# Patient Record
Sex: Female | Born: 1960 | Race: Black or African American | Hispanic: No | Marital: Married | Smoking: Never smoker
Health system: Southern US, Community
[De-identification: ages and names within clinical notes are randomized; demographics above are authoritative.]

## PROBLEM LIST (undated history)

## (undated) DIAGNOSIS — I10 Essential (primary) hypertension: Secondary | ICD-10-CM

## (undated) DIAGNOSIS — K219 Gastro-esophageal reflux disease without esophagitis: Secondary | ICD-10-CM

## (undated) DIAGNOSIS — H9190 Unspecified hearing loss, unspecified ear: Secondary | ICD-10-CM

## (undated) DIAGNOSIS — R1032 Left lower quadrant pain: Secondary | ICD-10-CM

## (undated) DIAGNOSIS — E079 Disorder of thyroid, unspecified: Secondary | ICD-10-CM

## (undated) DIAGNOSIS — J45909 Unspecified asthma, uncomplicated: Secondary | ICD-10-CM

## (undated) DIAGNOSIS — T4145XA Adverse effect of unspecified anesthetic, initial encounter: Secondary | ICD-10-CM

## (undated) DIAGNOSIS — T8859XA Other complications of anesthesia, initial encounter: Secondary | ICD-10-CM

## (undated) DIAGNOSIS — F32A Depression, unspecified: Secondary | ICD-10-CM

## (undated) DIAGNOSIS — E119 Type 2 diabetes mellitus without complications: Secondary | ICD-10-CM

## (undated) DIAGNOSIS — M1990 Unspecified osteoarthritis, unspecified site: Secondary | ICD-10-CM

## (undated) HISTORY — DX: Unspecified asthma, uncomplicated: J45.909

## (undated) HISTORY — DX: Type 2 diabetes mellitus without complications: E11.9

## (undated) HISTORY — DX: Unspecified hearing loss, unspecified ear: H91.90

## (undated) HISTORY — DX: Essential (primary) hypertension: I10

## (undated) HISTORY — PX: CARDIAC CATHETERIZATION: SHX172

## (undated) HISTORY — DX: Disorder of thyroid, unspecified: E07.9

---

## 2001-02-23 ENCOUNTER — Emergency Department (HOSPITAL_COMMUNITY): Admission: EM | Admit: 2001-02-23 | Discharge: 2001-02-23 | Payer: Self-pay | Admitting: Emergency Medicine

## 2001-11-13 ENCOUNTER — Other Ambulatory Visit: Admission: RE | Admit: 2001-11-13 | Discharge: 2001-11-13 | Payer: Self-pay | Admitting: Obstetrics and Gynecology

## 2001-11-18 ENCOUNTER — Encounter: Payer: Self-pay | Admitting: Obstetrics and Gynecology

## 2001-11-18 ENCOUNTER — Ambulatory Visit (HOSPITAL_COMMUNITY): Admission: RE | Admit: 2001-11-18 | Discharge: 2001-11-18 | Payer: Self-pay | Admitting: Obstetrics and Gynecology

## 2003-05-09 ENCOUNTER — Encounter: Payer: Self-pay | Admitting: Emergency Medicine

## 2003-05-09 ENCOUNTER — Emergency Department (HOSPITAL_COMMUNITY): Admission: EM | Admit: 2003-05-09 | Discharge: 2003-05-09 | Payer: Self-pay | Admitting: Emergency Medicine

## 2004-02-29 ENCOUNTER — Emergency Department (HOSPITAL_COMMUNITY): Admission: EM | Admit: 2004-02-29 | Discharge: 2004-03-01 | Payer: Self-pay | Admitting: Emergency Medicine

## 2005-03-11 ENCOUNTER — Emergency Department (HOSPITAL_COMMUNITY): Admission: EM | Admit: 2005-03-11 | Discharge: 2005-03-11 | Payer: Self-pay | Admitting: Emergency Medicine

## 2006-05-24 ENCOUNTER — Emergency Department (HOSPITAL_COMMUNITY): Admission: EM | Admit: 2006-05-24 | Discharge: 2006-05-25 | Payer: Self-pay | Admitting: Emergency Medicine

## 2006-06-19 ENCOUNTER — Encounter: Admission: RE | Admit: 2006-06-19 | Discharge: 2006-06-19 | Payer: Self-pay | Admitting: Orthopedic Surgery

## 2008-06-16 ENCOUNTER — Encounter: Admission: RE | Admit: 2008-06-16 | Discharge: 2008-06-16 | Payer: Self-pay | Admitting: Family Medicine

## 2010-11-27 ENCOUNTER — Encounter: Payer: Self-pay | Admitting: Orthopedic Surgery

## 2011-01-25 ENCOUNTER — Other Ambulatory Visit: Payer: Self-pay | Admitting: Family Medicine

## 2011-01-25 DIAGNOSIS — Z1231 Encounter for screening mammogram for malignant neoplasm of breast: Secondary | ICD-10-CM

## 2011-02-09 ENCOUNTER — Ambulatory Visit
Admission: RE | Admit: 2011-02-09 | Discharge: 2011-02-09 | Disposition: A | Discharge status: Home / Self Care | Payer: No Typology Code available for payment source | Source: Ambulatory Visit | Attending: Family Medicine | Admitting: Family Medicine

## 2011-02-09 DIAGNOSIS — Z1231 Encounter for screening mammogram for malignant neoplasm of breast: Secondary | ICD-10-CM

## 2011-02-13 ENCOUNTER — Ambulatory Visit: Payer: Self-pay | Admitting: *Deleted

## 2011-02-21 ENCOUNTER — Encounter: Payer: No Typology Code available for payment source | Attending: General Surgery | Admitting: *Deleted

## 2011-02-21 DIAGNOSIS — E669 Obesity, unspecified: Secondary | ICD-10-CM | POA: Insufficient documentation

## 2011-02-21 DIAGNOSIS — Z713 Dietary counseling and surveillance: Secondary | ICD-10-CM | POA: Insufficient documentation

## 2011-04-04 ENCOUNTER — Ambulatory Visit: Payer: No Typology Code available for payment source | Admitting: *Deleted

## 2011-04-10 ENCOUNTER — Encounter: Payer: No Typology Code available for payment source | Attending: General Surgery | Admitting: *Deleted

## 2011-04-10 DIAGNOSIS — Z713 Dietary counseling and surveillance: Secondary | ICD-10-CM | POA: Insufficient documentation

## 2011-04-10 DIAGNOSIS — E669 Obesity, unspecified: Secondary | ICD-10-CM | POA: Insufficient documentation

## 2012-01-17 ENCOUNTER — Encounter: Payer: Self-pay | Admitting: Internal Medicine

## 2012-01-17 ENCOUNTER — Ambulatory Visit (INDEPENDENT_AMBULATORY_CARE_PROVIDER_SITE_OTHER): Payer: No Typology Code available for payment source | Admitting: Internal Medicine

## 2012-01-17 VITALS — BP 160/100 | HR 72 | Temp 97.5°F | Ht 64.0 in | Wt 248.4 lb

## 2012-01-17 DIAGNOSIS — R0609 Other forms of dyspnea: Secondary | ICD-10-CM

## 2012-01-17 DIAGNOSIS — R0600 Dyspnea, unspecified: Secondary | ICD-10-CM

## 2012-01-17 NOTE — Progress Notes (Signed)
Subjective:    Patient ID: Rhonda Morrow, female    DOB: Feb 23, 1961, 51 y.o.   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 Here with interpreter.  reports that she has never smoked. She does not have any smokeless tobacco history on file. Body mass index is 42.64 kg/(m^2). Husband is patient of Dr Shelle Iron and has O2 dependency though is never smoker. She works in [employer]   Reports dyspnea. Especially with exertion walking or hurrying and walking and climbing steps. Stops half way through steps. Dyspnea also made worse by sitting in small cars. No dyspnea for eating or change of clothes. Relieved by rest and drinking water or sitting in large SUV/Van. Feels air hungry. INsidious onset. Slowly progressive. Says people can hear her dyspneic. Associated symptoms: 20# weight gain x 1 year,  occasional cough only during URI, wheezing, popping sounds occasionally in ear, hx of occasional URI and chest tightness. Says chest tightness improved by prevacid. Uses 3 pillows to sleep for years. Unclear if there is  paroxysmal nocturnal dyspnea or not. 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 Denies family hx of asthma. No pet birds. Has dog outside. No cats. No mold exposure but maybe in bathtub (not sure)  Walking desaturation test 185 feet x 3 laps"     Past Medical History  Diagnosis Date  . Pre-diabetes   . Hypertension   . Deaf      Family History  Problem Relation Age of Onset  . Diabetes Mother   . Diabetes Father   . Heart disease Mother      History   Social History  . Marital Status: Married    Spouse Name: N/A    Number of Children: N/A  . Years of Education: N/A   Occupational History  . diabled    Social History Main Topics  . Smoking status: Never Smoker   . Smokeless tobacco: Not on file  . Alcohol Use: No  . Drug Use: No  . Sexually Active: Not on  file   Other Topics Concern  . Not on file   Social History Narrative  . No narrative on file     No Known Allergies   No outpatient prescriptions prior to visit.        Review of Systems  Constitutional: Negative for fever and unexpected weight change.  HENT: Negative for ear pain, nosebleeds, congestion, sore throat, rhinorrhea, sneezing, trouble swallowing, dental problem, postnasal drip and sinus pressure.   Eyes: Negative for redness and itching.  Respiratory: Positive for cough, chest tightness and shortness of breath. Negative for wheezing.   Cardiovascular: Negative for palpitations and leg swelling.  Gastrointestinal: Negative for nausea and vomiting.  Genitourinary: Negative for dysuria.  Musculoskeletal: Negative for joint swelling.  Skin: Negative for rash.  Neurological: Negative for headaches.  Hematological: Does not bruise/bleed easily.  Psychiatric/Behavioral: Negative for dysphoric mood. The patient is not nervous/anxious.        Objective:   Physical Exam  Vitals reviewed. Constitutional: She is oriented to person, place, and time. She appears well-developed and well-nourished. No distress.       Obese Body mass index is 42.64 kg/(m^2).   HENT:  Head: Normocephalic and atraumatic.  Right Ear: External ear normal.  Left Ear: External ear normal.  Mouth/Throat: Oropharynx is clear and moist. No oropharyngeal exudate.  Eyes: Conjunctivae and EOM are normal. Pupils are equal, round, and reactive to light. Right eye exhibits no discharge. Left eye exhibits no discharge. No scleral icterus.  Neck: Normal range of motion. Neck supple. No JVD present. No tracheal deviation present. No thyromegaly present.  Cardiovascular: Normal rate, regular rhythm, normal heart sounds and intact distal pulses.  Exam reveals no gallop and no friction rub.   No murmur heard. Pulmonary/Chest: Effort normal and breath sounds normal. No respiratory distress. She has no  wheezes. She has no rales. She exhibits no tenderness.  Abdominal: Soft. Bowel sounds are normal. She exhibits no distension and no mass. There is no tenderness. There is no rebound and no guarding.       obese  Musculoskeletal: Normal range of motion. She exhibits no edema and no tenderness.  Lymphadenopathy:    She has no cervical adenopathy.  Neurological: She is alert and oriented to person, place, and time. She has normal reflexes. No cranial nerve deficit. She exhibits normal muscle tone. Coordination normal.  Skin: Skin is warm and dry. No rash noted. She is not diaphoretic. No erythema. No pallor.  Psychiatric: She has a normal mood and affect. Her behavior is normal. Judgment and thought content normal.          Assessment & Plan:

## 2012-01-17 NOTE — Patient Instructions (Signed)
Do not know why you are short of breath Please have breathing test called PFT BAsed on those test results I will call you and decide next step

## 2012-01-24 ENCOUNTER — Ambulatory Visit (INDEPENDENT_AMBULATORY_CARE_PROVIDER_SITE_OTHER): Payer: No Typology Code available for payment source | Admitting: Internal Medicine

## 2012-01-24 DIAGNOSIS — R0609 Other forms of dyspnea: Secondary | ICD-10-CM

## 2012-01-24 DIAGNOSIS — R0600 Dyspnea, unspecified: Secondary | ICD-10-CM

## 2012-01-24 LAB — PULMONARY FUNCTION TEST

## 2012-01-24 NOTE — Progress Notes (Signed)
PFT done today. 

## 2012-01-28 ENCOUNTER — Encounter: Payer: Self-pay | Admitting: Internal Medicine

## 2012-01-28 DIAGNOSIS — R0600 Dyspnea, unspecified: Secondary | ICD-10-CM | POA: Insufficient documentation

## 2012-01-28 NOTE — Assessment & Plan Note (Signed)
Do not know why you are short of breath Please have breathing test called PFT Based on those test results I will call you and decide next step

## 2012-01-31 ENCOUNTER — Telehealth: Payer: Self-pay | Admitting: Internal Medicine

## 2012-01-31 DIAGNOSIS — R0600 Dyspnea, unspecified: Secondary | ICD-10-CM

## 2012-01-31 DIAGNOSIS — R942 Abnormal results of pulmonary function studies: Secondary | ICD-10-CM

## 2012-01-31 NOTE — Telephone Encounter (Signed)
PFt date 01/24/12 shows mixed obstruction - restriction but greater restriction with low dlco   - fev1 1/48L/58%, Ratio 82, TLC 56,  DLCO 14/51%, No BD response  Please set up CT chest to assess for ILD and then followup

## 2012-02-01 NOTE — Telephone Encounter (Signed)
LMTCBx1 via relay. Carron Curie, CMA

## 2012-02-06 ENCOUNTER — Other Ambulatory Visit: Payer: No Typology Code available for payment source

## 2012-02-09 ENCOUNTER — Ambulatory Visit (INDEPENDENT_AMBULATORY_CARE_PROVIDER_SITE_OTHER)
Admission: RE | Admit: 2012-02-09 | Discharge: 2012-02-09 | Disposition: A | Discharge status: Home / Self Care | Payer: No Typology Code available for payment source | Source: Ambulatory Visit | Attending: Internal Medicine | Admitting: Internal Medicine

## 2012-02-09 DIAGNOSIS — R942 Abnormal results of pulmonary function studies: Secondary | ICD-10-CM

## 2012-02-09 DIAGNOSIS — R0600 Dyspnea, unspecified: Secondary | ICD-10-CM

## 2012-02-09 DIAGNOSIS — R0609 Other forms of dyspnea: Secondary | ICD-10-CM

## 2012-02-09 DIAGNOSIS — R0989 Other specified symptoms and signs involving the circulatory and respiratory systems: Secondary | ICD-10-CM

## 2012-02-12 NOTE — Telephone Encounter (Signed)
Pt aware. Rhonda Morrow, CMA  

## 2012-02-27 ENCOUNTER — Telehealth: Payer: Self-pay | Admitting: Internal Medicine

## 2012-02-27 NOTE — Telephone Encounter (Signed)
I LMTCBx1. I spoke with pt mother and advised of CT results and appt was set for 03-11-12 at 10:30am. Need to advise the pt as well. Carron Curie, CMA

## 2012-02-27 NOTE — Telephone Encounter (Signed)
I spoke with the pt and she is also aware of results and appt time and date.Carron Curie, CMA

## 2012-03-11 ENCOUNTER — Ambulatory Visit (INDEPENDENT_AMBULATORY_CARE_PROVIDER_SITE_OTHER): Payer: No Typology Code available for payment source | Admitting: Internal Medicine

## 2012-03-11 ENCOUNTER — Encounter: Payer: Self-pay | Admitting: Internal Medicine

## 2012-03-11 VITALS — BP 130/76 | HR 84 | Temp 97.5°F | Ht 64.0 in | Wt 253.8 lb

## 2012-03-11 DIAGNOSIS — R0609 Other forms of dyspnea: Secondary | ICD-10-CM

## 2012-03-11 DIAGNOSIS — R0600 Dyspnea, unspecified: Secondary | ICD-10-CM

## 2012-03-11 NOTE — Patient Instructions (Signed)
Please have methacholine challenge test for asthma We will call you with results If test positive, you will come in to start asthma treatment IF test negative, will refer you to pulmonary rehab for weight related shortness of breath  

## 2012-03-11 NOTE — Progress Notes (Signed)
Subjective:    Patient ID: Rhonda Morrow, female    DOB: [birth year], 51 y.o.   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 Here with interpreter.  reports that she has never smoked. She does not have any smokeless tobacco history on file. Body mass index is 42.64 kg/(m^2). Husband is patient of Dr Shelle Iron and has O2 dependency though is never smoker. She works in [employer]   Reports dyspnea. Especially with exertion walking or hurrying and walking and climbing steps. Stops half way through steps. Dyspnea also made worse by sitting in small cars. No dyspnea for eating or change of clothes. Relieved by rest and drinking water or sitting in large SUV/Van. Feels air hungry. INsidious onset. Slowly progressive. Says people can hear her dyspneic. Associated symptoms: 20# weight gain x 1 year,  occasional cough only during URI, wheezing, popping sounds occasionally in ear, hx of occasional URI and chest tightness. Says chest tightness improved by prevacid. Uses 3 pillows to sleep for years. Unclear if there is  paroxysmal nocturnal dyspnea or not. 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 Denies family hx of asthma. No pet birds. Has dog outside. No cats. No mold exposure but maybe in bathtub (not sure)  Walking desaturation test 185 feet x 3 laps"   REC Do not know why you are short of breath  Please have breathing test called PFT  BAsed on those test results I will call you and decide next step   OV 03/11/2012 Here to review test results. Interpreter is Rebecca [place]; patient deaf and mute  PFt date 01/24/12 shows mixed obstruction - restriction but greater restriction with low dlco   - fev1 1/48L/58%, Ratio 82, TLC 56,  DLCO 14/51%, No BD response  CT chest 02/09/12:  The ct chest does not show any scarring.     In terms of symptoms: current wet, cold, rainy weather bothering her  but only mildly - more sneezing. IN terms of dyspnea: still present especially with stairs, walking fast, being in a rush. Improved with rest and drinking water. Slowly getting worse. DEnies current cough. No other new problems. Also, feeling of fullness and dyspnea after eating. There is also some chest tightnes and hurt when yawnining; improves with water. Lot of questions on why she is dyspneic incluing if obesity is contributing.   Past, Family, Social reviewed: no change since last visit     Review of Systems  Constitutional: Negative for fever and unexpected weight change.  HENT: Negative for ear pain, nosebleeds, congestion, sore throat, rhinorrhea, sneezing, trouble swallowing, dental problem, postnasal drip and sinus pressure.   Eyes: Negative for redness and itching.  Respiratory: Positive for shortness of breath. Negative for cough, chest tightness and wheezing.   Cardiovascular: Negative for palpitations and leg swelling.  Gastrointestinal: Negative for nausea and vomiting.  Genitourinary: Negative for dysuria.  Musculoskeletal: Negative for joint swelling.  Skin: Negative for rash.  Neurological: Negative for headaches.  Hematological: Does not bruise/bleed easily.  Psychiatric/Behavioral: Negative for dysphoric mood. The patient is not nervous/anxious.        Objective:   Physical Exam Vitals reviewed. Constitutional: She is oriented to person, place, and time. She appears well-developed and well-nourished. No distress.       Obese Body mass index is 42.64 kg/(m^2).   HENT:  Head: Normocephalic and atraumatic.  Right Ear: External ear normal.  Left Ear: External ear normal.  Mouth/Throat: Oropharynx is clear and moist. No oropharyngeal exudate.  Eyes: Conjunctivae and EOM are normal. Pupils are equal, round, and reactive to light. Right eye exhibits no discharge. Left eye exhibits no discharge. No scleral icterus.  Neck: Normal range of motion. Neck supple. No JVD  present. No tracheal deviation present. No thyromegaly present.  Cardiovascular: Normal rate, regular rhythm, normal heart sounds and intact distal pulses.  Exam reveals no gallop and no friction rub.   No murmur heard. Pulmonary/Chest: Effort normal and breath sounds normal. No respiratory distress. She has no wheezes. She has no rales. She exhibits no tenderness.  Abdominal: Soft. Bowel sounds are normal. She exhibits no distension and no mass. There is no tenderness. There is no rebound and no guarding.       obese  Musculoskeletal: Normal range of motion. She exhibits no edema and no tenderness.  Lymphadenopathy:    She has no cervical adenopathy.  Neurological: She is alert and oriented to person, place, and time. She has normal reflexes. No cranial nerve deficit. She exhibits normal muscle tone. Coordination normal.  Skin: Skin is warm and dry. No rash noted. She is not diaphoretic. No erythema. No pallor.  Psychiatric: She has a normal mood and affect. Her behavior is normal. Judgment and thought content normal.           Assessment & Plan:

## 2012-03-14 NOTE — Assessment & Plan Note (Signed)
Please have methacholine challenge test for asthma We will call you with results If test positive, you will come in to start asthma treatment IF test negative, will refer you to pulmonary rehab for weight related shortness of breath

## 2012-03-19 ENCOUNTER — Ambulatory Visit (HOSPITAL_COMMUNITY)
Admission: RE | Admit: 2012-03-19 | Discharge: 2012-03-19 | Disposition: A | Discharge status: Home / Self Care | Payer: No Typology Code available for payment source | Source: Ambulatory Visit | Attending: Internal Medicine | Admitting: Internal Medicine

## 2012-03-19 DIAGNOSIS — R0600 Dyspnea, unspecified: Secondary | ICD-10-CM

## 2012-03-19 DIAGNOSIS — R0609 Other forms of dyspnea: Secondary | ICD-10-CM | POA: Insufficient documentation

## 2012-03-19 DIAGNOSIS — R0989 Other specified symptoms and signs involving the circulatory and respiratory systems: Secondary | ICD-10-CM | POA: Insufficient documentation

## 2012-03-19 LAB — PULMONARY FUNCTION TEST

## 2012-03-19 MED ORDER — SODIUM CHLORIDE 0.9 % IN NEBU
3.0000 mL | INHALATION_SOLUTION | Freq: Once | RESPIRATORY_TRACT | Status: AC
  Administered 2012-03-19: 3 mL via RESPIRATORY_TRACT

## 2012-03-19 MED ORDER — METHACHOLINE 16 MG/ML NEB SOLN: 2.0000 mL | Freq: Once | RESPIRATORY_TRACT | Status: DC

## 2012-03-19 MED ORDER — METHACHOLINE 0.25 MG/ML NEB SOLN
2.0000 mL | Freq: Once | RESPIRATORY_TRACT | Status: AC
  Administered 2012-03-19: 0.5 mg via RESPIRATORY_TRACT

## 2012-03-19 MED ORDER — METHACHOLINE 4 MG/ML NEB SOLN: 2.0000 mL | Freq: Once | RESPIRATORY_TRACT | Status: DC

## 2012-03-19 MED ORDER — ALBUTEROL SULFATE (5 MG/ML) 0.5% IN NEBU
2.5000 mg | INHALATION_SOLUTION | Freq: Once | RESPIRATORY_TRACT | Status: AC
  Administered 2012-03-19: 2.5 mg via RESPIRATORY_TRACT

## 2012-03-19 MED ORDER — METHACHOLINE 0.0625 MG/ML NEB SOLN
2.0000 mL | Freq: Once | RESPIRATORY_TRACT | Status: AC
  Administered 2012-03-19: 0.125 mg via RESPIRATORY_TRACT

## 2012-03-19 MED ORDER — METHACHOLINE 1 MG/ML NEB SOLN: 2.0000 mL | Freq: Once | RESPIRATORY_TRACT | Status: DC

## 2012-04-02 ENCOUNTER — Telehealth: Payer: Self-pay | Admitting: Internal Medicine

## 2012-04-02 NOTE — Telephone Encounter (Signed)
pls schedule appt first available  (max 12-14 per day) to see me to discuss positive methachole challenge test ofr 03/19/12   Test details for my perusal   - test done 03/19/12 - good cooperation per RT. 24% Fev1 drop and 17% FVC dro[[ at dose 0/25

## 2012-04-03 NOTE — Telephone Encounter (Signed)
Pt scheduled for 04-18-12. Rhonda Morrow, CMA

## 2012-04-03 NOTE — Telephone Encounter (Signed)
lmomtcb x1 

## 2012-04-03 NOTE — Telephone Encounter (Signed)
LMTCBx1.Astaria Nanez, CMA  

## 2012-04-03 NOTE — Telephone Encounter (Signed)
Pt returned call.  Call back @ [phone] Leanora Ivanoff

## 2012-04-18 ENCOUNTER — Encounter: Payer: Self-pay | Admitting: Internal Medicine

## 2012-04-18 ENCOUNTER — Ambulatory Visit (INDEPENDENT_AMBULATORY_CARE_PROVIDER_SITE_OTHER): Payer: No Typology Code available for payment source | Admitting: Internal Medicine

## 2012-04-18 VITALS — BP 132/88 | HR 80 | Temp 97.6°F | Ht 64.0 in | Wt 251.6 lb

## 2012-04-18 DIAGNOSIS — R0600 Dyspnea, unspecified: Secondary | ICD-10-CM

## 2012-04-18 DIAGNOSIS — E669 Obesity, unspecified: Secondary | ICD-10-CM

## 2012-04-18 DIAGNOSIS — J4530 Mild persistent asthma, uncomplicated: Secondary | ICD-10-CM

## 2012-04-18 DIAGNOSIS — R0609 Other forms of dyspnea: Secondary | ICD-10-CM

## 2012-04-18 DIAGNOSIS — R0989 Other specified symptoms and signs involving the circulatory and respiratory systems: Secondary | ICD-10-CM

## 2012-04-18 DIAGNOSIS — J45909 Unspecified asthma, uncomplicated: Secondary | ICD-10-CM

## 2012-04-18 MED ORDER — BUDESONIDE-FORMOTEROL FUMARATE 80-4.5 MCG/ACT IN AERO: 2.0000 | INHALATION_SPRAY | Freq: Two times a day (BID) | RESPIRATORY_TRACT | Status: DC

## 2012-04-18 NOTE — Progress Notes (Signed)
Subjective:    Patient ID: Rhonda Morrow, female    DOB: 06-18-1961, 50 y.o.   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 Here with interpreter.  reports that she has never smoked. She does not have any smokeless tobacco history on file. Body mass index is 42.64 kg/(m^2). Husband is patient of Dr Shelle Iron and has O2 dependency though is never smoker. She works in [employer]   Reports dyspnea. Especially with exertion walking or hurrying and walking and climbing steps. Stops half way through steps. Dyspnea also made worse by sitting in small cars. No dyspnea for eating or change of clothes. Relieved by rest and drinking water or sitting in large SUV/Van. Feels air hungry. INsidious onset. Slowly progressive. Says people can hear her dyspneic. Associated symptoms: 20# weight gain x 1 year,  occasional cough only during URI, wheezing, popping sounds occasionally in ear, hx of occasional URI and chest tightness. Says chest tightness improved by prevacid. Uses 3 pillows to sleep for years. Unclear if there is  paroxysmal nocturnal dyspnea or not. 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 Denies family hx of asthma. No pet birds. Has dog outside. No cats. No mold exposure but maybe in bathtub (not sure)  Walking desaturation test 185 feet x 3 laps"   REC Do not know why you are short of breath  Please have breathing test called PFT  BAsed on those test results I will call you and decide next step   OV 03/11/2012 Here to review test results. Interpreter is Rebecca [place]; patient deaf and mute  PFt date 01/24/12 shows mixed obstruction - restriction but greater restriction with low dlco   - fev1 1/48L/58%, Ratio 82, TLC 56,  DLCO 14/51%, No BD response  CT chest 02/09/12:  The ct chest does not show any scarring.     In terms of symptoms: current wet, cold, rainy weather bothering her  but only mildly - more sneezing. IN terms of dyspnea: still present especially with stairs, walking fast, being in a rush. Improved with rest and drinking water. Slowly getting worse. DEnies current cough. No other new problems. Also, feeling of fullness and dyspnea after eating. There is also some chest tightnes and hurt when yawnining; improves with water. Lot of questions on why she is dyspneic incluing if obesity is contributing.   Past, Family, Social reviewed: no change since last visit   REC  Please have methacholine challenge test for asthma  We will call you with results  If test positive, you will come in to start asthma treatment  IF test negative, will refer you to pulmonary rehab for weight related shortness of breath   OV 04/18/2012  Here to discuss results of methacholine challenge.No interim complaints. INterepreter is Lurena Joiner  positive methachole challenge test ofr 03/19/12  - good cooperation per RT. 24% Fev1 drop and 17% FVC drop at dose 0.25    Current outpatient prescriptions:naproxen (NAPROSYN) 500 MG tablet, Take 1 tablet by mouth Twice daily as needed., Disp: , Rfl: ;  naproxen sodium (ANAPROX) 220 MG tablet, Take 220 mg by mouth 2 (two) times daily with a meal., Disp: , Rfl:      Review of Systems  Constitutional: Negative for fever and unexpected weight change.  HENT: Negative for ear pain, nosebleeds, congestion, sore throat, rhinorrhea, sneezing, trouble swallowing, dental problem, postnasal  drip and sinus pressure.   Eyes: Negative for redness and itching.  Respiratory: Positive for shortness of breath. Negative for cough, chest tightness and wheezing.   Cardiovascular: Negative for palpitations and leg swelling.  Gastrointestinal: Negative for nausea and vomiting.  Genitourinary: Negative for dysuria.  Musculoskeletal: Negative for joint swelling.  Skin: Negative for rash.  Neurological: Negative for headaches.  Hematological: Does not bruise/bleed  easily.  Psychiatric/Behavioral: Negative for dysphoric mood. The patient is not nervous/anxious.        Objective:   Physical Exam Vitals reviewed. Constitutional: She is oriented to person, place, and time. She appears well-developed and well-nourished. No distress.       Obese Body mass index is 42.64 kg/(m^2).   HENT: Psychiatric: She has a normal mood and affect. Her behavior is normal. Judgment and thought content normal.            Assessment & Plan:

## 2012-04-18 NOTE — Patient Instructions (Addendum)
#  Shortness of breath  - this is due to asthma and weight  - for asthma Please start symbicort 80/4.5 2 puff twice daily - take sample, and show technique (script with refills done)  - I have referred you to pulmonary rehabilitation for shortness of breath exercises  #Weight management - I will discuss diet with you next visit  #Followup 6-8 weeks or sooner if needed

## 2012-04-19 ENCOUNTER — Encounter: Payer: Self-pay | Admitting: Internal Medicine

## 2012-04-19 DIAGNOSIS — J4540 Moderate persistent asthma, uncomplicated: Secondary | ICD-10-CM | POA: Insufficient documentation

## 2012-04-19 NOTE — Assessment & Plan Note (Signed)
Start symbicort

## 2012-04-19 NOTE — Assessment & Plan Note (Signed)
#  Shortness of breath  - this is due to asthma and weight  - for asthma Please start symbicort 80/4.5 2 puff twice daily - take sample, and show technique (script with refills done)  - I have referred you to pulmonary rehabilitation for shortness of breath exercises  #Weight management - I will discuss diet with you next visit  #Followup 6-8 weeks or sooner if needed  

## 2012-05-22 ENCOUNTER — Encounter: Payer: Self-pay | Admitting: Family Medicine

## 2012-05-22 ENCOUNTER — Ambulatory Visit (INDEPENDENT_AMBULATORY_CARE_PROVIDER_SITE_OTHER): Payer: No Typology Code available for payment source | Admitting: Family Medicine

## 2012-05-22 VITALS — BP 135/75 | HR 73 | Temp 98.5°F | Ht 63.25 in | Wt 246.0 lb

## 2012-05-22 DIAGNOSIS — M549 Dorsalgia, unspecified: Secondary | ICD-10-CM

## 2012-05-22 DIAGNOSIS — E049 Nontoxic goiter, unspecified: Secondary | ICD-10-CM

## 2012-05-22 DIAGNOSIS — E010 Iodine-deficiency related diffuse (endemic) goiter: Secondary | ICD-10-CM

## 2012-05-22 DIAGNOSIS — R7309 Other abnormal glucose: Secondary | ICD-10-CM

## 2012-05-22 DIAGNOSIS — J45909 Unspecified asthma, uncomplicated: Secondary | ICD-10-CM

## 2012-05-22 DIAGNOSIS — J4530 Mild persistent asthma, uncomplicated: Secondary | ICD-10-CM

## 2012-05-22 MED ORDER — ALBUTEROL SULFATE HFA 108 (90 BASE) MCG/ACT IN AERS: 2.0000 | INHALATION_SPRAY | Freq: Four times a day (QID) | RESPIRATORY_TRACT | Status: DC | PRN

## 2012-05-22 MED ORDER — CYCLOBENZAPRINE HCL 10 MG PO TABS: 10.0000 mg | ORAL_TABLET | Freq: Three times a day (TID) | ORAL | Status: DC | PRN

## 2012-05-22 NOTE — Patient Instructions (Addendum)
We'll call you with your ultrasound appt and your ortho appt Start the Albuterol inhaler- 2 puffs as needed for shortness of breath or wheezing Continue the Symbicort Use the flexeril (muscle relaxer) at night for your back pain Once we review your records from GYN we'll determine when you need to follow up Call with any questions or concerns Welcome!  We're glad to have you!!!

## 2012-05-22 NOTE — Assessment & Plan Note (Signed)
New.  Pt's pain seems to be more muscular than bony but difficult to get accurate history.  Start flexeril as pt reports pain is worst when lying down.  Continue NSAIDs.  Refer to ortho.  Reviewed supportive care and red flags that should prompt return.  Pt expressed understanding and is in agreement w/ plan.

## 2012-05-22 NOTE — Assessment & Plan Note (Signed)
New to provider.  Following w/ pulm.  Still complains of SOB w/ activity.  Stressed importance of regular exercise to improve physical conditioning.  Will start rescue inhaler for prn use.

## 2012-05-22 NOTE — Assessment & Plan Note (Signed)
New.  Pt has upcoming appt w/ Endo at request of GYN.  Attempting to get recent labs.  Will follow along and assist as able.

## 2012-05-22 NOTE — Progress Notes (Signed)
  Subjective:    Patient ID: Rhonda Morrow, female    DOB: 1961/03/15, 51 y.o.   MRN: [id number]  HPI New to establish.  No previous PCP.  Has appt w/ Dr Everardo All next week b/c sugar was elevated at GYN.  Pulm- Ramaswamy.  GYN- Mody.  Asthma- chronic problem, on Symbicort 2 puffs BID.  No rescue inhaler.  But having SOB w/ climbing stairs or activity.  No appt upcoming.  Denies SOB at night.  Elevated glucose- found at GYN office.  Has appt upcoming w/ Dr Everardo All. Unclear if cholesterol was checked.  Not exercising.  Back pain- chronic problem, low back.  Sometimes brings tears.  Very uncomfortable to sleep.  Taking Aleve w/out relief.  Pain is intermittent but has been worsening.  Worse in the AM.  Pain will radiate up back.  Denies weakness of arms or legs.  Morning stiffness.  Worse when lying on her side- has to sleep on back.   Pain improves w/ sitting, walking.  Very difficult hx to obtain even w/ sign language interpreter present  Review of Systems For ROS see HPI     Objective:   Physical Exam  Vitals reviewed. Constitutional: She appears well-developed and well-nourished. No distress.       obese  HENT:  Head: Normocephalic and atraumatic.  Neck: Normal range of motion. Neck supple. Thyromegaly (diffuse fullness w/out nodularity) present.  Cardiovascular: Normal rate, regular rhythm, normal heart sounds and intact distal pulses.   No murmur heard. Pulmonary/Chest: Effort normal and breath sounds normal. No respiratory distress. She has no wheezes. She has no rales.  Abdominal: Soft. Bowel sounds are normal. She exhibits no distension. There is no tenderness.  Musculoskeletal: She exhibits no edema.       Tenderness to palpation of paraspinal muscles running length of spine (-) SLR bilaterally Normal gait  Lymphadenopathy:    She has no cervical adenopathy.  Neurological: She is alert. No cranial nerve deficit. Coordination normal.       deaf          Assessment & Plan:

## 2012-05-22 NOTE — Assessment & Plan Note (Signed)
New.  Pt not aware of this.  Get [place] to assess.  Review recent labs- get TSH if one not done.

## 2012-05-27 ENCOUNTER — Ambulatory Visit (INDEPENDENT_AMBULATORY_CARE_PROVIDER_SITE_OTHER): Payer: No Typology Code available for payment source | Admitting: Endocrinology

## 2012-05-27 ENCOUNTER — Encounter: Payer: Self-pay | Admitting: Endocrinology

## 2012-05-27 VITALS — BP 138/88 | HR 73 | Temp 98.3°F | Wt 246.0 lb

## 2012-05-27 DIAGNOSIS — R7309 Other abnormal glucose: Secondary | ICD-10-CM

## 2012-05-27 NOTE — Patient Instructions (Addendum)
Please sign a release of information for the blood tests done by dr mody. i'll call the relay operator when i have results.   (update:  Pt is called.  tsh is normal.  i'll await results of thyroid ultrasound.  A1c=6.4.  Some advise medication in this situation)

## 2012-05-27 NOTE — Progress Notes (Signed)
Subjective:    Patient ID: Rhonda Morrow, female    DOB: Apr 03, 1961, 51 y.o.   MRN: [id number]  HPI Pt says she was recently told her blood sugar was abnormal.   She also says she was noted to have thyromegaly.  She says in retrospect, she has a few mos of slight swelling at the anterior neck.  No assoc pain. Past Medical History  Diagnosis Date  . Pre-diabetes   . Hypertension   . Deaf   . Asthma     Past Surgical History  Procedure Date  . Cesarean section     History   Social History  . Marital Status: Married    Spouse Name: N/A    Number of Children: N/A  . Years of Education: N/A   Occupational History  . diabled    Social History Main Topics  . Smoking status: Never Smoker   . Smokeless tobacco: Not on file  . Alcohol Use: No  . Drug Use: No  . Sexually Active: Not on file   Other Topics Concern  . Not on file   Social History Narrative  . No narrative on file    Current Outpatient Prescriptions on File Prior to Visit  Medication Sig Dispense Refill  . albuterol (PROVENTIL HFA;VENTOLIN HFA) 108 (90 BASE) MCG/ACT inhaler Inhale 2 puffs into the lungs every 6 (six) hours as needed for wheezing.  1 Inhaler  0  . budesonide-formoterol (SYMBICORT) 80-4.5 MCG/ACT inhaler Inhale 2 puffs into the lungs 2 (two) times daily.  1 Inhaler  12  . norethindrone (AYGESTIN) 5 MG tablet Take 5 mg by mouth daily.         No Known Allergies  Family History  Problem Relation Age of Onset  . Diabetes Mother   . Diabetes Father   . Heart disease Mother   no goiter or other thyroid probs.    BP 138/88  Pulse 73  Temp 98.3 F (36.8 C) (Oral)  Wt 246 lb (111.585 kg)  SpO2 99%  LMP 05/20/2012  Review of Systems denies weight loss, headache, double vision, palpitations, diarrhea, polyuria, myalgias, numbness, tremor, anxiety, hypoglycemia, easy bruising, and rhinorrhea.  She is able to speak very little.  She has irreg menses, excessive diaphoresis, and doe.      Objective:   Physical Exam VS: see vs page GEN: no distress.  Obese. HEAD: head: no deformity eyes: no periorbital swelling, no proptosis external nose and ears are normal mouth: no lesion seen NECK: supple, thyroid is not enlarged CHEST WALL: no deformity LUNGS:  Clear to auscultation CV: reg rate and rhythm, no murmur ABD: abdomen is soft, nontender.  no hepatosplenomegaly.  not distended.  no hernia MUSCULOSKELETAL: muscle bulk and strength are grossly normal.  no obvious joint swelling.  gait is normal and steady EXTEMITIES: no deformity.  no ulcer on the feet.  feet are of normal color and temp.  no edema PULSES: dorsalis pedis intact bilat.  no carotid bruit NEURO:  cn 2-12 grossly intact.   readily moves all 4's.  sensation is intact to touch on the feet.  No tremor.   SKIN:  Normal texture and temperature.  No rash or suspicious lesion is visible.  Not diaphoretic. NODES:  None palpable at the neck.   PSYCH: alert, oriented x3.  Does not appear anxious nor depressed.    outside test results are reviewed: A1c=6.4% TSH=0.91    Assessment & Plan:  DM.  New Goiter as reported, uncertain etiology  irreg menses, not thyroid-related

## 2012-05-28 ENCOUNTER — Ambulatory Visit: Payer: No Typology Code available for payment source | Admitting: Internal Medicine

## 2012-05-28 ENCOUNTER — Telehealth: Payer: Self-pay | Admitting: Endocrinology

## 2012-05-28 NOTE — Telephone Encounter (Signed)
please call patient: Thyroid blood test is normal.  i'll look forward to the ultrasound result Blood sugar is 6.4.  At this level, some recommend medication, but you could also just work on your diet and exercise.

## 2012-05-29 ENCOUNTER — Encounter: Payer: Self-pay | Admitting: *Deleted

## 2012-05-29 ENCOUNTER — Telehealth: Payer: Self-pay | Admitting: *Deleted

## 2012-05-29 ENCOUNTER — Ambulatory Visit (HOSPITAL_BASED_OUTPATIENT_CLINIC_OR_DEPARTMENT_OTHER)
Admission: RE | Admit: 2012-05-29 | Discharge: 2012-05-29 | Disposition: A | Discharge status: Home / Self Care | Payer: No Typology Code available for payment source | Source: Ambulatory Visit | Attending: Family Medicine | Admitting: Family Medicine

## 2012-05-29 DIAGNOSIS — E010 Iodine-deficiency related diffuse (endemic) goiter: Secondary | ICD-10-CM

## 2012-05-29 DIAGNOSIS — R220 Localized swelling, mass and lump, head: Secondary | ICD-10-CM | POA: Insufficient documentation

## 2012-05-29 DIAGNOSIS — E049 Nontoxic goiter, unspecified: Secondary | ICD-10-CM | POA: Insufficient documentation

## 2012-05-29 DIAGNOSIS — E041 Nontoxic single thyroid nodule: Secondary | ICD-10-CM

## 2012-05-29 NOTE — Telephone Encounter (Signed)
Called pt to inform of lab results, left message for pt to callback office.  

## 2012-05-29 NOTE — Telephone Encounter (Signed)
Pt informed of results.

## 2012-05-29 NOTE — Telephone Encounter (Signed)
Placed referral in chart for ENT due to thyroid nodule Spoke to pt answering service that had pt verify DOB and message was relayed as to results of [place], pt reported understanding and is aware that someone will call her with the apt for the ENT asap, pt agreed. letter mailed to patients home address with results.

## 2012-06-11 ENCOUNTER — Telehealth: Payer: Self-pay | Admitting: *Deleted

## 2012-06-11 ENCOUNTER — Other Ambulatory Visit: Payer: Self-pay | Admitting: Otolaryngology

## 2012-06-11 DIAGNOSIS — E041 Nontoxic single thyroid nodule: Secondary | ICD-10-CM

## 2012-06-11 NOTE — Telephone Encounter (Signed)
Called to advise that pt is in office currently and needs to have copy of recent TSH, advised pt was new to establish on 05-22-12 however no TSH has been drawn by our office nor any epic office, advised pt noted labs with GYN Sandelson per pt on 05-12-12, Misty Stanley will follow up with GYN office, per our office did send ENT referral however unable to access any labs with GYN, Misty Stanley will contact GYN MD office to receive labs

## 2012-06-13 ENCOUNTER — Ambulatory Visit (INDEPENDENT_AMBULATORY_CARE_PROVIDER_SITE_OTHER): Payer: No Typology Code available for payment source | Admitting: Family Medicine

## 2012-06-13 ENCOUNTER — Encounter: Payer: Self-pay | Admitting: Family Medicine

## 2012-06-13 VITALS — BP 132/77 | HR 84 | Temp 97.8°F | Ht 63.5 in | Wt 247.8 lb

## 2012-06-13 DIAGNOSIS — J4530 Mild persistent asthma, uncomplicated: Secondary | ICD-10-CM

## 2012-06-13 DIAGNOSIS — M549 Dorsalgia, unspecified: Secondary | ICD-10-CM

## 2012-06-13 DIAGNOSIS — J45909 Unspecified asthma, uncomplicated: Secondary | ICD-10-CM

## 2012-06-13 MED ORDER — CYCLOBENZAPRINE HCL 10 MG PO TABS: 10.0000 mg | ORAL_TABLET | Freq: Three times a day (TID) | ORAL | Status: DC | PRN

## 2012-06-13 NOTE — Patient Instructions (Addendum)
Go see Ortho as scheduled on 8/16 for your back Start the Flexeril nightly- during the day as needed but it may cause drowsiness Continue the Aleve- 2 in the morning and 2 at night w/ food Continue to use a heating pad when you get home Call with any questions or concerns Hang in there!!!

## 2012-06-13 NOTE — Progress Notes (Signed)
  Subjective:    Patient ID: Rhonda Morrow, female    DOB: 1961/01/06, 51 y.o.   MRN: [id number]  HPI Back pain- continues since last visit.  There was some confusion re flexeril script- never picked this up.  Still having pain mid thoracic right down spine.  Described as a throbbing.  Does a lot of lifting at work.  Aleve w/ some relief.    Asthma- pt reports this has improved since adding rescue inhaler prn.  Very appreciative.   Review of Systems For ROS see HPI     Objective:   Physical Exam  Vitals reviewed. Constitutional: She appears well-developed and well-nourished. No distress.  HENT:  Head: Normocephalic and atraumatic.  Musculoskeletal: She exhibits no edema.       Tenderness to palpation of paraspinal muscles running length of spine (-) SLR bilaterally Normal gait           Assessment & Plan:

## 2012-06-18 ENCOUNTER — Ambulatory Visit
Admission: RE | Admit: 2012-06-18 | Discharge: 2012-06-18 | Disposition: A | Discharge status: Home / Self Care | Payer: No Typology Code available for payment source | Source: Ambulatory Visit | Attending: Otolaryngology | Admitting: Otolaryngology

## 2012-06-18 ENCOUNTER — Other Ambulatory Visit (HOSPITAL_COMMUNITY)
Admission: RE | Admit: 2012-06-18 | Discharge: 2012-06-18 | Disposition: A | Discharge status: Home / Self Care | Payer: No Typology Code available for payment source | Source: Ambulatory Visit | Attending: Diagnostic Radiology | Admitting: Diagnostic Radiology

## 2012-06-18 DIAGNOSIS — E041 Nontoxic single thyroid nodule: Secondary | ICD-10-CM

## 2012-06-18 DIAGNOSIS — E049 Nontoxic goiter, unspecified: Secondary | ICD-10-CM | POA: Insufficient documentation

## 2012-06-24 ENCOUNTER — Encounter: Payer: Self-pay | Admitting: Internal Medicine

## 2012-06-24 ENCOUNTER — Ambulatory Visit (INDEPENDENT_AMBULATORY_CARE_PROVIDER_SITE_OTHER): Payer: No Typology Code available for payment source | Admitting: Internal Medicine

## 2012-06-24 VITALS — BP 120/78 | HR 89 | Temp 97.9°F | Ht 64.0 in | Wt 247.2 lb

## 2012-06-24 DIAGNOSIS — R0609 Other forms of dyspnea: Secondary | ICD-10-CM

## 2012-06-24 DIAGNOSIS — J45909 Unspecified asthma, uncomplicated: Secondary | ICD-10-CM

## 2012-06-24 DIAGNOSIS — R0600 Dyspnea, unspecified: Secondary | ICD-10-CM

## 2012-06-24 DIAGNOSIS — R0989 Other specified symptoms and signs involving the circulatory and respiratory systems: Secondary | ICD-10-CM

## 2012-06-24 DIAGNOSIS — E669 Obesity, unspecified: Secondary | ICD-10-CM

## 2012-06-24 DIAGNOSIS — J4530 Mild persistent asthma, uncomplicated: Secondary | ICD-10-CM

## 2012-06-24 NOTE — Patient Instructions (Addendum)
#  ASthma  - Continue symbicort 80/4.5 2 puff twice daily    #shortness of breath and obesity  - I have emailed Rhonda Morrow at rehab and will get back to you about starting there in rehabreferred you to pulmonary rehabilitation for shortness of breath exercises  #obesity - follow [facility] low glycemic diet for weight loss    #Followup 4 weeks or sooner if needed

## 2012-06-24 NOTE — Progress Notes (Signed)
Subjective:    Patient ID: Rhonda Morrow, female    DOB: 1960-11-08, 51 y.o.   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 Here with interpreter.  reports that she has never smoked. She does not have any smokeless tobacco history on file. Body mass index is 42.64 kg/(m^2). Husband is patient of Dr Shelle Iron and has O2 dependency though is never smoker. She works in [employer]   Reports dyspnea. Especially with exertion walking or hurrying and walking and climbing steps. Stops half way through steps. Dyspnea also made worse by sitting in small cars. No dyspnea for eating or change of clothes. Relieved by rest and drinking water or sitting in large SUV/Van. Feels air hungry. INsidious onset. Slowly progressive. Says people can hear her dyspneic. Associated symptoms: 20# weight gain x 1 year,  occasional cough only during URI, wheezing, popping sounds occasionally in ear, hx of occasional URI and chest tightness. Says chest tightness improved by prevacid. Uses 3 pillows to sleep for years. Unclear if there is  paroxysmal nocturnal dyspnea or not. 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 Denies family hx of asthma. No pet birds. Has dog outside. No cats. No mold exposure but maybe in bathtub (not sure)  Walking desaturation test 185 feet x 3 laps"   REC Do not know why you are short of breath  Please have breathing test called PFT  BAsed on those test results I will call you and decide next step   OV 03/11/2012 Here to review test results. Interpreter is Rebecca [place]; patient deaf and mute  PFt date 01/24/12 shows mixed obstruction - restriction but greater restriction with low dlco   - fev1 1/48L/58%, Ratio 82, TLC 56,  DLCO 14/51%, No BD response  CT chest 02/09/12:  The ct chest does not show any scarring.     In terms of symptoms: current wet, cold, rainy weather bothering  her but only mildly - more sneezing. IN terms of dyspnea: still present especially with stairs, walking fast, being in a rush. Improved with rest and drinking water. Slowly getting worse. DEnies current cough. No other new problems. Also, feeling of fullness and dyspnea after eating. There is also some chest tightnes and hurt when yawnining; improves with water. Lot of questions on why she is dyspneic incluing if obesity is contributing.   Past, Family, Social reviewed: no change since last visit   REC  Please have methacholine challenge test for asthma  We will call you with results  If test positive, you will come in to start asthma treatment  IF test negative, will refer you to pulmonary rehab for weight related shortness of breath   OV 04/18/2012  Here to discuss results of methacholine challenge.No interim complaints. INterepreter is Lurena Joiner  positive methachole challenge test ofr 03/19/12  - good cooperation per RT. 24% Fev1 drop and 17% FVC drop at dose 0.25   #Shortness of breath  - this is due to asthma and weight  - for asthma Please start symbicort 80/4.5 2 puff twice daily - take sample, and show technique (script with refills done)  - I have referred you to pulmonary rehabilitation for shortness of breath exercises  #Weight management - I will discuss diet with you next visit  #Followup 6-8 weeks or sooner if needed    OV 06/24/2012 - followup dyspnea  on basis of asthma and obesity. Here with deaf mute intepreter.   - Reports improved dyspnea following starting symbicort > 90% compliance . Using albuterol for rescue only which she has used only occasionally; only once or two times per last week.  Feels she can sleep and rest better now but I am unable to sort out what is really better. Walking steps still a problem and in fact NOT improved. She is still though resorting to albuterol for rescue and drinking water. No associated weight loss. She has not attended rehab;  says Jamesetta So has not called back on phone calls.    - Obesity Body mass index is 42.43 kg/(m^2).: diet rviewed. Eating way too many high and medium glycemic foods. Eats very little low glycemic foods   Review of Systems  Constitutional: Negative for fever, chills, diaphoresis, activity change, appetite change, fatigue and unexpected weight change.  HENT: Positive for postnasal drip. Negative for nosebleeds, congestion, sore throat, rhinorrhea, trouble swallowing and tinnitus.   Eyes: Negative for visual disturbance.  Respiratory: Positive for chest tightness and shortness of breath. Negative for apnea, cough, choking, wheezing and stridor.   Cardiovascular: Negative for chest pain and palpitations.  Gastrointestinal: Negative for nausea, vomiting, constipation and blood in stool.  Genitourinary: Negative for difficulty urinating.  Musculoskeletal: Negative for myalgias, back pain, joint swelling, arthralgias and gait problem.  Skin: Negative for rash.  Neurological: Positive for headaches. Negative for dizziness, seizures, weakness, light-headedness and numbness.  Hematological: Does not bruise/bleed easily.  Psychiatric/Behavioral: Negative for confusion, disturbed wake/sleep cycle and agitation. The patient is not nervous/anxious.        Objective:   Physical Exam  Vitals reviewed. Constitutional: She is oriented to person, place, and time. She appears well-developed and well-nourished. No distress.       Obese Body mass index is 42.64 kg/(m^2).   HENT: Psychiatric: She has a normal mood and affect. Her behavior is normal. Judgment and thought content normal.   Speaks via interpreetr       Assessment & Plan:

## 2012-06-30 DIAGNOSIS — E669 Obesity, unspecified: Secondary | ICD-10-CM | POA: Insufficient documentation

## 2012-06-30 NOTE — Assessment & Plan Note (Signed)
Counseled extensively on choosing low glycemic foods, consturction of foods and meals based on glycemic index and tips to avoid high glycemic foods.  > 50% of this > 25 min visit spent in face to face counseling (15 min visit converted to 25 min)

## 2012-06-30 NOTE — Assessment & Plan Note (Signed)
Improved since adding rescue HFA.  Will continue to follow.

## 2012-06-30 NOTE — Assessment & Plan Note (Signed)
#  shortness of breath and obesity  - I have emailed Phyllis at rehab and will get back to you about starting there in rehabreferred you to pulmonary rehabilitation for shortness of breath exercises

## 2012-06-30 NOTE — Assessment & Plan Note (Signed)
#  ASthma  - Continue symbicort 80/4.5 2 puff twice daily     #Followup 4 weeks or sooner if needed

## 2012-06-30 NOTE — Assessment & Plan Note (Signed)
Unchanged.  Confusion w/ pharmacy over flexeril script.  Printed in office today and given to pt.  Reviewed supportive care and red flags that should prompt return.  Pt expressed understanding and is in agreement w/ plan.

## 2012-07-03 ENCOUNTER — Encounter (HOSPITAL_COMMUNITY): Payer: Self-pay

## 2012-07-03 ENCOUNTER — Encounter (HOSPITAL_COMMUNITY)
Admission: RE | Admit: 2012-07-03 | Discharge: 2012-07-03 | Disposition: A | Discharge status: Home / Self Care | Payer: No Typology Code available for payment source | Source: Ambulatory Visit | Attending: Internal Medicine | Admitting: Internal Medicine

## 2012-07-03 DIAGNOSIS — R0989 Other specified symptoms and signs involving the circulatory and respiratory systems: Secondary | ICD-10-CM | POA: Insufficient documentation

## 2012-07-03 DIAGNOSIS — R0609 Other forms of dyspnea: Secondary | ICD-10-CM | POA: Insufficient documentation

## 2012-07-03 DIAGNOSIS — Z5189 Encounter for other specified aftercare: Secondary | ICD-10-CM | POA: Insufficient documentation

## 2012-07-03 NOTE — Progress Notes (Signed)
Mrs. Ishee is here today with interpreter for orientation to [facility].  The patient was to check with her insurance regarding coverage prior to today.  On arrival, she informed me she had called and was told they did not have code for here.  This Clinical research associate called her insurance company and was informed  Pulmonary Rehab was not listed as a covered service.  With the interpreter we explained the program, the cost of the program, and billing.  She wants to start the program and is aware of the charges that will be billed to her.  Health history and medications were reviewed with patient.  Demonstration and practice of PLB using pulse oximeter.  Patient able to return demonstration satisfactorily. Safety and hand hygiene in the exercise area reviewed with patient.  Patient demonstrates understanding. She plans to start exercise on September 10th. Walk test done, oxygen saturations were stable 98%.

## 2012-07-04 ENCOUNTER — Encounter (HOSPITAL_COMMUNITY): Payer: No Typology Code available for payment source

## 2012-07-09 ENCOUNTER — Encounter: Payer: Self-pay | Admitting: Internal Medicine

## 2012-07-09 ENCOUNTER — Encounter (HOSPITAL_COMMUNITY): Payer: No Typology Code available for payment source

## 2012-07-10 ENCOUNTER — Emergency Department (HOSPITAL_COMMUNITY): Payer: No Typology Code available for payment source

## 2012-07-10 ENCOUNTER — Emergency Department (HOSPITAL_COMMUNITY)
Admission: EM | Admit: 2012-07-10 | Discharge: 2012-07-10 | Disposition: A | Discharge status: Home / Self Care | Payer: No Typology Code available for payment source | Attending: Emergency Medicine | Admitting: Emergency Medicine

## 2012-07-10 ENCOUNTER — Encounter (HOSPITAL_COMMUNITY): Payer: Self-pay | Admitting: Emergency Medicine

## 2012-07-10 DIAGNOSIS — I10 Essential (primary) hypertension: Secondary | ICD-10-CM | POA: Insufficient documentation

## 2012-07-10 DIAGNOSIS — J45909 Unspecified asthma, uncomplicated: Secondary | ICD-10-CM | POA: Insufficient documentation

## 2012-07-10 DIAGNOSIS — E079 Disorder of thyroid, unspecified: Secondary | ICD-10-CM | POA: Insufficient documentation

## 2012-07-10 DIAGNOSIS — J4530 Mild persistent asthma, uncomplicated: Secondary | ICD-10-CM

## 2012-07-10 DIAGNOSIS — H9190 Unspecified hearing loss, unspecified ear: Secondary | ICD-10-CM | POA: Insufficient documentation

## 2012-07-10 DIAGNOSIS — Z79899 Other long term (current) drug therapy: Secondary | ICD-10-CM | POA: Insufficient documentation

## 2012-07-10 LAB — POCT I-STAT, CHEM 8
Glucose, Bld: 117 mg/dL — ABNORMAL HIGH (ref 70–99)
HCT: 42 % (ref 36.0–46.0)
Hemoglobin: 14.3 g/dL (ref 12.0–15.0)
Potassium: 3.5 mEq/L (ref 3.5–5.1)
Sodium: 141 mEq/L (ref 135–145)

## 2012-07-10 LAB — CBC WITH DIFFERENTIAL/PLATELET
Basophils Absolute: 0 10*3/uL (ref 0.0–0.1)
Basophils Relative: 0 % (ref 0–1)
MCHC: 32.1 g/dL (ref 30.0–36.0)
Monocytes Absolute: 0.6 10*3/uL (ref 0.1–1.0)
Neutro Abs: 5.5 10*3/uL (ref 1.7–7.7)
Neutrophils Relative %: 64 % (ref 43–77)
RDW: 13.6 % (ref 11.5–15.5)

## 2012-07-10 MED ORDER — ALBUTEROL SULFATE (5 MG/ML) 0.5% IN NEBU
5.0000 mg | INHALATION_SOLUTION | Freq: Once | RESPIRATORY_TRACT | Status: AC
  Administered 2012-07-10: 5 mg via RESPIRATORY_TRACT
  Filled 2012-07-10: qty 1

## 2012-07-10 MED ORDER — MECLIZINE HCL 50 MG PO TABS: 25.0000 mg | ORAL_TABLET | Freq: Three times a day (TID) | ORAL | Status: AC | PRN

## 2012-07-10 MED ORDER — IPRATROPIUM BROMIDE 0.02 % IN SOLN
0.5000 mg | Freq: Once | RESPIRATORY_TRACT | Status: AC
  Administered 2012-07-10: 0.5 mg via RESPIRATORY_TRACT
  Filled 2012-07-10: qty 2.5

## 2012-07-10 NOTE — ED Notes (Signed)
Pt is deaf, sign language interprater paged

## 2012-07-10 NOTE — ED Notes (Addendum)
Vital signs stable.  Pt ambulated on room air with no desat

## 2012-07-10 NOTE — ED Notes (Signed)
Pt's reports relief of SOB with use of inhaler pta

## 2012-07-10 NOTE — ED Notes (Signed)
To ED from work, pt developed CP and SOB while walking, EMS gave 324 ASA and 1 nitro PTA, NSR on 12 lead, CBG 96, NAD

## 2012-07-10 NOTE — ED Provider Notes (Signed)
History     CSN: [id number]  Arrival date & time 07/10/12  1136   First MD Initiated Contact with Patient 07/10/12 1152      Chief Complaint  Patient presents with  . Chest Pain    (Consider location/radiation/quality/duration/timing/severity/associated sxs/prior treatment) HPI  51 year old female who is deaf, with history of pre-diabetes, hypertension, and asthma presents complaining of chest pain and short of breath.  History obtained through interpreter.  Pt recently diagnosed with asthma, worsening with obesity.  Pt works at post [employer].  Sts she was working today when she felt shortness of breath and throat feels dry.  She felt dizzy with sensation of room spinning, and at that time she did not have her inhaler. Pt also experiencing chest pressure and pleuritic chest pain. Her coworker call EMS to bring her to ER.  Pt received ASA, and 1 sublingual nitro.  She also received albuterol inhaler treatment.  Pt sts she feels much better now since arrival to ER.  Pt denies fever, chills, runny nose, ear pain, sore throat, neck pain. She denies nausea, vomiting, diarrhea, abdominal pain, urinary symptoms. She is a nonsmoker, with no significant family history of premature cardiac death. She has no prior history of MI.    Past Medical History  Diagnosis Date  . Pre-diabetes   . Hypertension   . Deaf   . Asthma   . Thyroid disease     Past Surgical History  Procedure Date  . Cesarean section   . Cardiac catheterization     Family History  Problem Relation Age of Onset  . Diabetes Mother   . Diabetes Father   . Heart disease Mother     History  Substance Use Topics  . Smoking status: Never Smoker   . Smokeless tobacco: Never Used  . Alcohol Use: No    OB History    Grav Para Term Preterm Abortions TAB SAB Ect Mult Living                  Review of Systems  All other systems reviewed and are negative.    Allergies  Review of patient's allergies indicates no  known allergies.  Home Medications   Current Outpatient Rx  Name Route Sig Dispense Refill  . ALBUTEROL SULFATE HFA 108 (90 BASE) MCG/ACT IN AERS Inhalation Inhale 2 puffs into the lungs every 6 (six) hours as needed for wheezing. 1 Inhaler 0  . BUDESONIDE-FORMOTEROL FUMARATE 80-4.5 MCG/ACT IN AERO Inhalation Inhale 2 puffs into the lungs 2 (two) times daily. 1 Inhaler 12  . CYCLOBENZAPRINE HCL 10 MG PO TABS Oral Take 1 tablet (10 mg total) by mouth 3 (three) times daily as needed for muscle spasms (may cause drowsiness). 45 tablet 1  . NORETHINDRONE ACETATE 5 MG PO TABS Oral Take 5 mg by mouth daily.       BP 148/80  Pulse 105  Resp 18  SpO2 100%  Physical Exam  Nursing note and vitals reviewed. Constitutional: She appears well-developed and well-nourished. No distress.       Awake, alert, nontoxic appearance  HENT:  Head: Atraumatic.  Right Ear: External ear normal.  Left Ear: External ear normal.  Mouth/Throat: Oropharynx is clear and moist.  Eyes: Conjunctivae are normal. Right eye exhibits no discharge. Left eye exhibits no discharge.  Neck: Normal range of motion. Neck supple. No JVD present. No tracheal deviation present. No thyromegaly present.  Cardiovascular: Normal rate and regular rhythm.   Pulmonary/Chest: Effort normal. No  stridor. No respiratory distress. She has wheezes. She has no rales. She exhibits no tenderness.       Scattered wheezes but otherwise lung is cleared  Abdominal: Soft. There is no tenderness. There is no rebound.  Musculoskeletal: She exhibits no edema and no tenderness.       ROM appears intact, no obvious focal weakness  Lymphadenopathy:    She has no cervical adenopathy.  Neurological: She is alert.       Mental status and motor strength appears intact  Skin: No rash noted.  Psychiatric: She has a normal mood and affect.    ED Course  Procedures (including critical care time)   Date: 07/10/2012  Rate:86  Rhythm: normal sinus rhythm   QRS Axis: normal  Intervals: normal  ST/T Wave abnormalities: normal  Conduction Disutrbances: none  Narrative Interpretation:   Old EKG Reviewed: no old ecg     Results for orders placed during the hospital encounter of 07/10/12  CBC WITH DIFFERENTIAL      Component Value Range   WBC 8.7  4.0 - 10.5 K/uL   RBC 4.97  3.87 - 5.11 MIL/uL   Hemoglobin 12.6  12.0 - 15.0 g/dL   HCT 16.1  09.6 - 04.5 %   MCV 79.1  78.0 - 100.0 fL   MCH 25.4 (*) 26.0 - 34.0 pg   MCHC 32.1  30.0 - 36.0 g/dL   RDW 40.9  81.1 - 91.4 %   Platelets 238  150 - 400 K/uL   Neutrophils Relative 64  43 - 77 %   Neutro Abs 5.5  1.7 - 7.7 K/uL   Lymphocytes Relative 29  12 - 46 %   Lymphs Abs 2.5  0.7 - 4.0 K/uL   Monocytes Relative 6  3 - 12 %   Monocytes Absolute 0.6  0.1 - 1.0 K/uL   Eosinophils Relative 1  0 - 5 %   Eosinophils Absolute 0.1  0.0 - 0.7 K/uL   Basophils Relative 0  0 - 1 %   Basophils Absolute 0.0  0.0 - 0.1 K/uL  TROPONIN I      Component Value Range   Troponin I <0.30  <0.30 ng/mL  POCT I-STAT, CHEM 8      Component Value Range   Sodium 141  135 - 145 mEq/L   Potassium 3.5  3.5 - 5.1 mEq/L   Chloride 103  96 - 112 mEq/L   BUN 17  6 - 23 mg/dL   Creatinine, Ser 7.82  0.50 - 1.10 mg/dL   Glucose, Bld 956 (*) 70 - 99 mg/dL   Calcium, Ion 2.13  0.86 - 1.23 mmol/L   TCO2 25  0 - 100 mmol/L   Hemoglobin 14.3  12.0 - 15.0 g/dL   HCT 57.8  46.9 - 62.9 %   Dg Chest 2 View  07/10/2012  *RADIOLOGY REPORT*  Clinical Data: Chest pain and shortness of breath.  CHEST - 2 VIEW  Comparison: Chest CT dated 02/09/2012  Findings: Slight cardiomegaly.  Lungs are clear.  No effusions.  No osseous abnormality.  IMPRESSION: No acute abnormality.  Slight chronic cardiomegaly.   Original Report Authenticated By: Gwynn Burly, M.D.    1. asthma   MDM  Pleuritic cp and sob, hx of asthma.  She's currently asymptomatic, and appears comfortable.  Does have mild scattered wheezes on exam.   Albuterol/atrovent nebs given.  Work up initiated.   1:35 PM Discussed with my attending, who has  seen and evaluate pt.  Pt likely having reactive airway disease.  Once pt finished with breathing treatment, will check O2 on ambulation, if stable, will d/c with PCP f/u.    2:02 PM Patient was able to ambulate without any distress. saturation remained at 100% on room air      Fayrene Helper, PA-C 07/10/12 1403

## 2012-07-11 ENCOUNTER — Encounter (HOSPITAL_COMMUNITY): Payer: No Typology Code available for payment source

## 2012-07-11 NOTE — ED Provider Notes (Signed)
Medical screening examination/treatment/procedure(s) were conducted as a shared visit with non-physician practitioner(s) and myself.  I personally evaluated the patient during the encounter Pt with hx rad/wheezing presents w episode wheezing/chest tightness. Resolved w neb tx. Chest cta. Rrr.   Suzi Roots, MD 07/11/12 [phone]

## 2012-07-16 ENCOUNTER — Encounter (HOSPITAL_COMMUNITY): Payer: No Typology Code available for payment source

## 2012-07-16 DIAGNOSIS — R0609 Other forms of dyspnea: Secondary | ICD-10-CM | POA: Insufficient documentation

## 2012-07-16 DIAGNOSIS — Z5189 Encounter for other specified aftercare: Secondary | ICD-10-CM | POA: Insufficient documentation

## 2012-07-16 DIAGNOSIS — R0989 Other specified symptoms and signs involving the circulatory and respiratory systems: Secondary | ICD-10-CM | POA: Insufficient documentation

## 2012-07-18 ENCOUNTER — Encounter (HOSPITAL_COMMUNITY)
Admission: RE | Admit: 2012-07-18 | Discharge: 2012-07-18 | Disposition: A | Discharge status: Home / Self Care | Payer: No Typology Code available for payment source | Source: Ambulatory Visit | Attending: Internal Medicine | Admitting: Internal Medicine

## 2012-07-18 ENCOUNTER — Telehealth: Payer: Self-pay | Admitting: Internal Medicine

## 2012-07-18 NOTE — Telephone Encounter (Signed)
noted 

## 2012-07-18 NOTE — Progress Notes (Signed)
First day of exercise, interpreter here for exercise session.  Oriented to equipment use, REP and Dyspnea Scale and rest breaks.  Safety and hand hygiene.  She did tire on walking track , vital signs stable.  Tolerated exercise well

## 2012-07-23 ENCOUNTER — Encounter (HOSPITAL_COMMUNITY): Payer: No Typology Code available for payment source

## 2012-07-25 ENCOUNTER — Encounter (HOSPITAL_COMMUNITY): Payer: No Typology Code available for payment source

## 2012-07-30 ENCOUNTER — Encounter (HOSPITAL_COMMUNITY)
Admission: RE | Admit: 2012-07-30 | Discharge: 2012-07-30 | Disposition: A | Discharge status: Home / Self Care | Payer: No Typology Code available for payment source | Source: Ambulatory Visit | Attending: Internal Medicine | Admitting: Internal Medicine

## 2012-08-01 ENCOUNTER — Encounter (HOSPITAL_COMMUNITY)
Admission: RE | Admit: 2012-08-01 | Discharge: 2012-08-01 | Disposition: A | Discharge status: Home / Self Care | Payer: No Typology Code available for payment source | Source: Ambulatory Visit | Attending: Internal Medicine | Admitting: Internal Medicine

## 2012-08-05 ENCOUNTER — Encounter: Payer: Self-pay | Admitting: Internal Medicine

## 2012-08-05 ENCOUNTER — Ambulatory Visit (INDEPENDENT_AMBULATORY_CARE_PROVIDER_SITE_OTHER): Payer: No Typology Code available for payment source | Admitting: Internal Medicine

## 2012-08-05 VITALS — BP 132/88 | HR 68 | Temp 98.0°F | Ht 64.0 in | Wt 243.6 lb

## 2012-08-05 DIAGNOSIS — J4530 Mild persistent asthma, uncomplicated: Secondary | ICD-10-CM

## 2012-08-05 DIAGNOSIS — E669 Obesity, unspecified: Secondary | ICD-10-CM

## 2012-08-05 DIAGNOSIS — Z23 Encounter for immunization: Secondary | ICD-10-CM

## 2012-08-05 DIAGNOSIS — J45909 Unspecified asthma, uncomplicated: Secondary | ICD-10-CM

## 2012-08-05 NOTE — Patient Instructions (Addendum)
#  WEight Management   we discussed extensively about weight management   - follow low glycemic diet plan that I outlined for you after extensive discussion. Do not follow other plans  - For breakfast  - most important meal of the day   - recommend steel cut oat meal or fiber one 60 cal  Or Kashi go-lean (one cup max ) with non-fat plain milk. Can add Berries or Egg with it - For snacks  - If peanuts causing you problems, stick to almonds or walnuts total less than 50gm per day  - have 2 servings of fruits (only those in left lane) each day  - not more than 1 low fat-light yougurt per day - For Lunch and dinner  - unlimited non-starchy vegetable (prefer raw fresh or roasted or grilled) with skinless chicken or fish - General  - avoid all breads and pasta and fried foods   - drink lot of water  - avoid all moderate and high glycemic foods  - measure weight once  A week - Restaurant  - all restaurants have bad and good choices  - Worst restaurants where choices are worst: [place], [place], [place]  - [employer] where choices are better: [place], [place]   - Ok restaurants: [employer], [employer], [employer] - have good salad choices with grilled chicken but avoid full fat salad  #Asthma  - have flu shot  - stable  #Followup   2 months

## 2012-08-05 NOTE — Progress Notes (Signed)
Subjective:    Patient ID: Rhonda Morrow, female    DOB: 1961-01-14, 51 y.o.   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 Here with interpreter.  reports that she has never smoked. She does not have any smokeless tobacco history on file. Body mass index is 42.64 kg/(m^2). Husband is patient of Dr Shelle Iron and has O2 dependency though is never smoker. She works in [employer]   Reports dyspnea. Especially with exertion walking or hurrying and walking and climbing steps. Stops half way through steps. Dyspnea also made worse by sitting in small cars. No dyspnea for eating or change of clothes. Relieved by rest and drinking water or sitting in large SUV/Van. Feels air hungry. INsidious onset. Slowly progressive. Says people can hear her dyspneic. Associated symptoms: 20# weight gain x 1 year,  occasional cough only during URI, wheezing, popping sounds occasionally in ear, hx of occasional URI and chest tightness. Says chest tightness improved by prevacid. Uses 3 pillows to sleep for years. Unclear if there is  paroxysmal nocturnal dyspnea or not. 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 Denies family hx of asthma. No pet birds. Has dog outside. No cats. No mold exposure but maybe in bathtub (not sure)  Walking desaturation test 185 feet x 3 laps"   REC Do not know why you are short of breath  Please have breathing test called PFT  BAsed on those test results I will call you and decide next step   OV 03/11/2012 Here to review test results. Interpreter is Rebecca [place]; patient deaf and mute  PFt date 01/24/12 shows mixed obstruction - restriction but greater restriction with low dlco   - fev1 1/48L/58%, Ratio 82, TLC 56,  DLCO 14/51%, No BD response  CT chest 02/09/12:  The ct chest does not show any scarring.     In terms of symptoms: current wet, cold, rainy weather bothering her  but only mildly - more sneezing. IN terms of dyspnea: still present especially with stairs, walking fast, being in a rush. Improved with rest and drinking water. Slowly getting worse. DEnies current cough. No other new problems. Also, feeling of fullness and dyspnea after eating. There is also some chest tightnes and hurt when yawnining; improves with water. Lot of questions on why she is dyspneic incluing if obesity is contributing.   Past, Family, Social reviewed: no change since last visit   REC  Please have methacholine challenge test for asthma  We will call you with results  If test positive, you will come in to start asthma treatment  IF test negative, will refer you to pulmonary rehab for weight related shortness of breath   OV 04/18/2012  Here to discuss results of methacholine challenge.No interim complaints. INterepreter is Lurena Joiner  positive methachole challenge test ofr 03/19/12  - good cooperation per RT. 24% Fev1 drop and 17% FVC drop at dose 0.25   #Shortness of breath  - this is due to asthma and weight  - for asthma Please start symbicort 80/4.5 2 puff twice daily - take sample, and show technique (script with refills done)  - I have referred you to pulmonary rehabilitation for shortness of breath exercises  #Weight management - I will discuss diet with you next visit  #Followup 6-8 weeks or sooner if needed    OV 06/24/2012 - followup dyspnea on  basis of asthma and obesity. Here with deaf mute intepreter.   - Reports improved dyspnea following starting symbicort > 90% compliance . Using albuterol for rescue only which she has used only occasionally; only once or two times per last week.  Feels she can sleep and rest better now but I am unable to sort out what is really better. Walking steps still a problem and in fact NOT improved. She is still though resorting to albuterol for rescue and drinking water. No associated weight loss. She has not attended rehab; says  [facility]etta So has not called back on phone calls.    - Obesity Body mass index is 42.43 kg/(m^2).: diet rviewed. Eating way too many high and medium glycemic foods. Eats very little low glycemic foods   #ASthma  - Continue symbicort 80/4.5 2 puff twice daily  #shortness of breath and obesity  - I have emailed Phyllis at rehab and will get back to you about starting there in rehabreferred you to pulmonary rehabilitation for shortness of breath exercises  #obesity  - follow [facility] low glycemic diet for weight loss  #Followup 4 weeks or sooner if needed   OV 08/05/2012 Followup asthma and obesity  Asthma: very well controlled on symbicort. Attending Rehab  OBesity: - weight 243# and Body mass index is 41.81 kg/(m^2). - down from BMI 42.43 last ov (down 10# since May 2013 and 4# since last OV). Weight loss is intentional. She is confused between my dietary advice and advice given at rehab. Therefore, she is eating lot of high glycemic foods like ripe bananas.     Review of Systems  Constitutional: Negative for fever and unexpected weight change.  HENT: Negative for ear pain, nosebleeds, congestion, sore throat, rhinorrhea, sneezing, trouble swallowing, dental problem, postnasal drip and sinus pressure.   Eyes: Negative for redness and itching.  Respiratory: Negative for cough, chest tightness, shortness of breath and wheezing.   Cardiovascular: Negative for palpitations and leg swelling.  Gastrointestinal: Negative for nausea and vomiting.  Genitourinary: Negative for dysuria.  Musculoskeletal: Negative for joint swelling.  Skin: Negative for rash.  Neurological: Negative for headaches.  Hematological: Does not bruise/bleed easily.  Psychiatric/Behavioral: Negative for dysphoric mood. The patient is not nervous/anxious.        Objective:   Physical Exam Vitals reviewed. Constitutional: She is oriented to person, place, and time. She appears well-developed and well-nourished. No distress.        Obese Body mass index is 42.64 kg/(m^2). On 06/24/12 Body mass index is 41.81 kg/(m^2). on 08/05/2012  Lungs: CTA bialterally   HENT: Psychiatric: She has a normal mood and affect. Her behavior is normal. Judgment and thought content normal.   Speaks via interpreetr        Assessment & Plan:

## 2012-08-06 ENCOUNTER — Encounter (HOSPITAL_COMMUNITY)
Admission: RE | Admit: 2012-08-06 | Discharge: 2012-08-06 | Disposition: A | Discharge status: Home / Self Care | Payer: No Typology Code available for payment source | Source: Ambulatory Visit | Attending: Internal Medicine | Admitting: Internal Medicine

## 2012-08-06 DIAGNOSIS — Z5189 Encounter for other specified aftercare: Secondary | ICD-10-CM | POA: Insufficient documentation

## 2012-08-06 DIAGNOSIS — R0989 Other specified symptoms and signs involving the circulatory and respiratory systems: Secondary | ICD-10-CM | POA: Insufficient documentation

## 2012-08-06 DIAGNOSIS — R0609 Other forms of dyspnea: Secondary | ICD-10-CM | POA: Insufficient documentation

## 2012-08-06 NOTE — Progress Notes (Signed)
Rhonda Morrow 51 y.o. female             Pulmonary Rehab Nutrition Screen Nutrition Screen completed with pt via interpretor. Pt received another copy of the Rate Your Plate nutrition screen to complete at home and return on Thursday 10/3.  Please answer the following questions:             YES  NO    Do you live in a nursing home?  X   Do you eat out more than 3 times per week?   X  If yes, how many times per week do you eat out? 5 times a week currently due to stove is broken  Do you have food allergies?  X  If yes, what are you allergic to? 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               X If yes, how much weight have you  lost or gained?  lbs over  weeks/mo   Do you want to lose weight?    X  If yes, what is a goal weight or amount of weight you would like to lose? 190 lbs  Do you eat alone most of the time?   X   Do you eat less than 2 meals/day?  X If yes, how many meals do you eat? 1-2 meals/d plus snacks  Do you use canned and convenience food?  X   Do you use a salt shaker?  X Mrs. Dash used  Do you drink more than 3 alcoholic drinks/day?  X If yes, how many drinks per day?  Are you having trouble with constipation? *  X If yes, what are you doing to help relieve constipation?  Do you have financial difficulties with buying food? *  X   Do you usually need help with grocery shopping or with cooking? *  X   Do you have a poor appetite? *                                       X   Do you have trouble chewing/ swallowing? *   X   Do you take vitamin and mineral or herbal supplements? *  X If yes, what kind of supplements do you currently take?    Ht: 62.5" Ht Readings from Last 1 Encounters:  08/05/12 5\' 4"  (1.626 m)    Wt:   243.5 lb (110.7 kg) Wt Readings from Last 3 Encounters:  08/05/12 243 lb 9.6 oz (110.496 kg)  06/24/12 247 lb 3.2 oz (112.129 kg)  06/13/12 247 lb 12.8 oz (112.401 kg)    BMI: 43.9   53.0%body fat                       Rate Your Plate Score: pt to retun 10/4  Diagnosis: Asthma Past Medical History  Diagnosis Date  . Pre-diabetes   . Hypertension   . Deaf   . Asthma   . Thyroid disease      Meds reviewed.    Wt goal: 220-232 lb ( 99.8-105.2 kg) Current tobacco use? No Food/Drug Interaction? No Labs:  Lipid Panel  No results found for this basename: chol, trig, hdl, cholhdl, vldl, ldlcalc   No results found for this basename: HGBA1C  Estimated Daily Nutrition Needs for: ? wt loss  1400-1900 Kcal, 90-110 gm protein, sodium less than 1500 mg  Nutrition Risk Level:  Low

## 2012-08-06 NOTE — Assessment & Plan Note (Signed)
There is som weight loss. She is confused between my low-glycemic advice and traditional advice given to her at rehab. I think progress made is due to her shifting to lower glycemic foods.   PLAN #WEight Management    - we discussed extensively about weight management   - follow low glycemic diet plan that I outlined for you after extensive discussion. Do not follow other plans  - General  - drink lot of water  - avoid all moderate and high glycemic foods especially bad fruits, breads, pastas, fried foods, battered foods, sugary foods  - make non-starchy vegetables your base in terms of volume you eat  - always make sure you eat a good protein and healthy fat source  - good protein source is egg white, beans, tofu, fish, chicken breast, fish, [place] and bison  - good healthy fat source is nuts, and fish   - focusing on eating right healthy foods (left lane) and avoiding unhealthy foods (middle and right lane) is better way to lose weight than to go hypo-caloric  - focus on staying full by eating right  - having a daily and weekly plan for what you will eat and where you will eat depending on your work, social life schedule is very important   - watch out for misleading labels on grocery aisle: High Fiber and Low Fat labeled foods generally are high in bad carbs or sugar  - measure weight once  a week  - discipline is key   - For breakfast  - most important meal of the day. So, eat daily breakfast. Do not skip   - recommend steel cut oat meal or fiber one 60 cal  Or Kashi go-lean (one cup max ) with non-fat plain milk. Can add Berries or Egg with it  - For snacks  - recommend total 2-3 snacks per day  - snack should be light and filling  - best times are between breakfast and lunch, lunch and dinner and sometimes post-dinner snack  - If peanuts causing you problems, stick to almonds or walnuts total less than 50gm per day  - have 2 servings of fruits (only those in left lane) each  day  - not more than 1 low fat-light yougurt per day  - For Lunch and dinner  - unlimited non-starchy vegetable (prefer raw fresh or roasted or grilled) with skinless chicken or fish  - Restaurant  - all restaurants have bad and good choices. So you can make good choices when you to most restaurants. Even fast food restaurants could offer you good choices  - Worst restaurants where choices are worst: [place], [place], [place]  - [employer] where choices are better: [place], [place]   - Ok restaurants: McDonald's, [employer], [employer]   - Always ask for grilled meat or vegetables, and fresh salad choices  > 50% of this > 25 min visit spent in face to face counseling (15 min visit converted to 25 min)

## 2012-08-06 NOTE — Assessment & Plan Note (Signed)
Stable  Continue inhalers

## 2012-08-08 ENCOUNTER — Encounter (HOSPITAL_COMMUNITY)
Admission: RE | Admit: 2012-08-08 | Discharge: 2012-08-08 | Disposition: A | Discharge status: Home / Self Care | Payer: No Typology Code available for payment source | Source: Ambulatory Visit | Attending: Internal Medicine | Admitting: Internal Medicine

## 2012-08-13 ENCOUNTER — Encounter (HOSPITAL_COMMUNITY): Payer: No Typology Code available for payment source

## 2012-08-15 ENCOUNTER — Encounter (HOSPITAL_COMMUNITY)
Admission: RE | Admit: 2012-08-15 | Discharge: 2012-08-15 | Disposition: A | Discharge status: Home / Self Care | Payer: No Typology Code available for payment source | Source: Ambulatory Visit | Attending: Internal Medicine | Admitting: Internal Medicine

## 2012-08-20 ENCOUNTER — Encounter (HOSPITAL_COMMUNITY): Payer: No Typology Code available for payment source

## 2012-08-20 ENCOUNTER — Telehealth: Payer: Self-pay | Admitting: Internal Medicine

## 2012-08-20 NOTE — Telephone Encounter (Signed)
Yes, MR has these forms and stated he will work on them and have them to me by Thursday.. I had spoken with Elease Hashimoto in [employer] about this and she was supposed to notify the pt. [id number] to notify the pt. Carron Curie, CMA

## 2012-08-20 NOTE — Telephone Encounter (Signed)
Advised pt MR has the FMLA forms & will have them to [place] by Thursday.  Pt was very appreciative & asks to be reached when complete.  Antionette Fairy

## 2012-08-20 NOTE — Telephone Encounter (Signed)
Jenn, have you seen this?  Thanks.

## 2012-08-22 ENCOUNTER — Ambulatory Visit (INDEPENDENT_AMBULATORY_CARE_PROVIDER_SITE_OTHER): Payer: No Typology Code available for payment source | Admitting: Family Medicine

## 2012-08-22 ENCOUNTER — Encounter (HOSPITAL_COMMUNITY)
Admission: RE | Admit: 2012-08-22 | Discharge: 2012-08-22 | Disposition: A | Discharge status: Home / Self Care | Payer: No Typology Code available for payment source | Source: Ambulatory Visit | Attending: Internal Medicine | Admitting: Internal Medicine

## 2012-08-22 ENCOUNTER — Encounter: Payer: Self-pay | Admitting: Family Medicine

## 2012-08-22 VITALS — BP 126/81 | HR 89 | Temp 97.8°F | Ht 61.0 in | Wt 245.0 lb

## 2012-08-22 DIAGNOSIS — Z23 Encounter for immunization: Secondary | ICD-10-CM

## 2012-08-22 DIAGNOSIS — K644 Residual hemorrhoidal skin tags: Secondary | ICD-10-CM | POA: Insufficient documentation

## 2012-08-22 DIAGNOSIS — L853 Xerosis cutis: Secondary | ICD-10-CM | POA: Insufficient documentation

## 2012-08-22 DIAGNOSIS — L738 Other specified follicular disorders: Secondary | ICD-10-CM

## 2012-08-22 MED ORDER — DIBUCAINE 1 % EX OINT: TOPICAL_OINTMENT | Freq: Three times a day (TID) | CUTANEOUS | Status: DC | PRN

## 2012-08-22 MED ORDER — HYDROCORTISONE ACE-PRAMOXINE 2.5-1 % RE CREA: TOPICAL_CREAM | Freq: Three times a day (TID) | RECTAL | Status: DC

## 2012-08-22 NOTE — Patient Instructions (Addendum)
Apply Eucerin or Aveeno twice daily for your dry skin It may be a new bra that's causing the itching Use the Nupercainal to numb the hemorrhoid pain Use the Analpram to heal the hemorrhoid Call with any questions or concerns Hang in there!

## 2012-08-22 NOTE — Assessment & Plan Note (Signed)
New.  Encouraged regular use of lotion twice daily, short/warm showers, evaluating bra for possible irritants.  Pt expressed understanding and is in agreement w/ plan.

## 2012-08-22 NOTE — Progress Notes (Signed)
Rhonda Morrow came to exercise today, interpreters were present. Education session the first 40 minutes of class.  Her check in BP after class was 164/ 90.  Rested and rechecked, continued to be 160/90. She was at MD office this am and it was within normal limits.  Discussed with patient and advised due to the short amount of time she would have to exercise that we would delay exercise until Tuesday.  Advised to get BP checks at home over the week end.  We will continue to monitor.

## 2012-08-22 NOTE — Assessment & Plan Note (Signed)
New.  Painful and itchy but not thrombosed.  Start nupercainal, analpram.  Reviewed diet and lifestyle modifications that will improve sxs.  Reviewed supportive care and red flags that should prompt return.  Pt expressed understanding and is in agreement w/ plan.

## 2012-08-22 NOTE — Progress Notes (Signed)
  Subjective:    Patient ID: Rhonda Morrow, female    DOB: January 24, 1961, 51 y.o.   MRN: [id number]  HPI Breasts itching- sxs started a few months ago.  Started as a red spot but this resolved.  Using lotion w/ some relief but will intermittently still have itching.  Not worse w/ bathing.  No change in soaps or detergents.  Rectal spasm- sxs started Monday after BM.  No blood.  Stool was 'dark [place], almost black'.  Stools are soft, no constipation.  No new meds.  Has recently increased amount of fruits and veggies.  + rectal pain w/ sitting.  Some itching and burning.  Pt is deaf, hx obtained by sign language interpreter   Review of Systems For ROS see HPI     Objective:   Physical Exam  Vitals reviewed. Constitutional: She appears well-developed and well-nourished. No distress.  Genitourinary: Rectal exam shows external hemorrhoid (no evidence of thrombosis) and tenderness (over moderately sized external hemorrhoid). Rectal exam shows no internal hemorrhoid, no fissure, no mass and anal tone normal. Guaiac negative stool.  Skin: Skin is warm and dry. No rash noted. No erythema.       Dry skin noted on breasts bilaterally          Assessment & Plan:

## 2012-08-23 NOTE — Telephone Encounter (Signed)
Yes forms were completed and sent to [employer] yesterday morning. Pt is aware. Carron Curie, CMA  Carron Curie, CMA

## 2012-08-23 NOTE — Telephone Encounter (Signed)
Candise Bowens, do you know if MR had a chance to do this yesterday?

## 2012-08-27 ENCOUNTER — Encounter (HOSPITAL_COMMUNITY)
Admission: RE | Admit: 2012-08-27 | Discharge: 2012-08-27 | Disposition: A | Discharge status: Home / Self Care | Payer: No Typology Code available for payment source | Source: Ambulatory Visit | Attending: Internal Medicine | Admitting: Internal Medicine

## 2012-08-27 NOTE — Progress Notes (Signed)
Rhonda Morrow came in today for Pul Rehab.  After first exercise station she became light headed and dizzy, BP 144/78.  She had not eaten since early breakfast, snack given and she felt better. We will continue to monitor , encourage and support.

## 2012-08-29 ENCOUNTER — Encounter (HOSPITAL_COMMUNITY)
Admission: RE | Admit: 2012-08-29 | Discharge: 2012-08-29 | Disposition: A | Discharge status: Home / Self Care | Payer: No Typology Code available for payment source | Source: Ambulatory Visit | Attending: Internal Medicine | Admitting: Internal Medicine

## 2012-09-02 ENCOUNTER — Ambulatory Visit (INDEPENDENT_AMBULATORY_CARE_PROVIDER_SITE_OTHER): Payer: No Typology Code available for payment source | Admitting: Family Medicine

## 2012-09-02 ENCOUNTER — Encounter: Payer: Self-pay | Admitting: Family Medicine

## 2012-09-02 VITALS — BP 140/95 | HR 80 | Temp 97.8°F | Ht 62.5 in | Wt 243.7 lb

## 2012-09-02 DIAGNOSIS — I10 Essential (primary) hypertension: Secondary | ICD-10-CM

## 2012-09-02 MED ORDER — HYDROCHLOROTHIAZIDE 12.5 MG PO TABS: 12.5000 mg | ORAL_TABLET | Freq: Every day | ORAL | Status: DC

## 2012-09-02 NOTE — Progress Notes (Signed)
  Subjective:    Patient ID: Rhonda Morrow, female    DOB: 1961-02-22, 51 y.o.   MRN: [id number]  HPI HTN- going to Pulm rehab every Tuesday and Thursday and BP has been elevated.  Nurse would not let pt exercise at last visit.  BP was 160/90 on 10/17 and 144/78 on 10/22.  Will have intermittent dizziness.  No CP.  Chronic SOB which is what prompted rehab.  No visual changes.  + family hx- mother.  No personal hx of elevated BP.  Hx provided by sign language interpreter.   Review of Systems For ROS see HPI     Objective:   Physical Exam  Vitals reviewed. Constitutional: She is oriented to person, place, and time. She appears well-developed and well-nourished. No distress.  HENT:  Head: Normocephalic and atraumatic.  Eyes: Conjunctivae normal and EOM are normal. Pupils are equal, round, and reactive to light.  Neck: Normal range of motion. Neck supple. No thyromegaly present.  Cardiovascular: Normal rate, regular rhythm, normal heart sounds and intact distal pulses.   No murmur heard. Pulmonary/Chest: Effort normal and breath sounds normal. No respiratory distress.  Abdominal: Soft. She exhibits no distension. There is no tenderness.  Musculoskeletal: She exhibits no edema.  Lymphadenopathy:    She has no cervical adenopathy.  Neurological: She is alert and oriented to person, place, and time.  Skin: Skin is warm and dry.  Psychiatric: She has a normal mood and affect. Her behavior is normal.          Assessment & Plan:

## 2012-09-02 NOTE — Assessment & Plan Note (Signed)
New.  Reviewed recent BP readings from pulm rehab and BP is again elevated in office.  Pt also w/ family hx and current obesity.  Will start low dose HCTZ and repeat BMP in 4 weeks.  Reviewed dx, importance of meds, possible side effects, and lifestyle modifications that will improve BP via interpreter.  Will follow closely.

## 2012-09-02 NOTE — Patient Instructions (Addendum)
Follow up in 1 month to recheck BP and potassium Start the HCTZ daily (in the morning) for better BP control Continue to exercise- this is good for you! Limit the amount of salt in your diet Call with any questions or concerns Happy early birthday!

## 2012-09-03 ENCOUNTER — Encounter (HOSPITAL_COMMUNITY): Payer: No Typology Code available for payment source

## 2012-09-05 ENCOUNTER — Encounter (HOSPITAL_COMMUNITY)
Admission: RE | Admit: 2012-09-05 | Discharge: 2012-09-05 | Disposition: A | Discharge status: Home / Self Care | Payer: No Typology Code available for payment source | Source: Ambulatory Visit | Attending: Internal Medicine | Admitting: Internal Medicine

## 2012-09-05 ENCOUNTER — Other Ambulatory Visit: Payer: Self-pay | Admitting: Internal Medicine

## 2012-09-05 MED ORDER — BUDESONIDE-FORMOTEROL FUMARATE 80-4.5 MCG/ACT IN AERO: 2.0000 | INHALATION_SPRAY | Freq: Two times a day (BID) | RESPIRATORY_TRACT | Status: DC

## 2012-09-05 NOTE — Progress Notes (Signed)
Rhonda Morrow 51 y.o. female Nutrition Note Spoke with pt. Pt is obese. Per pt, her MD has put her on a low glycemic index diet. Pt is eating a burger and cheese from [employer] and throwing the bun away. Healthier options discussed. Pt feels the diet is too restrictive. According to pt's Rate Your Plate results, there are some ways the pt can make her eating habits healthier.  Pt's Rate Your Plate results reviewed with pt.  Pt unable to avoid salty food due to frequency of eating out. Pt eats out 5 times a week because her stove broke and her refrigerator is not working now. Pt does not add salt to food.  The role of sodium in lung disease reviewed with pt.  Pt is pre-diabetic. Pre-diabetes discussed. Pt expressed understanding.   Nutrition Diagnosis   Excessive sodium intake related to over consumption of processed food as evidenced by eating out frequently.   Food-and nutrition-related knowledge deficit related to lack of exposure to information as related to diagnosis of pulmonary disease   Obesity related to excessive energy intake as evidenced by a BMI of 43.9 Nutrition Rx/Est. Daily Nutrition Needs for: ? wt loss 1400-1900 Kcal  90-110 gm protein   1500 mg or less sodium      Nutrition Intervention   Pt's individual nutrition plan and goals reviewed with pt.   Benefits of adopting healthy eating habits discussed when pt's Rate Your Plate reviewed.   Pt to attend the Nutrition and Lung Disease class   Handouts given for: Healthy Choices for [employer], 1500 kcal, 5-day menu ideas, and Pre-diabetes.   Continual client-centered nutrition education by RD, as part of interdisciplinary care. Goal(s) 1. Pt to identify and limit food sources of sodium. 2. Identify food quantities necessary to achieve wt loss of  -2# per week to a goal wt of 100-105.2 kg (220-232 lb) at graduation from pulmonary rehab. 3. Pt to be able to identify foods that affect blood glucose levels. Monitor and Evaluate  progress toward nutrition goal with team.   Nutrition Risk: Low

## 2012-09-05 NOTE — Telephone Encounter (Signed)
Pt ins requires 90 day supply

## 2012-09-10 ENCOUNTER — Encounter (HOSPITAL_COMMUNITY)
Admission: RE | Admit: 2012-09-10 | Discharge: 2012-09-10 | Disposition: A | Discharge status: Home / Self Care | Payer: No Typology Code available for payment source | Source: Ambulatory Visit | Attending: Internal Medicine | Admitting: Internal Medicine

## 2012-09-10 DIAGNOSIS — R0609 Other forms of dyspnea: Secondary | ICD-10-CM | POA: Insufficient documentation

## 2012-09-10 DIAGNOSIS — R0989 Other specified symptoms and signs involving the circulatory and respiratory systems: Secondary | ICD-10-CM | POA: Insufficient documentation

## 2012-09-10 DIAGNOSIS — Z5189 Encounter for other specified aftercare: Secondary | ICD-10-CM | POA: Insufficient documentation

## 2012-09-12 ENCOUNTER — Encounter (HOSPITAL_COMMUNITY): Payer: No Typology Code available for payment source

## 2012-09-17 ENCOUNTER — Encounter (HOSPITAL_COMMUNITY): Payer: No Typology Code available for payment source

## 2012-09-19 ENCOUNTER — Encounter (HOSPITAL_COMMUNITY)
Admission: RE | Admit: 2012-09-19 | Discharge: 2012-09-19 | Disposition: A | Discharge status: Home / Self Care | Payer: No Typology Code available for payment source | Source: Ambulatory Visit | Attending: Internal Medicine | Admitting: Internal Medicine

## 2012-09-24 ENCOUNTER — Encounter (HOSPITAL_COMMUNITY): Payer: No Typology Code available for payment source

## 2012-09-26 ENCOUNTER — Encounter (HOSPITAL_COMMUNITY)
Admission: RE | Admit: 2012-09-26 | Discharge: 2012-09-26 | Disposition: A | Discharge status: Home / Self Care | Payer: No Typology Code available for payment source | Source: Ambulatory Visit | Attending: Internal Medicine | Admitting: Internal Medicine

## 2012-10-01 ENCOUNTER — Encounter (HOSPITAL_COMMUNITY): Payer: No Typology Code available for payment source

## 2012-10-02 ENCOUNTER — Encounter: Payer: Self-pay | Admitting: Internal Medicine

## 2012-10-02 ENCOUNTER — Ambulatory Visit (INDEPENDENT_AMBULATORY_CARE_PROVIDER_SITE_OTHER): Payer: No Typology Code available for payment source | Admitting: Internal Medicine

## 2012-10-02 VITALS — BP 122/78 | HR 84 | Temp 98.0°F | Ht 64.0 in | Wt 248.7 lb

## 2012-10-02 DIAGNOSIS — Z23 Encounter for immunization: Secondary | ICD-10-CM

## 2012-10-02 DIAGNOSIS — J4530 Mild persistent asthma, uncomplicated: Secondary | ICD-10-CM

## 2012-10-02 DIAGNOSIS — J45909 Unspecified asthma, uncomplicated: Secondary | ICD-10-CM

## 2012-10-02 DIAGNOSIS — E669 Obesity, unspecified: Secondary | ICD-10-CM

## 2012-10-02 NOTE — Assessment & Plan Note (Signed)
Discussed again. She has trouble complying with advice. Again she says she is confused between my advice adn advice of RD at rehab. I have explicitly told her to follow one advice; preferrably mine. Went over low glycemic diet sheet again. Explained no magic bullet for discipline and will power. She dropped her jaw when I explained no pizza, fried food and breads due to glyucemic content of these foods  > 50% of this 15 min visit spent in face to face counseling

## 2012-10-02 NOTE — Progress Notes (Signed)
Subjective:    Patient ID: Rhonda Morrow, female    DOB: Jan 24, 1961, 51 y.o.   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 Here with interpreter.  reports that she has never smoked. She does not have any smokeless tobacco history on file. Body mass index is 42.64 kg/(m^2). Husband is patient of Dr Shelle Iron and has O2 dependency though is never smoker. She works in [employer]   Reports dyspnea. Especially with exertion walking or hurrying and walking and climbing steps. Stops half way through steps. Dyspnea also made worse by sitting in small cars. No dyspnea for eating or change of clothes. Relieved by rest and drinking water or sitting in large SUV/Van. Feels air hungry. INsidious onset. Slowly progressive. Says people can hear her dyspneic. Associated symptoms: 20# weight gain x 1 year,  occasional cough only during URI, wheezing, popping sounds occasionally in ear, hx of occasional URI and chest tightness. Says chest tightness improved by prevacid. Uses 3 pillows to sleep for years. Unclear if there is  paroxysmal nocturnal dyspnea or not. 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 Denies family hx of asthma. No pet birds. Has dog outside. No cats. No mold exposure but maybe in bathtub (not sure)  Walking desaturation test 185 feet x 3 laps"   REC Do not know why you are short of breath  Please have breathing test called PFT  BAsed on those test results I will call you and decide next step   OV 03/11/2012 Here to review test results. Interpreter is Rebecca [place]; patient deaf and mute  PFt date 01/24/12 shows mixed obstruction - restriction but greater restriction with low dlco   - fev1 1/48L/58%, Ratio 82, TLC 56,  DLCO 14/51%, No BD response  CT chest 02/09/12:  The ct chest does not show any scarring.     In terms of symptoms: current wet, cold, rainy weather bothering her  but only mildly - more sneezing. IN terms of dyspnea: still present especially with stairs, walking fast, being in a rush. Improved with rest and drinking water. Slowly getting worse. DEnies current cough. No other new problems. Also, feeling of fullness and dyspnea after eating. There is also some chest tightnes and hurt when yawnining; improves with water. Lot of questions on why she is dyspneic incluing if obesity is contributing.   Past, Family, Social reviewed: no change since last visit   REC  Please have methacholine challenge test for asthma  We will call you with results  If test positive, you will come in to start asthma treatment  IF test negative, will refer you to pulmonary rehab for weight related shortness of breath   OV 04/18/2012  Here to discuss results of methacholine challenge.No interim complaints. INterepreter is Lurena Joiner  positive methachole challenge test ofr 03/19/12  - good cooperation per RT. 24% Fev1 drop and 17% FVC drop at dose 0.25   #Shortness of breath  - this is due to asthma and weight  - for asthma Please start symbicort 80/4.5 2 puff twice daily - take sample, and show technique (script with refills done)  - I have referred you to pulmonary rehabilitation for shortness of breath exercises  #Weight management - I will discuss diet with you next visit  #Followup 6-8 weeks or sooner if needed    OV 06/24/2012 - followup dyspnea on  basis of asthma and obesity. Here with deaf mute intepreter.   - Reports improved dyspnea following starting symbicort > 90% compliance . Using albuterol for rescue only which she has used only occasionally; only once or two times per last week.  Feels she can sleep and rest better now but I am unable to sort out what is really better. Walking steps still a problem and in fact NOT improved. She is still though resorting to albuterol for rescue and drinking water. No associated weight loss. She has not attended rehab; says  [facility]etta So has not called back on phone calls.    - Obesity Body mass index is 42.43 kg/(m^2).: diet rviewed. Eating way too many high and medium glycemic foods. Eats very little low glycemic foods   #ASthma  - Continue symbicort 80/4.5 2 puff twice daily  #shortness of breath and obesity  - I have emailed Phyllis at rehab and will get back to you about starting there in rehabreferred you to pulmonary rehabilitation for shortness of breath exercises  #obesity  - follow [facility] low glycemic diet for weight loss  #Followup 4 weeks or sooner if needed   OV 08/05/2012 Followup asthma and obesity  Asthma: very well controlled on symbicort. Attending Rehab  OBesity: - weight 243# and Body mass index is 41.81 kg/(m^2). - down from BMI 42.43 last ov (down 10# since May 2013 and 4# since last OV). Weight loss is intentional. She is confused between my dietary advice and advice given at rehab. Therefore, she is eating lot of high glycemic foods like ripe bananas.        OV 10/02/2012  Estimated Body mass index is 42.69 kg/(m^2) as calculated from the following:   Height as of this encounter: 5\' 4" (1.626 m).   Weight as of this encounter: 248 lb 11.2 oz(112.81 kg).   ASthma - well controlled. No problems  Multifactrial dyspnea - resolved with rehab. Going to [employer]  Weight  - gained weight after last visit. Stil says she is confused about dietary advice between what I have instructed and what RD at rehab has instructed her. She is poorly compliant with the low glycemic foods I have outlined Review of Systems  Constitutional: Negative for fever and unexpected weight change.  HENT: Negative for ear pain, nosebleeds, congestion, sore throat, rhinorrhea, sneezing, trouble swallowing, dental problem, postnasal drip and sinus pressure.   Eyes: Negative for redness and itching.  Respiratory: Negative for cough, chest tightness, shortness of breath and wheezing.   Cardiovascular: Negative for  palpitations and leg swelling.  Gastrointestinal: Negative for nausea and vomiting.  Genitourinary: Negative for dysuria.  Musculoskeletal: Negative for joint swelling.  Skin: Negative for rash.  Neurological: Negative for headaches.  Hematological: Does not bruise/bleed easily.  Psychiatric/Behavioral: Negative for dysphoric mood. The patient is not nervous/anxious.        Objective:   Physical Exam Vitals reviewed. Constitutional: She is oriented to person, place, and time. She appears well-developed and well-nourished. No distress.       Obese Body mass index is 42.64 kg/(m^2). On 06/24/12 Body mass index is 41.81 kg/(m^2). on 08/05/2012 Body mass index is 42.69 kg/(m^2). on 10/02/2012   HEENT: no thrush Lungs: CTA bialterally   HENT: Psychiatric: She has a normal mood and affect. Her behavior is normal. Judgment and thought content normal.   Speaks via interpreetr       Assessment & Plan:

## 2012-10-02 NOTE — Patient Instructions (Addendum)
#  ASthma  - well controlled - continue current medication regimen  #WEight   - follow the diet advice I have outlined; eat only foods in the left lane  #Followup  3 months

## 2012-10-02 NOTE — Assessment & Plan Note (Signed)
Very well controlled  Plan  Continue current mdi Rx

## 2012-10-03 ENCOUNTER — Encounter (HOSPITAL_COMMUNITY): Payer: No Typology Code available for payment source

## 2012-10-08 ENCOUNTER — Encounter (HOSPITAL_COMMUNITY): Payer: No Typology Code available for payment source

## 2012-10-10 ENCOUNTER — Encounter (HOSPITAL_COMMUNITY)
Admission: RE | Admit: 2012-10-10 | Discharge: 2012-10-10 | Disposition: A | Discharge status: Home / Self Care | Payer: No Typology Code available for payment source | Source: Ambulatory Visit | Attending: Internal Medicine | Admitting: Internal Medicine

## 2012-10-10 DIAGNOSIS — Z5189 Encounter for other specified aftercare: Secondary | ICD-10-CM | POA: Insufficient documentation

## 2012-10-10 DIAGNOSIS — R0989 Other specified symptoms and signs involving the circulatory and respiratory systems: Secondary | ICD-10-CM | POA: Insufficient documentation

## 2012-10-10 DIAGNOSIS — R0609 Other forms of dyspnea: Secondary | ICD-10-CM | POA: Insufficient documentation

## 2012-10-15 ENCOUNTER — Encounter (HOSPITAL_COMMUNITY)
Admission: RE | Admit: 2012-10-15 | Discharge: 2012-10-15 | Disposition: A | Discharge status: Home / Self Care | Payer: No Typology Code available for payment source | Source: Ambulatory Visit | Attending: Internal Medicine | Admitting: Internal Medicine

## 2012-10-17 ENCOUNTER — Encounter (HOSPITAL_COMMUNITY)
Admission: RE | Admit: 2012-10-17 | Discharge: 2012-10-17 | Disposition: A | Discharge status: Home / Self Care | Payer: No Typology Code available for payment source | Source: Ambulatory Visit | Attending: Internal Medicine | Admitting: Internal Medicine

## 2012-10-22 ENCOUNTER — Encounter (HOSPITAL_COMMUNITY): Payer: No Typology Code available for payment source

## 2012-10-24 ENCOUNTER — Encounter (HOSPITAL_COMMUNITY)
Admission: RE | Admit: 2012-10-24 | Discharge: 2012-10-24 | Disposition: A | Discharge status: Home / Self Care | Payer: No Typology Code available for payment source | Source: Ambulatory Visit | Attending: Internal Medicine | Admitting: Internal Medicine

## 2012-11-05 ENCOUNTER — Encounter (HOSPITAL_COMMUNITY)
Admission: RE | Admit: 2012-11-05 | Discharge: 2012-11-05 | Disposition: A | Discharge status: Home / Self Care | Payer: No Typology Code available for payment source | Source: Ambulatory Visit | Attending: Internal Medicine | Admitting: Internal Medicine

## 2012-11-07 ENCOUNTER — Encounter (HOSPITAL_COMMUNITY)
Admission: RE | Admit: 2012-11-07 | Discharge: 2012-11-07 | Disposition: A | Discharge status: Home / Self Care | Payer: 59 | Source: Ambulatory Visit | Attending: Internal Medicine | Admitting: Internal Medicine

## 2012-11-07 DIAGNOSIS — Z5189 Encounter for other specified aftercare: Secondary | ICD-10-CM | POA: Insufficient documentation

## 2012-11-07 DIAGNOSIS — R0609 Other forms of dyspnea: Secondary | ICD-10-CM | POA: Insufficient documentation

## 2012-11-07 DIAGNOSIS — R0989 Other specified symptoms and signs involving the circulatory and respiratory systems: Secondary | ICD-10-CM | POA: Insufficient documentation

## 2012-11-12 ENCOUNTER — Encounter (HOSPITAL_COMMUNITY): Payer: 59

## 2012-11-14 ENCOUNTER — Encounter (HOSPITAL_COMMUNITY)
Admission: RE | Admit: 2012-11-14 | Discharge: 2012-11-14 | Disposition: A | Discharge status: Home / Self Care | Payer: 59 | Source: Ambulatory Visit | Attending: Internal Medicine | Admitting: Internal Medicine

## 2012-11-18 ENCOUNTER — Other Ambulatory Visit: Payer: Self-pay | Admitting: Family Medicine

## 2012-11-18 MED ORDER — HYDROCORTISONE ACE-PRAMOXINE 2.5-1 % RE CREA: TOPICAL_CREAM | Freq: Three times a day (TID) | RECTAL | Status: DC

## 2012-11-18 NOTE — Telephone Encounter (Signed)
refill [facility]  28.4GM wt/1-refill last fill 11.25.13 Apply Topically 3 times daily as needed for pain  Also needs hydrocort pramoxine 2.5-1% crm #30gm wt/1-refill place rectally 3 times daily last fill 11.25.13

## 2012-11-18 NOTE — Telephone Encounter (Signed)
Ok for refill on each 

## 2012-11-18 NOTE — Telephone Encounter (Signed)
Please advise on RF request.  Last OV:09-02-12.//AB/CMA 

## 2012-11-18 NOTE — Telephone Encounter (Signed)
Rx sent to the pharmacy by e-script.//AB/CMA 

## 2012-11-19 ENCOUNTER — Encounter (HOSPITAL_COMMUNITY)
Admission: RE | Admit: 2012-11-19 | Discharge: 2012-11-19 | Disposition: A | Discharge status: Home / Self Care | Payer: 59 | Source: Ambulatory Visit | Attending: Internal Medicine | Admitting: Internal Medicine

## 2012-11-21 ENCOUNTER — Encounter (HOSPITAL_COMMUNITY)
Admission: RE | Admit: 2012-11-21 | Discharge: 2012-11-21 | Disposition: A | Discharge status: Home / Self Care | Payer: 59 | Source: Ambulatory Visit | Attending: Internal Medicine | Admitting: Internal Medicine

## 2012-11-26 ENCOUNTER — Encounter (HOSPITAL_COMMUNITY)
Admission: RE | Admit: 2012-11-26 | Discharge: 2012-11-26 | Disposition: A | Discharge status: Home / Self Care | Payer: 59 | Source: Ambulatory Visit | Attending: Internal Medicine | Admitting: Internal Medicine

## 2012-11-27 ENCOUNTER — Other Ambulatory Visit: Payer: Self-pay | Admitting: Otolaryngology

## 2012-11-27 DIAGNOSIS — D449 Neoplasm of uncertain behavior of unspecified endocrine gland: Secondary | ICD-10-CM

## 2012-11-28 ENCOUNTER — Encounter (HOSPITAL_COMMUNITY)
Admission: RE | Admit: 2012-11-28 | Discharge: 2012-11-28 | Disposition: A | Discharge status: Home / Self Care | Payer: 59 | Source: Ambulatory Visit | Attending: Internal Medicine | Admitting: Internal Medicine

## 2012-12-03 ENCOUNTER — Encounter (HOSPITAL_COMMUNITY)
Admission: RE | Admit: 2012-12-03 | Discharge: 2012-12-03 | Disposition: A | Discharge status: Home / Self Care | Payer: 59 | Source: Ambulatory Visit | Attending: Internal Medicine | Admitting: Internal Medicine

## 2012-12-04 ENCOUNTER — Ambulatory Visit
Admission: RE | Admit: 2012-12-04 | Discharge: 2012-12-04 | Disposition: A | Discharge status: Home / Self Care | Payer: No Typology Code available for payment source | Source: Ambulatory Visit | Attending: Otolaryngology | Admitting: Otolaryngology

## 2012-12-04 DIAGNOSIS — D449 Neoplasm of uncertain behavior of unspecified endocrine gland: Secondary | ICD-10-CM

## 2012-12-05 ENCOUNTER — Encounter (HOSPITAL_COMMUNITY)
Admission: RE | Admit: 2012-12-05 | Discharge: 2012-12-05 | Disposition: A | Discharge status: Home / Self Care | Payer: 59 | Source: Ambulatory Visit | Attending: Internal Medicine | Admitting: Internal Medicine

## 2012-12-10 ENCOUNTER — Encounter (HOSPITAL_COMMUNITY): Payer: No Typology Code available for payment source

## 2012-12-30 ENCOUNTER — Emergency Department (HOSPITAL_COMMUNITY): Payer: No Typology Code available for payment source

## 2012-12-30 ENCOUNTER — Encounter (HOSPITAL_COMMUNITY): Payer: Self-pay | Admitting: Emergency Medicine

## 2012-12-30 ENCOUNTER — Emergency Department (HOSPITAL_COMMUNITY)
Admission: EM | Admit: 2012-12-30 | Discharge: 2012-12-30 | Disposition: A | Discharge status: Home / Self Care | Payer: No Typology Code available for payment source | Attending: Emergency Medicine | Admitting: Emergency Medicine

## 2012-12-30 DIAGNOSIS — S59909A Unspecified injury of unspecified elbow, initial encounter: Secondary | ICD-10-CM | POA: Insufficient documentation

## 2012-12-30 DIAGNOSIS — M25559 Pain in unspecified hip: Secondary | ICD-10-CM | POA: Insufficient documentation

## 2012-12-30 DIAGNOSIS — Y93E9 Activity, other interior property and clothing maintenance: Secondary | ICD-10-CM | POA: Insufficient documentation

## 2012-12-30 DIAGNOSIS — S7001XA Contusion of right hip, initial encounter: Secondary | ICD-10-CM

## 2012-12-30 DIAGNOSIS — Z79899 Other long term (current) drug therapy: Secondary | ICD-10-CM | POA: Insufficient documentation

## 2012-12-30 DIAGNOSIS — J45909 Unspecified asthma, uncomplicated: Secondary | ICD-10-CM | POA: Insufficient documentation

## 2012-12-30 DIAGNOSIS — S59919A Unspecified injury of unspecified forearm, initial encounter: Secondary | ICD-10-CM | POA: Insufficient documentation

## 2012-12-30 DIAGNOSIS — Z8679 Personal history of other diseases of the circulatory system: Secondary | ICD-10-CM | POA: Insufficient documentation

## 2012-12-30 DIAGNOSIS — H913 Deaf nonspeaking, not elsewhere classified: Secondary | ICD-10-CM | POA: Insufficient documentation

## 2012-12-30 DIAGNOSIS — Y92009 Unspecified place in unspecified non-institutional (private) residence as the place of occurrence of the external cause: Secondary | ICD-10-CM | POA: Insufficient documentation

## 2012-12-30 DIAGNOSIS — S6990XA Unspecified injury of unspecified wrist, hand and finger(s), initial encounter: Secondary | ICD-10-CM | POA: Insufficient documentation

## 2012-12-30 DIAGNOSIS — Z862 Personal history of diseases of the blood and blood-forming organs and certain disorders involving the immune mechanism: Secondary | ICD-10-CM | POA: Insufficient documentation

## 2012-12-30 DIAGNOSIS — W010XXA Fall on same level from slipping, tripping and stumbling without subsequent striking against object, initial encounter: Secondary | ICD-10-CM | POA: Insufficient documentation

## 2012-12-30 DIAGNOSIS — S7010XA Contusion of unspecified thigh, initial encounter: Secondary | ICD-10-CM | POA: Insufficient documentation

## 2012-12-30 DIAGNOSIS — Z8639 Personal history of other endocrine, nutritional and metabolic disease: Secondary | ICD-10-CM | POA: Insufficient documentation

## 2012-12-30 DIAGNOSIS — S5011XA Contusion of right forearm, initial encounter: Secondary | ICD-10-CM

## 2012-12-30 DIAGNOSIS — S5010XA Contusion of unspecified forearm, initial encounter: Secondary | ICD-10-CM | POA: Insufficient documentation

## 2012-12-30 DIAGNOSIS — R7309 Other abnormal glucose: Secondary | ICD-10-CM | POA: Insufficient documentation

## 2012-12-30 LAB — CBC WITH DIFFERENTIAL/PLATELET
Basophils Absolute: 0 10*3/uL (ref 0.0–0.1)
HCT: 42.6 % (ref 36.0–46.0)
Lymphocytes Relative: 17 % (ref 12–46)
Monocytes Absolute: 0.5 10*3/uL (ref 0.1–1.0)
Neutro Abs: 6.3 10*3/uL (ref 1.7–7.7)
RBC: 5.25 MIL/uL — ABNORMAL HIGH (ref 3.87–5.11)
RDW: 15.1 % (ref 11.5–15.5)
WBC: 8.2 10*3/uL (ref 4.0–10.5)

## 2012-12-30 LAB — BASIC METABOLIC PANEL
Chloride: 103 mEq/L (ref 96–112)
Creatinine, Ser: 0.81 mg/dL (ref 0.50–1.10)
GFR calc Af Amer: >90 mL/min (ref >90)
Potassium: 3.4 mEq/L — ABNORMAL LOW (ref 3.5–5.1)
Sodium: 139 mEq/L (ref 135–145)

## 2012-12-30 LAB — PROTIME-INR
INR: 1 (ref 0.00–1.49)
Prothrombin Time: 13.1 seconds (ref 11.6–15.2)

## 2012-12-30 MED ORDER — HYDROMORPHONE HCL PF 1 MG/ML IJ SOLN
1.0000 mg | Freq: Once | INTRAMUSCULAR | Status: AC
  Administered 2012-12-30: 1 mg via INTRAVENOUS
  Filled 2012-12-30: qty 1

## 2012-12-30 MED ORDER — SODIUM CHLORIDE 0.9 % IV BOLUS (SEPSIS)
1000.0000 mL | Freq: Once | INTRAVENOUS | Status: AC
  Administered 2012-12-30: 1000 mL via INTRAVENOUS

## 2012-12-30 MED ORDER — ONDANSETRON HCL 4 MG/2ML IJ SOLN
4.0000 mg | Freq: Once | INTRAMUSCULAR | Status: AC
Start: 2012-12-30 — End: 2012-12-30
  Administered 2012-12-30: 4 mg via INTRAVENOUS
  Filled 2012-12-30: qty 2

## 2012-12-30 MED ORDER — HYDROCODONE-ACETAMINOPHEN 5-325 MG PO TABS: 2.0000 | ORAL_TABLET | Freq: Four times a day (QID) | ORAL | Status: DC | PRN

## 2012-12-30 NOTE — ED Notes (Signed)
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  Patient is c/o right hamstring pain.  Patient is also deaf.

## 2012-12-30 NOTE — ED Notes (Signed)
Patient had Dr. Fonnie Jarvis call her personal interpreter who said she'd be here within the half hour.

## 2012-12-30 NOTE — ED Notes (Signed)
Patient transported to X-ray 

## 2012-12-30 NOTE — ED Notes (Signed)
Bed:WA05<BR> Expected date:<BR> Expected time:<BR> Means of arrival:<BR> Comments:<BR> ems

## 2012-12-30 NOTE — ED Provider Notes (Signed)
History     CSN: [id number]  Arrival date & time 12/30/12  1112   First MD Initiated Contact with Patient 12/30/12 1128      Chief Complaint  Patient presents with  . Leg Pain    right leg    (Consider location/radiation/quality/duration/timing/severity/associated sxs/prior treatment) HPI 42 is a deaf-mute and morbidly obese was cleaning her house tripped over a toy and fell onto her right hip causing severe right hip and thigh pain with inability to walk due to the pain, there is no weakness or numbness to the foot or the leg, she is no other injury, her arms and left leg are fine, she is no head injury no neck injury no back pain no chest pain no shortness of breath no abdominal pain no headache no amnesia. She does have nausea. She has a history of vertigo in the past but no vertigo today. She has no new lateralizing weakness or numbness. Her pain is severe and there is no treatment prior to arrival. Past Medical History  Diagnosis Date  . Pre-diabetes   . Hypertension   . Deaf   . Asthma   . 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 Cesarean section    . Cardiac catheterization      Family History  Problem Relation Age of Onset  . Diabetes Mother   . Diabetes Father   . Heart disease Mother     History  Substance Use Topics  . Smoking status: Never Smoker   . Smokeless tobacco: Never Used  . Alcohol Use: No    OB History   Grav Para Term Preterm Abortions TAB SAB Ect Mult Living                  Review of Systems 10 Systems reviewed and are negative for acute change except as noted in the HPI. Allergies  Review of patient's allergies indicates no known allergies.  Home Medications   Current Outpatient Rx  Name  Route  Sig  Dispense  Refill  . budesonide-formoterol (SYMBICORT) 80-4.5 MCG/ACT inhaler   Inhalation   Inhale 2 puffs into the lungs 2 (two) times daily. For wheezing   3  Inhaler   1   . HYDROcodone-acetaminophen (NORCO) 5-325 MG per tablet   Oral   Take 2 tablets by mouth every 6 (six) hours as needed for pain.   20 tablet   0     BP 122/62  Pulse 79  Temp(Src) 97.8 F (36.6 C) (Oral)  Resp 16  SpO2 92%  Physical Exam  Nursing note and vitals reviewed. Constitutional:  Awake, alert, nontoxic appearance with baseline speech for patient.  HENT:  Head: Atraumatic.  Mouth/Throat: No oropharyngeal exudate.  Eyes: EOM are normal. Pupils are equal, round, and reactive to light. Right eye exhibits no discharge. Left eye exhibits no discharge.  No nystagmus  Neck: Neck supple.  Cervical spine and back are nontender  Cardiovascular: Normal rate and regular rhythm.   No murmur heard. Pulmonary/Chest: Effort normal and breath sounds normal. No stridor. No respiratory distress. She has no wheezes. She has no rales. She exhibits no tenderness.  Abdominal: Soft. Bowel sounds are normal. She exhibits no mass. There is no tenderness. There is no rebound.  Musculoskeletal: She exhibits tenderness. She exhibits no edema.  Baseline ROM, moves extremities with no obvious new focal weakness.  Lymphadenopathy:    She has  no cervical adenopathy.  Neurological: She is alert.  Awake, alert, cooperative and aware of situation; motor strength 5/5 bilaterally with somewhat limited exam of right leg due to right hip and thigh pain; sensation normal to light touch bilaterally; peripheral visual fields full to confrontation; no facial asymmetry; tongue midline; major cranial nerves appear intact; no pronator drift arms, normal finger to nose bilaterally  Skin: No rash noted.  Psychiatric: She has a normal mood and affect.    ED Course  Procedures (including critical care time) Patient / Family / Caregiver understand and agree with initial ED impression and plan with expectations set for ED visit (labs and xrays).  After plain films show no obvious hip fracture the  patient states she is still not able to walk due to pain in the right hip so CT scan was ordered of the pelvis, she also now complains of some very mild pain to the right wrist forearm elbow and some films are ordered of those regions she has minimal if any diffuse tenderness with good range of motion to those areas with capillary refill less than 2 seconds of the right hand.   Labs Reviewed  BASIC METABOLIC PANEL - Abnormal; Notable for the following:    Potassium 3.4 (*)    Glucose, Bld 140 (*)    GFR calc non Af Amer 84 (*)    All other components within normal limits  CBC WITH DIFFERENTIAL - Abnormal; Notable for the following:    RBC 5.25 (*)    MCH 25.9 (*)    All other components within normal limits  PROTIME-INR   No results found.   1. Contusion, hip and thigh, right, initial encounter   2. Forearm contusion, right, initial encounter       MDM  Pt stable in ED with no significant deterioration in condition.  Patient / Family / Caregiver informed of clinical course, understand medical decision-making process, and agree with plan.  I doubt any other EMC precluding discharge at this time including, but not necessarily limited to the following:hip Fx.        Hurman Horn, MD 01/06/13 [phone]

## 2013-01-07 NOTE — Progress Notes (Signed)
PULMONARY REHAB UNDERGRAD PROGRAM OUTCOME REPORT  Pt has completed the pulmonary rehab undergrad program meeting all program goals and personal goals. Patient had a goal to use stairs and not be as short of breath. Patient states she can now do that. Patients also states she is able to get around better and she has learned how to conserve energy. Patient was able to do her pursed lip breathing and her diaphrgmatic breathing. She used RPE and Dyspnea scale. Her home exercise was given on 08/06/2012. She did progress her total exercise time, frequency, and intensity as tolerated. Pt did complete all 24 sessions but forgot to bring her Post QOL packet and she did not mail in back either. Patient seems to have a better overall quality of life. Her breathing has improved. Her post walk test improved drastically 780 ft pre to 1180 ft post. Her oxygen saturations were above 96% on RA the whole test both times. Met level finished at 3.0. All exercise flow sheets have been scanned in. She was a delight to work with and her attendance was remarkable. 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 She is aware of the maintenance program and knows to call if she would like to join.

## 2013-01-10 ENCOUNTER — Ambulatory Visit: Payer: No Typology Code available for payment source | Admitting: Family Medicine

## 2013-01-15 ENCOUNTER — Ambulatory Visit (INDEPENDENT_AMBULATORY_CARE_PROVIDER_SITE_OTHER): Payer: No Typology Code available for payment source | Admitting: Family Medicine

## 2013-01-15 ENCOUNTER — Encounter: Payer: Self-pay | Admitting: Family Medicine

## 2013-01-15 VITALS — BP 130/82 | HR 73 | Temp 97.4°F | Ht 63.25 in | Wt 248.0 lb

## 2013-01-15 DIAGNOSIS — S7010XA Contusion of unspecified thigh, initial encounter: Secondary | ICD-10-CM

## 2013-01-15 DIAGNOSIS — S7011XA Contusion of right thigh, initial encounter: Secondary | ICD-10-CM

## 2013-01-15 MED ORDER — IBUPROFEN 600 MG PO TABS: 600.0000 mg | ORAL_TABLET | Freq: Three times a day (TID) | ORAL | Status: DC | PRN

## 2013-01-15 MED ORDER — HYDROCODONE-ACETAMINOPHEN 5-325 MG PO TABS: 2.0000 | ORAL_TABLET | Freq: Four times a day (QID) | ORAL | Status: DC | PRN

## 2013-01-15 NOTE — Progress Notes (Signed)
  Subjective:    Patient ID: Rhonda Morrow, female    DOB: 04/07/1961, 52 y.o.   MRN: [id number]  HPI Leg pain- fell while cleaning her house on 2/24.  Went to ER for evaluation due to severe pain.  R sided where butt and thigh meet.  Will radiate into leg.  Has a swollen lump.  No break on xrays and CT scan.  Still having pain.  Unable to sleep at night.  Out of pain meds from ER.  Difficulty w/ stairs.  Walking straight leg.  No bowel or bladder incontinence.  No fevers.   Review of Systems For ROS see HPI     Objective:   Physical Exam  Vitals reviewed. Constitutional: She is oriented to person, place, and time. She appears well-developed and well-nourished. No distress.  Musculoskeletal: She exhibits edema (hematoma present in R posterior upper thigh, + TTP).  Neurological: She is alert and oriented to person, place, and time.  Skin: Skin is warm and dry. No erythema.  No evidence of cellulitis Gravitational bruising down R leg          Assessment & Plan:

## 2013-01-15 NOTE — Patient Instructions (Addendum)
This is a hematoma- a deep, painful bruise Use a heating pad to help reabsorb/break up the hematoma Take the Ibuprofen during the day for pain Use the Hydrocodone nightly for pain Call if no improvement in the next week Hang in there!

## 2013-01-19 NOTE — Assessment & Plan Note (Addendum)
New.  Reviewed ER notes and images.  Pt w/ traumatic hematoma and gravitational bruising down back of R leg.  Refill on pain meds prn.  Reviewed importance of heat.  Note provided for work.

## 2013-01-30 ENCOUNTER — Ambulatory Visit (INDEPENDENT_AMBULATORY_CARE_PROVIDER_SITE_OTHER): Payer: No Typology Code available for payment source | Admitting: Family Medicine

## 2013-01-30 ENCOUNTER — Telehealth: Payer: Self-pay | Admitting: Family Medicine

## 2013-01-30 ENCOUNTER — Encounter: Payer: Self-pay | Admitting: Family Medicine

## 2013-01-30 VITALS — BP 140/92 | HR 66 | Temp 97.9°F | Ht 63.25 in | Wt 250.0 lb

## 2013-01-30 DIAGNOSIS — H8110 Benign paroxysmal vertigo, unspecified ear: Secondary | ICD-10-CM

## 2013-01-30 MED ORDER — HYDROCODONE-ACETAMINOPHEN 5-325 MG PO TABS: 2.0000 | ORAL_TABLET | Freq: Four times a day (QID) | ORAL | Status: DC | PRN

## 2013-01-30 MED ORDER — ALBUTEROL SULFATE HFA 108 (90 BASE) MCG/ACT IN AERS: 2.0000 | INHALATION_SPRAY | Freq: Four times a day (QID) | RESPIRATORY_TRACT | Status: DC | PRN

## 2013-01-30 MED ORDER — MECLIZINE HCL 25 MG PO TABS: 25.0000 mg | ORAL_TABLET | Freq: Three times a day (TID) | ORAL | Status: DC | PRN

## 2013-01-30 NOTE — Patient Instructions (Addendum)
This is called positional vertigo Use the Meclizine as needed for the dizziness Change positions slowly to allow your body time to adjust Drink plenty of fluids- dizziness is worse when dehydrated Claritin or Zyrtec (allergy meds) daily can help w/ the symptoms Call with any questions or concerns Hang in there!!!

## 2013-01-30 NOTE — Progress Notes (Signed)
  Subjective:    Patient ID: Rhonda Morrow, female    DOB: [birth year], 52 y.o.   MRN: [id number]  HPI Dizziness- pt reports room is spinning when she is lying down, rolling over, or w/ position change.  Almost passed out at work rising from seated position.  No nausea.  Reports 'eyes will shift'.  sxs started 3 weeks ago intermittently but is now more regular.  No fevers.  No sinus pain/pressure.  No ear pain.   Review of Systems For ROS see HPI     Objective:   Physical Exam  Vitals reviewed. Constitutional: She is oriented to person, place, and time. She appears well-developed and well-nourished. No distress.  HENT:  Head: Normocephalic and atraumatic.  Mouth/Throat: Uvula is midline and mucous membranes are normal.  TMs WNL No TTP over sinuses Minimal nasal congestion  Eyes: Conjunctivae and EOM are normal. Pupils are equal, round, and reactive to light.  2-3 beats of horizontal nystagmus  Neck: Normal range of motion. Neck supple.  Cardiovascular: Normal rate, regular rhythm, normal heart sounds and intact distal pulses.   Pulmonary/Chest: Effort normal and breath sounds normal. No respiratory distress. She has no wheezes. She has no rales.  Musculoskeletal: She exhibits no edema.  Lymphadenopathy:    She has no cervical adenopathy.  Neurological: She is alert and oriented to person, place, and time. She has normal reflexes. No cranial nerve deficit. Coordination normal.  Skin: Skin is warm and dry.  Psychiatric: She has a normal mood and affect. Her behavior is normal. Judgment and thought content normal.          Assessment & Plan:

## 2013-01-30 NOTE — Assessment & Plan Note (Signed)
New.  Pt's hx and normal PE consistent w/ BPV.  Reviewed dx and benign nature as well as chance for recurrence.  Start meclizine prn.  Reviewed importance of changing positions slowly.  Reviewed supportive care and red flags that should prompt return.  Pt expressed understanding and is in agreement w/ plan.

## 2013-01-30 NOTE — Telephone Encounter (Signed)
Return call attempted, vmail left.

## 2013-01-31 NOTE — Telephone Encounter (Signed)
No call information documented      KP//CMA

## 2013-01-31 NOTE — Telephone Encounter (Signed)
OV was 01/30/13--call resloved     KP

## 2013-02-03 ENCOUNTER — Ambulatory Visit (INDEPENDENT_AMBULATORY_CARE_PROVIDER_SITE_OTHER): Payer: No Typology Code available for payment source | Admitting: Internal Medicine

## 2013-02-03 ENCOUNTER — Encounter: Payer: Self-pay | Admitting: Internal Medicine

## 2013-02-03 VITALS — BP 140/88 | HR 76 | Temp 96.9°F | Ht 64.0 in | Wt 250.0 lb

## 2013-02-03 DIAGNOSIS — J4540 Moderate persistent asthma, uncomplicated: Secondary | ICD-10-CM

## 2013-02-03 DIAGNOSIS — J45909 Unspecified asthma, uncomplicated: Secondary | ICD-10-CM

## 2013-02-03 DIAGNOSIS — J4530 Mild persistent asthma, uncomplicated: Secondary | ICD-10-CM

## 2013-02-03 NOTE — Progress Notes (Signed)
Subjective:    Patient ID: Rhonda Morrow, female    DOB: Jan 24, 1961, 52 y.o.   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 Here with interpreter.  reports that she has never smoked. She does not have any smokeless tobacco history on file. Body mass index is 42.64 kg/(m^2). Husband is patient of Dr Shelle Iron and has O2 dependency though is never smoker. She works in [employer]   Reports dyspnea. Especially with exertion walking or hurrying and walking and climbing steps. Stops half way through steps. Dyspnea also made worse by sitting in small cars. No dyspnea for eating or change of clothes. Relieved by rest and drinking water or sitting in large SUV/Van. Feels air hungry. INsidious onset. Slowly progressive. Says people can hear her dyspneic. Associated symptoms: 20# weight gain x 1 year,  occasional cough only during URI, wheezing, popping sounds occasionally in ear, hx of occasional URI and chest tightness. Says chest tightness improved by prevacid. Uses 3 pillows to sleep for years. Unclear if there is  paroxysmal nocturnal dyspnea or not. 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 Denies family hx of asthma. No pet birds. Has dog outside. No cats. No mold exposure but maybe in bathtub (not sure)  Walking desaturation test 185 feet x 3 laps"   REC Do not know why you are short of breath  Please have breathing test called PFT  BAsed on those test results I will call you and decide next step   OV 03/11/2012 Here to review test results. Interpreter is Rebecca [place]; patient deaf and mute  PFt date 01/24/12 shows mixed obstruction - restriction but greater restriction with low dlco   - fev1 1/48L/58%, Ratio 82, TLC 56,  DLCO 14/51%, No BD response  CT chest 02/09/12:  The ct chest does not show any scarring.     In terms of symptoms: current wet, cold, rainy weather bothering her  but only mildly - more sneezing. IN terms of dyspnea: still present especially with stairs, walking fast, being in a rush. Improved with rest and drinking water. Slowly getting worse. DEnies current cough. No other new problems. Also, feeling of fullness and dyspnea after eating. There is also some chest tightnes and hurt when yawnining; improves with water. Lot of questions on why she is dyspneic incluing if obesity is contributing.   Past, Family, Social reviewed: no change since last visit   REC  Please have methacholine challenge test for asthma  We will call you with results  If test positive, you will come in to start asthma treatment  IF test negative, will refer you to pulmonary rehab for weight related shortness of breath   OV 04/18/2012  Here to discuss results of methacholine challenge.No interim complaints. INterepreter is Lurena Joiner  positive methachole challenge test ofr 03/19/12  - good cooperation per RT. 24% Fev1 drop and 17% FVC drop at dose 0.25   #Shortness of breath  - this is due to asthma and weight  - for asthma Please start symbicort 80/4.5 2 puff twice daily - take sample, and show technique (script with refills done)  - I have referred you to pulmonary rehabilitation for shortness of breath exercises  #Weight management - I will discuss diet with you next visit  #Followup 6-8 weeks or sooner if needed    OV 06/24/2012 - followup dyspnea on  basis of asthma and obesity. Here with deaf mute intepreter.   - Reports improved dyspnea following starting symbicort > 90% compliance . Using albuterol for rescue only which she has used only occasionally; only once or two times per last week.  Feels she can sleep and rest better now but I am unable to sort out what is really better. Walking steps still a problem and in fact NOT improved. She is still though resorting to albuterol for rescue and drinking water. No associated weight loss. She has not attended rehab; says  Jamesetta So has not called back on phone calls.    - Obesity Body mass index is 42.43 kg/(m^2).: diet rviewed. Eating way too many high and medium glycemic foods. Eats very little low glycemic foods   #ASthma  - Continue symbicort 80/4.5 2 puff twice daily  #shortness of breath and obesity  - I have emailed Phyllis at rehab and will get back to you about starting there in rehabreferred you to pulmonary rehabilitation for shortness of breath exercises  #obesity  - follow [facility] low glycemic diet for weight loss  #Followup 4 weeks or sooner if needed   OV 08/05/2012 Followup asthma and obesity  Asthma: very well controlled on symbicort. Attending Rehab  OBesity: - weight 243# and Body mass index is 41.81 kg/(m^2). - down from BMI 42.43 last ov (down 10# since May 2013 and 4# since last OV). Weight loss is intentional. She is confused between my dietary advice and advice given at rehab. Therefore, she is eating lot of high glycemic foods like ripe bananas.        OV 10/02/2012  Estimated Body mass index is 42.69 kg/(m^2) as calculated from the following:   Height as of this encounter: 5\' 4" (1.626 m).   Weight as of this encounter: 248 lb 11.2 oz(112.81 kg).   ASthma - well controlled. No problems  Multifactrial dyspnea - resolved with rehab. Going to [employer]  Weight  - gained weight after last visit. Stil says she is confused about dietary advice between what I have instructed and what RD at rehab has instructed her. She is poorly compliant with the low glycemic foods I have outlined  #ASthma  - well controlled  - continue current medication regimen  #WEight  - follow the diet advice I have outlined; eat only foods in the left lane  #Followup  3 months  OV 02/03/2013  Followup for asthma. She continues on Symbicort 2 puff 2 times a day without any change. She uses albuterol for rescue once every few days. There no nocturnal awakenings or chest tightness. She does continue to  have her chronic dyspnea on exertion for which she was attending pulmonary rehabilitation until she fell down due to benign positional vertigo and sustained a hematoma in her right thigharound end February 2014. She is now doing well and back to baseline. In terms of weight loss she has not followed the diet and has not lost any weight. She has looked into bariatric surgery but found it too expensive and her insurance will not cover it . There are  no other issues   Review of Systems  Constitutional: Negative for fever and unexpected weight change.  HENT: Negative for ear pain, nosebleeds, congestion, sore throat, rhinorrhea, sneezing, trouble swallowing, dental problem, postnasal drip and sinus pressure.   Eyes: Negative for redness and itching.  Respiratory: Positive for shortness of breath. Negative for cough, chest tightness and wheezing.   Cardiovascular: Negative for palpitations and  leg swelling.  Gastrointestinal: Negative for nausea and vomiting.  Genitourinary: Negative for dysuria.  Musculoskeletal: Negative for joint swelling.  Skin: Negative for rash.  Neurological: Negative for headaches.  Hematological: Does not bruise/bleed easily.  Psychiatric/Behavioral: Negative for dysphoric mood. The patient is not nervous/anxious.    Current outpatient prescriptions:albuterol (PROVENTIL HFA;VENTOLIN HFA) 108 (90 BASE) MCG/ACT inhaler, Inhale 2 puffs into the lungs every 6 (six) hours as needed for wheezing., Disp: 1 Inhaler, Rfl: 6;  budesonide-formoterol (SYMBICORT) 80-4.5 MCG/ACT inhaler, Inhale 2 puffs into the lungs 2 (two) times daily. For wheezing, Disp: 3 Inhaler, Rfl: 1 HYDROcodone-acetaminophen (NORCO) 5-325 MG per tablet, Take 2 tablets by mouth every 6 (six) hours as needed for pain., Disp: 30 tablet, Rfl: 0;  ibuprofen (ADVIL,MOTRIN) 600 MG tablet, Take 1 tablet (600 mg total) by mouth every 8 (eight) hours as needed for pain., Disp: 60 tablet, Rfl: 0;  meclizine (ANTIVERT) 25 MG  tablet, Take 1 tablet (25 mg total) by mouth 3 (three) times daily as needed., Disp: 45 tablet, Rfl: 3     Objective:   Physical Exam  Vitals reviewed. Constitutional: She is oriented to person, place, and time. She appears well-developed and well-nourished. No distress.  Body mass index is 42.89 kg/(m^2). Speaks through a deaf mute interpreter  HENT:  Head: Normocephalic and atraumatic.  Right Ear: External ear normal.  Left Ear: External ear normal.  Mouth/Throat: Oropharynx is clear and moist. No oropharyngeal exudate.  Eyes: Conjunctivae and EOM are normal. Pupils are equal, round, and reactive to light. Right eye exhibits no discharge. Left eye exhibits no discharge. No scleral icterus.  Neck: Normal range of motion. Neck supple. No JVD present. No tracheal deviation present. No thyromegaly present.  Cardiovascular: Normal rate, regular rhythm, normal heart sounds and intact distal pulses.  Exam reveals no gallop and no friction rub.   No murmur heard. Pulmonary/Chest: Effort normal and breath sounds normal. No respiratory distress. She has no wheezes. She has no rales. She exhibits no tenderness.  Abdominal: Soft. Bowel sounds are normal. She exhibits no distension and no mass. There is no tenderness. There is no rebound and no guarding.  Musculoskeletal: Normal range of motion. She exhibits no edema and no tenderness.  Lymphadenopathy:    She has no cervical adenopathy.  Neurological: She is alert and oriented to person, place, and time. She has normal reflexes. No cranial nerve deficit. She exhibits normal muscle tone. Coordination normal.  Skin: Skin is warm and dry. No rash noted. She is not diaphoretic. No erythema. No pallor.  Psychiatric: She has a normal mood and affect. Her behavior is normal. Judgment and thought content normal.   Vitals reviewed. Constitutional: She is oriented to person, place, and time. She appears well-developed and well-nourished. No distress.        Obese Body mass index is 42.64 kg/(m^2). On 06/24/12 Body mass index is 41.81 kg/(m^2). on 08/05/2012 Body mass index is 42.69 kg/(m^2). on 10/02/2012 Body mass index is 42.89 kg/(m^2). on 02/03/2013        Assessment & Plan:

## 2013-02-03 NOTE — Patient Instructions (Addendum)
#  ASthma  - well controlled - continue current medication regimen  #WEight   - follow the diet advice I have outlined; eat only foods in the left lane  #Followup 6 months Spirometry at followup with Jerolyn Shin

## 2013-02-07 NOTE — Assessment & Plan Note (Signed)
#  ASthma  - well controlled - continue current medication regimen  #WEight   - follow the diet advice I have outlined; eat only foods in the left lane  #Followup 6 months Spirometry at followup with Tammy Wilson 

## 2013-03-11 ENCOUNTER — Encounter: Payer: Self-pay | Admitting: Family Medicine

## 2013-03-11 ENCOUNTER — Other Ambulatory Visit: Payer: Self-pay | Admitting: Internal Medicine

## 2013-03-11 ENCOUNTER — Ambulatory Visit (INDEPENDENT_AMBULATORY_CARE_PROVIDER_SITE_OTHER): Payer: No Typology Code available for payment source | Admitting: Family Medicine

## 2013-03-11 VITALS — BP 122/84 | HR 103 | Temp 98.5°F | Wt 238.2 lb

## 2013-03-11 DIAGNOSIS — J302 Other seasonal allergic rhinitis: Secondary | ICD-10-CM

## 2013-03-11 DIAGNOSIS — J309 Allergic rhinitis, unspecified: Secondary | ICD-10-CM

## 2013-03-11 DIAGNOSIS — J40 Bronchitis, not specified as acute or chronic: Secondary | ICD-10-CM

## 2013-03-11 MED ORDER — ALBUTEROL SULFATE (2.5 MG/3ML) 0.083% IN NEBU
2.5000 mg | INHALATION_SOLUTION | Freq: Once | RESPIRATORY_TRACT | Status: AC
  Administered 2013-03-11: 2.5 mg via RESPIRATORY_TRACT

## 2013-03-11 MED ORDER — MONTELUKAST SODIUM 10 MG PO TABS: 10.0000 mg | ORAL_TABLET | Freq: Every day | ORAL | Status: DC

## 2013-03-11 MED ORDER — PREDNISONE 10 MG PO TABS: ORAL_TABLET | ORAL | Status: DC

## 2013-03-11 MED ORDER — AMOXICILLIN-POT CLAVULANATE 875-125 MG PO TABS: 1.0000 | ORAL_TABLET | Freq: Two times a day (BID) | ORAL | Status: DC

## 2013-03-11 MED ORDER — METHYLPREDNISOLONE ACETATE 80 MG/ML IJ SUSP
80.0000 mg | Freq: Once | INTRAMUSCULAR | Status: AC
  Administered 2013-03-11: 80 mg via INTRAMUSCULAR

## 2013-03-11 NOTE — Patient Instructions (Signed)

## 2013-03-11 NOTE — Progress Notes (Signed)
  Subjective:     Rhonda Morrow is a 52 y.o. female who presents for evaluation of symptoms of a URI. Symptoms include congestion, facial pain, low grade fever, nasal congestion, non productive cough, post nasal drip, sinus pressure and chills. Onset of symptoms was 5 days ago, and has been gradually worsening since that time. Treatment to date: antihistamines, cough suppressants and albuterol inh.  The following portions of the patient's history were reviewed and updated as appropriate: allergies, current medications, past family history, past medical history, past social history, past surgical history and problem list.  Review of Systems Pertinent items are noted in HPI.   Objective:    BP 122/84  Pulse 103  Temp(Src) 98.5 F (36.9 C) (Oral)  Wt 238 lb 3.2 oz (108.047 kg)  BMI 40.87 kg/m2  SpO2 96% General appearance: alert, cooperative, appears stated age and no distress Ears: normal TM's and external ear canals both ears Nose: green discharge, mild congestion, turbinates red, swollen Throat: lips, mucosa, and tongue normal; teeth and gums normal Neck: mild anterior cervical adenopathy, no JVD, supple, symmetrical, trachea midline and thyroid not enlarged, symmetric, no tenderness/mass/nodules Lungs: diminished breath sounds bibasilar Heart: S1, S2 normal   Assessment:    bronchitis   Plan:    Suggested symptomatic OTC remedies. Nasal saline spray for congestion. Augmentin per orders. Nasal steroids per orders. Follow up as needed. pred taper

## 2013-03-11 NOTE — Addendum Note (Signed)
Addended by: Lelon Perla on: 03/11/2013 02:28 PM   Modules accepted: Orders

## 2013-03-18 ENCOUNTER — Other Ambulatory Visit: Payer: Self-pay | Admitting: Internal Medicine

## 2013-03-18 MED ORDER — BUDESONIDE-FORMOTEROL FUMARATE 80-4.5 MCG/ACT IN AERO: INHALATION_SPRAY | RESPIRATORY_TRACT | Status: DC

## 2013-03-24 ENCOUNTER — Encounter: Payer: Self-pay | Admitting: Lab

## 2013-03-25 ENCOUNTER — Encounter: Payer: Self-pay | Admitting: Family Medicine

## 2013-03-25 ENCOUNTER — Ambulatory Visit (INDEPENDENT_AMBULATORY_CARE_PROVIDER_SITE_OTHER): Payer: No Typology Code available for payment source | Admitting: Family Medicine

## 2013-03-25 VITALS — BP 120/80 | HR 70 | Temp 97.6°F | Ht 63.25 in | Wt 239.0 lb

## 2013-03-25 DIAGNOSIS — T887XXA Unspecified adverse effect of drug or medicament, initial encounter: Secondary | ICD-10-CM

## 2013-03-25 DIAGNOSIS — T50905A Adverse effect of unspecified drugs, medicaments and biological substances, initial encounter: Secondary | ICD-10-CM

## 2013-03-25 DIAGNOSIS — R197 Diarrhea, unspecified: Secondary | ICD-10-CM | POA: Insufficient documentation

## 2013-03-25 NOTE — Assessment & Plan Note (Signed)
New.  Medication side effect- augmentin.  See above.  This was added to pt's med intolerance list

## 2013-03-25 NOTE — Patient Instructions (Addendum)
The diarrhea was due to the Augmentin STOP the Augmentin, Prednisone, and Singulair Continue your regular inhalers Drink plenty of fluids Increase your diet as you are able Use baby wipes to allow your bottom time to heal Call with any questions or concerns Hang in there!

## 2013-03-25 NOTE — Assessment & Plan Note (Signed)
New.  Pt had poor reaction to augmentin and developed sever and painful diarrhea.  This is improving since pt stopped meds.  No evidence of dehydration on PE.  Medication added to pt's list of med intolerances.  Bronchitis has resolved as lungs are CTA and pt is no longer coughing.  No need to finish abx course w/ replacement med.  Reviewed supportive care and red flags that should prompt return.  Pt expressed understanding and is in agreement w/ plan.

## 2013-03-25 NOTE — Progress Notes (Signed)
  Subjective:    Patient ID: Rhonda Morrow, female    DOB: December 07, 1960, 52 y.o.   MRN: [id number]  HPI dx'd w/ bronchitis on 5/6- started on Augmentin, prednisone, Singulair.  sxs started to improve w/ tx but developed diarrhea 1 week ago.  Could not go to work due to frequency and severity of stools.  Bottom is now raw from wiping.  Stopped abx on Friday.  Diarrhea is starting to improve.  Stools still soft but only 1 stool today.  Yesterday had 2-3 stools.   Review of Systems     Objective:   Physical Exam  Vitals reviewed. Constitutional: She is oriented to person, place, and time. She appears well-developed and well-nourished. No distress.  HENT:  Head: Normocephalic and atraumatic.  Eyes: Conjunctivae and EOM are normal. Pupils are equal, round, and reactive to light.  Neck: Normal range of motion. Neck supple. No thyromegaly present.  Cardiovascular: Normal rate, regular rhythm, normal heart sounds and intact distal pulses.   No murmur heard. Pulmonary/Chest: Effort normal and breath sounds normal. No respiratory distress. She has no wheezes. She has no rales.  Abdominal: Soft. She exhibits no distension. There is no tenderness. There is no rebound and no guarding.  Musculoskeletal: She exhibits no edema.  Lymphadenopathy:    She has no cervical adenopathy.  Neurological: She is alert and oriented to person, place, and time.  Skin: Skin is warm and dry.  Psychiatric: She has a normal mood and affect. Her behavior is normal.          Assessment & Plan:

## 2013-05-12 ENCOUNTER — Other Ambulatory Visit: Payer: Self-pay | Admitting: Family Medicine

## 2013-05-12 ENCOUNTER — Encounter: Payer: Self-pay | Admitting: Family Medicine

## 2013-05-12 ENCOUNTER — Ambulatory Visit (INDEPENDENT_AMBULATORY_CARE_PROVIDER_SITE_OTHER): Payer: No Typology Code available for payment source | Admitting: Family Medicine

## 2013-05-12 VITALS — BP 130/90 | HR 80 | Temp 98.3°F | Ht 63.25 in | Wt 237.8 lb

## 2013-05-12 DIAGNOSIS — R109 Unspecified abdominal pain: Secondary | ICD-10-CM

## 2013-05-12 DIAGNOSIS — R102 Pelvic and perineal pain: Secondary | ICD-10-CM | POA: Insufficient documentation

## 2013-05-12 DIAGNOSIS — R1030 Lower abdominal pain, unspecified: Secondary | ICD-10-CM

## 2013-05-12 LAB — POCT URINALYSIS DIPSTICK
Bilirubin, UA: NEGATIVE
Ketones, UA: NEGATIVE
Nitrite, UA: NEGATIVE
pH, UA: 6.5

## 2013-05-12 MED ORDER — CIPROFLOXACIN HCL 500 MG PO TABS: 500.0000 mg | ORAL_TABLET | Freq: Two times a day (BID) | ORAL | Status: DC

## 2013-05-12 NOTE — Progress Notes (Signed)
  Subjective:    Patient ID: Rhonda Morrow, female    DOB: [birth year], 52 y.o.   MRN: [id number]  HPI Pelvic pain- occurs intermittently, suddenly.  1st noticed last week.  Described as a sharp cramp or 'punching pain'.  Will force pt to stop and wait for this to resolve- typically about 2 minutes.  No burning w/ urination.  Increased frequency.  + hesitancy w/ incomplete bladder emptying.  Also having low back pain   Review of Systems For ROS see HPI     Objective:   Physical Exam  Vitals reviewed. Constitutional: She appears well-developed and well-nourished. No distress.  Cardiovascular: Normal rate and regular rhythm.   Pulmonary/Chest: Effort normal and breath sounds normal. No respiratory distress. She has no wheezes. She has no rales.  Abdominal: Soft. She exhibits no distension. There is tenderness (+ suprapubic but no CVA tenderness ).          Assessment & Plan:

## 2013-05-12 NOTE — Patient Instructions (Signed)
Drink plenty of fluids Start the Cipro twice daily x3 days for the bladder infection If symptoms don't improve, please call and we'll repeat the urine in 1 week Increase your potassium in the form of bananas, citrus fruits, leafy green veggies Take your inhalers to [place] but there's nothing special you need to do Call with any questions or concerns Have a great trip!

## 2013-05-12 NOTE — Assessment & Plan Note (Signed)
New.  Pt's sxs and UA consistent w/ infxn.  Unfortunately, pt only able to void minimal amount- enough to use pipette to check UA.  + leuks and blood.  Will treat as UTI.  If no improvement, will need to return for UA and cx.  Reviewed supportive care and red flags that should prompt return.  Pt expressed understanding and is in agreement w/ plan.

## 2013-05-13 NOTE — Telephone Encounter (Signed)
Ok to refill? Last OV 7.7.14 Last filled 3.27.14

## 2013-05-14 ENCOUNTER — Ambulatory Visit: Payer: No Typology Code available for payment source | Admitting: Family Medicine

## 2013-06-26 ENCOUNTER — Encounter: Payer: Self-pay | Admitting: Family Medicine

## 2013-06-26 ENCOUNTER — Ambulatory Visit (INDEPENDENT_AMBULATORY_CARE_PROVIDER_SITE_OTHER): Payer: No Typology Code available for payment source | Admitting: Family Medicine

## 2013-06-26 VITALS — BP 134/86 | HR 88 | Temp 98.4°F | Ht 63.25 in | Wt 248.0 lb

## 2013-06-26 DIAGNOSIS — R1314 Dysphagia, pharyngoesophageal phase: Secondary | ICD-10-CM | POA: Insufficient documentation

## 2013-06-26 DIAGNOSIS — K219 Gastro-esophageal reflux disease without esophagitis: Secondary | ICD-10-CM | POA: Insufficient documentation

## 2013-06-26 MED ORDER — GI COCKTAIL ~~LOC~~: 30.0000 mL | Freq: Once | ORAL | Status: DC

## 2013-06-26 MED ORDER — PANTOPRAZOLE SODIUM 40 MG PO TBEC: 40.0000 mg | DELAYED_RELEASE_TABLET | Freq: Every day | ORAL | Status: DC

## 2013-06-26 NOTE — Progress Notes (Signed)
  Subjective:    Patient ID: Rhonda Morrow, female    DOB: 11-Apr-1961, 52 y.o.   MRN: [id number]  HPI Sore throat- was at work this AM and was drinking water and had difficulty swallowing.  Felt the water was stuck.  Vomited up all the water.  Now having soreness from mouth to stomach.  No known hx of GERD.  Had OJ afterwards in small sips.  Had a few bites of muffin that went down but 'it was so sore'.  Felt weak and shaky after vomiting.   Review of Systems For ROS see HPI     Objective:   Physical Exam  Vitals reviewed. Constitutional: She appears well-developed and well-nourished. No distress.  HENT:  Head: Normocephalic and atraumatic.  Mouth/Throat: Oropharynx is clear and moist.  Neck: Normal range of motion. Neck supple.  Cardiovascular: Normal rate, regular rhythm and normal heart sounds.   Pulmonary/Chest: Effort normal and breath sounds normal. No respiratory distress. She has no wheezes. She has no rales.  Abdominal: Soft. Bowel sounds are normal. She exhibits no distension. There is no tenderness. There is no rebound and no guarding.  Lymphadenopathy:    She has no cervical adenopathy.          Assessment & Plan:

## 2013-06-26 NOTE — Patient Instructions (Addendum)
Start the protonix daily for increased acid We'll call you with your GI appt for possible endoscopy Drink plenty of fluids Call with any questions or concerns Hang in there!!

## 2013-06-30 NOTE — Assessment & Plan Note (Signed)
New.  Start PPI.  Reviewed lifestyle modifications.  Reviewed supportive care and red flags that should prompt return.  Pt expressed understanding and is in agreement w/ plan.

## 2013-06-30 NOTE — Assessment & Plan Note (Signed)
New.  The burning in pt's chest improved w/ GI cocktail.  Start PPI for possible reflux esophagitis.  Refer to GI for complete evaluation and tx.  Reviewed supportive care and red flags that should prompt return.  Pt expressed understanding and is in agreement w/ plan.

## 2013-07-01 ENCOUNTER — Telehealth: Payer: Self-pay | Admitting: Pulmonary Disease

## 2013-07-01 ENCOUNTER — Other Ambulatory Visit: Payer: Self-pay | Admitting: *Deleted

## 2013-07-01 NOTE — Telephone Encounter (Signed)
I advised through interpreter that it was not [place] but GI doctor that called and she needs to call them.Carron Curie, CMA

## 2013-07-01 NOTE — Telephone Encounter (Signed)
Error.  Pt. Has refills

## 2013-07-14 ENCOUNTER — Encounter: Payer: Self-pay | Admitting: Gastroenterology

## 2013-07-18 ENCOUNTER — Encounter (HOSPITAL_COMMUNITY): Payer: Self-pay

## 2013-07-18 ENCOUNTER — Emergency Department (HOSPITAL_COMMUNITY)

## 2013-07-18 ENCOUNTER — Emergency Department (HOSPITAL_COMMUNITY)
Admission: EM | Admit: 2013-07-18 | Discharge: 2013-07-18 | Disposition: A | Discharge status: Home / Self Care | Attending: Emergency Medicine | Admitting: Emergency Medicine

## 2013-07-18 DIAGNOSIS — S0990XA Unspecified injury of head, initial encounter: Secondary | ICD-10-CM

## 2013-07-18 DIAGNOSIS — W010XXA Fall on same level from slipping, tripping and stumbling without subsequent striking against object, initial encounter: Secondary | ICD-10-CM | POA: Insufficient documentation

## 2013-07-18 DIAGNOSIS — Y990 Civilian activity done for income or pay: Secondary | ICD-10-CM | POA: Insufficient documentation

## 2013-07-18 DIAGNOSIS — IMO0002 Reserved for concepts with insufficient information to code with codable children: Secondary | ICD-10-CM | POA: Insufficient documentation

## 2013-07-18 DIAGNOSIS — Y9229 Other specified public building as the place of occurrence of the external cause: Secondary | ICD-10-CM | POA: Insufficient documentation

## 2013-07-18 DIAGNOSIS — M542 Cervicalgia: Secondary | ICD-10-CM | POA: Insufficient documentation

## 2013-07-18 DIAGNOSIS — S0003XA Contusion of scalp, initial encounter: Secondary | ICD-10-CM | POA: Insufficient documentation

## 2013-07-18 DIAGNOSIS — I10 Essential (primary) hypertension: Secondary | ICD-10-CM | POA: Insufficient documentation

## 2013-07-18 DIAGNOSIS — J45909 Unspecified asthma, uncomplicated: Secondary | ICD-10-CM | POA: Insufficient documentation

## 2013-07-18 DIAGNOSIS — E079 Disorder of thyroid, unspecified: Secondary | ICD-10-CM | POA: Insufficient documentation

## 2013-07-18 DIAGNOSIS — Z79899 Other long term (current) drug therapy: Secondary | ICD-10-CM | POA: Insufficient documentation

## 2013-07-18 DIAGNOSIS — T148XXA Other injury of unspecified body region, initial encounter: Secondary | ICD-10-CM

## 2013-07-18 MED ORDER — MORPHINE SULFATE 4 MG/ML IJ SOLN
8.0000 mg | Freq: Once | INTRAMUSCULAR | Status: AC
  Administered 2013-07-18: 8 mg via INTRAMUSCULAR
  Filled 2013-07-18: qty 2

## 2013-07-18 MED ORDER — ONDANSETRON 4 MG PO TBDP
4.0000 mg | ORAL_TABLET | Freq: Once | ORAL | Status: AC
  Administered 2013-07-18: 4 mg via ORAL
  Filled 2013-07-18: qty 1

## 2013-07-18 MED ORDER — IBUPROFEN 800 MG PO TABS: 800.0000 mg | ORAL_TABLET | Freq: Three times a day (TID) | ORAL | Status: DC

## 2013-07-18 MED ORDER — OXYCODONE-ACETAMINOPHEN 5-325 MG PO TABS
2.0000 | ORAL_TABLET | Freq: Once | ORAL | Status: AC
  Administered 2013-07-18: 2 via ORAL
  Filled 2013-07-18: qty 2

## 2013-07-18 MED ORDER — OXYCODONE-ACETAMINOPHEN 5-325 MG PO TABS: 2.0000 | ORAL_TABLET | ORAL | Status: DC | PRN

## 2013-07-18 NOTE — ED Notes (Signed)
Bed: WU98 Expected date:  Expected time:  Means of arrival:  Comments: ems- 52 yo. F fell hit head, denies LOC

## 2013-07-18 NOTE — ED Notes (Signed)
MD at bedside. 

## 2013-07-18 NOTE — ED Notes (Signed)
Patient transported to X-ray 

## 2013-07-18 NOTE — ED Notes (Signed)
Interpreter at bedside.

## 2013-07-18 NOTE — ED Provider Notes (Signed)
CSN: [id number]     Arrival date & time 07/18/13  6213 History   First MD Initiated Contact with Patient 07/18/13 [phone]     Chief Complaint  Patient presents with  . Fall  . Leg Pain   (Consider location/radiation/quality/duration/timing/severity/associated sxs/prior Treatment) HPI Comments: Patient complains of a headache status post fall. 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 The patient also reads lips. She stated that she had a fall at work where she slipped on some oil. She fell forward hitting her head. There is no loss of consciousness but she can complains of a constant throbbing pain to her forehead. She also has some pain to her neck. There's no pain to her lower back. She states when she fell she hurt her left leg. She feels that she has a spasm in the muscle behind her left thigh. She denies any other injuries from the fall. She denies any nausea or vomiting.  Patient is a 52 y.o. female presenting with fall and leg pain.  Fall Associated symptoms include headaches. Pertinent negatives include no chest pain, no abdominal pain and no shortness of breath.  Leg Pain Associated symptoms: neck pain   Associated symptoms: no back pain, no fatigue and no fever     Past Medical History  Diagnosis Date  . Pre-diabetes   . Hypertension   . Deaf   . Asthma   . Thyroid disease    Past Surgical History  Procedure Laterality Date  . Cesarean section    . Cardiac catheterization     Family History  Problem Relation Age of Onset  . Diabetes Mother   . Diabetes Father   . Heart disease Mother    History  Substance Use Topics  . Smoking status: Never Smoker   . Smokeless tobacco: Never Used  . Alcohol Use: No   OB History   Grav Para Term Preterm Abortions TAB SAB Ect Mult Living                 Review of Systems  Constitutional: Negative for fever, chills, diaphoresis and fatigue.  HENT: Positive  for neck pain. Negative for congestion, rhinorrhea and sneezing.   Eyes: Negative.   Respiratory: Negative for cough, chest tightness and shortness of breath.   Cardiovascular: Negative for chest pain and leg swelling.  Gastrointestinal: Negative for nausea, vomiting, abdominal pain, diarrhea and blood in stool.  Genitourinary: Negative for frequency, hematuria, flank pain and difficulty urinating.  Musculoskeletal: Positive for arthralgias. Negative for back pain.  Skin: Negative for rash.  Neurological: Positive for headaches. Negative for dizziness, speech difficulty, weakness and numbness.    Allergies  Augmentin  Home Medications   Current Outpatient Rx  Name  Route  Sig  Dispense  Refill  . albuterol (PROVENTIL HFA;VENTOLIN HFA) 108 (90 BASE) MCG/ACT inhaler   Inhalation   Inhale 2 puffs into the lungs every 6 (six) hours as needed for wheezing.   1 Inhaler   6   . budesonide-formoterol (SYMBICORT) 80-4.5 MCG/ACT inhaler      INHALE 2 PUFFS INTO THE LUNGS 2 (TWO) TIMES DAILY. FOR WHEEZING   3 Inhaler   3   . Fiber CHEW   Oral   Chew 1 tablet by mouth daily.         Marland Kitchen HYDROcodone-acetaminophen (NORCO) 5-325 MG per tablet   Oral   Take 2 tablets by mouth every 6 (six)  hours as needed for pain.   30 tablet   0   . ibuprofen (ADVIL,MOTRIN) 600 MG tablet   Oral   Take 1 tablet (600 mg total) by mouth every 8 (eight) hours as needed for pain.   60 tablet   0   . meclizine (ANTIVERT) 25 MG tablet   Oral   Take 1 tablet (25 mg total) by mouth 3 (three) times daily as needed.   45 tablet   3   . Multiple Vitamin (MULTIVITAMIN) tablet   Oral   Take 1 tablet by mouth daily.         . naproxen sodium (ANAPROX) 220 MG tablet   Oral   Take 220 mg by mouth every 6 (six) hours as needed.         . pantoprazole (PROTONIX) 40 MG tablet   Oral   Take 1 tablet (40 mg total) by mouth daily.   30 tablet   3   . ibuprofen (ADVIL,MOTRIN) 800 MG tablet   Oral    Take 1 tablet (800 mg total) by mouth 3 (three) times daily.   21 tablet   0   . oxyCODONE-acetaminophen (PERCOCET) 5-325 MG per tablet   Oral   Take 2 tablets by mouth every 4 (four) hours as needed for pain.   15 tablet   0    BP 122/55  Pulse 82  Temp(Src) 97.9 F (36.6 C) (Oral)  Resp 16  SpO2 96% Physical Exam  Constitutional: She is oriented to person, place, and time. She appears well-developed and well-nourished.  HENT:  Head: Normocephalic.  Hematoma to the right forehead  Eyes: EOM are normal. Pupils are equal, round, and reactive to light.  Neck:  Moderate tenderness to the mid and lower cervical spine. No step-offs or deformities. There's no pain to the thoracic or lumbar sacral spine  Cardiovascular: Normal rate, regular rhythm and normal heart sounds.   Pulmonary/Chest: Effort normal and breath sounds normal. No respiratory distress. She has no wheezes. She has no rales. She exhibits no tenderness.  Abdominal: Soft. Bowel sounds are normal. There is no tenderness. There is no rebound and no guarding.  Musculoskeletal: Normal range of motion. She exhibits no edema.  Patient has some tenderness along the mid posterior left thigh. It appears to be along the hamstring muscle. There is no ecchymosis or bulging. There is no pain to the knee with the hip. There's no pain along the insertion points of the hamstring. She has normal pulses in the foot. There is no pain to the knee and ankle.  Lymphadenopathy:    She has no cervical adenopathy.  Neurological: She is alert and oriented to person, place, and time.  Skin: Skin is warm and dry. No rash noted.  Psychiatric: She has a normal mood and affect.    ED Course  Procedures (including critical care time) Labs Review Labs Reviewed - No data to display Imaging Review Results for orders placed in visit on 05/12/13  POCT URINALYSIS DIPSTICK      Result Value Range   Color, UA cl     Clarity, UA cl     Glucose, UA n      Bilirubin, UA n     Ketones, UA n     Spec Grav, UA <=1.005     Blood, UA trace     pH, UA 6.5     Protein, UA       Urobilinogen, UA  Nitrite, UA neg     Leukocytes, UA Trace     Dg Femur Left  07/18/2013   *RADIOLOGY REPORT*  Clinical Data: Fall.  Pain.  LEFT FEMUR - 2 VIEW  Comparison: None.  Findings: No fracture or dislocation. If there is  primary hip or knee pain, cone down views of these regions can be performed.  IMPRESSION: No fracture.   Original Report Authenticated By: Lacy Duverney, M.D.   Ct Head Wo Contrast  07/18/2013   *RADIOLOGY REPORT*  Clinical Data:  Pain post trauma  CT HEAD WITHOUT CONTRAST CT CERVICAL SPINE WITHOUT CONTRAST  Technique:  Multidetector CT imaging of the head and cervical spine was performed following the standard protocol without intravenous contrast.  Multiplanar CT image reconstructions of the cervical spine were also generated.  Comparison:  February 29, 2004  CT HEAD  Findings: The ventricles are normal in size and configuration. There is no mass, hemorrhage, extra-axial fluid collection, or midline shift.  There is chronic decreased attenuation in the posterior right corona radiata. Elsewhere, gray-white compartments are normal. There is no acute appearing infarct appreciable.  Bony calvarium appears intact.  Mastoids of the right clear.  There is diffuse opacification of mastoids on the left.  There is evidence of debris in both external auditory canals near the tympanic membranes bilaterally.  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  Elsewhere gray-white compartments are normal.  No hemorrhage or mass effect.  Extensive mastoid disease on the left without air-fluid level. Debris in both external auditory canals, possibly representing cerumen.  CT CERVICAL SPINE  Findings: There is no fracture or spondylolisthesis.  Prevertebral soft tissues and predental space regions are normal.  Disc spaces appear intact.  There is no  disc extrusion or stenosis.  There is no appreciable exit foraminal narrowing on the oblique views.  IMPRESSION: No fracture or spondylolisthesis.  No disc extrusion or stenosis.   Original Report Authenticated By: Bretta Bang, M.D.   Ct Cervical Spine Wo Contrast  07/18/2013   *RADIOLOGY REPORT*  Clinical Data:  Pain post trauma  CT HEAD WITHOUT CONTRAST CT CERVICAL SPINE WITHOUT CONTRAST  Technique:  Multidetector CT imaging of the head and cervical spine was performed following the standard protocol without intravenous contrast.  Multiplanar CT image reconstructions of the cervical spine were also generated.  Comparison:  February 29, 2004  CT HEAD  Findings: The ventricles are normal in size and configuration. There is no mass, hemorrhage, extra-axial fluid collection, or midline shift.  There is chronic decreased attenuation in the posterior right corona radiata. Elsewhere, gray-white compartments are normal. There is no acute appearing infarct appreciable.  Bony calvarium appears intact.  Mastoids of the right clear.  There is diffuse opacification of mastoids on the left.  There is evidence of debris in both external auditory canals near the tympanic membranes bilaterally.  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  Elsewhere gray-white compartments are normal.  No hemorrhage or mass effect.  Extensive mastoid disease on the left without air-fluid level. Debris in both external auditory canals, possibly representing cerumen.  CT CERVICAL SPINE  Findings: There is no fracture or spondylolisthesis.  Prevertebral soft tissues and predental space regions are normal.  Disc spaces appear intact.  There is no disc extrusion or stenosis.  There is no appreciable exit foraminal narrowing on the oblique views.  IMPRESSION: No fracture or spondylolisthesis.  No disc extrusion or stenosis.   Original Report Authenticated By:  Bretta Bang, M.D.      MDM   1. Head injury, acute,  initial encounter   2. Muscle strain    Patient's CT scan showing any signs of acute injury. Her leg pain is consistent with a muscle strain. She's at baseline mentally and has no other evidence of injury. She was discharged in good condition with a prescription for Percocet and ibuprofen. She is advised to follow up with her primary care physician if her symptoms are not improving or return here as needed for any worsening symptoms.    Rolan Bucco, MD 07/18/13 1315

## 2013-07-18 NOTE — ED Notes (Signed)
Sign Language Interpreter should be here around 0915.

## 2013-07-18 NOTE — ED Notes (Signed)
The Pt is asking about getting an x-ray of her L thigh.  This RN discussed this with the MD.  The Pt was educated that her pain seems to be muscle related and a x-ray is to look at bone, not muscle.

## 2013-07-18 NOTE — ED Notes (Addendum)
Per EMS, Pt presents after a witnessed fall.  Pt slipped and fell on oil, at work, and hit head on the concrete floor.  Denies LOC.  Denies headache, neck and back pain.  Hematoma noted over R eye.  Pt c/o cramping in L hamstring.  Vitals are stable.  A & Ox4.  Pt is deaf.  Upon assessment, Pt c/o of headache and intermittent L hamstring cramps.  Pain score 4/10.  Pt sts she has previously seen a MD regarding the cramping.

## 2013-07-18 NOTE — ED Notes (Addendum)
Patient transported to CT 

## 2013-07-18 NOTE — ED Notes (Signed)
Pt escorted to discharge window. Verbalized understanding discharge instructions. In no acute distress. Vitals reviewed and WDL.  

## 2013-07-18 NOTE — ED Notes (Signed)
Sign language interpreter has been called.  Waiting on an ETA.

## 2013-07-21 ENCOUNTER — Ambulatory Visit (INDEPENDENT_AMBULATORY_CARE_PROVIDER_SITE_OTHER): Payer: No Typology Code available for payment source | Admitting: Internal Medicine

## 2013-07-21 ENCOUNTER — Encounter: Payer: Self-pay | Admitting: Internal Medicine

## 2013-07-21 VITALS — BP 140/92 | HR 93 | Ht 62.25 in | Wt 246.0 lb

## 2013-07-21 DIAGNOSIS — J4530 Mild persistent asthma, uncomplicated: Secondary | ICD-10-CM

## 2013-07-21 DIAGNOSIS — R0600 Dyspnea, unspecified: Secondary | ICD-10-CM

## 2013-07-21 DIAGNOSIS — R0609 Other forms of dyspnea: Secondary | ICD-10-CM

## 2013-07-21 DIAGNOSIS — J45909 Unspecified asthma, uncomplicated: Secondary | ICD-10-CM

## 2013-07-21 LAB — PULMONARY FUNCTION TEST
FEF2575-%Pred-Pre: 87 %
FEV1-%Pred-Pre: 62 %
FEV1-Pre: 1.37 L
FEV1FVC-%Pred-Pre: 109 %
FEV6-%Pred-Pre: 58 %
FEV6-Pre: 1.54 L
Pre FEV6/FVC Ratio: 100 %

## 2013-07-21 NOTE — Progress Notes (Signed)
Spirometry done today. 

## 2013-07-21 NOTE — Progress Notes (Signed)
Subjective:    Patient ID: Rhonda Morrow, female    DOB: 1961-02-26, 52 y.o.   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 Here with interpreter.  reports that she has never smoked. She does not have any smokeless tobacco history on file. Body mass index is 42.64 kg/(m^2). Husband is patient of Dr Shelle Iron and has O2 dependency though is never smoker. She works in [employer]   Reports dyspnea. Especially with exertion walking or hurrying and walking and climbing steps. Stops half way through steps. Dyspnea also made worse by sitting in small cars. No dyspnea for eating or change of clothes. Relieved by rest and drinking water or sitting in large SUV/Van. Feels air hungry. INsidious onset. Slowly progressive. Says people can hear her dyspneic. Associated symptoms: 20# weight gain x 1 year,  occasional cough only during URI, wheezing, popping sounds occasionally in ear, hx of occasional URI and chest tightness. Says chest tightness improved by prevacid. Uses 3 pillows to sleep for years. Unclear if there is  paroxysmal nocturnal dyspnea or not. 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 Denies family hx of asthma. No pet birds. Has dog outside. No cats. No mold exposure but maybe in bathtub (not sure)  Walking desaturation test 185 feet x 3 laps"   REC Do not know why you are short of breath  Please have breathing test called PFT  BAsed on those test results I will call you and decide next step   OV 03/11/2012 Here to review test results. Interpreter is Rebecca [place]; patient deaf and mute  PFt date 01/24/12 shows mixed obstruction - restriction but greater restriction with low dlco   - fev1 1/48L/58%, Ratio 82, TLC 56,  DLCO 14/51%, No BD response  CT chest 02/09/12:  The ct chest does not show any scarring.     In terms of symptoms: current wet, cold, rainy weather bothering her  but only mildly - more sneezing. IN terms of dyspnea: still present especially with stairs, walking fast, being in a rush. Improved with rest and drinking water. Slowly getting worse. DEnies current cough. No other new problems. Also, feeling of fullness and dyspnea after eating. There is also some chest tightnes and hurt when yawnining; improves with water. Lot of questions on why she is dyspneic incluing if obesity is contributing.   Past, Family, Social reviewed: no change since last visit   REC  Please have methacholine challenge test for asthma  We will call you with results  If test positive, you will come in to start asthma treatment  IF test negative, will refer you to pulmonary rehab for weight related shortness of breath   OV 04/18/2012 Here to discuss results of methacholine challenge.No interim complaints. Interepreter is Lurena Joiner  positive methachole challenge test ofr 03/19/12  - good cooperation per RT. 24% Fev1 drop and 17% FVC drop at dose 0.25   #Shortness of breath  - this is due to asthma and weight  - for asthma Please start symbicort 80/4.5 2 puff twice daily - take sample, and show technique (script with refills done)  - I have referred you to pulmonary rehabilitation for shortness of breath exercises  #Weight management - I will discuss diet with you next visit  #Followup 6-8 weeks or sooner if needed    OV 06/24/2012 - followup dyspnea on basis  of asthma and obesity. Here with deaf mute intepreter.   - Reports improved dyspnea following starting symbicort > 90% compliance . Using albuterol for rescue only which she has used only occasionally; only once or two times per last week.  Feels she can sleep and rest better now but I am unable to sort out what is really better. Walking steps still a problem and in fact NOT improved. She is still though resorting to albuterol for rescue and drinking water. No associated weight loss. She has not attended rehab; says  Jamesetta So has not called back on phone calls.    - Obesity Body mass index is 42.43 kg/(m^2).: diet rviewed. Eating way too many high and medium glycemic foods. Eats very little low glycemic foods   #ASthma  - Continue symbicort 80/4.5 2 puff twice daily  #shortness of breath and obesity  - I have emailed Phyllis at rehab and will get back to you about starting there in rehabreferred you to pulmonary rehabilitation for shortness of breath exercises  #obesity  - follow [facility] low glycemic diet for weight loss  #Followup 4 weeks or sooner if needed   OV 08/05/2012 Followup asthma and obesity  Asthma: very well controlled on symbicort. Attending Rehab  OBesity: - weight 243# and Body mass index is 41.81 kg/(m^2). - down from BMI 42.43 last ov (down 10# since May 2013 and 4# since last OV). Weight loss is intentional. She is confused between my dietary advice and advice given at rehab. Therefore, she is eating lot of high glycemic foods like ripe bananas.        OV 10/02/2012  Estimated Body mass index is 42.69 kg/(m^2) as calculated from the following:   Height as of this encounter: 5\' 4" (1.626 m).   Weight as of this encounter: 248 lb 11.2 oz(112.81 kg).   ASthma - well controlled. No problems  Multifactrial dyspnea - resolved with rehab. Going to [employer] [facility]  Weight  - gained weight after last visit. Stil says she is confused about dietary advice between what I have instructed and what RD at rehab has instructed her. She is poorly compliant with the low glycemic foods I have outlined  #ASthma  - well controlled  - continue current medication regimen  #WEight  - follow the diet advice I have outlined; eat only foods in the left lane  #Followup  3 months  OV 02/03/2013  Followup for asthma. She continues on Symbicort 2 puff 2 times a day without any change. She uses albuterol for rescue once every few days. There no nocturnal awakenings or chest tightness. She does continue to  have her chronic dyspnea on exertion for which she was attending pulmonary rehabilitation until she fell down due to benign positional vertigo and sustained a hematoma in her right thigharound end February 2014. She is now doing well and back to baseline. In terms of weight loss she has not followed the diet and has not lost any weight. She has looked into bariatric surgery but found it too expensive and her insurance will not cover it . 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 Since  then asthma overall stable without any admissions or exacerbations. She continues to have good days and bad days but albuterol rescue use is limited to 3 times a week at the most. She has worsening dyspnea when she exerts herself like lifting heavy objects but this could easily be from obesity. She does take albuterol preemptively in these situations. No nocturnal awakening or wheezing.  she has not had a flu shot but will have it today  Spirometry today shows FEV1 1.37 L or 62%. FVC 1.5 L/56%. Ratio of 89: Shows continued stability with moderate airways obstruction   Review of Systems  Constitutional: Negative for fever and unexpected weight change.  HENT: Negative for ear pain, nosebleeds, congestion, sore throat, rhinorrhea, sneezing, trouble swallowing, dental problem, postnasal drip and sinus pressure.   Eyes: Negative for redness and itching.  Respiratory: Negative for cough, chest tightness, shortness of breath and wheezing.   Cardiovascular: Negative for palpitations and leg swelling.  Gastrointestinal: Negative for nausea and vomiting.  Genitourinary: Negative for dysuria.   Musculoskeletal: Negative for joint swelling.  Skin: Negative for rash.  Neurological: Negative for headaches.  Hematological: Does not bruise/bleed easily.  Psychiatric/Behavioral: Negative for dysphoric mood. The patient is not nervous/anxious.    Current outpatient prescriptions:albuterol (PROVENTIL HFA;VENTOLIN HFA) 108 (90 BASE) MCG/ACT inhaler, Inhale 2 puffs into the lungs every 6 (six) hours as needed for wheezing., Disp: 1 Inhaler, Rfl: 6;  budesonide-formoterol (SYMBICORT) 80-4.5 MCG/ACT inhaler, INHALE 2 PUFFS INTO THE LUNGS 2 (TWO) TIMES DAILY. FOR WHEEZING, Disp: 3 Inhaler, Rfl: 3;  Fiber CHEW, Chew 1 tablet by mouth daily., Disp: , Rfl:  ibuprofen (ADVIL,MOTRIN) 800 MG tablet, Take 1 tablet (800 mg total) by mouth 3 (three) times daily., Disp: 21 tablet, Rfl: 0;  Multiple Vitamin (MULTIVITAMIN) tablet, Take 1 tablet by mouth daily., Disp: , Rfl: ;  pantoprazole (PROTONIX) 40 MG tablet, Take 1 tablet (40 mg total) by mouth daily., Disp: 30 tablet, Rfl: 3;  meclizine (ANTIVERT) 25 MG tablet, Take 1 tablet (25 mg total) by mouth 3 (three) times daily as needed., Disp: 45 tablet, Rfl: 3 naproxen sodium (ANAPROX) 220 MG tablet, Take 220 mg by mouth every 6 (six) hours as needed., Disp: , Rfl: ;  oxyCODONE-acetaminophen (PERCOCET) 5-325 MG per tablet, Take 2 tablets by mouth every 4 (four) hours as needed for pain., Disp: 15 tablet, Rfl: 0     Objective:   Physical Exam Vitals reviewed. Constitutional: She is oriented to person, place, and time. She appears well-developed and well-nourished. No distress.  Speaks through a deaf mute interpreter  HENT:  Head: Normocephalic and atraumatic.  Right Ear: External ear normal.  Left Ear: External ear normal.  Mouth/Throat: Oropharynx is clear and moist. No oropharyngeal exudate.  Eyes: Conjunctivae and EOM are normal. Pupils are equal, round, and reactive to light. Right eye exhibits no discharge. Left eye exhibits no discharge. No scleral  icterus.  Neck: Normal range of motion. Neck supple. No JVD present. No tracheal deviation present. No thyromegaly present.  Cardiovascular: Normal rate, regular rhythm, normal heart sounds and intact distal pulses.  Exam reveals no gallop and no friction rub.   No murmur heard. Pulmonary/Chest: Effort normal and breath sounds normal. No respiratory distress. She has no wheezes. She has no rales. She exhibits no tenderness.  Abdominal: Soft. Bowel sounds are normal. She exhibits no distension and no mass. There is no tenderness. There is no rebound and no guarding.  Musculoskeletal: Normal range of motion. She exhibits no edema  and no tenderness.  Lymphadenopathy:    She has no cervical adenopathy.  Neurological: She is alert and oriented to person, place, and time. She has normal reflexes. No cranial nerve deficit. She exhibits normal muscle tone. Coordination normal.  Skin: Skin is warm and dry. No rash noted. She is not diaphoretic. No erythema. No pallor.  Psychiatric: She has a normal mood and affect. Her behavior is normal. Judgment and thought content normal.   Vitals reviewed. Constitutional: She is oriented to person, place, and time. She appears well-developed and well-nourished. No distress.       Obese Body mass index is 42.64 kg/(m^2). On 06/24/12 Body mass index is 41.81 kg/(m^2). on 08/05/2012 Body mass index is 42.69 kg/(m^2). on 10/02/2012 Body mass index is 42.89 kg/(m^2). on 02/03/2013 Body mass index is 44.64 kg/(m^2). on 07/21/2013          Assessment & Plan:

## 2013-07-21 NOTE — Assessment & Plan Note (Signed)
Asthma stable Continue symbicort 80/4.5, 2 puff twice daily Use albuterol as needed Flu shot 07/21/2013 REturn in 6 months

## 2013-07-21 NOTE — Patient Instructions (Addendum)
Asthma stable Continue symbicort 80/4.5, 2 puff twice daily Use albuterol as needed Flu shot 07/21/2013 REturn in 6 months  

## 2013-07-23 ENCOUNTER — Encounter: Payer: Self-pay | Admitting: Family Medicine

## 2013-07-23 ENCOUNTER — Ambulatory Visit (INDEPENDENT_AMBULATORY_CARE_PROVIDER_SITE_OTHER): Payer: No Typology Code available for payment source | Admitting: Family Medicine

## 2013-07-23 VITALS — BP 126/84 | HR 85 | Temp 97.9°F | Ht 63.25 in | Wt 248.6 lb

## 2013-07-23 DIAGNOSIS — S7012XA Contusion of left thigh, initial encounter: Secondary | ICD-10-CM

## 2013-07-23 DIAGNOSIS — S7010XA Contusion of unspecified thigh, initial encounter: Secondary | ICD-10-CM

## 2013-07-23 MED ORDER — TRAMADOL HCL 50 MG PO TABS: 50.0000 mg | ORAL_TABLET | Freq: Three times a day (TID) | ORAL | Status: DC | PRN

## 2013-07-23 NOTE — Patient Instructions (Addendum)
Go to [facility] now for your ultrasound Alternate heat and ice for pain relief Take the tramadol as needed for pain We'll notify you of your results Call with any questions or concerns Hang in there!!!

## 2013-07-23 NOTE — Progress Notes (Signed)
  Subjective:    Patient ID: Rhonda Morrow, female    DOB: 03-16-1961, 52 y.o.   MRN: [id number]  HPI ER f/u- pt fell at work on 9/12 and hit her head.  Went to ER w/ HA and hematoma of R forehead.  Also had hamstring strain.  Had normal head CT.  Was given prescription for narcotic pain meds but this made her sick.  Taking Motrin 800mg  w/ some relief.  Head is feeling better- still using ice.  Very tender to touch.  L hamstring still very painful- no improvement w/ ibuprofen.  Limping.  Difficulty sleeping.  No obvious bruising that pt can see- pain feels internal to muscle.   Review of Systems For ROS see HPI     Objective:   Physical Exam  Vitals reviewed. Constitutional: She is oriented to person, place, and time. She appears well-developed and well-nourished. No distress.  HENT:  Head: Normocephalic and atraumatic.  No obvious hematoma of forehead  Cardiovascular: Intact distal pulses.   Musculoskeletal:  Large hematoma and extensive bruising of L hamstring.  + TTP Antalgic gait  Neurological: She is alert and oriented to person, place, and time.  Skin: Skin is warm and dry.          Assessment & Plan:

## 2013-07-28 NOTE — Assessment & Plan Note (Signed)
New.  Now w/ extensive hematoma and bruising of L hamstring.  Get [place] to r/o DVT.  Encouraged ice and heat.  Start tramadol for pain.  Will follow closely.  Reviewed supportive care and red flags that should prompt return.  Pt expressed understanding and is in agreement w/ plan.

## 2013-07-30 ENCOUNTER — Telehealth: Payer: Self-pay | Admitting: Family Medicine

## 2013-07-30 ENCOUNTER — Other Ambulatory Visit: Payer: Self-pay | Admitting: Family Medicine

## 2013-07-30 NOTE — Telephone Encounter (Signed)
07/30/2013  Pt came by to request refill on the ibuprofen 800mg .  Please call in to [facility] [address].  Please call her when sent.  bw

## 2013-07-31 NOTE — Telephone Encounter (Signed)
Rx was just filled on 07/23/2013

## 2013-08-01 ENCOUNTER — Telehealth: Payer: Self-pay | Admitting: *Deleted

## 2013-08-01 NOTE — Telephone Encounter (Signed)
Duty Status Report received from [facility] Comp to complete for pts work injury that occurred on 07/18/13. Spoke with Dr. Beverely Low who evaluated pt after injury occurred and was advised that pt would have to see a doctor approved by the post office as this is a workman's comp claim and we are not qualified to assess fitness for duty with those types of claims.    Patient notified via voice mail to return my call. Paperwork placed up front for pt to pick up.

## 2013-08-05 MED ORDER — IBUPROFEN 800 MG PO TABS: 800.0000 mg | ORAL_TABLET | Freq: Three times a day (TID) | ORAL | Status: DC | PRN

## 2013-08-05 NOTE — Telephone Encounter (Signed)
Med filled.  

## 2013-08-05 NOTE — Telephone Encounter (Signed)
Ok for #60, no refills- 1 tab q8 PRN

## 2013-08-12 ENCOUNTER — Ambulatory Visit: Payer: Self-pay | Admitting: Adult Health

## 2013-08-12 ENCOUNTER — Ambulatory Visit (INDEPENDENT_AMBULATORY_CARE_PROVIDER_SITE_OTHER): Payer: No Typology Code available for payment source | Admitting: Gastroenterology

## 2013-08-12 ENCOUNTER — Encounter: Payer: Self-pay | Admitting: Gastroenterology

## 2013-08-12 VITALS — BP 138/84 | HR 80 | Ht 63.25 in | Wt 245.0 lb

## 2013-08-12 DIAGNOSIS — R1314 Dysphagia, pharyngoesophageal phase: Secondary | ICD-10-CM

## 2013-08-12 DIAGNOSIS — Z1211 Encounter for screening for malignant neoplasm of colon: Secondary | ICD-10-CM

## 2013-08-12 MED ORDER — MOVIPREP 100 G PO SOLR: 1.0000 | Freq: Once | ORAL | Status: DC

## 2013-08-12 NOTE — Progress Notes (Signed)
HPI: This is a    very pleasant 52 year old woman whom I am meeting for the first time today.  Water had trouble going down.  Intermittent.  Solid food issue as well.  Has tried GI cocktail, and it helped.  The dysphagia has been going on for a few months.  Vomited water even.  Liquids usually, about once per week.  Has pyrosis, at times, takes protonix for about 6 weeks daily.  Helps sometimes.  Weight has been up and down.  Never had EGD. Never had colonoscopy.  she does not have any overt GI bleeding and her bowels are normal for her.  She is nervous about any endoscopic procedures.  She is deaf, a sign language interpreter was present.   Review of systems: Pertinent positive and negative review of systems were noted in the above HPI section. Complete review of systems was performed and was otherwise normal.    Past Medical History  Diagnosis Date  . Pre-diabetes   . Hypertension   . Deaf   . Asthma   . Thyroid disease     Past Surgical History  Procedure Laterality Date  . Cesarean section    . Cardiac catheterization      Current Outpatient Prescriptions  Medication Sig Dispense Refill  . albuterol (PROVENTIL HFA;VENTOLIN HFA) 108 (90 BASE) MCG/ACT inhaler Inhale 2 puffs into the lungs every 6 (six) hours as needed for wheezing.  1 Inhaler  6  . budesonide-formoterol (SYMBICORT) 80-4.5 MCG/ACT inhaler INHALE 2 PUFFS INTO THE LUNGS 2 (TWO) TIMES DAILY. FOR WHEEZING  3 Inhaler  3  . Fiber CHEW Chew 1 tablet by mouth daily.      Marland Kitchen ibuprofen (ADVIL,MOTRIN) 800 MG tablet Take 1 tablet (800 mg total) by mouth every 8 (eight) hours as needed for pain.  21 tablet  0  . meclizine (ANTIVERT) 25 MG tablet Take 1 tablet (25 mg total) by mouth 3 (three) times daily as needed.  45 tablet  3  . Multiple Vitamin (MULTIVITAMIN) tablet Take 1 tablet by mouth daily.      . naproxen sodium (ANAPROX) 220 MG tablet Take 220 mg by mouth every 6 (six) hours as needed.      . pantoprazole  (PROTONIX) 40 MG tablet Take 1 tablet (40 mg total) by mouth daily.  30 tablet  3  . traMADol (ULTRAM) 50 MG tablet Take 1 tablet (50 mg total) by mouth every 8 (eight) hours as needed for pain.  30 tablet  0   No current facility-administered medications for this visit.    Allergies as of 08/12/2013 - Review Complete 08/12/2013  Allergen Reaction Noted  . Augmentin [amoxicillin-pot clavulanate] Diarrhea 03/25/2013  . Oxycodone-acetaminophen Nausea And Vomiting 07/23/2013    Family History  Problem Relation Age of Onset  . Diabetes Mother   . Diabetes Father   . Heart disease Mother     History   Social History  . Marital Status: Married    Spouse Name: N/A    Number of Children: N/A  . Years of Education: N/A   Occupational History  . diabled    Social History Main Topics  . Smoking status: Never Smoker   . Smokeless tobacco: Never Used  . Alcohol Use: No  . Drug Use: No  . Sexual Activity: Not on file   Other Topics Concern  . Not on file   Social History Narrative  . No narrative on file       Physical Exam:  BP 138/84  Pulse 80  Ht 5' 3.25" (1.607 m)  Wt 245 lb (111.131 kg)  BMI 43.03 kg/m2 Constitutional: generally well-appearing Psychiatric: alert and oriented x3 Eyes: extraocular movements intact Mouth: oral pharynx moist, no lesions Neck: supple no lymphadenopathy Cardiovascular: heart regular rate and rhythm Lungs: clear to auscultation bilaterally Abdomen: soft, nontender, nondistended, no obvious ascites, no peritoneal signs, normal bowel sounds Extremities: no lower extremity edema bilaterally Skin: no lesions on visible extremities    Assessment and plan: 52 y.o. female with  recent liquid, solid food dysphagia without associated weight loss or overt GI bleeding. Routine risk for colon cancer  I will proceed with EGD at her soonest convenience to evaluate her liquid predominant dysphasia. This may be GERD related. She might have a  Schatzki's ring or peptic stricture. My suspicion for neoplasm is low. She'll be turning 52 in less than a month and so we will proceed with routine screening colonoscopy at the same time.

## 2013-08-12 NOTE — Patient Instructions (Addendum)
You will be set up for an upper endoscopy for dysphagia (swallowing trouble), LEC, MAC sedation. You will be set up for a colonoscopy for routine screening at same time.

## 2013-08-14 ENCOUNTER — Encounter: Payer: Self-pay | Admitting: Family Medicine

## 2013-08-15 ENCOUNTER — Ambulatory Visit (INDEPENDENT_AMBULATORY_CARE_PROVIDER_SITE_OTHER)
Admission: RE | Admit: 2013-08-15 | Discharge: 2013-08-15 | Disposition: A | Discharge status: Home / Self Care | Payer: No Typology Code available for payment source | Source: Ambulatory Visit | Attending: Adult Health | Admitting: Adult Health

## 2013-08-15 ENCOUNTER — Ambulatory Visit (INDEPENDENT_AMBULATORY_CARE_PROVIDER_SITE_OTHER): Payer: No Typology Code available for payment source | Admitting: Adult Health

## 2013-08-15 ENCOUNTER — Encounter: Payer: Self-pay | Admitting: Gastroenterology

## 2013-08-15 ENCOUNTER — Telehealth: Payer: Self-pay | Admitting: Gastroenterology

## 2013-08-15 ENCOUNTER — Encounter: Payer: Self-pay | Admitting: Adult Health

## 2013-08-15 VITALS — BP 140/84 | HR 80 | Temp 98.2°F | Ht 62.25 in | Wt 248.2 lb

## 2013-08-15 DIAGNOSIS — J45909 Unspecified asthma, uncomplicated: Secondary | ICD-10-CM

## 2013-08-15 DIAGNOSIS — J4530 Mild persistent asthma, uncomplicated: Secondary | ICD-10-CM

## 2013-08-15 NOTE — Addendum Note (Signed)
Addended by: Boone Master E on: 08/15/2013 09:50 AM   Modules accepted: Orders

## 2013-08-15 NOTE — Assessment & Plan Note (Signed)
Asthma flare ? GERD related. , exam is unrevealing  Check xray today  Hold on steroids for now as no active wheezing .   Plan  Add Pepcid 20mg  At bedtime   GERD diet  Continue on Protonix daily  I will call with xray results.  Increase Symbicort 160/4.12mcg 2 puffs Twice daily  Until sample is gone then go back to Symbicort 80 Begin Allegra 180mg  daily for 1 week then As needed  For drainage.

## 2013-08-15 NOTE — Patient Instructions (Addendum)
Add Pepcid 20mg  At bedtime   GERD diet  Continue on Protonix daily  I will call with xray results.  Increase Symbicort 160/4.26mcg 2 puffs Twice daily  Until sample is gone then go back to Symbicort 80 Begin Allegra 180mg  daily for 1 week then As needed  For drainage.  follow up Dr. Marchelle Gearing in 4 weeks and As needed   Please contact office for sooner follow up if symptoms do not improve or worsen or seek emergency care

## 2013-08-15 NOTE — Telephone Encounter (Signed)
Pt was notified that the tramadol is prescribed by Dr Beverely Low and the [facility]t.  Pt will call and check on that

## 2013-08-15 NOTE — Progress Notes (Signed)
Subjective:    Patient ID: Rhonda Morrow, female    DOB: 14-Dec-1960, 52 y.o.   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 Here with interpreter.  reports that she has never smoked. She does not have any smokeless tobacco history on file. Body mass index is 42.64 kg/(m^2). Husband is patient of Dr Shelle Iron and has O2 dependency though is never smoker. She works in [employer]   Reports dyspnea. Especially with exertion walking or hurrying and walking and climbing steps. Stops half way through steps. Dyspnea also made worse by sitting in small cars. No dyspnea for eating or change of clothes. Relieved by rest and drinking water or sitting in large SUV/Van. Feels air hungry. INsidious onset. Slowly progressive. Says people can hear her dyspneic. Associated symptoms: 20# weight gain x 1 year,  occasional cough only during URI, wheezing, popping sounds occasionally in ear, hx of occasional URI and chest tightness. Says chest tightness improved by prevacid. Uses 3 pillows to sleep for years. Unclear if there is  paroxysmal nocturnal dyspnea or not. 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 Denies family hx of asthma. No pet birds. Has dog outside. No cats. No mold exposure but maybe in bathtub (not sure)  Walking desaturation test 185 feet x 3 laps"   REC Do not know why you are short of breath  Please have breathing test called PFT  BAsed on those test results I will call you and decide next step   OV 03/11/2012 Here to review test results. Interpreter is Rebecca [place]; patient deaf and mute  PFt date 01/24/12 shows mixed obstruction - restriction but greater restriction with low dlco   - fev1 1/48L/58%, Ratio 82, TLC 56,  DLCO 14/51%, No BD response  CT chest 02/09/12:  The ct chest does not show any scarring.     In terms of symptoms: current wet, cold, rainy weather bothering her  but only mildly - more sneezing. IN terms of dyspnea: still present especially with stairs, walking fast, being in a rush. Improved with rest and drinking water. Slowly getting worse. DEnies current cough. No other new problems. Also, feeling of fullness and dyspnea after eating. There is also some chest tightnes and hurt when yawnining; improves with water. Lot of questions on why she is dyspneic incluing if obesity is contributing.   Past, Family, Social reviewed: no change since last visit   REC  Please have methacholine challenge test for asthma  We will call you with results  If test positive, you will come in to start asthma treatment  IF test negative, will refer you to pulmonary rehab for weight related shortness of breath   OV 04/18/2012 Here to discuss results of methacholine challenge.No interim complaints. Interepreter is Lurena Joiner  positive methachole challenge test ofr 03/19/12  - good cooperation per RT. 24% Fev1 drop and 17% FVC drop at dose 0.25   #Shortness of breath  - this is due to asthma and weight  - for asthma Please start symbicort 80/4.5 2 puff twice daily - take sample, and show technique (script with refills done)  - I have referred you to pulmonary rehabilitation for shortness of breath exercises  #Weight management - I will discuss diet with you next visit  #Followup 6-8 weeks or sooner if needed    OV 06/24/2012 - followup dyspnea on basis  of asthma and obesity. Here with deaf mute intepreter.   - Reports improved dyspnea following starting symbicort > 90% compliance . Using albuterol for rescue only which she has used only occasionally; only once or two times per last week.  Feels she can sleep and rest better now but I am unable to sort out what is really better. Walking steps still a problem and in fact NOT improved. She is still though resorting to albuterol for rescue and drinking water. No associated weight loss. She has not attended rehab; says  Jamesetta So has not called back on phone calls.    - Obesity Body mass index is 42.43 kg/(m^2).: diet rviewed. Eating way too many high and medium glycemic foods. Eats very little low glycemic foods   #ASthma  - Continue symbicort 80/4.5 2 puff twice daily  #shortness of breath and obesity  - I have emailed Phyllis at rehab and will get back to you about starting there in rehabreferred you to pulmonary rehabilitation for shortness of breath exercises  #obesity  - follow [facility] low glycemic diet for weight loss  #Followup 4 weeks or sooner if needed   OV 08/05/2012 Followup asthma and obesity  Asthma: very well controlled on symbicort. Attending Rehab  OBesity: - weight 243# and Body mass index is 41.81 kg/(m^2). - down from BMI 42.43 last ov (down 10# since May 2013 and 4# since last OV). Weight loss is intentional. She is confused between my dietary advice and advice given at rehab. Therefore, she is eating lot of high glycemic foods like ripe bananas.        OV 10/02/2012  Estimated Body mass index is 42.69 kg/(m^2) as calculated from the following:   Height as of this encounter: 5\' 4" (1.626 m).   Weight as of this encounter: 248 lb 11.2 oz(112.81 kg).   ASthma - well controlled. No problems  Multifactrial dyspnea - resolved with rehab. Going to [employer]  Weight  - gained weight after last visit. Stil says she is confused about dietary advice between what I have instructed and what RD at rehab has instructed her. She is poorly compliant with the low glycemic foods I have outlined  #ASthma  - well controlled  - continue current medication regimen  #WEight  - follow the diet advice I have outlined; eat only foods in the left lane  #Followup  3 months  OV 02/03/2013  Followup for asthma. She continues on Symbicort 2 puff 2 times a day without any change. She uses albuterol for rescue once every few days. There no nocturnal awakenings or chest tightness. She does continue to  have her chronic dyspnea on exertion for which she was attending pulmonary rehabilitation until she fell down due to benign positional vertigo and sustained a hematoma in her right thigharound end February 2014. She is now doing well and back to baseline. In terms of weight loss she has not followed the diet and has not lost any weight. She has looked into bariatric surgery but found it too expensive and her insurance will not cover it . 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 Since  then asthma overall stable without any admissions or exacerbations. She continues to have good days and bad days but albuterol rescue use is limited to 3 times a week at the most. She has worsening dyspnea when she exerts herself like lifting heavy objects but this could easily be from obesity. She does take albuterol preemptively in these situations. No nocturnal awakening or wheezing.  she has not had a flu shot but will have it today  Spirometry today shows FEV1 1.37 L or 62%. FVC 1.5 L/56%. Ratio of 89: Shows continued stability with moderate airways obstruction >>no changes   08/15/2013 Acute OV  Complains of increased SOB, chest tightness x5days , mild cough and sneezing. Having heartburn and indigestion, esp at night despite protonix daily . Seen by GI few days ago , set up for endoscopy for dysphagia. No fever, discolored mucus, orthopnea, PNA, leg swelling, calf pain . Has taken her Ventolin with relief. Symptoms are worse at night , feels something come into chest . No vomiting. No missed doses of symbicort.  Took Allegra yesterday for sneezing. Son has been sick w/ cold like symptoms.     Review of Systems  Constitutional: Negative for fever and unexpected weight change.  HENT: Negative for ear pain, nosebleeds, sore throat, rhinorrhea, sneezing,  dental problem, sinus pressure.   Eyes: Negative for redness and itching.  Respiratory: Negative for cough, chest tightness, shortness of breath and wheezing.   Cardiovascular: Negative for palpitations and leg swelling.  Gastrointestinal: Negative for nausea and vomiting.  Genitourinary: Negative for dysuria.  Musculoskeletal: Negative for joint swelling.  Skin: Negative for rash.  Neurological: Negative for headaches.  Hematological: Does not bruise/bleed easily.  Psychiatric/Behavioral: Negative for dysphoric mood. The patient is not nervous/anxious.       Objective:   Physical Exam Vitals reviewed. Constitutional: She is oriented to person, place, and time. She appears well-developed and well-nourished. No distress.  Speaks through a deaf mute interpreter  HENT:  Head: Normocephalic and atraumatic.  Right Ear: External ear normal.  Left Ear: External ear normal.  Mouth/Throat: Oropharynx is clear and moist. No oropharyngeal exudate.  Eyes: Conjunctivae and EOM are normal. Pupils are equal, round, and reactive to light. Right eye exhibits no discharge. Left eye exhibits no discharge. No scleral icterus.  Neck: Normal range of motion. Neck supple. No JVD present. No tracheal deviation present. No thyromegaly present.  Cardiovascular: Normal rate, regular rhythm, normal heart sounds and intact distal pulses.  Exam reveals no gallop and no friction rub.  Neg homans sign  No murmur heard. Pulmonary/Chest: Effort normal and breath sounds normal. No respiratory distress. She has no wheezes. She has no rales. She exhibits no tenderness.  Abdominal: Soft. Bowel sounds are normal. She exhibits no distension and no mass. There is no tenderness. There is no rebound and no guarding.  Musculoskeletal: Normal range of motion. She  exhibits no edema and no tenderness.  Lymphadenopathy:    She has no cervical adenopathy.  Neurological: She is alert and oriented to person, place, and time. She has normal reflexes. No cranial nerve deficit. She exhibits normal muscle tone. Coordination normal.  Skin: Skin is warm and dry. No rash noted. She is not diaphoretic. No erythema. No pallor.  Psychiatric: She has a normal mood and affect. Her behavior is normal. Judgment and thought content normal.   Vitals reviewed. Constitutional: She is oriented to person, place, and time. She appears well-developed and well-nourished. No distress.  Assessment & Plan:

## 2013-08-19 ENCOUNTER — Telehealth: Payer: Self-pay

## 2013-08-19 ENCOUNTER — Encounter: Payer: Self-pay | Admitting: Lab

## 2013-08-19 NOTE — Telephone Encounter (Addendum)
LM with female for Ms. Emory to [employer] concerning daughter. Unable to reach prior to visit

## 2013-08-20 ENCOUNTER — Encounter: Payer: Self-pay | Admitting: Family Medicine

## 2013-08-20 ENCOUNTER — Ambulatory Visit (INDEPENDENT_AMBULATORY_CARE_PROVIDER_SITE_OTHER): Payer: No Typology Code available for payment source | Admitting: Family Medicine

## 2013-08-20 ENCOUNTER — Encounter: Payer: Self-pay | Admitting: General Practice

## 2013-08-20 VITALS — BP 120/82 | HR 93 | Temp 98.1°F | Resp 16 | Ht 64.0 in | Wt 246.0 lb

## 2013-08-20 DIAGNOSIS — Z1231 Encounter for screening mammogram for malignant neoplasm of breast: Secondary | ICD-10-CM

## 2013-08-20 DIAGNOSIS — Z0000 Encounter for general adult medical examination without abnormal findings: Secondary | ICD-10-CM

## 2013-08-20 DIAGNOSIS — Z23 Encounter for immunization: Secondary | ICD-10-CM

## 2013-08-20 DIAGNOSIS — Z0289 Encounter for other administrative examinations: Secondary | ICD-10-CM

## 2013-08-20 LAB — LIPID PANEL
Cholesterol: 155 mg/dL (ref 0–200)
HDL: 51.8 mg/dL (ref >39.00)
LDL Cholesterol: 83 mg/dL (ref 0–99)
Total CHOL/HDL Ratio: 3
Triglycerides: 101 mg/dL (ref 0.0–149.0)
VLDL: 20.2 mg/dL (ref 0.0–40.0)

## 2013-08-20 LAB — HEPATIC FUNCTION PANEL
Bilirubin, Direct: 0 mg/dL (ref 0.0–0.3)
Total Bilirubin: 0.2 mg/dL — ABNORMAL LOW (ref 0.3–1.2)

## 2013-08-20 LAB — CBC WITH DIFFERENTIAL/PLATELET
Basophils Absolute: 0 10*3/uL (ref 0.0–0.1)
Basophils Relative: 0.4 % (ref 0.0–3.0)
Eosinophils Absolute: 0.1 10*3/uL (ref 0.0–0.7)
Eosinophils Relative: 1.1 % (ref 0.0–5.0)
HCT: 41.6 % (ref 36.0–46.0)
Hemoglobin: 13.6 g/dL (ref 12.0–15.0)
Lymphocytes Relative: 17.1 % (ref 12.0–46.0)
Lymphs Abs: 1.2 10*3/uL (ref 0.7–4.0)
MCHC: 32.8 g/dL (ref 30.0–36.0)
MCV: 82.4 fl (ref 78.0–100.0)
Monocytes Absolute: 0.4 10*3/uL (ref 0.1–1.0)
Monocytes Relative: 6.2 % (ref 3.0–12.0)
Neutro Abs: 5.4 10*3/uL (ref 1.4–7.7)
Neutrophils Relative %: 75.2 % (ref 43.0–77.0)
Platelets: 212 10*3/uL (ref 150.0–400.0)
RBC: 5.05 Mil/uL (ref 3.87–5.11)
RDW: 14.3 % (ref 11.5–14.6)
WBC: 7.2 10*3/uL (ref 4.5–10.5)

## 2013-08-20 LAB — BASIC METABOLIC PANEL
BUN: 10 mg/dL (ref 6–23)
CO2: 29 mEq/L (ref 19–32)
Calcium: 9.1 mg/dL (ref 8.4–10.5)
Chloride: 99 mEq/L (ref 96–112)
Creatinine, Ser: 1 mg/dL (ref 0.4–1.2)
GFR: 79.13 mL/min (ref >60.00)
Glucose, Bld: 153 mg/dL — ABNORMAL HIGH (ref 70–99)
Potassium: 2.9 mEq/L — ABNORMAL LOW (ref 3.5–5.1)
Sodium: 138 mEq/L (ref 135–145)

## 2013-08-20 LAB — TSH: TSH: 0.94 u[IU]/mL (ref 0.35–5.50)

## 2013-08-20 NOTE — Progress Notes (Signed)
  Subjective:    Patient ID: Rhonda Morrow, female    DOB: May 21, 1961, 53 y.o.   MRN: [id number]  HPI PE- UTD on pap.  Due for mammo.   Review of Systems Patient reports no vision/ hearing changes, adenopathy,fever, weight change,  persistant/recurrent hoarseness , swallowing issues, chest pain, palpitations, edema, persistant/recurrent cough, hemoptysis, dyspnea (rest/exertional/paroxysmal nocturnal), gastrointestinal bleeding (melena, rectal bleeding), abdominal pain, significant heartburn, bowel changes, GU symptoms (dysuria, hematuria, incontinence), Gyn symptoms (abnormal  bleeding, pain),  syncope, focal weakness, memory loss, numbness & tingling, skin/hair/nail changes, abnormal bruising or bleeding, anxiety, or depression.     Objective:   Physical Exam  General Appearance:    Alert, cooperative, no distress, appears stated age  Head:    Normocephalic, without obvious abnormality, atraumatic  Eyes:    PERRL, conjunctiva/corneas clear, EOM's intact, fundi    benign, both eyes  Ears:    Normal TM's and external ear canals, both ears  Nose:   Nares normal, septum midline, mucosa normal, no drainage    or sinus tenderness  Throat:   Lips, mucosa, and tongue normal; teeth and gums normal  Neck:   Supple, symmetrical, trachea midline, no adenopathy;    Thyroid: thyromegaly  Back:     Symmetric, no curvature, ROM normal, no CVA tenderness  Lungs:     Clear to auscultation bilaterally, respirations unlabored  Chest Wall:    No tenderness or deformity   Heart:    Regular rate and rhythm, S1 and S2 normal, no murmur, rub   or gallop  Breast Exam:    No tenderness, masses, or nipple abnormality  Abdomen:     Soft, non-tender, bowel sounds active all four quadrants,    no masses, no organomegaly  Genitalia:    deferred  Rectal:    Extremities:   Extremities normal, atraumatic, no cyanosis or edema  Pulses:   2+ and symmetric all extremities  Skin:   Skin color, texture, turgor normal, no  rashes or lesions  Lymph nodes:   Cervical, supraclavicular, and axillary nodes normal  Neurologic:   CNII-XII intact, normal strength, sensation and reflexes    throughout          Assessment & Plan:

## 2013-08-20 NOTE — Assessment & Plan Note (Signed)
Pt's PE WNL w/ exception of obesity.  Refer for mammo.  Has colonoscopy upcoming.  UTD on pap (2013).  Check labs.  Anticipatory guidance provided.

## 2013-08-20 NOTE — Patient Instructions (Signed)
Follow up in 6 months to recheck BP We'll notify you of your lab results and make any changes if needed We'll call you with your mammo appt Continue to make healthy food choices and try and get regular exercise Call with any questions or concerns Happy Early Birthday!!!

## 2013-08-21 ENCOUNTER — Ambulatory Visit: Payer: No Typology Code available for payment source

## 2013-08-21 DIAGNOSIS — R7309 Other abnormal glucose: Secondary | ICD-10-CM

## 2013-08-22 ENCOUNTER — Other Ambulatory Visit: Payer: Self-pay | Admitting: Family Medicine

## 2013-08-22 ENCOUNTER — Encounter: Payer: Self-pay | Admitting: General Practice

## 2013-08-22 ENCOUNTER — Other Ambulatory Visit: Payer: Self-pay | Admitting: General Practice

## 2013-08-22 DIAGNOSIS — E876 Hypokalemia: Secondary | ICD-10-CM

## 2013-08-22 MED ORDER — POTASSIUM CHLORIDE CRYS ER 20 MEQ PO TBCR: 20.0000 meq | EXTENDED_RELEASE_TABLET | Freq: Every day | ORAL | Status: DC

## 2013-08-24 LAB — VITAMIN D 1,25 DIHYDROXY: Vitamin D2 1, 25 (OH)2: <8 pg/mL

## 2013-09-01 ENCOUNTER — Telehealth: Payer: Self-pay | Admitting: Internal Medicine

## 2013-09-01 ENCOUNTER — Ambulatory Visit (INDEPENDENT_AMBULATORY_CARE_PROVIDER_SITE_OTHER): Payer: No Typology Code available for payment source | Admitting: Adult Health

## 2013-09-01 ENCOUNTER — Encounter: Payer: Self-pay | Admitting: Adult Health

## 2013-09-01 VITALS — BP 116/78 | HR 79 | Temp 97.9°F | Ht 62.25 in | Wt 243.6 lb

## 2013-09-01 DIAGNOSIS — J45909 Unspecified asthma, uncomplicated: Secondary | ICD-10-CM

## 2013-09-01 DIAGNOSIS — J4530 Mild persistent asthma, uncomplicated: Secondary | ICD-10-CM

## 2013-09-01 MED ORDER — BUDESONIDE-FORMOTEROL FUMARATE 160-4.5 MCG/ACT IN AERO: 2.0000 | INHALATION_SPRAY | Freq: Two times a day (BID) | RESPIRATORY_TRACT | Status: DC

## 2013-09-01 NOTE — Telephone Encounter (Signed)
Pt calling again in ref to previous msg can be reached at [phone].Rhonda Morrow

## 2013-09-01 NOTE — Patient Instructions (Addendum)
Restart Symbicort 160/4.75mcg 2 puffs Twice daily  Continue on Pepcid 20mg  At bedtime   GERD diet  Continue on Protonix daily  Restart Allegra 180mg  daily for 1 week then As needed  For drainage.  follow up Dr. Marchelle Gearing in 4 weeks and As needed   Please contact office for sooner follow up if symptoms do not improve or worsen or seek emergency care

## 2013-09-01 NOTE — Telephone Encounter (Signed)
I spoke with pt. She reports she is struggling to catch her breathe this am. She reports she has used her symbicort and rescue inhaler. Her mother is with her now to interpret for OV. Per JJ okay to use her held spot for pt. Pt then got disconnected and when the call center tried calling her back they were not able to reach her.  He tried calling her back. I had to leave a message. Will await pt call back

## 2013-09-01 NOTE — Telephone Encounter (Signed)
appt set for today at 3:45pm. Interpreter requested.  Carron Curie, CMA

## 2013-09-02 ENCOUNTER — Encounter: Payer: Self-pay | Admitting: Adult Health

## 2013-09-02 ENCOUNTER — Encounter: Payer: Self-pay | Admitting: Internal Medicine

## 2013-09-02 NOTE — Progress Notes (Signed)
Subjective:    Patient ID: Rhonda Morrow, female    DOB: 07-29-1961, 52 y.o.   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 Here with interpreter.  reports that she has never smoked. She does not have any smokeless tobacco history on file. Body mass index is 42.64 kg/(m^2). Husband is patient of Dr Shelle Iron and has O2 dependency though is never smoker. She works in [employer]   Reports dyspnea. Especially with exertion walking or hurrying and walking and climbing steps. Stops half way through steps. Dyspnea also made worse by sitting in small cars. No dyspnea for eating or change of clothes. Relieved by rest and drinking water or sitting in large SUV/Van. Feels air hungry. INsidious onset. Slowly progressive. Says people can hear her dyspneic. Associated symptoms: 20# weight gain x 1 year,  occasional cough only during URI, wheezing, popping sounds occasionally in ear, hx of occasional URI and chest tightness. Says chest tightness improved by prevacid. Uses 3 pillows to sleep for years. Unclear if there is  paroxysmal nocturnal dyspnea or not. 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 Denies family hx of asthma. No pet birds. Has dog outside. No cats. No mold exposure but maybe in bathtub (not sure)  Walking desaturation test 185 feet x 3 laps"   REC Do not know why you are short of breath  Please have breathing test called PFT  BAsed on those test results I will call you and decide next step   OV 03/11/2012 Here to review test results. Interpreter is Rebecca [place]; patient deaf and mute  PFt date 01/24/12 shows mixed obstruction - restriction but greater restriction with low dlco   - fev1 1/48L/58%, Ratio 82, TLC 56,  DLCO 14/51%, No BD response  CT chest 02/09/12:  The ct chest does not show any scarring.     In terms of symptoms: current wet, cold, rainy weather bothering her  but only mildly - more sneezing. IN terms of dyspnea: still present especially with stairs, walking fast, being in a rush. Improved with rest and drinking water. Slowly getting worse. DEnies current cough. No other new problems. Also, feeling of fullness and dyspnea after eating. There is also some chest tightnes and hurt when yawnining; improves with water. Lot of questions on why she is dyspneic incluing if obesity is contributing.   Past, Family, Social reviewed: no change since last visit   REC  Please have methacholine challenge test for asthma  We will call you with results  If test positive, you will come in to start asthma treatment  IF test negative, will refer you to pulmonary rehab for weight related shortness of breath   OV 04/18/2012 Here to discuss results of methacholine challenge.No interim complaints. Interepreter is Lurena Joiner  positive methachole challenge test ofr 03/19/12  - good cooperation per RT. 24% Fev1 drop and 17% FVC drop at dose 0.25   #Shortness of breath  - this is due to asthma and weight  - for asthma Please start symbicort 80/4.5 2 puff twice daily - take sample, and show technique (script with refills done)  - I have referred you to pulmonary rehabilitation for shortness of breath exercises  #Weight management - I will discuss diet with you next visit  #Followup 6-8 weeks or sooner if needed    OV 06/24/2012 - followup dyspnea on basis  of asthma and obesity. Here with deaf mute intepreter.   - Reports improved dyspnea following starting symbicort > 90% compliance . Using albuterol for rescue only which she has used only occasionally; only once or two times per last week.  Feels she can sleep and rest better now but I am unable to sort out what is really better. Walking steps still a problem and in fact NOT improved. She is still though resorting to albuterol for rescue and drinking water. No associated weight loss. She has not attended rehab; says  Jamesetta So has not called back on phone calls.    - Obesity Body mass index is 42.43 kg/(m^2).: diet rviewed. Eating way too many high and medium glycemic foods. Eats very little low glycemic foods   #ASthma  - Continue symbicort 80/4.5 2 puff twice daily  #shortness of breath and obesity  - I have emailed Phyllis at rehab and will get back to you about starting there in rehabreferred you to pulmonary rehabilitation for shortness of breath exercises  #obesity  - follow [facility] low glycemic diet for weight loss  #Followup 4 weeks or sooner if needed   OV 08/05/2012 Followup asthma and obesity  Asthma: very well controlled on symbicort. Attending Rehab  OBesity: - weight 243# and Body mass index is 41.81 kg/(m^2). - down from BMI 42.43 last ov (down 10# since May 2013 and 4# since last OV). Weight loss is intentional. She is confused between my dietary advice and advice given at rehab. Therefore, she is eating lot of high glycemic foods like ripe bananas.        OV 10/02/2012  Estimated Body mass index is 42.69 kg/(m^2) as calculated from the following:   Height as of this encounter: 5\' 4" (1.626 m).   Weight as of this encounter: 248 lb 11.2 oz(112.81 kg).   ASthma - well controlled. No problems  Multifactrial dyspnea - resolved with rehab. Going to [employer]  Weight  - gained weight after last visit. Stil says she is confused about dietary advice between what I have instructed and what RD at rehab has instructed her. She is poorly compliant with the low glycemic foods I have outlined  #ASthma  - well controlled  - continue current medication regimen  #WEight  - follow the diet advice I have outlined; eat only foods in the left lane  #Followup  3 months  OV 02/03/2013  Followup for asthma. She continues on Symbicort 2 puff 2 times a day without any change. She uses albuterol for rescue once every few days. There no nocturnal awakenings or chest tightness. She does continue to  have her chronic dyspnea on exertion for which she was attending pulmonary rehabilitation until she fell down due to benign positional vertigo and sustained a hematoma in her right thigharound end February 2014. She is now doing well and back to baseline. In terms of weight loss she has not followed the diet and has not lost any weight. She has looked into bariatric surgery but found it too expensive and her insurance will not cover it . 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 Since  then asthma overall stable without any admissions or exacerbations. She continues to have good days and bad days but albuterol rescue use is limited to 3 times a week at the most. She has worsening dyspnea when she exerts herself like lifting heavy objects but this could easily be from obesity. She does take albuterol preemptively in these situations. No nocturnal awakening or wheezing.  she has not had a flu shot but will have it today  Spirometry today shows FEV1 1.37 L or 62%. FVC 1.5 L/56%. Ratio of 89: Shows continued stability with moderate airways obstruction >>no changes   08/15/2013 Acute OV  Complains of increased SOB, chest tightness x5days , mild cough and sneezing. Having heartburn and indigestion, esp at night despite protonix daily . Seen by GI few days ago , set up for endoscopy for dysphagia. No fever, discolored mucus, orthopnea, PNA, leg swelling, calf pain . Has taken her Ventolin with relief. Symptoms are worse at night , feels something come into chest . No vomiting. No missed doses of symbicort.  Took Allegra yesterday for sneezing. Son has been sick w/ cold like symptoms.   >>increased symbicort 160, added pepcid and allegra   09/01/13 Follow up  Returns because she ran out of Symbicort this am.  Accompanied by her interpretor. Says she has done so much better over last 2 weeks with decreased dyspnea, wheezing on new dose of symbicort. Seen 2 weeks ago with asthma flare with suspected GERD and AR triggers.  Symbicort dose was increased and recommended to add allegra and pepcid .daily . Says she took her last symbicort 160 yesterday , noticed this am that her breathing was not as good. Wants rx for new symbicort. No chest pain , orthopnea, edema, fever, n/v, or calf pain.  Also off allegra over last week.    Review of Systems  Constitutional: Negative for fever and unexpected weight change.  HENT: Negative for ear pain, nosebleeds, sore throat, rhinorrhea, sneezing,  dental problem, sinus pressure.   Eyes: Negative for redness and itching.  Respiratory: Negative for cough, chest tightness Cardiovascular: Negative for palpitations and leg swelling.  Gastrointestinal: Negative for nausea and vomiting.  Genitourinary: Negative for dysuria.  Musculoskeletal: Negative for joint swelling.  Skin: Negative for rash.  Neurological: Negative for headaches.  Hematological: Does not bruise/bleed easily.  Psychiatric/Behavioral: Negative for dysphoric mood. The patient is not nervous/anxious.       Objective:   Physical Exam Vitals reviewed. Constitutional: She is oriented to person, place, and time. She appears well-developed and well-nourished. No distress.  Speaks through a deaf mute interpreter  HENT:  Head: Normocephalic and atraumatic.  Right Ear: External ear normal.  Left Ear: External ear normal.  Mouth/Throat: Oropharynx is clear and moist. No oropharyngeal exudate.  Eyes: Conjunctivae and EOM are normal. Pupils are equal, round, and reactive to light. Right eye exhibits no discharge. Left eye exhibits no discharge. No scleral icterus.  Neck: Normal  range of motion. Neck supple. No JVD present. No tracheal deviation present. No thyromegaly present.  Cardiovascular: Normal rate, regular rhythm, normal heart sounds and intact distal pulses.  Exam reveals no gallop and no friction rub.  Neg homans sign  No murmur heard. Pulmonary/Chest: Effort normal and breath sounds normal. No respiratory distress. She has no wheezes. She has no rales. She exhibits no tenderness.  Abdominal: Soft. Bowel sounds are normal. She exhibits no distension and no mass. There is no tenderness. There is no rebound and no guarding.  Musculoskeletal:  Normal range of motion. She exhibits no edema and no tenderness.  Lymphadenopathy:    She has no cervical adenopathy.  Neurological: She is alert and oriented to person, place, and time. She has normal reflexes. No cranial nerve deficit. She exhibits normal muscle tone. Coordination normal.  Skin: Skin is warm and dry. No rash noted. She is not diaphoretic. No erythema. No pallor.  Psychiatric: She has a normal mood and affect. Her behavior is normal. Judgment and thought content normal.   Vitals reviewed. Constitutional: She is oriented to person, place, and time. She appears well-developed and well-nourished. No distress.                Assessment & Plan:

## 2013-09-02 NOTE — Assessment & Plan Note (Signed)
Improved compensation on current regimen  Cont w/ trigger control with GERD /AR prevention   Plan  Restart Symbicort 160/4.59mcg 2 puffs Twice daily  Continue on Pepcid 20mg  At bedtime   GERD diet  Continue on Protonix daily  Restart Allegra 180mg  daily for 1 week then As needed  For drainage.  follow up Dr. Marchelle Gearing in 4 weeks and As needed   Please contact office for sooner follow up if symptoms do not improve or worsen or seek emergency care

## 2013-09-09 ENCOUNTER — Other Ambulatory Visit (INDEPENDENT_AMBULATORY_CARE_PROVIDER_SITE_OTHER): Payer: No Typology Code available for payment source

## 2013-09-09 ENCOUNTER — Telehealth: Payer: Self-pay | Admitting: *Deleted

## 2013-09-09 DIAGNOSIS — E876 Hypokalemia: Secondary | ICD-10-CM

## 2013-09-09 LAB — BASIC METABOLIC PANEL
Calcium: 8.8 mg/dL (ref 8.4–10.5)
GFR: 76.36 mL/min (ref >60.00)
Glucose, Bld: 161 mg/dL — ABNORMAL HIGH (ref 70–99)
Sodium: 138 mEq/L (ref 135–145)

## 2013-09-09 MED ORDER — POTASSIUM CHLORIDE 20 MEQ PO PACK: 20.0000 meq | PACK | Freq: Once | ORAL | Status: DC

## 2013-09-09 NOTE — Telephone Encounter (Signed)
09/09/2013  Pt came in this morning for her blood work.    She is having trouble taking potassium chloride SA (K-DUR,KLOR-CON) 20 MEQ tablet.  The pill is too big and she is having trouble swallowing.  She has tried to cut in half, and still having trouble.  Is there something else you can give in place of this?  Please advise.  bw

## 2013-09-09 NOTE — Telephone Encounter (Signed)
Med filled to dissolvable tablets.

## 2013-09-09 NOTE — Telephone Encounter (Signed)
Can switch to K+ syrup since she cant swallow pills

## 2013-09-09 NOTE — Telephone Encounter (Signed)
Please advise 

## 2013-09-15 ENCOUNTER — Ambulatory Visit
Admission: RE | Admit: 2013-09-15 | Discharge: 2013-09-15 | Disposition: A | Discharge status: Home / Self Care | Payer: No Typology Code available for payment source | Source: Ambulatory Visit | Attending: Family Medicine | Admitting: Family Medicine

## 2013-09-15 DIAGNOSIS — Z1231 Encounter for screening mammogram for malignant neoplasm of breast: Secondary | ICD-10-CM

## 2013-09-17 ENCOUNTER — Telehealth: Payer: Self-pay

## 2013-09-17 ENCOUNTER — Ambulatory Visit (AMBULATORY_SURGERY_CENTER): Payer: No Typology Code available for payment source | Admitting: Gastroenterology

## 2013-09-17 ENCOUNTER — Encounter: Payer: Self-pay | Admitting: Gastroenterology

## 2013-09-17 VITALS — BP 146/81 | HR 82 | Temp 97.6°F | Resp 18 | Ht 63.0 in | Wt 245.0 lb

## 2013-09-17 DIAGNOSIS — R1314 Dysphagia, pharyngoesophageal phase: Secondary | ICD-10-CM

## 2013-09-17 DIAGNOSIS — D126 Benign neoplasm of colon, unspecified: Secondary | ICD-10-CM

## 2013-09-17 DIAGNOSIS — Z1211 Encounter for screening for malignant neoplasm of colon: Secondary | ICD-10-CM

## 2013-09-17 MED ORDER — SODIUM CHLORIDE 0.9 % IV SOLN: 500.0000 mL | INTRAVENOUS | Status: DC

## 2013-09-17 NOTE — Progress Notes (Signed)
A/ox3 pleased with MAC, report to Tracy RN 

## 2013-09-17 NOTE — Op Note (Signed)
[facility] [address] [place], [address]   ENDOSCOPY PROCEDURE REPORT  PATIENT: Rhonda Morrow, Rhonda Morrow  MR#: [id number] BIRTHDATE: Sep 12, 1961 , 52  yrs. old GENDER: Female ENDOSCOPIST: Rachael Fee, MD REFERRED BY:  Neena Rhymes, M.D. PROCEDURE DATE:  09/17/2013 PROCEDURE:  EGD, diagnostic ASA CLASS:     Class II INDICATIONS:  Dysphagia. MEDICATIONS: Propofol (Diprivan) 140 mg IV TOPICAL ANESTHETIC: none  DESCRIPTION OF PROCEDURE: After the risks benefits and alternatives of the procedure were thoroughly explained, informed consent was obtained.  The LB [id number] endoscope was introduced through the mouth and advanced to the second portion of the duodenum. Without limitations.  The instrument was slowly withdrawn as the mucosa was fully examined.    The upper, middle and distal third of the esophagus were carefully inspected and no abnormalities were noted.  The z-line was well seen at the GEJ.  The endoscope was pushed into the fundus which was normal including a retroflexed view.  The antrum, gastric body, first and second part of the duodenum were unremarkable. Retroflexed views revealed no abnormalities.     The scope was then withdrawn from the patient and the procedure completed. COMPLICATIONS: There were no complications.  ENDOSCOPIC IMPRESSION: Normal EGD No strictures, stenosis in esophagus  RECOMMENDATIONS: Chew your food well, eat slowly and take small bites.  My office will get in touch with you about having an esophageal motility test done for your swallowing difficulty.   eSigned:  Rachael Fee, MD 09/17/2013 10:15 AM

## 2013-09-17 NOTE — Op Note (Signed)
[facility] [address] [place], [address]   COLONOSCOPY PROCEDURE REPORT  PATIENT: Rhonda Morrow, Rhonda Morrow  MR#: [id number] BIRTHDATE: 17-Jul-1961 , 52  yrs. old GENDER: Female ENDOSCOPIST: Rachael Fee, MD REFERRED NF:[id number] Beverely Low, M.D. PROCEDURE DATE:  09/17/2013 PROCEDURE:   Colonoscopy with biopsy First Screening Colonoscopy - Avg.  risk and is 50 yrs.  old or older Yes.  Prior Negative Screening - Now for repeat screening. N/A  History of Adenoma - Now for follow-up colonoscopy & has been > or = to 3 yrs.  N/A  Polyps Removed Today? Yes. ASA CLASS:   Class II INDICATIONS:average risk screening. MEDICATIONS: propofol (Diprivan) 200mg  IV and MAC sedation, administered by CRNA  DESCRIPTION OF PROCEDURE:   After the risks benefits and alternatives of the procedure were thoroughly explained, informed consent was obtained.  A digital rectal exam revealed no abnormalities of the rectum.   The LB [id number]  endoscope was introduced through the anus and advanced to the cecum, which was identified by both the appendix and ileocecal valve. No adverse events experienced.   The quality of the prep was excellent.  The instrument was then slowly withdrawn as the colon was fully examined.  COLON FINDINGS: One polyp was found, removed and sent to pathology. This was sessile, 2mm across, located in sigmoid segment, removed with biopsy forceps, sent to pathology.  The examination was otherwise normal.  Retroflexed views revealed no abnormalities. The time to cecum=2 minutes 36 seconds.  Withdrawal time=6 minutes 47 seconds.  The scope was withdrawn and the procedure completed. COMPLICATIONS: There were no complications.  ENDOSCOPIC IMPRESSION: One polyp was found, removed and sent to pathology. The examination was otherwise normal.  RECOMMENDATIONS: If the polyp(s) removed today are proven to be adenomatous (pre-cancerous) polyps, you will need a repeat  colonoscopy in 5 years.  Otherwise you should continue to follow colorectal cancer screening guidelines for "routine risk" patients with colonoscopy in 10 years.  You will receive a letter within 1-2 weeks with the results of your biopsy as well as final recommendations.  Please call my office if you have not received a letter after 3 weeks.   eSigned:  Rachael Fee, MD 09/17/2013 9:56 AM

## 2013-09-17 NOTE — Telephone Encounter (Signed)
My office  will get in touch with you about having an esophageal motility test  done for your swallowing difficulty.

## 2013-09-17 NOTE — Patient Instructions (Addendum)
Colonoscopy - polyps, handout given.  Endoscopy -  NORMAL   YOU HAD AN ENDOSCOPIC PROCEDURE TODAY AT THE [facility]: Refer to the procedure report that was given to you for any specific questions about what was found during the examination.  If the procedure report does not answer your questions, please call your gastroenterologist to clarify.  If you requested that your care partner not be given the details of your procedure findings, then the procedure report has been included in a sealed envelope for you to review at your convenience later.  YOU SHOULD EXPECT: Some feelings of bloating in the abdomen. Passage of more gas than usual.  Walking can help get rid of the air that was put into your GI tract during the procedure and reduce the bloating. If you had a lower endoscopy (such as a colonoscopy or flexible sigmoidoscopy) you may notice spotting of blood in your stool or on the toilet paper. If you underwent a bowel prep for your procedure, then you may not have a normal bowel movement for a few days.  DIET: Your first meal following the procedure should be a light meal and then it is ok to progress to your normal diet.  A half-sandwich or bowl of soup is an example of a good first meal.  Heavy or fried foods are harder to digest and may make you feel nauseous or bloated.  Likewise meals heavy in dairy and vegetables can cause extra gas to form and this can also increase the bloating.  Drink plenty of fluids but you should avoid alcoholic beverages for 24 hours.  ACTIVITY: Your care partner should take you home directly after the procedure.  You should plan to take it easy, moving slowly for the rest of the day.  You can resume normal activity the day after the procedure however you should NOT DRIVE or use heavy machinery for 24 hours (because of the sedation medicines used during the test).    SYMPTOMS TO REPORT IMMEDIATELY: A gastroenterologist can be reached at any hour.  During  normal business hours, 8:30 AM to 5:00 PM Monday through Friday, call [phone].  After hours and on weekends, please call the GI answering service at [phone] who will take a message and have the physician on call contact you.   Following lower endoscopy (colonoscopy or flexible sigmoidoscopy):  Excessive amounts of blood in the stool  Significant tenderness or worsening of abdominal pains  Swelling of the abdomen that is new, acute  Fever of 100F or higher  Following upper endoscopy (EGD)  Vomiting of blood or coffee ground material  New chest pain or pain under the shoulder blades  Painful or persistently difficult swallowing  New shortness of breath  Fever of 100F or higher  Black, tarry-looking stools  FOLLOW UP: If any biopsies were taken you will be contacted by phone or by letter within the next 1-3 weeks.  Call your gastroenterologist if you have not heard about the biopsies in 3 weeks.  Our staff will call the home number listed on your records the next business day following your procedure to check on you and address any questions or concerns that you may have at that time regarding the information given to you following your procedure. This is a courtesy call and so if there is no answer at the home number and we have not heard from you through the emergency physician on call, we will assume that you have returned to  your regular daily activities without incident.  SIGNATURES/CONFIDENTIALITY: You and/or your care partner have signed paperwork which will be entered into your electronic medical record.  These signatures attest to the fact that that the information above on your After Visit Summary has been reviewed and is understood.  Full responsibility of the confidentiality of this discharge information lies with you and/or your care-partner. 

## 2013-09-17 NOTE — Progress Notes (Signed)
Patient did not have preoperative order for IV antibiotic SSI prophylaxis. (G8918)  Patient did not experience any of the following events: a burn prior to discharge; a fall within the facility; wrong site/side/patient/procedure/implant event; or a hospital transfer or hospital admission upon discharge from the facility. (G8907)  

## 2013-09-17 NOTE — Progress Notes (Signed)
Called to room to assist during endoscopic procedure.  Patient ID and intended procedure confirmed with present staff. Received instructions for my participation in the procedure from the performing physician.  

## 2013-09-17 NOTE — Progress Notes (Signed)
Patient is deaf.  Deaf interpreter used in recovery - Jodene Nam along with family members.

## 2013-09-18 ENCOUNTER — Telehealth: Payer: Self-pay | Admitting: *Deleted

## 2013-09-18 ENCOUNTER — Ambulatory Visit (INDEPENDENT_AMBULATORY_CARE_PROVIDER_SITE_OTHER): Payer: No Typology Code available for payment source | Admitting: Internal Medicine

## 2013-09-18 ENCOUNTER — Encounter: Payer: Self-pay | Admitting: Internal Medicine

## 2013-09-18 VITALS — BP 142/88 | HR 65 | Temp 97.5°F | Ht 62.0 in | Wt 241.8 lb

## 2013-09-18 DIAGNOSIS — J45909 Unspecified asthma, uncomplicated: Secondary | ICD-10-CM

## 2013-09-18 DIAGNOSIS — J4540 Moderate persistent asthma, uncomplicated: Secondary | ICD-10-CM

## 2013-09-18 MED ORDER — ALBUTEROL SULFATE HFA 108 (90 BASE) MCG/ACT IN AERS: 2.0000 | INHALATION_SPRAY | Freq: Four times a day (QID) | RESPIRATORY_TRACT | Status: DC | PRN

## 2013-09-18 MED ORDER — BUDESONIDE-FORMOTEROL FUMARATE 160-4.5 MCG/ACT IN AERO: 2.0000 | INHALATION_SPRAY | Freq: Two times a day (BID) | RESPIRATORY_TRACT | Status: DC

## 2013-09-18 NOTE — Patient Instructions (Signed)
Asthma appears well controlled Continue Symbicort 160/4.81mcg 2 puffs Twice daily  You already had flu shot 2014-2015 season Continue on Pepcid 20mg  At bedtime   GERD diet  Continue on Protonix daily  Continue Allegra 180mg  daily for 1 week then As needed  For drainage.  follow up Dr. Marchelle Gearing in 6 months and As needed   Please contact office for sooner follow up if symptoms do not improve or worsen or seek emergency care

## 2013-09-18 NOTE — Telephone Encounter (Signed)
  Follow up Call-  Call back number 09/17/2013  Post procedure Call Back phone  # [phone]  Permission to leave phone message Yes     Patient questions:  Do you have a fever, pain , or abdominal swelling? no Pain Score  0 *  Have you tolerated food without any problems? yes  Have you been able to return to your normal activities? yes  Do you have any questions about your discharge instructions: Diet   no Medications  no Follow up visit  no  Do you have questions or concerns about your Care? no  Actions: * If pain score is 4 or above: No action needed, pain <4.interpreter phone service reached ,pt had no questions,denied fever,pain or problems

## 2013-09-18 NOTE — Assessment & Plan Note (Signed)
Asthma appears well controlled Continue Symbicort 160/4.5mcg 2 puffs Twice daily  You already had flu shot 2014-2015 season Continue on Pepcid 20mg At bedtime   GERD diet  Continue on Protonix daily  Continue Allegra 180mg daily for 1 week then As needed  For drainage.  follow up Dr. Rihana Kiddy in 6 months and As needed   Please contact office for sooner follow up if symptoms do not improve or worsen or seek emergency care   

## 2013-09-18 NOTE — Progress Notes (Signed)
Subjective:    Patient ID: Rhonda Morrow, female    DOB: [birth year], 52 y.o.   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 Here with interpreter.  reports that she has never smoked. She does not have any smokeless tobacco history on file. Body mass index is 42.64 kg/(m^2). Husband is patient of Dr Shelle Iron and has O2 dependency though is never smoker. She works in [employer]   Reports dyspnea. Especially with exertion walking or hurrying and walking and climbing steps. Stops half way through steps. Dyspnea also made worse by sitting in small cars. No dyspnea for eating or change of clothes. Relieved by rest and drinking water or sitting in large SUV/Van. Feels air hungry. INsidious onset. Slowly progressive. Says people can hear her dyspneic. Associated symptoms: 20# weight gain x 1 year,  occasional cough only during URI, wheezing, popping sounds occasionally in ear, hx of occasional URI and chest tightness. Says chest tightness improved by prevacid. Uses 3 pillows to sleep for years. Unclear if there is  paroxysmal nocturnal dyspnea or not. 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 Denies family hx of asthma. No pet birds. Has dog outside. No cats. No mold exposure but maybe in bathtub (not sure)  Walking desaturation test 185 feet x 3 laps"   REC Do not know why you are short of breath  Please have breathing test called PFT  BAsed on those test results I will call you and decide next step   OV 03/11/2012 Here to review test results. Interpreter is Rebecca [place]; patient deaf and mute  PFt date 01/24/12 shows mixed obstruction - restriction but greater restriction with low dlco   - fev1 1/48L/58%, Ratio 82, TLC 56,  DLCO 14/51%, No BD response  CT chest 02/09/12:  The ct chest does not show any scarring.     In terms of symptoms: current wet, cold, rainy weather bothering  her but only mildly - more sneezing. IN terms of dyspnea: still present especially with stairs, walking fast, being in a rush. Improved with rest and drinking water. Slowly getting worse. DEnies current cough. No other new problems. Also, feeling of fullness and dyspnea after eating. There is also some chest tightnes and hurt when yawnining; improves with water. Lot of questions on why she is dyspneic incluing if obesity is contributing.   Past, Family, Social reviewed: no change since last visit   REC  Please have methacholine challenge test for asthma  We will call you with results  If test positive, you will come in to start asthma treatment  IF test negative, will refer you to pulmonary rehab for weight related shortness of breath   OV 04/18/2012 Here to discuss results of methacholine challenge.No interim complaints. Interepreter is Lurena Joiner  positive methachole challenge test ofr 03/19/12  - good cooperation per RT. 24% Fev1 drop and 17% FVC drop at dose 0.25   #Shortness of breath  - this is due to asthma and weight  - for asthma Please start symbicort 80/4.5 2 puff twice daily - take sample, and show technique (script with refills done)  - I have referred you to pulmonary rehabilitation for shortness of breath exercises  #Weight management - I will discuss diet with you next visit  #Followup 6-8 weeks or sooner if needed    OV 06/24/2012 - followup dyspnea on  basis of asthma and obesity. Here with deaf mute intepreter.   - Reports improved dyspnea following starting symbicort > 90% compliance . Using albuterol for rescue only which she has used only occasionally; only once or two times per last week.  Feels she can sleep and rest better now but I am unable to sort out what is really better. Walking steps still a problem and in fact NOT improved. She is still though resorting to albuterol for rescue and drinking water. No associated weight loss. She has not attended rehab; says  Jamesetta So has not called back on phone calls.    - Obesity Body mass index is 42.43 kg/(m^2).: diet rviewed. Eating way too many high and medium glycemic foods. Eats very little low glycemic foods   #ASthma  - Continue symbicort 80/4.5 2 puff twice daily  #shortness of breath and obesity  - I have emailed Phyllis at rehab and will get back to you about starting there in rehabreferred you to pulmonary rehabilitation for shortness of breath exercises  #obesity  - follow [facility] low glycemic diet for weight loss  #Followup 4 weeks or sooner if needed   OV 08/05/2012 Followup asthma and obesity  Asthma: very well controlled on symbicort. Attending Rehab  OBesity: - weight 243# and Body mass index is 41.81 kg/(m^2). - down from BMI 42.43 last ov (down 10# since May 2013 and 4# since last OV). Weight loss is intentional. She is confused between my dietary advice and advice given at rehab. Therefore, she is eating lot of high glycemic foods like ripe bananas.        OV 10/02/2012  Estimated Body mass index is 42.69 kg/(m^2) as calculated from the following:   Height as of this encounter: 5\' 4" (1.626 m).   Weight as of this encounter: 248 lb 11.2 oz(112.81 kg).   ASthma - well controlled. No problems  Multifactrial dyspnea - resolved with rehab. Going to [employer]  Weight  - gained weight after last visit. Stil says she is confused about dietary advice between what I have instructed and what RD at rehab has instructed her. She is poorly compliant with the low glycemic foods I have outlined  #ASthma  - well controlled  - continue current medication regimen  #WEight  - follow the diet advice I have outlined; eat only foods in the left lane  #Followup  3 months  OV 02/03/2013  Followup for asthma. She continues on Symbicort 2 puff 2 times a day without any change. She uses albuterol for rescue once every few days. There no nocturnal awakenings or chest tightness. She does continue to  have her chronic dyspnea on exertion for which she was attending pulmonary rehabilitation until she fell down due to benign positional vertigo and sustained a hematoma in her right thigharound end February 2014. She is now doing well and back to baseline. In terms of weight loss she has not followed the diet and has not lost any weight. She has looked into bariatric surgery but found it too expensive and her insurance will not cover it . 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  Since then asthma overall stable without any admissions or exacerbations. She continues to have good days and bad days but albuterol rescue use is limited to 3 times a week at the most. She has worsening dyspnea when she exerts herself like lifting heavy objects but this could easily be from obesity. She does take albuterol preemptively in these situations. No nocturnal awakening or wheezing.  she has not had a flu shot but will have it today  Spirometry today shows FEV1 1.37 L or 62%. FVC 1.5 L/56%. Ratio of 89: Shows continued stability with moderate airways obstruction >>no changes   08/15/2013 Acute OV  Complains of increased SOB, chest tightness x5days , mild cough and sneezing. Having heartburn and indigestion, esp at night despite protonix daily . Seen by GI few days ago , set up for endoscopy for dysphagia. No fever, discolored mucus, orthopnea, PNA, leg swelling, calf pain . Has taken her Ventolin with relief. Symptoms are worse at night , feels something come into chest . No vomiting. No missed doses of symbicort.  Took Allegra yesterday for sneezing. Son has been sick w/ cold like symptoms.   >>increased symbicort 160, added pepcid and allegra   09/01/13 Follow up  Returns because she ran out of Symbicort this am.  Accompanied by her interpretor. Says she has done so much better over last 2 weeks with decreased dyspnea, wheezing on new dose of symbicort. Seen 2 weeks ago with asthma flare with suspected GERD and AR triggers.  Symbicort dose was increased and recommended to add allegra and pepcid .daily . Says she took her last symbicort 160 yesterday , noticed this am that her breathing was not as good. Wants rx for new symbicort. No chest pain , orthopnea, edema, fever, n/v, or calf pain.  Also off allegra over last week.    RED Restart Symbicort 160/4.70mcg 2 puffs Twice daily  Continue on Pepcid 20mg  At bedtime   GERD diet  Continue on Protonix daily  Restart Allegra 180mg  daily for 1 week then As needed  For drainage.  follow up Dr. Marchelle Gearing in 4 weeks and As needed   Please contact office for sooner follow up if symptoms do not improve or worsen or seek emergency care    OV 09/18/2013  Followup for moderate persistent asthma. Accompanied by her interpreter. She was seen by my nurse practitioner Rikki Spearing just under a month ago. Her review those notes and noted the changes. Currently this is a followup visit in response to that visit. Patient is doing well. Asthma is extremely well controlled. Albuterol use is less than 2 times per week. No nocturnal awakenings. No wheeze. Compliant with her medications. She feels she is doing much better on the higher dose Symbicort and on the Pepcid. She believes she did not have the flu shot this season but our records indicate that she already had  Past medical history reviewed no changes noted other than as above Review of Systems  Constitutional: Negative for fever and unexpected weight change.  HENT: Negative for congestion, dental problem, ear pain, nosebleeds, postnasal drip, rhinorrhea, sinus pressure, sneezing, sore throat  and trouble swallowing.   Eyes: Negative for redness and itching.  Respiratory: Negative for cough, chest tightness, shortness of breath and wheezing.   Cardiovascular: Negative for palpitations and leg swelling.  Gastrointestinal: Negative for nausea and vomiting.  Genitourinary: Negative for dysuria.  Musculoskeletal: Negative for joint swelling.  Skin: Negative for rash.  Neurological: Negative for headaches.  Hematological: Does  not bruise/bleed easily.  Psychiatric/Behavioral: Negative for dysphoric mood. The patient is not nervous/anxious.        Objective:   Physical Exam  Vitals reviewed. Constitutional: She is oriented to person, place, and time. She appears well-developed and well-nourished. No distress.  Body mass index is 44.21 kg/(m^2).   HENT:  Head: Normocephalic and atraumatic.  Right Ear: External ear normal.  Left Ear: External ear normal.  Mouth/Throat: Oropharynx is clear and moist. No oropharyngeal exudate.  No thrush  Eyes: Conjunctivae and EOM are normal. Pupils are equal, round, and reactive to light. Right eye exhibits no discharge. Left eye exhibits no discharge. No scleral icterus.  Neck: Normal range of motion. Neck supple. No JVD present. No tracheal deviation present. No thyromegaly present.  Cardiovascular: Normal rate, regular rhythm, normal heart sounds and intact distal pulses.  Exam reveals no gallop and no friction rub.   No murmur heard. Pulmonary/Chest: Effort normal and breath sounds normal. No respiratory distress. She has no wheezes. She has no rales. She exhibits no tenderness.  Abdominal: Soft. Bowel sounds are normal. She exhibits no distension and no mass. There is no tenderness. There is no rebound and no guarding.  Musculoskeletal: Normal range of motion. She exhibits no edema and no tenderness.  Lymphadenopathy:    She has no cervical adenopathy.  Neurological: She is alert and oriented to person, place, and time. She has normal  reflexes. No cranial nerve deficit. She exhibits normal muscle tone. Coordination normal.  Skin: Skin is warm and dry. No rash noted. She is not diaphoretic. No erythema. No pallor.  Psychiatric: She has a normal mood and affect. Her behavior is normal. Judgment and thought content normal.          Assessment & Plan:

## 2013-09-18 NOTE — Telephone Encounter (Signed)
You have been scheduled for an esophageal manometry at [facility] on 09/29/13 at 8 am. Please arrive 30 minutes prior to your procedure for registration. You will need to go to outpatient registration (1st floor of the hospital) first. Make certain to bring your insurance cards as well as a complete list of medications.  Please remember the following:  1) Nothing to eat or drink after 12:00 midnight on the night before your test.  2) Hold all diabetic medications/insulin the morning of the test. You may eat and take your medications after the test.  3) For 3 days prior to your test do not take: Dexilant, Prevacid, Nexium, Protonix, Aciphex, Zegerid, Pantoprazole, Prilosec or omeprazole.  4) For 2 days prior to your test, do not take: Reglan, Tagamet, Zantac, Axid or Pepcid.  5) You MAY use an antacid such as Rolaids or Tums up to 12 hours prior to your test.  It will take at least 2 weeks to receive the results of this test from your physician. ------------------------------------------ ABOUT ESOPHAGEAL MANOMETRY Esophageal manometry (muh-NOM-uh-tree) is a test that gauges how well your esophagus works. Your esophagus is the long, muscular tube that connects your throat to your stomach. Esophageal manometry measures the rhythmic muscle contractions (peristalsis) that occur in your esophagus when you swallow. Esophageal manometry also measures the coordination and force exerted by the muscles of your esophagus.  During esophageal manometry, a thin, flexible tube (catheter) that contains sensors is passed through your nose, down your esophagus and into your stomach. Esophageal manometry can be helpful in diagnosing some mostly uncommon disorders that affect your esophagus.  Why it's done Esophageal manometry is used to evaluate the movement (motility) of food through the esophagus and into the stomach. The test measures how well the circular bands of muscle (sphincters) at the top and  bottom of your esophagus open and close, as well as the pressure, strength and pattern of the wave of esophageal muscle contractions that moves food along.  What you can expect Esophageal manometry is an outpatient procedure done without sedation. Most people tolerate it well. You may be asked to change into a hospital gown before the test starts.  During esophageal manometry  While you are sitting up, a member of your health care team sprays your throat with a numbing medication or puts numbing gel in your nose or both.  A catheter is guided through your nose into your esophagus. The catheter may be sheathed in a water-filled sleeve. It doesn't interfere with your breathing. However, your eyes may water, and you may gag. You may have a slight nosebleed from irritation.  After the catheter is in place, you may be asked to lie on your back on an exam table, or you may be asked to remain seated.  You then swallow small sips of water. As you do, a computer connected to the catheter records the pressure, strength and pattern of your esophageal muscle contractions.  During the test, you'll be asked to breathe slowly and smoothly, remain as still as possible, and swallow only when you're asked to do so.  A member of your health care team may move the catheter down into your stomach while the catheter continues its measurements.  The catheter then is slowly withdrawn. The test usually lasts 20 to 30 minutes.  After esophageal manometry  When your esophageal manometry is complete, you may return to your normal activities  This test typically takes 30-45 minutes to complete. ________________________________________________________________________________ Pt has been  notified and instructed also mailed to the pt home

## 2013-09-22 ENCOUNTER — Telehealth: Payer: Self-pay | Admitting: *Deleted

## 2013-09-22 ENCOUNTER — Encounter: Payer: Self-pay | Admitting: Gastroenterology

## 2013-09-22 DIAGNOSIS — K219 Gastro-esophageal reflux disease without esophagitis: Secondary | ICD-10-CM

## 2013-09-22 MED ORDER — PANTOPRAZOLE SODIUM 40 MG PO TBEC: 40.0000 mg | DELAYED_RELEASE_TABLET | Freq: Every day | ORAL | Status: DC

## 2013-09-22 NOTE — Telephone Encounter (Signed)
Med filled.  

## 2013-09-22 NOTE — Telephone Encounter (Signed)
09/22/2013  Pt came with husband for appt this morning.  She needs refill on pantoprazole (PROTONIX) 40 MG tablet.  She is completely out.    Please send to [facility] [address]  Thank you.  bw

## 2013-09-24 ENCOUNTER — Encounter: Payer: Self-pay | Admitting: Gastroenterology

## 2013-09-29 ENCOUNTER — Encounter (HOSPITAL_COMMUNITY)
Admission: RE | Disposition: A | Discharge status: Home / Self Care | Payer: Self-pay | Source: Ambulatory Visit | Attending: Gastroenterology

## 2013-09-29 ENCOUNTER — Ambulatory Visit (HOSPITAL_COMMUNITY)
Admission: RE | Admit: 2013-09-29 | Discharge: 2013-09-29 | Disposition: A | Discharge status: Home / Self Care | Payer: No Typology Code available for payment source | Source: Ambulatory Visit | Attending: Gastroenterology | Admitting: Gastroenterology

## 2013-09-29 DIAGNOSIS — K219 Gastro-esophageal reflux disease without esophagitis: Secondary | ICD-10-CM

## 2013-09-29 DIAGNOSIS — R1310 Dysphagia, unspecified: Secondary | ICD-10-CM | POA: Insufficient documentation

## 2013-09-29 DIAGNOSIS — R1314 Dysphagia, pharyngoesophageal phase: Secondary | ICD-10-CM

## 2013-09-29 HISTORY — PX: ESOPHAGEAL MANOMETRY: SHX5429

## 2013-09-29 SURGERY — MANOMETRY, ESOPHAGUS
Anesthesia: Topical

## 2013-09-29 MED ORDER — LIDOCAINE VISCOUS 2 % MT SOLN
OROMUCOSAL | Status: AC
  Filled 2013-09-29: qty 15

## 2013-09-30 ENCOUNTER — Encounter (HOSPITAL_COMMUNITY): Payer: Self-pay | Admitting: Gastroenterology

## 2013-10-16 ENCOUNTER — Encounter: Payer: Self-pay | Admitting: Family Medicine

## 2013-10-17 ENCOUNTER — Other Ambulatory Visit: Payer: Self-pay | Admitting: General Practice

## 2013-10-17 DIAGNOSIS — K219 Gastro-esophageal reflux disease without esophagitis: Secondary | ICD-10-CM

## 2013-10-17 MED ORDER — PANTOPRAZOLE SODIUM 40 MG PO TBEC: 40.0000 mg | DELAYED_RELEASE_TABLET | Freq: Every day | ORAL | Status: DC

## 2013-11-12 ENCOUNTER — Encounter: Payer: Self-pay | Admitting: Family Medicine

## 2013-11-12 ENCOUNTER — Ambulatory Visit (INDEPENDENT_AMBULATORY_CARE_PROVIDER_SITE_OTHER): Payer: No Typology Code available for payment source | Admitting: Family Medicine

## 2013-11-12 VITALS — BP 124/84 | HR 94 | Temp 97.7°F | Resp 16 | Wt 250.2 lb

## 2013-11-12 DIAGNOSIS — M549 Dorsalgia, unspecified: Secondary | ICD-10-CM

## 2013-11-12 DIAGNOSIS — M67919 Unspecified disorder of synovium and tendon, unspecified shoulder: Secondary | ICD-10-CM

## 2013-11-12 DIAGNOSIS — M67912 Unspecified disorder of synovium and tendon, left shoulder: Secondary | ICD-10-CM

## 2013-11-12 DIAGNOSIS — M719 Bursopathy, unspecified: Secondary | ICD-10-CM

## 2013-11-12 MED ORDER — TRAMADOL HCL 50 MG PO TABS: 50.0000 mg | ORAL_TABLET | Freq: Three times a day (TID) | ORAL | Status: DC | PRN

## 2013-11-12 MED ORDER — CYCLOBENZAPRINE HCL 10 MG PO TABS: 10.0000 mg | ORAL_TABLET | Freq: Three times a day (TID) | ORAL | Status: DC | PRN

## 2013-11-12 NOTE — Progress Notes (Signed)
   Subjective:    Patient ID: Rhonda Morrow, female    DOB: Dec 09, 1960, 53 y.o.   MRN: [id number]  HPI L shoulder pain- limited movement x1 month.  Unable to lift arm higher than shoulder level.  [place]  Has hx of back pain, intermittent, described as a throb, worse w/ bending, radiating across shoulder blades.  Some radiating pain down L arm.   Review of Systems For ROS see HPI     Objective:   Physical Exam  Vitals reviewed. Constitutional: She is oriented to person, place, and time. She appears well-developed and well-nourished. No distress.  Musculoskeletal: She exhibits tenderness (paraspinal muscle spasm).  L shoulder w/ extremely limited mobility- unable to abduct arm higher than shoulder level, unable to forward flex arm higher than shoulder.  + impingement signs.  Neurological: She is alert and oriented to person, place, and time. She has normal reflexes.          Assessment & Plan:

## 2013-11-12 NOTE — Patient Instructions (Signed)
Follow up as needed Start taking the Tramadol for the back and shoulder pain Use the Flexeril at night- will cause drowsiness We'll call you with your ortho appt Try and avoid overhead lifting/activity Call with any questions or concerns Hang in there!

## 2013-11-12 NOTE — Progress Notes (Signed)
Pre visit review using our clinic review tool, if applicable. No additional management support is needed unless otherwise documented below in the visit note. 

## 2013-11-14 NOTE — Assessment & Plan Note (Addendum)
New.  Pt w/ extremely limited mobility and pain suggestive of cuff tear.  Has pain meds available.  Refer to ortho for complete evaluation and tx

## 2013-11-14 NOTE — Assessment & Plan Note (Signed)
Recurrent problem.  Flexeril and tramadol as needed

## 2013-11-19 ENCOUNTER — Telehealth: Payer: Self-pay | Admitting: *Deleted

## 2013-11-19 NOTE — Telephone Encounter (Addendum)
Received a fax from pt pharmacy for PA for symbicort. PA# is [phone].  Pt has to try and fail advair and dulera before medication can be approved. Please advise if either is an acceptable alternative for the patient?  Bing, CMA Allergies  Allergen Reactions  . Augmentin [Amoxicillin-Pot Clavulanate] Diarrhea  . Oxycodone-Acetaminophen Nausea And Vomiting

## 2013-11-19 NOTE — Telephone Encounter (Signed)
This is fine to do symbicort

## 2013-11-20 NOTE — Telephone Encounter (Signed)
I called and initiated PA for symbicort. We will received notice in 24-72 hrs regarding approval/denial. Case # [id number]

## 2013-11-24 ENCOUNTER — Telehealth: Payer: Self-pay | Admitting: Pulmonary Disease

## 2013-11-24 MED ORDER — BUDESONIDE-FORMOTEROL FUMARATE 160-4.5 MCG/ACT IN AERO: 2.0000 | INHALATION_SPRAY | Freq: Two times a day (BID) | RESPIRATORY_TRACT | Status: DC

## 2013-11-24 NOTE — Telephone Encounter (Signed)
Rx has been sent in. Pt is aware. 

## 2013-11-28 NOTE — Telephone Encounter (Signed)
Received a denial from insurance because the pt has to try and fail advair and dulera before Symbicort will be approved. Please advise which will be a good alternative for the pt? Tom Green Bing, CMA

## 2013-11-28 NOTE — Telephone Encounter (Signed)
Try dulera 100/4.5 2 puf bid  Dr. Brand Males, M.D., [facility].C.P Pulmonary and Critical Care Medicine Staff Physician [place] [facility] Pager: [phone], If no answer or between  15:00h - 7:00h: call [phone]  11/28/2013 5:43 PM

## 2013-12-01 MED ORDER — MOMETASONE FURO-FORMOTEROL FUM 100-5 MCG/ACT IN AERO: 2.0000 | INHALATION_SPRAY | Freq: Two times a day (BID) | RESPIRATORY_TRACT | Status: DC

## 2013-12-01 NOTE — Telephone Encounter (Signed)
Spoke with pt's translator and notified her of med change  Rx was sent to [facility]

## 2013-12-20 ENCOUNTER — Other Ambulatory Visit: Payer: Self-pay | Admitting: Family Medicine

## 2013-12-20 NOTE — Telephone Encounter (Signed)
Med filled.  

## 2014-01-07 ENCOUNTER — Ambulatory Visit (INDEPENDENT_AMBULATORY_CARE_PROVIDER_SITE_OTHER): Payer: No Typology Code available for payment source | Admitting: Nurse Practitioner

## 2014-01-07 ENCOUNTER — Encounter: Payer: Self-pay | Admitting: Nurse Practitioner

## 2014-01-07 ENCOUNTER — Encounter: Payer: Self-pay | Admitting: *Deleted

## 2014-01-07 VITALS — BP 155/82 | HR 100 | Temp 98.0°F | Ht 64.0 in | Wt 244.6 lb

## 2014-01-07 DIAGNOSIS — E010 Iodine-deficiency related diffuse (endemic) goiter: Secondary | ICD-10-CM

## 2014-01-07 DIAGNOSIS — E049 Nontoxic goiter, unspecified: Secondary | ICD-10-CM

## 2014-01-07 DIAGNOSIS — J069 Acute upper respiratory infection, unspecified: Secondary | ICD-10-CM

## 2014-01-07 NOTE — Patient Instructions (Signed)
You have a viral illness. The average duration is 14 days. Start daily sinus rinses (neilmed Sinus Rinse). Sip fluids every hour. Use inhaler if chest feels tight. Rest. If you are not feeling better in 1 week or develop fever or chest pain, call us for re-evaluation. Feel better!  Upper Respiratory Infection, Adult An upper respiratory infection (URI) is also sometimes known as the common cold. The upper respiratory tract includes the nose, sinuses, throat, trachea, and bronchi. Bronchi are the airways leading to the lungs. Most people improve within 1 week, but symptoms can last up to 2 weeks. A residual cough may last even longer.  CAUSES Many different viruses can infect the tissues lining the upper respiratory tract. The tissues become irritated and inflamed and often become very moist. Mucus production is also common. A cold is contagious. You can easily spread the virus to others by oral contact. This includes kissing, sharing a glass, coughing, or sneezing. Touching your mouth or nose and then touching a surface, which is then touched by another person, can also spread the virus. SYMPTOMS  Symptoms typically develop 1 to 3 days after you come in contact with a cold virus. Symptoms vary from person to person. They may include:  Runny nose.  Sneezing.  Nasal congestion.  Sinus irritation.  Sore throat.  Loss of voice (laryngitis).  Cough.  Fatigue.  Muscle aches.  Loss of appetite.  Headache.  Low-grade fever. DIAGNOSIS  You might diagnose your own cold based on familiar symptoms, since most people get a cold 2 to 3 times a year. Your caregiver can confirm this based on your exam. Most importantly, your caregiver can check that your symptoms are not due to another disease such as strep throat, sinusitis, pneumonia, asthma, or epiglottitis. Blood tests, throat tests, and X-rays are not necessary to diagnose a common cold, but they may sometimes be helpful in excluding other more  serious diseases. Your caregiver will decide if any further tests are required. RISKS AND COMPLICATIONS  You may be at risk for a more severe case of the common cold if you smoke cigarettes, have chronic heart disease (such as heart failure) or lung disease (such as asthma), or if you have a weakened immune system. The very young and very old are also at risk for more serious infections. Bacterial sinusitis, middle ear infections, and bacterial pneumonia can complicate the common cold. The common cold can worsen asthma and chronic obstructive pulmonary disease (COPD). Sometimes, these complications can require emergency medical care and may be life-threatening. PREVENTION  The best way to protect against getting a cold is to practice good hygiene. Avoid oral or hand contact with people with cold symptoms. Wash your hands often if contact occurs. There is no clear evidence that vitamin C, vitamin E, echinacea, or exercise reduces the chance of developing a cold. However, it is always recommended to get plenty of rest and practice good nutrition. TREATMENT  Treatment is directed at relieving symptoms. There is no cure. Antibiotics are not effective, because the infection is caused by a virus, not by bacteria. Treatment may include:  Increased fluid intake. Sports drinks offer valuable electrolytes, sugars, and fluids.  Breathing heated mist or steam (vaporizer or shower).  Eating chicken soup or other clear broths, and maintaining good nutrition.  Getting plenty of rest.  Using gargles or lozenges for comfort.  Controlling fevers with ibuprofen or acetaminophen as directed by your caregiver.  Increasing usage of your inhaler if you have asthma.  Zinc gel and zinc lozenges, taken in the first 24 hours of the common cold, can shorten the duration and lessen the severity of symptoms. Pain medicines may help with fever, muscle aches, and throat pain. A variety of non-prescription medicines are  available to treat congestion and runny nose. Your caregiver can make recommendations and may suggest nasal or lung inhalers for other symptoms.  HOME CARE INSTRUCTIONS   Only take over-the-counter or prescription medicines for pain, discomfort, or fever as directed by your caregiver.  Use a warm mist humidifier or inhale steam from a shower to increase air moisture. This may keep secretions moist and make it easier to breathe.  Drink enough water and fluids to keep your urine clear or pale yellow.  Rest as needed.  Return to work when your temperature has returned to normal or as your caregiver advises. You may need to stay home longer to avoid infecting others. You can also use a face mask and careful hand washing to prevent spread of the virus. SEEK MEDICAL CARE IF:   After the first few days, you feel you are getting worse rather than better.  You need your caregiver's advice about medicines to control symptoms.  You develop chills, worsening shortness of breath, or Balin or red sputum. These may be signs of pneumonia.  You develop yellow or Haynie nasal discharge or pain in the face, especially when you bend forward. These may be signs of sinusitis.  You develop a fever, swollen neck glands, pain with swallowing, or white areas in the back of your throat. These may be signs of strep throat. SEEK IMMEDIATE MEDICAL CARE IF:   You have a fever.  You develop severe or persistent headache, ear pain, sinus pain, or chest pain.  You develop wheezing, a prolonged cough, cough up blood, or have a change in your usual mucus (if you have chronic lung disease).  You develop sore muscles or a stiff neck. Document Released: 04/18/2001 Document Revised: 01/15/2012 Document Reviewed: 02/24/2011 [employer] Patient Information 2014 [employer]. 

## 2014-01-07 NOTE — Progress Notes (Signed)
   Subjective:    Patient ID: Rhonda Morrow, female    DOB: 01-05-1961, 53 y.o.   MRN: [id number]  URI  This is a new problem. The current episode started in the past 7 days. The problem has been unchanged. There has been no fever. Associated symptoms include congestion, coughing, headaches and a plugged ear sensation. Pertinent negatives include no abdominal pain, chest pain, diarrhea, ear pain, nausea, sinus pain, sore throat, swollen glands, vomiting or wheezing. Associated symptoms comments: Chills & fatigue. Treatments tried: mucinex flu. The treatment provided mild relief.      Review of Systems  Constitutional: Positive for chills and appetite change (decreased). Negative for activity change.  HENT: Positive for congestion. Negative for ear pain and sore throat.   Respiratory: Positive for cough. Negative for chest tightness, shortness of breath and wheezing.   Cardiovascular: Negative for chest pain.  Gastrointestinal: Negative for nausea, vomiting, abdominal pain and diarrhea.  Musculoskeletal: Negative for back pain.  Neurological: Positive for headaches.       Objective:   Physical Exam  Vitals reviewed. Constitutional: She is oriented to person, place, and time. She appears well-developed and well-nourished. No distress.  HENT:  Head: Normocephalic and atraumatic.  Right Ear: External ear normal.  Left Ear: External ear normal.  Mouth/Throat: Oropharynx is clear and moist. No oropharyngeal exudate.  Unable to visualize TM due to ceruminosis  Eyes: Conjunctivae are normal. Right eye exhibits no discharge. Left eye exhibits no discharge.  Neck: Normal range of motion. Neck supple. Thyromegaly present.  Cardiovascular: Normal rate, regular rhythm and normal heart sounds.   No murmur heard. Pulmonary/Chest: Effort normal and breath sounds normal. No respiratory distress. She has no wheezes. She has no rales.  Lymphadenopathy:    Cervical adenopathy: Anterior cervical LAD,  bilat, tender.  Neurological: She is alert and oriented to person, place, and time.  Skin: Skin is warm and dry.  Psychiatric: She has a normal mood and affect. Her behavior is normal. Thought content normal.          Assessment & Plan:

## 2014-01-07 NOTE — Progress Notes (Signed)
Pre visit review using our clinic review tool, if applicable. No additional management support is needed unless otherwise documented below in the visit note. 

## 2014-02-16 ENCOUNTER — Other Ambulatory Visit: Payer: Self-pay | Admitting: General Practice

## 2014-02-16 ENCOUNTER — Ambulatory Visit: Payer: No Typology Code available for payment source | Admitting: Family Medicine

## 2014-02-16 ENCOUNTER — Encounter: Payer: Self-pay | Admitting: Family Medicine

## 2014-02-16 ENCOUNTER — Ambulatory Visit (INDEPENDENT_AMBULATORY_CARE_PROVIDER_SITE_OTHER): Payer: No Typology Code available for payment source | Admitting: Family Medicine

## 2014-02-16 VITALS — BP 142/78 | HR 93 | Temp 98.4°F | Resp 16 | Wt 247.4 lb

## 2014-02-16 DIAGNOSIS — I10 Essential (primary) hypertension: Secondary | ICD-10-CM

## 2014-02-16 DIAGNOSIS — K219 Gastro-esophageal reflux disease without esophagitis: Secondary | ICD-10-CM

## 2014-02-16 DIAGNOSIS — R7309 Other abnormal glucose: Secondary | ICD-10-CM

## 2014-02-16 DIAGNOSIS — R609 Edema, unspecified: Secondary | ICD-10-CM

## 2014-02-16 LAB — BASIC METABOLIC PANEL
BUN: 10 mg/dL (ref 6–23)
CHLORIDE: 102 meq/L (ref 96–112)
CO2: 30 mEq/L (ref 19–32)
Calcium: 8.7 mg/dL (ref 8.4–10.5)
Creatinine, Ser: 0.9 mg/dL (ref 0.4–1.2)
GFR: 86.19 mL/min (ref >60.00)
Glucose, Bld: 130 mg/dL — ABNORMAL HIGH (ref 70–99)
POTASSIUM: 3.1 meq/L — AB (ref 3.5–5.1)
Sodium: 141 mEq/L (ref 135–145)

## 2014-02-16 LAB — CBC WITH DIFFERENTIAL/PLATELET
BASOS ABS: 0 10*3/uL (ref 0.0–0.1)
BASOS PCT: 0.3 % (ref 0.0–3.0)
Eosinophils Absolute: 0.1 10*3/uL (ref 0.0–0.7)
Eosinophils Relative: 1.4 % (ref 0.0–5.0)
HCT: 41.3 % (ref 36.0–46.0)
Hemoglobin: 13.4 g/dL (ref 12.0–15.0)
LYMPHS ABS: 1.6 10*3/uL (ref 0.7–4.0)
Lymphocytes Relative: 21.7 % (ref 12.0–46.0)
MCHC: 32.6 g/dL (ref 30.0–36.0)
MCV: 83.7 fl (ref 78.0–100.0)
Monocytes Absolute: 0.5 10*3/uL (ref 0.1–1.0)
Monocytes Relative: 6.4 % (ref 3.0–12.0)
NEUTROS ABS: 5.3 10*3/uL (ref 1.4–7.7)
Neutrophils Relative %: 70.2 % (ref 43.0–77.0)
Platelets: 218 10*3/uL (ref 150.0–400.0)
RBC: 4.93 Mil/uL (ref 3.87–5.11)
RDW: 15 % — AB (ref 11.5–14.6)
WBC: 7.5 10*3/uL (ref 4.5–10.5)

## 2014-02-16 LAB — HEMOGLOBIN A1C: HEMOGLOBIN A1C: 6.7 % — AB (ref 4.6–6.5)

## 2014-02-16 LAB — HEPATIC FUNCTION PANEL
ALK PHOS: 65 U/L (ref 39–117)
ALT: 24 U/L (ref 0–35)
AST: 21 U/L (ref 0–37)
Albumin: 3.3 g/dL — ABNORMAL LOW (ref 3.5–5.2)
BILIRUBIN DIRECT: 0 mg/dL (ref 0.0–0.3)
BILIRUBIN TOTAL: 0.3 mg/dL (ref 0.3–1.2)
Total Protein: 6.9 g/dL (ref 6.0–8.3)

## 2014-02-16 LAB — TSH: TSH: 0.87 u[IU]/mL (ref 0.35–5.50)

## 2014-02-16 MED ORDER — PANTOPRAZOLE SODIUM 40 MG PO TBEC: 40.0000 mg | DELAYED_RELEASE_TABLET | Freq: Every day | ORAL | Status: DC

## 2014-02-16 MED ORDER — HYDROCHLOROTHIAZIDE 12.5 MG PO TABS: 12.5000 mg | ORAL_TABLET | Freq: Every day | ORAL | Status: DC

## 2014-02-16 MED ORDER — TRAMADOL HCL 50 MG PO TABS: 50.0000 mg | ORAL_TABLET | Freq: Three times a day (TID) | ORAL | Status: DC | PRN

## 2014-02-16 MED ORDER — POTASSIUM CHLORIDE CRYS ER 20 MEQ PO TBCR: EXTENDED_RELEASE_TABLET | ORAL | Status: DC

## 2014-02-16 NOTE — Assessment & Plan Note (Signed)
Chronic problem.  BP now elevated and pt swelling.  Start HCTZ.  Check labs.  Will follow closely.

## 2014-02-16 NOTE — Assessment & Plan Note (Signed)
Chronic problem.  Pt due for A1C to determine if she has progressed to diabetes.  Again discussed need for improved diet and regular exercise.  Will follow.

## 2014-02-16 NOTE — Patient Instructions (Signed)
Follow up in 2-3 weeks to recheck BP and swelling CANCEL tomorrow's appt We'll notify you of your lab results and make any changes if needed Start the HCTZ daily in the morning Limit your salt intake- eating out, soups, processed meats (deli meats, hotdogs, sausage, pork) Increase your water intake Call with any questions or concerns Hang in there!!

## 2014-02-16 NOTE — Progress Notes (Signed)
   Subjective:    Patient ID: Rhonda Morrow, female    DOB: 1960/12/19, 53 y.o.   MRN: [id number]  HPI Edema- R leg and ankle swelling, painful, tight.  Some burning and itching.  No redness.  sxs started 2 weeks ago.  Has been eating out.  Some swelling and mild pain on L leg as well.  Pain w/ walking- particularly up stairs.    Prediabetes- noted on labs done in Oct.  Not currently on meds.  Due for labs.  HTN- chronic problem, mildly elevated today.  Pt reports she has been eating out regularly since refrigerator broke 3 weeks ago.   Review of Systems For ROS see HPI     Objective:   Physical Exam  Vitals reviewed. Constitutional: She is oriented to person, place, and time. She appears well-developed and well-nourished. No distress.  HENT:  Head: Normocephalic and atraumatic.  Eyes: Conjunctivae and EOM are normal. Pupils are equal, round, and reactive to light.  Neck: Normal range of motion. Neck supple. Thyromegaly present.  Cardiovascular: Normal rate, regular rhythm, normal heart sounds and intact distal pulses.   No murmur heard. Pulmonary/Chest: Effort normal and breath sounds normal. No respiratory distress.  Abdominal: Soft. She exhibits no distension. There is no tenderness.  Musculoskeletal: She exhibits edema (1+ pitting edema of legs bilaterally).  Lymphadenopathy:    She has no cervical adenopathy.  Neurological: She is alert and oriented to person, place, and time.  Skin: Skin is warm and dry.  Psychiatric: She has a normal mood and affect. Her behavior is normal.          Assessment & Plan:

## 2014-02-16 NOTE — Progress Notes (Signed)
Pre visit review using our clinic review tool, if applicable. No additional management support is needed unless otherwise documented below in the visit note. 

## 2014-02-16 NOTE — Assessment & Plan Note (Signed)
New.  Suspect this is multifactorial- high salt diet (pt ate sausage yesterday), limited activity, warmer temperatures.  No SOB.  sxs are bilateral which would make DVT unlikely.  Start HCTZ and monitor for improvement.  Reviewed lifestyle modifications.  Will follow.

## 2014-02-17 ENCOUNTER — Encounter: Payer: Self-pay | Admitting: General Practice

## 2014-02-17 ENCOUNTER — Ambulatory Visit: Payer: No Typology Code available for payment source | Admitting: Family Medicine

## 2014-02-17 MED ORDER — METFORMIN HCL 500 MG PO TABS: 500.0000 mg | ORAL_TABLET | Freq: Two times a day (BID) | ORAL | Status: DC

## 2014-02-19 ENCOUNTER — Encounter: Payer: Self-pay | Admitting: *Deleted

## 2014-02-19 ENCOUNTER — Telehealth: Payer: Self-pay

## 2014-02-19 ENCOUNTER — Encounter: Payer: Self-pay | Admitting: Nurse Practitioner

## 2014-02-19 ENCOUNTER — Ambulatory Visit (INDEPENDENT_AMBULATORY_CARE_PROVIDER_SITE_OTHER): Payer: No Typology Code available for payment source | Admitting: Nurse Practitioner

## 2014-02-19 VITALS — BP 169/85 | HR 97 | Temp 97.6°F | Ht 64.0 in | Wt 245.0 lb

## 2014-02-19 DIAGNOSIS — R112 Nausea with vomiting, unspecified: Secondary | ICD-10-CM | POA: Insufficient documentation

## 2014-02-19 DIAGNOSIS — E876 Hypokalemia: Secondary | ICD-10-CM | POA: Insufficient documentation

## 2014-02-19 DIAGNOSIS — E119 Type 2 diabetes mellitus without complications: Secondary | ICD-10-CM

## 2014-02-19 HISTORY — DX: Nausea with vomiting, unspecified: R11.2

## 2014-02-19 LAB — BASIC METABOLIC PANEL
BUN: 15 mg/dL (ref 6–23)
CO2: 31 mEq/L (ref 19–32)
CREATININE: 1 mg/dL (ref 0.4–1.2)
Calcium: 9.9 mg/dL (ref 8.4–10.5)
Chloride: 99 mEq/L (ref 96–112)
GFR: 72.01 mL/min (ref >60.00)
Glucose, Bld: 104 mg/dL — ABNORMAL HIGH (ref 70–99)
Potassium: 3.7 mEq/L (ref 3.5–5.1)
Sodium: 138 mEq/L (ref 135–145)

## 2014-02-19 MED ORDER — POTASSIUM CHLORIDE CRYS ER 20 MEQ PO TBCR: EXTENDED_RELEASE_TABLET | ORAL | Status: DC

## 2014-02-19 NOTE — Patient Instructions (Addendum)
Our office will call with labs. Please continue to take potassium tablets. Hold HCTZ until we know what your potassium level is. I have asked pharmacy to supply you with packets rather tablets, as the tablets cannot be crushed. Please got to nutrition classes. Your diet is key to diabetes control.  Please find 2 opportunities to walk for 15 minutes daily. Exercise helps your body process sugar. Cut out refined sugar-that is anything that is sweet when you eat it or drink it. Fruit is the only exception. You may eat as much fresh fruit as you want. Please keep appt. W/Dr Birdie Riddle on 4/30.  Diabetes and Exercise Exercising regularly is important. It is not just about losing weight. It has many health benefits, such as:  Improving your overall fitness, flexibility, and endurance.  Increasing your bone density.  Helping with weight control.  Decreasing your body fat.  Increasing your muscle strength.  Reducing stress and tension.  Improving your overall health. People with diabetes who exercise gain additional benefits because exercise:  Reduces appetite.  Improves the body's use of blood sugar (glucose).  Helps lower or control blood glucose.  Decreases blood pressure.  Helps control blood lipids (such as cholesterol and triglycerides).  Improves the body's use of the hormone insulin by:  Increasing the body's insulin sensitivity.  Reducing the body's insulin needs.  Decreases the risk for heart disease because exercising:  Lowers cholesterol and triglycerides levels.  Increases the levels of good cholesterol (such as high-density lipoproteins [HDL]) in the body.  Lowers blood glucose levels. YOUR ACTIVITY PLAN  Choose an activity that you enjoy and set realistic goals. Your health care provider or diabetes educator can help you make an activity plan that works for you. You can break activities into 2 or 3 sessions throughout the day. Doing so is as good as one long  session. Exercise ideas include:  Taking the dog for a walk.  Taking the stairs instead of the elevator.  Dancing to your favorite song.  Doing your favorite exercise with a friend. RECOMMENDATIONS FOR EXERCISING WITH TYPE 1 OR TYPE 2 DIABETES   Check your blood glucose before exercising. If blood glucose levels are greater than 240 mg/dL, check for urine ketones. Do not exercise if ketones are present.  Avoid injecting insulin into areas of the body that are going to be exercised. For example, avoid injecting insulin into:  The arms when playing tennis.  The legs when jogging.  Keep a record of:  Food intake before and after you exercise.  Expected peak times of insulin action.  Blood glucose levels before and after you exercise.  The type and amount of exercise you have done.  Review your records with your health care provider. Your health care provider will help you to develop guidelines for adjusting food intake and insulin amounts before and after exercising.  If you take insulin or oral hypoglycemic agents, watch for signs and symptoms of hypoglycemia. They include:  Dizziness.  Shaking.  Sweating.  Chills.  Confusion.  Drink plenty of water while you exercise to prevent dehydration or heat stroke. Body water is lost during exercise and must be replaced.  Talk to your health care provider before starting an exercise program to make sure it is safe for you. Remember, almost any type of activity is better than none. Document Released: 01/13/2004 Document Revised: 06/25/2013 Document Reviewed: 04/01/2013 [facility] Patient Information 2014 [employer].

## 2014-02-19 NOTE — Progress Notes (Signed)
Pre visit review using our clinic review tool, if applicable. No additional management support is needed unless otherwise documented below in the visit note. 

## 2014-02-19 NOTE — Progress Notes (Signed)
   Subjective:    Patient ID: Rhonda Morrow, female    DOB: 01/29/1961, 53 y.o.   MRN: [id number]  HPI Comments: Pt is deaf, interpreter is present.  Emesis  This is a new (Had mild nausea Tues. Am, Diarrhea X 1 Tues. Pm.) problem. The current episode started today. The problem occurs 2 to 4 times per day (6 times this am). The problem has been gradually improving. Emesis appearance: Vawter, white, yellow. There has been no fever. Associated symptoms include diarrhea. Pertinent negatives include no abdominal pain, arthralgias, chest pain, chills, coughing, dizziness, fever, headaches or myalgias. Associated symptoms comments: Denies palpitations. Felt SOB after vomiting this am.. Risk factors: new meds (metformin, HCTZ, klor con); chronic low K+, just started HCTZ & K+ supplement, but not taking K+ properly. She has tried nothing for the symptoms.      Review of Systems  Constitutional: Positive for activity change and appetite change. Negative for fever, chills and fatigue.       Missed work today & last 2 days.  HENT: Negative for congestion and sore throat.   Respiratory: Negative for cough.   Cardiovascular: Positive for leg swelling (pt reports lost 2 lbs. weight loss since starting HCTZ in last 3 days.). Negative for chest pain and palpitations.  Gastrointestinal: Positive for nausea, vomiting and diarrhea. Negative for abdominal pain and constipation.  Musculoskeletal: Negative for arthralgias, back pain and myalgias.  Neurological: Negative for dizziness and headaches.       Objective:   Physical Exam  Vitals reviewed. Constitutional: She is oriented to person, place, and time. She appears well-developed and well-nourished. No distress.  HENT:  Head: Normocephalic and atraumatic.  Eyes: Conjunctivae are normal. Right eye exhibits no discharge. Left eye exhibits no discharge.  Cardiovascular: Normal rate, regular rhythm and normal heart sounds.   No murmur heard. Distant heart  tones due to body habitus  Pulmonary/Chest: Effort normal and breath sounds normal. No respiratory distress. She has no wheezes. She has no rales.  Neurological: She is alert and oriented to person, place, and time.  Skin: Skin is warm and dry.  Psychiatric: She has a normal mood and affect. Her behavior is normal. Thought content normal.          Assessment & Plan:  1. Chronic Hypokalemia Just started HTZ & klor con. She is crushing klor-con, but it is not crushable form.  - Basic metabolic panel-will monitor closely. Lab will run stat. Called pharmacy, asked to supply klor con packets rather than tabs-I cannot find them in Epic. Advised patient to go to pharmacy to pick up today. Discussed symptoms of hypokalemia & red flags that indicate she should go to ER. 2. Nausea with vomiting Keeping fluids down. DD: hypokalemia, hypoglycemia, viral gastritis, food poisoning - Basic metabolic panel 3. DM (diabetes mellitus) Newly diagnosed. Poor understanding of disease process, treatment & management, possible complications.  Metformin started 2 days ago. - Amb ref to Medical Nutrition Therapy-MNT Spent more than 15 minutes discussing relationship of diet , exercise & insulin resistance; sugar control; good & bad food choices; impact of exercise on sugar control/insulin regulation.

## 2014-02-19 NOTE — Telephone Encounter (Signed)
Notes Recorded by Irene Pap, NP on 02/19/2014 at 1:52 PM pls call pt: Advise Potassium is nml-start klor con packets. I sent pharmacy note to give packets rather than tabs.  Continue metformin twice daily. Diarrhea should get better. Keep appt. W/Dr Birdie Riddle in 2 weeks.    Spoke with the Patient via the Relay service and she wanted to know about the labs. Reviewed the above results and the patient voiced understanding. She wanted to know if she soul continue the HCTZ, discussed with Orene Desanctis and she advised this is fine because the potassium was WNL. The patient is aware to pick up the potassium and take as directed.      KP

## 2014-02-20 ENCOUNTER — Telehealth: Payer: Self-pay | Admitting: *Deleted

## 2014-02-20 NOTE — Telephone Encounter (Signed)
Pt called using interpretor service.  She is having an issue getting her potassium chloride SA (KLOR-CON M20) 20 MEQ tablet filled (Please give packets, pt unable to swallow tablets.)  The pharmacy told her someone from our office needs to call them ([facility]) regarding this prescription.  She called and spoke to someone in our office yesterday and they told her they would take care of it, but the pharmacy has not heard from anyone as of today.  Please advise.

## 2014-02-23 NOTE — Telephone Encounter (Signed)
Called [facility] [place] and spoke with pharmacy tech. Verbal order given to dispense Potassium Chloride packets 20 MEq #30 with 1 additional refill. Spoke with pts mother to make her aware.

## 2014-03-04 ENCOUNTER — Telehealth: Payer: Self-pay

## 2014-03-04 NOTE — Telephone Encounter (Signed)
Relevant patient education assigned to patient using Emmi. ° °

## 2014-03-05 ENCOUNTER — Ambulatory Visit (INDEPENDENT_AMBULATORY_CARE_PROVIDER_SITE_OTHER): Payer: No Typology Code available for payment source | Admitting: Family Medicine

## 2014-03-05 ENCOUNTER — Encounter: Payer: Self-pay | Admitting: Family Medicine

## 2014-03-05 VITALS — BP 130/76 | HR 92 | Temp 98.4°F | Resp 16 | Wt 244.0 lb

## 2014-03-05 DIAGNOSIS — M25562 Pain in left knee: Secondary | ICD-10-CM | POA: Insufficient documentation

## 2014-03-05 DIAGNOSIS — E119 Type 2 diabetes mellitus without complications: Secondary | ICD-10-CM

## 2014-03-05 DIAGNOSIS — I10 Essential (primary) hypertension: Secondary | ICD-10-CM

## 2014-03-05 DIAGNOSIS — E876 Hypokalemia: Secondary | ICD-10-CM

## 2014-03-05 DIAGNOSIS — M25569 Pain in unspecified knee: Secondary | ICD-10-CM

## 2014-03-05 LAB — BASIC METABOLIC PANEL
BUN: 13 mg/dL (ref 6–23)
CALCIUM: 9.1 mg/dL (ref 8.4–10.5)
CO2: 28 mEq/L (ref 19–32)
Chloride: 100 mEq/L (ref 96–112)
Creatinine, Ser: 0.9 mg/dL (ref 0.4–1.2)
GFR: 87.3 mL/min (ref >60.00)
Glucose, Bld: 88 mg/dL (ref 70–99)
Potassium: 3.7 mEq/L (ref 3.5–5.1)
SODIUM: 136 meq/L (ref 135–145)

## 2014-03-05 NOTE — Patient Instructions (Signed)
Schedule a visit for late July to recheck diabetes Continue the Metformin twice daily We'll notify you of your lab results and adjust your potassium as needed We'll call you with your orthopedic appt for the knee pain You can take tramadol as needed for pain Alternate ice/heat for pain relief Call with any questions or concerns [place]!

## 2014-03-05 NOTE — Assessment & Plan Note (Signed)
New.  Due to twisting mechanism of injury, will refer to ortho.  Pt to take tramadol as needed for pain.  Will follow.

## 2014-03-05 NOTE — Assessment & Plan Note (Signed)
BP much improved since starting HCTZ.  Check BMP to assess K+ and adjust Kdur dose as needed.

## 2014-03-05 NOTE — Progress Notes (Signed)
Pre visit review using our clinic review tool, if applicable. No additional management support is needed unless otherwise documented below in the visit note. 

## 2014-03-05 NOTE — Assessment & Plan Note (Signed)
Pt started on Metformin at last visit.  Tolerating med w/o difficulty.  Pt is working on low carb diet.  Check BMP to assess Cr.

## 2014-03-05 NOTE — Progress Notes (Signed)
   Subjective:    Patient ID: Rhonda Morrow, female    DOB: 07-25-1961, 53 y.o.   MRN: [id number]  HPI HTN- new dx.  Recently started on HCTZ.  Edema has improved.  BP better.  Hx of hypokalemia.  On K+ sprinkles- started taking pills twice daily, switched to packet and is still taking twice daily but will forget the night dose.  DM- new dx.  Started on Metformin.  Has been referred to nutrition management.  Knee pain- pt slipped on wet leaves ~1 week ago and developed L knee pain.  Pain w/ going up stairs, has altered gait.  Difficult bending to clean.     Review of Systems For ROS see HPI     Objective:   Physical Exam  Vitals reviewed. Constitutional: She is oriented to person, place, and time. She appears well-developed and well-nourished. No distress.  obese  HENT:  Head: Normocephalic and atraumatic.  Eyes: Conjunctivae and EOM are normal. Pupils are equal, round, and reactive to light.  Neck: Normal range of motion. Neck supple. No thyromegaly present.  Cardiovascular: Normal rate, regular rhythm, normal heart sounds and intact distal pulses.   No murmur heard. Pulmonary/Chest: Effort normal and breath sounds normal. No respiratory distress.  Abdominal: Soft. She exhibits no distension. There is no tenderness.  Musculoskeletal: She exhibits tenderness (over medial and lateral joint line of L knee, pain over anterior knee cap w/ extension). She exhibits no edema.  Lymphadenopathy:    She has no cervical adenopathy.  Neurological: She is alert and oriented to person, place, and time.  Skin: Skin is warm and dry.  Psychiatric: She has a normal mood and affect. Her behavior is normal.          Assessment & Plan:

## 2014-03-05 NOTE — Assessment & Plan Note (Signed)
Ongoing issue for pt.  Pt is taking her Kdur sprinkles 1-2x/day.  Check labs and adjust dose prn.

## 2014-03-06 ENCOUNTER — Telehealth: Payer: Self-pay | Admitting: Family Medicine

## 2014-03-06 NOTE — Telephone Encounter (Signed)
Relevant patient education assigned to patient using Emmi. ° °

## 2014-03-23 ENCOUNTER — Telehealth: Payer: Self-pay | Admitting: Internal Medicine

## 2014-03-23 ENCOUNTER — Encounter: Payer: Self-pay | Admitting: Internal Medicine

## 2014-03-23 ENCOUNTER — Ambulatory Visit (INDEPENDENT_AMBULATORY_CARE_PROVIDER_SITE_OTHER): Payer: No Typology Code available for payment source | Admitting: Internal Medicine

## 2014-03-23 VITALS — BP 124/80 | HR 87 | Ht 64.0 in | Wt 241.0 lb

## 2014-03-23 DIAGNOSIS — J45909 Unspecified asthma, uncomplicated: Secondary | ICD-10-CM

## 2014-03-23 DIAGNOSIS — J4540 Moderate persistent asthma, uncomplicated: Secondary | ICD-10-CM

## 2014-03-23 NOTE — Patient Instructions (Addendum)
Asthma appears well controlled Change dulera to  Symbicort 160/4.23mcg 2 puffs Twice daily  Continue on Protonix daily  I have written to PCP Annye Asa, MD about iintolerance to metformin and potassium; recommend you continue to take them till you hear from her  .  follow up  - Dr. Chase Caller in 6 months and As needed    - Please contact office for sooner follow up if symptoms do not improve or worsen or seek emergency care

## 2014-03-23 NOTE — Telephone Encounter (Signed)
Called interpreter service and lm for pt to contact office in regards to setting up an appt to discuss intolerance to medications.

## 2014-03-23 NOTE — Telephone Encounter (Signed)
If pt is having difficulty w/ medication, we need an OV to discuss which medication and how best to proceed w/ changes

## 2014-03-23 NOTE — Telephone Encounter (Signed)
Hi Rhonda Morrow  AS best I can gather from [name] today in office she is having intolerance to K dur or Metformin or both.   THanks  Dr. Brand Males, M.D., [facility].C.P Pulmonary and Critical Care Medicine Staff Physician [facility] Pager: [phone], If no answer or between  15:00h - 7:00h: call [phone]  03/23/2014 11:50 AM

## 2014-03-23 NOTE — Progress Notes (Signed)
Subjective:    Patient ID: Rhonda Morrow, female    DOB: 13-May-1961, 53 y.o.   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 Here with interpreter.  reports that she has never smoked. She does not have any smokeless tobacco history on file. Body mass index is 42.64 kg/(m^2). Husband is patient of Dr Gwenette Greet and has O2 dependency though is never smoker. She works in [employer]   Reports dyspnea. Especially with exertion walking or hurrying and walking and climbing steps. Stops half way through steps. Dyspnea also made worse by sitting in small cars. No dyspnea for eating or change of clothes. Relieved by rest and drinking water or sitting in large SUV/Van. Feels air hungry. INsidious onset. Slowly progressive. Says people can hear her dyspneic. Associated symptoms: 20# weight gain x 1 year,  occasional cough only during URI, wheezing, popping sounds occasionally in ear, hx of occasional URI and chest tightness. Says chest tightness improved by prevacid. Uses 3 pillows to sleep for years. Unclear if there is  paroxysmal nocturnal dyspnea or not. 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 Denies family hx of asthma. No pet birds. Has dog outside. No cats. No mold exposure but maybe in bathtub (not sure)  Walking desaturation test 185 feet x 3 laps"   REC Do not know why you are short of breath  Please have breathing test called PFT  BAsed on those test results I will call you and decide next step   OV 03/11/2012 Here to review test results. Interpreter is Rebecca Martinique; patient deaf and mute  PFt date 01/24/12 shows mixed obstruction - restriction but greater restriction with low dlco   - fev1 1/48L/58%, Ratio 82, TLC 56,  DLCO 14/51%, No BD response  CT chest 02/09/12:  The ct chest does not show any scarring.     In terms of symptoms: current wet, cold, rainy weather  bothering her but only mildly - more sneezing. IN terms of dyspnea: still present especially with stairs, walking fast, being in a rush. Improved with rest and drinking water. Slowly getting worse. DEnies current cough. No other new problems. Also, feeling of fullness and dyspnea after eating. There is also some chest tightnes and hurt when yawnining; improves with water. Lot of questions on why she is dyspneic incluing if obesity is contributing.   Past, Family, Social reviewed: no change since last visit   REC  Please have methacholine challenge test for asthma  We will call you with results  If test positive, you will come in to start asthma treatment  IF test negative, will refer you to pulmonary rehab for weight related shortness of breath   OV 04/18/2012 Here to discuss results of methacholine challenge.No interim complaints. Interepreter is Wells Guiles  positive methachole challenge test ofr 03/19/12  - good cooperation per RT. 24% Fev1 drop and 17% FVC drop at dose 0.25   #Shortness of breath  - this is due to asthma and weight  - for asthma Please start symbicort 80/4.5 2 puff twice daily - take sample, and show technique (script with refills done)  - I have referred you to pulmonary rehabilitation for shortness of breath exercises  #Weight management - I will discuss diet with you next visit  #Followup 6-8 weeks or sooner if needed    OV 06/24/2012 - followup  dyspnea on basis of asthma and obesity. Here with deaf mute intepreter.   - Reports improved dyspnea following starting symbicort > 90% compliance . Using albuterol for rescue only which she has used only occasionally; only once or two times per last week.  Feels she can sleep and rest better now but I am unable to sort out what is really better. Walking steps still a problem and in fact NOT improved. She is still though resorting to albuterol for rescue and drinking water. No associated weight loss. She has not attended  rehab; says [facility] has not called back on phone calls.    - Obesity Body mass index is 42.43 kg/(m^2).: diet rviewed. Eating way too many high and medium glycemic foods. Eats very little low glycemic foods   #ASthma  - Continue symbicort 80/4.5 2 puff twice daily  #shortness of breath and obesity  - I have emailed Phyllis at rehab and will get back to you about starting there in rehabreferred you to pulmonary rehabilitation for shortness of breath exercises  #obesity  - follow [facility] low glycemic diet for weight loss  #Followup 4 weeks or sooner if needed   OV 08/05/2012 Followup asthma and obesity  Asthma: very well controlled on symbicort. Attending Rehab  OBesity: - weight 243# and Body mass index is 41.81 kg/(m^2). - down from BMI 42.43 last ov (down 10# since May 2013 and 4# since last OV). Weight loss is intentional. She is confused between my dietary advice and advice given at rehab. Therefore, she is eating lot of high glycemic foods like ripe bananas.        OV 10/02/2012  Estimated Body mass index is 42.69 kg/(m^2) as calculated from the following:   Height as of this encounter: 5\' 4" (1.626 m).   Weight as of this encounter: 248 lb 11.2 oz(112.81 kg).   ASthma - well controlled. No problems  Multifactrial dyspnea - resolved with rehab. Going to [employer]  Weight  - gained weight after last visit. Stil says she is confused about dietary advice between what I have instructed and what RD at rehab has instructed her. She is poorly compliant with the low glycemic foods I have outlined  #ASthma  - well controlled  - continue current medication regimen  #WEight  - follow the diet advice I have outlined; eat only foods in the left lane  #Followup  3 months  OV 02/03/2013  Followup for asthma. She continues on Symbicort 2 puff 2 times a day without any change. She uses albuterol for rescue once every few days. There no nocturnal awakenings or chest tightness. She does  continue to have her chronic dyspnea on exertion for which she was attending pulmonary rehabilitation until she fell down due to benign positional vertigo and sustained a hematoma in her right thigharound end February 2014. She is now doing well and back to baseline. In terms of weight loss she has not followed the diet and has not lost any weight. She has looked into bariatric surgery but found it too expensive and her insurance will not cover it . 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 Since then asthma overall stable without any admissions or exacerbations. She continues to have good days and bad days but albuterol rescue use is limited to 3 times a week at the most. She has worsening dyspnea when she exerts herself like lifting heavy objects but this could easily be from obesity. She does take albuterol preemptively in these situations. No nocturnal awakening or wheezing.  she has not had a flu shot but will have it today  Spirometry today shows FEV1 1.37 L or 62%. FVC 1.5 L/56%. Ratio of 89: Shows continued stability with moderate airways obstruction >>no changes   08/15/2013 Acute OV  Complains of increased SOB, chest tightness x5days , mild cough and sneezing. Having heartburn and indigestion, esp at night despite protonix daily . Seen by GI few days ago , set up for endoscopy for dysphagia. No fever, discolored mucus, orthopnea, PNA, leg swelling, calf pain . Has taken her Ventolin with relief. Symptoms are worse at night , feels something come into chest . No vomiting. No missed doses of symbicort.  Took Allegra yesterday for sneezing. Son has been sick w/ cold like  symptoms.  >>increased symbicort 160, added pepcid and allegra   09/01/13 Follow up  Returns because she ran out of Symbicort this am.  Accompanied by her interpretor. Says she has done so much better over last 2 weeks with decreased dyspnea, wheezing on new dose of symbicort. Seen 2 weeks ago with asthma flare with suspected GERD and AR triggers.  Symbicort dose was increased and recommended to add allegra and pepcid .daily . Says she took her last symbicort 160 yesterday , noticed this am that her breathing was not as good. Wants rx for new symbicort. No chest pain , orthopnea, edema, fever, n/v, or calf pain.  Also off allegra over last week.    RED Restart Symbicort 160/4.70mcg 2 puffs Twice daily  Continue on Pepcid 20mg  At bedtime   GERD diet  Continue on Protonix daily  Restart Allegra 180mg  daily for 1 week then As needed  For drainage.  follow up Dr. Chase Caller in 4 weeks and As needed   Please contact office for sooner follow up if symptoms do not improve or worsen or seek emergency care    OV 09/18/2013  Followup for moderate persistent asthma. Accompanied by her interpreter. She was seen by my nurse practitioner Patricia Nettle just under a month ago. Her review those notes and noted the changes. Currently this is a followup visit in response to that visit. Patient is doing well. Asthma is extremely well controlled. Albuterol use is less than 2 times per week. No nocturnal awakenings. No wheeze. Compliant with her medications. She feels she is doing much better on the higher dose Symbicort and on the Pepcid. She believes she did not have the flu shot this season but our records indicate that she already had  Past medical history reviewed no changes noted other than as above   OV 03/23/2014  Chief Complaint  Patient presents with  . Follow-up    Pt states since last OV breathing is unchanged. C/o SOB when bending over and with activity, c/o mild chest pain intermittently, and  mild dry cough. Pt states symbicort isnt helping.    Followup moderate persistent asthma. Accompanied by interpreter. She says that since her last visit in November 2014 she was admitted to [facility] forl pedal edema. I do not find evidence of this. Also in review of the chart and  in talking to her,  in the interim she is now on metformin which has helped her lose weight from BMI of 44 tpo41 currently. In addition she has been diagnosed with chronic hypokalemia and was started on potassium supplements. She says that both metformin and potassium supplements are  causing her GI and respiratory discomfort. In addition she also says that when she bends over she gets dyspneic.  Other than the asthma itself is stable and well controlled. Last albuterol use was 2 weeks ago. No nocturnal symptoms. Albuterol uses only rare. She is currently on [facility] and she is unclear why. She wants to go back to her high dose Symbicort which helped her a lot  Review of Systems    positive for shortness of breath and weight loss Negative for everything else Objective:   Physical Exam Vitals reviewed. Constitutional: She is oriented to person, place, and time. She appears well-developed and well-nourished. No distress.  Body mass index is 41.35 kg/(m^2).    HENT:  Head: Normocephalic and atraumatic.  Right Ear: External ear normal.  Left Ear: External ear normal.  Mouth/Throat: Oropharynx is clear and moist. No oropharyngeal exudate.  No thrush  Eyes: Conjunctivae and EOM are normal. Pupils are equal, round, and reactive to light. Right eye exhibits no discharge. Left eye exhibits no discharge. No scleral icterus.  Neck: Normal range of motion. Neck supple. No JVD present. No tracheal deviation present. No thyromegaly present.  Cardiovascular: Normal rate, regular rhythm, normal heart sounds and intact distal pulses.  Exam reveals no gallop and no friction rub.   No murmur heard. Pulmonary/Chest: Effort  normal and breath sounds normal. No respiratory distress. She has no wheezes. She has no rales. She exhibits no tenderness.  Abdominal: Soft. Bowel sounds are normal. She exhibits no distension and no mass. There is no tenderness. There is no rebound and no guarding.  Musculoskeletal: Normal range of motion. She exhibits no edema and no tenderness.  Lymphadenopathy:    She has no cervical adenopathy.  Neurological: She is alert and oriented to person, place, and time. She has normal reflexes. No cranial nerve deficit. She exhibits normal muscle tone. Coordination normal.  Skin: Skin is warm and dry. No rash noted. She is not diaphoretic. No erythema. No pallor.  Psychiatric: She has a normal mood and affect. Her behavior is normal. Judgment and thought content normal.         Assessment & Plan:

## 2014-03-24 NOTE — Assessment & Plan Note (Signed)
Asthma appears well controlled Change dulera to  Symbicort 160/4.38mcg 2 puffs Twice daily  Continue on Protonix daily  I have written to PCP Annye Asa, MD about iintolerance to metformin and potassium; recommend you continue to take them till you hear from her  .  follow up  - Dr. Chase Caller in 6 months and As needed    - Please contact office for sooner follow up if symptoms do not improve or worsen or seek emergency c

## 2014-03-31 ENCOUNTER — Encounter: Payer: Self-pay | Admitting: Family Medicine

## 2014-04-09 ENCOUNTER — Encounter: Payer: Self-pay | Admitting: General Practice

## 2014-04-09 ENCOUNTER — Ambulatory Visit (INDEPENDENT_AMBULATORY_CARE_PROVIDER_SITE_OTHER): Payer: No Typology Code available for payment source | Admitting: Family Medicine

## 2014-04-09 ENCOUNTER — Encounter: Payer: Self-pay | Admitting: Family Medicine

## 2014-04-09 VITALS — BP 122/82 | HR 87 | Temp 98.6°F | Resp 16 | Wt 238.0 lb

## 2014-04-09 DIAGNOSIS — M549 Dorsalgia, unspecified: Secondary | ICD-10-CM

## 2014-04-09 DIAGNOSIS — L290 Pruritus ani: Secondary | ICD-10-CM

## 2014-04-09 DIAGNOSIS — R350 Frequency of micturition: Secondary | ICD-10-CM

## 2014-04-09 DIAGNOSIS — J309 Allergic rhinitis, unspecified: Secondary | ICD-10-CM

## 2014-04-09 DIAGNOSIS — N3289 Other specified disorders of bladder: Secondary | ICD-10-CM

## 2014-04-09 LAB — POCT URINALYSIS DIPSTICK
Bilirubin, UA: NEGATIVE
Glucose, UA: NEGATIVE
Ketones, UA: NEGATIVE
Leukocytes, UA: NEGATIVE
NITRITE UA: NEGATIVE
PROTEIN UA: NEGATIVE
RBC UA: NEGATIVE
Spec Grav, UA: 1.02
UROBILINOGEN UA: 0.2
pH, UA: 6

## 2014-04-09 MED ORDER — CETIRIZINE HCL 10 MG PO TABS: 10.0000 mg | ORAL_TABLET | Freq: Every day | ORAL | Status: DC

## 2014-04-09 MED ORDER — FLUTICASONE PROPIONATE 50 MCG/ACT NA SUSP: 2.0000 | Freq: Every day | NASAL | Status: DC

## 2014-04-09 MED ORDER — PROMETHAZINE-DM 6.25-15 MG/5ML PO SYRP: 5.0000 mL | ORAL_SOLUTION | Freq: Four times a day (QID) | ORAL | Status: DC | PRN

## 2014-04-09 NOTE — Progress Notes (Signed)
   Subjective:    Patient ID: Rhonda Morrow, female    DOB: 06-Oct-1961, 53 y.o.   MRN: [id number]  HPI Rhonda Morrow told her that 'she didn't look good' and told her to take the day off.  Pt reports going back and forth to the restroom frequently.  Pt is having productive cough, intermittent, x2 weeks.  No wheezing or SOB.  + dysuria x2 days.  + back pain, suprapubic pain.  + subjective fever.  Denies drainage or sneezing.  Anal itching- sxs started 'a couple of months ago'.  Hx of hemorrhoids.  Pt unable to clearly report whether itching is perirectal or more lateral on the cheeks.   Review of Systems For ROS see HPI     Objective:   Physical Exam  Vitals reviewed. Constitutional: She appears well-developed and well-nourished. No distress.  HENT:  Head: Normocephalic and atraumatic.  Right Ear: Tympanic membrane normal.  Left Ear: Tympanic membrane normal.  Nose: Mucosal edema and rhinorrhea present. Right sinus exhibits no maxillary sinus tenderness and no frontal sinus tenderness. Left sinus exhibits no maxillary sinus tenderness and no frontal sinus tenderness.  Mouth/Throat: Mucous membranes are normal. Posterior oropharyngeal erythema (w/ PND) present.  Eyes: Conjunctivae and EOM are normal. Pupils are equal, round, and reactive to light.  Neck: Normal range of motion. Neck supple.  Cardiovascular: Normal rate, regular rhythm and normal heart sounds.   Pulmonary/Chest: Effort normal and breath sounds normal. No respiratory distress. She has no wheezes. She has no rales.  Abdominal: Soft. Bowel sounds are normal. She exhibits no distension. There is no tenderness. There is no rebound.  Genitourinary: Rectal exam shows no external hemorrhoid, no fissure and no mass.  Lymphadenopathy:    She has no cervical adenopathy.  Skin: Skin is warm and dry. No erythema.  Pt pointing to lateral cheek as site of itching          Assessment & Plan:

## 2014-04-09 NOTE — Progress Notes (Signed)
Pre visit review using our clinic review tool, if applicable. No additional management support is needed unless otherwise documented below in the visit note. 

## 2014-04-09 NOTE — Patient Instructions (Signed)
Follow up as needed STOP the Allegra START the Zyrtec daily for the post nasal drip- which is causing your cough Start the Flonase daily- 2 sprays each nostril- to decrease post nasal drip Drink plenty of fluids There was no rash or obvious hemorrhoid causing the itching Call with any questions or concerns Hang in there!

## 2014-04-11 LAB — URINE CULTURE: Colony Count: 60000

## 2014-04-12 NOTE — Assessment & Plan Note (Signed)
New.  No obvious abnormality to account for itching.  No hemorrhoid, fissure, or other abnormality.  Pt's itching seems to be more lateral than actually rectal.  Reviewed hygiene and dry skin care.  Will follow.

## 2014-04-12 NOTE — Assessment & Plan Note (Signed)
New.  Suspect this is due to pt's cough.  Treat underlying allergic triggers and monitor for improvement.  May need to see urology if sxs don't improve.  Pt expressed understanding and is in agreement w/ plan.

## 2014-04-12 NOTE — Assessment & Plan Note (Signed)
Severe.  Suspect this is cause of pt's cough and subsequently causing bladder spasm.  Switch Allegra to Zyrtec.  Start nasal steroid.  Reviewed supportive care and red flags that should prompt return.  Pt expressed understanding and is in agreement w/ plan.

## 2014-04-12 NOTE — Assessment & Plan Note (Addendum)
Recurrent issue for pt.  Suspect this is due to coughing.  Treat underlying allergic triggers and monitor for improvement.  Will follow.

## 2014-04-13 ENCOUNTER — Encounter: Payer: Self-pay | Admitting: Dietician

## 2014-04-13 ENCOUNTER — Encounter: Payer: No Typology Code available for payment source | Attending: Nurse Practitioner | Admitting: Dietician

## 2014-04-13 VITALS — Ht 64.0 in | Wt 236.2 lb

## 2014-04-13 DIAGNOSIS — E119 Type 2 diabetes mellitus without complications: Secondary | ICD-10-CM | POA: Insufficient documentation

## 2014-04-13 DIAGNOSIS — Z713 Dietary counseling and surveillance: Secondary | ICD-10-CM | POA: Insufficient documentation

## 2014-04-13 NOTE — Progress Notes (Signed)
Appt start time: 0915 end time:  1040.   Assessment:  Patient was seen on  04/13/2014 for individual diabetes education. Patient is hearing impaired with interpretor. Patient does not check blood sugar. She was recently diagnosed with diabetes and had not previously received education. Both her her mother and husband have diabetes. 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 She reports that she is always very tired from work. She lives with her husband, mother and 3 children at home. She and her mother  take turns doing the food shopping. Her mother cooks most of the time. She and her husband cook occasionally. She eats out about 3 times a week at various places. She has recently been eating less in the evening because the metformin (2 times a day) causes her stomach to be upset.   Current HbA1c: 6.7 on 02/16/14  Preferred Learning Style:  Visual  Hands on  Learning Readiness:   Ready  MEDICATIONS: Metformin  DIETARY INTAKE:   Usual eating pattern includes 2-3 meals and 1 snack per day. May skip breakfast or meals when off work (works Wednesday-Friday)  24-hr recall:  B ( AM): will sometimes have yogurt and/or regular soda or water or iced tea Snk ( AM): none L ( PM): may buy a salad, yogurt or leftovers or a sandwich, regular soda or water  Snk ( PM): none  D ( PM): eating smaller portions since taking metformin (since May), meat with rice and vegetables Snk ( PM): dry cereal, fruit, candy (m&m's or snickers), water or gatorade Beverages: water, gatorade, regular soda, tea  Usual physical activity: none, has difficulty going up and down stairs  Estimated energy needs: 1800 calories 200 g carbohydrates 135 g protein 50 g fat  Progress Towards Goal(s):  In progress.    Nutritional Diagnosis:  Max-2.1 Inpaired nutrition utilization As related to glucose metabolism.  As evidenced by HbA1C 6.7%.    Intervention:  Nutrition counseling provided.  Discussed diabetes disease  process and treatment options.  Discussed physiology of diabetes and role of obesity on insulin resistance.  Encouraged moderate weight reduction to improve glucose levels.  Discussed role of medications and diet in glucose control  Provided education on macronutrients on glucose levels.  Provided education on carb counting, importance of regularly scheduled meals/snacks, and meal planning  Discussed effects of physical activity on glucose levels and long-term glucose control.  Recommended 150 minutes of physical activity/week.  Reviewed patient medications.  Discussed role of medication on blood glucose and possible side effects  Discussed blood glucose monitoring and interpretation.  Discussed recommended target ranges and individual ranges.    Described short-term complications: hyper- and hypo-glycemia.  Discussed causes,symptoms, and treatment options.  Discussed prevention, detection, and treatment of long-term complications.  Discussed the role of prolonged elevated glucose levels on body systems.  Discussed role of stress on blood glucose levels and discussed strategies to manage psychosocial issues.  Discussed recommendations for long-term diabetes self-care.  Established checklist for medical, dental, and emotional self-care.  Teaching Method Utilized: Visual Hands on  Handouts given during visit include:  Yellow Card  HbA1C Handout  Living Well with Diabetes booklet   Barriers to learning/adherence to lifestyle change: Shortness of breath with exercise  Diabetes self-care support plan:   [facility] support group  Family   Demonstrated degree of understanding via:  Teach Back   Monitoring/Evaluation:  Dietary intake, exercise, and body weight prn.

## 2014-04-13 NOTE — Patient Instructions (Signed)
Goals:  Follow Diabetes Meal Plan as instructed using the plate method  (filling half your plate with non starchy vegetables, 1/4 protein, 1/4 starchy vegetables or grains)  Eat 3 meals and 2 snacks, every 3-5 hrs  Limit carbohydrate intake to 15 grams carbohydrate/snack  Add lean protein foods to meals/snacks   Look for yogurts that have less than 12 gm of total carbohydrates  Aim for a goal 30 mins of physical activity daily. Start by trying to walk or swim for 10-15 minutes daily. Look into [employer] or the [employer].   Limit sugar sweetened beverages like gatorade, regular soda, sweet tea. Try sugar sweeteners instead.   [employer] Protein bars for a snack.

## 2014-04-20 ENCOUNTER — Other Ambulatory Visit: Payer: Self-pay | Admitting: Family Medicine

## 2014-04-20 NOTE — Telephone Encounter (Signed)
Med filled.  

## 2014-05-06 ENCOUNTER — Encounter: Payer: Self-pay | Admitting: Family Medicine

## 2014-05-06 ENCOUNTER — Ambulatory Visit (INDEPENDENT_AMBULATORY_CARE_PROVIDER_SITE_OTHER): Payer: No Typology Code available for payment source | Admitting: Family Medicine

## 2014-05-06 VITALS — BP 122/80 | HR 80 | Temp 97.9°F | Resp 16 | Wt 234.4 lb

## 2014-05-06 DIAGNOSIS — W57XXXA Bitten or stung by nonvenomous insect and other nonvenomous arthropods, initial encounter: Secondary | ICD-10-CM | POA: Insufficient documentation

## 2014-05-06 DIAGNOSIS — B86 Scabies: Secondary | ICD-10-CM | POA: Insufficient documentation

## 2014-05-06 DIAGNOSIS — T148 Other injury of unspecified body region: Secondary | ICD-10-CM

## 2014-05-06 MED ORDER — PERMETHRIN 5 % EX CREA: 1.0000 "application " | TOPICAL_CREAM | Freq: Once | CUTANEOUS | Status: DC

## 2014-05-06 MED ORDER — HYDROXYZINE HCL 25 MG PO TABS: 25.0000 mg | ORAL_TABLET | Freq: Three times a day (TID) | ORAL | Status: DC | PRN

## 2014-05-06 NOTE — Progress Notes (Signed)
   Subjective:    Patient ID: Rhonda Morrow, female    DOB: 1961-11-04, 53 y.o.   MRN: [id number]  HPI Itching- pt reports 2 weeks of itching, pt thinks she has bed bugs.  Itching is worse at night, 'i feel something on me'.  Saw a bug in her bed.  Pt having itching between fingers and toe.  No rash visible.  Pt w/ visible bites on arms and legs.  Husband w/ similar symptoms.   Review of Systems For ROS see HPI     Objective:   Physical Exam  Vitals reviewed. Constitutional: She is oriented to person, place, and time. She appears well-developed and well-nourished. No distress.  Musculoskeletal: She exhibits no edema.  Neurological: She is alert and oriented to person, place, and time.  Skin: Skin is warm and dry. No rash noted. No erythema.  Visible discrete bites on arms, legs  Psychiatric: She has a normal mood and affect. Her behavior is normal. Thought content normal.          Assessment & Plan:

## 2014-05-06 NOTE — Patient Instructions (Signed)
Follow up as needed Apply the Elimite from your chin down- covering your whole body.  Sleep in this overnight, wash it off in the morning- make sure you use a clean towel to dry off Wash all sheets and towels If the itching continues after 1 week, repeat the treatment Use the Hydroxyzine as needed for itching- up to 3x/day.  May cause some drowsiness You may need to replace your mattress to get rid of the bed bugs Call with any questions or concerns Hang in there!  Scabies Scabies are small bugs (mites) that burrow under the skin and cause red bumps and severe itching. These bugs can only be seen with a microscope. Scabies are highly contagious. They can spread easily from person to person by direct contact. They are also spread through sharing clothing or linens that have the scabies mites living in them. It is not unusual for an entire family to become infected through shared towels, clothing, or bedding.  HOME CARE INSTRUCTIONS   Your caregiver may prescribe a cream or lotion to kill the mites. If cream is prescribed, massage the cream into the entire body from the neck to the bottom of both feet. Also massage the cream into the scalp and face if your child is less than 31 year old. Avoid the eyes and mouth. Do not wash your hands after application.  Leave the cream on for 8 to 12 hours. Your child should bathe or shower after the 8 to 12 hour application period. Sometimes it is helpful to apply the cream to your child right before bedtime.  One treatment is usually effective and will eliminate approximately 95% of infestations. For severe cases, your caregiver may decide to repeat the treatment in 1 week. Everyone in your household should be treated with one application of the cream.  New rashes or burrows should not appear within 24 to 48 hours after successful treatment. However, the itching and rash may last for 2 to 4 weeks after successful treatment. Your caregiver may prescribe a medicine  to help with the itching or to help the rash go away more quickly.  Scabies can live on clothing or linens for up to 3 days. All of your child's recently used clothing, towels, stuffed toys, and bed linens should be washed in hot water and then dried in a dryer for at least 20 minutes on high heat. Items that cannot be washed should be enclosed in a plastic bag for at least 3 days.  To help relieve itching, bathe your child in a cool bath or apply cool washcloths to the affected areas.  Your child may return to school after treatment with the prescribed cream. SEEK MEDICAL CARE IF:   The itching persists longer than 4 weeks after treatment.  The rash spreads or becomes infected. Signs of infection include red blisters or yellow-tan crust. Document Released: 10/23/2005 Document Revised: 01/15/2012 Document Reviewed: 03/03/2009 [facility] Patient Information 2015 [place], [place]. This information is not intended to replace advice given to you by your health care provider. Make sure you discuss any questions you have with your health care provider.

## 2014-05-06 NOTE — Progress Notes (Signed)
Pre visit review using our clinic review tool, if applicable. No additional management support is needed unless otherwise documented below in the visit note. 

## 2014-05-07 ENCOUNTER — Telehealth: Payer: Self-pay | Admitting: Family Medicine

## 2014-05-07 NOTE — Telephone Encounter (Signed)
Caller name: Nimrat (nterprete) Relation to pt: self  Call back number:[phone] [facility]acy: [facility] [address]  Reason for call:   Pt states that the rx permethrin (ELIMITE) 5 % cream is not strong enough.  Still itching.  Can she get [facility].

## 2014-05-07 NOTE — Telephone Encounter (Signed)
Spoke with patient through interpreter and advised that it is expected that the itching will not stop immediately. Think of it as a mosquito bite. The mosquito is gone but the itch remains. Advised to continue with the medication. Understanding relayed.

## 2014-05-08 NOTE — Assessment & Plan Note (Addendum)
New.  Pt w/ obvious bites on arms and legs.  Start hydroxyzine for itching.  Reviewed need to either treat or throw away mattress.  Pt expressed understanding and is in agreement w/ plan.

## 2014-05-08 NOTE — Assessment & Plan Note (Signed)
New.  Pt's itching of finger webbing and between toes consistent w/ scabies.  As is the fact that pt's husband has similar sxs.  Start Elimite.  Reviewed supportive care and red flags that should prompt return.  Pt expressed understanding and is in agreement w/ plan.

## 2014-05-21 ENCOUNTER — Other Ambulatory Visit: Payer: Self-pay | Admitting: Family Medicine

## 2014-06-01 ENCOUNTER — Encounter: Payer: Self-pay | Admitting: Family Medicine

## 2014-06-01 ENCOUNTER — Ambulatory Visit (INDEPENDENT_AMBULATORY_CARE_PROVIDER_SITE_OTHER): Payer: No Typology Code available for payment source | Admitting: Family Medicine

## 2014-06-01 ENCOUNTER — Encounter: Payer: Self-pay | Admitting: General Practice

## 2014-06-01 VITALS — BP 120/84 | HR 72 | Temp 98.0°F | Resp 16 | Wt 233.4 lb

## 2014-06-01 DIAGNOSIS — E119 Type 2 diabetes mellitus without complications: Secondary | ICD-10-CM

## 2014-06-01 LAB — BASIC METABOLIC PANEL
BUN: 14 mg/dL (ref 6–23)
CO2: 31 mEq/L (ref 19–32)
Calcium: 9.3 mg/dL (ref 8.4–10.5)
Chloride: 102 mEq/L (ref 96–112)
Creatinine, Ser: 0.9 mg/dL (ref 0.4–1.2)
GFR: 89.56 mL/min (ref >60.00)
GLUCOSE: 92 mg/dL (ref 70–99)
POTASSIUM: 3.1 meq/L — AB (ref 3.5–5.1)
Sodium: 140 mEq/L (ref 135–145)

## 2014-06-01 LAB — HEPATIC FUNCTION PANEL
ALK PHOS: 57 U/L (ref 39–117)
ALT: 19 U/L (ref 0–35)
AST: 19 U/L (ref 0–37)
Albumin: 3.5 g/dL (ref 3.5–5.2)
BILIRUBIN TOTAL: 0.5 mg/dL (ref 0.2–1.2)
Bilirubin, Direct: 0 mg/dL (ref 0.0–0.3)
Total Protein: 7 g/dL (ref 6.0–8.3)

## 2014-06-01 LAB — MICROALBUMIN / CREATININE URINE RATIO
Creatinine,U: 449.8 mg/dL
MICROALB/CREAT RATIO: 0.3 mg/g (ref 0.0–30.0)
Microalb, Ur: 1.5 mg/dL (ref 0.0–1.9)

## 2014-06-01 LAB — HEMOGLOBIN A1C: Hgb A1c MFr Bld: 6.4 % (ref 4.6–6.5)

## 2014-06-01 LAB — LIPID PANEL
CHOL/HDL RATIO: 3
CHOLESTEROL: 155 mg/dL (ref 0–200)
HDL: 48.6 mg/dL (ref >39.00)
LDL Cholesterol: 92 mg/dL (ref 0–99)
NonHDL: 106.4
Triglycerides: 72 mg/dL (ref 0.0–149.0)
VLDL: 14.4 mg/dL (ref 0.0–40.0)

## 2014-06-01 LAB — TSH: TSH: 1 u[IU]/mL (ref 0.35–4.50)

## 2014-06-01 NOTE — Patient Instructions (Signed)
Follow up in 3-4 months to recheck diabetes We'll notify you of your lab results and make any changes if needed Keep up the good work on weight loss!  You're doing great! We'll call you with your eye appt Call with any questions or concerns Enjoy the rest of your summer!!!

## 2014-06-01 NOTE — Progress Notes (Signed)
Pre visit review using our clinic review tool, if applicable. No additional management support is needed unless otherwise documented below in the visit note. 

## 2014-06-01 NOTE — Assessment & Plan Note (Signed)
Chronic problem.  Pt is working on healthy diet and weight loss.  Taking meds w/o difficulty.  Will refer for eye exam.  Check labs.  Adjust meds prn

## 2014-06-01 NOTE — Progress Notes (Signed)
   Subjective:    Patient ID: Rhonda Morrow, female    DOB: 1961/01/15, 53 y.o.   MRN: [id number]  HPI DM- relatively new dx for pt.  On Metformin.  Pt reports some vision changes.  Not on ACE/ARB.  Denies CP, SOB, HAs, abd pain, N/V/D, edema.  No numbness/tingling.  Pt has seen nutrition.  Working on low carb diet and weight loss.   Review of Systems For ROS see HPI     Objective:   Physical Exam  Vitals reviewed. Constitutional: She is oriented to person, place, and time. She appears well-developed and well-nourished. No distress.  HENT:  Head: Normocephalic and atraumatic.  Eyes: Conjunctivae and EOM are normal. Pupils are equal, round, and reactive to light.  Neck: Normal range of motion. Neck supple. No thyromegaly present.  Cardiovascular: Normal rate, regular rhythm, normal heart sounds and intact distal pulses.   No murmur heard. Pulmonary/Chest: Effort normal and breath sounds normal. No respiratory distress.  Abdominal: Soft. She exhibits no distension. There is no tenderness.  Musculoskeletal: She exhibits no edema.  Lymphadenopathy:    She has no cervical adenopathy.  Neurological: She is alert and oriented to person, place, and time.  Skin: Skin is warm and dry.  Psychiatric: She has a normal mood and affect. Her behavior is normal.          Assessment & Plan:

## 2014-06-02 ENCOUNTER — Telehealth: Payer: Self-pay | Admitting: Family Medicine

## 2014-06-02 NOTE — Telephone Encounter (Signed)
Can we find an office that has interpreter services. Pt does not have access to this service outside of [facility].

## 2014-06-02 NOTE — Telephone Encounter (Signed)
Pt called stating Dr. Leonette Most office, Ophthalmology, does not provide interpreters.  I called [employer] and was advised their pt's must come with their own interpreters. Please advise

## 2014-08-12 ENCOUNTER — Other Ambulatory Visit: Payer: Self-pay | Admitting: General Practice

## 2014-08-12 MED ORDER — METFORMIN HCL 500 MG PO TABS: 500.0000 mg | ORAL_TABLET | Freq: Two times a day (BID) | ORAL | Status: DC

## 2014-08-12 MED ORDER — PANTOPRAZOLE SODIUM 40 MG PO TBEC: 40.0000 mg | DELAYED_RELEASE_TABLET | Freq: Every day | ORAL | Status: DC

## 2014-08-18 ENCOUNTER — Other Ambulatory Visit: Payer: Self-pay | Admitting: General Practice

## 2014-08-18 MED ORDER — PANTOPRAZOLE SODIUM 40 MG PO TBEC: 40.0000 mg | DELAYED_RELEASE_TABLET | Freq: Every day | ORAL | Status: DC

## 2014-09-01 ENCOUNTER — Ambulatory Visit (INDEPENDENT_AMBULATORY_CARE_PROVIDER_SITE_OTHER): Payer: No Typology Code available for payment source | Admitting: Family Medicine

## 2014-09-01 ENCOUNTER — Encounter: Payer: Self-pay | Admitting: Family Medicine

## 2014-09-01 VITALS — BP 130/90 | HR 89 | Temp 98.2°F | Resp 16 | Wt 235.4 lb

## 2014-09-01 DIAGNOSIS — E119 Type 2 diabetes mellitus without complications: Secondary | ICD-10-CM

## 2014-09-01 DIAGNOSIS — I10 Essential (primary) hypertension: Secondary | ICD-10-CM

## 2014-09-01 LAB — BASIC METABOLIC PANEL
BUN: 13 mg/dL (ref 6–23)
CHLORIDE: 102 meq/L (ref 96–112)
CO2: 20 mEq/L (ref 19–32)
Calcium: 9.1 mg/dL (ref 8.4–10.5)
Creatinine, Ser: 0.8 mg/dL (ref 0.4–1.2)
GFR: 91.94 mL/min (ref >60.00)
Glucose, Bld: 84 mg/dL (ref 70–99)
Potassium: 3 mEq/L — ABNORMAL LOW (ref 3.5–5.1)
SODIUM: 138 meq/L (ref 135–145)

## 2014-09-01 LAB — CBC WITH DIFFERENTIAL/PLATELET
Basophils Absolute: 0 10*3/uL (ref 0.0–0.1)
Basophils Relative: 0.5 % (ref 0.0–3.0)
Eosinophils Absolute: 0.2 10*3/uL (ref 0.0–0.7)
Eosinophils Relative: 3 % (ref 0.0–5.0)
HEMATOCRIT: 43.2 % (ref 36.0–46.0)
Hemoglobin: 13.7 g/dL (ref 12.0–15.0)
Lymphocytes Relative: 34.4 % (ref 12.0–46.0)
Lymphs Abs: 2.2 10*3/uL (ref 0.7–4.0)
MCHC: 31.7 g/dL (ref 30.0–36.0)
MCV: 83.7 fl (ref 78.0–100.0)
MONOS PCT: 8.9 % (ref 3.0–12.0)
Monocytes Absolute: 0.6 10*3/uL (ref 0.1–1.0)
NEUTROS PCT: 53.2 % (ref 43.0–77.0)
Neutro Abs: 3.4 10*3/uL (ref 1.4–7.7)
PLATELETS: 223 10*3/uL (ref 150.0–400.0)
RBC: 5.16 Mil/uL — ABNORMAL HIGH (ref 3.87–5.11)
RDW: 14.1 % (ref 11.5–15.5)
WBC: 6.5 10*3/uL (ref 4.0–10.5)

## 2014-09-01 LAB — HEPATIC FUNCTION PANEL
ALT: 23 U/L (ref 0–35)
AST: 24 U/L (ref 0–37)
Albumin: 3.1 g/dL — ABNORMAL LOW (ref 3.5–5.2)
Alkaline Phosphatase: 68 U/L (ref 39–117)
BILIRUBIN TOTAL: 0.4 mg/dL (ref 0.2–1.2)
Bilirubin, Direct: 0 mg/dL (ref 0.0–0.3)
TOTAL PROTEIN: 7.5 g/dL (ref 6.0–8.3)

## 2014-09-01 LAB — HEMOGLOBIN A1C: HEMOGLOBIN A1C: 6.3 % (ref 4.6–6.5)

## 2014-09-01 LAB — TSH: TSH: 0.64 u[IU]/mL (ref 0.35–4.50)

## 2014-09-01 MED ORDER — LOSARTAN POTASSIUM 50 MG PO TABS: 50.0000 mg | ORAL_TABLET | Freq: Every day | ORAL | Status: DC

## 2014-09-01 NOTE — Progress Notes (Signed)
Pre visit review using our clinic review tool, if applicable. No additional management support is needed unless otherwise documented below in the visit note. 

## 2014-09-01 NOTE — Progress Notes (Signed)
   Subjective:    Patient ID: Rhonda Morrow, female    DOB: 1961/05/05, 53 y.o.   MRN: [id number]  HPI DM- new dx this year for pt.  On Metformin.  Pt has gained weight from last visit.  Pt reports she is not eating regularly due to work schedule.  Drinking soda rather than water.  Not taking her potassium pills regularly.  Intermittent nausea, no vomiting, rare diarrhea.  No CP, rare SOB.  No eye exam due to difficulty w/ interpreter.   Review of Systems For ROS see HPI     Objective:   Physical Exam  Vitals reviewed. Constitutional: She is oriented to person, place, and time. She appears well-developed and well-nourished. No distress.  HENT:  Head: Normocephalic and atraumatic.  Eyes: Conjunctivae and EOM are normal. Pupils are equal, round, and reactive to light.  Neck: Normal range of motion. Neck supple. No thyromegaly present.  Cardiovascular: Normal rate, regular rhythm, normal heart sounds and intact distal pulses.   No murmur heard. Pulmonary/Chest: Effort normal and breath sounds normal. No respiratory distress.  Abdominal: Soft. She exhibits no distension. There is no tenderness.  Musculoskeletal: She exhibits no edema.  Lymphadenopathy:    She has no cervical adenopathy.  Neurological: She is alert and oriented to person, place, and time.  Skin: Skin is warm and dry.  Psychiatric: She has a normal mood and affect. Her behavior is normal.          Assessment & Plan:

## 2014-09-01 NOTE — Assessment & Plan Note (Signed)
Ongoing issue for pt.  Tolerating metformin.  Add Losartan due to elevated BP and for renal protection.  Will again refer to ophthalmology and assist pt in making sure interpreter is available.  Check labs.  Adjust meds prn

## 2014-09-01 NOTE — Assessment & Plan Note (Signed)
Deteriorated.  BP elevated.  Add Losartan for better BP control and also for renal protection in the setting of DM.  Pt expressed understanding and is in agreement w/ plan.

## 2014-09-01 NOTE — Patient Instructions (Signed)
Schedule an appt for a vaginal exam at your convenience in the next 1-2 weeks We'll try and figure out the eye doctor issue so that you can go and have your exam Make sure you are eating regularly and avoiding sodas which is causing your blood sugar to drop low Start the Losartan daily- this is the BP med that will protect your kidneys We'll notify you of your lab results and make any changes if needed Call with any questions or concerns Happy Early Birthday!

## 2014-09-02 ENCOUNTER — Other Ambulatory Visit: Payer: Self-pay

## 2014-09-02 ENCOUNTER — Emergency Department (HOSPITAL_COMMUNITY): Payer: No Typology Code available for payment source

## 2014-09-02 ENCOUNTER — Encounter (HOSPITAL_COMMUNITY): Payer: Self-pay | Admitting: Emergency Medicine

## 2014-09-02 ENCOUNTER — Encounter: Payer: Self-pay | Admitting: General Practice

## 2014-09-02 ENCOUNTER — Emergency Department (HOSPITAL_COMMUNITY)
Admission: EM | Admit: 2014-09-02 | Discharge: 2014-09-02 | Disposition: A | Discharge status: Home / Self Care | Payer: No Typology Code available for payment source | Attending: Emergency Medicine | Admitting: Emergency Medicine

## 2014-09-02 DIAGNOSIS — Z9889 Other specified postprocedural states: Secondary | ICD-10-CM | POA: Diagnosis not present

## 2014-09-02 DIAGNOSIS — I10 Essential (primary) hypertension: Secondary | ICD-10-CM | POA: Insufficient documentation

## 2014-09-02 DIAGNOSIS — E119 Type 2 diabetes mellitus without complications: Secondary | ICD-10-CM | POA: Diagnosis not present

## 2014-09-02 DIAGNOSIS — H9190 Unspecified hearing loss, unspecified ear: Secondary | ICD-10-CM | POA: Diagnosis not present

## 2014-09-02 DIAGNOSIS — J45909 Unspecified asthma, uncomplicated: Secondary | ICD-10-CM | POA: Insufficient documentation

## 2014-09-02 DIAGNOSIS — Z7951 Long term (current) use of inhaled steroids: Secondary | ICD-10-CM | POA: Diagnosis not present

## 2014-09-02 DIAGNOSIS — Z1231 Encounter for screening mammogram for malignant neoplasm of breast: Secondary | ICD-10-CM

## 2014-09-02 DIAGNOSIS — R0789 Other chest pain: Secondary | ICD-10-CM

## 2014-09-02 DIAGNOSIS — Z79899 Other long term (current) drug therapy: Secondary | ICD-10-CM | POA: Insufficient documentation

## 2014-09-02 DIAGNOSIS — R079 Chest pain, unspecified: Secondary | ICD-10-CM | POA: Diagnosis present

## 2014-09-02 LAB — CBC
HCT: 40.3 % (ref 36.0–46.0)
Hemoglobin: 13.1 g/dL (ref 12.0–15.0)
MCH: 26.9 pg (ref 26.0–34.0)
MCHC: 32.5 g/dL (ref 30.0–36.0)
MCV: 82.8 fL (ref 78.0–100.0)
Platelets: 237 10*3/uL (ref 150–400)
RBC: 4.87 MIL/uL (ref 3.87–5.11)
RDW: 13.6 % (ref 11.5–15.5)
WBC: 7.2 10*3/uL (ref 4.0–10.5)

## 2014-09-02 LAB — BASIC METABOLIC PANEL
Anion gap: 14 (ref 5–15)
BUN: 11 mg/dL (ref 6–23)
CALCIUM: 9 mg/dL (ref 8.4–10.5)
CO2: 26 mEq/L (ref 19–32)
Chloride: 100 mEq/L (ref 96–112)
Creatinine, Ser: 0.67 mg/dL (ref 0.50–1.10)
GFR calc Af Amer: >90 mL/min (ref >90)
GFR calc non Af Amer: >90 mL/min (ref >90)
GLUCOSE: 134 mg/dL — AB (ref 70–99)
Potassium: 4.3 mEq/L (ref 3.7–5.3)
Sodium: 140 mEq/L (ref 137–147)

## 2014-09-02 LAB — D-DIMER, QUANTITATIVE (NOT AT ARMC)

## 2014-09-02 LAB — I-STAT TROPONIN, ED
TROPONIN I, POC: 0 ng/mL (ref 0.00–0.08)
Troponin i, poc: 0.01 ng/mL (ref 0.00–0.08)
Troponin i, poc: 0 ng/mL (ref 0.00–0.08)

## 2014-09-02 LAB — PRO B NATRIURETIC PEPTIDE: PRO B NATRI PEPTIDE: 88.4 pg/mL (ref 0–125)

## 2014-09-02 MED ORDER — DIPHENHYDRAMINE HCL 50 MG/ML IJ SOLN
25.0000 mg | Freq: Once | INTRAMUSCULAR | Status: AC
  Administered 2014-09-02: 25 mg via INTRAVENOUS
  Filled 2014-09-02: qty 1

## 2014-09-02 MED ORDER — METOCLOPRAMIDE HCL 5 MG/ML IJ SOLN
10.0000 mg | Freq: Once | INTRAMUSCULAR | Status: AC
  Administered 2014-09-02: 10 mg via INTRAVENOUS
  Filled 2014-09-02: qty 2

## 2014-09-02 MED ORDER — FENTANYL CITRATE 0.05 MG/ML IJ SOLN
100.0000 ug | Freq: Once | INTRAMUSCULAR | Status: AC
  Administered 2014-09-02: 100 ug via INTRAVENOUS
  Filled 2014-09-02: qty 2

## 2014-09-02 NOTE — ED Notes (Signed)
Gave pt saltines and peanut butter

## 2014-09-02 NOTE — Discharge Instructions (Signed)

## 2014-09-02 NOTE — ED Provider Notes (Signed)
CSN: [id number]     Arrival date & time 09/02/14  0224 History   First MD Initiated Contact with Patient 09/02/14 0241     Chief Complaint  Patient presents with  . Chest Pain     (Consider location/radiation/quality/duration/timing/severity/associated sxs/prior Treatment) HPI 53 year old female with history of hypertension, asthma, diabetes, presents with 1-2 hours of vague mild diffuse nonradiating chest pressure with shortness of breath with no fever no cough no treatment prior to arrival other than aspirin, she has no sharp stabbing pain or sudden pain, her symptoms were gradual onset constant, she also has gradual onset constant initially mild but now moderately severe pressure-like global headache with no sudden headache no change in speech or vision no change in understanding no lateralizing weakness numbness or incoordination.  Past Medical History  Diagnosis Date  . Hypertension   . Deaf   . Asthma   . Thyroid disease   . Diabetes mellitus without complication    Past Surgical History  Procedure Laterality Date  . Cesarean section    . Cardiac catheterization    . Esophageal manometry N/A 09/29/2013    Procedure: ESOPHAGEAL MANOMETRY (EM);  Surgeon: Milus Banister, MD;  Location: [facility];  Service: Endoscopy;  Laterality: N/A;   Family History  Problem Relation Age of Onset  . Diabetes Mother   . Diabetes Father   . Heart disease Mother    History  Substance Use Topics  . Smoking status: Never Smoker   . Smokeless tobacco: Never Used  . Alcohol Use: No   OB History    No data available     Review of Systems 10 Systems reviewed and are negative for acute change except as noted in the HPI.   Allergies  Augmentin and Oxycodone-acetaminophen  Home Medications   Prior to Admission medications   Medication Sig Start Date End Date Taking? Authorizing Provider  albuterol (PROVENTIL HFA;VENTOLIN HFA) 108 (90 BASE) MCG/ACT inhaler Inhale 2 puffs into the  lungs every 6 (six) hours as needed for wheezing. 09/18/13  Yes Brand Males, MD  cetirizine (ZYRTEC) 10 MG tablet Take 1 tablet (10 mg total) by mouth daily. 04/09/14  Yes Midge Minium, MD  cyclobenzaprine (FLEXERIL) 10 MG tablet Take 1 tablet (10 mg total) by mouth 3 (three) times daily as needed. For pain 11/12/13  Yes Midge Minium, MD  famotidine (PEPCID) 20 MG tablet Take 20 mg by mouth at bedtime.   Yes Historical Provider, MD  Fiber CHEW Chew 1 tablet by mouth daily.   Yes Historical Provider, MD  fluticasone (FLONASE) 50 MCG/ACT nasal spray Place 2 sprays into both nostrils daily. 04/09/14  Yes Midge Minium, MD  hydrochlorothiazide (HYDRODIURIL) 12.5 MG tablet Take 1 tablet (12.5 mg total) by mouth daily. 02/16/14  Yes Midge Minium, MD  hydrOXYzine (ATARAX/VISTARIL) 25 MG tablet Take 1 tablet (25 mg total) by mouth 3 (three) times daily as needed. 05/06/14  Yes Midge Minium, MD  ibuprofen (ADVIL,MOTRIN) 800 MG tablet Take 1 tablet (800 mg total) by mouth every 8 (eight) hours as needed for pain. 08/05/13  Yes Midge Minium, MD  meclizine (ANTIVERT) 25 MG tablet Take 1 tablet (25 mg total) by mouth 3 (three) times daily as needed. 01/30/13  Yes Midge Minium, MD  metFORMIN (GLUCOPHAGE) 500 MG tablet Take 1 tablet (500 mg total) by mouth 2 (two) times daily with a meal. 08/12/14  Yes Midge Minium, MD  mometasone-formoterol (DULERA) 100-5 MCG/ACT AERO  Inhale 2 puffs into the lungs 2 (two) times daily. 12/01/13  Yes Brand Males, MD  Multiple Vitamin (MULTIVITAMIN) tablet Take 1 tablet by mouth daily.   Yes Historical Provider, MD  naproxen sodium (ANAPROX) 220 MG tablet Take 220 mg by mouth every 6 (six) hours as needed.   Yes Historical Provider, MD  pantoprazole (PROTONIX) 40 MG tablet Take 1 tablet (40 mg total) by mouth daily. 08/18/14  Yes Midge Minium, MD  potassium chloride SA (K-DUR,KLOR-CON) 20 MEQ tablet Take 20 mEq by mouth daily.   Yes  Historical Provider, MD  traMADol (ULTRAM) 50 MG tablet Take 1 tablet (50 mg total) by mouth every 8 (eight) hours as needed. 02/16/14  Yes Midge Minium, MD  lisinopril (PRINIVIL,ZESTRIL) 10 MG tablet Take 1 tablet (10 mg total) by mouth daily. 09/03/14   Midge Minium, MD   BP 115/53 mmHg  Pulse 70  Temp(Src) 98.1 F (36.7 C) (Oral)  Resp 15  Ht 5\' 1"  (1.549 m)  Wt 235 lb (106.595 kg)  BMI 44.43 kg/m2  SpO2 100% Physical Exam  Nursing note and vitals reviewed. Constitutional:  Awake, alert, nontoxic appearance with baseline speech for patient.  HENT:  Head: Atraumatic.  Mouth/Throat: No oropharyngeal exudate.  Eyes: EOM are normal. Pupils are equal, round, and reactive to light. Right eye exhibits no discharge. Left eye exhibits no discharge.  Neck: Neck supple.  Cardiovascular: Normal rate and regular rhythm.   No murmur heard. Pulmonary/Chest: Effort normal and breath sounds normal. No stridor. No respiratory distress. She has no wheezes. She has no rales. She exhibits no tenderness.  Abdominal: Soft. Bowel sounds are normal. She exhibits no mass. There is no tenderness. There is no rebound.  Musculoskeletal: She exhibits no tenderness.  Baseline ROM, moves extremities with no obvious new focal weakness.  Lymphadenopathy:    She has no cervical adenopathy.  Neurological: She is alert.  Awake, alert, cooperative and aware of situation; motor strength 5/5 bilaterally; sensation normal to light touch bilaterally; peripheral visual fields full to confrontation; no facial asymmetry; tongue midline; major cranial nerves appear intact; no pronator drift, normal finger to nose bilaterally, baseline gait without new ataxia. Patient with baseline deafness communicates through an interpreter.  Skin: No rash noted.  Psychiatric: She has a normal mood and affect.    ED Course  Procedures (including critical care time) HEART score 2. Low risk Wells PE PTP and d-dimer  normal. Labs Review Labs Reviewed  BASIC METABOLIC PANEL - Abnormal; Notable for the following:    Glucose, Bld 134 (*)    All other components within normal limits  CBC  PRO B NATRIURETIC PEPTIDE  D-DIMER, QUANTITATIVE  I-STAT TROPOININ, ED  I-STAT TROPOININ, ED  I-STAT TROPOININ, ED    Imaging Review No results found.   EKG Interpretation   Date/Time:  Wednesday September 02 2014 02:32:04 EDT Ventricular Rate:  72 PR Interval:  156 QRS Duration: 105 QT Interval:  400 QTC Calculation: 438 R Axis:   -26 Text Interpretation:  Sinus rhythm Borderline left axis deviation Low  voltage, precordial leads RSR' in V1 or V2, probably normal variant No  significant change since last tracing Confirmed by [facility]  MD, Jenny Reichmann  [phone]) on 09/02/2014 2:42:40 AM      MDM   Final diagnoses:  Atypical chest pain    Dispo pending. Hand-off performed.    Babette Relic, MD 09/06/14 [phone]

## 2014-09-02 NOTE — ED Provider Notes (Signed)
9:20 AM history is obtained from interpreter for the deaf patient asymptomatic pain-free she was sleeping, easily arousable in no distress.. Patient reportedly complained of vague chest discomfort and shortness of breath. In light of atypical story and negative serial troponins I feel that she can be further evaluated as outpatient diagnosis  Results for orders placed during the hospital encounter of 09/02/14  CBC      Result Value Ref Range   WBC 7.2  4.0 - 10.5 K/uL   RBC 4.87  3.87 - 5.11 MIL/uL   Hemoglobin 13.1  12.0 - 15.0 g/dL   HCT 40.3  36.0 - 46.0 %   MCV 82.8  78.0 - 100.0 fL   MCH 26.9  26.0 - 34.0 pg   MCHC 32.5  30.0 - 36.0 g/dL   RDW 13.6  11.5 - 15.5 %   Platelets 237  150 - 400 K/uL  BASIC METABOLIC PANEL      Result Value Ref Range   Sodium 140  137 - 147 mEq/L   Potassium 4.3  3.7 - 5.3 mEq/L   Chloride 100  96 - 112 mEq/L   CO2 26  19 - 32 mEq/L   Glucose, Bld 134 (*) 70 - 99 mg/dL   BUN 11  6 - 23 mg/dL   Creatinine, Ser 0.67  0.50 - 1.10 mg/dL   Calcium 9.0  8.4 - 10.5 mg/dL   GFR calc non Af Amer >90  >90 mL/min   GFR calc Af Amer >90  >90 mL/min   Anion gap 14  5 - 15  PRO B NATRIURETIC PEPTIDE      Result Value Ref Range   Pro B Natriuretic peptide (BNP) 88.4  0 - 125 pg/mL  D-DIMER, QUANTITATIVE      Result Value Ref Range   D-Dimer, Quant <0.27  0.00 - 0.48 ug/mL-FEU  I-STAT TROPOININ, ED      Result Value Ref Range   Troponin i, poc 0.01  0.00 - 0.08 ng/mL   Comment 3           I-STAT TROPOININ, ED      Result Value Ref Range   Troponin i, poc 0.00  0.00 - 0.08 ng/mL   Comment 3           I-STAT TROPOININ, ED      Result Value Ref Range   Troponin i, poc 0.00  0.00 - 0.08 ng/mL   Comment 3            Dg Chest Port 1 View  09/02/2014   CLINICAL DATA:  Chest pain  EXAM: PORTABLE CHEST - 1 VIEW  COMPARISON:  08/15/2013  FINDINGS: Hypoaeration with interstitial and vascular crowding. Enlarged cardiac silhouette. Mediastinal contours otherwise  within normal range. No definite pleural effusion. No pneumothorax. No acute osseous finding.  IMPRESSION: Enlarged cardiac silhouette.  Hypoaeration with interstitial and vascular crowding.   Electronically Signed   By: Carlos Levering M.D.   On: 09/02/2014 03:11     Orlie Dakin, MD 09/02/14 [phone]

## 2014-09-02 NOTE — ED Notes (Signed)
Pt arrives via EMS, c/o centralized chest pain and SHOB. Hx of asthma. Initial BP 200/120. EMS unable to get EKG. 324 ASA given PTA.

## 2014-09-02 NOTE — ED Notes (Signed)
Interpreter at bedside MD at bedside

## 2014-09-03 ENCOUNTER — Ambulatory Visit (INDEPENDENT_AMBULATORY_CARE_PROVIDER_SITE_OTHER): Payer: No Typology Code available for payment source | Admitting: Family Medicine

## 2014-09-03 ENCOUNTER — Encounter: Payer: Self-pay | Admitting: Family Medicine

## 2014-09-03 ENCOUNTER — Telehealth: Payer: Self-pay

## 2014-09-03 VITALS — BP 150/84 | HR 77 | Temp 98.1°F | Resp 16 | Wt 232.1 lb

## 2014-09-03 DIAGNOSIS — T50905A Adverse effect of unspecified drugs, medicaments and biological substances, initial encounter: Secondary | ICD-10-CM

## 2014-09-03 DIAGNOSIS — I10 Essential (primary) hypertension: Secondary | ICD-10-CM

## 2014-09-03 DIAGNOSIS — T887XXA Unspecified adverse effect of drug or medicament, initial encounter: Secondary | ICD-10-CM

## 2014-09-03 MED ORDER — LISINOPRIL 10 MG PO TABS: 10.0000 mg | ORAL_TABLET | Freq: Every day | ORAL | Status: DC

## 2014-09-03 NOTE — Assessment & Plan Note (Signed)
Chronic problem.  Pt was unable to tolerate 1st dose of Losartan as it caused 'terrible' HA.  Does need additional BP control and due to diabetes, will attempt ACE.  Reviewed supportive care and red flags that should prompt return.  Pt expressed understanding and is in agreement w/ plan.

## 2014-09-03 NOTE — Progress Notes (Signed)
Pre visit review using our clinic review tool, if applicable. No additional management support is needed unless otherwise documented below in the visit note. 

## 2014-09-03 NOTE — Patient Instructions (Signed)
Follow up as scheduled STOP the Losartan- throw it away START the Lisinopril daily in the morning for blood pressure Continue the HCTZ Drink plenty of water If you again develop headache, please call here to let me know.  If you have chest pain or shortness of breath, please go to the ER Call with any questions or concerns I'm so glad that you are feeling better!

## 2014-09-03 NOTE — Progress Notes (Signed)
   Subjective:    Patient ID: Rhonda Morrow, female    DOB: 1961/02/14, 53 y.o.   MRN: [id number]  HPI ER f/u- went to ER on 10/28 for HA, SOB.  Took Losartan at dinner and 1 hr later developed severe HA.  Took Aleve and HA worsened, then developed SOB- in what sounds like a panic attack.  No swelling of face or tongue.  EMS arrived and pt's BP was elevated over 200 (200/120).  Went to ER and had pain meds for HA, medicine for BP.  EKG and labs were normal.  Pt was given migraine cocktail.  Today is HA free, chest pain free, and no SOB.   Review of Systems For ROS see HPI     Objective:   Physical Exam  Vitals reviewed. Constitutional: She is oriented to person, place, and time. She appears well-developed and well-nourished. No distress.  HENT:  Head: Normocephalic and atraumatic.  Eyes: Conjunctivae and EOM are normal. Pupils are equal, round, and reactive to light.  Neck: Normal range of motion. Neck supple. No thyromegaly present.  Cardiovascular: Normal rate, regular rhythm, normal heart sounds and intact distal pulses.   No murmur heard. Pulmonary/Chest: Effort normal and breath sounds normal. No respiratory distress.  Abdominal: Soft. She exhibits no distension. There is no tenderness.  Musculoskeletal: She exhibits no edema.  Lymphadenopathy:    She has no cervical adenopathy.  Neurological: She is alert and oriented to person, place, and time.  Skin: Skin is warm and dry.  Psychiatric: She has a normal mood and affect. Her behavior is normal.          Assessment & Plan:

## 2014-09-03 NOTE — Assessment & Plan Note (Signed)
Pt developed sudden and severe HA 1 hr after taking 1st dose of Losartan.  Pt had what sounds to be a subsequent panic attack which resulted in SOB, high BP, and EMS call w/ ER visit.  Pt's EKG was unchanged and labs were normal.  D/C losartan and start Lisinopril w/ strict return instructions for side effects.  Pt expressed understanding and is in agreement w/ plan.

## 2014-09-03 NOTE — Telephone Encounter (Signed)
Work note provided for patient

## 2014-09-04 ENCOUNTER — Telehealth: Payer: Self-pay | Admitting: Family Medicine

## 2014-09-04 NOTE — Telephone Encounter (Signed)
Caller name: [employer] planner)    Reason for call:  Pt called in stating she was dizzy, about to pass out, and confused.  Tried to reach [facility]; they did not answer.  Rn advised pt to go to ED and call 911.  Pt had already hung up and stated she was going to do so.

## 2014-09-14 ENCOUNTER — Ambulatory Visit (INDEPENDENT_AMBULATORY_CARE_PROVIDER_SITE_OTHER): Payer: No Typology Code available for payment source | Admitting: Family Medicine

## 2014-09-14 ENCOUNTER — Encounter: Payer: Self-pay | Admitting: Family Medicine

## 2014-09-14 ENCOUNTER — Ambulatory Visit (INDEPENDENT_AMBULATORY_CARE_PROVIDER_SITE_OTHER): Payer: No Typology Code available for payment source | Admitting: Internal Medicine

## 2014-09-14 ENCOUNTER — Encounter: Payer: Self-pay | Admitting: Internal Medicine

## 2014-09-14 ENCOUNTER — Other Ambulatory Visit (HOSPITAL_COMMUNITY)
Admission: RE | Admit: 2014-09-14 | Discharge: 2014-09-14 | Disposition: A | Discharge status: Home / Self Care | Payer: No Typology Code available for payment source | Source: Ambulatory Visit | Attending: Family Medicine | Admitting: Family Medicine

## 2014-09-14 ENCOUNTER — Telehealth: Payer: Self-pay | Admitting: Family Medicine

## 2014-09-14 VITALS — BP 148/96 | HR 82 | Ht 64.0 in | Wt 232.0 lb

## 2014-09-14 VITALS — BP 130/86 | HR 82 | Temp 98.1°F | Resp 16 | Wt 229.5 lb

## 2014-09-14 DIAGNOSIS — Z124 Encounter for screening for malignant neoplasm of cervix: Secondary | ICD-10-CM

## 2014-09-14 DIAGNOSIS — N760 Acute vaginitis: Secondary | ICD-10-CM | POA: Diagnosis present

## 2014-09-14 DIAGNOSIS — N898 Other specified noninflammatory disorders of vagina: Secondary | ICD-10-CM | POA: Insufficient documentation

## 2014-09-14 DIAGNOSIS — Z01419 Encounter for gynecological examination (general) (routine) without abnormal findings: Secondary | ICD-10-CM | POA: Diagnosis present

## 2014-09-14 DIAGNOSIS — J4540 Moderate persistent asthma, uncomplicated: Secondary | ICD-10-CM

## 2014-09-14 DIAGNOSIS — Z1151 Encounter for screening for human papillomavirus (HPV): Secondary | ICD-10-CM | POA: Diagnosis present

## 2014-09-14 NOTE — Telephone Encounter (Signed)
That is good. Thanks

## 2014-09-14 NOTE — Patient Instructions (Addendum)
#  ASTHMA Asthma appears well controlled Continue your asthma maintenance inhaler 2 puiff twice daily; will have [facility] Continue on Protonix daily  I have written to PCP Annye Asa, MD to get you off linsinopril that can make asthma worse  .  follow up  - Tammy Parrett in 6 months and As needed    - Please contact office for sooner follow up if symptoms do not improve or worsen or seek emergency care

## 2014-09-14 NOTE — Telephone Encounter (Signed)
Hi Belenda Cruise  Woundered if there is alternative for her ACE inhibitor due to potential for ace inhibitor to worsen airway inflammation?  THanks  Dr. Brand Males, M.D., [facility].C.P Pulmonary and Critical Care Medicine Staff Physician [place] [facility] Pager: [phone], If no answer or between  15:00h - 7:00h: call [phone]  09/14/2014 11:36 AM

## 2014-09-14 NOTE — Progress Notes (Signed)
   Subjective:    Patient ID: Rhonda Morrow, female    DOB: [birth year], 53 y.o.   MRN: [id number]  HPI Vaginal bump- noted on labia, felt 2 weeks ago.  Scant yellow vaginal discharge.  + itching.  Intermittent burning w/ urination.  Pt thinks pap was 2 yrs ago.   Review of Systems For ROS see HPI     Objective:   Physical Exam  Constitutional: She appears well-developed and well-nourished. No distress.  Genitourinary: Rectal exam shows no external hemorrhoid. There is no rash, tenderness, lesion or injury on the right labia. There is no rash, tenderness, lesion or injury on the left labia. Uterus is not enlarged, not fixed and not tender. Cervix exhibits no motion tenderness, no discharge and no friability. Right adnexum displays no mass, no tenderness and no fullness. Left adnexum displays no mass, no tenderness and no fullness. No erythema, tenderness or bleeding in the vagina. No foreign body around the vagina. Vaginal discharge found.  No 'bump' appreciated on PE and pt was unable to locate area of concern + vaginal d/c w/ odor consistent w/ BV Pap collected  Vitals reviewed.         Assessment & Plan:

## 2014-09-14 NOTE — Progress Notes (Signed)
Subjective:    Patient ID: Rhonda Morrow, female    DOB: [birth year], 53 y.o.   MRN: [id number]  HPI    OV 09/14/2014  Chief Complaint  Patient presents with  . Follow-up    Pt c/o DOE, mild non prod cough. Pt denies CP/tightness.    Follow-up asthma and obese deaf-mute individual. She presents with an interpreter.  - She reports compliance with asthma inhaler 2 puff 2 times daily. She does not know the name. Last visit we changed it to Symbicort but I see that she is back on dulera . She says that is a problem at [facility] although she is unable to specify what exactly this. Nevertheless asthma stable. Albuterol use is rare. Her main symptoms appear to be exertion related which is relieved by rest and using albuterol for rescue.  - Past medical history reviewed: She was recently in the emergency room for a panic attack. It appears that her primary care physician since then is change antihypertensive to lisinopril and she is feeling better . Though lisinopril is in acE inhibitor and can cause cough  Immunization History  Administered Date(s) Administered  . DT 04/07/2011  . Influenza Split 08/07/2011, 08/05/2012, 08/22/2012, 08/31/2014  . Influenza,inj,Quad PF,36+ Mos 08/20/2013  . Pneumococcal Polysaccharide-23 10/02/2012     Review of Systems  Constitutional: Negative for fever and unexpected weight change.  HENT: Negative for congestion, dental problem, ear pain, nosebleeds, postnasal drip, rhinorrhea, sinus pressure, sneezing, sore throat and trouble swallowing.   Eyes: Negative for redness and itching.  Respiratory: Positive for cough and shortness of breath. Negative for chest tightness and wheezing.   Cardiovascular: Negative for palpitations and leg swelling.  Gastrointestinal: Negative for nausea and vomiting.  Genitourinary: Negative for dysuria.  Musculoskeletal: Negative for joint swelling.  Skin: Negative for rash.  Neurological: Negative for headaches.  Hematological:  Does not bruise/bleed easily.  Psychiatric/Behavioral: Negative for dysphoric mood. The patient is not nervous/anxious.    Current outpatient prescriptions: albuterol (PROVENTIL HFA;VENTOLIN HFA) 108 (90 BASE) MCG/ACT inhaler, Inhale 2 puffs into the lungs every 6 (six) hours as needed for wheezing., Disp: 1 Inhaler, Rfl: 6;  cetirizine (ZYRTEC) 10 MG tablet, Take 1 tablet (10 mg total) by mouth daily., Disp: 30 tablet, Rfl: 11 cyclobenzaprine (FLEXERIL) 10 MG tablet, Take 1 tablet (10 mg total) by mouth 3 (three) times daily as needed. For pain, Disp: 45 tablet, Rfl: 3;  famotidine (PEPCID) 20 MG tablet, Take 20 mg by mouth at bedtime., Disp: , Rfl: ;  Fiber CHEW, Chew 1 tablet by mouth daily., Disp: , Rfl: ;  fluticasone (FLONASE) 50 MCG/ACT nasal spray, Place 2 sprays into both nostrils daily., Disp: 16 g, Rfl: 6 hydrochlorothiazide (HYDRODIURIL) 12.5 MG tablet, Take 1 tablet (12.5 mg total) by mouth daily., Disp: 90 tablet, Rfl: 3;  hydrOXYzine (ATARAX/VISTARIL) 25 MG tablet, Take 1 tablet (25 mg total) by mouth 3 (three) times daily as needed., Disp: 60 tablet, Rfl: 0;  ibuprofen (ADVIL,MOTRIN) 800 MG tablet, Take 1 tablet (800 mg total) by mouth every 8 (eight) hours as needed for pain., Disp: 21 tablet, Rfl: 0 lisinopril (PRINIVIL,ZESTRIL) 10 MG tablet, Take 1 tablet (10 mg total) by mouth daily., Disp: 30 tablet, Rfl: 3;  meclizine (ANTIVERT) 25 MG tablet, Take 1 tablet (25 mg total) by mouth 3 (three) times daily as needed., Disp: 45 tablet, Rfl: 3;  metFORMIN (GLUCOPHAGE) 500 MG tablet, Take 1 tablet (500 mg total) by mouth 2 (two) times daily with a meal.,  Disp: 180 tablet, Rfl: 1 mometasone-formoterol (DULERA) 100-5 MCG/ACT AERO, Inhale 2 puffs into the lungs 2 (two) times daily., Disp: 1 Inhaler, Rfl: 5;  Multiple Vitamin (MULTIVITAMIN) tablet, Take 1 tablet by mouth daily., Disp: , Rfl: ;  naproxen sodium (ANAPROX) 220 MG tablet, Take 220 mg by mouth every 6 (six) hours as needed., Disp: , Rfl:  ;  pantoprazole (PROTONIX) 40 MG tablet, Take 1 tablet (40 mg total) by mouth daily., Disp: 90 tablet, Rfl: 1 potassium chloride SA (K-DUR,KLOR-CON) 20 MEQ tablet, Take 20 mEq by mouth daily., Disp: , Rfl: ;  traMADol (ULTRAM) 50 MG tablet, Take 1 tablet (50 mg total) by mouth every 8 (eight) hours as needed., Disp: 60 tablet, Rfl: 0     Objective:   Physical Exam  Constitutional: She is oriented to person, place, and time. She appears well-developed and well-nourished. No distress.  Body mass index is 39.8 kg/(m^2).   HENT:  Head: Normocephalic and atraumatic.  Right Ear: External ear normal.  Left Ear: External ear normal.  Mouth/Throat: Oropharynx is clear and moist. No oropharyngeal exudate.  Deaf and mute  Eyes: Conjunctivae and EOM are normal. Pupils are equal, round, and reactive to light. Right eye exhibits no discharge. Left eye exhibits no discharge. No scleral icterus.  Neck: Normal range of motion. Neck supple. No JVD present. No tracheal deviation present. No thyromegaly present.  Cardiovascular: Normal rate, regular rhythm, normal heart sounds and intact distal pulses.  Exam reveals no gallop and no friction rub.   No murmur heard. Pulmonary/Chest: Effort normal and breath sounds normal. No respiratory distress. She has no wheezes. She has no rales. She exhibits no tenderness.  Abdominal: Soft. Bowel sounds are normal. She exhibits no distension and no mass. There is no tenderness. There is no rebound and no guarding.  Musculoskeletal: Normal range of motion. She exhibits no edema or tenderness.  Lymphadenopathy:    She has no cervical adenopathy.  Neurological: She is alert and oriented to person, place, and time. She has normal reflexes. No cranial nerve deficit. She exhibits normal muscle tone. Coordination normal.  Skin: Skin is warm and dry. No rash noted. She is not diaphoretic. No erythema. No pallor.  Psychiatric: She has a normal mood and affect. Her behavior is  normal. Judgment and thought content normal.  Vitals reviewed.    Filed Vitals:   09/14/14 1114  BP: 148/96  Pulse: 82  Height: 5\' 4"  (1.626 m)  Weight: 232 lb (105.235 kg)  SpO2: 98%        Assessment & Plan:  #ASTHMA Asthma appears well controlled Continue your asthma maintenance inhaler 2 puiff twice daily; will have mynurse investigate [facility] is Continue on Protonix daily  I have written to PCP Annye Asa, MD to get you off linsinopril that can make asthma worse  .  follow up  - Tammy Parrett in 6 months and As needed    - Please contact office for sooner follow up if symptoms do not improve or worsen or seek emergency care      Dr. Brand Males, M.D., [facility] Pulmonary and Critical Care Medicine Staff Physician [facility] Pager: [phone], If no answer or between  15:00h - 7:00h: call [phone]  09/14/2014 11:34 AM

## 2014-09-14 NOTE — Telephone Encounter (Signed)
Scheduled 30 minute follow up in 2 acute slots for 10/26/14 at 2pm. Is this ok

## 2014-09-14 NOTE — Progress Notes (Signed)
Pre visit review using our clinic review tool, if applicable. No additional management support is needed unless otherwise documented below in the visit note. 

## 2014-09-14 NOTE — Patient Instructions (Signed)
Follow up in 4-6 weeks to recheck BP STOP the Lisinopril We'll notify you of your lab results and treat any yeast or BV if needed Call with any questions or concerns [place]!!!

## 2014-09-15 ENCOUNTER — Other Ambulatory Visit: Payer: Self-pay | Admitting: General Practice

## 2014-09-15 MED ORDER — METFORMIN HCL 500 MG PO TABS: 500.0000 mg | ORAL_TABLET | Freq: Two times a day (BID) | ORAL | Status: DC

## 2014-09-15 NOTE — Assessment & Plan Note (Signed)
Pap collected. 

## 2014-09-15 NOTE — Assessment & Plan Note (Signed)
New.  Wet prep collected due to vaginal d/c and odor consistent w/ BV.  Pt is sensitive to medications so rather than start meds w/o definitive dx will await results and tx prn.  Pt expressed understanding and is in agreement w/ plan.

## 2014-09-16 ENCOUNTER — Ambulatory Visit: Payer: Self-pay | Admitting: Family Medicine

## 2014-09-16 LAB — CERVICOVAGINAL ANCILLARY ONLY
WET PREP (BD AFFIRM): NEGATIVE
Wet Prep (BD Affirm): NEGATIVE
Wet Prep (BD Affirm): NEGATIVE

## 2014-09-17 LAB — CYTOLOGY - PAP

## 2014-09-18 ENCOUNTER — Telehealth: Payer: Self-pay | Admitting: Family Medicine

## 2014-09-18 MED ORDER — METFORMIN HCL 500 MG PO TABS: 500.0000 mg | ORAL_TABLET | Freq: Two times a day (BID) | ORAL | Status: DC

## 2014-09-18 NOTE — Telephone Encounter (Signed)
Med filled.  

## 2014-09-18 NOTE — Telephone Encounter (Signed)
Caller name: Odie, Rauen Relation to pt: interpreter  Call back number: [phone] [facility] [phone]  Reason for call:   Pt states pharmacy has never received metFORMIN (GLUCOPHAGE) 500 MG tablet, pt states she completley out.

## 2014-09-21 ENCOUNTER — Ambulatory Visit
Admission: RE | Admit: 2014-09-21 | Discharge: 2014-09-21 | Disposition: A | Discharge status: Home / Self Care | Payer: No Typology Code available for payment source | Source: Ambulatory Visit

## 2014-09-21 DIAGNOSIS — Z1231 Encounter for screening mammogram for malignant neoplasm of breast: Secondary | ICD-10-CM

## 2014-09-22 ENCOUNTER — Telehealth: Payer: Self-pay | Admitting: Family Medicine

## 2014-09-22 DIAGNOSIS — Z1239 Encounter for other screening for malignant neoplasm of breast: Secondary | ICD-10-CM

## 2014-09-22 MED ORDER — METFORMIN HCL 500 MG PO TABS: 500.0000 mg | ORAL_TABLET | Freq: Two times a day (BID) | ORAL | Status: DC

## 2014-09-22 NOTE — Telephone Encounter (Signed)
Patient calling stating, she is having pain in her left breast. Is requesting a diagnostic mammogram.

## 2014-09-22 NOTE — Telephone Encounter (Signed)
Pt last seen 09/03/14 and 09/08/14. Pt made no mention of pain at these visits. Due to pt being seen for physical and Gyn this year. Pt is due for mommo appt. Orders placed per protocol and provider verbal.

## 2014-09-22 NOTE — Telephone Encounter (Signed)
Med filled.  

## 2014-09-22 NOTE — Telephone Encounter (Signed)
Pt states her med refill on metformin (GLUCOPHAGE) 500 MG tablet was sent to wrong pharmacy, states [facility]

## 2014-09-24 ENCOUNTER — Telehealth: Payer: Self-pay | Admitting: *Deleted

## 2014-09-24 ENCOUNTER — Encounter: Payer: Self-pay | Admitting: Family Medicine

## 2014-09-24 ENCOUNTER — Ambulatory Visit: Payer: No Typology Code available for payment source | Admitting: Family Medicine

## 2014-09-24 MED ORDER — CYCLOBENZAPRINE HCL 10 MG PO TABS: 10.0000 mg | ORAL_TABLET | Freq: Three times a day (TID) | ORAL | Status: DC | PRN

## 2014-09-24 NOTE — Telephone Encounter (Signed)
Med filled.  

## 2014-09-24 NOTE — Telephone Encounter (Signed)
Caller name: Quetzal Relation to pt:  self Call back number: [phone]  [facility]  Reason for call: While pt was here this morning, she said she needed prescriptions below:  cetirizine (ZYRTEC) 10 MG tablet I called and left a message letting her know this prescriptions was written 04/09/14 with 11 refills [facility] [address].  metFORMIN (GLUCOPHAGE) 500 MG tablet I called and left a message letting her know this [facility] 09/22/2014  cyclobenzaprine (FLEXERIL) 10 MG tablet Last written 11/12/2013, #45, 3 refills Last OV 09/14/2014

## 2014-09-28 ENCOUNTER — Other Ambulatory Visit: Payer: Self-pay | Admitting: Family Medicine

## 2014-09-28 DIAGNOSIS — Z1239 Encounter for other screening for malignant neoplasm of breast: Secondary | ICD-10-CM

## 2014-10-06 LAB — HM DIABETES EYE EXAM

## 2014-10-13 ENCOUNTER — Encounter: Payer: Self-pay | Admitting: General Practice

## 2014-10-15 ENCOUNTER — Ambulatory Visit
Admission: RE | Admit: 2014-10-15 | Discharge: 2014-10-15 | Disposition: A | Discharge status: Home / Self Care | Payer: No Typology Code available for payment source | Source: Ambulatory Visit | Attending: Family Medicine | Admitting: Family Medicine

## 2014-10-15 DIAGNOSIS — Z1239 Encounter for other screening for malignant neoplasm of breast: Secondary | ICD-10-CM

## 2014-10-26 ENCOUNTER — Encounter: Payer: Self-pay | Admitting: Family Medicine

## 2014-10-26 ENCOUNTER — Ambulatory Visit (INDEPENDENT_AMBULATORY_CARE_PROVIDER_SITE_OTHER): Payer: No Typology Code available for payment source | Admitting: Family Medicine

## 2014-10-26 VITALS — BP 150/90 | HR 79 | Temp 97.7°F | Resp 16 | Wt 231.5 lb

## 2014-10-26 DIAGNOSIS — I10 Essential (primary) hypertension: Secondary | ICD-10-CM

## 2014-10-26 MED ORDER — AMLODIPINE BESYLATE 5 MG PO TABS: 5.0000 mg | ORAL_TABLET | Freq: Every day | ORAL | Status: DC

## 2014-10-26 NOTE — Progress Notes (Signed)
Pre visit review using our clinic review tool, if applicable. No additional management support is needed unless otherwise documented below in the visit note. 

## 2014-10-26 NOTE — Patient Instructions (Signed)
Follow up in late January or early February for the diabetes Continue the Hydrochlorothiazide (water pill) daily Add the Amlodipine daily (new blood pressure medicine) Call with any questions or concerns Happy Holidays!!!

## 2014-10-26 NOTE — Progress Notes (Signed)
   Subjective:    Patient ID: Rhonda Morrow, female    DOB: 11/02/1961, 53 y.o.   MRN: [id number]  HPI HTN- chronic problem, BP is elevated today.  Pt feels it may be due to sausage for breakfast.  No CP, some SOB w/ exertion (no more than usual), HAs, visual changes, edema.  Currently on HCTZ.  Had bad reaction to ARB.   Review of Systems For ROS see HPI     Objective:   Physical Exam  Constitutional: She is oriented to person, place, and time. She appears well-developed and well-nourished. No distress.  HENT:  Head: Normocephalic and atraumatic.  Eyes: Conjunctivae and EOM are normal. Pupils are equal, round, and reactive to light.  Neck: Normal range of motion. Neck supple. No thyromegaly present.  Cardiovascular: Normal rate, regular rhythm, normal heart sounds and intact distal pulses.   No murmur heard. Pulmonary/Chest: Effort normal and breath sounds normal. No respiratory distress.  Abdominal: Soft. She exhibits no distension. There is no tenderness.  Musculoskeletal: She exhibits no edema.  Lymphadenopathy:    She has no cervical adenopathy.  Neurological: She is alert and oriented to person, place, and time.  Skin: Skin is warm and dry.  Psychiatric: She has a normal mood and affect. Her behavior is normal.  Vitals reviewed.         Assessment & Plan:

## 2014-11-01 NOTE — Assessment & Plan Note (Signed)
Chronic problem.  BP still not at goal.  Had bad rxn to ARB.  Will start Amlodipine in addition to HCTZ.  Will follow.

## 2014-11-30 ENCOUNTER — Ambulatory Visit (INDEPENDENT_AMBULATORY_CARE_PROVIDER_SITE_OTHER): Payer: Federal, State, Local not specified - PPO | Admitting: Family Medicine

## 2014-11-30 ENCOUNTER — Encounter: Payer: Self-pay | Admitting: Family Medicine

## 2014-11-30 VITALS — BP 138/82 | HR 95 | Temp 98.5°F | Resp 16 | Wt 227.0 lb

## 2014-11-30 DIAGNOSIS — I10 Essential (primary) hypertension: Secondary | ICD-10-CM

## 2014-11-30 DIAGNOSIS — E119 Type 2 diabetes mellitus without complications: Secondary | ICD-10-CM

## 2014-11-30 LAB — CBC WITH DIFFERENTIAL/PLATELET
Basophils Absolute: 0 10*3/uL (ref 0.0–0.1)
Basophils Relative: 0.3 % (ref 0.0–3.0)
EOS PCT: 2.2 % (ref 0.0–5.0)
Eosinophils Absolute: 0.2 10*3/uL (ref 0.0–0.7)
HEMATOCRIT: 42.2 % (ref 36.0–46.0)
Hemoglobin: 13.8 g/dL (ref 12.0–15.0)
LYMPHS PCT: 28 % (ref 12.0–46.0)
Lymphs Abs: 2.1 10*3/uL (ref 0.7–4.0)
MCHC: 32.8 g/dL (ref 30.0–36.0)
MCV: 80.8 fl (ref 78.0–100.0)
Monocytes Absolute: 0.6 10*3/uL (ref 0.1–1.0)
Monocytes Relative: 8.1 % (ref 3.0–12.0)
NEUTROS ABS: 4.7 10*3/uL (ref 1.4–7.7)
Neutrophils Relative %: 61.4 % (ref 43.0–77.0)
Platelets: 243 10*3/uL (ref 150.0–400.0)
RBC: 5.22 Mil/uL — ABNORMAL HIGH (ref 3.87–5.11)
RDW: 14.6 % (ref 11.5–15.5)
WBC: 7.7 10*3/uL (ref 4.0–10.5)

## 2014-11-30 LAB — LIPID PANEL
CHOL/HDL RATIO: 3
CHOLESTEROL: 155 mg/dL (ref 0–200)
HDL: 52.3 mg/dL (ref >39.00)
LDL CALC: 85 mg/dL (ref 0–99)
NONHDL: 102.7
TRIGLYCERIDES: 91 mg/dL (ref 0.0–149.0)
VLDL: 18.2 mg/dL (ref 0.0–40.0)

## 2014-11-30 LAB — HEPATIC FUNCTION PANEL
ALBUMIN: 3.8 g/dL (ref 3.5–5.2)
ALT: 21 U/L (ref 0–35)
AST: 21 U/L (ref 0–37)
Alkaline Phosphatase: 66 U/L (ref 39–117)
BILIRUBIN TOTAL: 0.4 mg/dL (ref 0.2–1.2)
Bilirubin, Direct: 0.1 mg/dL (ref 0.0–0.3)
Total Protein: 7.6 g/dL (ref 6.0–8.3)

## 2014-11-30 LAB — BASIC METABOLIC PANEL
BUN: 12 mg/dL (ref 6–23)
CHLORIDE: 101 meq/L (ref 96–112)
CO2: 32 mEq/L (ref 19–32)
Calcium: 9.9 mg/dL (ref 8.4–10.5)
Creatinine, Ser: 0.82 mg/dL (ref 0.40–1.20)
GFR: 94.44 mL/min (ref >60.00)
GLUCOSE: 88 mg/dL (ref 70–99)
Potassium: 3.4 mEq/L — ABNORMAL LOW (ref 3.5–5.1)
SODIUM: 139 meq/L (ref 135–145)

## 2014-11-30 LAB — TSH: TSH: 1.5 u[IU]/mL (ref 0.35–4.50)

## 2014-11-30 LAB — HEMOGLOBIN A1C: HEMOGLOBIN A1C: 6.6 % — AB (ref 4.6–6.5)

## 2014-11-30 MED ORDER — METFORMIN HCL ER 500 MG PO TB24
500.0000 mg | ORAL_TABLET | Freq: Every day | ORAL | Status: DC
Start: 2014-11-30 — End: 2014-12-31

## 2014-11-30 NOTE — Progress Notes (Signed)
   Subjective:    Patient ID: Rhonda Morrow, female    DOB: 13-Jan-1961, 54 y.o.   MRN: [id number]  HPI DM- chronic problem, on Metformin.  Unable to take ACE/ARB due to allergy/intolerance.  UTD on eye exam.  Pt is taking 2 in the AM rather than 1 twice daily.  + diarrhea.  UTD on eye exam.  No N/V.  HTN- chronic problem, on Amlodipine, HCTZ,  No CP, SOB, HAs, visual changes,   Review of Systems For ROS see HPI     Objective:   Physical Exam  Constitutional: She is oriented to person, place, and time. She appears well-developed and well-nourished. No distress.  obese  HENT:  Head: Normocephalic and atraumatic.  Eyes: Conjunctivae and EOM are normal. Pupils are equal, round, and reactive to light.  Neck: Normal range of motion. Neck supple. No thyromegaly present.  Cardiovascular: Normal rate, regular rhythm, normal heart sounds and intact distal pulses.   No murmur heard. Pulmonary/Chest: Effort normal and breath sounds normal. No respiratory distress.  Abdominal: Soft. She exhibits no distension. There is no tenderness.  Musculoskeletal: She exhibits no edema.  Lymphadenopathy:    She has no cervical adenopathy.  Neurological: She is alert and oriented to person, place, and time.  Skin: Skin is warm and dry.  Psychiatric: She has a normal mood and affect. Her behavior is normal.  Vitals reviewed.         Assessment & Plan:

## 2014-11-30 NOTE — Assessment & Plan Note (Signed)
Ongoing problem for pt.  UTD on eye exam.  Not taking Metformin as directed.  Will switch to extended release Metformin as pt is forgetting to take PM dose.  Check microalbumin.  Reviewed need for low carb diet and regular exercise.  Check labs.  Adjust meds prn

## 2014-11-30 NOTE — Progress Notes (Signed)
Pre visit review using our clinic review tool, if applicable. No additional management support is needed unless otherwise documented below in the visit note. 

## 2014-11-30 NOTE — Patient Instructions (Signed)
Follow up in 3-4 months to recheck diabetes We'll notify you of your lab results and make any changes if needed Start the new 24 hr extended release Metformin- ONCE daily Continue the Amlodipine and HCTZ Try and make healthy food choices and get regular exercise Call with any questions or concerns Happy New Year!!!

## 2014-11-30 NOTE — Assessment & Plan Note (Signed)
Chronic problem, adequate control.  Asymptomatic.  Check labs.  No anticipated med changes. 

## 2014-11-30 NOTE — Addendum Note (Signed)
Addended by: Peggyann Shoals on: 11/30/2014 02:41 PM   Modules accepted: Orders

## 2014-12-02 NOTE — Addendum Note (Signed)
Addended by: Peggyann Shoals on: 12/02/2014 04:51 PM   Modules accepted: Orders

## 2014-12-03 LAB — MICROALBUMIN / CREATININE URINE RATIO
CREATININE, U: 278 mg/dL
MICROALB UR: 1.3 mg/dL (ref 0.0–1.9)
Microalb Creat Ratio: 0.5 mg/g (ref 0.0–30.0)

## 2014-12-30 ENCOUNTER — Other Ambulatory Visit: Payer: Self-pay | Admitting: Internal Medicine

## 2014-12-31 ENCOUNTER — Other Ambulatory Visit: Payer: Self-pay | Admitting: Family Medicine

## 2015-01-01 NOTE — Telephone Encounter (Signed)
Med filled.  

## 2015-01-04 ENCOUNTER — Other Ambulatory Visit: Payer: Self-pay | Admitting: Emergency Medicine

## 2015-01-04 MED ORDER — MOMETASONE FURO-FORMOTEROL FUM 100-5 MCG/ACT IN AERO: 2.0000 | INHALATION_SPRAY | Freq: Two times a day (BID) | RESPIRATORY_TRACT | Status: DC

## 2015-03-08 ENCOUNTER — Ambulatory Visit (INDEPENDENT_AMBULATORY_CARE_PROVIDER_SITE_OTHER): Payer: Federal, State, Local not specified - PPO | Admitting: Family Medicine

## 2015-03-08 ENCOUNTER — Encounter: Payer: Self-pay | Admitting: Family Medicine

## 2015-03-08 VITALS — BP 126/84 | HR 84 | Temp 97.9°F | Resp 16 | Wt 229.4 lb

## 2015-03-08 DIAGNOSIS — I10 Essential (primary) hypertension: Secondary | ICD-10-CM | POA: Diagnosis not present

## 2015-03-08 DIAGNOSIS — E1140 Type 2 diabetes mellitus with diabetic neuropathy, unspecified: Secondary | ICD-10-CM | POA: Diagnosis not present

## 2015-03-08 LAB — BASIC METABOLIC PANEL
BUN: 11 mg/dL (ref 6–23)
CO2: 29 meq/L (ref 19–32)
CREATININE: 0.89 mg/dL (ref 0.40–1.20)
Calcium: 9.9 mg/dL (ref 8.4–10.5)
Chloride: 103 mEq/L (ref 96–112)
GFR: 85.83 mL/min (ref >60.00)
GLUCOSE: 111 mg/dL — AB (ref 70–99)
Potassium: 3.5 mEq/L (ref 3.5–5.1)
Sodium: 140 mEq/L (ref 135–145)

## 2015-03-08 LAB — HEMOGLOBIN A1C: Hgb A1c MFr Bld: 6.3 % (ref 4.6–6.5)

## 2015-03-08 MED ORDER — METFORMIN HCL ER 500 MG PO TB24: ORAL_TABLET | ORAL | Status: DC

## 2015-03-08 MED ORDER — HYDROCHLOROTHIAZIDE 12.5 MG PO TABS: 12.5000 mg | ORAL_TABLET | Freq: Every day | ORAL | Status: DC

## 2015-03-08 NOTE — Progress Notes (Signed)
   Subjective:    Patient ID: Rhonda Morrow, female    DOB: 1961-01-12, 54 y.o.   MRN: [id number]  HPI DM- chronic problem, pt reports she is attempting to exercise and change her dietary habits.  Denies CP, SOB, HAs, some visual changes in L eye- pt has eye exam coming up.  Denies symptomatic lows.  Some tingling of feet but she describes this more as her feet falling sleep.  Will have intermittent swelling after being on feet all day at work.   Review of Systems For ROS see HPI     Objective:   Physical Exam  Constitutional: She is oriented to person, place, and time. She appears well-developed and well-nourished. No distress.  obese  HENT:  Head: Normocephalic and atraumatic.  Eyes: Conjunctivae and EOM are normal. Pupils are equal, round, and reactive to light.  Neck: Normal range of motion. Neck supple. No thyromegaly present.  Cardiovascular: Normal rate, regular rhythm, normal heart sounds and intact distal pulses.   No murmur heard. Pulmonary/Chest: Effort normal and breath sounds normal. No respiratory distress.  Abdominal: Soft. She exhibits no distension. There is no tenderness.  Musculoskeletal: She exhibits no edema.  Lymphadenopathy:    She has no cervical adenopathy.  Neurological: She is alert and oriented to person, place, and time.  Skin: Skin is warm and dry.  Psychiatric: She has a normal mood and affect. Her behavior is normal.  Vitals reviewed.         Assessment & Plan:

## 2015-03-08 NOTE — Patient Instructions (Signed)
Schedule your complete physical in 3-4 months (we'll also check the diabetes at this time) We'll notify you of your lab results and make any changes if needed Try and make healthy food choices and get regular exercise Limit your soda intake- replace this with water Call your eye doctor and get an appt to check on that black dot in your L eye Call with any questions or concerns Happy Spring!

## 2015-03-08 NOTE — Progress Notes (Signed)
Pre visit review using our clinic review tool, if applicable. No additional management support is needed unless otherwise documented below in the visit note. 

## 2015-03-08 NOTE — Assessment & Plan Note (Signed)
Chronic problem.  On Metformin w/o difficulty.  UTD on eye exam but pt is having visual changes in L eye.  Encouraged her to schedule appt for evaluation.  Foot exam WNL.  Unable to tolerate ARB/ACE for renal protection.  Again reviewed need for regular exercise and low carb diet.  Check labs.  Adjust meds prn

## 2015-03-15 ENCOUNTER — Encounter: Payer: Self-pay | Admitting: Adult Health

## 2015-03-15 ENCOUNTER — Ambulatory Visit (INDEPENDENT_AMBULATORY_CARE_PROVIDER_SITE_OTHER): Payer: Federal, State, Local not specified - PPO | Admitting: Adult Health

## 2015-03-15 VITALS — BP 120/74 | HR 84 | Temp 97.4°F | Ht 64.0 in | Wt 231.0 lb

## 2015-03-15 DIAGNOSIS — J4540 Moderate persistent asthma, uncomplicated: Secondary | ICD-10-CM

## 2015-03-15 DIAGNOSIS — J309 Allergic rhinitis, unspecified: Secondary | ICD-10-CM

## 2015-03-15 NOTE — Assessment & Plan Note (Signed)
Controlled without flare Follow-up with Dr. Chase Caller in 6 months and as needed

## 2015-03-15 NOTE — Assessment & Plan Note (Signed)
Compensated on Dulera  Plan  Continue on Dulera 2 puffs daily, rinse after use Follow-up with Dr. Chase Caller in 6 months and as needed

## 2015-03-15 NOTE — Progress Notes (Signed)
   Subjective:    Patient ID: Rhonda Morrow, female    DOB: [birth year], 54 y.o.   MRN: [id number]  HPI 54 year old female never smoker with known asthma Patient is deaf and mute  Test PFt date 01/24/12 shows mixed obstruction - restriction but greater restriction with low dlco  - fev1 1/48L/58%, Ratio 82, TLC 56, DLCO 14/51%, No BD response  CT chest 02/09/2012 showed no evidence of scarring   03/15/2015 follow-up asthma Interpreter present for visit today.  Patient returns for a six-month follow-up for asthma. She remains on Dulera twice daily. Says overall she is doing well. She denies any flare of cough, wheezing, shortness of breath. She is trying to exercise but gets winded at times. Uses her rescue inhaler occasionally . She denies any fever, chest pain, orthopnea, PND, or increased leg swelling , or syncope. She denies any hospitalizations or emergency room visits. Takes Zyrtec for allergy, controlled well   Review of Systems Constitutional:   No  weight loss, night sweats,  Fevers, chills, fatigue, or  lassitude.  HEENT:   No headaches,  Difficulty swallowing,  Tooth/dental problems, or  Sore throat,                No sneezing, itching, ear ache,  +nasal congestion, post nasal drip,   CV:  No chest pain,  Orthopnea, PND, swelling in lower extremities, anasarca, dizziness, palpitations, syncope.   GI  No heartburn, indigestion, abdominal pain, nausea, vomiting, diarrhea, change in bowel habits, loss of appetite, bloody stools.   Resp: .  No excess mucus, no productive cough,  No non-productive cough,  No coughing up of blood.  No change in color of mucus.  No wheezing.  No chest wall deformity  Skin: no rash or lesions.  GU: no dysuria, change in color of urine, no urgency or frequency.  No flank pain, no hematuria   MS:  No joint pain or swelling.  No decreased range of motion.  No back pain.  Psych:  No change in mood or affect. No depression or anxiety.  No  memory loss.         Objective:   Physical Exam GEN: A/Ox3; pleasant , NAD, obese  Deaf/mute with interpretor   HEENT:  [place]/AT,  EACs-clear, TMs-wnl, NOSE-clear, THROAT-clear, no lesions, no postnasal drip or exudate noted.   NECK:  Supple w/ fair ROM; no JVD; normal carotid impulses w/o bruits; no thyromegaly or nodules palpated; no lymphadenopathy.  RESP  Clear  P & A; w/o, wheezes/ rales/ or rhonchi.no accessory muscle use, no dullness to percussion  CARD:  RRR, no m/r/g  , no peripheral edema, pulses intact, no cyanosis or clubbing.  GI:   Soft & nt; nml bowel sounds; no organomegaly or masses detected.  Musco: Warm bil, no deformities or joint swelling noted.   Neuro: alert, no focal deficits noted.    Skin: Warm, no lesions or rashes         Assessment & Plan:

## 2015-03-15 NOTE — Patient Instructions (Signed)
Continue on Dulera 2 puffs daily, rinse after use Follow-up with Dr. Chase Caller in 6 months and as needed

## 2015-03-22 ENCOUNTER — Other Ambulatory Visit: Payer: Self-pay | Admitting: Internal Medicine

## 2015-04-14 ENCOUNTER — Ambulatory Visit (INDEPENDENT_AMBULATORY_CARE_PROVIDER_SITE_OTHER): Payer: Federal, State, Local not specified - PPO | Admitting: Medical

## 2015-04-14 ENCOUNTER — Encounter: Payer: Self-pay | Admitting: Medical

## 2015-04-14 VITALS — BP 144/76 | HR 85 | Temp 98.8°F | Ht 64.0 in | Wt 227.8 lb

## 2015-04-14 DIAGNOSIS — J209 Acute bronchitis, unspecified: Secondary | ICD-10-CM

## 2015-04-14 MED ORDER — AZITHROMYCIN 250 MG PO TABS
ORAL_TABLET | ORAL | Status: DC
Start: 2015-04-14 — End: 2015-05-21

## 2015-04-14 MED ORDER — BENZONATATE 100 MG PO CAPS: 100.0000 mg | ORAL_CAPSULE | Freq: Three times a day (TID) | ORAL | Status: DC | PRN

## 2015-04-14 NOTE — Patient Instructions (Signed)
Acute bronchitis You appear to have bronchitis. Rest hydrate and tylenol for fever. I am prescribing cough medicine benzonatate , and azithromycin antibiotic. For your nasal congestion you could use your flonse.  Use you inhalers as directed. Breathing stable now but if worsens notify us.  You should gradually get better. If not then notify us and would recommend a chest xray.  Follow up in 7-10 days or as needed

## 2015-04-14 NOTE — Assessment & Plan Note (Signed)
You appear to have bronchitis. Rest hydrate and tylenol for fever. I am prescribing cough medicine benzonatate , and azithromycin antibiotic. For your nasal congestion you could use your flonse.  Use you inhalers as directed. Breathing stable now but if worsens notify us.  You should gradually get better. If not then notify us and would recommend a chest xray.  Follow up in 7-10 days or as needed

## 2015-04-14 NOTE — Progress Notes (Signed)
Pre visit review using our clinic review tool, if applicable. No additional management support is needed unless otherwise documented below in the visit note. 

## 2015-04-14 NOTE — Progress Notes (Signed)
Subjective:    Patient ID: Rhonda Morrow, female    DOB: 03/07/1961, 54 y.o.   MRN: [id number]  HPI  Pt in for symptom that started chest congestion for one day. A lot of coughing bringing up mucuos today. Cough so much pulled stomach muscle. Pt took some allergy medicine zytrec. Also on the flonase. Feels tired. Some chills and feels tired.   Pt does not feel wheezing. Pt is on dulera. Has not had to use dulera.    Review of Systems  Constitutional: Positive for chills. Negative for fever and fatigue.  HENT: Positive for congestion. Negative for postnasal drip, sinus pressure, sneezing and sore throat.   Respiratory: Positive for cough. Negative for shortness of breath and wheezing.        Brief transient second or so burn in chest that occurs with cough episode.  Cardiovascular: Negative for chest pain and palpitations.  Musculoskeletal: Negative for back pain.  Hematological: Negative for adenopathy. Does not bruise/bleed easily.  Psychiatric/Behavioral: Negative for behavioral problems and confusion.    Past Medical History  Diagnosis Date  . Hypertension   . Deaf   . Asthma   . Thyroid disease   . Diabetes mellitus without complication     History   Social History  . Marital Status: Married    Spouse Name: N/A  . Number of Children: N/A  . Years of Education: N/A   Occupational History  . diabled    Social History Main Topics  . Smoking status: Never Smoker   . Smokeless tobacco: Never Used  . Alcohol Use: No  . Drug Use: No  . Sexual Activity: Not on file   Other Topics Concern  . Not on file   Social History Narrative    Past Surgical History  Procedure Laterality Date  . Cesarean section    . Cardiac catheterization    . Esophageal manometry N/A 09/29/2013    Procedure: ESOPHAGEAL MANOMETRY (EM);  Surgeon: Milus Banister, MD;  Location: [facility];  Service: Endoscopy;  Laterality: N/A;    Family History  Problem Relation Age of Onset  .  Diabetes Mother   . Diabetes Father   . Heart disease Mother     Allergies  Allergen Reactions  . Losartan Shortness Of Breath  . Augmentin [Amoxicillin-Pot Clavulanate] Diarrhea  . Oxycodone-Acetaminophen Nausea And Vomiting    Current Outpatient Prescriptions on File Prior to Visit  Medication Sig Dispense Refill  . albuterol (PROVENTIL HFA;VENTOLIN HFA) 108 (90 BASE) MCG/ACT inhaler Inhale 2 puffs into the lungs every 6 (six) hours as needed for wheezing. 1 Inhaler 6  . amLODipine (NORVASC) 5 MG tablet Take 1 tablet (5 mg total) by mouth daily. 30 tablet 6  . cetirizine (ZYRTEC) 10 MG tablet Take 1 tablet (10 mg total) by mouth daily. 30 tablet 11  . DULERA 100-5 MCG/ACT AERO INHALE 2 PUFFS INTO THE LUNGS 2 TIMES DAILY. 13 Inhaler 4  . famotidine (PEPCID) 20 MG tablet Take 20 mg by mouth at bedtime.    . Fiber CHEW Chew 1 tablet by mouth daily.    . fluticasone (FLONASE) 50 MCG/ACT nasal spray Place 2 sprays into both nostrils daily. 16 g 6  . hydrochlorothiazide (HYDRODIURIL) 12.5 MG tablet Take 1 tablet (12.5 mg total) by mouth daily. 90 tablet 1  . ibuprofen (ADVIL,MOTRIN) 800 MG tablet Take 1 tablet (800 mg total) by mouth every 8 (eight) hours as needed for pain. 21 tablet 0  . meclizine (ANTIVERT)  25 MG tablet Take 1 tablet (25 mg total) by mouth 3 (three) times daily as needed. 45 tablet 3  . metFORMIN (GLUCOPHAGE-XR) 500 MG 24 hr tablet TAKE 1 TABLET (500 MG TOTAL) BY MOUTH DAILY WITH BREAKFAST. 90 tablet 1  . Multiple Vitamin (MULTIVITAMIN) tablet Take 1 tablet by mouth daily.    . naproxen sodium (ANAPROX) 220 MG tablet Take 220 mg by mouth every 6 (six) hours as needed.    . pantoprazole (PROTONIX) 40 MG tablet Take 1 tablet (40 mg total) by mouth daily. 90 tablet 1  . potassium chloride SA (K-DUR,KLOR-CON) 20 MEQ tablet Take 20 mEq by mouth daily.    . traMADol (ULTRAM) 50 MG tablet Take 1 tablet (50 mg total) by mouth every 8 (eight) hours as needed. 60 tablet 0  .  cyclobenzaprine (FLEXERIL) 10 MG tablet Take 1 tablet (10 mg total) by mouth 3 (three) times daily as needed. For pain 45 tablet 3   No current facility-administered medications on file prior to visit.    BP 144/76 mmHg  Pulse 85  Temp(Src) 98.8 F (37.1 C) (Oral)  Ht 5\' 4"  (1.626 m)  Wt 227 lb 12.8 oz (103.329 kg)  BMI 39.08 kg/m2  SpO2 99%       Objective:   Physical Exam  General  Mental Status - Alert. General Appearance - Well groomed. Not in acute distress.  Skin Rashes- No Rashes.  HEENT Head- Normal. Ear Auditory Canal - Left- Normal. Right - Normal.Tympanic Membrane- Left- Normal. Right- Normal. Eye Sclera/Conjunctiva- Left- Normal. Right- Normal. Nose & Sinuses Nasal Mucosa- Left-  Not boggy or Congested. Right-  Not  boggy or Congested. Mouth & Throat Lips: Upper Lip- Normal: no dryness, cracking, pallor, cyanosis, or vesicular eruption. Lower Lip-Normal: no dryness, cracking, pallor, cyanosis or vesicular eruption. Buccal Mucosa- Bilateral- No Aphthous ulcers. Oropharynx- No Discharge or Erythema. Tonsils: Characteristics- Bilateral- No Erythema or Congestion. Size/Enlargement- Bilateral- No enlargement. Discharge- bilateral-None.  Neck Neck- Supple. No Masses.   Chest and Lung Exam Auscultation: Breath Sounds:- even and unlabored, but bilateral upper lobe rhonchi.  Cardiovascular Auscultation:Rythm- Regular, rate and rhythm. Murmurs & Other Heart Sounds:Ausculatation of the heart reveal- No Murmurs.  Lymphatic Head & Neck General Head & Neck Lymphatics: Bilateral: Description- No Localized lymphadenopathy.       Assessment & Plan:  Note pt had abdominanl muscle strain due to cough. Still some faint pain lower abdomen but only when coughing.

## 2015-04-16 ENCOUNTER — Encounter: Payer: Self-pay | Admitting: Physician Assistant

## 2015-04-16 ENCOUNTER — Ambulatory Visit (INDEPENDENT_AMBULATORY_CARE_PROVIDER_SITE_OTHER): Payer: Federal, State, Local not specified - PPO | Admitting: Physician Assistant

## 2015-04-16 VITALS — BP 139/77 | HR 81 | Temp 98.4°F | Resp 16 | Ht 64.0 in | Wt 225.0 lb

## 2015-04-16 DIAGNOSIS — J45901 Unspecified asthma with (acute) exacerbation: Secondary | ICD-10-CM | POA: Insufficient documentation

## 2015-04-16 DIAGNOSIS — J4541 Moderate persistent asthma with (acute) exacerbation: Secondary | ICD-10-CM

## 2015-04-16 MED ORDER — ALBUTEROL SULFATE HFA 108 (90 BASE) MCG/ACT IN AERS: 2.0000 | INHALATION_SPRAY | Freq: Four times a day (QID) | RESPIRATORY_TRACT | Status: DC | PRN

## 2015-04-16 MED ORDER — PREDNISONE 20 MG PO TABS: 40.0000 mg | ORAL_TABLET | Freq: Every day | ORAL | Status: DC

## 2015-04-16 MED ORDER — IPRATROPIUM-ALBUTEROL 0.5-2.5 (3) MG/3ML IN SOLN
3.0000 mL | Freq: Once | RESPIRATORY_TRACT | Status: AC
  Administered 2015-04-16: 3 mL via RESPIRATORY_TRACT

## 2015-04-16 MED ORDER — MOMETASONE FURO-FORMOTEROL FUM 100-5 MCG/ACT IN AERO: 2.0000 | INHALATION_SPRAY | Freq: Two times a day (BID) | RESPIRATORY_TRACT | Status: DC

## 2015-04-16 NOTE — Patient Instructions (Signed)
Please finish your antibiotic. Stay well hydrated. Use plain Mucinex to help with mucous production.  I have sent in refills of your inhalers.  Take as directed. Take prednisone as directed for the next 5 days. Continue cough medications.  Follow-up if symptoms are not improving.

## 2015-04-16 NOTE — Assessment & Plan Note (Signed)
Duoneb given in office.  Dulera and albuterol refilled. Rx prednisone 5 day burst.  Patient encouraged to finish antibiotic.  Supportive measures reviewed.  Follow-up 1 week.

## 2015-04-16 NOTE — Progress Notes (Signed)
Patient presents to clinic today c/o worsened chest tightness and wheezing.  Was seen 2 days ago for acute bronchitis and started on Azithromycin.  Was told to resume her Dulera.  Endorses she did not have any left when she got home.  Denies fever or chest pain.  Denies recent travel or sick contact.  Past Medical History  Diagnosis Date  . Hypertension   . Deaf   . Asthma   . Thyroid disease   . Diabetes mellitus without complication     Current Outpatient Prescriptions on File Prior to Visit  Medication Sig Dispense Refill  . amLODipine (NORVASC) 5 MG tablet Take 1 tablet (5 mg total) by mouth daily. 30 tablet 6  . azithromycin (ZITHROMAX) 250 MG tablet Take 2 tablets by mouth on day 1, followed by 1 tablet by mouth daily for 4 days. 6 tablet 0  . benzonatate (TESSALON) 100 MG capsule Take 1 capsule (100 mg total) by mouth 3 (three) times daily as needed. 21 capsule 0  . cetirizine (ZYRTEC) 10 MG tablet Take 1 tablet (10 mg total) by mouth daily. 30 tablet 11  . cyclobenzaprine (FLEXERIL) 10 MG tablet Take 1 tablet (10 mg total) by mouth 3 (three) times daily as needed. For pain 45 tablet 3  . famotidine (PEPCID) 20 MG tablet Take 20 mg by mouth at bedtime.    . Fiber CHEW Chew 1 tablet by mouth daily.    . fluticasone (FLONASE) 50 MCG/ACT nasal spray Place 2 sprays into both nostrils daily. 16 g 6  . hydrochlorothiazide (HYDRODIURIL) 12.5 MG tablet Take 1 tablet (12.5 mg total) by mouth daily. 90 tablet 1  . ibuprofen (ADVIL,MOTRIN) 800 MG tablet Take 1 tablet (800 mg total) by mouth every 8 (eight) hours as needed for pain. 21 tablet 0  . meclizine (ANTIVERT) 25 MG tablet Take 1 tablet (25 mg total) by mouth 3 (three) times daily as needed. 45 tablet 3  . metFORMIN (GLUCOPHAGE-XR) 500 MG 24 hr tablet TAKE 1 TABLET (500 MG TOTAL) BY MOUTH DAILY WITH BREAKFAST. 90 tablet 1  . Multiple Vitamin (MULTIVITAMIN) tablet Take 1 tablet by mouth daily.    . naproxen sodium (ANAPROX) 220 MG  tablet Take 220 mg by mouth every 6 (six) hours as needed.    . pantoprazole (PROTONIX) 40 MG tablet Take 1 tablet (40 mg total) by mouth daily. 90 tablet 1  . potassium chloride SA (K-DUR,KLOR-CON) 20 MEQ tablet Take 20 mEq by mouth daily.    . traMADol (ULTRAM) 50 MG tablet Take 1 tablet (50 mg total) by mouth every 8 (eight) hours as needed. 60 tablet 0   No current facility-administered medications on file prior to visit.    Allergies  Allergen Reactions  . Losartan Shortness Of Breath  . Augmentin [Amoxicillin-Pot Clavulanate] Diarrhea  . Oxycodone-Acetaminophen Nausea And Vomiting    Family History  Problem Relation Age of Onset  . Diabetes Mother   . Diabetes Father   . Heart disease Mother     History   Social History  . Marital Status: Married    Spouse Name: N/A  . Number of Children: N/A  . Years of Education: N/A   Occupational History  . diabled    Social History Main Topics  . Smoking status: Never Smoker   . Smokeless tobacco: Never Used  . Alcohol Use: No  . Drug Use: No  . Sexual Activity: Not on file   Other Topics Concern  . None  Social History Narrative   Review of Systems - See HPI.  All other ROS are negative.  BP 139/77 mmHg  Pulse 81  Temp(Src) 98.4 F (36.9 C) (Oral)  Resp 16  Ht 5\' 4"  (1.626 m)  Wt 225 lb (102.059 kg)  BMI 38.60 kg/m2  SpO2 100%  Physical Exam  Constitutional: She is oriented to person, place, and time and well-developed, well-nourished, and in no distress.  HENT:  Head: Normocephalic and atraumatic.  Right Ear: External ear normal.  Left Ear: External ear normal.  Mouth/Throat: Oropharynx is clear and moist. No oropharyngeal exudate.  TM within normal limits bilaterally.  Eyes: Conjunctivae are normal. Pupils are equal, round, and reactive to light.  Neck: Neck supple.  Cardiovascular: Normal rate, regular rhythm, normal heart sounds and intact distal pulses.   Pulmonary/Chest: Effort normal. She has  wheezes. She has no rales. She exhibits no tenderness.  Lymphadenopathy:    She has no cervical adenopathy.  Neurological: She is alert and oriented to person, place, and time.  Skin: Skin is warm and dry. No rash noted.  Psychiatric: Affect normal.  Vitals reviewed.   Recent Results (from the past 2160 hour(s))  Hemoglobin A1c     Status: None   Collection Time: 03/08/15 11:11 AM  Result Value Ref Range   Hgb A1c MFr Bld 6.3 4.6 - 6.5 %    Comment: Glycemic Control Guidelines for People with Diabetes:Non Diabetic:  <6%Goal of Therapy: <7%Additional Action Suggested:  >3%   Basic metabolic panel     Status: Abnormal   Collection Time: 03/08/15 11:11 AM  Result Value Ref Range   Sodium 140 135 - 145 mEq/L   Potassium 3.5 3.5 - 5.1 mEq/L   Chloride 103 96 - 112 mEq/L   CO2 29 19 - 32 mEq/L   Glucose, Bld 111 (H) 70 - 99 mg/dL   BUN 11 6 - 23 mg/dL   Creatinine, Ser 0.89 0.40 - 1.20 mg/dL   Calcium 9.9 8.4 - 10.5 mg/dL   GFR 85.83 >60.00 mL/min    Assessment/Plan: Acute asthma exacerbation Duoneb given in office.  Dulera and albuterol refilled. Rx prednisone 5 day burst.  Patient encouraged to finish antibiotic.  Supportive measures reviewed.  Follow-up 1 week.

## 2015-04-16 NOTE — Progress Notes (Signed)
Pre visit review using our clinic review tool, if applicable. No additional management support is needed unless otherwise documented below in the visit note/SLS  

## 2015-04-21 ENCOUNTER — Encounter: Payer: Self-pay | Admitting: Medical

## 2015-04-21 ENCOUNTER — Ambulatory Visit: Payer: Self-pay | Admitting: Medical

## 2015-04-21 ENCOUNTER — Telehealth: Payer: Self-pay | Admitting: Medical

## 2015-04-21 NOTE — Telephone Encounter (Signed)
Pt came in office today thinking had an appt, pt appt was canceled by the automated system on 04-19-15. Pt did not know and had left work to come to appt. Pt needed letter to take to work for an excuse. Pt was given letter today 04-21-15.

## 2015-04-21 NOTE — Telephone Encounter (Signed)
Pt was rescheduled for tomorrow 04-22-15.

## 2015-04-22 ENCOUNTER — Ambulatory Visit (INDEPENDENT_AMBULATORY_CARE_PROVIDER_SITE_OTHER): Payer: Federal, State, Local not specified - PPO | Admitting: Medical

## 2015-04-22 ENCOUNTER — Encounter: Payer: Self-pay | Admitting: Medical

## 2015-04-22 VITALS — BP 139/62 | HR 74 | Temp 98.7°F | Ht 64.0 in | Wt 233.6 lb

## 2015-04-22 DIAGNOSIS — J4540 Moderate persistent asthma, uncomplicated: Secondary | ICD-10-CM | POA: Diagnosis not present

## 2015-04-22 DIAGNOSIS — J209 Acute bronchitis, unspecified: Secondary | ICD-10-CM | POA: Diagnosis not present

## 2015-04-22 MED ORDER — BENZONATATE 200 MG PO CAPS: 200.0000 mg | ORAL_CAPSULE | Freq: Three times a day (TID) | ORAL | Status: DC | PRN

## 2015-04-22 NOTE — Progress Notes (Signed)
Subjective:    Patient ID: Rhonda Morrow, female    DOB: [birth year], 54 y.o.   MRN: [id number]  HPI  Pt states all symptoms are much improved. No fever or sweats. Pt did have some wheezing 2 days after I saw her she had asthma exacerbation. She was  dulera and albuterol were given(some prednisone given as well). Still bringing up some mucous but only scant amout. Faint sore throat.   I treated for bronchitis 2 days before last seen for asthma flare. I started on azithromycin and benzonatate. Then 2 days later got asthma excacerbation. Pt breathing has improved.   Review of Systems  Constitutional: Negative for fever, chills and fatigue.  HENT: Positive for sore throat. Negative for congestion, ear pain, postnasal drip, rhinorrhea, sinus pressure, sneezing and trouble swallowing.        Faint st .But has been on antibiotic.  Respiratory: Positive for cough. Negative for apnea, chest tightness and wheezing.        Mild.  Gastrointestinal: Negative for abdominal pain.  Musculoskeletal: Negative for back pain.  Neurological: Negative.   Hematological: Negative for adenopathy. Does not bruise/bleed easily.    Past Medical History  Diagnosis Date  . Hypertension   . Deaf   . Asthma   . Thyroid disease   . Diabetes mellitus without complication     History   Social History  . Marital Status: Married    Spouse Name: N/A  . Number of Children: N/A  . Years of Education: N/A   Occupational History  . diabled    Social History Main Topics  . Smoking status: Never Smoker   . Smokeless tobacco: Never Used  . Alcohol Use: No  . Drug Use: No  . Sexual Activity: Not on file   Other Topics Concern  . Not on file   Social History Narrative    Past Surgical History  Procedure Laterality Date  . Cesarean section    . Cardiac catheterization    . Esophageal manometry N/A 09/29/2013    Procedure: ESOPHAGEAL MANOMETRY (EM);  Surgeon: Milus Banister, MD;  Location: [facility];   Service: Endoscopy;  Laterality: N/A;    Family History  Problem Relation Age of Onset  . Diabetes Mother   . Diabetes Father   . Heart disease Mother     Allergies  Allergen Reactions  . Losartan Shortness Of Breath  . Augmentin [Amoxicillin-Pot Clavulanate] Diarrhea  . Oxycodone-Acetaminophen Nausea And Vomiting    Current Outpatient Prescriptions on File Prior to Visit  Medication Sig Dispense Refill  . albuterol (PROVENTIL HFA;VENTOLIN HFA) 108 (90 BASE) MCG/ACT inhaler Inhale 2 puffs into the lungs every 6 (six) hours as needed for wheezing. 1 Inhaler 1  . amLODipine (NORVASC) 5 MG tablet Take 1 tablet (5 mg total) by mouth daily. 30 tablet 6  . cetirizine (ZYRTEC) 10 MG tablet Take 1 tablet (10 mg total) by mouth daily. 30 tablet 11  . cyclobenzaprine (FLEXERIL) 10 MG tablet Take 1 tablet (10 mg total) by mouth 3 (three) times daily as needed. For pain 45 tablet 3  . famotidine (PEPCID) 20 MG tablet Take 20 mg by mouth at bedtime.    . Fiber CHEW Chew 1 tablet by mouth daily.    . fluticasone (FLONASE) 50 MCG/ACT nasal spray Place 2 sprays into both nostrils daily. 16 g 6  . hydrochlorothiazide (HYDRODIURIL) 12.5 MG tablet Take 1 tablet (12.5 mg total) by mouth daily. 90 tablet 1  . ibuprofen (  ADVIL,MOTRIN) 800 MG tablet Take 1 tablet (800 mg total) by mouth every 8 (eight) hours as needed for pain. 21 tablet 0  . metFORMIN (GLUCOPHAGE-XR) 500 MG 24 hr tablet TAKE 1 TABLET (500 MG TOTAL) BY MOUTH DAILY WITH BREAKFAST. 90 tablet 1  . mometasone-formoterol (DULERA) 100-5 MCG/ACT AERO Inhale 2 puffs into the lungs 2 (two) times daily. 8.8 g 3  . naproxen sodium (ANAPROX) 220 MG tablet Take 220 mg by mouth every 6 (six) hours as needed.    . pantoprazole (PROTONIX) 40 MG tablet Take 1 tablet (40 mg total) by mouth daily. 90 tablet 1  . potassium chloride SA (K-DUR,KLOR-CON) 20 MEQ tablet Take 20 mEq by mouth daily.    . predniSONE (DELTASONE) 20 MG tablet Take 2 tablets (40 mg  total) by mouth daily with breakfast. 10 tablet 0  . traMADol (ULTRAM) 50 MG tablet Take 1 tablet (50 mg total) by mouth every 8 (eight) hours as needed. 60 tablet 0  . azithromycin (ZITHROMAX) 250 MG tablet Take 2 tablets by mouth on day 1, followed by 1 tablet by mouth daily for 4 days. 6 tablet 0  . meclizine (ANTIVERT) 25 MG tablet Take 1 tablet (25 mg total) by mouth 3 (three) times daily as needed. 45 tablet 3  . Multiple Vitamin (MULTIVITAMIN) tablet Take 1 tablet by mouth daily.     No current facility-administered medications on file prior to visit.    BP 139/62 mmHg  Pulse 74  Temp(Src) 98.7 F (37.1 C) (Oral)  Ht 5\' 4"  (1.626 m)  Wt 233 lb 9.6 oz (105.96 kg)  BMI 40.08 kg/m2  SpO2 98%       Objective:   Physical Exam  General  Mental Status - Alert. General Appearance - Well groomed. Not in acute distress.  Skin Rashes- No Rashes.  HEENT Head- Normal. Ear Auditory Canal - Left- Normal. Right - Normal.Tympanic Membrane- Left- Normal. Right- Normal. Eye Sclera/Conjunctiva- Left- Normal. Right- Normal. Nose & Sinuses Nasal Mucosa- Left-  Not boggy or Congested. Right-  Not  boggy or Congested. No sinus pressure Mouth & Throat Lips: Upper Lip- Normal: no dryness, cracking, pallor, cyanosis, or vesicular eruption. Lower Lip-Normal: no dryness, cracking, pallor, cyanosis or vesicular eruption. Buccal Mucosa- Bilateral- No Aphthous ulcers. Oropharynx- No Discharge or Erythema.(on inspection no indicators of thrush) Tonsils: Characteristics- Bilateral- No Erythema or Congestion. Size/Enlargement- Bilateral- No enlargement. Discharge- bilateral-None.  Neck Neck- Supple. No Masses.   Chest and Lung Exam Auscultation: Breath Sounds:- even and unlabored, No wheezing.  Cardiovascular Auscultation:Rythm- Regular, rate and rhythm. Murmurs & Other Heart Sounds:Ausculatation of the heart reveal- No Murmurs.  Lymphatic Head & Neck General Head & Neck  Lymphatics: Bilateral: Description- No Localized lymphadenopathy.       Assessment & Plan:

## 2015-04-22 NOTE — Patient Instructions (Addendum)
Acute bronchitis Resolved now. Residual cough. Will make benzonatate available.  Moderate persistent asthma Much improved continue dulera and albuterol. If you were to get reflare again then would get cxr.  Recent excacerbation resolved.   Follow up as regularly scheduled with pcp or as needed.

## 2015-04-22 NOTE — Assessment & Plan Note (Signed)
Resolved now. Residual cough. Will make benzonatate available.

## 2015-04-22 NOTE — Assessment & Plan Note (Addendum)
Much improved continue dulera and albuterol. If you were to get reflare again then would get cxr.  Recent excacerbation resolved.

## 2015-04-22 NOTE — Progress Notes (Signed)
Pre visit review using our clinic review tool, if applicable. No additional management support is needed unless otherwise documented below in the visit note. 

## 2015-04-22 NOTE — Assessment & Plan Note (Deleted)
Much improved continue dulera and albuterol. If you were to get reflare again then would get cxr.

## 2015-05-21 ENCOUNTER — Ambulatory Visit (INDEPENDENT_AMBULATORY_CARE_PROVIDER_SITE_OTHER): Payer: Federal, State, Local not specified - PPO | Admitting: Family Medicine

## 2015-05-21 ENCOUNTER — Encounter: Payer: Self-pay | Admitting: Family Medicine

## 2015-05-21 VITALS — BP 130/82 | HR 85 | Temp 98.2°F | Resp 17 | Wt 228.5 lb

## 2015-05-21 DIAGNOSIS — R197 Diarrhea, unspecified: Secondary | ICD-10-CM

## 2015-05-21 NOTE — Patient Instructions (Signed)
Follow up as scheduled Increase your water intake Try to eat regularly- start w/ applesauce, bananas, rice, chicken, etc AVOID dairy and fried foods until you are no longer having diarrhea Once the stools are normal, please start Miralax once daily to prevent constipation Call with any questions or concerns Hang in there!

## 2015-05-21 NOTE — Progress Notes (Signed)
   Subjective:    Patient ID: [name], female    DOB: [birth year], 54 y.o.   MRN: [id number]  HPI abd pain- Sunday was very constipated.  Monday took OTC medication- stool softener- in addition to Miralax and [employer].  Then developed a lot of abd pain and pressure.  Stools were loose.  Missed work on Wednesday, attempted to go to work on Thursday but had to leave.  Yesterday had 3 loose stools, today had 2 loose stools.  Pain is improving as stools are lessening.   Review of Systems For ROS see HPI     Objective:   Physical Exam  Constitutional: She is oriented to person, place, and time. She appears well-developed and well-nourished. No distress.  HENT:  Head: Normocephalic and atraumatic.  MMM  Cardiovascular: Intact distal pulses.   Abdominal: Soft. Bowel sounds are normal. She exhibits no distension. There is no tenderness. There is no rebound and no guarding.  Musculoskeletal: She exhibits no edema.  Neurological: She is alert and oriented to person, place, and time.  Skin: Skin is warm and dry. No pallor.  Psychiatric: She has a normal mood and affect. Her behavior is normal. Thought content normal.  Vitals reviewed.         Assessment & Plan:

## 2015-05-21 NOTE — Progress Notes (Signed)
Pre visit review using our clinic review tool, if applicable. No additional management support is needed unless otherwise documented below in the visit note. 

## 2015-05-22 NOTE — Assessment & Plan Note (Signed)
New.  Based on pt's reported intake of constipation products, her subsequent abd pain and diarrhea is not surprising.  Once pt's diarrhea was improving, she ate fried rice which again caused diarrhea.  Reviewed for need to treat constipation gradually and only take 1 product at a time.  Also discussed BRAT diet and adequate hydration.  Reviewed supportive care and red flags that should prompt return.  Pt expressed understanding and is in agreement w/ plan.

## 2015-06-04 DIAGNOSIS — Z0279 Encounter for issue of other medical certificate: Secondary | ICD-10-CM

## 2015-06-07 ENCOUNTER — Telehealth: Payer: Self-pay | Admitting: *Deleted

## 2015-06-07 NOTE — Telephone Encounter (Signed)
Pt dropped off FMLA forms needed for [employer]. Forms filled out as much as possible and forwarded to Dr. Birdie Riddle. JG//CMA

## 2015-06-09 ENCOUNTER — Other Ambulatory Visit: Payer: Self-pay | Admitting: Family Medicine

## 2015-06-09 NOTE — Telephone Encounter (Signed)
Completed forms faxed and mailed to employer. [employer] placed up front for pick up. Copy sent for scanning. JG//CMA

## 2015-06-09 NOTE — Telephone Encounter (Signed)
Medication filled to pharmacy as requested.   

## 2015-06-16 ENCOUNTER — Other Ambulatory Visit: Payer: Self-pay

## 2015-06-16 ENCOUNTER — Encounter: Payer: Self-pay | Admitting: Internal Medicine

## 2015-06-16 ENCOUNTER — Ambulatory Visit (INDEPENDENT_AMBULATORY_CARE_PROVIDER_SITE_OTHER): Payer: Federal, State, Local not specified - PPO | Admitting: Internal Medicine

## 2015-06-16 VITALS — BP 135/72 | HR 78 | Temp 97.4°F | Ht 64.0 in | Wt 231.0 lb

## 2015-06-16 DIAGNOSIS — M25512 Pain in left shoulder: Secondary | ICD-10-CM

## 2015-06-16 DIAGNOSIS — I10 Essential (primary) hypertension: Secondary | ICD-10-CM | POA: Diagnosis not present

## 2015-06-16 NOTE — Patient Instructions (Signed)
Take naproxen OTC 1 tablet twice a day with food  as needed for pain.  Always take it with food because may cause gastritis and ulcers.  If you notice nausea, stomach pain, change in the color of stools --->  Stop the medicine and let us know  Ice the shoulder  Will refer you to the sports medicine doctor  Call if you are not improving in the next few days

## 2015-06-16 NOTE — Progress Notes (Signed)
Pre visit review using our clinic review tool, if applicable. No additional management support is needed unless otherwise documented below in the visit note. 

## 2015-06-16 NOTE — Progress Notes (Signed)
Subjective:    Patient ID: Rhonda Morrow, female    DOB: 01-Jul-1961, 54 y.o.   MRN: [id number]  DOS:  06/16/2015 Type of visit - description : Acute, patient is deaf, here with a interpreter Interval history:  Chief complaint is pain at the left shoulder, anteriorly, going on for about one week. No radiation, denies any injury, she admits to doing repetitive motion at work, she drives a [employer] . She noted a lump at the area and is slightly TTP.  Medication reviewed, not taking potassium supplements. No neck pain per se, pain does not radiate to the arm, some radiation to the trapezoid area?  Review of Systems See history of present illness  Past Medical History  Diagnosis Date  . Hypertension   . Deaf   . Asthma   . Thyroid disease   . Diabetes mellitus without complication     Past Surgical History  Procedure Laterality Date  . Cesarean section    . Cardiac catheterization    . Esophageal manometry N/A 09/29/2013    Procedure: ESOPHAGEAL MANOMETRY (EM);  Surgeon: Milus Banister, MD;  Location: [facility];  Service: Endoscopy;  Laterality: N/A;    Social History   Social History  . Marital Status: Married    Spouse Name: N/A  . Number of Children: N/A  . Years of Education: N/A   Occupational History  . diabled    Social History Main Topics  . Smoking status: Never Smoker   . Smokeless tobacco: Never Used  . Alcohol Use: No  . Drug Use: No  . Sexual Activity: Not on file   Other Topics Concern  . Not on file   Social History Narrative        Medication List       This list is accurate as of: 06/16/15 11:59 PM.  Always use your most recent med list.               albuterol 108 (90 BASE) MCG/ACT inhaler  Commonly known as:  PROVENTIL HFA;VENTOLIN HFA  Inhale 2 puffs into the lungs every 6 (six) hours as needed for wheezing.     amLODipine 5 MG tablet  Commonly known as:  NORVASC  Take 1 tablet (5 mg total) by mouth daily.     cetirizine 10  MG tablet  Commonly known as:  ZYRTEC  Take 1 tablet (10 mg total) by mouth daily.     famotidine 20 MG tablet  Commonly known as:  PEPCID  Take 20 mg by mouth at bedtime.     Fiber Chew  Chew 1 tablet by mouth daily.     fluticasone 50 MCG/ACT nasal spray  Commonly known as:  FLONASE  PLACE 2 SPRAYS INTO BOTH NOSTRILS DAILY.     hydrochlorothiazide 12.5 MG tablet  Commonly known as:  HYDRODIURIL  Take 1 tablet (12.5 mg total) by mouth daily.     meclizine 25 MG tablet  Commonly known as:  ANTIVERT  Take 1 tablet (25 mg total) by mouth 3 (three) times daily as needed.     metFORMIN 500 MG 24 hr tablet  Commonly known as:  GLUCOPHAGE-XR  TAKE 1 TABLET (500 MG TOTAL) BY MOUTH DAILY WITH BREAKFAST.     mometasone-formoterol 100-5 MCG/ACT Aero  Commonly known as:  DULERA  Inhale 2 puffs into the lungs 2 (two) times daily.     multivitamin tablet  Take 1 tablet by mouth daily.     naproxen sodium  220 MG tablet  Commonly known as:  ANAPROX  Take 220 mg by mouth every 6 (six) hours as needed.     pantoprazole 40 MG tablet  Commonly known as:  PROTONIX  Take 1 tablet (40 mg total) by mouth daily.     potassium chloride SA 20 MEQ tablet  Commonly known as:  K-DUR,KLOR-CON  Take 20 mEq by mouth daily.           Objective:   Physical Exam  Neck:     BP 135/72 mmHg  Pulse 78  Temp(Src) 97.4 F (36.3 C) (Oral)  Ht 5\' 4"  (1.626 m)  Wt 231 lb (104.781 kg)  BMI 39.63 kg/m2  SpO2 98% General:   Well developed, well nourished . NAD.  HEENT:  Normocephalic . Face symmetric, atraumatic Neck: Full range of motion MSK: Right shoulder normal Left shoulder:  No deformities, moderately TTP at the [facility] joint. Range of motion decreased particularly with elevation of arm above the head. Neurologic:  alert & oriented X3.  Speech normal, gait appropriate for age and unassisted DTRs and motor symmetric Psych--  Cognition and judgment appear intact.  Cooperative with  normal attention span and concentration.  Behavior appropriate. No anxious or depressed appearing.      Assessment & Plan:   Shoulder pain Pain located at the L [facility] joint, range of motion is quite decreased.  Plan: Restart naproxen which she has taken before, ice, sports medicine referral.  Hypertension: Reports she is taking hydrochlorothiazide but not potassium supplements, recommend to restart potassium supplements.

## 2015-06-21 ENCOUNTER — Ambulatory Visit: Payer: Federal, State, Local not specified - PPO | Admitting: Family Medicine

## 2015-06-24 ENCOUNTER — Ambulatory Visit (INDEPENDENT_AMBULATORY_CARE_PROVIDER_SITE_OTHER): Admitting: Family Medicine

## 2015-06-24 ENCOUNTER — Telehealth: Payer: Self-pay | Admitting: Family Medicine

## 2015-06-24 ENCOUNTER — Ambulatory Visit (HOSPITAL_BASED_OUTPATIENT_CLINIC_OR_DEPARTMENT_OTHER)
Admission: RE | Admit: 2015-06-24 | Discharge: 2015-06-24 | Disposition: A | Discharge status: Home / Self Care | Payer: Federal, State, Local not specified - PPO | Source: Ambulatory Visit | Attending: Family Medicine | Admitting: Family Medicine

## 2015-06-24 ENCOUNTER — Encounter: Payer: Self-pay | Admitting: Family Medicine

## 2015-06-24 VITALS — BP 119/76 | Ht 64.0 in | Wt 250.0 lb

## 2015-06-24 DIAGNOSIS — M25512 Pain in left shoulder: Secondary | ICD-10-CM | POA: Insufficient documentation

## 2015-06-24 DIAGNOSIS — S4992XA Unspecified injury of left shoulder and upper arm, initial encounter: Secondary | ICD-10-CM

## 2015-06-24 DIAGNOSIS — M67912 Unspecified disorder of synovium and tendon, left shoulder: Secondary | ICD-10-CM

## 2015-06-24 MED ORDER — DICLOFENAC SODIUM 75 MG PO TBEC: 75.0000 mg | DELAYED_RELEASE_TABLET | Freq: Two times a day (BID) | ORAL | Status: DC

## 2015-06-24 NOTE — Telephone Encounter (Signed)
Error:[id number] (patient physical appointment needed to be [facility]

## 2015-06-24 NOTE — Telephone Encounter (Signed)
pre visit letter mailed 06/14/15

## 2015-06-24 NOTE — Patient Instructions (Addendum)
You have strained your rotator cuff Try to avoid painful activities (overhead activities, lifting with extended arm) as much as possible. Diclofenac twice a day with food for pain and inflammation. Can take tylenol in addition to this. Subacromial injection may be beneficial to help with pain and to decrease inflammation - I typically recommend against this right now given you've had an injury. Start physical therapy with transition to home exercise program. Do home exercise program with theraband and scapular stabilization exercises daily - these are very important for long term relief even if an injection was given. If not improving at follow-up we will consider further imaging, injection, physical therapy, and/or nitro patches. Follow up with me in 5-6 weeks.

## 2015-06-29 ENCOUNTER — Telehealth: Payer: Self-pay | Admitting: Behavioral Health

## 2015-06-29 NOTE — Assessment & Plan Note (Signed)
consistent with rotator cuff strain.  Radiographs negative for fracture.  Start with diclofenac, physical therapy, home exercises.  Consider MRI if not improving at f/u in 5-6 weeks.  Discussed no lifting more than 15 pounds, no overhead work. Forms filled out.

## 2015-06-29 NOTE — Progress Notes (Signed)
PCP: Annye Asa, MD Referred by Dr. Larose Kells  Subjective:   HPI: Patient is a 54 y.o. female here for left shoulder injury.  Interpreter used throughout visit. Patient reports injury occurred on 7/29 at work. She drives a forklift at work. Felt a pop in left shoulder while doing so - mentioned some of equipment was broken and she did not realize this. Seemed to be mild but later that day when doing sign language she felt a lot of pain in the left shoulder. Pain currently 7/10. Tried aleve, advil, icing. Has not had radiographs. + night pain. Is right handed. Remotely  Had problems with this joint - had an injection and completely improved.  Past Medical History  Diagnosis Date  . Hypertension   . Deaf   . Asthma   . Thyroid disease   . Diabetes mellitus without complication     Current Outpatient Prescriptions on File Prior to Visit  Medication Sig Dispense Refill  . albuterol (PROVENTIL HFA;VENTOLIN HFA) 108 (90 BASE) MCG/ACT inhaler Inhale 2 puffs into the lungs every 6 (six) hours as needed for wheezing. 1 Inhaler 1  . amLODipine (NORVASC) 5 MG tablet Take 1 tablet (5 mg total) by mouth daily. 30 tablet 6  . cetirizine (ZYRTEC) 10 MG tablet Take 1 tablet (10 mg total) by mouth daily. 30 tablet 11  . famotidine (PEPCID) 20 MG tablet Take 20 mg by mouth at bedtime.    . Fiber CHEW Chew 1 tablet by mouth daily.    . fluticasone (FLONASE) 50 MCG/ACT nasal spray PLACE 2 SPRAYS INTO BOTH NOSTRILS DAILY. 16 g 4  . hydrochlorothiazide (HYDRODIURIL) 12.5 MG tablet Take 1 tablet (12.5 mg total) by mouth daily. 90 tablet 1  . meclizine (ANTIVERT) 25 MG tablet Take 1 tablet (25 mg total) by mouth 3 (three) times daily as needed. (Patient not taking: Reported on 06/16/2015) 45 tablet 3  . metFORMIN (GLUCOPHAGE-XR) 500 MG 24 hr tablet TAKE 1 TABLET (500 MG TOTAL) BY MOUTH DAILY WITH BREAKFAST. 90 tablet 1  . mometasone-formoterol (DULERA) 100-5 MCG/ACT AERO Inhale 2 puffs into the lungs  2 (two) times daily. 8.8 g 3  . Multiple Vitamin (MULTIVITAMIN) tablet Take 1 tablet by mouth daily.    . naproxen sodium (ANAPROX) 220 MG tablet Take 220 mg by mouth every 6 (six) hours as needed.    . pantoprazole (PROTONIX) 40 MG tablet Take 1 tablet (40 mg total) by mouth daily. (Patient not taking: Reported on 06/16/2015) 90 tablet 1  . potassium chloride SA (K-DUR,KLOR-CON) 20 MEQ tablet Take 20 mEq by mouth daily.     No current facility-administered medications on file prior to visit.    Past Surgical History  Procedure Laterality Date  . Cesarean section    . Cardiac catheterization    . Esophageal manometry N/A 09/29/2013    Procedure: ESOPHAGEAL MANOMETRY (EM);  Surgeon: Milus Banister, MD;  Location: [facility];  Service: Endoscopy;  Laterality: N/A;    Allergies  Allergen Reactions  . Losartan Shortness Of Breath  . Augmentin [Amoxicillin-Pot Clavulanate] Diarrhea  . Oxycodone-Acetaminophen Nausea And Vomiting    Social History   Social History  . Marital Status: Married    Spouse Name: N/A  . Number of Children: N/A  . Years of Education: N/A   Occupational History  . diabled    Social History Main Topics  . Smoking status: Never Smoker   . Smokeless tobacco: Never Used  . Alcohol Use: No  .  Drug Use: No  . Sexual Activity: Not on file   Other Topics Concern  . Not on file   Social History Narrative    Family History  Problem Relation Age of Onset  . Diabetes Mother   . Diabetes Father   . Heart disease Mother     BP 119/76 mmHg  Ht 5\' 4"  (1.626 m)  Wt 250 lb (113.399 kg)  BMI 42.89 kg/m2  Review of Systems: See HPI above.    Objective:  Physical Exam:  Gen: NAD  Left shoulder: No swelling, ecchymoses.  No gross deformity. TTP distal clavicle, anterior shoulder. FROM with painful arc. Positive Hawkins, Neers. Negative Speeds, Yergasons. Strength 4/5 with empty can and 5/5 resisted internal/external rotation.  Pain empty can and  ER. Negative apprehension. NV intact distally.    Assessment & Plan:  1. Left shoulder injury - consistent with rotator cuff strain.  Radiographs negative for fracture.  Start with diclofenac, physical therapy, home exercises.  Consider MRI if not improving at f/u in 5-6 weeks.  Discussed no lifting more than 15 pounds, no overhead work. Forms filled out.

## 2015-06-29 NOTE — Telephone Encounter (Signed)
Unable to reach patient at time of Pre-Visit Call.  Left message for patient to return call when available.    

## 2015-06-30 ENCOUNTER — Ambulatory Visit (INDEPENDENT_AMBULATORY_CARE_PROVIDER_SITE_OTHER): Payer: Federal, State, Local not specified - PPO | Admitting: Family Medicine

## 2015-06-30 DIAGNOSIS — Z0000 Encounter for general adult medical examination without abnormal findings: Secondary | ICD-10-CM | POA: Diagnosis not present

## 2015-06-30 DIAGNOSIS — E118 Type 2 diabetes mellitus with unspecified complications: Secondary | ICD-10-CM | POA: Diagnosis not present

## 2015-07-01 ENCOUNTER — Other Ambulatory Visit: Payer: Self-pay | Admitting: General Practice

## 2015-07-01 LAB — CBC WITH DIFFERENTIAL/PLATELET
BASOS ABS: 0 10*3/uL (ref 0.0–0.1)
Basophils Relative: 0.6 % (ref 0.0–3.0)
EOS PCT: 2.9 % (ref 0.0–5.0)
Eosinophils Absolute: 0.2 10*3/uL (ref 0.0–0.7)
HCT: 41.1 % (ref 36.0–46.0)
HEMOGLOBIN: 13.5 g/dL (ref 12.0–15.0)
Lymphocytes Relative: 27 % (ref 12.0–46.0)
Lymphs Abs: 2 10*3/uL (ref 0.7–4.0)
MCHC: 32.8 g/dL (ref 30.0–36.0)
MCV: 83 fl (ref 78.0–100.0)
MONO ABS: 0.5 10*3/uL (ref 0.1–1.0)
MONOS PCT: 7.1 % (ref 3.0–12.0)
NEUTROS PCT: 62.4 % (ref 43.0–77.0)
Neutro Abs: 4.5 10*3/uL (ref 1.4–7.7)
Platelets: 229 10*3/uL (ref 150.0–400.0)
RBC: 4.95 Mil/uL (ref 3.87–5.11)
RDW: 15.2 % (ref 11.5–15.5)
WBC: 7.2 10*3/uL (ref 4.0–10.5)

## 2015-07-01 LAB — BASIC METABOLIC PANEL
BUN: 16 mg/dL (ref 6–23)
CHLORIDE: 102 meq/L (ref 96–112)
CO2: 31 meq/L (ref 19–32)
Calcium: 9.5 mg/dL (ref 8.4–10.5)
Creatinine, Ser: 0.78 mg/dL (ref 0.40–1.20)
GFR: 99.82 mL/min (ref >60.00)
GLUCOSE: 89 mg/dL (ref 70–99)
POTASSIUM: 3.7 meq/L (ref 3.5–5.1)
SODIUM: 139 meq/L (ref 135–145)

## 2015-07-01 LAB — LIPID PANEL
CHOL/HDL RATIO: 3
Cholesterol: 170 mg/dL (ref 0–200)
HDL: 61.4 mg/dL (ref >39.00)
LDL Cholesterol: 94 mg/dL (ref 0–99)
NONHDL: 108.81
Triglycerides: 73 mg/dL (ref 0.0–149.0)
VLDL: 14.6 mg/dL (ref 0.0–40.0)

## 2015-07-01 LAB — HEPATIC FUNCTION PANEL
ALBUMIN: 3.9 g/dL (ref 3.5–5.2)
ALK PHOS: 72 U/L (ref 39–117)
ALT: 42 U/L — ABNORMAL HIGH (ref 0–35)
AST: 38 U/L — AB (ref 0–37)
BILIRUBIN DIRECT: 0.1 mg/dL (ref 0.0–0.3)
TOTAL PROTEIN: 7.3 g/dL (ref 6.0–8.3)
Total Bilirubin: 0.2 mg/dL (ref 0.2–1.2)

## 2015-07-01 LAB — TSH: TSH: 1.41 u[IU]/mL (ref 0.35–4.50)

## 2015-07-01 LAB — VITAMIN D 25 HYDROXY (VIT D DEFICIENCY, FRACTURES): VITD: 24.96 ng/mL — AB (ref 30.00–100.00)

## 2015-07-01 LAB — HEMOGLOBIN A1C: Hgb A1c MFr Bld: 6.3 % (ref 4.6–6.5)

## 2015-07-01 MED ORDER — VITAMIN D (ERGOCALCIFEROL) 1.25 MG (50000 UNIT) PO CAPS: 50000.0000 [IU] | ORAL_CAPSULE | ORAL | Status: DC

## 2015-07-05 ENCOUNTER — Encounter: Payer: Self-pay | Admitting: Family Medicine

## 2015-07-05 NOTE — Assessment & Plan Note (Signed)
Pt's PE WNL w/ exception of obesity.  Stressed need for healthy diet and regular exercise.  UTD on colonoscopy, mammo, pap.  Check labs.  Anticipatory guidance provided.

## 2015-07-05 NOTE — Assessment & Plan Note (Signed)
Check A1C.  Adjust meds prn.

## 2015-07-05 NOTE — Progress Notes (Signed)
   Subjective:    Patient ID: Rhonda Morrow, female    DOB: [birth year], 54 y.o.   MRN: [id number]  HPI Visit done during EPIC down time.  CPE- UTD on colonoscopy, mammo, pap.  No concerns today.   Review of Systems- obtained via interpreter  Patient reports no vision/ hearing changes, adenopathy,fever, weight change,  persistant/recurrent hoarseness , swallowing issues, chest pain, palpitations, edema, persistant/recurrent cough, hemoptysis, dyspnea (rest/exertional/paroxysmal nocturnal), gastrointestinal bleeding (melena, rectal bleeding), abdominal pain, significant heartburn, bowel changes, GU symptoms (dysuria, hematuria, incontinence), Gyn symptoms (abnormal  bleeding, pain),  syncope, focal weakness, memory loss, numbness & tingling, skin/hair/nail changes, abnormal bruising or bleeding, anxiety, or depression.     Objective:   Physical Exam General Appearance:    Alert, cooperative, no distress, appears stated age  Head:    Normocephalic, without obvious abnormality, atraumatic  Eyes:    PERRL, conjunctiva/corneas clear, EOM's intact, fundi    benign, both eyes  Ears:    Normal TM's and external ear canals, both ears  Nose:   Nares normal, septum midline, mucosa normal, no drainage    or sinus tenderness  Throat:   Lips, mucosa, and tongue normal; teeth and gums normal  Neck:   Supple, symmetrical, trachea midline, no adenopathy;    Thyroid: no enlargement/tenderness/nodules  Back:     Symmetric, no curvature, ROM normal, no CVA tenderness  Lungs:     Clear to auscultation bilaterally, respirations unlabored  Chest Wall:    No tenderness or deformity   Heart:    Regular rate and rhythm, S1 and S2 normal, no murmur, rub   or gallop  Breast Exam:    Deferred to GYN  Abdomen:     Soft, non-tender, bowel sounds active all four quadrants,    no masses, no organomegaly  Genitalia:    Deferred to GYN  Rectal:    Extremities:   Extremities normal, atraumatic, no cyanosis or edema    Pulses:   2+ and symmetric all extremities  Skin:   Skin color, texture, turgor normal, no rashes or lesions  Lymph nodes:   Cervical, supraclavicular, and axillary nodes normal  Neurologic:   CNII-XII intact, normal strength, sensation and reflexes    throughout          Assessment & Plan:

## 2015-07-10 ENCOUNTER — Other Ambulatory Visit: Payer: Self-pay | Admitting: Family Medicine

## 2015-07-13 NOTE — Telephone Encounter (Signed)
Medication filled to pharmacy as requested.   

## 2015-07-22 ENCOUNTER — Other Ambulatory Visit: Payer: Self-pay | Admitting: Family Medicine

## 2015-07-22 NOTE — Telephone Encounter (Signed)
Medication filled to pharmacy as requested.   

## 2015-07-29 ENCOUNTER — Encounter: Payer: Self-pay | Admitting: Family Medicine

## 2015-07-29 ENCOUNTER — Ambulatory Visit (INDEPENDENT_AMBULATORY_CARE_PROVIDER_SITE_OTHER): Admitting: Family Medicine

## 2015-07-29 VITALS — BP 141/78 | HR 73 | Ht 65.0 in | Wt 230.0 lb

## 2015-07-29 DIAGNOSIS — M25512 Pain in left shoulder: Secondary | ICD-10-CM | POA: Diagnosis not present

## 2015-07-29 DIAGNOSIS — M67912 Unspecified disorder of synovium and tendon, left shoulder: Secondary | ICD-10-CM

## 2015-07-29 MED ORDER — METHYLPREDNISOLONE ACETATE 40 MG/ML IJ SUSP
40.0000 mg | Freq: Once | INTRAMUSCULAR | Status: AC
  Administered 2015-07-29: 40 mg via INTRA_ARTICULAR

## 2015-07-29 NOTE — Patient Instructions (Signed)
You have strained your rotator cuff Try to avoid painful activities (overhead activities, lifting with extended arm) as much as possible. Continue diclofenac twice a day with food for pain and inflammation. Can take tylenol in addition to this. Subacromial injection may be beneficial to help with pain and to decrease inflammation - let me know if you want to try one of these. Start physical therapy with transition to home exercise program. Do home exercise program with theraband and scapular stabilization exercises daily - these are very important for long term relief even if an injection was given. If not improving at follow-up we will consider further imaging, injection, and/or nitro patches. Follow up with me in 5-6 weeks.

## 2015-08-03 ENCOUNTER — Encounter: Payer: Self-pay | Admitting: Family Medicine

## 2015-08-03 NOTE — Progress Notes (Signed)
PCP: Annye Asa, MD Referred by Dr. Larose Kells  Subjective:   HPI: Patient is a 54 y.o. female here for left shoulder injury.  8/18: Interpreter used throughout visit. Patient reports injury occurred on 7/29 at work. She drives a forklift at work. Felt a pop in left shoulder while doing so - mentioned some of equipment was broken and she did not realize this. Seemed to be mild but later that day when doing sign language she felt a lot of pain in the left shoulder. Pain currently 7/10. Tried aleve, advil, icing. Has not had radiographs. + night pain. Is right handed. Remotely  Had problems with this joint - had an injection and completely improved.  9/22: Interpreter present for visit. Patient reports pain still 6/10 at work, 5/10 at rest. Has not heard yet about physical therapy. No other changes.  Past Medical History  Diagnosis Date  . Hypertension   . Deaf   . Asthma   . Thyroid disease   . Diabetes mellitus without complication     Current Outpatient Prescriptions on File Prior to Visit  Medication Sig Dispense Refill  . albuterol (PROVENTIL HFA;VENTOLIN HFA) 108 (90 BASE) MCG/ACT inhaler Inhale 2 puffs into the lungs every 6 (six) hours as needed for wheezing. 1 Inhaler 1  . amLODipine (NORVASC) 5 MG tablet TAKE 1 TABLET (5 MG TOTAL) BY MOUTH DAILY. 30 tablet 6  . cetirizine (ZYRTEC) 10 MG tablet TAKE 1 TABLET BY MOUTH EVERY DAY 30 tablet 4  . diclofenac (VOLTAREN) 75 MG EC tablet Take 1 tablet (75 mg total) by mouth 2 (two) times daily. 60 tablet 1  . famotidine (PEPCID) 20 MG tablet Take 20 mg by mouth at bedtime.    . Fiber CHEW Chew 1 tablet by mouth daily.    . fluticasone (FLONASE) 50 MCG/ACT nasal spray PLACE 2 SPRAYS INTO BOTH NOSTRILS DAILY. 16 g 4  . hydrochlorothiazide (HYDRODIURIL) 12.5 MG tablet Take 1 tablet (12.5 mg total) by mouth daily. 90 tablet 1  . meclizine (ANTIVERT) 25 MG tablet Take 1 tablet (25 mg total) by mouth 3 (three) times daily as  needed. (Patient not taking: Reported on 06/16/2015) 45 tablet 3  . metFORMIN (GLUCOPHAGE-XR) 500 MG 24 hr tablet TAKE 1 TABLET (500 MG TOTAL) BY MOUTH DAILY WITH BREAKFAST. 90 tablet 1  . mometasone-formoterol (DULERA) 100-5 MCG/ACT AERO Inhale 2 puffs into the lungs 2 (two) times daily. 8.8 g 3  . Multiple Vitamin (MULTIVITAMIN) tablet Take 1 tablet by mouth daily.    . naproxen sodium (ANAPROX) 220 MG tablet Take 220 mg by mouth every 6 (six) hours as needed.    . pantoprazole (PROTONIX) 40 MG tablet Take 1 tablet (40 mg total) by mouth daily. (Patient not taking: Reported on 06/16/2015) 90 tablet 1  . potassium chloride SA (K-DUR,KLOR-CON) 20 MEQ tablet Take 20 mEq by mouth daily.    . Vitamin D, Ergocalciferol, (DRISDOL) 50000 UNITS CAPS capsule Take 1 capsule (50,000 Units total) by mouth every 7 (seven) days. 4 capsule 2   No current facility-administered medications on file prior to visit.    Past Surgical History  Procedure Laterality Date  . Cesarean section    . Cardiac catheterization    . Esophageal manometry N/A 09/29/2013    Procedure: ESOPHAGEAL MANOMETRY (EM);  Surgeon: Milus Banister, MD;  Location: [facility];  Service: Endoscopy;  Laterality: N/A;    Allergies  Allergen Reactions  . Losartan Shortness Of Breath  . Augmentin [Amoxicillin-Pot Clavulanate]  Diarrhea  . Oxycodone-Acetaminophen Nausea And Vomiting    Social History   Social History  . Marital Status: Married    Spouse Name: N/A  . Number of Children: N/A  . Years of Education: N/A   Occupational History  . diabled    Social History Main Topics  . Smoking status: Never Smoker   . Smokeless tobacco: Never Used  . Alcohol Use: No  . Drug Use: No  . Sexual Activity: Not on file   Other Topics Concern  . Not on file   Social History Narrative    Family History  Problem Relation Age of Onset  . Diabetes Mother   . Diabetes Father   . Heart disease Mother     BP 141/78 mmHg  Pulse 73   Ht 5\' 5"  (1.651 m)  Wt 230 lb (104.327 kg)  BMI 38.27 kg/m2  LMP 05/20/2012  Review of Systems: See HPI above.    Objective:  Physical Exam:  Gen: NAD  Left shoulder: No swelling, ecchymoses.  No gross deformity. TTP distal clavicle, anterior shoulder. FROM with painful arc. Positive Hawkins, Neers. Negative Speeds, Yergasons. Strength 4/5 with empty can and 5/5 resisted internal/external rotation.  Pain empty can and ER. Negative apprehension. NV intact distally.    Assessment & Plan:  1. Left shoulder injury - consistent with rotator cuff strain.  Radiographs negative for fracture.  Not improving with diclofenac, home exercises.  Has not yet heard about physical therapy - going to resend referral.  Trial of subacromial injection given as well today.  Cntinue current restrictions.  F/u in 5-6 weeks.  After informed written consent, patient was seated on exam table. Left shoulder was prepped with alcohol swab and utilizing posterior approach, patient's left subacromial space was injected with 3:1 marcaine: depomedrol. Patient tolerated the procedure well without immediate complications.

## 2015-08-03 NOTE — Assessment & Plan Note (Signed)
consistent with rotator cuff strain.  Radiographs negative for fracture.  Not improving with diclofenac, home exercises.  Has not yet heard about physical therapy - going to resend referral.  Trial of subacromial injection given as well today.  Cntinue current restrictions.  F/u in 5-6 weeks.  After informed written consent, patient was seated on exam table. Left shoulder was prepped with alcohol swab and utilizing posterior approach, patient's left subacromial space was injected with 3:1 marcaine: depomedrol. Patient tolerated the procedure well without immediate complications.

## 2015-08-22 ENCOUNTER — Other Ambulatory Visit: Payer: Self-pay | Admitting: Family Medicine

## 2015-08-23 NOTE — Telephone Encounter (Signed)
Medication filled to pharmacy as requested.   

## 2015-08-26 ENCOUNTER — Emergency Department (HOSPITAL_COMMUNITY)
Admission: EM | Admit: 2015-08-26 | Discharge: 2015-08-27 | Disposition: A | Discharge status: Home / Self Care | Payer: Federal, State, Local not specified - PPO | Attending: Emergency Medicine | Admitting: Emergency Medicine

## 2015-08-26 ENCOUNTER — Encounter (HOSPITAL_COMMUNITY): Payer: Self-pay | Admitting: Emergency Medicine

## 2015-08-26 DIAGNOSIS — J45909 Unspecified asthma, uncomplicated: Secondary | ICD-10-CM | POA: Insufficient documentation

## 2015-08-26 DIAGNOSIS — Y9389 Activity, other specified: Secondary | ICD-10-CM | POA: Insufficient documentation

## 2015-08-26 DIAGNOSIS — X58XXXA Exposure to other specified factors, initial encounter: Secondary | ICD-10-CM | POA: Diagnosis not present

## 2015-08-26 DIAGNOSIS — Z9889 Other specified postprocedural states: Secondary | ICD-10-CM | POA: Insufficient documentation

## 2015-08-26 DIAGNOSIS — S39012A Strain of muscle, fascia and tendon of lower back, initial encounter: Secondary | ICD-10-CM | POA: Insufficient documentation

## 2015-08-26 DIAGNOSIS — Y998 Other external cause status: Secondary | ICD-10-CM | POA: Diagnosis not present

## 2015-08-26 DIAGNOSIS — Y9289 Other specified places as the place of occurrence of the external cause: Secondary | ICD-10-CM | POA: Insufficient documentation

## 2015-08-26 DIAGNOSIS — E119 Type 2 diabetes mellitus without complications: Secondary | ICD-10-CM | POA: Insufficient documentation

## 2015-08-26 DIAGNOSIS — I10 Essential (primary) hypertension: Secondary | ICD-10-CM | POA: Diagnosis not present

## 2015-08-26 DIAGNOSIS — R109 Unspecified abdominal pain: Secondary | ICD-10-CM | POA: Insufficient documentation

## 2015-08-26 DIAGNOSIS — H913 Deaf nonspeaking, not elsewhere classified: Secondary | ICD-10-CM | POA: Diagnosis not present

## 2015-08-26 DIAGNOSIS — Z7951 Long term (current) use of inhaled steroids: Secondary | ICD-10-CM | POA: Insufficient documentation

## 2015-08-26 DIAGNOSIS — Z791 Long term (current) use of non-steroidal anti-inflammatories (NSAID): Secondary | ICD-10-CM | POA: Insufficient documentation

## 2015-08-26 DIAGNOSIS — Z79899 Other long term (current) drug therapy: Secondary | ICD-10-CM | POA: Insufficient documentation

## 2015-08-26 DIAGNOSIS — M62830 Muscle spasm of back: Secondary | ICD-10-CM | POA: Insufficient documentation

## 2015-08-26 DIAGNOSIS — S3992XA Unspecified injury of lower back, initial encounter: Secondary | ICD-10-CM | POA: Diagnosis present

## 2015-08-26 LAB — CBC
HEMATOCRIT: 39.2 % (ref 36.0–46.0)
Hemoglobin: 12.5 g/dL (ref 12.0–15.0)
MCH: 27.4 pg (ref 26.0–34.0)
MCHC: 31.9 g/dL (ref 30.0–36.0)
MCV: 86 fL (ref 78.0–100.0)
PLATELETS: 201 10*3/uL (ref 150–400)
RBC: 4.56 MIL/uL (ref 3.87–5.11)
RDW: 14 % (ref 11.5–15.5)
WBC: 7.9 10*3/uL (ref 4.0–10.5)

## 2015-08-26 LAB — URINALYSIS, ROUTINE W REFLEX MICROSCOPIC
Glucose, UA: NEGATIVE mg/dL
HGB URINE DIPSTICK: NEGATIVE
Ketones, ur: NEGATIVE mg/dL
Leukocytes, UA: NEGATIVE
NITRITE: NEGATIVE
PROTEIN: NEGATIVE mg/dL
SPECIFIC GRAVITY, URINE: 1.036 — AB (ref 1.005–1.030)
UROBILINOGEN UA: 1 mg/dL (ref 0.0–1.0)
pH: 5.5 (ref 5.0–8.0)

## 2015-08-26 LAB — COMPREHENSIVE METABOLIC PANEL
ALBUMIN: 3.7 g/dL (ref 3.5–5.0)
ALT: 32 U/L (ref 14–54)
AST: 38 U/L (ref 15–41)
Alkaline Phosphatase: 86 U/L (ref 38–126)
Anion gap: 6 (ref 5–15)
BUN: 20 mg/dL (ref 6–20)
CHLORIDE: 107 mmol/L (ref 101–111)
CO2: 29 mmol/L (ref 22–32)
CREATININE: 1.03 mg/dL — AB (ref 0.44–1.00)
Calcium: 9.3 mg/dL (ref 8.9–10.3)
GFR calc Af Amer: >60 mL/min (ref >60)
GLUCOSE: 129 mg/dL — AB (ref 65–99)
POTASSIUM: 3.6 mmol/L (ref 3.5–5.1)
Sodium: 142 mmol/L (ref 135–145)
Total Bilirubin: 0.4 mg/dL (ref 0.3–1.2)
Total Protein: 7.4 g/dL (ref 6.5–8.1)

## 2015-08-26 NOTE — ED Notes (Signed)
Patient stated she is having right flank pain. Patient stated that she has been pooping more. Patient stated that she is having pain when urinating.

## 2015-08-26 NOTE — ED Notes (Signed)
Secretary notified interpreter. Patient waiting on interpreter.

## 2015-08-27 MED ORDER — TRAMADOL HCL 50 MG PO TABS: 50.0000 mg | ORAL_TABLET | Freq: Four times a day (QID) | ORAL | Status: DC | PRN

## 2015-08-27 MED ORDER — METHOCARBAMOL 500 MG PO TABS: 500.0000 mg | ORAL_TABLET | Freq: Two times a day (BID) | ORAL | Status: DC

## 2015-08-27 NOTE — Discharge Instructions (Signed)
Continue taking Diclofenac as prescribed by your doctor. Take Robaxin twice a day for muscle spasms. You may take Tramadol as needed for severe pain. Alternate ice and heat to your back 3-4 times per day for 15-20 minutes each time. Avoid strenuous activity or heavy lifting. Follow up with your primary care doctor on Tuesday.  Lumbosacral Strain Lumbosacral strain is a strain of any of the parts that make up your lumbosacral vertebrae. Your lumbosacral vertebrae are the bones that make up the lower third of your backbone. Your lumbosacral vertebrae are held together by muscles and tough, fibrous tissue (ligaments).  CAUSES  A sudden blow to your back can cause lumbosacral strain. Also, anything that causes an excessive stretch of the muscles in the low back can cause this strain. This is typically seen when people exert themselves strenuously, fall, lift heavy objects, bend, or crouch repeatedly. RISK FACTORS  Physically demanding work.  Participation in pushing or pulling sports or sports that require a sudden twist of the back (tennis, golf, baseball).  Weight lifting.  Excessive lower back curvature.  Forward-tilted pelvis.  Weak back or abdominal muscles or both.  Tight hamstrings. SIGNS AND SYMPTOMS  Lumbosacral strain may cause pain in the area of your injury or pain that moves (radiates) down your leg.  DIAGNOSIS Your health care provider can often diagnose lumbosacral strain through a physical exam. In some cases, you may need tests such as X-ray exams.  TREATMENT  Treatment for your lower back injury depends on many factors that your clinician will have to evaluate. However, most treatment will include the use of anti-inflammatory medicines. HOME CARE INSTRUCTIONS   Avoid hard physical activities (tennis, racquetball, waterskiing) if you are not in proper physical condition for it. This may aggravate or create problems.  If you have a back problem, avoid sports requiring  sudden body movements. Swimming and walking are generally safer activities.  Maintain good posture.  Maintain a healthy weight.  For acute conditions, you may put ice on the injured area.  Put ice in a plastic bag.  Place a towel between your skin and the bag.  Leave the ice on for 20 minutes, 2-3 times a day.  When the low back starts healing, stretching and strengthening exercises may be recommended. SEEK MEDICAL CARE IF:  Your back pain is getting worse.  You experience severe back pain not relieved with medicines. SEEK IMMEDIATE MEDICAL CARE IF:   You have numbness, tingling, weakness, or problems with the use of your arms or legs.  There is a change in bowel or bladder control.  You have increasing pain in any area of the body, including your belly (abdomen).  You notice shortness of breath, dizziness, or feel faint.  You feel sick to your stomach (nauseous), are throwing up (vomiting), or become sweaty.  You notice discoloration of your toes or legs, or your feet get very cold. MAKE SURE YOU:   Understand these instructions.  Will watch your condition.  Will get help right away if you are not doing well or get worse.   This information is not intended to replace advice given to you by your health care provider. Make sure you discuss any questions you have with your health care provider.   Document Released: 08/02/2005 Document Revised: 11/13/2014 Document Reviewed: 06/11/2013 Elsevier Interactive Patient Education [employer].

## 2015-08-27 NOTE — ED Provider Notes (Signed)
CSN: [id number]     Arrival date & time 08/26/15  2150 History   First MD Initiated Contact with Patient 08/27/15 0115     Chief Complaint  Patient presents with  . Flank Pain    right side     (Consider location/radiation/quality/duration/timing/severity/associated sxs/prior Treatment) HPI Comments: 54 year old female with a history of deafness, hypertension, and diabetes mellitus presents to the emergency department for further evaluation of right flank pain. Patient states that she has been having pain over the past week. Pain has become constant and has been worsening. Patient has been taking ibuprofen for symptoms without relief. She denies any history of similar symptoms as well as any associated trauma or injury to her back. Patient does report frequent repetitive movements at work. She states that she does some lifting, but has been on light duty since injury to her left shoulder. Patient works at the [employer]. She has noticed that her flank pain is worsened with ambulation and movement, especially when going from a seated to standing position. She denies any associated fever, chest pain, shortness of breath, nausea, vomiting, diarrhea, melena or hematochezia, dysuria, hematuria, or bowel/bladder incontinence. Abdominal surgical hx significant for C section. No hx of kidney stones.  Patient is a 54 y.o. female presenting with flank pain. The history is provided by the patient. A language interpreter was used (Sign language interpreter at bedside).  Flank Pain Pertinent negatives include no chest pain, fever, nausea, numbness, vomiting or weakness.    Past Medical History  Diagnosis Date  . Hypertension   . Deaf   . Asthma   . Thyroid disease   . Diabetes mellitus without complication [facility])    Past Surgical History  Procedure Laterality Date  . Cesarean section    . Cardiac catheterization    . Esophageal manometry N/A 09/29/2013    Procedure: ESOPHAGEAL MANOMETRY (EM);   Surgeon: Milus Banister, MD;  Location: [facility];  Service: Endoscopy;  Laterality: N/A;   Family History  Problem Relation Age of Onset  . Diabetes Mother   . Diabetes Father   . Heart disease Mother    Social History  Substance Use Topics  . Smoking status: Never Smoker   . Smokeless tobacco: Never Used  . Alcohol Use: No   OB History    No data available      Review of Systems  Constitutional: Negative for fever.  Respiratory: Negative for shortness of breath.   Cardiovascular: Negative for chest pain.  Gastrointestinal: Negative for nausea and vomiting.  Genitourinary: Positive for flank pain. Negative for dysuria and hematuria.  Musculoskeletal: Positive for back pain (R flank).  Neurological: Negative for weakness and numbness.  All other systems reviewed and are negative.   Allergies  Losartan; Augmentin; and Oxycodone-acetaminophen  Home Medications   Prior to Admission medications   Medication Sig Start Date End Date Taking? Authorizing Provider  albuterol (PROVENTIL HFA;VENTOLIN HFA) 108 (90 BASE) MCG/ACT inhaler Inhale 2 puffs into the lungs every 6 (six) hours as needed for wheezing. 04/16/15   Brunetta Jeans, PA-C  amLODipine (NORVASC) 5 MG tablet TAKE 1 TABLET (5 MG TOTAL) BY MOUTH DAILY. 08/23/15   Midge Minium, MD  cetirizine (ZYRTEC) 10 MG tablet TAKE 1 TABLET BY MOUTH EVERY DAY 07/13/15   Midge Minium, MD  diclofenac (VOLTAREN) 75 MG EC tablet Take 1 tablet (75 mg total) by mouth 2 (two) times daily. 06/24/15   Dene Gentry, MD  famotidine (PEPCID) 20  MG tablet Take 20 mg by mouth at bedtime.    Historical Provider, MD  Fiber CHEW Chew 1 tablet by mouth daily.    Historical Provider, MD  fluticasone (FLONASE) 50 MCG/ACT nasal spray PLACE 2 SPRAYS INTO BOTH NOSTRILS DAILY. 06/09/15   Midge Minium, MD  hydrochlorothiazide (HYDRODIURIL) 12.5 MG tablet Take 1 tablet (12.5 mg total) by mouth daily. 03/08/15   Midge Minium, MD    meclizine (ANTIVERT) 25 MG tablet Take 1 tablet (25 mg total) by mouth 3 (three) times daily as needed. Patient not taking: Reported on 06/16/2015 01/30/13   Midge Minium, MD  metFORMIN (GLUCOPHAGE-XR) 500 MG 24 hr tablet TAKE 1 TABLET (500 MG TOTAL) BY MOUTH DAILY WITH BREAKFAST. 03/08/15   Midge Minium, MD  methocarbamol (ROBAXIN) 500 MG tablet Take 1 tablet (500 mg total) by mouth 2 (two) times daily. 08/27/15   Antonietta Breach, PA-C  mometasone-formoterol (DULERA) 100-5 MCG/ACT AERO Inhale 2 puffs into the lungs 2 (two) times daily. 04/16/15   Brunetta Jeans, PA-C  Multiple Vitamin (MULTIVITAMIN) tablet Take 1 tablet by mouth daily.    Historical Provider, MD  naproxen sodium (ANAPROX) 220 MG tablet Take 220 mg by mouth every 6 (six) hours as needed.    Historical Provider, MD  pantoprazole (PROTONIX) 40 MG tablet Take 1 tablet (40 mg total) by mouth daily. Patient not taking: Reported on 06/16/2015 08/18/14   Midge Minium, MD  potassium chloride SA (K-DUR,KLOR-CON) 20 MEQ tablet Take 20 mEq by mouth daily.    Historical Provider, MD  traMADol (ULTRAM) 50 MG tablet Take 1 tablet (50 mg total) by mouth every 6 (six) hours as needed for severe pain. 08/27/15   Antonietta Breach, PA-C  Vitamin D, Ergocalciferol, (DRISDOL) 50000 UNITS CAPS capsule Take 1 capsule (50,000 Units total) by mouth every 7 (seven) days. 07/01/15   Midge Minium, MD   BP 155/53 mmHg  Pulse 79  Temp(Src) 97.9 F (36.6 C) (Oral)  Resp 18  Ht 5\' 4"  (1.626 m)  Wt 234 lb (106.142 kg)  BMI 40.15 kg/m2  SpO2 98%  LMP 05/20/2012   Physical Exam  Constitutional: She is oriented to person, place, and time. She appears well-developed and well-nourished. No distress.  Nontoxic/nonseptic appearing  HENT:  Head: Normocephalic and atraumatic.  Eyes: Conjunctivae and EOM are normal. No scleral icterus.  Neck: Normal range of motion.  Cardiovascular: Normal rate, regular rhythm and intact distal pulses.   DP and PT  pulses 2+ b/l  Pulmonary/Chest: Effort normal. No respiratory distress.  Respirations even and unlabored  Abdominal: Soft. There is no rebound and no guarding.  Soft, obese abdomen. No masses or peritoneal signs.  Musculoskeletal: Normal range of motion. She exhibits tenderness.       Thoracic back: Normal.       Lumbar back: She exhibits tenderness, pain and spasm. She exhibits no bony tenderness, no deformity and normal pulse.       Back:  Positive straight leg raise. Negative crossed straight leg raise. TTP noted to the lumbar paraspinal muscles on the right with appreciable spasm. No TTP to the thoracic or lumbar midline. No bony deformities, step offs, or crepitus.  Neurological: She is alert and oriented to person, place, and time. She exhibits normal muscle tone. Coordination normal.  Sensation to light touch intact in BLE. Patient moving all extremities.  Skin: Skin is warm and dry. No rash noted. She is not diaphoretic. No erythema. No pallor.  Psychiatric: She has a normal mood and affect. Her behavior is normal.  Nursing note and vitals reviewed.   ED Course  Procedures (including critical care time) Labs Review Labs Reviewed  URINALYSIS, ROUTINE W REFLEX MICROSCOPIC (NOT AT [facility]) - Abnormal; Notable for the following:    Color, Urine AMBER (*)    APPearance CLOUDY (*)    Specific Gravity, Urine 1.036 (*)    Bilirubin Urine SMALL (*)    All other components within normal limits  COMPREHENSIVE METABOLIC PANEL - Abnormal; Notable for the following:    Glucose, Bld 129 (*)    Creatinine, Ser 1.03 (*)    All other components within normal limits  CBC    Imaging Review No results found.   I have personally reviewed and evaluated these images and lab results as part of my medical decision-making.   EKG Interpretation None      MDM   Final diagnoses:  Low back strain, initial encounter  Muscle spasm of back    54 year old female presents to the emergency  department for further evaluation of right flank pain x 1 week. Symptoms consistent with MSK etiology. Pain is reproducible on palpation to the right lumbar paraspinal muscles. Spasm appreciated at site of pain. Patient neurovascularly intact. No red flags or signs concerning for cauda equina. Laboratory workup is noncontributory. No signs of acute surgical abdomen.  Will manage symptoms as outpatient with continued use of NSAIDs. Patient given additional prescriptions for Robaxin and tramadol. Supportive care with ice and heat pads advised as well as primary care follow-up in one week for recheck. Return precautions discussed and provided. Patient agreeable to plan with no unaddressed concerns. Patient discharged in good condition.   Filed Vitals:   08/26/15 2202  BP: 155/53  Pulse: 79  Temp: 97.9 F (36.6 C)  TempSrc: Oral  Resp: 18  Height: 5\' 4"  (1.626 m)  Weight: 234 lb (106.142 kg)  SpO2: 98%     Antonietta Breach, PA-C 08/27/15 2500  Julianne Rice, MD 08/28/15 [phone]

## 2015-09-01 ENCOUNTER — Emergency Department (HOSPITAL_BASED_OUTPATIENT_CLINIC_OR_DEPARTMENT_OTHER): Payer: Federal, State, Local not specified - PPO

## 2015-09-01 ENCOUNTER — Ambulatory Visit (INDEPENDENT_AMBULATORY_CARE_PROVIDER_SITE_OTHER): Payer: Federal, State, Local not specified - PPO | Admitting: Family Medicine

## 2015-09-01 ENCOUNTER — Encounter: Payer: Self-pay | Admitting: Family Medicine

## 2015-09-01 ENCOUNTER — Encounter (HOSPITAL_BASED_OUTPATIENT_CLINIC_OR_DEPARTMENT_OTHER): Payer: Self-pay | Admitting: Emergency Medicine

## 2015-09-01 ENCOUNTER — Emergency Department (HOSPITAL_BASED_OUTPATIENT_CLINIC_OR_DEPARTMENT_OTHER)
Admission: EM | Admit: 2015-09-01 | Discharge: 2015-09-01 | Disposition: A | Discharge status: Home / Self Care | Payer: Federal, State, Local not specified - PPO | Attending: Emergency Medicine | Admitting: Emergency Medicine

## 2015-09-01 VITALS — BP 162/76 | HR 96 | Temp 97.6°F | Resp 16 | Ht 64.0 in | Wt 238.4 lb

## 2015-09-01 DIAGNOSIS — Z7951 Long term (current) use of inhaled steroids: Secondary | ICD-10-CM | POA: Diagnosis not present

## 2015-09-01 DIAGNOSIS — Z79899 Other long term (current) drug therapy: Secondary | ICD-10-CM | POA: Diagnosis not present

## 2015-09-01 DIAGNOSIS — J45901 Unspecified asthma with (acute) exacerbation: Secondary | ICD-10-CM | POA: Insufficient documentation

## 2015-09-01 DIAGNOSIS — R109 Unspecified abdominal pain: Secondary | ICD-10-CM

## 2015-09-01 DIAGNOSIS — R112 Nausea with vomiting, unspecified: Secondary | ICD-10-CM | POA: Diagnosis not present

## 2015-09-01 DIAGNOSIS — R100 Acute abdomen: Secondary | ICD-10-CM | POA: Diagnosis not present

## 2015-09-01 DIAGNOSIS — H9190 Unspecified hearing loss, unspecified ear: Secondary | ICD-10-CM | POA: Diagnosis not present

## 2015-09-01 DIAGNOSIS — R1084 Generalized abdominal pain: Secondary | ICD-10-CM | POA: Insufficient documentation

## 2015-09-01 DIAGNOSIS — I10 Essential (primary) hypertension: Secondary | ICD-10-CM | POA: Diagnosis not present

## 2015-09-01 DIAGNOSIS — E119 Type 2 diabetes mellitus without complications: Secondary | ICD-10-CM | POA: Insufficient documentation

## 2015-09-01 LAB — CBC WITH DIFFERENTIAL/PLATELET
BASOS ABS: 0 10*3/uL (ref 0.0–0.1)
BASOS PCT: 0 %
EOS ABS: 0.2 10*3/uL (ref 0.0–0.7)
Eosinophils Relative: 3 %
HEMATOCRIT: 44.3 % (ref 36.0–46.0)
Hemoglobin: 14.4 g/dL (ref 12.0–15.0)
Lymphocytes Relative: 34 %
Lymphs Abs: 2.1 10*3/uL (ref 0.7–4.0)
MCH: 27.4 pg (ref 26.0–34.0)
MCHC: 32.5 g/dL (ref 30.0–36.0)
MCV: 84.4 fL (ref 78.0–100.0)
MONO ABS: 0.8 10*3/uL (ref 0.1–1.0)
MONOS PCT: 12 %
NEUTROS ABS: 3.1 10*3/uL (ref 1.7–7.7)
Neutrophils Relative %: 51 %
PLATELETS: 214 10*3/uL (ref 150–400)
RBC: 5.25 MIL/uL — ABNORMAL HIGH (ref 3.87–5.11)
RDW: 13.9 % (ref 11.5–15.5)
WBC: 6.2 10*3/uL (ref 4.0–10.5)

## 2015-09-01 LAB — COMPREHENSIVE METABOLIC PANEL
ALK PHOS: 66 U/L (ref 38–126)
ALT: 34 U/L (ref 14–54)
ANION GAP: 6 (ref 5–15)
AST: 37 U/L (ref 15–41)
Albumin: 3.8 g/dL (ref 3.5–5.0)
BILIRUBIN TOTAL: 0.6 mg/dL (ref 0.3–1.2)
BUN: 14 mg/dL (ref 6–20)
CALCIUM: 9.4 mg/dL (ref 8.9–10.3)
CO2: 30 mmol/L (ref 22–32)
Chloride: 104 mmol/L (ref 101–111)
Creatinine, Ser: 0.9 mg/dL (ref 0.44–1.00)
GFR calc Af Amer: >60 mL/min (ref >60)
Glucose, Bld: 118 mg/dL — ABNORMAL HIGH (ref 65–99)
POTASSIUM: 3.5 mmol/L (ref 3.5–5.1)
Sodium: 140 mmol/L (ref 135–145)
TOTAL PROTEIN: 7.7 g/dL (ref 6.5–8.1)

## 2015-09-01 LAB — LIPASE, BLOOD: LIPASE: 20 U/L (ref 11–51)

## 2015-09-01 LAB — BRAIN NATRIURETIC PEPTIDE: B NATRIURETIC PEPTIDE 5: 25.3 pg/mL (ref 0.0–100.0)

## 2015-09-01 LAB — URINALYSIS, ROUTINE W REFLEX MICROSCOPIC
BILIRUBIN URINE: NEGATIVE
Glucose, UA: NEGATIVE mg/dL
Hgb urine dipstick: NEGATIVE
Ketones, ur: NEGATIVE mg/dL
Leukocytes, UA: NEGATIVE
NITRITE: NEGATIVE
Protein, ur: 30 mg/dL — AB
SPECIFIC GRAVITY, URINE: 1.023 (ref 1.005–1.030)
Urobilinogen, UA: 1 mg/dL (ref 0.0–1.0)
pH: 6.5 (ref 5.0–8.0)

## 2015-09-01 LAB — URINE MICROSCOPIC-ADD ON

## 2015-09-01 LAB — TROPONIN I

## 2015-09-01 LAB — D-DIMER, QUANTITATIVE (NOT AT ARMC): D DIMER QUANT: 0.33 ug{FEU}/mL (ref 0.00–0.48)

## 2015-09-01 MED ORDER — METOCLOPRAMIDE HCL 5 MG/ML IJ SOLN
10.0000 mg | Freq: Once | INTRAMUSCULAR | Status: AC
  Administered 2015-09-01: 10 mg via INTRAVENOUS
  Filled 2015-09-01: qty 2

## 2015-09-01 MED ORDER — HYDROMORPHONE HCL 1 MG/ML IJ SOLN
1.0000 mg | Freq: Once | INTRAMUSCULAR | Status: AC
  Administered 2015-09-01: 1 mg via INTRAVENOUS
  Filled 2015-09-01: qty 1

## 2015-09-01 MED ORDER — IOHEXOL 300 MG/ML  SOLN
100.0000 mL | Freq: Once | INTRAMUSCULAR | Status: AC | PRN
  Administered 2015-09-01: 100 mL via INTRAVENOUS

## 2015-09-01 MED ORDER — IOHEXOL 300 MG/ML  SOLN
25.0000 mL | Freq: Once | INTRAMUSCULAR | Status: AC | PRN
  Administered 2015-09-01: 25 mL via ORAL

## 2015-09-01 NOTE — ED Provider Notes (Signed)
CSN: [id number]     Arrival date & time 09/01/15  4656 History   First MD Initiated Contact with Patient 09/01/15 [phone]     Chief Complaint  Patient presents with  . Abdominal Pain   Patient deaf.; History is obtained from patient using professional interpreter  (Consider location/radiation/quality/duration/timing/severity/associated sxs/prior Treatment) HPI She complains of right flank pain rating to abdomen onset 5 days ago. Patient seen at [facility] emergency department 08/28/2015 determined to have musculoskeletal pain. She was started on NSAIDs, tramadol and Robaxin which she's taken, without relief. Pain is worse with movement, accompanied by nausea and vomiting. She was seen by Dr.Tabouri this morning. And she was short of breath and could not be examined properly, sent here for further evaluation. Patient reports she treated herself with her inhaler this morning with improvement of her breathing.. No fever. No other associated symptoms Past Medical History  Diagnosis Date  . Hypertension   . Deaf   . Asthma   . Thyroid disease   . Diabetes mellitus without complication [facility])    Past Surgical History  Procedure Laterality Date  . Cesarean section    . Cardiac catheterization    . Esophageal manometry N/A 09/29/2013    Procedure: ESOPHAGEAL MANOMETRY (EM);  Surgeon: Milus Banister, MD;  Location: [facility];  Service: Endoscopy;  Laterality: N/A;   Family History  Problem Relation Age of Onset  . Diabetes Mother   . Diabetes Father   . Heart disease Mother    Social History  Substance Use Topics  . Smoking status: Never Smoker   . Smokeless tobacco: Never Used  . Alcohol Use: No   OB History    No data available     Review of Systems  HENT: Positive for hearing loss.   Respiratory: Positive for shortness of breath.   Gastrointestinal: Positive for vomiting and abdominal pain.  Genitourinary: Positive for flank pain.  Allergic/Immunologic: Positive for  immunocompromised state.       Diabetic  All other systems reviewed and are negative.     Allergies  Losartan; Augmentin; and Oxycodone-acetaminophen  Home Medications   Prior to Admission medications   Medication Sig Start Date End Date Taking? Authorizing Provider  albuterol (PROVENTIL HFA;VENTOLIN HFA) 108 (90 BASE) MCG/ACT inhaler Inhale 2 puffs into the lungs every 6 (six) hours as needed for wheezing. 04/16/15   Brunetta Jeans, PA-C  amLODipine (NORVASC) 5 MG tablet TAKE 1 TABLET (5 MG TOTAL) BY MOUTH DAILY. 08/23/15   Midge Minium, MD  cetirizine (ZYRTEC) 10 MG tablet TAKE 1 TABLET BY MOUTH EVERY DAY 07/13/15   Midge Minium, MD  diclofenac (VOLTAREN) 75 MG EC tablet Take 1 tablet (75 mg total) by mouth 2 (two) times daily. 06/24/15   Dene Gentry, MD  famotidine (PEPCID) 20 MG tablet Take 20 mg by mouth at bedtime.    Historical Provider, MD  Fiber CHEW Chew 1 tablet by mouth daily.    Historical Provider, MD  fluticasone (FLONASE) 50 MCG/ACT nasal spray PLACE 2 SPRAYS INTO BOTH NOSTRILS DAILY. 06/09/15   Midge Minium, MD  hydrochlorothiazide (HYDRODIURIL) 12.5 MG tablet Take 1 tablet (12.5 mg total) by mouth daily. 03/08/15   Midge Minium, MD  meclizine (ANTIVERT) 25 MG tablet Take 1 tablet (25 mg total) by mouth 3 (three) times daily as needed. Patient not taking: Reported on 06/16/2015 01/30/13   Midge Minium, MD  metFORMIN (GLUCOPHAGE-XR) 500 MG 24 hr tablet TAKE 1  TABLET (500 MG TOTAL) BY MOUTH DAILY WITH BREAKFAST. 03/08/15   Midge Minium, MD  methocarbamol (ROBAXIN) 500 MG tablet Take 1 tablet (500 mg total) by mouth 2 (two) times daily. 08/27/15   Antonietta Breach, PA-C  mometasone-formoterol (DULERA) 100-5 MCG/ACT AERO Inhale 2 puffs into the lungs 2 (two) times daily. 04/16/15   Brunetta Jeans, PA-C  Multiple Vitamin (MULTIVITAMIN) tablet Take 1 tablet by mouth daily.    Historical Provider, MD  naproxen sodium (ANAPROX) 220 MG tablet Take 220 mg  by mouth every 6 (six) hours as needed.    Historical Provider, MD  pantoprazole (PROTONIX) 40 MG tablet Take 1 tablet (40 mg total) by mouth daily. Patient not taking: Reported on 06/16/2015 08/18/14   Midge Minium, MD  potassium chloride SA (K-DUR,KLOR-CON) 20 MEQ tablet Take 20 mEq by mouth daily.    Historical Provider, MD  traMADol (ULTRAM) 50 MG tablet Take 1 tablet (50 mg total) by mouth every 6 (six) hours as needed for severe pain. 08/27/15   Antonietta Breach, PA-C  Vitamin D, Ergocalciferol, (DRISDOL) 50000 UNITS CAPS capsule Take 1 capsule (50,000 Units total) by mouth every 7 (seven) days. 07/01/15   Midge Minium, MD   BP 101/76 mmHg  Pulse 77  Temp(Src) 98 F (36.7 C) (Oral)  Resp 20  Wt 238 lb (107.956 kg)  SpO2 100%  LMP 05/20/2012 Physical Exam  Constitutional: She appears well-developed and well-nourished. She appears distressed.  Marland Kitchen Uncomfortable  HENT:  Head: Normocephalic and atraumatic.  Eyes: Conjunctivae are normal. Pupils are equal, round, and reactive to light.  Neck: Neck supple. No tracheal deviation present. No thyromegaly present.  Cardiovascular: Normal rate and regular rhythm.   No murmur heard. Pulmonary/Chest: Effort normal and breath sounds normal.  Abdominal: Soft. Bowel sounds are normal. She exhibits no distension. There is no tenderness.  Morbidly obese, mild diffuse tenderness  Genitourinary:  No flank tenderness  Musculoskeletal: Normal range of motion. She exhibits no edema or tenderness.  Neurological: She is alert. Coordination normal.  Skin: Skin is warm and dry. No rash noted.  Psychiatric: She has a normal mood and affect.  Nursing note and vitals reviewed.   ED Course  Procedures (including critical care time) Labs Review Labs Reviewed - No data to display  Imaging Review No results found. I have personally reviewed and evaluated these images and lab results as part of my medical decision-making.   EKG  Interpretation   Date/Time:  Wednesday September 01 2015 10:45:03 EDT Ventricular Rate:  61 PR Interval:  162 QRS Duration: 94 QT Interval:  422 QTC Calculation: 424 R Axis:   -15 Text Interpretation:  Normal sinus rhythm Normal ECG No significant change  since last tracing Confirmed by Scarlette Hogston  MD, Charls Custer ([id number]) on 09/01/2015  10:50:15 AM     10:35 AM patient is resting comfortably in bed. Pain is well controlled after treatment with intravenous hydromorphone, nausea has resolved after treatment with intravenous Reglan. Denies shortness of breath  Results for orders placed or performed during the hospital encounter of 09/01/15  Comprehensive metabolic panel  Result Value Ref Range   Sodium 140 135 - 145 mmol/L   Potassium 3.5 3.5 - 5.1 mmol/L   Chloride 104 101 - 111 mmol/L   CO2 30 22 - 32 mmol/L   Glucose, Bld 118 (H) 65 - 99 mg/dL   BUN 14 6 - 20 mg/dL   Creatinine, Ser 0.90 0.44 - 1.00 mg/dL   Calcium  9.4 8.9 - 10.3 mg/dL   Total Protein 7.7 6.5 - 8.1 g/dL   Albumin 3.8 3.5 - 5.0 g/dL   AST 37 15 - 41 U/L   ALT 34 14 - 54 U/L   Alkaline Phosphatase 66 38 - 126 U/L   Total Bilirubin 0.6 0.3 - 1.2 mg/dL   GFR calc non Af Amer >60 >60 mL/min   GFR calc Af Amer >60 >60 mL/min   Anion gap 6 5 - 15  Troponin I  Result Value Ref Range   Troponin I <0.03 <0.031 ng/mL  CBC with Differential/Platelet  Result Value Ref Range   WBC 6.2 4.0 - 10.5 K/uL   RBC 5.25 (H) 3.87 - 5.11 MIL/uL   Hemoglobin 14.4 12.0 - 15.0 g/dL   HCT 44.3 36.0 - 46.0 %   MCV 84.4 78.0 - 100.0 fL   MCH 27.4 26.0 - 34.0 pg   MCHC 32.5 30.0 - 36.0 g/dL   RDW 13.9 11.5 - 15.5 %   Platelets 214 150 - 400 K/uL   Neutrophils Relative % 51 %   Neutro Abs 3.1 1.7 - 7.7 K/uL   Lymphocytes Relative 34 %   Lymphs Abs 2.1 0.7 - 4.0 K/uL   Monocytes Relative 12 %   Monocytes Absolute 0.8 0.1 - 1.0 K/uL   Eosinophils Relative 3 %   Eosinophils Absolute 0.2 0.0 - 0.7 K/uL   Basophils Relative 0 %    Basophils Absolute 0.0 0.0 - 0.1 K/uL  Lipase, blood  Result Value Ref Range   Lipase 20 11 - 51 U/L  D-dimer, quantitative (not at [facility])  Result Value Ref Range   D-Dimer, Quant 0.33 0.00 - 0.48 ug/mL-FEU  Brain natriuretic peptide  Result Value Ref Range   B Natriuretic Peptide 25.3 0.0 - 100.0 pg/mL  Urinalysis, Routine w reflex microscopic (not at [facility])  Result Value Ref Range   Color, Urine YELLOW YELLOW   APPearance CLEAR CLEAR   Specific Gravity, Urine 1.023 1.005 - 1.030   pH 6.5 5.0 - 8.0   Glucose, UA NEGATIVE NEGATIVE mg/dL   Hgb urine dipstick NEGATIVE NEGATIVE   Bilirubin Urine NEGATIVE NEGATIVE   Ketones, ur NEGATIVE NEGATIVE mg/dL   Protein, ur 30 (A) NEGATIVE mg/dL   Urobilinogen, UA 1.0 0.0 - 1.0 mg/dL   Nitrite NEGATIVE NEGATIVE   Leukocytes, UA NEGATIVE NEGATIVE  Urine microscopic-add on  Result Value Ref Range   Squamous Epithelial / LPF RARE RARE   WBC, UA 0-2 <3 WBC/hpf   RBC / HPF 0-2 <3 RBC/hpf   Bacteria, UA MANY (A) RARE   Casts HYALINE CASTS (A) NEGATIVE   Urine-Other MUCOUS PRESENT    Dg Chest 2 View  09/01/2015  CLINICAL DATA:  Shortness of breath. EXAM: CHEST  2 VIEW COMPARISON:  September 02, 2014. FINDINGS: Stable cardiomegaly. No pneumothorax or pleural effusion is noted. No acute pulmonary disease is noted. Bony thorax is intact. IMPRESSION: No active cardiopulmonary disease. Electronically Signed   By: Marijo Conception, M.D.   On: 09/01/2015 11:51   Ct Abdomen Pelvis W Contrast  09/01/2015  CLINICAL DATA:  54 year old female with abdominal pain for 10 days, vomiting. Initial encounter. EXAM: CT ABDOMEN AND PELVIS WITH CONTRAST TECHNIQUE: Multidetector CT imaging of the abdomen and pelvis was performed using the standard protocol following bolus administration of intravenous contrast. CONTRAST:  20mL OMNIPAQUE IOHEXOL 300 MG/ML SOLN, 119mL OMNIPAQUE IOHEXOL 300 MG/ML SOLN COMPARISON:  Pelvis CT 12/30/2012. FINDINGS: Large body habitus.  Cardiomegaly. No pericardial effusion. Atelectasis at the lung bases. Degenerative changes at the right hip with subchondral cysts. No acute osseous abnormality identified. No pelvic free fluid. Negative uterus and adnexa. Negative rectum. Negative sigmoid colon. Diminutive urinary bladder. Gas and stool in the more proximal colon which otherwise negative. Negative appendix. Negative terminal ileum. No dilated small bowel, oral contrast has not yet reached the distal small bowel. Negative stomach and duodenum. No abdominal free air or free fluid. Liver, spleen, pancreas and adrenal glands are within normal limits. The portal venous system is patent. Major arterial structures are patent. The gallbladder appears distended (5.5 cm diameter) but not inflamed. No lymphadenopathy. Bilateral renal enhancement and contrast excretion within normal limits. IMPRESSION: 1. No inflammatory changes identified in the abdomen or pelvis. Negative appendix. The gallbladder does appear somewhat distended. 2. Cardiomegaly. Electronically Signed   By: Genevie Ann M.D.   On: 09/01/2015 11:57    1:10 PM patient is resting comfortably. She states pain is minimal. MDM  Patient feels that tramadol and Robaxin prescribed to her at her last visit were causing her symptoms. She is comfortable taking Aleve . I am in agreement Plan follow-up with Dr.Tabori as needed Final diagnoses:  None   Dx #1right flank pain #2 abdominal pain #3dyspnea     Orlie Dakin, MD 09/01/15 1314

## 2015-09-01 NOTE — ED Notes (Signed)
Pt had two small episodes of emesis after standing up and getting dressed, emesis was light yellow and clear. Discussed with pt that nausea/emesis after administration of contrast media and narcotic pain medicine on an empty stomach is common. Pt verbalizes understanding and states she feels better shortly after emesis and she feels safe to go home. I provided her with strict return precautions should she continue to have uncontrolled emesis after departure.

## 2015-09-01 NOTE — Progress Notes (Signed)
   Subjective:    Patient ID: Rhonda Morrow, female    DOB: 1961/08/02, 54 y.o.   MRN: [id number]  HPI abd pain- pt reports she has not had BM since Friday.  Went to ER on 10/20 and dx'd w/ low back pain, started on Robaxin.  All labs WNL.  Pt is unable to provide hx due to severe pain.  Reports subjective fever.  + nausea, no vomiting since Saturday.  Pt reports she is hot, flushed, dizzy, nauseated.  She is unable to lie down for an exam- when she attempted to lie flat she became distressed and stated she couldn't breathe.   Review of Systems For ROS see HPI     Objective:   Physical Exam  Constitutional: She is oriented to person, place, and time. She appears well-developed and well-nourished. She appears distressed.  HENT:  Head: Normocephalic and atraumatic.  Cardiovascular: Normal rate and regular rhythm.   Pulmonary/Chest: Effort normal and breath sounds normal. No respiratory distress. She has no wheezes. She has no rales.  Abdominal: Soft. She exhibits no distension. There is tenderness (diffuse abd discomfort). There is no rebound and no guarding.  Neurological: She is alert and oriented to person, place, and time.  Skin: Skin is warm and dry.  Psychiatric:  Anxious, tearful  Vitals reviewed.         Assessment & Plan:

## 2015-09-01 NOTE — ED Notes (Addendum)
Pt reports continued abdominal pain, pain started last Saturday and is getting worse. Pt was seen at [facility] on 10/[facility], started on robaxin for lower back pain and has been taking medications since then. Pt reports that this pain has gotten significantly worse since starting this medication

## 2015-09-01 NOTE — Progress Notes (Signed)
Pre visit review using our clinic review tool, if applicable. No additional management support is needed unless otherwise documented below in the visit note. 

## 2015-09-01 NOTE — Discharge Instructions (Signed)
Flank Pain Don't take any more Robaxin or tramadol prescribed to you at your last visit in the emergency department. Take Aleve as directed for pain. See Dr.Tabori if your pain does not continue to improve by next week. Return if your condition worsens for any reason. Flank pain refers to pain that is located on the side of the body between the upper abdomen and the back. The pain may occur over a short period of time (acute) or may be long-term or reoccurring (chronic). It may be mild or severe. Flank pain can be caused by many things. CAUSES  Some of the more common causes of flank pain include:  Muscle strains.   Muscle spasms.   A disease of your spine (vertebral disk disease).   A lung infection (pneumonia).   Fluid around your lungs (pulmonary edema).   A kidney infection.   Kidney stones.   A very painful skin rash caused by the chickenpox virus (shingles).   Gallbladder disease.  [place] care will depend on the cause of your pain. In general,  Rest as directed by your caregiver.  Drink enough fluids to keep your urine clear or pale yellow.  Only take over-the-counter or prescription medicines as directed by your caregiver. Some medicines may help relieve the pain.  Tell your caregiver about any changes in your pain.  Follow up with your caregiver as directed. SEEK IMMEDIATE MEDICAL CARE IF:   Your pain is not controlled with medicine.   You have new or worsening symptoms.  Your pain increases.   You have abdominal pain.   You have shortness of breath.   You have persistent nausea or vomiting.   You have swelling in your abdomen.   You feel faint or pass out.   You have blood in your urine.  You have a fever or persistent symptoms for more than 2-3 days.  You have a fever and your symptoms suddenly get worse. MAKE SURE YOU:   Understand these instructions.  Will watch your condition.  Will get help right away  if you are not doing well or get worse.   This information is not intended to replace advice given to you by your health care provider. Make sure you discuss any questions you have with your health care provider.   Document Released: 12/14/2005 Document Revised: 07/17/2012 Document Reviewed: 06/06/2012 Elsevier Interactive Patient Education [employer].

## 2015-09-01 NOTE — Assessment & Plan Note (Signed)
New.  Pt's sxs are severe in office- making an exam and hx impossible to obtain due to her acute distress.  Pt reports nausea, dizziness, flushing, severe abd spasm/cramping.  When pt attempted to lie flat, she became acutely short of breath.  Based on her current condition, I am unable to assess or appropriately evaluate pt- requiring transport to ER.  Pt and husband are agreeable to this plan.  Called and spoke w/ Dr Cathleen Fears (ED attending) and he is aware pt is coming.

## 2015-09-04 ENCOUNTER — Other Ambulatory Visit: Payer: Self-pay | Admitting: Family Medicine

## 2015-09-06 NOTE — Telephone Encounter (Signed)
Medication filled to pharmacy as requested.   

## 2015-09-09 ENCOUNTER — Ambulatory Visit: Admitting: Family Medicine

## 2015-09-13 ENCOUNTER — Encounter: Payer: Self-pay | Admitting: Family Medicine

## 2015-09-13 ENCOUNTER — Ambulatory Visit (INDEPENDENT_AMBULATORY_CARE_PROVIDER_SITE_OTHER): Admitting: Family Medicine

## 2015-09-13 VITALS — BP 134/66 | HR 73 | Ht 64.0 in | Wt 233.0 lb

## 2015-09-13 DIAGNOSIS — M67912 Unspecified disorder of synovium and tendon, left shoulder: Secondary | ICD-10-CM | POA: Diagnosis not present

## 2015-09-14 NOTE — Progress Notes (Signed)
PCP: Annye Asa, MD Referred by Dr. Larose Kells  Subjective:   HPI: Patient is a 54 y.o. female here for left shoulder injury.  8/18: Interpreter used throughout visit. Patient reports injury occurred on 7/29 at work. She drives a forklift at work. Felt a pop in left shoulder while doing so - mentioned some of equipment was broken and she did not realize this. Seemed to be mild but later that day when doing sign language she felt a lot of pain in the left shoulder. Pain currently 7/10. Tried aleve, advil, icing. Has not had radiographs. + night pain. Is right handed. Remotely  Had problems with this joint - had an injection and completely improved.  9/22: Interpreter present for visit. Patient reports pain still 6/10 at work, 5/10 at rest. Has not heard yet about physical therapy. No other changes.  11/7: Interpreter present. Unfortunately patient still has not heard about physical therapy. Pain level 5/10. No skin changes, fever, other complaints. Using pain pills, occasional ice. Doing home exercises.  Past Medical History  Diagnosis Date  . Hypertension   . Deaf   . Asthma   . Thyroid disease   . Diabetes mellitus without complication [facility])     Current Outpatient Prescriptions on File Prior to Visit  Medication Sig Dispense Refill  . albuterol (PROVENTIL HFA;VENTOLIN HFA) 108 (90 BASE) MCG/ACT inhaler Inhale 2 puffs into the lungs every 6 (six) hours as needed for wheezing. 1 Inhaler 1  . amLODipine (NORVASC) 5 MG tablet TAKE 1 TABLET (5 MG TOTAL) BY MOUTH DAILY. 30 tablet 6  . cetirizine (ZYRTEC) 10 MG tablet TAKE 1 TABLET BY MOUTH EVERY DAY 30 tablet 4  . diclofenac (VOLTAREN) 75 MG EC tablet Take 1 tablet (75 mg total) by mouth 2 (two) times daily. 60 tablet 1  . famotidine (PEPCID) 20 MG tablet Take 20 mg by mouth at bedtime.    . Fiber CHEW Chew 1 tablet by mouth daily.    . fluticasone (FLONASE) 50 MCG/ACT nasal spray PLACE 2 SPRAYS INTO BOTH NOSTRILS DAILY.  16 g 4  . hydrochlorothiazide (HYDRODIURIL) 12.5 MG tablet Take 1 tablet (12.5 mg total) by mouth daily. 90 tablet 1  . meclizine (ANTIVERT) 25 MG tablet Take 1 tablet (25 mg total) by mouth 3 (three) times daily as needed. 45 tablet 3  . metFORMIN (GLUCOPHAGE-XR) 500 MG 24 hr tablet TAKE 1 TABLET (500 MG TOTAL) BY MOUTH DAILY WITH BREAKFAST. 90 tablet 1  . methocarbamol (ROBAXIN) 500 MG tablet Take 1 tablet (500 mg total) by mouth 2 (two) times daily. 20 tablet 0  . mometasone-formoterol (DULERA) 100-5 MCG/ACT AERO Inhale 2 puffs into the lungs 2 (two) times daily. 8.8 g 3  . Multiple Vitamin (MULTIVITAMIN) tablet Take 1 tablet by mouth daily.    . naproxen sodium (ANAPROX) 220 MG tablet Take 220 mg by mouth every 6 (six) hours as needed.    . pantoprazole (PROTONIX) 40 MG tablet Take 1 tablet (40 mg total) by mouth daily. (Patient not taking: Reported on 06/16/2015) 90 tablet 1  . potassium chloride SA (K-DUR,KLOR-CON) 20 MEQ tablet Take 20 mEq by mouth daily.    . traMADol (ULTRAM) 50 MG tablet Take 1 tablet (50 mg total) by mouth every 6 (six) hours as needed for severe pain. 15 tablet 0  . Vitamin D, Ergocalciferol, (DRISDOL) 50000 UNITS CAPS capsule Take 1 capsule (50,000 Units total) by mouth every 7 (seven) days. 4 capsule 2   No current facility-administered medications  on file prior to visit.    Past Surgical History  Procedure Laterality Date  . Cesarean section    . Cardiac catheterization    . Esophageal manometry N/A 09/29/2013    Procedure: ESOPHAGEAL MANOMETRY (EM);  Surgeon: Milus Banister, MD;  Location: [facility];  Service: Endoscopy;  Laterality: N/A;    Allergies  Allergen Reactions  . Losartan Shortness Of Breath  . Augmentin [Amoxicillin-Pot Clavulanate] Diarrhea  . Oxycodone-Acetaminophen Nausea And Vomiting    Social History   Social History  . Marital Status: Married    Spouse Name: N/A  . Number of Children: N/A  . Years of Education: N/A    Occupational History  . diabled    Social History Main Topics  . Smoking status: Never Smoker   . Smokeless tobacco: Never Used  . Alcohol Use: No  . Drug Use: No  . Sexual Activity: Not on file   Other Topics Concern  . Not on file   Social History Narrative    Family History  Problem Relation Age of Onset  . Diabetes Mother   . Diabetes Father   . Heart disease Mother     BP 134/66 mmHg  Pulse 73  Ht 5\' 4"  (1.626 m)  Wt 233 lb (105.688 kg)  BMI 39.97 kg/m2  LMP 05/20/2012  Review of Systems: See HPI above.    Objective:  Physical Exam:  Gen: NAD  Left shoulder: No swelling, ecchymoses.  No gross deformity. TTP distal clavicle, anterior shoulder. FROM with painful arc. Positive Hawkins, Neers. Negative Speeds, Yergasons. Strength 4/5 with empty can and 5/5 resisted internal/external rotation.  Pain empty can and ER. Negative apprehension. NV intact distally.  Right shoulder: FROM without pain.    Assessment & Plan:  1. Left shoulder injury - consistent with rotator cuff strain.  Radiographs negative for fracture.  Not improving with diclofenac, home exercises.  Has not yet heard about physical therapy - going to send referral a third time.  Injection seems to have helped only mildly.  Unfortunately we are in a holding pattern until her physical therapy is approved.  Continue current restrictions.  F/u in 6 weeks.

## 2015-09-14 NOTE — Assessment & Plan Note (Signed)
consistent with rotator cuff strain.  Radiographs negative for fracture.  Not improving with diclofenac, home exercises.  Has not yet heard about physical therapy - going to send referral a third time.  Injection seems to have helped only mildly.  Unfortunately we are in a holding pattern until her physical therapy is approved.  Continue current restrictions.  F/u in 6 weeks.

## 2015-10-06 ENCOUNTER — Other Ambulatory Visit: Payer: Self-pay | Admitting: Family Medicine

## 2015-10-07 NOTE — Telephone Encounter (Signed)
Med denied, pt needs to continue OTC 2000iu vitamin d daily.

## 2015-10-22 ENCOUNTER — Encounter: Payer: Self-pay | Admitting: Internal Medicine

## 2015-10-22 ENCOUNTER — Ambulatory Visit (INDEPENDENT_AMBULATORY_CARE_PROVIDER_SITE_OTHER): Payer: Federal, State, Local not specified - PPO | Admitting: Internal Medicine

## 2015-10-22 VITALS — BP 138/88 | HR 66 | Ht 64.0 in | Wt 249.0 lb

## 2015-10-22 DIAGNOSIS — J4540 Moderate persistent asthma, uncomplicated: Secondary | ICD-10-CM

## 2015-10-22 DIAGNOSIS — Z23 Encounter for immunization: Secondary | ICD-10-CM | POA: Diagnosis not present

## 2015-10-22 NOTE — Patient Instructions (Addendum)
ICD-9-CM ICD-10-CM   1. Moderate persistent asthma, uncomplicated 123456 [id number]    #ASTHMA Asthma appears well controlled Continue your asthma maintenance inhaler 2 puiff twice daily; dulera Continue on Protonix daily  Use albuterol as needed Flu shot today 10/22/2015  .  follow up  - Tammy Parrett or me  in 6 months and As needed    - Please contact office for sooner follow up if symptoms do not improve or worsen or seek emergency care

## 2015-10-22 NOTE — Progress Notes (Signed)
Subjective:     Patient ID: Rhonda Morrow, female   DOB: 21-Sep-1961, 54 y.o.   MRN: [id number]  HPI    54 year old female never smoker with known asthma Patient is deaf and mute  Test PFt date 01/24/12 shows mixed obstruction - restriction but greater restriction with low dlco  - fev1 1/48L/58%, Ratio 82, TLC 56, DLCO 14/51%, No BD response  CT chest 02/09/2012 showed no evidence of scarring   03/15/2015 follow-up asthma Interpreter present for visit today.  Patient returns for a six-month follow-up for asthma. She remains on Dulera twice daily. Says overall she is doing well. She denies any flare of cough, wheezing, shortness of breath. She is trying to exercise but gets winded at times. Uses her rescue inhaler occasionally . She denies any fever, chest pain, orthopnea, PND, or increased leg swelling , or syncope. She denies any hospitalizations or emergency room visits. Takes Zyrtec for allergy, controlled well    OV 10/22/2015  Chief Complaint  Patient presents with  . Follow-up    Pt c/o SOB with exertion. Pt denies cough/wheeze/CP/tightness. Pt does use albuterol HFA and it does relieve symptoms.    Follow-up asthma. Presents with interpreter Britt Bolognese.  As best as I can tell through the interpretation her asthma stable. Shortness of breath is mild and stable. It appears on exertion. She is not actively wheezing or having cough or active asthma symptoms. Nevertheless she says that she uses a Dulera twice a day but also her albuterol twice a day. Therefore does little bit unclear but as best as I can tell she is able to differentiate between maintenance and rescue inhaler.off note she is no longer on lisinoprilhe did later on she told me that she never uses albuterol and she feels well  Given lack of clarity with the subjective symptoms we did an asthma control  Test - a [employer] branded scale - in this she reported thather asthma prevents her from going to work most of the  time but then she says that she is going to work THE time. She GERD is "too. She only feels short of breath once or twice a week and she GERD score 4. She also experiences wheezing or cough or shortness of breath or chest tightness once or twice a week and she GERD score 4. In addition she says that she uses albuterol for rescue per 3 times per week and she gave at the score 3. She also feels that she somewhat controlled with her asthma and she GERD score 3. Total score is 16 and suggest asthma might not be under control.Overall do not know what to believe   We also did exhaled nitric oxide- could not perform the test   Current outpatient prescriptions:  .  albuterol (PROVENTIL HFA;VENTOLIN HFA) 108 (90 BASE) MCG/ACT inhaler, Inhale 2 puffs into the lungs every 6 (six) hours as needed for wheezing., Disp: 1 Inhaler, Rfl: 1 .  amLODipine (NORVASC) 5 MG tablet, TAKE 1 TABLET (5 MG TOTAL) BY MOUTH DAILY., Disp: 30 tablet, Rfl: 6 .  cetirizine (ZYRTEC) 10 MG tablet, TAKE 1 TABLET BY MOUTH EVERY DAY, Disp: 30 tablet, Rfl: 4 .  diclofenac (VOLTAREN) 75 MG EC tablet, Take 1 tablet (75 mg total) by mouth 2 (two) times daily., Disp: 60 tablet, Rfl: 1 .  famotidine (PEPCID) 20 MG tablet, Take 20 mg by mouth at bedtime., Disp: , Rfl:  .  Fiber CHEW, Chew 1 tablet by mouth daily., Disp: , Rfl:  .  fluticasone (FLONASE) 50 MCG/ACT nasal spray, PLACE 2 SPRAYS INTO BOTH NOSTRILS DAILY., Disp: 16 g, Rfl: 4 .  hydrochlorothiazide (HYDRODIURIL) 12.5 MG tablet, Take 1 tablet (12.5 mg total) by mouth daily., Disp: 90 tablet, Rfl: 1 .  meclizine (ANTIVERT) 25 MG tablet, Take 1 tablet (25 mg total) by mouth 3 (three) times daily as needed., Disp: 45 tablet, Rfl: 3 .  metFORMIN (GLUCOPHAGE-XR) 500 MG 24 hr tablet, TAKE 1 TABLET (500 MG TOTAL) BY MOUTH DAILY WITH BREAKFAST., Disp: 90 tablet, Rfl: 1 .  methocarbamol (ROBAXIN) 500 MG tablet, Take 1 tablet (500 mg total) by mouth 2 (two) times daily., Disp: 20 tablet, Rfl:  0 .  mometasone-formoterol (DULERA) 100-5 MCG/ACT AERO, Inhale 2 puffs into the lungs 2 (two) times daily., Disp: 8.8 g, Rfl: 3 .  Multiple Vitamin (MULTIVITAMIN) tablet, Take 1 tablet by mouth daily., Disp: , Rfl:  .  naproxen sodium (ANAPROX) 220 MG tablet, Take 220 mg by mouth every 6 (six) hours as needed., Disp: , Rfl:  .  potassium chloride SA (K-DUR,KLOR-CON) 20 MEQ tablet, Take 20 mEq by mouth daily., Disp: , Rfl:  .  traMADol (ULTRAM) 50 MG tablet, Take 1 tablet (50 mg total) by mouth every 6 (six) hours as needed for severe pain., Disp: 15 tablet, Rfl: 0 .  Vitamin D, Ergocalciferol, (DRISDOL) 50000 UNITS CAPS capsule, Take 1 capsule (50,000 Units total) by mouth every 7 (seven) days., Disp: 4 capsule, Rfl: 2  Allergies  Allergen Reactions  . Losartan Shortness Of Breath  . Augmentin [Amoxicillin-Pot Clavulanate] Diarrhea  . Oxycodone-Acetaminophen Nausea And Vomiting   Immunization History  Administered Date(s) Administered  . DT 04/07/2011  . Influenza Split 08/07/2011, 08/05/2012, 08/22/2012, 08/31/2014  . Influenza,inj,Quad PF,36+ Mos 08/20/2013  . Pneumococcal Polysaccharide-23 10/02/2012     Review of Systems  Per [facility]    Objective:   Physical Exam  Constitutional: She is oriented to person, place, and time. She appears well-developed and well-nourished. No distress.  obese  HENT:  Head: Normocephalic and atraumatic.  Right Ear: External ear normal.  Left Ear: External ear normal.  Mouth/Throat: Oropharynx is clear and moist. No oropharyngeal exudate.  Eyes: Conjunctivae and EOM are normal. Pupils are equal, round, and reactive to light. Right eye exhibits no discharge. Left eye exhibits no discharge. No scleral icterus.  Neck: Normal range of motion. Neck supple. No JVD present. No tracheal deviation present. No thyromegaly present.  Cardiovascular: Normal rate, regular rhythm, normal heart sounds and intact distal pulses.  Exam reveals no gallop and no friction  rub.   No murmur heard. Pulmonary/Chest: Effort normal and breath sounds normal. No respiratory distress. She has no wheezes. She has no rales. She exhibits no tenderness.  Abdominal: Soft. Bowel sounds are normal. She exhibits no distension and no mass. There is no tenderness. There is no rebound and no guarding.  Musculoskeletal: Normal range of motion. She exhibits no edema or tenderness.  Lymphadenopathy:    She has no cervical adenopathy.  Neurological: She is alert and oriented to person, place, and time. She has normal reflexes. No cranial nerve deficit. She exhibits normal muscle tone. Coordination normal.  Skin: Skin is warm and dry. No rash noted. She is not diaphoretic. No erythema. No pallor.  Psychiatric: She has a normal mood and affect. Her behavior is normal. Judgment and thought content normal.  Vitals reviewed.   Filed Vitals:   10/22/15 1633  BP: 138/88  Pulse: 66  Height: 5\' 4"  (1.626 m)  Weight: 249 lb (112.946 kg)  SpO2: 98%        Assessment:       ICD-9-CM ICD-10-CM   1. Moderate persistent asthma, uncomplicated 123456 [id number]        Plan:     ASTHMA Asthma appears well controlled as best as Ican tell Continue your asthma maintenance inhaler 2 puiff twice daily; dulera Continue on Protonix daily  Use albuterol as needed Flu shot today 10/22/2015  .  follow up  - Tammy Parrett or me  in 6 months and As needed    - Please contact office for sooner follow up if symptoms do not improve or worsen or seek emergency care     Dr. Brand Males, M.D., [facility].C.P Pulmonary and Critical Care Medicine Staff Physician [place] [facility] Pager: [phone], If no answer or between  15:00h - 7:00h: call [phone]  10/22/2015 4:54 PM

## 2015-10-25 ENCOUNTER — Encounter: Payer: Self-pay | Admitting: Family Medicine

## 2015-10-25 ENCOUNTER — Ambulatory Visit (INDEPENDENT_AMBULATORY_CARE_PROVIDER_SITE_OTHER): Admitting: Family Medicine

## 2015-10-25 VITALS — BP 114/78 | HR 79 | Ht 64.0 in | Wt 241.0 lb

## 2015-10-25 DIAGNOSIS — M67912 Unspecified disorder of synovium and tendon, left shoulder: Secondary | ICD-10-CM | POA: Diagnosis not present

## 2015-10-25 MED ORDER — NITROGLYCERIN 0.2 MG/HR TD PT24: MEDICATED_PATCH | TRANSDERMAL | Status: DC

## 2015-10-25 MED ORDER — DICLOFENAC SODIUM 75 MG PO TBEC: 75.0000 mg | DELAYED_RELEASE_TABLET | Freq: Two times a day (BID) | ORAL | Status: DC

## 2015-10-25 NOTE — Patient Instructions (Signed)
You have strained your rotator cuff Try to avoid painful activities (overhead activities, lifting with extended arm) as much as possible. Continue diclofenac twice a day as needed now with food for pain and inflammation. Can take tylenol in addition to this. We will continue to try to get physical therapy approved. Let's try nitro patches - 1/4th of a patch over the affected shoulder, change daily. Call me if you have to stop the patches and we will go ahead with an MRI. Otherwise follow up with me in 6 weeks for reevaluation. Do home exercise program with theraband and scapular stabilization exercises daily - these are very important for long term relief even if an injection was given.

## 2015-10-26 NOTE — Progress Notes (Signed)
PCP: Annye Asa, MD Referred by Dr. Larose Kells  Subjective:   HPI: Patient is a 54 y.o. female here for left shoulder injury.  8/18: Interpreter used throughout visit. Patient reports injury occurred on 7/29 at work. She drives a forklift at work. Felt a pop in left shoulder while doing so - mentioned some of equipment was broken and she did not realize this. Seemed to be mild but later that day when doing sign language she felt a lot of pain in the left shoulder. Pain currently 7/10. Tried aleve, advil, icing. Has not had radiographs. + night pain. Is right handed. Remotely  Had problems with this joint - had an injection and completely improved.  9/22: Interpreter present for visit. Patient reports pain still 6/10 at work, 5/10 at rest. Has not heard yet about physical therapy. No other changes.  11/7: Interpreter present. Unfortunately patient still has not heard about physical therapy. Pain level 5/10. No skin changes, fever, other complaints. Using pain pills, occasional ice. Doing home exercises.  12/19: Patient reports only slight improvement from last visit - 4/10 level pain, dull lateral left shoulder. Is taking voltaren but needs a refill - this helps. Avoiding overhead activities when possible because they bother her. Doing home exercises. Has not heard from workers comp regarding physical therapy. No skin changes, other complaints.  Past Medical History  Diagnosis Date  . Hypertension   . Deaf   . Asthma   . Thyroid disease   . Diabetes mellitus without complication [facility])     Current Outpatient Prescriptions on File Prior to Visit  Medication Sig Dispense Refill  . albuterol (PROVENTIL HFA;VENTOLIN HFA) 108 (90 BASE) MCG/ACT inhaler Inhale 2 puffs into the lungs every 6 (six) hours as needed for wheezing. 1 Inhaler 1  . amLODipine (NORVASC) 5 MG tablet TAKE 1 TABLET (5 MG TOTAL) BY MOUTH DAILY. 30 tablet 6  . cetirizine (ZYRTEC) 10 MG tablet TAKE 1  TABLET BY MOUTH EVERY DAY 30 tablet 4  . famotidine (PEPCID) 20 MG tablet Take 20 mg by mouth at bedtime.    . Fiber CHEW Chew 1 tablet by mouth daily.    . fluticasone (FLONASE) 50 MCG/ACT nasal spray PLACE 2 SPRAYS INTO BOTH NOSTRILS DAILY. 16 g 4  . hydrochlorothiazide (HYDRODIURIL) 12.5 MG tablet Take 1 tablet (12.5 mg total) by mouth daily. 90 tablet 1  . meclizine (ANTIVERT) 25 MG tablet Take 1 tablet (25 mg total) by mouth 3 (three) times daily as needed. 45 tablet 3  . metFORMIN (GLUCOPHAGE-XR) 500 MG 24 hr tablet TAKE 1 TABLET (500 MG TOTAL) BY MOUTH DAILY WITH BREAKFAST. 90 tablet 1  . methocarbamol (ROBAXIN) 500 MG tablet Take 1 tablet (500 mg total) by mouth 2 (two) times daily. 20 tablet 0  . mometasone-formoterol (DULERA) 100-5 MCG/ACT AERO Inhale 2 puffs into the lungs 2 (two) times daily. 8.8 g 3  . Multiple Vitamin (MULTIVITAMIN) tablet Take 1 tablet by mouth daily.    . potassium chloride SA (K-DUR,KLOR-CON) 20 MEQ tablet Take 20 mEq by mouth daily.    . Vitamin D, Ergocalciferol, (DRISDOL) 50000 UNITS CAPS capsule Take 1 capsule (50,000 Units total) by mouth every 7 (seven) days. 4 capsule 2   No current facility-administered medications on file prior to visit.    Past Surgical History  Procedure Laterality Date  . Cesarean section    . Cardiac catheterization    . Esophageal manometry N/A 09/29/2013    Procedure: ESOPHAGEAL MANOMETRY (EM);  Surgeon:  Milus Banister, MD;  Location: [facility];  Service: Endoscopy;  Laterality: N/A;    Allergies  Allergen Reactions  . Losartan Shortness Of Breath  . Augmentin [Amoxicillin-Pot Clavulanate] Diarrhea  . Oxycodone-Acetaminophen Nausea And Vomiting    Social History   Social History  . Marital Status: Married    Spouse Name: N/A  . Number of Children: N/A  . Years of Education: N/A   Occupational History  . diabled    Social History Main Topics  . Smoking status: Never Smoker   . Smokeless tobacco: Never  Used  . Alcohol Use: No  . Drug Use: No  . Sexual Activity: Not on file   Other Topics Concern  . Not on file   Social History Narrative    Family History  Problem Relation Age of Onset  . Diabetes Mother   . Diabetes Father   . Heart disease Mother     BP 114/78 mmHg  Pulse 79  Ht 5\' 4"  (1.626 m)  Wt 241 lb (109.317 kg)  BMI 41.35 kg/m2  LMP 05/20/2012  Review of Systems: See HPI above.    Objective:  Physical Exam:  Gen: NAD  Left shoulder: No swelling, ecchymoses.  No gross deformity. Minimal TTP distal clavicle, anterior shoulder. FROM with painful arc. Mild positive Hawkins, Neers. Negative Speeds, Yergasons. Strength 5/5 with empty can and 5/5 resisted internal/external rotation.  Pain empty can only now. Negative apprehension. NV intact distally.  Right shoulder: FROM without pain.    Assessment & Plan:  1. Left shoulder injury - consistent with rotator cuff strain.  Radiographs negative for fracture.  Now 5 months out and still only minimal improvement.  Referrals to workers comp for physical therapy placed three times but they have not responded or approved the requests.  Will continue to try to get this approved.  We discussed and will try nitro patches as well - discussed risks of headache, skin irritation.  Continue current restrictions.  F/u in 6 weeks.  Consider MRI in future.

## 2015-10-26 NOTE — Assessment & Plan Note (Signed)
consistent with rotator cuff strain.  Radiographs negative for fracture.  Now 5 months out and still only minimal improvement.  Referrals to workers comp for physical therapy placed three times but they have not responded or approved the requests.  Will continue to try to get this approved.  We discussed and will try nitro patches as well - discussed risks of headache, skin irritation.  Continue current restrictions.  F/u in 6 weeks.  Consider MRI in future.

## 2015-10-27 ENCOUNTER — Encounter: Payer: Self-pay | Admitting: General Practice

## 2015-10-27 ENCOUNTER — Telehealth: Payer: Self-pay | Admitting: General Practice

## 2015-10-27 ENCOUNTER — Other Ambulatory Visit: Payer: Self-pay | Admitting: General Practice

## 2015-10-27 ENCOUNTER — Encounter: Payer: Self-pay | Admitting: Family Medicine

## 2015-10-27 ENCOUNTER — Ambulatory Visit (INDEPENDENT_AMBULATORY_CARE_PROVIDER_SITE_OTHER): Payer: Federal, State, Local not specified - PPO | Admitting: Family Medicine

## 2015-10-27 VITALS — BP 130/78 | HR 84 | Temp 98.0°F | Resp 16 | Ht 64.0 in | Wt 242.4 lb

## 2015-10-27 DIAGNOSIS — E1140 Type 2 diabetes mellitus with diabetic neuropathy, unspecified: Secondary | ICD-10-CM | POA: Diagnosis not present

## 2015-10-27 DIAGNOSIS — N898 Other specified noninflammatory disorders of vagina: Secondary | ICD-10-CM | POA: Diagnosis not present

## 2015-10-27 DIAGNOSIS — E876 Hypokalemia: Secondary | ICD-10-CM

## 2015-10-27 LAB — MICROALBUMIN / CREATININE URINE RATIO
CREATININE, U: 175.8 mg/dL
MICROALB/CREAT RATIO: 0.5 mg/g (ref 0.0–30.0)
Microalb, Ur: 0.8 mg/dL (ref 0.0–1.9)

## 2015-10-27 LAB — BASIC METABOLIC PANEL
BUN: 14 mg/dL (ref 6–23)
CALCIUM: 9.6 mg/dL (ref 8.4–10.5)
CO2: 31 meq/L (ref 19–32)
Chloride: 102 mEq/L (ref 96–112)
Creatinine, Ser: 0.85 mg/dL (ref 0.40–1.20)
GFR: 90.28 mL/min (ref >60.00)
GLUCOSE: 84 mg/dL (ref 70–99)
POTASSIUM: 3.1 meq/L — AB (ref 3.5–5.1)
SODIUM: 140 meq/L (ref 135–145)

## 2015-10-27 LAB — HEMOGLOBIN A1C: HEMOGLOBIN A1C: 6.4 % (ref 4.6–6.5)

## 2015-10-27 MED ORDER — POTASSIUM CHLORIDE CRYS ER 20 MEQ PO TBCR: 20.0000 meq | EXTENDED_RELEASE_TABLET | Freq: Every day | ORAL | Status: DC

## 2015-10-27 MED ORDER — ALBUTEROL SULFATE HFA 108 (90 BASE) MCG/ACT IN AERS: 2.0000 | INHALATION_SPRAY | Freq: Four times a day (QID) | RESPIRATORY_TRACT | Status: DC | PRN

## 2015-10-27 NOTE — Patient Instructions (Signed)
Schedule an appt w/ Dr Etter Sjogren in 3-4 months to recheck diabetes and cholesterol We'll notify you of your lab results and make any changes if needed Continue to make healthy food choices and try and get regular exercise We'll call you with your GYN appt Please schedule your eye exam- this is very important! Start daily Vit D supplement- 2,000 or 5,000 units daily (available over the counter) Call with any questions or concerns Happy Holidays!!!

## 2015-10-27 NOTE — Telephone Encounter (Signed)
Spoke with Claiborne Billings and she gave me the number for [facility] interpreter services. [phone]. Called and LMOVM to inform of pt appt date and time.

## 2015-10-27 NOTE — Assessment & Plan Note (Signed)
Chronic problem.  Tolerating Metformin w/o difficulty.  Unable to take ACE/ARB due to previous intolerance- will get microalbumin today.  Due for eye exam- pt plans to schedule.  Again discussed need for healthy diet and regular exercise.  Check labs.  Adjust meds prn

## 2015-10-27 NOTE — Telephone Encounter (Signed)
Called and left a message for Rhonda Morrow to return call to find out what is needed to have an interpreter accompany pt at her appt tomorrow at 2:20pm at [facility].

## 2015-10-27 NOTE — Progress Notes (Signed)
Pre visit review using our clinic review tool, if applicable. No additional management support is needed unless otherwise documented below in the visit note. 

## 2015-10-27 NOTE — Progress Notes (Signed)
   Subjective:    Patient ID: Rhonda Morrow, female    DOB: 1961-06-22, 54 y.o.   MRN: [id number]  HPI DM- chronic problem, on Metformin 500mg  XR.  Pt was intolerant to ACE/ARB.  Due for microalbumin.  Due for eye exam.  UTD on foot exam.  Pt has lost 7 lbs recently.  Not checking sugars at home.  Denies CP, SOB, HAs, visual changes, edema.  Denies symptomatic lows.  Asthma- pt is due for albuterol refill  Pt is due for GYN exam- would like new referral b/c current office doesn't accept interpreters   Review of Systems For ROS see HPI     Objective:   Physical Exam  Constitutional: She is oriented to person, place, and time. She appears well-developed and well-nourished. No distress.  HENT:  Head: Normocephalic and atraumatic.  Eyes: Conjunctivae and EOM are normal. Pupils are equal, round, and reactive to light.  Neck: Normal range of motion. Neck supple. No thyromegaly present.  Cardiovascular: Normal rate, regular rhythm, normal heart sounds and intact distal pulses.   No murmur heard. Pulmonary/Chest: Effort normal and breath sounds normal. No respiratory distress.  Abdominal: Soft. She exhibits no distension. There is no tenderness.  Musculoskeletal: She exhibits no edema.  Lymphadenopathy:    She has no cervical adenopathy.  Neurological: She is alert and oriented to person, place, and time.  Skin: Skin is warm and dry.  Psychiatric: She has a normal mood and affect. Her behavior is normal.  Vitals reviewed.         Assessment & Plan:

## 2015-10-28 LAB — HM PAP SMEAR

## 2015-11-03 ENCOUNTER — Telehealth: Payer: Self-pay | Admitting: Family Medicine

## 2015-11-03 NOTE — Addendum Note (Signed)
Addended by: Sherrie George F on: 11/03/2015 01:50 PM   Modules accepted: Orders

## 2015-11-03 NOTE — Telephone Encounter (Signed)
It's ok to go ahead with MRI to assess for rotator cuff tear in Rhonda Morrow.

## 2015-11-04 NOTE — Telephone Encounter (Signed)
MRI ordered and referral sent to workers comp.

## 2015-11-15 ENCOUNTER — Telehealth: Payer: Self-pay | Admitting: Family Medicine

## 2015-11-15 NOTE — Telephone Encounter (Signed)
Caller name: Myricle   Relationship to patient: Self   Can be reached:[phone]  Reason for call: At check out pt asked about getting a referral to an eye dr.

## 2015-11-16 ENCOUNTER — Other Ambulatory Visit: Payer: Self-pay | Admitting: Family Medicine

## 2015-11-16 ENCOUNTER — Encounter: Payer: Self-pay | Admitting: Medical

## 2015-11-16 ENCOUNTER — Ambulatory Visit (INDEPENDENT_AMBULATORY_CARE_PROVIDER_SITE_OTHER): Payer: Federal, State, Local not specified - PPO | Admitting: Medical

## 2015-11-16 ENCOUNTER — Ambulatory Visit: Payer: Self-pay | Admitting: Medical

## 2015-11-16 VITALS — BP 116/78 | HR 76 | Temp 97.6°F | Ht 64.0 in | Wt 240.6 lb

## 2015-11-16 DIAGNOSIS — R252 Cramp and spasm: Secondary | ICD-10-CM

## 2015-11-16 DIAGNOSIS — Z0100 Encounter for examination of eyes and vision without abnormal findings: Principal | ICD-10-CM

## 2015-11-16 DIAGNOSIS — S76311A Strain of muscle, fascia and tendon of the posterior muscle group at thigh level, right thigh, initial encounter: Secondary | ICD-10-CM | POA: Diagnosis not present

## 2015-11-16 DIAGNOSIS — E119 Type 2 diabetes mellitus without complications: Secondary | ICD-10-CM

## 2015-11-16 LAB — COMPREHENSIVE METABOLIC PANEL
ALBUMIN: 3.9 g/dL (ref 3.5–5.2)
ALT: 22 U/L (ref 0–35)
AST: 22 U/L (ref 0–37)
Alkaline Phosphatase: 66 U/L (ref 39–117)
BUN: 14 mg/dL (ref 6–23)
CHLORIDE: 104 meq/L (ref 96–112)
CO2: 31 mEq/L (ref 19–32)
CREATININE: 0.75 mg/dL (ref 0.40–1.20)
Calcium: 9.6 mg/dL (ref 8.4–10.5)
GFR: 104.28 mL/min (ref >60.00)
Glucose, Bld: 99 mg/dL (ref 70–99)
Potassium: 3.6 mEq/L (ref 3.5–5.1)
SODIUM: 142 meq/L (ref 135–145)
TOTAL PROTEIN: 7.2 g/dL (ref 6.0–8.3)
Total Bilirubin: 0.5 mg/dL (ref 0.2–1.2)

## 2015-11-16 LAB — MAGNESIUM: MAGNESIUM: 2.1 mg/dL (ref 1.5–2.5)

## 2015-11-16 MED ORDER — CYCLOBENZAPRINE HCL 5 MG PO TABS: 5.0000 mg | ORAL_TABLET | Freq: Every day | ORAL | Status: DC

## 2015-11-16 NOTE — Telephone Encounter (Signed)
[place] for referral for eye exam (ophthalmology- dx DM)

## 2015-11-16 NOTE — Telephone Encounter (Signed)
Please place referral

## 2015-11-16 NOTE — Telephone Encounter (Signed)
Order placed

## 2015-11-16 NOTE — Patient Instructions (Addendum)
For hamstring strain will rx diclofenac(actually informed her that she should have prescription already at [facility] per our computer) and rx muscle relaxant.(use muscle relaxant only at night).  Will go ahead and refer you to sports medicine.  For muscle cramps will get cmp and mg level. You are on k tab but need to determine k level today  If you get pain behind the knee (popliteal fossae) as I explained on exam notify us so we can get stat doppler. This does not appear indicated today but would get study if location of pain changed.  Follow up in 7 days or as  needed

## 2015-11-16 NOTE — Progress Notes (Signed)
Subjective:    Patient ID: Rhonda Morrow, female    DOB: Aug 12, 1961, 55 y.o.   MRN: [id number]  HPI   Pt in states she had cramp in her leg on Friday of last week. She states This may have been related to potassium chloride. She stopped medication and cramps seemed to go away. Pt states was getting cramp Friday, Saturday and Sunday.   Pain when she walks has pain behind back of leg/hamstring. Cramps at night in this area. And when walks feels cramps.   She thought when she stopped k tab that she felt better.    Review of Systems  Constitutional: Negative for fever, chills and fatigue.  HENT: Negative for congestion, mouth sores, postnasal drip, sinus pressure, sneezing and sore throat.   Respiratory: Negative for cough, chest tightness, shortness of breath and wheezing.   Cardiovascular: Negative for chest pain and palpitations.  Gastrointestinal: Negative for abdominal pain.  Musculoskeletal: Negative for back pain.       Rt hamstring pain and cramps see hpi.  Skin: Negative for rash.  Neurological: Negative for dizziness and headaches.  Hematological: Negative for adenopathy. Does not bruise/bleed easily.  Psychiatric/Behavioral: Negative for behavioral problems and confusion.   Past Medical History  Diagnosis Date  . Hypertension   . Deaf   . Asthma   . Thyroid disease   . Diabetes mellitus without complication [facility])     Social History   Social History  . Marital Status: Married    Spouse Name: N/A  . Number of Children: N/A  . Years of Education: N/A   Occupational History  . diabled    Social History Main Topics  . Smoking status: Never Smoker   . Smokeless tobacco: Never Used  . Alcohol Use: No  . Drug Use: No  . Sexual Activity: Not on file   Other Topics Concern  . Not on file   Social History Narrative    Past Surgical History  Procedure Laterality Date  . Cesarean section    . Cardiac catheterization    . Esophageal manometry N/A 09/29/2013      Procedure: ESOPHAGEAL MANOMETRY (EM);  Surgeon: Milus Banister, MD;  Location: [facility];  Service: Endoscopy;  Laterality: N/A;    Family History  Problem Relation Age of Onset  . Diabetes Mother   . Diabetes Father   . Heart disease Mother     Allergies  Allergen Reactions  . Losartan Shortness Of Breath  . Augmentin [Amoxicillin-Pot Clavulanate] Diarrhea  . Oxycodone-Acetaminophen Nausea And Vomiting    Current Outpatient Prescriptions on File Prior to Visit  Medication Sig Dispense Refill  . albuterol (PROVENTIL HFA;VENTOLIN HFA) 108 (90 BASE) MCG/ACT inhaler Inhale 2 puffs into the lungs every 6 (six) hours as needed for wheezing. 1 Inhaler 3  . amLODipine (NORVASC) 5 MG tablet TAKE 1 TABLET (5 MG TOTAL) BY MOUTH DAILY. 30 tablet 6  . cetirizine (ZYRTEC) 10 MG tablet TAKE 1 TABLET BY MOUTH EVERY DAY 30 tablet 4  . diclofenac (VOLTAREN) 75 MG EC tablet Take 1 tablet (75 mg total) by mouth 2 (two) times daily. 60 tablet 1  . famotidine (PEPCID) 20 MG tablet Take 20 mg by mouth at bedtime.    . Fiber CHEW Chew 1 tablet by mouth daily.    . fluticasone (FLONASE) 50 MCG/ACT nasal spray PLACE 2 SPRAYS INTO BOTH NOSTRILS DAILY. 16 g 4  . hydrochlorothiazide (HYDRODIURIL) 12.5 MG tablet Take 1 tablet (12.5 mg total) by  mouth daily. 90 tablet 1  . meclizine (ANTIVERT) 25 MG tablet Take 1 tablet (25 mg total) by mouth 3 (three) times daily as needed. 45 tablet 3  . metFORMIN (GLUCOPHAGE-XR) 500 MG 24 hr tablet TAKE 1 TABLET (500 MG TOTAL) BY MOUTH DAILY WITH BREAKFAST. 90 tablet 1  . methocarbamol (ROBAXIN) 500 MG tablet Take 1 tablet (500 mg total) by mouth 2 (two) times daily. 20 tablet 0  . mometasone-formoterol (DULERA) 100-5 MCG/ACT AERO Inhale 2 puffs into the lungs 2 (two) times daily. 8.8 g 3  . Multiple Vitamin (MULTIVITAMIN) tablet Take 1 tablet by mouth daily.    . nitroGLYCERIN (NITRODUR - DOSED IN MG/24 HR) 0.2 mg/hr patch Apply 1/4th patch to affected shoulder, change  daily 30 patch 1  . potassium chloride SA (K-DUR,KLOR-CON) 20 MEQ tablet Take 1 tablet (20 mEq total) by mouth daily. 30 tablet 6   No current facility-administered medications on file prior to visit.    BP 116/78 mmHg  Pulse 76  Temp(Src) 97.6 F (36.4 C) (Oral)  Ht 5\' 4"  (1.626 m)  Wt 240 lb 9.6 oz (109.135 kg)  BMI 41.28 kg/m2  SpO2 99%  LMP 05/20/2012       Objective:   Physical Exam  General Mental Status- Alert. General Appearance- Not in acute distress.   Skin General: Color- Normal Color. Moisture- Normal Moisture.  Neck Carotid Arteries- Normal color. Moisture- Normal Moisture. No carotid bruits. No JVD.  Chest and Lung Exam Auscultation: Breath Sounds:-Normal. CTA.  Cardiovascular Auscultation:Rythm- Regular,Rate and rhythm. Murmurs & Other Heart Sounds:Auscultation of the heart reveals- No Murmurs.  Abdomen Inspection:-Inspeection Normal. Palpation/Percussion:Note:No mass. Palpation and Percussion of the abdomen reveal- Non Tender, Non Distended + BS, no rebound or guarding.    Neurologic Cranial Nerve exam:- CN III-XII intact(No nystagmus), symmetric smile. Strength:- 5/5 equal and symmetric strength both upper and lower extremities.  Rt lower ext- no calf edema. Symmetric to left calf. Negative homans signs. Rt hamstring mid aspect tender to palpation. When leg is extended pain in hamstring easily reproduced. Some tenderness to palpation lateral hamstring tendon. No redness, warmth or induration of the skin.      Assessment & Plan:  For hamstring strain will rx diclofena(actually informed her that she should have prescription already at [facility] per our computer) and rx muscle relaxant.(use muscle relaxant only at night).  Will go ahead and refer you to sports medicine.  For muscle cramps will get cmp and mg level. You are on k tab but need to determine k level today  If you get pain behind the knee (popliteal fossae) as I explained on exam notify  us so we can get stat doppler. This does not appear indicated today but would get study if location of pain changed.  Follow up in 7 days or as  needed

## 2015-11-16 NOTE — Progress Notes (Signed)
Pre visit review using our clinic review tool, if applicable. No additional management support is needed unless otherwise documented below in the visit note. 

## 2015-11-17 ENCOUNTER — Ambulatory Visit (INDEPENDENT_AMBULATORY_CARE_PROVIDER_SITE_OTHER): Payer: Federal, State, Local not specified - PPO | Admitting: Family Medicine

## 2015-11-17 ENCOUNTER — Encounter: Payer: Self-pay | Admitting: Family Medicine

## 2015-11-17 VITALS — BP 121/73 | HR 76 | Ht 67.0 in | Wt 240.0 lb

## 2015-11-17 DIAGNOSIS — S76311A Strain of muscle, fascia and tendon of the posterior muscle group at thigh level, right thigh, initial encounter: Secondary | ICD-10-CM | POA: Diagnosis not present

## 2015-11-17 NOTE — Patient Instructions (Addendum)
You have a hamstring strain. Do the home exercises I showed you (curls, swings, and lunges) - work your way up to doing 3 sets of 10 once a day. Heat 15 minutes at a time 3-4 times a day (you can use ice instead if this feels better). Continue the potassium chloride pills. I would take the naproxen and flexeril (muscle relaxant) only if you need them. Try to stay hydrated. I would consider a compression sleeve and/or physical therapy if you're not improving. Follow up with me in 5-6 weeks for this issue.

## 2015-11-18 ENCOUNTER — Ambulatory Visit: Payer: Self-pay | Admitting: Family Medicine

## 2015-11-18 DIAGNOSIS — S76311A Strain of muscle, fascia and tendon of the posterior muscle group at thigh level, right thigh, initial encounter: Secondary | ICD-10-CM | POA: Insufficient documentation

## 2015-11-18 NOTE — Progress Notes (Signed)
PCP: Annye Asa, MD Consultation requested by: Mackie Pai PA-C  Subjective:   HPI: Patient is a 55 y.o. female here for right hamstring pain.  Patient seen with interpreter Patient denies known injury. She states she started to get cramps every night past couple weeks. Has known low potassium and replaced with pills - thought the pills may have caused this. Labs checked yesterday showed potassium low normal at 3.6. She reports right posterior hamstring pain, spasms. Worse at nighttime, with walking. Pain level 5/10, describes as crampy. No skin changes, fever, bruising, other complaints.  Past Medical History  Diagnosis Date  . Hypertension   . Deaf   . Asthma   . Thyroid disease   . Diabetes mellitus without complication [facility])     Current Outpatient Prescriptions on File Prior to Visit  Medication Sig Dispense Refill  . albuterol (PROVENTIL HFA;VENTOLIN HFA) 108 (90 BASE) MCG/ACT inhaler Inhale 2 puffs into the lungs every 6 (six) hours as needed for wheezing. 1 Inhaler 3  . amLODipine (NORVASC) 5 MG tablet TAKE 1 TABLET (5 MG TOTAL) BY MOUTH DAILY. 30 tablet 6  . cetirizine (ZYRTEC) 10 MG tablet TAKE 1 TABLET BY MOUTH EVERY DAY 30 tablet 4  . cyclobenzaprine (FLEXERIL) 5 MG tablet Take 1 tablet (5 mg total) by mouth at bedtime. 7 tablet 0  . diclofenac (VOLTAREN) 75 MG EC tablet Take 1 tablet (75 mg total) by mouth 2 (two) times daily. 60 tablet 1  . famotidine (PEPCID) 20 MG tablet Take 20 mg by mouth at bedtime.    . Fiber CHEW Chew 1 tablet by mouth daily.    . fluticasone (FLONASE) 50 MCG/ACT nasal spray PLACE 2 SPRAYS INTO BOTH NOSTRILS DAILY. 16 g 4  . hydrochlorothiazide (HYDRODIURIL) 12.5 MG tablet Take 1 tablet (12.5 mg total) by mouth daily. 90 tablet 1  . meclizine (ANTIVERT) 25 MG tablet Take 1 tablet (25 mg total) by mouth 3 (three) times daily as needed. 45 tablet 3  . metFORMIN (GLUCOPHAGE-XR) 500 MG 24 hr tablet TAKE 1 TABLET (500 MG TOTAL) BY MOUTH  DAILY WITH BREAKFAST. 90 tablet 1  . methocarbamol (ROBAXIN) 500 MG tablet Take 1 tablet (500 mg total) by mouth 2 (two) times daily. 20 tablet 0  . mometasone-formoterol (DULERA) 100-5 MCG/ACT AERO Inhale 2 puffs into the lungs 2 (two) times daily. 8.8 g 3  . Multiple Vitamin (MULTIVITAMIN) tablet Take 1 tablet by mouth daily.    . nitroGLYCERIN (NITRODUR - DOSED IN MG/24 HR) 0.2 mg/hr patch Apply 1/4th patch to affected shoulder, change daily 30 patch 1  . potassium chloride SA (K-DUR,KLOR-CON) 20 MEQ tablet Take 1 tablet (20 mEq total) by mouth daily. 30 tablet 6   No current facility-administered medications on file prior to visit.    Past Surgical History  Procedure Laterality Date  . Cesarean section    . Cardiac catheterization    . Esophageal manometry N/A 09/29/2013    Procedure: ESOPHAGEAL MANOMETRY (EM);  Surgeon: Milus Banister, MD;  Location: [facility];  Service: Endoscopy;  Laterality: N/A;    Allergies  Allergen Reactions  . Losartan Shortness Of Breath  . Augmentin [Amoxicillin-Pot Clavulanate] Diarrhea  . Oxycodone-Acetaminophen Nausea And Vomiting    Social History   Social History  . Marital Status: Married    Spouse Name: N/A  . Number of Children: N/A  . Years of Education: N/A   Occupational History  . diabled    Social History Main Topics  .  Smoking status: Never Smoker   . Smokeless tobacco: Never Used  . Alcohol Use: No  . Drug Use: No  . Sexual Activity: Not on file   Other Topics Concern  . Not on file   Social History Narrative    Family History  Problem Relation Age of Onset  . Diabetes Mother   . Diabetes Father   . Heart disease Mother     BP 121/73 mmHg  Pulse 76  Ht 5\' 7"  (1.702 m)  Wt 240 lb (108.863 kg)  BMI 37.58 kg/m2  LMP 05/20/2012  Review of Systems: See HPI above.    Objective:  Physical Exam:  Gen: NAD  Right leg: No gross deformity, bruising, deformity.  No palpable defect in hamstring. TTP  midportion of medial hamstring.  No other tenderness. FROM knee and hip.  Pain and 5-/5 with knee flexion at 30 and 90 degrees. NVI distally.  Left leg: FROM without pain or weakness.    Assessment & Plan:  1. Right hamstring strain - with underlying potassium deficiency.  Shown home exercises to do daily.  Heat for spasms, stay hydrated.  Offered compression sleeve and PT but she declined for now.  F/u in 5-6 weeks.  Continue potassium supplementation.

## 2015-11-18 NOTE — Assessment & Plan Note (Signed)
with underlying potassium deficiency.  Shown home exercises to do daily.  Heat for spasms, stay hydrated.  Offered compression sleeve and PT but she declined for now.  F/u in 5-6 weeks.  Continue potassium supplementation.

## 2015-11-23 ENCOUNTER — Ambulatory Visit (INDEPENDENT_AMBULATORY_CARE_PROVIDER_SITE_OTHER): Payer: Federal, State, Local not specified - PPO | Admitting: Medical

## 2015-11-23 ENCOUNTER — Encounter: Payer: Self-pay | Admitting: Medical

## 2015-11-23 ENCOUNTER — Telehealth: Payer: Self-pay | Admitting: Family Medicine

## 2015-11-23 ENCOUNTER — Ambulatory Visit: Payer: Self-pay | Admitting: Medical

## 2015-11-23 VITALS — BP 110/72 | HR 87 | Temp 98.1°F | Ht 67.0 in | Wt 240.6 lb

## 2015-11-23 DIAGNOSIS — R05 Cough: Secondary | ICD-10-CM | POA: Diagnosis not present

## 2015-11-23 DIAGNOSIS — R252 Cramp and spasm: Secondary | ICD-10-CM | POA: Diagnosis not present

## 2015-11-23 DIAGNOSIS — R197 Diarrhea, unspecified: Secondary | ICD-10-CM

## 2015-11-23 DIAGNOSIS — R5383 Other fatigue: Secondary | ICD-10-CM

## 2015-11-23 DIAGNOSIS — R062 Wheezing: Secondary | ICD-10-CM

## 2015-11-23 DIAGNOSIS — R059 Cough, unspecified: Secondary | ICD-10-CM

## 2015-11-23 LAB — HM DEXA SCAN: HM Dexa Scan: NORMAL

## 2015-11-23 MED ORDER — PREDNISONE 20 MG PO TABS: ORAL_TABLET | ORAL | Status: DC

## 2015-11-23 NOTE — Progress Notes (Signed)
Subjective:    Patient ID: Rhonda Morrow, female    DOB: 1961/07/02, 55 y.o.   MRN: [id number]  HPI   Pt in for follow up.   Pt pain is still present in her rt hamstring but less. Pt has seen sports medicine and is following sports med advisement.   Since last visit pt has not is getting some cramping in various muscles in legs. She points to thigh and calves. She had stopped taking her k tablets since last visit. Cramping mostly at night.   She also has had diarrhea about 3 times a yesterday and twice today. A lot of coworkers have GI as well recently per pt.  Pt also has some asthma. Pt had to use abltuerol one time yesterday. Using dulera as well. Since Friday has had dry cough. Some phlem. But no  Fever chills or sweats.    Review of Systems  Constitutional: Positive for fatigue. Negative for fever, chills and diaphoresis.  HENT: Negative for congestion, ear pain, postnasal drip and sinus pressure.   Respiratory: Positive for cough and wheezing. Negative for apnea, choking, chest tightness and shortness of breath.        See hpi.  Gastrointestinal: Positive for diarrhea. Negative for nausea, abdominal pain, constipation, blood in stool, abdominal distention and anal bleeding.  Musculoskeletal:       Cramping and hamstring pain.  Neurological: Negative for dizziness, speech difficulty, weakness and headaches.  Hematological: Negative for adenopathy. Does not bruise/bleed easily.    Past Medical History  Diagnosis Date  . Hypertension   . Deaf   . Asthma   . Thyroid disease   . Diabetes mellitus without complication [facility])     Social History   Social History  . Marital Status: Married    Spouse Name: N/A  . Number of Children: N/A  . Years of Education: N/A   Occupational History  . diabled    Social History Main Topics  . Smoking status: Never Smoker   . Smokeless tobacco: Never Used  . Alcohol Use: No  . Drug Use: No  . Sexual Activity: Not on file   Other  Topics Concern  . Not on file   Social History Narrative    Past Surgical History  Procedure Laterality Date  . Cesarean section    . Cardiac catheterization    . Esophageal manometry N/A 09/29/2013    Procedure: ESOPHAGEAL MANOMETRY (EM);  Surgeon: Milus Banister, MD;  Location: [facility];  Service: Endoscopy;  Laterality: N/A;    Family History  Problem Relation Age of Onset  . Diabetes Mother   . Diabetes Father   . Heart disease Mother     Allergies  Allergen Reactions  . Losartan Shortness Of Breath  . Augmentin [Amoxicillin-Pot Clavulanate] Diarrhea  . Oxycodone-Acetaminophen Nausea And Vomiting    Current Outpatient Prescriptions on File Prior to Visit  Medication Sig Dispense Refill  . albuterol (PROVENTIL HFA;VENTOLIN HFA) 108 (90 BASE) MCG/ACT inhaler Inhale 2 puffs into the lungs every 6 (six) hours as needed for wheezing. 1 Inhaler 3  . amLODipine (NORVASC) 5 MG tablet TAKE 1 TABLET (5 MG TOTAL) BY MOUTH DAILY. 30 tablet 6  . cetirizine (ZYRTEC) 10 MG tablet TAKE 1 TABLET BY MOUTH EVERY DAY 30 tablet 4  . cyclobenzaprine (FLEXERIL) 5 MG tablet Take 1 tablet (5 mg total) by mouth at bedtime. 7 tablet 0  . diclofenac (VOLTAREN) 75 MG EC tablet Take 1 tablet (75 mg total) by  mouth 2 (two) times daily. 60 tablet 1  . famotidine (PEPCID) 20 MG tablet Take 20 mg by mouth at bedtime.    . Fiber CHEW Chew 1 tablet by mouth daily.    . fluticasone (FLONASE) 50 MCG/ACT nasal spray PLACE 2 SPRAYS INTO BOTH NOSTRILS DAILY. 16 g 4  . hydrochlorothiazide (HYDRODIURIL) 12.5 MG tablet Take 1 tablet (12.5 mg total) by mouth daily. 90 tablet 1  . meclizine (ANTIVERT) 25 MG tablet Take 1 tablet (25 mg total) by mouth 3 (three) times daily as needed. 45 tablet 3  . metFORMIN (GLUCOPHAGE-XR) 500 MG 24 hr tablet TAKE 1 TABLET (500 MG TOTAL) BY MOUTH DAILY WITH BREAKFAST. 90 tablet 1  . methocarbamol (ROBAXIN) 500 MG tablet Take 1 tablet (500 mg total) by mouth 2 (two) times daily.  20 tablet 0  . mometasone-formoterol (DULERA) 100-5 MCG/ACT AERO Inhale 2 puffs into the lungs 2 (two) times daily. 8.8 g 3  . Multiple Vitamin (MULTIVITAMIN) tablet Take 1 tablet by mouth daily.    . nitroGLYCERIN (NITRODUR - DOSED IN MG/24 HR) 0.2 mg/hr patch Apply 1/4th patch to affected shoulder, change daily 30 patch 1  . potassium chloride SA (K-DUR,KLOR-CON) 20 MEQ tablet Take 1 tablet (20 mEq total) by mouth daily. 30 tablet 6   No current facility-administered medications on file prior to visit.    BP 110/72 mmHg  Pulse 87  Temp(Src) 98.1 F (36.7 C) (Oral)  Ht 5\' 7"  (1.702 m)  Wt 240 lb 9.6 oz (109.135 kg)  BMI 37.67 kg/m2  SpO2 98%  LMP 05/20/2012       Objective:   Physical Exam  General  Mental Status - Alert. General Appearance - Well groomed. Not in acute distress.  Skin Rashes- No Rashes.   Neck Neck- Supple. No Masses.   Chest and Lung Exam Auscultation: Breath Sounds:- even and unlabored  Cardiovascular Auscultation:Rythm- Regular, rate and rhythm. Murmurs & Other Heart Sounds:Ausculatation of the heart reveal- No Murmurs.  Lymphatic Head & Neck General Head & Neck Lymphatics: Bilateral: Description- No Localized lymphadenopathy.   Abdomen Inspection:-Inspection Normal.  Palpation/Perucssion: Palpation and Percussion of the abdomen reveal- Non Tender, No Rebound tenderness, No rigidity(Guarding) and No Palpable abdominal masses.  Liver:-Normal.  Spleen:- Normal.   Lower ext- Rt hamstring mild pain on palpation. Bilateral lower ext- no pedal edema. Negative homans signs.       Assessment & Plan:  For cramping of muscles recommend starting back on potassium tabs. Check potassium level today.  You mentioned fatigue so will get cbc.  For diarrhea bland diet, immodium over the counter, and propel hydration. If loos stools persist by Friday then turn in stool panel.  For cough and wheezing continue inhalers and adding 3 day taper  prednisone. If you are getting more infection symptoms then give antibiotic. Not needed presently and may loosen stools further.  Follow up in 7-10 days or as needed

## 2015-11-23 NOTE — Patient Instructions (Addendum)
For cramping of muscles recommend starting back on potassium tabs. Check potassium level today.  You mentioned fatigue so will get cbc.  For diarrhea bland diet, immodium over the counter, and propel hydration. If loose stools persist by Friday then turn in stool panel.  For cough and wheezing continue inhalers and adding 3 day taper prednisone. If you are getting more infection symptoms then give antibiotic. Not needed presently and may loosen stools further.  Follow up in 7-10 days or as needed

## 2015-11-23 NOTE — Progress Notes (Signed)
Pre visit review using our clinic review tool, if applicable. No additional management support is needed unless otherwise documented below in the visit note. 

## 2015-11-23 NOTE — Telephone Encounter (Signed)
Pt lvm at 7:46a stating that she has another appt at the same time as appt w/ Dr. Darene Lamer. Pt want to reschedule appt. Called pt back, lvm to call to reschedule. Currently not sure if pt will be making appt or not.

## 2015-11-24 LAB — CBC WITH DIFFERENTIAL/PLATELET
BASOS PCT: 0.3 % (ref 0.0–3.0)
Basophils Absolute: 0 10*3/uL (ref 0.0–0.1)
EOS PCT: 1.8 % (ref 0.0–5.0)
Eosinophils Absolute: 0.1 10*3/uL (ref 0.0–0.7)
HCT: 41.5 % (ref 36.0–46.0)
Hemoglobin: 13.6 g/dL (ref 12.0–15.0)
LYMPHS ABS: 1.4 10*3/uL (ref 0.7–4.0)
Lymphocytes Relative: 22.6 % (ref 12.0–46.0)
MCHC: 32.7 g/dL (ref 30.0–36.0)
MCV: 83.2 fl (ref 78.0–100.0)
MONO ABS: 0.9 10*3/uL (ref 0.1–1.0)
Monocytes Relative: 15.3 % — ABNORMAL HIGH (ref 3.0–12.0)
NEUTROS PCT: 60 % (ref 43.0–77.0)
Neutro Abs: 3.7 10*3/uL (ref 1.4–7.7)
Platelets: 221 10*3/uL (ref 150.0–400.0)
RBC: 4.99 Mil/uL (ref 3.87–5.11)
RDW: 14.5 % (ref 11.5–15.5)
WBC: 6.2 10*3/uL (ref 4.0–10.5)

## 2015-11-24 LAB — COMPREHENSIVE METABOLIC PANEL
ALT: 19 U/L (ref 0–35)
AST: 19 U/L (ref 0–37)
Albumin: 3.6 g/dL (ref 3.5–5.2)
Alkaline Phosphatase: 69 U/L (ref 39–117)
BUN: 16 mg/dL (ref 6–23)
CHLORIDE: 102 meq/L (ref 96–112)
CO2: 29 meq/L (ref 19–32)
Calcium: 9.4 mg/dL (ref 8.4–10.5)
Creatinine, Ser: 0.91 mg/dL (ref 0.40–1.20)
GFR: 83.42 mL/min (ref >60.00)
GLUCOSE: 102 mg/dL — AB (ref 70–99)
POTASSIUM: 3.7 meq/L (ref 3.5–5.1)
SODIUM: 140 meq/L (ref 135–145)
Total Bilirubin: 0.3 mg/dL (ref 0.2–1.2)
Total Protein: 7.1 g/dL (ref 6.0–8.3)

## 2015-11-24 LAB — MAGNESIUM: MAGNESIUM: 1.9 mg/dL (ref 1.5–2.5)

## 2015-11-25 ENCOUNTER — Telehealth: Payer: Self-pay | Admitting: Medical

## 2015-11-25 LAB — HM MAMMOGRAPHY

## 2015-11-25 NOTE — Telephone Encounter (Signed)
Pt left VM 11/24/15 4:20pm to call back about lab results, called and left msg thru interpreter for pt to call back, hold on the line, and request to speak with Barnet Pall. Pt is deaf and uses a Oceanographer.

## 2015-11-25 NOTE — Telephone Encounter (Signed)
Left message this morning with results for hearing services.

## 2015-12-06 ENCOUNTER — Encounter: Payer: Self-pay | Admitting: Family Medicine

## 2015-12-06 ENCOUNTER — Ambulatory Visit (INDEPENDENT_AMBULATORY_CARE_PROVIDER_SITE_OTHER): Admitting: Family Medicine

## 2015-12-06 VITALS — BP 126/80 | HR 85 | Ht 62.0 in | Wt 210.0 lb

## 2015-12-06 DIAGNOSIS — M67912 Unspecified disorder of synovium and tendon, left shoulder: Secondary | ICD-10-CM

## 2015-12-07 NOTE — Assessment & Plan Note (Signed)
consistent with rotator cuff strain.  Radiographs negative for fracture.  Mild improvement compared to last visit though exam is the same.  Still waiting for workers comp to approve physical therapy or MRI.  No changes to her light duty or treatment program until they approve these measures.  F/u in 2 months.

## 2015-12-07 NOTE — Progress Notes (Signed)
PCP: Annye Asa, MD Referred by Dr. Larose Kells  Subjective:   HPI: Patient is a 55 y.o. female here for left shoulder injury.  8/18: Interpreter used throughout visit. Patient reports injury occurred on 7/29 at work. She drives a forklift at work. Felt a pop in left shoulder while doing so - mentioned some of equipment was broken and she did not realize this. Seemed to be mild but later that day when doing sign language she felt a lot of pain in the left shoulder. Pain currently 7/10. Tried aleve, advil, icing. Has not had radiographs. + night pain. Is right handed. Remotely  Had problems with this joint - had an injection and completely improved.  9/22: Interpreter present for visit. Patient reports pain still 6/10 at work, 5/10 at rest. Has not heard yet about physical therapy. No other changes.  11/7: Interpreter present. Unfortunately patient still has not heard about physical therapy. Pain level 5/10. No skin changes, fever, other complaints. Using pain pills, occasional ice. Doing home exercises.  12/19: Patient reports only slight improvement from last visit - 4/10 level pain, dull lateral left shoulder. Is taking voltaren but needs a refill - this helps. Avoiding overhead activities when possible because they bother her. Doing home exercises. Has not heard from workers comp regarding physical therapy. No skin changes, other complaints.  12/06/15: Patient feels mildly improved though still has not had physical therapy approved nor an MRI. Pain level 2/10 at rest, dull lateral left shoulder. Worse with overhead motions. No skin changes, other complaints. Doing home exercises.  Past Medical History  Diagnosis Date  . Hypertension   . Deaf   . Asthma   . Thyroid disease   . Diabetes mellitus without complication [facility])     Current Outpatient Prescriptions on File Prior to Visit  Medication Sig Dispense Refill  . albuterol (PROVENTIL HFA;VENTOLIN HFA) 108  (90 BASE) MCG/ACT inhaler Inhale 2 puffs into the lungs every 6 (six) hours as needed for wheezing. 1 Inhaler 3  . amLODipine (NORVASC) 5 MG tablet TAKE 1 TABLET (5 MG TOTAL) BY MOUTH DAILY. 30 tablet 6  . cetirizine (ZYRTEC) 10 MG tablet TAKE 1 TABLET BY MOUTH EVERY DAY 30 tablet 4  . cyclobenzaprine (FLEXERIL) 5 MG tablet Take 1 tablet (5 mg total) by mouth at bedtime. 7 tablet 0  . diclofenac (VOLTAREN) 75 MG EC tablet Take 1 tablet (75 mg total) by mouth 2 (two) times daily. 60 tablet 1  . famotidine (PEPCID) 20 MG tablet Take 20 mg by mouth at bedtime.    . Fiber CHEW Chew 1 tablet by mouth daily.    . fluticasone (FLONASE) 50 MCG/ACT nasal spray PLACE 2 SPRAYS INTO BOTH NOSTRILS DAILY. 16 g 4  . hydrochlorothiazide (HYDRODIURIL) 12.5 MG tablet Take 1 tablet (12.5 mg total) by mouth daily. 90 tablet 1  . meclizine (ANTIVERT) 25 MG tablet Take 1 tablet (25 mg total) by mouth 3 (three) times daily as needed. 45 tablet 3  . metFORMIN (GLUCOPHAGE-XR) 500 MG 24 hr tablet TAKE 1 TABLET (500 MG TOTAL) BY MOUTH DAILY WITH BREAKFAST. 90 tablet 1  . methocarbamol (ROBAXIN) 500 MG tablet Take 1 tablet (500 mg total) by mouth 2 (two) times daily. 20 tablet 0  . mometasone-formoterol (DULERA) 100-5 MCG/ACT AERO Inhale 2 puffs into the lungs 2 (two) times daily. 8.8 g 3  . Multiple Vitamin (MULTIVITAMIN) tablet Take 1 tablet by mouth daily.    . nitroGLYCERIN (NITRODUR - DOSED IN MG/24 HR)  0.2 mg/hr patch Apply 1/4th patch to affected shoulder, change daily 30 patch 1  . potassium chloride SA (K-DUR,KLOR-CON) 20 MEQ tablet Take 1 tablet (20 mEq total) by mouth daily. 30 tablet 6  . predniSONE (DELTASONE) 20 MG tablet 1 tab po tid day 1, 1 tab po bid day 2, then 1 tab po day 3 6 tablet 0   No current facility-administered medications on file prior to visit.    Past Surgical History  Procedure Laterality Date  . Cesarean section    . Cardiac catheterization    . Esophageal manometry N/A 09/29/2013     Procedure: ESOPHAGEAL MANOMETRY (EM);  Surgeon: Milus Banister, MD;  Location: [facility];  Service: Endoscopy;  Laterality: N/A;    Allergies  Allergen Reactions  . Losartan Shortness Of Breath  . Augmentin [Amoxicillin-Pot Clavulanate] Diarrhea  . Oxycodone-Acetaminophen Nausea And Vomiting    Social History   Social History  . Marital Status: Married    Spouse Name: N/A  . Number of Children: N/A  . Years of Education: N/A   Occupational History  . diabled    Social History Main Topics  . Smoking status: Never Smoker   . Smokeless tobacco: Never Used  . Alcohol Use: No  . Drug Use: No  . Sexual Activity: Not on file   Other Topics Concern  . Not on file   Social History Narrative    Family History  Problem Relation Age of Onset  . Diabetes Mother   . Diabetes Father   . Heart disease Mother     BP 126/80 mmHg  Pulse 85  Ht 5\' 2"  (1.575 m)  Wt 210 lb (95.255 kg)  BMI 38.40 kg/m2  LMP 05/20/2012  Review of Systems: See HPI above.    Objective:  Physical Exam:  Gen: NAD  Left shoulder: No swelling, ecchymoses.  No gross deformity. Minimal TTP distal clavicle, anterior shoulder. FROM with painful arc. Mild positive Hawkins, Neers. Negative Speeds, Yergasons. Strength 5/5 with empty can and 5/5 resisted internal/external rotation.  Pain empty can. Negative apprehension. NV intact distally.  Right shoulder: FROM without pain.    Assessment & Plan:  1. Left shoulder injury - consistent with rotator cuff strain.  Radiographs negative for fracture.  Mild improvement compared to last visit though exam is the same.  Still waiting for workers comp to approve physical therapy or MRI.  No changes to her light duty or treatment program until they approve these measures.  F/u in 2 months.

## 2015-12-14 ENCOUNTER — Other Ambulatory Visit: Payer: Self-pay | Admitting: Family Medicine

## 2015-12-14 NOTE — Telephone Encounter (Signed)
Rx sent to the pharmacy by e-script.//AB/CMA 

## 2015-12-17 ENCOUNTER — Encounter: Payer: Self-pay | Admitting: General Practice

## 2015-12-17 ENCOUNTER — Ambulatory Visit (INDEPENDENT_AMBULATORY_CARE_PROVIDER_SITE_OTHER): Payer: Federal, State, Local not specified - PPO | Admitting: Medical

## 2015-12-17 ENCOUNTER — Encounter: Payer: Self-pay | Admitting: Medical

## 2015-12-17 ENCOUNTER — Ambulatory Visit (HOSPITAL_BASED_OUTPATIENT_CLINIC_OR_DEPARTMENT_OTHER)
Admission: RE | Admit: 2015-12-17 | Discharge: 2015-12-17 | Disposition: A | Discharge status: Home / Self Care | Payer: Federal, State, Local not specified - PPO | Source: Ambulatory Visit | Attending: Medical | Admitting: Medical

## 2015-12-17 ENCOUNTER — Other Ambulatory Visit: Payer: Self-pay | Admitting: Medical

## 2015-12-17 VITALS — BP 120/80 | HR 76 | Temp 98.2°F | Ht 62.0 in | Wt 248.0 lb

## 2015-12-17 DIAGNOSIS — R1032 Left lower quadrant pain: Secondary | ICD-10-CM | POA: Diagnosis not present

## 2015-12-17 DIAGNOSIS — M25552 Pain in left hip: Secondary | ICD-10-CM

## 2015-12-17 MED ORDER — DICLOFENAC SODIUM 75 MG PO TBEC: 75.0000 mg | DELAYED_RELEASE_TABLET | Freq: Two times a day (BID) | ORAL | Status: DC

## 2015-12-17 MED ORDER — TRAMADOL HCL 50 MG PO TABS: 50.0000 mg | ORAL_TABLET | Freq: Three times a day (TID) | ORAL | Status: DC | PRN

## 2015-12-17 MED ORDER — KETOROLAC TROMETHAMINE 60 MG/2ML IM SOLN
60.0000 mg | Freq: Once | INTRAMUSCULAR | Status: AC
  Administered 2015-12-17: 60 mg via INTRAMUSCULAR

## 2015-12-17 NOTE — Progress Notes (Signed)
Pre visit review using our clinic review tool, if applicable. No additional management support is needed unless otherwise documented below in the visit note. 

## 2015-12-17 NOTE — Progress Notes (Signed)
Subjective:    Patient ID: Rhonda Morrow, female    DOB: 1961-09-17, 55 y.o.   MRN: [id number]  HPI  Pt in for lt hip/groin area pain  Pt in with left groin area pain. She can't remember doing anything different. No heavy lifting or injury. She reports level 5 pain at rest. When she walks and get up will have 8/10 pain. No back pain.  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  Pain for about 7-10 days. Pt describe even simple activities such as cooking is difficult due to pain.    Review of Systems  Constitutional: Negative for fever, chills and fatigue.  Respiratory: Negative for cough, chest tightness, shortness of breath and wheezing.   Cardiovascular: Negative for chest pain and palpitations.  Gastrointestinal: Negative for abdominal pain.  Genitourinary: Negative for dysuria and frequency.  Musculoskeletal:       Lt hip and groin area pain.  Hematological: Negative for adenopathy. Does not bruise/bleed easily.  Psychiatric/Behavioral: Negative for behavioral problems and confusion.    Past Medical History  Diagnosis Date  . Hypertension   . Deaf   . Asthma   . Thyroid disease   . Diabetes mellitus without complication [facility])     Social History   Social History  . Marital Status: Married    Spouse Name: N/A  . Number of Children: N/A  . Years of Education: N/A   Occupational History  . diabled    Social History Main Topics  . Smoking status: Never Smoker   . Smokeless tobacco: Never Used  . Alcohol Use: No  . Drug Use: No  . Sexual Activity: Not on file   Other Topics Concern  . Not on file   Social History Narrative    Past Surgical History  Procedure Laterality Date  . Cesarean section    . Cardiac catheterization    . Esophageal manometry N/A 09/29/2013    Procedure: ESOPHAGEAL MANOMETRY (EM);  Surgeon: Milus Banister, MD;  Location: [facility];  Service: Endoscopy;  Laterality: N/A;    Family History  Problem Relation  Age of Onset  . Diabetes Mother   . Diabetes Father   . Heart disease Mother     Allergies  Allergen Reactions  . Losartan Shortness Of Breath  . Augmentin [Amoxicillin-Pot Clavulanate] Diarrhea  . Oxycodone-Acetaminophen Nausea And Vomiting    Current Outpatient Prescriptions on File Prior to Visit  Medication Sig Dispense Refill  . albuterol (PROVENTIL HFA;VENTOLIN HFA) 108 (90 BASE) MCG/ACT inhaler Inhale 2 puffs into the lungs every 6 (six) hours as needed for wheezing. 1 Inhaler 3  . amLODipine (NORVASC) 5 MG tablet TAKE 1 TABLET (5 MG TOTAL) BY MOUTH DAILY. 30 tablet 6  . cetirizine (ZYRTEC) 10 MG tablet TAKE 1 TABLET BY MOUTH EVERY DAY 30 tablet 4  . cyclobenzaprine (FLEXERIL) 5 MG tablet Take 1 tablet (5 mg total) by mouth at bedtime. 7 tablet 0  . famotidine (PEPCID) 20 MG tablet Take 20 mg by mouth at bedtime.    . Fiber CHEW Chew 1 tablet by mouth daily.    . fluticasone (FLONASE) 50 MCG/ACT nasal spray PLACE 2 SPRAYS INTO BOTH NOSTRILS DAILY. 16 g 4  . hydrochlorothiazide (HYDRODIURIL) 12.5 MG tablet Take 1 tablet (12.5 mg total) by mouth daily. 90 tablet 1  . meclizine (ANTIVERT) 25 MG tablet Take 1 tablet (25 mg total) by mouth 3 (three) times daily as needed. 45 tablet  3  . metFORMIN (GLUCOPHAGE-XR) 500 MG 24 hr tablet TAKE 1 TABLET (500 MG TOTAL) BY MOUTH DAILY WITH BREAKFAST. 90 tablet 1  . methocarbamol (ROBAXIN) 500 MG tablet Take 1 tablet (500 mg total) by mouth 2 (two) times daily. 20 tablet 0  . mometasone-formoterol (DULERA) 100-5 MCG/ACT AERO Inhale 2 puffs into the lungs 2 (two) times daily. 8.8 g 3  . Multiple Vitamin (MULTIVITAMIN) tablet Take 1 tablet by mouth daily.    . nitroGLYCERIN (NITRODUR - DOSED IN MG/24 HR) 0.2 mg/hr patch Apply 1/4th patch to affected shoulder, change daily 30 patch 1  . potassium chloride SA (K-DUR,KLOR-CON) 20 MEQ tablet Take 1 tablet (20 mEq total) by mouth daily. 30 tablet 6  . predniSONE (DELTASONE) 20 MG tablet 1 tab po tid  day 1, 1 tab po bid day 2, then 1 tab po day 3 6 tablet 0   No current facility-administered medications on file prior to visit.    BP 120/80 mmHg  Pulse 76  Temp(Src) 98.2 F (36.8 C) (Oral)  Ht 5\' 2"  (1.575 m)  Wt 248 lb (112.492 kg)  BMI 45.35 kg/m2  SpO2 97%  LMP 05/20/2012       Objective:   Physical Exam   General- No acute distress. Pleasant patient. Neck- Full range of motion, no jvd Lungs- Clear, even and unlabored. Heart- regular rate and rhythm. Neurologic- CNII- XII grossly intact.  Lt hip- pain directly on palpation and increased severe on rom. Pt has some groin area pain on palpation. No hernia felt on exam.  Note on getting up fom seating and walking demonstrates severe hip/groin area pain.           Assessment & Plan:  For hip and groin pain will give toradol 60 mg im.  Diclofenac rx  anti-inflammatory can start tomorrow.  Tramadol rx for severe pain.  Will get xray today.  Follow up mid week if signs and symptoms persist or worsen.

## 2015-12-17 NOTE — Patient Instructions (Addendum)
For hip and groin pain will give toradol 60 mg im.  Diclofenac rx  anti-inflammatory can start tomorrow.  Tramadol rx for severe pain.  Will get xray today.  Follow up mid week if signs and symptoms persist or worsen.

## 2015-12-20 ENCOUNTER — Encounter: Payer: Self-pay | Admitting: Medical

## 2015-12-20 ENCOUNTER — Other Ambulatory Visit: Payer: Self-pay | Admitting: Medical

## 2015-12-21 ENCOUNTER — Telehealth: Payer: Self-pay | Admitting: *Deleted

## 2015-12-21 NOTE — Telephone Encounter (Signed)
That was rx in January. I don't want to fill prednisone. She is hearing impaired and was in the other day for hip and groin pain.(I gave her some meds the other day) Would you send her my chart message asking her how she is doing with her hip pain. Or call sign service.

## 2015-12-21 NOTE — Telephone Encounter (Signed)
Left message on home phone for pt to call back about a medication refill.

## 2015-12-21 NOTE — Telephone Encounter (Signed)
Called and LMOM via [place] patient that I ws calling regarding her FMLA paperwork and to please see me in the office tomorrow morning after her appointment before leaving the office. I need to know if her personal FMLA paperwork is for ongoing medical issues with PCP and/or new issues seen by Mackie Pai, PA-C for treatment/SLS 02/14

## 2015-12-22 ENCOUNTER — Encounter: Payer: Self-pay | Admitting: Family Medicine

## 2015-12-22 ENCOUNTER — Ambulatory Visit (INDEPENDENT_AMBULATORY_CARE_PROVIDER_SITE_OTHER): Payer: Federal, State, Local not specified - PPO | Admitting: Medical

## 2015-12-22 ENCOUNTER — Ambulatory Visit (INDEPENDENT_AMBULATORY_CARE_PROVIDER_SITE_OTHER): Payer: Federal, State, Local not specified - PPO | Admitting: Family Medicine

## 2015-12-22 ENCOUNTER — Ambulatory Visit (HOSPITAL_BASED_OUTPATIENT_CLINIC_OR_DEPARTMENT_OTHER)
Admission: RE | Admit: 2015-12-22 | Discharge: 2015-12-22 | Disposition: A | Discharge status: Home / Self Care | Payer: Federal, State, Local not specified - PPO | Source: Ambulatory Visit | Attending: Medical | Admitting: Medical

## 2015-12-22 ENCOUNTER — Encounter: Payer: Self-pay | Admitting: Medical

## 2015-12-22 VITALS — BP 120/80 | HR 78 | Temp 97.9°F | Ht 62.0 in | Wt 243.8 lb

## 2015-12-22 VITALS — BP 147/96 | HR 74 | Ht 62.0 in | Wt 230.0 lb

## 2015-12-22 DIAGNOSIS — M25552 Pain in left hip: Secondary | ICD-10-CM | POA: Diagnosis not present

## 2015-12-22 DIAGNOSIS — R1032 Left lower quadrant pain: Secondary | ICD-10-CM

## 2015-12-22 MED ORDER — DICLOFENAC SODIUM 75 MG PO TBEC: 75.0000 mg | DELAYED_RELEASE_TABLET | Freq: Two times a day (BID) | ORAL | Status: DC

## 2015-12-22 NOTE — Patient Instructions (Signed)
Based on your exam I suspect your pain is due to an acute flare of arthritis with hip flexor strain. Go ahead with the ultrasound first to ensure you don't have a femoral hernia (I do not feel one on exam). If this test is normal I would recommend [place] setting up an intraarticular hip injection for you. Physical therapy is another option but with your pain being this severe I think you would need the injection first. Continue the diclofenac and tramadol in the meantime.

## 2015-12-22 NOTE — Patient Instructions (Addendum)
I did refill your diclofenac. Sent note/referal to see if sports medicine will evaluate groin area today as well. I did put in [place] order to evaluate left groin area/ if hernia possible?  Come down here after Sports med appointment and we will update you on when imaging study will be done.  Follow up here as 7-10 days or as needed

## 2015-12-22 NOTE — Telephone Encounter (Signed)
Spoke with patient via [place] and informed her that I needed to know if her FMLA for her personal issues were for her ongoing with Dr. Birdie Riddle [PCP] and/or if they were for new medical issues that she has been seeing Percell Miller for; patient would like these to be for ongoing issues with PCP, also informed patient that she is allowed to get new set of forms to forward to [facility] for the Hip issues she has been seeing him for. Patient understood & agreed; pt aware that her FMLA for the care of her spouse is completed and I will call her as soon as her FMLA for her own medical conditions is completed and ready for pick-up/SLS 02/14

## 2015-12-22 NOTE — Progress Notes (Signed)
Pre visit review using our clinic review tool, if applicable. No additional management support is needed unless otherwise documented below in the visit note. 

## 2015-12-22 NOTE — Progress Notes (Signed)
Subjective:    Patient ID: Rhonda Morrow, female    DOB: [birth year], 55 y.o.   MRN: [id number]  HPI  Pt in for persisting pain in left hip and groin area pain. Xray did not show any major changes. Only symmetric mild joint space narrowing.  Pt states sitting still comfortable but pain is still severe with moving and walking.  I reviewed xray with her with a Optometrist.   Pt has no diarrhea or fevers. No diverticulitis type presentation.  I rx'd tramadol the other day but diclofenac was not sent.   No injury or fall preceding the      Review of Systems  Constitutional: Negative for fever, chills and fatigue.  Respiratory: Negative for cough, chest tightness, shortness of breath and wheezing.   Cardiovascular: Negative for chest pain and palpitations.  Musculoskeletal: Negative for back pain.       Lt groin area pain.  Neurological: Negative for dizziness and headaches.  Hematological: Negative for adenopathy. Does not bruise/bleed easily.  Psychiatric/Behavioral: Negative for behavioral problems and confusion.   Past Medical History  Diagnosis Date  . Hypertension   . Deaf   . Asthma   . Thyroid disease   . Diabetes mellitus without complication [facility])     Social History   Social History  . Marital Status: Married    Spouse Name: N/A  . Number of Children: N/A  . Years of Education: N/A   Occupational History  . diabled    Social History Main Topics  . Smoking status: Never Smoker   . Smokeless tobacco: Never Used  . Alcohol Use: No  . Drug Use: No  . Sexual Activity: Not on file   Other Topics Concern  . Not on file   Social History Narrative    Past Surgical History  Procedure Laterality Date  . Cesarean section    . Cardiac catheterization    . Esophageal manometry N/A 09/29/2013    Procedure: ESOPHAGEAL MANOMETRY (EM);  Surgeon: Milus Banister, MD;  Location: [facility];  Service: Endoscopy;  Laterality: N/A;    Family History  Problem  Relation Age of Onset  . Diabetes Mother   . Diabetes Father   . Heart disease Mother     Allergies  Allergen Reactions  . Losartan Shortness Of Breath  . Augmentin [Amoxicillin-Pot Clavulanate] Diarrhea  . Oxycodone-Acetaminophen Nausea And Vomiting    Current Outpatient Prescriptions on File Prior to Visit  Medication Sig Dispense Refill  . albuterol (PROVENTIL HFA;VENTOLIN HFA) 108 (90 BASE) MCG/ACT inhaler Inhale 2 puffs into the lungs every 6 (six) hours as needed for wheezing. 1 Inhaler 3  . amLODipine (NORVASC) 5 MG tablet TAKE 1 TABLET (5 MG TOTAL) BY MOUTH DAILY. 30 tablet 6  . cetirizine (ZYRTEC) 10 MG tablet TAKE 1 TABLET BY MOUTH EVERY DAY 30 tablet 4  . cyclobenzaprine (FLEXERIL) 5 MG tablet Take 1 tablet (5 mg total) by mouth at bedtime. 7 tablet 0  . diclofenac (VOLTAREN) 75 MG EC tablet Take 1 tablet (75 mg total) by mouth 2 (two) times daily. 60 tablet 1  . famotidine (PEPCID) 20 MG tablet Take 20 mg by mouth at bedtime.    . Fiber CHEW Chew 1 tablet by mouth daily.    . fluticasone (FLONASE) 50 MCG/ACT nasal spray PLACE 2 SPRAYS INTO BOTH NOSTRILS DAILY. 16 g 4  . hydrochlorothiazide (HYDRODIURIL) 12.5 MG tablet Take 1 tablet (12.5 mg total) by mouth daily. 90 tablet 1  .  meclizine (ANTIVERT) 25 MG tablet Take 1 tablet (25 mg total) by mouth 3 (three) times daily as needed. 45 tablet 3  . metFORMIN (GLUCOPHAGE-XR) 500 MG 24 hr tablet TAKE 1 TABLET (500 MG TOTAL) BY MOUTH DAILY WITH BREAKFAST. 90 tablet 1  . methocarbamol (ROBAXIN) 500 MG tablet Take 1 tablet (500 mg total) by mouth 2 (two) times daily. 20 tablet 0  . mometasone-formoterol (DULERA) 100-5 MCG/ACT AERO Inhale 2 puffs into the lungs 2 (two) times daily. 8.8 g 3  . Multiple Vitamin (MULTIVITAMIN) tablet Take 1 tablet by mouth daily.    . nitroGLYCERIN (NITRODUR - DOSED IN MG/24 HR) 0.2 mg/hr patch Apply 1/4th patch to affected shoulder, change daily 30 patch 1  . potassium chloride SA (K-DUR,KLOR-CON) 20  MEQ tablet Take 1 tablet (20 mEq total) by mouth daily. 30 tablet 6  . predniSONE (DELTASONE) 20 MG tablet 1 tab po tid day 1, 1 tab po bid day 2, then 1 tab po day 3 6 tablet 0  . traMADol (ULTRAM) 50 MG tablet Take 1 tablet (50 mg total) by mouth every 8 (eight) hours as needed. 30 tablet 0   No current facility-administered medications on file prior to visit.    BP 120/80 mmHg  Pulse 78  Temp(Src) 97.9 F (36.6 C) (Oral)  Ht 5\' 2"  (1.575 m)  Wt 243 lb 12.8 oz (110.587 kg)  BMI 44.58 kg/m2  SpO2 98%  LMP 05/20/2012       Objective:   Physical Exam   General- No acute distress. Pleasant patient. Neck- Full range of motion, no jvd Lungs- Clear, even and unlabored. Heart- regular rate and rhythm. Neurologic- CNII- XII grossly intact.  Lt hip- pain directly on palpation and increased  on rom. Pt has some groin area pain on palpation. No hernia felt on exam.(but obese). Pt pain stills appears severe but appears some decreased.  Note on getting up fom seating and walking demonstrates severe hip/groin area pain.     Assessment & Plan:  I did refill your diclofenac. Sent note/referal to see if sports medicine will evaluate groin area today as well. I did put in [place] order to evaluate left groin area/ if hernia possible?  Come down here after Sports med appointment and we will update you on when imaging study will be done.  Follow up here as 7-10 days or as needed

## 2015-12-23 DIAGNOSIS — M25551 Pain in right hip: Secondary | ICD-10-CM | POA: Insufficient documentation

## 2015-12-23 DIAGNOSIS — M25552 Pain in left hip: Secondary | ICD-10-CM | POA: Insufficient documentation

## 2015-12-23 NOTE — Assessment & Plan Note (Signed)
Patient's exam indicates intraarticular pathology with secondary hip flexor strain.  Independently reviewed radiographs and she does have mild arthritis - this is most likely cause of majority of her pain.  She is going to get an ultrasound to assess for hernia though none felt on exam - if this is normal would recommend intraarticular cortisone injection given severity of her pain, extreme difficulty ambulating.  Continue diclofenac and tramadol.  Consider physical therapy.

## 2015-12-23 NOTE — Progress Notes (Signed)
PCP: Annye Asa, MD Consultation requested by: Mackie Pai PA-C   Subjective:   HPI: Patient is a 55 y.o. female here for left groin pain.  Patient reports she's had left groin pain that's worsened over past 2 weeks. No known injury or trauma. Pain 5/10, sharp. Worse with bearing weight. Taking diclofenac and tramadol. No prior issues with this hip. No swelling, bruising. Radiographs with mild DJD. No skin changes, fever, other complaints. No recent illness.  Past Medical History  Diagnosis Date  . Hypertension   . Deaf   . Asthma   . Thyroid disease   . Diabetes mellitus without complication [facility])     Current Outpatient Prescriptions on File Prior to Visit  Medication Sig Dispense Refill  . albuterol (PROVENTIL HFA;VENTOLIN HFA) 108 (90 BASE) MCG/ACT inhaler Inhale 2 puffs into the lungs every 6 (six) hours as needed for wheezing. 1 Inhaler 3  . amLODipine (NORVASC) 5 MG tablet TAKE 1 TABLET (5 MG TOTAL) BY MOUTH DAILY. 30 tablet 6  . cetirizine (ZYRTEC) 10 MG tablet TAKE 1 TABLET BY MOUTH EVERY DAY 30 tablet 4  . cyclobenzaprine (FLEXERIL) 5 MG tablet Take 1 tablet (5 mg total) by mouth at bedtime. 7 tablet 0  . famotidine (PEPCID) 20 MG tablet Take 20 mg by mouth at bedtime.    . Fiber CHEW Chew 1 tablet by mouth daily.    . fluticasone (FLONASE) 50 MCG/ACT nasal spray PLACE 2 SPRAYS INTO BOTH NOSTRILS DAILY. 16 g 4  . hydrochlorothiazide (HYDRODIURIL) 12.5 MG tablet Take 1 tablet (12.5 mg total) by mouth daily. 90 tablet 1  . meclizine (ANTIVERT) 25 MG tablet Take 1 tablet (25 mg total) by mouth 3 (three) times daily as needed. 45 tablet 3  . metFORMIN (GLUCOPHAGE-XR) 500 MG 24 hr tablet TAKE 1 TABLET (500 MG TOTAL) BY MOUTH DAILY WITH BREAKFAST. 90 tablet 1  . methocarbamol (ROBAXIN) 500 MG tablet Take 1 tablet (500 mg total) by mouth 2 (two) times daily. 20 tablet 0  . mometasone-formoterol (DULERA) 100-5 MCG/ACT AERO Inhale 2 puffs into the lungs 2 (two) times  daily. 8.8 g 3  . Multiple Vitamin (MULTIVITAMIN) tablet Take 1 tablet by mouth daily.    . nitroGLYCERIN (NITRODUR - DOSED IN MG/24 HR) 0.2 mg/hr patch Apply 1/4th patch to affected shoulder, change daily 30 patch 1  . potassium chloride SA (K-DUR,KLOR-CON) 20 MEQ tablet Take 1 tablet (20 mEq total) by mouth daily. 30 tablet 6  . predniSONE (DELTASONE) 20 MG tablet 1 tab po tid day 1, 1 tab po bid day 2, then 1 tab po day 3 6 tablet 0  . traMADol (ULTRAM) 50 MG tablet Take 1 tablet (50 mg total) by mouth every 8 (eight) hours as needed. 30 tablet 0   No current facility-administered medications on file prior to visit.    Past Surgical History  Procedure Laterality Date  . Cesarean section    . Cardiac catheterization    . Esophageal manometry N/A 09/29/2013    Procedure: ESOPHAGEAL MANOMETRY (EM);  Surgeon: Milus Banister, MD;  Location: [facility];  Service: Endoscopy;  Laterality: N/A;    Allergies  Allergen Reactions  . Losartan Shortness Of Breath  . Augmentin [Amoxicillin-Pot Clavulanate] Diarrhea  . Oxycodone-Acetaminophen Nausea And Vomiting    Social History   Social History  . Marital Status: Married    Spouse Name: N/A  . Number of Children: N/A  . Years of Education: N/A   Occupational History  .  diabled    Social History Main Topics  . Smoking status: Never Smoker   . Smokeless tobacco: Never Used  . Alcohol Use: No  . Drug Use: No  . Sexual Activity: Not on file   Other Topics Concern  . Not on file   Social History Narrative    Family History  Problem Relation Age of Onset  . Diabetes Mother   . Diabetes Father   . Heart disease Mother     BP 147/96 mmHg  Pulse 74  Ht 5\' 2"  (1.575 m)  Wt 230 lb (104.327 kg)  BMI 42.06 kg/m2  LMP 05/20/2012  Review of Systems: See HPI above.    Objective:  Physical Exam:  Gen: NAD  Back: No gross deformity, scoliosis. TTP anterior groin over hip joint.  No midline or bony TTP.  No paraspinal  tenderness. FROM. Strength LEs 5/5 all muscle groups except pain with hip flexion and abduction, 5-/5 strength Negative SLRs. Sensation intact to light touch bilaterally.  Left hip: No palpable mass at rest or with valsalva. Positive logroll.   Negative fabers and piriformis stretches (pain only in groin).    Assessment & Plan:  1. Left hip pain - Patient's exam indicates intraarticular pathology with secondary hip flexor strain.  Independently reviewed radiographs and she does have mild arthritis - this is most likely cause of majority of her pain.  She is going to get an ultrasound to assess for hernia though none felt on exam - if this is normal would recommend intraarticular cortisone injection given severity of her pain, extreme difficulty ambulating.  Continue diclofenac and tramadol.  Consider physical therapy.

## 2015-12-27 ENCOUNTER — Telehealth: Payer: Self-pay | Admitting: *Deleted

## 2015-12-27 NOTE — Telephone Encounter (Signed)
FMLA/Disability paperwork needs patient part filled out prior to healthcare provider, needs job description completed/SLS 02/20

## 2015-12-28 ENCOUNTER — Other Ambulatory Visit: Payer: Self-pay | Admitting: Family Medicine

## 2015-12-28 DIAGNOSIS — M25552 Pain in left hip: Secondary | ICD-10-CM

## 2015-12-31 ENCOUNTER — Ambulatory Visit (INDEPENDENT_AMBULATORY_CARE_PROVIDER_SITE_OTHER): Payer: Federal, State, Local not specified - PPO | Admitting: Medical

## 2015-12-31 ENCOUNTER — Encounter: Payer: Self-pay | Admitting: Medical

## 2015-12-31 ENCOUNTER — Telehealth: Payer: Self-pay | Admitting: Family Medicine

## 2015-12-31 VITALS — BP 120/80 | HR 78 | Temp 98.3°F | Ht 62.0 in | Wt 247.0 lb

## 2015-12-31 DIAGNOSIS — M25552 Pain in left hip: Secondary | ICD-10-CM | POA: Diagnosis not present

## 2015-12-31 NOTE — Progress Notes (Signed)
Pre visit review using our clinic review tool, if applicable. No additional management support is needed unless otherwise documented below in the visit note. 

## 2015-12-31 NOTE — Progress Notes (Signed)
Subjective:    Patient ID: Rhonda Morrow, female    DOB: September 13, 1961, 55 y.o.   MRN: [id number]  HPI   Pt states she is still  having pain in her left hip. She has level 3/10 pain now. But at work her pain is 5/10. Pt has been working. 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 But she does want fmla form filled for possible excacerbations in the future. Pt will see Dr Felipe Drone on Monday. Will get injection.  Review of Systems  Constitutional: Negative for fever, chills and fatigue.  Respiratory: Negative for cough, shortness of breath and wheezing.   Cardiovascular: Negative for chest pain and palpitations.  Musculoskeletal:       Lt hip pain   Past Medical History  Diagnosis Date  . Hypertension   . Deaf   . Asthma   . Thyroid disease   . Diabetes mellitus without complication [facility])     Social History   Social History  . Marital Status: Married    Spouse Name: N/A  . Number of Children: N/A  . Years of Education: N/A   Occupational History  . diabled    Social History Main Topics  . Smoking status: Never Smoker   . Smokeless tobacco: Never Used  . Alcohol Use: No  . Drug Use: No  . Sexual Activity: Not on file   Other Topics Concern  . Not on file   Social History Narrative    Past Surgical History  Procedure Laterality Date  . Cesarean section    . Cardiac catheterization    . Esophageal manometry N/A 09/29/2013    Procedure: ESOPHAGEAL MANOMETRY (EM);  Surgeon: Milus Banister, MD;  Location: [facility];  Service: Endoscopy;  Laterality: N/A;    Family History  Problem Relation Age of Onset  . Diabetes Mother   . Diabetes Father   . Heart disease Mother     Allergies  Allergen Reactions  . Losartan Shortness Of Breath  . Augmentin [Amoxicillin-Pot Clavulanate] Diarrhea  . Oxycodone-Acetaminophen Nausea And Vomiting    Current Outpatient Prescriptions on File Prior to Visit   Medication Sig Dispense Refill  . albuterol (PROVENTIL HFA;VENTOLIN HFA) 108 (90 BASE) MCG/ACT inhaler Inhale 2 puffs into the lungs every 6 (six) hours as needed for wheezing. 1 Inhaler 3  . amLODipine (NORVASC) 5 MG tablet TAKE 1 TABLET (5 MG TOTAL) BY MOUTH DAILY. 30 tablet 6  . cetirizine (ZYRTEC) 10 MG tablet TAKE 1 TABLET BY MOUTH EVERY DAY 30 tablet 4  . cyclobenzaprine (FLEXERIL) 5 MG tablet Take 1 tablet (5 mg total) by mouth at bedtime. 7 tablet 0  . diclofenac (VOLTAREN) 75 MG EC tablet Take 1 tablet (75 mg total) by mouth 2 (two) times daily. 60 tablet 1  . famotidine (PEPCID) 20 MG tablet Take 20 mg by mouth at bedtime.    . Fiber CHEW Chew 1 tablet by mouth daily.    . fluticasone (FLONASE) 50 MCG/ACT nasal spray PLACE 2 SPRAYS INTO BOTH NOSTRILS DAILY. 16 g 4  . hydrochlorothiazide (HYDRODIURIL) 12.5 MG tablet Take 1 tablet (12.5 mg total) by mouth daily. 90 tablet 1  . meclizine (ANTIVERT) 25 MG tablet Take 1 tablet (25 mg total) by mouth 3 (three) times daily as needed. 45 tablet 3  . metFORMIN (GLUCOPHAGE-XR) 500 MG 24 hr tablet TAKE 1 TABLET (500 MG TOTAL) BY MOUTH DAILY WITH BREAKFAST.  90 tablet 1  . methocarbamol (ROBAXIN) 500 MG tablet Take 1 tablet (500 mg total) by mouth 2 (two) times daily. 20 tablet 0  . mometasone-formoterol (DULERA) 100-5 MCG/ACT AERO Inhale 2 puffs into the lungs 2 (two) times daily. 8.8 g 3  . Multiple Vitamin (MULTIVITAMIN) tablet Take 1 tablet by mouth daily.    . nitroGLYCERIN (NITRODUR - DOSED IN MG/24 HR) 0.2 mg/hr patch Apply 1/4th patch to affected shoulder, change daily 30 patch 1  . potassium chloride SA (K-DUR,KLOR-CON) 20 MEQ tablet Take 1 tablet (20 mEq total) by mouth daily. 30 tablet 6  . predniSONE (DELTASONE) 20 MG tablet 1 tab po tid day 1, 1 tab po bid day 2, then 1 tab po day 3 6 tablet 0  . traMADol (ULTRAM) 50 MG tablet Take 1 tablet (50 mg total) by mouth every 8 (eight) hours as needed. 30 tablet 0   No current  facility-administered medications on file prior to visit.    BP 120/80 mmHg  Pulse 78  Temp(Src) 98.3 F (36.8 C) (Oral)  Ht 5\' 2"  (1.575 m)  Wt 247 lb (112.038 kg)  BMI 45.17 kg/m2  SpO2 98%  LMP 05/20/2012       Objective:   Physical Exam   General- No acute distress. Pleasant patient. Lungs- Clear, even and unlabored. Heart- regular rate and rhythm. Neurologic- CNII- XII grossly intact.  Lt hip- pain on palpation directly and pain on rom.       Assessment & Plan:  For your left hip pain. Continue diclofenac and tramadol.  Get injection with sports medicine next week.  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   I will start to work on your fmla form and estimate will be ready by Tuesday.   Follow up with [place] as needed after sports medicine

## 2015-12-31 NOTE — Telephone Encounter (Signed)
Caller name:Toniette Relationship to patient:selfCan be reached: Pharmacy:  Reason for call:patient dropped off a form for restrictions for work for Percell Miller to fill out  Placed form and her cover form in the basket for [employer]

## 2015-12-31 NOTE — Patient Instructions (Signed)
For your left hip pain. Continue diclofenac and tramadol.  Get injection with sports medicine next week.  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   I will start to work on your fmla form and estimate will be ready by Tuesday.   Follow up with [place] as needed after sports medicine

## 2015-12-31 NOTE — Telephone Encounter (Signed)
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 Looked about 10 days or more. Dr. Etter Sjogren filled out fmla form. I did not see pt for this. Husbund was sick. Pt refused to sign form until she get note for all exucsed days. I got sharon to make copies of letter regarding day she missed and copy of her fmla form. I did this to make sure days she needs excused correspond with fmla paper work. Will you look at these on Monday. Pt indicated she needs this done by next week.

## 2016-01-03 ENCOUNTER — Ambulatory Visit
Admission: RE | Admit: 2016-01-03 | Discharge: 2016-01-03 | Disposition: A | Discharge status: Home / Self Care | Payer: Federal, State, Local not specified - PPO | Source: Ambulatory Visit | Attending: Family Medicine | Admitting: Family Medicine

## 2016-01-03 DIAGNOSIS — M25552 Pain in left hip: Secondary | ICD-10-CM

## 2016-01-03 MED ORDER — METHYLPREDNISOLONE ACETATE 40 MG/ML INJ SUSP (RADIOLOG
120.0000 mg | Freq: Once | INTRAMUSCULAR | Status: AC
  Administered 2016-01-03: 120 mg via INTRA_ARTICULAR

## 2016-01-03 MED ORDER — IOHEXOL 180 MG/ML  SOLN
1.0000 mL | Freq: Once | INTRAMUSCULAR | Status: AC | PRN
  Administered 2016-01-03: 1 mL via INTRA_ARTICULAR

## 2016-01-03 NOTE — Telephone Encounter (Signed)
Spoke with patient and this paperwork has nothing to do with her Husband. She needs a restrictions letter. 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 She needs the letter to be more specific. She said she works 8 hour days and would like the note to say light duty, minimum standing and walking because of the pain. She said she is willing to do a job where she is sitting and she can not lift. She is willing to walk up to 2-3 hours. She asked that we not get her paperwork mixed up with her husbands.     KP

## 2016-01-03 NOTE — Telephone Encounter (Signed)
I filled out pt forms regarding fmla for her hip. And work restiction sheet for her hip. She can pick those up.Is on your key board.

## 2016-01-03 NOTE — Telephone Encounter (Signed)
Husband has been very sick-- ok to give note

## 2016-01-04 LAB — HM DIABETES EYE EXAM

## 2016-01-04 NOTE — Telephone Encounter (Signed)
Pt called in to check the status of paperwork.  Informed pt of the below. She will come by to pick up.

## 2016-01-05 ENCOUNTER — Encounter: Payer: Self-pay | Admitting: Family Medicine

## 2016-01-06 ENCOUNTER — Other Ambulatory Visit: Payer: Self-pay | Admitting: Medical

## 2016-01-06 ENCOUNTER — Encounter: Payer: Self-pay | Admitting: Family Medicine

## 2016-01-06 DIAGNOSIS — M25552 Pain in left hip: Secondary | ICD-10-CM

## 2016-01-06 NOTE — Telephone Encounter (Signed)
I printed tramadol rx refill.  Pt can pick up.

## 2016-01-24 ENCOUNTER — Ambulatory Visit (INDEPENDENT_AMBULATORY_CARE_PROVIDER_SITE_OTHER): Payer: Federal, State, Local not specified - PPO | Admitting: Medical

## 2016-01-24 ENCOUNTER — Encounter: Payer: Self-pay | Admitting: Medical

## 2016-01-24 ENCOUNTER — Telehealth: Payer: Self-pay | Admitting: Family Medicine

## 2016-01-24 VITALS — BP 120/80 | HR 77 | Temp 98.0°F | Ht 62.0 in | Wt 237.0 lb

## 2016-01-24 DIAGNOSIS — M25552 Pain in left hip: Secondary | ICD-10-CM | POA: Diagnosis not present

## 2016-01-24 MED ORDER — TRAMADOL HCL 50 MG PO TABS: 50.0000 mg | ORAL_TABLET | Freq: Three times a day (TID) | ORAL | Status: DC | PRN

## 2016-01-24 MED ORDER — DICLOFENAC SODIUM 75 MG PO TBEC: 75.0000 mg | DELAYED_RELEASE_TABLET | Freq: Two times a day (BID) | ORAL | Status: DC

## 2016-01-24 NOTE — Telephone Encounter (Signed)
I did not call her. Maybe it was the PT department.

## 2016-01-24 NOTE — Progress Notes (Signed)
Pre visit review using our clinic review tool, if applicable. No additional management support is needed unless otherwise documented below in the visit note. 

## 2016-01-24 NOTE — Telephone Encounter (Signed)
Please advise 

## 2016-01-24 NOTE — Telephone Encounter (Signed)
Caller Name: Friscia,Bernard L Relation to PO:[phone] Call back number:[phone]   Reason for call:   Spouse states she received a missed call no message was left. Patient was seen today by Percell Miller

## 2016-01-24 NOTE — Patient Instructions (Addendum)
Continue light duty and follow up with sports medicine on the March 30th.  I want you to diclofenac at 6 am and 6 pm. Do not use alleve if on diclofenac.  Then you can use tramadol 1 tab   Every 8 hours approximate 6 am, 2 pm and later around 10 pm.  Follow up with me in 2 wks after you see sports medicine.

## 2016-01-24 NOTE — Progress Notes (Signed)
Subjective:    Patient ID: Rhonda Morrow, female    DOB: 06/17/1961, 55 y.o.   MRN: [id number]  HPI   Pt in for follow up. Pt states pain in the morning seems to be the worse. Pt will take 2  Diclofenac in am and it will help some. She takes it about 6:30 am. Later the pain will come back later around 2:20.  Tramadol she takes  only one time daily. She also describes occasional use of alleve as well.  Pt states Dr. Felipe Drone injection only helped briefly.  Pt will see /follow up with sports medicine on February 03, 2016.   Pt states about 50% better than last time.  Pt at work pain is increased. Her work schedule is 8 hours.   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       Review of Systems  Constitutional: Negative for fever, chills and fatigue.  Respiratory: Negative for cough, choking, shortness of breath and wheezing.   Cardiovascular: Negative for chest pain and palpitations.  Gastrointestinal: Negative for abdominal pain.  Musculoskeletal:       Lt  Hip pain.  Hematological: Negative for adenopathy. Does not bruise/bleed easily.    Past Medical History  Diagnosis Date  . Hypertension   . Deaf   . Asthma   . Thyroid disease   . Diabetes mellitus without complication [facility])     Social History   Social History  . Marital Status: Married    Spouse Name: N/A  . Number of Children: N/A  . Years of Education: N/A   Occupational History  . diabled    Social History Main Topics  . Smoking status: Never Smoker   . Smokeless tobacco: Never Used  . Alcohol Use: No  . Drug Use: No  . Sexual Activity: Not on file   Other Topics Concern  . Not on file   Social History Narrative    Past Surgical History  Procedure Laterality Date  . Cesarean section    . Cardiac catheterization    . Esophageal manometry N/A 09/29/2013    Procedure: ESOPHAGEAL MANOMETRY (EM);  Surgeon: Milus Banister, MD;  Location: [facility];  Service: Endoscopy;  Laterality: N/A;     Family History  Problem Relation Age of Onset  . Diabetes Mother   . Diabetes Father   . Heart disease Mother     Allergies  Allergen Reactions  . Losartan Shortness Of Breath  . Augmentin [Amoxicillin-Pot Clavulanate] Diarrhea  . Oxycodone-Acetaminophen Nausea And Vomiting    Current Outpatient Prescriptions on File Prior to Visit  Medication Sig Dispense Refill  . albuterol (PROVENTIL HFA;VENTOLIN HFA) 108 (90 BASE) MCG/ACT inhaler Inhale 2 puffs into the lungs every 6 (six) hours as needed for wheezing. 1 Inhaler 3  . amLODipine (NORVASC) 5 MG tablet TAKE 1 TABLET (5 MG TOTAL) BY MOUTH DAILY. 30 tablet 6  . cetirizine (ZYRTEC) 10 MG tablet TAKE 1 TABLET BY MOUTH EVERY DAY 30 tablet 4  . cyclobenzaprine (FLEXERIL) 5 MG tablet Take 1 tablet (5 mg total) by mouth at bedtime. 7 tablet 0  . famotidine (PEPCID) 20 MG tablet Take 20 mg by mouth at bedtime.    . Fiber CHEW Chew 1 tablet by mouth daily.    . fluticasone (FLONASE) 50 MCG/ACT nasal spray PLACE 2 SPRAYS INTO BOTH NOSTRILS DAILY. 16 g 4  . hydrochlorothiazide (HYDRODIURIL) 12.5 MG tablet Take 1 tablet (12.5 mg total) by mouth daily. [place]  tablet 1  . meclizine (ANTIVERT) 25 MG tablet Take 1 tablet (25 mg total) by mouth 3 (three) times daily as needed. 45 tablet 3  . metFORMIN (GLUCOPHAGE-XR) 500 MG 24 hr tablet TAKE 1 TABLET (500 MG TOTAL) BY MOUTH DAILY WITH BREAKFAST. 90 tablet 1  . methocarbamol (ROBAXIN) 500 MG tablet Take 1 tablet (500 mg total) by mouth 2 (two) times daily. 20 tablet 0  . mometasone-formoterol (DULERA) 100-5 MCG/ACT AERO Inhale 2 puffs into the lungs 2 (two) times daily. 8.8 g 3  . Multiple Vitamin (MULTIVITAMIN) tablet Take 1 tablet by mouth daily.    . nitroGLYCERIN (NITRODUR - DOSED IN MG/24 HR) 0.2 mg/hr patch Apply 1/4th patch to affected shoulder, change daily 30 patch 1  . potassium chloride SA (K-DUR,KLOR-CON) 20 MEQ tablet Take 1 tablet (20 mEq total) by mouth daily. 30 tablet 6  . predniSONE  (DELTASONE) 20 MG tablet 1 tab po tid day 1, 1 tab po bid day 2, then 1 tab po day 3 6 tablet 0   No current facility-administered medications on file prior to visit.    BP 120/80 mmHg  Pulse 77  Temp(Src) 98 F (36.7 C) (Oral)  Ht 5\' 2"  (1.575 m)  Wt 237 lb (107.502 kg)  BMI 43.34 kg/m2  SpO2 98%  LMP 05/20/2012       Objective:   Physical Exam General- No acute distress. Pleasant patient. Lungs- Clear, even and unlabored. Heart- regular rate and rhythm. Neurologic- CNII- XII grossly intact.  Lt hip- pain on palpation directly less than before but on rom still has moderate to seveer pain.       Assessment & Plan:  Continue light duty and follow up with sports medicine on the March 30th.  I want you to use  diclofenac at 6 am and 6 pm. Do not use alleve if on diclofenac.  Then you can use tramadol 1 tab   Every 8 hours approximate 6 am, 2 pm and later around 10 pm.  Follow up with me in 2 wks after you see sports medicine.  I did go ahead and contact PT/re-refer. Since it appears she saw then one time but then they never contacted her for second session??

## 2016-01-26 NOTE — Telephone Encounter (Signed)
Pt was never referred to PT. So when she is for follow up will mention that is what I was told by PT department. I thought maybe Sports med referred based on what pt had told me? Will clarigfy with pt when she is back in?

## 2016-01-28 ENCOUNTER — Ambulatory Visit: Payer: Self-pay | Admitting: Family Medicine

## 2016-02-03 ENCOUNTER — Ambulatory Visit (INDEPENDENT_AMBULATORY_CARE_PROVIDER_SITE_OTHER): Payer: Federal, State, Local not specified - PPO | Admitting: Family Medicine

## 2016-02-03 ENCOUNTER — Encounter: Payer: Self-pay | Admitting: Family Medicine

## 2016-02-03 VITALS — BP 123/79 | HR 69 | Ht 64.0 in | Wt 234.0 lb

## 2016-02-03 DIAGNOSIS — M67912 Unspecified disorder of synovium and tendon, left shoulder: Secondary | ICD-10-CM | POA: Diagnosis not present

## 2016-02-03 NOTE — Progress Notes (Signed)
PCP: Annye Asa, MD Referred by Dr. Larose Kells  Subjective:   HPI: Patient is a 55 y.o. female here for left shoulder injury.  8/18: Interpreter used throughout visit. Patient reports injury occurred on 7/29 at work. She drives a forklift at work. Felt a pop in left shoulder while doing so - mentioned some of equipment was broken and she did not realize this. Seemed to be mild but later that day when doing sign language she felt a lot of pain in the left shoulder. Pain currently 7/10. Tried aleve, advil, icing. Has not had radiographs. + night pain. Is right handed. Remotely  Had problems with this joint - had an injection and completely improved.  9/22: Interpreter present for visit. Patient reports pain still 6/10 at work, 5/10 at rest. Has not heard yet about physical therapy. No other changes.  11/7: Interpreter present. Unfortunately patient still has not heard about physical therapy. Pain level 5/10. No skin changes, fever, other complaints. Using pain pills, occasional ice. Doing home exercises.  12/19: Patient reports only slight improvement from last visit - 4/10 level pain, dull lateral left shoulder. Is taking voltaren but needs a refill - this helps. Avoiding overhead activities when possible because they bother her. Doing home exercises. Has not heard from workers comp regarding physical therapy. No skin changes, other complaints.  12/06/15: Patient feels mildly improved though still has not had physical therapy approved nor an MRI. Pain level 2/10 at rest, dull lateral left shoulder. Worse with overhead motions. No skin changes, other complaints. Doing home exercises.  3/30: Patient reports she has mainly been resting her shoulder now. No pain currently - gets some soreness with full motions. Pain level 0/10. No skin changes, fever, other complaints. No radiation.  Past Medical History  Diagnosis Date  . Hypertension   . Deaf   . Asthma   .  Thyroid disease   . Diabetes mellitus without complication [facility])     Current Outpatient Prescriptions on File Prior to Visit  Medication Sig Dispense Refill  . albuterol (PROVENTIL HFA;VENTOLIN HFA) 108 (90 BASE) MCG/ACT inhaler Inhale 2 puffs into the lungs every 6 (six) hours as needed for wheezing. 1 Inhaler 3  . amLODipine (NORVASC) 5 MG tablet TAKE 1 TABLET (5 MG TOTAL) BY MOUTH DAILY. 30 tablet 6  . cetirizine (ZYRTEC) 10 MG tablet TAKE 1 TABLET BY MOUTH EVERY DAY 30 tablet 4  . cyclobenzaprine (FLEXERIL) 5 MG tablet Take 1 tablet (5 mg total) by mouth at bedtime. 7 tablet 0  . diclofenac (VOLTAREN) 75 MG EC tablet Take 1 tablet (75 mg total) by mouth 2 (two) times daily. 60 tablet 1  . famotidine (PEPCID) 20 MG tablet Take 20 mg by mouth at bedtime.    . Fiber CHEW Chew 1 tablet by mouth daily.    . fluticasone (FLONASE) 50 MCG/ACT nasal spray PLACE 2 SPRAYS INTO BOTH NOSTRILS DAILY. 16 g 4  . hydrochlorothiazide (HYDRODIURIL) 12.5 MG tablet Take 1 tablet (12.5 mg total) by mouth daily. 90 tablet 1  . meclizine (ANTIVERT) 25 MG tablet Take 1 tablet (25 mg total) by mouth 3 (three) times daily as needed. 45 tablet 3  . metFORMIN (GLUCOPHAGE-XR) 500 MG 24 hr tablet TAKE 1 TABLET (500 MG TOTAL) BY MOUTH DAILY WITH BREAKFAST. 90 tablet 1  . methocarbamol (ROBAXIN) 500 MG tablet Take 1 tablet (500 mg total) by mouth 2 (two) times daily. 20 tablet 0  . mometasone-formoterol (DULERA) 100-5 MCG/ACT AERO Inhale 2 puffs  into the lungs 2 (two) times daily. 8.8 g 3  . Multiple Vitamin (MULTIVITAMIN) tablet Take 1 tablet by mouth daily.    . nitroGLYCERIN (NITRODUR - DOSED IN MG/24 HR) 0.2 mg/hr patch Apply 1/4th patch to affected shoulder, change daily 30 patch 1  . potassium chloride SA (K-DUR,KLOR-CON) 20 MEQ tablet Take 1 tablet (20 mEq total) by mouth daily. 30 tablet 6  . predniSONE (DELTASONE) 20 MG tablet 1 tab po tid day 1, 1 tab po bid day 2, then 1 tab po day 3 6 tablet 0  . traMADol  (ULTRAM) 50 MG tablet Take 1 tablet (50 mg total) by mouth every 8 (eight) hours as needed. 30 tablet 0   No current facility-administered medications on file prior to visit.    Past Surgical History  Procedure Laterality Date  . Cesarean section    . Cardiac catheterization    . Esophageal manometry N/A 09/29/2013    Procedure: ESOPHAGEAL MANOMETRY (EM);  Surgeon: Milus Banister, MD;  Location: [facility];  Service: Endoscopy;  Laterality: N/A;    Allergies  Allergen Reactions  . Losartan Shortness Of Breath  . Augmentin [Amoxicillin-Pot Clavulanate] Diarrhea  . Oxycodone-Acetaminophen Nausea And Vomiting    Social History   Social History  . Marital Status: Married    Spouse Name: N/A  . Number of Children: N/A  . Years of Education: N/A   Occupational History  . diabled    Social History Main Topics  . Smoking status: Never Smoker   . Smokeless tobacco: Never Used  . Alcohol Use: No  . Drug Use: No  . Sexual Activity: Not on file   Other Topics Concern  . Not on file   Social History Narrative    Family History  Problem Relation Age of Onset  . Diabetes Mother   . Diabetes Father   . Heart disease Mother     BP 123/79 mmHg  Pulse 69  Ht 5\' 4"  (1.626 m)  Wt 234 lb (106.142 kg)  BMI 40.15 kg/m2  LMP 05/20/2012  Review of Systems: See HPI above.    Objective:  Physical Exam:  Gen: NAD  Left shoulder: No swelling, ecchymoses.  No gross deformity. No TTP. FROM without painful arc. Minimal positive Hawkins, negative Neers. Negative Speeds, Yergasons. Strength 5/5 with empty can and 5/5 resisted internal/external rotation.  Pain empty can. Negative apprehension. NV intact distally.  Right shoulder: FROM without pain.    Assessment & Plan:  1. Left shoulder injury - consistent with rotator cuff strain.  Radiographs negative for fracture.  Has improved since last visit.  Discussed continuing home exercises another 6 weeks.  Tylenol/nsaids  if needed.  F/u in 6 weeks for likely final visit to ensure no problems after returning to full duty today.

## 2016-02-03 NOTE — Addendum Note (Signed)
Addended by: Sherrie George F on: 02/03/2016 10:25 AM   Modules accepted: Orders

## 2016-02-03 NOTE — Assessment & Plan Note (Signed)
consistent with rotator cuff strain.  Radiographs negative for fracture.  Has improved since last visit.  Discussed continuing home exercises another 6 weeks.  Tylenol/nsaids if needed.  F/u in 6 weeks for likely final visit to ensure no problems after returning to full duty today.

## 2016-02-03 NOTE — Patient Instructions (Signed)
Your shoulder is doing a lot better. We will return you to full duty at this time. Usually we'd recommend doing home exercises a few times a week for 6 more weeks though. I don't think at this time you need physical therapy or an MRI for your shoulder though separately you will be doing this for your hip.

## 2016-02-08 ENCOUNTER — Ambulatory Visit (HOSPITAL_BASED_OUTPATIENT_CLINIC_OR_DEPARTMENT_OTHER)
Admission: RE | Admit: 2016-02-08 | Discharge: 2016-02-08 | Disposition: A | Discharge status: Home / Self Care | Payer: Federal, State, Local not specified - PPO | Source: Ambulatory Visit | Attending: Family Medicine | Admitting: Family Medicine

## 2016-02-08 ENCOUNTER — Ambulatory Visit (INDEPENDENT_AMBULATORY_CARE_PROVIDER_SITE_OTHER): Payer: Federal, State, Local not specified - PPO | Admitting: Family Medicine

## 2016-02-08 ENCOUNTER — Ambulatory Visit: Payer: Federal, State, Local not specified - PPO | Attending: Family Medicine | Admitting: Physical Therapy

## 2016-02-08 ENCOUNTER — Encounter: Payer: Self-pay | Admitting: Family Medicine

## 2016-02-08 VITALS — BP 122/78 | HR 75 | Temp 98.5°F | Wt 232.2 lb

## 2016-02-08 DIAGNOSIS — Z889 Allergy status to unspecified drugs, medicaments and biological substances status: Secondary | ICD-10-CM

## 2016-02-08 DIAGNOSIS — E1151 Type 2 diabetes mellitus with diabetic peripheral angiopathy without gangrene: Secondary | ICD-10-CM

## 2016-02-08 DIAGNOSIS — M545 Low back pain: Secondary | ICD-10-CM

## 2016-02-08 DIAGNOSIS — R262 Difficulty in walking, not elsewhere classified: Secondary | ICD-10-CM | POA: Diagnosis not present

## 2016-02-08 DIAGNOSIS — M461 Sacroiliitis, not elsewhere classified: Secondary | ICD-10-CM | POA: Diagnosis not present

## 2016-02-08 DIAGNOSIS — Z9109 Other allergy status, other than to drugs and biological substances: Secondary | ICD-10-CM | POA: Diagnosis not present

## 2016-02-08 DIAGNOSIS — M25552 Pain in left hip: Secondary | ICD-10-CM

## 2016-02-08 DIAGNOSIS — E785 Hyperlipidemia, unspecified: Secondary | ICD-10-CM

## 2016-02-08 DIAGNOSIS — Z114 Encounter for screening for human immunodeficiency virus [HIV]: Secondary | ICD-10-CM

## 2016-02-08 DIAGNOSIS — G8929 Other chronic pain: Secondary | ICD-10-CM

## 2016-02-08 DIAGNOSIS — Z1159 Encounter for screening for other viral diseases: Secondary | ICD-10-CM

## 2016-02-08 DIAGNOSIS — E1165 Type 2 diabetes mellitus with hyperglycemia: Secondary | ICD-10-CM | POA: Diagnosis not present

## 2016-02-08 DIAGNOSIS — M25652 Stiffness of left hip, not elsewhere classified: Secondary | ICD-10-CM | POA: Diagnosis not present

## 2016-02-08 DIAGNOSIS — E559 Vitamin D deficiency, unspecified: Secondary | ICD-10-CM | POA: Diagnosis not present

## 2016-02-08 DIAGNOSIS — E1149 Type 2 diabetes mellitus with other diabetic neurological complication: Secondary | ICD-10-CM

## 2016-02-08 DIAGNOSIS — M546 Pain in thoracic spine: Secondary | ICD-10-CM | POA: Diagnosis not present

## 2016-02-08 DIAGNOSIS — M5116 Intervertebral disc disorders with radiculopathy, lumbar region: Secondary | ICD-10-CM | POA: Diagnosis not present

## 2016-02-08 DIAGNOSIS — R1310 Dysphagia, unspecified: Secondary | ICD-10-CM

## 2016-02-08 DIAGNOSIS — R2689 Other abnormalities of gait and mobility: Secondary | ICD-10-CM | POA: Diagnosis not present

## 2016-02-08 DIAGNOSIS — IMO0002 Reserved for concepts with insufficient information to code with codable children: Secondary | ICD-10-CM

## 2016-02-08 DIAGNOSIS — I10 Essential (primary) hypertension: Secondary | ICD-10-CM

## 2016-02-08 LAB — POCT URINALYSIS DIPSTICK
BILIRUBIN UA: NEGATIVE
Blood, UA: NEGATIVE
Glucose, UA: NEGATIVE
KETONES UA: NEGATIVE
LEUKOCYTES UA: NEGATIVE
NITRITE UA: NEGATIVE
Spec Grav, UA: 1.015
Urobilinogen, UA: 1
pH, UA: 6.5

## 2016-02-08 MED ORDER — TRAMADOL HCL 50 MG PO TABS: 50.0000 mg | ORAL_TABLET | Freq: Three times a day (TID) | ORAL | Status: DC | PRN

## 2016-02-08 MED ORDER — METFORMIN HCL ER 500 MG PO TB24: ORAL_TABLET | ORAL | Status: DC

## 2016-02-08 MED ORDER — HYDROCHLOROTHIAZIDE 12.5 MG PO TABS: 12.5000 mg | ORAL_TABLET | Freq: Every day | ORAL | Status: DC

## 2016-02-08 MED ORDER — CETIRIZINE HCL 10 MG PO TABS: 10.0000 mg | ORAL_TABLET | Freq: Every day | ORAL | Status: DC

## 2016-02-08 NOTE — Progress Notes (Signed)
Pre visit review using our clinic review tool, if applicable. No additional management support is needed unless otherwise documented below in the visit note. 

## 2016-02-08 NOTE — Patient Instructions (Signed)

## 2016-02-08 NOTE — Progress Notes (Signed)
Patient ID: Rhonda Morrow, female    DOB: [birth year]  Age: 55 y.o. MRN: [id number]    Subjective:  Subjective HPI Rhonda Morrow presents for f/u dm, cholesterol , htn  Pt also c/o back pain that comes and goes x 1 year.  No known injury that started it but she notices it most when she lifts something.    HYPERTENSION  Blood pressure range-good today  Chest pain- no      Dyspnea- no Lightheadedness- no   Edema- no Other side effects - no   Medication compliance: good Low salt diet- yes  DIABETES  Blood Sugar ranges-not checking  Polyuria- no New Visual problems- no Hypoglycemic symptoms- no Other side effects-no Medication compliance - good Last eye exam- annually Foot exam- today  HYPERLIPIDEMIA  Medication compliance- good RUQ pain- no  Muscle aches- no Other side effects-no  ROS See HPI above   PMH Smoking Status noted      Review of Systems  Constitutional: Negative for diaphoresis, appetite change, fatigue and unexpected weight change.  Eyes: Negative for pain, redness and visual disturbance.  Respiratory: Negative for cough, chest tightness, shortness of breath and wheezing.   Cardiovascular: Positive for leg swelling. Negative for chest pain and palpitations.  Gastrointestinal: Positive for nausea, vomiting and abdominal pain.  Endocrine: Negative for cold intolerance, heat intolerance, polydipsia, polyphagia and polyuria.  Genitourinary: Negative for dysuria, frequency and difficulty urinating.  Neurological: Negative for dizziness, light-headedness, numbness and headaches.    History Past Medical History  Diagnosis Date  . Hypertension   . Deaf   . Asthma   . Thyroid disease   . Diabetes mellitus without complication [facility])     She has past surgical history that includes Cesarean section; Cardiac catheterization; and Esophageal manometry (N/A, 09/29/2013).   Her family history includes Diabetes in her father and mother; Heart disease in her  mother.She reports that she has never smoked. She has never used smokeless tobacco. She reports that she does not drink alcohol or use illicit drugs.  Current Outpatient Prescriptions on File Prior to Visit  Medication Sig Dispense Refill  . albuterol (PROVENTIL HFA;VENTOLIN HFA) 108 (90 BASE) MCG/ACT inhaler Inhale 2 puffs into the lungs every 6 (six) hours as needed for wheezing. 1 Inhaler 3  . amLODipine (NORVASC) 5 MG tablet TAKE 1 TABLET (5 MG TOTAL) BY MOUTH DAILY. 30 tablet 6  . cyclobenzaprine (FLEXERIL) 5 MG tablet Take 1 tablet (5 mg total) by mouth at bedtime. 7 tablet 0  . diclofenac (VOLTAREN) 75 MG EC tablet Take 1 tablet (75 mg total) by mouth 2 (two) times daily. 60 tablet 1  . famotidine (PEPCID) 20 MG tablet Take 20 mg by mouth at bedtime.    . Fiber CHEW Chew 1 tablet by mouth daily.    . fluticasone (FLONASE) 50 MCG/ACT nasal spray PLACE 2 SPRAYS INTO BOTH NOSTRILS DAILY. 16 g 4  . meclizine (ANTIVERT) 25 MG tablet Take 1 tablet (25 mg total) by mouth 3 (three) times daily as needed. 45 tablet 3  . methocarbamol (ROBAXIN) 500 MG tablet Take 1 tablet (500 mg total) by mouth 2 (two) times daily. 20 tablet 0  . mometasone-formoterol (DULERA) 100-5 MCG/ACT AERO Inhale 2 puffs into the lungs 2 (two) times daily. 8.8 g 3  . Multiple Vitamin (MULTIVITAMIN) tablet Take 1 tablet by mouth daily.    . nitroGLYCERIN (NITRODUR - DOSED IN MG/24 HR) 0.2 mg/hr patch Apply 1/4th patch to affected shoulder, change daily 30 patch  1  . potassium chloride SA (K-DUR,KLOR-CON) 20 MEQ tablet Take 1 tablet (20 mEq total) by mouth daily. 30 tablet 6  . predniSONE (DELTASONE) 20 MG tablet 1 tab po tid day 1, 1 tab po bid day 2, then 1 tab po day 3 6 tablet 0   No current facility-administered medications on file prior to visit.     Objective:  Objective Physical Exam  Constitutional: She is oriented to person, place, and time. She appears well-developed and well-nourished.  HENT:  Head:  Normocephalic and atraumatic.  Eyes: Conjunctivae and EOM are normal.  Neck: Normal range of motion. Neck supple. No JVD present. Carotid bruit is not present. No thyromegaly present.  Cardiovascular: Normal rate, regular rhythm and normal heart sounds.   No murmur heard. Pulmonary/Chest: Effort normal and breath sounds normal. No respiratory distress. She has no wheezes. She has no rales. She exhibits no tenderness.  Musculoskeletal: Normal range of motion. She exhibits tenderness. She exhibits no edema.  Neurological: She is alert and oriented to person, place, and time. She has normal reflexes. She displays normal reflexes. No cranial nerve deficit. She exhibits normal muscle tone. Coordination normal.  Psychiatric: She has a normal mood and affect.  Nursing note and vitals reviewed.  BP 122/78 mmHg  Pulse 75  Temp(Src) 98.5 F (36.9 C) (Oral)  Wt 232 lb 3.2 oz (105.325 kg)  SpO2 98%  LMP 05/20/2012 Wt Readings from Last 3 Encounters:  02/08/16 232 lb 3.2 oz (105.325 kg)  02/03/16 234 lb (106.142 kg)  01/24/16 237 lb (107.502 kg)     Lab Results  Component Value Date   WBC 6.2 11/23/2015   HGB 13.6 11/23/2015   HCT 41.5 11/23/2015   PLT 221.0 11/23/2015   GLUCOSE 102* 02/08/2016   CHOL 151 02/08/2016   TRIG 84.0 02/08/2016   HDL 53.00 02/08/2016   LDLCALC 81 02/08/2016   ALT 25 02/08/2016   AST 23 02/08/2016   NA 140 02/08/2016   K 3.5 02/08/2016   CL 101 02/08/2016   CREATININE 0.83 02/08/2016   BUN 13 02/08/2016   CO2 32 02/08/2016   TSH 1.41 07/01/2015   INR 1.00 12/30/2012   HGBA1C 6.8* 02/08/2016   MICROALBUR 0.8 10/27/2015    Dg Fluoro Guided Needle Plc Aspiration/injection Loc  01/03/2016  CLINICAL DATA:  Left hip pain EXAM: FLUORO GUIDED NEEDLE PLACEMENT COMPARISON:  None. TECHNIQUE: The patient is hearing impaired. Sign language interpreter was utilized throughout the procedure. The overlying skin was prepped with Betadine, draped in the usual sterile  fashion, and infiltrated locally with 1% Lidocaine. A 22 gauge spinal needle was advanced under fluoroscopic observation to the lateral femoral neck. Confirmatory injection of less than 1 ml of Isovue 200 demonstrates intra-articular spread without intravascular component. 120 mg Depo-Medrol and 3 ml 0.25% bupivacaine were then instilled. The procedure was well tolerated. The patient was observed for a short period of time then discharged in good condition. FLUOROSCOPY TIME:  0 minutes 21 seconds.  0.215 Gy cm squared IMPRESSION: Technically successful left hip injection under fluoroscopy. Electronically Signed   By: Nelson Chimes M.D.   On: 01/03/2016 13:56     Assessment & Plan:  Plan I have changed Ms. Sibal's cetirizine. I am also having her maintain her meclizine, multivitamin, Fiber, famotidine, mometasone-formoterol, amLODipine, methocarbamol, nitroGLYCERIN, albuterol, potassium chloride SA, cyclobenzaprine, predniSONE, fluticasone, diclofenac, hydrochlorothiazide, traMADol, and metFORMIN.  Meds ordered this encounter  Medications  . cetirizine (ZYRTEC) 10 MG tablet  Sig: Take 1 tablet (10 mg total) by mouth daily.    Dispense:  30 tablet    Refill:  11  . hydrochlorothiazide (HYDRODIURIL) 12.5 MG tablet    Sig: Take 1 tablet (12.5 mg total) by mouth daily.    Dispense:  90 tablet    Refill:  1  . traMADol (ULTRAM) 50 MG tablet    Sig: Take 1 tablet (50 mg total) by mouth every 8 (eight) hours as needed.    Dispense:  30 tablet    Refill:  3    Not to exceed 5 additional fills before 06/14/2016.  . metFORMIN (GLUCOPHAGE-XR) 500 MG 24 hr tablet    Sig: TAKE 1 TABLET (500 MG TOTAL) BY MOUTH DAILY WITH BREAKFAST.    Dispense:  90 tablet    Refill:  1    Problem List Items Addressed This Visit      Unprioritized   HTN (hypertension) - Primary    Stable con't meds      Relevant Medications   hydrochlorothiazide (HYDRODIURIL) 12.5 MG tablet   Other Relevant Orders   POCT  urinalysis dipstick (Completed)   Comprehensive metabolic panel (Completed)   DM (diabetes mellitus) type II controlled, neurological manifestation (HCC)   Relevant Medications   metFORMIN (GLUCOPHAGE-XR) 500 MG 24 hr tablet    Other Visit Diagnoses    DM (diabetes mellitus) type II uncontrolled, periph vascular disorder (HCC)        Relevant Medications    hydrochlorothiazide (HYDRODIURIL) 12.5 MG tablet    metFORMIN (GLUCOPHAGE-XR) 500 MG 24 hr tablet    Other Relevant Orders    POCT urinalysis dipstick (Completed)    Lipid panel (Completed)    Hemoglobin A1c (Completed)    Comprehensive metabolic panel (Completed)    Multiple allergies        Relevant Medications    cetirizine (ZYRTEC) 10 MG tablet    Hip pain, chronic, left        Relevant Medications    traMADol (ULTRAM) 50 MG tablet    Hyperlipidemia        Relevant Medications    hydrochlorothiazide (HYDRODIURIL) 12.5 MG tablet    Other Relevant Orders    POCT urinalysis dipstick (Completed)    Lipid panel (Completed)    Hemoglobin A1c (Completed)    Comprehensive metabolic panel (Completed)    Need for hepatitis C screening test        Relevant Orders    Hepatitis C antibody (Completed)    Encounter for screening for HIV        Relevant Orders    HIV antibody (Completed)    Low back pain, unspecified back pain laterality, with sciatica presence unspecified        Relevant Medications    traMADol (ULTRAM) 50 MG tablet    Other Relevant Orders    DG Lumbar Spine 2-3 Views (Completed)    Thoracic spine pain        Relevant Medications    traMADol (ULTRAM) 50 MG tablet    Other Relevant Orders    DG Thoracic Spine 2 View (Completed)    Vitamin D deficiency        Relevant Orders    Vitamin D (25 hydroxy) (Completed)    Dysphagia        Relevant Orders    Ambulatory referral to Gastroenterology       Follow-up: Return in about 6 months (around 08/09/2016), or if symptoms worsen or fail to improve, for  hypertension, hyperlipidemia, diabetes II.  Ann Held, DO

## 2016-02-08 NOTE — Therapy (Signed)
[facility] [address]  [place], [place], [address] Phone: [phone]   Fax:  [phone]  Physical Therapy Evaluation  Patient Details  Name: Rhonda Morrow MRN: [id number] Date of Birth: 1961/01/05 Referring Provider: Dene Gentry, MD  Encounter Date: 02/08/2016      PT End of Session - 02/08/16 1431    Visit Number 1   Number of Visits 12   Date for PT Re-Evaluation 03/21/16   PT Start Time [id number]   PT Stop Time 1410   PT Time Calculation (min) 55 min   Activity Tolerance Patient tolerated treatment well;Patient limited by pain   Behavior During Therapy [facility] for tasks assessed/performed      Past Medical History  Diagnosis Date  . Hypertension   . Deaf   . Asthma   . Thyroid disease   . Diabetes mellitus without complication [facility])     Past Surgical History  Procedure Laterality Date  . Cesarean section    . Cardiac catheterization    . Esophageal manometry N/A 09/29/2013    Procedure: ESOPHAGEAL MANOMETRY (EM);  Surgeon: Milus Banister, MD;  Location: [facility];  Service: Endoscopy;  Laterality: N/A;    There were no vitals filed for this visit.  Visit Diagnosis:  Left hip pain  Stiffness of left hip, not elsewhere classified  Difficulty in walking, not elsewhere classified  Other abnormalities of gait and mobility      Subjective Assessment - 02/08/16 1321    Subjective Via sign language interpreter, patient reports pain started in Feb when trying to move a heavy object at work. Imaging done revealed arthritic changes but negative for hernia. Received injection on 01/03/16 with pain gradually improving.   Patient is accompained by: Interpreter   Pertinent History 01/03/16 - 120 mg Depo-Medrol and 3 ml 0.25% bupivacaine injection to L hip   Limitations Sitting;Standing;Walking   How long can you sit comfortably? a few hours   How long can you stand comfortably? 15 minutes   How long can  you walk comfortably? unpredictable   Diagnostic tests 12/17/15 x-ray hips: Mild symmetric narrowing of both hip joints. No fracture or dislocation.   Patient Stated Goals "Be able to go up stairs and walk without pain."   Currently in Pain? Yes   Pain Score 3   Least 0/10, Avg 3/10, Worst 5-6/10   Pain Location Hip   Pain Orientation Left;Anterior   Pain Descriptors / Indicators Sharp;Cramping   Pain Type Acute pain   Pain Onset More than a month ago   Pain Frequency Intermittent   Aggravating Factors  Stairs, moving fast, prolonged standing   Pain Relieving Factors Lying down with feet up, meds   Effect of Pain on Daily Activities Have to move at a slower pace, Limited walking and stairs (step-to pattern)            [facility] PT Assessment - 02/08/16 1315    Assessment   Medical Diagnosis L hip flexor strain/arthritis   Referring Provider Dene Gentry, MD   Onset Date/Surgical Date --  Feb 2017   Next MD Visit 03/06/16   Prior Therapy none   Balance Screen   Has the patient fallen in the past 6 months No   Has the patient had a decrease in activity level because of a fear of falling?  No   Is the patient reluctant to leave their home because of a fear of falling?  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, lfting and carrying ~25# (currently having to drag rather than lift)   Leisure Laundry, cooking   Observation/Other Assessments   Focus on Therapeutic Outcomes (FOTO)  Hip - 57% (43% limitation); Predicted 70% (305 limitation)   ROM / Strength    AROM / PROM / Strength Strength   Strength   Strength Assessment Site Hip   Right/Left Hip Right;Left   Right Hip Flexion 3+/5   Right Hip Extension 3/5   Right Hip ABduction 4-/5   Right Hip ADduction 4-/5   Left Hip Flexion 2+/5   Left Hip Extension 2+/5   Left Hip ABduction 3-/5   Left Hip ADduction 3-/5   Flexibility   Soft Tissue Assessment /Muscle Length yes  limited by pt guarding/inability to relax   Hamstrings tight bilaterally L > R   Palpation   Palpation comment ttp over L hip flexors and iliopsoas   Ambulation/Gait   Assistive device None   Gait Pattern Step-to pattern;Decreased step length - right;Decreased step length - left;Decreased stance time - left;Decreased hip/knee flexion - right;Decreased hip/knee flexion - left;Decreased stride length;Decreased weight shift to left;Antalgic;Lateral trunk lean to right;Lateral trunk lean to left;Poor foot clearance - left;Poor foot clearance - right   Stair Management Technique Step to pattern        Today's Treatment  TherEx Hamstring stretch with strap 2x20" Hooklying Hip ADD ball squeeze 10x3" Bridge 10x3" Hooklying Hip ABD/ER clam with green TB 10x3"          PT Education - 02/08/16 1709    Education provided Yes   Education Details PT eval findings, POC & initial HEP   Person(s) Educated Patient   Methods Explanation;Demonstration;Handout   Comprehension Verbalized understanding;Returned demonstration;Need further instruction             PT Long Term Goals - 02/08/16 1743    PT LONG TERM GOAL #1   Title Pt will be independent with HEP by 03/21/16   Status New   PT LONG TERM GOAL #2   Title Pt will demonstrate bilateral hip strength 4/5 or greater without increased pain by 03/21/16   Status New   PT LONG TERM GOAL #3   Title Pt will report ability to stand long enough to cook dinner or wash dishes without increased L hip pain by 03/21/16   Status New   PT LONG TERM GOAL #4   Title Pt will  ascend/descend stairs reciprocally with normal step pattern without increased L hip pain by 03/21/16   Status New               Plan - 02/08/16 1710    Clinical Impression Statement Evaluation completed with assistance of sign language interpreter due to pt with hearing impairment. Pt is a 55 y/o female who presents to OP with L anterior hip pain since February after attempting to lift/move an object at work that was too heavy. Pt reports pain improving since injection in hip just over a month ago but  still experinces pain up to 5-6/10 when she stands for prolonged periods, moves too fast or tries to climb stairs. Pain relieved by lying down and elevating leg. Assessment of flexibility limited by pt guarding and inability to relax or allow PT to passively move LE's. Proximal LE strength limited bilaterally L > R with L hip 2+/5 for flexion & extension and 3-/5 for abd/add as compared to R hip flexion 3+/5, extension 3/5 and abd/add 4-/5 with pain reported with all resistance for MMT. Pain and weakness limit standing tolerance, alters gait with pronounced antalgic gait pattern, prevent pt from negotiating stairs reciprocally and causes pt to alter how she moves objects at work (dragging rather than lifting and carrying objects).   Pt will benefit from skilled therapeutic intervention in order to improve on the following deficits Pain;Impaired flexibility;Decreased range of motion;Decreased strength;Increased muscle spasms;Difficulty walking;Abnormal gait   Rehab Potential Good   Clinical Impairments Affecting Rehab Potential Pt guarding movement; Obesity; Language barrier   PT Frequency 2x / week   PT Duration 6 weeks   PT Treatment/Interventions Patient/family education;Therapeutic exercise;Manual techniques;Passive range of motion;Taping;Dry needling;Therapeutic activities;Gait training;Stair training;Neuromuscular re-education;Ultrasound;Moist Heat;Electrical Stimulation;Cryotherapy;Iontophoresis  4mg /ml Dexamethasone   PT Next Visit Plan Review HEP; Address proximal flexibilty as pt able; Gentle strengthening progression; Gait training to normalize gait; Manual therapy and Modalities PRN for tightness and pain   Consulted and Agree with Plan of Care Patient         Problem List Patient Active Problem List   Diagnosis Date Noted  . Left hip pain 12/23/2015  . Right hamstring muscle strain 11/18/2015  . Abdominal pain, acute 09/01/2015  . Acute asthma exacerbation 04/16/2015  . Acute bronchitis 04/14/2015  . Vaginal discharge 09/14/2014  . Pap smear for cervical cancer screening 09/14/2014  . Scabies 05/06/2014  . Bed bug bite 05/06/2014  . Allergic rhinitis 04/09/2014  . Rectal itching 04/09/2014  . Bladder spasm 04/09/2014  . Knee pain, left 03/05/2014  . Hypokalemia 02/19/2014  . Nausea with vomiting 02/19/2014  . Diabetes mellitus, type II ([place]) 02/19/2014  . Edema 02/16/2014  . Disorder of rotator cuff 11/12/2013  . Routine general medical examination at a health care facility 08/20/2013  . GERD (gastroesophageal reflux disease) 06/26/2013  . Dysphagia, pharyngoesophageal phase 06/26/2013  . Suprapubic pain 05/12/2013  . Medication side effect 03/25/2013  . Diarrhea 03/25/2013  . Benign positional vertigo 01/30/2013  . Traumatic hematoma of thigh 01/15/2013  . HTN (hypertension) 09/02/2012  . Dry skin 08/22/2012  . External hemorrhoids 08/22/2012  . Obesity 06/30/2012  . Thyromegaly 05/22/2012  . Back pain 05/22/2012  . Elevated glucose 05/22/2012  . Moderate persistent asthma 04/19/2012  . Dyspnea 01/28/2012    Percival Spanish, PT, MPT 02/08/2016, 5:57 PM  [facility] [address]  [place], [place], [address] Phone: [phone]   Fax:  [phone]  Name: Rhonda Morrow MRN: [id number] Date of Birth: 01-19-1961

## 2016-02-09 DIAGNOSIS — E1149 Type 2 diabetes mellitus with other diabetic neurological complication: Secondary | ICD-10-CM | POA: Insufficient documentation

## 2016-02-09 LAB — HEMOGLOBIN A1C: Hgb A1c MFr Bld: 6.8 % — ABNORMAL HIGH (ref 4.6–6.5)

## 2016-02-09 LAB — VITAMIN D 25 HYDROXY (VIT D DEFICIENCY, FRACTURES): VITD: 52.5 ng/mL (ref 30.00–100.00)

## 2016-02-09 LAB — COMPREHENSIVE METABOLIC PANEL
ALBUMIN: 4 g/dL (ref 3.5–5.2)
ALK PHOS: 62 U/L (ref 39–117)
ALT: 25 U/L (ref 0–35)
AST: 23 U/L (ref 0–37)
BUN: 13 mg/dL (ref 6–23)
CO2: 32 mEq/L (ref 19–32)
Calcium: 9.9 mg/dL (ref 8.4–10.5)
Chloride: 101 mEq/L (ref 96–112)
Creatinine, Ser: 0.83 mg/dL (ref 0.40–1.20)
GFR: 92.69 mL/min (ref >60.00)
Glucose, Bld: 102 mg/dL — ABNORMAL HIGH (ref 70–99)
POTASSIUM: 3.5 meq/L (ref 3.5–5.1)
Sodium: 140 mEq/L (ref 135–145)
TOTAL PROTEIN: 7.5 g/dL (ref 6.0–8.3)
Total Bilirubin: 0.5 mg/dL (ref 0.2–1.2)

## 2016-02-09 LAB — LIPID PANEL
CHOLESTEROL: 151 mg/dL (ref 0–200)
HDL: 53 mg/dL (ref >39.00)
LDL CALC: 81 mg/dL (ref 0–99)
NonHDL: 98.19
TRIGLYCERIDES: 84 mg/dL (ref 0.0–149.0)
Total CHOL/HDL Ratio: 3
VLDL: 16.8 mg/dL (ref 0.0–40.0)

## 2016-02-09 LAB — HEPATITIS C ANTIBODY: HCV AB: NEGATIVE

## 2016-02-09 LAB — HIV ANTIBODY (ROUTINE TESTING W REFLEX): HIV 1&2 Ab, 4th Generation: NONREACTIVE

## 2016-02-09 NOTE — Assessment & Plan Note (Signed)
Stable con't meds 

## 2016-02-15 ENCOUNTER — Telehealth: Payer: Self-pay | Admitting: Family Medicine

## 2016-02-15 ENCOUNTER — Ambulatory Visit: Payer: Federal, State, Local not specified - PPO

## 2016-02-15 DIAGNOSIS — R262 Difficulty in walking, not elsewhere classified: Secondary | ICD-10-CM

## 2016-02-15 DIAGNOSIS — M25552 Pain in left hip: Secondary | ICD-10-CM

## 2016-02-15 DIAGNOSIS — R2689 Other abnormalities of gait and mobility: Secondary | ICD-10-CM

## 2016-02-15 DIAGNOSIS — M25652 Stiffness of left hip, not elsewhere classified: Secondary | ICD-10-CM | POA: Diagnosis not present

## 2016-02-15 NOTE — Telephone Encounter (Signed)
Relation to [id number] Call back [place]: [id number] - [place], [place] [phone] (Phone) [phone] (Fax)         Reason for call:  Patient requesting a refill of diabetic supplies patient requesting the lancets and the monitor. Patient in addition states she didn't understand her lab results, in need of clarification, patient states please leave detail message regarding labs.

## 2016-02-15 NOTE — Therapy (Signed)
[facility] [address]  [place], [place], [address] Phone: [phone]   Fax:  [phone]  Physical Therapy Treatment  Patient Details  Name: Rhonda Morrow MRN: [id number] Date of Birth: 01/30/1961 Referring Provider: Dene Gentry, MD  Encounter Date: 02/15/2016      PT End of Session - 02/15/16 1716    Visit Number 2   Number of Visits 12   Date for PT Re-Evaluation 03/21/16   PT Start Time 1620   PT Stop Time 1705   PT Time Calculation (min) 45 min   Activity Tolerance Patient tolerated treatment well;Patient limited by pain   Behavior During Therapy [facility] for tasks assessed/performed      Past Medical History  Diagnosis Date  . Hypertension   . Deaf   . Asthma   . Thyroid disease   . Diabetes mellitus without complication [facility])     Past Surgical History  Procedure Laterality Date  . Cesarean section    . Cardiac catheterization    . Esophageal manometry N/A 09/29/2013    Procedure: ESOPHAGEAL MANOMETRY (EM);  Surgeon: Milus Banister, MD;  Location: [facility];  Service: Endoscopy;  Laterality: N/A;    There were no vitals filed for this visit.      Subjective Assessment - 02/15/16 1711    Subjective Via sign language interpreter, pt. states she has pain with some movements however is unable to assign a number to the pain.     Currently in Pain? No/denies   Pain Score 0-No pain   Pain Location Hip   Pain Orientation Left;Anterior   Pain Descriptors / Indicators Sharp;Cramping   Pain Type Acute pain   Pain Onset More than a month ago   Pain Frequency Intermittent   Multiple Pain Sites No     Today/s Treatment:  Manual: HS, RF (thomas position), piri, SKTC x 30 sec; pt. Limited ROM with most stretches secondary to L hip pain Strumming to RF in thomas position x 5 min   Therex: Hooklying bridging x 10 reps  Hooklying clam shell with blue TB around hips x 10 reps   HEP  review: HS stretch with strap x 30 sec  RF stretch in mod thomas x 30 sec          PT Education - 02/15/16 1710    Education provided Yes   Education Details HS, thomas stretch for RF, bridge, clam shell with green TB    Person(s) Educated Patient   Methods Demonstration;Explanation;Tactile cues;Handout   Comprehension Returned demonstration;Verbal cues required;Tactile cues required;Need further instruction             PT Long Term Goals - 02/15/16 1723    PT LONG TERM GOAL #1   Title Pt will be independent with HEP by 03/21/16   Status On-going   PT LONG TERM GOAL #2   Title Pt will demonstrate bilateral hip strength 4/5 or greater without increased pain by 03/21/16   Status On-going   PT LONG TERM GOAL #3   Title Pt will report ability to stand long enough to cook dinner or wash dishes without increased L hip pain by 03/21/16   Status On-going   PT LONG TERM GOAL #4   Title Pt will ascend/descend stairs reciprocally with normal step pattern without increased L hip pain by 03/21/16   Status On-going               Plan -  02/15/16 1718    Clinical Impression Statement Pt. reports she has pain with some movements however unable to assign a number to the L hip pain.  Pt. limited with passive stretching secondary to L hip pain.  pt. reports she was unable to do the HEP program since the evaluation; pt. instructed on importance of HEP; pt. given a copy handout of original HEP secondary to request.  Strengthening activity in supine today.     PT Treatment/Interventions Patient/family education;Therapeutic exercise;Manual techniques;Passive range of motion;Taping;Dry needling;Therapeutic activities;Gait training;Stair training;Neuromuscular re-education;Ultrasound;Moist Heat;Electrical Stimulation;Cryotherapy;Iontophoresis 4mg /ml Dexamethasone   PT Next Visit Plan Review HEP; Address proximal flexibilty as pt able; Gentle strengthening progression; Gait training to normalize  gait; Manual therapy and Modalities PRN for tightness and pain      Patient will benefit from skilled therapeutic intervention in order to improve the following deficits and impairments:  Pain, Impaired flexibility, Decreased range of motion, Decreased strength, Increased muscle spasms, Difficulty walking, Abnormal gait  Visit Diagnosis: Pain in left hip  Stiffness of left hip, not elsewhere classified  Difficulty in walking, not elsewhere classified  Other abnormalities of gait and mobility     Problem List Patient Active Problem List   Diagnosis Date Noted  . DM (diabetes mellitus) type II controlled, neurological manifestation ([place]) 02/09/2016  . Left hip pain 12/23/2015  . Right hamstring muscle strain 11/18/2015  . Abdominal pain, acute 09/01/2015  . Acute asthma exacerbation 04/16/2015  . Acute bronchitis 04/14/2015  . Vaginal discharge 09/14/2014  . Pap smear for cervical cancer screening 09/14/2014  . Scabies 05/06/2014  . Bed bug bite 05/06/2014  . Allergic rhinitis 04/09/2014  . Rectal itching 04/09/2014  . Bladder spasm 04/09/2014  . Knee pain, left 03/05/2014  . Hypokalemia 02/19/2014  . Nausea with vomiting 02/19/2014  . Diabetes mellitus, type II ([place]) 02/19/2014  . Edema 02/16/2014  . Disorder of rotator cuff 11/12/2013  . Routine general medical examination at a health care facility 08/20/2013  . GERD (gastroesophageal reflux disease) 06/26/2013  . Dysphagia, pharyngoesophageal phase 06/26/2013  . Suprapubic pain 05/12/2013  . Medication side effect 03/25/2013  . Diarrhea 03/25/2013  . Benign positional vertigo 01/30/2013  . Traumatic hematoma of thigh 01/15/2013  . HTN (hypertension) 09/02/2012  . Dry skin 08/22/2012  . External hemorrhoids 08/22/2012  . Obesity 06/30/2012  . Thyromegaly 05/22/2012  . Back pain 05/22/2012  . Elevated glucose 05/22/2012  . Moderate persistent asthma 04/19/2012  . Dyspnea 01/28/2012    Bess Harvest,  PTA 02/15/2016, 5:35 PM  [facility] [address]  [place], [place], [address] Phone: [phone]   Fax:  [phone]  Name: Rhonda Morrow MRN: [id number] Date of Birth: 09/21/1961

## 2016-02-16 NOTE — Telephone Encounter (Signed)
Message left to call the office.    KP 

## 2016-02-16 NOTE — Telephone Encounter (Signed)
Notes Recorded by Ann Held, DO on 02/09/2016 at 8:41 PM Mild arthritic changes Lumbar spine Can refer to sport med if pain cont  Notes Recorded by Ann Held, DO on 02/09/2016 at 8:40 PM Mild arthritic changes Thoracic spine Slight scoliosis

## 2016-02-17 ENCOUNTER — Telehealth: Payer: Self-pay | Admitting: Family Medicine

## 2016-02-17 NOTE — Telephone Encounter (Signed)
error:[id number] ° °

## 2016-02-17 NOTE — Telephone Encounter (Signed)
Results given to pt's spouse and he will let us know if she wants to proceed with sports med referral.

## 2016-02-17 NOTE — Telephone Encounter (Signed)
Left message for pt to return my call.

## 2016-02-21 ENCOUNTER — Emergency Department (HOSPITAL_COMMUNITY)
Admission: EM | Admit: 2016-02-21 | Discharge: 2016-02-21 | Disposition: A | Discharge status: Home / Self Care | Payer: Federal, State, Local not specified - PPO | Attending: Emergency Medicine | Admitting: Emergency Medicine

## 2016-02-21 ENCOUNTER — Emergency Department (HOSPITAL_COMMUNITY): Payer: Federal, State, Local not specified - PPO

## 2016-02-21 ENCOUNTER — Encounter (HOSPITAL_COMMUNITY): Payer: Self-pay | Admitting: Emergency Medicine

## 2016-02-21 ENCOUNTER — Telehealth: Payer: Self-pay | Admitting: Family Medicine

## 2016-02-21 DIAGNOSIS — H578 Other specified disorders of eye and adnexa: Secondary | ICD-10-CM | POA: Diagnosis not present

## 2016-02-21 DIAGNOSIS — Z7984 Long term (current) use of oral hypoglycemic drugs: Secondary | ICD-10-CM | POA: Diagnosis not present

## 2016-02-21 DIAGNOSIS — Z7951 Long term (current) use of inhaled steroids: Secondary | ICD-10-CM | POA: Diagnosis not present

## 2016-02-21 DIAGNOSIS — Z791 Long term (current) use of non-steroidal anti-inflammatories (NSAID): Secondary | ICD-10-CM | POA: Diagnosis not present

## 2016-02-21 DIAGNOSIS — R067 Sneezing: Secondary | ICD-10-CM | POA: Diagnosis not present

## 2016-02-21 DIAGNOSIS — H9190 Unspecified hearing loss, unspecified ear: Secondary | ICD-10-CM | POA: Diagnosis not present

## 2016-02-21 DIAGNOSIS — R0602 Shortness of breath: Secondary | ICD-10-CM | POA: Diagnosis not present

## 2016-02-21 DIAGNOSIS — R05 Cough: Secondary | ICD-10-CM | POA: Diagnosis not present

## 2016-02-21 DIAGNOSIS — J45909 Unspecified asthma, uncomplicated: Secondary | ICD-10-CM | POA: Insufficient documentation

## 2016-02-21 DIAGNOSIS — E119 Type 2 diabetes mellitus without complications: Secondary | ICD-10-CM | POA: Insufficient documentation

## 2016-02-21 DIAGNOSIS — Z79899 Other long term (current) drug therapy: Secondary | ICD-10-CM | POA: Insufficient documentation

## 2016-02-21 DIAGNOSIS — R059 Cough, unspecified: Secondary | ICD-10-CM

## 2016-02-21 DIAGNOSIS — J3489 Other specified disorders of nose and nasal sinuses: Secondary | ICD-10-CM | POA: Insufficient documentation

## 2016-02-21 DIAGNOSIS — J029 Acute pharyngitis, unspecified: Secondary | ICD-10-CM | POA: Insufficient documentation

## 2016-02-21 DIAGNOSIS — I10 Essential (primary) hypertension: Secondary | ICD-10-CM | POA: Diagnosis not present

## 2016-02-21 MED ORDER — BENZONATATE 100 MG PO CAPS: 100.0000 mg | ORAL_CAPSULE | Freq: Three times a day (TID) | ORAL | Status: DC

## 2016-02-21 MED ORDER — IPRATROPIUM-ALBUTEROL 0.5-2.5 (3) MG/3ML IN SOLN
3.0000 mL | Freq: Once | RESPIRATORY_TRACT | Status: AC
  Administered 2016-02-21: 3 mL via RESPIRATORY_TRACT
  Filled 2016-02-21: qty 3

## 2016-02-21 NOTE — Telephone Encounter (Signed)
Per chart, the patient is being seen at the ED.

## 2016-02-21 NOTE — Telephone Encounter (Signed)
PLEASE NOTE: All timestamps contained within this report are represented as [place] Standard Time. CONFIDENTIALTY NOTICE: This fax transmission is intended only for the addressee. It contains information that is legally privileged, confidential or otherwise protected from use or disclosure. If you are not the intended recipient, you are strictly prohibited from reviewing, disclosing, copying using or disseminating any of this information or taking any action in reliance on or regarding this information. If you have received this fax in error, please notify us immediately by telephone so that we can arrange for its return to [place]. Phone: [phone], Toll-Free: [phone], Fax: [phone] Page: 1 of 1 Call Id: [id number] [facility] [place] Patient Name: Rhonda Morrow DOB: September 09, 1961 Initial Comment Caller states she is flank pain-- caller can not breathe. ** Deaf** Nurse Assessment Nurse: Marcelline Deist, RN, Lynda Date/Time (Eastern Time): 02/21/2016 10:34:55 AM Confirm and document reason for call. If symptomatic, describe symptoms. You must click the next button to save text entered. ---Caller states she is having flank pain on the right. Caller can not breathe. Has pain all over, couldn't sleep. ** Deaf** Symptoms started early this am. Has the patient traveled out of the country within the last 30 days? ---Not Applicable Does the patient have any new or worsening symptoms? ---Yes Will a triage be completed? ---Yes Related visit to physician within the last 2 weeks? ---No Does the PT have any chronic conditions? (i.e. diabetes, asthma, etc.) ---Yes List chronic conditions. ---uses an inhaler, allergies, asthma Is the patient pregnant or possibly pregnant? (Ask all females between the ages of 51-55) ---No Is this a behavioral health or substance abuse call? ---No Guidelines Guideline Title Affirmed Question  Affirmed Notes Asthma Attack Severe difficulty breathing (e.g., struggling for each breath, speak in single words, pulse > 120) Final Disposition User Call EMS 911 Now Marcelline Deist, RN, [employer] used deaf interpreter for call. Disagree/Comply: Comply

## 2016-02-21 NOTE — Telephone Encounter (Signed)
Pt called in stating she is having kidney pain and shortness of breath. Transferred to [facility] with [facility]. (Pt using interpretor service).

## 2016-02-21 NOTE — Discharge Instructions (Signed)

## 2016-02-21 NOTE — ED Notes (Signed)
Bed: [id number] Expected date:  Expected time:  Means of arrival:  Comments: EMS- asthma

## 2016-02-21 NOTE — ED Notes (Addendum)
Patient reports cough, chest tightness with cough, "hard time breathing", runny nose, sneezing, dry cough and sore throat x1 day. Denies N/V, no edema.  Sign language interpretor: Helene Kelp [phone]

## 2016-02-21 NOTE — Telephone Encounter (Signed)
Patient Name: Rhonda Morrow  DOB: 04/07/1961    Initial Comment Caller states she is wondering what she should do next   Nurse Assessment  Nurse: Leilani Merl, RN, Heather Date/Time (Eastern Time): 02/21/2016 11:02:45 AM  Confirm and document reason for call. If symptomatic, describe symptoms. You must click the next button to save text entered. ---Caller is calling back wondering what she needs to do. She was not sure if she needed to call the nurse back or call 911.  Has the patient traveled out of the country within the last 30 days? ---Not Applicable  Does the patient have any new or worsening symptoms? ---No  Please document clinical information provided and list any resource used. ---Caller informed that the nurse informed her to call 911 to go to the ED at the hospital. Caller verbalizes understanding and states that she is not going to call 911, but she will go to the hospital     Guidelines    Guideline Title Affirmed Question Affirmed Notes       Final Disposition User   Clinical Call [employer], RN, [employer]

## 2016-02-21 NOTE — ED Notes (Signed)
Patient presents from home via EMS cough, congestion. History of asthma, no relief with inhaler.   Last VS: 138/96, 80hr, 99% ra.

## 2016-02-21 NOTE — ED Provider Notes (Signed)
CSN: [id number]     Arrival date & time 02/21/16  1147 History  By signing my name below, I, Rhonda Morrow, attest that this documentation has been prepared under the direction and in the presence of [employer], PA-C. Electronically Signed: Rayna Morrow, ED Scribe. 02/21/2016. 12:19 PM.   No chief complaint on file.  The history is provided by the patient. A language interpreter was used (sign language interpretor ).    HPI Comments: Rhonda Morrow is a 55 y.o. female with a PMHx of deafness, asthma and seasonal allergies who presents to the Emergency Department by ambulance complaining of a constant, mild, dry cough x 1 day. Pt was speaking to her PCP to make an appointment this morning and based on her symptoms was instructed to come to the ED for evaluation. She states that she began experiencing associated rhinorrhea, eye watering, sore throat, mild chest tightness when coughing, mild SOB, sinus congestion and sneezing. She reports using her rx inhaler and taking cetirizine without significant relief of her symptoms. Pt denies abd pain, n/v, leg swelling or other associated symptoms at this time.   Past Medical History  Diagnosis Date  . Hypertension   . Deaf   . Asthma   . Thyroid disease   . Diabetes mellitus without complication [facility])    Past Surgical History  Procedure Laterality Date  . Cesarean section    . Cardiac catheterization    . Esophageal manometry N/A 09/29/2013    Procedure: ESOPHAGEAL MANOMETRY (EM);  Surgeon: Milus Banister, MD;  [facility];  Service: Endoscopy;  Laterality: N/A;   Family History  Problem Relation Age of Onset  . Diabetes Mother   . Diabetes Father   . Heart disease Mother    Social History  Substance Use Topics  . Smoking status: Never Smoker   . Smokeless tobacco: Never Used  . Alcohol Use: No   OB History    No data available     Review of Systems A complete 10 system review of systems was obtained and all systems  are negative except as noted in the HPI and PMH.    Allergies  Losartan; Augmentin; and Oxycodone-acetaminophen  Home Medications   Prior to Admission medications   Medication Sig Start Date End Date Taking? Authorizing Provider  albuterol (PROVENTIL HFA;VENTOLIN HFA) 108 (90 BASE) MCG/ACT inhaler Inhale 2 puffs into the lungs every 6 (six) hours as needed for wheezing. 10/27/15   Midge Minium, MD  amLODipine (NORVASC) 5 MG tablet TAKE 1 TABLET (5 MG TOTAL) BY MOUTH DAILY. 08/23/15   Midge Minium, MD  benzonatate (TESSALON) 100 MG capsule Take 1 capsule (100 mg total) by mouth every 8 (eight) hours. 02/21/16   Okey Regal, PA-C  cetirizine (ZYRTEC) 10 MG tablet Take 1 tablet (10 mg total) by mouth daily. 02/08/16   Rosalita Chessman Chase, DO  cyclobenzaprine (FLEXERIL) 5 MG tablet Take 1 tablet (5 mg total) by mouth at bedtime. 11/16/15   Percell Miller Saguier, PA-C  diclofenac (VOLTAREN) 75 MG EC tablet Take 1 tablet (75 mg total) by mouth 2 (two) times daily. 01/24/16   Percell Miller Saguier, PA-C  famotidine (PEPCID) 20 MG tablet Take 20 mg by mouth at bedtime.    Historical Provider, MD  Fiber CHEW Chew 1 tablet by mouth daily.    Historical Provider, MD  fluticasone (FLONASE) 50 MCG/ACT nasal spray PLACE 2 SPRAYS INTO BOTH NOSTRILS DAILY. 12/14/15   Midge Minium, MD  hydrochlorothiazide (HYDRODIURIL)  12.5 MG tablet Take 1 tablet (12.5 mg total) by mouth daily. 02/08/16   Rosalita Chessman Chase, DO  meclizine (ANTIVERT) 25 MG tablet Take 1 tablet (25 mg total) by mouth 3 (three) times daily as needed. 01/30/13   Midge Minium, MD  metFORMIN (GLUCOPHAGE-XR) 500 MG 24 hr tablet TAKE 1 TABLET (500 MG TOTAL) BY MOUTH DAILY WITH BREAKFAST. 02/08/16   Alferd Apa Lowne Chase, DO  methocarbamol (ROBAXIN) 500 MG tablet Take 1 tablet (500 mg total) by mouth 2 (two) times daily. 08/27/15   Antonietta Breach, PA-C  mometasone-formoterol (DULERA) 100-5 MCG/ACT AERO Inhale 2 puffs into the lungs 2 (two) times daily.  04/16/15   Brunetta Jeans, PA-C  Multiple Vitamin (MULTIVITAMIN) tablet Take 1 tablet by mouth daily.    Historical Provider, MD  nitroGLYCERIN (NITRODUR - DOSED IN MG/24 HR) 0.2 mg/hr patch Apply 1/4th patch to affected shoulder, change daily 10/25/15   Dene Gentry, MD  potassium chloride SA (K-DUR,KLOR-CON) 20 MEQ tablet Take 1 tablet (20 mEq total) by mouth daily. 10/27/15   Midge Minium, MD  predniSONE (DELTASONE) 20 MG tablet 1 tab po tid day 1, 1 tab po bid day 2, then 1 tab po day 3 11/23/15   Mackie Pai, PA-C  traMADol (ULTRAM) 50 MG tablet Take 1 tablet (50 mg total) by mouth every 8 (eight) hours as needed. 02/08/16   Alferd Apa Lowne Chase, DO   BP 123/67 mmHg  Pulse 74  Temp(Src) 98.3 F (36.8 C) (Axillary)  Resp 20  SpO2 99%  LMP 05/20/2012    Physical Exam  Constitutional: She is oriented to person, place, and time. She appears well-developed and well-nourished.  HENT:  Head: Normocephalic and atraumatic.  Eyes: EOM are normal.  Neck: Normal range of motion.  Cardiovascular: Normal rate.   Pulmonary/Chest: Effort normal. No respiratory distress.  Abdominal: Soft.  Musculoskeletal: Normal range of motion.  Neurological: She is alert and oriented to person, place, and time.  Skin: Skin is warm and dry.  Psychiatric: She has a normal mood and affect.  Nursing note and vitals reviewed.   ED Course  Procedures  DIAGNOSTIC STUDIES: Oxygen Saturation is 98% on RA, normal by my interpretation.    COORDINATION OF CARE: 12:17 PM Discussed next steps with pt. She verbalized understanding and is agreeable with the plan.   Labs Review Labs Reviewed - No data to display  Imaging Review Dg Chest 2 View  02/21/2016  CLINICAL DATA:  Dry cough for 1 day EXAM: CHEST  2 VIEW COMPARISON:  09/01/2015 FINDINGS: There is no focal parenchymal opacity. There is no pleural effusion or pneumothorax. There is mild stable cardiomegaly. The osseous structures are unremarkable.  IMPRESSION: No active cardiopulmonary disease. Electronically Signed   By: Kathreen Devoid   On: 02/21/2016 14:00     EKG Interpretation None      MDM   Final diagnoses:  Cough    Labs: none indicated  Imaging: none indicated  Consults: none  Therapeutics: Duoneb nebulizer solution  Assessment: 55 year old female presents today with complaints of shortness of breath, allergic type symptoms and cough. Patient's chest x-ray here is normal, she appears to have improved up or respiratory complaints, but continues to have cough. He reports that this is likely due to allergies, she has no infectious etiologies today. Patient will be given cough medication, encouraged to use antihistamines, and follow-up with her primary care provider for reevaluation further management.  Plan: Pt given strict  return precautions, verbalized understanding and agreement to today's plan and had no further questions or concerns at the time of discharge.   I personally performed the services described in this documentation, which was scribed in my presence. The recorded information has been reviewed and is accurate.   Okey Regal, PA-C 02/21/16 [place], MD 02/21/16 [id number]

## 2016-02-22 ENCOUNTER — Ambulatory Visit: Payer: Federal, State, Local not specified - PPO

## 2016-02-22 DIAGNOSIS — M25652 Stiffness of left hip, not elsewhere classified: Secondary | ICD-10-CM

## 2016-02-22 DIAGNOSIS — R262 Difficulty in walking, not elsewhere classified: Secondary | ICD-10-CM | POA: Diagnosis not present

## 2016-02-22 DIAGNOSIS — R2689 Other abnormalities of gait and mobility: Secondary | ICD-10-CM

## 2016-02-22 DIAGNOSIS — M25552 Pain in left hip: Secondary | ICD-10-CM

## 2016-02-22 NOTE — Therapy (Signed)
[facility] [address]  [place], [place], [address] Phone: [phone]   Fax:  [phone]  Physical Therapy Treatment  Patient Details  Name: Kniya Riddley MRN: [id number] Date of Birth: 1961-04-17 Referring Provider: Dene Gentry, MD  Encounter Date: 02/22/2016      PT End of Session - 02/22/16 1154    Visit Number 3   Number of Visits 12   Date for PT Re-Evaluation 03/21/16   PT Start Time [id number]   PT Stop Time 1703   PT Time Calculation (min) 42 min   Activity Tolerance Patient tolerated treatment well   Behavior During Therapy [facility] for tasks assessed/performed      Past Medical History  Diagnosis Date  . Hypertension   . Deaf   . Asthma   . Thyroid disease   . Diabetes mellitus without complication [facility])     Past Surgical History  Procedure Laterality Date  . Cesarean section    . Cardiac catheterization    . Esophageal manometry N/A 09/29/2013    Procedure: ESOPHAGEAL MANOMETRY (EM);  Surgeon: Milus Banister, MD;  [facility];  Service: Endoscopy;  Laterality: N/A;    There were no vitals filed for this visit.      Subjective Assessment - 02/22/16 1630    Subjective via sign language interpreter, pt. states she has L anterior hip pain 3/10 today with and without movement.  Pt. reports she went to the ER yesterday for breathing problems, however these issues are resolved now.     Patient Stated Goals "Be able to go up stairs and walk without pain."   Currently in Pain? Yes   Pain Score 3    Pain Location Hip   Pain Orientation Left;Anterior   Pain Descriptors / Indicators Sharp;Cramping   Pain Type Acute pain   Pain Onset More than a month ago   Pain Frequency Intermittent      Today/s Treatment: Therex: NuStep: level 3, 4 min   Manual: HS, RF (thomas position), piri, SKTC x 30 sec; pt. limited ROM with most stretches secondary to L hip pain Strumming to RF in thomas  position x 1 min   Therex: Hooklying bridging x 10 reps  Hooklying clam shell with blue TB around hips x 10 reps   B sidelying clam shell with blue TB x 10 reps Straight leg bridge with heels on peanut p-ball  HS curl with heels on peanut p-ball x 10 reps Standing hip extension, abduction x 10 reps each  B fitter knee ext. Leg press x 15 reps (1 blue / 1 black)        PT Long Term Goals - 02/15/16 1723    PT LONG TERM GOAL #1   Title Pt will be independent with HEP by 03/21/16   Status On-going   PT LONG TERM GOAL #2   Title Pt will demonstrate bilateral hip strength 4/5 or greater without increased pain by 03/21/16   Status On-going   PT LONG TERM GOAL #3   Title Pt will report ability to stand long enough to cook dinner or wash dishes without increased L hip pain by 03/21/16   Status On-going   PT LONG TERM GOAL #4   Title Pt will ascend/descend stairs reciprocally with normal step pattern without increased L hip pain by 03/21/16   Status On-going               Plan -  02/23/16 1155    Clinical Impression Statement Pt. able to perform more therex without L hip pain limitation today.  Today's therex focused on B hip / knee stretching and strengthening activity with HEP review; pt. reports her mother is sick and she is unable to perform HEP often.  Pt. reports being in the ER for breathing problems yesterday, however that these issues were resolved and she is fine today.     PT Treatment/Interventions Patient/family education;Therapeutic exercise;Manual techniques;Passive range of motion;Taping;Dry needling;Therapeutic activities;Gait training;Stair training;Neuromuscular re-education;Ultrasound;Moist Heat;Electrical Stimulation;Cryotherapy;Iontophoresis 4mg /ml Dexamethasone   PT Next Visit Plan Review HEP; Address proximal flexibilty as pt able; Gentle strengthening progression; Gait training to normalize gait; Manual therapy and Modalities PRN for tightness and pain       Patient will benefit from skilled therapeutic intervention in order to improve the following deficits and impairments:  Pain, Impaired flexibility, Decreased range of motion, Decreased strength, Increased muscle spasms, Difficulty walking, Abnormal gait  Visit Diagnosis: Pain in left hip  Stiffness of left hip, not elsewhere classified  Difficulty in walking, not elsewhere classified  Other abnormalities of gait and mobility     Problem List Patient Active Problem List   Diagnosis Date Noted  . DM (diabetes mellitus) type II controlled, neurological manifestation ([place]) 02/09/2016  . Left hip pain 12/23/2015  . Right hamstring muscle strain 11/18/2015  . Abdominal pain, acute 09/01/2015  . Acute asthma exacerbation 04/16/2015  . Acute bronchitis 04/14/2015  . Vaginal discharge 09/14/2014  . Pap smear for cervical cancer screening 09/14/2014  . Scabies 05/06/2014  . Bed bug bite 05/06/2014  . Allergic rhinitis 04/09/2014  . Rectal itching 04/09/2014  . Bladder spasm 04/09/2014  . Knee pain, left 03/05/2014  . Hypokalemia 02/19/2014  . Nausea with vomiting 02/19/2014  . Diabetes mellitus, type II ([place]) 02/19/2014  . Edema 02/16/2014  . Disorder of rotator cuff 11/12/2013  . Routine general medical examination at a health care facility 08/20/2013  . GERD (gastroesophageal reflux disease) 06/26/2013  . Dysphagia, pharyngoesophageal phase 06/26/2013  . Suprapubic pain 05/12/2013  . Medication side effect 03/25/2013  . Diarrhea 03/25/2013  . Benign positional vertigo 01/30/2013  . Traumatic hematoma of thigh 01/15/2013  . HTN (hypertension) 09/02/2012  . Dry skin 08/22/2012  . External hemorrhoids 08/22/2012  . Obesity 06/30/2012  . Thyromegaly 05/22/2012  . Back pain 05/22/2012  . Elevated glucose 05/22/2012  . Moderate persistent asthma 04/19/2012  . Dyspnea 01/28/2012    Bess Harvest, PTA 02/23/2016, 12:06 PM  [facility] [address]  [place], [place], [address] Phone: [phone]   Fax:  [phone]  Name: Phebe Ehlinger MRN: [id number] Date of Birth: 1961-04-04

## 2016-02-23 ENCOUNTER — Other Ambulatory Visit: Payer: Self-pay

## 2016-02-23 DIAGNOSIS — I10 Essential (primary) hypertension: Secondary | ICD-10-CM

## 2016-02-23 MED ORDER — HYDROCHLOROTHIAZIDE 12.5 MG PO TABS: 12.5000 mg | ORAL_TABLET | Freq: Every day | ORAL | Status: DC

## 2016-02-24 ENCOUNTER — Ambulatory Visit: Payer: Federal, State, Local not specified - PPO | Admitting: Physical Therapy

## 2016-02-24 DIAGNOSIS — R262 Difficulty in walking, not elsewhere classified: Secondary | ICD-10-CM

## 2016-02-24 DIAGNOSIS — R2689 Other abnormalities of gait and mobility: Secondary | ICD-10-CM

## 2016-02-24 DIAGNOSIS — M25652 Stiffness of left hip, not elsewhere classified: Secondary | ICD-10-CM | POA: Diagnosis not present

## 2016-02-24 DIAGNOSIS — M25552 Pain in left hip: Secondary | ICD-10-CM

## 2016-02-24 NOTE — Therapy (Signed)
[facility] [address]  [place], [place], [address] Phone: [phone]   Fax:  [phone]  Physical Therapy Treatment  Patient Details  Name: Rhonda Morrow MRN: [id number] Date of Birth: 10-10-1961 Referring Provider: Dene Gentry, MD  Encounter Date: 02/24/2016      PT End of Session - 02/24/16 1631    Visit Number 4   Number of Visits 12   Date for PT Re-Evaluation 03/21/16   PT Start Time 1626  Pt arrived late   PT Stop Time 1704   PT Time Calculation (min) 38 min   Activity Tolerance Patient tolerated treatment well   Behavior During Therapy [facility] for tasks assessed/performed      Past Medical History  Diagnosis Date  . Hypertension   . Deaf   . Asthma   . Thyroid disease   . Diabetes mellitus without complication [facility])     Past Surgical History  Procedure Laterality Date  . Cesarean section    . Cardiac catheterization    . Esophageal manometry N/A 09/29/2013    Procedure: ESOPHAGEAL MANOMETRY (EM);  Surgeon: Milus Banister, MD;  Location: [facility];  Service: Endoscopy;  Laterality: N/A;    There were no vitals filed for this visit.      Subjective Assessment - 02/24/16 1629    Subjective Via sign language interpreter, pt. states her pain is worse today because she forgot to take her pain meds yesterday and then was rushing to get here.   Patient is accompained by: Interpreter   Currently in Pain? Yes   Pain Score 3    Pain Location Hip   Pain Orientation Left;Anterior          Today's Treatment  TherEx NuStep - lvl 3 x 4'   Manual B HS, piriformis, SKTC 2x30" B Hooklying Hip Adductor stretch 2x30"  TherEx Hooklying Hip ADD ball squeeze 10x5" Bridge + Hip ADD isometric ball squeeze 10x3" Hooklying Alternating Hip ABD/ER with blue TB x10 Bridge + Hip ABD isometric with blue TB x10 B sidelying Hip ABD/ER clam with blue TB x10  Manual B ITB stretch 2x30" with  Strumming/MFR to R ITB in L sidelying  TherEx B ITB stretch 2x20" standing at wall          PT Education - 02/24/16 1853    Education provided Yes   Education Details ITB stretch   Person(s) Educated Patient   Methods Explanation;Demonstration;Handout;Tactile cues;Verbal cues   Comprehension Verbalized understanding;Returned demonstration;Verbal cues required;Tactile cues required;Need further instruction             PT Long Term Goals - 02/24/16 1853    PT LONG TERM GOAL #1   Title Pt will be independent with HEP by 03/21/16   Status On-going   PT LONG TERM GOAL #2   Title Pt will demonstrate bilateral hip strength 4/5 or greater without increased pain by 03/21/16   Status On-going   PT LONG TERM GOAL #3   Title Pt will report ability to stand long enough to cook dinner or wash dishes without increased L hip pain by 03/21/16   Status On-going   PT LONG TERM GOAL #4   Title Pt will ascend/descend stairs reciprocally with normal step pattern without increased L hip pain by 03/21/16   Status On-going               Plan - 02/24/16 1834    Clinical Impression Statement Pt  continues to have difficulty relaxing during passive stretching but able to demostrate improving flexibility in hips. Pt noting increased soreness today after forgetting to take her meds yesterday and then rushing to get to therapy but pain rating unchanged from last visit. R ITB tenderness noted during theraband exercises and closer examination revealed increased tightness in R ITB with ttp along distal end most likely bdue to compensation for L hip pain. Performed STM inlcuding strtumming anfd manual stretching to ITB as well as instructed pt in home stretch for ITB.   PT Treatment/Interventions Patient/family education;Therapeutic exercise;Manual techniques;Passive range of motion;Taping;Dry needling;Therapeutic activities;Gait training;Stair training;Neuromuscular re-education;Ultrasound;Moist  Heat;Electrical Stimulation;Cryotherapy;Iontophoresis 4mg /ml Dexamethasone   PT Next Visit Plan Address proximal flexibilty as pt able; Gentle strengthening progression; Gait training to normalize gait; Manual therapy and Modalities PRN for tightness and pain   Consulted and Agree with Plan of Care Patient      Patient will benefit from skilled therapeutic intervention in order to improve the following deficits and impairments:  Pain, Impaired flexibility, Decreased range of motion, Decreased strength, Increased muscle spasms, Difficulty walking, Abnormal gait  Visit Diagnosis: Pain in left hip  Stiffness of left hip, not elsewhere classified  Difficulty in walking, not elsewhere classified  Other abnormalities of gait and mobility     Problem List Patient Active Problem List   Diagnosis Date Noted  . DM (diabetes mellitus) type II controlled, neurological manifestation ([place]) 02/09/2016  . Left hip pain 12/23/2015  . Right hamstring muscle strain 11/18/2015  . Abdominal pain, acute 09/01/2015  . Acute asthma exacerbation 04/16/2015  . Acute bronchitis 04/14/2015  . Vaginal discharge 09/14/2014  . Pap smear for cervical cancer screening 09/14/2014  . Scabies 05/06/2014  . Bed bug bite 05/06/2014  . Allergic rhinitis 04/09/2014  . Rectal itching 04/09/2014  . Bladder spasm 04/09/2014  . Knee pain, left 03/05/2014  . Hypokalemia 02/19/2014  . Nausea with vomiting 02/19/2014  . Diabetes mellitus, type II ([place]) 02/19/2014  . Edema 02/16/2014  . Disorder of rotator cuff 11/12/2013  . Routine general medical examination at a health care facility 08/20/2013  . GERD (gastroesophageal reflux disease) 06/26/2013  . Dysphagia, pharyngoesophageal phase 06/26/2013  . Suprapubic pain 05/12/2013  . Medication side effect 03/25/2013  . Diarrhea 03/25/2013  . Benign positional vertigo 01/30/2013  . Traumatic hematoma of thigh 01/15/2013  . HTN (hypertension) 09/02/2012  . Dry skin  08/22/2012  . External hemorrhoids 08/22/2012  . Obesity 06/30/2012  . Thyromegaly 05/22/2012  . Back pain 05/22/2012  . Elevated glucose 05/22/2012  . Moderate persistent asthma 04/19/2012  . Dyspnea 01/28/2012    Percival Spanish, PT, MPT 02/24/2016, 6:55 PM  [facility] [address]  [place], [place], [address] Phone: [phone]   Fax:  [phone]  Name: Stanislawa Hojnacki MRN: [id number] Date of Birth: 1961-02-08

## 2016-02-25 ENCOUNTER — Ambulatory Visit (INDEPENDENT_AMBULATORY_CARE_PROVIDER_SITE_OTHER): Payer: Federal, State, Local not specified - PPO | Admitting: Family Medicine

## 2016-02-25 ENCOUNTER — Encounter: Payer: Self-pay | Admitting: Family Medicine

## 2016-02-25 ENCOUNTER — Telehealth: Payer: Self-pay | Admitting: Family Medicine

## 2016-02-25 VITALS — BP 132/70 | HR 83 | Temp 98.4°F | Wt 229.0 lb

## 2016-02-25 DIAGNOSIS — E119 Type 2 diabetes mellitus without complications: Secondary | ICD-10-CM

## 2016-02-25 DIAGNOSIS — R400 Somnolence: Secondary | ICD-10-CM

## 2016-02-25 DIAGNOSIS — J4530 Mild persistent asthma, uncomplicated: Secondary | ICD-10-CM

## 2016-02-25 DIAGNOSIS — J45909 Unspecified asthma, uncomplicated: Secondary | ICD-10-CM | POA: Insufficient documentation

## 2016-02-25 DIAGNOSIS — G4710 Hypersomnia, unspecified: Secondary | ICD-10-CM

## 2016-02-25 DIAGNOSIS — J4531 Mild persistent asthma with (acute) exacerbation: Secondary | ICD-10-CM

## 2016-02-25 MED ORDER — GLUCOSE BLOOD VI STRP: ORAL_STRIP | Status: DC

## 2016-02-25 MED ORDER — IPRATROPIUM-ALBUTEROL 0.5-2.5 (3) MG/3ML IN SOLN: 3.0000 mL | Freq: Four times a day (QID) | RESPIRATORY_TRACT | Status: DC | PRN

## 2016-02-25 MED ORDER — NONFORMULARY OR COMPOUNDED ITEM: Status: DC

## 2016-02-25 NOTE — Progress Notes (Signed)
Patient ID: Rhonda Morrow, female    DOB: February 11, 1961  Age: 55 y.o. MRN: [id number]    Subjective:  Subjective HPI Rhonda Morrow presents for F/U ER FOR COUGH AND ASTHMA EXACERBATION.  She feels like the inhaler does not help much any more.  She gets tight in chest and coughs a lot.  The neb in the er made her fell much better.  She also c/o feeling tired all the time and never wakes up feeling refreshed . The patient and her husband are deaf so it is unknown if she snores.  She says her husband sleeps with a mask.  Interpretor is present.   ER visit was reviewed with patient.    Review of Systems  Constitutional: Positive for fatigue. Negative for fever and chills.  HENT: Positive for congestion, rhinorrhea and sinus pressure. Negative for postnasal drip.   Eyes: Negative for discharge, redness and itching.  Respiratory: Positive for cough, chest tightness, shortness of breath and wheezing.   Cardiovascular: Negative for chest pain, palpitations and leg swelling.  Allergic/Immunologic: Negative for environmental allergies.  Neurological: Positive for light-headedness. Negative for weakness.  Psychiatric/Behavioral: Positive for sleep disturbance. Negative for dysphoric mood and agitation. The patient is not nervous/anxious.     History Past Medical History  Diagnosis Date  . Hypertension   . Deaf   . Asthma   . Thyroid disease   . Diabetes mellitus without complication [facility])     She has past surgical history that includes Cesarean section; Cardiac catheterization; and Esophageal manometry (N/A, 09/29/2013).   Her family history includes Diabetes in her father and mother; Heart disease in her mother.She reports that she has never smoked. She has never used smokeless tobacco. She reports that she does not drink alcohol or use illicit drugs.  Current Outpatient Prescriptions on File Prior to Visit  Medication Sig Dispense Refill  . albuterol (PROVENTIL HFA;VENTOLIN HFA) 108 (90 BASE)  MCG/ACT inhaler Inhale 2 puffs into the lungs every 6 (six) hours as needed for wheezing. 1 Inhaler 3  . amLODipine (NORVASC) 5 MG tablet TAKE 1 TABLET (5 MG TOTAL) BY MOUTH DAILY. 30 tablet 6  . cetirizine (ZYRTEC) 10 MG tablet Take 1 tablet (10 mg total) by mouth daily. 30 tablet 11  . cyclobenzaprine (FLEXERIL) 5 MG tablet Take 1 tablet (5 mg total) by mouth at bedtime. 7 tablet 0  . diclofenac (VOLTAREN) 75 MG EC tablet Take 1 tablet (75 mg total) by mouth 2 (two) times daily. 60 tablet 1  . famotidine (PEPCID) 20 MG tablet Take 20 mg by mouth at bedtime.    . Fiber CHEW Chew 1 tablet by mouth daily.    . fluticasone (FLONASE) 50 MCG/ACT nasal spray PLACE 2 SPRAYS INTO BOTH NOSTRILS DAILY. 16 g 4  . hydrochlorothiazide (HYDRODIURIL) 12.5 MG tablet Take 1 tablet (12.5 mg total) by mouth daily. 90 tablet 1  . meclizine (ANTIVERT) 25 MG tablet Take 1 tablet (25 mg total) by mouth 3 (three) times daily as needed. 45 tablet 3  . metFORMIN (GLUCOPHAGE-XR) 500 MG 24 hr tablet TAKE 1 TABLET (500 MG TOTAL) BY MOUTH DAILY WITH BREAKFAST. 90 tablet 1  . methocarbamol (ROBAXIN) 500 MG tablet Take 1 tablet (500 mg total) by mouth 2 (two) times daily. 20 tablet 0  . mometasone-formoterol (DULERA) 100-5 MCG/ACT AERO Inhale 2 puffs into the lungs 2 (two) times daily. 8.8 g 3  . Multiple Vitamin (MULTIVITAMIN) tablet Take 1 tablet by mouth daily.    Marland Kitchen  nitroGLYCERIN (NITRODUR - DOSED IN MG/24 HR) 0.2 mg/hr patch Apply 1/4th patch to affected shoulder, change daily 30 patch 1  . potassium chloride SA (K-DUR,KLOR-CON) 20 MEQ tablet Take 1 tablet (20 mEq total) by mouth daily. 30 tablet 6  . traMADol (ULTRAM) 50 MG tablet Take 1 tablet (50 mg total) by mouth every 8 (eight) hours as needed. 30 tablet 3   No current facility-administered medications on file prior to visit.     Objective:  Objective Physical Exam  Constitutional: She is oriented to person, place, and time. She appears well-developed and  well-nourished.  HENT:  Right Ear: External ear normal.  Left Ear: External ear normal.  + PND + errythema  Eyes: Conjunctivae are normal. Right eye exhibits no discharge. Left eye exhibits no discharge.  Cardiovascular: Normal rate, regular rhythm and normal heart sounds.   No murmur heard. Pulmonary/Chest: Effort normal. No respiratory distress. She has decreased breath sounds. She has no wheezes. She has no rales. She exhibits no tenderness.  Musculoskeletal: She exhibits no edema.  Lymphadenopathy:    She has cervical adenopathy.  Neurological: She is alert and oriented to person, place, and time.  Psychiatric: She has a normal mood and affect. Her behavior is normal. Judgment and thought content normal.  Nursing note and vitals reviewed.  BP 132/70 mmHg  Pulse 83  Temp(Src) 98.4 F (36.9 C) (Oral)  Wt 229 lb (103.874 kg)  SpO2 98%  LMP 05/20/2012 Wt Readings from Last 3 Encounters:  02/25/16 229 lb (103.874 kg)  02/08/16 232 lb 3.2 oz (105.325 kg)  02/03/16 234 lb (106.142 kg)     Lab Results  Component Value Date   WBC 6.2 11/23/2015   HGB 13.6 11/23/2015   HCT 41.5 11/23/2015   PLT 221.0 11/23/2015   GLUCOSE 102* 02/08/2016   CHOL 151 02/08/2016   TRIG 84.0 02/08/2016   HDL 53.00 02/08/2016   LDLCALC 81 02/08/2016   ALT 25 02/08/2016   AST 23 02/08/2016   NA 140 02/08/2016   K 3.5 02/08/2016   CL 101 02/08/2016   CREATININE 0.83 02/08/2016   BUN 13 02/08/2016   CO2 32 02/08/2016   TSH 1.41 07/01/2015   INR 1.00 12/30/2012   HGBA1C 6.8* 02/08/2016   MICROALBUR 0.8 10/27/2015    Dg Chest 2 View  02/21/2016  CLINICAL DATA:  Dry cough for 1 day EXAM: CHEST  2 VIEW COMPARISON:  09/01/2015 FINDINGS: There is no focal parenchymal opacity. There is no pleural effusion or pneumothorax. There is mild stable cardiomegaly. The osseous structures are unremarkable. IMPRESSION: No active cardiopulmonary disease. Electronically Signed   By: Kathreen Devoid   On:  02/21/2016 14:00     Assessment & Plan:  Plan I have discontinued [name]'s predniSONE and benzonatate. I am also having her start on ipratropium-albuterol, glucose blood, and NONFORMULARY OR COMPOUNDED ITEM. Additionally, I am having her maintain her meclizine, multivitamin, Fiber, famotidine, mometasone-formoterol, amLODipine, methocarbamol, nitroGLYCERIN, albuterol, potassium chloride SA, cyclobenzaprine, fluticasone, diclofenac, cetirizine, traMADol, metFORMIN, and hydrochlorothiazide.  Meds ordered this encounter  Medications  . ipratropium-albuterol (DUONEB) 0.5-2.5 (3) MG/3ML SOLN    Sig: Take 3 mLs by nebulization every 6 (six) hours as needed.    Dispense:  360 mL    Refill:  3  . glucose blood test strip    Sig: Use as instructed--- one touch verio test strips    Dispense:  100 each    Refill:  12  . NONFORMULARY OR COMPOUNDED ITEM  Sig: Nebulizer   DX ASTHMA    Dispense:  1 each    Refill:  0    Problem List Items Addressed This Visit    None    Visit Diagnoses    Asthma with acute exacerbation, mild persistent    -  Primary    Relevant Medications    ipratropium-albuterol (DUONEB) 0.5-2.5 (3) MG/3ML SOLN    NONFORMULARY OR COMPOUNDED ITEM    Daytime somnolence        Relevant Orders    Ambulatory referral to Pulmonology    Asthma, chronic, mild persistent, uncomplicated        Relevant Medications    ipratropium-albuterol (DUONEB) 0.5-2.5 (3) MG/3ML SOLN    Other Relevant Orders    Ambulatory referral to Pulmonology    Type 2 diabetes mellitus without complication, without long-term current use of insulin (HCC)        Relevant Medications    glucose blood test strip       Follow-up: Return if symptoms worsen or fail to improve.  Ann Held, DO

## 2016-02-25 NOTE — Progress Notes (Signed)
Pre visit review using our clinic review tool, if applicable. No additional management support is needed unless otherwise documented below in the visit note. 

## 2016-02-25 NOTE — Telephone Encounter (Signed)
Pt called on call.  Took husbands albuterol neb x 3 doses accidentally. Heart pounding, racing, breathing better.  Discussed with pt SE will wear off, use her medication only at correct dose. Reviewed meds. Only repeat albuterol neb if needed in 6-8 hours. If not continuing to improve or SOB worsens.. Go to ER.

## 2016-02-25 NOTE — Assessment & Plan Note (Signed)
con't dulera Neb rx given to pt Will refer to pulmonary since recent worsening of symptoms and extreme daytime fatigue

## 2016-02-25 NOTE — Patient Instructions (Signed)
Asthma, Adult Asthma is a recurring condition in which the airways tighten and narrow. Asthma can make it difficult to breathe. It can cause coughing, wheezing, and shortness of breath. Asthma episodes, also called asthma attacks, range from minor to life-threatening. Asthma cannot be cured, but medicines and lifestyle changes can help control it. CAUSES Asthma is believed to be caused by inherited (genetic) and environmental factors, but its exact cause is unknown. Asthma may be triggered by allergens, lung infections, or irritants in the air. Asthma triggers are different for each person. Common triggers include:   Animal dander.  Dust mites.  Cockroaches.  Pollen from trees or grass.  Mold.  Smoke.  Air pollutants such as dust, household cleaners, hair sprays, aerosol sprays, paint fumes, strong chemicals, or strong odors.  Cold air, weather changes, and winds (which increase molds and pollens in the air).  Strong emotional expressions such as crying or laughing hard.  Stress.  Certain medicines (such as aspirin) or types of drugs (such as beta-blockers).  Sulfites in foods and drinks. Foods and drinks that may contain sulfites include dried fruit, potato chips, and sparkling grape juice.  Infections or inflammatory conditions such as the flu, a cold, or an inflammation of the nasal membranes (rhinitis).  Gastroesophageal reflux disease (GERD).  Exercise or strenuous activity. SYMPTOMS Symptoms may occur immediately after asthma is triggered or many hours later. Symptoms include:  Wheezing.  Excessive nighttime or early morning coughing.  Frequent or severe coughing with a common cold.  Chest tightness.  Shortness of breath. DIAGNOSIS  The diagnosis of asthma is made by a review of your medical history and a physical exam. Tests may also be performed. These may include:  Lung function studies. These tests show how much air you breathe in and out.  Allergy  tests.  Imaging tests such as X-rays. TREATMENT  Asthma cannot be cured, but it can usually be controlled. Treatment involves identifying and avoiding your asthma triggers. It also involves medicines. There are 2 classes of medicine used for asthma treatment:   Controller medicines. These prevent asthma symptoms from occurring. They are usually taken every day.  Reliever or rescue medicines. These quickly relieve asthma symptoms. They are used as needed and provide short-term relief. Your health care provider will help you create an asthma action plan. An asthma action plan is a written plan for managing and treating your asthma attacks. It includes a list of your asthma triggers and how they may be avoided. It also includes information on when medicines should be taken and when their dosage should be changed. An action plan may also involve the use of a device called a peak flow meter. A peak flow meter measures how well the lungs are working. It helps you monitor your condition. HOME CARE INSTRUCTIONS   Take medicines only as directed by your health care provider. Speak with your health care provider if you have questions about how or when to take the medicines.  Use a peak flow meter as directed by your health care provider. Record and keep track of readings.  Understand and use the action plan to help minimize or stop an asthma attack without needing to seek medical care.  Control your home environment in the following ways to help prevent asthma attacks:  Do not smoke. Avoid being exposed to secondhand smoke.  Change your heating and air conditioning filter regularly.  Limit your use of fireplaces and wood stoves.  Get rid of pests (such as roaches   and mice) and their droppings.  Throw away plants if you see mold on them.  Clean your floors and dust regularly. Use unscented cleaning products.  Try to have someone else vacuum for you regularly. Stay out of rooms while they are  being vacuumed and for a short while afterward. If you vacuum, use a dust mask from a hardware store, a double-layered or microfilter vacuum cleaner bag, or a vacuum cleaner with a HEPA filter.  Replace carpet with wood, tile, or vinyl flooring. Carpet can trap dander and dust.  Use allergy-proof pillows, mattress covers, and box spring covers.  Wash bed sheets and blankets every week in hot water and dry them in a dryer.  Use blankets that are made of polyester or cotton.  Clean bathrooms and kitchens with bleach. If possible, have someone repaint the walls in these rooms with mold-resistant paint. Keep out of the rooms that are being cleaned and painted.  Wash hands frequently. SEEK MEDICAL CARE IF:   You have wheezing, shortness of breath, or a cough even if taking medicine to prevent attacks.  The colored mucus you cough up (sputum) is thicker than usual.  Your sputum changes from clear or white to yellow, green, gray, or bloody.  You have any problems that may be related to the medicines you are taking (such as a rash, itching, swelling, or trouble breathing).  You are using a reliever medicine more than 2-3 times per week.  Your peak flow is still at 50-79% of your personal best after following your action plan for 1 hour.  You have a fever. SEEK IMMEDIATE MEDICAL CARE IF:   You seem to be getting worse and are unresponsive to treatment during an asthma attack.  You are short of breath even at rest.  You get short of breath when doing very little physical activity.  You have difficulty eating, drinking, or talking due to asthma symptoms.  You develop chest pain.  You develop a fast heartbeat.  You have a bluish color to your lips or fingernails.  You are light-headed, dizzy, or faint.  Your peak flow is less than 50% of your personal best.   This information is not intended to replace advice given to you by your health care provider. Make sure you discuss any  questions you have with your health care provider.   Document Released: 10/23/2005 Document Revised: 07/14/2015 Document Reviewed: 05/22/2013 [employer] Interactive Patient Education 2016 [employer]  

## 2016-02-28 ENCOUNTER — Ambulatory Visit: Payer: Federal, State, Local not specified - PPO | Admitting: Physical Therapy

## 2016-02-28 DIAGNOSIS — M25552 Pain in left hip: Secondary | ICD-10-CM

## 2016-02-28 DIAGNOSIS — R2689 Other abnormalities of gait and mobility: Secondary | ICD-10-CM

## 2016-02-28 DIAGNOSIS — R262 Difficulty in walking, not elsewhere classified: Secondary | ICD-10-CM

## 2016-02-28 DIAGNOSIS — M25652 Stiffness of left hip, not elsewhere classified: Secondary | ICD-10-CM | POA: Diagnosis not present

## 2016-02-28 NOTE — Therapy (Signed)
[facility] [address]  [place], [place], [address] Phone: [phone]   Fax:  [phone]  Physical Therapy Treatment  Patient Details  Name: Rhonda Morrow MRN: [id number] Date of Birth: 25-Aug-1961 Referring Provider: Dene Gentry, MD  Encounter Date: 02/28/2016      PT End of Session - 02/28/16 1702    Visit Number 5   Number of Visits 12   Date for PT Re-Evaluation 03/21/16   PT Start Time 1620   PT Stop Time 1702   PT Time Calculation (min) 42 min   Activity Tolerance Patient tolerated treatment well   Behavior During Therapy [facility] for tasks assessed/performed      Past Medical History  Diagnosis Date  . Hypertension   . Deaf   . Asthma   . Thyroid disease   . Diabetes mellitus without complication [facility])     Past Surgical History  Procedure Laterality Date  . Cesarean section    . Cardiac catheterization    . Esophageal manometry N/A 09/29/2013    Procedure: ESOPHAGEAL MANOMETRY (EM);  Surgeon: Milus Banister, MD;  Location: [facility];  Service: Endoscopy;  Laterality: N/A;    There were no vitals filed for this visit.      Subjective Assessment - 02/28/16 1632    Subjective Via sign language interpreter, pt. states her pain is better today but notes the difference due to the rainy weather.   Patient is accompained by: Interpreter   Patient Stated Goals "Be able to go up stairs and walk without pain."   Currently in Pain? Yes   Pain Score --  2-3/10   Pain Location Hip   Pain Orientation Left;Anterior           Today's Treatment  TherEx NuStep - lvl 5 x 4'   Manual B HS, piriformis, SKTC 2x30" B Hooklying Hip Adductor stretch 2x30" B ITB stretch 2x30"   TherEx Bridge + Hip ADD isometric ball squeeze 10x3" Hooklying Alternating Hip ABD/ER with blue TB x10 Bridge + Hip ABD isometric with blue TB 10x3" Bridge + Alternating Hip ABD/ER with blue TB x10 B sidelying Hip  ABD/ER clam with blue TB x10 Straight leg bridge with feet on peanut ball 10x3" B HS curl with feet on peanut ball x10 Seated Hamstring Curl with blue TB x10, B Seated Fitter Leg Press (1 black/1 blue) x10, B           PT Long Term Goals - 02/24/16 1853    PT LONG TERM GOAL #1   Title Pt will be independent with HEP by 03/21/16   Status On-going   PT LONG TERM GOAL #2   Title Pt will demonstrate bilateral hip strength 4/5 or greater without increased pain by 03/21/16   Status On-going   PT LONG TERM GOAL #3   Title Pt will report ability to stand long enough to cook dinner or wash dishes without increased L hip pain by 03/21/16   Status On-going   PT LONG TERM GOAL #4   Title Pt will ascend/descend stairs reciprocally with normal step pattern without increased L hip pain by 03/21/16   Status On-going               Plan - 02/28/16 1815    Clinical Impression Statement Pt reporting increased incidence of hamstring cramps with bridging exercises, therefore increased emphasis on stretching but pt continuing to have difficulty relaxing during manual stretching and  difficulty comprehending technique for self-passive stretch. Pt was able to tolerate increasing complexity of exercises and wil continue to progress to more standing/CKC activities.   PT Treatment/Interventions Patient/family education;Therapeutic exercise;Manual techniques;Passive range of motion;Taping;Dry needling;Therapeutic activities;Gait training;Stair training;Neuromuscular re-education;Ultrasound;Moist Heat;Electrical Stimulation;Cryotherapy;Iontophoresis 4mg /ml Dexamethasone   PT Next Visit Plan *MD note for 03/06/16 appt; Proximal LE/hip flexibilty as pt able; Gentle strengthening progression; Gait training to normalize gait; Manual therapy and Modalities PRN for tightness and pain   Consulted and Agree with Plan of Care Patient      Patient will benefit from skilled therapeutic intervention in order to improve  the following deficits and impairments:  Pain, Impaired flexibility, Decreased range of motion, Decreased strength, Increased muscle spasms, Difficulty walking, Abnormal gait  Visit Diagnosis: Pain in left hip  Stiffness of left hip, not elsewhere classified  Difficulty in walking, not elsewhere classified  Other abnormalities of gait and mobility     Problem List Patient Active Problem List   Diagnosis Date Noted  . Asthma in adult 02/25/2016  . DM (diabetes mellitus) type II controlled, neurological manifestation ([place]) 02/09/2016  . Left hip pain 12/23/2015  . Right hamstring muscle strain 11/18/2015  . Abdominal pain, acute 09/01/2015  . Acute asthma exacerbation 04/16/2015  . Acute bronchitis 04/14/2015  . Vaginal discharge 09/14/2014  . Pap smear for cervical cancer screening 09/14/2014  . Scabies 05/06/2014  . Bed bug bite 05/06/2014  . Allergic rhinitis 04/09/2014  . Rectal itching 04/09/2014  . Bladder spasm 04/09/2014  . Knee pain, left 03/05/2014  . Hypokalemia 02/19/2014  . Nausea with vomiting 02/19/2014  . Diabetes mellitus, type II ([place]) 02/19/2014  . Edema 02/16/2014  . Disorder of rotator cuff 11/12/2013  . Routine general medical examination at a health care facility 08/20/2013  . GERD (gastroesophageal reflux disease) 06/26/2013  . Dysphagia, pharyngoesophageal phase 06/26/2013  . Suprapubic pain 05/12/2013  . Medication side effect 03/25/2013  . Diarrhea 03/25/2013  . Benign positional vertigo 01/30/2013  . Traumatic hematoma of thigh 01/15/2013  . HTN (hypertension) 09/02/2012  . Dry skin 08/22/2012  . External hemorrhoids 08/22/2012  . Obesity 06/30/2012  . Thyromegaly 05/22/2012  . Back pain 05/22/2012  . Elevated glucose 05/22/2012  . Moderate persistent asthma 04/19/2012  . Dyspnea 01/28/2012    Percival Spanish, PT, MPT 02/28/2016, 6:26 PM  [facility] [address]  [place], [place], [address] Phone: [phone]   Fax:  [phone]  Name: Rhonda Morrow MRN: [id number] Date of Birth: [birth year]

## 2016-03-02 ENCOUNTER — Telehealth: Payer: Self-pay | Admitting: Family Medicine

## 2016-03-02 ENCOUNTER — Ambulatory Visit: Payer: Federal, State, Local not specified - PPO

## 2016-03-02 DIAGNOSIS — R262 Difficulty in walking, not elsewhere classified: Secondary | ICD-10-CM

## 2016-03-02 DIAGNOSIS — M25552 Pain in left hip: Secondary | ICD-10-CM

## 2016-03-02 DIAGNOSIS — M25652 Stiffness of left hip, not elsewhere classified: Secondary | ICD-10-CM | POA: Diagnosis not present

## 2016-03-02 DIAGNOSIS — R2689 Other abnormalities of gait and mobility: Secondary | ICD-10-CM | POA: Diagnosis not present

## 2016-03-02 NOTE — Therapy (Signed)
[facility] [address]  [place], [place], [address] Phone: [phone]   Fax:  [phone]  Physical Therapy Treatment  Patient Details  Name: Rhonda Morrow MRN: [id number] Date of Birth: 08/01/1961 Referring Provider: Dene Gentry, MD  Encounter Date: 03/02/2016      PT End of Session - 03/02/16 1646    Visit Number 6   Number of Visits 12   Date for PT Re-Evaluation 03/21/16   PT Start Time 1620   PT Stop Time 1700   PT Time Calculation (min) 40 min   Activity Tolerance Patient tolerated treatment well   Behavior During Therapy [facility] for tasks assessed/performed      Past Medical History  Diagnosis Date  . Hypertension   . Deaf   . Asthma   . Thyroid disease   . Diabetes mellitus without complication [facility])     Past Surgical History  Procedure Laterality Date  . Cesarean section    . Cardiac catheterization    . Esophageal manometry N/A 09/29/2013    Procedure: ESOPHAGEAL MANOMETRY (EM);  Surgeon: Milus Banister, MD;  [facility];  Service: Endoscopy;  Laterality: N/A;    There were no vitals filed for this visit.      Subjective Assessment - 03/02/16 1629    Subjective via sign language interpreter, pt. states no pain currently.     Patient is accompained by: Interpreter   Patient Stated Goals "Be able to go up stairs and walk without pain."   Currently in Pain? No/denies   Pain Score 0-No pain   Multiple Pain Sites No            [facility] PT Assessment - 03/02/16 1813    Strength   Strength Assessment Site Hip   Right Hip Flexion 3+/5   Right Hip Extension 3+/5   Right Hip ABduction 4-/5   Right Hip ADduction 4-/5   Left Hip Flexion 3/5   Left Hip Extension 3/5   Left Hip ABduction 3-/5   Left Hip ADduction 3-/5      Today's Treatment  TherEx: NuStep - lvl 5 x 4'   Manual: L HS, piriformis, SKTC 2x30" L ITB stretch 2x30"   TherEx: Bridge + Hip ADD isometric  ball squeeze 10x3" Hooklying B Hip ABD/ER with blue TB x10 Bridge + Hip ABD isometric with blue TB 10x3" B sidelying Hip ABD/ER clam with blue TB x10 Straight leg bridge with feet on peanut ball 10x3" B HS curl with feet on peanut ball x10 Seated L Fitter Leg Press (1 black/1 blue) x15         PT Long Term Goals - 03/02/16 1759    PT LONG TERM GOAL #1   Title Pt will be independent with HEP by 03/21/16   Status Partially Met  03/02/16: Pt. able to recall most HEP activities however would benefit from further review.     PT LONG TERM GOAL #2   Title Pt will demonstrate bilateral hip strength 4/5 or greater without increased pain by 03/21/16   Status On-going  03/02/16: Pt. unable to demo 4/5 hip strength with any hip motions at this time.     PT LONG TERM GOAL #3   Title Pt will report ability to stand long enough to cook dinner or wash dishes without increased L hip pain by 03/21/16   Status Partially Met  03/02/16: Pt. reports she is able to stand while cooking dinner  and washing dishes however starts to feel L hip pain before she is done frequently.     PT LONG TERM GOAL #4   Title Pt will ascend/descend stairs reciprocally with normal step pattern without increased L hip pain by 03/21/16   Status Partially Met  03/02/16: Pt. able to ascend/descend stairs reciprocally with normal step pattern however reports pain with descending; some B hip weakness present with descending.                  Plan - 03/02/16 1808    Clinical Impression Statement Pt. continues to demo difficulty comprehending self-stretch HEP activities and difficulty relaxing during manual stretches in treatment.  Pt. no longer with frequent reports of L anterior hip pain with therex however continues to demo significant B hip weakness L>R unable to meet 4/5 hip strength in any motions.     PT Treatment/Interventions Patient/family education;Therapeutic exercise;Manual techniques;Passive range of motion;Taping;Dry  needling;Therapeutic activities;Gait training;Stair training;Neuromuscular re-education;Ultrasound;Moist Heat;Electrical Stimulation;Cryotherapy;Iontophoresis 86m/ml Dexamethasone   PT Next Visit Plan *MD note for 03/06/16 appt; Proximal LE/hip flexibilty as pt able; Gentle strengthening progression; Gait training to normalize gait; Manual therapy and Modalities PRN for tightness and pain      Patient will benefit from skilled therapeutic intervention in order to improve the following deficits and impairments:  Pain, Impaired flexibility, Decreased range of motion, Decreased strength, Increased muscle spasms, Difficulty walking, Abnormal gait  Visit Diagnosis: Pain in left hip  Stiffness of left hip, not elsewhere classified  Difficulty in walking, not elsewhere classified  Other abnormalities of gait and mobility     Problem List Patient Active Problem List   Diagnosis Date Noted  . Asthma in adult 02/25/2016  . DM (diabetes mellitus) type II controlled, neurological manifestation ([place] 02/09/2016  . Left hip pain 12/23/2015  . Right hamstring muscle strain 11/18/2015  . Abdominal pain, acute 09/01/2015  . Acute asthma exacerbation 04/16/2015  . Acute bronchitis 04/14/2015  . Vaginal discharge 09/14/2014  . Pap smear for cervical cancer screening 09/14/2014  . Scabies 05/06/2014  . Bed bug bite 05/06/2014  . Allergic rhinitis 04/09/2014  . Rectal itching 04/09/2014  . Bladder spasm 04/09/2014  . Knee pain, left 03/05/2014  . Hypokalemia 02/19/2014  . Nausea with vomiting 02/19/2014  . Diabetes mellitus, type II ([place] 02/19/2014  . Edema 02/16/2014  . Disorder of rotator cuff 11/12/2013  . Routine general medical examination at a health care facility 08/20/2013  . GERD (gastroesophageal reflux disease) 06/26/2013  . Dysphagia, pharyngoesophageal phase 06/26/2013  . Suprapubic pain 05/12/2013  . Medication side effect 03/25/2013  . Diarrhea 03/25/2013  . Benign positional  vertigo 01/30/2013  . Traumatic hematoma of thigh 01/15/2013  . HTN (hypertension) 09/02/2012  . Dry skin 08/22/2012  . External hemorrhoids 08/22/2012  . Obesity 06/30/2012  . Thyromegaly 05/22/2012  . Back pain 05/22/2012  . Elevated glucose 05/22/2012  . Moderate persistent asthma 04/19/2012  . Dyspnea 01/28/2012    MBess Harvest PTA 03/02/2016, 6:17 PM  [facility] [place] [address]Phone: [phone]  Fax:  [phone] Name: MDanicka HourihanMRN: [id number] of Birth: 113-Jan-1962

## 2016-03-02 NOTE — Telephone Encounter (Signed)
Caller name: Benjie Karvonen with [employer] Can be reached: [phone], Option 2  Reason for call: Patient Ref# [id number] Calling regarding faxed request for back brace. It was faxed 02/28/16 to [phone]. She will refax today.

## 2016-03-02 NOTE — Therapy (Deleted)
[facility] [address]  [place], [place], [address] Phone: [phone]   Fax:  [phone]  Physical Therapy Treatment  Patient Details  Name: Rhonda Morrow MRN: [id number] Date of Birth: [birth year] Referring Provider: Dene Gentry, MD  Encounter Date: 03/02/2016      PT End of Session - 03/02/16 1646    Visit Number 6   Number of Visits 12   Date for PT Re-Evaluation 03/21/16   PT Start Time 1620   PT Stop Time 1700   PT Time Calculation (min) 40 min   Activity Tolerance Patient tolerated treatment well   Behavior During Therapy [facility] for tasks assessed/performed      Past Medical History  Diagnosis Date  . Hypertension   . Deaf   . Asthma   . Thyroid disease   . Diabetes mellitus without complication [facility])     Past Surgical History  Procedure Laterality Date  . Cesarean section    . Cardiac catheterization    . Esophageal manometry N/A 09/29/2013    Procedure: ESOPHAGEAL MANOMETRY (EM);  Surgeon: Milus Banister, MD;  [facility];  Service: Endoscopy;  Laterality: N/A;    There were no vitals filed for this visit.      Subjective Assessment - 03/02/16 1629    Subjective via sign language interpreter, pt. states no pain currently.     Patient is accompained by: Interpreter   Patient Stated Goals "Be able to go up stairs and walk without pain."   Currently in Pain? No/denies   Pain Score 0-No pain   Multiple Pain Sites No      Today's Treatment  TherEx: NuStep - lvl 5 x 4'   Manual: L HS, piriformis, SKTC 2x30" L ITB stretch 2x30"   TherEx: Bridge + Hip ADD isometric ball squeeze 10x3" Hooklying B Hip ABD/ER with blue TB x10 Bridge + Hip ABD isometric with blue TB 10x3" B sidelying Hip ABD/ER clam with blue TB x10 Straight leg bridge with feet on peanut ball 10x3" B HS curl with feet on peanut ball x10 Seated L Fitter Leg Press (1 black/1 blue) x15         PT  Long Term Goals - 03/02/16 1759    PT LONG TERM GOAL #1   Title Pt will be independent with HEP by 03/21/16   Status Partially Met  03/02/16: Pt. able to recall most HEP activities however would benefit from further review.     PT LONG TERM GOAL #2   Title Pt will demonstrate bilateral hip strength 4/5 or greater without increased pain by 03/21/16   Status On-going   PT LONG TERM GOAL #3   Title Pt will report ability to stand long enough to cook dinner or wash dishes without increased L hip pain by 03/21/16   Status Partially Met  03/02/16: Pt. reports she is able to stand while cooking dinner and washing dishes however starts to feel L hip pain before she is done frequently.     PT LONG TERM GOAL #4   Title Pt will ascend/descend stairs reciprocally with normal step pattern without increased L hip pain by 03/21/16   Status Partially Met  03/02/16: Pt. able to ascend/descend stairs reciprocally with normal step pattern however reports pain with descending; some B hip weakness present with descending.                  Plan - 03/02/16 1808  Clinical Impression Statement Pt. continues to demo difficulty comprehending self-stretch HEP activities and difficulty relaxing during manual stretches in treatment.  Pt. no longer with frequent reports of L anterior hip pain with therex however continues to demo significant L hip weakness.     PT Next Visit Plan *MD note for 03/06/16 appt; Proximal LE/hip flexibilty as pt able; Gentle strengthening progression; Gait training to normalize gait; Manual therapy and Modalities PRN for tightness and pain      Patient will benefit from skilled therapeutic intervention in order to improve the following deficits and impairments:  Pain, Impaired flexibility, Decreased range of motion, Decreased strength, Increased muscle spasms, Difficulty walking, Abnormal gait  Visit Diagnosis: Pain in left hip  Stiffness of left hip, not elsewhere classified  Difficulty  in walking, not elsewhere classified  Other abnormalities of gait and mobility     Problem List Patient Active Problem List   Diagnosis Date Noted  . Asthma in adult 02/25/2016  . DM (diabetes mellitus) type II controlled, neurological manifestation ([place]) 02/09/2016  . Left hip pain 12/23/2015  . Right hamstring muscle strain 11/18/2015  . Abdominal pain, acute 09/01/2015  . Acute asthma exacerbation 04/16/2015  . Acute bronchitis 04/14/2015  . Vaginal discharge 09/14/2014  . Pap smear for cervical cancer screening 09/14/2014  . Scabies 05/06/2014  . Bed bug bite 05/06/2014  . Allergic rhinitis 04/09/2014  . Rectal itching 04/09/2014  . Bladder spasm 04/09/2014  . Knee pain, left 03/05/2014  . Hypokalemia 02/19/2014  . Nausea with vomiting 02/19/2014  . Diabetes mellitus, type II ([place]) 02/19/2014  . Edema 02/16/2014  . Disorder of rotator cuff 11/12/2013  . Routine general medical examination at a health care facility 08/20/2013  . GERD (gastroesophageal reflux disease) 06/26/2013  . Dysphagia, pharyngoesophageal phase 06/26/2013  . Suprapubic pain 05/12/2013  . Medication side effect 03/25/2013  . Diarrhea 03/25/2013  . Benign positional vertigo 01/30/2013  . Traumatic hematoma of thigh 01/15/2013  . HTN (hypertension) 09/02/2012  . Dry skin 08/22/2012  . External hemorrhoids 08/22/2012  . Obesity 06/30/2012  . Thyromegaly 05/22/2012  . Back pain 05/22/2012  . Elevated glucose 05/22/2012  . Moderate persistent asthma 04/19/2012  . Dyspnea 01/28/2012    Bess Harvest, PTA 03/02/2016, 6:14 PM  [facility] [address]  [place], [place], [address] Phone: [phone]   Fax:  [phone]  Name: Rhonda Morrow MRN: [id number] Date of Birth: 31-Jul-1961

## 2016-03-02 NOTE — Telephone Encounter (Signed)
The patient declined the back brace when she was in the office, she said she does not have back issues.     KP

## 2016-03-06 ENCOUNTER — Ambulatory Visit (INDEPENDENT_AMBULATORY_CARE_PROVIDER_SITE_OTHER): Payer: Federal, State, Local not specified - PPO | Admitting: Family Medicine

## 2016-03-06 ENCOUNTER — Ambulatory Visit: Payer: Federal, State, Local not specified - PPO | Attending: Family Medicine

## 2016-03-06 ENCOUNTER — Encounter: Payer: Self-pay | Admitting: Family Medicine

## 2016-03-06 VITALS — BP 133/65 | HR 71 | Ht 64.0 in | Wt 230.0 lb

## 2016-03-06 DIAGNOSIS — R2689 Other abnormalities of gait and mobility: Secondary | ICD-10-CM | POA: Diagnosis not present

## 2016-03-06 DIAGNOSIS — M25652 Stiffness of left hip, not elsewhere classified: Secondary | ICD-10-CM | POA: Insufficient documentation

## 2016-03-06 DIAGNOSIS — M25552 Pain in left hip: Secondary | ICD-10-CM | POA: Diagnosis not present

## 2016-03-06 DIAGNOSIS — R262 Difficulty in walking, not elsewhere classified: Secondary | ICD-10-CM | POA: Insufficient documentation

## 2016-03-06 NOTE — Patient Instructions (Signed)
Continue with physical therapy for your hip - you are doing really well! Follow up with me in 4-6 weeks for likely the last visit unless you're having problems.

## 2016-03-06 NOTE — Therapy (Signed)
[facility] [address]  [place], [place], [address] Phone: [phone]   Fax:  [phone]  Physical Therapy Treatment  Patient Details  Name: Rhonda Morrow MRN: [id number] Date of Birth: [birth year] Referring Provider: Dene Gentry, MD  Encounter Date: 03/06/2016      PT End of Session - 03/06/16 1129    Visit Number 7   Number of Visits 12   Date for PT Re-Evaluation 03/21/16   PT Start Time 1104   PT Stop Time 1145   PT Time Calculation (min) 41 min   Activity Tolerance Patient tolerated treatment well   Behavior During Therapy [facility] for tasks assessed/performed      Past Medical History  Diagnosis Date  . Hypertension   . Deaf   . Asthma   . Thyroid disease   . Diabetes mellitus without complication [facility])     Past Surgical History  Procedure Laterality Date  . Cesarean section    . Cardiac catheterization    . Esophageal manometry N/A 09/29/2013    Procedure: ESOPHAGEAL MANOMETRY (EM);  Surgeon: Milus Banister, MD;  Location: [facility];  Service: Endoscopy;  Laterality: N/A;    There were no vitals filed for this visit.      Subjective Assessment - 03/06/16 1112    Subjective via sign languate interpreter, pt. states no pain currently.     Patient is accompained by: Interpreter   Patient Stated Goals "Be able to go up stairs and walk without pain."   Currently in Pain? No/denies   Pain Score 0-No pain   Pain Location Hip   Pain Orientation Left;Anterior   Pain Descriptors / Indicators Sharp;Cramping   Pain Type Acute pain   Pain Onset More than a month ago   Pain Frequency Intermittent   Aggravating Factors  Stairs, moving fast, prolonged standing   Pain Relieving Factors lying down with feet up, meds   Multiple Pain Sites No        Today's Treatment:  Therex: NuStep: level 4, 4 min B HS, piriformis, SKTC 2x30" Bridge + Hip ADD isometric ball squeeze 10x3" Hooklying B Hip  ABD/ER with blue TB x 10 reps  Bridge + Hip ABD isometric with blue TB 10x3" B sidelying Hip ABD/ER clam with blue TB x10 Straight leg bridge with feet on peanut ball 10x3" Seated L Fitter Leg Press (1 black/1 blue) x 15 reps  Standing B Fitter hip extension, abduction x 15 reps BATCA leg curl machine x 15 reps          PT Long Term Goals - 03/02/16 1759    PT LONG TERM GOAL #1   Title Pt will be independent with HEP by 03/21/16   Status Partially Met  03/02/16: Pt. able to recall most HEP activities however would benefit from further review.     PT LONG TERM GOAL #2   Title Pt will demonstrate bilateral hip strength 4/5 or greater without increased pain by 03/21/16   Status On-going  03/02/16: Pt. unable to demo 4/5 hip strength with any hip motions at this time.     PT LONG TERM GOAL #3   Title Pt will report ability to stand long enough to cook dinner or wash dishes without increased L hip pain by 03/21/16   Status Partially Met  03/02/16: Pt. reports she is able to stand while cooking dinner and washing dishes however starts to feel L hip pain before she  is done frequently.     PT LONG TERM GOAL #4   Title Pt will ascend/descend stairs reciprocally with normal step pattern without increased L hip pain by 03/21/16   Status Partially Met  03/02/16: Pt. able to ascend/descend stairs reciprocally with normal step pattern however reports pain with descending; some B hip weakness present with descending.                  Plan - 03/06/16 1131    Clinical Impression Statement Pt. reports MD advised another 4-6 weeks of PT at today's visit.  Pt. continues to progress to more standing hip / knee strengthening activity and reports no L hip pain with this advancement.  Pt. reports she has been able to perform the HEP a few days over the weekend however continues to be limited with time due to taking care of her mother.     Clinical Impairments Affecting Rehab Potential Pt guarding movement;  Obesity; Language barrier   PT Treatment/Interventions Patient/family education;Therapeutic exercise;Manual techniques;Passive range of motion;Taping;Dry needling;Therapeutic activities;Gait training;Stair training;Neuromuscular re-education;Ultrasound;Moist Heat;Electrical Stimulation;Cryotherapy;Iontophoresis 40m/ml Dexamethasone   PT Next Visit Plan Have PT add visits to the POC per MD instruction; Continue with hip / knee strengthening activity per pt. tolerance.        Patient will benefit from skilled therapeutic intervention in order to improve the following deficits and impairments:  Pain, Impaired flexibility, Decreased range of motion, Decreased strength, Increased muscle spasms, Difficulty walking, Abnormal gait  Visit Diagnosis: Pain in left hip  Stiffness of left hip, not elsewhere classified  Difficulty in walking, not elsewhere classified  Other abnormalities of gait and mobility     Problem List Patient Active Problem List   Diagnosis Date Noted  . Asthma in adult 02/25/2016  . DM (diabetes mellitus) type II controlled, neurological manifestation ([facility] 02/09/2016  . Left hip pain 12/23/2015  . Right hamstring muscle strain 11/18/2015  . Abdominal pain, acute 09/01/2015  . Acute asthma exacerbation 04/16/2015  . Acute bronchitis 04/14/2015  . Vaginal discharge 09/14/2014  . Pap smear for cervical cancer screening 09/14/2014  . Scabies 05/06/2014  . Bed bug bite 05/06/2014  . Allergic rhinitis 04/09/2014  . Rectal itching 04/09/2014  . Bladder spasm 04/09/2014  . Knee pain, left 03/05/2014  . Hypokalemia 02/19/2014  . Nausea with vomiting 02/19/2014  . Diabetes mellitus, type II ([place] 02/19/2014  . Edema 02/16/2014  . Disorder of rotator cuff 11/12/2013  . Routine general medical examination at a health care facility 08/20/2013  . GERD (gastroesophageal reflux disease) 06/26/2013  . Dysphagia, pharyngoesophageal phase 06/26/2013  . Suprapubic pain  05/12/2013  . Medication side effect 03/25/2013  . Diarrhea 03/25/2013  . Benign positional vertigo 01/30/2013  . Traumatic hematoma of thigh 01/15/2013  . HTN (hypertension) 09/02/2012  . Dry skin 08/22/2012  . External hemorrhoids 08/22/2012  . Obesity 06/30/2012  . Thyromegaly 05/22/2012  . Back pain 05/22/2012  . Elevated glucose 05/22/2012  . Moderate persistent asthma 04/19/2012  . Dyspnea 01/28/2012    MBess Harvest PTA 03/06/2016, 12:44 PM  [facility] [place] [address]Phone: [phone]  Fax:  [phone] Name: Rhonda BurnetteMRN: [id number] of Birth: 102-04-1961

## 2016-03-06 NOTE — Assessment & Plan Note (Signed)
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  Independently reviewed radiographs and she does have mild arthritis - this is most likely cause of majority of her pain.  S/p intraarticular injection and physical therapy - much improved.  Will continue therapy and follow up in 4-6 weeks.

## 2016-03-06 NOTE — Progress Notes (Signed)
PCP: Ann Held, DO Consultation requested by: Mackie Pai PA-C   Subjective:   HPI: Patient is a 55 y.o. female here for left groin pain.  2/15: Patient reports she's had left groin pain that's worsened over past 2 weeks. No known injury or trauma. Pain 5/10, sharp. Worse with bearing weight. Taking diclofenac and tramadol. No prior issues with this hip. No swelling, bruising. Radiographs with mild DJD. No skin changes, fever, other complaints. No recent illness.  5/1: Patient reports she's doing very well. Doing physical therapy and home exercises (though has difficulty remembering the home exercises). Pain level is now 0/10. No numbness, skin changes. No radiation of pain. Sore when she walks - can be sharp though.  Past Medical History  Diagnosis Date  . Hypertension   . Deaf   . Asthma   . Thyroid disease   . Diabetes mellitus without complication [facility])     Current Outpatient Prescriptions on File Prior to Visit  Medication Sig Dispense Refill  . albuterol (PROVENTIL HFA;VENTOLIN HFA) 108 (90 BASE) MCG/ACT inhaler Inhale 2 puffs into the lungs every 6 (six) hours as needed for wheezing. 1 Inhaler 3  . amLODipine (NORVASC) 5 MG tablet TAKE 1 TABLET (5 MG TOTAL) BY MOUTH DAILY. 30 tablet 6  . cetirizine (ZYRTEC) 10 MG tablet Take 1 tablet (10 mg total) by mouth daily. 30 tablet 11  . cyclobenzaprine (FLEXERIL) 5 MG tablet Take 1 tablet (5 mg total) by mouth at bedtime. 7 tablet 0  . diclofenac (VOLTAREN) 75 MG EC tablet Take 1 tablet (75 mg total) by mouth 2 (two) times daily. 60 tablet 1  . famotidine (PEPCID) 20 MG tablet Take 20 mg by mouth at bedtime.    . Fiber CHEW Chew 1 tablet by mouth daily.    . fluticasone (FLONASE) 50 MCG/ACT nasal spray PLACE 2 SPRAYS INTO BOTH NOSTRILS DAILY. 16 g 4  . glucose blood test strip Use as instructed--- one touch verio test strips 100 each 12  . hydrochlorothiazide (HYDRODIURIL) 12.5 MG tablet Take 1 tablet  (12.5 mg total) by mouth daily. 90 tablet 1  . ipratropium-albuterol (DUONEB) 0.5-2.5 (3) MG/3ML SOLN Take 3 mLs by nebulization every 6 (six) hours as needed. 360 mL 3  . meclizine (ANTIVERT) 25 MG tablet Take 1 tablet (25 mg total) by mouth 3 (three) times daily as needed. 45 tablet 3  . metFORMIN (GLUCOPHAGE-XR) 500 MG 24 hr tablet TAKE 1 TABLET (500 MG TOTAL) BY MOUTH DAILY WITH BREAKFAST. 90 tablet 1  . methocarbamol (ROBAXIN) 500 MG tablet Take 1 tablet (500 mg total) by mouth 2 (two) times daily. 20 tablet 0  . mometasone-formoterol (DULERA) 100-5 MCG/ACT AERO Inhale 2 puffs into the lungs 2 (two) times daily. 8.8 g 3  . Multiple Vitamin (MULTIVITAMIN) tablet Take 1 tablet by mouth daily.    . nitroGLYCERIN (NITRODUR - DOSED IN MG/24 HR) 0.2 mg/hr patch Apply 1/4th patch to affected shoulder, change daily 30 patch 1  . NONFORMULARY OR COMPOUNDED ITEM Nebulizer   DX ASTHMA 1 each 0  . potassium chloride SA (K-DUR,KLOR-CON) 20 MEQ tablet Take 1 tablet (20 mEq total) by mouth daily. 30 tablet 6  . traMADol (ULTRAM) 50 MG tablet Take 1 tablet (50 mg total) by mouth every 8 (eight) hours as needed. 30 tablet 3   No current facility-administered medications on file prior to visit.    Past Surgical History  Procedure Laterality Date  . Cesarean section    .  Cardiac catheterization    . Esophageal manometry N/A 09/29/2013    Procedure: ESOPHAGEAL MANOMETRY (EM);  Surgeon: Milus Banister, MD;  [facility];  Service: Endoscopy;  Laterality: N/A;    Allergies  Allergen Reactions  . Losartan Shortness Of Breath  . Augmentin [Amoxicillin-Pot Clavulanate] Diarrhea  . Oxycodone-Acetaminophen Nausea And Vomiting    Social History   Social History  . Marital Status: Married    Spouse Name: N/A  . Number of Children: N/A  . Years of Education: N/A   Occupational History  . diabled    Social History Main Topics  . Smoking status: Never Smoker   . Smokeless tobacco: Never  Used  . Alcohol Use: No  . Drug Use: No  . Sexual Activity: Not on file   Other Topics Concern  . Not on file   Social History Narrative    Family History  Problem Relation Age of Onset  . Diabetes Mother   . Diabetes Father   . Heart disease Mother     BP 133/65 mmHg  Pulse 71  Ht 5\' 4"  (1.626 m)  Wt 230 lb (104.327 kg)  BMI 39.46 kg/m2  LMP 05/20/2012  Review of Systems: See HPI above.    Objective:  Physical Exam:  Gen: NAD  Back: No gross deformity, scoliosis. No TTP anterior groin over hip joint.  TTP greater trochanter.  No midline or bony TTP.  No paraspinal tenderness. FROM. Strength LEs 5/5 all muscle groups except 5-/5 strength hip abduction.  No pain. Negative SLRs. Sensation intact to light touch bilaterally.  Left hip: Negative logroll.   Negative fabers and piriformis stretches (pain only in groin).    Assessment & Plan:  1. 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  Independently reviewed radiographs and she does have mild arthritis - this is most likely cause of majority of her pain.  S/p intraarticular injection and physical therapy - much improved.  Will continue therapy and follow up in 4-6 weeks.

## 2016-03-09 ENCOUNTER — Ambulatory Visit: Payer: Federal, State, Local not specified - PPO | Admitting: Physical Therapy

## 2016-03-13 ENCOUNTER — Ambulatory Visit: Payer: Federal, State, Local not specified - PPO

## 2016-03-15 ENCOUNTER — Encounter: Payer: Self-pay | Admitting: Internal Medicine

## 2016-03-15 ENCOUNTER — Telehealth: Payer: Self-pay | Admitting: Internal Medicine

## 2016-03-15 ENCOUNTER — Ambulatory Visit (INDEPENDENT_AMBULATORY_CARE_PROVIDER_SITE_OTHER): Payer: Federal, State, Local not specified - PPO | Admitting: Internal Medicine

## 2016-03-15 ENCOUNTER — Other Ambulatory Visit: Payer: Worker's Compensation

## 2016-03-15 DIAGNOSIS — R0600 Dyspnea, unspecified: Secondary | ICD-10-CM

## 2016-03-15 DIAGNOSIS — J45901 Unspecified asthma with (acute) exacerbation: Secondary | ICD-10-CM | POA: Insufficient documentation

## 2016-03-15 DIAGNOSIS — R0689 Other abnormalities of breathing: Principal | ICD-10-CM

## 2016-03-15 DIAGNOSIS — J4541 Moderate persistent asthma with (acute) exacerbation: Secondary | ICD-10-CM

## 2016-03-15 DIAGNOSIS — R7989 Other specified abnormal findings of blood chemistry: Secondary | ICD-10-CM

## 2016-03-15 LAB — D-DIMER, QUANTITATIVE (NOT AT ARMC): D DIMER QUANT: 0.63 ug{FEU}/mL — AB (ref 0.00–0.48)

## 2016-03-15 MED ORDER — PREDNISONE 10 MG PO TABS: ORAL_TABLET | ORAL | Status: DC

## 2016-03-15 MED ORDER — MOMETASONE FURO-FORMOTEROL FUM 100-5 MCG/ACT IN AERO: 2.0000 | INHALATION_SPRAY | Freq: Two times a day (BID) | RESPIRATORY_TRACT | Status: DC

## 2016-03-15 MED ORDER — ALBUTEROL SULFATE HFA 108 (90 BASE) MCG/ACT IN AERS: 2.0000 | INHALATION_SPRAY | Freq: Four times a day (QID) | RESPIRATORY_TRACT | Status: DC | PRN

## 2016-03-15 NOTE — Patient Instructions (Signed)
ICD-9-CM ICD-10-CM   1. Dyspnea and respiratory abnormalities 786.09 R06.00     R06.89   2. Asthma with acute exacerbation, moderate persistent 493.92 J45.41     Unclear if this is acute asthma -  Plan  = d- dimer blood test = stat. IF abnormal, will call you and get ct chest - for now treat as as asthma attack due to pollen  - Take prednisone 40 mg daily x 2 days, then 20mg  daily x 2 days, then 10mg  daily x 2 days, then 5mg  daily x 2 days and stop - restart dulera 2 puff twice daily - ensure compliance  - take sample/refill - use albuteral as needed  Followup  3 months or sooner if needed

## 2016-03-15 NOTE — Progress Notes (Signed)
Subjective:     Patient ID: Rhonda Morrow, female   DOB: 08/26/1961, 55 y.o.   MRN: [id number]  HPI   55 year old female never smoker with known asthma Patient is deaf and mute  Test PFt date 01/24/12 shows mixed obstruction - restriction but greater restriction with low dlco  - fev1 1/48L/58%, Ratio 82, TLC 56, DLCO 14/51%, No BD response  CT chest 02/09/2012 showed no evidence of scarring   03/15/2015 follow-up asthma Interpreter present for visit today.  Patient returns for a six-month follow-up for asthma. She remains on Dulera twice daily. Says overall she is doing well. She denies any flare of cough, wheezing, shortness of breath. She is trying to exercise but gets winded at times. Uses her rescue inhaler occasionally . She denies any fever, chest pain, orthopnea, PND, or increased leg swelling , or syncope. She denies any hospitalizations or emergency room visits. Takes Zyrtec for allergy, controlled well    OV 10/22/2015  Chief Complaint  Patient presents with  . Follow-up    Pt c/o SOB with exertion. Pt denies cough/wheeze/CP/tightness. Pt does use albuterol HFA and it does relieve symptoms.    Follow-up asthma. Presents with interpreter Britt Bolognese.  As best as I can tell through the interpretation her asthma stable. Shortness of breath is mild and stable. It appears on exertion. She is not actively wheezing or having cough or active asthma symptoms. Nevertheless she says that she uses a Dulera twice a day but also her albuterol twice a day. Therefore does little bit unclear but as best as I can tell she is able to differentiate between maintenance and rescue inhaler.off note she is no longer on lisinoprilhe did later on she told me that she never uses albuterol and she feels well  Given lack of clarity with the subjective symptoms we did an asthma control  Test - a [employer] branded scale - in this she reported thather asthma prevents her from going to work most of the  time but then she says that she is going to work THE time. She GERD is "too. She only feels short of breath once or twice a week and she GERD score 4. She also experiences wheezing or cough or shortness of breath or chest tightness once or twice a week and she GERD score 4. In addition she says that she uses albuterol for rescue per 3 times per week and she gave at the score 3. She also feels that she somewhat controlled with her asthma and she GERD score 3. Total score is 16 and suggest asthma might not be under control.Overall do not know what to believe  We also did exhaled nitric oxide- could not perform the test   OV 03/15/2016  Chief Complaint  Patient presents with  . Acute Visit    pt c/o increased SOB, after using duoneb & dulera breathing slightly improved. denies cough or wheezing.   Acute visit for this asthma patient. Who is deaf. Interpreter is Romelle Starcher  She reports compliance with Dulera although she ran out yesterday. Then since 2 nights ago started having some nighttime chest tightness and shortness of breath. Yesterday apparently multiple episodes of acute dyspnea needing albuterol inhaler and nebulizer for rescue with only partial relief. No associated cough or fever or sputum production and denies any leg swelling. She feels that it might be related to asthma she also states that it feels somewhat different. She has no PE risk factors other than obesity. She says she went  to the ER in the last few months . Chart review shows she went that 02/21/2016 and treated as asthma exacerbation.      has a past medical history of Hypertension; Deaf; Asthma; Thyroid disease; and Diabetes mellitus without complication ([place]).   reports that she has never smoked. She has never used smokeless tobacco.  Past Surgical History  Procedure Laterality Date  . Cesarean section    . Cardiac catheterization    . Esophageal manometry N/A 09/29/2013    Procedure: ESOPHAGEAL MANOMETRY (EM);   Surgeon: Milus Banister, MD;  [facility];  Service: Endoscopy;  Laterality: N/A;    Allergies  Allergen Reactions  . Losartan Shortness Of Breath  . Augmentin [Amoxicillin-Pot Clavulanate] Diarrhea  . Oxycodone-Acetaminophen Nausea And Vomiting    Immunization History  Administered Date(s) Administered  . DT 04/07/2011  . Influenza Split 08/07/2011, 08/05/2012, 08/22/2012, 08/31/2014  . Influenza,inj,Quad PF,36+ Mos 08/20/2013, 10/22/2015  . Pneumococcal Polysaccharide-23 10/02/2012    Family History  Problem Relation Age of Onset  . Diabetes Mother   . Diabetes Father   . Heart disease Mother      Current outpatient prescriptions:  .  albuterol (PROVENTIL HFA;VENTOLIN HFA) 108 (90 BASE) MCG/ACT inhaler, Inhale 2 puffs into the lungs every 6 (six) hours as needed for wheezing., Disp: 1 Inhaler, Rfl: 3 .  amLODipine (NORVASC) 5 MG tablet, TAKE 1 TABLET (5 MG TOTAL) BY MOUTH DAILY., Disp: 30 tablet, Rfl: 6 .  cetirizine (ZYRTEC) 10 MG tablet, Take 1 tablet (10 mg total) by mouth daily., Disp: 30 tablet, Rfl: 11 .  cyclobenzaprine (FLEXERIL) 5 MG tablet, Take 1 tablet (5 mg total) by mouth at bedtime., Disp: 7 tablet, Rfl: 0 .  diclofenac (VOLTAREN) 75 MG EC tablet, Take 1 tablet (75 mg total) by mouth 2 (two) times daily., Disp: 60 tablet, Rfl: 1 .  famotidine (PEPCID) 20 MG tablet, Take 20 mg by mouth at bedtime., Disp: , Rfl:  .  Fiber CHEW, Chew 1 tablet by mouth daily., Disp: , Rfl:  .  fluticasone (FLONASE) 50 MCG/ACT nasal spray, PLACE 2 SPRAYS INTO BOTH NOSTRILS DAILY., Disp: 16 g, Rfl: 4 .  glucose blood test strip, Use as instructed--- one touch verio test strips, Disp: 100 each, Rfl: 12 .  hydrochlorothiazide (HYDRODIURIL) 12.5 MG tablet, Take 1 tablet (12.5 mg total) by mouth daily., Disp: 90 tablet, Rfl: 1 .  ipratropium-albuterol (DUONEB) 0.5-2.5 (3) MG/3ML SOLN, Take 3 mLs by nebulization every 6 (six) hours as needed., Disp: 360 mL, Rfl: 3 .  meclizine  (ANTIVERT) 25 MG tablet, Take 1 tablet (25 mg total) by mouth 3 (three) times daily as needed., Disp: 45 tablet, Rfl: 3 .  metFORMIN (GLUCOPHAGE-XR) 500 MG 24 hr tablet, TAKE 1 TABLET (500 MG TOTAL) BY MOUTH DAILY WITH BREAKFAST., Disp: 90 tablet, Rfl: 1 .  methocarbamol (ROBAXIN) 500 MG tablet, Take 1 tablet (500 mg total) by mouth 2 (two) times daily., Disp: 20 tablet, Rfl: 0 .  mometasone-formoterol (DULERA) 100-5 MCG/ACT AERO, Inhale 2 puffs into the lungs 2 (two) times daily., Disp: 8.8 g, Rfl: 3 .  Multiple Vitamin (MULTIVITAMIN) tablet, Take 1 tablet by mouth daily., Disp: , Rfl:  .  nitroGLYCERIN (NITRODUR - DOSED IN MG/24 HR) 0.2 mg/hr patch, Apply 1/4th patch to affected shoulder, change daily, Disp: 30 patch, Rfl: 1 .  NONFORMULARY OR COMPOUNDED ITEM, Nebulizer   DX ASTHMA, Disp: 1 each, Rfl: 0 .  potassium chloride SA (K-DUR,KLOR-CON) 20 MEQ tablet, Take  1 tablet (20 mEq total) by mouth daily., Disp: 30 tablet, Rfl: 6 .  traMADol (ULTRAM) 50 MG tablet, Take 1 tablet (50 mg total) by mouth every 8 (eight) hours as needed., Disp: 30 tablet, Rfl: 3    Review of Systems     Objective:   Physical Exam  Constitutional: She is oriented to person, place, and time. She appears well-developed and well-nourished. No distress.  Body mass index is 39.97 kg/(m^2).   HENT:  Head: Normocephalic and atraumatic.  Right Ear: External ear normal.  Left Ear: External ear normal.  Mouth/Throat: Oropharynx is clear and moist. No oropharyngeal exudate.  Eyes: Conjunctivae and EOM are normal. Pupils are equal, round, and reactive to light. Right eye exhibits no discharge. Left eye exhibits no discharge. No scleral icterus.  Neck: Normal range of motion. Neck supple. No JVD present. No tracheal deviation present. No thyromegaly present.  Cardiovascular: Normal rate, regular rhythm, normal heart sounds and intact distal pulses.  Exam reveals no gallop and no friction rub.   No murmur  heard. Pulmonary/Chest: Effort normal and breath sounds normal. No respiratory distress. She has no wheezes. She has no rales. She exhibits no tenderness.  Abdominal: Soft. Bowel sounds are normal. She exhibits no distension and no mass. There is no tenderness. There is no rebound and no guarding.  Musculoskeletal: Normal range of motion. She exhibits no edema or tenderness.  Lymphadenopathy:    She has no cervical adenopathy.  Neurological: She is alert and oriented to person, place, and time. She has normal reflexes. No cranial nerve deficit. She exhibits normal muscle tone. Coordination normal.  Skin: Skin is warm and dry. No rash noted. She is not diaphoretic. No erythema. No pallor.  Psychiatric: She has a normal mood and affect. Her behavior is normal. Judgment and thought content normal.  Vitals reviewed.   Filed Vitals:   03/15/16 0954  BP: 132/68  Pulse: 78  Height: 5\' 4"  (1.626 m)  Weight: 233 lb (105.688 kg)  SpO2: 97%        Assessment:       ICD-9-CM ICD-10-CM   1. Dyspnea and respiratory abnormalities 786.09 R06.00     R06.89   2. Asthma with acute exacerbation, moderate persistent 493.92 J45.41        Plan:      Unclear if this is acute asthma -  Plan  = d- dimer blood test = stat. IF abnormal, will call you and get ct chest - for now treat as as asthma attack due to pollen  - Take prednisone 40 mg daily x 2 days, then 20mg  daily x 2 days, then 10mg  daily x 2 days, then 5mg  daily x 2 days and stop - restart dulera 2 puff twice daily - ensure compliance  - take sample/refill - use albuteral as needed  Followup  3 months or sooner if needed    Dr. Brand Males, M.D., [facility].C.P Pulmonary and Critical Care Medicine Staff Physician [place] Pager: [phone], If no answer or between  15:00h - 7:00h: call [phone]  03/15/2016 10:20 AM

## 2016-03-15 NOTE — Telephone Encounter (Signed)
D-dimer marginally elevated. Have her go on 03/16/16 to get CT angio for PE rule out

## 2016-03-16 ENCOUNTER — Ambulatory Visit: Payer: Federal, State, Local not specified - PPO | Admitting: Physical Therapy

## 2016-03-16 NOTE — Telephone Encounter (Signed)
Pt needs CT Angio today per MR LM for pt with interpreter line  Will send to Dr Chase Caller as Juluis Rainier of unable to reach

## 2016-03-19 NOTE — Telephone Encounter (Signed)
Pls try to reach her again

## 2016-03-20 ENCOUNTER — Inpatient Hospital Stay: Admission: RE | Admit: 2016-03-20 | Payer: Self-pay | Source: Ambulatory Visit

## 2016-03-20 ENCOUNTER — Encounter: Payer: Self-pay | Admitting: Family Medicine

## 2016-03-20 ENCOUNTER — Ambulatory Visit (INDEPENDENT_AMBULATORY_CARE_PROVIDER_SITE_OTHER): Payer: Federal, State, Local not specified - PPO | Admitting: Family Medicine

## 2016-03-20 ENCOUNTER — Ambulatory Visit: Payer: Federal, State, Local not specified - PPO | Admitting: Physical Therapy

## 2016-03-20 VITALS — BP 135/73 | HR 69 | Ht 64.0 in | Wt 233.0 lb

## 2016-03-20 DIAGNOSIS — R2689 Other abnormalities of gait and mobility: Secondary | ICD-10-CM | POA: Diagnosis not present

## 2016-03-20 DIAGNOSIS — R262 Difficulty in walking, not elsewhere classified: Secondary | ICD-10-CM | POA: Diagnosis not present

## 2016-03-20 DIAGNOSIS — M67912 Unspecified disorder of synovium and tendon, left shoulder: Secondary | ICD-10-CM

## 2016-03-20 DIAGNOSIS — M25652 Stiffness of left hip, not elsewhere classified: Secondary | ICD-10-CM | POA: Diagnosis not present

## 2016-03-20 DIAGNOSIS — M25552 Pain in left hip: Secondary | ICD-10-CM

## 2016-03-20 NOTE — Patient Instructions (Signed)
Follow up with me in 3 months for your shoulder. Continue with the home exercises, medicine only if needed.

## 2016-03-20 NOTE — Therapy (Signed)
[facility] [address]  [place], [place], [address] Phone: [phone]   Fax:  [phone]  Physical Therapy Treatment  Patient Details  Name: Rhonda Morrow MRN: [id number] Date of Birth: 1961-06-04 Referring Provider: Dene Gentry, MD  Encounter Date: 03/20/2016      PT End of Session - 03/20/16 1112    Visit Number 8   Number of Visits 14   Date for PT Re-Evaluation 05/01/16   PT Start Time 1105   PT Stop Time 1151   PT Time Calculation (min) 46 min   Activity Tolerance Patient tolerated treatment well   Behavior During Therapy [facility] for tasks assessed/performed      Past Medical History  Diagnosis Date  . Hypertension   . Deaf   . Asthma   . Thyroid disease   . Diabetes mellitus without complication [facility])     Past Surgical History  Procedure Laterality Date  . Cesarean section    . Cardiac catheterization    . Esophageal manometry N/A 09/29/2013    Procedure: ESOPHAGEAL MANOMETRY (EM);  Surgeon: Milus Banister, MD;  Location: [facility];  Service: Endoscopy;  Laterality: N/A;    There were no vitals filed for this visit.      Subjective Assessment - 03/20/16 1110    Subjective Via sign languate interpreter, pt. states pain has been less and less. Typically no pain on sunny days, but still needing to take pain meds on inclement weather days. Saw Dr. Barbaraann Barthel this morning and states he is pleased with her progress.   Patient is accompained by: Interpreter   Patient Stated Goals "Be able to go up stairs and walk without pain."   Currently in Pain? No/denies            [facility] PT Assessment - 03/20/16 1105    Strength   Strength Assessment Site Hip   Right/Left Hip Right;Left   Right Hip Flexion 4-/5   Right Hip Extension 4-/5   Right Hip ABduction 4/5   Right Hip ADduction 4/5   Left Hip Flexion 3+/5   Left Hip Extension 3+/5   Left Hip ABduction 3+/5   Left Hip ADduction 3+/5           Today's Treatment  TherEx NuStep - level 4 x 4'  Stair training targeting reciprocal ascent and descent  TherEx B HS with strap, piriformis, SKTC 2x30" Bridge 10x5" Seated B Hip ABD/ER clam x10 Hooklying Alternating Hip ABD/ER with blue TB x10  Bridge + Hip ABD isometric with blue TB 10x3" B sidelying Hip ABD/ER clam with blue TB x10 Bridge + Hip ADD isometric ball squeeze 10x5" 4" L eccentric lowering with heel touch x10 Standing B Fitter hip extension, abduction (2 blue) x10 each          PT Long Term Goals - 03/20/16 1113    PT LONG TERM GOAL #1   Title Pt will be independent with HEP by 05/01/16   Status Partially Met  Independent with current HEP   PT LONG TERM GOAL #2   Title Pt will demonstrate bilateral hip strength 4/5 or greater without increased pain by 05/01/16   Status Partially Met  Met for R hip ABD/ADD   PT LONG TERM GOAL #3   Title Pt will report ability to stand long enough to cook dinner or wash dishes without increased L hip pain by 05/01/16   Status Partially Met  Pt. reports she  is able to stand while cooking dinner and washing dishes but still feels like she has to work quickly or she starts to feel L hip pain before she is done.   PT LONG TERM GOAL #4   Title Pt will ascend/descend stairs reciprocally with normal step pattern without increased L hip pain by 05/01/16   Status Partially Met  Pt. able to ascend stairs with reciprocal pattern if she moves slowly, but reverts to step-to pattern on descent unless cued for reciprocal pattern. B hip weakness present with descending.                  Plan - 03/20/16 1218    Clinical Impression Statement Pt states MD is pleased with her progress and wants her to continue PT, therefore PT extended x 6 weeks at 1x/wk per pt preference. Pt reporting less pain overall, typically with no pain on "sunny" days but still experiencing pain with incluement weather. Standing tolerance improving but  still lmited by pain after period of time. Pt able to negotiate stairs reciprocally but at slow pace with increased effort required on descent. Progressed HEP to blue TB and added more standin exercises during therapy session with good pt tolerance.   Clinical Impairments Affecting Rehab Potential Pt guarding movement; Obesity; Language barrier   PT Treatment/Interventions Patient/family education;Therapeutic exercise;Manual techniques;Passive range of motion;Taping;Dry needling;Therapeutic activities;Gait training;Stair training;Neuromuscular re-education;Ultrasound;Moist Heat;Electrical Stimulation;Cryotherapy;Iontophoresis 37m/ml Dexamethasone   PT Next Visit Plan Continue with hip / knee strengthening activity per pt. tolerance.     Consulted and Agree with Plan of Care Patient      Patient will benefit from skilled therapeutic intervention in order to improve the following deficits and impairments:  Pain, Impaired flexibility, Decreased range of motion, Decreased strength, Increased muscle spasms, Difficulty walking, Abnormal gait  Visit Diagnosis: Pain in left hip  Stiffness of left hip, not elsewhere classified  Difficulty in walking, not elsewhere classified  Other abnormalities of gait and mobility     Problem List Patient Active Problem List   Diagnosis Date Noted  . Dyspnea and respiratory abnormalities 03/15/2016  . Asthma with acute exacerbation 03/15/2016  . Asthma in adult 02/25/2016  . DM (diabetes mellitus) type II controlled, neurological manifestation ([place] 02/09/2016  . Left hip pain 12/23/2015  . Right hamstring muscle strain 11/18/2015  . Abdominal pain, acute 09/01/2015  . Acute asthma exacerbation 04/16/2015  . Acute bronchitis 04/14/2015  . Vaginal discharge 09/14/2014  . Pap smear for cervical cancer screening 09/14/2014  . Scabies 05/06/2014  . Bed bug bite 05/06/2014  . Allergic rhinitis 04/09/2014  . Rectal itching 04/09/2014  . Bladder spasm  04/09/2014  . Knee pain, left 03/05/2014  . Hypokalemia 02/19/2014  . Nausea with vomiting 02/19/2014  . Diabetes mellitus, type II ([place] 02/19/2014  . Edema 02/16/2014  . Disorder of rotator cuff 11/12/2013  . Routine general medical examination at a health care facility 08/20/2013  . GERD (gastroesophageal reflux disease) 06/26/2013  . Dysphagia, pharyngoesophageal phase 06/26/2013  . Suprapubic pain 05/12/2013  . Medication side effect 03/25/2013  . Diarrhea 03/25/2013  . Benign positional vertigo 01/30/2013  . Traumatic hematoma of thigh 01/15/2013  . HTN (hypertension) 09/02/2012  . Dry skin 08/22/2012  . External hemorrhoids 08/22/2012  . Obesity 06/30/2012  . Thyromegaly 05/22/2012  . Back pain 05/22/2012  . Elevated glucose 05/22/2012  . Moderate persistent asthma 04/19/2012  . Dyspnea 01/28/2012    JPercival Spanish PT, MPT 03/20/2016, 12:30 PM  [facility] [address]  [place], [place], [address] Phone: [phone]   Fax:  [phone]  Name: Rhonda Morrow MRN: [id number] Date of Birth: [birth year]

## 2016-03-20 NOTE — Telephone Encounter (Signed)
I spoke with Juliann Pulse- interrupter for patient at this time; She has relayed information to patient and aware that we are placing order for patient to have CTa today. I have placed order to [facility] and Rodena Piety is helping get appt arranged for patient. She will contact Juliann Pulse to relay information to patient. Nothing more needed at this time.

## 2016-03-20 NOTE — Telephone Encounter (Signed)
LMTCB with interpreter service.

## 2016-03-21 ENCOUNTER — Ambulatory Visit (INDEPENDENT_AMBULATORY_CARE_PROVIDER_SITE_OTHER)
Admission: RE | Admit: 2016-03-21 | Discharge: 2016-03-21 | Disposition: A | Discharge status: Home / Self Care | Payer: Federal, State, Local not specified - PPO | Source: Ambulatory Visit | Attending: Internal Medicine | Admitting: Internal Medicine

## 2016-03-21 DIAGNOSIS — R0600 Dyspnea, unspecified: Secondary | ICD-10-CM | POA: Diagnosis not present

## 2016-03-21 DIAGNOSIS — R791 Abnormal coagulation profile: Secondary | ICD-10-CM

## 2016-03-21 DIAGNOSIS — R0602 Shortness of breath: Secondary | ICD-10-CM | POA: Diagnosis not present

## 2016-03-21 MED ORDER — IOPAMIDOL (ISOVUE-370) INJECTION 76%
80.0000 mL | Freq: Once | INTRAVENOUS | Status: AC | PRN
  Administered 2016-03-21: 80 mL via INTRAVENOUS

## 2016-03-21 NOTE — Assessment & Plan Note (Signed)
consistent with rotator cuff strain.  Radiographs negative for fracture.  Doing well with home exercises now.  Still with occasional pain with a lot of activity and weather change.  Will push out next follow-up appointment to 3 months from now.  Tylenol/nsaids as needed.  Continue full duty.

## 2016-03-21 NOTE — Progress Notes (Signed)
PCP: Ann Held, DO Referred by Dr. Larose Kells  Subjective:   HPI: Patient is a 55 y.o. female here for left shoulder injury.  8/18: Interpreter used throughout visit. Patient reports injury occurred on 7/29 at work. She drives a forklift at work. Felt a pop in left shoulder while doing so - mentioned some of equipment was broken and she did not realize this. Seemed to be mild but later that day when doing sign language she felt a lot of pain in the left shoulder. Pain currently 7/10. Tried aleve, advil, icing. Has not had radiographs. + night pain. Is right handed. Remotely  Had problems with this joint - had an injection and completely improved.  9/22: Interpreter present for visit. Patient reports pain still 6/10 at work, 5/10 at rest. Has not heard yet about physical therapy. No other changes.  11/7: Interpreter present. Unfortunately patient still has not heard about physical therapy. Pain level 5/10. No skin changes, fever, other complaints. Using pain pills, occasional ice. Doing home exercises.  12/19: Patient reports only slight improvement from last visit - 4/10 level pain, dull lateral left shoulder. Is taking voltaren but needs a refill - this helps. Avoiding overhead activities when possible because they bother her. Doing home exercises. Has not heard from workers comp regarding physical therapy. No skin changes, other complaints.  12/06/15: Patient feels mildly improved though still has not had physical therapy approved nor an MRI. Pain level 2/10 at rest, dull lateral left shoulder. Worse with overhead motions. No skin changes, other complaints. Doing home exercises.  3/30: Patient reports she has mainly been resting her shoulder now. No pain currently - gets some soreness with full motions. Pain level 0/10. No skin changes, fever, other complaints. No radiation.  5/15: Patient reports she feels much better. Still notices pain especially  with weather change, a lot of movement. Pain currently 0/10. Taking diclofenac as needed. No skin changes, fever, other complaints.  Past Medical History  Diagnosis Date  . Hypertension   . Deaf   . Asthma   . Thyroid disease   . Diabetes mellitus without complication [facility])     Current Outpatient Prescriptions on File Prior to Visit  Medication Sig Dispense Refill  . albuterol (PROVENTIL HFA;VENTOLIN HFA) 108 (90 Base) MCG/ACT inhaler Inhale 2 puffs into the lungs every 6 (six) hours as needed for wheezing. 1 Inhaler 3  . amLODipine (NORVASC) 5 MG tablet TAKE 1 TABLET (5 MG TOTAL) BY MOUTH DAILY. 30 tablet 6  . cetirizine (ZYRTEC) 10 MG tablet Take 1 tablet (10 mg total) by mouth daily. 30 tablet 11  . cyclobenzaprine (FLEXERIL) 5 MG tablet Take 1 tablet (5 mg total) by mouth at bedtime. 7 tablet 0  . diclofenac (VOLTAREN) 75 MG EC tablet Take 1 tablet (75 mg total) by mouth 2 (two) times daily. 60 tablet 1  . famotidine (PEPCID) 20 MG tablet Take 20 mg by mouth at bedtime.    . Fiber CHEW Chew 1 tablet by mouth daily.    . fluticasone (FLONASE) 50 MCG/ACT nasal spray PLACE 2 SPRAYS INTO BOTH NOSTRILS DAILY. 16 g 4  . glucose blood test strip Use as instructed--- one touch verio test strips 100 each 12  . hydrochlorothiazide (HYDRODIURIL) 12.5 MG tablet Take 1 tablet (12.5 mg total) by mouth daily. 90 tablet 1  . ipratropium-albuterol (DUONEB) 0.5-2.5 (3) MG/3ML SOLN Take 3 mLs by nebulization every 6 (six) hours as needed. 360 mL 3  . meclizine (ANTIVERT)  25 MG tablet Take 1 tablet (25 mg total) by mouth 3 (three) times daily as needed. 45 tablet 3  . metFORMIN (GLUCOPHAGE-XR) 500 MG 24 hr tablet TAKE 1 TABLET (500 MG TOTAL) BY MOUTH DAILY WITH BREAKFAST. 90 tablet 1  . methocarbamol (ROBAXIN) 500 MG tablet Take 1 tablet (500 mg total) by mouth 2 (two) times daily. 20 tablet 0  . mometasone-formoterol (DULERA) 100-5 MCG/ACT AERO Inhale 2 puffs into the lungs 2 (two) times daily. 8.8 g  3  . Multiple Vitamin (MULTIVITAMIN) tablet Take 1 tablet by mouth daily.    . nitroGLYCERIN (NITRODUR - DOSED IN MG/24 HR) 0.2 mg/hr patch Apply 1/4th patch to affected shoulder, change daily 30 patch 1  . NONFORMULARY OR COMPOUNDED ITEM Nebulizer   DX ASTHMA 1 each 0  . potassium chloride SA (K-DUR,KLOR-CON) 20 MEQ tablet Take 1 tablet (20 mEq total) by mouth daily. 30 tablet 6  . predniSONE (DELTASONE) 10 MG tablet 40 mg daily x 2 days, then 20mg  daily x 2 days, then 10mg  daily x 2 days, then 5mg  daily x 2 days and stop 15 tablet 0  . traMADol (ULTRAM) 50 MG tablet Take 1 tablet (50 mg total) by mouth every 8 (eight) hours as needed. 30 tablet 3   No current facility-administered medications on file prior to visit.    Past Surgical History  Procedure Laterality Date  . Cesarean section    . Cardiac catheterization    . Esophageal manometry N/A 09/29/2013    Procedure: ESOPHAGEAL MANOMETRY (EM);  Surgeon: Milus Banister, MD;  Location: [facility];  Service: Endoscopy;  Laterality: N/A;    Allergies  Allergen Reactions  . Losartan Shortness Of Breath  . Augmentin [Amoxicillin-Pot Clavulanate] Diarrhea  . Oxycodone-Acetaminophen Nausea And Vomiting    Social History   Social History  . Marital Status: Married    Spouse Name: N/A  . Number of Children: N/A  . Years of Education: N/A   Occupational History  . diabled    Social History Main Topics  . Smoking status: Never Smoker   . Smokeless tobacco: Never Used  . Alcohol Use: No  . Drug Use: No  . Sexual Activity: Not on file   Other Topics Concern  . Not on file   Social History Narrative    Family History  Problem Relation Age of Onset  . Diabetes Mother   . Diabetes Father   . Heart disease Mother     BP 135/73 mmHg  Pulse 69  Ht 5\' 4"  (1.626 m)  Wt 233 lb (105.688 kg)  BMI 39.97 kg/m2  LMP 05/20/2012  Review of Systems: See HPI above.    Objective:  Physical Exam:  Gen: NAD  Left  shoulder: No swelling, ecchymoses.  No gross deformity. No TTP. FROM without painful arc. Minimal positive Hawkins, negative Neers. Negative Speeds, Yergasons. Strength 5/5 with empty can and 5/5 resisted internal/external rotation. Negative apprehension. NV intact distally.  Right shoulder: FROM without pain.    Assessment & Plan:  1. Left shoulder injury - consistent with rotator cuff strain.  Radiographs negative for fracture.  Doing well with home exercises now.  Still with occasional pain with a lot of activity and weather change.  Will push out next follow-up appointment to 3 months from now.  Tylenol/nsaids as needed.  Continue full duty.

## 2016-03-22 ENCOUNTER — Telehealth: Payer: Self-pay | Admitting: Internal Medicine

## 2016-03-22 NOTE — Telephone Encounter (Signed)
  [place]  Let [name] know that  A) no PE - good news. No cancer - also good news  B) there is LVH on hear t - so order echo  C) gall bladder mildly distended -> is she havin right shoulder or RUQ pain?    Ct Angio Chest W/cm &/or Wo Cm  03/21/2016  CLINICAL DATA:  Shortness of breath and intermittent chest pain EXAM: CT ANGIOGRAPHY CHEST WITH CONTRAST TECHNIQUE: Multidetector CT imaging of the chest was performed using the standard protocol during bolus administration of intravenous contrast. Multiplanar CT image reconstructions and MIPs were obtained to evaluate the vascular anatomy. CONTRAST:  80 mL Isovue 370 nonionic COMPARISON:  Chest radiograph February 21, 2016 FINDINGS: Mediastinum/Lymph Nodes: There is no demonstrable pulmonary embolus. There is no thoracic aortic aneurysm or dissection. Visualized great vessels appear normal. Pericardium is not appreciably thickened. There is mild left ventricular hypertrophy. Visualized thyroid appears unremarkable. There is no appreciable thoracic adenopathy. Lungs/Pleura: There is no parenchymal lung edema or consolidation. No pleural effusion. Upper abdomen: Visualized upper abdominal structures appear unremarkable except for what appears to be mild distention of the gallbladder, incompletely visualized. Musculoskeletal: There are no blastic or lytic bone lesions. Review of the MIP images confirms the above findings. IMPRESSION: No demonstrable pulmonary embolus. No parenchymal lung edema or consolidation. No adenopathy. There is left ventricular hypertrophy. Pericardium is not thickened. Incomplete visualization of the gallbladder. Gallbladder does appear mildly distended. Significance of this finding is uncertain. Electronically Signed   By: Lowella Grip III M.D.   On: 03/21/2016 10:35

## 2016-03-22 NOTE — Telephone Encounter (Signed)
lmtcb x1 for pt. 

## 2016-03-23 ENCOUNTER — Ambulatory Visit: Payer: Federal, State, Local not specified - PPO

## 2016-03-24 ENCOUNTER — Telehealth: Payer: Self-pay | Admitting: *Deleted

## 2016-03-24 MED ORDER — POTASSIUM CHLORIDE CRYS ER 20 MEQ PO TBCR: 20.0000 meq | EXTENDED_RELEASE_TABLET | Freq: Every day | ORAL | Status: DC

## 2016-03-24 NOTE — Telephone Encounter (Signed)
Faxed rx requested received from pharmacy. Medication filled to pharmacy as requested.

## 2016-03-27 ENCOUNTER — Institutional Professional Consult (permissible substitution): Payer: Self-pay | Admitting: Internal Medicine

## 2016-03-27 ENCOUNTER — Ambulatory Visit: Payer: Federal, State, Local not specified - PPO | Admitting: Physical Therapy

## 2016-03-28 ENCOUNTER — Ambulatory Visit (INDEPENDENT_AMBULATORY_CARE_PROVIDER_SITE_OTHER): Payer: Federal, State, Local not specified - PPO | Admitting: Adult Health

## 2016-03-28 ENCOUNTER — Ambulatory Visit: Payer: Self-pay | Admitting: Adult Health

## 2016-03-28 ENCOUNTER — Encounter: Payer: Self-pay | Admitting: Adult Health

## 2016-03-28 ENCOUNTER — Other Ambulatory Visit: Payer: Self-pay | Admitting: Adult Health

## 2016-03-28 ENCOUNTER — Other Ambulatory Visit: Payer: Self-pay | Admitting: Family Medicine

## 2016-03-28 ENCOUNTER — Ambulatory Visit: Payer: Federal, State, Local not specified - PPO | Admitting: Physical Therapy

## 2016-03-28 ENCOUNTER — Telehealth: Payer: Self-pay | Admitting: Family Medicine

## 2016-03-28 VITALS — BP 98/70 | HR 89 | Temp 98.0°F | Ht 64.0 in | Wt 228.0 lb

## 2016-03-28 DIAGNOSIS — J4541 Moderate persistent asthma with (acute) exacerbation: Secondary | ICD-10-CM | POA: Diagnosis not present

## 2016-03-28 DIAGNOSIS — R0600 Dyspnea, unspecified: Secondary | ICD-10-CM | POA: Diagnosis not present

## 2016-03-28 DIAGNOSIS — R9389 Abnormal findings on diagnostic imaging of other specified body structures: Secondary | ICD-10-CM

## 2016-03-28 DIAGNOSIS — R932 Abnormal findings on diagnostic imaging of liver and biliary tract: Secondary | ICD-10-CM

## 2016-03-28 DIAGNOSIS — R938 Abnormal findings on diagnostic imaging of other specified body structures: Secondary | ICD-10-CM | POA: Diagnosis not present

## 2016-03-28 MED ORDER — AEROCHAMBER MV MISC
Status: DC
Start: 2016-03-28 — End: 2018-12-11

## 2016-03-28 NOTE — Addendum Note (Signed)
Addended by: Osa Craver on: 03/28/2016 02:55 PM   Modules accepted: Orders

## 2016-03-28 NOTE — Patient Instructions (Addendum)
Continue on Dulera 2 puffs daily, rinse after use We will set you up for an ultraound of your heart - 2 D echo .  Will make appointment with your primary care doctor for gallbladder abnormality on CT scan .  Follow-up with Dr. Chase Caller in 6 -8 weeks and as needed Please contact office for sooner follow up if symptoms do not improve or worsen or seek emergency care

## 2016-03-28 NOTE — Telephone Encounter (Signed)
We will schedule an [place] abd to look at [facility]

## 2016-03-28 NOTE — Assessment & Plan Note (Signed)
Ongoing Dyspnea.  Check 2 D echo .

## 2016-03-28 NOTE — Assessment & Plan Note (Signed)
Mild gallbladder distention on CT  Refer to PCP for further evauation

## 2016-03-28 NOTE — Telephone Encounter (Signed)
I received a call from Rexene Edison, NP office - she saw pt today and wanted pt to f/u with PCP for DX of gallbladder abnormality found in a CT (that is in [facility]). They didn't know if Dr. Etter Sjogren would f/u or refer pt elsewhere. Scheduled pt for 03/30/16 11:00am (30 min appt). Please let me know if she needs to be rescheduled.

## 2016-03-28 NOTE — Assessment & Plan Note (Signed)
Recent exacerbation now resolving  Cont on dulera

## 2016-03-28 NOTE — Progress Notes (Signed)
   Subjective:    Patient ID: Rhonda Morrow, female    DOB: [birth year], 55 y.o.   MRN: [id number]  HPI 55 year old female never smoker with known asthma Patient is deaf and mute  Test PFt date 01/24/12 shows mixed obstruction - restriction but greater restriction with low dlco  - fev1 1/48L/58%, Ratio 82, TLC 56, DLCO 14/51%, No BD response  CT chest 02/09/2012 showed no evidence of scarring   03/28/2016 follow-up asthma Interpreter present for visit today.  Patient returns for a Two-week follow-up for asthma exacerbation Patient was treated with redness and taper and restarted on [facility] She was set up for a CT chest. This was negative for pulmonary embolism or acute process. There was noted. Left ventricular hypertrophy. Discussed setting up a 2-D echo to evaluate this further there was also notable, gallbladder mildly distention. Have advised her that she needs to follow with her primary care physician regarding this. She remains on Dulera twice daily. Says overall she says cough and wheezing are better but still sob Has stomach complaints of bloating.  Denies chest pain, orthopnea, edema or fever.    Review of Systems Constitutional:   No  weight loss, night sweats,  Fevers, chills,  + fatigue, or  lassitude.  HEENT:   No headaches,  Difficulty swallowing,  Tooth/dental problems, or  Sore throat,                No sneezing, itching, ear ache,  +nasal congestion, post nasal drip,   CV:  No chest pain,  Orthopnea, PND, swelling in lower extremities, anasarca, dizziness, palpitations, syncope.   GI  No heartburn, indigestion, abdominal pain, nausea, vomiting, diarrhea, change in bowel habits, loss of appetite, bloody stools.   Resp: .  Marland Kitchen  No chest wall deformity  Skin: no rash or lesions.  GU: no dysuria, change in color of urine, no urgency or frequency.  No flank pain, no hematuria   MS:  No joint pain or swelling.  No decreased range of motion.  No back pain.  Psych:   No change in mood or affect. No depression or anxiety.  No memory loss.         Objective:   Physical Exam  Filed Vitals:   03/28/16 1416  BP: 98/70  Pulse: 89  Temp: 98 F (36.7 C)  TempSrc: Oral  Height: 5\' 4"  (1.626 m)  Weight: 228 lb (103.42 kg)  SpO2: 97%    GEN: A/Ox3; pleasant , NAD, obese  Deaf/mute with interpretor   HEENT:  [place]/AT,  EACs-clear, TMs-wnl, NOSE-clear, THROAT-clear, no lesions, no postnasal drip or exudate noted.   NECK:  Supple w/ fair ROM; no JVD; normal carotid impulses w/o bruits; no thyromegaly or nodules palpated; no lymphadenopathy.  RESP  Clear  P & A; w/o, wheezes/ rales/ or rhonchi.no accessory muscle use, no dullness to percussion  CARD:  RRR, no m/r/g  , no peripheral edema, pulses intact, no cyanosis or clubbing.  GI:   Soft & nt; nml bowel sounds; no organomegaly or masses detected.  Musco: Warm bil, no deformities or joint swelling noted.   Neuro: alert, no focal deficits noted.    Skin: Warm, no lesions or rashes  Nevae Pinnix NP-C  [facility]  03/28/2016        Assessment & Plan:

## 2016-03-30 ENCOUNTER — Encounter: Payer: Self-pay | Admitting: Family Medicine

## 2016-03-30 ENCOUNTER — Ambulatory Visit (HOSPITAL_BASED_OUTPATIENT_CLINIC_OR_DEPARTMENT_OTHER)
Admission: RE | Admit: 2016-03-30 | Discharge: 2016-03-30 | Disposition: A | Discharge status: Home / Self Care | Payer: Federal, State, Local not specified - PPO | Source: Ambulatory Visit | Attending: Family Medicine | Admitting: Family Medicine

## 2016-03-30 ENCOUNTER — Ambulatory Visit (INDEPENDENT_AMBULATORY_CARE_PROVIDER_SITE_OTHER): Payer: Federal, State, Local not specified - PPO | Admitting: Family Medicine

## 2016-03-30 ENCOUNTER — Ambulatory Visit: Payer: Federal, State, Local not specified - PPO

## 2016-03-30 VITALS — BP 100/70 | HR 76 | Temp 98.3°F | Ht 62.0 in | Wt 232.6 lb

## 2016-03-30 DIAGNOSIS — E1151 Type 2 diabetes mellitus with diabetic peripheral angiopathy without gangrene: Secondary | ICD-10-CM | POA: Diagnosis not present

## 2016-03-30 DIAGNOSIS — K8020 Calculus of gallbladder without cholecystitis without obstruction: Secondary | ICD-10-CM | POA: Insufficient documentation

## 2016-03-30 DIAGNOSIS — R932 Abnormal findings on diagnostic imaging of liver and biliary tract: Secondary | ICD-10-CM

## 2016-03-30 DIAGNOSIS — K219 Gastro-esophageal reflux disease without esophagitis: Secondary | ICD-10-CM

## 2016-03-30 DIAGNOSIS — R1011 Right upper quadrant pain: Secondary | ICD-10-CM

## 2016-03-30 DIAGNOSIS — E1165 Type 2 diabetes mellitus with hyperglycemia: Secondary | ICD-10-CM

## 2016-03-30 DIAGNOSIS — K760 Fatty (change of) liver, not elsewhere classified: Secondary | ICD-10-CM | POA: Insufficient documentation

## 2016-03-30 DIAGNOSIS — K828 Other specified diseases of gallbladder: Secondary | ICD-10-CM | POA: Diagnosis not present

## 2016-03-30 DIAGNOSIS — IMO0002 Reserved for concepts with insufficient information to code with codable children: Secondary | ICD-10-CM

## 2016-03-30 LAB — COMPREHENSIVE METABOLIC PANEL
ALK PHOS: 65 U/L (ref 39–117)
ALT: 34 U/L (ref 0–35)
AST: 25 U/L (ref 0–37)
Albumin: 4 g/dL (ref 3.5–5.2)
BUN: 18 mg/dL (ref 6–23)
CO2: 30 mEq/L (ref 19–32)
Calcium: 9.9 mg/dL (ref 8.4–10.5)
Chloride: 102 mEq/L (ref 96–112)
Creatinine, Ser: 1.05 mg/dL (ref 0.40–1.20)
GFR: 70.63 mL/min (ref >60.00)
GLUCOSE: 90 mg/dL (ref 70–99)
POTASSIUM: 3.4 meq/L — AB (ref 3.5–5.1)
SODIUM: 139 meq/L (ref 135–145)
TOTAL PROTEIN: 7.2 g/dL (ref 6.0–8.3)
Total Bilirubin: 0.6 mg/dL (ref 0.2–1.2)

## 2016-03-30 LAB — CBC WITH DIFFERENTIAL/PLATELET
BASOS PCT: 0.6 % (ref 0.0–3.0)
Basophils Absolute: 0 10*3/uL (ref 0.0–0.1)
EOS PCT: 2.2 % (ref 0.0–5.0)
Eosinophils Absolute: 0.2 10*3/uL (ref 0.0–0.7)
HEMATOCRIT: 41.4 % (ref 36.0–46.0)
HEMOGLOBIN: 13.5 g/dL (ref 12.0–15.0)
LYMPHS ABS: 2 10*3/uL (ref 0.7–4.0)
Lymphocytes Relative: 28.7 % (ref 12.0–46.0)
MCHC: 32.6 g/dL (ref 30.0–36.0)
MCV: 82.3 fl (ref 78.0–100.0)
MONO ABS: 0.7 10*3/uL (ref 0.1–1.0)
Monocytes Relative: 10.6 % (ref 3.0–12.0)
Neutro Abs: 4 10*3/uL (ref 1.4–7.7)
Neutrophils Relative %: 57.9 % (ref 43.0–77.0)
Platelets: 227 10*3/uL (ref 150.0–400.0)
RBC: 5.03 Mil/uL (ref 3.87–5.11)
RDW: 14.7 % (ref 11.5–15.5)
WBC: 6.9 10*3/uL (ref 4.0–10.5)

## 2016-03-30 LAB — AMYLASE: Amylase: 30 U/L (ref 27–131)

## 2016-03-30 LAB — LIPASE: LIPASE: 3 U/L — AB (ref 11.0–59.0)

## 2016-03-30 LAB — H. PYLORI ANTIBODY, IGG: H Pylori IgG: NEGATIVE

## 2016-03-30 MED ORDER — GI COCKTAIL ~~LOC~~
30.0000 mL | Freq: Once | ORAL | Status: AC
  Administered 2016-03-30: 30 mL via ORAL

## 2016-03-30 MED ORDER — OMEPRAZOLE 20 MG PO CPDR: 20.0000 mg | DELAYED_RELEASE_CAPSULE | Freq: Every day | ORAL | Status: DC

## 2016-03-30 MED ORDER — METFORMIN HCL ER 500 MG PO TB24: ORAL_TABLET | ORAL | Status: DC

## 2016-03-30 NOTE — Progress Notes (Signed)
Pre visit review using our clinic review tool, if applicable. No additional management support is needed unless otherwise documented below in the visit note. 

## 2016-03-30 NOTE — Patient Instructions (Signed)
Food Choices for Gastroesophageal Reflux Disease, Adult When you have gastroesophageal reflux disease (GERD), the foods you eat and your eating habits are very important. Choosing the right foods can help ease the discomfort of GERD. WHAT GENERAL GUIDELINES DO I NEED TO FOLLOW?  Choose fruits, vegetables, whole grains, low-fat dairy products, and low-fat meat, fish, and poultry.  Limit fats such as oils, salad dressings, butter, nuts, and avocado.  Keep a food diary to identify foods that cause symptoms.  Avoid foods that cause reflux. These may be different for different people.  Eat frequent small meals instead of three large meals each day.  Eat your meals slowly, in a relaxed setting.  Limit fried foods.  Cook foods using methods other than frying.  Avoid drinking alcohol.  Avoid drinking large amounts of liquids with your meals.  Avoid bending over or lying down until 2-3 hours after eating. WHAT FOODS ARE NOT RECOMMENDED? The following are some foods and drinks that may worsen your symptoms: Vegetables Tomatoes. Tomato juice. Tomato and spaghetti sauce. Chili peppers. Onion and garlic. Horseradish. Fruits Oranges, grapefruit, and lemon (fruit and juice). Meats High-fat meats, fish, and poultry. This includes hot dogs, ribs, ham, sausage, salami, and bacon. Dairy Whole milk and chocolate milk. Sour cream. Cream. Butter. Ice cream. Cream cheese.  Beverages Coffee and tea, with or without caffeine. Carbonated beverages or energy drinks. Condiments Hot sauce. Barbecue sauce.  Sweets/Desserts Chocolate and cocoa. Donuts. Peppermint and spearmint. Fats and Oils High-fat foods, including French fries and potato chips. Other Vinegar. Strong spices, such as black pepper, white pepper, red pepper, cayenne, curry powder, cloves, ginger, and chili powder. The items listed above may not be a complete list of foods and beverages to avoid. Contact your dietitian for more  information.   This information is not intended to replace advice given to you by your health care provider. Make sure you discuss any questions you have with your health care provider.   Document Released: 10/23/2005 Document Revised: 11/13/2014 Document Reviewed: 08/27/2013 Elsevier Interactive Patient Education 2016 [employer]  

## 2016-03-31 ENCOUNTER — Other Ambulatory Visit: Payer: Self-pay | Admitting: Family Medicine

## 2016-03-31 DIAGNOSIS — R1011 Right upper quadrant pain: Secondary | ICD-10-CM

## 2016-04-04 ENCOUNTER — Ambulatory Visit: Payer: Federal, State, Local not specified - PPO

## 2016-04-06 DIAGNOSIS — K8020 Calculus of gallbladder without cholecystitis without obstruction: Secondary | ICD-10-CM | POA: Diagnosis not present

## 2016-04-09 DIAGNOSIS — R1011 Right upper quadrant pain: Secondary | ICD-10-CM | POA: Insufficient documentation

## 2016-04-09 NOTE — Progress Notes (Signed)
Patient ID: Rhonda Morrow, female    DOB: 10-12-1961  Age: 55 y.o. MRN: [id number]    Subjective:  Subjective HPI Rhonda Morrow presents for abd pain.  Imaging done by pulm and abn seen in [facility].  No other complaints.   Review of Systems  Constitutional: Negative for diaphoresis, appetite change, fatigue and unexpected weight change.  Eyes: Negative for pain, redness and visual disturbance.  Respiratory: Negative for cough, chest tightness, shortness of breath and wheezing.   Cardiovascular: Negative for chest pain, palpitations and leg swelling.  Gastrointestinal: Positive for abdominal pain. Negative for nausea, diarrhea, constipation and blood in stool.  Endocrine: Negative for cold intolerance, heat intolerance, polydipsia, polyphagia and polyuria.  Genitourinary: Negative for dysuria, frequency and difficulty urinating.  Neurological: Negative for dizziness, light-headedness, numbness and headaches.    History Past Medical History  Diagnosis Date  . Hypertension   . Deaf   . Asthma   . Thyroid disease   . Diabetes mellitus without complication [facility])     She has past surgical history that includes Cesarean section; Cardiac catheterization; and Esophageal manometry (N/A, 09/29/2013).   Her family history includes Diabetes in her father and mother; Heart disease in her mother.She reports that she has never smoked. She has never used smokeless tobacco. She reports that she does not drink alcohol or use illicit drugs.  Current Outpatient Prescriptions on File Prior to Visit  Medication Sig Dispense Refill  . albuterol (PROVENTIL HFA;VENTOLIN HFA) 108 (90 Base) MCG/ACT inhaler Inhale 2 puffs into the lungs every 6 (six) hours as needed for wheezing. 1 Inhaler 3  . amLODipine (NORVASC) 5 MG tablet TAKE 1 TABLET (5 MG TOTAL) BY MOUTH DAILY. 30 tablet 6  . cetirizine (ZYRTEC) 10 MG tablet Take 1 tablet (10 mg total) by mouth daily. 30 tablet 11  . cyclobenzaprine (FLEXERIL) 5 MG tablet Take  1 tablet (5 mg total) by mouth at bedtime. 7 tablet 0  . diclofenac (VOLTAREN) 75 MG EC tablet Take 1 tablet (75 mg total) by mouth 2 (two) times daily. 60 tablet 1  . Fiber CHEW Chew 1 tablet by mouth daily.    . fluticasone (FLONASE) 50 MCG/ACT nasal spray PLACE 2 SPRAYS INTO BOTH NOSTRILS DAILY. 16 g 4  . glucose blood test strip Use as instructed--- one touch verio test strips 100 each 12  . hydrochlorothiazide (HYDRODIURIL) 12.5 MG tablet Take 1 tablet (12.5 mg total) by mouth daily. 90 tablet 1  . ipratropium-albuterol (DUONEB) 0.5-2.5 (3) MG/3ML SOLN Take 3 mLs by nebulization every 6 (six) hours as needed. 360 mL 3  . meclizine (ANTIVERT) 25 MG tablet Take 1 tablet (25 mg total) by mouth 3 (three) times daily as needed. 45 tablet 3  . methocarbamol (ROBAXIN) 500 MG tablet Take 1 tablet (500 mg total) by mouth 2 (two) times daily. 20 tablet 0  . mometasone-formoterol (DULERA) 100-5 MCG/ACT AERO Inhale 2 puffs into the lungs 2 (two) times daily. 8.8 g 3  . Multiple Vitamin (MULTIVITAMIN) tablet Take 1 tablet by mouth daily.    . nitroGLYCERIN (NITRODUR - DOSED IN MG/24 HR) 0.2 mg/hr patch Apply 1/4th patch to affected shoulder, change daily 30 patch 1  . NONFORMULARY OR COMPOUNDED ITEM Nebulizer   DX ASTHMA 1 each 0  . potassium chloride SA (K-DUR,KLOR-CON) 20 MEQ tablet Take 1 tablet (20 mEq total) by mouth daily. 90 tablet 1  . Spacer/Aero-Holding Chambers (AEROCHAMBER MV) inhaler Use as instructed 1 each 0  . traMADol (ULTRAM) 50  MG tablet Take 1 tablet (50 mg total) by mouth every 8 (eight) hours as needed. 30 tablet 3   No current facility-administered medications on file prior to visit.     Objective:  Objective Physical Exam  Constitutional: She is oriented to person, place, and time. She appears well-developed and well-nourished.  HENT:  Head: Normocephalic and atraumatic.  Eyes: Conjunctivae and EOM are normal.  Neck: Normal range of motion. Neck supple. No JVD present.  Carotid bruit is not present. No thyromegaly present.  Cardiovascular: Normal rate, regular rhythm and normal heart sounds.   No murmur heard. Pulmonary/Chest: Effort normal and breath sounds normal. No respiratory distress. She has no wheezes. She has no rales. She exhibits no tenderness.  Abdominal: She exhibits no mass. There is tenderness in the right upper quadrant. There is guarding. There is no rebound.    Musculoskeletal: She exhibits no edema.  Neurological: She is alert and oriented to person, place, and time.  Psychiatric: She has a normal mood and affect.   BP 100/70 mmHg  Pulse 76  Temp(Src) 98.3 F (36.8 C) (Oral)  Ht 5\' 2"  (1.575 m)  Wt 232 lb 9.6 oz (105.507 kg)  BMI 42.53 kg/m2  SpO2 99%  LMP 05/20/2012 Wt Readings from Last 3 Encounters:  03/30/16 232 lb 9.6 oz (105.507 kg)  03/28/16 228 lb (103.42 kg)  03/20/16 233 lb (105.688 kg)     Lab Results  Component Value Date   WBC 6.9 03/30/2016   HGB 13.5 03/30/2016   HCT 41.4 03/30/2016   PLT 227.0 03/30/2016   GLUCOSE 90 03/30/2016   CHOL 151 02/08/2016   TRIG 84.0 02/08/2016   HDL 53.00 02/08/2016   LDLCALC 81 02/08/2016   ALT 34 03/30/2016   AST 25 03/30/2016   NA 139 03/30/2016   K 3.4* 03/30/2016   CL 102 03/30/2016   CREATININE 1.05 03/30/2016   BUN 18 03/30/2016   CO2 30 03/30/2016   TSH 1.41 07/01/2015   INR 1.00 12/30/2012   HGBA1C 6.8* 02/08/2016   MICROALBUR 0.8 10/27/2015    US Abdomen Complete  03/30/2016  CLINICAL DATA:  Distended gallbladder on CT, epigastric pain for 3 month EXAM: ABDOMEN ULTRASOUND COMPLETE COMPARISON:  CT chest of 03/21/2016 FINDINGS: Gallbladder: The gallbladder is slightly distended and there are several gallstones with to mobile within the gallbladder. The largest gallstone measures 1.7 cm and there is acoustical shadowing present. There is no pain over the gallbladder with compression. Common bile duct: Diameter: The common bile duct is normal measuring 3 mm in  diameter. Liver: The liver somewhat echogenic suggesting fatty infiltration. No focal hepatic abnormality is seen. Correlation with LFTs is recommended. IVC: The IVC is partially obscured by bowel gas. Pancreas: Much of the pancreas also is obscured by bowel gas. Spleen: The spleen is normal measuring 7 cm. Right Kidney: Length: 10.5 cm.  No hydronephrosis is seen. Left Kidney: Length: 9.7 cm. No hydronephrosis is noted. The abdominal aorta is partially obscured by bowel gas and body habitus. Abdominal aorta: No aneurysm visualized. Other findings: None. IMPRESSION: 1. Multiple mobile gallstones with slight distention of the of the gallbladder. No pain is present over the gallbladder with compression currently. 2. Slightly increased echogenicity of the liver may indicate mild fatty infiltration. Correlate with LFTs. 3. Bowel gas and body habitus obscure portions of the pancreas and the abdominal aorta. Electronically Signed   By: Ivar Drape M.D.   On: 03/30/2016 16:53     Assessment &  Plan:  Plan I have discontinued Ms. Riemenschneider's famotidine and predniSONE. I am also having her start on omeprazole. Additionally, I am having her maintain her meclizine, multivitamin, Fiber, amLODipine, methocarbamol, nitroGLYCERIN, cyclobenzaprine, fluticasone, diclofenac, cetirizine, traMADol, hydrochlorothiazide, ipratropium-albuterol, glucose blood, NONFORMULARY OR COMPOUNDED ITEM, albuterol, mometasone-formoterol, potassium chloride SA, AEROCHAMBER MV, and metFORMIN. We administered gi cocktail.  Meds ordered this encounter  Medications  . omeprazole (PRILOSEC) 20 MG capsule    Sig: Take 1 capsule (20 mg total) by mouth daily.    Dispense:  30 capsule    Refill:  3  . metFORMIN (GLUCOPHAGE-XR) 500 MG 24 hr tablet    Sig: TAKE 1 TABLET (500 MG TOTAL) BY MOUTH DAILY WITH BREAKFAST.    Dispense:  90 tablet    Refill:  1  . gi cocktail (Maalox,Lidocaine,Donnatal)    Sig:     Problem List Items Addressed This Visit      None    Visit Diagnoses    Gastroesophageal reflux disease, esophagitis presence not specified    -  Primary    Relevant Medications    omeprazole (PRILOSEC) 20 MG capsule    gi cocktail (Maalox,Lidocaine,Donnatal) (Completed)    Other Relevant Orders    Comprehensive metabolic panel (Completed)    CBC with Differential/Platelet (Completed)    H. pylori antibody, IgG (Completed)    Amylase (Completed)    Lipase (Completed)    RUQ pain        Relevant Medications    gi cocktail (Maalox,Lidocaine,Donnatal) (Completed)    Other Relevant Orders    Comprehensive metabolic panel (Completed)    CBC with Differential/Platelet (Completed)    H. pylori antibody, IgG (Completed)    Amylase (Completed)    Lipase (Completed)    DM (diabetes mellitus) type II uncontrolled, periph vascular disorder (HCC)        Relevant Medications    metFORMIN (GLUCOPHAGE-XR) 500 MG 24 hr tablet       Follow-up: Return if symptoms worsen or fail to improve.  Ann Held, DO

## 2016-04-09 NOTE — Assessment & Plan Note (Signed)
[place] pending Suspect [facility]

## 2016-04-11 ENCOUNTER — Ambulatory Visit: Payer: Federal, State, Local not specified - PPO | Admitting: Physical Therapy

## 2016-04-12 ENCOUNTER — Ambulatory Visit (HOSPITAL_COMMUNITY): Payer: Federal, State, Local not specified - PPO | Attending: Cardiology

## 2016-04-12 ENCOUNTER — Other Ambulatory Visit: Payer: Self-pay

## 2016-04-12 DIAGNOSIS — Z6841 Body Mass Index (BMI) 40.0 and over, adult: Secondary | ICD-10-CM | POA: Insufficient documentation

## 2016-04-12 DIAGNOSIS — E669 Obesity, unspecified: Secondary | ICD-10-CM | POA: Diagnosis not present

## 2016-04-12 DIAGNOSIS — E119 Type 2 diabetes mellitus without complications: Secondary | ICD-10-CM | POA: Diagnosis not present

## 2016-04-12 DIAGNOSIS — I10 Essential (primary) hypertension: Secondary | ICD-10-CM | POA: Diagnosis not present

## 2016-04-12 DIAGNOSIS — R0600 Dyspnea, unspecified: Secondary | ICD-10-CM | POA: Insufficient documentation

## 2016-04-12 LAB — ECHOCARDIOGRAM COMPLETE
E decel time: 180 msec
E/e' ratio: 9.45
FS: 38 % (ref 28–44)
IV/PV OW: 1.21
LA ID, A-P, ES: 35 mm
LA vol A4C: 44 ml
LA vol: 42 mL
LADIAMINDEX: 1.58 cm/m2
LAVOLIN: 19 mL/m2
LEFT ATRIUM END SYS DIAM: 35 mm
LV PW d: 8.74 mm — AB (ref 0.6–1.1)
LV TDI E'MEDIAL: 9.56
LV e' LATERAL: 9.4 cm/s
LVEEAVG: 9.45
LVEEMED: 9.45
LVOT VTI: 17.6 cm
LVOT area: 2.27 cm2
LVOT peak grad rest: 3 mmHg
LVOTD: 17 mm
LVOTPV: 88 cm/s
LVOTSV: 40 mL
MV Dec: 180
MVPG: 3 mmHg
MVPKAVEL: 102 m/s
MVPKEVEL: 88.8 m/s
TDI e' lateral: 9.4

## 2016-04-13 ENCOUNTER — Telehealth: Payer: Self-pay | Admitting: *Deleted

## 2016-04-13 NOTE — Telephone Encounter (Signed)
Requesting a note of cardiac clearance for upcoming surgery. Forwarded to Dr. Carollee Herter. JG//CMA

## 2016-04-17 ENCOUNTER — Telehealth: Payer: Self-pay | Admitting: Pulmonary Disease

## 2016-04-17 NOTE — Telephone Encounter (Signed)
Spoke with pt via interpreter and she states that she is needing information sent to her GI surgeon. Pt reports name as Dr. Velvet Bathe (?) but I can't find that name anywhere. Per chart she was referred to [facility]. Pt is not sure if they need surgical clearance or records. Office is already closed for the day. Will hold in triage for call to surgeon's office tomorrow to find out what they need from [place].

## 2016-04-18 ENCOUNTER — Telehealth: Payer: Self-pay | Admitting: Pulmonary Disease

## 2016-04-18 ENCOUNTER — Ambulatory Visit: Payer: Federal, State, Local not specified - PPO | Attending: Family Medicine | Admitting: Physical Therapy

## 2016-04-18 DIAGNOSIS — M25652 Stiffness of left hip, not elsewhere classified: Secondary | ICD-10-CM

## 2016-04-18 DIAGNOSIS — M25552 Pain in left hip: Secondary | ICD-10-CM | POA: Diagnosis not present

## 2016-04-18 DIAGNOSIS — R262 Difficulty in walking, not elsewhere classified: Secondary | ICD-10-CM

## 2016-04-18 DIAGNOSIS — R2689 Other abnormalities of gait and mobility: Secondary | ICD-10-CM | POA: Diagnosis not present

## 2016-04-18 NOTE — Therapy (Addendum)
[facility] [address]  [place], [place], [address] Phone: [phone]   Fax:  [phone]  Physical Therapy Treatment  Patient Details  Name: Rhonda Morrow MRN: [id number] Date of Birth: 1960/11/25 Referring Provider: Karlton Lemon, MD  Encounter Date: 04/18/2016      PT End of Session - 04/18/16 1625    Visit Number 9   Number of Visits 14   Date for PT Re-Evaluation 05/01/16   PT Start Time 1625   PT Stop Time 5852   PT Time Calculation (min) 50 min   Activity Tolerance Patient tolerated treatment well   Behavior During Therapy [facility] for tasks assessed/performed      Past Medical History  Diagnosis Date  . Hypertension   . Deaf   . Asthma   . Thyroid disease   . Diabetes mellitus without complication [facility])     Past Surgical History  Procedure Laterality Date  . Cesarean section    . Cardiac catheterization    . Esophageal manometry N/A 09/29/2013    Procedure: ESOPHAGEAL MANOMETRY (EM);  Surgeon: Milus Banister, MD;  Location: [facility];  Service: Endoscopy;  Laterality: N/A;    There were no vitals filed for this visit.      Subjective Assessment - 04/18/16 1632    Subjective Via sign languate interpreter, pt. states she has been unable to come to therapy for the past month due to illness, car torouble and family issues. Currently having abdominal pain from gallbladder and will need surgery for this soon. States hip pain has been variable, but has only been able to keep up with HEP intermittently. Pain still very much associated with weather changes.   Patient is accompained by: Interpreter   How long can you stand comfortably? 30 minutes   Patient Stated Goals "Be able to go up stairs and walk without pain."   Currently in Pain? Yes   Pain Score --  3-4/10   Pain Location Hip   Pain Orientation Left;Anterior            [name] PT Assessment - 04/18/16 1625    Assessment   Medical  Diagnosis L hip flexor strain/arthritis   Referring Provider Karlton Lemon, MD   Onset Date/Surgical Date --  Feb 2017   Next MD Visit 06/20/16   Strength   Strength Assessment Site Hip   Right Hip Flexion 4/5   Right Hip Extension 4-/5   Right Hip ABduction 4/5   Right Hip ADduction 4/5   Left Hip Flexion 4-/5  pain with resistance   Left Hip Extension 3+/5  pain with resistance   Left Hip ABduction 4-/5   Left Hip ADduction 4-/5           Today's Treatment  TherEx NuStep - level 4 x 4'  MMT/Flexibility assessment  Stair training targeting reciprocal ascent and descent  TherEx Bridge 10x5" Seated Hip Adduction pillow squeeze 15x3" Seated Alternating Hip ABD/ER clam with blue TB 15x3" B Standing 3 way Hip SLR (flexion, abduction, extension) with looped red TB x10 each          PT Education - 04/18/16 1814    Education provided Yes   Education Details Updated HEP with seated/standing exercises due to limited tolerance for supine at present   Person(s) Educated Patient   Methods Explanation;Demonstration;Handout;Verbal cues;Tactile cues   Comprehension Verbalized understanding;Returned demonstration;Verbal cues required;Tactile cues required;Need further instruction  PT Long Term Goals - 04/18/16 1648    PT LONG TERM GOAL #1   Title Pt will be independent with HEP by 05/01/16   Status Partially Met  HEP modified to sitting and standing position today due to abdominal discomfort from gallbladder when laying down   PT LONG TERM GOAL #2   Title Pt will demonstrate bilateral hip strength 4/5 or greater without increased pain by 05/01/16   Status Partially Met  Met for R hip except extension 4-/5, L hip remains 3+/5 to 4-/5   PT LONG TERM GOAL #3   Title Pt will report ability to stand long enough to cook dinner or wash dishes without increased L hip pain by 05/01/16   Status Partially Met  Pt. reports she is able to stand for up to 30 minutes  but still feels limited while cooking dinner and washing dishes and still feels like she has to work quickly or she starts to feel L hip pain before she is done.   PT LONG TERM GOAL #4   Title Pt will ascend/descend stairs reciprocally with normal step pattern without increased L hip pain by 05/01/16   Status Partially Met  Pt. able to ascend & descend stairs with reciprocal pattern if she moves slowly, but states she reverts to step-to pattern at home.               Plan - 04/18/16 1738    Clinical Impression Statement Pt returning to PT after 4 week absence due to multiple issues including illness, gallbladder issues which will require surgery, and car trouble. L hip pain currently 3-4/10. Pt has shown some slight continued improvement in B hip strength but reports supine exercises are currently difficult for her due to abdominal discomfort from gallbladder, therefore modified HEP to seated and standing exercises. Pt able to ascend/descend stairs reciprocally with good step pattern, but reports favoring step-to pattern at home; pt encouraged to regularly approach stairs with recpirocal pattern. Pt has no further appts scheduled at present and when suggested that she schedule more while here, pt opted to wait until she knows more about her gallbladder surgery.    Clinical Impairments Affecting Rehab Potential Pt guarding movement; Obesity; Language barrier   PT Treatment/Interventions Patient/family education;Therapeutic exercise;Manual techniques;Passive range of motion;Taping;Dry needling;Therapeutic activities;Gait training;Stair training;Neuromuscular re-education;Ultrasound;Moist Heat;Electrical Stimulation;Cryotherapy;Iontophoresis 30m/ml Dexamethasone   PT Next Visit Plan Continue with hip / knee strengthening activity per pt. tolerance.     Consulted and Agree with Plan of Care Patient      Patient will benefit from skilled therapeutic intervention in order to improve the following  deficits and impairments:  Pain, Impaired flexibility, Decreased range of motion, Decreased strength, Increased muscle spasms, Difficulty walking, Abnormal gait  Visit Diagnosis: Pain in left hip  Stiffness of left hip, not elsewhere classified  Difficulty in walking, not elsewhere classified  Other abnormalities of gait and mobility     Problem List Patient Active Problem List   Diagnosis Date Noted  . RUQ pain 04/09/2016  . Abnormal CT scan 03/28/2016  . Dyspnea and respiratory abnormalities 03/15/2016  . Asthma with acute exacerbation 03/15/2016  . Asthma in adult 02/25/2016  . DM (diabetes mellitus) type II controlled, neurological manifestation ([place] 02/09/2016  . Left hip pain 12/23/2015  . Right hamstring muscle strain 11/18/2015  . Abdominal pain, acute 09/01/2015  . Acute asthma exacerbation 04/16/2015  . Acute bronchitis 04/14/2015  . Vaginal discharge 09/14/2014  . Pap smear for cervical cancer screening  09/14/2014  . Scabies 05/06/2014  . Bed bug bite 05/06/2014  . Allergic rhinitis 04/09/2014  . Rectal itching 04/09/2014  . Bladder spasm 04/09/2014  . Knee pain, left 03/05/2014  . Hypokalemia 02/19/2014  . Nausea with vomiting 02/19/2014  . Diabetes mellitus, type II ([place]) 02/19/2014  . Edema 02/16/2014  . Disorder of rotator cuff 11/12/2013  . Routine general medical examination at a health care facility 08/20/2013  . GERD (gastroesophageal reflux disease) 06/26/2013  . Dysphagia, pharyngoesophageal phase 06/26/2013  . Suprapubic pain 05/12/2013  . Medication side effect 03/25/2013  . Diarrhea 03/25/2013  . Benign positional vertigo 01/30/2013  . Traumatic hematoma of thigh 01/15/2013  . HTN (hypertension) 09/02/2012  . Dry skin 08/22/2012  . External hemorrhoids 08/22/2012  . Obesity 06/30/2012  . Thyromegaly 05/22/2012  . Back pain 05/22/2012  . Elevated glucose 05/22/2012  . Moderate persistent asthma 04/19/2012  . Dyspnea 01/28/2012     Percival Spanish, PT, MPT 04/18/2016, 6:17 PM  [facility] [address]  [place], [place], [address] Phone: [phone]   Fax:  [phone]  Name: Rhonda Morrow MRN: [id number] Date of Birth: 01-10-1961    PHYSICAL THERAPY DISCHARGE SUMMARY  Visits from Start of Care: 9  Current functional level related to goals / functional outcomes:   As of last PT visit on 04/18/16, pt had returned to PT after 4 week absence due to multiple issues including illness, gallbladder issues which will require surgery, and car trouble. L hip pain was currently 3-4/10. Pt had shown some slight continued improvement in B hip strength but reports supine exercises were currently difficult for her due to abdominal discomfort from gallbladder, therefore modified HEP to seated and standing exercises. Pt was able to ascend/descend stairs reciprocally with good step pattern, but reports favoring step-to pattern at home; pt encouraged to regularly approach stairs with recpirocal pattern. Goals were partially met. Pt had no further appts scheduled at present and when suggested that she schedule more while here, pt opted to wait until she knows more about her gallbladder surgery. No further contact received from pt in >30 days, therefore will proceed with discharge from PT.    Remaining deficits:   As above. Pt to continue with HEP.   Education / Equipment:   HEP  Plan: Patient agrees to discharge.  Patient goals were partially met. Patient is being discharged due to not returning since the last visit.  ?????       Percival Spanish, PT, MPT 05/22/2016, 9:54 AM  [facility] [address]  [place], [place], [address] Phone: [phone]   Fax:  [phone]

## 2016-04-18 NOTE — Progress Notes (Signed)
Quick Note:  LVM for pt to return call ______ 

## 2016-04-18 NOTE — Telephone Encounter (Signed)
Spoke with patient via relay operator-pt is aware of the following:  Beckie Busing, CMA at 04/18/2016 11:16 AM     Status: Signed       Expand All Collapse All   Per Sunday Spillers @ [facility] pt is seeing Dr. Donne Hazel. They have requested cardiac clearance and have received. She does not see anything in their notes about needing pulmonary clearance. She is going to send a message to Dr. Donne Hazel for clarification of anything further needed. If they need anything more they will call us.        Pt will contact [facility] to see if anything else is needed and go through their office. Nothing more needed at this time.

## 2016-04-18 NOTE — Telephone Encounter (Signed)
Per Sunday Spillers @ [facility] pt is seeing Dr. Donne Hazel. They have requested cardiac clearance and have received. She does not see anything in their notes about needing pulmonary clearance. She is going to send a message to Dr. Donne Hazel for clarification of anything further needed. If they need anything more they will call us.

## 2016-04-19 ENCOUNTER — Telehealth: Payer: Self-pay | Admitting: Internal Medicine

## 2016-04-19 NOTE — Progress Notes (Signed)
Quick Note:  LVM for pt to return call ______ 

## 2016-04-19 NOTE — Telephone Encounter (Signed)
Spoke with Sunday Spillers and confirmed. Nothing further needed.

## 2016-04-24 ENCOUNTER — Ambulatory Visit (INDEPENDENT_AMBULATORY_CARE_PROVIDER_SITE_OTHER): Payer: Federal, State, Local not specified - PPO | Admitting: Family Medicine

## 2016-04-24 ENCOUNTER — Encounter: Payer: Self-pay | Admitting: Family Medicine

## 2016-04-24 VITALS — BP 114/72 | HR 66 | Temp 98.8°F | Wt 229.8 lb

## 2016-04-24 DIAGNOSIS — E785 Hyperlipidemia, unspecified: Secondary | ICD-10-CM | POA: Diagnosis not present

## 2016-04-24 DIAGNOSIS — Z01810 Encounter for preprocedural cardiovascular examination: Secondary | ICD-10-CM

## 2016-04-24 DIAGNOSIS — E1151 Type 2 diabetes mellitus with diabetic peripheral angiopathy without gangrene: Secondary | ICD-10-CM | POA: Diagnosis not present

## 2016-04-24 LAB — TSH: TSH: 0.81 u[IU]/mL (ref 0.35–4.50)

## 2016-04-24 LAB — COMPREHENSIVE METABOLIC PANEL
ALT: 18 U/L (ref 0–35)
AST: 15 U/L (ref 0–37)
Albumin: 3.6 g/dL (ref 3.5–5.2)
Alkaline Phosphatase: 73 U/L (ref 39–117)
BUN: 15 mg/dL (ref 6–23)
CHLORIDE: 102 meq/L (ref 96–112)
CO2: 30 meq/L (ref 19–32)
CREATININE: 0.92 mg/dL (ref 0.40–1.20)
Calcium: 9.7 mg/dL (ref 8.4–10.5)
GFR: 82.24 mL/min (ref >60.00)
Glucose, Bld: 93 mg/dL (ref 70–99)
Potassium: 3.7 mEq/L (ref 3.5–5.1)
SODIUM: 140 meq/L (ref 135–145)
Total Bilirubin: 0.3 mg/dL (ref 0.2–1.2)
Total Protein: 7 g/dL (ref 6.0–8.3)

## 2016-04-24 LAB — CBC WITH DIFFERENTIAL/PLATELET
BASOS ABS: 0 10*3/uL (ref 0.0–0.1)
Basophils Relative: 0.5 % (ref 0.0–3.0)
EOS ABS: 0.1 10*3/uL (ref 0.0–0.7)
Eosinophils Relative: 1.4 % (ref 0.0–5.0)
HCT: 40.1 % (ref 36.0–46.0)
Hemoglobin: 13.2 g/dL (ref 12.0–15.0)
LYMPHS ABS: 2.4 10*3/uL (ref 0.7–4.0)
Lymphocytes Relative: 33.6 % (ref 12.0–46.0)
MCHC: 32.8 g/dL (ref 30.0–36.0)
MCV: 82.5 fl (ref 78.0–100.0)
MONO ABS: 0.7 10*3/uL (ref 0.1–1.0)
Monocytes Relative: 9.8 % (ref 3.0–12.0)
Neutro Abs: 3.9 10*3/uL (ref 1.4–7.7)
Neutrophils Relative %: 54.7 % (ref 43.0–77.0)
Platelets: 243 10*3/uL (ref 150.0–400.0)
RBC: 4.86 Mil/uL (ref 3.87–5.11)
RDW: 14.6 % (ref 11.5–15.5)
WBC: 7.1 10*3/uL (ref 4.0–10.5)

## 2016-04-24 LAB — LIPID PANEL
CHOL/HDL RATIO: 3
Cholesterol: 148 mg/dL (ref 0–200)
HDL: 52.2 mg/dL (ref >39.00)
LDL CALC: 79 mg/dL (ref 0–99)
NONHDL: 95.93
Triglycerides: 84 mg/dL (ref 0.0–149.0)
VLDL: 16.8 mg/dL (ref 0.0–40.0)

## 2016-04-24 LAB — HEMOGLOBIN A1C: HEMOGLOBIN A1C: 6.4 % (ref 4.6–6.5)

## 2016-04-24 NOTE — Progress Notes (Signed)
Quick Note:  Called and spoke with pt. Reivewed results and recs. Pt voiced understanding and had no further questions. ______

## 2016-04-24 NOTE — Progress Notes (Signed)
Subjective:    Rhonda Morrow is a 55 y.o. female who presents to the office today for a preoperative consultation at the request of surgeon Dr Donne Hazel  who plans on performing a lab chole on TBA -. This consultation is requested for the specific conditions prompting preoperative evaluation (i.e. because of potential affect on operative risk): dm, . Planned anesthesia: general. The patient has the following known anesthesia issues: no problems with personal / family hx. Patients bleeding risk: no recent abnormal bleeding. Patient does not have objections to receiving blood products if needed.  The following portions of the patient's history were reviewed and updated as appropriate:  She  has a past medical history of Hypertension; Deaf; Asthma; Thyroid disease; and Diabetes mellitus without complication ([employer]). She  does not have any pertinent problems on file. She  has past surgical history that includes Cesarean section; Cardiac catheterization; and Esophageal manometry (N/A, 09/29/2013). Her family history includes Diabetes in her father and mother; Heart disease in her mother. She  reports that she has never smoked. She has never used smokeless tobacco. She reports that she does not drink alcohol or use illicit drugs. She has a current medication list which includes the following prescription(s): albuterol, amlodipine, cetirizine, cyclobenzaprine, diclofenac, fiber, fluticasone, glucose blood, hydrochlorothiazide, ipratropium-albuterol, meclizine, metformin, methocarbamol, mometasone-formoterol, multivitamin, nitroglycerin, NONFORMULARY OR COMPOUNDED ITEM, omeprazole, potassium chloride sa, aerochamber mv, and tramadol. Current Outpatient Prescriptions on File Prior to Visit  Medication Sig Dispense Refill  . albuterol (PROVENTIL HFA;VENTOLIN HFA) 108 (90 Base) MCG/ACT inhaler Inhale 2 puffs into the lungs every 6 (six) hours as needed for wheezing. 1 Inhaler 3  . amLODipine (NORVASC) 5 MG tablet  TAKE 1 TABLET (5 MG TOTAL) BY MOUTH DAILY. 30 tablet 6  . cetirizine (ZYRTEC) 10 MG tablet Take 1 tablet (10 mg total) by mouth daily. 30 tablet 11  . cyclobenzaprine (FLEXERIL) 5 MG tablet Take 1 tablet (5 mg total) by mouth at bedtime. 7 tablet 0  . diclofenac (VOLTAREN) 75 MG EC tablet Take 1 tablet (75 mg total) by mouth 2 (two) times daily. 60 tablet 1  . Fiber CHEW Chew 1 tablet by mouth daily.    . fluticasone (FLONASE) 50 MCG/ACT nasal spray PLACE 2 SPRAYS INTO BOTH NOSTRILS DAILY. 16 g 4  . glucose blood test strip Use as instructed--- one touch verio test strips 100 each 12  . hydrochlorothiazide (HYDRODIURIL) 12.5 MG tablet Take 1 tablet (12.5 mg total) by mouth daily. 90 tablet 1  . ipratropium-albuterol (DUONEB) 0.5-2.5 (3) MG/3ML SOLN Take 3 mLs by nebulization every 6 (six) hours as needed. 360 mL 3  . meclizine (ANTIVERT) 25 MG tablet Take 1 tablet (25 mg total) by mouth 3 (three) times daily as needed. 45 tablet 3  . metFORMIN (GLUCOPHAGE-XR) 500 MG 24 hr tablet TAKE 1 TABLET (500 MG TOTAL) BY MOUTH DAILY WITH BREAKFAST. 90 tablet 1  . methocarbamol (ROBAXIN) 500 MG tablet Take 1 tablet (500 mg total) by mouth 2 (two) times daily. 20 tablet 0  . mometasone-formoterol (DULERA) 100-5 MCG/ACT AERO Inhale 2 puffs into the lungs 2 (two) times daily. 8.8 g 3  . Multiple Vitamin (MULTIVITAMIN) tablet Take 1 tablet by mouth daily.    . nitroGLYCERIN (NITRODUR - DOSED IN MG/24 HR) 0.2 mg/hr patch Apply 1/4th patch to affected shoulder, change daily 30 patch 1  . NONFORMULARY OR COMPOUNDED ITEM Nebulizer   DX ASTHMA 1 each 0  . omeprazole (PRILOSEC) 20 MG capsule Take 1 capsule (  20 mg total) by mouth daily. 30 capsule 3  . potassium chloride SA (K-DUR,KLOR-CON) 20 MEQ tablet Take 1 tablet (20 mEq total) by mouth daily. 90 tablet 1  . Spacer/Aero-Holding Chambers (AEROCHAMBER MV) inhaler Use as instructed 1 each 0  . traMADol (ULTRAM) 50 MG tablet Take 1 tablet (50 mg total) by mouth every 8  (eight) hours as needed. 30 tablet 3   No current facility-administered medications on file prior to visit.   She is allergic to losartan; augmentin; and oxycodone-acetaminophen..  Review of Systems Review of Systems  Constitutional: Negative for activity change, appetite change and fatigue.  HENT: Negative for hearing loss, congestion, tinnitus and ear discharge.  dentist q75m Eyes: Negative for visual disturbance (see optho q1y -- vision corrected to 20/20 with glasses).  Respiratory: Negative for cough, chest tightness and shortness of breath.   Cardiovascular: Negative for chest pain, palpitations and leg swelling.  Gastrointestinal: Negative for, diarrhea, constipation and abdominal distention. +abd pain Genitourinary: Negative for urgency, frequency, decreased urine volume and difficulty urinating.  Musculoskeletal: Negative for back pain, arthralgias and gait problem.  Skin: Negative for color change, pallor and rash.  Neurological: Negative for dizziness, light-headedness, numbness and headaches.  Hematological: Negative for adenopathy. Does not bruise/bleed easily.  Psychiatric/Behavioral: Negative for suicidal ideas, confusion, sleep disturbance, self-injury, dysphoric mood, decreased concentration and agitation.       Objective:    BP 114/72 mmHg  Pulse 66  Temp(Src) 98.8 F (37.1 C) (Oral)  Wt 229 lb 12.8 oz (104.237 kg)  SpO2 98%  LMP 05/20/2012 General appearance: alert, cooperative, appears stated age and no distress Head: Normocephalic, without obvious abnormality, atraumatic Eyes: conjunctivae/corneas clear. PERRL, EOM's intact. Fundi benign. Ears: normal TM's and external ear canals both ears Nose: Nares normal. Septum midline. Mucosa normal. No drainage or sinus tenderness. Throat: lips, mucosa, and tongue normal; teeth and gums normal Neck: no adenopathy, no carotid bruit, no JVD, supple, symmetrical, trachea midline and thyroid not enlarged, symmetric, no  tenderness/mass/nodules Back: symmetric, no curvature. ROM normal. No CVA tenderness. Lungs: clear to auscultation bilaterally Heart: regular rate and rhythm, S1, S2 normal, no murmur, click, rub or gallop Abdomen: Ruq tenderness  Extremities: extremities normal, atraumatic, no cyanosis or edema  Predictors of intubation difficulty:  Morbid obesity? yes   Anatomically abnormal facies? no  Prominent incisors? no  Receding mandible? no  Short, thick neck? no  Neck range of motion: normal  Dentition: No chipped, loose, or missing teeth.  Cardiographics ECG: normal sinus rhythm, no blocks or conduction defects, no ischemic changes Echocardiogram:  ORDERING Parrett, Tammy S REFERRING Parrett, Tammy S ATTENDING Loralie Champagne, M.D. SONOGRAPHER Cindy Hazy, RDCS PERFORMING [facility], Outpatient  cc:  ------------------------------------------------------------------- LV EF: 60% - 65%  ------------------------------------------------------------------- Indications: R06.00 Dyspnea.  ------------------------------------------------------------------- History: PMH: Acquired from the patient and from the patient&'s chart. PMH: Deaf. Thyroid Disease. Risk factors: Hypertension. Diabetes mellitus. Obese.  ------------------------------------------------------------------- Study Conclusions  - Left ventricle: The cavity size was normal. Wall thickness was  normal. Systolic function was normal. The estimated ejection  fraction was in the range of 60% to 65%. Wall motion was normal;  there were no regional wall motion abnormalities. Left  ventricular diastolic function parameters were normal.  Transthoracic echocardiography. M-mode, complete 2D, spectral Doppler, and color Doppler. Birthdate: Patient birthdate: [birth year]. Age: Patient is 55 yr old. Sex: Gender: female. BMI: 42.5 kg/m^2. Blood pressure: 100/70 Patient  status: Outpatient. Study date: Study date: 04/12/2016. Study time: 04:01 PM. Location: [facility]  3  -------------------------------------------------------------------  ------------------------------------------------------------------- Left ventricle: The cavity size was normal. Wall thickness was normal. Systolic function was normal. The estimated ejection fraction was in the range of 60% to 65%. Wall motion was normal; there were no regional wall motion abnormalities. The transmitral flow pattern was normal. The deceleration time of the early transmitral flow velocity was normal. The pulmonary vein flow pattern was normal. The tissue Doppler parameters were normal. Left ventricular diastolic function parameters were normal.  ------------------------------------------------------------------- Aortic valve: Structurally normal valve. Cusp separation was normal. Doppler: Transvalvular velocity was within the normal range. There was no stenosis. There was no regurgitation.  ------------------------------------------------------------------- Aorta: Aortic root: The aortic root was normal in size. Ascending aorta: The ascending aorta was normal in size.  ------------------------------------------------------------------- Mitral valve: Structurally normal valve. Leaflet separation was normal. Doppler: Transvalvular velocity was within the normal range. There was no evidence for stenosis. There was no regurgitation. Peak gradient (D): 3 mm Hg.  ------------------------------------------------------------------- Left atrium: The atrium was normal in size.  ------------------------------------------------------------------- Right ventricle: The cavity size was normal. Wall thickness was normal. Systolic function was normal.  ------------------------------------------------------------------- Pulmonic valve: The valve appears to be grossly  normal. Doppler: There was trivial regurgitation.  ------------------------------------------------------------------- Tricuspid valve: Structurally normal valve. Leaflet separation was normal. Doppler: Transvalvular velocity was within the normal range. There was trivial regurgitation.  ------------------------------------------------------------------- Right atrium: The atrium was normal in size.  ------------------------------------------------------------------- Pericardium: There was no pericardial effusion.  ------------------------------------------------------------------- Systemic veins: Inferior vena cava: Not visualized.  ------------------------------------------------------------------- Measurements  Left ventricle Value Reference LV ID, ED, PLAX chordal (L) 38.3 mm 43 - 52 LV ID, ES, PLAX chordal 23.8 mm 23 - 38 LV fx shortening, PLAX chordal 38 % >=29 LV PW thickness, ED 8.74 mm --------- IVS/LV PW ratio, ED 1.21 <=1.3 Stroke volume, 2D 40 ml --------- Stroke volume/bsa, 2D 18 ml/m^2 --------- LV e&', lateral 9.4 cm/s --------- LV E/e&', lateral 9.45 --------- LV e&', medial 9.56 cm/s --------- LV E/e&', medial 9.29 --------- LV e&', average 9.48 cm/s --------- LV E/e&', average 9.37 ---------  Ventricular septum Value Reference IVS thickness, ED 10.6 mm ---------  LVOT Value Reference LVOT ID, S 17 mm  --------- LVOT area 2.27 cm^2 --------- LVOT ID 17 mm --------- LVOT peak velocity, S 88 cm/s --------- LVOT mean velocity, S 64.2 cm/s --------- LVOT VTI, S 17.6 cm --------- LVOT peak gradient, S 3 mm Hg --------- Stroke volume (SV), LVOT DP 39.9 ml --------- Stroke index (SV/bsa), LVOT DP 18.1 ml/m^2 ---------  Aorta Value Reference Aortic root ID, ED 29 mm ---------  Left atrium Value Reference LA ID, A-P, ES 35 mm --------- LA ID/bsa, A-P 1.58 cm/m^2 <=2.2 LA volume, S 42 ml --------- LA volume/bsa, S 19 ml/m^2 --------- LA volume, ES, 1-p A4C 44 ml --------- LA volume/bsa, ES, 1-p A4C 19.9 ml/m^2 --------- LA volume, ES, 1-p A2C 36 ml --------- LA volume/bsa, ES, 1-p A2C 16.3 ml/m^2 ---------  Mitral valve Value Reference Mitral E-wave peak velocity 88.8 cm/s --------- Mitral A-wave peak velocity 102 cm/s --------- Mitral deceleration time 180 ms 150 - 230 Mitral peak gradient, D 3 mm Hg --------- Mitral E/A ratio, peak 0.9 ---------  Legend: (L) and (H) mark values outside specified reference range.  ------------------------------------------------------------------- Prepared and Electronically Authenticated by  Pierre Bali, MD 2017-06-07T18:09:28    PACS Images    Show images for  ECHOCARDIOGRAM COMPLETE    Patient Information    Patient Name Sex DOB SSN   Rhonda Morrow, Rhonda Morrow Female 01-Dec-1960 [id number]  Reason For Exam  Priority: Routine   Dyspnea 786.09 / R06.00   Dx: Dyspnea [R06.00 (ICD-10-CM)]    Surgical History    Procedure Laterality Date   CESAREAN SECTION     CARDIAC CATHETERIZATION     ESOPHAGEAL MANOMETRY N/A 09/29/2013   Procedure: ESOPHAGEAL MANOMETRY (EM); Surgeon: Milus Banister, MD; Location: [facility]; Service: Endoscopy; Laterality: N/A;     Performing Technologist/Nurse    Performing Technologist/Nurse: Natashia Rodgers-Jones    EF and Strain Measurements    Ejection Fraction  TDI e' lateral 9.4     TDI e' medial 9.56          2D Measurements    LV PW d 8.74 mm (A) (Range: 0.6 - 1.1)    FS 38 % (Range: 28 - 44)    LA ID, A-P, ES 35 mm    LA diam end sys 35 mm    LA vol 42 mL    LA vol index 19 mL/m2    IVS/LV PW RATIO, ED 1.21     FS 38 % (Range: 28 - 44)        Aortic Valve Measurements    Stenosis  LVOT diameter 17 mm    LVOT area 2.27 cm2    LVOT VTI 17.6 cm    LVOT peak vel 88 cm/s     PISA/HCM  LVOT peak grad rest 3 mmHg          Left Atrium Measurements    LA ID, A-P, ES 35 mm    LA vol 42 mL    LA vol index 19 mL/m2    LA vol A4C 44 ml    LVOT SV 40 mL        Mitral Valve Measurements    Stenosis  Peak grad 3 mmHg    MV pk A vel 102 m/s     Regurgitation  MV Dec 180     E decel time 180 msec     PISA-MS/PISA-MR  MV pk E vel 88.8 m/s         Implants     No active implants to display in this view.    Order-Level Documents:    There are no order-level documents.    Encounter-Level Documents - 04/12/2016:      Electronic signature on 04/12/2016 3:38 PM     Electronic signature on 04/12/2016 3:38 PM    Signed    Electronically signed by Jolaine Artist, MD on 04/12/16 at 1810 EDT    Printable Result Report    Result Report     Imaging Chest x-ray:  CLINICAL DATA: Dry cough for 1 day  EXAM: CHEST 2 VIEW  COMPARISON: 09/01/2015  FINDINGS: There is no focal parenchymal opacity. There is no pleural effusion or pneumothorax. There is mild stable cardiomegaly.  The osseous structures are unremarkable.  IMPRESSION: No active cardiopulmonary disease.   Electronically Signed  By: Kathreen Devoid  On: 02/21/2016 14:00  Lab Review  Appointment on 04/12/2016  Component Date Value  . LV PW d 04/12/2016 8.74*  . FS 04/12/2016 38   . LA vol 04/12/2016 42   . LA ID, A-P, ES 04/12/2016 35   . IVS/LV PW RATIO, ED 04/12/2016 1.21   . LVOT VTI 04/12/2016 17.6   . LV e' LATERAL 04/12/2016 9.4   . LV E/e' medial 04/12/2016 9.45   . LV E/e'average 04/12/2016 9.45   . LA diam index 04/12/2016 1.58   . LA  vol A4C 04/12/2016 44   . LVOT peak grad rest 04/12/2016 3   . E decel time 04/12/2016 180   . LVOT diameter 04/12/2016 17   . LVOT area 04/12/2016 2.27   . LVOT peak vel 04/12/2016 88   . LVOT SV 04/12/2016 40   . Peak grad 04/12/2016 3   . E/e' ratio 04/12/2016 9.45   . MV pk E vel 04/12/2016 88.8   . MV pk A vel 04/12/2016 102   . LA vol index 04/12/2016 19   . MV Dec 04/12/2016 180   . LA diam end sys 04/12/2016 35   . TDI e' medial 04/12/2016 9.56   . TDI e' lateral 04/12/2016 9.4   Office Visit on 03/30/2016  Component Date Value  . Sodium 03/30/2016 139   . Potassium 03/30/2016 3.4*  . Chloride 03/30/2016 102   . CO2 03/30/2016 30   . Glucose, Bld 03/30/2016 90   . BUN 03/30/2016 18   . Creatinine, Ser 03/30/2016 1.05   . Total Bilirubin 03/30/2016 0.6   . Alkaline Phosphatase 03/30/2016 65   . AST 03/30/2016 25   . ALT 03/30/2016 34   . Total Protein 03/30/2016 7.2   . Albumin 03/30/2016 4.0   . Calcium 03/30/2016 9.9   . GFR 03/30/2016 70.63   . WBC 03/30/2016 6.9   . RBC 03/30/2016 5.03   . Hemoglobin 03/30/2016 13.5   . HCT 03/30/2016 41.4   . MCV 03/30/2016 82.3    . MCHC 03/30/2016 32.6   . RDW 03/30/2016 14.7   . Platelets 03/30/2016 227.0   . Neutrophils Relative % 03/30/2016 57.9   . Lymphocytes Relative 03/30/2016 28.7   . Monocytes Relative 03/30/2016 10.6   . Eosinophils Relative 03/30/2016 2.2   . Basophils Relative 03/30/2016 0.6   . Neutro Abs 03/30/2016 4.0   . Lymphs Abs 03/30/2016 2.0   . Monocytes Absolute 03/30/2016 0.7   . Eosinophils Absolute 03/30/2016 0.2   . Basophils Absolute 03/30/2016 0.0   . H Pylori IgG 03/30/2016 Negative   . Amylase 03/30/2016 30   . Lipase 03/30/2016 3.0*  Appointment on 03/15/2016  Component Date Value  . D-Dimer, Quant 03/15/2016 0.63*  Office Visit on 02/08/2016  Component Date Value  . Color, UA 02/08/2016 yellow   . Clarity, UA 02/08/2016 clear   . Glucose, UA 02/08/2016 neg   . Bilirubin, UA 02/08/2016 neg   . Ketones, UA 02/08/2016 neg   . Spec Grav, UA 02/08/2016 1.015   . Blood, UA 02/08/2016 neg   . pH, UA 02/08/2016 6.5   . Protein, UA 02/08/2016 trace   . Urobilinogen, UA 02/08/2016 1.0   . Nitrite, UA 02/08/2016 neg   . Leukocytes, UA 02/08/2016 Negative   . Cholesterol 02/08/2016 151   . Triglycerides 02/08/2016 84.0   . HDL 02/08/2016 53.00   . VLDL 02/08/2016 16.8   . LDL Cholesterol 02/08/2016 81   . Total CHOL/HDL Ratio 02/08/2016 3   . NonHDL 02/08/2016 98.19   . Hgb A1c MFr Bld 02/08/2016 6.8*  . Sodium 02/08/2016 140   . Potassium 02/08/2016 3.5   . Chloride 02/08/2016 101   . CO2 02/08/2016 32   . Glucose, Bld 02/08/2016 102*  . BUN 02/08/2016 13   . Creatinine, Ser 02/08/2016 0.83   . Total Bilirubin 02/08/2016 0.5   . Alkaline Phosphatase 02/08/2016 62   . AST 02/08/2016 23   . ALT 02/08/2016 25   . Total Protein 02/08/2016  7.5   . Albumin 02/08/2016 4.0   . Calcium 02/08/2016 9.9   . GFR 02/08/2016 92.69   . HCV Ab 02/08/2016 NEGATIVE   . HIV 1&2 Ab, 4th Generati* 02/08/2016 NONREACTIVE   . VITD 02/08/2016 52.50   Abstract on 01/06/2016  Component  Date Value  . HM Diabetic Eye Exam 01/04/2016 No Retinopathy   Abstract on 12/17/2015  Component Date Value  . HM Mammogram 11/25/2015 category A: mostly fat tissue   . HM Pap smear 10/28/2015 completed12/22/16   . HM Dexa Scan 11/23/2015 normal   Office Visit on 11/23/2015  Component Date Value  . Sodium 11/23/2015 140   . Potassium 11/23/2015 3.7   . Chloride 11/23/2015 102   . CO2 11/23/2015 29   . Glucose, Bld 11/23/2015 102*  . BUN 11/23/2015 16   . Creatinine, Ser 11/23/2015 0.91   . Total Bilirubin 11/23/2015 0.3   . Alkaline Phosphatase 11/23/2015 69   . AST 11/23/2015 19   . ALT 11/23/2015 19   . Total Protein 11/23/2015 7.1   . Albumin 11/23/2015 3.6   . Calcium 11/23/2015 9.4   . GFR 11/23/2015 83.42   . WBC 11/23/2015 6.2   . RBC 11/23/2015 4.99   . Hemoglobin 11/23/2015 13.6   . HCT 11/23/2015 41.5   . MCV 11/23/2015 83.2   . MCHC 11/23/2015 32.7   . RDW 11/23/2015 14.5   . Platelets 11/23/2015 221.0   . Neutrophils Relative % 11/23/2015 60.0   . Lymphocytes Relative 11/23/2015 22.6   . Monocytes Relative 11/23/2015 15.3*  . Eosinophils Relative 11/23/2015 1.8   . Basophils Relative 11/23/2015 0.3   . Neutro Abs 11/23/2015 3.7   . Lymphs Abs 11/23/2015 1.4   . Monocytes Absolute 11/23/2015 0.9   . Eosinophils Absolute 11/23/2015 0.1   . Basophils Absolute 11/23/2015 0.0   . Magnesium 11/23/2015 1.9   Office Visit on 11/16/2015  Component Date Value  . Sodium 11/16/2015 142   . Potassium 11/16/2015 3.6   . Chloride 11/16/2015 104   . CO2 11/16/2015 31   . Glucose, Bld 11/16/2015 99   . BUN 11/16/2015 14   . Creatinine, Ser 11/16/2015 0.75   . Total Bilirubin 11/16/2015 0.5   . Alkaline Phosphatase 11/16/2015 66   . AST 11/16/2015 22   . ALT 11/16/2015 22   . Total Protein 11/16/2015 7.2   . Albumin 11/16/2015 3.9   . Calcium 11/16/2015 9.6   . GFR 11/16/2015 104.28   . Magnesium 11/16/2015 2.1   Office Visit on 10/27/2015  Component Date Value   . Hgb A1c MFr Bld 10/27/2015 6.4   . Sodium 10/27/2015 140   . Potassium 10/27/2015 3.1*  . Chloride 10/27/2015 102   . CO2 10/27/2015 31   . Glucose, Bld 10/27/2015 84   . BUN 10/27/2015 14   . Creatinine, Ser 10/27/2015 0.85   . Calcium 10/27/2015 9.6   . GFR 10/27/2015 90.28   . Microalb, Ur 10/27/2015 0.8   . Creatinine,U 10/27/2015 175.8   . Microalb Creat Ratio 10/27/2015 0.5       Assessment:      55 y.o. female with planned surgery as above.   Known risk factors for perioperative complications: Chronic pulmonary disease Diabetes mellitus   Difficulty with intubation is not anticipated.  Cardiac Risk Estimation: low  ekg-- nsr ,  No acute changes See echo above   Plan:    1. Preoperative workup as follows ECG, see labs.-- echo  done, stress test  2. Change in medication regimen before surgery: per surgical team 3  Invasive hemodynamic monitoring perioperatively: not indicated. 4. Deep vein thrombosis prophylaxis postoperatively:regimen to be chosen by surgical team. 5. Surveillance for postoperative MI with ECG immediately postoperatively and on postoperative days 1 and 2 AND troponin levels 24 hours postoperatively and on day 4 or hospital discharge (whichever comes first): at the discretion of anesthesiologist. 6. Other measures: consult [facility] -- prn   So far work up normal--- if stress test is normal -- pt cleared for surgery

## 2016-04-25 LAB — POCT URINALYSIS DIPSTICK
Blood, UA: NEGATIVE
GLUCOSE UA: NEGATIVE
Leukocytes, UA: NEGATIVE
Nitrite, UA: NEGATIVE
Urobilinogen, UA: 1
pH, UA: 6

## 2016-05-04 ENCOUNTER — Telehealth (HOSPITAL_COMMUNITY): Payer: Self-pay | Admitting: *Deleted

## 2016-05-04 ENCOUNTER — Telehealth: Payer: Self-pay | Admitting: Family Medicine

## 2016-05-04 NOTE — Telephone Encounter (Signed)
Patient given detailed instructions per Myocardial Perfusion Study Information Sheet for the test on 05/10/16 Patient notified to arrive 15 minutes early and that it is imperative to arrive on time for appointment to keep from having the test rescheduled.  If you need to cancel or reschedule your appointment, please call the office within 24 hours of your appointment. Failure to do so may result in a cancellation of your appointment, and a $50 no show fee. Patient verbalized understanding. Hubbard Robinson, RN

## 2016-05-04 NOTE — Telephone Encounter (Signed)
Last OV:  04/24/16. Pt was triaged by [facility].  Disposition was to call 911.  Pt declined.  Stated if symptoms return, she would call 911.

## 2016-05-04 NOTE — Telephone Encounter (Signed)
Patient Name: Rhonda Morrow DOB: [birth year] Initial Comment Caller states she's woke up to go to the bathroom, and her leg locked up. Her knee is a little swelled. Abdominal pain and back pain. Nurse Assessment Nurse: Andria Frames, RN, Aeriel Date/Time (Eastern Time): 05/04/2016 11:24:50 AM Confirm and document reason for call. If symptomatic, describe symptoms. You must click the next button to save text entered. ---Caller states she's woke up to go to the bathroom, and her leg locked up. Her knee is a little swelled. Abdominal pain and back pain. Caller states, back pain is 7 out of 10 and abdominal pain is 6 out of 10. She has taken aleve for the pain. Caller states, when she woke up and she started to walk. Her leg just wouldn't move. She had to push it to help her move it. She couldn't walk at all then it worked itself out. There was tingling. It did fill numb as she was dragging it. Has the patient traveled out of the country within the last 30 days? ---Not Applicable Does the patient have any new or worsening symptoms? ---Yes Will a triage be completed? ---Yes Related visit to physician within the last 2 weeks? ---Yes Does the PT have any chronic conditions? (i.e. diabetes, asthma, etc.) ---Yes List chronic conditions. ---diabetes, asthma Is the patient pregnant or possibly pregnant? (Ask all females between the ages of 4-55) ---No Is this a behavioral health or substance abuse call? ---No Guidelines Guideline Title Affirmed Question Affirmed Notes Neurologic Deficit [1] Weakness (i.e., paralysis, loss of muscle strength) of the face, arm / hand, or leg / foot on one side of the body AND [2] sudden onset AND [3] present now Final Disposition User Call EMS 911 Now Hensel, RN, Aeriel Comments Caller refused 911 disposition states, she just wants to wait and see. If it happens again she will go to the ED. Disagree/Comply: Disagree Disagree/Comply Reason: Wait and see

## 2016-05-04 NOTE — Telephone Encounter (Signed)
Left message on voicemail in reference to upcoming appointment scheduled for 05/10/16. Phone number given for a call back so details instructions can be given. Katrianna Friesenhahn J Kassy Mcenroe, RN  

## 2016-05-05 ENCOUNTER — Ambulatory Visit (INDEPENDENT_AMBULATORY_CARE_PROVIDER_SITE_OTHER): Payer: Federal, State, Local not specified - PPO | Admitting: Family Medicine

## 2016-05-05 ENCOUNTER — Encounter: Payer: Self-pay | Admitting: Family Medicine

## 2016-05-05 VITALS — BP 124/78 | HR 72 | Temp 98.0°F | Wt 231.8 lb

## 2016-05-05 DIAGNOSIS — M25561 Pain in right knee: Secondary | ICD-10-CM | POA: Diagnosis not present

## 2016-05-05 DIAGNOSIS — R102 Pelvic and perineal pain: Secondary | ICD-10-CM | POA: Diagnosis not present

## 2016-05-05 DIAGNOSIS — R8290 Unspecified abnormal findings in urine: Secondary | ICD-10-CM

## 2016-05-05 LAB — POCT URINALYSIS DIPSTICK
Glucose, UA: NEGATIVE
KETONES UA: NEGATIVE
Leukocytes, UA: NEGATIVE
Nitrite, UA: NEGATIVE
PH UA: 6
RBC UA: NEGATIVE
Urobilinogen, UA: 1

## 2016-05-05 NOTE — Patient Instructions (Signed)

## 2016-05-05 NOTE — Assessment & Plan Note (Signed)
Knee sleeve Rest Pain is improving -- if it does not completely resolve, f/u sport med

## 2016-05-05 NOTE — Progress Notes (Signed)
Pre visit review using our clinic review tool, if applicable. No additional management support is needed unless otherwise documented below in the visit note. 

## 2016-05-05 NOTE — Progress Notes (Signed)
Patient ID: Rhonda Morrow, female    DOB: [birth year]  Age: 55 y.o. MRN: [id number]    Subjective:  Subjective HPI Rhonda Morrow presents for R knee pain and weakness/ numbness and on Wednesday she got out of bed to go to the br and when she got up to go back to bed her knee gave out on her and she felt a pop and knee was numb--- it has improved but it still bothers her .  Aleve helps.    Review of Systems  Constitutional: Negative for diaphoresis, appetite change, fatigue and unexpected weight change.  Eyes: Negative for pain, redness and visual disturbance.  Respiratory: Negative for cough, chest tightness, shortness of breath and wheezing.   Cardiovascular: Negative for chest pain, palpitations and leg swelling.  Endocrine: Negative for cold intolerance, heat intolerance, polydipsia, polyphagia and polyuria.  Genitourinary: Negative for dysuria, frequency and difficulty urinating.  Musculoskeletal: Positive for arthralgias and gait problem.  Neurological: Negative for dizziness, light-headedness, numbness and headaches.    History Past Medical History  Diagnosis Date  . Hypertension   . Deaf   . Asthma   . Thyroid disease   . Diabetes mellitus without complication [facility])     She has past surgical history that includes Cesarean section; Cardiac catheterization; and Esophageal manometry (N/A, 09/29/2013).   Her family history includes Diabetes in her father and mother; Heart disease in her mother.She reports that she has never smoked. She has never used smokeless tobacco. She reports that she does not drink alcohol or use illicit drugs.  Current Outpatient Prescriptions on File Prior to Visit  Medication Sig Dispense Refill  . albuterol (PROVENTIL HFA;VENTOLIN HFA) 108 (90 Base) MCG/ACT inhaler Inhale 2 puffs into the lungs every 6 (six) hours as needed for wheezing. 1 Inhaler 3  . amLODipine (NORVASC) 5 MG tablet TAKE 1 TABLET (5 MG TOTAL) BY MOUTH DAILY. 30 tablet 6  . cetirizine  (ZYRTEC) 10 MG tablet Take 1 tablet (10 mg total) by mouth daily. 30 tablet 11  . cyclobenzaprine (FLEXERIL) 5 MG tablet Take 1 tablet (5 mg total) by mouth at bedtime. 7 tablet 0  . diclofenac (VOLTAREN) 75 MG EC tablet Take 1 tablet (75 mg total) by mouth 2 (two) times daily. 60 tablet 1  . Fiber CHEW Chew 1 tablet by mouth daily.    . fluticasone (FLONASE) 50 MCG/ACT nasal spray PLACE 2 SPRAYS INTO BOTH NOSTRILS DAILY. 16 g 4  . glucose blood test strip Use as instructed--- one touch verio test strips 100 each 12  . hydrochlorothiazide (HYDRODIURIL) 12.5 MG tablet Take 1 tablet (12.5 mg total) by mouth daily. 90 tablet 1  . ipratropium-albuterol (DUONEB) 0.5-2.5 (3) MG/3ML SOLN Take 3 mLs by nebulization every 6 (six) hours as needed. 360 mL 3  . meclizine (ANTIVERT) 25 MG tablet Take 1 tablet (25 mg total) by mouth 3 (three) times daily as needed. 45 tablet 3  . metFORMIN (GLUCOPHAGE-XR) 500 MG 24 hr tablet TAKE 1 TABLET (500 MG TOTAL) BY MOUTH DAILY WITH BREAKFAST. 90 tablet 1  . methocarbamol (ROBAXIN) 500 MG tablet Take 1 tablet (500 mg total) by mouth 2 (two) times daily. 20 tablet 0  . mometasone-formoterol (DULERA) 100-5 MCG/ACT AERO Inhale 2 puffs into the lungs 2 (two) times daily. 8.8 g 3  . Multiple Vitamin (MULTIVITAMIN) tablet Take 1 tablet by mouth daily.    . nitroGLYCERIN (NITRODUR - DOSED IN MG/24 HR) 0.2 mg/hr patch Apply 1/4th patch to affected shoulder, change  daily 30 patch 1  . NONFORMULARY OR COMPOUNDED ITEM Nebulizer   DX ASTHMA 1 each 0  . omeprazole (PRILOSEC) 20 MG capsule Take 1 capsule (20 mg total) by mouth daily. 30 capsule 3  . potassium chloride SA (K-DUR,KLOR-CON) 20 MEQ tablet Take 1 tablet (20 mEq total) by mouth daily. 90 tablet 1  . Spacer/Aero-Holding Chambers (AEROCHAMBER MV) inhaler Use as instructed 1 each 0  . traMADol (ULTRAM) 50 MG tablet Take 1 tablet (50 mg total) by mouth every 8 (eight) hours as needed. 30 tablet 3   No current  facility-administered medications on file prior to visit.     Objective:  Objective Physical Exam  Constitutional: She is oriented to person, place, and time. She appears well-developed and well-nourished.  HENT:  Head: Normocephalic and atraumatic.  Eyes: Conjunctivae and EOM are normal.  Neck: Normal range of motion. Neck supple. No JVD present. Carotid bruit is not present. No thyromegaly present.  Cardiovascular: Normal rate, regular rhythm and normal heart sounds.   No murmur heard. Pulmonary/Chest: Effort normal and breath sounds normal. No respiratory distress. She has no wheezes. She has no rales. She exhibits no tenderness.  Musculoskeletal: She exhibits tenderness. She exhibits no edema.       Right knee: Tenderness found. Lateral joint line tenderness noted.       Legs: Neurological: She is alert and oriented to person, place, and time.  Psychiatric: She has a normal mood and affect.  Nursing note and vitals reviewed.  BP 124/78 mmHg  Pulse 72  Temp(Src) 98 F (36.7 C) (Oral)  Wt 231 lb 12.8 oz (105.144 kg)  SpO2 98%  LMP 05/20/2012 Wt Readings from Last 3 Encounters:  05/05/16 231 lb 12.8 oz (105.144 kg)  04/24/16 229 lb 12.8 oz (104.237 kg)  03/30/16 232 lb 9.6 oz (105.507 kg)     Lab Results  Component Value Date   WBC 7.1 04/24/2016   HGB 13.2 04/24/2016   HCT 40.1 04/24/2016   PLT 243.0 04/24/2016   GLUCOSE 93 04/24/2016   CHOL 148 04/24/2016   TRIG 84.0 04/24/2016   HDL 52.20 04/24/2016   LDLCALC 79 04/24/2016   ALT 18 04/24/2016   AST 15 04/24/2016   NA 140 04/24/2016   K 3.7 04/24/2016   CL 102 04/24/2016   CREATININE 0.92 04/24/2016   BUN 15 04/24/2016   CO2 30 04/24/2016   TSH 0.81 04/24/2016   INR 1.00 12/30/2012   HGBA1C 6.4 04/24/2016   MICROALBUR 0.8 10/27/2015    US Abdomen Complete  03/30/2016  CLINICAL DATA:  Distended gallbladder on CT, epigastric pain for 3 month EXAM: ABDOMEN ULTRASOUND COMPLETE COMPARISON:  CT chest of  03/21/2016 FINDINGS: Gallbladder: The gallbladder is slightly distended and there are several gallstones with to mobile within the gallbladder. The largest gallstone measures 1.7 cm and there is acoustical shadowing present. There is no pain over the gallbladder with compression. Common bile duct: Diameter: The common bile duct is normal measuring 3 mm in diameter. Liver: The liver somewhat echogenic suggesting fatty infiltration. No focal hepatic abnormality is seen. Correlation with LFTs is recommended. IVC: The IVC is partially obscured by bowel gas. Pancreas: Much of the pancreas also is obscured by bowel gas. Spleen: The spleen is normal measuring 7 cm. Right Kidney: Length: 10.5 cm.  No hydronephrosis is seen. Left Kidney: Length: 9.7 cm. No hydronephrosis is noted. The abdominal aorta is partially obscured by bowel gas and body habitus. Abdominal aorta: No aneurysm visualized.  Other findings: None. IMPRESSION: 1. Multiple mobile gallstones with slight distention of the of the gallbladder. No pain is present over the gallbladder with compression currently. 2. Slightly increased echogenicity of the liver may indicate mild fatty infiltration. Correlate with LFTs. 3. Bowel gas and body habitus obscure portions of the pancreas and the abdominal aorta. Electronically Signed   By: Ivar Drape M.D.   On: 03/30/2016 16:53     Assessment & Plan:  Plan I am having Ms. Owens Shark maintain her meclizine, multivitamin, Fiber, amLODipine, methocarbamol, nitroGLYCERIN, cyclobenzaprine, fluticasone, diclofenac, cetirizine, traMADol, hydrochlorothiazide, ipratropium-albuterol, glucose blood, NONFORMULARY OR COMPOUNDED ITEM, albuterol, mometasone-formoterol, potassium chloride SA, AEROCHAMBER MV, omeprazole, and metFORMIN.  No orders of the defined types were placed in this encounter.    Problem List Items Addressed This Visit      Unprioritized   Knee pain, right    Knee sleeve Rest Pain is improving -- if it does  not completely resolve, f/u sport med       Other Visit Diagnoses    Right knee pain    -  Primary    Vaginal pain        Relevant Orders    POCT urinalysis dipstick (Completed)    Urine Culture (Completed)    Urine abnormality        Relevant Orders    Urine Culture (Completed)    Pain, pelvic, female        Relevant Orders    Urine Culture (Completed)       Follow-up: Return if symptoms worsen or fail to improve.  Ann Held, DO

## 2016-05-07 LAB — URINE CULTURE: Colony Count: 30000

## 2016-05-10 ENCOUNTER — Ambulatory Visit (HOSPITAL_COMMUNITY): Payer: Federal, State, Local not specified - PPO | Attending: Cardiovascular Disease

## 2016-05-10 DIAGNOSIS — E1151 Type 2 diabetes mellitus with diabetic peripheral angiopathy without gangrene: Secondary | ICD-10-CM | POA: Diagnosis not present

## 2016-05-10 DIAGNOSIS — I10 Essential (primary) hypertension: Secondary | ICD-10-CM | POA: Insufficient documentation

## 2016-05-10 DIAGNOSIS — E785 Hyperlipidemia, unspecified: Secondary | ICD-10-CM

## 2016-05-10 DIAGNOSIS — Z01810 Encounter for preprocedural cardiovascular examination: Secondary | ICD-10-CM | POA: Diagnosis not present

## 2016-05-10 DIAGNOSIS — R0602 Shortness of breath: Secondary | ICD-10-CM | POA: Insufficient documentation

## 2016-05-10 MED ORDER — TECHNETIUM TC 99M TETROFOSMIN IV KIT
31.7000 | PACK | Freq: Once | INTRAVENOUS | Status: AC | PRN
  Administered 2016-05-10: 31.7 via INTRAVENOUS
  Filled 2016-05-10: qty 32

## 2016-05-10 MED ORDER — REGADENOSON 0.4 MG/5ML IV SOLN
0.4000 mg | Freq: Once | INTRAVENOUS | Status: AC
  Administered 2016-05-10: 0.4 mg via INTRAVENOUS

## 2016-05-11 ENCOUNTER — Ambulatory Visit (HOSPITAL_COMMUNITY): Payer: Federal, State, Local not specified - PPO | Attending: Internal Medicine

## 2016-05-11 ENCOUNTER — Encounter: Payer: Self-pay | Admitting: Obstetrics & Gynecology

## 2016-05-11 ENCOUNTER — Telehealth: Payer: Self-pay | Admitting: Family Medicine

## 2016-05-11 DIAGNOSIS — Z7689 Persons encountering health services in other specified circumstances: Secondary | ICD-10-CM

## 2016-05-11 LAB — MYOCARDIAL PERFUSION IMAGING
CHL CUP NUCLEAR SDS: 5
CHL CUP NUCLEAR SRS: 13
CHL CUP NUCLEAR SSS: 18
CHL CUP RESTING HR STRESS: 73 {beats}/min
LV dias vol: 59 mL (ref 46–106)
LV sys vol: 24 mL
Peak HR: 111 {beats}/min
RATE: 0.25
TID: 0.97

## 2016-05-11 MED ORDER — TECHNETIUM TC 99M TETROFOSMIN IV KIT
31.7000 | PACK | Freq: Once | INTRAVENOUS | Status: AC | PRN
  Administered 2016-05-11: 31.7 via INTRAVENOUS
  Filled 2016-05-11: qty 32

## 2016-05-11 NOTE — Telephone Encounter (Signed)
Pt dropped off document to be filled out (FMLA paperwork). Pt states her last FMLA has expired (05-10-16) and is needing more time off (at least 6 months).

## 2016-05-14 ENCOUNTER — Other Ambulatory Visit: Payer: Self-pay | Admitting: Family Medicine

## 2016-05-16 ENCOUNTER — Encounter: Payer: Self-pay | Admitting: Pulmonary Disease

## 2016-05-16 ENCOUNTER — Ambulatory Visit (INDEPENDENT_AMBULATORY_CARE_PROVIDER_SITE_OTHER): Payer: Federal, State, Local not specified - PPO | Admitting: Pulmonary Disease

## 2016-05-16 ENCOUNTER — Other Ambulatory Visit: Payer: Self-pay | Admitting: Family Medicine

## 2016-05-16 VITALS — BP 118/84 | HR 78 | Ht 64.0 in | Wt 239.0 lb

## 2016-05-16 DIAGNOSIS — E669 Obesity, unspecified: Secondary | ICD-10-CM | POA: Diagnosis not present

## 2016-05-16 DIAGNOSIS — G4733 Obstructive sleep apnea (adult) (pediatric): Secondary | ICD-10-CM | POA: Diagnosis not present

## 2016-05-16 DIAGNOSIS — G4710 Hypersomnia, unspecified: Secondary | ICD-10-CM

## 2016-05-16 DIAGNOSIS — J4530 Mild persistent asthma, uncomplicated: Secondary | ICD-10-CM | POA: Diagnosis not present

## 2016-05-16 NOTE — Assessment & Plan Note (Signed)
Pt is known to have asthma. Dxed 2014.Triggers: work environment. She works at a [employer].  She uses Dulera 2P BID and alb prn. Asthma has been stable. No recnt flare up.  Denies sinus issues. Will observe for now.

## 2016-05-16 NOTE — Progress Notes (Addendum)
Subjective:    Patient ID: [name], female    DOB: 26-Mar-1961, 55 y.o.   MRN: [id number]  HPI    This is the case of Jazzmyn Byes, 55 y.o. Female, who was referred by Dr. Lyndal Pulley in consultation regarding possible OSA.   Pt is deaf and mute congenitally.  I had Britt Bolognese to sign for me to interview pt.   As you very well know, patient is a non smoker.  Pt is known to have asthma. Dxed 2014.Triggers: work environment. She uses Dulera 2P BID and alb prn. Asthma has been stable. No recnt flare up.  Denies sinus issues.   Pt has hypersomnia. Sleeps 6-8 hrs/night. Has frequent awakenings. Has snoring, gasping, choking. Has unrefreshed sleep.  Has hypersomnia which affects fxnality. Falls aslep at break time and lunch time.  Not much commute. (-) abnormal behavior in sleep.   ESS 11.    Review of Systems  Constitutional: Negative for fever and unexpected weight change.  HENT: Negative for congestion, dental problem, ear pain, nosebleeds, postnasal drip, rhinorrhea, sinus pressure, sneezing, sore throat and trouble swallowing.   Eyes: Negative for redness and itching.  Respiratory: Positive for shortness of breath. Negative for cough, chest tightness and wheezing.   Cardiovascular: Negative for palpitations and leg swelling.  Gastrointestinal: Negative for nausea and vomiting.  Genitourinary: Negative for dysuria.  Musculoskeletal: Negative for joint swelling.  Skin: Negative for rash.  Neurological: Negative for headaches.  Hematological: Does not bruise/bleed easily.  Psychiatric/Behavioral: Negative for dysphoric mood. The patient is not nervous/anxious.    Past Medical History  Diagnosis Date  . Hypertension   . Deaf   . Asthma   . Thyroid disease   . Diabetes mellitus without complication (HCC)    (-) CA, DVT  Family History  Problem Relation Age of Onset  . Diabetes Mother   . Diabetes Father   . Heart disease Mother      Past Surgical History    Procedure Laterality Date  . Cesarean section    . Cardiac catheterization    . Esophageal manometry N/A 09/29/2013    Procedure: ESOPHAGEAL MANOMETRY (EM);  Surgeon: Milus Banister, MD;  Location: [facility];  Service: Endoscopy;  Laterality: N/A;    Social History   Social History  . Marital Status: Married    Spouse Name: N/A  . Number of Children: N/A  . Years of Education: N/A   Occupational History  . diabled    Social History Main Topics  . Smoking status: Never Smoker   . Smokeless tobacco: Never Used  . Alcohol Use: No  . Drug Use: No  . Sexual Activity: Not on file   Other Topics Concern  . Not on file   Social History Narrative   Married with 3 boys. Works at a post office. (-) smoking, ETOH.   Allergies  Allergen Reactions  . Losartan Shortness Of Breath  . Augmentin [Amoxicillin-Pot Clavulanate] Diarrhea  . Oxycodone-Acetaminophen Nausea And Vomiting     Outpatient Prescriptions Prior to Visit  Medication Sig Dispense Refill  . albuterol (PROVENTIL HFA;VENTOLIN HFA) 108 (90 Base) MCG/ACT inhaler Inhale 2 puffs into the lungs every 6 (six) hours as needed for wheezing. 1 Inhaler 3  . amLODipine (NORVASC) 5 MG tablet TAKE 1 TABLET (5 MG TOTAL) BY MOUTH DAILY. 30 tablet 6  . cetirizine (ZYRTEC) 10 MG tablet Take 1 tablet (10 mg total) by mouth daily. 30 tablet 11  . cyclobenzaprine (FLEXERIL) 5  MG tablet Take 1 tablet (5 mg total) by mouth at bedtime. 7 tablet 0  . diclofenac (VOLTAREN) 75 MG EC tablet Take 1 tablet (75 mg total) by mouth 2 (two) times daily. 60 tablet 1  . Fiber CHEW Chew 1 tablet by mouth daily.    . fluticasone (FLONASE) 50 MCG/ACT nasal spray PLACE 2 SPRAYS INTO BOTH NOSTRILS DAILY. 16 g 4  . glucose blood test strip Use as instructed--- one touch verio test strips 100 each 12  . hydrochlorothiazide (HYDRODIURIL) 12.5 MG tablet Take 1 tablet (12.5 mg total) by mouth daily. 90 tablet 1  . ipratropium-albuterol (DUONEB) 0.5-2.5 (3)  MG/3ML SOLN Take 3 mLs by nebulization every 6 (six) hours as needed. 360 mL 3  . meclizine (ANTIVERT) 25 MG tablet Take 1 tablet (25 mg total) by mouth 3 (three) times daily as needed. 45 tablet 3  . metFORMIN (GLUCOPHAGE-XR) 500 MG 24 hr tablet TAKE 1 TABLET (500 MG TOTAL) BY MOUTH DAILY WITH BREAKFAST. 90 tablet 1  . methocarbamol (ROBAXIN) 500 MG tablet Take 1 tablet (500 mg total) by mouth 2 (two) times daily. 20 tablet 0  . mometasone-formoterol (DULERA) 100-5 MCG/ACT AERO Inhale 2 puffs into the lungs 2 (two) times daily. 8.8 g 3  . Multiple Vitamin (MULTIVITAMIN) tablet Take 1 tablet by mouth daily.    . nitroGLYCERIN (NITRODUR - DOSED IN MG/24 HR) 0.2 mg/hr patch Apply 1/4th patch to affected shoulder, change daily 30 patch 1  . NONFORMULARY OR COMPOUNDED ITEM Nebulizer   DX ASTHMA 1 each 0  . omeprazole (PRILOSEC) 20 MG capsule Take 1 capsule (20 mg total) by mouth daily. 30 capsule 3  . potassium chloride SA (K-DUR,KLOR-CON) 20 MEQ tablet Take 1 tablet (20 mEq total) by mouth daily. 90 tablet 1  . Spacer/Aero-Holding Chambers (AEROCHAMBER MV) inhaler Use as instructed 1 each 0  . traMADol (ULTRAM) 50 MG tablet Take 1 tablet (50 mg total) by mouth every 8 (eight) hours as needed. 30 tablet 3   No facility-administered medications prior to visit.   No orders of the defined types were placed in this encounter.          Objective:   Physical Exam  Vitals:  Filed Vitals:   05/16/16 1106  BP: 118/84  Pulse: 78  Height: 5\' 4"  (1.626 m)  Weight: 239 lb (108.41 kg)  SpO2: 98%    Constitutional/General:  Pleasant, well-nourished, well-developed, not in any distress,  Comfortably seating.  Well kempt  Body mass index is 41 kg/(m^2). Wt Readings from Last 3 Encounters:  05/16/16 239 lb (108.41 kg)  05/10/16 229 lb (103.874 kg)  05/05/16 231 lb 12.8 oz (105.144 kg)    Neck circumference:   HEENT: Pupils equal and reactive to light and accommodation. Anicteric sclerae.  Normal nasal mucosa.   No oral  lesions,  mouth clear,  oropharynx clear, no postnasal drip. (-) Oral thrush. No dental caries.  Airway - Mallampati class III  Neck: No masses. Midline trachea. No JVD, (-) LAD. (-) bruits appreciated.  Respiratory/Chest: Grossly normal chest. (-) deformity. (-) Accessory muscle use.  Symmetric expansion. (-) Tenderness on palpation.  Resonant on percussion.  Diminished BS on both lower lung zones. (-) wheezing, crackles, rhonchi (-) egophony  Cardiovascular: Regular rate and  rhythm, heart sounds normal, no murmur or gallops, no peripheral edema  Gastrointestinal:  Normal bowel sounds. Soft, non-tender. No hepatosplenomegaly.  (-) masses.   Musculoskeletal:  Normal muscle tone. Normal gait.   Extremities:  Grossly normal. (-) clubbing, cyanosis.  (-) edema  Skin: (-) rash,lesions seen.   Neurological/Psychiatric : alert, oriented to time, place, person. Normal mood and affect             Assessment & Plan:  Hypersomnia Pt has hypersomnia. Sleeps 6-8 hrs/night. Has frequent awakenings. Has snoring, gasping, choking. Has unrefreshed sleep.  Has hypersomnia which affects fxnality. Falls aslep at break time and lunch time.  Not much commute. (-) abnormal behavior in sleep.   ESS 11.   Plan: Patient has snoring, hypersomnia, witnessed apneas, choking, gasping during sleep.  We discussed about the diagnosis of Obstructive Sleep Apnea (OSA) and implications of untreated OSA. We discussed about CPAP and BiPaP as possible treatment options.   We will schedule the patient for a sleep study. Plan for inlab sleep study. Pt plans on having GB surgery but I advised to hold off unless urgent. Need to Rx OSA first.    Patient was instructed to call the office if he/she has not heard back from the office 1-2 weeks after the sleep study.   Patient was instructed to call the office if he/she is having issues with the PAP device.   We discussed  good sleep hygiene.   Patient was advised not to engage in activities requiring concentration and/or vigilance if he/she is sleepy.  Patient was advised not to drive if he/she is sleepy.     Asthma in adult Pt is known to have asthma. Dxed 2014.Triggers: work environment. She works at a post office.  She uses Dulera 2P BID and alb prn. Asthma has been stable. No recnt flare up.  Denies sinus issues. Will observe for now.   Obesity Weight reduction     Thank you very much for letting me participate in this patient's care. Please do not hesitate to give me a call if you have any questions or concerns regarding the treatment plan.   Patient will follow up with me in 6 weeks.     Monica Becton, MD 05/16/2016   1:41 PM Pulmonary and [place] Pager: [phone] Office: [phone], Fax: [phone]

## 2016-05-16 NOTE — Assessment & Plan Note (Signed)
Weight reduction 

## 2016-05-16 NOTE — Assessment & Plan Note (Addendum)
Pt has hypersomnia. Sleeps 6-8 hrs/night. Has frequent awakenings. Has snoring, gasping, choking. Has unrefreshed sleep.  Has hypersomnia which affects fxnality. Falls aslep at break time and lunch time.  Not much commute. (-) abnormal behavior in sleep.   ESS 11.   Plan: Patient has snoring, hypersomnia, witnessed apneas, choking, gasping during sleep.  We discussed about the diagnosis of Obstructive Sleep Apnea (OSA) and implications of untreated OSA. We discussed about CPAP and BiPaP as possible treatment options.   We will schedule the patient for a sleep study. Plan for inlab sleep study. Pt plans on having GB surgery but I advised to hold off unless urgent. Need to Rx OSA first.    Patient was instructed to call the office if he/she has not heard back from the office 1-2 weeks after the sleep study.   Patient was instructed to call the office if he/she is having issues with the PAP device.   We discussed good sleep hygiene.   Patient was advised not to engage in activities requiring concentration and/or vigilance if he/she is sleepy.  Patient was advised not to drive if he/she is sleepy.

## 2016-05-16 NOTE — Patient Instructions (Signed)
It was a pleasure taking care of you today!  We will schedule you to have a sleep study to determine if you have sleep apnea.    We will get a lab sleep study.  You will be scheduled to have a lab sleep study in 4-6 weeks.  Someone from the sleep lab will call you in 2-3 days to schedule the study with you.  They usually have cancellations every night so most likely, they will have openings for a lab sleep study next week or so.  We encourage you to do your sleep study then if possible. Please give [place] a call in a week is no one from the sleep lab calls you in 2-3 days.   If the sleep study is positive, we will order you a CPAP  machine.  Please call the office if you do NOT receive your machine in the next 1-2 weeks.   Please make sure you use your CPAP device everytime you sleep.  We will monitor the usage of your machine per your insurance requirement.  Your insurance company may take the machine from you if you are not using it regularly.   Please clean the mask, tubings, filter, water reservoir with soapy water every week.  Please use distilled water for the water reservoir.   Please call the office or your machine provider (DME company) if you are having issues with the device.   Return to clinic in 6 weeks with Dr. Corrie Dandy or NP.

## 2016-05-17 ENCOUNTER — Ambulatory Visit (INDEPENDENT_AMBULATORY_CARE_PROVIDER_SITE_OTHER): Payer: Federal, State, Local not specified - PPO | Admitting: Obstetrics & Gynecology

## 2016-05-17 ENCOUNTER — Encounter: Payer: Self-pay | Admitting: Obstetrics & Gynecology

## 2016-05-17 VITALS — Ht 64.0 in | Wt 233.0 lb

## 2016-05-17 DIAGNOSIS — Z01419 Encounter for gynecological examination (general) (routine) without abnormal findings: Secondary | ICD-10-CM | POA: Diagnosis not present

## 2016-05-17 DIAGNOSIS — R102 Pelvic and perineal pain: Secondary | ICD-10-CM | POA: Diagnosis not present

## 2016-05-17 DIAGNOSIS — N949 Unspecified condition associated with female genital organs and menstrual cycle: Secondary | ICD-10-CM | POA: Diagnosis not present

## 2016-05-17 DIAGNOSIS — Z124 Encounter for screening for malignant neoplasm of cervix: Secondary | ICD-10-CM | POA: Diagnosis not present

## 2016-05-17 DIAGNOSIS — Z1151 Encounter for screening for human papillomavirus (HPV): Secondary | ICD-10-CM

## 2016-05-17 NOTE — Patient Instructions (Signed)

## 2016-05-17 NOTE — Progress Notes (Signed)
Patient ID: Rhonda Morrow, female   DOB: 1961/01/08, 55 y.o.   MRN: [id number] Subjective:     Rhonda Morrow is a 55 y.o. female here for a routine exam.  LMP >5 years prev.  No bleeding since that time. Current complaints: Pt c/o pelvic pain. Th epain has been present for ~2 months.  She was dx'd with a 'gallbladder issue' Is scheduled for a cholecystectomy. Pt does not know if the pelvic pain is related to her gallbladder issues.  The pain is not present daily.   Sometimes the pain is dull.  Pt thought she felt a lump on the labia.  Pts husband cold not feel.  Pt is not sexually active due to illness in her husband.  Pt denies pain at times when they have 'outercourse'  When pain does occur, it is fleeting. It lasts 71min or less.  Dr. Shirley Muscat   Gynecologic History Patient's last menstrual period was 05/20/2012. Contraception: post menopausal status Last Pap: >10 years. Results were: normal Last mammogram: 11/2015. Results were: normal  Obstetric History OB History  Gravida Para Term Preterm AB SAB TAB Ectopic Multiple Living  3         3    # Outcome Date GA Lbr Len/2nd Weight Sex Delivery Anes PTL Lv  3 Gravida           2 Gravida           1 Gravida              The following portions of the patient's history were reviewed and updated as appropriate: allergies, current medications, past family history, past medical history, past social history, past surgical history and problem list.  Review of Systems Pertinent items are noted in HPI.    Objective:   Ht 5\' 4"  (1.626 m)  Wt 233 lb (105.688 kg)  BMI 39.97 kg/m2  LMP 05/20/2012 General Appearance:    Alert, cooperative, no distress, appears stated age  Head:    Normocephalic, without obvious abnormality, atraumatic  Eyes:    conjunctiva/corneas clear, EOM's intact, both eyes  Ears:    Normal external ear canals, both ears  Nose:   Nares normal, septum midline, mucosa normal, no drainage    or sinus tenderness  Throat:    Lips, mucosa, and tongue normal; teeth and gums normal  Neck:   Supple, symmetrical, trachea midline, no adenopathy;    thyroid:  no enlargement/tenderness/nodules  Back:     Symmetric, no curvature, ROM normal, no CVA tenderness  Lungs:     Clear to auscultation bilaterally, respirations unlabored  Chest Wall:    No tenderness or deformity   Heart:    Regular rate and rhythm, S1 and S2 normal, no murmur, rub   or gallop  Breast Exam:    pendulous breasts; symmetric;  No tenderness, masses, or nipple abnormality  Abdomen:     Soft, non-tender, bowel sounds active all four quadrants,    no masses, no organomegaly  Genitalia:    Normal female without lesion, discharge or tenderness     Extremities:   Extremities normal, atraumatic, no cyanosis or edema  Pulses:   2+ and symmetric all extremities  Skin:   Skin color, texture, turgor normal, no rashes or lesions     Assessment:    Healthy female exam.   Annual with PAP done today Pelvic pain- no etiology discovered on exam- some tenderness in RLQ   Plan:    Follow up in:  4 weeks.    F/u PAP with [facility] Pelvic sono NSAIDS prn  Karon Heckendorn L. Harraway-Smith, M.D., Cherlynn June

## 2016-05-18 LAB — CYTOLOGY - PAP

## 2016-05-18 NOTE — Telephone Encounter (Signed)
Last seen 04/18/16 and filled 02/08/16 #30 with 3 rf No UDS and No contract   Please advise     KP

## 2016-05-19 NOTE — Telephone Encounter (Signed)
Spoke with patient 05/18/16, she is requesting to be out of work until her surgery is scheduled due to symptoms.   Paperwork completed and awaiting signature from Provider.

## 2016-05-19 NOTE — Telephone Encounter (Signed)
Forms have been signed and left up front for pick up, copy sent to scan.  Left message at [phone] informing patient forms are up front.

## 2016-05-19 NOTE — Telephone Encounter (Signed)
Forms filled out and signed.

## 2016-05-22 ENCOUNTER — Ambulatory Visit (HOSPITAL_BASED_OUTPATIENT_CLINIC_OR_DEPARTMENT_OTHER)
Admission: RE | Admit: 2016-05-22 | Discharge: 2016-05-22 | Disposition: A | Discharge status: Home / Self Care | Payer: Federal, State, Local not specified - PPO | Source: Ambulatory Visit | Attending: Obstetrics & Gynecology | Admitting: Obstetrics & Gynecology

## 2016-05-22 DIAGNOSIS — R102 Pelvic and perineal pain: Secondary | ICD-10-CM

## 2016-05-22 DIAGNOSIS — Z01419 Encounter for gynecological examination (general) (routine) without abnormal findings: Secondary | ICD-10-CM

## 2016-05-23 ENCOUNTER — Ambulatory Visit (HOSPITAL_BASED_OUTPATIENT_CLINIC_OR_DEPARTMENT_OTHER)
Admission: RE | Admit: 2016-05-23 | Discharge: 2016-05-23 | Disposition: A | Discharge status: Home / Self Care | Payer: Federal, State, Local not specified - PPO | Source: Ambulatory Visit | Attending: Obstetrics & Gynecology | Admitting: Obstetrics & Gynecology

## 2016-05-23 ENCOUNTER — Encounter: Payer: Self-pay | Admitting: Internal Medicine

## 2016-05-23 ENCOUNTER — Ambulatory Visit (INDEPENDENT_AMBULATORY_CARE_PROVIDER_SITE_OTHER): Payer: Federal, State, Local not specified - PPO | Admitting: Internal Medicine

## 2016-05-23 VITALS — BP 124/72 | HR 82 | Ht 64.0 in | Wt 235.6 lb

## 2016-05-23 DIAGNOSIS — J4540 Moderate persistent asthma, uncomplicated: Secondary | ICD-10-CM

## 2016-05-23 DIAGNOSIS — R102 Pelvic and perineal pain: Secondary | ICD-10-CM | POA: Diagnosis not present

## 2016-05-23 DIAGNOSIS — N854 Malposition of uterus: Secondary | ICD-10-CM | POA: Diagnosis not present

## 2016-05-23 DIAGNOSIS — N949 Unspecified condition associated with female genital organs and menstrual cycle: Secondary | ICD-10-CM | POA: Diagnosis not present

## 2016-05-23 DIAGNOSIS — Z01419 Encounter for gynecological examination (general) (routine) without abnormal findings: Secondary | ICD-10-CM | POA: Insufficient documentation

## 2016-05-23 NOTE — Patient Instructions (Addendum)
ICD-9-CM ICD-10-CM   1. Moderate persistent asthma, uncomplicated 123456 [id number]    Asthma is well controlled and stable  PLAN  - Continue Dulera as before Flu shot in the fall Follow-up with Dr. Murlean Iba for sleep as before  Follow-up  - Return to see me in 6-8 months-

## 2016-05-23 NOTE — Progress Notes (Signed)
Subjective:     Patient ID: Rhonda [facility]row, female   DOB: [birth year], 55 y.o.   MRN: [id number]  HPI  55 year old female never smoker with known asthma Patient is deaf and mute  Test PFt date 01/24/12 shows mixed obstruction - restriction but greater restriction with low dlco  - fev1 1/48L/58%, Ratio 82, TLC 56, DLCO 14/51%, No BD response  CT chest 02/09/2012 showed no evidence of scarring   03/28/2016 follow-up asthma Interpreter present for visit today.  Patient returns for a Two-week follow-up for asthma exacerbation Patient was treated with redness and taper and restarted on [facility] She was set up for a CT chest. This was negative for pulmonary embolism or acute process. There was noted. Left ventricular hypertrophy. Discussed setting up a 2-D echo to evaluate this further there was also notable, gallbladder mildly distention. Have advised her that she needs to follow with her primary care physician regarding this. She remains on Dulera twice daily. Says overall she says cough and wheezing are better but still sob Has stomach complaints of bloating.  Denies chest pain, orthopnea, edema or fever.   OV 05/23/2016  Chief Complaint  Patient presents with  . Follow-up    Pt states she is doing well, notes she has difficulty getting deep breaths at night.      Follow-up asthma not otherwise specified on moderate persistent regimen. She is deaf and mute. Interpreter today is Engineer, civil (consulting). Meeting in for for the first time.  Since the last time I saw her she had CT angiogram chest that ruled out pulmonary embolism and showed normal lung parenchyma. She has seen sleep specialist Dr. Murlean Iba 05/16/2016 and the sleep study has been set up for 05/29/2016. She's finding of the sleep study before a planned cholecystectomy as per his advice. Currently asthma stable. She is on [place]. No problems. No nocturnal awakenings. No chest pain or worsening shortness of breath no wheezing or hemoptysis no  fever no chills   has a past medical history of Hypertension; Deaf; Asthma; Thyroid disease; and Diabetes mellitus without complication ([place]).   reports that she has never smoked. She has never used smokeless tobacco.  Past Surgical History  Procedure Laterality Date  . Cesarean section    . Cardiac catheterization    . Esophageal manometry N/A 09/29/2013    Procedure: ESOPHAGEAL MANOMETRY (EM);  Surgeon: Milus Banister, MD;  Location: [facility];  Service: Endoscopy;  Laterality: N/A;    Allergies  Allergen Reactions  . Losartan Shortness Of Breath  . Augmentin [Amoxicillin-Pot Clavulanate] Diarrhea  . Oxycodone-Acetaminophen Nausea And Vomiting    Immunization History  Administered Date(s) Administered  . DT 04/07/2011  . Influenza Split 08/07/2011, 08/05/2012, 08/22/2012, 08/31/2014  . Influenza,inj,Quad PF,36+ Mos 08/20/2013, 10/22/2015  . Pneumococcal Polysaccharide-23 10/02/2012    Family History  Problem Relation Age of Onset  . Diabetes Mother   . Diabetes Father   . Heart disease Mother      Current outpatient prescriptions:  .  albuterol (PROVENTIL HFA;VENTOLIN HFA) 108 (90 Base) MCG/ACT inhaler, Inhale 2 puffs into the lungs every 6 (six) hours as needed for wheezing., Disp: 1 Inhaler, Rfl: 3 .  amLODipine (NORVASC) 5 MG tablet, TAKE 1 TABLET (5 MG TOTAL) BY MOUTH DAILY., Disp: 30 tablet, Rfl: 6 .  cetirizine (ZYRTEC) 10 MG tablet, Take 1 tablet (10 mg total) by mouth daily., Disp: 30 tablet, Rfl: 11 .  cyclobenzaprine (FLEXERIL) 5 MG tablet, Take 1 tablet (5 mg total) by mouth at bedtime.,  Disp: 7 tablet, Rfl: 0 .  diclofenac (VOLTAREN) 75 MG EC tablet, Take 1 tablet (75 mg total) by mouth 2 (two) times daily., Disp: 60 tablet, Rfl: 1 .  Fiber CHEW, Chew 1 tablet by mouth daily., Disp: , Rfl:  .  fluticasone (FLONASE) 50 MCG/ACT nasal spray, PLACE 2 SPRAYS INTO BOTH NOSTRILS DAILY., Disp: 16 g, Rfl: 4 .  glucose blood test strip, Use as instructed--- one  touch verio test strips, Disp: 100 each, Rfl: 12 .  hydrochlorothiazide (HYDRODIURIL) 12.5 MG tablet, Take 1 tablet (12.5 mg total) by mouth daily., Disp: 90 tablet, Rfl: 1 .  ipratropium-albuterol (DUONEB) 0.5-2.5 (3) MG/3ML SOLN, Take 3 mLs by nebulization every 6 (six) hours as needed., Disp: 360 mL, Rfl: 3 .  meclizine (ANTIVERT) 25 MG tablet, Take 1 tablet (25 mg total) by mouth 3 (three) times daily as needed., Disp: 45 tablet, Rfl: 3 .  metFORMIN (GLUCOPHAGE-XR) 500 MG 24 hr tablet, TAKE 1 TABLET (500 MG TOTAL) BY MOUTH DAILY WITH BREAKFAST., Disp: 90 tablet, Rfl: 1 .  methocarbamol (ROBAXIN) 500 MG tablet, Take 1 tablet (500 mg total) by mouth 2 (two) times daily., Disp: 20 tablet, Rfl: 0 .  mometasone-formoterol (DULERA) 100-5 MCG/ACT AERO, Inhale 2 puffs into the lungs 2 (two) times daily., Disp: 8.8 g, Rfl: 3 .  Multiple Vitamin (MULTIVITAMIN) tablet, Take 1 tablet by mouth daily., Disp: , Rfl:  .  nitroGLYCERIN (NITRODUR - DOSED IN MG/24 HR) 0.2 mg/hr patch, Apply 1/4th patch to affected shoulder, change daily, Disp: 30 patch, Rfl: 1 .  NONFORMULARY OR COMPOUNDED ITEM, Nebulizer   DX ASTHMA, Disp: 1 each, Rfl: 0 .  omeprazole (PRILOSEC) 20 MG capsule, Take 1 capsule (20 mg total) by mouth daily., Disp: 30 capsule, Rfl: 3 .  potassium chloride SA (K-DUR,KLOR-CON) 20 MEQ tablet, Take 1 tablet (20 mEq total) by mouth daily., Disp: 90 tablet, Rfl: 1 .  Spacer/Aero-Holding Chambers (AEROCHAMBER MV) inhaler, Use as instructed, Disp: 1 each, Rfl: 0 .  traMADol (ULTRAM) 50 MG tablet, TAKE 1 TABLET BY MOUTH EVERY 8 HOURS AS NEEDED, Disp: 30 tablet, Rfl: 1     Review of Systems     Objective:   Physical Exam  Constitutional: She is oriented to person, place, and time. She appears well-developed and well-nourished. No distress.  HENT:  Head: Normocephalic and atraumatic.  Right Ear: External ear normal.  Left Ear: External ear normal.  Mouth/Throat: Oropharynx is clear and moist. No  oropharyngeal exudate.  Eyes: Conjunctivae and EOM are normal. Pupils are equal, round, and reactive to light. Right eye exhibits no discharge. Left eye exhibits no discharge. No scleral icterus.  Neck: Normal range of motion. Neck supple. No JVD present. No tracheal deviation present. No thyromegaly present.  Cardiovascular: Normal rate, regular rhythm, normal heart sounds and intact distal pulses.  Exam reveals no gallop and no friction rub.   No murmur heard. Pulmonary/Chest: Effort normal and breath sounds normal. No respiratory distress. She has no wheezes. She has no rales. She exhibits no tenderness.  Abdominal: Soft. Bowel sounds are normal. She exhibits no distension and no mass. There is no tenderness. There is no rebound and no guarding.  Musculoskeletal: Normal range of motion. She exhibits no edema or tenderness.  Lymphadenopathy:    She has no cervical adenopathy.  Neurological: She is alert and oriented to person, place, and time. She has normal reflexes. No cranial nerve deficit. She exhibits normal muscle tone. Coordination normal.  Skin: Skin is  warm and dry. No rash noted. She is not diaphoretic. No erythema. No pallor.  Psychiatric: She has a normal mood and affect. Her behavior is normal. Judgment and thought content normal.  Vitals reviewed.   Filed Vitals:   05/23/16 1454  BP: 124/72  Pulse: 82  Height: 5\' 4"  (1.626 m)  Weight: 235 lb 9.6 oz (106.867 kg)  SpO2: 99%   Body mass index is 40.42 kg/(m^2).      Assessment:       ICD-9-CM ICD-10-CM   1. Moderate persistent asthma, uncomplicated 123456 [id number]        Plan:      Asthma is well controlled and stable  PLAN  - Continue Dulera as before Flu shot in the fall Follow-up with Dr. Murlean Iba for sleep as before and preop assessment  Follow-up  - Return to see me in 6-8 months-       Dr. Brand Males, M.D., [facility].C.P Pulmonary and Critical Care Medicine Staff Physician [facility] Pager: [phone], If no answer or between  15:00h - 7:00h: call [phone]  05/23/2016 3:26 PM

## 2016-05-29 ENCOUNTER — Ambulatory Visit (HOSPITAL_BASED_OUTPATIENT_CLINIC_OR_DEPARTMENT_OTHER): Payer: Federal, State, Local not specified - PPO | Attending: Pulmonary Disease | Admitting: Pulmonary Disease

## 2016-05-29 VITALS — Ht 64.0 in

## 2016-05-29 DIAGNOSIS — I493 Ventricular premature depolarization: Secondary | ICD-10-CM | POA: Diagnosis not present

## 2016-05-29 DIAGNOSIS — G4710 Hypersomnia, unspecified: Secondary | ICD-10-CM | POA: Insufficient documentation

## 2016-05-29 DIAGNOSIS — Z79899 Other long term (current) drug therapy: Secondary | ICD-10-CM | POA: Insufficient documentation

## 2016-05-29 DIAGNOSIS — G4733 Obstructive sleep apnea (adult) (pediatric): Secondary | ICD-10-CM

## 2016-05-29 DIAGNOSIS — R0683 Snoring: Secondary | ICD-10-CM | POA: Diagnosis not present

## 2016-06-04 ENCOUNTER — Telehealth: Payer: Self-pay | Admitting: Pulmonary Disease

## 2016-06-04 DIAGNOSIS — G4710 Hypersomnia, unspecified: Secondary | ICD-10-CM

## 2016-06-04 NOTE — Telephone Encounter (Signed)
    Please call the pt (She is deaf and mute)  and tell the pt the [place] did not show OSA.   Is she remains sleepy and tried, we can get a HST in 1-2 mos. I think she found it hard to sleep during the lab study.   Let me know what she says.  Follow up as needed or after the rpt sleep study.   Thanks!   J. Shirl Harris, MD 06/04/2016, 3:09 PM

## 2016-06-04 NOTE — Procedures (Signed)
NAME: Rhonda Morrow DATE OF BIRTH:  February 06, 1961 MEDICAL RECORD NUMBER [id number]  LOCATION: [facility]  PHYSICIAN: [place] OF STUDY: 05/29/2016   Patient Name: Rhonda Morrow, Rhonda Morrow Date: 05/29/2016   Gender: Female  D.O.B: 15-Feb-1961  Age (years): 54  Referring Provider: [place]   Height (inches): 64  Interpreting Physician: [place]   Weight (lbs): 231  RPSGT: Zadie Rhine   BMI: 40  MRN: [id number]  Neck Size: 16.00    CLINICAL INFORMATION  Sleep Study Type: NPSG  Indication for sleep study: OSA  Epworth Sleepiness Score: 17   SLEEP STUDY TECHNIQUE  As per the AASM Manual for the Scoring of Sleep and Associated Events v2.3 (April 2016) with a hypopnea requiring 4% desaturations.  The channels recorded and monitored were frontal, central and occipital EEG, electrooculogram (EOG), submentalis EMG (chin), nasal and oral airflow, thoracic and abdominal wall motion, anterior tibialis EMG, snore microphone, electrocardiogram, and pulse oximetry.   MEDICATIONS  Patient's medications include: TRAMADOL, ALBUTEROL 108 (90 BASE) MCG/ACT, AEROCHAMBER. Meds reviewed per chart review. Medications self-administered by patient during sleep study : No sleep medicine administered.  SLEEP ARCHITECTURE  The study was initiated at 11:10:40 PM and ended at 4:56:06 AM.  Sleep onset time was 11.3 minutes and the sleep efficiency was 84.7%. The total sleep time was 292.5 minutes.  Stage REM latency was 141.5 minutes.  The patient spent 3.25% of the night in stage N1 sleep, 76.07% in stage N2 sleep, 1.54% in stage N3 and 19.15% in REM.  Alpha intrusion was absent.  Supine sleep was 10.60%.   RESPIRATORY PARAMETERS  The overall apnea/hypopnea index (AHI) was 0.0 per hour. There were 0 total apneas, including 0 obstructive, 0 central and 0 mixed apneas. There were 0 hypopneas and 1 RERAs.  The AHI during Stage REM sleep was 0.0 per  hour. AHI while supine was 0.0 per hour.  The mean oxygen saturation was 96.94%. The minimum SpO2 during sleep was 93.00%.  Soft snoring was noted during this study.  CARDIAC DATA  The 2 lead EKG demonstrated sinus rhythm. The mean heart rate was 77.58 beats per minute. Other EKG findings include: PVCs.   LEG MOVEMENT DATA  The total PLMS were 0 with a resulting PLMS index of 0.00. Associated arousal with leg movement index was 0.0 .  IMPRESSIONS  No significant obstructive sleep apnea occurred during this study (AHI = 0.0/h). Patient was restless throughout the night. No significant central sleep apnea occurred during this study (CAI = 0.0/h). The patient had minimal or no oxygen desaturation during the study (Min O2 = 93.00%) The patient snored with Soft snoring volume. EKG findings include PVCs. Clinically significant periodic limb movements did not occur during sleep. No significant associated arousals.  DIAGNOSIS  No evidence for significant OSA based on this study. Patient had REM non supine sleep during the study. She was restless throughout the night per technician documentation.   RECOMMENDATIONS  If patient's hypersomnia is persistent, suggest getting a home sleep test to cover more hours. As mentioned, patient was restless during the study but was able to sleep nonetheless. Avoid alcohol, sedatives and other CNS depressants that may worsen sleep apnea and disrupt normal sleep architecture. Sleep hygiene should be reviewed to assess factors that may improve sleep quality. Weight management and regular exercise should be initiated or continued if appropriate. Follow up in the office as scheduled.  Monica Becton, MD 06/04/2016, 3:06 PM [facility] Pager [phone] After 3 pm or if no answer, call [phone]

## 2016-06-06 NOTE — Telephone Encounter (Addendum)
Called pt home # (which is through relay service). She is aware of sleep results. She reports she would like the repeat HST in 1-2 months bc she still is waking up very tired in the mornings. Please advise thanks

## 2016-06-07 ENCOUNTER — Other Ambulatory Visit: Payer: Self-pay | Admitting: General Surgery

## 2016-06-07 NOTE — Telephone Encounter (Signed)
  pls schedule f/u with me or any NPs/APPs in 1-2 mos.  During that visit, we can schedule pt for HST.  Thanks.  Monica Becton, MD 06/07/2016, 1:10 AM [facility] Pager [phone] After 3 pm or if no answer, call [phone]

## 2016-06-07 NOTE — Telephone Encounter (Signed)
lmtcb X1 for pt with a family member through interpreter line.  Will hold message in triage as this has been closed.

## 2016-06-07 NOTE — Telephone Encounter (Signed)
Spoke with pt, aware of recs.  rov scheduled with pt.  Nothing further needed.

## 2016-06-09 ENCOUNTER — Ambulatory Visit (INDEPENDENT_AMBULATORY_CARE_PROVIDER_SITE_OTHER): Payer: Federal, State, Local not specified - PPO | Admitting: Family Medicine

## 2016-06-09 ENCOUNTER — Encounter: Payer: Self-pay | Admitting: Family Medicine

## 2016-06-09 VITALS — BP 134/88 | HR 83 | Temp 98.1°F | Wt 228.6 lb

## 2016-06-09 DIAGNOSIS — N23 Unspecified renal colic: Secondary | ICD-10-CM | POA: Diagnosis not present

## 2016-06-09 DIAGNOSIS — R82998 Other abnormal findings in urine: Secondary | ICD-10-CM

## 2016-06-09 DIAGNOSIS — M25511 Pain in right shoulder: Secondary | ICD-10-CM

## 2016-06-09 DIAGNOSIS — H6123 Impacted cerumen, bilateral: Secondary | ICD-10-CM | POA: Diagnosis not present

## 2016-06-09 DIAGNOSIS — N390 Urinary tract infection, site not specified: Secondary | ICD-10-CM

## 2016-06-09 DIAGNOSIS — H6120 Impacted cerumen, unspecified ear: Secondary | ICD-10-CM | POA: Insufficient documentation

## 2016-06-09 LAB — POC URINALSYSI DIPSTICK (AUTOMATED)
Glucose, UA: NEGATIVE
NITRITE UA: NEGATIVE
PH UA: 7.5
RBC UA: NEGATIVE
Spec Grav, UA: 1.015
Urobilinogen, UA: 2

## 2016-06-09 MED ORDER — DICLOFENAC SODIUM 75 MG PO TBEC: 75.0000 mg | DELAYED_RELEASE_TABLET | Freq: Two times a day (BID) | ORAL | 1 refills | Status: DC

## 2016-06-09 MED ORDER — CIPROFLOXACIN HCL 250 MG PO TABS: 250.0000 mg | ORAL_TABLET | Freq: Two times a day (BID) | ORAL | 0 refills | Status: DC

## 2016-06-09 NOTE — Assessment & Plan Note (Signed)
Irrigated successfully rto prn May have caused some of the dizziness

## 2016-06-09 NOTE — Progress Notes (Signed)
Patient ID: Rhonda Morrow, female    DOB: 12/21/1960  Age: 55 y.o. MRN: [id number]    Subjective:  Subjective  HPI Rhonda Morrow presents for back pain and dizziness.  No fever, chills.   Symptoms x few weeks  Review of Systems  Constitutional: Negative for appetite change, diaphoresis, fatigue and unexpected weight change.  Eyes: Negative for pain, redness and visual disturbance.  Respiratory: Negative for cough, chest tightness, shortness of breath and wheezing.   Cardiovascular: Negative for chest pain, palpitations and leg swelling.  Endocrine: Negative for cold intolerance, heat intolerance, polydipsia, polyphagia and polyuria.  Genitourinary: Positive for dysuria. Negative for decreased urine volume, difficulty urinating, frequency, pelvic pain, urgency, vaginal bleeding, vaginal discharge and vaginal pain.  Musculoskeletal: Positive for back pain.  Neurological: Positive for dizziness. Negative for light-headedness, numbness and headaches.    History Past Medical History:  Diagnosis Date  . Asthma   . Deaf   . Diabetes mellitus without complication ([place])   . Hypertension   . Thyroid disease     She has a past surgical history that includes Cesarean section; Cardiac catheterization; and Esophageal manometry (N/A, 09/29/2013).   Her family history includes Diabetes in her father and mother; Heart disease in her mother.She reports that she has never smoked. She has never used smokeless tobacco. She reports that she does not drink alcohol or use drugs.  Current Outpatient Prescriptions on File Prior to Visit  Medication Sig Dispense Refill  . albuterol (PROVENTIL HFA;VENTOLIN HFA) 108 (90 Base) MCG/ACT inhaler Inhale 2 puffs into the lungs every 6 (six) hours as needed for wheezing. 1 Inhaler 3  . amLODipine (NORVASC) 5 MG tablet TAKE 1 TABLET (5 MG TOTAL) BY MOUTH DAILY. 30 tablet 6  . cetirizine (ZYRTEC) 10 MG tablet Take 1 tablet (10 mg total) by mouth daily. 30 tablet 11  .  cyclobenzaprine (FLEXERIL) 5 MG tablet Take 1 tablet (5 mg total) by mouth at bedtime. 7 tablet 0  . Fiber CHEW Chew 1 tablet by mouth daily.    . fluticasone (FLONASE) 50 MCG/ACT nasal spray PLACE 2 SPRAYS INTO BOTH NOSTRILS DAILY. 16 g 4  . glucose blood test strip Use as instructed--- one touch verio test strips 100 each 12  . hydrochlorothiazide (HYDRODIURIL) 12.5 MG tablet Take 1 tablet (12.5 mg total) by mouth daily. 90 tablet 1  . ipratropium-albuterol (DUONEB) 0.5-2.5 (3) MG/3ML SOLN Take 3 mLs by nebulization every 6 (six) hours as needed. 360 mL 3  . meclizine (ANTIVERT) 25 MG tablet Take 1 tablet (25 mg total) by mouth 3 (three) times daily as needed. 45 tablet 3  . metFORMIN (GLUCOPHAGE-XR) 500 MG 24 hr tablet TAKE 1 TABLET (500 MG TOTAL) BY MOUTH DAILY WITH BREAKFAST. 90 tablet 1  . methocarbamol (ROBAXIN) 500 MG tablet Take 1 tablet (500 mg total) by mouth 2 (two) times daily. 20 tablet 0  . mometasone-formoterol (DULERA) 100-5 MCG/ACT AERO Inhale 2 puffs into the lungs 2 (two) times daily. 8.8 g 3  . Multiple Vitamin (MULTIVITAMIN) tablet Take 1 tablet by mouth daily.    . nitroGLYCERIN (NITRODUR - DOSED IN MG/24 HR) 0.2 mg/hr patch Apply 1/4th patch to affected shoulder, change daily 30 patch 1  . NONFORMULARY OR COMPOUNDED ITEM Nebulizer   DX ASTHMA 1 each 0  . omeprazole (PRILOSEC) 20 MG capsule Take 1 capsule (20 mg total) by mouth daily. 30 capsule 3  . potassium chloride SA (K-DUR,KLOR-CON) 20 MEQ tablet Take 1 tablet (20 mEq total)  by mouth daily. 90 tablet 1  . Spacer/Aero-Holding Chambers (AEROCHAMBER MV) inhaler Use as instructed 1 each 0  . traMADol (ULTRAM) 50 MG tablet TAKE 1 TABLET BY MOUTH EVERY 8 HOURS AS NEEDED 30 tablet 1   No current facility-administered medications on file prior to visit.      Objective:  Objective  Physical Exam  Constitutional: She is oriented to person, place, and time. She appears well-developed and well-nourished.  HENT:  Head:  Normocephalic and atraumatic.  Cerumen b/l --- unable to get out with hoop                     Irrigated with no complications   Eyes: Conjunctivae and EOM are normal.  Neck: Normal range of motion. Neck supple. No JVD present. Carotid bruit is not present. No thyromegaly present.  Cardiovascular: Normal rate, regular rhythm and normal heart sounds.   No murmur heard. Pulmonary/Chest: Effort normal and breath sounds normal. No respiratory distress. She has no wheezes. She has no rales. She exhibits no tenderness.  Musculoskeletal: She exhibits no edema.  Neurological: She is alert and oriented to person, place, and time.  Psychiatric: She has a normal mood and affect.  Nursing note and vitals reviewed.  BP 134/88 (BP Location: Left Arm, Patient Position: Sitting, Cuff Size: Normal)   Pulse 83   Temp 98.1 F (36.7 C) (Oral)   Wt 228 lb 9.6 oz (103.7 kg)   LMP 05/20/2012   SpO2 98%   BMI 39.24 kg/m  Wt Readings from Last 3 Encounters:  06/09/16 228 lb 9.6 oz (103.7 kg)  05/23/16 235 lb 9.6 oz (106.9 kg)  05/17/16 233 lb (105.7 kg)     Lab Results  Component Value Date   WBC 7.1 04/24/2016   HGB 13.2 04/24/2016   HCT 40.1 04/24/2016   PLT 243.0 04/24/2016   GLUCOSE 93 04/24/2016   CHOL 148 04/24/2016   TRIG 84.0 04/24/2016   HDL 52.20 04/24/2016   LDLCALC 79 04/24/2016   ALT 18 04/24/2016   AST 15 04/24/2016   NA 140 04/24/2016   K 3.7 04/24/2016   CL 102 04/24/2016   CREATININE 0.92 04/24/2016   BUN 15 04/24/2016   CO2 30 04/24/2016   TSH 0.81 04/24/2016   INR 1.00 12/30/2012   HGBA1C 6.4 04/24/2016   MICROALBUR 0.8 10/27/2015    US Transvaginal Non-ob  Result Date: 05/23/2016 CLINICAL DATA:  Intermittent sharp pelvic pain for 2 months. EXAM: TRANSABDOMINAL AND TRANSVAGINAL ULTRASOUND OF PELVIS TECHNIQUE: Both transabdominal and transvaginal ultrasound examinations of the pelvis were performed. Transabdominal technique was performed for global imaging of the  pelvis including uterus, ovaries, adnexal regions, and pelvic cul-de-sac. It was necessary to proceed with endovaginal exam following the transabdominal exam to visualize the the uterus and adnexae in better detail. COMPARISON:  09/01/2015 CT FINDINGS: Uterus Measurements: 5.8 x 3.8 x 4.87. Retroverted position. Scattered small hypoechoic solid uterine fibroids noted, at least 3 demonstrated. These all measure 1.8 cm or less in greatest dimension. No other significant abnormality. Endometrium Thickness: 2.7 mm.  No focal abnormality visualized. Right ovary Measurements: 2.0 x 1.5 x 1.6 cm. Normal appearance/no adnexal mass. Left ovary Measurements: 2.9 x 1.2 x 1.9 cm. Normal appearance/no adnexal mass. Other findings No abnormal free fluid. IMPRESSION: Incidental small uterine fibroids all measuring 1.8 cm or less. Retroverted uterus. Normal endometrium and ovaries. No free fluid. Electronically Signed   By: Jerilynn Mages.  Shick M.D.   On: 05/23/2016 21:50  US Pelvis Complete  Result Date: 05/23/2016 CLINICAL DATA:  Intermittent sharp pelvic pain for 2 months. EXAM: TRANSABDOMINAL AND TRANSVAGINAL ULTRASOUND OF PELVIS TECHNIQUE: Both transabdominal and transvaginal ultrasound examinations of the pelvis were performed. Transabdominal technique was performed for global imaging of the pelvis including uterus, ovaries, adnexal regions, and pelvic cul-de-sac. It was necessary to proceed with endovaginal exam following the transabdominal exam to visualize the the uterus and adnexae in better detail. COMPARISON:  09/01/2015 CT FINDINGS: Uterus Measurements: 5.8 x 3.8 x 4.87. Retroverted position. Scattered small hypoechoic solid uterine fibroids noted, at least 3 demonstrated. These all measure 1.8 cm or less in greatest dimension. No other significant abnormality. Endometrium Thickness: 2.7 mm.  No focal abnormality visualized. Right ovary Measurements: 2.0 x 1.5 x 1.6 cm. Normal appearance/no adnexal mass. Left ovary  Measurements: 2.9 x 1.2 x 1.9 cm. Normal appearance/no adnexal mass. Other findings No abnormal free fluid. IMPRESSION: Incidental small uterine fibroids all measuring 1.8 cm or less. Retroverted uterus. Normal endometrium and ovaries. No free fluid. Electronically Signed   By: Jerilynn Mages.  Shick M.D.   On: 05/23/2016 21:50     Assessment & Plan:  Plan  I am having Ms. Flynt start on ciprofloxacin. I am also having her maintain her meclizine, multivitamin, Fiber, methocarbamol, nitroGLYCERIN, cyclobenzaprine, fluticasone, cetirizine, hydrochlorothiazide, ipratropium-albuterol, glucose blood, NONFORMULARY OR COMPOUNDED ITEM, albuterol, mometasone-formoterol, potassium chloride SA, AEROCHAMBER MV, omeprazole, metFORMIN, amLODipine, traMADol, and diclofenac.  Meds ordered this encounter  Medications  . diclofenac (VOLTAREN) 75 MG EC tablet    Sig: Take 1 tablet (75 mg total) by mouth 2 (two) times daily.    Dispense:  60 tablet    Refill:  1  . ciprofloxacin (CIPRO) 250 MG tablet    Sig: Take 1 tablet (250 mg total) by mouth 2 (two) times daily.    Dispense:  6 tablet    Refill:  0    Problem List Items Addressed This Visit      Unprioritized   Cerumen impaction    Irrigated successfully rto prn May have caused some of the dizziness       Other Visit Diagnoses    Kidney pain    -  Primary   Relevant Medications   ciprofloxacin (CIPRO) 250 MG tablet   Other Relevant Orders   POCT Urinalysis Dipstick (Automated) (Completed)   Leukocytes in urine       Relevant Orders   Urine Culture   Pain in joint of right shoulder       Relevant Medications   diclofenac (VOLTAREN) 75 MG EC tablet      Follow-up: Return for as scheduled.  Ann Held, DO

## 2016-06-09 NOTE — Progress Notes (Signed)
Pre visit review using our clinic review tool, if applicable. No additional management support is needed unless otherwise documented below in the visit note. 

## 2016-06-09 NOTE — Patient Instructions (Signed)

## 2016-06-10 LAB — URINE CULTURE: Organism ID, Bacteria: 10000

## 2016-06-12 ENCOUNTER — Encounter: Payer: Self-pay | Admitting: Obstetrics & Gynecology

## 2016-06-12 ENCOUNTER — Ambulatory Visit (INDEPENDENT_AMBULATORY_CARE_PROVIDER_SITE_OTHER): Payer: Federal, State, Local not specified - PPO | Admitting: Obstetrics & Gynecology

## 2016-06-12 VITALS — BP 143/67 | HR 76 | Wt 223.0 lb

## 2016-06-12 DIAGNOSIS — R030 Elevated blood-pressure reading, without diagnosis of hypertension: Secondary | ICD-10-CM

## 2016-06-12 DIAGNOSIS — R102 Pelvic and perineal pain: Secondary | ICD-10-CM

## 2016-06-12 DIAGNOSIS — IMO0001 Reserved for inherently not codable concepts without codable children: Secondary | ICD-10-CM

## 2016-06-12 NOTE — Patient Instructions (Signed)

## 2016-06-12 NOTE — Progress Notes (Signed)
History:  55 y.o. G3P0 here today for f/u of pelvic pain.  Pt reports that the pain is intermittent.  Pt was supposed to take NSAIDS but did not as she got a UTI and was worried that the NSAIDs would interfere with her tx of the UTI.   The following portions of the patient's history were reviewed and updated as appropriate: allergies, current medications, past family history, past medical history, past social history, past surgical history and problem list.  Review of Systems:  Pertinent items are noted in HPI.  Objective:  Physical Exam Blood pressure (!) 143/67, pulse 76, weight 223 lb (101.2 kg), last menstrual period 05/20/2012. Pt in NAD exam deferred   Labs and Imaging US Transvaginal Non-ob  Result Date: 05/23/2016 CLINICAL DATA:  Intermittent sharp pelvic pain for 2 months. EXAM: TRANSABDOMINAL AND TRANSVAGINAL ULTRASOUND OF PELVIS TECHNIQUE: Both transabdominal and transvaginal ultrasound examinations of the pelvis were performed. Transabdominal technique was performed for global imaging of the pelvis including uterus, ovaries, adnexal regions, and pelvic cul-de-sac. It was necessary to proceed with endovaginal exam following the transabdominal exam to visualize the the uterus and adnexae in better detail. COMPARISON:  09/01/2015 CT FINDINGS: Uterus Measurements: 5.8 x 3.8 x 4.87. Retroverted position. Scattered small hypoechoic solid uterine fibroids noted, at least 3 demonstrated. These all measure 1.8 cm or less in greatest dimension. No other significant abnormality. Endometrium Thickness: 2.7 mm.  No focal abnormality visualized. Right ovary Measurements: 2.0 x 1.5 x 1.6 cm. Normal appearance/no adnexal mass. Left ovary Measurements: 2.9 x 1.2 x 1.9 cm. Normal appearance/no adnexal mass. Other findings No abnormal free fluid. IMPRESSION: Incidental small uterine fibroids all measuring 1.8 cm or less. Retroverted uterus. Normal endometrium and ovaries. No free fluid. Electronically  Signed   By: Jerilynn Mages.  Shick M.D.   On: 05/23/2016 21:50   US Pelvis Complete  Result Date: 05/23/2016 CLINICAL DATA:  Intermittent sharp pelvic pain for 2 months. EXAM: TRANSABDOMINAL AND TRANSVAGINAL ULTRASOUND OF PELVIS TECHNIQUE: Both transabdominal and transvaginal ultrasound examinations of the pelvis were performed. Transabdominal technique was performed for global imaging of the pelvis including uterus, ovaries, adnexal regions, and pelvic cul-de-sac. It was necessary to proceed with endovaginal exam following the transabdominal exam to visualize the the uterus and adnexae in better detail. COMPARISON:  09/01/2015 CT FINDINGS: Uterus Measurements: 5.8 x 3.8 x 4.87. Retroverted position. Scattered small hypoechoic solid uterine fibroids noted, at least 3 demonstrated. These all measure 1.8 cm or less in greatest dimension. No other significant abnormality. Endometrium Thickness: 2.7 mm.  No focal abnormality visualized. Right ovary Measurements: 2.0 x 1.5 x 1.6 cm. Normal appearance/no adnexal mass. Left ovary Measurements: 2.9 x 1.2 x 1.9 cm. Normal appearance/no adnexal mass. Other findings No abnormal free fluid. IMPRESSION: Incidental small uterine fibroids all measuring 1.8 cm or less. Retroverted uterus. Normal endometrium and ovaries. No free fluid. Electronically Signed   By: Jerilynn Mages.  Shick M.D.   On: 05/23/2016 21:50    Assessment & Plan:  Pelvic pain- sx sl improved since last visit. No intervention at present  Reviewed pelvic US report.  All questions answered.   rec Alleve prn F/u prn continued or worsening pain If pain relieved f/u in 1 year.  Elevated BP Pt with a h/o HTN.  On meds.  Followed by primary care   Pt is hearing impaired and interpreter used for entire visit.  Jaxie Racanelli L. Harraway-Smith, M.D., Cherlynn June

## 2016-06-13 ENCOUNTER — Ambulatory Visit (INDEPENDENT_AMBULATORY_CARE_PROVIDER_SITE_OTHER): Payer: Federal, State, Local not specified - PPO | Admitting: Family Medicine

## 2016-06-13 VITALS — BP 120/86 | HR 80 | Temp 97.6°F | Wt 230.4 lb

## 2016-06-13 DIAGNOSIS — M5440 Lumbago with sciatica, unspecified side: Secondary | ICD-10-CM | POA: Diagnosis not present

## 2016-06-13 DIAGNOSIS — R102 Pelvic and perineal pain: Secondary | ICD-10-CM | POA: Diagnosis not present

## 2016-06-13 LAB — POC URINALSYSI DIPSTICK (AUTOMATED)
BILIRUBIN UA: NEGATIVE
Blood, UA: NEGATIVE
Glucose, UA: NEGATIVE
KETONES UA: NEGATIVE
LEUKOCYTES UA: NEGATIVE
Nitrite, UA: NEGATIVE
Protein, UA: NEGATIVE
Spec Grav, UA: >=1.03
Urobilinogen, UA: 2
pH, UA: 6

## 2016-06-13 MED ORDER — TRAMADOL HCL 50 MG PO TABS: 50.0000 mg | ORAL_TABLET | Freq: Three times a day (TID) | ORAL | 1 refills | Status: DC | PRN

## 2016-06-13 NOTE — Progress Notes (Signed)
Patient ID: Rhonda Morrow, female    DOB: 01/10/1961  Age: 55 y.o. MRN: [id number]    Subjective:  Subjective  HPI Haze Madan presents for back pain.  L hip pain that radiates to L groin --- pain 10/10 especially at night with movement-- no known injury  Review of Systems  Constitutional: Positive for fatigue. Negative for activity change, appetite change and unexpected weight change.  Respiratory: Negative for cough and shortness of breath.   Cardiovascular: Negative for chest pain and palpitations.  Musculoskeletal: Positive for back pain. Negative for arthralgias and gait problem.  Psychiatric/Behavioral: Negative for behavioral problems and dysphoric mood. The patient is not nervous/anxious.     History Past Medical History:  Diagnosis Date  . Asthma   . Deaf   . Diabetes mellitus without complication ([place])   . Hypertension   . Thyroid disease     She has a past surgical history that includes Cesarean section; Cardiac catheterization; and Esophageal manometry (N/A, 09/29/2013).   Her family history includes Diabetes in her father and mother; Heart disease in her mother.She reports that she has never smoked. She has never used smokeless tobacco. She reports that she does not drink alcohol or use drugs.  Current Outpatient Prescriptions on File Prior to Visit  Medication Sig Dispense Refill  . albuterol (PROVENTIL HFA;VENTOLIN HFA) 108 (90 Base) MCG/ACT inhaler Inhale 2 puffs into the lungs every 6 (six) hours as needed for wheezing. 1 Inhaler 3  . amLODipine (NORVASC) 5 MG tablet TAKE 1 TABLET (5 MG TOTAL) BY MOUTH DAILY. 30 tablet 6  . cetirizine (ZYRTEC) 10 MG tablet Take 1 tablet (10 mg total) by mouth daily. 30 tablet 11  . cyclobenzaprine (FLEXERIL) 5 MG tablet Take 1 tablet (5 mg total) by mouth at bedtime. 7 tablet 0  . diclofenac (VOLTAREN) 75 MG EC tablet Take 1 tablet (75 mg total) by mouth 2 (two) times daily. 60 tablet 1  . Fiber CHEW Chew 1 tablet by mouth daily.      . fluticasone (FLONASE) 50 MCG/ACT nasal spray PLACE 2 SPRAYS INTO BOTH NOSTRILS DAILY. 16 g 4  . glucose blood test strip Use as instructed--- one touch verio test strips 100 each 12  . hydrochlorothiazide (HYDRODIURIL) 12.5 MG tablet Take 1 tablet (12.5 mg total) by mouth daily. 90 tablet 1  . ipratropium-albuterol (DUONEB) 0.5-2.5 (3) MG/3ML SOLN Take 3 mLs by nebulization every 6 (six) hours as needed. 360 mL 3  . meclizine (ANTIVERT) 25 MG tablet Take 1 tablet (25 mg total) by mouth 3 (three) times daily as needed. 45 tablet 3  . metFORMIN (GLUCOPHAGE-XR) 500 MG 24 hr tablet TAKE 1 TABLET (500 MG TOTAL) BY MOUTH DAILY WITH BREAKFAST. 90 tablet 1  . methocarbamol (ROBAXIN) 500 MG tablet Take 1 tablet (500 mg total) by mouth 2 (two) times daily. 20 tablet 0  . mometasone-formoterol (DULERA) 100-5 MCG/ACT AERO Inhale 2 puffs into the lungs 2 (two) times daily. 8.8 g 3  . Multiple Vitamin (MULTIVITAMIN) tablet Take 1 tablet by mouth daily.    . nitroGLYCERIN (NITRODUR - DOSED IN MG/24 HR) 0.2 mg/hr patch Apply 1/4th patch to affected shoulder, change daily 30 patch 1  . NONFORMULARY OR COMPOUNDED ITEM Nebulizer   DX ASTHMA 1 each 0  . omeprazole (PRILOSEC) 20 MG capsule Take 1 capsule (20 mg total) by mouth daily. 30 capsule 3  . potassium chloride SA (K-DUR,KLOR-CON) 20 MEQ tablet Take 1 tablet (20 mEq total) by mouth daily. [place]  tablet 1  . Spacer/Aero-Holding Chambers (AEROCHAMBER MV) inhaler Use as instructed 1 each 0   No current facility-administered medications on file prior to visit.      Objective:  Objective  Physical Exam  Constitutional: She is oriented to person, place, and time. She appears well-developed and well-nourished.  HENT:  Head: Normocephalic and atraumatic.  Eyes: Conjunctivae and EOM are normal.  Neck: Normal range of motion. Neck supple. No JVD present. Carotid bruit is not present. No thyromegaly present.  Cardiovascular: Normal rate, regular rhythm and normal  heart sounds.   No murmur heard. Pulmonary/Chest: Effort normal and breath sounds normal. No respiratory distress. She has no wheezes. She has no rales. She exhibits no tenderness.  Musculoskeletal: She exhibits tenderness. She exhibits no edema.       Arms: Neurological: She is alert and oriented to person, place, and time.  Psychiatric: She has a normal mood and affect.  Nursing note and vitals reviewed.  BP 120/86 (BP Location: Right Arm, Patient Position: Sitting, Cuff Size: Normal)   Pulse 80   Temp 97.6 F (36.4 C) (Oral)   Wt 230 lb 6.4 oz (104.5 kg)   LMP 05/20/2012   SpO2 98%   BMI 39.55 kg/m  Wt Readings from Last 3 Encounters:  06/13/16 230 lb 6.4 oz (104.5 kg)  06/12/16 223 lb (101.2 kg)  06/09/16 228 lb 9.6 oz (103.7 kg)     Lab Results  Component Value Date   WBC 7.1 04/24/2016   HGB 13.2 04/24/2016   HCT 40.1 04/24/2016   PLT 243.0 04/24/2016   GLUCOSE 93 04/24/2016   CHOL 148 04/24/2016   TRIG 84.0 04/24/2016   HDL 52.20 04/24/2016   LDLCALC 79 04/24/2016   ALT 18 04/24/2016   AST 15 04/24/2016   NA 140 04/24/2016   K 3.7 04/24/2016   CL 102 04/24/2016   CREATININE 0.92 04/24/2016   BUN 15 04/24/2016   CO2 30 04/24/2016   TSH 0.81 04/24/2016   INR 1.00 12/30/2012   HGBA1C 6.4 04/24/2016   MICROALBUR 0.8 10/27/2015    US Transvaginal Non-ob  Result Date: 05/23/2016 CLINICAL DATA:  Intermittent sharp pelvic pain for 2 months. EXAM: TRANSABDOMINAL AND TRANSVAGINAL ULTRASOUND OF PELVIS TECHNIQUE: Both transabdominal and transvaginal ultrasound examinations of the pelvis were performed. Transabdominal technique was performed for global imaging of the pelvis including uterus, ovaries, adnexal regions, and pelvic cul-de-sac. It was necessary to proceed with endovaginal exam following the transabdominal exam to visualize the the uterus and adnexae in better detail. COMPARISON:  09/01/2015 CT FINDINGS: Uterus Measurements: 5.8 x 3.8 x 4.87. Retroverted  position. Scattered small hypoechoic solid uterine fibroids noted, at least 3 demonstrated. These all measure 1.8 cm or less in greatest dimension. No other significant abnormality. Endometrium Thickness: 2.7 mm.  No focal abnormality visualized. Right ovary Measurements: 2.0 x 1.5 x 1.6 cm. Normal appearance/no adnexal mass. Left ovary Measurements: 2.9 x 1.2 x 1.9 cm. Normal appearance/no adnexal mass. Other findings No abnormal free fluid. IMPRESSION: Incidental small uterine fibroids all measuring 1.8 cm or less. Retroverted uterus. Normal endometrium and ovaries. No free fluid. Electronically Signed   By: Jerilynn Mages.  Shick M.D.   On: 05/23/2016 21:50   US Pelvis Complete  Result Date: 05/23/2016 CLINICAL DATA:  Intermittent sharp pelvic pain for 2 months. EXAM: TRANSABDOMINAL AND TRANSVAGINAL ULTRASOUND OF PELVIS TECHNIQUE: Both transabdominal and transvaginal ultrasound examinations of the pelvis were performed. Transabdominal technique was performed for global imaging of the pelvis including uterus,  ovaries, adnexal regions, and pelvic cul-de-sac. It was necessary to proceed with endovaginal exam following the transabdominal exam to visualize the the uterus and adnexae in better detail. COMPARISON:  09/01/2015 CT FINDINGS: Uterus Measurements: 5.8 x 3.8 x 4.87. Retroverted position. Scattered small hypoechoic solid uterine fibroids noted, at least 3 demonstrated. These all measure 1.8 cm or less in greatest dimension. No other significant abnormality. Endometrium Thickness: 2.7 mm.  No focal abnormality visualized. Right ovary Measurements: 2.0 x 1.5 x 1.6 cm. Normal appearance/no adnexal mass. Left ovary Measurements: 2.9 x 1.2 x 1.9 cm. Normal appearance/no adnexal mass. Other findings No abnormal free fluid. IMPRESSION: Incidental small uterine fibroids all measuring 1.8 cm or less. Retroverted uterus. Normal endometrium and ovaries. No free fluid. Electronically Signed   By: Jerilynn Mages.  Shick M.D.   On: 05/23/2016  21:50     Assessment & Plan:  Plan  I have discontinued Ms. Colunga's ciprofloxacin. I have also changed her traMADol. Additionally, I am having her maintain her meclizine, multivitamin, Fiber, methocarbamol, nitroGLYCERIN, cyclobenzaprine, fluticasone, cetirizine, hydrochlorothiazide, ipratropium-albuterol, glucose blood, NONFORMULARY OR COMPOUNDED ITEM, albuterol, mometasone-formoterol, potassium chloride SA, AEROCHAMBER MV, omeprazole, metFORMIN, amLODipine, and diclofenac.  Meds ordered this encounter  Medications  . traMADol (ULTRAM) 50 MG tablet    Sig: Take 1 tablet (50 mg total) by mouth every 8 (eight) hours as needed.    Dispense:  30 tablet    Refill:  1    Not to exceed 2 additional fills before 08/06/2016    Problem List Items Addressed This Visit      Unprioritized   Back pain   Relevant Medications   traMADol (ULTRAM) 50 MG tablet   Other Relevant Orders   MR Lumbar Spine Wo Contrast    Other Visit Diagnoses    Pelvic pain in female    -  Primary   Relevant Orders   POCT Urinalysis Dipstick (Automated) (Completed)   Urine Culture      Follow-up: Return in about 4 weeks (around 07/11/2016), or back pain.  Ann Held, DO

## 2016-06-13 NOTE — Patient Instructions (Signed)

## 2016-06-14 ENCOUNTER — Encounter: Payer: Self-pay | Admitting: Family Medicine

## 2016-06-15 ENCOUNTER — Encounter (HOSPITAL_COMMUNITY)
Admission: RE | Admit: 2016-06-15 | Discharge: 2016-06-15 | Disposition: A | Discharge status: Home / Self Care | Payer: Federal, State, Local not specified - PPO | Source: Ambulatory Visit | Attending: General Surgery | Admitting: General Surgery

## 2016-06-15 ENCOUNTER — Encounter (HOSPITAL_COMMUNITY): Payer: Self-pay

## 2016-06-15 DIAGNOSIS — M5440 Lumbago with sciatica, unspecified side: Secondary | ICD-10-CM | POA: Diagnosis not present

## 2016-06-15 DIAGNOSIS — M8938 Hypertrophy of bone, other site: Secondary | ICD-10-CM | POA: Diagnosis not present

## 2016-06-15 HISTORY — DX: Adverse effect of unspecified anesthetic, initial encounter: T41.45XA

## 2016-06-15 HISTORY — DX: Gastro-esophageal reflux disease without esophagitis: K21.9

## 2016-06-15 HISTORY — DX: Left lower quadrant pain: R10.32

## 2016-06-15 HISTORY — DX: Other complications of anesthesia, initial encounter: T88.59XA

## 2016-06-15 LAB — CBC WITH DIFFERENTIAL/PLATELET
BASOS ABS: 0 10*3/uL (ref 0.0–0.1)
BASOS PCT: 0 %
Eosinophils Absolute: 0.3 10*3/uL (ref 0.0–0.7)
Eosinophils Relative: 4 %
HEMATOCRIT: 41.9 % (ref 36.0–46.0)
HEMOGLOBIN: 13.1 g/dL (ref 12.0–15.0)
LYMPHS PCT: 39 %
Lymphs Abs: 2.6 10*3/uL (ref 0.7–4.0)
MCH: 26.8 pg (ref 26.0–34.0)
MCHC: 31.3 g/dL (ref 30.0–36.0)
MCV: 85.7 fL (ref 78.0–100.0)
MONO ABS: 0.5 10*3/uL (ref 0.1–1.0)
Monocytes Relative: 7 %
NEUTROS ABS: 3.4 10*3/uL (ref 1.7–7.7)
NEUTROS PCT: 50 %
Platelets: 220 10*3/uL (ref 150–400)
RBC: 4.89 MIL/uL (ref 3.87–5.11)
RDW: 13.4 % (ref 11.5–15.5)
WBC: 6.8 10*3/uL (ref 4.0–10.5)

## 2016-06-15 LAB — COMPREHENSIVE METABOLIC PANEL
ALBUMIN: 3.5 g/dL (ref 3.5–5.0)
ALK PHOS: 71 U/L (ref 38–126)
ALT: 20 U/L (ref 14–54)
AST: 22 U/L (ref 15–41)
Anion gap: 8 (ref 5–15)
BILIRUBIN TOTAL: 0.6 mg/dL (ref 0.3–1.2)
BUN: 12 mg/dL (ref 6–20)
CALCIUM: 9.2 mg/dL (ref 8.9–10.3)
CO2: 28 mmol/L (ref 22–32)
CREATININE: 0.91 mg/dL (ref 0.44–1.00)
Chloride: 102 mmol/L (ref 101–111)
GFR calc Af Amer: >60 mL/min (ref >60)
GLUCOSE: 94 mg/dL (ref 65–99)
POTASSIUM: 3.3 mmol/L — AB (ref 3.5–5.1)
Sodium: 138 mmol/L (ref 135–145)
TOTAL PROTEIN: 6.8 g/dL (ref 6.5–8.1)

## 2016-06-15 LAB — URINE CULTURE

## 2016-06-15 LAB — GLUCOSE, CAPILLARY: GLUCOSE-CAPILLARY: 98 mg/dL (ref 65–99)

## 2016-06-15 NOTE — Pre-Procedure Instructions (Signed)
Rhonda Morrow  06/15/2016      [id number] - [place], [place] [address] [place] [address] Phone: [phone] Fax: [phone]  [id number] - [place], [place] [place] [address] Phone: [phone] Fax: [phone]    Your procedure is scheduled on Aug. 15  Report to [facility] at 10:50 A.M.  Call this number if you have problems the morning of surgery:  [phone]   Remember:   Do not eat food or drink liquids after midnight.   Take these medicines the morning of surgery with A SIP OF WATER: albuterol inhaler if needed-bring to hospital, amlodipine(norvasc),zyrtec,flonase nasal spray, duoneb neb. If needed, antivert if needed, robaxin if needed, dulera-bring to hospital, omeprazole (prilosec), tramadol if needed             Stop NSAIDS: advil, motrin, aleve, BC powder, goody's      How to Manage Your Diabetes Before and After Surgery  Why is it important to control my blood sugar before and after surgery? . Improving blood sugar levels before and after surgery helps healing and can limit problems. . A way of improving blood sugar control is eating a healthy diet by: o  Eating less sugar and carbohydrates o  Increasing activity/exercise o  Talking with your doctor about reaching your blood sugar goals . High blood sugars (greater than 180 mg/dL) can raise your risk of infections and slow your recovery, so you will need to focus on controlling your diabetes during the weeks before surgery. . Make sure that the doctor who takes care of your diabetes knows about your planned surgery including the date and location.  How do I manage my blood sugar before surgery? . Check your blood sugar at least 4 times a day, starting 2 days before surgery, to make sure that the level is not too high or low. o Check your blood sugar the morning of your surgery when you wake up  and every 2 hours until you get to the Short Stay unit. . If your blood sugar is less than 70 mg/dL, you will need to treat for low blood sugar: o Do not take insulin. o Treat a low blood sugar (less than 70 mg/dL) with  cup of clear juice (cranberry or apple), 4 glucose tablets, OR glucose gel. o Recheck blood sugar in 15 minutes after treatment (to make sure it is greater than 70 mg/dL). If your blood sugar is not greater than 70 mg/dL on recheck, call [phone] for further instructions. . Report your blood sugar to the short stay nurse when you get to Short Stay.  . If you are admitted to the hospital after surgery: o Your blood sugar will be checked by the staff and you will probably be given insulin after surgery (instead of oral diabetes medicines) to make sure you have good blood sugar levels. o The goal for blood sugar control after surgery is 80-180 mg/dL.              WHAT DO I DO ABOUT MY DIABETES MEDICATION?   Marland Kitchen Do not take oral diabetes medicines (pills) the morning of surgery.      Do not wear jewelry, make-up or nail polish.  Do not wear lotions, powders, or perfumes.  You may not  wear deoderant.  Do not shave 48 hours prior to surgery.  Men may shave face and neck.  Do not bring valuables  to the hospital.  [facility] is not responsible for any belongings or valuables.  Contacts, dentures or bridgework may not be worn into surgery.  Leave your suitcase in the car.  After surgery it may be brought to your room.  For patients admitted to the hospital, discharge time will be determined by your treatment team.   Special instructions: review preparing for surgery  Please read over the following fact sheets that you were given. Coughing and Deep Breathing

## 2016-06-15 NOTE — Progress Notes (Signed)
PCP: Dr. Nida Boatman chase   Pt. Doesn't know fasting sugars,doesn't check unless she feels low. States she runs 80-110.  states she had stress test/ echo for preparation of surgery. Studies in epic. Pt. Doesn't know the cardiologist who performed the test.  Pt. Is deaf and sign language interpreter is set up [place] per [place].

## 2016-06-16 NOTE — Progress Notes (Signed)
Anesthesia Chart Review:  Pt is DEAF and requires sign language interpreter  PCP is [name], MD who has cleared pt for surgery.   Pt is a 55 year old female scheduled for laparoscopic cholecystectomy with intraoperative cholangiogram on 06/20/2016 with Rolm Bookbinder, MD.   PMH includes:  HTN, DM, asthma, thyroid disease, GERD. Never smoker. BMI 40.   Medications include: albuterol, amlodipine, hctz, duoneb, metformin, dulera, prilosec, potassium  Preoperative labs reviewed.    chest X-ray 02/21/16: No active cardiopulmonary disease.   EKG 04/24/16: Sinus  Rhythm. Low voltage in precordial leads.   Nuclear stress test 05/10/16:   Nuclear stress EF: 59%.  There was no ST segment deviation noted during stress.  The study is normal.  This is a low risk study.  The left ventricular ejection fraction is normal (55-65%). Apical thinning not thought to be significant No ischemia or infarction  EF 59%  Echo 04/12/16:  - Left ventricle: The cavity size was normal. Wall thickness was normal. Systolic function was normal. The estimated ejection fraction was in the range of 60% to 65%. Wall motion was normal; there were no regional wall motion abnormalities. Left ventricular diastolic function parameters were normal.  If no changes, I anticipate pt can proceed with surgery as scheduled.   Willeen Cass, FNP-BC [facility]/Anesthesiology Phone: [phone] 06/16/2016 1:30 PM

## 2016-06-17 ENCOUNTER — Ambulatory Visit (HOSPITAL_BASED_OUTPATIENT_CLINIC_OR_DEPARTMENT_OTHER)
Admission: RE | Admit: 2016-06-17 | Discharge: 2016-06-17 | Disposition: A | Discharge status: Home / Self Care | Payer: Federal, State, Local not specified - PPO | Source: Ambulatory Visit | Attending: Family Medicine | Admitting: Family Medicine

## 2016-06-17 DIAGNOSIS — M545 Low back pain: Secondary | ICD-10-CM | POA: Diagnosis not present

## 2016-06-17 DIAGNOSIS — M5440 Lumbago with sciatica, unspecified side: Secondary | ICD-10-CM | POA: Insufficient documentation

## 2016-06-17 DIAGNOSIS — M8938 Hypertrophy of bone, other site: Secondary | ICD-10-CM | POA: Insufficient documentation

## 2016-06-19 ENCOUNTER — Ambulatory Visit: Payer: Self-pay | Admitting: Internal Medicine

## 2016-06-19 MED ORDER — CIPROFLOXACIN IN D5W 400 MG/200ML IV SOLN
400.0000 mg | INTRAVENOUS | Status: AC
  Administered 2016-06-20: 400 mg via INTRAVENOUS
  Filled 2016-06-19: qty 200

## 2016-06-20 ENCOUNTER — Ambulatory Visit (HOSPITAL_COMMUNITY): Payer: Federal, State, Local not specified - PPO | Admitting: Emergency Medicine

## 2016-06-20 ENCOUNTER — Encounter (HOSPITAL_COMMUNITY): Payer: Self-pay | Admitting: Surgery

## 2016-06-20 ENCOUNTER — Ambulatory Visit (HOSPITAL_COMMUNITY): Payer: Federal, State, Local not specified - PPO | Admitting: Certified Registered"

## 2016-06-20 ENCOUNTER — Ambulatory Visit: Payer: Self-pay | Admitting: Family Medicine

## 2016-06-20 ENCOUNTER — Observation Stay (HOSPITAL_COMMUNITY)
Admission: RE | Admit: 2016-06-20 | Discharge: 2016-06-21 | Disposition: A | Discharge status: Home / Self Care | Payer: Federal, State, Local not specified - PPO | Source: Ambulatory Visit | Attending: General Surgery | Admitting: General Surgery

## 2016-06-20 ENCOUNTER — Encounter (HOSPITAL_COMMUNITY)
Admission: RE | Disposition: A | Discharge status: Home / Self Care | Payer: Self-pay | Source: Ambulatory Visit | Attending: General Surgery

## 2016-06-20 DIAGNOSIS — J45909 Unspecified asthma, uncomplicated: Secondary | ICD-10-CM | POA: Diagnosis not present

## 2016-06-20 DIAGNOSIS — K219 Gastro-esophageal reflux disease without esophagitis: Secondary | ICD-10-CM | POA: Diagnosis not present

## 2016-06-20 DIAGNOSIS — Z7951 Long term (current) use of inhaled steroids: Secondary | ICD-10-CM | POA: Diagnosis not present

## 2016-06-20 DIAGNOSIS — Z79899 Other long term (current) drug therapy: Secondary | ICD-10-CM | POA: Diagnosis not present

## 2016-06-20 DIAGNOSIS — Z7984 Long term (current) use of oral hypoglycemic drugs: Secondary | ICD-10-CM | POA: Insufficient documentation

## 2016-06-20 DIAGNOSIS — K8010 Calculus of gallbladder with chronic cholecystitis without obstruction: Principal | ICD-10-CM | POA: Insufficient documentation

## 2016-06-20 DIAGNOSIS — K8080 Other cholelithiasis without obstruction: Secondary | ICD-10-CM | POA: Diagnosis present

## 2016-06-20 DIAGNOSIS — K8020 Calculus of gallbladder without cholecystitis without obstruction: Secondary | ICD-10-CM | POA: Diagnosis not present

## 2016-06-20 DIAGNOSIS — Z9049 Acquired absence of other specified parts of digestive tract: Secondary | ICD-10-CM

## 2016-06-20 DIAGNOSIS — I10 Essential (primary) hypertension: Secondary | ICD-10-CM | POA: Diagnosis not present

## 2016-06-20 DIAGNOSIS — K824 Cholesterolosis of gallbladder: Secondary | ICD-10-CM | POA: Diagnosis not present

## 2016-06-20 DIAGNOSIS — E119 Type 2 diabetes mellitus without complications: Secondary | ICD-10-CM | POA: Insufficient documentation

## 2016-06-20 HISTORY — PX: CHOLECYSTECTOMY: SHX55

## 2016-06-20 LAB — GLUCOSE, CAPILLARY
GLUCOSE-CAPILLARY: 105 mg/dL — AB (ref 65–99)
Glucose-Capillary: 124 mg/dL — ABNORMAL HIGH (ref 65–99)
Glucose-Capillary: 118 mg/dL — ABNORMAL HIGH (ref 65–99)
Glucose-Capillary: 129 mg/dL — ABNORMAL HIGH (ref 65–99)

## 2016-06-20 SURGERY — LAPAROSCOPIC CHOLECYSTECTOMY WITH INTRAOPERATIVE CHOLANGIOGRAM
Anesthesia: General | Site: Abdomen

## 2016-06-20 MED ORDER — ALBUTEROL SULFATE HFA 108 (90 BASE) MCG/ACT IN AERS: 2.0000 | INHALATION_SPRAY | Freq: Four times a day (QID) | RESPIRATORY_TRACT | Status: DC | PRN

## 2016-06-20 MED ORDER — HYDROMORPHONE HCL 1 MG/ML IJ SOLN
INTRAMUSCULAR | Status: AC
  Administered 2016-06-20: 0.5 mg via INTRAVENOUS
  Filled 2016-06-20: qty 1

## 2016-06-20 MED ORDER — BUPIVACAINE-EPINEPHRINE (PF) 0.25% -1:200000 IJ SOLN
INTRAMUSCULAR | Status: AC
  Filled 2016-06-20: qty 30

## 2016-06-20 MED ORDER — PROPOFOL 10 MG/ML IV BOLUS
INTRAVENOUS | Status: DC | PRN
  Administered 2016-06-20: 180 mg via INTRAVENOUS

## 2016-06-20 MED ORDER — KETOROLAC TROMETHAMINE 15 MG/ML IJ SOLN: 15.0000 mg | Freq: Four times a day (QID) | INTRAMUSCULAR | Status: DC | PRN

## 2016-06-20 MED ORDER — GLYCOPYRROLATE 0.2 MG/ML IV SOSY
PREFILLED_SYRINGE | INTRAVENOUS | Status: AC
  Filled 2016-06-20: qty 3

## 2016-06-20 MED ORDER — KETOROLAC TROMETHAMINE 60 MG/2ML IM SOLN
INTRAMUSCULAR | Status: DC | PRN
Start: 2016-06-20 — End: 2016-06-20
  Administered 2016-06-20: 30 mg via INTRAMUSCULAR

## 2016-06-20 MED ORDER — ESMOLOL HCL 100 MG/10ML IV SOLN
INTRAVENOUS | Status: DC | PRN
  Administered 2016-06-20: 20 mg via INTRAVENOUS

## 2016-06-20 MED ORDER — ONDANSETRON HCL 4 MG/2ML IJ SOLN
INTRAMUSCULAR | Status: DC | PRN
  Administered 2016-06-20: 4 mg via INTRAVENOUS

## 2016-06-20 MED ORDER — METOCLOPRAMIDE HCL 5 MG/ML IJ SOLN
INTRAMUSCULAR | Status: DC | PRN
  Administered 2016-06-20: 10 mg via INTRAVENOUS

## 2016-06-20 MED ORDER — MOMETASONE FURO-FORMOTEROL FUM 100-5 MCG/ACT IN AERO
2.0000 | INHALATION_SPRAY | Freq: Two times a day (BID) | RESPIRATORY_TRACT | Status: DC
  Administered 2016-06-20 – 2016-06-21 (×2): 2 via RESPIRATORY_TRACT
  Filled 2016-06-20: qty 8.8

## 2016-06-20 MED ORDER — IPRATROPIUM-ALBUTEROL 0.5-2.5 (3) MG/3ML IN SOLN: 3.0000 mL | Freq: Four times a day (QID) | RESPIRATORY_TRACT | Status: DC | PRN

## 2016-06-20 MED ORDER — SUGAMMADEX SODIUM 500 MG/5ML IV SOLN
INTRAVENOUS | Status: AC
  Filled 2016-06-20: qty 5

## 2016-06-20 MED ORDER — ROCURONIUM 10MG/ML (10ML) SYRINGE FOR MEDFUSION PUMP - OPTIME
INTRAVENOUS | Status: DC | PRN
  Administered 2016-06-20: 50 mg via INTRAVENOUS

## 2016-06-20 MED ORDER — ALBUTEROL SULFATE (2.5 MG/3ML) 0.083% IN NEBU: 2.5000 mg | INHALATION_SOLUTION | Freq: Four times a day (QID) | RESPIRATORY_TRACT | Status: DC | PRN

## 2016-06-20 MED ORDER — LACTATED RINGERS IV SOLN
INTRAVENOUS | Status: DC
  Administered 2016-06-20 (×2): via INTRAVENOUS

## 2016-06-20 MED ORDER — PHENYLEPHRINE HCL 10 MG/ML IJ SOLN
INTRAMUSCULAR | Status: DC | PRN
Start: 2016-06-20 — End: 2016-06-20
  Administered 2016-06-20 (×2): 120 ug via INTRAVENOUS

## 2016-06-20 MED ORDER — SUGAMMADEX SODIUM 500 MG/5ML IV SOLN
INTRAVENOUS | Status: DC | PRN
  Administered 2016-06-20: 400 mg via INTRAVENOUS

## 2016-06-20 MED ORDER — SODIUM CHLORIDE 0.9 % IV SOLN
INTRAVENOUS | Status: DC
  Administered 2016-06-21: 03:00:00 via INTRAVENOUS

## 2016-06-20 MED ORDER — EPHEDRINE SULFATE 50 MG/ML IJ SOLN
INTRAMUSCULAR | Status: AC
  Filled 2016-06-20: qty 1

## 2016-06-20 MED ORDER — BUPIVACAINE-EPINEPHRINE 0.25% -1:200000 IJ SOLN
INTRAMUSCULAR | Status: DC | PRN
  Administered 2016-06-20: 10 mL

## 2016-06-20 MED ORDER — GLYCOPYRROLATE 0.2 MG/ML IJ SOLN
INTRAMUSCULAR | Status: DC | PRN
  Administered 2016-06-20: 0.2 mg via INTRAVENOUS

## 2016-06-20 MED ORDER — HYDROMORPHONE HCL 1 MG/ML IJ SOLN
0.2500 mg | INTRAMUSCULAR | Status: DC | PRN
  Administered 2016-06-20 (×2): 0.5 mg via INTRAVENOUS

## 2016-06-20 MED ORDER — 0.9 % SODIUM CHLORIDE (POUR BTL) OPTIME
TOPICAL | Status: DC | PRN
Start: 2016-06-20 — End: 2016-06-20
  Administered 2016-06-20: 1000 mL

## 2016-06-20 MED ORDER — SODIUM CHLORIDE 0.9 % IR SOLN
Status: DC | PRN
  Administered 2016-06-20: 1000 mL

## 2016-06-20 MED ORDER — POTASSIUM CHLORIDE CRYS ER 20 MEQ PO TBCR
20.0000 meq | EXTENDED_RELEASE_TABLET | Freq: Every day | ORAL | Status: DC
  Administered 2016-06-21: 20 meq via ORAL
  Filled 2016-06-20: qty 1

## 2016-06-20 MED ORDER — FENTANYL CITRATE (PF) 100 MCG/2ML IJ SOLN
INTRAMUSCULAR | Status: DC | PRN
  Administered 2016-06-20: 50 ug via INTRAVENOUS
  Administered 2016-06-20: 100 ug via INTRAVENOUS

## 2016-06-20 MED ORDER — ONDANSETRON HCL 4 MG/2ML IJ SOLN: 4.0000 mg | Freq: Four times a day (QID) | INTRAMUSCULAR | Status: DC | PRN

## 2016-06-20 MED ORDER — PROPOFOL 10 MG/ML IV BOLUS
INTRAVENOUS | Status: AC
  Filled 2016-06-20: qty 20

## 2016-06-20 MED ORDER — ONDANSETRON 4 MG PO TBDP: 4.0000 mg | ORAL_TABLET | Freq: Four times a day (QID) | ORAL | Status: DC | PRN

## 2016-06-20 MED ORDER — MORPHINE SULFATE (PF) 2 MG/ML IV SOLN: 2.0000 mg | INTRAVENOUS | Status: DC | PRN

## 2016-06-20 MED ORDER — HYDROCHLOROTHIAZIDE 25 MG PO TABS
12.5000 mg | ORAL_TABLET | Freq: Every day | ORAL | Status: DC
  Administered 2016-06-21: 12.5 mg via ORAL
  Filled 2016-06-20 (×2): qty 1

## 2016-06-20 MED ORDER — PANTOPRAZOLE SODIUM 40 MG PO TBEC
40.0000 mg | DELAYED_RELEASE_TABLET | Freq: Every day | ORAL | Status: DC
  Administered 2016-06-20 – 2016-06-21 (×2): 40 mg via ORAL
  Filled 2016-06-20 (×2): qty 1

## 2016-06-20 MED ORDER — PHENYLEPHRINE 40 MCG/ML (10ML) SYRINGE FOR IV PUSH (FOR BLOOD PRESSURE SUPPORT)
PREFILLED_SYRINGE | INTRAVENOUS | Status: AC
  Filled 2016-06-20: qty 10

## 2016-06-20 MED ORDER — ESMOLOL HCL 100 MG/10ML IV SOLN
INTRAVENOUS | Status: AC
  Filled 2016-06-20: qty 10

## 2016-06-20 MED ORDER — LIDOCAINE HCL (CARDIAC) 20 MG/ML IV SOLN
INTRAVENOUS | Status: DC | PRN
  Administered 2016-06-20: 60 mg via INTRAVENOUS

## 2016-06-20 MED ORDER — INSULIN ASPART 100 UNIT/ML ~~LOC~~ SOLN
0.0000 [IU] | Freq: Three times a day (TID) | SUBCUTANEOUS | Status: DC
  Administered 2016-06-21: 2 [IU] via SUBCUTANEOUS

## 2016-06-20 MED ORDER — AMLODIPINE BESYLATE 5 MG PO TABS
5.0000 mg | ORAL_TABLET | Freq: Every day | ORAL | Status: DC
  Administered 2016-06-20 – 2016-06-21 (×2): 5 mg via ORAL
  Filled 2016-06-20 (×2): qty 1

## 2016-06-20 MED ORDER — HYDROCODONE-ACETAMINOPHEN 5-325 MG PO TABS
1.0000 | ORAL_TABLET | ORAL | Status: DC | PRN
  Administered 2016-06-20 – 2016-06-21 (×2): 1 via ORAL
  Administered 2016-06-21: 2 via ORAL
  Filled 2016-06-20 (×4): qty 1

## 2016-06-20 MED ORDER — MIDAZOLAM HCL 5 MG/5ML IJ SOLN
INTRAMUSCULAR | Status: DC | PRN
  Administered 2016-06-20: 2 mg via INTRAVENOUS

## 2016-06-20 MED ORDER — IOPAMIDOL (ISOVUE-300) INJECTION 61%
INTRAVENOUS | Status: AC
  Filled 2016-06-20: qty 50

## 2016-06-20 MED ORDER — FENTANYL CITRATE (PF) 250 MCG/5ML IJ SOLN
INTRAMUSCULAR | Status: AC
  Filled 2016-06-20: qty 5

## 2016-06-20 MED ORDER — METOCLOPRAMIDE HCL 5 MG/ML IJ SOLN
INTRAMUSCULAR | Status: AC
  Filled 2016-06-20: qty 2

## 2016-06-20 MED ORDER — ENOXAPARIN SODIUM 40 MG/0.4ML ~~LOC~~ SOLN
40.0000 mg | SUBCUTANEOUS | Status: DC
  Administered 2016-06-21: 40 mg via SUBCUTANEOUS
  Filled 2016-06-20: qty 0.4

## 2016-06-20 MED ORDER — MIDAZOLAM HCL 2 MG/2ML IJ SOLN
INTRAMUSCULAR | Status: AC
  Filled 2016-06-20: qty 2

## 2016-06-20 MED ORDER — METHOCARBAMOL 500 MG PO TABS
500.0000 mg | ORAL_TABLET | Freq: Two times a day (BID) | ORAL | Status: DC
  Administered 2016-06-20 – 2016-06-21 (×3): 500 mg via ORAL
  Filled 2016-06-20 (×3): qty 1

## 2016-06-20 MED ORDER — EPHEDRINE SULFATE 50 MG/ML IJ SOLN
INTRAMUSCULAR | Status: DC | PRN
  Administered 2016-06-20: 15 mg via INTRAVENOUS

## 2016-06-20 MED ORDER — KETOROLAC TROMETHAMINE 30 MG/ML IJ SOLN
INTRAMUSCULAR | Status: AC
  Filled 2016-06-20: qty 1

## 2016-06-20 MED ORDER — SIMETHICONE 80 MG PO CHEW
40.0000 mg | CHEWABLE_TABLET | Freq: Four times a day (QID) | ORAL | Status: DC | PRN
  Administered 2016-06-20: 40 mg via ORAL
  Filled 2016-06-20: qty 1

## 2016-06-20 MED ORDER — FLUTICASONE PROPIONATE 50 MCG/ACT NA SUSP
1.0000 | Freq: Every day | NASAL | Status: DC
  Administered 2016-06-21: 1 via NASAL
  Filled 2016-06-20: qty 16

## 2016-06-20 SURGICAL SUPPLY — 42 items
APPLIER CLIP 5 13 M/L LIGAMAX5 (MISCELLANEOUS) ×2
APR CLP MED LRG 5 ANG JAW (MISCELLANEOUS) ×1
BAG SPEC RTRVL 10 TROC 200 (ENDOMECHANICALS) ×1
BLADE SURG ROTATE 9660 (MISCELLANEOUS) IMPLANT
CANISTER SUCTION 2500CC (MISCELLANEOUS) ×2 IMPLANT
CHLORAPREP W/TINT 26ML (MISCELLANEOUS) ×2 IMPLANT
CLIP APPLIE 5 13 M/L LIGAMAX5 (MISCELLANEOUS) ×1 IMPLANT
COVER MAYO STAND STRL (DRAPES) ×2 IMPLANT
COVER SURGICAL LIGHT HANDLE (MISCELLANEOUS) ×2 IMPLANT
DEVICE TROCAR PUNCTURE CLOSURE (ENDOMECHANICALS) ×2 IMPLANT
DRAPE C-ARM 42X72 X-RAY (DRAPES) ×2 IMPLANT
ELECT REM PT RETURN 9FT ADLT (ELECTROSURGICAL) ×2
ELECTRODE REM PT RTRN 9FT ADLT (ELECTROSURGICAL) ×1 IMPLANT
GLOVE BIO SURGEON STRL SZ7 (GLOVE) ×3 IMPLANT
GLOVE BIOGEL PI IND STRL 7.5 (GLOVE) ×1 IMPLANT
GLOVE BIOGEL PI INDICATOR 7.5 (GLOVE) ×1
GOWN STRL REUS W/ TWL LRG LVL3 (GOWN DISPOSABLE) ×3 IMPLANT
GOWN STRL REUS W/ TWL XL LVL3 (GOWN DISPOSABLE) IMPLANT
GOWN STRL REUS W/TWL LRG LVL3 (GOWN DISPOSABLE) ×4
GOWN STRL REUS W/TWL XL LVL3 (GOWN DISPOSABLE) ×2
KIT BASIN OR (CUSTOM PROCEDURE TRAY) ×2 IMPLANT
KIT ROOM TURNOVER OR (KITS) ×2 IMPLANT
LIQUID BAND (GAUZE/BANDAGES/DRESSINGS) ×2 IMPLANT
NS IRRIG 1000ML POUR BTL (IV SOLUTION) ×2 IMPLANT
PAD ARMBOARD 7.5X6 YLW CONV (MISCELLANEOUS) ×2 IMPLANT
POUCH RETRIEVAL ECOSAC 10 (ENDOMECHANICALS) ×1 IMPLANT
POUCH RETRIEVAL ECOSAC 10MM (ENDOMECHANICALS) ×1
SCISSORS LAP 5X35 DISP (ENDOMECHANICALS) ×2 IMPLANT
SET CHOLANGIOGRAPH 5 50 .035 (SET/KITS/TRAYS/PACK) ×2 IMPLANT
SET IRRIG TUBING LAPAROSCOPIC (IRRIGATION / IRRIGATOR) ×2 IMPLANT
SLEEVE ENDOPATH XCEL 5M (ENDOMECHANICALS) ×4 IMPLANT
SPECIMEN JAR SMALL (MISCELLANEOUS) ×2 IMPLANT
STRIP CLOSURE SKIN 1/2X4 (GAUZE/BANDAGES/DRESSINGS) ×2 IMPLANT
SUT MNCRL AB 4-0 PS2 18 (SUTURE) ×2 IMPLANT
SUT VICRYL 0 UR6 27IN ABS (SUTURE) ×2 IMPLANT
TOWEL OR 17X24 6PK STRL BLUE (TOWEL DISPOSABLE) ×2 IMPLANT
TOWEL OR 17X26 10 PK STRL BLUE (TOWEL DISPOSABLE) ×2 IMPLANT
TRAY LAPAROSCOPIC MC (CUSTOM PROCEDURE TRAY) ×2 IMPLANT
TROCAR XCEL 12X100 BLDLESS (ENDOMECHANICALS) ×1 IMPLANT
TROCAR XCEL BLUNT TIP 100MML (ENDOMECHANICALS) ×2 IMPLANT
TROCAR XCEL NON-BLD 5MMX100MML (ENDOMECHANICALS) ×2 IMPLANT
TUBING INSUFFLATION (TUBING) ×2 IMPLANT

## 2016-06-20 NOTE — Anesthesia Procedure Notes (Signed)
Procedure Name: Intubation Date/Time: 06/20/2016 12:41 PM Performed by: Huey Romans ANN Pre-anesthesia Checklist: Patient identified, Emergency Drugs available, Suction available and Patient being monitored Patient Re-evaluated:Patient Re-evaluated prior to inductionOxygen Delivery Method: Circle System Utilized Preoxygenation: Pre-oxygenation with 100% oxygen Intubation Type: IV induction Ventilation: Mask ventilation without difficulty Laryngoscope Size: Miller and 2 Grade View: Grade I Tube type: Oral Tube size: 7.0 mm Number of attempts: 1 Airway Equipment and Method: Stylet and Oral airway Placement Confirmation: ETT inserted through vocal cords under direct vision,  positive ETCO2 and breath sounds checked- equal and bilateral Secured at: 21 cm Tube secured with: Tape Dental Injury: Teeth and Oropharynx as per pre-operative assessment

## 2016-06-20 NOTE — Discharge Instructions (Signed)
[facility] LAPAROSCOPIC SURGERY: POST OP INSTRUCTIONS  Always review your discharge instruction sheet given to you by the facility where your surgery was performed. IF YOU HAVE DISABILITY OR FAMILY LEAVE FORMS, YOU MUST BRING THEM TO THE OFFICE FOR PROCESSING.   DO NOT GIVE THEM TO YOUR DOCTOR.  1. A prescription for pain medication may be given to you upon discharge.  Take your pain medication as prescribed, if needed.  If narcotic pain medicine is not needed, then you may take acetaminophen (Tylenol), naprosyn (Alleve), or ibuprofen (Advil) as needed. 2. Take your usually prescribed medications unless otherwise directed. 3. If you need a refill on your pain medication, please contact your pharmacy.  They will contact our office to request authorization. Prescriptions will not be filled after 5pm or on week-ends. 4. You should follow a light diet the first few days after arrival home, such as soup and crackers, etc.  Be sure to include lots of fluids daily. 5. Most patients will experience some swelling and bruising in the area of the incisions.  Ice packs will help.  Swelling and bruising can take several days to resolve.  6. It is common to experience some constipation if taking pain medication after surgery.  Increasing fluid intake and taking a stool softener (such as Colace) will usually help or prevent this problem from occurring.  A mild laxative (Milk of Magnesia or Miralax) should be taken according to package instructions if there are no bowel movements after 48 hours. 7. Unless discharge instructions indicate otherwise, you may remove your bandages 48 hours after surgery, and you may shower at that time.  You may have steri-strips (small skin tapes) in place directly over the incision.  These strips should be left on the skin for 7-10 days.  If your surgeon used skin glue on the incision, you may shower in 24 hours.  The glue will flake  off over the next 2-3 weeks.  Any sutures or staples will be removed at the office during your follow-up visit. 8. ACTIVITIES:  You may resume regular (light) daily activities beginning the next day--such as daily self-care, walking, climbing stairs--gradually increasing activities as tolerated.  You may have sexual intercourse when it is comfortable.  Refrain from any heavy lifting or straining until approved by your doctor. a. You may drive when you are no longer taking prescription pain medication, you can comfortably wear a seatbelt, and you can safely maneuver your car and apply brakes. b. RETURN TO WORK:  __________________________________________________________ 9. You should see your doctor in the office for a follow-up appointment approximately 2-3 weeks after your surgery.  Make sure that you call for this appointment within a day or two after you arrive home to insure a convenient appointment time. 10. OTHER INSTRUCTIONS: __________________________________________________________________________________________________________________________ __________________________________________________________________________________________________________________________ WHEN TO CALL YOUR DOCTOR: 1. Fever over 101.0 2. Inability to urinate 3. Continued bleeding from incision. 4. Increased pain, redness, or drainage from the incision. 5. Increasing abdominal pain  The clinic staff is available to answer your questions during regular business hours.  Please don't hesitate to call and ask to speak to one of the nurses for clinical concerns.  If you have a medical emergency, go to the nearest emergency room or call 911.  A surgeon from [facility] is always on call at the hospital. [address], [address], [place], [place]  [address] ? [address] [address], [place], [place]   [address] [phone] ? [phone] ? [phone] Web site: [email or web address]  

## 2016-06-20 NOTE — Anesthesia Preprocedure Evaluation (Signed)
Anesthesia Evaluation  Patient identified by MRN, date of birth, ID band Patient awake    Reviewed: Allergy & Precautions, H&P , NPO status , Patient's Chart, lab work & pertinent test results  Airway Mallampati: II   Neck ROM: full    Dental   Pulmonary asthma ,    breath sounds clear to auscultation       Cardiovascular hypertension,  Rhythm:regular Rate:Normal     Neuro/Psych    GI/Hepatic GERD  ,  Endo/Other  diabetes, Type 2  Renal/GU      Musculoskeletal   Abdominal   Peds  Hematology   Anesthesia Other Findings   Reproductive/Obstetrics                             Anesthesia Physical Anesthesia Plan  ASA: II  Anesthesia Plan: General   Post-op Pain Management:    Induction: Intravenous  Airway Management Planned: Oral ETT  Additional Equipment:   Intra-op Plan:   Post-operative Plan: Extubation in OR  Informed Consent: I have reviewed the patients History and Physical, chart, labs and discussed the procedure including the risks, benefits and alternatives for the proposed anesthesia with the patient or authorized representative who has indicated his/her understanding and acceptance.     Plan Discussed with: CRNA, Anesthesiologist and Surgeon  Anesthesia Plan Comments:         Anesthesia Quick Evaluation

## 2016-06-20 NOTE — H&P (Signed)
Rhonda Morrow is an 55 y.o. female.   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 she has history of asthma. she had some some epigastric pain that is present and is associated with food. she has undergone evaluation with chest ct that is negative and then underwent US of ruq that shows slightly distended gb with several stones with largest measuring 1.7 cm. the cbd is normal. there is no pain over gb.    Past Medical History:  Diagnosis Date  . Asthma   . Complication of anesthesia    one time woke up and was vey scared,17 yrs ago  . Deaf   . Diabetes mellitus without complication ([place])   . GERD (gastroesophageal reflux disease)   . Hypertension   . Left groin pain   . Thyroid disease     Past Surgical History:  Procedure Laterality Date  . CARDIAC CATHETERIZATION    . CESAREAN SECTION    . ESOPHAGEAL MANOMETRY N/A 09/29/2013   Procedure: ESOPHAGEAL MANOMETRY (EM);  Surgeon: Milus Banister, MD;  Location: [facility];  Service: Endoscopy;  Laterality: N/A;    Family History  Problem Relation Age of Onset  . Diabetes Mother   . Heart disease Mother   . Diabetes Father    Social History:  reports that she has never smoked. She has never used smokeless tobacco. She reports that she does not drink alcohol or use drugs.  Allergies:  Allergies  Allergen Reactions  . Losartan Shortness Of Breath  . Oxycodone-Acetaminophen Nausea And Vomiting  . Augmentin [Amoxicillin-Pot Clavulanate] Diarrhea    No prescriptions prior to admission.    No results found for this or any previous visit (from the past 48 hour(s)). No results found.  ROS Negative  Last menstrual period 05/20/2012. Physical Exam   Vitals Weight: 230 lb Height: 62in Body Surface Area: 2.03 m Body Mass Index: 42.07 kg/m  Pulse: 76 (Regular)  BP: 126/80 (Sitting, Left Arm, Standard)  Physical Exam General Mental  Status-Alert. Orientation-Oriented X3. Chest and Lung Exam Chest and lung exam reveals -on auscultation, normal breath sounds, no adventitious sounds and normal vocal resonance. Cardiovascular Cardiovascular examination reveals -normal heart sounds, regular rate and rhythm with no murmurs. Abdomen Note: ruq tender, soft, no murphys sign  Assessment/Plan  Assessment & Plan  GALLSTONES (K80.20) Story: lap chole pending cardiac evaluation I do think she has some symptoms referable to her gallbladder. will get clearance before proceeding with surgery. I discussed the procedure in detail. We discussed the risks and benefits of a laparoscopic cholecystectomy and possible cholangiogram including, but not limited to bleeding, infection, injury to surrounding structures such as the intestine or liver, bile leak, retained gallstones, need to convert to an open procedure, prolonged diarrhea, blood clots such as DVT, common bile duct injury, anesthesia risks, and possible need for additional procedures. The likelihood of improvement in symptoms and return to the patient's normal status is good. We discussed the typical post-operative recovery course  Alonna Bartling, MD 06/20/2016, 9:10 AM

## 2016-06-20 NOTE — Interval H&P Note (Signed)
History and Physical Interval Note:  06/20/2016 11:59 AM  Rhonda Morrow  has presented today for surgery, with the diagnosis of gallstones  The various methods of treatment have been discussed with the patient and family. After consideration of risks, benefits and other options for treatment, the patient has consented to  Procedure(s): LAPAROSCOPIC CHOLECYSTECTOMY WITH INTRAOPERATIVE CHOLANGIOGRAM (N/A) as a surgical intervention .  The patient's history has been reviewed, patient examined, no change in status, stable for surgery.  I have reviewed the patient's chart and labs.  Questions were answered to the patient's satisfaction.     Trinten Boudoin

## 2016-06-20 NOTE — Transfer of Care (Signed)
Immediate Anesthesia Transfer of Care Note  Patient: Rhonda Morrow  Procedure(s) Performed: Procedure(s): LAPAROSCOPIC CHOLECYSTECTOMY (N/A)  Patient Location: PACU  Anesthesia Type:General  Level of Consciousness: awake and alert   Airway & Oxygen Therapy: Patient Spontanous Breathing and Patient connected to face mask oxygen  Post-op Assessment: Report given to RN and Post -op Vital signs reviewed and stable  Post vital signs: Reviewed and stable  Last Vitals:  Vitals:   06/20/16 1054  BP: (!) 162/64  Pulse: 81  Resp: 20  Temp: 36.9 C    Last Pain:  Vitals:   06/20/16 1054  TempSrc: Oral      Patients Stated Pain Goal: 3 ([id number])  Complications: No apparent anesthesia complications

## 2016-06-20 NOTE — Progress Notes (Signed)
Pt admitted from pacu this pm s/p lap chole. Pt is alert, oriented but is deaf. Sign language interpreter utilized at time of admission. Can call [place] on [phone]. Pt voiced 4/10 pain and medicated as ordered. Husband is at bedside and he says he is spending the night on the unit. Husband is deaf too

## 2016-06-20 NOTE — Anesthesia Postprocedure Evaluation (Signed)
Anesthesia Post Note  Patient: Rhonda Morrow  Procedure(s) Performed: Procedure(s) (LRB): LAPAROSCOPIC CHOLECYSTECTOMY (N/A)  Patient location during evaluation: PACU Anesthesia Type: General Level of consciousness: awake and alert Pain management: pain level controlled Vital Signs Assessment: post-procedure vital signs reviewed and stable Respiratory status: spontaneous breathing, nonlabored ventilation, respiratory function stable and patient connected to nasal cannula oxygen Cardiovascular status: blood pressure returned to baseline and stable Postop Assessment: no signs of nausea or vomiting Anesthetic complications: no    Last Vitals:  Vitals:   06/20/16 1440 06/20/16 1508  BP: 122/68 (!) 116/54  Pulse: 94 93  Resp: 16 16  Temp:  36.6 C    Last Pain:  Vitals:   06/20/16 1508  TempSrc: Oral  PainSc:                  Aubreana Cornacchia,JAMES TERRILL

## 2016-06-20 NOTE — Op Note (Signed)
Preoperative diagnosis: biliary colic Postoperative diagnosis: same as above, chronic cholecystitis Procedure: laparoscopic cholecystectomy Surgeon: Dr Serita Grammes Anesthesia: general EBL: minimal Drains none Specimen gb and contents to pathology Complications: none Sponge count correct at completion Disposition to recovery stable   Indications: This is a 48 yof with biliary colic and gallstones. We discussed laparoscopic cholecystectomy.   Procedure: After informed consent was obtained the patient was taken to the operating room. She was given antibiotics. Sequential compression devices were on her legs. She was placed under general anesthesia without complication. Her abdomen was prepped and draped in the standard sterile surgical fashion. A surgical timeout was then performed.  I made a luq incision after infiltration with marcaine.  I then inserted a 5 mm optiview trocar.  There was no entry injury.   I then insufflated the abdomen to 15 mm Hg pressure.I then placed 2 additional 5 mm trocars in the epigastrium and the right upper quadrant.I placed an additional 12 mm trocar above the umbilicus. There was small umbliical hernia present.    I was able to grasp the gallbladder and retract it cephalad and lateral.Once I did this the gallbladder which was distended ruptured. She had what appeared to be chronic cholecystitis.  I then obtained the critical view of safety. I clipped and divided the cystic artery but did so with two sets of clips treating the anterior and posterior branches of the artery.I treated the cystic duct in a similar fashion. The duct was viable.  I then removed the gallbladder from the liver bed and placed it in a bag. I then obtained hemostasis and irrigated. I then removed the 12 mm trocar and closed this with a 2-0 vicryl suture using the endoclose device.  I then desufflated the abdomen and removed all my remaining trocars. I then closed these with 4-0 Monocryl  and Dermabond. She tolerated this well was extubated and transferred to the recovery room in stable condition

## 2016-06-21 ENCOUNTER — Encounter (HOSPITAL_COMMUNITY): Payer: Self-pay | Admitting: General Surgery

## 2016-06-21 DIAGNOSIS — E119 Type 2 diabetes mellitus without complications: Secondary | ICD-10-CM | POA: Diagnosis not present

## 2016-06-21 DIAGNOSIS — J45909 Unspecified asthma, uncomplicated: Secondary | ICD-10-CM | POA: Diagnosis not present

## 2016-06-21 DIAGNOSIS — K8010 Calculus of gallbladder with chronic cholecystitis without obstruction: Secondary | ICD-10-CM | POA: Diagnosis not present

## 2016-06-21 DIAGNOSIS — K219 Gastro-esophageal reflux disease without esophagitis: Secondary | ICD-10-CM | POA: Diagnosis not present

## 2016-06-21 DIAGNOSIS — Z79899 Other long term (current) drug therapy: Secondary | ICD-10-CM | POA: Diagnosis not present

## 2016-06-21 DIAGNOSIS — I10 Essential (primary) hypertension: Secondary | ICD-10-CM | POA: Diagnosis not present

## 2016-06-21 DIAGNOSIS — Z7951 Long term (current) use of inhaled steroids: Secondary | ICD-10-CM | POA: Diagnosis not present

## 2016-06-21 DIAGNOSIS — Z7984 Long term (current) use of oral hypoglycemic drugs: Secondary | ICD-10-CM | POA: Diagnosis not present

## 2016-06-21 LAB — GLUCOSE, CAPILLARY
GLUCOSE-CAPILLARY: 137 mg/dL — AB (ref 65–99)
Glucose-Capillary: 125 mg/dL — ABNORMAL HIGH (ref 65–99)

## 2016-06-21 MED ORDER — CYCLOBENZAPRINE HCL 5 MG PO TABS: 5.0000 mg | ORAL_TABLET | Freq: Every day | ORAL | 0 refills | Status: DC

## 2016-06-21 MED ORDER — TRAMADOL HCL 50 MG PO TABS
50.0000 mg | ORAL_TABLET | Freq: Four times a day (QID) | ORAL | 1 refills | Status: DC | PRN
Start: 2016-06-21 — End: 2016-08-01

## 2016-06-21 MED ORDER — METFORMIN HCL ER 500 MG PO TB24
500.0000 mg | ORAL_TABLET | Freq: Every day | ORAL | Status: DC
  Administered 2016-06-21: 500 mg via ORAL
  Filled 2016-06-21: qty 1

## 2016-06-21 NOTE — Progress Notes (Signed)
Pt discharge. Discharge paper reviewed with pt and husband in presence of interpreter. Pt understands. Exit unit via wheelchair.

## 2016-06-22 NOTE — Discharge Summary (Signed)
Physician Discharge Summary  Patient ID: Rhonda Morrow MRN: [id number] DOB/AGE: 1961-01-26 55 y.o.  Admit date: 06/20/2016 Discharge date: 06/22/2016  Admission Diagnoses: Hearing impaired Gallstones  Discharge Diagnoses:  Active Problems:   History of laparoscopic cholecystectomy chronic cholecystitis  Discharged Condition: good  Hospital Course: 60 yof who has multiple issues and has undergone negative cardiac workup. I think some of pain was related to gallbladder and gallstones. She underwent laparoscopic cholecystectomy where her gallbladder was noted to have chronic cholecystitis.  She tolerated this well. She remained overnight and the following day was doing well. She had good pain control, was voiding, tolerating diet.   Consults: none  Significant Diagnostic Studies:none  Treatments: lap chole  Discharge Exam: Blood pressure (!) 104/44, pulse 78, temperature 98.5 F (36.9 C), temperature source Oral, resp. rate 17, height 5\' 4"  (1.626 m), weight 104.8 kg (231 lb), last menstrual period 05/20/2012, SpO2 96 %. abd soft approp tender incisions clean  Disposition: 01-Home or Self Care     Medication List    TAKE these medications   AEROCHAMBER MV inhaler Use as instructed   albuterol 108 (90 Base) MCG/ACT inhaler Commonly known as:  PROVENTIL HFA;VENTOLIN HFA Inhale 2 puffs into the lungs every 6 (six) hours as needed for wheezing.   amLODipine 5 MG tablet Commonly known as:  NORVASC TAKE 1 TABLET (5 MG TOTAL) BY MOUTH DAILY.   cetirizine 10 MG tablet Commonly known as:  ZYRTEC Take 1 tablet (10 mg total) by mouth daily.   cyclobenzaprine 5 MG tablet Commonly known as:  FLEXERIL Take 1 tablet (5 mg total) by mouth at bedtime.   diclofenac 75 MG EC tablet Commonly known as:  VOLTAREN Take 1 tablet (75 mg total) by mouth 2 (two) times daily.   Fiber Chew Chew 1 tablet by mouth daily.   fluticasone 50 MCG/ACT nasal spray Commonly known as:   FLONASE PLACE 2 SPRAYS INTO BOTH NOSTRILS DAILY.   glucose blood test strip Use as instructed--- one touch verio test strips   hydrochlorothiazide 12.5 MG tablet Commonly known as:  HYDRODIURIL Take 1 tablet (12.5 mg total) by mouth daily.   ipratropium-albuterol 0.5-2.5 (3) MG/3ML Soln Commonly known as:  DUONEB Take 3 mLs by nebulization every 6 (six) hours as needed.   meclizine 25 MG tablet Commonly known as:  ANTIVERT Take 1 tablet (25 mg total) by mouth 3 (three) times daily as needed.   metFORMIN 500 MG 24 hr tablet Commonly known as:  GLUCOPHAGE-XR TAKE 1 TABLET (500 MG TOTAL) BY MOUTH DAILY WITH BREAKFAST.   methocarbamol 500 MG tablet Commonly known as:  ROBAXIN Take 1 tablet (500 mg total) by mouth 2 (two) times daily.   mometasone-formoterol 100-5 MCG/ACT Aero Commonly known as:  DULERA Inhale 2 puffs into the lungs 2 (two) times daily.   multivitamin tablet Take 1 tablet by mouth daily.   NONFORMULARY OR COMPOUNDED ITEM Nebulizer   DX ASTHMA   omeprazole 20 MG capsule Commonly known as:  PRILOSEC Take 1 capsule (20 mg total) by mouth daily.   potassium chloride SA 20 MEQ tablet Commonly known as:  K-DUR,KLOR-CON Take 1 tablet (20 mEq total) by mouth daily.   traMADol 50 MG tablet Commonly known as:  ULTRAM Take 1 tablet (50 mg total) by mouth every 8 (eight) hours as needed. What changed:  Another medication with the same name was added. Make sure you understand how and when to take each.   traMADol 50 MG tablet Commonly known  as:  ULTRAM Take 1-2 tablets (50-100 mg total) by mouth every 6 (six) hours as needed. What changed:  You were already taking a medication with the same name, and this prescription was added. Make sure you understand how and when to take each.      Follow-up Information    Neeva Trew, MD Follow up in 3 week(s).   Specialty:  General Surgery Contact information: [place]  [id number] [phone]           Signed: Rolm Bookbinder 06/22/2016, 8:01 AM

## 2016-06-30 ENCOUNTER — Encounter: Payer: Self-pay | Admitting: Family Medicine

## 2016-06-30 ENCOUNTER — Telehealth: Payer: Self-pay | Admitting: Family Medicine

## 2016-06-30 NOTE — Telephone Encounter (Signed)
Mailed letter to patient and sent message via My Chart (Active) informing patient that her appointment on August 15, 2016 has been cancelled and needs to be rescheduled.

## 2016-07-11 ENCOUNTER — Ambulatory Visit (INDEPENDENT_AMBULATORY_CARE_PROVIDER_SITE_OTHER): Payer: Federal, State, Local not specified - PPO | Admitting: Family Medicine

## 2016-07-11 ENCOUNTER — Encounter: Payer: Self-pay | Admitting: Family Medicine

## 2016-07-11 DIAGNOSIS — M67912 Unspecified disorder of synovium and tendon, left shoulder: Secondary | ICD-10-CM | POA: Diagnosis not present

## 2016-07-13 ENCOUNTER — Other Ambulatory Visit: Payer: Self-pay | Admitting: Family Medicine

## 2016-07-13 ENCOUNTER — Ambulatory Visit: Payer: Self-pay | Admitting: Pulmonary Disease

## 2016-07-13 DIAGNOSIS — K219 Gastro-esophageal reflux disease without esophagitis: Secondary | ICD-10-CM

## 2016-07-13 NOTE — Assessment & Plan Note (Signed)
2/2 rotator cuff strain.  Much improved with home exercises.  At this point will release from care.  Continue full duty - no impairment or restrictions.

## 2016-07-13 NOTE — Progress Notes (Signed)
PCP: Ann Held, DO Referred by Dr. Larose Kells  Subjective:   HPI: Patient is a 55 y.o. female here for left shoulder injury.  8/18: Interpreter used throughout visit. Patient reports injury occurred on 7/29 at work. She drives a forklift at work. Felt a pop in left shoulder while doing so - mentioned some of equipment was broken and she did not realize this. Seemed to be mild but later that day when doing sign language she felt a lot of pain in the left shoulder. Pain currently 7/10. Tried aleve, advil, icing. Has not had radiographs. + night pain. Is right handed. Remotely  Had problems with this joint - had an injection and completely improved.  9/22: Interpreter present for visit. Patient reports pain still 6/10 at work, 5/10 at rest. Has not heard yet about physical therapy. No other changes.  11/7: Interpreter present. Unfortunately patient still has not heard about physical therapy. Pain level 5/10. No skin changes, fever, other complaints. Using pain pills, occasional ice. Doing home exercises.  12/19: Patient reports only slight improvement from last visit - 4/10 level pain, dull lateral left shoulder. Is taking voltaren but needs a refill - this helps. Avoiding overhead activities when possible because they bother her. Doing home exercises. Has not heard from workers comp regarding physical therapy. No skin changes, other complaints.  12/06/15: Patient feels mildly improved though still has not had physical therapy approved nor an MRI. Pain level 2/10 at rest, dull lateral left shoulder. Worse with overhead motions. No skin changes, other complaints. Doing home exercises.  3/30: Patient reports she has mainly been resting her shoulder now. No pain currently - gets some soreness with full motions. Pain level 0/10. No skin changes, fever, other complaints. No radiation.  5/15: Patient reports she feels much better. Still notices pain especially  with weather change, a lot of movement. Pain currently 0/10. Taking diclofenac as needed. No skin changes, fever, other complaints.  9/5: Patient reports she's doing well. No pain currently in left shoulder. Not doing home exercises because she's resting from recent gall bladder surgery. No skin changes, numbness.  Past Medical History:  Diagnosis Date  . Asthma   . Complication of anesthesia    one time woke up and was vey scared,17 yrs ago  . Deaf   . Diabetes mellitus without complication ([place])   . GERD (gastroesophageal reflux disease)   . Hypertension   . Left groin pain   . Thyroid disease     Current Outpatient Prescriptions on File Prior to Visit  Medication Sig Dispense Refill  . albuterol (PROVENTIL HFA;VENTOLIN HFA) 108 (90 Base) MCG/ACT inhaler Inhale 2 puffs into the lungs every 6 (six) hours as needed for wheezing. 1 Inhaler 3  . amLODipine (NORVASC) 5 MG tablet TAKE 1 TABLET (5 MG TOTAL) BY MOUTH DAILY. 30 tablet 6  . cetirizine (ZYRTEC) 10 MG tablet Take 1 tablet (10 mg total) by mouth daily. 30 tablet 11  . cyclobenzaprine (FLEXERIL) 5 MG tablet Take 1 tablet (5 mg total) by mouth at bedtime. 10 tablet 0  . diclofenac (VOLTAREN) 75 MG EC tablet Take 1 tablet (75 mg total) by mouth 2 (two) times daily. 60 tablet 1  . Fiber CHEW Chew 1 tablet by mouth daily.    . fluticasone (FLONASE) 50 MCG/ACT nasal spray PLACE 2 SPRAYS INTO BOTH NOSTRILS DAILY. 16 g 4  . glucose blood test strip Use as instructed--- one touch verio test strips 100 each 12  .  hydrochlorothiazide (HYDRODIURIL) 12.5 MG tablet Take 1 tablet (12.5 mg total) by mouth daily. 90 tablet 1  . ipratropium-albuterol (DUONEB) 0.5-2.5 (3) MG/3ML SOLN Take 3 mLs by nebulization every 6 (six) hours as needed. 360 mL 3  . meclizine (ANTIVERT) 25 MG tablet Take 1 tablet (25 mg total) by mouth 3 (three) times daily as needed. 45 tablet 3  . metFORMIN (GLUCOPHAGE-XR) 500 MG 24 hr tablet TAKE 1 TABLET (500 MG TOTAL)  BY MOUTH DAILY WITH BREAKFAST. 90 tablet 1  . methocarbamol (ROBAXIN) 500 MG tablet Take 1 tablet (500 mg total) by mouth 2 (two) times daily. 20 tablet 0  . mometasone-formoterol (DULERA) 100-5 MCG/ACT AERO Inhale 2 puffs into the lungs 2 (two) times daily. 8.8 g 3  . Multiple Vitamin (MULTIVITAMIN) tablet Take 1 tablet by mouth daily.    . NONFORMULARY OR COMPOUNDED ITEM Nebulizer   DX ASTHMA 1 each 0  . potassium chloride SA (K-DUR,KLOR-CON) 20 MEQ tablet Take 1 tablet (20 mEq total) by mouth daily. 90 tablet 1  . Spacer/Aero-Holding Chambers (AEROCHAMBER MV) inhaler Use as instructed 1 each 0  . traMADol (ULTRAM) 50 MG tablet Take 1 tablet (50 mg total) by mouth every 8 (eight) hours as needed. 30 tablet 1  . traMADol (ULTRAM) 50 MG tablet Take 1-2 tablets (50-100 mg total) by mouth every 6 (six) hours as needed. 30 tablet 1   No current facility-administered medications on file prior to visit.     Past Surgical History:  Procedure Laterality Date  . CARDIAC CATHETERIZATION    . CESAREAN SECTION    . CHOLECYSTECTOMY N/A 06/20/2016   Procedure: LAPAROSCOPIC CHOLECYSTECTOMY;  Surgeon: Rolm Bookbinder, MD;  Location: [facility];  Service: General;  Laterality: N/A;  . ESOPHAGEAL MANOMETRY N/A 09/29/2013   Procedure: ESOPHAGEAL MANOMETRY (EM);  Surgeon: Milus Banister, MD;  Location: [facility];  Service: Endoscopy;  Laterality: N/A;    Allergies  Allergen Reactions  . Losartan Shortness Of Breath  . Oxycodone-Acetaminophen Nausea And Vomiting  . Augmentin [Amoxicillin-Pot Clavulanate] Diarrhea    Social History   Social History  . Marital status: Married    Spouse name: N/A  . Number of children: N/A  . Years of education: N/A   Occupational History  . diabled    Social History Main Topics  . Smoking status: Never Smoker  . Smokeless tobacco: Never Used  . Alcohol use No  . Drug use: No  . Sexual activity: No   Other Topics Concern  . Not on file   Social History  Narrative  . No narrative on file    Family History  Problem Relation Age of Onset  . Diabetes Mother   . Heart disease Mother   . Diabetes Father     BP 107/71   Pulse 87   Ht 5\' 4"  (1.626 m)   Wt 222 lb (100.7 kg)   LMP 05/20/2012   BMI 38.11 kg/m   Review of Systems: See HPI above.    Objective:  Physical Exam:  Gen: NAD  Left shoulder: No swelling, ecchymoses.  No gross deformity. No TTP. FROM without painful arc. Negative Hawkins, negative Neers. Negative Speeds, Yergasons. Strength 5/5 with empty can and 5/5 resisted internal/external rotation. Negative apprehension. NV intact distally.  Right shoulder: FROM without pain.    Assessment & Plan:  1. Left shoulder injury - 2/2 rotator cuff strain.  Much improved with home exercises.  At this point will release from care.  Continue full  duty - no impairment or restrictions.

## 2016-07-27 ENCOUNTER — Telehealth: Payer: Self-pay | Admitting: Pulmonary Disease

## 2016-07-27 ENCOUNTER — Ambulatory Visit: Payer: Self-pay | Admitting: Pulmonary Disease

## 2016-07-27 NOTE — Telephone Encounter (Signed)
LMTCB via relay service

## 2016-07-28 NOTE — Telephone Encounter (Signed)
LMTCB  Pt can reschedule with any NP or AD.

## 2016-08-01 ENCOUNTER — Ambulatory Visit (INDEPENDENT_AMBULATORY_CARE_PROVIDER_SITE_OTHER): Payer: Federal, State, Local not specified - PPO | Admitting: Adult Health

## 2016-08-01 ENCOUNTER — Encounter: Payer: Self-pay | Admitting: Adult Health

## 2016-08-01 DIAGNOSIS — J4530 Mild persistent asthma, uncomplicated: Secondary | ICD-10-CM

## 2016-08-01 DIAGNOSIS — Z23 Encounter for immunization: Secondary | ICD-10-CM | POA: Diagnosis not present

## 2016-08-01 MED ORDER — MOMETASONE FURO-FORMOTEROL FUM 100-5 MCG/ACT IN AERO: 2.0000 | INHALATION_SPRAY | Freq: Two times a day (BID) | RESPIRATORY_TRACT | 5 refills | Status: DC

## 2016-08-01 NOTE — Patient Instructions (Signed)
Glad you are doing so well.  Continue on Dulera 2 puffs Twice daily  , rinse after use.  Flu shot today .  Follow up Dr. Chase Caller in 6 months and As needed

## 2016-08-01 NOTE — Progress Notes (Signed)
Subjective:    Patient ID: Rhonda Morrow, female    DOB: 09-29-1961, 55 y.o.   MRN: [id number]  HPI 55 year old female never smoker with known asthma Patient is deaf and mute  Test PFt date 01/24/12 shows mixed obstruction - restriction but greater restriction with low dlco  - fev1 1/48L/58%, Ratio 82, TLC 56, DLCO 14/51%, No BD response  CT chest 02/09/2012 showed no evidence of scarring   08/01/2016 Follow-up asthma Interpreter present for visit today. She returns for follow up . Says she is doing very . Best in long time  Is very appreciative today, very thankful to feel better.  Remains on Dulera Twice daily  .  No flare of cough or wheezing .  She denies chest pain, orthopnea, PND, or increased leg swelling.   Had cholecystectomy last month, did well post op.     Past Medical History:  Diagnosis Date  . Asthma   . Complication of anesthesia    one time woke up and was vey scared,17 yrs ago  . Deaf   . Diabetes mellitus without complication ([place])   . GERD (gastroesophageal reflux disease)   . Hypertension   . Left groin pain   . Thyroid disease    Current Outpatient Prescriptions on File Prior to Visit  Medication Sig Dispense Refill  . albuterol (PROVENTIL HFA;VENTOLIN HFA) 108 (90 Base) MCG/ACT inhaler Inhale 2 puffs into the lungs every 6 (six) hours as needed for wheezing. 1 Inhaler 3  . amLODipine (NORVASC) 5 MG tablet TAKE 1 TABLET (5 MG TOTAL) BY MOUTH DAILY. 30 tablet 6  . cetirizine (ZYRTEC) 10 MG tablet Take 1 tablet (10 mg total) by mouth daily. 30 tablet 11  . cyclobenzaprine (FLEXERIL) 5 MG tablet Take 1 tablet (5 mg total) by mouth at bedtime. 10 tablet 0  . diclofenac (VOLTAREN) 75 MG EC tablet Take 1 tablet (75 mg total) by mouth 2 (two) times daily. 60 tablet 1  . fluticasone (FLONASE) 50 MCG/ACT nasal spray PLACE 2 SPRAYS INTO BOTH NOSTRILS DAILY. 16 g 4  . glucose blood test strip Use as instructed--- one touch verio test strips 100 each 12  .  ipratropium-albuterol (DUONEB) 0.5-2.5 (3) MG/3ML SOLN Take 3 mLs by nebulization every 6 (six) hours as needed. 360 mL 3  . meclizine (ANTIVERT) 25 MG tablet Take 1 tablet (25 mg total) by mouth 3 (three) times daily as needed. 45 tablet 3  . metFORMIN (GLUCOPHAGE-XR) 500 MG 24 hr tablet TAKE 1 TABLET (500 MG TOTAL) BY MOUTH DAILY WITH BREAKFAST. 90 tablet 1  . NONFORMULARY OR COMPOUNDED ITEM Nebulizer   DX ASTHMA 1 each 0  . omeprazole (PRILOSEC) 20 MG capsule TAKE 1 CAPSULE (20 MG TOTAL) BY MOUTH DAILY. 30 capsule 2  . potassium chloride SA (K-DUR,KLOR-CON) 20 MEQ tablet Take 1 tablet (20 mEq total) by mouth daily. 90 tablet 1  . Spacer/Aero-Holding Chambers (AEROCHAMBER MV) inhaler Use as instructed 1 each 0  . traMADol (ULTRAM) 50 MG tablet Take 1 tablet (50 mg total) by mouth every 8 (eight) hours as needed. 30 tablet 1  . Fiber CHEW Chew 1 tablet by mouth daily.    . hydrochlorothiazide (HYDRODIURIL) 12.5 MG tablet Take 1 tablet (12.5 mg total) by mouth daily. (Patient not taking: Reported on 08/01/2016) 90 tablet 1  . methocarbamol (ROBAXIN) 500 MG tablet Take 1 tablet (500 mg total) by mouth 2 (two) times daily. (Patient not taking: Reported on 08/01/2016) 20 tablet 0  . Multiple  Vitamin (MULTIVITAMIN) tablet Take 1 tablet by mouth daily.     No current facility-administered medications on file prior to visit.      Review of Systems Constitutional:   No  weight loss, night sweats,  Fevers, chills,  + fatigue, or  lassitude.  HEENT:   No headaches,  Difficulty swallowing,  Tooth/dental problems, or  Sore throat,                No sneezing, itching, ear ache,  +nasal congestion, post nasal drip,   CV:  No chest pain,  Orthopnea, PND, swelling in lower extremities, anasarca, dizziness, palpitations, syncope.   GI  No heartburn, indigestion, abdominal pain, nausea, vomiting, diarrhea, change in bowel habits, loss of appetite, bloody stools.   Resp: .  Marland Kitchen  No chest wall  deformity  Skin: no rash or lesions.  GU: no dysuria, change in color of urine, no urgency or frequency.  No flank pain, no hematuria   MS:  No joint pain or swelling.  No decreased range of motion.  No back pain.  Psych:  No change in mood or affect. No depression or anxiety.  No memory loss.         Objective:   Physical Exam  Vitals:   08/01/16 1623  BP: 112/72  Pulse: 72  Temp: 97.8 F (36.6 C)  TempSrc: Oral  SpO2: 99%  Weight: 225 lb (102.1 kg)  Height: 5\' 4"  (1.626 m)    GEN: A/Ox3; pleasant , NAD, obese  Deaf/mute with interpretor    HEENT:  [place]/AT,  EACs-clear, TMs-wnl, NOSE-clear, THROAT-clear, no lesions, no postnasal drip or exudate noted.   NECK:  Supple w/ fair ROM; no JVD; normal carotid impulses w/o bruits; no thyromegaly or nodules palpated; no lymphadenopathy.    RESP  Clear  P & A; w/o, wheezes/ rales/ or rhonchi. no accessory muscle use, no dullness to percussion  CARD:  RRR, no m/r/g  , no peripheral edema, pulses intact, no cyanosis or clubbing.  GI:   Soft & nt; nml bowel sounds; no organomegaly or masses detected.   Musco: Warm bil, no deformities or joint swelling noted.   Neuro: alert, no focal deficits noted.    Skin: Warm, no lesions or rashes  Ryleeann Urquiza NP-C  [facility]  08/01/2016        Assessment & Plan:

## 2016-08-01 NOTE — Telephone Encounter (Signed)
LMTCB via relay service

## 2016-08-03 NOTE — Assessment & Plan Note (Signed)
Controlled on present regimen   Plan  Patient Instructions  Glad you are doing so well.  Continue on Dulera 2 puffs Twice daily  , rinse after use.  Flu shot today .  Follow up Dr. Chase Caller in 6 months and As needed

## 2016-08-03 NOTE — Telephone Encounter (Signed)
LMTCB

## 2016-08-07 ENCOUNTER — Telehealth: Payer: Self-pay | Admitting: Pulmonary Disease

## 2016-08-07 NOTE — Telephone Encounter (Signed)
I do not see where we called the pt. I also looked in the pt's husband's chart and there is no documentation that we called him either. A message has been left for them to return our call.

## 2016-08-09 NOTE — Telephone Encounter (Signed)
Called and lmom via interpreter to have the pt or her husband return our call.

## 2016-08-10 NOTE — Telephone Encounter (Signed)
lmtcb x3 for pt via interpreter service.

## 2016-08-10 NOTE — Telephone Encounter (Signed)
lmtcb via interpreter service.

## 2016-08-10 NOTE — Telephone Encounter (Signed)
Patient returning call -pr °

## 2016-08-11 NOTE — Telephone Encounter (Signed)
Message was on the pts husband. Nothing further is needed.

## 2016-08-11 NOTE — Telephone Encounter (Signed)
LMOM TCB x2

## 2016-08-15 ENCOUNTER — Ambulatory Visit: Payer: Self-pay | Admitting: Family Medicine

## 2016-08-19 ENCOUNTER — Other Ambulatory Visit: Payer: Self-pay | Admitting: Family Medicine

## 2016-08-19 DIAGNOSIS — M5440 Lumbago with sciatica, unspecified side: Secondary | ICD-10-CM

## 2016-08-21 ENCOUNTER — Other Ambulatory Visit: Payer: Self-pay

## 2016-08-21 ENCOUNTER — Encounter: Payer: Self-pay | Admitting: Medical

## 2016-08-21 ENCOUNTER — Ambulatory Visit: Payer: Self-pay | Admitting: Family Medicine

## 2016-08-21 ENCOUNTER — Ambulatory Visit (INDEPENDENT_AMBULATORY_CARE_PROVIDER_SITE_OTHER): Payer: Federal, State, Local not specified - PPO | Admitting: Medical

## 2016-08-21 ENCOUNTER — Ambulatory Visit (HOSPITAL_BASED_OUTPATIENT_CLINIC_OR_DEPARTMENT_OTHER)
Admission: RE | Admit: 2016-08-21 | Discharge: 2016-08-21 | Disposition: A | Discharge status: Home / Self Care | Payer: Federal, State, Local not specified - PPO | Source: Ambulatory Visit | Attending: Medical | Admitting: Medical

## 2016-08-21 VITALS — BP 117/59 | HR 75 | Temp 97.8°F | Ht 64.0 in | Wt 224.0 lb

## 2016-08-21 DIAGNOSIS — J309 Allergic rhinitis, unspecified: Secondary | ICD-10-CM | POA: Diagnosis not present

## 2016-08-21 DIAGNOSIS — R5383 Other fatigue: Secondary | ICD-10-CM

## 2016-08-21 DIAGNOSIS — M25512 Pain in left shoulder: Secondary | ICD-10-CM | POA: Diagnosis not present

## 2016-08-21 DIAGNOSIS — K429 Umbilical hernia without obstruction or gangrene: Secondary | ICD-10-CM | POA: Diagnosis not present

## 2016-08-21 DIAGNOSIS — G4700 Insomnia, unspecified: Secondary | ICD-10-CM | POA: Diagnosis not present

## 2016-08-21 DIAGNOSIS — K219 Gastro-esophageal reflux disease without esophagitis: Secondary | ICD-10-CM

## 2016-08-21 DIAGNOSIS — M25511 Pain in right shoulder: Secondary | ICD-10-CM

## 2016-08-21 MED ORDER — ZOLPIDEM TARTRATE 10 MG PO TABS: 10.0000 mg | ORAL_TABLET | Freq: Every evening | ORAL | 0 refills | Status: DC | PRN

## 2016-08-21 MED ORDER — ZOLPIDEM TARTRATE 10 MG PO TABS: 10.0000 mg | ORAL_TABLET | Freq: Every evening | ORAL | 1 refills | Status: DC | PRN

## 2016-08-21 MED ORDER — DICLOFENAC SODIUM 75 MG PO TBEC: 75.0000 mg | DELAYED_RELEASE_TABLET | Freq: Two times a day (BID) | ORAL | 1 refills | Status: DC

## 2016-08-21 MED ORDER — AZELASTINE HCL 0.1 % NA SOLN: 2.0000 | Freq: Two times a day (BID) | NASAL | 3 refills | Status: DC

## 2016-08-21 MED ORDER — OMEPRAZOLE 20 MG PO CPDR: 20.0000 mg | DELAYED_RELEASE_CAPSULE | Freq: Every day | ORAL | 2 refills | Status: DC

## 2016-08-21 MED ORDER — FLUTICASONE PROPIONATE 50 MCG/ACT NA SUSP: NASAL | 4 refills | Status: DC

## 2016-08-21 NOTE — Progress Notes (Signed)
Pre visit review using our clinic tool,if applicable. No additional management support is needed unless otherwise documented below in the visit note.  

## 2016-08-21 NOTE — Progress Notes (Addendum)
Subjective:    Patient ID: Rhonda Morrow, female    DOB: 29-Jan-1961, 55 y.o.   MRN: [id number]  HPI  Pt in for follow up.   Pt states around noon at work she has felt mild emotional and frustrated on saturday. This has been going on from Saturday until now. She has had insomnia for 2 days. She is not identifying any severe stress other than regular stress. Not reporting depression or anxiety.  Pt last menstrual cycle was at 55 yo(menses just stopped then). No hot flashes but states at moms house she does not run [facility] and she get hot easily.  Pt has been sneezing a lot at times since the weekend. No pnd. Her eyes are not itching. Pt feels some congested nasal. But no sinus pressure.   Pt states some lower abdomen pain that is transient last for one second whe she sneezes and then goes away. On review pt is not coughing though this was listed by MA or front staff.?  Pt has some pain in her left shoulder. She hurt her left upper arm at work. She did have it evaluated. Pain onset on this Friday. 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 Pt pain has been gradually decreasing.(pt was advised to mention this to her work)   Review of Systems  Constitutional: Positive for fatigue. Negative for chills and fever.  HENT: Positive for sneezing. Negative for dental problem, ear pain and sinus pressure.   Respiratory: Negative for cough, chest tightness, shortness of breath and wheezing.   Cardiovascular: Negative for chest pain and palpitations.  Gastrointestinal: Negative for abdominal distention, constipation, diarrhea, rectal pain and vomiting.       Pain when sneezing.  Musculoskeletal: Negative for back pain and myalgias.       Shoulder pain.   Skin: Negative for rash.  Neurological: Negative for dizziness, speech difficulty, weakness and light-headedness.  Hematological: Negative for adenopathy. Does not bruise/bleed easily.  Psychiatric/Behavioral: Negative for  agitation, behavioral problems, dysphoric mood, sleep disturbance and suicidal ideas. The patient is not nervous/anxious.        Some emotional and frustrated.    Past Medical History:  Diagnosis Date  . Asthma   . Complication of anesthesia    one time woke up and was vey scared,17 yrs ago  . Deaf   . Diabetes mellitus without complication ([place])   . GERD (gastroesophageal reflux disease)   . Hypertension   . Left groin pain   . Thyroid disease      Social History   Social History  . Marital status: Married    Spouse name: N/A  . Number of children: N/A  . Years of education: N/A   Occupational History  . diabled    Social History Main Topics  . Smoking status: Never Smoker  . Smokeless tobacco: Never Used  . Alcohol use No  . Drug use: No  . Sexual activity: No   Other Topics Concern  . Not on file   Social History Narrative  . No narrative on file    Past Surgical History:  Procedure Laterality Date  . CARDIAC CATHETERIZATION    . CESAREAN SECTION    . CHOLECYSTECTOMY N/A 06/20/2016   Procedure: LAPAROSCOPIC CHOLECYSTECTOMY;  Surgeon: Rolm Bookbinder, MD;  Location: [facility];  Service: General;  Laterality: N/A;  . ESOPHAGEAL MANOMETRY N/A 09/29/2013   Procedure: ESOPHAGEAL MANOMETRY (EM);  Surgeon: Milus Banister, MD;  Location: [facility];  Service: Endoscopy;  Laterality: N/A;    Family History  Problem Relation Age of Onset  . Diabetes Mother   . Heart disease Mother   . Diabetes Father     Allergies  Allergen Reactions  . Losartan Shortness Of Breath  . Oxycodone-Acetaminophen Nausea And Vomiting  . Augmentin [Amoxicillin-Pot Clavulanate] Diarrhea    Current Outpatient Prescriptions on File Prior to Visit  Medication Sig Dispense Refill  . albuterol (PROVENTIL HFA;VENTOLIN HFA) 108 (90 Base) MCG/ACT inhaler Inhale 2 puffs into the lungs every 6 (six) hours as needed for wheezing. 1 Inhaler 3  . amLODipine (NORVASC) 5 MG tablet TAKE 1 TABLET  (5 MG TOTAL) BY MOUTH DAILY. 30 tablet 6  . cetirizine (ZYRTEC) 10 MG tablet Take 1 tablet (10 mg total) by mouth daily. 30 tablet 11  . cyclobenzaprine (FLEXERIL) 5 MG tablet Take 1 tablet (5 mg total) by mouth at bedtime. 10 tablet 0  . diclofenac (VOLTAREN) 75 MG EC tablet Take 1 tablet (75 mg total) by mouth 2 (two) times daily. 60 tablet 1  . Fiber CHEW Chew 1 tablet by mouth daily.    . fluticasone (FLONASE) 50 MCG/ACT nasal spray PLACE 2 SPRAYS INTO BOTH NOSTRILS DAILY. 16 g 4  . glucose blood test strip Use as instructed--- one touch verio test strips 100 each 12  . hydrochlorothiazide (HYDRODIURIL) 12.5 MG tablet Take 1 tablet (12.5 mg total) by mouth daily. 90 tablet 1  . ipratropium-albuterol (DUONEB) 0.5-2.5 (3) MG/3ML SOLN Take 3 mLs by nebulization every 6 (six) hours as needed. 360 mL 3  . meclizine (ANTIVERT) 25 MG tablet Take 1 tablet (25 mg total) by mouth 3 (three) times daily as needed. 45 tablet 3  . metFORMIN (GLUCOPHAGE-XR) 500 MG 24 hr tablet TAKE 1 TABLET (500 MG TOTAL) BY MOUTH DAILY WITH BREAKFAST. 90 tablet 1  . methocarbamol (ROBAXIN) 500 MG tablet Take 1 tablet (500 mg total) by mouth 2 (two) times daily. 20 tablet 0  . mometasone-formoterol (DULERA) 100-5 MCG/ACT AERO Inhale 2 puffs into the lungs 2 (two) times daily. 1 Inhaler 5  . Multiple Vitamin (MULTIVITAMIN) tablet Take 1 tablet by mouth daily.    . NONFORMULARY OR COMPOUNDED ITEM Nebulizer   DX ASTHMA 1 each 0  . omeprazole (PRILOSEC) 20 MG capsule TAKE 1 CAPSULE (20 MG TOTAL) BY MOUTH DAILY. 30 capsule 2  . potassium chloride SA (K-DUR,KLOR-CON) 20 MEQ tablet Take 1 tablet (20 mEq total) by mouth daily. 90 tablet 1  . Spacer/Aero-Holding Chambers (AEROCHAMBER MV) inhaler Use as instructed 1 each 0  . traMADol (ULTRAM) 50 MG tablet TAKE 1 TABLET BY MOUTH EVERY 8 HOURS AS NEEDED 30 tablet 1   No current facility-administered medications on file prior to visit.     BP (!) 117/59   Pulse 75   Temp 97.8 F  (36.6 C) (Oral)   Ht 5\' 4"  (1.626 m)   Wt 224 lb (101.6 kg)   LMP 05/20/2012   SpO2 100%   BMI 38.45 kg/m       Objective:   Physical Exam  General Mental Status- Alert. General Appearance- Not in acute distress.   Skin General: Color- Normal Color. Moisture- Normal Moisture.  Neck Carotid Arteries- Normal color. Moisture- Normal Moisture. No carotid bruits. No JVD.  Chest and Lung Exam Auscultation: Breath Sounds:-Normal.  Cardiovascular Auscultation:Rythm- Regular. Murmurs & Other Heart Sounds:Auscultation of the heart reveals- No Murmurs.  Abdomen Inspection:-Inspeection Normal. Palpation/Percussion:Note:No mass. Palpation and Percussion of the abdomen  reveal- faint tender in umbilical area when she sneezes. When she sneezes will feel transient bulge in her umbilical area that reduces very quickly, Non Distended + BS, no rebound or guarding.   Neurologic Cranial Nerve exam:- CN III-XII intact(No nystagmus), symmetric smile. Strength:- 5/5 equal and symmetric strength both upper and lower extremities.  Lt shoulder- faint pain on palpation. Pain increased on abduction but has good range of motion.      Assessment & Plan:  For your fatigue will get cbc, cmp and tsh.  For insomnia will prescribe ambien limited number one refill. Let us know if not sleeping despite ambien.  Will see how your mood does after you get good night sleep.  For allergies use flonase. But also add astelin. If you get sinus pressure of mucous drainage from nose while on your nasal sprays then notify us. Will rx antibiotic.  For umbilical pain while sneezing refer back to your surgeon. I think you have small umbilical hernia which causes pain on sneezing.  For left shoulder pain will get xray of shoulder. Refill you diclofenac. Tramadol. If you need recurrent refills/chronic pain then would need to follow [place] controlled med guidelines.   Follow up in 10-14 days or as needed  Note epic  states pt already had tramdol rx printed. So I am not filling this today. But LPN is investigating. I asked her to cooridinate with Ronaldo Miyamoto MA since Dr. Etter Sjogren is not here.     Paola Flynt, Percell Miller, PA-C

## 2016-08-21 NOTE — Patient Instructions (Addendum)
For your fatigue will get cbc, cmp and tsh.  For insomnia will prescribe ambien limited number with one refill. Let us know if not sleeping despite ambien.  Will see how your mood does after you get good night sleep.  For allergies use flonase. But also add astelin. If you get sinus pressure of mucous drainage from nose while on your nasal sprays then notify us. Will rx antibiotic.  For umbilical pain while sneezing refer back to your surgeon. I think you have small umbilical hernia which causes pain on sneezing.  For left shoulder pain will get xray of shoulder. Refill you diclofenac. Tramadol already filled.. If you need recurrent refills/chronic pain then would need to follow [place] controlled med guidelines.  Follow up in 10-14 days or as needed

## 2016-08-22 LAB — CBC WITH DIFFERENTIAL/PLATELET
BASOS PCT: 0.8 % (ref 0.0–3.0)
Basophils Absolute: 0.1 10*3/uL (ref 0.0–0.1)
EOS PCT: 1.1 % (ref 0.0–5.0)
Eosinophils Absolute: 0.1 10*3/uL (ref 0.0–0.7)
HEMATOCRIT: 42.9 % (ref 36.0–46.0)
Hemoglobin: 14.3 g/dL (ref 12.0–15.0)
LYMPHS PCT: 22.6 % (ref 12.0–46.0)
Lymphs Abs: 2 10*3/uL (ref 0.7–4.0)
MCHC: 33.3 g/dL (ref 30.0–36.0)
MCV: 82.4 fl (ref 78.0–100.0)
MONOS PCT: 9.3 % (ref 3.0–12.0)
Monocytes Absolute: 0.8 10*3/uL (ref 0.1–1.0)
NEUTROS ABS: 5.9 10*3/uL (ref 1.4–7.7)
Neutrophils Relative %: 66.2 % (ref 43.0–77.0)
PLATELETS: 250 10*3/uL (ref 150.0–400.0)
RBC: 5.2 Mil/uL — ABNORMAL HIGH (ref 3.87–5.11)
RDW: 14.8 % (ref 11.5–15.5)
WBC: 8.9 10*3/uL (ref 4.0–10.5)

## 2016-08-22 LAB — COMPREHENSIVE METABOLIC PANEL
ALT: 20 U/L (ref 0–35)
AST: 20 U/L (ref 0–37)
Albumin: 4.1 g/dL (ref 3.5–5.2)
Alkaline Phosphatase: 67 U/L (ref 39–117)
BILIRUBIN TOTAL: 0.4 mg/dL (ref 0.2–1.2)
BUN: 23 mg/dL (ref 6–23)
CALCIUM: 10.2 mg/dL (ref 8.4–10.5)
CO2: 29 meq/L (ref 19–32)
Chloride: 103 mEq/L (ref 96–112)
Creatinine, Ser: 0.96 mg/dL (ref 0.40–1.20)
GFR: 78.2 mL/min (ref >60.00)
Glucose, Bld: 91 mg/dL (ref 70–99)
Potassium: 3.9 mEq/L (ref 3.5–5.1)
Sodium: 140 mEq/L (ref 135–145)
Total Protein: 8 g/dL (ref 6.0–8.3)

## 2016-08-22 LAB — TSH: TSH: 1.01 u[IU]/mL (ref 0.35–4.50)

## 2016-09-01 ENCOUNTER — Ambulatory Visit (INDEPENDENT_AMBULATORY_CARE_PROVIDER_SITE_OTHER): Payer: Federal, State, Local not specified - PPO | Admitting: Family Medicine

## 2016-09-01 ENCOUNTER — Ambulatory Visit: Payer: Self-pay | Admitting: Family Medicine

## 2016-09-01 ENCOUNTER — Encounter: Payer: Self-pay | Admitting: Family Medicine

## 2016-09-01 VITALS — BP 122/90 | HR 76 | Temp 97.9°F | Resp 16 | Ht 64.0 in | Wt 228.4 lb

## 2016-09-01 DIAGNOSIS — M25512 Pain in left shoulder: Secondary | ICD-10-CM

## 2016-09-01 DIAGNOSIS — J301 Allergic rhinitis due to pollen: Secondary | ICD-10-CM

## 2016-09-01 DIAGNOSIS — I10 Essential (primary) hypertension: Secondary | ICD-10-CM | POA: Diagnosis not present

## 2016-09-01 DIAGNOSIS — Z889 Allergy status to unspecified drugs, medicaments and biological substances status: Secondary | ICD-10-CM

## 2016-09-01 MED ORDER — CETIRIZINE HCL 10 MG PO TABS: 10.0000 mg | ORAL_TABLET | Freq: Every day | ORAL | 11 refills | Status: DC

## 2016-09-01 MED ORDER — AMLODIPINE BESYLATE 5 MG PO TABS: ORAL_TABLET | ORAL | 1 refills | Status: DC

## 2016-09-01 MED ORDER — FLUTICASONE PROPIONATE 50 MCG/ACT NA SUSP: NASAL | 4 refills | Status: DC

## 2016-09-01 MED ORDER — TRAMADOL HCL 50 MG PO TABS: 50.0000 mg | ORAL_TABLET | Freq: Three times a day (TID) | ORAL | 1 refills | Status: DC | PRN

## 2016-09-01 MED ORDER — AZELASTINE HCL 0.1 % NA SOLN: 2.0000 | Freq: Two times a day (BID) | NASAL | 3 refills | Status: DC

## 2016-09-01 NOTE — Patient Instructions (Signed)

## 2016-09-01 NOTE — Progress Notes (Signed)
Patient ID: Rhonda Morrow, female    DOB: January 10, 1961  Age: 55 y.o. MRN: [id number]    Subjective:  Subjective  HPI Rhonda Morrow presents for f/u L shoulder pain.  Pt has improved some but she still does not have full ROM.  Pt has not been back to Dr Rhonda Morrow because of the [place] surgery.    Review of Systems  Constitutional: Negative for appetite change, diaphoresis, fatigue and unexpected weight change.  Eyes: Negative for pain, redness and visual disturbance.  Respiratory: Negative for cough, chest tightness, shortness of breath and wheezing.   Cardiovascular: Negative for chest pain, palpitations and leg swelling.  Endocrine: Negative for cold intolerance, heat intolerance, polydipsia, polyphagia and polyuria.  Genitourinary: Negative for difficulty urinating, dysuria and frequency.  Musculoskeletal:       L shoulder pain with dec rom  Neurological: Negative for dizziness, light-headedness, numbness and headaches.    History Past Medical History:  Diagnosis Date  . Asthma   . Complication of anesthesia    one time woke up and was vey scared,17 yrs ago  . Deaf   . Diabetes mellitus without complication (Rhonda Morrow)   . GERD (gastroesophageal reflux disease)   . Hypertension   . Left groin pain   . Thyroid disease     She has a past surgical history that includes Cesarean section; Cardiac catheterization; Esophageal manometry (N/A, 09/29/2013); and Cholecystectomy (N/A, 06/20/2016).   Her family history includes Diabetes in her father and mother; Heart disease in her mother.She reports that she has never smoked. She has never used smokeless tobacco. She reports that she does not drink alcohol or use drugs.  Current Outpatient Prescriptions on File Prior to Visit  Medication Sig Dispense Refill  . albuterol (PROVENTIL HFA;VENTOLIN HFA) 108 (90 Base) MCG/ACT inhaler Inhale 2 puffs into the lungs every 6 (six) hours as needed for wheezing. 1 Inhaler 3  . diclofenac (VOLTAREN) 75 MG EC tablet  Take 1 tablet (75 mg total) by mouth 2 (two) times daily. 30 tablet 1  . Fiber CHEW Chew 1 tablet by mouth daily.    Marland Kitchen glucose blood test strip Use as instructed--- one touch verio test strips 100 each 12  . hydrochlorothiazide (HYDRODIURIL) 12.5 MG tablet Take 1 tablet (12.5 mg total) by mouth daily. 90 tablet 1  . ipratropium-albuterol (DUONEB) 0.5-2.5 (3) MG/3ML SOLN Take 3 mLs by nebulization every 6 (six) hours as needed. 360 mL 3  . meclizine (ANTIVERT) 25 MG tablet Take 1 tablet (25 mg total) by mouth 3 (three) times daily as needed. 45 tablet 3  . metFORMIN (GLUCOPHAGE-XR) 500 MG 24 hr tablet TAKE 1 TABLET (500 MG TOTAL) BY MOUTH DAILY WITH BREAKFAST. 90 tablet 1  . methocarbamol (ROBAXIN) 500 MG tablet Take 1 tablet (500 mg total) by mouth 2 (two) times daily. 20 tablet 0  . mometasone-formoterol (DULERA) 100-5 MCG/ACT AERO Inhale 2 puffs into the lungs 2 (two) times daily. 1 Inhaler 5  . Multiple Vitamin (MULTIVITAMIN) tablet Take 1 tablet by mouth daily.    . NONFORMULARY OR COMPOUNDED ITEM Nebulizer   DX ASTHMA 1 each 0  . omeprazole (PRILOSEC) 20 MG capsule Take 1 capsule (20 mg total) by mouth daily. 30 capsule 2  . potassium chloride SA (K-DUR,KLOR-CON) 20 MEQ tablet Take 1 tablet (20 mEq total) by mouth daily. 90 tablet 1  . Spacer/Aero-Holding Chambers (AEROCHAMBER MV) inhaler Use as instructed 1 each 0  . zolpidem (AMBIEN) 10 MG tablet Take 1 tablet (10 mg  total) by mouth at bedtime as needed for sleep. 15 tablet 1   No current facility-administered medications on file prior to visit.      Objective:  Objective  Physical Exam  Constitutional: She is oriented to person, place, and time. She appears well-developed and well-nourished.  HENT:  Head: Normocephalic and atraumatic.  Eyes: Conjunctivae and EOM are normal.  Neck: Normal range of motion. Neck supple. No JVD present. Carotid bruit is not present. No thyromegaly present.  Cardiovascular: Normal rate, regular rhythm  and normal heart sounds.   No murmur heard. Pulmonary/Chest: Effort normal and breath sounds normal. No respiratory distress. She has no wheezes. She has no rales. She exhibits no tenderness.  Musculoskeletal: She exhibits tenderness. She exhibits no edema.       Left shoulder: She exhibits decreased range of motion and tenderness.  Neurological: She is alert and oriented to person, place, and time.  Psychiatric: She has a normal mood and affect.  Nursing note and vitals reviewed.  BP 122/90 (BP Location: Left Arm, Patient Position: Sitting, Cuff Size: Normal)   Pulse 76   Temp 97.9 F (36.6 C) (Oral)   Resp 16   Ht 5\' 4"  (1.626 m)   Wt 228 lb 6.4 oz (103.6 kg)   LMP 05/20/2012   SpO2 98%   BMI 39.20 kg/m  Wt Readings from Last 3 Encounters:  09/01/16 228 lb 6.4 oz (103.6 kg)  08/21/16 224 lb (101.6 kg)  08/01/16 225 lb (102.1 kg)     Lab Results  Component Value Date   WBC 8.9 08/21/2016   HGB 14.3 08/21/2016   HCT 42.9 08/21/2016   PLT 250.0 08/21/2016   GLUCOSE 91 08/21/2016   CHOL 148 04/24/2016   TRIG 84.0 04/24/2016   HDL 52.20 04/24/2016   LDLCALC 79 04/24/2016   ALT 20 08/21/2016   AST 20 08/21/2016   NA 140 08/21/2016   K 3.9 08/21/2016   CL 103 08/21/2016   CREATININE 0.96 08/21/2016   BUN 23 08/21/2016   CO2 29 08/21/2016   TSH 1.01 08/21/2016   INR 1.00 12/30/2012   HGBA1C 6.4 04/24/2016   MICROALBUR 0.8 10/27/2015    Dg Shoulder Left  Result Date: 08/21/2016 CLINICAL DATA:  Acute onset of left lateral shoulder pain. Initial encounter. EXAM: LEFT SHOULDER - 2+ VIEW COMPARISON:  Left shoulder radiographs performed 06/24/2015 FINDINGS: There is no evidence of fracture or dislocation. The left humeral head is seated within the glenoid fossa. The acromioclavicular joint is unremarkable in appearance. No significant soft tissue abnormalities are seen. The visualized portions of the left lung are clear. IMPRESSION: No evidence of fracture or dislocation.  Electronically Signed   By: Rhonda Morrow M.D.   On: 08/21/2016 18:12     Assessment & Plan:  Plan  I have changed Rhonda Morrow's traMADol. I am also having her maintain her meclizine, multivitamin, Fiber, methocarbamol, hydrochlorothiazide, ipratropium-albuterol, glucose blood, NONFORMULARY OR COMPOUNDED ITEM, albuterol, potassium chloride SA, AEROCHAMBER MV, metFORMIN, mometasone-formoterol, omeprazole, diclofenac, zolpidem, azelastine, cetirizine, fluticasone, and amLODipine.  Meds ordered this encounter  Medications  . traMADol (ULTRAM) 50 MG tablet    Sig: Take 1 tablet (50 mg total) by mouth every 8 (eight) hours as needed.    Dispense:  30 tablet    Refill:  1    Not to exceed 4 additional fills before 12/10/2016.  Marland Kitchen azelastine (ASTELIN) 0.1 % nasal spray    Sig: Place 2 sprays into both nostrils 2 (two) times daily.  Use in each nostril as directed    Dispense:  30 mL    Refill:  3  . cetirizine (ZYRTEC) 10 MG tablet    Sig: Take 1 tablet (10 mg total) by mouth daily.    Dispense:  30 tablet    Refill:  11  . fluticasone (FLONASE) 50 MCG/ACT nasal spray    Sig: PLACE 2 SPRAYS INTO BOTH NOSTRILS DAILY.    Dispense:  16 g    Refill:  4  . amLODipine (NORVASC) 5 MG tablet    Sig: TAKE 1 TABLET (5 MG TOTAL) BY MOUTH DAILY.    Dispense:  90 tablet    Refill:  1    Problem List Items Addressed This Visit      Unprioritized   Allergic rhinitis   Relevant Medications   azelastine (ASTELIN) 0.1 % nasal spray   fluticasone (FLONASE) 50 MCG/ACT nasal spray   HTN (hypertension)   Relevant Medications   amLODipine (NORVASC) 5 MG tablet    Other Visit Diagnoses    Left shoulder pain, unspecified chronicity    -  Primary   Relevant Medications   traMADol (ULTRAM) 50 MG tablet   Other Relevant Orders   Ambulatory referral to Sports Medicine   Multiple allergies       Relevant Medications   cetirizine (ZYRTEC) 10 MG tablet      Follow-up: Return in about 3 months (around  12/02/2016) for hypertension, hyperlipidemia, diabetes II.  Ann Held, DO

## 2016-09-01 NOTE — Progress Notes (Signed)
Pre visit review using our clinic review tool, if applicable. No additional management support is needed unless otherwise documented below in the visit note. 

## 2016-09-05 ENCOUNTER — Ambulatory Visit: Payer: Self-pay | Admitting: Family Medicine

## 2016-09-11 ENCOUNTER — Ambulatory Visit: Payer: Self-pay | Admitting: Family Medicine

## 2016-09-12 ENCOUNTER — Ambulatory Visit (INDEPENDENT_AMBULATORY_CARE_PROVIDER_SITE_OTHER): Payer: Federal, State, Local not specified - PPO | Admitting: Family Medicine

## 2016-09-12 ENCOUNTER — Encounter: Payer: Self-pay | Admitting: Family Medicine

## 2016-09-12 VITALS — BP 126/82 | HR 87 | Ht 64.0 in | Wt 222.0 lb

## 2016-09-12 DIAGNOSIS — M67912 Unspecified disorder of synovium and tendon, left shoulder: Secondary | ICD-10-CM

## 2016-09-12 DIAGNOSIS — M25512 Pain in left shoulder: Secondary | ICD-10-CM | POA: Diagnosis not present

## 2016-09-12 MED ORDER — METHYLPREDNISOLONE ACETATE 40 MG/ML IJ SUSP
40.0000 mg | Freq: Once | INTRAMUSCULAR | Status: AC
  Administered 2016-09-12: 40 mg

## 2016-09-12 MED ORDER — METHYLPREDNISOLONE ACETATE 40 MG/ML IJ SUSP
40.0000 mg | Freq: Once | INTRAMUSCULAR | Status: AC
  Administered 2016-09-12: 40 mg via INTRA_ARTICULAR

## 2016-09-12 NOTE — Patient Instructions (Signed)
You have rotator cuff impingement and a frozen shoulder (adhesive capsulitis), a buildup of scar tissue that limits motion of the shoulder joint. Limit lifting and overhead activities as much as possible. Heat 15 minutes at a time 3-4 times a day may help with movement and stiffness. Diclofenac twice a day with food. Steroid injections in a series have been shown to help with pain and motion - you were given this today.. Codman exercises (arm swings, table slides, arm circles) - do 3 sets of 10 once or twice a day. Physical therapy for rotator cuff strengthening is a consideration once you are out of the painful phase Follow up in 6 weeks.

## 2016-09-14 NOTE — Progress Notes (Signed)
PCP: Ann Held, DO Referred by Dr. Larose Kells  Subjective:   HPI: Patient is a 55 y.o. female here for left shoulder injury.  8/18: Interpreter used throughout visit. Patient reports injury occurred on 7/29 at work. She drives a forklift at work. Felt a pop in left shoulder while doing so - mentioned some of equipment was broken and she did not realize this. Seemed to be mild but later that day when doing sign language she felt a lot of pain in the left shoulder. Pain currently 7/10. Tried aleve, advil, icing. Has not had radiographs. + night pain. Is right handed. Remotely  Had problems with this joint - had an injection and completely improved.  9/22: Interpreter present for visit. Patient reports pain still 6/10 at work, 5/10 at rest. Has not heard yet about physical therapy. No other changes.  11/7: Interpreter present. Unfortunately patient still has not heard about physical therapy. Pain level 5/10. No skin changes, fever, other complaints. Using pain pills, occasional ice. Doing home exercises.  12/19: Patient reports only slight improvement from last visit - 4/10 level pain, dull lateral left shoulder. Is taking voltaren but needs a refill - this helps. Avoiding overhead activities when possible because they bother her. Doing home exercises. Has not heard from workers comp regarding physical therapy. No skin changes, other complaints.  12/06/15: Patient feels mildly improved though still has not had physical therapy approved nor an MRI. Pain level 2/10 at rest, dull lateral left shoulder. Worse with overhead motions. No skin changes, other complaints. Doing home exercises.  3/30: Patient reports she has mainly been resting her shoulder now. No pain currently - gets some soreness with full motions. Pain level 0/10. No skin changes, fever, other complaints. No radiation.  5/15: Patient reports she feels much better. Still notices pain especially  with weather change, a lot of movement. Pain currently 0/10. Taking diclofenac as needed. No skin changes, fever, other complaints.  9/5: Patient reports she's doing well. No pain currently in left shoulder. Not doing home exercises because she's resting from recent gall bladder surgery. No skin changes, numbness.  11/7: Patient returns with 4 days of worsening left shoulder pain. She had been doing extremely well then reports she 'slept wrong' 4 days ago. Since then has had pain lateral left shoulder, upper arm. Has been icing, taking voltaren and tramadol Worse trying to reach overhead and forward, lying on side. Pain 5/10, sharp. No skin changes, numbness.  Past Medical History:  Diagnosis Date  . Asthma   . Complication of anesthesia    one time woke up and was vey scared,17 yrs ago  . Deaf   . Diabetes mellitus without complication ([place])   . GERD (gastroesophageal reflux disease)   . Hypertension   . Left groin pain   . Thyroid disease     Current Outpatient Prescriptions on File Prior to Visit  Medication Sig Dispense Refill  . albuterol (PROVENTIL HFA;VENTOLIN HFA) 108 (90 Base) MCG/ACT inhaler Inhale 2 puffs into the lungs every 6 (six) hours as needed for wheezing. 1 Inhaler 3  . amLODipine (NORVASC) 5 MG tablet TAKE 1 TABLET (5 MG TOTAL) BY MOUTH DAILY. 90 tablet 1  . azelastine (ASTELIN) 0.1 % nasal spray Place 2 sprays into both nostrils 2 (two) times daily. Use in each nostril as directed 30 mL 3  . cetirizine (ZYRTEC) 10 MG tablet Take 1 tablet (10 mg total) by mouth daily. 30 tablet 11  . diclofenac (VOLTAREN)  75 MG EC tablet Take 1 tablet (75 mg total) by mouth 2 (two) times daily. 30 tablet 1  . Fiber CHEW Chew 1 tablet by mouth daily.    . fluticasone (FLONASE) 50 MCG/ACT nasal spray PLACE 2 SPRAYS INTO BOTH NOSTRILS DAILY. 16 g 4  . glucose blood test strip Use as instructed--- one touch verio test strips 100 each 12  . hydrochlorothiazide (HYDRODIURIL)  12.5 MG tablet Take 1 tablet (12.5 mg total) by mouth daily. 90 tablet 1  . ipratropium-albuterol (DUONEB) 0.5-2.5 (3) MG/3ML SOLN Take 3 mLs by nebulization every 6 (six) hours as needed. 360 mL 3  . meclizine (ANTIVERT) 25 MG tablet Take 1 tablet (25 mg total) by mouth 3 (three) times daily as needed. 45 tablet 3  . metFORMIN (GLUCOPHAGE-XR) 500 MG 24 hr tablet TAKE 1 TABLET (500 MG TOTAL) BY MOUTH DAILY WITH BREAKFAST. 90 tablet 1  . methocarbamol (ROBAXIN) 500 MG tablet Take 1 tablet (500 mg total) by mouth 2 (two) times daily. 20 tablet 0  . mometasone-formoterol (DULERA) 100-5 MCG/ACT AERO Inhale 2 puffs into the lungs 2 (two) times daily. 1 Inhaler 5  . Multiple Vitamin (MULTIVITAMIN) tablet Take 1 tablet by mouth daily.    . NONFORMULARY OR COMPOUNDED ITEM Nebulizer   DX ASTHMA 1 each 0  . omeprazole (PRILOSEC) 20 MG capsule Take 1 capsule (20 mg total) by mouth daily. 30 capsule 2  . potassium chloride SA (K-DUR,KLOR-CON) 20 MEQ tablet Take 1 tablet (20 mEq total) by mouth daily. 90 tablet 1  . Spacer/Aero-Holding Chambers (AEROCHAMBER MV) inhaler Use as instructed 1 each 0  . traMADol (ULTRAM) 50 MG tablet Take 1 tablet (50 mg total) by mouth every 8 (eight) hours as needed. 30 tablet 1  . zolpidem (AMBIEN) 10 MG tablet Take 1 tablet (10 mg total) by mouth at bedtime as needed for sleep. 15 tablet 1   No current facility-administered medications on file prior to visit.     Past Surgical History:  Procedure Laterality Date  . CARDIAC CATHETERIZATION    . CESAREAN SECTION    . CHOLECYSTECTOMY N/A 06/20/2016   Procedure: LAPAROSCOPIC CHOLECYSTECTOMY;  Surgeon: Rolm Bookbinder, MD;  Location: [place];  Service: General;  Laterality: N/A;  . ESOPHAGEAL MANOMETRY N/A 09/29/2013   Procedure: ESOPHAGEAL MANOMETRY (EM);  Surgeon: Milus Banister, MD;  Location: [facility];  Service: Endoscopy;  Laterality: N/A;    Allergies  Allergen Reactions  . Losartan Shortness Of Breath  .  Oxycodone-Acetaminophen Nausea And Vomiting  . Augmentin [Amoxicillin-Pot Clavulanate] Diarrhea    Social History   Social History  . Marital status: Married    Spouse name: N/A  . Number of children: N/A  . Years of education: N/A   Occupational History  . diabled    Social History Main Topics  . Smoking status: Never Smoker  . Smokeless tobacco: Never Used  . Alcohol use No  . Drug use: No  . Sexual activity: No   Other Topics Concern  . Not on file   Social History Narrative  . No narrative on file    Family History  Problem Relation Age of Onset  . Diabetes Mother   . Heart disease Mother   . Diabetes Father     BP 126/82   Pulse 87   Ht 5\' 4"  (1.626 m)   Wt 222 lb (100.7 kg)   LMP 05/20/2012   BMI 38.11 kg/m   Review of Systems: See HPI above.  Objective:  Physical Exam:  Gen: NAD  Left shoulder: No swelling, ecchymoses.  No gross deformity. No TTP. Only 50 degrees ER, 90 abduction, 120 flexion - all painful. Positive Hawkins, Neers. Negative Yergasons. Strength 4/5 with empty can and 5/5 resisted internal/external rotation. Negative apprehension. NV intact distally.  Right shoulder: FROM without pain.    Assessment & Plan:  1. Left shoulder injury - current issue due to rotator cuff impingement and developing adhesive capsulitis.  Continue voltaren.  Heat, shown codman exercises to help regain motion.  Combination injection given today.  F/u in 6 weeks.    After informed written consent, patient was seated on exam table. Left shoulder was prepped with alcohol swab and utilizing posterior approach, patient's left shoulder was injected with 6:2 marcaine:depomedrol with half in the subacromial space and half in glenohumeral space.  Patient tolerated the procedure well without immediate complications.

## 2016-09-14 NOTE — Assessment & Plan Note (Signed)
current issue due to rotator cuff impingement and developing adhesive capsulitis.  Continue voltaren.  Heat, shown codman exercises to help regain motion.  Combination injection given today.  F/u in 6 weeks.    After informed written consent, patient was seated on exam table. Left shoulder was prepped with alcohol swab and utilizing posterior approach, patient's left shoulder was injected with 6:2 marcaine:depomedrol with half in the subacromial space and half in glenohumeral space.  Patient tolerated the procedure well without immediate complications.

## 2016-09-23 ENCOUNTER — Other Ambulatory Visit: Payer: Self-pay | Admitting: Family Medicine

## 2016-09-23 DIAGNOSIS — E1151 Type 2 diabetes mellitus with diabetic peripheral angiopathy without gangrene: Secondary | ICD-10-CM

## 2016-09-23 DIAGNOSIS — IMO0002 Reserved for concepts with insufficient information to code with codable children: Secondary | ICD-10-CM

## 2016-09-23 DIAGNOSIS — E1165 Type 2 diabetes mellitus with hyperglycemia: Principal | ICD-10-CM

## 2016-09-25 NOTE — Telephone Encounter (Signed)
Refill sent per [facility] refill protocol/SLS  

## 2016-10-09 ENCOUNTER — Ambulatory Visit: Payer: Self-pay | Admitting: Medical

## 2016-10-10 ENCOUNTER — Telehealth: Payer: Self-pay | Admitting: Medical

## 2016-10-10 ENCOUNTER — Ambulatory Visit (HOSPITAL_BASED_OUTPATIENT_CLINIC_OR_DEPARTMENT_OTHER)
Admission: RE | Admit: 2016-10-10 | Discharge: 2016-10-10 | Disposition: A | Discharge status: Home / Self Care | Payer: Federal, State, Local not specified - PPO | Source: Ambulatory Visit | Attending: Medical | Admitting: Medical

## 2016-10-10 ENCOUNTER — Ambulatory Visit (INDEPENDENT_AMBULATORY_CARE_PROVIDER_SITE_OTHER): Payer: Federal, State, Local not specified - PPO | Admitting: Medical

## 2016-10-10 ENCOUNTER — Encounter: Payer: Self-pay | Admitting: Medical

## 2016-10-10 VITALS — BP 122/80 | HR 75 | Temp 97.6°F | Ht 64.0 in | Wt 227.2 lb

## 2016-10-10 DIAGNOSIS — R4182 Altered mental status, unspecified: Secondary | ICD-10-CM | POA: Diagnosis not present

## 2016-10-10 DIAGNOSIS — R51 Headache: Secondary | ICD-10-CM

## 2016-10-10 DIAGNOSIS — R0600 Dyspnea, unspecified: Secondary | ICD-10-CM

## 2016-10-10 DIAGNOSIS — J3489 Other specified disorders of nose and nasal sinuses: Secondary | ICD-10-CM | POA: Diagnosis not present

## 2016-10-10 DIAGNOSIS — I517 Cardiomegaly: Secondary | ICD-10-CM | POA: Diagnosis not present

## 2016-10-10 DIAGNOSIS — R109 Unspecified abdominal pain: Secondary | ICD-10-CM

## 2016-10-10 DIAGNOSIS — E118 Type 2 diabetes mellitus with unspecified complications: Secondary | ICD-10-CM

## 2016-10-10 DIAGNOSIS — R519 Headache, unspecified: Secondary | ICD-10-CM

## 2016-10-10 LAB — COMPREHENSIVE METABOLIC PANEL
ALK PHOS: 89 U/L (ref 39–117)
ALT: 21 U/L (ref 0–35)
AST: 15 U/L (ref 0–37)
Albumin: 3.7 g/dL (ref 3.5–5.2)
BILIRUBIN TOTAL: 0.3 mg/dL (ref 0.2–1.2)
BUN: 16 mg/dL (ref 6–23)
CALCIUM: 9.4 mg/dL (ref 8.4–10.5)
CO2: 33 meq/L — AB (ref 19–32)
Chloride: 102 mEq/L (ref 96–112)
Creatinine, Ser: 0.81 mg/dL (ref 0.40–1.20)
GFR: 95.09 mL/min (ref >60.00)
Glucose, Bld: 111 mg/dL — ABNORMAL HIGH (ref 70–99)
Potassium: 4.2 mEq/L (ref 3.5–5.1)
Sodium: 140 mEq/L (ref 135–145)
Total Protein: 7.1 g/dL (ref 6.0–8.3)

## 2016-10-10 LAB — POC URINALSYSI DIPSTICK (AUTOMATED)
Bilirubin, UA: NEGATIVE
Glucose, UA: NEGATIVE
KETONES UA: NEGATIVE
LEUKOCYTES UA: NEGATIVE
Nitrite, UA: NEGATIVE
PH UA: 6
PROTEIN UA: NEGATIVE
RBC UA: NEGATIVE
SPEC GRAV UA: 1.025
Urobilinogen, UA: 1

## 2016-10-10 LAB — CBC WITH DIFFERENTIAL/PLATELET
BASOS ABS: 0 10*3/uL (ref 0.0–0.1)
BASOS PCT: 0.4 % (ref 0.0–3.0)
Eosinophils Absolute: 0.1 10*3/uL (ref 0.0–0.7)
Eosinophils Relative: 1.8 % (ref 0.0–5.0)
HEMATOCRIT: 40.2 % (ref 36.0–46.0)
Hemoglobin: 13.3 g/dL (ref 12.0–15.0)
LYMPHS PCT: 28.6 % (ref 12.0–46.0)
Lymphs Abs: 2.1 10*3/uL (ref 0.7–4.0)
MCHC: 33.1 g/dL (ref 30.0–36.0)
MCV: 82.7 fl (ref 78.0–100.0)
MONOS PCT: 7.5 % (ref 3.0–12.0)
Monocytes Absolute: 0.5 10*3/uL (ref 0.1–1.0)
NEUTROS ABS: 4.4 10*3/uL (ref 1.4–7.7)
Neutrophils Relative %: 61.7 % (ref 43.0–77.0)
PLATELETS: 226 10*3/uL (ref 150.0–400.0)
RBC: 4.86 Mil/uL (ref 3.87–5.11)
RDW: 14.8 % (ref 11.5–15.5)
WBC: 7.2 10*3/uL (ref 4.0–10.5)

## 2016-10-10 LAB — HEMOGLOBIN A1C: HEMOGLOBIN A1C: 6.4 % (ref 4.6–6.5)

## 2016-10-10 LAB — BRAIN NATRIURETIC PEPTIDE: PRO B NATRI PEPTIDE: 21 pg/mL (ref 0.0–100.0)

## 2016-10-10 MED ORDER — RANITIDINE HCL 150 MG PO CAPS: 150.0000 mg | ORAL_CAPSULE | Freq: Two times a day (BID) | ORAL | 0 refills | Status: DC

## 2016-10-10 MED ORDER — CEPHALEXIN 500 MG PO CAPS: 500.0000 mg | ORAL_CAPSULE | Freq: Two times a day (BID) | ORAL | 0 refills | Status: DC

## 2016-10-10 NOTE — Telephone Encounter (Signed)
Pt had ua I office today. Will you coordinate with holden. I also wanted her to have culture. She had abdomen pain(direct suprapubic area). Can associate to that.

## 2016-10-10 NOTE — Patient Instructions (Addendum)
For you abdomen pain will get urine sample, urine culture and labs. Also get abdomen xray today. Could add ranitidine.   For sinus pressure/possible infection and possible uti pending culture rx keflex.  For ha with some worrisome features will get ct of head today.  For sob/dyspnea  lying supine get cxr and heart failure protein test.  Will get a1c today to check your blood sugar average.   If you get any severe ha, more constant ha or neurologic signs or symptoms then ED evaluation.  Any  Severe abdomen pain pending labs then ED evaluation as well.  Follow up in 5 days or as needed

## 2016-10-10 NOTE — Telephone Encounter (Signed)
Pt gave a urine today. Saw result. But also want to get culture. Associate with abdomen pain. Thanks.

## 2016-10-10 NOTE — Telephone Encounter (Signed)
UA & Culture were both porcessed today. Urine Culture was picked up by [facility] courier with the 1200 pick up. KMP

## 2016-10-10 NOTE — Progress Notes (Signed)
Pre visit review using our clinic review tool, if applicable. No additional management support is needed unless otherwise documented below in the visit note. 

## 2016-10-10 NOTE — Progress Notes (Signed)
Subjective:    Patient ID: Rhonda Morrow, female    DOB: [birth year], 55 y.o.   MRN: [id number]  HPI  Pt in for evaluation.  Pt has some lower abdomen pain. She points to her mid suprapubic area. Pain over last week.  No pain on urination. Some pain on sneezing. Pt associates her face feeling warm with pain. No chills or sweats. Pt has no diarrhea. At times she feels constipated. Pt last bowel movement was yesterday. She does not describe constipation yesterday. But she did use otc stool softener. At times passes small hard stool. No nausea or vomiting. No blood reported in stool. No diarrhea on review. No report of cva pain.  Pt also states heated sensation to her face past friday. She feeling mild confusion on friday when rt side of her face felt warm sensation and headache.(she states pain and points to her rt parietal at time her face felt warm and she felt briefly confused.) Again heated sensation to face, rt parietal area pain and felt confusion was on Friday. Pt used some nasal spray on Friday thinking may have been sinus related. Pain would last for 5-10 minute brief episodes. Pt states by Saturday morning pain stopped. She mentioned runny nose weekend.   Today during exam felt rt parietal region pain for about 10 -15 seconds. Then subsided. She did not have warm face sensation. Nor did she have confusion today. No gross motor or sensory function deficits with transient sharp parieteal pain today.  Some nasal congestion over past week.  At end of interview she did not occasional sob on lying supine. Noticed just past week.  Review of Systems  HENT: Positive for congestion and sinus pressure. Negative for sinus pain and sneezing.   Cardiovascular: Negative for chest pain and palpitations.  Gastrointestinal: Positive for abdominal pain. Negative for abdominal distention, blood in stool, constipation, diarrhea, nausea and vomiting.  Genitourinary: Negative for decreased urine volume,  difficulty urinating, dysuria, frequency, hematuria, urgency, vaginal bleeding and vaginal pain.       When sneezes occasional leakage of urine. Pt has gyn.  Musculoskeletal: Negative for back pain.  Skin: Negative for rash.  Neurological: Positive for headaches. Negative for syncope, speech difficulty, weakness and light-headedness.       Sharp parietal pain in office today last 10-15 seconds.  Hematological: Negative for adenopathy. Does not bruise/bleed easily.  Psychiatric/Behavioral: Negative for behavioral problems, confusion and suicidal ideas. The patient is not nervous/anxious.     Past Medical History:  Diagnosis Date  . Asthma   . Complication of anesthesia    one time woke up and was vey scared,17 yrs ago  . Deaf   . Diabetes mellitus without complication ([place])   . GERD (gastroesophageal reflux disease)   . Hypertension   . Left groin pain   . Thyroid disease      Social History   Social History  . Marital status: Married    Spouse name: N/A  . Number of children: N/A  . Years of education: N/A   Occupational History  . diabled    Social History Main Topics  . Smoking status: Never Smoker  . Smokeless tobacco: Never Used  . Alcohol use No  . Drug use: No  . Sexual activity: No   Other Topics Concern  . Not on file   Social History Narrative  . No narrative on file    Past Surgical History:  Procedure Laterality Date  . CARDIAC CATHETERIZATION    .  CESAREAN SECTION    . CHOLECYSTECTOMY N/A 06/20/2016   Procedure: LAPAROSCOPIC CHOLECYSTECTOMY;  Surgeon: Rolm Bookbinder, MD;  Location: [place];  Service: General;  Laterality: N/A;  . ESOPHAGEAL MANOMETRY N/A 09/29/2013   Procedure: ESOPHAGEAL MANOMETRY (EM);  Surgeon: Milus Banister, MD;  Location: [facility];  Service: Endoscopy;  Laterality: N/A;    Family History  Problem Relation Age of Onset  . Diabetes Mother   . Heart disease Mother   . Diabetes Father     Allergies  Allergen  Reactions  . Losartan Shortness Of Breath  . Oxycodone-Acetaminophen Nausea And Vomiting  . Augmentin [Amoxicillin-Pot Clavulanate] Diarrhea    Current Outpatient Prescriptions on File Prior to Visit  Medication Sig Dispense Refill  . albuterol (PROVENTIL HFA;VENTOLIN HFA) 108 (90 Base) MCG/ACT inhaler Inhale 2 puffs into the lungs every 6 (six) hours as needed for wheezing. 1 Inhaler 3  . amLODipine (NORVASC) 5 MG tablet TAKE 1 TABLET (5 MG TOTAL) BY MOUTH DAILY. 90 tablet 1  . azelastine (ASTELIN) 0.1 % nasal spray Place 2 sprays into both nostrils 2 (two) times daily. Use in each nostril as directed 30 mL 3  . cetirizine (ZYRTEC) 10 MG tablet Take 1 tablet (10 mg total) by mouth daily. 30 tablet 11  . diclofenac (VOLTAREN) 75 MG EC tablet Take 1 tablet (75 mg total) by mouth 2 (two) times daily. 30 tablet 1  . Fiber CHEW Chew 1 tablet by mouth daily.    . fluticasone (FLONASE) 50 MCG/ACT nasal spray PLACE 2 SPRAYS INTO BOTH NOSTRILS DAILY. 16 g 4  . glucose blood test strip Use as instructed--- one touch verio test strips 100 each 12  . hydrochlorothiazide (HYDRODIURIL) 12.5 MG tablet Take 1 tablet (12.5 mg total) by mouth daily. 90 tablet 1  . ipratropium-albuterol (DUONEB) 0.5-2.5 (3) MG/3ML SOLN Take 3 mLs by nebulization every 6 (six) hours as needed. 360 mL 3  . meclizine (ANTIVERT) 25 MG tablet Take 1 tablet (25 mg total) by mouth 3 (three) times daily as needed. 45 tablet 3  . metFORMIN (GLUCOPHAGE-XR) 500 MG 24 hr tablet TAKE 1 TABLET (500 MG TOTAL) BY MOUTH DAILY WITH BREAKFAST. 90 tablet 1  . methocarbamol (ROBAXIN) 500 MG tablet Take 1 tablet (500 mg total) by mouth 2 (two) times daily. 20 tablet 0  . mometasone-formoterol (DULERA) 100-5 MCG/ACT AERO Inhale 2 puffs into the lungs 2 (two) times daily. 1 Inhaler 5  . Multiple Vitamin (MULTIVITAMIN) tablet Take 1 tablet by mouth daily.    . NONFORMULARY OR COMPOUNDED ITEM Nebulizer   DX ASTHMA 1 each 0  . omeprazole (PRILOSEC) 20  MG capsule Take 1 capsule (20 mg total) by mouth daily. 30 capsule 2  . potassium chloride SA (K-DUR,KLOR-CON) 20 MEQ tablet Take 1 tablet (20 mEq total) by mouth daily. 90 tablet 1  . Spacer/Aero-Holding Chambers (AEROCHAMBER MV) inhaler Use as instructed 1 each 0  . traMADol (ULTRAM) 50 MG tablet Take 1 tablet (50 mg total) by mouth every 8 (eight) hours as needed. 30 tablet 1  . zolpidem (AMBIEN) 10 MG tablet Take 1 tablet (10 mg total) by mouth at bedtime as needed for sleep. 15 tablet 1   No current facility-administered medications on file prior to visit.     BP 122/80 (BP Location: Right Arm, Patient Position: Sitting, Cuff Size: Small)   Pulse 75   Temp 97.6 F (36.4 C) (Oral)   Ht 5\' 4"  (1.626 m)   Wt 227 lb  3.2 oz (103.1 kg)   LMP 05/20/2012   SpO2 99%   BMI 39.00 kg/m       Objective:   Physical Exam  General Mental Status- Alert. General Appearance- Not in acute distress.     HEENT Head- Normal. Ear Auditory Canal - Left- Normal. Right - Normal.Tympanic Membrane- Left- Normal. Right- Normal. Eye Sclera/Conjunctiva- Left- Normal. Right- Normal. Nose & Sinuses Nasal Mucosa- Left-  Boggy and Congested. Right-  Boggy and  Congested.Bilateral  Faint rt maxillary but frontal sinus pressure. Mouth & Throat Lips: Upper Lip- Normal: no dryness, cracking, pallor, cyanosis, or vesicular eruption. Lower Lip-Normal: no dryness, cracking, pallor, cyanosis or vesicular eruption. Buccal Mucosa- Bilateral- No Aphthous ulcers. Oropharynx- No Discharge or Erythema. +pnd. Tonsils: Characteristics- Bilateral- No Erythema or Congestion. Size/Enlargement- Bilateral- No enlargement. Discharge- bilateral-None.    Neck Carotid Arteries- Normal color. Moisture- Normal Moisture. No carotid bruits. No JVD.  Chest and Lung Exam Auscultation: Breath Sounds:-Normal.  Cardiovascular Auscultation:Rythm- Regular. Murmurs & Other Heart Sounds:Auscultation of the heart reveals- No  Murmurs.  Abdomen Inspection:-Inspeection Normal. Palpation/Percussion:Note: No mass. Palpation and Percussion of the abdomen reveal- direct mid suprapubic  Tenderness to palpation., Non Distended + BS, no rebound or guarding. Suprapubic tenderness.   Neurologic Cranial Nerve exam:- CN III-XII intact(No nystagmus), symmetric smile. Drift Test:- No drift. Romberg Exam:- Negative.  Finger to Nose:- Normal/Intact Strength:- 5/5 equal and symmetric strength both upper and lower extremities.  Lower ext- no pedal edema. Neg homans signs.  Back- no cva tenderness.       Assessment & Plan:   For you abdomen pain will get urine sample, urine culture and labs. Also get abdomen xray today. Could add ranitidine.   For sinus pressure/possible infection and possible uti pending culture rx keflex.  For ha with some worrisome features will get ct of head today.  For sob/dyspnea lying supine get cxr and heart failure protein test.  Will get a1c today to check your blood sugar average.   If you get any severe ha, more constant ha or neurologic signs or symptoms then ED evaluation.  Any  Severe abdomen pain pending labs then ED evaluation as well.  Follow up in 5 days or as needed

## 2016-10-11 LAB — URINE CULTURE

## 2016-10-13 ENCOUNTER — Other Ambulatory Visit: Payer: Self-pay | Admitting: Medical

## 2016-10-13 DIAGNOSIS — M25511 Pain in right shoulder: Secondary | ICD-10-CM

## 2016-10-16 ENCOUNTER — Other Ambulatory Visit: Payer: Self-pay

## 2016-10-16 ENCOUNTER — Ambulatory Visit (INDEPENDENT_AMBULATORY_CARE_PROVIDER_SITE_OTHER): Payer: Federal, State, Local not specified - PPO | Admitting: Medical

## 2016-10-16 ENCOUNTER — Encounter: Payer: Self-pay | Admitting: Medical

## 2016-10-16 VITALS — BP 100/70 | HR 73 | Temp 97.6°F | Ht 64.0 in | Wt 225.0 lb

## 2016-10-16 DIAGNOSIS — J0120 Acute ethmoidal sinusitis, unspecified: Secondary | ICD-10-CM | POA: Diagnosis not present

## 2016-10-16 DIAGNOSIS — E119 Type 2 diabetes mellitus without complications: Secondary | ICD-10-CM

## 2016-10-16 DIAGNOSIS — R109 Unspecified abdominal pain: Secondary | ICD-10-CM

## 2016-10-16 DIAGNOSIS — M25552 Pain in left hip: Secondary | ICD-10-CM

## 2016-10-16 DIAGNOSIS — R102 Pelvic and perineal pain: Secondary | ICD-10-CM | POA: Diagnosis not present

## 2016-10-16 DIAGNOSIS — M25512 Pain in left shoulder: Secondary | ICD-10-CM

## 2016-10-16 MED ORDER — GLUCOSE BLOOD VI STRP: ORAL_STRIP | 12 refills | Status: DC

## 2016-10-16 MED ORDER — KETOROLAC TROMETHAMINE 60 MG/2ML IM SOLN
60.0000 mg | Freq: Once | INTRAMUSCULAR | Status: AC
  Administered 2016-10-16: 60 mg via INTRAMUSCULAR

## 2016-10-16 NOTE — Patient Instructions (Addendum)
Your pain in lower abdomen appears to be directly over your bladder area. Your urine showed some bacteria on last check vs contaminated sample. I sent in keflex to your pharmacy please start pending repeat culture.  For your hip pain we gave toradol 60 mg im and ask you not take any diclofenac until Wednesday. Will refer you to prior orthopedist who evaluated you hip pain. I called [facility] [address] and you have refill of tramadol  For your intermittent sinus region pain and ha I want you to go ahead and take the keflex as well. Will see if this clears you intermittent frontal sinus region pain.(and maxillary pressure/pain on exam.) If your pain is still persisting would consider referring you to neurologist since pattern described may be representative of atypical ha.  If any severe abdomen pain or severe ha after hours then ED evaluation.  Follow up in 2 weeks or as needed

## 2016-10-16 NOTE — Progress Notes (Signed)
Subjective:    Patient ID: Rhonda Morrow, female    DOB: November 01, 1961, 55 y.o.   MRN: [id number]  HPI  Pt in for follow up.   Pt is still limping. She still has pain in her left hip area. Pain is same as in the same region as when I referred her to Dr. Felipe Drone. She ran out of tramadol just recently. Ran out yesterday. Pt is still on diclofenac.  Pt did get referred to orthopedist from sports medicine and injection did seem to help in the past.  She still has pain as she had before.   Pt went to PT in the past but had to stop since she had surgery for her gallbladder and hernia in the summer.  Pt needs work note for work missed yesterday.    Pt also had some mild pain over her bladder. She states area where she had prior surgery for hernia. Pt admits last sample may have been contaminated sample. Showed mixed bacteria. She has not gotten keflex yet. No diarrhea reported. No nausea or vomiting.  Pt still has some sinus pressure area pain with warm sensation. I had prescribed keflex antibioitc but she still has not filled the rx. Pt will get headache or pain for about 20 minutes frontal region. Occurs about 3 times a week. On exam today obvious maxillary pressure to palpation.  She states extreme warmth in her face. No sweating.  Pt mensturated in 10 years or more. She describes being post menopause.(no hx of hysterectomy)    Review of Systems  Constitutional: Negative for chills, fatigue and fever.  HENT: Positive for congestion and sinus pain.   Respiratory: Negative for cough, chest tightness, shortness of breath and wheezing.   Cardiovascular: Negative for chest pain and palpitations.  Gastrointestinal: Positive for abdominal pain. Negative for abdominal distention, constipation, diarrhea, nausea, rectal pain and vomiting.       Mid suprapubic area pain. Worse when sneezes at times.  Musculoskeletal: Negative for back pain, myalgias, neck pain and neck stiffness.       Lt hip pain.    Skin: Negative for rash.  Neurological: Negative for dizziness, seizures, syncope, facial asymmetry, weakness and numbness.       Frontal sinus and maxillary pain.  Hematological: Negative for adenopathy. Does not bruise/bleed easily.  Psychiatric/Behavioral: Negative for behavioral problems and confusion.   Past Medical History:  Diagnosis Date  . Asthma   . Complication of anesthesia    one time woke up and was vey scared,17 yrs ago  . Deaf   . Diabetes mellitus without complication ([place])   . GERD (gastroesophageal reflux disease)   . Hypertension   . Left groin pain   . Thyroid disease      Social History   Social History  . Marital status: Married    Spouse name: N/A  . Number of children: N/A  . Years of education: N/A   Occupational History  . diabled    Social History Main Topics  . Smoking status: Never Smoker  . Smokeless tobacco: Never Used  . Alcohol use No  . Drug use: No  . Sexual activity: No   Other Topics Concern  . Not on file   Social History Narrative  . No narrative on file    Past Surgical History:  Procedure Laterality Date  . CARDIAC CATHETERIZATION    . CESAREAN SECTION    . CHOLECYSTECTOMY N/A 06/20/2016   Procedure: LAPAROSCOPIC CHOLECYSTECTOMY;  Surgeon: Rolm Bookbinder,  MD;  Location: [facility];  Service: General;  Laterality: N/A;  . ESOPHAGEAL MANOMETRY N/A 09/29/2013   Procedure: ESOPHAGEAL MANOMETRY (EM);  Surgeon: Milus Banister, MD;  Location: [facility];  Service: Endoscopy;  Laterality: N/A;    Family History  Problem Relation Age of Onset  . Diabetes Mother   . Heart disease Mother   . Diabetes Father     Allergies  Allergen Reactions  . Losartan Shortness Of Breath  . Oxycodone-Acetaminophen Nausea And Vomiting  . Augmentin [Amoxicillin-Pot Clavulanate] Diarrhea    Current Outpatient Prescriptions on File Prior to Visit  Medication Sig Dispense Refill  . albuterol (PROVENTIL HFA;VENTOLIN HFA) 108 (90 Base)  MCG/ACT inhaler Inhale 2 puffs into the lungs every 6 (six) hours as needed for wheezing. 1 Inhaler 3  . amLODipine (NORVASC) 5 MG tablet TAKE 1 TABLET (5 MG TOTAL) BY MOUTH DAILY. 90 tablet 1  . azelastine (ASTELIN) 0.1 % nasal spray Place 2 sprays into both nostrils 2 (two) times daily. Use in each nostril as directed 30 mL 3  . cephALEXin (KEFLEX) 500 MG capsule Take 1 capsule (500 mg total) by mouth 2 (two) times daily. 14 capsule 0  . cetirizine (ZYRTEC) 10 MG tablet Take 1 tablet (10 mg total) by mouth daily. 30 tablet 11  . diclofenac (VOLTAREN) 75 MG EC tablet TAKE 1 TABLET (75 MG TOTAL) BY MOUTH 2 (TWO) TIMES DAILY. 30 tablet 1  . Fiber CHEW Chew 1 tablet by mouth daily.    . fluticasone (FLONASE) 50 MCG/ACT nasal spray PLACE 2 SPRAYS INTO BOTH NOSTRILS DAILY. 16 g 4  . hydrochlorothiazide (HYDRODIURIL) 12.5 MG tablet Take 1 tablet (12.5 mg total) by mouth daily. 90 tablet 1  . ipratropium-albuterol (DUONEB) 0.5-2.5 (3) MG/3ML SOLN Take 3 mLs by nebulization every 6 (six) hours as needed. 360 mL 3  . meclizine (ANTIVERT) 25 MG tablet Take 1 tablet (25 mg total) by mouth 3 (three) times daily as needed. 45 tablet 3  . metFORMIN (GLUCOPHAGE-XR) 500 MG 24 hr tablet TAKE 1 TABLET (500 MG TOTAL) BY MOUTH DAILY WITH BREAKFAST. 90 tablet 1  . methocarbamol (ROBAXIN) 500 MG tablet Take 1 tablet (500 mg total) by mouth 2 (two) times daily. 20 tablet 0  . mometasone-formoterol (DULERA) 100-5 MCG/ACT AERO Inhale 2 puffs into the lungs 2 (two) times daily. 1 Inhaler 5  . Multiple Vitamin (MULTIVITAMIN) tablet Take 1 tablet by mouth daily.    . NONFORMULARY OR COMPOUNDED ITEM Nebulizer   DX ASTHMA 1 each 0  . omeprazole (PRILOSEC) 20 MG capsule Take 1 capsule (20 mg total) by mouth daily. 30 capsule 2  . potassium chloride SA (K-DUR,KLOR-CON) 20 MEQ tablet Take 1 tablet (20 mEq total) by mouth daily. 90 tablet 1  . ranitidine (ZANTAC) 150 MG capsule Take 1 capsule (150 mg total) by mouth 2 (two) times  daily. 60 capsule 0  . Spacer/Aero-Holding Chambers (AEROCHAMBER MV) inhaler Use as instructed 1 each 0  . traMADol (ULTRAM) 50 MG tablet Take 1 tablet (50 mg total) by mouth every 8 (eight) hours as needed. 30 tablet 1  . zolpidem (AMBIEN) 10 MG tablet Take 1 tablet (10 mg total) by mouth at bedtime as needed for sleep. 15 tablet 1   No current facility-administered medications on file prior to visit.     BP 100/70 (BP Location: Left Arm, Patient Position: Sitting, Cuff Size: Normal)   Pulse 73   Temp 97.6 F (36.4 C) (Oral)   Ht  5\' 4"  (1.626 m)   Wt 225 lb (102.1 kg)   LMP 05/20/2012   SpO2 97%   BMI 38.62 kg/m       Objective:   Physical Exam  General  Mental Status - Alert. General Appearance - Well groomed. Not in acute distress.  Skin Rashes- No Rashes.  HEENT Head- Normal. Ear Auditory Canal - Left- Normal. Right - Normal.Tympanic Membrane- Left- Normal. Right- Normal. Eye Sclera/Conjunctiva- Left- Normal. Right- Normal. Nose & Sinuses Nasal Mucosa- Left-  Boggy and Congested. Right-  Boggy and  Congested.Bilateral maxillary and frontal sinus pressure. Mouth & Throat Lips: Upper Lip- Normal: no dryness, cracking, pallor, cyanosis, or vesicular eruption. Lower Lip-Normal: no dryness, cracking, pallor, cyanosis or vesicular eruption. Buccal Mucosa- Bilateral- No Aphthous ulcers. Oropharynx- No Discharge or Erythema. Tonsils: Characteristics- Bilateral- No Erythema or Congestion. Size/Enlargement- Bilateral- No enlargement. Discharge- bilateral-None.  Neck Neck- Supple. No Masses.   Chest and Lung Exam Auscultation: Breath Sounds:-Clear even and unlabored.  Cardiovascular Auscultation:Rythm- Regular, rate and rhythm. Murmurs & Other Heart Sounds:Ausculatation of the heart reveal- No Murmurs.  Lymphatic Head & Neck General Head & Neck Lymphatics: Bilateral: Description- No Localized lymphadenopathy.  Neurologic Cranial Nerve exam:- CN III-XII intact(No  nystagmus), symmetric smile.  Strength:- 5/5 equal and symmetric strength both upper and lower extremities.       Assessment & Plan:  Your pain in lower abdomen appears to be directly over your bladder area. Your urine showed some bacteria on last check vs contaminated sample. I sent in keflex to your pharmacy please start pending repeat culture.  For your hip pain we gave toradol 60 mg im and ask you not take any diclofenac until Wednesday. Will refer you to prior orthopedist who evaluated you hip pain. [facility] called [facility] [address] and you have refill of tramadol  For your intermittent sinus region pain and ha I want you to go ahead and take the keflex as well. Will see if this clears you intermittent frontal sinus region pain.(and maxillary pressure/pain on exam.) If your pain is still persisting would consider referring you to neurologist since pattern described may be representative of atypical ha.  If any severe abdomen pain or severe ha after hours then ED evaluation.  Follow up in 2 weeks or as needed  Breylan Lefevers, Percell Miller, [employer]

## 2016-10-17 ENCOUNTER — Telehealth: Payer: Self-pay | Admitting: Medical

## 2016-10-17 DIAGNOSIS — M25552 Pain in left hip: Secondary | ICD-10-CM

## 2016-10-17 DIAGNOSIS — K080 Exfoliation of teeth due to systemic causes: Secondary | ICD-10-CM | POA: Diagnosis not present

## 2016-10-17 LAB — HM DIABETES EYE EXAM

## 2016-10-17 LAB — URINE CULTURE

## 2016-10-17 NOTE — Telephone Encounter (Signed)
referal to ortho made

## 2016-10-18 ENCOUNTER — Other Ambulatory Visit: Payer: Self-pay | Admitting: Family Medicine

## 2016-10-24 ENCOUNTER — Ambulatory Visit (INDEPENDENT_AMBULATORY_CARE_PROVIDER_SITE_OTHER): Payer: Federal, State, Local not specified - PPO | Admitting: Family Medicine

## 2016-10-24 ENCOUNTER — Encounter: Payer: Self-pay | Admitting: Family Medicine

## 2016-10-24 DIAGNOSIS — M67912 Unspecified disorder of synovium and tendon, left shoulder: Secondary | ICD-10-CM | POA: Diagnosis not present

## 2016-10-24 DIAGNOSIS — M25552 Pain in left hip: Secondary | ICD-10-CM | POA: Diagnosis not present

## 2016-10-24 NOTE — Patient Instructions (Signed)
We will set you up for a cortisone shot into the left hip. Wait about 5-7 days after this shot before you restart the hip exercises.  Continue with the shoulder motion exercises and the diclofenac. Follow up with me in 6 weeks for reevaluation.

## 2016-10-25 ENCOUNTER — Other Ambulatory Visit: Payer: Self-pay | Admitting: Family Medicine

## 2016-10-25 DIAGNOSIS — M25511 Pain in right shoulder: Secondary | ICD-10-CM

## 2016-10-25 NOTE — Progress Notes (Signed)
PCP: Ann Held, DO Referred by Dr. Larose Kells  Subjective:   HPI: Patient is a 55 y.o. female here for left shoulder injury.  8/18: Interpreter used throughout visit. Patient reports injury occurred on 7/29 at work. She drives a forklift at work. Felt a pop in left shoulder while doing so - mentioned some of equipment was broken and she did not realize this. Seemed to be mild but later that day when doing sign language she felt a lot of pain in the left shoulder. Pain currently 7/10. Tried aleve, advil, icing. Has not had radiographs. + night pain. Is right handed. Remotely  Had problems with this joint - had an injection and completely improved.  9/22: Interpreter present for visit. Patient reports pain still 6/10 at work, 5/10 at rest. Has not heard yet about physical therapy. No other changes.  11/7: Interpreter present. Unfortunately patient still has not heard about physical therapy. Pain level 5/10. No skin changes, fever, other complaints. Using pain pills, occasional ice. Doing home exercises.  12/19: Patient reports only slight improvement from last visit - 4/10 level pain, dull lateral left shoulder. Is taking voltaren but needs a refill - this helps. Avoiding overhead activities when possible because they bother her. Doing home exercises. Has not heard from workers comp regarding physical therapy. No skin changes, other complaints.  12/06/15: Patient feels mildly improved though still has not had physical therapy approved nor an MRI. Pain level 2/10 at rest, dull lateral left shoulder. Worse with overhead motions. No skin changes, other complaints. Doing home exercises.  3/30: Patient reports she has mainly been resting her shoulder now. No pain currently - gets some soreness with full motions. Pain level 0/10. No skin changes, fever, other complaints. No radiation.  5/15: Patient reports she feels much better. Still notices pain especially  with weather change, a lot of movement. Pain currently 0/10. Taking diclofenac as needed. No skin changes, fever, other complaints.  9/5: Patient reports she's doing well. No pain currently in left shoulder. Not doing home exercises because she's resting from recent gall bladder surgery. No skin changes, numbness.  11/7: Patient returns with 4 days of worsening left shoulder pain. She had been doing extremely well then reports she 'slept wrong' 4 days ago. Since then has had pain lateral left shoulder, upper arm. Has been icing, taking voltaren and tramadol Worse trying to reach overhead and forward, lying on side. Pain 5/10, sharp. No skin changes, numbness.  12/19: Patient reports she has a little pain but has improved since last visit. Pain is 2-3/10, sharp. Worse with overhead motions. Doing home exercises. Taking diclofenac some. No skin changes, numbness. Pain has left hip pain worsening past month, sharp. Feels anteriorly in groin.  Past Medical History:  Diagnosis Date  . Asthma   . Complication of anesthesia    one time woke up and was vey scared,17 yrs ago  . Deaf   . Diabetes mellitus without complication ([place])   . GERD (gastroesophageal reflux disease)   . Hypertension   . Left groin pain   . Thyroid disease     Current Outpatient Prescriptions on File Prior to Visit  Medication Sig Dispense Refill  . albuterol (PROVENTIL HFA;VENTOLIN HFA) 108 (90 Base) MCG/ACT inhaler Inhale 2 puffs into the lungs every 6 (six) hours as needed for wheezing. 1 Inhaler 3  . amLODipine (NORVASC) 5 MG tablet TAKE 1 TABLET (5 MG TOTAL) BY MOUTH DAILY. 90 tablet 1  . azelastine (ASTELIN)  0.1 % nasal spray Place 2 sprays into both nostrils 2 (two) times daily. Use in each nostril as directed 30 mL 3  . cephALEXin (KEFLEX) 500 MG capsule Take 1 capsule (500 mg total) by mouth 2 (two) times daily. 14 capsule 0  . cetirizine (ZYRTEC) 10 MG tablet Take 1 tablet (10 mg total) by  mouth daily. 30 tablet 11  . diclofenac (VOLTAREN) 75 MG EC tablet TAKE 1 TABLET (75 MG TOTAL) BY MOUTH 2 (TWO) TIMES DAILY. 30 tablet 1  . Fiber CHEW Chew 1 tablet by mouth daily.    . fluticasone (FLONASE) 50 MCG/ACT nasal spray PLACE 2 SPRAYS INTO BOTH NOSTRILS DAILY. 16 g 4  . glucose blood test strip Use as instructed--- one touch verio test strips 100 each 12  . hydrochlorothiazide (HYDRODIURIL) 12.5 MG tablet Take 1 tablet (12.5 mg total) by mouth daily. 90 tablet 1  . ipratropium-albuterol (DUONEB) 0.5-2.5 (3) MG/3ML SOLN Take 3 mLs by nebulization every 6 (six) hours as needed. 360 mL 3  . KLOR-CON M20 20 MEQ tablet TAKE 1 TABLET (20 MEQ TOTAL) BY MOUTH DAILY. 90 tablet 1  . meclizine (ANTIVERT) 25 MG tablet Take 1 tablet (25 mg total) by mouth 3 (three) times daily as needed. 45 tablet 3  . metFORMIN (GLUCOPHAGE-XR) 500 MG 24 hr tablet TAKE 1 TABLET (500 MG TOTAL) BY MOUTH DAILY WITH BREAKFAST. 90 tablet 1  . methocarbamol (ROBAXIN) 500 MG tablet Take 1 tablet (500 mg total) by mouth 2 (two) times daily. 20 tablet 0  . mometasone-formoterol (DULERA) 100-5 MCG/ACT AERO Inhale 2 puffs into the lungs 2 (two) times daily. 1 Inhaler 5  . Multiple Vitamin (MULTIVITAMIN) tablet Take 1 tablet by mouth daily.    . NONFORMULARY OR COMPOUNDED ITEM Nebulizer   DX ASTHMA 1 each 0  . omeprazole (PRILOSEC) 20 MG capsule Take 1 capsule (20 mg total) by mouth daily. 30 capsule 2  . ranitidine (ZANTAC) 150 MG capsule Take 1 capsule (150 mg total) by mouth 2 (two) times daily. 60 capsule 0  . Spacer/Aero-Holding Chambers (AEROCHAMBER MV) inhaler Use as instructed 1 each 0  . traMADol (ULTRAM) 50 MG tablet Take 1 tablet (50 mg total) by mouth every 8 (eight) hours as needed. 30 tablet 1  . zolpidem (AMBIEN) 10 MG tablet Take 1 tablet (10 mg total) by mouth at bedtime as needed for sleep. 15 tablet 1   No current facility-administered medications on file prior to visit.     Past Surgical History:   Procedure Laterality Date  . CARDIAC CATHETERIZATION    . CESAREAN SECTION    . CHOLECYSTECTOMY N/A 06/20/2016   Procedure: LAPAROSCOPIC CHOLECYSTECTOMY;  Surgeon: Rolm Bookbinder, MD;  Location: [facility];  Service: General;  Laterality: N/A;  . ESOPHAGEAL MANOMETRY N/A 09/29/2013   Procedure: ESOPHAGEAL MANOMETRY (EM);  Surgeon: Milus Banister, MD;  Location: [facility];  Service: Endoscopy;  Laterality: N/A;    Allergies  Allergen Reactions  . Losartan Shortness Of Breath  . Oxycodone-Acetaminophen Nausea And Vomiting  . Augmentin [Amoxicillin-Pot Clavulanate] Diarrhea    Social History   Social History  . Marital status: Married    Spouse name: N/A  . Number of children: N/A  . Years of education: N/A   Occupational History  . diabled    Social History Main Topics  . Smoking status: Never Smoker  . Smokeless tobacco: Never Used  . Alcohol use No  . Drug use: No  . Sexual activity: No  Other Topics Concern  . Not on file   Social History Narrative  . No narrative on file    Family History  Problem Relation Age of Onset  . Diabetes Mother   . Heart disease Mother   . Diabetes Father     BP 131/76   Pulse 75   Ht 5\' 4"  (1.626 m)   Wt 255 lb (115.7 kg)   LMP 05/20/2012   BMI 43.77 kg/m   Review of Systems: See HPI above.    Objective:  Physical Exam:  Gen: NAD  Left shoulder: No swelling, ecchymoses.  No gross deformity. No TTP. Only 60 degrees ER, 100 abduction, 120 flexion - all painful. Positive Hawkins, Neers. Negative Yergasons. Strength 4/5 with empty can and 5/5 resisted internal/external rotation. Negative apprehension. NV intact distally.  Right shoulder: FROM without pain.    Assessment & Plan:  1. Left shoulder injury - current issue due to rotator cuff impingement and developing adhesive capsulitis.  Reports less pain but motion still limited.  Voltaren as needed, heat, continue codman exercises.  Consider repeat  intraarticular injection.  F/u in 6 weeks.  2. Left hip pain - known DJD - improved well following intraarticular injection earlier this year - will repeat this.  Restart hip home exercises after this.

## 2016-10-25 NOTE — Assessment & Plan Note (Signed)
known DJD - improved well following intraarticular injection earlier this year - will repeat this.  Restart hip home exercises after this.

## 2016-10-25 NOTE — Assessment & Plan Note (Signed)
current issue due to rotator cuff impingement and developing adhesive capsulitis.  Reports less pain but motion still limited.  Voltaren as needed, heat, continue codman exercises.  Consider repeat intraarticular injection.  F/u in 6 weeks.

## 2016-11-01 ENCOUNTER — Ambulatory Visit: Payer: Federal, State, Local not specified - PPO | Admitting: Medical

## 2016-11-01 ENCOUNTER — Other Ambulatory Visit: Payer: Self-pay | Admitting: Family Medicine

## 2016-11-01 ENCOUNTER — Telehealth: Payer: Self-pay | Admitting: Family Medicine

## 2016-11-01 NOTE — Telephone Encounter (Signed)
Patient lvm cancelling her 3:45 due to transportation concerns and will call back to [facility], charge or no charge

## 2016-11-01 NOTE — Telephone Encounter (Signed)
No charge. 

## 2016-11-02 ENCOUNTER — Other Ambulatory Visit: Payer: Self-pay | Admitting: Family Medicine

## 2016-11-02 DIAGNOSIS — M25552 Pain in left hip: Secondary | ICD-10-CM

## 2016-11-07 ENCOUNTER — Ambulatory Visit (INDEPENDENT_AMBULATORY_CARE_PROVIDER_SITE_OTHER): Payer: Federal, State, Local not specified - PPO | Admitting: Medical

## 2016-11-07 ENCOUNTER — Encounter: Payer: Self-pay | Admitting: Medical

## 2016-11-07 VITALS — BP 138/76 | HR 79 | Temp 97.8°F | Resp 16 | Ht 64.0 in | Wt 228.2 lb

## 2016-11-07 DIAGNOSIS — R51 Headache: Secondary | ICD-10-CM

## 2016-11-07 DIAGNOSIS — J3489 Other specified disorders of nose and nasal sinuses: Secondary | ICD-10-CM

## 2016-11-07 DIAGNOSIS — R102 Pelvic and perineal pain: Secondary | ICD-10-CM

## 2016-11-07 DIAGNOSIS — R519 Headache, unspecified: Secondary | ICD-10-CM

## 2016-11-07 DIAGNOSIS — M25552 Pain in left hip: Secondary | ICD-10-CM | POA: Diagnosis not present

## 2016-11-07 DIAGNOSIS — M25512 Pain in left shoulder: Secondary | ICD-10-CM | POA: Diagnosis not present

## 2016-11-07 MED ORDER — CEPHALEXIN 500 MG PO CAPS: 500.0000 mg | ORAL_CAPSULE | Freq: Two times a day (BID) | ORAL | 0 refills | Status: DC

## 2016-11-07 MED ORDER — TRAMADOL HCL 50 MG PO TABS: ORAL_TABLET | ORAL | 0 refills | Status: DC

## 2016-11-07 NOTE — Progress Notes (Signed)
Subjective:    Patient ID: Rhonda Morrow, female    DOB: 12/27/1960, 56 y.o.   MRN: [id number]  HPI  Pt states she was never given antibiotic. Pt had some suprapubic area pain. Pt did have some bacteria on urine culture mixed organism. I rx'd antibiotic but pharmacy did not give her the rx.   Pt states sports med suggestion suggested for her hip that she could get an injection. [facility] is setting her up for injection of her hip. Also they might give shoulder in her lt shoulder. They are working on er hp pain first/  Pt is taking the tramadol and it is helping a lot. Particularly at night it helps. She also used diclofenac for pain.  Pt still having ha. Some frontal. She states changes position ha lying supine pain will decrease.Laying flat on he back on he back has no ha. When she gets up from reclined position will get ha for about 10 minutes. Sometimes dizzy.    Last visit she had described Pt still has some sinus pressure area pain with warm sensation. Also last visit described  headache or pain for about 20 minutes frontal region. Occurs about 3 times a week. Sometimes feels dizzy. Pt ct of head did not show acute cause. No gross motor or sensory function deficits.      Review of Systems  Constitutional: Negative for chills, fatigue and fever.  HENT: Positive for sinus pain. Negative for congestion, ear pain, rhinorrhea, sinus pressure and sneezing.   Respiratory: Negative for cough, chest tightness, shortness of breath and wheezing.   Cardiovascular: Negative for chest pain and palpitations.  Gastrointestinal: Negative for abdominal distention, blood in stool, constipation, diarrhea, nausea and vomiting.       Suprapubic pressure.  Genitourinary: Negative for dysuria.  Musculoskeletal:       Lt shoulder and lt hip pain.  Skin: Negative for rash.  Neurological: Positive for headaches. Negative for dizziness, seizures, syncope and weakness.  Hematological: Negative for  adenopathy.  Psychiatric/Behavioral: Negative for behavioral problems and confusion.   Past Medical History:  Diagnosis Date  . Asthma   . Complication of anesthesia    one time woke up and was vey scared,17 yrs ago  . Deaf   . Diabetes mellitus without complication ([place])   . GERD (gastroesophageal reflux disease)   . Hypertension   . Left groin pain   . Thyroid disease      Social History   Social History  . Marital status: Married    Spouse name: N/A  . Number of children: N/A  . Years of education: N/A   Occupational History  . diabled    Social History Main Topics  . Smoking status: Never Smoker  . Smokeless tobacco: Never Used  . Alcohol use No  . Drug use: No  . Sexual activity: No   Other Topics Concern  . Not on file   Social History Narrative  . No narrative on file    Past Surgical History:  Procedure Laterality Date  . CARDIAC CATHETERIZATION    . CESAREAN SECTION    . CHOLECYSTECTOMY N/A 06/20/2016   Procedure: LAPAROSCOPIC CHOLECYSTECTOMY;  Surgeon: Rolm Bookbinder, MD;  Location: [facility];  Service: General;  Laterality: N/A;  . ESOPHAGEAL MANOMETRY N/A 09/29/2013   Procedure: ESOPHAGEAL MANOMETRY (EM);  Surgeon: Milus Banister, MD;  Location: [facility];  Service: Endoscopy;  Laterality: N/A;    Family History  Problem Relation Age of Onset  .  Diabetes Mother   . Heart disease Mother   . Diabetes Father     Allergies  Allergen Reactions  . Losartan Shortness Of Breath  . Oxycodone-Acetaminophen Nausea And Vomiting  . Augmentin [Amoxicillin-Pot Clavulanate] Diarrhea    Current Outpatient Prescriptions on File Prior to Visit  Medication Sig Dispense Refill  . albuterol (PROVENTIL HFA;VENTOLIN HFA) 108 (90 Base) MCG/ACT inhaler Inhale 2 puffs into the lungs every 6 (six) hours as needed for wheezing. 1 Inhaler 3  . amLODipine (NORVASC) 5 MG tablet TAKE 1 TABLET (5 MG TOTAL) BY MOUTH DAILY. 90 tablet 1  . azelastine (ASTELIN) 0.1 %  nasal spray Place 2 sprays into both nostrils 2 (two) times daily. Use in each nostril as directed 30 mL 3  . cetirizine (ZYRTEC) 10 MG tablet Take 1 tablet (10 mg total) by mouth daily. 30 tablet 11  . diclofenac (VOLTAREN) 75 MG EC tablet TAKE 1 TABLET (75 MG TOTAL) BY MOUTH 2 (TWO) TIMES DAILY. 60 tablet 1  . Fiber CHEW Chew 1 tablet by mouth daily.    . fluticasone (FLONASE) 50 MCG/ACT nasal spray PLACE 2 SPRAYS INTO BOTH NOSTRILS DAILY. 16 g 4  . glucose blood test strip Use as instructed--- one touch verio test strips 100 each 12  . hydrochlorothiazide (HYDRODIURIL) 12.5 MG tablet Take 1 tablet (12.5 mg total) by mouth daily. 90 tablet 1  . ipratropium-albuterol (DUONEB) 0.5-2.5 (3) MG/3ML SOLN Take 3 mLs by nebulization every 6 (six) hours as needed. 360 mL 3  . KLOR-CON M20 20 MEQ tablet TAKE 1 TABLET (20 MEQ TOTAL) BY MOUTH DAILY. 90 tablet 1  . meclizine (ANTIVERT) 25 MG tablet Take 1 tablet (25 mg total) by mouth 3 (three) times daily as needed. 45 tablet 3  . metFORMIN (GLUCOPHAGE-XR) 500 MG 24 hr tablet TAKE 1 TABLET (500 MG TOTAL) BY MOUTH DAILY WITH BREAKFAST. 90 tablet 1  . methocarbamol (ROBAXIN) 500 MG tablet Take 1 tablet (500 mg total) by mouth 2 (two) times daily. 20 tablet 0  . mometasone-formoterol (DULERA) 100-5 MCG/ACT AERO Inhale 2 puffs into the lungs 2 (two) times daily. 1 Inhaler 5  . Multiple Vitamin (MULTIVITAMIN) tablet Take 1 tablet by mouth daily.    . NONFORMULARY OR COMPOUNDED ITEM Nebulizer   DX ASTHMA 1 each 0  . omeprazole (PRILOSEC) 20 MG capsule Take 1 capsule (20 mg total) by mouth daily. 30 capsule 2  . ranitidine (ZANTAC) 150 MG capsule Take 1 capsule (150 mg total) by mouth 2 (two) times daily. 60 capsule 0  . Spacer/Aero-Holding Chambers (AEROCHAMBER MV) inhaler Use as instructed 1 each 0  . zolpidem (AMBIEN) 10 MG tablet Take 1 tablet (10 mg total) by mouth at bedtime as needed for sleep. 15 tablet 1  . cephALEXin (KEFLEX) 500 MG capsule Take 1  capsule (500 mg total) by mouth 2 (two) times daily. (Patient not taking: Reported on 11/07/2016) 14 capsule 0  . traMADol (ULTRAM) 50 MG tablet Take 1 tablet (50 mg total) by mouth every 8 (eight) hours as needed. (Patient not taking: Reported on 11/07/2016) 30 tablet 1   No current facility-administered medications on file prior to visit.     BP 138/76 (BP Location: Right Arm, Patient Position: Sitting, Cuff Size: Large)   Pulse 79   Temp 97.8 F (36.6 C) (Oral)   Resp 16   Ht 5\' 4"  (1.626 m)   Wt 228 lb 4 oz (103.5 kg)   LMP 05/20/2012   SpO2 97%  BMI 39.18 kg/m       Objective:   Physical Exam  General Mental Status- Alert. General Appearance- Not in acute distress.   Skin General: Color- Normal Color. Moisture- Normal Moisture.  Neck Carotid Arteries- Normal color. Moisture- Normal Moisture. No carotid bruits. No JVD.  Chest and Lung Exam Auscultation: Breath Sounds:-Normal.  Cardiovascular Auscultation:Rythm- Regular. Murmurs & Other Heart Sounds:Auscultation of the heart reveals- No Murmurs.  Abdomen Inspection:-Inspeection Normal. Palpation/Percussion:Note:No mass. Palpation and Percussion of the abdomen reveal- faint suprapubic Tender, Non Distended + BS, no rebound or guarding.    Neurologic Cranial Nerve exam:- CN III-XII intact(No nystagmus), symmetric smile. Strength:- 5/5 equal and symmetric strength both upper and lower extremities.  Lt shoulder- anterior aspect tenderness to palpation. Lt hip- pain on rom. Pain on palpation as well.       Assessment & Plan:  For your suprapubic area pain and recent bacteria in urine will rx keflex antibiotic.    Will also see if  keflex helps relieve your occasional  frontal sinus pressure as well.  For your hip and shoulder pain continue sports med recommendation(see if injections help). Also continue diclofenac and occasional tramadol. Will get you to sign controlled med contact since you have needed pain  medicine for a while intermittently.  For your atypical ha will refer you to neurologist.  Counseled on getting balance before ambulating.  Follow up in 3-4 weeks or as needed  Pt last tramadol rx of 10-18-2016. For 30 tabs sig was 1 every 8 hours as needed for pain.   I will send in another rx of tramadol. Will get staff to remind pt that from date of fill she needs to make it last one full month or more.

## 2016-11-07 NOTE — Patient Instructions (Addendum)
For your suprapubic area pain and recent bacteria in urine will rx keflex antibiotic.    Will also see if  keflex helps relieve your occasional  frontal sinus pressure as well.  For your hip and shoulder pain continue sports med recommendation.(see if injections help). Also  continue diclofenac and occasional tramadol. Will get you to sign controlled med contact since you have needed pain medicine for a while intermittently.   For your atypical ha will refer you to neurologist.  Counseled on getting balance before ambulating.  Follow up in 3-4 weeks or as needed

## 2016-11-07 NOTE — Progress Notes (Signed)
Pre visit review using our clinic review tool, if applicable. No additional management support is needed unless otherwise documented below in the visit note/SLS  

## 2016-11-13 ENCOUNTER — Ambulatory Visit
Admission: RE | Admit: 2016-11-13 | Discharge: 2016-11-13 | Disposition: A | Discharge status: Home / Self Care | Payer: Federal, State, Local not specified - PPO | Source: Ambulatory Visit | Attending: Family Medicine | Admitting: Family Medicine

## 2016-11-13 DIAGNOSIS — M25552 Pain in left hip: Secondary | ICD-10-CM | POA: Diagnosis not present

## 2016-11-13 MED ORDER — METHYLPREDNISOLONE ACETATE 40 MG/ML INJ SUSP (RADIOLOG
120.0000 mg | Freq: Once | INTRAMUSCULAR | Status: AC
  Administered 2016-11-13: 120 mg via INTRA_ARTICULAR

## 2016-11-13 MED ORDER — IOPAMIDOL (ISOVUE-M 200) INJECTION 41%
1.0000 mL | Freq: Once | INTRAMUSCULAR | Status: AC
  Administered 2016-11-13: 1 mL via INTRA_ARTICULAR

## 2016-11-16 ENCOUNTER — Encounter: Payer: Self-pay | Admitting: Medical

## 2016-11-16 ENCOUNTER — Ambulatory Visit (INDEPENDENT_AMBULATORY_CARE_PROVIDER_SITE_OTHER): Payer: Federal, State, Local not specified - PPO | Admitting: Medical

## 2016-11-16 DIAGNOSIS — Z5329 Procedure and treatment not carried out because of patient's decision for other reasons: Secondary | ICD-10-CM

## 2016-11-16 NOTE — Progress Notes (Deleted)
Pre visit review using our clinic review tool, if applicable. No additional management support is needed unless otherwise documented below in the visit note. 

## 2016-11-17 ENCOUNTER — Emergency Department (HOSPITAL_BASED_OUTPATIENT_CLINIC_OR_DEPARTMENT_OTHER): Payer: Federal, State, Local not specified - PPO

## 2016-11-17 ENCOUNTER — Encounter: Payer: Self-pay | Admitting: Medical

## 2016-11-17 ENCOUNTER — Encounter (HOSPITAL_BASED_OUTPATIENT_CLINIC_OR_DEPARTMENT_OTHER): Payer: Self-pay | Admitting: Emergency Medicine

## 2016-11-17 ENCOUNTER — Ambulatory Visit (INDEPENDENT_AMBULATORY_CARE_PROVIDER_SITE_OTHER): Payer: Federal, State, Local not specified - PPO | Admitting: Medical

## 2016-11-17 ENCOUNTER — Emergency Department (HOSPITAL_BASED_OUTPATIENT_CLINIC_OR_DEPARTMENT_OTHER)
Admission: EM | Admit: 2016-11-17 | Discharge: 2016-11-17 | Disposition: A | Discharge status: Home / Self Care | Payer: Federal, State, Local not specified - PPO | Attending: Emergency Medicine | Admitting: Emergency Medicine

## 2016-11-17 VITALS — BP 120/76 | HR 93 | Temp 98.0°F | Resp 16 | Ht 64.0 in | Wt 223.4 lb

## 2016-11-17 DIAGNOSIS — R05 Cough: Secondary | ICD-10-CM

## 2016-11-17 DIAGNOSIS — R1110 Vomiting, unspecified: Secondary | ICD-10-CM | POA: Insufficient documentation

## 2016-11-17 DIAGNOSIS — R110 Nausea: Secondary | ICD-10-CM

## 2016-11-17 DIAGNOSIS — R0789 Other chest pain: Secondary | ICD-10-CM | POA: Insufficient documentation

## 2016-11-17 DIAGNOSIS — R109 Unspecified abdominal pain: Secondary | ICD-10-CM

## 2016-11-17 DIAGNOSIS — J45909 Unspecified asthma, uncomplicated: Secondary | ICD-10-CM | POA: Insufficient documentation

## 2016-11-17 DIAGNOSIS — E119 Type 2 diabetes mellitus without complications: Secondary | ICD-10-CM | POA: Diagnosis not present

## 2016-11-17 DIAGNOSIS — R1084 Generalized abdominal pain: Secondary | ICD-10-CM

## 2016-11-17 DIAGNOSIS — R0602 Shortness of breath: Secondary | ICD-10-CM | POA: Insufficient documentation

## 2016-11-17 DIAGNOSIS — Z20828 Contact with and (suspected) exposure to other viral communicable diseases: Secondary | ICD-10-CM

## 2016-11-17 DIAGNOSIS — Z7984 Long term (current) use of oral hypoglycemic drugs: Secondary | ICD-10-CM | POA: Diagnosis not present

## 2016-11-17 DIAGNOSIS — I10 Essential (primary) hypertension: Secondary | ICD-10-CM | POA: Insufficient documentation

## 2016-11-17 DIAGNOSIS — R42 Dizziness and giddiness: Secondary | ICD-10-CM | POA: Insufficient documentation

## 2016-11-17 DIAGNOSIS — J209 Acute bronchitis, unspecified: Secondary | ICD-10-CM | POA: Diagnosis not present

## 2016-11-17 DIAGNOSIS — R059 Cough, unspecified: Secondary | ICD-10-CM

## 2016-11-17 LAB — URINALYSIS, MICROSCOPIC (REFLEX): RBC / HPF: NONE SEEN RBC/hpf (ref 0–5)

## 2016-11-17 LAB — COMPREHENSIVE METABOLIC PANEL
ALT: 46 U/L (ref 14–54)
ANION GAP: 11 (ref 5–15)
AST: 38 U/L (ref 15–41)
Albumin: 3.7 g/dL (ref 3.5–5.0)
Alkaline Phosphatase: 94 U/L (ref 38–126)
BUN: 20 mg/dL (ref 6–20)
CALCIUM: 9.6 mg/dL (ref 8.9–10.3)
CO2: 28 mmol/L (ref 22–32)
Chloride: 98 mmol/L — ABNORMAL LOW (ref 101–111)
Creatinine, Ser: 1.08 mg/dL — ABNORMAL HIGH (ref 0.44–1.00)
GFR calc non Af Amer: 58 mL/min — ABNORMAL LOW (ref >60)
Glucose, Bld: 178 mg/dL — ABNORMAL HIGH (ref 65–99)
Potassium: 3.3 mmol/L — ABNORMAL LOW (ref 3.5–5.1)
SODIUM: 137 mmol/L (ref 135–145)
Total Bilirubin: 0.2 mg/dL — ABNORMAL LOW (ref 0.3–1.2)
Total Protein: 7.8 g/dL (ref 6.5–8.1)

## 2016-11-17 LAB — URINALYSIS, ROUTINE W REFLEX MICROSCOPIC
BILIRUBIN URINE: NEGATIVE
Glucose, UA: NEGATIVE mg/dL
HGB URINE DIPSTICK: NEGATIVE
Ketones, ur: 15 mg/dL — AB
Nitrite: NEGATIVE
PROTEIN: NEGATIVE mg/dL
Specific Gravity, Urine: 1.027 (ref 1.005–1.030)
pH: 7 (ref 5.0–8.0)

## 2016-11-17 LAB — TROPONIN I
Troponin I: <0.03 ng/mL (ref <0.03)
Troponin I: <0.03 ng/mL (ref <0.03)

## 2016-11-17 LAB — CBC
HEMATOCRIT: 45.8 % (ref 36.0–46.0)
Hemoglobin: 14.8 g/dL (ref 12.0–15.0)
MCH: 27.5 pg (ref 26.0–34.0)
MCHC: 32.3 g/dL (ref 30.0–36.0)
MCV: 85 fL (ref 78.0–100.0)
Platelets: 253 10*3/uL (ref 150–400)
RBC: 5.39 MIL/uL — ABNORMAL HIGH (ref 3.87–5.11)
RDW: 14.3 % (ref 11.5–15.5)
WBC: 10.8 10*3/uL — ABNORMAL HIGH (ref 4.0–10.5)

## 2016-11-17 LAB — LIPASE, BLOOD: Lipase: 18 U/L (ref 11–51)

## 2016-11-17 LAB — CBG MONITORING, ED: GLUCOSE-CAPILLARY: 175 mg/dL — AB (ref 65–99)

## 2016-11-17 MED ORDER — MOMETASONE FURO-FORMOTEROL FUM 100-5 MCG/ACT IN AERO: 2.0000 | INHALATION_SPRAY | Freq: Two times a day (BID) | RESPIRATORY_TRACT | 5 refills | Status: DC

## 2016-11-17 MED ORDER — ASPIRIN 81 MG PO CHEW
324.0000 mg | CHEWABLE_TABLET | Freq: Once | ORAL | Status: AC
  Administered 2016-11-17: 324 mg via ORAL
  Filled 2016-11-17: qty 4

## 2016-11-17 MED ORDER — BENZONATATE 100 MG PO CAPS: 100.0000 mg | ORAL_CAPSULE | Freq: Three times a day (TID) | ORAL | 0 refills | Status: DC | PRN

## 2016-11-17 MED ORDER — ACETAMINOPHEN 500 MG PO TABS
1000.0000 mg | ORAL_TABLET | Freq: Once | ORAL | Status: AC
  Administered 2016-11-17: 1000 mg via ORAL
  Filled 2016-11-17: qty 2

## 2016-11-17 MED ORDER — ALBUTEROL SULFATE HFA 108 (90 BASE) MCG/ACT IN AERS: 2.0000 | INHALATION_SPRAY | Freq: Four times a day (QID) | RESPIRATORY_TRACT | 3 refills | Status: DC | PRN

## 2016-11-17 MED ORDER — DICYCLOMINE HCL 20 MG PO TABS: 20.0000 mg | ORAL_TABLET | Freq: Two times a day (BID) | ORAL | 0 refills | Status: DC

## 2016-11-17 MED ORDER — OSELTAMIVIR PHOSPHATE 75 MG PO CAPS: 75.0000 mg | ORAL_CAPSULE | Freq: Two times a day (BID) | ORAL | 0 refills | Status: DC

## 2016-11-17 MED ORDER — ONDANSETRON HCL 4 MG/2ML IJ SOLN
4.0000 mg | Freq: Once | INTRAMUSCULAR | Status: AC
  Administered 2016-11-17: 4 mg via INTRAVENOUS
  Filled 2016-11-17: qty 2

## 2016-11-17 MED ORDER — DICYCLOMINE HCL 10 MG PO CAPS
20.0000 mg | ORAL_CAPSULE | Freq: Once | ORAL | Status: AC
  Administered 2016-11-17: 20 mg via ORAL
  Filled 2016-11-17: qty 2

## 2016-11-17 MED ORDER — ONDANSETRON HCL 4 MG PO TABS: 4.0000 mg | ORAL_TABLET | Freq: Three times a day (TID) | ORAL | 0 refills | Status: DC | PRN

## 2016-11-17 MED ORDER — LEVOFLOXACIN 500 MG PO TABS: 500.0000 mg | ORAL_TABLET | Freq: Every day | ORAL | 0 refills | Status: DC

## 2016-11-17 NOTE — Progress Notes (Signed)
Subjective:    Patient ID: Rhonda Morrow, female    DOB: 1960/12/30, 56 y.o.   MRN: [id number]  HPI  Pt in with head/nasal congestion, sweats, cough with yellow- green mucous, fatigue, pnd and productive cough. Pt has some mild bodyaches. He husband has flu like illlness.(he is ill enough that needs to be seen in ED today)  Feels chest congested and bringing up mucous.   Pt has no obvious wheezing.  Some urine frequency. No obvious uti symptoms. A month ago when on kefxex she was better.  Pt blood sugar was 150.   Review of Systems  HENT: Positive for congestion, postnasal drip, sinus pain and sinus pressure.   Respiratory: Positive for cough.   Cardiovascular: Negative for chest pain and palpitations.  Gastrointestinal: Negative for abdominal pain.  Genitourinary: Positive for frequency. Negative for hematuria and urgency.  Musculoskeletal: Positive for myalgias. Negative for back pain.       Mild   Skin: Negative for rash.  Neurological: Negative for dizziness and headaches.  Hematological: Negative for adenopathy. Does not bruise/bleed easily.  Psychiatric/Behavioral: Negative for behavioral problems and confusion.    Past Medical History:  Diagnosis Date  . Asthma   . Complication of anesthesia    one time woke up and was vey scared,17 yrs ago  . Deaf   . Diabetes mellitus without complication ([place])   . GERD (gastroesophageal reflux disease)   . Hypertension   . Left groin pain   . Thyroid disease      Social History   Social History  . Marital status: Married    Spouse name: N/A  . Number of children: N/A  . Years of education: N/A   Occupational History  . diabled    Social History Main Topics  . Smoking status: Never Smoker  . Smokeless tobacco: Never Used  . Alcohol use No  . Drug use: No  . Sexual activity: No   Other Topics Concern  . Not on file   Social History Narrative  . No narrative on file    Past Surgical History:  Procedure  Laterality Date  . CARDIAC CATHETERIZATION    . CESAREAN SECTION    . CHOLECYSTECTOMY N/A 06/20/2016   Procedure: LAPAROSCOPIC CHOLECYSTECTOMY;  Surgeon: Rolm Bookbinder, MD;  Location: [facility];  Service: General;  Laterality: N/A;  . ESOPHAGEAL MANOMETRY N/A 09/29/2013   Procedure: ESOPHAGEAL MANOMETRY (EM);  Surgeon: Milus Banister, MD;  Location: [facility];  Service: Endoscopy;  Laterality: N/A;    Family History  Problem Relation Age of Onset  . Diabetes Mother   . Heart disease Mother   . Diabetes Father     Allergies  Allergen Reactions  . Losartan Shortness Of Breath  . Oxycodone-Acetaminophen Nausea And Vomiting  . Augmentin [Amoxicillin-Pot Clavulanate] Diarrhea    Current Outpatient Prescriptions on File Prior to Visit  Medication Sig Dispense Refill  . albuterol (PROVENTIL HFA;VENTOLIN HFA) 108 (90 Base) MCG/ACT inhaler Inhale 2 puffs into the lungs every 6 (six) hours as needed for wheezing. 1 Inhaler 3  . amLODipine (NORVASC) 5 MG tablet TAKE 1 TABLET (5 MG TOTAL) BY MOUTH DAILY. 90 tablet 1  . azelastine (ASTELIN) 0.1 % nasal spray Place 2 sprays into both nostrils 2 (two) times daily. Use in each nostril as directed 30 mL 3  . cetirizine (ZYRTEC) 10 MG tablet Take 1 tablet (10 mg total) by mouth daily. 30 tablet 11  . diclofenac (VOLTAREN) 75 MG EC tablet TAKE  1 TABLET (75 MG TOTAL) BY MOUTH 2 (TWO) TIMES DAILY. 60 tablet 1  . Fiber CHEW Chew 1 tablet by mouth daily.    . fluticasone (FLONASE) 50 MCG/ACT nasal spray PLACE 2 SPRAYS INTO BOTH NOSTRILS DAILY. 16 g 4  . glucose blood test strip Use as instructed--- one touch verio test strips 100 each 12  . hydrochlorothiazide (HYDRODIURIL) 12.5 MG tablet Take 1 tablet (12.5 mg total) by mouth daily. 90 tablet 1  . ipratropium-albuterol (DUONEB) 0.5-2.5 (3) MG/3ML SOLN Take 3 mLs by nebulization every 6 (six) hours as needed. 360 mL 3  . KLOR-CON M20 20 MEQ tablet TAKE 1 TABLET (20 MEQ TOTAL) BY MOUTH DAILY. 90 tablet  1  . meclizine (ANTIVERT) 25 MG tablet Take 1 tablet (25 mg total) by mouth 3 (three) times daily as needed. 45 tablet 3  . metFORMIN (GLUCOPHAGE-XR) 500 MG 24 hr tablet TAKE 1 TABLET (500 MG TOTAL) BY MOUTH DAILY WITH BREAKFAST. 90 tablet 1  . methocarbamol (ROBAXIN) 500 MG tablet Take 1 tablet (500 mg total) by mouth 2 (two) times daily. 20 tablet 0  . mometasone-formoterol (DULERA) 100-5 MCG/ACT AERO Inhale 2 puffs into the lungs 2 (two) times daily. 1 Inhaler 5  . Multiple Vitamin (MULTIVITAMIN) tablet Take 1 tablet by mouth daily.    . NONFORMULARY OR COMPOUNDED ITEM Nebulizer   DX ASTHMA 1 each 0  . omeprazole (PRILOSEC) 20 MG capsule Take 1 capsule (20 mg total) by mouth daily. 30 capsule 2  . ranitidine (ZANTAC) 150 MG capsule Take 1 capsule (150 mg total) by mouth 2 (two) times daily. 60 capsule 0  . Spacer/Aero-Holding Chambers (AEROCHAMBER MV) inhaler Use as instructed 1 each 0  . traMADol (ULTRAM) 50 MG tablet 1 tab at night as needed  moderate to severe pain 30 tablet 0  . zolpidem (AMBIEN) 10 MG tablet Take 1 tablet (10 mg total) by mouth at bedtime as needed for sleep. 15 tablet 1   No current facility-administered medications on file prior to visit.     BP 120/76 (BP Location: Left Arm, Patient Position: Sitting, Cuff Size: Large)   Pulse 93   Temp 98 F (36.7 C) (Oral)   Resp 16   Ht 5\' 4"  (1.626 m)   Wt 223 lb 6 oz (101.3 kg)   LMP 05/20/2012   SpO2 98%   BMI 38.34 kg/m       Objective:   Physical Exam  General  Mental Status - Alert. General Appearance - Well groomed. Not in acute distress.  Skin Rashes- No Rashes.  HEENT Head- Normal. Ear Auditory Canal - Left- Normal. Right - Normal.Tympanic Membrane- Left- Normal. Right- Normal. Eye Sclera/Conjunctiva- Left- Normal. Right- Normal. Nose & Sinuses Nasal Mucosa- Left-  Boggy and Congested. Right-  Boggy and  Congested.Bilateral maxillary and frontal sinus pressure. Mouth & Throat Lips: Upper Lip-  Normal: no dryness, cracking, pallor, cyanosis, or vesicular eruption. Lower Lip-Normal: no dryness, cracking, pallor, cyanosis or vesicular eruption. Buccal Mucosa- Bilateral- No Aphthous ulcers. Oropharynx- No Discharge or Erythema. Tonsils: Characteristics- Bilateral- No Erythema or Congestion. Size/Enlargement- Bilateral- No enlargement. Discharge- bilateral-None.  Neck Neck- Supple. No Masses.   Chest and Lung Exam Auscultation: Breath Sounds:-Clear even and unlabored.  Cardiovascular Auscultation:Rythm- Regular, rate and rhythm. Murmurs & Other Heart Sounds:Ausculatation of the heart reveal- No Murmurs.  Lymphatic Head & Neck General Head & Neck Lymphatics: Bilateral: Description- No Localized lymphadenopathy.           Assessment &  Plan:  For your probable flu exposure and recent symptoms will rx tamiflu.  For bronchitis and possible uti will rx levofloxin.  For cough benzonatate.  Rest and hydrate. Tylenol for fever.  Refilling your inhalers to use.  Check sugars daily while sick. If spinking over 250 let us know.  Work excuse given.  Note I wanted to get a urine culture on pt today but she just went to restroom before being seen and husband needed to go to ED asap. So urine studies deferred but did give strong antibiotic to cover urine.  Follow up in 7 days or as needed   Tyus Kallam, Percell Miller, [employer]

## 2016-11-17 NOTE — ED Notes (Signed)
Pt had no c/o dizziness or sob during orthostatic VS

## 2016-11-17 NOTE — ED Triage Notes (Addendum)
Pt is deaf. Pt reports abd pain with vomiting and dizziness that started this morning. Pt went to PCP this morning and given meds for flu exposure and cough, symptoms are worsening.

## 2016-11-17 NOTE — Patient Instructions (Addendum)
For your probable flu exposure and recent symptoms will rx tamiflu.  For bronchitis and possible uti will rx levofloxin.  For cough benzonatate.  Rest and hydrate. Tylenol for fever.  Refilling your inhalers to use.  Check sugars daily while sick. If spiking over 250 let us know.  Work excuse given.  Follow up in 7 days or as needed

## 2016-11-17 NOTE — ED Provider Notes (Signed)
[facility] Provider Note   CSN: [id number] Arrival date & time: 11/17/16  1453  By signing my name below, I, Dora Sims, attest that this documentation has been prepared under the direction and in the presence of Margarita Mail, PA-C. Electronically Signed: Dora Sims, Scribe. 11/17/2016. 5:30 PM.  History   Chief Complaint Chief Complaint  Patient presents with  . Abdominal Pain    The history is provided by the patient. A language interpreter was used.     HPI Comments: Rhonda Morrow is a 56 y.o. female with PMHx significant for GERD, asthma, HTN, and DM type II who presents to the Emergency Department complaining of sudden onset, intermittent, 6/10, generalized abdominal pain beginning this morning. She notes associated nausea, lightheadedness, and occasional cold sweats. She states she has occasionally experienced chest pain, SOB, chest tightness, and wheezing in association with her abdominal pain today; she tried her inhaler PTA with minimal improvement of these symptoms. Pt's last BM was this morning and she states it was smaller than usual. She notes her urine volume has been decreased lately. Pt reports she ate and drank shortly PTA which exacerbated her abdominal pain. She also reports abdominal pain exacerbation with inhalation. Pt has not tried any pain medications since onset of her abdominal pain. She reports a pertinent PSHx of C-section, cholecystectomy, and abdominal hernia repair. No family or personal h/o heart disease. No h/o HLD. Pt reports her blood sugar was 150 yesterday and 175 upon arrival to the ED today. She is a non-smoker. Pt denies syncope, dysuria, urinary urgency, vomiting, constipation, or any other associated symptoms.  Patient is deaf and her history was provided through a  Psychologist, sport and exercise.  Past Medical History:  Diagnosis Date  . Asthma   . Complication of anesthesia    one time woke up and was vey scared,17 yrs ago  . Deaf    . Diabetes mellitus without complication ([place])   . GERD (gastroesophageal reflux disease)   . Hypertension   . Left groin pain   . Thyroid disease     Patient Active Problem List   Diagnosis Date Noted  . History of laparoscopic cholecystectomy 06/20/2016  . Cerumen impaction 06/09/2016  . Hypersomnia 05/16/2016  . Knee pain, right 05/05/2016  . RUQ pain 04/09/2016  . Abnormal CT scan 03/28/2016  . Dyspnea and respiratory abnormalities 03/15/2016  . Asthma with acute exacerbation 03/15/2016  . Asthma in adult 02/25/2016  . DM (diabetes mellitus) type II controlled, neurological manifestation ([place]) 02/09/2016  . Left hip pain 12/23/2015  . Right hamstring muscle strain 11/18/2015  . Abdominal pain, acute 09/01/2015  . Acute asthma exacerbation 04/16/2015  . Acute bronchitis 04/14/2015  . Vaginal discharge 09/14/2014  . Pap smear for cervical cancer screening 09/14/2014  . Scabies 05/06/2014  . Bed bug bite 05/06/2014  . Allergic rhinitis 04/09/2014  . Rectal itching 04/09/2014  . Bladder spasm 04/09/2014  . Knee pain, left 03/05/2014  . Hypokalemia 02/19/2014  . Nausea with vomiting 02/19/2014  . Diabetes mellitus, type II ([place]) 02/19/2014  . Edema 02/16/2014  . Disorder of rotator cuff 11/12/2013  . Routine general medical examination at a health care facility 08/20/2013  . GERD (gastroesophageal reflux disease) 06/26/2013  . Dysphagia, pharyngoesophageal phase 06/26/2013  . Suprapubic pain 05/12/2013  . Medication side effect 03/25/2013  . Diarrhea 03/25/2013  . Benign positional vertigo 01/30/2013  . Traumatic hematoma of thigh 01/15/2013  . HTN (hypertension) 09/02/2012  . Dry skin 08/22/2012  .  External hemorrhoids 08/22/2012  . Obesity 06/30/2012  . Thyromegaly 05/22/2012  . Back pain 05/22/2012  . Elevated glucose 05/22/2012  . Moderate persistent asthma 04/19/2012  . Dyspnea 01/28/2012    Past Surgical History:  Procedure Laterality Date  . CARDIAC  CATHETERIZATION    . CESAREAN SECTION    . CHOLECYSTECTOMY N/A 06/20/2016   Procedure: LAPAROSCOPIC CHOLECYSTECTOMY;  Surgeon: Rolm Bookbinder, MD;  Location: [facility];  Service: General;  Laterality: N/A;  . ESOPHAGEAL MANOMETRY N/A 09/29/2013   Procedure: ESOPHAGEAL MANOMETRY (EM);  Surgeon: Milus Banister, MD;  Location: [facility];  Service: Endoscopy;  Laterality: N/A;    OB History    Gravida Para Term Preterm AB Living   3         3   SAB TAB Ectopic Multiple Live Births                   Home Medications    Prior to Admission medications   Medication Sig Start Date End Date Taking? Authorizing Provider  albuterol (PROVENTIL HFA;VENTOLIN HFA) 108 (90 Base) MCG/ACT inhaler Inhale 2 puffs into the lungs every 6 (six) hours as needed for wheezing. 11/17/16   Percell Miller Saguier, PA-C  amLODipine (NORVASC) 5 MG tablet TAKE 1 TABLET (5 MG TOTAL) BY MOUTH DAILY. 09/01/16   Rosalita Chessman Chase, DO  azelastine (ASTELIN) 0.1 % nasal spray Place 2 sprays into both nostrils 2 (two) times daily. Use in each nostril as directed 09/01/16   Rosalita Chessman Chase, DO  benzonatate (TESSALON) 100 MG capsule Take 1 capsule (100 mg total) by mouth 3 (three) times daily as needed for cough. 11/17/16   Percell Miller Saguier, PA-C  cetirizine (ZYRTEC) 10 MG tablet Take 1 tablet (10 mg total) by mouth daily. 09/01/16   Rosalita Chessman Chase, DO  diclofenac (VOLTAREN) 75 MG EC tablet TAKE 1 TABLET (75 MG TOTAL) BY MOUTH 2 (TWO) TIMES DAILY. 10/25/16   Rosalita Chessman Chase, DO  Fiber CHEW Chew 1 tablet by mouth daily.    Historical Provider, MD  fluticasone (FLONASE) 50 MCG/ACT nasal spray PLACE 2 SPRAYS INTO BOTH NOSTRILS DAILY. 09/01/16   Rosalita Chessman Chase, DO  glucose blood test strip Use as instructed--- one touch verio test strips 10/16/16   Yvonne R Lowne Chase, DO  hydrochlorothiazide (HYDRODIURIL) 12.5 MG tablet Take 1 tablet (12.5 mg total) by mouth daily. 02/23/16   Alferd Apa Lowne Chase, DO    ipratropium-albuterol (DUONEB) 0.5-2.5 (3) MG/3ML SOLN Take 3 mLs by nebulization every 6 (six) hours as needed. 02/25/16   Yvonne R Lowne Chase, DO  KLOR-CON M20 20 MEQ tablet TAKE 1 TABLET (20 MEQ TOTAL) BY MOUTH DAILY. 10/18/16   Alferd Apa Lowne Chase, DO  levofloxacin (LEVAQUIN) 500 MG tablet Take 1 tablet (500 mg total) by mouth daily. 11/17/16   Percell Miller Saguier, PA-C  meclizine (ANTIVERT) 25 MG tablet Take 1 tablet (25 mg total) by mouth 3 (three) times daily as needed. 01/30/13   Midge Minium, MD  metFORMIN (GLUCOPHAGE-XR) 500 MG 24 hr tablet TAKE 1 TABLET (500 MG TOTAL) BY MOUTH DAILY WITH BREAKFAST. 09/25/16   Alferd Apa Lowne Chase, DO  methocarbamol (ROBAXIN) 500 MG tablet Take 1 tablet (500 mg total) by mouth 2 (two) times daily. 08/27/15   Antonietta Breach, PA-C  mometasone-formoterol (DULERA) 100-5 MCG/ACT AERO Inhale 2 puffs into the lungs 2 (two) times daily. 11/17/16   Mackie Pai, PA-C  Multiple Vitamin (MULTIVITAMIN) tablet Take  1 tablet by mouth daily.    Historical Provider, MD  NONFORMULARY OR COMPOUNDED ITEM Nebulizer   DX ASTHMA 02/25/16   Rosalita Chessman Chase, DO  omeprazole (PRILOSEC) 20 MG capsule Take 1 capsule (20 mg total) by mouth daily. 08/21/16   Percell Miller Saguier, PA-C  oseltamivir (TAMIFLU) 75 MG capsule Take 1 capsule (75 mg total) by mouth 2 (two) times daily. 11/17/16   Percell Miller Saguier, PA-C  ranitidine (ZANTAC) 150 MG capsule Take 1 capsule (150 mg total) by mouth 2 (two) times daily. 10/10/16   Mackie Pai, PA-C  Spacer/Aero-Holding Chambers (AEROCHAMBER MV) inhaler Use as instructed 03/28/16   Melvenia Needles, NP  traMADol (ULTRAM) 50 MG tablet 1 tab at night as needed  moderate to severe pain 11/07/16   Mackie Pai, PA-C  zolpidem (AMBIEN) 10 MG tablet Take 1 tablet (10 mg total) by mouth at bedtime as needed for sleep. 08/21/16 11/07/17  Mackie Pai, PA-C    Family History Family History  Problem Relation Age of Onset  . Diabetes Mother   . Heart disease  Mother   . Diabetes Father     Social History Social History  Substance Use Topics  . Smoking status: Never Smoker  . Smokeless tobacco: Never Used  . Alcohol use No     Allergies   Losartan; Oxycodone-acetaminophen; and Augmentin [amoxicillin-pot clavulanate]   Review of Systems Review of Systems  A complete 10 system review of systems was obtained and all systems are negative except as noted in the HPI and PMH.   Physical Exam Updated Vital Signs BP (!) 107/49 (BP Location: Right Arm)   Pulse 107   Temp 97.6 F (36.4 C) (Oral)   Resp 20   Wt 223 lb (101.2 kg)   LMP 05/20/2012   SpO2 98%   BMI 38.28 kg/m   Physical Exam  Constitutional: She is oriented to person, place, and time. She appears well-developed and well-nourished. No distress.  Central obesity.  HENT:  Head: Normocephalic and atraumatic.  Eyes: Conjunctivae and EOM are normal. Pupils are equal, round, and reactive to light.  Neck: Neck supple. No JVD present. No tracheal deviation present.  Cardiovascular: Normal rate and regular rhythm.  Exam reveals no gallop and no friction rub.   No murmur heard. Pulmonary/Chest: Breath sounds normal. Tachypnea noted. No respiratory distress.  Breath sounds are normal, but she is tachypneic with shallow breathing.  Abdominal: Soft. Bowel sounds are normal. She exhibits distension. There is tenderness.  Diffusely tender.  Musculoskeletal: Normal range of motion.  Neurological: She is alert and oriented to person, place, and time.  Skin: Skin is warm and dry.  Psychiatric: She has a normal mood and affect. Her behavior is normal.  Nursing note and vitals reviewed.    ED Treatments / Results  Labs (all labs ordered are listed, but only abnormal results are displayed) Labs Reviewed  URINALYSIS, ROUTINE W REFLEX MICROSCOPIC - Abnormal; Notable for the following:       Result Value   Ketones, ur 15 (*)    Leukocytes, UA TRACE (*)    All other components  within normal limits  URINALYSIS, MICROSCOPIC (REFLEX) - Abnormal; Notable for the following:    Bacteria, UA RARE (*)    Squamous Epithelial / LPF 0-5 (*)    All other components within normal limits  CBG MONITORING, ED - Abnormal; Notable for the following:    Glucose-Capillary 175 (*)    All other components within normal limits  COMPREHENSIVE METABOLIC PANEL  TROPONIN I  LIPASE, BLOOD  CBC  CBG MONITORING, ED    EKG  EKG Interpretation None       Radiology No results found.  Procedures Procedures (including critical care time)  DIAGNOSTIC STUDIES: Oxygen Saturation is 98% on RA, normal by my interpretation.    COORDINATION OF CARE: 5:40 PM Discussed treatment plan with pt at bedside and pt agreed to plan.  Medications Ordered in ED Medications - No data to display   Initial Impression / Assessment and Plan / ED Course  I have reviewed the triage vital signs and the nursing notes.  Pertinent labs & imaging results that were available during my care of the patient were reviewed by me and considered in my medical decision making (see chart for details).  Clinical Course as of Nov 18 2323  Fri Nov 17, 2016  1738 Patient with onset of abdominal pain, nausea. The patient is diabetic with multiple risk factors for MI and central obesity. We will evaluate her for cardiac cause, although she does have diffuse abdominal tenderness. This may represent an intra-abdominal process. She is a mildly elevated white blood cell count. Other labs are pending.  [AH]    Clinical Course User Index [AH] Margarita Mail, PA-C     Heart score 3 , low risk for MACE Patient has 2 negative troponins and is feeling much better.  Patient is nontoxic, nonseptic appearing, in no apparent distress.  Patient's pain and other symptoms adequately managed in emergency department.  Fluid bolus given.  Labs, imaging and vitals reviewed.  Patient does not meet the SIRS or Sepsis criteria.  On repeat  exam patient does not have a surgical abdomen and there are no peritoneal signs.  No indication of appendicitis, bowel obstruction, bowel perforation, cholecystitis, diverticulitis, PID or ectopic pregnancy.  Patient discharged home with symptomatic treatment and given strict instructions for follow-up with their primary care physician.  I have also discussed reasons to return immediately to the ER.  Patient expresses understanding and agrees with plan.     Final Clinical Impressions(s) / ED Diagnoses   Final diagnoses:  Generalized abdominal pain  Nausea    New Prescriptions New Prescriptions   No medications on file   I personally performed the services described in this documentation, which was scribed in my presence. The recorded information has been reviewed and is accurate.      Margarita Mail, PA-C 11/17/16 [place], MD 11/18/16 1045

## 2016-11-17 NOTE — ED Notes (Signed)
Transported to xray 

## 2016-11-17 NOTE — Discharge Instructions (Signed)

## 2016-11-17 NOTE — Progress Notes (Signed)
Pre visit review using our clinic review tool, if applicable. No additional management support is needed unless otherwise documented below in the visit note/SLS  

## 2016-11-21 NOTE — Progress Notes (Signed)
   Subjective:    Patient ID: Rhonda Morrow, female    DOB: [birth year], 56 y.o.   MRN: [id number]  HPI Pt transaltor never showed and when translator showed pt appointment time already passed.Rescheduled next day.   Review of Systems     Objective:   Physical Exam        Assessment & Plan:

## 2016-12-04 ENCOUNTER — Ambulatory Visit (HOSPITAL_BASED_OUTPATIENT_CLINIC_OR_DEPARTMENT_OTHER)
Admission: RE | Admit: 2016-12-04 | Discharge: 2016-12-04 | Disposition: A | Discharge status: Home / Self Care | Payer: Federal, State, Local not specified - PPO | Source: Ambulatory Visit | Attending: Family Medicine | Admitting: Family Medicine

## 2016-12-04 ENCOUNTER — Encounter: Payer: Self-pay | Admitting: Family Medicine

## 2016-12-04 ENCOUNTER — Ambulatory Visit (INDEPENDENT_AMBULATORY_CARE_PROVIDER_SITE_OTHER): Payer: Federal, State, Local not specified - PPO | Admitting: Family Medicine

## 2016-12-04 VITALS — BP 142/90 | HR 92 | Temp 98.0°F | Resp 16 | Ht 64.0 in | Wt 228.2 lb

## 2016-12-04 DIAGNOSIS — I10 Essential (primary) hypertension: Secondary | ICD-10-CM

## 2016-12-04 DIAGNOSIS — E1165 Type 2 diabetes mellitus with hyperglycemia: Secondary | ICD-10-CM

## 2016-12-04 DIAGNOSIS — IMO0002 Reserved for concepts with insufficient information to code with codable children: Secondary | ICD-10-CM

## 2016-12-04 DIAGNOSIS — M25552 Pain in left hip: Secondary | ICD-10-CM | POA: Diagnosis present

## 2016-12-04 DIAGNOSIS — E1149 Type 2 diabetes mellitus with other diabetic neurological complication: Secondary | ICD-10-CM

## 2016-12-04 DIAGNOSIS — R42 Dizziness and giddiness: Secondary | ICD-10-CM

## 2016-12-04 DIAGNOSIS — E11620 Type 2 diabetes mellitus with diabetic dermatitis: Secondary | ICD-10-CM

## 2016-12-04 DIAGNOSIS — M1612 Unilateral primary osteoarthritis, left hip: Secondary | ICD-10-CM | POA: Insufficient documentation

## 2016-12-04 DIAGNOSIS — E785 Hyperlipidemia, unspecified: Secondary | ICD-10-CM

## 2016-12-04 DIAGNOSIS — E1169 Type 2 diabetes mellitus with other specified complication: Secondary | ICD-10-CM

## 2016-12-04 DIAGNOSIS — R350 Frequency of micturition: Secondary | ICD-10-CM

## 2016-12-04 LAB — CBC WITH DIFFERENTIAL/PLATELET
BASOS ABS: 0 10*3/uL (ref 0.0–0.1)
Basophils Relative: 0.6 % (ref 0.0–3.0)
EOS PCT: 1.5 % (ref 0.0–5.0)
Eosinophils Absolute: 0.1 10*3/uL (ref 0.0–0.7)
HCT: 42.4 % (ref 36.0–46.0)
Hemoglobin: 13.9 g/dL (ref 12.0–15.0)
Lymphocytes Relative: 28.5 % (ref 12.0–46.0)
Lymphs Abs: 1.9 10*3/uL (ref 0.7–4.0)
MCHC: 32.9 g/dL (ref 30.0–36.0)
MCV: 84 fl (ref 78.0–100.0)
MONOS PCT: 8.1 % (ref 3.0–12.0)
Monocytes Absolute: 0.5 10*3/uL (ref 0.1–1.0)
NEUTROS PCT: 61.3 % (ref 43.0–77.0)
Neutro Abs: 4 10*3/uL (ref 1.4–7.7)
Platelets: 215 10*3/uL (ref 150.0–400.0)
RBC: 5.05 Mil/uL (ref 3.87–5.11)
RDW: 14.5 % (ref 11.5–15.5)
WBC: 6.6 10*3/uL (ref 4.0–10.5)

## 2016-12-04 LAB — POC URINALSYSI DIPSTICK (AUTOMATED)
BILIRUBIN UA: NEGATIVE
Glucose, UA: NEGATIVE
KETONES UA: NEGATIVE
LEUKOCYTES UA: NEGATIVE
Nitrite, UA: NEGATIVE
PH UA: 8
PROTEIN UA: NEGATIVE
RBC UA: NEGATIVE
SPEC GRAV UA: 1.02
Urobilinogen, UA: >=8

## 2016-12-04 LAB — COMPREHENSIVE METABOLIC PANEL
ALBUMIN: 3.9 g/dL (ref 3.5–5.2)
ALK PHOS: 78 U/L (ref 39–117)
ALT: 28 U/L (ref 0–35)
AST: 19 U/L (ref 0–37)
BILIRUBIN TOTAL: 0.5 mg/dL (ref 0.2–1.2)
BUN: 14 mg/dL (ref 6–23)
CO2: 32 mEq/L (ref 19–32)
Calcium: 10 mg/dL (ref 8.4–10.5)
Chloride: 103 mEq/L (ref 96–112)
Creatinine, Ser: 0.79 mg/dL (ref 0.40–1.20)
GFR: 97.82 mL/min (ref >60.00)
GLUCOSE: 88 mg/dL (ref 70–99)
POTASSIUM: 3.8 meq/L (ref 3.5–5.1)
SODIUM: 142 meq/L (ref 135–145)
TOTAL PROTEIN: 7.4 g/dL (ref 6.0–8.3)

## 2016-12-04 LAB — LIPID PANEL
CHOL/HDL RATIO: 3
Cholesterol: 175 mg/dL (ref 0–200)
HDL: 68.1 mg/dL (ref >39.00)
LDL Cholesterol: 91 mg/dL (ref 0–99)
NONHDL: 107.09
Triglycerides: 80 mg/dL (ref 0.0–149.0)
VLDL: 16 mg/dL (ref 0.0–40.0)

## 2016-12-04 LAB — HEMOGLOBIN A1C: Hgb A1c MFr Bld: 6.6 % — ABNORMAL HIGH (ref 4.6–6.5)

## 2016-12-04 LAB — VITAMIN B12: Vitamin B-12: 285 pg/mL (ref 211–911)

## 2016-12-04 LAB — TSH: TSH: 0.87 u[IU]/mL (ref 0.35–4.50)

## 2016-12-04 MED ORDER — RANITIDINE HCL 150 MG PO CAPS: 150.0000 mg | ORAL_CAPSULE | Freq: Two times a day (BID) | ORAL | 1 refills | Status: DC

## 2016-12-04 MED ORDER — MELOXICAM 15 MG PO TABS: ORAL_TABLET | ORAL | 2 refills | Status: DC

## 2016-12-04 MED ORDER — AMLODIPINE BESYLATE 5 MG PO TABS: ORAL_TABLET | ORAL | 1 refills | Status: DC

## 2016-12-04 NOTE — Progress Notes (Signed)
Pre visit review using our clinic review tool, if applicable. No additional management support is needed unless otherwise documented below in the visit note. 

## 2016-12-04 NOTE — Patient Instructions (Signed)
Carbohydrate Counting for Diabetes Mellitus, Adult Carbohydrate counting is a method for keeping track of how many carbohydrates you eat. Eating carbohydrates naturally increases the amount of sugar (glucose) in the blood. Counting how many carbohydrates you eat helps keep your blood glucose within normal limits, which helps you manage your diabetes (diabetes mellitus). It is important to know how many carbohydrates you can safely have in each meal. This is different for every person. A diet and nutrition specialist (registered dietitian) can help you make a meal plan and calculate how many carbohydrates you should have at each meal and snack. Carbohydrates are found in the following foods:  Grains, such as breads and cereals.  Dried beans and soy products.  Starchy vegetables, such as potatoes, peas, and corn.  Fruit and fruit juices.  Milk and yogurt.  Sweets and snack foods, such as cake, cookies, candy, chips, and soft drinks. How do I count carbohydrates? There are two ways to count carbohydrates in food. You can use either of the methods or a combination of both. Reading "Nutrition Facts" on packaged food  The "Nutrition Facts" list is included on the labels of almost all packaged foods and beverages in the [place] It includes:  The serving size.  Information about nutrients in each serving, including the grams (g) of carbohydrate per serving. To use the "Nutrition Facts":  Decide how many servings you will have.  Multiply the number of servings by the number of carbohydrates per serving.  The resulting number is the total amount of carbohydrates that you will be having. Learning standard serving sizes of other foods  When you eat foods containing carbohydrates that are not packaged or do not include "Nutrition Facts" on the label, you need to measure the servings in order to count the amount of carbohydrates:  Measure the foods that you will eat with a food scale or measuring  cup, if needed.  Decide how many standard-size servings you will eat.  Multiply the number of servings by 15. Most carbohydrate-rich foods have about 15 g of carbohydrates per serving.  For example, if you eat 8 oz (170 g) of strawberries, you will have eaten 2 servings and 30 g of carbohydrates (2 servings x 15 g = 30 g).  For foods that have more than one food mixed, such as soups and casseroles, you must count the carbohydrates in each food that is included. The following list contains standard serving sizes of common carbohydrate-rich foods. Each of these servings has about 15 g of carbohydrates:   hamburger bun or  English muffin.   oz (15 mL) syrup.   oz (14 g) jelly.  1 slice of bread.  1 six-inch tortilla.  3 oz (85 g) cooked rice or pasta.  4 oz (113 g) cooked dried beans.  4 oz (113 g) starchy vegetable, such as peas, corn, or potatoes.  4 oz (113 g) hot cereal.  4 oz (113 g) mashed potatoes or  of a large baked potato.  4 oz (113 g) canned or frozen fruit.  4 oz (120 mL) fruit juice.  4-6 crackers.  6 chicken nuggets.  6 oz (170 g) unsweetened dry cereal.  6 oz (170 g) plain fat-free yogurt or yogurt sweetened with artificial sweeteners.  8 oz (240 mL) milk.  8 oz (170 g) fresh fruit or one small piece of fruit.  24 oz (680 g) popped popcorn. Example of carbohydrate counting Sample meal  3 oz (85 g) chicken breast.  6 oz (  170 g) [place] rice.  4 oz (113 g) corn.  8 oz (240 mL) milk.  8 oz (170 g) strawberries with sugar-free whipped topping. Carbohydrate calculation 1. Identify the foods that contain carbohydrates:  Rice.  Corn.  Milk.  Strawberries. 2. Calculate how many servings you have of each food:  2 servings rice.  1 serving corn.  1 serving milk.  1 serving strawberries. 3. Multiply each number of servings by 15 g:  2 servings rice x 15 g = 30 g.  1 serving corn x 15 g = 15 g.  1 serving milk x 15 g = 15  g.  1 serving strawberries x 15 g = 15 g. 4. Add together all of the amounts to find the total grams of carbohydrates eaten:  30 g + 15 g + 15 g + 15 g = 75 g of carbohydrates total. This information is not intended to replace advice given to you by your health care provider. Make sure you discuss any questions you have with your health care provider. Document Released: 10/23/2005 Document Revised: 05/12/2016 Document Reviewed: 04/05/2016 Elsevier Interactive Patient Education  2017 [employer]  

## 2016-12-04 NOTE — Assessment & Plan Note (Signed)
Check xray mobic for pain  F/u ortho

## 2016-12-04 NOTE — Assessment & Plan Note (Signed)
Stable con't meds 

## 2016-12-04 NOTE — Assessment & Plan Note (Signed)
Check labs con't meds 

## 2016-12-04 NOTE — Progress Notes (Signed)
Subjective:    Patient ID: Rhonda Morrow, female    DOB: Jun 04, 1961, 56 y.o.   MRN: [id number]  Chief Complaint  Patient presents with  . Follow-up    diabetes, blood pressure and cholesterol    HPI Patient is in today for follow up diabetes, cholesterol, and blood pressure.  HPI HYPERTENSION   Blood pressure range-not checking   Chest pain- no      Dyspnea- no Lightheadedness- sometimes   Edema- no  Other side effects - no   Medication compliance: yes Low salt diet- yes    DIABETES    Blood Sugar ranges-not checking   Polyuria- yes New Visual problems- no  Hypoglycemic symptoms- no  Other side effects-no Medication compliance - good Last eye exam- due Foot exam- today   HYPERLIPIDEMIA  Medication compliance- good RUQ pain- no  Muscle aches- no Other side effects-no   Past Medical History:  Diagnosis Date  . Asthma   . Complication of anesthesia    one time woke up and was vey scared,17 yrs ago  . Deaf   . Diabetes mellitus without complication ([place])   . GERD (gastroesophageal reflux disease)   . Hypertension   . Left groin pain   . Thyroid disease     Past Surgical History:  Procedure Laterality Date  . CARDIAC CATHETERIZATION    . CESAREAN SECTION    . CHOLECYSTECTOMY N/A 06/20/2016   Procedure: LAPAROSCOPIC CHOLECYSTECTOMY;  Surgeon: Rolm Bookbinder, MD;  Location: [place];  Service: General;  Laterality: N/A;  . ESOPHAGEAL MANOMETRY N/A 09/29/2013   Procedure: ESOPHAGEAL MANOMETRY (EM);  Surgeon: Milus Banister, MD;  Location: [facility];  Service: Endoscopy;  Laterality: N/A;    Family History  Problem Relation Age of Onset  . Diabetes Mother   . Heart disease Mother   . Diabetes Father     Social History   Social History  . Marital status: Married    Spouse name: N/A  . Number of children: N/A  . Years of education: N/A   Occupational History  . diabled    Social History Main Topics  . Smoking status: Never Smoker  . Smokeless  tobacco: Never Used  . Alcohol use No  . Drug use: No  . Sexual activity: No   Other Topics Concern  . Not on file   Social History Narrative  . No narrative on file    Outpatient Medications Prior to Visit  Medication Sig Dispense Refill  . albuterol (PROVENTIL HFA;VENTOLIN HFA) 108 (90 Base) MCG/ACT inhaler Inhale 2 puffs into the lungs every 6 (six) hours as needed for wheezing. 1 Inhaler 3  . azelastine (ASTELIN) 0.1 % nasal spray Place 2 sprays into both nostrils 2 (two) times daily. Use in each nostril as directed 30 mL 3  . cetirizine (ZYRTEC) 10 MG tablet Take 1 tablet (10 mg total) by mouth daily. 30 tablet 11  . diclofenac (VOLTAREN) 75 MG EC tablet TAKE 1 TABLET (75 MG TOTAL) BY MOUTH 2 (TWO) TIMES DAILY. 60 tablet 1  . dicyclomine (BENTYL) 20 MG tablet Take 1 tablet (20 mg total) by mouth 2 (two) times daily. 20 tablet 0  . Fiber CHEW Chew 1 tablet by mouth daily.    . fluticasone (FLONASE) 50 MCG/ACT nasal spray PLACE 2 SPRAYS INTO BOTH NOSTRILS DAILY. 16 g 4  . glucose blood test strip Use as instructed--- one touch verio test strips 100 each 12  . hydrochlorothiazide (HYDRODIURIL) 12.5 MG tablet Take  1 tablet (12.5 mg total) by mouth daily. 90 tablet 1  . ipratropium-albuterol (DUONEB) 0.5-2.5 (3) MG/3ML SOLN Take 3 mLs by nebulization every 6 (six) hours as needed. 360 mL 3  . KLOR-CON M20 20 MEQ tablet TAKE 1 TABLET (20 MEQ TOTAL) BY MOUTH DAILY. 90 tablet 1  . meclizine (ANTIVERT) 25 MG tablet Take 1 tablet (25 mg total) by mouth 3 (three) times daily as needed. 45 tablet 3  . metFORMIN (GLUCOPHAGE-XR) 500 MG 24 hr tablet TAKE 1 TABLET (500 MG TOTAL) BY MOUTH DAILY WITH BREAKFAST. 90 tablet 1  . methocarbamol (ROBAXIN) 500 MG tablet Take 1 tablet (500 mg total) by mouth 2 (two) times daily. 20 tablet 0  . mometasone-formoterol (DULERA) 100-5 MCG/ACT AERO Inhale 2 puffs into the lungs 2 (two) times daily. 1 Inhaler 5  . Multiple Vitamin (MULTIVITAMIN) tablet Take 1  tablet by mouth daily.    . NONFORMULARY OR COMPOUNDED ITEM Nebulizer   DX ASTHMA 1 each 0  . omeprazole (PRILOSEC) 20 MG capsule Take 1 capsule (20 mg total) by mouth daily. 30 capsule 2  . ondansetron (ZOFRAN) 4 MG tablet Take 1 tablet (4 mg total) by mouth every 8 (eight) hours as needed for nausea or vomiting. 10 tablet 0  . Spacer/Aero-Holding Chambers (AEROCHAMBER MV) inhaler Use as instructed 1 each 0  . traMADol (ULTRAM) 50 MG tablet 1 tab at night as needed  moderate to severe pain 30 tablet 0  . zolpidem (AMBIEN) 10 MG tablet Take 1 tablet (10 mg total) by mouth at bedtime as needed for sleep. 15 tablet 1  . amLODipine (NORVASC) 5 MG tablet TAKE 1 TABLET (5 MG TOTAL) BY MOUTH DAILY. 90 tablet 1  . benzonatate (TESSALON) 100 MG capsule Take 1 capsule (100 mg total) by mouth 3 (three) times daily as needed for cough. 21 capsule 0  . levofloxacin (LEVAQUIN) 500 MG tablet Take 1 tablet (500 mg total) by mouth daily. 7 tablet 0  . oseltamivir (TAMIFLU) 75 MG capsule Take 1 capsule (75 mg total) by mouth 2 (two) times daily. 10 capsule 0  . ranitidine (ZANTAC) 150 MG capsule Take 1 capsule (150 mg total) by mouth 2 (two) times daily. 60 capsule 0   No facility-administered medications prior to visit.     Allergies  Allergen Reactions  . Losartan Shortness Of Breath  . Oxycodone-Acetaminophen Nausea And Vomiting  . Augmentin [Amoxicillin-Pot Clavulanate] Diarrhea    Review of Systems  Constitutional: Negative for fever and malaise/fatigue.  HENT: Negative for congestion.   Eyes: Negative for blurred vision.  Respiratory: Negative for cough and shortness of breath.   Cardiovascular: Negative for chest pain, palpitations and leg swelling.  Gastrointestinal: Negative for vomiting.  Musculoskeletal: Negative for back pain.  Skin: Negative for rash.  Neurological: Negative for loss of consciousness and headaches.       Objective:    Physical Exam  Constitutional: She is oriented  to person, place, and time. She appears well-developed and well-nourished. No distress.  HENT:  Head: Normocephalic and atraumatic.  Eyes: Conjunctivae are normal.  Neck: Normal range of motion. No thyromegaly present.  Cardiovascular: Normal rate and regular rhythm.   Pulmonary/Chest: Effort normal and breath sounds normal. She has no wheezes.  Abdominal: Soft. Bowel sounds are normal. There is no tenderness.  Musculoskeletal: She exhibits tenderness. She exhibits no edema or deformity.       Left hip: She exhibits decreased range of motion, decreased strength and tenderness.  Legs: Neurological: She is alert and oriented to person, place, and time.  Skin: Skin is warm and dry. She is not diaphoretic.  Psychiatric: She has a normal mood and affect.    BP (!) 142/90 (BP Location: Left Arm, Cuff Size: Normal)   Pulse 92   Temp 98 F (36.7 C) (Oral)   Resp 16   Ht 5\' 4"  (1.626 m)   Wt 228 lb 3.2 oz (103.5 kg)   LMP 05/20/2012   SpO2 98%   BMI 39.17 kg/m  Wt Readings from Last 3 Encounters:  12/04/16 228 lb 3.2 oz (103.5 kg)  11/17/16 223 lb (101.2 kg)  11/17/16 223 lb 6 oz (101.3 kg)     Lab Results  Component Value Date   WBC 6.6 12/04/2016   HGB 13.9 12/04/2016   HCT 42.4 12/04/2016   PLT 215.0 12/04/2016   GLUCOSE 88 12/04/2016   CHOL 175 12/04/2016   TRIG 80.0 12/04/2016   HDL 68.10 12/04/2016   LDLCALC 91 12/04/2016   ALT 28 12/04/2016   AST 19 12/04/2016   NA 142 12/04/2016   K 3.8 12/04/2016   CL 103 12/04/2016   CREATININE 0.79 12/04/2016   BUN 14 12/04/2016   CO2 32 12/04/2016   TSH 0.87 12/04/2016   INR 1.00 12/30/2012   HGBA1C 6.6 (H) 12/04/2016   MICROALBUR 0.8 10/27/2015    Lab Results  Component Value Date   TSH 0.87 12/04/2016   Lab Results  Component Value Date   WBC 6.6 12/04/2016   HGB 13.9 12/04/2016   HCT 42.4 12/04/2016   MCV 84.0 12/04/2016   PLT 215.0 12/04/2016   Lab Results  Component Value Date   NA 142 12/04/2016     K 3.8 12/04/2016   CO2 32 12/04/2016   GLUCOSE 88 12/04/2016   BUN 14 12/04/2016   CREATININE 0.79 12/04/2016   BILITOT 0.5 12/04/2016   ALKPHOS 78 12/04/2016   AST 19 12/04/2016   ALT 28 12/04/2016   PROT 7.4 12/04/2016   ALBUMIN 3.9 12/04/2016   CALCIUM 10.0 12/04/2016   ANIONGAP 11 11/17/2016   GFR 97.82 12/04/2016   Lab Results  Component Value Date   CHOL 175 12/04/2016   Lab Results  Component Value Date   HDL 68.10 12/04/2016   Lab Results  Component Value Date   LDLCALC 91 12/04/2016   Lab Results  Component Value Date   TRIG 80.0 12/04/2016   Lab Results  Component Value Date   CHOLHDL 3 12/04/2016   Lab Results  Component Value Date   HGBA1C 6.6 (H) 12/04/2016       Assessment & Plan:   Problem List Items Addressed This Visit      Unprioritized   HTN (hypertension)   Relevant Medications   amLODipine (NORVASC) 5 MG tablet   Other Relevant Orders   Lipid panel (Completed)   CBC with Differential/Platelet (Completed)   Comprehensive metabolic panel (Completed)   TSH (Completed)   Left hip pain    Check xray mobic for pain  F/u ortho       Relevant Medications   meloxicam (MOBIC) 15 MG tablet   Other Relevant Orders   DG HIP UNILAT WITH PELVIS 2-3 VIEWS LEFT (Completed)    Other Visit Diagnoses    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, without long-term current use of insulin ([place])    -  Primary   Relevant Orders   Hemoglobin A1c (Completed)   Hyperlipidemia associated  with type 2 diabetes mellitus ([facility])       Relevant Medications   amLODipine (NORVASC) 5 MG tablet   Other Relevant Orders   Lipid panel (Completed)   CBC with Differential/Platelet (Completed)   Comprehensive metabolic panel (Completed)   TSH (Completed)   Vitamin B12 (Completed)   Dizziness       Relevant Orders   Lipid panel (Completed)   CBC with Differential/Platelet (Completed)   Comprehensive metabolic panel (Completed)   TSH  (Completed)   Vitamin B12 (Completed)   Urinary frequency       Relevant Orders   POCT Urinalysis Dipstick (Automated) (Completed)      I have discontinued Ms. Guinta's oseltamivir, benzonatate, and levofloxacin. I am also having her start on meloxicam. Additionally, I am having her maintain her meclizine, multivitamin, Fiber, methocarbamol, hydrochlorothiazide, ipratropium-albuterol, NONFORMULARY OR COMPOUNDED ITEM, AEROCHAMBER MV, omeprazole, zolpidem, azelastine, cetirizine, fluticasone, metFORMIN, glucose blood, KLOR-CON M20, diclofenac, traMADol, albuterol, mometasone-formoterol, dicyclomine, ondansetron, amLODipine, and ranitidine.  Meds ordered this encounter  Medications  . amLODipine (NORVASC) 5 MG tablet    Sig: TAKE 1 TABLET (5 MG TOTAL) BY MOUTH DAILY.    Dispense:  90 tablet    Refill:  1  . ranitidine (ZANTAC) 150 MG capsule    Sig: Take 1 capsule (150 mg total) by mouth 2 (two) times daily.    Dispense:  180 capsule    Refill:  1  . meloxicam (MOBIC) 15 MG tablet    Sig: 1/2-1 po qd prn pain    Dispense:  30 tablet    Refill:  2    CMA served as scribe during this visit. History, Physical and Plan performed by medical provider. Documentation and orders reviewed and attested to.   Ann Held, DO

## 2016-12-05 ENCOUNTER — Encounter: Payer: Self-pay | Admitting: Family Medicine

## 2016-12-05 ENCOUNTER — Telehealth: Payer: Self-pay | Admitting: *Deleted

## 2016-12-05 ENCOUNTER — Ambulatory Visit (INDEPENDENT_AMBULATORY_CARE_PROVIDER_SITE_OTHER): Payer: Federal, State, Local not specified - PPO | Admitting: Family Medicine

## 2016-12-05 DIAGNOSIS — M25552 Pain in left hip: Secondary | ICD-10-CM

## 2016-12-05 DIAGNOSIS — M67912 Unspecified disorder of synovium and tendon, left shoulder: Secondary | ICD-10-CM | POA: Diagnosis not present

## 2016-12-05 NOTE — Telephone Encounter (Signed)
Patient notified of results of xray of hip and referral made for ortho for possible AVM

## 2016-12-06 NOTE — Progress Notes (Signed)
PCP: Ann Held, DO Referred by Dr. Larose Kells  Subjective:   HPI: Patient is a 56 y.o. female here for left shoulder injury.  8/18: Interpreter used throughout visit. Patient reports injury occurred on 7/29 at work. She drives a forklift at work. Felt a pop in left shoulder while doing so - mentioned some of equipment was broken and she did not realize this. Seemed to be mild but later that day when doing sign language she felt a lot of pain in the left shoulder. Pain currently 7/10. Tried aleve, advil, icing. Has not had radiographs. + night pain. Is right handed. Remotely  Had problems with this joint - had an injection and completely improved.  9/22: Interpreter present for visit. Patient reports pain still 6/10 at work, 5/10 at rest. Has not heard yet about physical therapy. No other changes.  11/7: Interpreter present. Unfortunately patient still has not heard about physical therapy. Pain level 5/10. No skin changes, fever, other complaints. Using pain pills, occasional ice. Doing home exercises.  12/19: Patient reports only slight improvement from last visit - 4/10 level pain, dull lateral left shoulder. Is taking voltaren but needs a refill - this helps. Avoiding overhead activities when possible because they bother her. Doing home exercises. Has not heard from workers comp regarding physical therapy. No skin changes, other complaints.  12/06/15: Patient feels mildly improved though still has not had physical therapy approved nor an MRI. Pain level 2/10 at rest, dull lateral left shoulder. Worse with overhead motions. No skin changes, other complaints. Doing home exercises.  3/30: Patient reports she has mainly been resting her shoulder now. No pain currently - gets some soreness with full motions. Pain level 0/10. No skin changes, fever, other complaints. No radiation.  5/15: Patient reports she feels much better. Still notices pain especially  with weather change, a lot of movement. Pain currently 0/10. Taking diclofenac as needed. No skin changes, fever, other complaints.  9/5: Patient reports she's doing well. No pain currently in left shoulder. Not doing home exercises because she's resting from recent gall bladder surgery. No skin changes, numbness.  11/7: Patient returns with 4 days of worsening left shoulder pain. She had been doing extremely well then reports she 'slept wrong' 4 days ago. Since then has had pain lateral left shoulder, upper arm. Has been icing, taking voltaren and tramadol Worse trying to reach overhead and forward, lying on side. Pain 5/10, sharp. No skin changes, numbness.  12/19: Patient reports she has a little pain but has improved since last visit. Pain is 2-3/10, sharp. Worse with overhead motions. Doing home exercises. Taking diclofenac some. No skin changes, numbness. Pain has left hip pain worsening past month, sharp. Feels anteriorly in groin.  12/05/16: Patient returns reporting she is doing well. Cortisone shot of left hip only helped for a few days this time. Pain worse with ambulation felt in groin area more than lateral left hip, sharp. Taking diclofenac. Had repeat set of radiographs done of hip. Reports shoulder is improving - states pain 0/10 but still bothers her trying to reach overhead. Doing some home exercises. No skin changes, numbness.  Past Medical History:  Diagnosis Date  . Asthma   . Complication of anesthesia    one time woke up and was vey scared,17 yrs ago  . Deaf   . Diabetes mellitus without complication ([place])   . GERD (gastroesophageal reflux disease)   . Hypertension   . Left groin pain   .  Thyroid disease     Current Outpatient Prescriptions on File Prior to Visit  Medication Sig Dispense Refill  . albuterol (PROVENTIL HFA;VENTOLIN HFA) 108 (90 Base) MCG/ACT inhaler Inhale 2 puffs into the lungs every 6 (six) hours as needed for wheezing. 1  Inhaler 3  . amLODipine (NORVASC) 5 MG tablet TAKE 1 TABLET (5 MG TOTAL) BY MOUTH DAILY. 90 tablet 1  . azelastine (ASTELIN) 0.1 % nasal spray Place 2 sprays into both nostrils 2 (two) times daily. Use in each nostril as directed 30 mL 3  . cetirizine (ZYRTEC) 10 MG tablet Take 1 tablet (10 mg total) by mouth daily. 30 tablet 11  . diclofenac (VOLTAREN) 75 MG EC tablet TAKE 1 TABLET (75 MG TOTAL) BY MOUTH 2 (TWO) TIMES DAILY. 60 tablet 1  . dicyclomine (BENTYL) 20 MG tablet Take 1 tablet (20 mg total) by mouth 2 (two) times daily. 20 tablet 0  . Fiber CHEW Chew 1 tablet by mouth daily.    . fluticasone (FLONASE) 50 MCG/ACT nasal spray PLACE 2 SPRAYS INTO BOTH NOSTRILS DAILY. 16 g 4  . glucose blood test strip Use as instructed--- one touch verio test strips 100 each 12  . hydrochlorothiazide (HYDRODIURIL) 12.5 MG tablet Take 1 tablet (12.5 mg total) by mouth daily. 90 tablet 1  . ipratropium-albuterol (DUONEB) 0.5-2.5 (3) MG/3ML SOLN Take 3 mLs by nebulization every 6 (six) hours as needed. 360 mL 3  . KLOR-CON M20 20 MEQ tablet TAKE 1 TABLET (20 MEQ TOTAL) BY MOUTH DAILY. 90 tablet 1  . meclizine (ANTIVERT) 25 MG tablet Take 1 tablet (25 mg total) by mouth 3 (three) times daily as needed. 45 tablet 3  . meloxicam (MOBIC) 15 MG tablet 1/2-1 po qd prn pain 30 tablet 2  . metFORMIN (GLUCOPHAGE-XR) 500 MG 24 hr tablet TAKE 1 TABLET (500 MG TOTAL) BY MOUTH DAILY WITH BREAKFAST. 90 tablet 1  . methocarbamol (ROBAXIN) 500 MG tablet Take 1 tablet (500 mg total) by mouth 2 (two) times daily. 20 tablet 0  . mometasone-formoterol (DULERA) 100-5 MCG/ACT AERO Inhale 2 puffs into the lungs 2 (two) times daily. 1 Inhaler 5  . Multiple Vitamin (MULTIVITAMIN) tablet Take 1 tablet by mouth daily.    . NONFORMULARY OR COMPOUNDED ITEM Nebulizer   DX ASTHMA 1 each 0  . omeprazole (PRILOSEC) 20 MG capsule Take 1 capsule (20 mg total) by mouth daily. 30 capsule 2  . ondansetron (ZOFRAN) 4 MG tablet Take 1 tablet (4 mg  total) by mouth every 8 (eight) hours as needed for nausea or vomiting. 10 tablet 0  . ranitidine (ZANTAC) 150 MG capsule Take 1 capsule (150 mg total) by mouth 2 (two) times daily. 180 capsule 1  . Spacer/Aero-Holding Chambers (AEROCHAMBER MV) inhaler Use as instructed 1 each 0  . traMADol (ULTRAM) 50 MG tablet 1 tab at night as needed  moderate to severe pain 30 tablet 0  . zolpidem (AMBIEN) 10 MG tablet Take 1 tablet (10 mg total) by mouth at bedtime as needed for sleep. 15 tablet 1   No current facility-administered medications on file prior to visit.     Past Surgical History:  Procedure Laterality Date  . CARDIAC CATHETERIZATION    . CESAREAN SECTION    . CHOLECYSTECTOMY N/A 06/20/2016   Procedure: LAPAROSCOPIC CHOLECYSTECTOMY;  Surgeon: Rolm Bookbinder, MD;  Location: [place];  Service: General;  Laterality: N/A;  . ESOPHAGEAL MANOMETRY N/A 09/29/2013   Procedure: ESOPHAGEAL MANOMETRY (EM);  Surgeon: Melene Plan  Ardis Hughs, MD;  Location: [facility];  Service: Endoscopy;  Laterality: N/A;    Allergies  Allergen Reactions  . Losartan Shortness Of Breath  . Oxycodone-Acetaminophen Nausea And Vomiting  . Augmentin [Amoxicillin-Pot Clavulanate] Diarrhea    Social History   Social History  . Marital status: Married    Spouse name: N/A  . Number of children: N/A  . Years of education: N/A   Occupational History  . diabled    Social History Main Topics  . Smoking status: Never Smoker  . Smokeless tobacco: Never Used  . Alcohol use No  . Drug use: No  . Sexual activity: No   Other Topics Concern  . Not on file   Social History Narrative  . No narrative on file    Family History  Problem Relation Age of Onset  . Diabetes Mother   . Heart disease Mother   . Diabetes Father     Ht 5\' 4"  (1.626 m)   Wt 227 lb (103 kg)   LMP 05/20/2012   BMI 38.96 kg/m   Review of Systems: See HPI above.    Objective:  Physical Exam:  Gen: NAD  Left shoulder: No swelling,  ecchymoses.  No gross deformity. No TTP. Only 50 degrees ER, 100 abduction, 100 flexion - all painful. Positive Hawkins, Neers. Negative Yergasons. Strength 4/5 with empty can and 5/5 resisted internal/external rotation. Negative apprehension. NV intact distally.  Right shoulder: FROM without pain.  Left hip: No gross deformity, swelling. Mild limitation ROM with pain on logroll both directions. TTP anterior hip to trochanter.  No posterior, other tenderness. 4/5 hip flexion, 5/5 with knee extension and flexion.    Assessment & Plan:  1. Left shoulder injury - Unfortunately also not improving as much as this has previously.  Her hip issue is more pressing currently (see below).  She will continue home exercises but plan to go ahead with MRI of this shoulder as well to assess for concurrent rotator cuff tear.  Voltaren as needed, heat, continue codman exercises.    2. Left hip pain - known DJD - independently reviewed recent radiographs and over the course of a year her arthritis has progressed - now severe with evidence of subchondral cyst formation.  Question of whether she has developing AVN.  Given lack of response to PT, injection for arthritis and severity of arthritis advised we go ahead with referral to ortho to discuss replacement surgery.

## 2016-12-06 NOTE — Assessment & Plan Note (Signed)
known DJD - independently reviewed recent radiographs and over the course of a year her arthritis has progressed - now severe with evidence of subchondral cyst formation.  Question of whether she has developing AVN.  Given lack of response to PT, injection for arthritis and severity of arthritis advised we go ahead with referral to ortho to discuss replacement surgery.

## 2016-12-06 NOTE — Assessment & Plan Note (Signed)
Unfortunately also not improving as much as this has previously.  Her hip issue is more pressing currently (see below).  She will continue home exercises but plan to go ahead with MRI of this shoulder as well to assess for concurrent rotator cuff tear.  Voltaren as needed, heat, continue codman exercises.

## 2016-12-07 ENCOUNTER — Other Ambulatory Visit: Payer: Self-pay | Admitting: Family Medicine

## 2016-12-07 DIAGNOSIS — E119 Type 2 diabetes mellitus without complications: Secondary | ICD-10-CM

## 2016-12-07 DIAGNOSIS — E785 Hyperlipidemia, unspecified: Secondary | ICD-10-CM

## 2016-12-08 ENCOUNTER — Encounter (INDEPENDENT_AMBULATORY_CARE_PROVIDER_SITE_OTHER): Payer: Self-pay | Admitting: Orthopaedic Surgery

## 2016-12-08 ENCOUNTER — Ambulatory Visit (INDEPENDENT_AMBULATORY_CARE_PROVIDER_SITE_OTHER): Payer: Federal, State, Local not specified - PPO | Admitting: Orthopaedic Surgery

## 2016-12-08 DIAGNOSIS — M1612 Unilateral primary osteoarthritis, left hip: Secondary | ICD-10-CM

## 2016-12-08 NOTE — Progress Notes (Addendum)
Office Visit Note   Patient: Rhonda Morrow           Date of Birth: 1961/10/30           MRN: [id number] Visit Date: 12/08/2016              Requested by: Ann Held, DO [address] [place], [place] [address] PCP: Ann Held, DO   Assessment & Plan: Visit Diagnoses:  1. Primary osteoarthritis of left hip     Plan:  Total face to face encounter time was greater than 45 minutes and over half of this time was spent in counseling and/or coordination of care. Through use of sign language interpreter, discussed surgical vs nonsurgical treatments and associated r/b/a.  She has failed extensive conservative treatment.  Patient understands this and wishes to proceed.  Denies h/o DVT.  Will schedule her in the near future. Anticipated OOW would be 6-12 weeks  Follow-Up Instructions: Return if symptoms worsen or fail to improve.   Orders:  No orders of the defined types were placed in this encounter.  No orders of the defined types were placed in this encounter.     Procedures: No procedures performed   Clinical Data: No additional findings.   Subjective: Chief Complaint  Patient presents with  . 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  Pain is deep in her left hip and groin region.  Causes her to limp.  She's tried po nsaids, intraarticular injections without relief.  Injection was around 10/25/16 with 2 weeks of relief.  Pain doesn't radiate.    Review of Systems  Constitutional: Negative.   HENT: Negative.   Eyes: Negative.   Respiratory: Negative.   Cardiovascular: Negative.   Endocrine: Negative.   Musculoskeletal: Negative.   Neurological: Negative.   Hematological: Negative.   Psychiatric/Behavioral: Negative.   All other systems reviewed and are negative.    Objective: Vital Signs: LMP 05/20/2012   Physical Exam  Constitutional: She is oriented to person, place,  and time. She appears well-developed and well-nourished.  HENT:  Head: Normocephalic and atraumatic.  Eyes: EOM are normal.  Neck: Neck supple.  Pulmonary/Chest: Effort normal.  Abdominal: Soft.  Neurological: She is alert and oriented to person, place, and time.  Skin: Skin is warm. Capillary refill takes less than 2 seconds.  Psychiatric: She has a normal mood and affect. Her behavior is normal. Judgment and thought content normal.  Nursing note and vitals reviewed.   Left Hip Exam   Tenderness  The patient is experiencing no tenderness.     Range of Motion  Internal Rotation: abnormal  External Rotation: abnormal   Muscle Strength  The patient has normal left hip strength.   Tests  FABER: negative  Other  Erythema: absent Sensation: normal Pulse: present  Comments:  +stinchfield test      Specialty Comments:  No specialty comments available.  Imaging: No results found.   PMFS History: Patient Active Problem List   Diagnosis Date Noted  . History of laparoscopic cholecystectomy 06/20/2016  . Cerumen impaction 06/09/2016  . Hypersomnia 05/16/2016  . Knee pain, right 05/05/2016  . RUQ pain 04/09/2016  . Abnormal CT scan 03/28/2016  . Dyspnea and respiratory abnormalities 03/15/2016  . Asthma with acute exacerbation 03/15/2016  . Asthma in adult 02/25/2016  . DM (diabetes mellitus) type II controlled, neurological manifestation ([place])  02/09/2016  . Left hip pain 12/23/2015  . Right hamstring muscle strain 11/18/2015  . Abdominal pain, acute 09/01/2015  . Acute asthma exacerbation 04/16/2015  . Acute bronchitis 04/14/2015  . Vaginal discharge 09/14/2014  . Pap smear for cervical cancer screening 09/14/2014  . Scabies 05/06/2014  . Bed bug bite 05/06/2014  . Allergic rhinitis 04/09/2014  . Rectal itching 04/09/2014  . Bladder spasm 04/09/2014  . Knee pain, left 03/05/2014  . Hypokalemia 02/19/2014  . Nausea with vomiting 02/19/2014  . Diabetes  mellitus, type II ([place]) 02/19/2014  . Edema 02/16/2014  . Disorder of rotator cuff 11/12/2013  . Routine general medical examination at a health care facility 08/20/2013  . GERD (gastroesophageal reflux disease) 06/26/2013  . Dysphagia, pharyngoesophageal phase 06/26/2013  . Suprapubic pain 05/12/2013  . Medication side effect 03/25/2013  . Diarrhea 03/25/2013  . Benign positional vertigo 01/30/2013  . Traumatic hematoma of thigh 01/15/2013  . HTN (hypertension) 09/02/2012  . Dry skin 08/22/2012  . External hemorrhoids 08/22/2012  . Obesity 06/30/2012  . Thyromegaly 05/22/2012  . Back pain 05/22/2012  . Elevated glucose 05/22/2012  . Moderate persistent asthma 04/19/2012  . Dyspnea 01/28/2012   Past Medical History:  Diagnosis Date  . Asthma   . Complication of anesthesia    one time woke up and was vey scared,17 yrs ago  . Deaf   . Diabetes mellitus without complication ([place])   . GERD (gastroesophageal reflux disease)   . Hypertension   . Left groin pain   . Thyroid disease     Family History  Problem Relation Age of Onset  . Diabetes Mother   . Heart disease Mother   . Diabetes Father     Past Surgical History:  Procedure Laterality Date  . CARDIAC CATHETERIZATION    . CESAREAN SECTION    . CHOLECYSTECTOMY N/A 06/20/2016   Procedure: LAPAROSCOPIC CHOLECYSTECTOMY;  Surgeon: Rolm Bookbinder, MD;  Location: [facility];  Service: General;  Laterality: N/A;  . ESOPHAGEAL MANOMETRY N/A 09/29/2013   Procedure: ESOPHAGEAL MANOMETRY (EM);  Surgeon: Milus Banister, MD;  Location: [facility];  Service: Endoscopy;  Laterality: N/A;   Social History   Occupational History  . diabled    Social History Main Topics  . Smoking status: Never Smoker  . Smokeless tobacco: Never Used  . Alcohol use No  . Drug use: No  . Sexual activity: No

## 2016-12-08 NOTE — Addendum Note (Signed)
Addended by: Azucena Cecil on: 12/08/2016 09:51 AM   Modules accepted: Level of Service

## 2016-12-11 ENCOUNTER — Ambulatory Visit: Payer: Self-pay | Admitting: Internal Medicine

## 2016-12-11 DIAGNOSIS — N951 Menopausal and female climacteric states: Secondary | ICD-10-CM | POA: Diagnosis not present

## 2016-12-11 DIAGNOSIS — Z6841 Body Mass Index (BMI) 40.0 and over, adult: Secondary | ICD-10-CM | POA: Diagnosis not present

## 2016-12-11 DIAGNOSIS — Z780 Asymptomatic menopausal state: Secondary | ICD-10-CM | POA: Diagnosis not present

## 2016-12-11 DIAGNOSIS — L639 Alopecia areata, unspecified: Secondary | ICD-10-CM | POA: Diagnosis not present

## 2016-12-11 DIAGNOSIS — N760 Acute vaginitis: Secondary | ICD-10-CM | POA: Diagnosis not present

## 2016-12-11 DIAGNOSIS — Z1231 Encounter for screening mammogram for malignant neoplasm of breast: Secondary | ICD-10-CM | POA: Diagnosis not present

## 2016-12-11 DIAGNOSIS — Z01419 Encounter for gynecological examination (general) (routine) without abnormal findings: Secondary | ICD-10-CM | POA: Diagnosis not present

## 2016-12-11 DIAGNOSIS — L659 Nonscarring hair loss, unspecified: Secondary | ICD-10-CM | POA: Diagnosis not present

## 2016-12-12 ENCOUNTER — Telehealth: Payer: Self-pay | Admitting: Medical

## 2016-12-12 ENCOUNTER — Encounter: Payer: Self-pay | Admitting: Internal Medicine

## 2016-12-12 ENCOUNTER — Ambulatory Visit (INDEPENDENT_AMBULATORY_CARE_PROVIDER_SITE_OTHER): Payer: Federal, State, Local not specified - PPO | Admitting: Internal Medicine

## 2016-12-12 VITALS — BP 108/76 | HR 68 | Ht 64.0 in | Wt 227.0 lb

## 2016-12-12 DIAGNOSIS — J4540 Moderate persistent asthma, uncomplicated: Secondary | ICD-10-CM | POA: Diagnosis not present

## 2016-12-12 NOTE — Patient Instructions (Addendum)
ICD-9-CM ICD-10-CM   1. Moderate persistent asthma, uncomplicated 123456 [id number]    Asthma is well controlled and stable  PLAN  - Continue Dulera 2 puff twice daily as before - Use albuterol as needed - ok for hip surgery as long as no acute asthma flare up  Followup 6-9 months or sooner if needed

## 2016-12-12 NOTE — Progress Notes (Signed)
Subjective:     Patient ID: Rhonda Morrow, female   DOB: [birth year], 56 y.o.   MRN: [id number]  HPI  female never smoker with known asthma Patient is deaf and mute  Test PFt date 01/24/12 shows mixed obstruction - restriction but greater restriction with low dlco  - fev1 1/48L/58%, Ratio 82, TLC 56, DLCO 14/51%, No BD response  CT chest 02/09/2012 showed no evidence of scarring   08/01/2016 Follow-up asthma Interpreter present for visit today. She returns for follow up . Says she is doing very . Best in long time  Is very appreciative today, very thankful to feel better.  Remains on Dulera Twice daily  .  No flare of cough or wheezing .  She denies chest pain, orthopnea, PND, or increased leg swelling.   Had cholecystectomy last month, did well post op.    OV 12/12/2016  Chief Complaint  Patient presents with  . Follow-up    Pt states her breathing has been the same since last OV. Pt states she was sick near the middle of Jan 2018. Pt denies cough and CP/tightness and f/c/s.      56 year old obese female with moderate persistent asthma. Presents for follow-up . Interpreter is Raphael Gibney. In terms of asthma she stable. Specifically on asthma control questionnaire she says she does not wake up in the middle of the night because of asthma. There are no symptoms when she wakes up in the morning. She is slightly limited in her activities because of dyspnea. There is no shortness of breath specifically related to asthma although it is due to obesity. She does wheeze a little of the time and does use albuterol for rescue one or 2 puffs in a week he did she is compliant with Dulera. She is up-to-date with her flu shot.   Past medical history: She says that Dr. Erlinda Hong will be doing a hip surgery on her  Immunization History  Administered Date(s) Administered  . DT 04/07/2011  . Influenza Split 08/07/2011, 08/05/2012, 08/22/2012, 08/31/2014  . Influenza,inj,Quad PF,36+ Mos 08/20/2013,  10/22/2015, 08/01/2016  . Pneumococcal Polysaccharide-23 10/02/2012  . Tdap 12/18/2011       has a past medical history of Asthma; Complication of anesthesia; Deaf; Diabetes mellitus without complication ([place]); GERD (gastroesophageal reflux disease); Hypertension; Left groin pain; and Thyroid disease.   reports that she has never smoked. She has never used smokeless tobacco.  Past Surgical History:  Procedure Laterality Date  . CARDIAC CATHETERIZATION    . CESAREAN SECTION    . CHOLECYSTECTOMY N/A 06/20/2016   Procedure: LAPAROSCOPIC CHOLECYSTECTOMY;  Surgeon: Rolm Bookbinder, MD;  Location: [facility];  Service: General;  Laterality: N/A;  . ESOPHAGEAL MANOMETRY N/A 09/29/2013   Procedure: ESOPHAGEAL MANOMETRY (EM);  Surgeon: Milus Banister, MD;  Location: [facility];  Service: Endoscopy;  Laterality: N/A;    Allergies  Allergen Reactions  . Losartan Shortness Of Breath  . Oxycodone-Acetaminophen Nausea And Vomiting  . Augmentin [Amoxicillin-Pot Clavulanate] Diarrhea    Immunization History  Administered Date(s) Administered  . DT 04/07/2011  . Influenza Split 08/07/2011, 08/05/2012, 08/22/2012, 08/31/2014  . Influenza,inj,Quad PF,36+ Mos 08/20/2013, 10/22/2015, 08/01/2016  . Pneumococcal Polysaccharide-23 10/02/2012  . Tdap 12/18/2011    Family History  Problem Relation Age of Onset  . Diabetes Mother   . Heart disease Mother   . Diabetes Father      Current Outpatient Prescriptions:  .  albuterol (PROVENTIL HFA;VENTOLIN HFA) 108 (90 Base) MCG/ACT inhaler, Inhale 2 puffs into the  lungs every 6 (six) hours as needed for wheezing., Disp: 1 Inhaler, Rfl: 3 .  amLODipine (NORVASC) 5 MG tablet, TAKE 1 TABLET (5 MG TOTAL) BY MOUTH DAILY., Disp: 90 tablet, Rfl: 1 .  azelastine (ASTELIN) 0.1 % nasal spray, Place 2 sprays into both nostrils 2 (two) times daily. Use in each nostril as directed, Disp: 30 mL, Rfl: 3 .  cetirizine (ZYRTEC) 10 MG tablet, Take 1 tablet (10 mg  total) by mouth daily., Disp: 30 tablet, Rfl: 11 .  diclofenac (VOLTAREN) 75 MG EC tablet, TAKE 1 TABLET (75 MG TOTAL) BY MOUTH 2 (TWO) TIMES DAILY., Disp: 60 tablet, Rfl: 1 .  dicyclomine (BENTYL) 20 MG tablet, Take 1 tablet (20 mg total) by mouth 2 (two) times daily., Disp: 20 tablet, Rfl: 0 .  Fiber CHEW, Chew 1 tablet by mouth daily., Disp: , Rfl:  .  fluticasone (FLONASE) 50 MCG/ACT nasal spray, PLACE 2 SPRAYS INTO BOTH NOSTRILS DAILY., Disp: 16 g, Rfl: 4 .  glucose blood test strip, Use as instructed--- one touch verio test strips, Disp: 100 each, Rfl: 12 .  hydrochlorothiazide (HYDRODIURIL) 12.5 MG tablet, Take 1 tablet (12.5 mg total) by mouth daily., Disp: 90 tablet, Rfl: 1 .  ipratropium-albuterol (DUONEB) 0.5-2.5 (3) MG/3ML SOLN, Take 3 mLs by nebulization every 6 (six) hours as needed., Disp: 360 mL, Rfl: 3 .  KLOR-CON M20 20 MEQ tablet, TAKE 1 TABLET (20 MEQ TOTAL) BY MOUTH DAILY., Disp: 90 tablet, Rfl: 1 .  meclizine (ANTIVERT) 25 MG tablet, Take 1 tablet (25 mg total) by mouth 3 (three) times daily as needed., Disp: 45 tablet, Rfl: 3 .  meloxicam (MOBIC) 15 MG tablet, 1/2-1 po qd prn pain, Disp: 30 tablet, Rfl: 2 .  metFORMIN (GLUCOPHAGE-XR) 500 MG 24 hr tablet, TAKE 1 TABLET (500 MG TOTAL) BY MOUTH DAILY WITH BREAKFAST., Disp: 90 tablet, Rfl: 1 .  methocarbamol (ROBAXIN) 500 MG tablet, Take 1 tablet (500 mg total) by mouth 2 (two) times daily., Disp: 20 tablet, Rfl: 0 .  mometasone-formoterol (DULERA) 100-5 MCG/ACT AERO, Inhale 2 puffs into the lungs 2 (two) times daily., Disp: 1 Inhaler, Rfl: 5 .  Multiple Vitamin (MULTIVITAMIN) tablet, Take 1 tablet by mouth daily., Disp: , Rfl:  .  NONFORMULARY OR COMPOUNDED ITEM, Nebulizer   DX ASTHMA, Disp: 1 each, Rfl: 0 .  omeprazole (PRILOSEC) 20 MG capsule, Take 1 capsule (20 mg total) by mouth daily., Disp: 30 capsule, Rfl: 2 .  ondansetron (ZOFRAN) 4 MG tablet, Take 1 tablet (4 mg total) by mouth every 8 (eight) hours as needed for nausea  or vomiting., Disp: 10 tablet, Rfl: 0 .  ranitidine (ZANTAC) 150 MG capsule, Take 1 capsule (150 mg total) by mouth 2 (two) times daily., Disp: 180 capsule, Rfl: 1 .  Spacer/Aero-Holding Chambers (AEROCHAMBER MV) inhaler, Use as instructed, Disp: 1 each, Rfl: 0 .  traMADol (ULTRAM) 50 MG tablet, 1 tab at night as needed  moderate to severe pain, Disp: 30 tablet, Rfl: 0 .  zolpidem (AMBIEN) 10 MG tablet, Take 1 tablet (10 mg total) by mouth at bedtime as needed for sleep., Disp: 15 tablet, Rfl: 1   Review of Systems     Objective:   Physical Exam  Constitutional: She is oriented to person, place, and time. She appears well-developed and well-nourished. No distress.  HENT:  Head: Normocephalic and atraumatic.  Right Ear: External ear normal.  Left Ear: External ear normal.  Mouth/Throat: Oropharynx is clear and moist. No oropharyngeal exudate.  Eyes: Conjunctivae and EOM are normal. Pupils are equal, round, and reactive to light. Right eye exhibits no discharge. Left eye exhibits no discharge. No scleral icterus.  Neck: Normal range of motion. Neck supple. No JVD present. No tracheal deviation present. No thyromegaly present.  Cardiovascular: Normal rate, regular rhythm, normal heart sounds and intact distal pulses.  Exam reveals no gallop and no friction rub.   No murmur heard. Pulmonary/Chest: Effort normal and breath sounds normal. No respiratory distress. She has no wheezes. She has no rales. She exhibits no tenderness.  Abdominal: Soft. Bowel sounds are normal. She exhibits no distension and no mass. There is no tenderness. There is no rebound and no guarding.  Musculoskeletal: Normal range of motion. She exhibits no edema or tenderness.  Lymphadenopathy:    She has no cervical adenopathy.  Neurological: She is alert and oriented to person, place, and time. She has normal reflexes. No cranial nerve deficit. She exhibits normal muscle tone. Coordination normal.  Skin: Skin is warm and  dry. No rash noted. She is not diaphoretic. No erythema. No pallor.  Psychiatric: She has a normal mood and affect. Her behavior is normal. Judgment and thought content normal.  Vitals reviewed.  Vitals:   12/12/16 1505  BP: 108/76  Pulse: 68  SpO2: 98%  Weight: 227 lb (103 kg)  Height: 5\' 4"  (1.626 m)   Body mass index is 38.96 kg/m.     Assessment:       ICD-9-CM ICD-10-CM   1. Moderate persistent asthma, uncomplicated 123456 [id number]        Plan:      Asthma is well controlled and stable  PLAN  - Continue Dulera 2 puff twice daily as before - Use albuterol as needed - ok for hip surgery as long as no acute asthma flare up  Followup 6-9 months or sooner if needed   Dr. Brand Males, M.D., [facility].C.P Pulmonary and Critical Care Medicine Staff Physician [facility] Pager: [phone], If no answer or between  15:00h - 7:00h: call [phone]  12/12/2016 3:43 PM

## 2016-12-12 NOTE — Telephone Encounter (Signed)
Will you see sheet placed on your computer keyboard. Will you show this to Dr. Etter Sjogren. Pt last saw Dr. Etter Sjogren and I think they referred her for evaluation. But some reason the referal did not go through?

## 2016-12-13 ENCOUNTER — Ambulatory Visit (INDEPENDENT_AMBULATORY_CARE_PROVIDER_SITE_OTHER): Payer: Federal, State, Local not specified - PPO | Admitting: Behavioral Health

## 2016-12-13 DIAGNOSIS — E538 Deficiency of other specified B group vitamins: Secondary | ICD-10-CM

## 2016-12-13 MED ORDER — CYANOCOBALAMIN 1000 MCG/ML IJ SOLN
1000.0000 ug | Freq: Once | INTRAMUSCULAR | Status: AC
  Administered 2016-12-13: 1000 ug via INTRAMUSCULAR

## 2016-12-13 NOTE — Progress Notes (Addendum)
Pre visit review using our clinic review tool, if applicable. No additional management support is needed unless otherwise documented below in the visit note.  Patient came in clinic today for B12 injection. IM given in Left Deltoid. Patient tolerated injection well.  Next appointment 12/20/16 at 9:45 AM.  If reviewed Rn note and diagnosis. Agree with b-12 im injection treatment.  Saguier, Percell Miller, PA-C

## 2016-12-18 ENCOUNTER — Telehealth: Payer: Self-pay | Admitting: Family Medicine

## 2016-12-18 ENCOUNTER — Ambulatory Visit
Admission: RE | Admit: 2016-12-18 | Discharge: 2016-12-18 | Disposition: A | Discharge status: Home / Self Care | Payer: Federal, State, Local not specified - PPO | Source: Ambulatory Visit | Attending: Family Medicine | Admitting: Family Medicine

## 2016-12-18 DIAGNOSIS — M67912 Unspecified disorder of synovium and tendon, left shoulder: Secondary | ICD-10-CM

## 2016-12-19 NOTE — Addendum Note (Signed)
Addended by: Sherrie George F on: 12/19/2016 10:29 AM   Modules accepted: Orders

## 2016-12-20 ENCOUNTER — Ambulatory Visit (INDEPENDENT_AMBULATORY_CARE_PROVIDER_SITE_OTHER): Payer: Federal, State, Local not specified - PPO

## 2016-12-20 DIAGNOSIS — E538 Deficiency of other specified B group vitamins: Secondary | ICD-10-CM | POA: Diagnosis not present

## 2016-12-20 MED ORDER — CYANOCOBALAMIN 1000 MCG/ML IJ SOLN
1000.0000 ug | Freq: Once | INTRAMUSCULAR | Status: AC
  Administered 2016-12-20: 1000 ug via INTRAMUSCULAR

## 2016-12-20 NOTE — Progress Notes (Signed)
Pre visit review using our clinic tool,if applicable. No additional management support is needed unless otherwise documented below in the visit note.   Patient in for B12 injection per order from Dr. Etter Sjogren due to patient B12 difeciency.  Given IM right deltoid. Patient tolerated well. Return appointment given for 1 week.

## 2016-12-21 NOTE — Telephone Encounter (Signed)
MRI scheduled.

## 2016-12-23 ENCOUNTER — Encounter (HOSPITAL_BASED_OUTPATIENT_CLINIC_OR_DEPARTMENT_OTHER): Payer: Self-pay

## 2016-12-23 ENCOUNTER — Ambulatory Visit (HOSPITAL_BASED_OUTPATIENT_CLINIC_OR_DEPARTMENT_OTHER)
Admission: RE | Admit: 2016-12-23 | Discharge: 2016-12-23 | Disposition: A | Discharge status: Home / Self Care | Payer: Federal, State, Local not specified - PPO | Source: Ambulatory Visit | Attending: Family Medicine | Admitting: Family Medicine

## 2016-12-23 DIAGNOSIS — M67912 Unspecified disorder of synovium and tendon, left shoulder: Secondary | ICD-10-CM

## 2016-12-27 ENCOUNTER — Ambulatory Visit (INDEPENDENT_AMBULATORY_CARE_PROVIDER_SITE_OTHER): Payer: Federal, State, Local not specified - PPO

## 2016-12-27 ENCOUNTER — Other Ambulatory Visit: Payer: Self-pay | Admitting: Family Medicine

## 2016-12-27 DIAGNOSIS — E538 Deficiency of other specified B group vitamins: Secondary | ICD-10-CM | POA: Diagnosis not present

## 2016-12-27 DIAGNOSIS — M25512 Pain in left shoulder: Secondary | ICD-10-CM

## 2016-12-27 MED ORDER — DIAZEPAM 5 MG PO TABS: ORAL_TABLET | ORAL | 0 refills | Status: DC

## 2016-12-27 MED ORDER — CYANOCOBALAMIN 1000 MCG/ML IJ SOLN
1000.0000 ug | Freq: Once | INTRAMUSCULAR | Status: AC
  Administered 2016-12-27: 1000 ug via INTRAMUSCULAR

## 2016-12-27 MED FILL — diazePAM 5 MG TABS: 5 | 2 days supply | Qty: 2 | Fill #0

## 2016-12-27 NOTE — Addendum Note (Signed)
Addended by: Dene Gentry on: 12/27/2016 10:16 AM   Modules accepted: Orders

## 2016-12-27 NOTE — Telephone Encounter (Signed)
Received a request for Tramadol 50mg  (take 1 tab po qhs prn)  Last Rf: 11/08/2016 Last Ov: 12/04/2016 Next Ov: 06/04/2017 UDS: 11/07/16 controlled substance contract signed, no uds sample given.  Forwarded to the Provider for review, denial or approval.

## 2016-12-27 NOTE — Progress Notes (Signed)
Pre visit review using our clinic tool,if applicable. No additional management support is needed unless otherwise documented below in the visit note.   Patient in today for B12 injection due to B12 deficiency per order from Dr. [name] dated 12/04/16. .  Given IM Left deltoid. Patient tolerated well. Return appointment given for 1 week.

## 2016-12-28 NOTE — Telephone Encounter (Addendum)
Relation to [id number] Call back [phone]  Reason for call:  Patient requesting a refill traMADol (ULTRAM) 50 MG tablet

## 2016-12-29 ENCOUNTER — Encounter: Payer: Self-pay | Admitting: Medical

## 2016-12-29 ENCOUNTER — Ambulatory Visit (INDEPENDENT_AMBULATORY_CARE_PROVIDER_SITE_OTHER): Payer: Federal, State, Local not specified - PPO | Admitting: Medical

## 2016-12-29 VITALS — BP 112/74 | HR 88 | Temp 97.9°F | Resp 16 | Ht 64.0 in | Wt 227.4 lb

## 2016-12-29 DIAGNOSIS — R399 Unspecified symptoms and signs involving the genitourinary system: Secondary | ICD-10-CM | POA: Diagnosis not present

## 2016-12-29 DIAGNOSIS — M25552 Pain in left hip: Secondary | ICD-10-CM | POA: Diagnosis not present

## 2016-12-29 DIAGNOSIS — M25512 Pain in left shoulder: Secondary | ICD-10-CM | POA: Diagnosis not present

## 2016-12-29 LAB — POC URINALSYSI DIPSTICK (AUTOMATED)
BILIRUBIN UA: NEGATIVE
Blood, UA: NEGATIVE
GLUCOSE UA: NEGATIVE
Ketones, UA: NEGATIVE
LEUKOCYTES UA: NEGATIVE
NITRITE UA: NEGATIVE
Protein, UA: POSITIVE
Spec Grav, UA: 1.025
UROBILINOGEN UA: 2
pH, UA: 6.5

## 2016-12-29 MED ORDER — MONTELUKAST SODIUM 10 MG PO TABS: 10.0000 mg | ORAL_TABLET | Freq: Every day | ORAL | 1 refills | Status: DC

## 2016-12-29 MED ORDER — CIPROFLOXACIN HCL 250 MG PO TABS: 250.0000 mg | ORAL_TABLET | Freq: Two times a day (BID) | ORAL | 0 refills | Status: DC

## 2016-12-29 MED ORDER — TRAMADOL HCL 50 MG PO TABS: 50.0000 mg | ORAL_TABLET | Freq: Every evening | ORAL | 0 refills | Status: DC | PRN

## 2016-12-29 NOTE — Patient Instructions (Addendum)
You appear to have a urinary tract infection. I am prescribing cipro antibiotic for the probable infection. Hydrate well. I am sending out a urine culture. During the interim if your signs and symptoms worsen rather than improving please notify us. We will notify your when the culture results are back.  For mild runny nose and allergy symptoms. Continue current allergy meds. But if symptoms worsen then can start montelukast.   For hip pain refilled tramadol  Follow up in 7 days or as needed.

## 2016-12-29 NOTE — Progress Notes (Signed)
Subjective:    Patient ID: Rhonda Morrow, female    DOB: 01-12-1961, 56 y.o.   MRN: [id number]  HPI  Pt in with some left cva area pain for 2 weeks. Pt has some urgency and some mild pressure before she urinates. She has to go to urinate more frequently. No fever, no chills or sweats. No nausea or vomiting.  Pt also needs tramadol for left hip area pain. She needs refill. Pt has seen orthopedist. They gave her meloxicam.(this helps and is on 15 mg a day). Pt states orthopedist has told her she will need hip replacement.  One week mild runny nose and itching eyes. Hx of allergies.  Review of Systems  Constitutional: Negative for chills, fatigue and fever.  HENT: Positive for rhinorrhea. Negative for congestion, dental problem, ear pain, sore throat, tinnitus and trouble swallowing.   Respiratory: Negative for cough, chest tightness, shortness of breath and wheezing.   Cardiovascular: Negative for chest pain and palpitations.  Gastrointestinal: Negative for abdominal pain.  Genitourinary: Positive for frequency and urgency. Negative for difficulty urinating, flank pain, hematuria and vaginal pain.  Musculoskeletal: Negative for back pain.  Skin: Negative for rash.  Neurological: Negative for dizziness, seizures, syncope, weakness and headaches.  Hematological: Negative for adenopathy. Does not bruise/bleed easily.  Psychiatric/Behavioral: Negative for behavioral problems, confusion and sleep disturbance. The patient is not nervous/anxious.     Past Medical History:  Diagnosis Date  . Asthma   . Complication of anesthesia    one time woke up and was vey scared,17 yrs ago  . Deaf   . Diabetes mellitus without complication ([place])   . GERD (gastroesophageal reflux disease)   . Hypertension   . Left groin pain   . Thyroid disease      Social History   Social History  . Marital status: Married    Spouse name: N/A  . Number of children: N/A  . Years of education: N/A    Occupational History  . diabled    Social History Main Topics  . Smoking status: Never Smoker  . Smokeless tobacco: Never Used  . Alcohol use No  . Drug use: No  . Sexual activity: No   Other Topics Concern  . Not on file   Social History Narrative  . No narrative on file    Past Surgical History:  Procedure Laterality Date  . CARDIAC CATHETERIZATION    . CESAREAN SECTION    . CHOLECYSTECTOMY N/A 06/20/2016   Procedure: LAPAROSCOPIC CHOLECYSTECTOMY;  Surgeon: Rolm Bookbinder, MD;  Location: [facility];  Service: General;  Laterality: N/A;  . ESOPHAGEAL MANOMETRY N/A 09/29/2013   Procedure: ESOPHAGEAL MANOMETRY (EM);  Surgeon: Milus Banister, MD;  Location: [facility];  Service: Endoscopy;  Laterality: N/A;    Family History  Problem Relation Age of Onset  . Diabetes Mother   . Heart disease Mother   . Diabetes Father     Allergies  Allergen Reactions  . Losartan Shortness Of Breath  . Oxycodone-Acetaminophen Nausea And Vomiting  . Augmentin [Amoxicillin-Pot Clavulanate] Diarrhea    Current Outpatient Prescriptions on File Prior to Visit  Medication Sig Dispense Refill  . albuterol (PROVENTIL HFA;VENTOLIN HFA) 108 (90 Base) MCG/ACT inhaler Inhale 2 puffs into the lungs every 6 (six) hours as needed for wheezing. 1 Inhaler 3  . amLODipine (NORVASC) 5 MG tablet TAKE 1 TABLET (5 MG TOTAL) BY MOUTH DAILY. 90 tablet 1  . azelastine (ASTELIN) 0.1 % nasal spray Place 2 sprays into  both nostrils 2 (two) times daily. Use in each nostril as directed 30 mL 3  . cetirizine (ZYRTEC) 10 MG tablet Take 1 tablet (10 mg total) by mouth daily. 30 tablet 11  . diazepam (VALIUM) 5 MG tablet Take 1 tablet 30-45 minutes prior to procedure.  May repeat x1. 2 tablet 0  . diclofenac (VOLTAREN) 75 MG EC tablet TAKE 1 TABLET (75 MG TOTAL) BY MOUTH 2 (TWO) TIMES DAILY. 60 tablet 1  . dicyclomine (BENTYL) 20 MG tablet Take 1 tablet (20 mg total) by mouth 2 (two) times daily. 20 tablet 0  .  Fiber CHEW Chew 1 tablet by mouth daily.    . fluticasone (FLONASE) 50 MCG/ACT nasal spray PLACE 2 SPRAYS INTO BOTH NOSTRILS DAILY. 16 g 4  . glucose blood test strip Use as instructed--- one touch verio test strips 100 each 12  . hydrochlorothiazide (HYDRODIURIL) 12.5 MG tablet Take 1 tablet (12.5 mg total) by mouth daily. 90 tablet 1  . ipratropium-albuterol (DUONEB) 0.5-2.5 (3) MG/3ML SOLN Take 3 mLs by nebulization every 6 (six) hours as needed. 360 mL 3  . KLOR-CON M20 20 MEQ tablet TAKE 1 TABLET (20 MEQ TOTAL) BY MOUTH DAILY. 90 tablet 1  . meclizine (ANTIVERT) 25 MG tablet Take 1 tablet (25 mg total) by mouth 3 (three) times daily as needed. 45 tablet 3  . meloxicam (MOBIC) 15 MG tablet 1/2-1 po qd prn pain 30 tablet 2  . metFORMIN (GLUCOPHAGE-XR) 500 MG 24 hr tablet TAKE 1 TABLET (500 MG TOTAL) BY MOUTH DAILY WITH BREAKFAST. 90 tablet 1  . methocarbamol (ROBAXIN) 500 MG tablet Take 1 tablet (500 mg total) by mouth 2 (two) times daily. 20 tablet 0  . mometasone-formoterol (DULERA) 100-5 MCG/ACT AERO Inhale 2 puffs into the lungs 2 (two) times daily. 1 Inhaler 5  . Multiple Vitamin (MULTIVITAMIN) tablet Take 1 tablet by mouth daily.    . NONFORMULARY OR COMPOUNDED ITEM Nebulizer   DX ASTHMA 1 each 0  . omeprazole (PRILOSEC) 20 MG capsule Take 1 capsule (20 mg total) by mouth daily. 30 capsule 2  . ondansetron (ZOFRAN) 4 MG tablet Take 1 tablet (4 mg total) by mouth every 8 (eight) hours as needed for nausea or vomiting. 10 tablet 0  . ranitidine (ZANTAC) 150 MG capsule Take 1 capsule (150 mg total) by mouth 2 (two) times daily. 180 capsule 1  . Spacer/Aero-Holding Chambers (AEROCHAMBER MV) inhaler Use as instructed 1 each 0  . zolpidem (AMBIEN) 10 MG tablet Take 1 tablet (10 mg total) by mouth at bedtime as needed for sleep. 15 tablet 1   No current facility-administered medications on file prior to visit.     BP 112/74 (BP Location: Left Arm, Patient Position: Sitting, Cuff Size: Large)    Pulse 88   Temp 97.9 F (36.6 C) (Oral)   Resp 16   Ht 5\' 4"  (1.626 m)   Wt 227 lb 6 oz (103.1 kg)   LMP 05/20/2012   SpO2 98%   BMI 39.03 kg/m       Objective:   Physical Exam  General Appearance- Not in acute distress.  HEENT Eyes- Scleraeral/Conjuntiva-bilat- Not Yellow. Mouth & Throat- Normal. No sinus pressure but boggy turbinates and some pnd.  Chest and Lung Exam Auscultation: Breath sounds:-Normal. Adventitious sounds:- No Adventitious sounds.  Cardiovascular Auscultation:Rythm - Regular. Heart Sounds -Normal heart sounds.  Abdomen Inspection:-Inspection Normal.  Palpation/Perucssion: Palpation and Percussion of the abdomen reveal- Non Tender, No Rebound tenderness, No rigidity(Guarding)  and No Palpable abdominal masses.  Liver:-Normal.  Spleen:- Normal.   Back- no cva tenderness.  Lt hip- pain on range of motion.     Assessment & Plan:  You appear to have a urinary tract infection. I am prescribing cipro antibiotic for the probable infection. Hydrate well. I am sending out a urine culture. During the interim if your signs and symptoms worsen rather than improving please notify us. We will notify your when the culture results are back.  For mild runny nose and allergy symptoms. Continue current allergy meds. But if symptoms worsen then can start montelukast.   For hip pain refilled tramadol  Follow up in 7 days or as needed.  Ileana Chalupa, Percell Miller, PA-C

## 2016-12-29 NOTE — Telephone Encounter (Signed)
Refill done by Lendon Collar

## 2016-12-29 NOTE — Progress Notes (Signed)
Pre visit review using our clinic review tool, if applicable. No additional management support is needed unless otherwise documented below in the visit note/SLS  

## 2016-12-30 ENCOUNTER — Ambulatory Visit (HOSPITAL_BASED_OUTPATIENT_CLINIC_OR_DEPARTMENT_OTHER): Payer: Federal, State, Local not specified - PPO

## 2017-01-01 ENCOUNTER — Telehealth: Payer: Self-pay | Admitting: Medical

## 2017-01-01 LAB — CULTURE, URINE COMPREHENSIVE

## 2017-01-01 MED ORDER — CIPROFLOXACIN HCL 500 MG PO TABS: 500.0000 mg | ORAL_TABLET | Freq: Two times a day (BID) | ORAL | 0 refills | Status: DC

## 2017-01-01 NOTE — Telephone Encounter (Signed)
Copy & Paste in to open results note.02/26

## 2017-01-01 NOTE — Telephone Encounter (Signed)
Actually checked and I gave cipro low dose penidng culture result. So I sent in another 4 days of cipro at higher dose. Do recommend that she take this in addition to the prior 3 day lower dose cipro rx.

## 2017-01-02 ENCOUNTER — Telehealth: Payer: Self-pay | Admitting: Medical

## 2017-01-02 NOTE — Telephone Encounter (Addendum)
At end of pt last visit she mentioned that she had fmla form for me to fill out. I obviously did not have time on day of visit. Also she gave me fmla form for her husband. I thought I could maybe fill out forms but I think they both  Need visits. Husband name is Elideth Ramlall.   I saw her a week ago and unclear if she needed fmla for intermittently hip pain or to help out with her husbands medical problems.   I have not seen her husband recenlty and he has a lot of medical problems. So I not sure how to fill out form and for which diagnosis.  I would like them scheduled for an hour with there interpretor. 30 minute appointment slots for [place] and bernard.   I apologize for indicating I thought I could fill out but need their help in office. Hopefully both are doing well since acute visit on top of filling out forms may not allow time. Want to prioritize forms.   Maybe could schedule for Thursday from 1:30-2:30. If they area in agreement Go ahead and schedule. Otherwise might have to relay questions through interpretor.

## 2017-01-03 ENCOUNTER — Ambulatory Visit (INDEPENDENT_AMBULATORY_CARE_PROVIDER_SITE_OTHER): Payer: Federal, State, Local not specified - PPO | Admitting: Behavioral Health

## 2017-01-03 DIAGNOSIS — E538 Deficiency of other specified B group vitamins: Secondary | ICD-10-CM | POA: Diagnosis not present

## 2017-01-03 MED ORDER — CYANOCOBALAMIN 1000 MCG/ML IJ SOLN
1000.0000 ug | Freq: Once | INTRAMUSCULAR | Status: AC
  Administered 2017-01-03: 1000 ug via INTRAMUSCULAR

## 2017-01-03 NOTE — Telephone Encounter (Signed)
Both Rhonda Morrow & Rhonda Morrow] are Dr. Carollee Herter patients with Chronic medical problems that PCP has managed with Rhonda Morrow on an ongoing basis; would you want to defer Rhonda Morrow to PCP, and address Rhonda Morrow's, since she has been seen by you more often than PCP/SLS 02/28

## 2017-01-03 NOTE — Progress Notes (Addendum)
Pre visit review using our clinic review tool, if applicable. No additional management support is needed unless otherwise documented below in the visit note.  Patient came in clinic for B12 injection #4. IM given in Left Deltoid. Patient tolerated injection well.  Next appointment 01/30/17 at 2:30 PM.  Reviewed note of RN and agree with administration of med.  Saguier, Percell Miller, PA-C

## 2017-01-04 ENCOUNTER — Telehealth: Payer: Self-pay | Admitting: Medical

## 2017-01-04 NOTE — Telephone Encounter (Signed)
Would you try again tomorrow and let me know if you got through to her?

## 2017-01-04 NOTE — Telephone Encounter (Signed)
Called patient.  Husband picked up.  He stated he's not sure what the paperwork is for.  He said he would relay message to wife and have call us back.

## 2017-01-05 NOTE — Telephone Encounter (Signed)
Patient called back.  2 forms were submitted.  Pt states one form is for her personally.  She has respiratory issues and left hip pain and needs paperwork filled out for these conditions.  The second form is for her taking care of her husband.  She said she's off on Monday and Tuesday, and agreed to come in on Tuesday at 9:30 am.

## 2017-01-05 NOTE — Telephone Encounter (Signed)
Called patient again. Husband picked up phone again and stated that patient was at work. He said she is aware that we've called and that she will call us once she gets off work.

## 2017-01-08 ENCOUNTER — Encounter: Payer: Self-pay | Admitting: Neurology

## 2017-01-08 ENCOUNTER — Ambulatory Visit (INDEPENDENT_AMBULATORY_CARE_PROVIDER_SITE_OTHER): Payer: Federal, State, Local not specified - PPO | Admitting: Neurology

## 2017-01-08 VITALS — BP 110/70 | HR 80 | Ht 64.0 in | Wt 226.5 lb

## 2017-01-08 DIAGNOSIS — R29818 Other symptoms and signs involving the nervous system: Secondary | ICD-10-CM | POA: Diagnosis not present

## 2017-01-08 DIAGNOSIS — R42 Dizziness and giddiness: Secondary | ICD-10-CM | POA: Diagnosis not present

## 2017-01-08 DIAGNOSIS — R292 Abnormal reflex: Secondary | ICD-10-CM | POA: Diagnosis not present

## 2017-01-08 DIAGNOSIS — R519 Headache, unspecified: Secondary | ICD-10-CM

## 2017-01-08 DIAGNOSIS — R51 Headache: Secondary | ICD-10-CM

## 2017-01-08 MED ORDER — CYCLOBENZAPRINE HCL 5 MG PO TABS: 5.0000 mg | ORAL_TABLET | Freq: Every evening | ORAL | 5 refills | Status: DC | PRN

## 2017-01-08 NOTE — Patient Instructions (Addendum)
1.  US carotids 2.  MRI brain and cervical spine without contrast 3.  Start flexeril 5mg  at bedtime as needed for neck pain 4.  Start drinking plenty of water  Return to clinic in 3 months

## 2017-01-08 NOTE — Progress Notes (Signed)
[facility] Clinic Note - Initial Visit   Date: 01/08/17  Rhonda Morrow MRN: [id number] DOB: 1960/12/05   Dear Dr Carollee Herter:  Thank you for your kind referral of Rhonda Morrow for consultation of headaches and dizziness. Although her history is well known to you, please allow [place] to reiterate it for the purpose of our medical record. The patient was accompanied to the clinic by sign language interpreter who also provides collateral information.     History of Present Illness: Rhonda Morrow is a 56 y.o. right-handed African American female with diabetes mellitus, hypertension, GERD, and asthma presenting for evaluation of headaches and dizziness.  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   Starting around June 2017, she began having dizzy spells described has spinning with positional changes which is worse with aburpt head movements.  Symptoms last about 5 seconds, occurring about twice per day.  Symptoms are sporadic.  It is always triggered by bending her head, such as looking down, so is very careful with this. She also has severe shooting pain down her back with neck flexion. There is no nausea or vomiting.  She has been on meclizine, but does not recall whether it helps or not.    She also complains of bifrontal headaches, described as pulsating and pounding pain which last 5 seconds and are improved with resting.  Headaches are not worse with coughing, laughing, or sneezing.  Headaches are not severe enough for her to take any medications, but she is worried because she had not had headaches before.  CT head in December 2017 was normal.  Out-side paper records, electronic medical record, and images have been reviewed where available and summarized as:  Lab Results  Component Value Date   TSH 0.87 12/04/2016   Lab Results  Component Value Date   HGBA1C 6.6 (H) 12/04/2016   Lab Results  Component Value Date   VITAMINB12 285 12/04/2016     Past  Medical History:  Diagnosis Date  . Asthma   . Complication of anesthesia    one time woke up and was vey scared,17 yrs ago  . Deaf   . Diabetes mellitus without complication ([place])   . GERD (gastroesophageal reflux disease)   . Hypertension   . Left groin pain   . Thyroid disease     Past Surgical History:  Procedure Laterality Date  . CARDIAC CATHETERIZATION    . CESAREAN SECTION    . CHOLECYSTECTOMY N/A 06/20/2016   Procedure: LAPAROSCOPIC CHOLECYSTECTOMY;  Surgeon: Rolm Bookbinder, MD;  Location: [facility];  Service: General;  Laterality: N/A;  . ESOPHAGEAL MANOMETRY N/A 09/29/2013   Procedure: ESOPHAGEAL MANOMETRY (EM);  Surgeon: Milus Banister, MD;  Location: [facility];  Service: Endoscopy;  Laterality: N/A;     Medications:  Outpatient Encounter Prescriptions as of 01/08/2017  Medication Sig  . albuterol (PROVENTIL HFA;VENTOLIN HFA) 108 (90 Base) MCG/ACT inhaler Inhale 2 puffs into the lungs every 6 (six) hours as needed for wheezing.  Marland Kitchen amLODipine (NORVASC) 5 MG tablet TAKE 1 TABLET (5 MG TOTAL) BY MOUTH DAILY.  Marland Kitchen azelastine (ASTELIN) 0.1 % nasal spray Place 2 sprays into both nostrils 2 (two) times daily. Use in each nostril as directed  . cetirizine (ZYRTEC) 10 MG tablet Take 1 tablet (10 mg total) by mouth daily.  . ciprofloxacin (CIPRO) 250 MG tablet Take 1 tablet (250 mg total) by mouth 2 (two) times daily.  . ciprofloxacin (CIPRO) 500 MG tablet Take 1 tablet (500  mg total) by mouth 2 (two) times daily.  . diazepam (VALIUM) 5 MG tablet Take 1 tablet 30-45 minutes prior to procedure.  May repeat x1.  . diclofenac (VOLTAREN) 75 MG EC tablet TAKE 1 TABLET (75 MG TOTAL) BY MOUTH 2 (TWO) TIMES DAILY.  Marland Kitchen dicyclomine (BENTYL) 20 MG tablet Take 1 tablet (20 mg total) by mouth 2 (two) times daily.  . Fiber CHEW Chew 1 tablet by mouth daily.  . fluticasone (FLONASE) 50 MCG/ACT nasal spray PLACE 2 SPRAYS INTO BOTH NOSTRILS DAILY.  Marland Kitchen glucose blood test strip Use as instructed---  one touch verio test strips  . hydrochlorothiazide (HYDRODIURIL) 12.5 MG tablet Take 1 tablet (12.5 mg total) by mouth daily.  Marland Kitchen ipratropium-albuterol (DUONEB) 0.5-2.5 (3) MG/3ML SOLN Take 3 mLs by nebulization every 6 (six) hours as needed.  Marland Kitchen KLOR-CON M20 20 MEQ tablet TAKE 1 TABLET (20 MEQ TOTAL) BY MOUTH DAILY.  . meclizine (ANTIVERT) 25 MG tablet Take 1 tablet (25 mg total) by mouth 3 (three) times daily as needed.  . meloxicam (MOBIC) 15 MG tablet 1/2-1 po qd prn pain  . metFORMIN (GLUCOPHAGE-XR) 500 MG 24 hr tablet TAKE 1 TABLET (500 MG TOTAL) BY MOUTH DAILY WITH BREAKFAST.  . mometasone-formoterol (DULERA) 100-5 MCG/ACT AERO Inhale 2 puffs into the lungs 2 (two) times daily.  . montelukast (SINGULAIR) 10 MG tablet Take 1 tablet (10 mg total) by mouth at bedtime.  . Multiple Vitamin (MULTIVITAMIN) tablet Take 1 tablet by mouth daily.  . NONFORMULARY OR COMPOUNDED ITEM Nebulizer   DX ASTHMA  . omeprazole (PRILOSEC) 20 MG capsule Take 1 capsule (20 mg total) by mouth daily.  . ondansetron (ZOFRAN) 4 MG tablet Take 1 tablet (4 mg total) by mouth every 8 (eight) hours as needed for nausea or vomiting.  . ranitidine (ZANTAC) 150 MG capsule Take 1 capsule (150 mg total) by mouth 2 (two) times daily.  Marland Kitchen Spacer/Aero-Holding Chambers (AEROCHAMBER MV) inhaler Use as instructed  . traMADol (ULTRAM) 50 MG tablet Take 1 tablet (50 mg total) by mouth at bedtime as needed.  . zolpidem (AMBIEN) 10 MG tablet Take 1 tablet (10 mg total) by mouth at bedtime as needed for sleep.  . [DISCONTINUED] methocarbamol (ROBAXIN) 500 MG tablet Take 1 tablet (500 mg total) by mouth 2 (two) times daily.  . cyclobenzaprine (FLEXERIL) 5 MG tablet Take 1 tablet (5 mg total) by mouth at bedtime as needed for muscle spasms.   No facility-administered encounter medications on file as of 01/08/2017.      Allergies:  Allergies  Allergen Reactions  . Losartan Shortness Of Breath  . Oxycodone-Acetaminophen Nausea And  Vomiting  . Augmentin [Amoxicillin-Pot Clavulanate] Diarrhea    Family History: Family History  Problem Relation Age of Onset  . Diabetes Mother   . Heart disease Mother   . Diabetes Father     Social History: Social History  Substance Use Topics  . Smoking status: Never Smoker  . Smokeless tobacco: Never Used  . Alcohol use No   Social History   Social History Narrative   Lives with family.  Has 3 children.  Works for [employer].  Education: high school.    Review of Systems:  CONSTITUTIONAL: No fevers, chills, night sweats, or weight loss.   EYES: No visual changes or eye pain ENT: +deaf .  No history of nose bleeds.   RESPIRATORY: No cough, wheezing and shortness of breath.   CARDIOVASCULAR: Negative for chest pain, and palpitations.   GI:  Negative for abdominal discomfort, blood in stools or black stools.  No recent change in bowel habits.   GU:  No history of incontinence.   MUSCLOSKELETAL: No history of joint pain or swelling.  No myalgias.   SKIN: Negative for lesions, rash, and itching.   HEMATOLOGY/ONCOLOGY: Negative for prolonged bleeding, bruising easily, and swollen nodes.  No history of cancer.   ENDOCRINE: Negative for cold or heat intolerance, polydipsia or goiter.   PSYCH:  No depression or anxiety symptoms.   NEURO: As Above.   Vital Signs:  BP 110/70   Pulse 80   Ht 5\' 4"  (1.626 m)   Wt 226 lb 8 oz (102.7 kg)   LMP 05/20/2012   SpO2 98%   BMI 38.88 kg/m    General Medical Exam:   General:  Well appearing, comfortable, obese.   Eyes/ENT: see cranial nerve examination.   Neck: No masses appreciated.  Full range of motion without tenderness.  No carotid bruits.  Lhermitte's sign positive. Respiratory:  Clear to auscultation, good air entry bilaterally.   Cardiac:  Regular rate and rhythm, no murmur.   Extremities:  No deformities, edema, or skin discoloration.  Skin:  No rashes or lesions.  Neurological Exam: MENTAL STATUS including  orientation to time, place, person, recent and remote memory, attention span and concentration, language, and fund of knowledge is normal.  She is mute.  CRANIAL NERVES: II:  No visual field defects.  Unremarkable fundi.   III-IV-VI: Pupils equal round and reactive to light.  Normal conjugate, extra-ocular eye movements in all directions of gaze.  No nystagmus.  No ptosis.   V:  Normal facial sensation.  Jaw jerk is absent.   VII:  Normal facial symmetry and movements.  No pathologic facial reflexes.  VIII:  Legally deaf.  Normal vestibular function.   IX-X:  Normal palatal movement.   XI:  Normal shoulder shrug and head rotation.   XII:  Normal tongue strength and range of motion, no deviation or fasciculation.  MOTOR:  No atrophy, fasciculations or abnormal movements.  No pronator drift.  Tone is normal.    Right Upper Extremity:    Left Upper Extremity:    Deltoid  5/5   Deltoid  5/5   Biceps  5/5   Biceps  5/5   Triceps  5/5   Triceps  5/5   Wrist extensors  5/5   Wrist extensors  5/5   Wrist flexors  5/5   Wrist flexors  5/5   Finger extensors  5/5   Finger extensors  5/5   Finger flexors  5/5   Finger flexors  5/5   Dorsal interossei  5/5   Dorsal interossei  5/5   Abductor pollicis  5/5   Abductor pollicis  5/5   Tone (Ashworth scale)  0  Tone (Ashworth scale)  0   Right Lower Extremity:    Left Lower Extremity:    Hip flexors  5/5   Hip flexors  5/5   Hip extensors  5/5   Hip extensors  5/5   Knee flexors  5/5   Knee flexors  5/5   Knee extensors  5/5   Knee extensors  5/5   Dorsiflexors  5/5   Dorsiflexors  5/5   Plantarflexors  5/5   Plantarflexors  5/5   Toe extensors  5/5   Toe extensors  5/5   Toe flexors  5/5   Toe flexors  5/5   Tone (Ashworth scale)  0  Tone (Ashworth scale)  0   MSRs:  Right                                                                 Left brachioradialis 3+  brachioradialis 3+  biceps 3+  biceps 3+  triceps 3+  triceps 3+  patellar 2+   patellar 2+  ankle jerk 1+  ankle jerk 1+  Hoffman no  Hoffman no  plantar response down  plantar response down   SENSORY:  Normal and symmetric perception of light touch, pinprick, vibration, and proprioception.     COORDINATION/GAIT: Normal finger-to- nose-finger.  Intact rapid alternating movements bilaterally.  Gait is antalgic due to left hip pain. Tandem and stressed gait intact.    IMPRESSION: 1.  Atypical headaches in a patient > 37 years old warrants further imaging with MRI brain. 2.  Lhermitte's sign is positive and upper extremity hyperreflexia is concerning for spinal cord pathology, so she will also have MRI cervical spine  3.  Dizziness, check US carotids.  She does not have nystagmus on exam, which would be expected in an inner ear pathology (BPPV) or central cerebellar process.    If her imaging returned normal, she may have cervicogenic headaches especially with her neck and mid back pain.  I will start her on flexeril 5mg  at bedtime and see if this helps.  She had not noticed any benefit with meclizine.   Return to clinic in 3 months.   The duration of this appointment visit was 60 minutes of face-to-face time with the patient.  Greater than 50% of this time was spent in counseling, explanation of diagnosis, planning of further management, and coordination of care.   Thank you for allowing me to participate in patient's care.  If I can answer any additional questions, I would be pleased to do so.    Sincerely,    Donika K. Posey Pronto, DO

## 2017-01-09 ENCOUNTER — Ambulatory Visit: Payer: Self-pay | Admitting: Medical

## 2017-01-09 ENCOUNTER — Telehealth: Payer: Self-pay | Admitting: Family Medicine

## 2017-01-09 NOTE — Telephone Encounter (Signed)
Patient no show appointment today due to spouse having a dr. Appointment, patient Rhonda Morrow with Percell Miller for 01/10/17, charge or no charge

## 2017-01-09 NOTE — Telephone Encounter (Signed)
No charge. 

## 2017-01-10 ENCOUNTER — Encounter: Payer: Self-pay | Admitting: Medical

## 2017-01-10 ENCOUNTER — Ambulatory Visit (INDEPENDENT_AMBULATORY_CARE_PROVIDER_SITE_OTHER): Payer: Federal, State, Local not specified - PPO | Admitting: Medical

## 2017-01-10 VITALS — BP 117/56 | HR 77 | Temp 97.8°F | Resp 16 | Ht 64.0 in | Wt 223.2 lb

## 2017-01-10 DIAGNOSIS — M25552 Pain in left hip: Secondary | ICD-10-CM

## 2017-01-10 DIAGNOSIS — J45909 Unspecified asthma, uncomplicated: Secondary | ICD-10-CM | POA: Diagnosis not present

## 2017-01-10 MED ORDER — ZOLPIDEM TARTRATE 10 MG PO TABS
10.0000 mg | ORAL_TABLET | Freq: Every evening | ORAL | 1 refills | Status: DC | PRN
Start: 2017-01-10 — End: 2017-04-30

## 2017-01-10 NOTE — Progress Notes (Signed)
Subjective:    Patient ID: Rhonda Morrow, female    DOB: [birth year], 56 y.o.   MRN: [id number]  HPI  Pt in today to review her history on left hip pain and asthma. Both stable presently. But she misses work periodically.  Reviewed office visits by specialist, myself and her pcp so I could  fill out fmla forms.  Review of Systems  Constitutional: Negative for chills, fatigue and fever.  Respiratory: Negative for cough, chest tightness, shortness of breath and wheezing.        Stable presently.  Cardiovascular: Negative for chest pain and palpitations.  Gastrointestinal: Negative for abdominal pain.  Musculoskeletal:       No hip pain flare reported to me today.  Neurological: Negative for dizziness and light-headedness.  Hematological: Negative for adenopathy. Does not bruise/bleed easily.  Psychiatric/Behavioral: Negative for behavioral problems and confusion.    Past Medical History:  Diagnosis Date  . Asthma   . Complication of anesthesia    one time woke up and was vey scared,17 yrs ago  . Deaf   . Diabetes mellitus without complication ([place])   . GERD (gastroesophageal reflux disease)   . Hypertension   . Left groin pain   . Thyroid disease      Social History   Social History  . Marital status: Married    Spouse name: N/A  . Number of children: N/A  . Years of education: N/A   Occupational History  . disabled    Social History Main Topics  . Smoking status: Never Smoker  . Smokeless tobacco: Never Used  . Alcohol use No  . Drug use: No  . Sexual activity: No   Other Topics Concern  . Not on file   Social History Narrative   Lives with family.  Has 3 children.  Works for [employer].  Education: high school.    Past Surgical History:  Procedure Laterality Date  . CARDIAC CATHETERIZATION    . CESAREAN SECTION    . CHOLECYSTECTOMY N/A 06/20/2016   Procedure: LAPAROSCOPIC CHOLECYSTECTOMY;  Surgeon: Rolm Bookbinder, MD;  Location: [facility];  Service:  General;  Laterality: N/A;  . ESOPHAGEAL MANOMETRY N/A 09/29/2013   Procedure: ESOPHAGEAL MANOMETRY (EM);  Surgeon: Milus Banister, MD;  Location: [facility];  Service: Endoscopy;  Laterality: N/A;    Family History  Problem Relation Age of Onset  . Diabetes Mother   . Heart disease Mother   . Diabetes Father     Allergies  Allergen Reactions  . Losartan Shortness Of Breath  . Oxycodone-Acetaminophen Nausea And Vomiting  . Augmentin [Amoxicillin-Pot Clavulanate] Diarrhea    Current Outpatient Prescriptions on File Prior to Visit  Medication Sig Dispense Refill  . albuterol (PROVENTIL HFA;VENTOLIN HFA) 108 (90 Base) MCG/ACT inhaler Inhale 2 puffs into the lungs every 6 (six) hours as needed for wheezing. 1 Inhaler 3  . amLODipine (NORVASC) 5 MG tablet TAKE 1 TABLET (5 MG TOTAL) BY MOUTH DAILY. 90 tablet 1  . azelastine (ASTELIN) 0.1 % nasal spray Place 2 sprays into both nostrils 2 (two) times daily. Use in each nostril as directed 30 mL 3  . cetirizine (ZYRTEC) 10 MG tablet Take 1 tablet (10 mg total) by mouth daily. 30 tablet 11  . cyclobenzaprine (FLEXERIL) 5 MG tablet Take 1 tablet (5 mg total) by mouth at bedtime as needed for muscle spasms. 30 tablet 5  . Fiber CHEW Chew 1 tablet by mouth daily.    . fluticasone (FLONASE)  50 MCG/ACT nasal spray PLACE 2 SPRAYS INTO BOTH NOSTRILS DAILY. 16 g 4  . glucose blood test strip Use as instructed--- one touch verio test strips 100 each 12  . hydrochlorothiazide (HYDRODIURIL) 12.5 MG tablet Take 1 tablet (12.5 mg total) by mouth daily. 90 tablet 1  . ipratropium-albuterol (DUONEB) 0.5-2.5 (3) MG/3ML SOLN Take 3 mLs by nebulization every 6 (six) hours as needed. 360 mL 3  . KLOR-CON M20 20 MEQ tablet TAKE 1 TABLET (20 MEQ TOTAL) BY MOUTH DAILY. 90 tablet 1  . meloxicam (MOBIC) 15 MG tablet 1/2-1 po qd prn pain 30 tablet 2  . metFORMIN (GLUCOPHAGE-XR) 500 MG 24 hr tablet TAKE 1 TABLET (500 MG TOTAL) BY MOUTH DAILY WITH BREAKFAST. 90  tablet 1  . mometasone-formoterol (DULERA) 100-5 MCG/ACT AERO Inhale 2 puffs into the lungs 2 (two) times daily. 1 Inhaler 5  . montelukast (SINGULAIR) 10 MG tablet Take 1 tablet (10 mg total) by mouth at bedtime. 30 tablet 1  . Multiple Vitamin (MULTIVITAMIN) tablet Take 1 tablet by mouth daily.    . NONFORMULARY OR COMPOUNDED ITEM Nebulizer   DX ASTHMA 1 each 0  . omeprazole (PRILOSEC) 20 MG capsule Take 1 capsule (20 mg total) by mouth daily. 30 capsule 2  . ranitidine (ZANTAC) 150 MG capsule Take 1 capsule (150 mg total) by mouth 2 (two) times daily. 180 capsule 1  . Spacer/Aero-Holding Chambers (AEROCHAMBER MV) inhaler Use as instructed 1 each 0  . traMADol (ULTRAM) 50 MG tablet Take 1 tablet (50 mg total) by mouth at bedtime as needed. 30 tablet 0  . diazepam (VALIUM) 5 MG tablet Take 1 tablet 30-45 minutes prior to procedure.  May repeat x1. (Patient not taking: Reported on 01/10/2017) 2 tablet 0  . meclizine (ANTIVERT) 25 MG tablet Take 1 tablet (25 mg total) by mouth 3 (three) times daily as needed. (Patient not taking: Reported on 01/10/2017) 45 tablet 3  . ondansetron (ZOFRAN) 4 MG tablet Take 1 tablet (4 mg total) by mouth every 8 (eight) hours as needed for nausea or vomiting. (Patient not taking: Reported on 01/10/2017) 10 tablet 0  . zolpidem (AMBIEN) 10 MG tablet Take 1 tablet (10 mg total) by mouth at bedtime as needed for sleep. (Patient not taking: Reported on 01/10/2017) 15 tablet 1   No current facility-administered medications on file prior to visit.     BP (!) 117/56 (BP Location: Right Arm, Patient Position: Sitting, Cuff Size: Large)   Pulse 77   Temp 97.8 F (36.6 C) (Oral)   Resp 16   Ht 5\' 4"  (1.626 m)   Wt 223 lb 4 oz (101.3 kg)   LMP 05/20/2012   SpO2 98%   BMI 38.32 kg/m       Objective:   Physical Exam  General- No acute distress. Pleasant patient. Neck- Full range of motion, no jvd Lungs- Clear, even and unlabored. Heart- regular rate and  rhythm. Neurologic- CNII- XII grossly intact.       Assessment & Plan:  For hip pain and asthma continue current treatment regimen.  I filled out forms of fmla for both of these conditions in addition for filling out forms related to her husband(allowing her to be off to take care of him).  Pt was provided orthopedist number to call to schedule next appointment.   Follow up with pcp as regularly scheduled or as needed

## 2017-01-10 NOTE — Patient Instructions (Addendum)
For hip pain and asthma continue current treatment regimen.  I filled out forms of fmla for both of these conditions in addition for filling out forms related to her husband(allowing her to be off to take care of him).  Pt was provided orthopedist number to call to schedule next appointment.   Follow up with pcp as regularly scheduled or as needed

## 2017-01-10 NOTE — Progress Notes (Signed)
Pre visit review using our clinic review tool, if applicable. No additional management support is needed unless otherwise documented below in the visit note/SLS  

## 2017-01-12 ENCOUNTER — Ambulatory Visit: Payer: Self-pay | Admitting: Family Medicine

## 2017-01-13 ENCOUNTER — Ambulatory Visit (HOSPITAL_BASED_OUTPATIENT_CLINIC_OR_DEPARTMENT_OTHER): Payer: Federal, State, Local not specified - PPO

## 2017-01-18 ENCOUNTER — Other Ambulatory Visit: Payer: Self-pay | Admitting: Neurology

## 2017-01-18 DIAGNOSIS — R51 Headache: Principal | ICD-10-CM

## 2017-01-18 DIAGNOSIS — R42 Dizziness and giddiness: Secondary | ICD-10-CM

## 2017-01-18 DIAGNOSIS — R519 Headache, unspecified: Secondary | ICD-10-CM

## 2017-01-27 ENCOUNTER — Encounter (HOSPITAL_BASED_OUTPATIENT_CLINIC_OR_DEPARTMENT_OTHER): Payer: Self-pay

## 2017-01-27 ENCOUNTER — Ambulatory Visit (HOSPITAL_BASED_OUTPATIENT_CLINIC_OR_DEPARTMENT_OTHER)
Admission: RE | Admit: 2017-01-27 | Discharge: 2017-01-27 | Disposition: A | Discharge status: Home / Self Care | Payer: Federal, State, Local not specified - PPO | Source: Ambulatory Visit | Attending: Family Medicine | Admitting: Family Medicine

## 2017-01-30 ENCOUNTER — Other Ambulatory Visit: Payer: Self-pay

## 2017-01-30 ENCOUNTER — Ambulatory Visit (HOSPITAL_COMMUNITY)
Admission: RE | Admit: 2017-01-30 | Discharge: 2017-01-30 | Disposition: A | Discharge status: Home / Self Care | Payer: Federal, State, Local not specified - PPO | Source: Ambulatory Visit | Attending: Cardiovascular Disease | Admitting: Cardiovascular Disease

## 2017-01-30 ENCOUNTER — Ambulatory Visit (INDEPENDENT_AMBULATORY_CARE_PROVIDER_SITE_OTHER): Payer: Federal, State, Local not specified - PPO | Admitting: Behavioral Health

## 2017-01-30 DIAGNOSIS — E538 Deficiency of other specified B group vitamins: Secondary | ICD-10-CM

## 2017-01-30 DIAGNOSIS — R51 Headache: Secondary | ICD-10-CM | POA: Insufficient documentation

## 2017-01-30 DIAGNOSIS — R42 Dizziness and giddiness: Secondary | ICD-10-CM

## 2017-01-30 DIAGNOSIS — R519 Headache, unspecified: Secondary | ICD-10-CM

## 2017-01-30 LAB — VITAMIN B12

## 2017-01-30 MED ORDER — CYANOCOBALAMIN 1000 MCG/ML IJ SOLN
1000.0000 ug | Freq: Once | INTRAMUSCULAR | Status: AC
  Administered 2017-01-30: 1000 ug via INTRAMUSCULAR

## 2017-01-30 NOTE — Progress Notes (Signed)
Pre visit review using our clinic review tool, if applicable. No additional management support is needed unless otherwise documented below in the visit note.  Patient came in office for monthly B12 injection. IM injection given in left deltoid. Patient tolerated injection well.  She voiced that on April 18th she will have a hip replacement surgery. Patient will call the office at a later date to schedule the next appointment.

## 2017-01-31 ENCOUNTER — Telehealth: Payer: Self-pay | Admitting: Family Medicine

## 2017-01-31 NOTE — Telephone Encounter (Signed)
Called the patient and due to having hip surgery in April may have to miss B12 injection until recovered from surgery. Patient wanted PCP to be aware of that and ok to restart them once she is doing better from [place].

## 2017-01-31 NOTE — Telephone Encounter (Signed)
Pt would like to be advised. She says that she is due to have another B-12 injection on 4/18. Pt says that she will be in the hospital on that day. pt would like to know how should she go about still having her injection?   [phone]

## 2017-02-01 ENCOUNTER — Telehealth: Payer: Self-pay | Admitting: Medical

## 2017-02-01 NOTE — Telephone Encounter (Signed)
Patient request that FMLA paperwork be faxed to [phone] today.

## 2017-02-01 NOTE — Telephone Encounter (Signed)
I update the date I saw her husband. So she can use flmla to care for him. If they send back then may need pcp to fill our further form as Chava and her husband have same pcp.

## 2017-02-01 NOTE — Telephone Encounter (Signed)
Patient informed left detailed message

## 2017-02-01 NOTE — Telephone Encounter (Signed)
Fax confirmation received. 

## 2017-02-01 NOTE — Telephone Encounter (Signed)
Paperwork faxed to number provided by patient. Awaiting fax confirmation.

## 2017-02-01 NOTE — Telephone Encounter (Signed)
That is fine to restart after she recovers

## 2017-02-01 NOTE — Telephone Encounter (Signed)
Placing her fmla form on your desk. Deadline to fax is today per paper work.

## 2017-02-02 ENCOUNTER — Other Ambulatory Visit: Payer: Self-pay | Admitting: Medical

## 2017-02-02 DIAGNOSIS — M25512 Pain in left shoulder: Secondary | ICD-10-CM

## 2017-02-05 NOTE — Telephone Encounter (Signed)
Pt called requesting refill on Tramadol stating she is out. Pharmacy told her to contact provider. Pt would like to pick up today.   Caller name: Rhonda Morrow thru translator Relationship to patient: self/translator Can be reached: [address]

## 2017-02-06 NOTE — Telephone Encounter (Signed)
Will send to Dr. Larose Kells

## 2017-02-06 NOTE — Telephone Encounter (Signed)
Rx printed, awaiting MD signature.  

## 2017-02-06 NOTE — Telephone Encounter (Signed)
Rx faxed to [facility].  

## 2017-02-06 NOTE — Telephone Encounter (Signed)
Okay tramadol No. 30, no refills

## 2017-02-06 NOTE — Telephone Encounter (Signed)
Rhonda Morrow-- do you know who is covering for Dr Carollee Herter today? If they can approve rx?

## 2017-02-07 NOTE — Telephone Encounter (Signed)
Patient informed. 

## 2017-02-07 NOTE — Telephone Encounter (Addendum)
Patient would like a copy of FMLA paperwork and would like to pick up in office, please advise

## 2017-02-07 NOTE — Telephone Encounter (Signed)
Pt called stating she will probably not come in to pick up paperwork until tomorrow. She is not feeling well.

## 2017-02-07 NOTE — Telephone Encounter (Addendum)
Copy made and placed up front for pick up.  Original sent for scanning.  Will you please call patient and make her aware?

## 2017-02-08 ENCOUNTER — Telehealth: Payer: Self-pay

## 2017-02-08 ENCOUNTER — Ambulatory Visit (INDEPENDENT_AMBULATORY_CARE_PROVIDER_SITE_OTHER): Payer: Federal, State, Local not specified - PPO | Admitting: Medical

## 2017-02-08 ENCOUNTER — Ambulatory Visit (HOSPITAL_BASED_OUTPATIENT_CLINIC_OR_DEPARTMENT_OTHER)
Admission: RE | Admit: 2017-02-08 | Discharge: 2017-02-08 | Disposition: A | Discharge status: Home / Self Care | Payer: Federal, State, Local not specified - PPO | Source: Ambulatory Visit | Attending: Medical | Admitting: Medical

## 2017-02-08 ENCOUNTER — Other Ambulatory Visit: Payer: Self-pay

## 2017-02-08 VITALS — HR 91 | Temp 98.5°F | Ht 64.0 in | Wt 220.6 lb

## 2017-02-08 DIAGNOSIS — R197 Diarrhea, unspecified: Secondary | ICD-10-CM

## 2017-02-08 DIAGNOSIS — R109 Unspecified abdominal pain: Secondary | ICD-10-CM

## 2017-02-08 DIAGNOSIS — M1612 Unilateral primary osteoarthritis, left hip: Secondary | ICD-10-CM | POA: Diagnosis not present

## 2017-02-08 LAB — CBC WITH DIFFERENTIAL/PLATELET
BASOS ABS: 0.1 10*3/uL (ref 0.0–0.1)
BASOS PCT: 0.7 % (ref 0.0–3.0)
EOS ABS: 0.2 10*3/uL (ref 0.0–0.7)
Eosinophils Relative: 3.2 % (ref 0.0–5.0)
HEMATOCRIT: 41.1 % (ref 36.0–46.0)
HEMOGLOBIN: 13.4 g/dL (ref 12.0–15.0)
LYMPHS PCT: 21.3 % (ref 12.0–46.0)
Lymphs Abs: 1.7 10*3/uL (ref 0.7–4.0)
MCHC: 32.6 g/dL (ref 30.0–36.0)
MCV: 82.6 fl (ref 78.0–100.0)
MONOS PCT: 9.7 % (ref 3.0–12.0)
Monocytes Absolute: 0.8 10*3/uL (ref 0.1–1.0)
Neutro Abs: 5.1 10*3/uL (ref 1.4–7.7)
Neutrophils Relative %: 65.1 % (ref 43.0–77.0)
Platelets: 258 10*3/uL (ref 150.0–400.0)
RBC: 4.98 Mil/uL (ref 3.87–5.11)
RDW: 14.3 % (ref 11.5–15.5)
WBC: 7.8 10*3/uL (ref 4.0–10.5)

## 2017-02-08 LAB — COMPREHENSIVE METABOLIC PANEL
ALBUMIN: 3.8 g/dL (ref 3.5–5.2)
ALT: 18 U/L (ref 0–35)
AST: 21 U/L (ref 0–37)
Alkaline Phosphatase: 75 U/L (ref 39–117)
BUN: 18 mg/dL (ref 6–23)
CALCIUM: 9.7 mg/dL (ref 8.4–10.5)
CO2: 32 mEq/L (ref 19–32)
CREATININE: 0.82 mg/dL (ref 0.40–1.20)
Chloride: 102 mEq/L (ref 96–112)
GFR: 93.64 mL/min (ref >60.00)
Glucose, Bld: 115 mg/dL — ABNORMAL HIGH (ref 70–99)
POTASSIUM: 3.5 meq/L (ref 3.5–5.1)
Sodium: 139 mEq/L (ref 135–145)
TOTAL PROTEIN: 7.5 g/dL (ref 6.0–8.3)
Total Bilirubin: 0.5 mg/dL (ref 0.2–1.2)

## 2017-02-08 MED ORDER — METRONIDAZOLE 500 MG PO TABS: 500.0000 mg | ORAL_TABLET | Freq: Three times a day (TID) | ORAL | 0 refills | Status: DC

## 2017-02-08 MED ORDER — IOPAMIDOL (ISOVUE-300) INJECTION 61%
100.0000 mL | Freq: Once | INTRAVENOUS | Status: AC | PRN
  Administered 2017-02-08: 100 mL via INTRAVENOUS

## 2017-02-08 MED ORDER — CIPROFLOXACIN HCL 500 MG PO TABS: 500.0000 mg | ORAL_TABLET | Freq: Two times a day (BID) | ORAL | 0 refills | Status: DC

## 2017-02-08 NOTE — Telephone Encounter (Signed)
Carrissa From CT  1. No acute findings in the abdomen or pelvis.  2. 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   Message was presented to Percell Miller he advised patient to continue with the same treatment and follow up on next Tuesday.   Pc

## 2017-02-08 NOTE — Progress Notes (Signed)
Subjective:    Patient ID: Rhonda Morrow, female    DOB: 02/19/1961, 56 y.o.   MRN: [id number]  HPI  Pt in some pain in her rectal area briefly yesterday. Pt states felt sharp pain followed then loose stool 2 times yesterday. Both time had some blood in her stool.Dark/bright red. Pt not reporting any diarrhea today yet but not eating due to concern for diarrhea. . First event happened yesterday afternoon. Then later in evening another loose stools.  Pt feels fatigued. No nausea, no vomiting. Pt does have some lower abdomen pain in the middle/suprapubic.   Yesterday felt chills.   No rectal itch or lump in rectal area.   Review of Systems  Constitutional: Positive for chills and fatigue. Negative for unexpected weight change.  Respiratory: Negative for cough, chest tightness, shortness of breath and wheezing.   Cardiovascular: Negative for chest pain and palpitations.  Gastrointestinal: Positive for abdominal pain and diarrhea. Negative for abdominal distention, anal bleeding, blood in stool, constipation, nausea, rectal pain and vomiting.  Genitourinary: Negative for difficulty urinating, flank pain, frequency, genital sores, hematuria and urgency.  Musculoskeletal: Negative for arthralgias and back pain.  Neurological: Negative for dizziness, syncope, weakness, numbness and headaches.  Hematological: Negative for adenopathy. Does not bruise/bleed easily.  Psychiatric/Behavioral: Negative for behavioral problems, confusion, dysphoric mood, hallucinations and suicidal ideas. The patient is not nervous/anxious.     Past Medical History:  Diagnosis Date  . Asthma   . Complication of anesthesia    one time woke up and was vey scared,17 yrs ago  . Deaf   . Diabetes mellitus without complication ([place])   . GERD (gastroesophageal reflux disease)   . Hypertension   . Left groin pain   . Thyroid disease      Social History   Social History  . Marital status: Married    Spouse name:  N/A  . Number of children: N/A  . Years of education: N/A   Occupational History  . disabled    Social History Main Topics  . Smoking status: Never Smoker  . Smokeless tobacco: Never Used  . Alcohol use No  . Drug use: No  . Sexual activity: No   Other Topics Concern  . Not on file   Social History Narrative   Lives with family.  Has 3 children.  Works for [employer].  Education: high school.    Past Surgical History:  Procedure Laterality Date  . CARDIAC CATHETERIZATION    . CESAREAN SECTION    . CHOLECYSTECTOMY N/A 06/20/2016   Procedure: LAPAROSCOPIC CHOLECYSTECTOMY;  Surgeon: Rolm Bookbinder, MD;  Location: [facility];  Service: General;  Laterality: N/A;  . ESOPHAGEAL MANOMETRY N/A 09/29/2013   Procedure: ESOPHAGEAL MANOMETRY (EM);  Surgeon: Milus Banister, MD;  Location: [facility];  Service: Endoscopy;  Laterality: N/A;    Family History  Problem Relation Age of Onset  . Diabetes Mother   . Heart disease Mother   . Diabetes Father     Allergies  Allergen Reactions  . Losartan Shortness Of Breath  . Oxycodone-Acetaminophen Nausea And Vomiting  . Augmentin [Amoxicillin-Pot Clavulanate] Diarrhea    Current Outpatient Prescriptions on File Prior to Visit  Medication Sig Dispense Refill  . albuterol (PROVENTIL HFA;VENTOLIN HFA) 108 (90 Base) MCG/ACT inhaler Inhale 2 puffs into the lungs every 6 (six) hours as needed for wheezing. 1 Inhaler 3  . amLODipine (NORVASC) 5 MG tablet TAKE 1 TABLET (5 MG TOTAL) BY MOUTH DAILY. 90 tablet 1  .  azelastine (ASTELIN) 0.1 % nasal spray Place 2 sprays into both nostrils 2 (two) times daily. Use in each nostril as directed 30 mL 3  . cetirizine (ZYRTEC) 10 MG tablet Take 1 tablet (10 mg total) by mouth daily. 30 tablet 11  . cyclobenzaprine (FLEXERIL) 5 MG tablet Take 1 tablet (5 mg total) by mouth at bedtime as needed for muscle spasms. 30 tablet 5  . diazepam (VALIUM) 5 MG tablet Take 1 tablet 30-45 minutes prior to procedure.   May repeat x1. 2 tablet 0  . Fiber CHEW Chew 1 tablet by mouth daily.    . fluticasone (FLONASE) 50 MCG/ACT nasal spray PLACE 2 SPRAYS INTO BOTH NOSTRILS DAILY. 16 g 4  . glucose blood test strip Use as instructed--- one touch verio test strips 100 each 12  . hydrochlorothiazide (HYDRODIURIL) 12.5 MG tablet Take 1 tablet (12.5 mg total) by mouth daily. 90 tablet 1  . ipratropium-albuterol (DUONEB) 0.5-2.5 (3) MG/3ML SOLN Take 3 mLs by nebulization every 6 (six) hours as needed. 360 mL 3  . KLOR-CON M20 20 MEQ tablet TAKE 1 TABLET (20 MEQ TOTAL) BY MOUTH DAILY. 90 tablet 1  . meclizine (ANTIVERT) 25 MG tablet Take 1 tablet (25 mg total) by mouth 3 (three) times daily as needed. 45 tablet 3  . meloxicam (MOBIC) 15 MG tablet 1/2-1 po qd prn pain 30 tablet 2  . metFORMIN (GLUCOPHAGE-XR) 500 MG 24 hr tablet TAKE 1 TABLET (500 MG TOTAL) BY MOUTH DAILY WITH BREAKFAST. 90 tablet 1  . mometasone-formoterol (DULERA) 100-5 MCG/ACT AERO Inhale 2 puffs into the lungs 2 (two) times daily. 1 Inhaler 5  . montelukast (SINGULAIR) 10 MG tablet Take 1 tablet (10 mg total) by mouth at bedtime. 30 tablet 1  . Multiple Vitamin (MULTIVITAMIN) tablet Take 1 tablet by mouth daily.    . NONFORMULARY OR COMPOUNDED ITEM Nebulizer   DX ASTHMA 1 each 0  . omeprazole (PRILOSEC) 20 MG capsule Take 1 capsule (20 mg total) by mouth daily. 30 capsule 2  . ondansetron (ZOFRAN) 4 MG tablet Take 1 tablet (4 mg total) by mouth every 8 (eight) hours as needed for nausea or vomiting. 10 tablet 0  . ranitidine (ZANTAC) 150 MG capsule Take 1 capsule (150 mg total) by mouth 2 (two) times daily. 180 capsule 1  . Spacer/Aero-Holding Chambers (AEROCHAMBER MV) inhaler Use as instructed 1 each 0  . traMADol (ULTRAM) 50 MG tablet TAKE 1 TABLET BY MOUTH AT BEDTIME AS NEEDED 30 tablet 0  . zolpidem (AMBIEN) 10 MG tablet Take 1 tablet (10 mg total) by mouth at bedtime as needed for sleep. 15 tablet 1   No current facility-administered medications  on file prior to visit.     Pulse 91   Temp 98.5 F (36.9 C) (Oral)   Ht _0  (1.626 m)   Wt 220 lb 9.6 oz (100.1 kg)   LMP 05/20/2012   SpO2 96%   BMI 37.87 kg/m       Objective:   Physical Exam  General Appearance- Not in acute distress.  HEENT Eyes- Scleraeral/Conjuntiva-bilat- Not Yellow. Mouth & Throat- Normal.  Chest and Lung Exam Auscultation: Breath sounds:-Normal. Adventitious sounds:- No Adventitious sounds.  Cardiovascular Auscultation:Rythm - Regular. Heart Sounds -Normal heart sounds.  Abdomen Inspection:-Inspection Normal.  Palpation/Perucssion: Palpation and Percussion of the abdomen reveal- mild lower quadrant areas of Tenderness, No Rebound tenderness, No rigidity(Guarding) and No Palpable abdominal masses.  Liver:-Normal.  Spleen:- Normal.   Rectal Anorectal Exam: Stool -  Hemoccult of stool/mucous is Heme Negative. External - normal external exam. Internal - normal sphincter tone. No rectal mass.      Assessment & Plan:  Your abdomen pain onset with bloody diarrhea causes concern for possible diverticulitis.  I will rx cipro and flagyl antibiotic.   Please get cbc and cmp today.  I do want you to turn in stool panel kit today.   Ct abdomen/pelvis order. Try to schedule for tomorrow.  Follow up in 4 days or as needed

## 2017-02-08 NOTE — Progress Notes (Signed)
Pre visit review using our clinic tool,if applicable. No additional management support is needed unless otherwise documented below in the visit note.  

## 2017-02-08 NOTE — Patient Instructions (Addendum)
Your abdomen pain onset with bloody diarrhea  causes concern for possible diverticulitis.  I will rx cipro and flagyl antibiotic.   Please get cbc and cmp today.  I do want you to turn in stool panel kit today.   Ct abdomen/pelvis order. Try to schedule for tomorrow.  Follow up in 4 days or as needed  

## 2017-02-09 ENCOUNTER — Other Ambulatory Visit: Payer: Self-pay | Admitting: Family Medicine

## 2017-02-09 NOTE — Addendum Note (Signed)
Addended by: Dene Gentry on: 02/09/2017 11:12 AM   Modules accepted: Orders

## 2017-02-12 ENCOUNTER — Other Ambulatory Visit: Payer: Self-pay

## 2017-02-12 ENCOUNTER — Encounter (HOSPITAL_COMMUNITY): Payer: Self-pay

## 2017-02-12 ENCOUNTER — Telehealth (INDEPENDENT_AMBULATORY_CARE_PROVIDER_SITE_OTHER): Payer: Self-pay | Admitting: Orthopaedic Surgery

## 2017-02-12 ENCOUNTER — Other Ambulatory Visit (INDEPENDENT_AMBULATORY_CARE_PROVIDER_SITE_OTHER): Payer: Self-pay | Admitting: Orthopaedic Surgery

## 2017-02-12 ENCOUNTER — Encounter (HOSPITAL_COMMUNITY)
Admission: RE | Admit: 2017-02-12 | Discharge: 2017-02-12 | Disposition: A | Discharge status: Home / Self Care | Payer: Federal, State, Local not specified - PPO | Source: Ambulatory Visit | Attending: Orthopaedic Surgery | Admitting: Orthopaedic Surgery

## 2017-02-12 DIAGNOSIS — Z0183 Encounter for blood typing: Secondary | ICD-10-CM | POA: Diagnosis not present

## 2017-02-12 DIAGNOSIS — Z79899 Other long term (current) drug therapy: Secondary | ICD-10-CM | POA: Insufficient documentation

## 2017-02-12 DIAGNOSIS — K219 Gastro-esophageal reflux disease without esophagitis: Secondary | ICD-10-CM | POA: Diagnosis not present

## 2017-02-12 DIAGNOSIS — E119 Type 2 diabetes mellitus without complications: Secondary | ICD-10-CM | POA: Diagnosis not present

## 2017-02-12 DIAGNOSIS — R197 Diarrhea, unspecified: Secondary | ICD-10-CM | POA: Diagnosis not present

## 2017-02-12 DIAGNOSIS — Z01812 Encounter for preprocedural laboratory examination: Secondary | ICD-10-CM | POA: Insufficient documentation

## 2017-02-12 DIAGNOSIS — J45909 Unspecified asthma, uncomplicated: Secondary | ICD-10-CM | POA: Insufficient documentation

## 2017-02-12 DIAGNOSIS — I10 Essential (primary) hypertension: Secondary | ICD-10-CM | POA: Diagnosis not present

## 2017-02-12 DIAGNOSIS — Z01818 Encounter for other preprocedural examination: Secondary | ICD-10-CM | POA: Diagnosis not present

## 2017-02-12 DIAGNOSIS — Z7984 Long term (current) use of oral hypoglycemic drugs: Secondary | ICD-10-CM | POA: Insufficient documentation

## 2017-02-12 DIAGNOSIS — M1612 Unilateral primary osteoarthritis, left hip: Secondary | ICD-10-CM

## 2017-02-12 HISTORY — DX: Unspecified osteoarthritis, unspecified site: M19.90

## 2017-02-12 LAB — CBC WITH DIFFERENTIAL/PLATELET
BASOS ABS: 0 10*3/uL (ref 0.0–0.1)
BASOS PCT: 1 %
EOS PCT: 4 %
Eosinophils Absolute: 0.3 10*3/uL (ref 0.0–0.7)
HCT: 41.9 % (ref 36.0–46.0)
Hemoglobin: 13.3 g/dL (ref 12.0–15.0)
LYMPHS PCT: 23 %
Lymphs Abs: 1.6 10*3/uL (ref 0.7–4.0)
MCH: 26.8 pg (ref 26.0–34.0)
MCHC: 31.7 g/dL (ref 30.0–36.0)
MCV: 84.5 fL (ref 78.0–100.0)
Monocytes Absolute: 0.5 10*3/uL (ref 0.1–1.0)
Monocytes Relative: 7 %
Neutro Abs: 4.5 10*3/uL (ref 1.7–7.7)
Neutrophils Relative %: 65 %
PLATELETS: 255 10*3/uL (ref 150–400)
RBC: 4.96 MIL/uL (ref 3.87–5.11)
RDW: 13.5 % (ref 11.5–15.5)
WBC: 6.9 10*3/uL (ref 4.0–10.5)

## 2017-02-12 LAB — COMPREHENSIVE METABOLIC PANEL
ALBUMIN: 3.3 g/dL — AB (ref 3.5–5.0)
ALT: 24 U/L (ref 14–54)
AST: 25 U/L (ref 15–41)
Alkaline Phosphatase: 65 U/L (ref 38–126)
Anion gap: 7 (ref 5–15)
BUN: 11 mg/dL (ref 6–20)
CHLORIDE: 103 mmol/L (ref 101–111)
CO2: 29 mmol/L (ref 22–32)
CREATININE: 0.79 mg/dL (ref 0.44–1.00)
Calcium: 9.2 mg/dL (ref 8.9–10.3)
GFR calc Af Amer: >60 mL/min (ref >60)
GLUCOSE: 116 mg/dL — AB (ref 65–99)
Potassium: 3.2 mmol/L — ABNORMAL LOW (ref 3.5–5.1)
SODIUM: 139 mmol/L (ref 135–145)
Total Bilirubin: 0.7 mg/dL (ref 0.3–1.2)
Total Protein: 6.8 g/dL (ref 6.5–8.1)

## 2017-02-12 LAB — GLUCOSE, CAPILLARY: Glucose-Capillary: 103 mg/dL — ABNORMAL HIGH (ref 65–99)

## 2017-02-12 LAB — URINALYSIS, ROUTINE W REFLEX MICROSCOPIC
Glucose, UA: NEGATIVE mg/dL
HGB URINE DIPSTICK: NEGATIVE
Ketones, ur: 5 mg/dL — AB
NITRITE: NEGATIVE
PH: 5 (ref 5.0–8.0)
Protein, ur: 30 mg/dL — AB
SPECIFIC GRAVITY, URINE: 1.031 — AB (ref 1.005–1.030)

## 2017-02-12 LAB — SURGICAL PCR SCREEN
MRSA, PCR: NEGATIVE
STAPHYLOCOCCUS AUREUS: NEGATIVE

## 2017-02-12 LAB — C-REACTIVE PROTEIN: CRP: 2.2 mg/dL — ABNORMAL HIGH (ref <1.0)

## 2017-02-12 LAB — TYPE AND SCREEN
ABO/RH(D): O POS
ANTIBODY SCREEN: NEGATIVE

## 2017-02-12 LAB — ABO/RH: ABO/RH(D): O POS

## 2017-02-12 LAB — APTT: APTT: 35 s (ref 24–36)

## 2017-02-12 LAB — SEDIMENTATION RATE: Sed Rate: 40 mm/hr — ABNORMAL HIGH (ref 0–22)

## 2017-02-12 LAB — PROTIME-INR
INR: 1.05
Prothrombin Time: 13.7 seconds (ref 11.4–15.2)

## 2017-02-12 NOTE — Telephone Encounter (Signed)
Please advise 

## 2017-02-12 NOTE — Telephone Encounter (Signed)
Anticipate OOW is 3 months

## 2017-02-12 NOTE — Pre-Procedure Instructions (Addendum)
Rhonda Morrow  02/12/2017      [id number] [address], [place] - [place] [address] Phone: [phone] Fax: [phone]    Your procedure is scheduled on 02/21/17  Report to [facility] Admitting at [phone].  Call this number if you have problems the morning of surgery:  [phone]   Remember:  Do not eat food or drink liquids after midnight.  Take these medicines the morning of surgery with A SIP OF WATER       Amlodipine(norvasc),inhalers if needed (bring provental),neb if needed,flagyl,ranitidine(zantac),omeprazole  STOP all herbel meds, nsaids (aleve,naproxen,advil,ibuprofen)7 days prior to surgery starting 02/14/17 including all vitamins/supplements,meloxicam  No metformin day of surgery    How to Manage Your Diabetes Before and After Surgery  Why is it important to control my blood sugar before and after surgery? . Improving blood sugar levels before and after surgery helps healing and can limit problems. . A way of improving blood sugar control is eating a healthy diet by: o  Eating less sugar and carbohydrates o  Increasing activity/exercise o  Talking with your doctor about reaching your blood sugar goals . High blood sugars (greater than 180 mg/dL) can raise your risk of infections and slow your recovery, so you will need to focus on controlling your diabetes during the weeks before surgery. . Make sure that the doctor who takes care of your diabetes knows about your planned surgery including the date and location.  How do I manage my blood sugar before surgery? . Check your blood sugar at least 4 times a day, starting 2 days before surgery, to make sure that the level is not too high or low. o Check your blood sugar the morning of your surgery when you wake up and every 2 hours until you get to the Short Stay unit. . If your blood sugar is less than 70 mg/dL, you will need to treat for low blood  sugar: o Do not take insulin. o Treat a low blood sugar (less than 70 mg/dL) with  cup of clear juice (cranberry or apple), 4 glucose tablets, OR glucose gel. o Recheck blood sugar in 15 minutes after treatment (to make sure it is greater than 70 mg/dL). If your blood sugar is not greater than 70 mg/dL on recheck, call [phone] for further instructions. . Report your blood sugar to the short stay nurse when you get to Short Stay.  . If you are admitted to the hospital after surgery: o Your blood sugar will be checked by the staff and you will probably be given insulin after surgery (instead of oral diabetes medicines) to make sure you have good blood sugar levels. o The goal for blood sugar control after surgery is 80-180 mg/dL.    WHAT DO I DO ABOUT MY DIABETES MEDICATION?   Marland Kitchen Do not take oral diabetes medicines (pills) the morning of surgery.(no metformin)    Do not wear jewelry, make-up or nail polish.  Do not wear lotions, powders, or perfumes, or deoderant.  Do not shave 48 hours prior to surgery.  Men may shave face and neck.  Do not bring valuables to the hospital.  [facility] is not responsible for any belongings or valuables.  Contacts, dentures or bridgework may not be worn into surgery.  Leave your suitcase in the car.  After surgery it may be brought to your room.  For patients admitted to the hospital, discharge time will be determined  by your treatment team.  Patients discharged the day of surgery will not be allowed to drive home.   Special instructions:   Special Instructions: Rhonda Morrow - Preparing for Surgery  Before surgery, you can play an important role.  Because skin is not sterile, your skin needs to be as free of germs as possible.  You can reduce the number of germs on you skin by washing with CHG (chlorahexidine gluconate) soap before surgery.  CHG is an antiseptic cleaner which kills germs and bonds with the skin to continue killing germs even after  washing.  Please DO NOT use if you have an allergy to CHG or antibacterial soaps.  If your skin becomes reddened/irritated stop using the CHG and inform your nurse when you arrive at Short Stay.  Do not shave (including legs and underarms) for at least 48 hours prior to the first CHG shower.  You may shave your face.  Please follow these instructions carefully:   1.  Shower with CHG Soap the night before surgery and the morning of Surgery.  2.  If you choose to wash your hair, wash your hair first as usual with your normal shampoo.  3.  After you shampoo, rinse your hair and body thoroughly to remove the Shampoo.  4.  Use CHG as you would any other liquid soap.  You can apply chg directly  to the skin and wash gently with scrungie or a clean washcloth.  5.  Apply the CHG Soap to your body ONLY FROM THE NECK DOWN.  Do not use on open wounds or open sores.  Avoid contact with your eyes ears, mouth and genitals (private parts).  Wash genitals (private parts)       with your normal soap.  6.  Wash thoroughly, paying special attention to the area where your surgery will be performed.  7.  Thoroughly rinse your body with warm water from the neck down.  8.  DO NOT shower/wash with your normal soap after using and rinsing off the CHG Soap.  9.  Pat yourself dry with a clean towel.            10.  Wear clean pajamas.            11.  Place clean sheets on your bed the night of your first shower and do not sleep with pets.  Day of Surgery  Do not apply any lotions/deodorants the morning of surgery.  Please wear clean clothes to the hospital/surgery center.  Please read over the fact sheets that you were given.

## 2017-02-12 NOTE — Telephone Encounter (Signed)
Patient called needing a note stating when she will be returning to work after surgery. The number to contact patient is [phone]

## 2017-02-13 ENCOUNTER — Ambulatory Visit (INDEPENDENT_AMBULATORY_CARE_PROVIDER_SITE_OTHER): Payer: Federal, State, Local not specified - PPO | Admitting: Medical

## 2017-02-13 ENCOUNTER — Encounter: Payer: Self-pay | Admitting: Medical

## 2017-02-13 VITALS — BP 118/56 | HR 91 | Temp 98.1°F | Resp 16 | Ht 64.0 in | Wt 221.6 lb

## 2017-02-13 DIAGNOSIS — R197 Diarrhea, unspecified: Secondary | ICD-10-CM

## 2017-02-13 DIAGNOSIS — E876 Hypokalemia: Secondary | ICD-10-CM

## 2017-02-13 DIAGNOSIS — K921 Melena: Secondary | ICD-10-CM

## 2017-02-13 LAB — OVA AND PARASITE EXAMINATION: OP: NONE SEEN

## 2017-02-13 LAB — HEMOGLOBIN A1C
Hgb A1c MFr Bld: 6.3 % — ABNORMAL HIGH (ref 4.8–5.6)
MEAN PLASMA GLUCOSE: 134 mg/dL

## 2017-02-13 LAB — CLOSTRIDIUM DIFFICILE BY PCR: Toxigenic C. Difficile by PCR: NOT DETECTED

## 2017-02-13 NOTE — Progress Notes (Signed)
Pre visit review using our clinic review tool, if applicable. No additional management support is needed unless otherwise documented below in the visit note. 

## 2017-02-13 NOTE — Patient Instructions (Addendum)
Your diarrhea is improving. I want you to continue cipro pending the stool study results. You can hold flagyl now as your ct abd/pelvis was negative. But restart flagyl if diarrhea worsens again or if abdomen pain returns.(also let us know if that were to occur)  I want you to get ifob in lab and do study in 3 weeks. Drop it off at our lab.  If you get any recurrent bloody stool or black stools will refer to GI MD. Also refer to GI md if ifob positive.  For low k continue your potassium tabs but eat banana a day for next 4-5 days. This should bring you potassium level up.  We will let you know stool tests when results come in.  Follow up as regularly scheduled with pcp or as needed with myself.

## 2017-02-13 NOTE — Progress Notes (Signed)
Subjective:    Patient ID: Rhonda Morrow, female    DOB: 06-04-1961, 56 y.o.   MRN: [id number]  HPI  Pt in feeling better overall today.  She states on Sunday she had normal bowel movement yesterday. She dropped off stools kit yesterday as well.(states collected Sunday night). Pt had bloody diarrhea first day. Loose stools after first day with no blood. She overall feels better but not completley. Pt CT abdomen the other day looked normal. Hx of polyp on colonoscopy in 2014.   Pt had mild low k on labs on recent prep labs. Her appointment for surgery on her hip is April 18,2018. No muscle cramps.   Review of Systems  Constitutional: Negative for chills, fatigue and fever.  Respiratory: Negative for cough, shortness of breath and wheezing.   Cardiovascular: Negative for chest pain and palpitations.  Gastrointestinal: Positive for abdominal pain. Negative for abdominal distention, anal bleeding, blood in stool, constipation, diarrhea, nausea and vomiting.       Faint discomfort now at times but none on exam. Formed stool since todayt/this am.  Musculoskeletal: Negative for back pain and myalgias.  Skin: Negative for rash.  Neurological: Negative for dizziness, numbness and headaches.  Hematological: Negative for adenopathy. Does not bruise/bleed easily.  Psychiatric/Behavioral: Negative for behavioral problems, confusion, dysphoric mood and suicidal ideas. The patient is not nervous/anxious.    Past Medical History:  Diagnosis Date  . Arthritis   . Asthma   . Complication of anesthesia    one time woke up and was vey scared,17 yrs ago  . Deaf   . Diabetes mellitus without complication ([place])   . GERD (gastroesophageal reflux disease)   . Hypertension   . Left groin pain   . Thyroid disease      Social History   Social History  . Marital status: Married    Spouse name: N/A  . Number of children: N/A  . Years of education: N/A   Occupational History  . disabled    Social  History Main Topics  . Smoking status: Never Smoker  . Smokeless tobacco: Never Used  . Alcohol use No  . Drug use: No  . Sexual activity: No   Other Topics Concern  . Not on file   Social History Narrative   Lives with family.  Has 3 children.  Works for [employer].  Education: high school.    Past Surgical History:  Procedure Laterality Date  . CARDIAC CATHETERIZATION    . CESAREAN SECTION    . CHOLECYSTECTOMY N/A 06/20/2016   Procedure: LAPAROSCOPIC CHOLECYSTECTOMY;  Surgeon: Rolm Bookbinder, MD;  Location: [facility];  Service: General;  Laterality: N/A;  . ESOPHAGEAL MANOMETRY N/A 09/29/2013   Procedure: ESOPHAGEAL MANOMETRY (EM);  Surgeon: Milus Banister, MD;  Location: [facility];  Service: Endoscopy;  Laterality: N/A;    Family History  Problem Relation Age of Onset  . Diabetes Mother   . Heart disease Mother   . Diabetes Father     Allergies  Allergen Reactions  . Losartan Shortness Of Breath  . Oxycodone-Acetaminophen Nausea And Vomiting  . Augmentin [Amoxicillin-Pot Clavulanate] Diarrhea    Current Outpatient Prescriptions on File Prior to Visit  Medication Sig Dispense Refill  . albuterol (PROVENTIL HFA;VENTOLIN HFA) 108 (90 Base) MCG/ACT inhaler Inhale 2 puffs into the lungs every 6 (six) hours as needed for wheezing. 1 Inhaler 3  . amLODipine (NORVASC) 5 MG tablet TAKE 1 TABLET (5 MG TOTAL) BY MOUTH DAILY. 90 tablet 1  .  azelastine (ASTELIN) 0.1 % nasal spray Place 2 sprays into both nostrils 2 (two) times daily. Use in each nostril as directed 30 mL 3  . cetirizine (ZYRTEC) 10 MG tablet Take 1 tablet (10 mg total) by mouth daily. 30 tablet 11  . ciprofloxacin (CIPRO) 500 MG tablet Take 1 tablet (500 mg total) by mouth 2 (two) times daily. 14 tablet 0  . cyclobenzaprine (FLEXERIL) 5 MG tablet Take 1 tablet (5 mg total) by mouth at bedtime as needed for muscle spasms. 30 tablet 5  . diazepam (VALIUM) 5 MG tablet Take 1 tablet 30-45 minutes prior to procedure.   May repeat x1. 2 tablet 0  . Fiber CHEW Chew 1 tablet by mouth daily.    . fluticasone (FLONASE) 50 MCG/ACT nasal spray PLACE 2 SPRAYS INTO BOTH NOSTRILS DAILY. 16 g 4  . glucose blood test strip Use as instructed--- one touch verio test strips 100 each 12  . hydrochlorothiazide (HYDRODIURIL) 12.5 MG tablet Take 1 tablet (12.5 mg total) by mouth daily. 90 tablet 1  . ipratropium-albuterol (DUONEB) 0.5-2.5 (3) MG/3ML SOLN Take 3 mLs by nebulization every 6 (six) hours as needed. (Patient taking differently: Take 3 mLs by nebulization every 6 (six) hours as needed (for shortness of breath). ) 360 mL 3  . KLOR-CON M20 20 MEQ tablet TAKE 1 TABLET (20 MEQ TOTAL) BY MOUTH DAILY. 90 tablet 1  . meclizine (ANTIVERT) 25 MG tablet Take 1 tablet (25 mg total) by mouth 3 (three) times daily as needed. 45 tablet 3  . meloxicam (MOBIC) 15 MG tablet 1/2-1 po qd prn pain (Patient taking differently: Take 7.5-15 mg by mouth daily as needed for pain. 1/2-1 po qd prn pain) 30 tablet 2  . metFORMIN (GLUCOPHAGE-XR) 500 MG 24 hr tablet TAKE 1 TABLET (500 MG TOTAL) BY MOUTH DAILY WITH BREAKFAST. 90 tablet 1  . metroNIDAZOLE (FLAGYL) 500 MG tablet Take 1 tablet (500 mg total) by mouth 3 (three) times daily. 21 tablet 0  . mometasone-formoterol (DULERA) 100-5 MCG/ACT AERO Inhale 2 puffs into the lungs 2 (two) times daily. 1 Inhaler 5  . montelukast (SINGULAIR) 10 MG tablet Take 1 tablet (10 mg total) by mouth at bedtime. 30 tablet 1  . Multiple Vitamin (MULTIVITAMIN) tablet Take 1 tablet by mouth daily.    . NONFORMULARY OR COMPOUNDED ITEM Nebulizer   DX ASTHMA 1 each 0  . omeprazole (PRILOSEC) 20 MG capsule Take 1 capsule (20 mg total) by mouth daily. 30 capsule 2  . ondansetron (ZOFRAN) 4 MG tablet Take 1 tablet (4 mg total) by mouth every 8 (eight) hours as needed for nausea or vomiting. 10 tablet 0  . ranitidine (ZANTAC) 150 MG capsule Take 1 capsule (150 mg total) by mouth 2 (two) times daily. 180 capsule 1  .  Spacer/Aero-Holding Chambers (AEROCHAMBER MV) inhaler Use as instructed 1 each 0  . traMADol (ULTRAM) 50 MG tablet TAKE 1 TABLET BY MOUTH AT BEDTIME AS NEEDED 30 tablet 0  . zolpidem (AMBIEN) 10 MG tablet Take 1 tablet (10 mg total) by mouth at bedtime as needed for sleep. 15 tablet 1   No current facility-administered medications on file prior to visit.     BP (!) 118/56 (BP Location: Right Arm, Patient Position: Sitting, Cuff Size: Large)   Pulse 91   Temp 98.1 F (36.7 C) (Oral)   Resp 16   Ht 5' 4"  (1.626 m)   Wt 221 lb 9.6 oz (100.5 kg)   LMP 05/20/2012  SpO2 97%   BMI 38.04 kg/m       Objective:   Physical Exam  General Appearance- Not in acute distress.  HEENT Eyes- Scleraeral/Conjuntiva-bilat- Not Yellow. Mouth & Throat- Normal.  Chest and Lung Exam Auscultation: Breath sounds:-Normal. Adventitious sounds:- No Adventitious sounds.  Cardiovascular Auscultation:Rythm - Regular. Heart Sounds -Normal heart sounds.  Abdomen Inspection:-Inspection Normal.  Palpation/Perucssion: Palpation and Percussion of the abdomen reveal- Non Tender, No Rebound tenderness, No rigidity(Guarding) and No Palpable abdominal masses.  Liver:-Normal.  Spleen:- Normal.    Back- no cva tenderness     Assessment & Plan:  Your diarrhea is improving. I want you to continue cipro pending the stool study results. You can hold flagyl now as your ct abd/pelvis was negative. But restart flagyl if diarrhea worsens again or if abdomen pain returns. (also let us know if that were to occur)   I want you to get ifob in lab and do study in 3 weeks. Drop it off at our lab.  If you get any recurrent bloody stool or black stools will refer to GI MD. Also refer to GI md if ifob positive.  For low k continue your potassium tabs but eat banana a day for next 4-5 days. This should bring you potassium level up.  We will let you know stool tests when results come in.  Follow up as regularly  scheduled with pcp or as needed with myself.

## 2017-02-13 NOTE — Telephone Encounter (Signed)
Ready for pick up at the front desk here in our office. Called pt and no answer LMOM with Sign language later interpreter

## 2017-02-14 NOTE — Progress Notes (Signed)
Anesthesia Chart Review:  Pt is DEAF and requires sign language interpreter  Pt is a 56 year old female scheduled for L total hip arthroplasty anterior approach on 02/21/2017 with Frankey Shown, MD  - PCP is [name], MD who referred pt to surgeon.  - Pulmonologist is Brand Males, MD  PMH includes:  HTN, DM, asthma, thyroid disease, GERD. Never smoker. BMI 38. S/p cholecystectomy 06/20/16.   - Pt seen 4/5 and 02/13/17 by Mackie Pai, PA in PCP's office for bloody stool and rectal pain 02/07/17. Exam normal, CT abd/pelvis did not show diverticulitis, no infection found in stool sample.  Pt has not had further sx.  CBC 02/12/17 normal. Mr. Harvie Heck is aware of upcoming surgery/date.   Medications include: albuterol, amlodipine, hctz, duoneb, metformin, dulera, prilosec, potassium  Preoperative labs reviewed.  HbA1c 6.3, glucose 116.   chest X-ray 02/21/16: No active cardiopulmonary disease.   EKG 04/24/16: Sinus  Rhythm. Low voltage in precordial leads.   Nuclear stress test 05/10/16:   Nuclear stress EF: 59%.  There was no ST segment deviation noted during stress.  The study is normal.  This is a low risk study.  The left ventricular ejection fraction is normal (55-65%). Apical thinning not thought to be significant No ischemia or infarction  EF 59%  Echo 04/12/16:  - Left ventricle: The cavity size was normal. Wall thickness was normal. Systolic function was normal. The estimated ejectionfraction was in the range of 60% to 65%. Wall motion was normal;there were no regional wall motion abnormalities. Leftventricular diastolic function parameters were normal.   If no changes, I anticipate pt can proceed with surgery as scheduled.   Willeen Cass, FNP-BC [facility]/Anesthesiology Phone: [phone] 02/14/2017 2:16 PM

## 2017-02-16 ENCOUNTER — Other Ambulatory Visit (INDEPENDENT_AMBULATORY_CARE_PROVIDER_SITE_OTHER): Payer: Self-pay | Admitting: Orthopaedic Surgery

## 2017-02-16 LAB — STOOL CULTURE

## 2017-02-20 MED ORDER — TRANEXAMIC ACID 1000 MG/10ML IV SOLN
1000.0000 mg | INTRAVENOUS | Status: AC
  Administered 2017-02-21: 1000 mg via INTRAVENOUS
  Filled 2017-02-20: qty 10

## 2017-02-21 ENCOUNTER — Encounter (HOSPITAL_COMMUNITY)
Admission: RE | Disposition: A | Discharge status: Home Health | Payer: Self-pay | Source: Ambulatory Visit | Attending: Orthopaedic Surgery

## 2017-02-21 ENCOUNTER — Inpatient Hospital Stay (HOSPITAL_COMMUNITY): Payer: Federal, State, Local not specified - PPO

## 2017-02-21 ENCOUNTER — Inpatient Hospital Stay (HOSPITAL_COMMUNITY): Payer: Federal, State, Local not specified - PPO | Admitting: Emergency Medicine

## 2017-02-21 ENCOUNTER — Inpatient Hospital Stay (HOSPITAL_COMMUNITY)
Admission: RE | Admit: 2017-02-21 | Discharge: 2017-02-26 | DRG: 470 | Disposition: A | Discharge status: Home Health | Payer: Federal, State, Local not specified - PPO | Source: Ambulatory Visit | Attending: Orthopaedic Surgery | Admitting: Orthopaedic Surgery

## 2017-02-21 ENCOUNTER — Encounter (HOSPITAL_COMMUNITY): Payer: Self-pay | Admitting: *Deleted

## 2017-02-21 ENCOUNTER — Inpatient Hospital Stay (HOSPITAL_COMMUNITY): Payer: Federal, State, Local not specified - PPO | Admitting: Certified Registered Nurse Anesthetist

## 2017-02-21 DIAGNOSIS — Z888 Allergy status to other drugs, medicaments and biological substances status: Secondary | ICD-10-CM

## 2017-02-21 DIAGNOSIS — Z7951 Long term (current) use of inhaled steroids: Secondary | ICD-10-CM

## 2017-02-21 DIAGNOSIS — E860 Dehydration: Secondary | ICD-10-CM | POA: Diagnosis not present

## 2017-02-21 DIAGNOSIS — I10 Essential (primary) hypertension: Secondary | ICD-10-CM | POA: Diagnosis not present

## 2017-02-21 DIAGNOSIS — Z791 Long term (current) use of non-steroidal anti-inflammatories (NSAID): Secondary | ICD-10-CM | POA: Diagnosis not present

## 2017-02-21 DIAGNOSIS — Z833 Family history of diabetes mellitus: Secondary | ICD-10-CM

## 2017-02-21 DIAGNOSIS — Z471 Aftercare following joint replacement surgery: Secondary | ICD-10-CM | POA: Diagnosis not present

## 2017-02-21 DIAGNOSIS — Z96642 Presence of left artificial hip joint: Secondary | ICD-10-CM | POA: Diagnosis not present

## 2017-02-21 DIAGNOSIS — Z885 Allergy status to narcotic agent status: Secondary | ICD-10-CM

## 2017-02-21 DIAGNOSIS — R112 Nausea with vomiting, unspecified: Secondary | ICD-10-CM | POA: Diagnosis not present

## 2017-02-21 DIAGNOSIS — Z8249 Family history of ischemic heart disease and other diseases of the circulatory system: Secondary | ICD-10-CM

## 2017-02-21 DIAGNOSIS — R197 Diarrhea, unspecified: Secondary | ICD-10-CM | POA: Diagnosis not present

## 2017-02-21 DIAGNOSIS — Z9889 Other specified postprocedural states: Secondary | ICD-10-CM | POA: Diagnosis not present

## 2017-02-21 DIAGNOSIS — Z79891 Long term (current) use of opiate analgesic: Secondary | ICD-10-CM | POA: Diagnosis not present

## 2017-02-21 DIAGNOSIS — Z419 Encounter for procedure for purposes other than remedying health state, unspecified: Secondary | ICD-10-CM

## 2017-02-21 DIAGNOSIS — E119 Type 2 diabetes mellitus without complications: Secondary | ICD-10-CM | POA: Diagnosis not present

## 2017-02-21 DIAGNOSIS — Z7984 Long term (current) use of oral hypoglycemic drugs: Secondary | ICD-10-CM | POA: Diagnosis not present

## 2017-02-21 DIAGNOSIS — Z881 Allergy status to other antibiotic agents status: Secondary | ICD-10-CM | POA: Diagnosis not present

## 2017-02-21 DIAGNOSIS — H9190 Unspecified hearing loss, unspecified ear: Secondary | ICD-10-CM | POA: Diagnosis present

## 2017-02-21 DIAGNOSIS — Z79899 Other long term (current) drug therapy: Secondary | ICD-10-CM

## 2017-02-21 DIAGNOSIS — J4541 Moderate persistent asthma with (acute) exacerbation: Secondary | ICD-10-CM | POA: Diagnosis not present

## 2017-02-21 DIAGNOSIS — M1612 Unilateral primary osteoarthritis, left hip: Secondary | ICD-10-CM | POA: Diagnosis not present

## 2017-02-21 DIAGNOSIS — K219 Gastro-esophageal reflux disease without esophagitis: Secondary | ICD-10-CM | POA: Diagnosis not present

## 2017-02-21 DIAGNOSIS — J449 Chronic obstructive pulmonary disease, unspecified: Secondary | ICD-10-CM | POA: Diagnosis not present

## 2017-02-21 DIAGNOSIS — D62 Acute posthemorrhagic anemia: Secondary | ICD-10-CM | POA: Diagnosis not present

## 2017-02-21 DIAGNOSIS — Z6837 Body mass index (BMI) 37.0-37.9, adult: Secondary | ICD-10-CM

## 2017-02-21 DIAGNOSIS — Z9049 Acquired absence of other specified parts of digestive tract: Secondary | ICD-10-CM | POA: Diagnosis not present

## 2017-02-21 DIAGNOSIS — Z96649 Presence of unspecified artificial hip joint: Secondary | ICD-10-CM

## 2017-02-21 HISTORY — PX: TOTAL HIP ARTHROPLASTY: SHX124

## 2017-02-21 LAB — GLUCOSE, CAPILLARY
GLUCOSE-CAPILLARY: 94 mg/dL (ref 65–99)
Glucose-Capillary: 110 mg/dL — ABNORMAL HIGH (ref 65–99)

## 2017-02-21 SURGERY — ARTHROPLASTY, HIP, TOTAL, ANTERIOR APPROACH
Anesthesia: General | Site: Hip | Laterality: Left

## 2017-02-21 MED ORDER — ASPIRIN EC 325 MG PO TBEC: 325.0000 mg | DELAYED_RELEASE_TABLET | Freq: Two times a day (BID) | ORAL | 0 refills | Status: DC

## 2017-02-21 MED ORDER — HYDROCODONE-ACETAMINOPHEN 7.5-325 MG PO TABS: 1.0000 | ORAL_TABLET | Freq: Four times a day (QID) | ORAL | 0 refills | Status: DC | PRN

## 2017-02-21 MED ORDER — HYDROMORPHONE HCL 1 MG/ML IJ SOLN
INTRAMUSCULAR | Status: AC
  Filled 2017-02-21: qty 1

## 2017-02-21 MED ORDER — MECLIZINE HCL 25 MG PO TABS
25.0000 mg | ORAL_TABLET | Freq: Three times a day (TID) | ORAL | Status: DC | PRN
  Filled 2017-02-21: qty 1

## 2017-02-21 MED ORDER — AMLODIPINE BESYLATE 5 MG PO TABS
5.0000 mg | ORAL_TABLET | Freq: Every day | ORAL | Status: DC
  Administered 2017-02-24 – 2017-02-26 (×3): 5 mg via ORAL
  Filled 2017-02-21 (×5): qty 1

## 2017-02-21 MED ORDER — SORBITOL 70 % SOLN
30.0000 mL | Freq: Every day | Status: DC | PRN
  Administered 2017-02-25: 30 mL via ORAL
  Filled 2017-02-21 (×2): qty 30

## 2017-02-21 MED ORDER — METHOCARBAMOL 500 MG PO TABS
500.0000 mg | ORAL_TABLET | Freq: Four times a day (QID) | ORAL | Status: DC | PRN
  Administered 2017-02-21 – 2017-02-26 (×12): 500 mg via ORAL
  Filled 2017-02-21 (×12): qty 1

## 2017-02-21 MED ORDER — FENTANYL CITRATE (PF) 250 MCG/5ML IJ SOLN
INTRAMUSCULAR | Status: AC
  Filled 2017-02-21: qty 5

## 2017-02-21 MED ORDER — CHLORHEXIDINE GLUCONATE 4 % EX LIQD: 60.0000 mL | Freq: Once | CUTANEOUS | Status: DC

## 2017-02-21 MED ORDER — MORPHINE SULFATE (PF) 2 MG/ML IV SOLN: 1.0000 mg | INTRAVENOUS | Status: DC | PRN

## 2017-02-21 MED ORDER — SULFAMETHOXAZOLE-TRIMETHOPRIM 800-160 MG PO TABS
1.0000 | ORAL_TABLET | Freq: Two times a day (BID) | ORAL | Status: DC
  Administered 2017-02-21 – 2017-02-26 (×10): 1 via ORAL
  Filled 2017-02-21 (×10): qty 1

## 2017-02-21 MED ORDER — 0.9 % SODIUM CHLORIDE (POUR BTL) OPTIME
TOPICAL | Status: DC | PRN
  Administered 2017-02-21: 1000 mL

## 2017-02-21 MED ORDER — SODIUM CHLORIDE 0.9 % IV SOLN
INTRAVENOUS | Status: DC
  Administered 2017-02-21 (×2): via INTRAVENOUS

## 2017-02-21 MED ORDER — ALBUTEROL SULFATE (2.5 MG/3ML) 0.083% IN NEBU: 3.0000 mL | INHALATION_SOLUTION | Freq: Four times a day (QID) | RESPIRATORY_TRACT | Status: DC | PRN

## 2017-02-21 MED ORDER — HYDROMORPHONE HCL 2 MG PO TABS: 2.0000 mg | ORAL_TABLET | ORAL | 0 refills | Status: DC | PRN

## 2017-02-21 MED ORDER — DIAZEPAM 5 MG PO TABS: 5.0000 mg | ORAL_TABLET | Freq: Four times a day (QID) | ORAL | Status: DC | PRN

## 2017-02-21 MED ORDER — MONTELUKAST SODIUM 10 MG PO TABS
10.0000 mg | ORAL_TABLET | Freq: Every day | ORAL | Status: DC
  Administered 2017-02-21 – 2017-02-25 (×5): 10 mg via ORAL
  Filled 2017-02-21 (×5): qty 1

## 2017-02-21 MED ORDER — HYDROMORPHONE HCL 2 MG PO TABS
2.0000 mg | ORAL_TABLET | ORAL | Status: DC | PRN
  Administered 2017-02-22 – 2017-02-23 (×4): 2 mg via ORAL
  Filled 2017-02-21 (×4): qty 1

## 2017-02-21 MED ORDER — SULFAMETHOXAZOLE-TRIMETHOPRIM 800-160 MG PO TABS: 1.0000 | ORAL_TABLET | Freq: Two times a day (BID) | ORAL | 0 refills | Status: DC

## 2017-02-21 MED ORDER — ACETAMINOPHEN 650 MG RE SUPP
650.0000 mg | Freq: Four times a day (QID) | RECTAL | Status: DC | PRN
Start: 2017-02-21 — End: 2017-02-26

## 2017-02-21 MED ORDER — LACTATED RINGERS IV SOLN
INTRAVENOUS | Status: DC | PRN
  Administered 2017-02-21 (×2): via INTRAVENOUS

## 2017-02-21 MED ORDER — DEXAMETHASONE SODIUM PHOSPHATE 10 MG/ML IJ SOLN
10.0000 mg | Freq: Once | INTRAMUSCULAR | Status: AC
  Administered 2017-02-22: 10 mg via INTRAVENOUS
  Filled 2017-02-21: qty 1

## 2017-02-21 MED ORDER — ONDANSETRON HCL 4 MG/2ML IJ SOLN
INTRAMUSCULAR | Status: AC
  Filled 2017-02-21: qty 2

## 2017-02-21 MED ORDER — ROCURONIUM BROMIDE 10 MG/ML (PF) SYRINGE
PREFILLED_SYRINGE | INTRAVENOUS | Status: DC | PRN
  Administered 2017-02-21: 50 mg via INTRAVENOUS

## 2017-02-21 MED ORDER — FIBER PO CHEW: 1.0000 | CHEWABLE_TABLET | Freq: Every day | ORAL | Status: DC

## 2017-02-21 MED ORDER — PHENYLEPHRINE 40 MCG/ML (10ML) SYRINGE FOR IV PUSH (FOR BLOOD PRESSURE SUPPORT)
PREFILLED_SYRINGE | INTRAVENOUS | Status: AC
  Filled 2017-02-21: qty 10

## 2017-02-21 MED ORDER — LIDOCAINE 2% (20 MG/ML) 5 ML SYRINGE
INTRAMUSCULAR | Status: DC | PRN
  Administered 2017-02-21: 60 mg via INTRAVENOUS

## 2017-02-21 MED ORDER — MIDAZOLAM HCL 2 MG/2ML IJ SOLN
INTRAMUSCULAR | Status: AC
  Filled 2017-02-21: qty 2

## 2017-02-21 MED ORDER — MOMETASONE FURO-FORMOTEROL FUM 100-5 MCG/ACT IN AERO
2.0000 | INHALATION_SPRAY | Freq: Two times a day (BID) | RESPIRATORY_TRACT | Status: DC
  Administered 2017-02-22 – 2017-02-26 (×8): 2 via RESPIRATORY_TRACT
  Filled 2017-02-21: qty 8.8

## 2017-02-21 MED ORDER — PROMETHAZINE HCL 25 MG PO TABS
25.0000 mg | ORAL_TABLET | Freq: Four times a day (QID) | ORAL | 1 refills | Status: DC | PRN
Start: 2017-02-21 — End: 2018-11-19

## 2017-02-21 MED ORDER — ZOLPIDEM TARTRATE 5 MG PO TABS: 5.0000 mg | ORAL_TABLET | Freq: Every evening | ORAL | Status: DC | PRN

## 2017-02-21 MED ORDER — CIPROFLOXACIN HCL 500 MG PO TABS: 500.0000 mg | ORAL_TABLET | Freq: Two times a day (BID) | ORAL | Status: DC

## 2017-02-21 MED ORDER — ONDANSETRON HCL 4 MG PO TABS: 4.0000 mg | ORAL_TABLET | Freq: Three times a day (TID) | ORAL | Status: DC | PRN

## 2017-02-21 MED ORDER — MAGNESIUM CITRATE PO SOLN: 1.0000 | Freq: Once | ORAL | Status: DC | PRN

## 2017-02-21 MED ORDER — HYDROCHLOROTHIAZIDE 12.5 MG PO CAPS
12.5000 mg | ORAL_CAPSULE | Freq: Every day | ORAL | Status: DC
  Administered 2017-02-22 – 2017-02-26 (×5): 12.5 mg via ORAL
  Filled 2017-02-21 (×5): qty 1

## 2017-02-21 MED ORDER — AZELASTINE HCL 0.1 % NA SOLN
2.0000 | Freq: Two times a day (BID) | NASAL | Status: DC
Start: 2017-02-21 — End: 2017-02-26
  Administered 2017-02-21 – 2017-02-26 (×9): 2 via NASAL
  Filled 2017-02-21 (×3): qty 30

## 2017-02-21 MED ORDER — ACETAMINOPHEN 500 MG PO TABS
1000.0000 mg | ORAL_TABLET | Freq: Four times a day (QID) | ORAL | Status: AC
  Administered 2017-02-21 – 2017-02-22 (×3): 1000 mg via ORAL
  Filled 2017-02-21 (×4): qty 2

## 2017-02-21 MED ORDER — METHOCARBAMOL 750 MG PO TABS: 750.0000 mg | ORAL_TABLET | Freq: Two times a day (BID) | ORAL | 0 refills | Status: DC | PRN

## 2017-02-21 MED ORDER — SENNOSIDES-DOCUSATE SODIUM 8.6-50 MG PO TABS: 1.0000 | ORAL_TABLET | Freq: Every evening | ORAL | 1 refills | Status: DC | PRN

## 2017-02-21 MED ORDER — ONDANSETRON HCL 4 MG/2ML IJ SOLN
4.0000 mg | Freq: Four times a day (QID) | INTRAMUSCULAR | Status: DC | PRN
  Administered 2017-02-21: 4 mg via INTRAVENOUS
  Filled 2017-02-21: qty 2

## 2017-02-21 MED ORDER — PROPOFOL 10 MG/ML IV BOLUS
INTRAVENOUS | Status: AC
  Filled 2017-02-21: qty 20

## 2017-02-21 MED ORDER — ONDANSETRON HCL 4 MG/2ML IJ SOLN
INTRAMUSCULAR | Status: DC | PRN
  Administered 2017-02-21: 4 mg via INTRAVENOUS

## 2017-02-21 MED ORDER — POTASSIUM CHLORIDE CRYS ER 20 MEQ PO TBCR
20.0000 meq | EXTENDED_RELEASE_TABLET | Freq: Every day | ORAL | Status: DC
  Administered 2017-02-22 – 2017-02-26 (×5): 20 meq via ORAL
  Filled 2017-02-21 (×5): qty 1

## 2017-02-21 MED ORDER — LACTATED RINGERS IV SOLN
INTRAVENOUS | Status: DC
  Administered 2017-02-21: 12:00:00 via INTRAVENOUS

## 2017-02-21 MED ORDER — OXYCODONE HCL ER 15 MG PO T12A
15.0000 mg | EXTENDED_RELEASE_TABLET | Freq: Two times a day (BID) | ORAL | Status: DC
  Administered 2017-02-22 – 2017-02-26 (×7): 15 mg via ORAL
  Filled 2017-02-21 (×9): qty 1

## 2017-02-21 MED ORDER — ASPIRIN EC 325 MG PO TBEC
325.0000 mg | DELAYED_RELEASE_TABLET | Freq: Two times a day (BID) | ORAL | Status: DC
  Administered 2017-02-22 – 2017-02-26 (×8): 325 mg via ORAL
  Filled 2017-02-21 (×9): qty 1

## 2017-02-21 MED ORDER — PHENOL 1.4 % MT LIQD: 1.0000 | OROMUCOSAL | Status: DC | PRN

## 2017-02-21 MED ORDER — METHOCARBAMOL 1000 MG/10ML IJ SOLN
500.0000 mg | Freq: Four times a day (QID) | INTRAVENOUS | Status: DC | PRN
  Filled 2017-02-21: qty 5

## 2017-02-21 MED ORDER — CEFAZOLIN SODIUM-DEXTROSE 2-3 GM-% IV SOLR
INTRAVENOUS | Status: DC | PRN
  Administered 2017-02-21: 2 g via INTRAVENOUS

## 2017-02-21 MED ORDER — KETOROLAC TROMETHAMINE 30 MG/ML IJ SOLN
INTRAMUSCULAR | Status: AC
  Administered 2017-02-21: 30 mg
  Filled 2017-02-21: qty 1

## 2017-02-21 MED ORDER — HYDROMORPHONE HCL 1 MG/ML IJ SOLN
0.2500 mg | INTRAMUSCULAR | Status: DC | PRN
  Administered 2017-02-21 (×3): 0.5 mg via INTRAVENOUS

## 2017-02-21 MED ORDER — SODIUM CHLORIDE 0.9 % IV SOLN
2000.0000 mg | Freq: Once | INTRAVENOUS | Status: AC
  Administered 2017-02-21: 2000 mg via TOPICAL
  Filled 2017-02-21: qty 20

## 2017-02-21 MED ORDER — ADULT MULTIVITAMIN W/MINERALS CH
1.0000 | ORAL_TABLET | Freq: Every day | ORAL | Status: DC
  Administered 2017-02-22 – 2017-02-26 (×5): 1 via ORAL
  Filled 2017-02-21 (×5): qty 1

## 2017-02-21 MED ORDER — ONDANSETRON HCL 4 MG PO TABS: 4.0000 mg | ORAL_TABLET | Freq: Four times a day (QID) | ORAL | Status: DC | PRN

## 2017-02-21 MED ORDER — PHENYLEPHRINE 40 MCG/ML (10ML) SYRINGE FOR IV PUSH (FOR BLOOD PRESSURE SUPPORT)
PREFILLED_SYRINGE | INTRAVENOUS | Status: DC | PRN
Start: 2017-02-21 — End: 2017-02-21
  Administered 2017-02-21 (×3): 80 ug via INTRAVENOUS

## 2017-02-21 MED ORDER — PROPOFOL 10 MG/ML IV BOLUS
INTRAVENOUS | Status: DC | PRN
  Administered 2017-02-21: 200 mg via INTRAVENOUS

## 2017-02-21 MED ORDER — METRONIDAZOLE 500 MG PO TABS: 500.0000 mg | ORAL_TABLET | Freq: Three times a day (TID) | ORAL | Status: DC

## 2017-02-21 MED ORDER — DEXTROSE 5 % IV SOLN
3.0000 g | Freq: Three times a day (TID) | INTRAVENOUS | Status: AC
  Administered 2017-02-21 – 2017-02-22 (×3): 3 g via INTRAVENOUS
  Filled 2017-02-21 (×4): qty 3000

## 2017-02-21 MED ORDER — ACETAMINOPHEN 325 MG PO TABS
650.0000 mg | ORAL_TABLET | Freq: Four times a day (QID) | ORAL | Status: DC | PRN
  Administered 2017-02-24 – 2017-02-25 (×3): 650 mg via ORAL
  Filled 2017-02-21 (×4): qty 2

## 2017-02-21 MED ORDER — FLUTICASONE PROPIONATE 50 MCG/ACT NA SUSP
1.0000 | Freq: Every day | NASAL | Status: DC
Start: 2017-02-22 — End: 2017-02-26
  Administered 2017-02-22 – 2017-02-26 (×5): 1 via NASAL
  Filled 2017-02-21 (×2): qty 16

## 2017-02-21 MED ORDER — SUGAMMADEX SODIUM 200 MG/2ML IV SOLN
INTRAVENOUS | Status: DC | PRN
  Administered 2017-02-21: 200 mg via INTRAVENOUS

## 2017-02-21 MED ORDER — PANTOPRAZOLE SODIUM 40 MG PO TBEC
40.0000 mg | DELAYED_RELEASE_TABLET | Freq: Every day | ORAL | Status: DC
  Administered 2017-02-22 – 2017-02-26 (×5): 40 mg via ORAL
  Filled 2017-02-21 (×5): qty 1

## 2017-02-21 MED ORDER — CEFAZOLIN SODIUM 1 G IJ SOLR
INTRAMUSCULAR | Status: AC
  Filled 2017-02-21: qty 20

## 2017-02-21 MED ORDER — SUGAMMADEX SODIUM 200 MG/2ML IV SOLN
INTRAVENOUS | Status: AC
  Filled 2017-02-21: qty 2

## 2017-02-21 MED ORDER — CLINDAMYCIN PHOSPHATE 900 MG/50ML IV SOLN
900.0000 mg | INTRAVENOUS | Status: DC
  Filled 2017-02-21: qty 50

## 2017-02-21 MED ORDER — CYCLOBENZAPRINE HCL 5 MG PO TABS: 5.0000 mg | ORAL_TABLET | Freq: Every evening | ORAL | Status: DC | PRN

## 2017-02-21 MED ORDER — DEXTROSE 5 % IV SOLN
INTRAVENOUS | Status: DC | PRN
  Administered 2017-02-21: 20 ug/min via INTRAVENOUS

## 2017-02-21 MED ORDER — KETOROLAC TROMETHAMINE 15 MG/ML IJ SOLN
30.0000 mg | Freq: Four times a day (QID) | INTRAMUSCULAR | Status: AC
  Administered 2017-02-21 (×2): 30 mg via INTRAVENOUS
  Administered 2017-02-22: 15 mg via INTRAVENOUS
  Filled 2017-02-21 (×3): qty 2

## 2017-02-21 MED ORDER — TRANEXAMIC ACID 1000 MG/10ML IV SOLN
1000.0000 mg | Freq: Once | INTRAVENOUS | Status: DC
  Filled 2017-02-21: qty 10

## 2017-02-21 MED ORDER — SODIUM CHLORIDE 0.9 % IR SOLN
Status: DC | PRN
  Administered 2017-02-21: 3000 mL

## 2017-02-21 MED ORDER — IPRATROPIUM-ALBUTEROL 0.5-2.5 (3) MG/3ML IN SOLN: 3.0000 mL | Freq: Four times a day (QID) | RESPIRATORY_TRACT | Status: DC | PRN

## 2017-02-21 MED ORDER — DIPHENHYDRAMINE HCL 12.5 MG/5ML PO ELIX: 25.0000 mg | ORAL_SOLUTION | ORAL | Status: DC | PRN

## 2017-02-21 MED ORDER — PROMETHAZINE HCL 25 MG/ML IJ SOLN: 6.2500 mg | INTRAMUSCULAR | Status: DC | PRN

## 2017-02-21 MED ORDER — ALUM & MAG HYDROXIDE-SIMETH 200-200-20 MG/5ML PO SUSP: 30.0000 mL | ORAL | Status: DC | PRN

## 2017-02-21 MED ORDER — POLYETHYLENE GLYCOL 3350 17 G PO PACK
17.0000 g | PACK | Freq: Every day | ORAL | Status: DC | PRN
  Administered 2017-02-25: 17 g via ORAL
  Filled 2017-02-21: qty 1

## 2017-02-21 MED ORDER — METOCLOPRAMIDE HCL 5 MG/ML IJ SOLN
5.0000 mg | Freq: Three times a day (TID) | INTRAMUSCULAR | Status: DC | PRN
  Administered 2017-02-21: 10 mg via INTRAVENOUS
  Filled 2017-02-21: qty 2

## 2017-02-21 MED ORDER — METOCLOPRAMIDE HCL 5 MG PO TABS: 5.0000 mg | ORAL_TABLET | Freq: Three times a day (TID) | ORAL | Status: DC | PRN

## 2017-02-21 MED ORDER — MENTHOL 3 MG MT LOZG: 1.0000 | LOZENGE | OROMUCOSAL | Status: DC | PRN

## 2017-02-21 MED ORDER — MIDAZOLAM HCL 5 MG/5ML IJ SOLN
INTRAMUSCULAR | Status: DC | PRN
  Administered 2017-02-21: 2 mg via INTRAVENOUS

## 2017-02-21 MED ORDER — FENTANYL CITRATE (PF) 100 MCG/2ML IJ SOLN
INTRAMUSCULAR | Status: DC | PRN
  Administered 2017-02-21 (×2): 50 ug via INTRAVENOUS
  Administered 2017-02-21: 150 ug via INTRAVENOUS

## 2017-02-21 SURGICAL SUPPLY — 44 items
BAG DECANTER FOR FLEXI CONT (MISCELLANEOUS) ×2 IMPLANT
CAPT HIP TOTAL 2 ×1 IMPLANT
CELLS DAT CNTRL 66122 CELL SVR (MISCELLANEOUS) ×1 IMPLANT
CLSR STERI-STRIP ANTIMIC 1/2X4 (GAUZE/BANDAGES/DRESSINGS) ×1 IMPLANT
COVER SURGICAL LIGHT HANDLE (MISCELLANEOUS) ×2 IMPLANT
DRAPE C-ARM 42X72 X-RAY (DRAPES) ×2 IMPLANT
DRAPE STERI IOBAN 125X83 (DRAPES) ×2 IMPLANT
DRAPE U-SHAPE 47X51 STRL (DRAPES) ×4 IMPLANT
DRSG AQUACEL AG ADV 3.5X10 (GAUZE/BANDAGES/DRESSINGS) ×2 IMPLANT
DRSG AQUACEL AG ADV 3.5X14 (GAUZE/BANDAGES/DRESSINGS) ×1 IMPLANT
DURAPREP 26ML APPLICATOR (WOUND CARE) ×2 IMPLANT
ELECT BLADE 4.0 EZ CLEAN MEGAD (MISCELLANEOUS) ×2
ELECT REM PT RETURN 9FT ADLT (ELECTROSURGICAL) ×2
ELECTRODE BLDE 4.0 EZ CLN MEGD (MISCELLANEOUS) ×1 IMPLANT
ELECTRODE REM PT RTRN 9FT ADLT (ELECTROSURGICAL) ×1 IMPLANT
GLOVE SKINSENSE NS SZ7.5 (GLOVE) ×1
GLOVE SKINSENSE STRL SZ7.5 (GLOVE) ×1 IMPLANT
GLOVE SURG SYN 7.5  E (GLOVE) ×2
GLOVE SURG SYN 7.5 E (GLOVE) ×2 IMPLANT
GLOVE SURG SYN 7.5 PF PI (GLOVE) ×2 IMPLANT
GOWN SRG XL XLNG 56XLVL 4 (GOWN DISPOSABLE) ×1 IMPLANT
GOWN STRL NON-REIN XL XLG LVL4 (GOWN DISPOSABLE) ×2
GOWN STRL REUS W/ TWL LRG LVL3 (GOWN DISPOSABLE) IMPLANT
GOWN STRL REUS W/TWL LRG LVL3 (GOWN DISPOSABLE) ×4
HANDPIECE INTERPULSE COAX TIP (DISPOSABLE) ×2
HOOD PEEL AWAY FLYTE STAYCOOL (MISCELLANEOUS) ×4 IMPLANT
IV NS IRRIG 3000ML ARTHROMATIC (IV SOLUTION) ×2 IMPLANT
KIT BASIN OR (CUSTOM PROCEDURE TRAY) ×2 IMPLANT
MARKER SKIN DUAL TIP RULER LAB (MISCELLANEOUS) ×2 IMPLANT
PACK TOTAL JOINT (CUSTOM PROCEDURE TRAY) ×2 IMPLANT
PACK UNIVERSAL I (CUSTOM PROCEDURE TRAY) ×2 IMPLANT
RETRACTOR WND ALEXIS 18 MED (MISCELLANEOUS) ×1 IMPLANT
RTRCTR WOUND ALEXIS 18CM MED (MISCELLANEOUS) ×2
SAW OSC TIP CART 19.5X105X1.3 (SAW) ×2 IMPLANT
SET HNDPC FAN SPRY TIP SCT (DISPOSABLE) ×1 IMPLANT
STAPLER VISISTAT 35W (STAPLE) IMPLANT
SUT ETHIBOND 2 V 37 (SUTURE) ×2 IMPLANT
SUT VIC AB 1 CT1 27 (SUTURE) ×2
SUT VIC AB 1 CT1 27XBRD ANBCTR (SUTURE) ×1 IMPLANT
SUT VIC AB 2-0 CT1 27 (SUTURE) ×2
SUT VIC AB 2-0 CT1 TAPERPNT 27 (SUTURE) ×1 IMPLANT
TOWEL OR 17X26 10 PK STRL BLUE (TOWEL DISPOSABLE) ×2 IMPLANT
TRAY CATH 16FR W/PLASTIC CATH (SET/KITS/TRAYS/PACK) ×2 IMPLANT
YANKAUER SUCT BULB TIP NO VENT (SUCTIONS) ×2 IMPLANT

## 2017-02-21 NOTE — Op Note (Signed)
LEFT TOTAL HIP ARTHROPLASTY ANTERIOR APPROACH  Procedure Note Rhonda Morrow   [id number]  Pre-op Diagnosis: left hip degenerative joint disease     Post-op Diagnosis: same   Operative Procedures  1. Total hip replacement; Left hip; uncemented [id number]   Personnel  Surgeon(s): Leandrew Koyanagi, MD   Anesthesia: general  Prosthesis: Depuy Acetabulum: Pinnacle 54 mm Femur: Corail KA 11 Head: 36 size: +1.5 Liner: +4 Bearing Type: ceramic on poly  Total Hip Arthroplasty (Anterior Approach) Op Note:  After informed consent was obtained and the operative extremity marked in the holding area, the patient was brought back to the operating room and placed supine on the HANA table. Next, the operative extremity was prepped and draped in normal sterile fashion. Surgical timeout occurred verifying patient identification, surgical site, surgical procedure and administration of antibiotics.  A modified anterior Smith-Peterson approach to the hip was performed, using the interval between tensor fascia lata and sartorius.  Dissection was carried bluntly down onto the anterior hip capsule. The lateral femoral circumflex vessels were identified and coagulated. A capsulotomy was performed and the capsular flaps tagged for later repair.  Fluoroscopy was utilized to prepare for the femoral neck cut. The neck osteotomy was performed. The femoral head was removed, the acetabular rim was cleared of soft tissue and attention was turned to reaming the acetabulum.  Sequential reaming was performed under fluoroscopic guidance. We reamed to a size 53 mm, and then impacted the acetabular shell. The liner was then placed after irrigation and attention turned to the femur.  After placing the femoral hook, the leg was taken to externally rotated, extended and adducted position taking care to perform soft tissue releases to allow for adequate mobilization of the femur. Soft tissue was cleared from the shoulder of the greater  trochanter and the hook elevator used to improve exposure of the proximal femur. Sequential broaching performed up to a size 11. Trial neck and head were placed. The leg was brought back up to neutral and the construct reduced. The position and sizing of components, offset and leg lengths were checked using fluoroscopy. Stability of the  construct was checked in extension and external rotation without any subluxation or impingement of prosthesis. We dislocated the prosthesis, dropped the leg back into position, removed trial components, and irrigated copiously. The final stem and head was then placed, the leg brought back up, the system reduced and fluoroscopy used to verify positioning.  We irrigated, obtained hemostasis and closed the capsule using #2 ethibond suture.  Dilute betadyne solution was used. The fascia was closed with #1 vicryl plus, the deep fat layer was closed with 0 vicryl, the subcutaneous layers closed with 2.0 Vicryl Plus and the skin closed with 3.0 monocryl and steri strips. A sterile dressing was applied. The patient was awakened in the operating room and taken to recovery in stable condition.  All sponge, needle, and instrument counts were correct at the end of the case.   Position: supine  Complications: none.  Time Out: performed   Drains/Packing: none  Estimated blood loss: 200 cc  Returned to Recovery Room: in good condition.   Antibiotics: yes   Mechanical VTE (DVT) Prophylaxis: sequential compression devices, TED thigh-high  Chemical VTE (DVT) Prophylaxis: aspirin   Fluid Replacement: see anesthesia record  Specimens Removed: 1 to pathology   Sponge and Instrument Count Correct? yes   PACU: portable radiograph - low AP   Admission: inpatient status  Plan/RTC: Return in 2 weeks for staple  removal. Weight Bearing/Load Lower Extremity: full  Hip precautions: none Suture Removal: 10-14 days  Betadine to incision twice daily once dressing is removed on  POD#7  N. Eduard Roux, MD [facility] 3:57 PM      Implant Name Type Inv. Item Serial No. Manufacturer Lot No. LRB No. Used  LINER NEUTRAL 54X36MM PLUS 4 - [id number] Hips LINER NEUTRAL 54X36MM PLUS 4  DEPUY SYNTHES [id number] Left 1  SCREW 6.5MMX25MM - [id number] Screw SCREW 6.5MMX25MM  DEPUY SYNTHES [id number] Left 1  ACETABULAR CUP Daisy Blossom 54MM - [id number] Plate ACETABULAR CUP W GRIPTION 54MM  DEPUY SYNTHES [id number] Left 1  STEM CORAIL KA11 - [id number] Stem STEM CORAIL KA11  DEPUY SYNTHES [id number] Left 1  HEAD CERAMIC DELTA 36 PLUS 1.5 - [id number] Hips HEAD CERAMIC DELTA 36 PLUS 1.5   DEPUY SYNTHES [id number] Left 1

## 2017-02-21 NOTE — Anesthesia Preprocedure Evaluation (Signed)
Anesthesia Evaluation  Patient identified by MRN, date of birth, ID band Patient awake    Reviewed: Allergy & Precautions, NPO status , Patient's Chart, lab work & pertinent test results  History of Anesthesia Complications Negative for: history of anesthetic complications  Airway Mallampati: II  TM Distance: >3 FB Neck ROM: Full    Dental  (+) Chipped, Missing, Dental Advisory Given   Pulmonary asthma , COPD,  COPD inhaler,    breath sounds clear to auscultation       Cardiovascular hypertension, Pt. on medications (-) angina Rhythm:Regular Rate:Normal  '17 ECHO: EF 60-65%, valves OK   Neuro/Psych deaf    GI/Hepatic Neg liver ROS, GERD  Medicated and Controlled,  Endo/Other  diabetes (glu 94), Oral Hypoglycemic AgentsMorbid obesity  Renal/GU negative Renal ROS     Musculoskeletal  (+) Arthritis , Osteoarthritis,    Abdominal (+) + obese,   Peds  Hematology   Anesthesia Other Findings   Reproductive/Obstetrics                             Anesthesia Physical Anesthesia Plan  ASA: III  Anesthesia Plan: General   Post-op Pain Management:    Induction: Intravenous  Airway Management Planned: Oral ETT  Additional Equipment:   Intra-op Plan:   Post-operative Plan: Extubation in OR  Informed Consent: I have reviewed the patients History and Physical, chart, labs and discussed the procedure including the risks, benefits and alternatives for the proposed anesthesia with the patient or authorized representative who has indicated his/her understanding and acceptance.   Dental advisory given  Plan Discussed with: CRNA and Surgeon  Anesthesia Plan Comments: (Plan routine monitors, GETA Pt agrees GETA rather than SAB, because limited ability to communicate without ASL)        Anesthesia Quick Evaluation

## 2017-02-21 NOTE — Anesthesia Postprocedure Evaluation (Signed)
Anesthesia Post Note  Patient: Caraline Deutschman  Procedure(s) Performed: Procedure(s) (LRB): LEFT TOTAL HIP ARTHROPLASTY ANTERIOR APPROACH (Left)  Patient location during evaluation: PACU Anesthesia Type: General Level of consciousness: awake and alert Pain management: pain level controlled Vital Signs Assessment: post-procedure vital signs reviewed and stable Respiratory status: spontaneous breathing, nonlabored ventilation, respiratory function stable and patient connected to nasal cannula oxygen Cardiovascular status: blood pressure returned to baseline and stable Postop Assessment: no signs of nausea or vomiting Anesthetic complications: no       Last Vitals:  Vitals:   02/21/17 1740 02/21/17 1801  BP: 111/63 (P) 121/67  Pulse: (!) 104 (P) 96  Resp: 11 (P) 16  Temp: 36.6 C (P) 36.5 C    Last Pain:  Vitals:   02/21/17 1700  TempSrc:   PainSc: Tyler Deis

## 2017-02-21 NOTE — H&P (Signed)
PREOPERATIVE H&P  Chief Complaint: left hip degenerative joint disease  HPI: Rhonda Morrow is a 56 y.o. female who presents for surgical treatment of left hip degenerative joint disease.  She denies any changes in medical history.  Past Medical History:  Diagnosis Date  . Arthritis   . Asthma   . Complication of anesthesia    one time woke up and was vey scared,17 yrs ago  . Deaf   . Diabetes mellitus without complication ([place])   . GERD (gastroesophageal reflux disease)   . Hypertension   . Left groin pain   . Thyroid disease    Past Surgical History:  Procedure Laterality Date  . CARDIAC CATHETERIZATION    . CESAREAN SECTION    . CHOLECYSTECTOMY N/A 06/20/2016   Procedure: LAPAROSCOPIC CHOLECYSTECTOMY;  Surgeon: Rolm Bookbinder, MD;  Location: [facility];  Service: General;  Laterality: N/A;  . ESOPHAGEAL MANOMETRY N/A 09/29/2013   Procedure: ESOPHAGEAL MANOMETRY (EM);  Surgeon: Milus Banister, MD;  Location: [facility];  Service: Endoscopy;  Laterality: N/A;   Social History   Social History  . Marital status: Married    Spouse name: N/A  . Number of children: N/A  . Years of education: N/A   Occupational History  . disabled    Social History Main Topics  . Smoking status: Never Smoker  . Smokeless tobacco: Never Used  . Alcohol use No  . Drug use: No  . Sexual activity: No   Other Topics Concern  . Not on file   Social History Narrative   Lives with family.  Has 3 children.  Works for [employer].  Education: high school.   Family History  Problem Relation Age of Onset  . Diabetes Mother   . Heart disease Mother   . Diabetes Father    Allergies  Allergen Reactions  . Losartan Shortness Of Breath  . Oxycodone-Acetaminophen Nausea And Vomiting  . Augmentin [Amoxicillin-Pot Clavulanate] Diarrhea   Prior to Admission medications   Medication Sig Start Date End Date Taking? Authorizing Provider  albuterol (PROVENTIL HFA;VENTOLIN HFA) 108 (90 Base)  MCG/ACT inhaler Inhale 2 puffs into the lungs every 6 (six) hours as needed for wheezing. 11/17/16  Yes Edward Saguier, PA-C  amLODipine (NORVASC) 5 MG tablet TAKE 1 TABLET (5 MG TOTAL) BY MOUTH DAILY. 12/04/16  Yes Yvonne R Lowne Chase, DO  azelastine (ASTELIN) 0.1 % nasal spray Place 2 sprays into both nostrils 2 (two) times daily. Use in each nostril as directed 09/01/16  Yes Yvonne R Lowne Chase, DO  cetirizine (ZYRTEC) 10 MG tablet Take 1 tablet (10 mg total) by mouth daily. 09/01/16  Yes Yvonne R Lowne Chase, DO  ciprofloxacin (CIPRO) 500 MG tablet Take 1 tablet (500 mg total) by mouth 2 (two) times daily. 02/08/17  Yes Edward Saguier, PA-C  cyclobenzaprine (FLEXERIL) 5 MG tablet Take 1 tablet (5 mg total) by mouth at bedtime as needed for muscle spasms. 01/08/17  Yes Donika K Patel, DO  diazepam (VALIUM) 5 MG tablet Take 1 tablet 30-45 minutes prior to procedure.  May repeat x1. 12/27/16  Yes Dene Gentry, MD  Fiber CHEW Chew 1 tablet by mouth daily.   Yes Historical Provider, MD  fluticasone (FLONASE) 50 MCG/ACT nasal spray PLACE 2 SPRAYS INTO BOTH NOSTRILS DAILY. 09/01/16  Yes Yvonne R Lowne Chase, DO  hydrochlorothiazide (HYDRODIURIL) 12.5 MG tablet Take 1 tablet (12.5 mg total) by mouth daily. 02/23/16  Yes [place], DO  ipratropium-albuterol (  DUONEB) 0.5-2.5 (3) MG/3ML SOLN Take 3 mLs by nebulization every 6 (six) hours as needed. Patient taking differently: Take 3 mLs by nebulization every 6 (six) hours as needed (for shortness of breath).  02/25/16  Yes Yvonne R Lowne Chase, DO  KLOR-CON M20 20 MEQ tablet TAKE 1 TABLET (20 MEQ TOTAL) BY MOUTH DAILY. 10/18/16  Yes Yvonne R Lowne Chase, DO  meclizine (ANTIVERT) 25 MG tablet Take 1 tablet (25 mg total) by mouth 3 (three) times daily as needed. 01/30/13  Yes Midge Minium, MD  meloxicam (MOBIC) 15 MG tablet 1/2-1 po qd prn pain Patient taking differently: Take 7.5-15 mg by mouth daily as needed for pain. 1/2-1 po qd prn pain  12/04/16  Yes Yvonne R Lowne Chase, DO  metFORMIN (GLUCOPHAGE-XR) 500 MG 24 hr tablet TAKE 1 TABLET (500 MG TOTAL) BY MOUTH DAILY WITH BREAKFAST. 09/25/16  Yes Yvonne R Lowne Chase, DO  metroNIDAZOLE (FLAGYL) 500 MG tablet Take 1 tablet (500 mg total) by mouth 3 (three) times daily. 02/08/17  Yes Edward Saguier, PA-C  mometasone-formoterol (DULERA) 100-5 MCG/ACT AERO Inhale 2 puffs into the lungs 2 (two) times daily. 11/17/16  Yes Edward Saguier, PA-C  montelukast (SINGULAIR) 10 MG tablet Take 1 tablet (10 mg total) by mouth at bedtime. 12/29/16  Yes Edward Saguier, PA-C  Multiple Vitamin (MULTIVITAMIN) tablet Take 1 tablet by mouth daily.   Yes Historical Provider, MD  omeprazole (PRILOSEC) 20 MG capsule Take 1 capsule (20 mg total) by mouth daily. 08/21/16  Yes Edward Saguier, PA-C  ondansetron (ZOFRAN) 4 MG tablet Take 1 tablet (4 mg total) by mouth every 8 (eight) hours as needed for nausea or vomiting. 11/17/16  Yes Margarita Mail, PA-C  ranitidine (ZANTAC) 150 MG capsule Take 1 capsule (150 mg total) by mouth 2 (two) times daily. 12/04/16  Yes Yvonne R Lowne Chase, DO  traMADol (ULTRAM) 50 MG tablet TAKE 1 TABLET BY MOUTH AT BEDTIME AS NEEDED 02/06/17  Yes Colon Branch, MD  zolpidem (AMBIEN) 10 MG tablet Take 1 tablet (10 mg total) by mouth at bedtime as needed for sleep. 01/10/17 02/12/17 Yes Edward Saguier, PA-C  glucose blood test strip Use as instructed--- one touch verio test strips 10/16/16   Ann Held, DO  NONFORMULARY OR COMPOUNDED ITEM Nebulizer   DX ASTHMA 02/25/16   Rosalita Chessman Chase, DO  Spacer/Aero-Holding Chambers (AEROCHAMBER MV) inhaler Use as instructed 03/28/16   Tammy S Parrett, NP     Positive ROS: All other systems have been reviewed and were otherwise negative with the exception of those mentioned in the HPI and as above.  Physical Exam: General: Alert, no acute distress Cardiovascular: No pedal edema Respiratory: No cyanosis, no use of accessory musculature GI:  abdomen soft Skin: No lesions in the area of chief complaint Neurologic: Sensation intact distally Psychiatric: Patient is competent for consent with normal mood and affect Lymphatic: no lymphedema  MUSCULOSKELETAL: exam stable  Assessment: left hip degenerative joint disease  Plan: Plan for Procedure(s): LEFT TOTAL HIP ARTHROPLASTY ANTERIOR APPROACH  The risks benefits and alternatives were discussed with the patient including but not limited to the risks of nonoperative treatment, versus surgical intervention including infection, bleeding, nerve injury,  blood clots, cardiopulmonary complications, morbidity, mortality, among others, and they were willing to proceed.   Eduard Roux, MD   02/21/2017 8:24 AM

## 2017-02-21 NOTE — Anesthesia Procedure Notes (Signed)
Procedure Name: Intubation Date/Time: 02/21/2017 2:05 PM Performed by: Garrison [place] T Pre-anesthesia Checklist: Patient identified, Emergency Drugs available, Suction available and Patient being monitored Patient Re-evaluated:Patient Re-evaluated prior to inductionOxygen Delivery Method: Circle System Utilized Preoxygenation: Pre-oxygenation with 100% oxygen Intubation Type: IV induction Ventilation: Mask ventilation without difficulty Laryngoscope Size: Miller and 2 Grade View: Grade I Tube type: Oral Tube size: 7.0 mm Number of attempts: 1 Airway Equipment and Method: Stylet and Oral airway Placement Confirmation: ETT inserted through vocal cords under direct vision,  positive ETCO2 and breath sounds checked- equal and bilateral Secured at: 21 cm Tube secured with: Tape Dental Injury: Teeth and Oropharynx as per pre-operative assessment

## 2017-02-21 NOTE — Transfer of Care (Signed)
Immediate Anesthesia Transfer of Care Note  Patient: Rhonda Morrow  Procedure(s) Performed: Procedure(s): LEFT TOTAL HIP ARTHROPLASTY ANTERIOR APPROACH (Left)  Patient Location: PACU  Anesthesia Type:General  Level of Consciousness: awake and alert   Airway & Oxygen Therapy: Patient Spontanous Breathing  Post-op Assessment: Report given to RN, Post -op Vital signs reviewed and stable and Patient moving all extremities X 4  Post vital signs: Reviewed and stable  Last Vitals:  Vitals:   02/21/17 1145  BP: (!) 174/103  Pulse: (!) 109  Resp: 18  Temp: 36.7 C    Last Pain:  Vitals:   02/21/17 1145  TempSrc: Oral  PainSc:       Patients Stated Pain Goal: 6 (79/39/03 0092)  Complications: No apparent anesthesia complications

## 2017-02-21 NOTE — Discharge Instructions (Signed)

## 2017-02-22 ENCOUNTER — Encounter (HOSPITAL_COMMUNITY): Payer: Self-pay | Admitting: Orthopaedic Surgery

## 2017-02-22 LAB — BASIC METABOLIC PANEL
ANION GAP: 9 (ref 5–15)
BUN: 23 mg/dL — AB (ref 6–20)
CALCIUM: 8.5 mg/dL — AB (ref 8.9–10.3)
CO2: 25 mmol/L (ref 22–32)
Chloride: 103 mmol/L (ref 101–111)
Creatinine, Ser: 1.3 mg/dL — ABNORMAL HIGH (ref 0.44–1.00)
GFR calc Af Amer: 53 mL/min — ABNORMAL LOW (ref >60)
GFR, EST NON AFRICAN AMERICAN: 46 mL/min — AB (ref >60)
Glucose, Bld: 107 mg/dL — ABNORMAL HIGH (ref 65–99)
POTASSIUM: 3.9 mmol/L (ref 3.5–5.1)
SODIUM: 137 mmol/L (ref 135–145)

## 2017-02-22 LAB — CBC
HCT: 35.9 % — ABNORMAL LOW (ref 36.0–46.0)
Hemoglobin: 11.4 g/dL — ABNORMAL LOW (ref 12.0–15.0)
MCH: 26.8 pg (ref 26.0–34.0)
MCHC: 31.8 g/dL (ref 30.0–36.0)
MCV: 84.5 fL (ref 78.0–100.0)
PLATELETS: 208 10*3/uL (ref 150–400)
RBC: 4.25 MIL/uL (ref 3.87–5.11)
RDW: 14.2 % (ref 11.5–15.5)
WBC: 9.2 10*3/uL (ref 4.0–10.5)

## 2017-02-22 LAB — GLUCOSE, CAPILLARY
GLUCOSE-CAPILLARY: 135 mg/dL — AB (ref 65–99)
GLUCOSE-CAPILLARY: 153 mg/dL — AB (ref 65–99)

## 2017-02-22 NOTE — Care Management Note (Signed)
Case Management Note  Patient Details  Name: Rhonda Morrow MRN: [id number] Date of Birth: 13-Jan-1961  Subjective/Objective:      56 yr old female s/p left total hip arthroplasty, anterior approach.              Action/Plan: Case manager spoke with patient through sign interpreter, patient and husband are deaf. Choice was offered for Home Health agency, referral was called to [facility].  Patient will have family support at discharge.   Expected Discharge Date:   02/23/17               Expected Discharge Plan:  [place]  In-House Referral:  NA  Discharge planning Services  CM Consult  Post Acute Care Choice:  Durable Medical Equipment, Home Health Choice offered to:  Patient (via sign interpreter)  DME Arranged:  3-N-1, Walker rolling DME Agency:  [place]:  PT [place] Agency:  [facility]  Status of Service:  Completed, signed off  If discussed at [place] of Stay Meetings, dates discussed:    Additional Comments:  Ninfa Meeker, RN 02/22/2017, 3:27 PM

## 2017-02-22 NOTE — Progress Notes (Signed)
02/22/17 1702  PT Visit Information  Last PT Received On 02/22/17  Assistance Needed +1  History of Present Illness 56 y.o. female admitted to [facility] on 02/21/17 for elective direct anterior THA.  Pt with significant PMHx of HTN, DM, and deaf.  Subjective Data  Patient Stated Goal to get back to walking pain free  Precautions  Precautions None  Restrictions  LLE Weight Bearing WBAT  Pain Assessment  Pain Assessment 0-10  Pain Score 7  Pain Location left hip   Pain Descriptors / Indicators Aching;Burning  Pain Intervention(s) Limited activity within patient's tolerance;Monitored during session;Repositioned;Ice applied  Cognition  Arousal/Alertness Awake/alert  Behavior During Therapy WFL for tasks assessed/performed  Overall Cognitive Status Within Functional Limits for tasks assessed  Bed Mobility  Overal bed mobility Needs Assistance  Bed Mobility Sit to Supine;Supine to Sit  Supine to sit Min assist  Sit to supine Min assist  General bed mobility comments Min assist to help progress left leg over EOB. Min assist to help lift her leg back into the bed.  She exits the bed to the right at home  Transfers  Overall transfer level Needs assistance  Equipment used Rolling walker (2 wheeled)  Transfers Sit to/from Stand  Sit to Stand Min assist  General transfer comment Min assist to support trunk during transitions.  manual cues for safe hand placement.   Ambulation/Gait  Ambulation/Gait assistance Min assist  Ambulation Distance (Feet) 100 Feet (with one seated rest break)  Assistive device Rolling walker (2 wheeled)  Gait Pattern/deviations Step-to pattern;Antalgic  General Gait Details Verbal and tactile cues for upright posture and safe RW use.  Pt took one seated rest break and then was able to continue.   Gait velocity decreased  Gait velocity interpretation Below normal speed for age/gender  Balance  Overall balance assessment Needs assistance  Sitting-balance support Feet  supported;No upper extremity supported  Sitting balance-Leahy Scale Good  Standing balance support Bilateral upper extremity supported  Standing balance-Leahy Scale Poor  Exercises  Exercises Total Joint  Total Joint Exercises  Ankle Circles/Pumps AROM;Both;20 reps  Quad Sets AROM;Left;10 reps  Heel Slides AAROM;Left;10 reps  Hip ABduction/ADduction AAROM;Left;10 reps  Short Arc Quad AROM;Left;10 reps  PT - End of Session  Equipment Utilized During Treatment Gait belt  Activity Tolerance Patient limited by pain  Patient left with call bell/phone within reach;in bed  Nurse Communication Patient requests pain meds  PT - Assessment/Plan  PT Plan Current plan remains appropriate  PT Visit Diagnosis Muscle weakness (generalized) (M62.81);Difficulty in walking, not elsewhere classified (R26.2);Pain  Pain - Right/Left Left  Pain - part of body Hip  PT Frequency (ACUTE ONLY) 7X/week  Follow Up Recommendations Home health PT;Supervision for mobility/OOB  PT equipment Rolling walker with 5" wheels;3in1 (PT)  AM-PAC PT "6 Clicks" Daily Activity Outcome Measure  Difficulty turning over in bed (including adjusting bedclothes, sheets and blankets)? 1  Difficulty moving from lying on back to sitting on the side of the bed?  1  Difficulty sitting down on and standing up from a chair with arms (e.g., wheelchair, bedside commode, etc,.)? 1  Help needed moving to and from a bed to chair (including a wheelchair)? 3  Help needed walking in hospital room? 3  Help needed climbing 3-5 steps with a railing?  2  6 Click Score 11  Mobility G Code  CL  Acute Rehab PT Goals  PT Goal Formulation With patient  Time For Goal Achievement 03/01/17  Potential to Achieve  Goals Good  PT Time Calculation  PT Start Time (ACUTE ONLY) 1610  PT Stop Time (ACUTE ONLY) 1702  PT Time Calculation (min) (ACUTE ONLY) 52 min  PT General Charges  $$ ACUTE PT VISIT 1 Procedure  PT Treatments  $Gait Training 8-22 mins   $Therapeutic Exercise 8-22 mins  $Therapeutic Activity 8-22 mins  02/22/2017 Pt was able to progress gait into the hallway this PM and I did have an interpreter present.  HEP progressed as well.  Tomorrow's challenge will be stair negotiation as she has quite a few steps to get up to her bedroom and bathroom.  Barbarann Ehlers [place], [place], DPT [phone]

## 2017-02-22 NOTE — Evaluation (Signed)
Physical Therapy Evaluation Patient Details Name: Rhonda Morrow MRN: [id number] DOB: [birth year] Today's Date: 02/22/2017   History of Present Illness  56 y.o. female admitted to [facility] on 02/21/17 for elective direct anterior THA.  Pt with significant PMHx of HTN, DM, and deaf.  Clinical Impression  Pt is POD #1 and is moving well, min assist overall for OOB to chair.  I did not have an interpreter with me during this first session so I did not feel comfortable progressing gait into the hallway. HEP initiated.  PT plans gait into the hallway this PM and I have called for an interpreter to be present.  PT to follow acutely for deficits listed below.       Follow Up Recommendations Home health PT;Supervision for mobility/OOB    Equipment Recommendations  Rolling walker with 5" wheels;3in1 (PT)    Recommendations for Other Services   NA    Precautions / Restrictions Precautions Precautions: None Restrictions LLE Weight Bearing: Weight bearing as tolerated      Mobility  Bed Mobility Overal bed mobility: Needs Assistance Bed Mobility: Supine to Sit;Sit to Supine     Supine to sit: Min assist     General bed mobility comments: Min assist to help progress left leg over EOB.   Transfers Overall transfer level: Needs assistance Equipment used: Rolling walker (2 wheeled) Transfers: Sit to/from Stand Sit to Stand: Min assist         General transfer comment: Min assist to support trunk during transitions.  manual cues for safe hand placement.   Ambulation/Gait Ambulation/Gait assistance: Min assist Ambulation Distance (Feet): 8 Feet Assistive device: Rolling walker (2 wheeled) Gait Pattern/deviations: Step-to pattern;Antalgic     General Gait Details: tactile cues for LE sequencing, RW use.          Balance Overall balance assessment: Needs assistance Sitting-balance support: Feet supported;No upper extremity supported Sitting balance-Leahy Scale: Good     Standing  balance support: Bilateral upper extremity supported Standing balance-Leahy Scale: Poor                               Pertinent Vitals/Pain Pain Assessment: Faces Pain Score: 7  Pain Location: left hip  Pain Descriptors / Indicators: Aching;Burning Pain Intervention(s): Limited activity within patient's tolerance;Monitored during session;Repositioned;Ice applied    Home Living Family/patient expects to be discharged to:: Private residence Living Arrangements: Spouse/significant other;Parent;Children Available Help at Discharge: Family;Available 24 hours/day Type of Home: House Home Access: Stairs to enter Entrance Stairs-Rails: None Entrance Stairs-Number of Steps: 2 Home Layout: Two level;Bed/bath upstairs Home Equipment: None (walker and 3-in-1 delivered to his room. )      Prior Function Level of Independence: Independent         Comments: drives, works full time for the [employer], drives a fork lift.      Hand Dominance   Dominant Hand: Right    Extremity/Trunk Assessment   Upper Extremity Assessment Upper Extremity Assessment: Overall WFL for tasks assessed;LUE deficits/detail (generaly strength WNL, poor endurance for RW use) LUE Deficits / Details: Per pt report L shoulder needs surgery.  Functionally ok with RW use and pushing and pulling with arms.     Lower Extremity Assessment Lower Extremity Assessment: LLE deficits/detail LLE Deficits / Details: left leg with normal post op pain and weakness.  Ankle at least 3/5, knee 3-/5, hip 1/5 LLE Sensation: decreased light touch (pt reporting intermittent foot numbness and thigh  humbness)    Cervical / Trunk Assessment Cervical / Trunk Assessment: Normal  Communication   Communication: Deaf (uses sign language interpreter)  Cognition Arousal/Alertness: Awake/alert Behavior During Therapy: WFL for tasks assessed/performed Overall Cognitive Status: Within Functional Limits for tasks assessed                                            Exercises Total Joint Exercises Ankle Circles/Pumps: AROM;Both;20 reps Quad Sets: AROM;Left;10 reps Heel Slides: AAROM;Left;10 reps Hip ABduction/ADduction: AAROM;Left;10 reps   Assessment/Plan    PT Assessment Patient needs continued PT services  PT Problem List Decreased strength;Decreased range of motion;Decreased activity tolerance;Decreased balance;Decreased mobility;Decreased knowledge of use of DME;Pain       PT Treatment Interventions DME instruction;Gait training;Stair training;Functional mobility training;Therapeutic activities;Therapeutic exercise;Balance training;Patient/family education;Modalities    PT Goals (Current goals can be found in the Care Plan section)  Acute Rehab PT Goals Patient Stated Goal: to get back to walking pain free PT Goal Formulation: With patient Time For Goal Achievement: 03/01/17 Potential to Achieve Goals: Good    Frequency 7X/week           End of Session Equipment Utilized During Treatment: Gait belt Activity Tolerance: Patient limited by pain Patient left: in chair;with call bell/phone within reach   PT Visit Diagnosis: Muscle weakness (generalized) (M62.81);Difficulty in walking, not elsewhere classified (R26.2);Pain Pain - Right/Left: Left Pain - part of body: Hip    Time: 1962-2297 PT Time Calculation (min) (ACUTE ONLY): 29 min   Charges:   PT Evaluation $PT Eval Moderate Complexity: 1 Procedure PT Treatments $Therapeutic Activity: 8-22 mins       Estiven Kohan B. [place], [place], DPT [phone]   02/22/2017, 10:23 PM

## 2017-02-22 NOTE — Progress Notes (Signed)
   Subjective:  Patient reports pain as moderate.  No events.  Objective:   VITALS:   Vitals:   02/21/17 1825 02/21/17 1835 02/21/17 2039 02/22/17 0620  BP:   (!) 101/48 (!) 91/45  Pulse:   93 88  Resp:   18 16  Temp:   97.6 F (36.4 C) 98.6 F (37 C)  TempSrc:   Axillary Oral  SpO2: (!) 89% 95% 97% 96%  Weight:        Neurologically intact Neurovascular intact Sensation intact distally Intact pulses distally Dorsiflexion/Plantar flexion intact Incision: dressing C/D/I and no drainage No cellulitis present Compartment soft   Lab Results  Component Value Date   WBC 9.2 02/22/2017   HGB 11.4 (L) 02/22/2017   HCT 35.9 (L) 02/22/2017   MCV 84.5 02/22/2017   PLT 208 02/22/2017     Assessment/Plan:  1 Day Post-Op   - Expected postop acute blood loss anemia - will monitor for symptoms - Up with PT/OT - DVT ppx - SCDs, ambulation, aspirin - WBAT operative extremity - Pain control - anticipate home with HHPT  Eduard Roux 02/22/2017, 7:42 AM [phone]

## 2017-02-23 LAB — GLUCOSE, CAPILLARY
GLUCOSE-CAPILLARY: 127 mg/dL — AB (ref 65–99)
GLUCOSE-CAPILLARY: 120 mg/dL — AB (ref 65–99)
GLUCOSE-CAPILLARY: 173 mg/dL — AB (ref 65–99)
Glucose-Capillary: 140 mg/dL — ABNORMAL HIGH (ref 65–99)

## 2017-02-23 MED ORDER — GABAPENTIN 100 MG PO CAPS
100.0000 mg | ORAL_CAPSULE | Freq: Three times a day (TID) | ORAL | Status: DC
  Administered 2017-02-23 – 2017-02-26 (×11): 100 mg via ORAL
  Filled 2017-02-23 (×10): qty 1

## 2017-02-23 NOTE — Progress Notes (Signed)
Called communication board regarding interpreting for tomorrow with discharge

## 2017-02-23 NOTE — Progress Notes (Signed)
Physical Therapy Treatment Patient Details Name: Rhonda Morrow MRN: [id number] DOB: 20-Jul-1961 Today's Date: 02/23/2017    History of Present Illness 56 y.o. female admitted to [facility] on 02/21/17 for elective direct anterior THA.  Pt with significant PMHx of HTN, DM, and deaf.    PT Comments    Pt is awoken from a pretty deep sleep at the initiation of therapy and requires a couple minutes to wake up before starting. Pt requires a slight increase in assistance to perform stand pivot transfers and gait this session due to increased complaints of burning pain in her right hip and knee. RN notified that pt is requesting pain medication. Pt continues to benefit from SNF at discharge due to multiple stairs going up to her room and lack of assistance available when she returns home.     Follow Up Recommendations  SNF;Supervision/Assistance - 24 hour     Equipment Recommendations  Rolling walker with 5" wheels;3in1 (PT)    Recommendations for Other Services       Precautions / Restrictions Precautions Precautions: None Restrictions Weight Bearing Restrictions: Yes LLE Weight Bearing: Weight bearing as tolerated    Mobility  Bed Mobility               General bed mobility comments: pt in recliner when PT arrives  Transfers Overall transfer level: Needs assistance Equipment used: Rolling walker (2 wheeled) Transfers: Sit to/from [facility] Sit to Stand: Min assist;Mod assist Stand pivot transfers: Min assist       General transfer comment: Min to Mod A to stand from recliner and from lower bench. Manual cues for hand placement. Min A to transfer from recliner <> BSC x2   Ambulation/Gait Ambulation/Gait assistance: Min assist Ambulation Distance (Feet): 50 Feet (50x2 with seated rest break) Assistive device: Rolling walker (2 wheeled) Gait Pattern/deviations: Step-to pattern;Antalgic Gait velocity: decreased Gait velocity interpretation: Below normal speed  for age/gender General Gait Details: Verbal and tactile cues for upright posture and safe RW use.  Pt took one seated rest break and then was able to continue.    Stairs            Wheelchair Mobility    Modified Rankin (Stroke Patients Only)       Balance Overall balance assessment: Needs assistance Sitting-balance support: Feet supported;No upper extremity supported Sitting balance-Leahy Scale: Good     Standing balance support: No upper extremity supported;During functional activity Standing balance-Leahy Scale: Fair Standing balance comment: able to stand at [employer] and sign to interpreter briefly                            Cognition Arousal/Alertness: Awake/alert Behavior During Therapy: WFL for tasks assessed/performed Overall Cognitive Status: Within Functional Limits for tasks assessed                                        Exercises      General Comments        Pertinent Vitals/Pain Pain Assessment: Faces Faces Pain Scale: Hurts whole lot Pain Location: left anterior thigh and knee Pain Descriptors / Indicators: Burning;Constant Pain Intervention(s): Monitored during session;Patient requesting pain meds-RN notified;Ice applied    Home Living                      Prior Function  PT Goals (current goals can now be found in the care plan section) Acute Rehab PT Goals Patient Stated Goal: to get back to walking pain free Progress towards PT goals: Progressing toward goals    Frequency    7X/week      PT Plan Current plan remains appropriate    Co-evaluation             End of Session Equipment Utilized During Treatment: Gait belt Activity Tolerance: Patient limited by pain Patient left: in chair;with call bell/phone within reach;with family/visitor present Nurse Communication: Patient requests pain meds PT Visit Diagnosis: Muscle weakness (generalized) (M62.81);Difficulty in walking, not  elsewhere classified (R26.2);Pain Pain - Right/Left: Left Pain - part of body: Hip     Time: 9798-9211 PT Time Calculation (min) (ACUTE ONLY): 47 min  Charges:  $Gait Training: 23-37 mins $Therapeutic Activity: 8-22 mins                    G Codes:       Scheryl Marten PT, DPT  [phone]    Shanon Rosser 02/23/2017, 5:12 PM

## 2017-02-23 NOTE — Progress Notes (Signed)
02/23/17 1133  PT Visit Information  Last PT Received On 02/23/17  Assistance Needed +1  History of Present Illness 56 y.o. female admitted to [facility] on 02/21/17 for elective direct anterior THA.  Pt with significant PMHx of HTN, DM, and deaf.  Subjective Data  Subjective pt states that she has a lot of burning in her left knee.   MD made aware when PT paged re: d/c plan change  Patient Stated Goal to get back to walking pain free  Precautions  Precautions None  Restrictions  LLE Weight Bearing WBAT  Pain Assessment  Pain Assessment Faces  Faces Pain Scale 8  Pain Location lateral thigh and knee area  Pain Descriptors / Indicators Burning;Constant  Pain Intervention(s) Limited activity within patient's tolerance;Monitored during session;Repositioned;Ice applied  Cognition  Arousal/Alertness Awake/alert  Behavior During Therapy WFL for tasks assessed/performed  Overall Cognitive Status Within Functional Limits for tasks assessed  Bed Mobility  General bed mobility comments Pt OOB in the recliner chair when PT entered the room.   Transfers  Overall transfer level Needs assistance  Equipment used Rolling walker (2 wheeled)  Transfers Sit to/from Stand  Sit to Stand Mod assist  General transfer comment Up to mod assist to get up from low bench without rails. Min assist from higher recliner chair with verbal/tactile cues for safe hand placement.   Ambulation/Gait  Ambulation/Gait assistance Min assist  Ambulation Distance (Feet) 100 Feet (two seated rest breaks)  Assistive device Rolling walker (2 wheeled)  Gait Pattern/deviations Step-to pattern;Antalgic  General Gait Details Pt much more antalgic and buckling over left leg today.  She reported a burning in her thigh.  MD made aware.   Gait velocity decreased  Gait velocity interpretation Below normal speed for age/gender  Stairs Yes  Stairs assistance Min assist  Stair Management One rail Right;Step to pattern;Forwards  Number of  Stairs 4  General stair comments Heavy min assist to ensure safety on stairs with pt pulling with both hands on right handrail.  She had significant difficulty getting up and down and actually felt better going down with her right foot first vs what we teach people which is down with her sore, or left foot first.  HR went up to 135 from the low 100s and pt was DOE on the stairs.  2-3/4 requring an additional seated rest break.   Balance  Overall balance assessment Needs assistance  Sitting-balance support Feet supported  Sitting balance-Leahy Scale Good  Standing balance support No upper extremity supported  Standing balance-Leahy Scale Fair  Standing balance comment static standing is stable for short periods of time, but any dynamic movement requires min to mod assist.   General Comments  General comments (skin integrity, edema, etc.) I discussed at length pt's increased difficulty with mobility and significant difficulty just doing 4 steps today.  She doesn't have anyone at her home that can physically help her and has disclosed to me that she has a recent h/o falling.  She is open to pursue SNF for rehab before returning home to an elderly mother and a disabled husband.  MD and RN made aware of change in recs.   Exercises  Exercises Total Joint  Total Joint Exercises  Long Arc Quad AROM;Both;10 reps;AAROM  PT - End of Session  Equipment Utilized During Treatment Gait belt  Activity Tolerance Patient limited by pain  Patient left in chair;with call bell/phone within reach;with family/visitor present  PT - Assessment/Plan  PT Plan Discharge plan needs  to be updated  PT Visit Diagnosis Muscle weakness (generalized) (M62.81);Difficulty in walking, not elsewhere classified (R26.2);Pain  Pain - Right/Left Left  Pain - part of body Hip  PT Frequency (ACUTE ONLY) 7X/week  Follow Up Recommendations SNF;Supervision/Assistance - 24 hour  PT equipment Rolling walker with 5" wheels  AM-PAC PT "6  Clicks" Daily Activity Outcome Measure  Difficulty turning over in bed (including adjusting bedclothes, sheets and blankets)? 1  Difficulty moving from lying on back to sitting on the side of the bed?  1  Difficulty sitting down on and standing up from a chair with arms (e.g., wheelchair, bedside commode, etc,.)? 1  Help needed moving to and from a bed to chair (including a wheelchair)? 2  Help needed walking in hospital room? 3  Help needed climbing 3-5 steps with a railing?  2  6 Click Score 10  Mobility G Code  CL  PT Goal Progression  Progress towards PT goals Progressing toward goals  PT Time Calculation  PT Start Time (ACUTE ONLY) 1030  PT Stop Time (ACUTE ONLY) 1133  PT Time Calculation (min) (ACUTE ONLY) 63 min  PT General Charges  $$ ACUTE PT VISIT 1 Procedure  PT Treatments  $Gait Training 38-52 mins  $Therapeutic Activity 8-22 mins  02/23/2017 Pt is POD #2 and is moving worse today than yesterday, with increased pain and burning in her left thigh during gait (significant increase in left leg instability during stance phase of gait), HR increase from 100s-135 bpm during mobility/stairs, and increased DOE 2-3/4 with O2 sats stable on RA (94-96% even with DOE).  Pt would struggle to go home and try to care for herself like this.  PT now recommending SNF level rehab before returning home.  Barbarann Ehlers [place], [place], DPT [phone]

## 2017-02-24 ENCOUNTER — Inpatient Hospital Stay (HOSPITAL_COMMUNITY): Payer: Federal, State, Local not specified - PPO

## 2017-02-24 DIAGNOSIS — Z9889 Other specified postprocedural states: Secondary | ICD-10-CM

## 2017-02-24 LAB — GLUCOSE, CAPILLARY
GLUCOSE-CAPILLARY: 106 mg/dL — AB (ref 65–99)
GLUCOSE-CAPILLARY: 165 mg/dL — AB (ref 65–99)
Glucose-Capillary: 105 mg/dL — ABNORMAL HIGH (ref 65–99)
Glucose-Capillary: 134 mg/dL — ABNORMAL HIGH (ref 65–99)

## 2017-02-24 MED ORDER — INSULIN ASPART 100 UNIT/ML ~~LOC~~ SOLN
0.0000 [IU] | Freq: Three times a day (TID) | SUBCUTANEOUS | Status: DC
  Administered 2017-02-25 (×2): 3 [IU] via SUBCUTANEOUS

## 2017-02-24 NOTE — Progress Notes (Signed)
Physical Therapy Treatment Patient Details Name: Rhonda Morrow MRN: [id number] DOB: 1960-11-27 Today's Date: 02/24/2017    History of Present Illness 56 y.o. female admitted to [facility] on 02/21/17 for elective direct anterior THA.  Pt with significant PMHx of HTN, DM, and deaf.    PT Comments    Pt demonstrates improvement in gait and transfers this session with improved cadence and sequencing. Pt is able to perform stair negotiation with min guard assist with reliance on railing, but overall improved sequencing. Pt may be able to progress to discharging home if she continues to make progress tomorrow. Will retry stair negotiation.     Follow Up Recommendations  SNF;Supervision/Assistance - 24 hour;Other (comment) (May be progressing to home if can pass stairs tomorrow)     Equipment Recommendations  Rolling walker with 5" wheels    Recommendations for Other Services       Precautions / Restrictions Precautions Precautions: None Restrictions Weight Bearing Restrictions: Yes LLE Weight Bearing: Weight bearing as tolerated    Mobility  Bed Mobility               General bed mobility comments: Pt OOB in the recliner chair when PT entered the room.   Transfers Overall transfer level: Needs assistance Equipment used: Rolling walker (2 wheeled) Transfers: Sit to/from Stand Sit to Stand: Min assist;Min guard         General transfer comment: Min A this session from recliner and from bench. pt is able to stand with Min guard once in room to remove gown  Ambulation/Gait Ambulation/Gait assistance: Min guard Ambulation Distance (Feet): 100 Feet (1 seated rest break) Assistive device: Rolling walker (2 wheeled) Gait Pattern/deviations: Step-through pattern;Antalgic Gait velocity: decreased Gait velocity interpretation: Below normal speed for age/gender General Gait Details: minimal antalgia noted this session with improved sequencing   Stairs Stairs: Yes   Stair  Management: One rail Right;Step to pattern;Forwards Number of Stairs: 12 General stair comments: Min guard this session with cues for using both hand on railing. Pt requires min cues for sequencing, but is able to perform with decreased assistance and actually approves to supervision as she continues.   Wheelchair Mobility    Modified Rankin (Stroke Patients Only)       Balance Overall balance assessment: Needs assistance Sitting-balance support: Feet supported Sitting balance-Leahy Scale: Good     Standing balance support: No upper extremity supported Standing balance-Leahy Scale: Fair Standing balance comment: static standing is stable for short periods of time, but any dynamic movement requires min to mod assist.                             Cognition Arousal/Alertness: Awake/alert Behavior During Therapy: WFL for tasks assessed/performed Overall Cognitive Status: Within Functional Limits for tasks assessed                                        Exercises Total Joint Exercises Ankle Circles/Pumps: AROM;Both;20 reps Quad Sets: AROM;Left;10 reps Heel Slides: AAROM;Left;10 reps Hip ABduction/ADduction: AAROM;Left;10 reps    General Comments        Pertinent Vitals/Pain Pain Assessment: 0-10 Pain Score: 4  Pain Location: lateral thigh and knee area Pain Descriptors / Indicators: Burning;Constant Pain Intervention(s): Monitored during session;Premedicated before session;Repositioned;Ice applied    Home Living  Prior Function            PT Goals (current goals can now be found in the care plan section) Acute Rehab PT Goals Patient Stated Goal: to get back to walking pain free Progress towards PT goals: Progressing toward goals    Frequency    7X/week      PT Plan Current plan remains appropriate    Co-evaluation             End of Session Equipment Utilized During Treatment: Gait  belt Activity Tolerance: Patient tolerated treatment well Patient left: in chair;with call bell/phone within reach;with family/visitor present Nurse Communication: Mobility status PT Visit Diagnosis: Muscle weakness (generalized) (M62.81);Difficulty in walking, not elsewhere classified (R26.2);Pain Pain - Right/Left: Left Pain - part of body: Hip     Time: 9741-6384 PT Time Calculation (min) (ACUTE ONLY): 46 min  Charges:  $Gait Training: 23-37 mins $Therapeutic Exercise: 8-22 mins                    G Codes:       Scheryl Marten PT, DPT  [phone]    Jacqulyn Liner Sloan Leiter 02/24/2017, 2:45 PM

## 2017-02-24 NOTE — Progress Notes (Signed)
*  PRELIMINARY RESULTS* Vascular Ultrasound Left lower extremity venous duplex has been completed.  Preliminary findings: No evidence of deep vein thrombosis or baker's cysts in the left lower extremity.   Everrett Coombe 02/24/2017, 11:43 AM

## 2017-02-24 NOTE — Progress Notes (Signed)
Subjective: 3 Days Post-Op Procedure(s) (LRB): LEFT TOTAL HIP ARTHROPLASTY ANTERIOR APPROACH (Left) Patient reports pain as mild and moderate.    Objective: Vital signs in last 24 hours: Temp:  [98.7 F (37.1 C)-99.5 F (37.5 C)] 99.3 F (37.4 C) (04/21 0410) Pulse Rate:  [90-109] 90 (04/21 0911) Resp:  [15-16] 16 (04/21 0843) BP: (115-148)/(50-80) 121/50 (04/21 0911) SpO2:  [94 %-98 %] 94 % (04/21 0911)  Intake/Output from previous day: 04/20 0701 - 04/21 0700 In: 1444.8 [P.O.:480; I.V.:964.8] Out: 750 [Urine:750] Intake/Output this shift: No intake/output data recorded.   Recent Labs  02/22/17 0517  HGB 11.4*    Recent Labs  02/22/17 0517  WBC 9.2  RBC 4.25  HCT 35.9*  PLT 208    Recent Labs  02/22/17 0517  NA 137  K 3.9  CL 103  CO2 25  BUN 23*  CREATININE 1.30*  GLUCOSE 107*  CALCIUM 8.5*   No results for input(s): LABPT, INR in the last 72 hours.  Neurologically intact Neurovascular intact Sensation intact distally dressing dry and intact.  calf pain to palpation left  Assessment/Plan: 3 Days Post-Op Procedure(s) (LRB): LEFT TOTAL HIP ARTHROPLASTY ANTERIOR APPROACH (Left) Advance diet Up with therapy Discharge to SNF when bed available.  Will consult Social Service to start process.  Her family is unable at this time to care for her and they apparently have URI Doppler to evaluate calf pain  Biagio Borg 02/24/2017, 10:07 AM

## 2017-02-25 ENCOUNTER — Other Ambulatory Visit: Payer: Self-pay | Admitting: Medical

## 2017-02-25 LAB — GLUCOSE, CAPILLARY
GLUCOSE-CAPILLARY: 122 mg/dL — AB (ref 65–99)
GLUCOSE-CAPILLARY: 98 mg/dL (ref 65–99)
Glucose-Capillary: 120 mg/dL — ABNORMAL HIGH (ref 65–99)
Glucose-Capillary: 140 mg/dL — ABNORMAL HIGH (ref 65–99)

## 2017-02-25 NOTE — Progress Notes (Signed)
Physical Therapy Treatment Patient Details Name: Rhonda Morrow MRN: [id number] DOB: 1961/09/05 Today's Date: 02/25/2017    History of Present Illness 56 y.o. female admitted to [facility] on 02/21/17 for elective direct anterior THA.  Pt with significant PMHx of HTN, DM, and deaf.    PT Comments    Pt is POD 4 and continues to be progressing well with therapy. Pt is able to perform all mobility at an independent to mod I level this session. Pt performed bed mobs x 3 without assistance, transfers, and stair negotiation with improved tolerance. Pt and family updated that the pt would definitely be appropriate to return home at this time due to significant progress.     Follow Up Recommendations  Home health PT     Equipment Recommendations  Rolling walker with 5" wheels    Recommendations for Other Services       Precautions / Restrictions Precautions Precautions: None Restrictions Weight Bearing Restrictions: Yes LLE Weight Bearing: Weight bearing as tolerated    Mobility  Bed Mobility Overal bed mobility: Independent Bed Mobility: Sit to Supine;Supine to Sit     Supine to sit: Independent Sit to supine: Independent   General bed mobility comments: Pt able to get into and out of bed without railings and HOB flat. Pt performed x 3  Transfers Overall transfer level: Modified independent Equipment used: Rolling walker (2 wheeled) Transfers: Sit to/from [facility] Sit to Stand: Modified independent (Device/Increase time) Stand pivot transfers: Modified independent (Device/Increase time)       General transfer comment: Pt able to perform stand pivot transfers from recliner to [facility] and back with MOD I and from EOB and recliner with Mod I this session.   Ambulation/Gait Ambulation/Gait assistance: Modified independent (Device/Increase time) Ambulation Distance (Feet): 100 Feet (1 seated rest break) Assistive device: Rolling walker (2 wheeled) Gait  Pattern/deviations: Step-through pattern;Antalgic Gait velocity: decreased, improved Gait velocity interpretation: Below normal speed for age/gender General Gait Details: minimal antalgia noted this session with improved sequencing   Stairs Stairs: Yes   Stair Management: One rail Right;Step to pattern;Forwards Number of Stairs: 12 General stair comments: Supervision this session with improved ability to use 1 hand on railing and decreased reliance on both UE's.   Wheelchair Mobility    Modified Rankin (Stroke Patients Only)       Balance                                            Cognition Arousal/Alertness: Awake/alert Behavior During Therapy: WFL for tasks assessed/performed Overall Cognitive Status: Within Functional Limits for tasks assessed                                        Exercises      General Comments General comments (skin integrity, edema, etc.): Spoke with pt's mother and reported update on the pt's functional status. Family is relieved that pt is performing better and are encouraged about her possibly returning home. Visit was performed with the assistance of an interpreter      Pertinent Vitals/Pain Pain Assessment: Faces Faces Pain Scale: Hurts little more Pain Location: lateral thigh and knee area Pain Descriptors / Indicators: Burning;Constant Pain Intervention(s): Monitored during session;Premedicated before session;Repositioned    Home Living  Prior Function            PT Goals (current goals can now be found in the care plan section) Acute Rehab PT Goals Patient Stated Goal: to get back to walking pain free Progress towards PT goals: Progressing toward goals    Frequency    7X/week      PT Plan Discharge plan needs to be updated    Co-evaluation             End of Session Equipment Utilized During Treatment: Gait belt Activity Tolerance: Patient tolerated  treatment well Patient left: in chair;with call bell/phone within reach;with family/visitor present;Other (comment) (interpreter) Nurse Communication: Mobility status PT Visit Diagnosis: Muscle weakness (generalized) (M62.81);Difficulty in walking, not elsewhere classified (R26.2);Pain Pain - Right/Left: Left Pain - part of body: Hip     Time: 0923-3007 PT Time Calculation (min) (ACUTE ONLY): 61 min  Charges:  $Gait Training: 23-37 mins $Therapeutic Activity: 23-37 mins                    G Codes:       Scheryl Marten PT, DPT  [phone]    Jacqulyn Liner Sloan Leiter 02/25/2017, 1:18 PM

## 2017-02-25 NOTE — Progress Notes (Signed)
Subjective: 4 Days Post-Op Procedure(s) (LRB): LEFT TOTAL HIP ARTHROPLASTY ANTERIOR APPROACH (Left) Patient reports pain as moderate.  Able to work with therapy who have recommended skilled nursing placement.  Objective: Vital signs in last 24 hours: Temp:  [98 F (36.7 C)-98.9 F (37.2 C)] 98 F (36.7 C) (04/22 0448) Pulse Rate:  [83-101] 97 (04/22 0448) Resp:  [16-18] 16 (04/22 0448) BP: (121-130)/(50-68) 127/66 (04/22 0448) SpO2:  [94 %-100 %] 98 % (04/22 0448)  Intake/Output from previous day: 04/21 0701 - 04/22 0700 In: 480 [P.O.:480] Out: -  Intake/Output this shift: No intake/output data recorded.  No results for input(s): HGB in the last 72 hours. No results for input(s): WBC, RBC, HCT, PLT in the last 72 hours. No results for input(s): NA, K, CL, CO2, BUN, CREATININE, GLUCOSE, CALCIUM in the last 72 hours. No results for input(s): LABPT, INR in the last 72 hours.  Intact pulses distally Dorsiflexion/Plantar flexion intact Incision: dressing C/D/I  Assessment/Plan: 4 Days Post-Op Procedure(s) (LRB): LEFT TOTAL HIP ARTHROPLASTY ANTERIOR APPROACH (Left) Up with therapy Discharge to SNF next 1-2 days.  Mcarthur Rossetti 02/25/2017, 8:03 AM

## 2017-02-26 LAB — GLUCOSE, CAPILLARY
Glucose-Capillary: 106 mg/dL — ABNORMAL HIGH (ref 65–99)
Glucose-Capillary: 116 mg/dL — ABNORMAL HIGH (ref 65–99)
Glucose-Capillary: 104 mg/dL — ABNORMAL HIGH (ref 65–99)
Glucose-Capillary: 127 mg/dL — ABNORMAL HIGH (ref 65–99)

## 2017-02-26 NOTE — Progress Notes (Signed)
Discharge instructions and RX reviewed with pt using sign language interpreter at this time with verb understanding. Pt waiting on son to get off work to provide her a ride home.

## 2017-02-26 NOTE — Discharge Summary (Signed)
Physician Discharge Summary      Patient ID: [name] MRN: [id number] DOB/AGE: [birth year] 56 y.o.  Admit date: 02/21/2017 Discharge date: 02/26/2017  Admission Diagnoses:  Primary osteoarthritis of left hip  Discharge Diagnoses:  Principal Problem:   Primary osteoarthritis of left hip Active Problems:   Status post total hip replacement, left   Past Medical History:  Diagnosis Date  . Arthritis   . Asthma   . Complication of anesthesia    one time woke up and was vey scared,17 yrs ago  . Deaf   . Diabetes mellitus without complication (Rhonda Morrow)   . GERD (gastroesophageal reflux disease)   . Hypertension   . Left groin pain   . Thyroid disease     Surgeries: Procedure(s): LEFT TOTAL HIP ARTHROPLASTY ANTERIOR APPROACH on 02/21/2017   Consultants (if any):   Discharged Condition: Improved  Hospital Course: Rhonda Morrow is an 56 y.o. female who was admitted 02/21/2017 with a diagnosis of Primary osteoarthritis of left hip and went to the operating room on 02/21/2017 and underwent the above named procedures.    She was given perioperative antibiotics:  Anti-infectives    Start     Dose/Rate Route Frequency Ordered Stop   02/21/17 2200  ceFAZolin (ANCEF) 3 g in dextrose 5 % 50 mL IVPB     3 g 130 mL/hr over 30 Minutes Intravenous Every 8 hours 02/21/17 1633 02/22/17 1530   02/21/17 2200  sulfamethoxazole-trimethoprim (BACTRIM DS,SEPTRA DS) 800-160 MG per tablet 1 tablet     1 tablet Oral Every 12 hours 02/21/17 1737     02/21/17 2000  ciprofloxacin (CIPRO) tablet 500 mg  Status:  Discontinued     500 mg Oral 2 times daily 02/21/17 1737 02/21/17 1814   02/21/17 1745  metroNIDAZOLE (FLAGYL) tablet 500 mg  Status:  Discontinued     500 mg Oral 3 times daily 02/21/17 1737 02/21/17 1814   02/21/17 1037  clindamycin (CLEOCIN) IVPB 900 mg  Status:  Discontinued     900 mg 100 mL/hr over 30 Minutes Intravenous On call to O.R. 02/21/17 1037 02/21/17 1737   02/21/17 0000   sulfamethoxazole-trimethoprim (BACTRIM DS,SEPTRA DS) 800-160 MG tablet     1 tablet Oral 2 times daily 02/21/17 1604      .  She was given sequential compression devices, early ambulation, and aspirin for DVT prophylaxis.  She benefited maximally from the hospital stay and there were no complications.    Recent vital signs:  Vitals:   02/25/17 1704 02/25/17 2036  BP: (!) 121/55 (!) 118/53  Pulse: 84 92  Resp: 16 16  Temp: 98.4 F (36.9 C) 98.7 F (37.1 C)    Recent laboratory studies:  Lab Results  Component Value Date   HGB 11.4 (L) 02/22/2017   HGB 13.3 02/12/2017   HGB 13.4 02/08/2017   Lab Results  Component Value Date   WBC 9.2 02/22/2017   PLT 208 02/22/2017   Lab Results  Component Value Date   INR 1.05 02/12/2017   Lab Results  Component Value Date   NA 137 02/22/2017   K 3.9 02/22/2017   CL 103 02/22/2017   CO2 25 02/22/2017   BUN 23 (H) 02/22/2017   CREATININE 1.30 (H) 02/22/2017   GLUCOSE 107 (H) 02/22/2017    Discharge Medications:   Allergies as of 02/26/2017      Reactions   Losartan Shortness Of Breath   Oxycodone-acetaminophen Nausea And Vomiting   Augmentin [amoxicillin-pot Clavulanate] Diarrhea  Medication List    TAKE these medications   AEROCHAMBER MV inhaler Use as instructed   albuterol 108 (90 Base) MCG/ACT inhaler Commonly known as:  PROVENTIL HFA;VENTOLIN HFA Inhale 2 puffs into the lungs every 6 (six) hours as needed for wheezing.   amLODipine 5 MG tablet Commonly known as:  NORVASC TAKE 1 TABLET (5 MG TOTAL) BY MOUTH DAILY.   aspirin EC 325 MG tablet Take 1 tablet (325 mg total) by mouth 2 (two) times daily.   azelastine 0.1 % nasal spray Commonly known as:  ASTELIN Place 2 sprays into both nostrils 2 (two) times daily. Use in each nostril as directed   cetirizine 10 MG tablet Commonly known as:  ZYRTEC Take 1 tablet (10 mg total) by mouth daily.   ciprofloxacin 500 MG tablet Commonly known as:  CIPRO Take  1 tablet (500 mg total) by mouth 2 (two) times daily.   cyclobenzaprine 5 MG tablet Commonly known as:  FLEXERIL Take 1 tablet (5 mg total) by mouth at bedtime as needed for muscle spasms.   diazepam 5 MG tablet Commonly known as:  VALIUM Take 1 tablet 30-45 minutes prior to procedure.  May repeat x1.   Fiber Chew Chew 1 tablet by mouth daily.   fluticasone 50 MCG/ACT nasal spray Commonly known as:  FLONASE PLACE 2 SPRAYS INTO BOTH NOSTRILS DAILY.   glucose blood test strip Use as instructed--- one touch verio test strips   hydrochlorothiazide 12.5 MG tablet Commonly known as:  HYDRODIURIL Take 1 tablet (12.5 mg total) by mouth daily.   HYDROcodone-acetaminophen 7.5-325 MG tablet Commonly known as:  NORCO Take 1-2 tablets by mouth every 6 (six) hours as needed for moderate pain.   HYDROmorphone 2 MG tablet Commonly known as:  DILAUDID Take 1 tablet (2 mg total) by mouth every 4 (four) hours as needed for severe pain.   ipratropium-albuterol 0.5-2.5 (3) MG/3ML Soln Commonly known as:  DUONEB Take 3 mLs by nebulization every 6 (six) hours as needed. What changed:  reasons to take this   KLOR-CON M20 20 MEQ tablet Generic drug:  potassium chloride SA TAKE 1 TABLET (20 MEQ TOTAL) BY MOUTH DAILY.   meclizine 25 MG tablet Commonly known as:  ANTIVERT Take 1 tablet (25 mg total) by mouth 3 (three) times daily as needed.   meloxicam 15 MG tablet Commonly known as:  MOBIC 1/2-1 po qd prn pain What changed:  how much to take  how to take this  when to take this  reasons to take this  additional instructions   metFORMIN 500 MG 24 hr tablet Commonly known as:  GLUCOPHAGE-XR TAKE 1 TABLET (500 MG TOTAL) BY MOUTH DAILY WITH BREAKFAST.   methocarbamol 750 MG tablet Commonly known as:  ROBAXIN Take 1 tablet (750 mg total) by mouth 2 (two) times daily as needed for muscle spasms.   metroNIDAZOLE 500 MG tablet Commonly known as:  FLAGYL Take 1 tablet (500 mg total)  by mouth 3 (three) times daily.   mometasone-formoterol 100-5 MCG/ACT Aero Commonly known as:  DULERA Inhale 2 puffs into the lungs 2 (two) times daily.   montelukast 10 MG tablet Commonly known as:  SINGULAIR Take 1 tablet (10 mg total) by mouth at bedtime.   multivitamin tablet Take 1 tablet by mouth daily.   NONFORMULARY OR COMPOUNDED ITEM Nebulizer   DX ASTHMA   omeprazole 20 MG capsule Commonly known as:  PRILOSEC Take 1 capsule (20 mg total) by mouth daily.  ondansetron 4 MG tablet Commonly known as:  ZOFRAN Take 1 tablet (4 mg total) by mouth every 8 (eight) hours as needed for nausea or vomiting.   promethazine 25 MG tablet Commonly known as:  PHENERGAN Take 1 tablet (25 mg total) by mouth every 6 (six) hours as needed for nausea.   ranitidine 150 MG capsule Commonly known as:  ZANTAC Take 1 capsule (150 mg total) by mouth 2 (two) times daily.   senna-docusate 8.6-50 MG tablet Commonly known as:  SENOKOT S Take 1 tablet by mouth at bedtime as needed.   sulfamethoxazole-trimethoprim 800-160 MG tablet Commonly known as:  BACTRIM DS,SEPTRA DS Take 1 tablet by mouth 2 (two) times daily.   traMADol 50 MG tablet Commonly known as:  ULTRAM TAKE 1 TABLET BY MOUTH AT BEDTIME AS NEEDED   zolpidem 10 MG tablet Commonly known as:  AMBIEN Take 1 tablet (10 mg total) by mouth at bedtime as needed for sleep.            Durable Medical Equipment        Start     Ordered   02/21/17 1738  DME Walker rolling  Once    Question:  Patient needs a walker to treat with the following condition  Answer:  History of hip replacement   02/21/17 1737   02/21/17 1738  DME 3 n 1  Once     02/21/17 1737   02/21/17 1738  DME Bedside commode  Once    Question:  Patient needs a bedside commode to treat with the following condition  Answer:  History of hip replacement   02/21/17 1737      Diagnostic Studies: Ct Abdomen Pelvis W Contrast  Result Date: 02/08/2017 CLINICAL  DATA:  Suprapubic abdominal and rectal pain. Diarrhea with bloody stool. EXAM: CT ABDOMEN AND PELVIS WITH CONTRAST TECHNIQUE: Multidetector CT imaging of the abdomen and pelvis was performed using the standard protocol following bolus administration of intravenous contrast. CONTRAST:  131mL ISOVUE-300 IOPAMIDOL (ISOVUE-300) INJECTION 61% COMPARISON:  09/01/2015 FINDINGS: Lower chest:  Heart is enlarged. Hepatobiliary: No focal abnormality within the liver parenchyma. Gallbladder surgically absent. No intrahepatic or extrahepatic biliary dilation. Pancreas: No focal mass lesion. No dilatation of the main duct. No intraparenchymal cyst. No peripancreatic edema. Spleen: No splenomegaly. No focal mass lesion. Adrenals/Urinary Tract: No adrenal nodule or mass. Kidneys are unremarkable. No evidence for hydroureter. The urinary bladder appears normal for the degree of distention. Stomach/Bowel: Stomach is nondistended. No gastric wall thickening. No evidence of outlet obstruction. Duodenum is normally positioned as is the ligament of Treitz. No small bowel wall thickening. No small bowel dilatation. The terminal ileum is normal. The appendix is normal. No gross colonic mass. No colonic wall thickening. No substantial diverticular change. Vascular/Lymphatic: No abdominal aortic aneurysm. No abdominal aortic atherosclerotic calcification. There is no gastrohepatic or hepatoduodenal ligament lymphadenopathy. No intraperitoneal or retroperitoneal lymphadenopathy. No pelvic sidewall lymphadenopathy. Reproductive: The uterus has normal CT imaging appearance. There is no adnexal mass. Other: No intraperitoneal free fluid. 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 Bone windows reveal no worrisome lytic or sclerotic osseous lesions. IMPRESSION: 1. No acute findings in the abdomen or pelvis. 2. 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 Electronically Signed   By: Misty Stanley M.D.   On: 02/08/2017 18:21   Dg Pelvis Portable  Result Date: 02/21/2017 CLINICAL  DATA:  56 y/o  F; status post hip joint replacement. EXAM: PORTABLE PELVIS 1-2 VIEWS COMPARISON:  02/08/2017 CT abdomen and pelvis FINDINGS: Total left hip arthroplasty in good alignment. No apparent hardware related complication. Air and edema in the left hip soft tissues compatible postsurgical changes. No acute fracture identified IMPRESSION: Total left hip arthroplasty.  Expected postsurgical changes. Electronically Signed   By: Kristine Garbe M.D.   On: 02/21/2017 18:53   Dg C-arm 1-60 Min  Result Date: 02/21/2017 CLINICAL DATA:  Post left hip arthroplasty EXAM: DG C-ARM 61-120 MIN; OPERATIVE LEFT HIP WITH PELVIS COMPARISON:  12/04/2016 FINDINGS: Two views of the left hip submitted. There is left hip prosthesis with anatomic alignment. IMPRESSION: Left hip prosthesis with anatomic alignment. Fluoroscopy time was 41 seconds. Please see the operative report. Electronically Signed   By: Lahoma Crocker M.D.   On: 02/21/2017 16:43   Dg Hip Operative Unilat W Or W/o Pelvis Left  Result Date: 02/21/2017 CLINICAL DATA:  Post left hip arthroplasty EXAM: DG C-ARM 61-120 MIN; OPERATIVE LEFT HIP WITH PELVIS COMPARISON:  12/04/2016 FINDINGS: Two views of the left hip submitted. There is left hip prosthesis with anatomic alignment. IMPRESSION: Left hip prosthesis with anatomic alignment. Fluoroscopy time was 41 seconds. Please see the operative report. Electronically Signed   By: Lahoma Crocker M.D.   On: 02/21/2017 16:43    Disposition: 01-Home or Self Care  Discharge Instructions    Call MD / Call 911    Complete by:  As directed    If you experience chest pain or shortness of breath, CALL 911 and be transported to the hospital emergency room.  If you develope a fever above 101.5 F, pus (white drainage) or increased  drainage or redness at the wound, or calf pain, call your surgeon's office.   Constipation Prevention    Complete by:  As directed    Drink plenty of fluids.  Prune juice may be helpful.  You may use a stool softener, such as Colace (over the counter) 100 mg twice a day.  Use MiraLax (over the counter) for constipation as needed.   Driving restrictions    Complete by:  As directed    No driving while taking narcotic pain meds.   Increase activity slowly as tolerated    Complete by:  As directed       Follow-up Information    Eduard Roux, MD Follow up in 2 week(s).   Specialty:  Orthopedic Surgery Why:  For suture removal, For wound re-check Contact information: [place] [address] [place] Follow up.   Specialty:  Home Health Services Why:  A representative from [facility] will contact you to arrange start date and time for your therapy. Contact information: [address] [place] [address] [phone]            Signed: Eduard Roux 02/26/2017, 7:45 AM

## 2017-02-26 NOTE — Progress Notes (Signed)
Physical Therapy Treatment Patient Details Name: Rhonda Morrow MRN: [id number] DOB: 04/15/1961 Today's Date: 02/26/2017    History of Present Illness 56 y.o. female admitted to [facility] on 02/21/17 for elective direct anterior THA.  Pt with significant PMHx of HTN, DM, and deaf.    PT Comments    Sign language interpreter present and pt was able to demonstrate the ability to get up OOB, ambulate safely using RW, and progress up and down a flight of stairs with one rail simulating home.  She was not worried about practicing 2 without railings (for home entry) as her sons were going to help her into the house.  Pt is safe for d/c home later today.    Follow Up Recommendations  Home health PT     Equipment Recommendations  Rolling walker with 5" wheels    Recommendations for Other Services   NA     Precautions / Restrictions Precautions Precautions: None Restrictions LLE Weight Bearing: Weight bearing as tolerated    Mobility  Bed Mobility Overal bed mobility: Needs Assistance Bed Mobility: Supine to Sit     Supine to sit: Min guard Sit to supine: Min assist   General bed mobility comments: Min guard assist to help progress leg out of bed and min assist to help lift the left leg back into bed.    Transfers Overall transfer level: Needs assistance Equipment used: Rolling walker (2 wheeled) Transfers: Sit to/from Stand Sit to Stand: Min guard;Supervision         General transfer comment: supervision from higher bed surface, min guard from lower bench.   Ambulation/Gait Ambulation/Gait assistance: Supervision Ambulation Distance (Feet): 120 Feet Assistive device: Rolling walker (2 wheeled) Gait Pattern/deviations: Step-through pattern;Antalgic Gait velocity: decreased   General Gait Details: moderately antalgic gait pattern.  Safe technique with RW, seated rest break before doing the stiars, O2 sats 99% on RA, DOE 2/4, HR 110 bpm.    Stairs Stairs: Yes   Stair  Management: One rail Right;Step to pattern;Forwards Number of Stairs: 10 General stair comments: min guard assist for safety due to more pain in left hip and foot today.  Verbal cues for correct LE sequencing as she needs reminders to lead down with her left leg.          Balance Overall balance assessment: Needs assistance Sitting-balance support: Feet supported;No upper extremity supported Sitting balance-Leahy Scale: Good     Standing balance support: Bilateral upper extremity supported Standing balance-Leahy Scale: Fair                              Cognition Arousal/Alertness: Awake/alert Behavior During Therapy: WFL for tasks assessed/performed Overall Cognitive Status: Within Functional Limits for tasks assessed                                        Exercises Total Joint Exercises Ankle Circles/Pumps: AROM;Both;20 reps Quad Sets: AROM;Left;10 reps Heel Slides: AAROM;Left;10 reps Hip ABduction/ADduction: AAROM;Left;10 reps        Pertinent Vitals/Pain Pain Assessment: Faces Faces Pain Scale: Hurts even more Pain Location: left foot (due to swelling) and left thigh Pain Descriptors / Indicators: Burning;Constant Pain Intervention(s): Limited activity within patient's tolerance;Monitored during session;Repositioned;Ice applied           PT Goals (current goals can now be found in the care plan section) Acute  Rehab PT Goals Patient Stated Goal: to get back to walking pain free Progress towards PT goals: Progressing toward goals    Frequency    7X/week      PT Plan Current plan remains appropriate       End of Session Equipment Utilized During Treatment: Gait belt Activity Tolerance: Patient limited by pain Patient left: in bed;with call bell/phone within reach Nurse Communication: Mobility status PT Visit Diagnosis: Muscle weakness (generalized) (M62.81);Difficulty in walking, not elsewhere classified (R26.2);Pain Pain  - Right/Left: Left Pain - part of body: Hip     Time: 1205-1254 PT Time Calculation (min) (ACUTE ONLY): 49 min  Charges:  $Gait Training: 23-37 mins $Therapeutic Exercise: 8-22 mins                       Lynn Sissel B. [place], [place], DPT [phone]   02/26/2017, 3:07 PM

## 2017-02-26 NOTE — Progress Notes (Signed)
   Subjective:  Patient reports pain as mild.  Progressing very well with PT  Objective:   VITALS:   Vitals:   02/25/17 0912 02/25/17 1704 02/25/17 2036 02/25/17 2130  BP:  (!) 121/55 (!) 118/53   Pulse: 96 84 92   Resp: 16 16 16    Temp:  98.4 F (36.9 C) 98.7 F (37.1 C)   TempSrc:   Oral   SpO2: 97% 99% 100% 99%  Weight:        Neurologically intact Neurovascular intact Sensation intact distally Intact pulses distally Dorsiflexion/Plantar flexion intact Incision: dressing C/D/I and no drainage No cellulitis present Compartment soft   Lab Results  Component Value Date   WBC 9.2 02/22/2017   HGB 11.4 (L) 02/22/2017   HCT 35.9 (L) 02/22/2017   MCV 84.5 02/22/2017   PLT 208 02/22/2017     Assessment/Plan:  5 Days Post-Op   - progressed well with PT, is appropriate for HHPT - dc today  Eduard Roux 02/26/2017, 7:43 AM [phone]

## 2017-02-28 DIAGNOSIS — E1149 Type 2 diabetes mellitus with other diabetic neurological complication: Secondary | ICD-10-CM | POA: Diagnosis not present

## 2017-02-28 DIAGNOSIS — M79672 Pain in left foot: Secondary | ICD-10-CM | POA: Diagnosis not present

## 2017-02-28 DIAGNOSIS — E010 Iodine-deficiency related diffuse (endemic) goiter: Secondary | ICD-10-CM | POA: Diagnosis not present

## 2017-02-28 DIAGNOSIS — M1990 Unspecified osteoarthritis, unspecified site: Secondary | ICD-10-CM | POA: Diagnosis not present

## 2017-02-28 DIAGNOSIS — K219 Gastro-esophageal reflux disease without esophagitis: Secondary | ICD-10-CM | POA: Diagnosis not present

## 2017-02-28 DIAGNOSIS — Z471 Aftercare following joint replacement surgery: Secondary | ICD-10-CM | POA: Diagnosis not present

## 2017-02-28 DIAGNOSIS — H8110 Benign paroxysmal vertigo, unspecified ear: Secondary | ICD-10-CM | POA: Diagnosis not present

## 2017-02-28 DIAGNOSIS — I10 Essential (primary) hypertension: Secondary | ICD-10-CM | POA: Diagnosis not present

## 2017-02-28 DIAGNOSIS — R1314 Dysphagia, pharyngoesophageal phase: Secondary | ICD-10-CM | POA: Diagnosis not present

## 2017-02-28 DIAGNOSIS — E079 Disorder of thyroid, unspecified: Secondary | ICD-10-CM | POA: Diagnosis not present

## 2017-02-28 DIAGNOSIS — M25562 Pain in left knee: Secondary | ICD-10-CM | POA: Diagnosis not present

## 2017-02-28 DIAGNOSIS — N3289 Other specified disorders of bladder: Secondary | ICD-10-CM | POA: Diagnosis not present

## 2017-02-28 DIAGNOSIS — J45909 Unspecified asthma, uncomplicated: Secondary | ICD-10-CM | POA: Diagnosis not present

## 2017-03-01 ENCOUNTER — Telehealth: Payer: Self-pay | Admitting: Family Medicine

## 2017-03-01 NOTE — Telephone Encounter (Signed)
HHRN informed of verbal ok. 

## 2017-03-01 NOTE — Telephone Encounter (Signed)
Caller name:  Rondel Baton  Relation to pt: RN [facility]  Call back number: [phone]    Reason for call:  Verbal orders for physical therapy and skilled nursing due to patient being discharged from hospital.

## 2017-03-02 DIAGNOSIS — J45909 Unspecified asthma, uncomplicated: Secondary | ICD-10-CM | POA: Diagnosis not present

## 2017-03-02 DIAGNOSIS — M1990 Unspecified osteoarthritis, unspecified site: Secondary | ICD-10-CM | POA: Diagnosis not present

## 2017-03-02 DIAGNOSIS — R1314 Dysphagia, pharyngoesophageal phase: Secondary | ICD-10-CM | POA: Diagnosis not present

## 2017-03-02 DIAGNOSIS — M79672 Pain in left foot: Secondary | ICD-10-CM | POA: Diagnosis not present

## 2017-03-02 DIAGNOSIS — E079 Disorder of thyroid, unspecified: Secondary | ICD-10-CM | POA: Diagnosis not present

## 2017-03-02 DIAGNOSIS — Z471 Aftercare following joint replacement surgery: Secondary | ICD-10-CM | POA: Diagnosis not present

## 2017-03-02 DIAGNOSIS — H8110 Benign paroxysmal vertigo, unspecified ear: Secondary | ICD-10-CM | POA: Diagnosis not present

## 2017-03-02 DIAGNOSIS — M25562 Pain in left knee: Secondary | ICD-10-CM | POA: Diagnosis not present

## 2017-03-02 DIAGNOSIS — E1149 Type 2 diabetes mellitus with other diabetic neurological complication: Secondary | ICD-10-CM | POA: Diagnosis not present

## 2017-03-02 DIAGNOSIS — K219 Gastro-esophageal reflux disease without esophagitis: Secondary | ICD-10-CM | POA: Diagnosis not present

## 2017-03-02 DIAGNOSIS — I10 Essential (primary) hypertension: Secondary | ICD-10-CM | POA: Diagnosis not present

## 2017-03-02 DIAGNOSIS — E010 Iodine-deficiency related diffuse (endemic) goiter: Secondary | ICD-10-CM | POA: Diagnosis not present

## 2017-03-02 DIAGNOSIS — N3289 Other specified disorders of bladder: Secondary | ICD-10-CM | POA: Diagnosis not present

## 2017-03-06 ENCOUNTER — Encounter (INDEPENDENT_AMBULATORY_CARE_PROVIDER_SITE_OTHER): Payer: Self-pay | Admitting: Orthopaedic Surgery

## 2017-03-06 ENCOUNTER — Ambulatory Visit (INDEPENDENT_AMBULATORY_CARE_PROVIDER_SITE_OTHER): Payer: Federal, State, Local not specified - PPO | Admitting: Orthopaedic Surgery

## 2017-03-06 DIAGNOSIS — N3289 Other specified disorders of bladder: Secondary | ICD-10-CM | POA: Diagnosis not present

## 2017-03-06 DIAGNOSIS — E010 Iodine-deficiency related diffuse (endemic) goiter: Secondary | ICD-10-CM | POA: Diagnosis not present

## 2017-03-06 DIAGNOSIS — M25562 Pain in left knee: Secondary | ICD-10-CM | POA: Diagnosis not present

## 2017-03-06 DIAGNOSIS — K219 Gastro-esophageal reflux disease without esophagitis: Secondary | ICD-10-CM | POA: Diagnosis not present

## 2017-03-06 DIAGNOSIS — H8110 Benign paroxysmal vertigo, unspecified ear: Secondary | ICD-10-CM | POA: Diagnosis not present

## 2017-03-06 DIAGNOSIS — M1990 Unspecified osteoarthritis, unspecified site: Secondary | ICD-10-CM | POA: Diagnosis not present

## 2017-03-06 DIAGNOSIS — M79672 Pain in left foot: Secondary | ICD-10-CM | POA: Diagnosis not present

## 2017-03-06 DIAGNOSIS — M1612 Unilateral primary osteoarthritis, left hip: Secondary | ICD-10-CM

## 2017-03-06 DIAGNOSIS — I10 Essential (primary) hypertension: Secondary | ICD-10-CM | POA: Diagnosis not present

## 2017-03-06 DIAGNOSIS — Z471 Aftercare following joint replacement surgery: Secondary | ICD-10-CM | POA: Diagnosis not present

## 2017-03-06 DIAGNOSIS — E079 Disorder of thyroid, unspecified: Secondary | ICD-10-CM | POA: Diagnosis not present

## 2017-03-06 DIAGNOSIS — E1149 Type 2 diabetes mellitus with other diabetic neurological complication: Secondary | ICD-10-CM | POA: Diagnosis not present

## 2017-03-06 DIAGNOSIS — J45909 Unspecified asthma, uncomplicated: Secondary | ICD-10-CM | POA: Diagnosis not present

## 2017-03-06 DIAGNOSIS — R1314 Dysphagia, pharyngoesophageal phase: Secondary | ICD-10-CM | POA: Diagnosis not present

## 2017-03-06 NOTE — Progress Notes (Signed)
Patient is 2 weeks status post left total hip replacement. She is here with her sign language interpreter. She is doing well. She is doing home health physical therapy. The incisions healed no signs of infection. She did have a misunderstanding about taking aspirin. She thought this was for pain. I told her that this is for DVT prophylaxis. She needs to take this twice a day. From my standpoint everything looks good. I'll see her back in 4 weeks with standing AP pelvis.

## 2017-03-07 DIAGNOSIS — E079 Disorder of thyroid, unspecified: Secondary | ICD-10-CM | POA: Diagnosis not present

## 2017-03-07 DIAGNOSIS — M1990 Unspecified osteoarthritis, unspecified site: Secondary | ICD-10-CM | POA: Diagnosis not present

## 2017-03-07 DIAGNOSIS — N3289 Other specified disorders of bladder: Secondary | ICD-10-CM | POA: Diagnosis not present

## 2017-03-07 DIAGNOSIS — E010 Iodine-deficiency related diffuse (endemic) goiter: Secondary | ICD-10-CM | POA: Diagnosis not present

## 2017-03-07 DIAGNOSIS — R1314 Dysphagia, pharyngoesophageal phase: Secondary | ICD-10-CM | POA: Diagnosis not present

## 2017-03-07 DIAGNOSIS — K219 Gastro-esophageal reflux disease without esophagitis: Secondary | ICD-10-CM | POA: Diagnosis not present

## 2017-03-07 DIAGNOSIS — M25562 Pain in left knee: Secondary | ICD-10-CM | POA: Diagnosis not present

## 2017-03-07 DIAGNOSIS — I10 Essential (primary) hypertension: Secondary | ICD-10-CM | POA: Diagnosis not present

## 2017-03-07 DIAGNOSIS — J45909 Unspecified asthma, uncomplicated: Secondary | ICD-10-CM | POA: Diagnosis not present

## 2017-03-07 DIAGNOSIS — E1149 Type 2 diabetes mellitus with other diabetic neurological complication: Secondary | ICD-10-CM | POA: Diagnosis not present

## 2017-03-07 DIAGNOSIS — M79672 Pain in left foot: Secondary | ICD-10-CM | POA: Diagnosis not present

## 2017-03-07 DIAGNOSIS — Z471 Aftercare following joint replacement surgery: Secondary | ICD-10-CM | POA: Diagnosis not present

## 2017-03-07 DIAGNOSIS — H8110 Benign paroxysmal vertigo, unspecified ear: Secondary | ICD-10-CM | POA: Diagnosis not present

## 2017-03-09 DIAGNOSIS — N3289 Other specified disorders of bladder: Secondary | ICD-10-CM | POA: Diagnosis not present

## 2017-03-09 DIAGNOSIS — M79672 Pain in left foot: Secondary | ICD-10-CM | POA: Diagnosis not present

## 2017-03-09 DIAGNOSIS — Z471 Aftercare following joint replacement surgery: Secondary | ICD-10-CM | POA: Diagnosis not present

## 2017-03-09 DIAGNOSIS — M25562 Pain in left knee: Secondary | ICD-10-CM | POA: Diagnosis not present

## 2017-03-09 DIAGNOSIS — K219 Gastro-esophageal reflux disease without esophagitis: Secondary | ICD-10-CM | POA: Diagnosis not present

## 2017-03-09 DIAGNOSIS — E079 Disorder of thyroid, unspecified: Secondary | ICD-10-CM | POA: Diagnosis not present

## 2017-03-09 DIAGNOSIS — R1314 Dysphagia, pharyngoesophageal phase: Secondary | ICD-10-CM | POA: Diagnosis not present

## 2017-03-09 DIAGNOSIS — E010 Iodine-deficiency related diffuse (endemic) goiter: Secondary | ICD-10-CM | POA: Diagnosis not present

## 2017-03-09 DIAGNOSIS — M1990 Unspecified osteoarthritis, unspecified site: Secondary | ICD-10-CM | POA: Diagnosis not present

## 2017-03-09 DIAGNOSIS — E1149 Type 2 diabetes mellitus with other diabetic neurological complication: Secondary | ICD-10-CM | POA: Diagnosis not present

## 2017-03-09 DIAGNOSIS — H8110 Benign paroxysmal vertigo, unspecified ear: Secondary | ICD-10-CM | POA: Diagnosis not present

## 2017-03-09 DIAGNOSIS — J45909 Unspecified asthma, uncomplicated: Secondary | ICD-10-CM | POA: Diagnosis not present

## 2017-03-09 DIAGNOSIS — I10 Essential (primary) hypertension: Secondary | ICD-10-CM | POA: Diagnosis not present

## 2017-03-12 ENCOUNTER — Other Ambulatory Visit: Payer: Self-pay | Admitting: Family Medicine

## 2017-03-12 DIAGNOSIS — M79672 Pain in left foot: Secondary | ICD-10-CM | POA: Diagnosis not present

## 2017-03-12 DIAGNOSIS — I10 Essential (primary) hypertension: Secondary | ICD-10-CM | POA: Diagnosis not present

## 2017-03-12 DIAGNOSIS — M25552 Pain in left hip: Secondary | ICD-10-CM

## 2017-03-12 DIAGNOSIS — N3289 Other specified disorders of bladder: Secondary | ICD-10-CM | POA: Diagnosis not present

## 2017-03-12 DIAGNOSIS — M25562 Pain in left knee: Secondary | ICD-10-CM | POA: Diagnosis not present

## 2017-03-12 DIAGNOSIS — M1990 Unspecified osteoarthritis, unspecified site: Secondary | ICD-10-CM | POA: Diagnosis not present

## 2017-03-12 DIAGNOSIS — E1149 Type 2 diabetes mellitus with other diabetic neurological complication: Secondary | ICD-10-CM | POA: Diagnosis not present

## 2017-03-12 DIAGNOSIS — H8110 Benign paroxysmal vertigo, unspecified ear: Secondary | ICD-10-CM | POA: Diagnosis not present

## 2017-03-12 DIAGNOSIS — Z471 Aftercare following joint replacement surgery: Secondary | ICD-10-CM | POA: Diagnosis not present

## 2017-03-12 DIAGNOSIS — K219 Gastro-esophageal reflux disease without esophagitis: Secondary | ICD-10-CM | POA: Diagnosis not present

## 2017-03-12 DIAGNOSIS — E010 Iodine-deficiency related diffuse (endemic) goiter: Secondary | ICD-10-CM | POA: Diagnosis not present

## 2017-03-12 DIAGNOSIS — R1314 Dysphagia, pharyngoesophageal phase: Secondary | ICD-10-CM | POA: Diagnosis not present

## 2017-03-12 DIAGNOSIS — E079 Disorder of thyroid, unspecified: Secondary | ICD-10-CM | POA: Diagnosis not present

## 2017-03-12 DIAGNOSIS — J45909 Unspecified asthma, uncomplicated: Secondary | ICD-10-CM | POA: Diagnosis not present

## 2017-03-13 ENCOUNTER — Ambulatory Visit (INDEPENDENT_AMBULATORY_CARE_PROVIDER_SITE_OTHER): Payer: Federal, State, Local not specified - PPO

## 2017-03-13 DIAGNOSIS — E538 Deficiency of other specified B group vitamins: Secondary | ICD-10-CM | POA: Diagnosis not present

## 2017-03-13 MED ORDER — CYANOCOBALAMIN 1000 MCG/ML IJ SOLN
1000.0000 ug | Freq: Once | INTRAMUSCULAR | Status: AC
  Administered 2017-03-13: 1000 ug via INTRAMUSCULAR

## 2017-03-13 NOTE — Progress Notes (Signed)
Pre visit review using our clinic tool,if applicable. No additional management support is needed unless otherwise documented below in the visit note  Patient in for B12 injection. Per order from Dr. Carollee Herter.   Given 1000 mg  IM right deltoid. Patient tolerated well.   Translator did not accompany patient today but no communications barriers.  Appointment scheduled for 4 weeks.

## 2017-03-14 DIAGNOSIS — N3289 Other specified disorders of bladder: Secondary | ICD-10-CM | POA: Diagnosis not present

## 2017-03-14 DIAGNOSIS — E1149 Type 2 diabetes mellitus with other diabetic neurological complication: Secondary | ICD-10-CM | POA: Diagnosis not present

## 2017-03-14 DIAGNOSIS — I10 Essential (primary) hypertension: Secondary | ICD-10-CM | POA: Diagnosis not present

## 2017-03-14 DIAGNOSIS — R1314 Dysphagia, pharyngoesophageal phase: Secondary | ICD-10-CM | POA: Diagnosis not present

## 2017-03-14 DIAGNOSIS — E079 Disorder of thyroid, unspecified: Secondary | ICD-10-CM | POA: Diagnosis not present

## 2017-03-14 DIAGNOSIS — K219 Gastro-esophageal reflux disease without esophagitis: Secondary | ICD-10-CM | POA: Diagnosis not present

## 2017-03-14 DIAGNOSIS — M79672 Pain in left foot: Secondary | ICD-10-CM | POA: Diagnosis not present

## 2017-03-14 DIAGNOSIS — Z471 Aftercare following joint replacement surgery: Secondary | ICD-10-CM | POA: Diagnosis not present

## 2017-03-14 DIAGNOSIS — E010 Iodine-deficiency related diffuse (endemic) goiter: Secondary | ICD-10-CM | POA: Diagnosis not present

## 2017-03-14 DIAGNOSIS — H8110 Benign paroxysmal vertigo, unspecified ear: Secondary | ICD-10-CM | POA: Diagnosis not present

## 2017-03-14 DIAGNOSIS — M1990 Unspecified osteoarthritis, unspecified site: Secondary | ICD-10-CM | POA: Diagnosis not present

## 2017-03-14 DIAGNOSIS — J45909 Unspecified asthma, uncomplicated: Secondary | ICD-10-CM | POA: Diagnosis not present

## 2017-03-14 DIAGNOSIS — M25562 Pain in left knee: Secondary | ICD-10-CM | POA: Diagnosis not present

## 2017-03-15 ENCOUNTER — Ambulatory Visit: Payer: Self-pay | Admitting: Family Medicine

## 2017-03-15 ENCOUNTER — Telehealth: Payer: Self-pay | Admitting: Family Medicine

## 2017-03-15 DIAGNOSIS — M79672 Pain in left foot: Secondary | ICD-10-CM | POA: Diagnosis not present

## 2017-03-15 DIAGNOSIS — R1314 Dysphagia, pharyngoesophageal phase: Secondary | ICD-10-CM | POA: Diagnosis not present

## 2017-03-15 DIAGNOSIS — J45909 Unspecified asthma, uncomplicated: Secondary | ICD-10-CM | POA: Diagnosis not present

## 2017-03-15 DIAGNOSIS — N3289 Other specified disorders of bladder: Secondary | ICD-10-CM | POA: Diagnosis not present

## 2017-03-15 DIAGNOSIS — Z471 Aftercare following joint replacement surgery: Secondary | ICD-10-CM | POA: Diagnosis not present

## 2017-03-15 DIAGNOSIS — H8110 Benign paroxysmal vertigo, unspecified ear: Secondary | ICD-10-CM | POA: Diagnosis not present

## 2017-03-15 DIAGNOSIS — K219 Gastro-esophageal reflux disease without esophagitis: Secondary | ICD-10-CM | POA: Diagnosis not present

## 2017-03-15 DIAGNOSIS — I10 Essential (primary) hypertension: Secondary | ICD-10-CM | POA: Diagnosis not present

## 2017-03-15 DIAGNOSIS — M25562 Pain in left knee: Secondary | ICD-10-CM | POA: Diagnosis not present

## 2017-03-15 DIAGNOSIS — E079 Disorder of thyroid, unspecified: Secondary | ICD-10-CM | POA: Diagnosis not present

## 2017-03-15 DIAGNOSIS — E010 Iodine-deficiency related diffuse (endemic) goiter: Secondary | ICD-10-CM | POA: Diagnosis not present

## 2017-03-15 DIAGNOSIS — E1149 Type 2 diabetes mellitus with other diabetic neurological complication: Secondary | ICD-10-CM | POA: Diagnosis not present

## 2017-03-15 DIAGNOSIS — M1990 Unspecified osteoarthritis, unspecified site: Secondary | ICD-10-CM | POA: Diagnosis not present

## 2017-03-15 NOTE — Telephone Encounter (Signed)
Patient called cancelling her 2:30pm appointment for today stating she doesn't feel well, patient states she feels dizzy (offered to transfer her to team health, patient declined stating she's fine and she feels like from time to time due to her medication), patient states she will call back to [facility], charge or no charge

## 2017-03-15 NOTE — Telephone Encounter (Signed)
No charge. 

## 2017-03-16 DIAGNOSIS — E010 Iodine-deficiency related diffuse (endemic) goiter: Secondary | ICD-10-CM | POA: Diagnosis not present

## 2017-03-16 DIAGNOSIS — E1149 Type 2 diabetes mellitus with other diabetic neurological complication: Secondary | ICD-10-CM | POA: Diagnosis not present

## 2017-03-16 DIAGNOSIS — J45909 Unspecified asthma, uncomplicated: Secondary | ICD-10-CM | POA: Diagnosis not present

## 2017-03-16 DIAGNOSIS — Z471 Aftercare following joint replacement surgery: Secondary | ICD-10-CM | POA: Diagnosis not present

## 2017-03-16 DIAGNOSIS — R1314 Dysphagia, pharyngoesophageal phase: Secondary | ICD-10-CM | POA: Diagnosis not present

## 2017-03-16 DIAGNOSIS — K219 Gastro-esophageal reflux disease without esophagitis: Secondary | ICD-10-CM | POA: Diagnosis not present

## 2017-03-16 DIAGNOSIS — M1990 Unspecified osteoarthritis, unspecified site: Secondary | ICD-10-CM | POA: Diagnosis not present

## 2017-03-16 DIAGNOSIS — E079 Disorder of thyroid, unspecified: Secondary | ICD-10-CM | POA: Diagnosis not present

## 2017-03-16 DIAGNOSIS — N3289 Other specified disorders of bladder: Secondary | ICD-10-CM | POA: Diagnosis not present

## 2017-03-16 DIAGNOSIS — M25562 Pain in left knee: Secondary | ICD-10-CM | POA: Diagnosis not present

## 2017-03-16 DIAGNOSIS — M79672 Pain in left foot: Secondary | ICD-10-CM | POA: Diagnosis not present

## 2017-03-16 DIAGNOSIS — H8110 Benign paroxysmal vertigo, unspecified ear: Secondary | ICD-10-CM | POA: Diagnosis not present

## 2017-03-16 DIAGNOSIS — I10 Essential (primary) hypertension: Secondary | ICD-10-CM | POA: Diagnosis not present

## 2017-03-20 DIAGNOSIS — Z471 Aftercare following joint replacement surgery: Secondary | ICD-10-CM | POA: Diagnosis not present

## 2017-03-20 DIAGNOSIS — R1314 Dysphagia, pharyngoesophageal phase: Secondary | ICD-10-CM | POA: Diagnosis not present

## 2017-03-20 DIAGNOSIS — E1149 Type 2 diabetes mellitus with other diabetic neurological complication: Secondary | ICD-10-CM | POA: Diagnosis not present

## 2017-03-20 DIAGNOSIS — K219 Gastro-esophageal reflux disease without esophagitis: Secondary | ICD-10-CM | POA: Diagnosis not present

## 2017-03-20 DIAGNOSIS — N3289 Other specified disorders of bladder: Secondary | ICD-10-CM | POA: Diagnosis not present

## 2017-03-20 DIAGNOSIS — M79672 Pain in left foot: Secondary | ICD-10-CM | POA: Diagnosis not present

## 2017-03-20 DIAGNOSIS — M1990 Unspecified osteoarthritis, unspecified site: Secondary | ICD-10-CM | POA: Diagnosis not present

## 2017-03-20 DIAGNOSIS — J45909 Unspecified asthma, uncomplicated: Secondary | ICD-10-CM | POA: Diagnosis not present

## 2017-03-20 DIAGNOSIS — E079 Disorder of thyroid, unspecified: Secondary | ICD-10-CM | POA: Diagnosis not present

## 2017-03-20 DIAGNOSIS — M25562 Pain in left knee: Secondary | ICD-10-CM | POA: Diagnosis not present

## 2017-03-20 DIAGNOSIS — H8110 Benign paroxysmal vertigo, unspecified ear: Secondary | ICD-10-CM | POA: Diagnosis not present

## 2017-03-20 DIAGNOSIS — E010 Iodine-deficiency related diffuse (endemic) goiter: Secondary | ICD-10-CM | POA: Diagnosis not present

## 2017-03-20 DIAGNOSIS — I10 Essential (primary) hypertension: Secondary | ICD-10-CM | POA: Diagnosis not present

## 2017-03-22 DIAGNOSIS — M79672 Pain in left foot: Secondary | ICD-10-CM | POA: Diagnosis not present

## 2017-03-22 DIAGNOSIS — E010 Iodine-deficiency related diffuse (endemic) goiter: Secondary | ICD-10-CM | POA: Diagnosis not present

## 2017-03-22 DIAGNOSIS — E1149 Type 2 diabetes mellitus with other diabetic neurological complication: Secondary | ICD-10-CM | POA: Diagnosis not present

## 2017-03-22 DIAGNOSIS — R1314 Dysphagia, pharyngoesophageal phase: Secondary | ICD-10-CM | POA: Diagnosis not present

## 2017-03-22 DIAGNOSIS — J45909 Unspecified asthma, uncomplicated: Secondary | ICD-10-CM | POA: Diagnosis not present

## 2017-03-22 DIAGNOSIS — M1990 Unspecified osteoarthritis, unspecified site: Secondary | ICD-10-CM | POA: Diagnosis not present

## 2017-03-22 DIAGNOSIS — E079 Disorder of thyroid, unspecified: Secondary | ICD-10-CM | POA: Diagnosis not present

## 2017-03-22 DIAGNOSIS — I10 Essential (primary) hypertension: Secondary | ICD-10-CM | POA: Diagnosis not present

## 2017-03-22 DIAGNOSIS — Z471 Aftercare following joint replacement surgery: Secondary | ICD-10-CM | POA: Diagnosis not present

## 2017-03-22 DIAGNOSIS — H8110 Benign paroxysmal vertigo, unspecified ear: Secondary | ICD-10-CM | POA: Diagnosis not present

## 2017-03-22 DIAGNOSIS — M25562 Pain in left knee: Secondary | ICD-10-CM | POA: Diagnosis not present

## 2017-03-22 DIAGNOSIS — K219 Gastro-esophageal reflux disease without esophagitis: Secondary | ICD-10-CM | POA: Diagnosis not present

## 2017-03-22 DIAGNOSIS — N3289 Other specified disorders of bladder: Secondary | ICD-10-CM | POA: Diagnosis not present

## 2017-03-23 DIAGNOSIS — E1149 Type 2 diabetes mellitus with other diabetic neurological complication: Secondary | ICD-10-CM | POA: Diagnosis not present

## 2017-03-23 DIAGNOSIS — R1314 Dysphagia, pharyngoesophageal phase: Secondary | ICD-10-CM | POA: Diagnosis not present

## 2017-03-23 DIAGNOSIS — M79672 Pain in left foot: Secondary | ICD-10-CM | POA: Diagnosis not present

## 2017-03-23 DIAGNOSIS — M25562 Pain in left knee: Secondary | ICD-10-CM | POA: Diagnosis not present

## 2017-03-23 DIAGNOSIS — Z471 Aftercare following joint replacement surgery: Secondary | ICD-10-CM | POA: Diagnosis not present

## 2017-03-23 DIAGNOSIS — J45909 Unspecified asthma, uncomplicated: Secondary | ICD-10-CM | POA: Diagnosis not present

## 2017-03-23 DIAGNOSIS — I10 Essential (primary) hypertension: Secondary | ICD-10-CM | POA: Diagnosis not present

## 2017-03-23 DIAGNOSIS — K219 Gastro-esophageal reflux disease without esophagitis: Secondary | ICD-10-CM | POA: Diagnosis not present

## 2017-03-23 DIAGNOSIS — H8110 Benign paroxysmal vertigo, unspecified ear: Secondary | ICD-10-CM | POA: Diagnosis not present

## 2017-03-23 DIAGNOSIS — E010 Iodine-deficiency related diffuse (endemic) goiter: Secondary | ICD-10-CM | POA: Diagnosis not present

## 2017-03-23 DIAGNOSIS — M1990 Unspecified osteoarthritis, unspecified site: Secondary | ICD-10-CM | POA: Diagnosis not present

## 2017-03-23 DIAGNOSIS — N3289 Other specified disorders of bladder: Secondary | ICD-10-CM | POA: Diagnosis not present

## 2017-03-23 DIAGNOSIS — E079 Disorder of thyroid, unspecified: Secondary | ICD-10-CM | POA: Diagnosis not present

## 2017-03-25 ENCOUNTER — Other Ambulatory Visit: Payer: Self-pay | Admitting: Family Medicine

## 2017-03-25 DIAGNOSIS — E1151 Type 2 diabetes mellitus with diabetic peripheral angiopathy without gangrene: Secondary | ICD-10-CM

## 2017-03-25 DIAGNOSIS — IMO0002 Reserved for concepts with insufficient information to code with codable children: Secondary | ICD-10-CM

## 2017-03-25 DIAGNOSIS — E1165 Type 2 diabetes mellitus with hyperglycemia: Principal | ICD-10-CM

## 2017-03-27 ENCOUNTER — Telehealth: Payer: Self-pay | Admitting: Family Medicine

## 2017-03-27 DIAGNOSIS — M79672 Pain in left foot: Secondary | ICD-10-CM | POA: Diagnosis not present

## 2017-03-27 DIAGNOSIS — M25512 Pain in left shoulder: Secondary | ICD-10-CM

## 2017-03-27 DIAGNOSIS — E1149 Type 2 diabetes mellitus with other diabetic neurological complication: Secondary | ICD-10-CM | POA: Diagnosis not present

## 2017-03-27 DIAGNOSIS — E079 Disorder of thyroid, unspecified: Secondary | ICD-10-CM | POA: Diagnosis not present

## 2017-03-27 DIAGNOSIS — M25562 Pain in left knee: Secondary | ICD-10-CM | POA: Diagnosis not present

## 2017-03-27 DIAGNOSIS — R1314 Dysphagia, pharyngoesophageal phase: Secondary | ICD-10-CM | POA: Diagnosis not present

## 2017-03-27 DIAGNOSIS — J45909 Unspecified asthma, uncomplicated: Secondary | ICD-10-CM | POA: Diagnosis not present

## 2017-03-27 DIAGNOSIS — K219 Gastro-esophageal reflux disease without esophagitis: Secondary | ICD-10-CM | POA: Diagnosis not present

## 2017-03-27 DIAGNOSIS — Z471 Aftercare following joint replacement surgery: Secondary | ICD-10-CM | POA: Diagnosis not present

## 2017-03-27 DIAGNOSIS — N3289 Other specified disorders of bladder: Secondary | ICD-10-CM | POA: Diagnosis not present

## 2017-03-27 DIAGNOSIS — H8110 Benign paroxysmal vertigo, unspecified ear: Secondary | ICD-10-CM | POA: Diagnosis not present

## 2017-03-27 DIAGNOSIS — I10 Essential (primary) hypertension: Secondary | ICD-10-CM | POA: Diagnosis not present

## 2017-03-27 DIAGNOSIS — E010 Iodine-deficiency related diffuse (endemic) goiter: Secondary | ICD-10-CM | POA: Diagnosis not present

## 2017-03-27 DIAGNOSIS — M1990 Unspecified osteoarthritis, unspecified site: Secondary | ICD-10-CM | POA: Diagnosis not present

## 2017-03-27 MED ORDER — TRAMADOL HCL 50 MG PO TABS: 50.0000 mg | ORAL_TABLET | Freq: Every evening | ORAL | 0 refills | Status: DC | PRN

## 2017-03-27 NOTE — Telephone Encounter (Signed)
Relation to UT:MLYY Call back [phone]   Reason for call:  Patient requesting a refill traMADol (ULTRAM) 50 MG tablet

## 2017-03-27 NOTE — Telephone Encounter (Signed)
Called the patient to inform refill done

## 2017-03-27 NOTE — Telephone Encounter (Signed)
Requesting:   tramadol Contract    11/07/16 UDS   none Last OV    12/04/16-----future appt is on 06/04/2017 Last Refill    #30 no refills on 02/06/17  Please Advise

## 2017-03-27 NOTE — Telephone Encounter (Signed)
Refill x1  Need uds, contract

## 2017-03-30 DIAGNOSIS — E1149 Type 2 diabetes mellitus with other diabetic neurological complication: Secondary | ICD-10-CM | POA: Diagnosis not present

## 2017-03-30 DIAGNOSIS — J45909 Unspecified asthma, uncomplicated: Secondary | ICD-10-CM | POA: Diagnosis not present

## 2017-03-30 DIAGNOSIS — E010 Iodine-deficiency related diffuse (endemic) goiter: Secondary | ICD-10-CM | POA: Diagnosis not present

## 2017-03-30 DIAGNOSIS — I10 Essential (primary) hypertension: Secondary | ICD-10-CM | POA: Diagnosis not present

## 2017-03-30 DIAGNOSIS — N3289 Other specified disorders of bladder: Secondary | ICD-10-CM | POA: Diagnosis not present

## 2017-03-30 DIAGNOSIS — H8110 Benign paroxysmal vertigo, unspecified ear: Secondary | ICD-10-CM | POA: Diagnosis not present

## 2017-03-30 DIAGNOSIS — Z471 Aftercare following joint replacement surgery: Secondary | ICD-10-CM | POA: Diagnosis not present

## 2017-03-30 DIAGNOSIS — R1314 Dysphagia, pharyngoesophageal phase: Secondary | ICD-10-CM | POA: Diagnosis not present

## 2017-03-30 DIAGNOSIS — M1990 Unspecified osteoarthritis, unspecified site: Secondary | ICD-10-CM | POA: Diagnosis not present

## 2017-03-30 DIAGNOSIS — M79672 Pain in left foot: Secondary | ICD-10-CM | POA: Diagnosis not present

## 2017-03-30 DIAGNOSIS — M25562 Pain in left knee: Secondary | ICD-10-CM | POA: Diagnosis not present

## 2017-03-30 DIAGNOSIS — E079 Disorder of thyroid, unspecified: Secondary | ICD-10-CM | POA: Diagnosis not present

## 2017-03-30 DIAGNOSIS — K219 Gastro-esophageal reflux disease without esophagitis: Secondary | ICD-10-CM | POA: Diagnosis not present

## 2017-04-03 DIAGNOSIS — E079 Disorder of thyroid, unspecified: Secondary | ICD-10-CM | POA: Diagnosis not present

## 2017-04-03 DIAGNOSIS — E010 Iodine-deficiency related diffuse (endemic) goiter: Secondary | ICD-10-CM | POA: Diagnosis not present

## 2017-04-03 DIAGNOSIS — E1149 Type 2 diabetes mellitus with other diabetic neurological complication: Secondary | ICD-10-CM | POA: Diagnosis not present

## 2017-04-03 DIAGNOSIS — M79672 Pain in left foot: Secondary | ICD-10-CM | POA: Diagnosis not present

## 2017-04-03 DIAGNOSIS — Z471 Aftercare following joint replacement surgery: Secondary | ICD-10-CM | POA: Diagnosis not present

## 2017-04-03 DIAGNOSIS — M25562 Pain in left knee: Secondary | ICD-10-CM | POA: Diagnosis not present

## 2017-04-03 DIAGNOSIS — N3289 Other specified disorders of bladder: Secondary | ICD-10-CM | POA: Diagnosis not present

## 2017-04-03 DIAGNOSIS — K219 Gastro-esophageal reflux disease without esophagitis: Secondary | ICD-10-CM | POA: Diagnosis not present

## 2017-04-03 DIAGNOSIS — M1990 Unspecified osteoarthritis, unspecified site: Secondary | ICD-10-CM | POA: Diagnosis not present

## 2017-04-03 DIAGNOSIS — J45909 Unspecified asthma, uncomplicated: Secondary | ICD-10-CM | POA: Diagnosis not present

## 2017-04-03 DIAGNOSIS — H8110 Benign paroxysmal vertigo, unspecified ear: Secondary | ICD-10-CM | POA: Diagnosis not present

## 2017-04-03 DIAGNOSIS — R1314 Dysphagia, pharyngoesophageal phase: Secondary | ICD-10-CM | POA: Diagnosis not present

## 2017-04-03 DIAGNOSIS — I10 Essential (primary) hypertension: Secondary | ICD-10-CM | POA: Diagnosis not present

## 2017-04-04 DIAGNOSIS — M79672 Pain in left foot: Secondary | ICD-10-CM | POA: Diagnosis not present

## 2017-04-04 DIAGNOSIS — E010 Iodine-deficiency related diffuse (endemic) goiter: Secondary | ICD-10-CM | POA: Diagnosis not present

## 2017-04-04 DIAGNOSIS — Z471 Aftercare following joint replacement surgery: Secondary | ICD-10-CM | POA: Diagnosis not present

## 2017-04-04 DIAGNOSIS — J45909 Unspecified asthma, uncomplicated: Secondary | ICD-10-CM | POA: Diagnosis not present

## 2017-04-04 DIAGNOSIS — R1314 Dysphagia, pharyngoesophageal phase: Secondary | ICD-10-CM | POA: Diagnosis not present

## 2017-04-04 DIAGNOSIS — M25562 Pain in left knee: Secondary | ICD-10-CM | POA: Diagnosis not present

## 2017-04-04 DIAGNOSIS — E079 Disorder of thyroid, unspecified: Secondary | ICD-10-CM | POA: Diagnosis not present

## 2017-04-04 DIAGNOSIS — I10 Essential (primary) hypertension: Secondary | ICD-10-CM | POA: Diagnosis not present

## 2017-04-04 DIAGNOSIS — N3289 Other specified disorders of bladder: Secondary | ICD-10-CM | POA: Diagnosis not present

## 2017-04-04 DIAGNOSIS — M1990 Unspecified osteoarthritis, unspecified site: Secondary | ICD-10-CM | POA: Diagnosis not present

## 2017-04-04 DIAGNOSIS — H8110 Benign paroxysmal vertigo, unspecified ear: Secondary | ICD-10-CM | POA: Diagnosis not present

## 2017-04-04 DIAGNOSIS — E1149 Type 2 diabetes mellitus with other diabetic neurological complication: Secondary | ICD-10-CM | POA: Diagnosis not present

## 2017-04-04 DIAGNOSIS — K219 Gastro-esophageal reflux disease without esophagitis: Secondary | ICD-10-CM | POA: Diagnosis not present

## 2017-04-06 ENCOUNTER — Encounter (INDEPENDENT_AMBULATORY_CARE_PROVIDER_SITE_OTHER): Payer: Self-pay | Admitting: Orthopaedic Surgery

## 2017-04-06 ENCOUNTER — Ambulatory Visit (INDEPENDENT_AMBULATORY_CARE_PROVIDER_SITE_OTHER): Payer: Federal, State, Local not specified - PPO

## 2017-04-06 ENCOUNTER — Ambulatory Visit (INDEPENDENT_AMBULATORY_CARE_PROVIDER_SITE_OTHER): Payer: Federal, State, Local not specified - PPO | Admitting: Orthopaedic Surgery

## 2017-04-06 DIAGNOSIS — M79672 Pain in left foot: Secondary | ICD-10-CM | POA: Diagnosis not present

## 2017-04-06 DIAGNOSIS — N3289 Other specified disorders of bladder: Secondary | ICD-10-CM | POA: Diagnosis not present

## 2017-04-06 DIAGNOSIS — M25562 Pain in left knee: Secondary | ICD-10-CM | POA: Diagnosis not present

## 2017-04-06 DIAGNOSIS — E010 Iodine-deficiency related diffuse (endemic) goiter: Secondary | ICD-10-CM | POA: Diagnosis not present

## 2017-04-06 DIAGNOSIS — M1612 Unilateral primary osteoarthritis, left hip: Secondary | ICD-10-CM | POA: Diagnosis not present

## 2017-04-06 DIAGNOSIS — I10 Essential (primary) hypertension: Secondary | ICD-10-CM | POA: Diagnosis not present

## 2017-04-06 DIAGNOSIS — E079 Disorder of thyroid, unspecified: Secondary | ICD-10-CM | POA: Diagnosis not present

## 2017-04-06 DIAGNOSIS — Z471 Aftercare following joint replacement surgery: Secondary | ICD-10-CM | POA: Diagnosis not present

## 2017-04-06 DIAGNOSIS — J45909 Unspecified asthma, uncomplicated: Secondary | ICD-10-CM | POA: Diagnosis not present

## 2017-04-06 DIAGNOSIS — E1149 Type 2 diabetes mellitus with other diabetic neurological complication: Secondary | ICD-10-CM | POA: Diagnosis not present

## 2017-04-06 DIAGNOSIS — H8110 Benign paroxysmal vertigo, unspecified ear: Secondary | ICD-10-CM | POA: Diagnosis not present

## 2017-04-06 DIAGNOSIS — M1990 Unspecified osteoarthritis, unspecified site: Secondary | ICD-10-CM | POA: Diagnosis not present

## 2017-04-06 DIAGNOSIS — R1314 Dysphagia, pharyngoesophageal phase: Secondary | ICD-10-CM | POA: Diagnosis not present

## 2017-04-06 DIAGNOSIS — K219 Gastro-esophageal reflux disease without esophagitis: Secondary | ICD-10-CM | POA: Diagnosis not present

## 2017-04-06 NOTE — Progress Notes (Signed)
Rhonda Morrow is 6 weeks status post left total hip replacement. She is still working with home physical therapy. She is doing well. She has mild pain and discomfort. She is mainly taking over-the-counter medicines. Her surgical scars fully healed. She is ambulating with a cane. Her x-ray show stable alignment of the hip replacement. Leg lengths are equal. Patient is doing well from my standpoint. Continue with home physical therapy. She may discontinue aspirin at this point. Follow-up in 6 weeks for 3 month checkup. No x-rays needed and she is having issues.

## 2017-04-09 ENCOUNTER — Encounter: Payer: Self-pay | Admitting: Neurology

## 2017-04-09 ENCOUNTER — Ambulatory Visit (INDEPENDENT_AMBULATORY_CARE_PROVIDER_SITE_OTHER): Payer: Federal, State, Local not specified - PPO | Admitting: Neurology

## 2017-04-09 VITALS — BP 140/90 | HR 79 | Ht 64.0 in | Wt 210.4 lb

## 2017-04-09 DIAGNOSIS — E1149 Type 2 diabetes mellitus with other diabetic neurological complication: Secondary | ICD-10-CM | POA: Diagnosis not present

## 2017-04-09 DIAGNOSIS — R1314 Dysphagia, pharyngoesophageal phase: Secondary | ICD-10-CM | POA: Diagnosis not present

## 2017-04-09 DIAGNOSIS — J45909 Unspecified asthma, uncomplicated: Secondary | ICD-10-CM | POA: Diagnosis not present

## 2017-04-09 DIAGNOSIS — Z471 Aftercare following joint replacement surgery: Secondary | ICD-10-CM | POA: Diagnosis not present

## 2017-04-09 DIAGNOSIS — R29818 Other symptoms and signs involving the nervous system: Secondary | ICD-10-CM | POA: Diagnosis not present

## 2017-04-09 DIAGNOSIS — M1990 Unspecified osteoarthritis, unspecified site: Secondary | ICD-10-CM | POA: Diagnosis not present

## 2017-04-09 DIAGNOSIS — M4802 Spinal stenosis, cervical region: Secondary | ICD-10-CM

## 2017-04-09 DIAGNOSIS — H8110 Benign paroxysmal vertigo, unspecified ear: Secondary | ICD-10-CM | POA: Diagnosis not present

## 2017-04-09 DIAGNOSIS — R292 Abnormal reflex: Secondary | ICD-10-CM | POA: Diagnosis not present

## 2017-04-09 DIAGNOSIS — E079 Disorder of thyroid, unspecified: Secondary | ICD-10-CM | POA: Diagnosis not present

## 2017-04-09 DIAGNOSIS — N3289 Other specified disorders of bladder: Secondary | ICD-10-CM | POA: Diagnosis not present

## 2017-04-09 DIAGNOSIS — M79672 Pain in left foot: Secondary | ICD-10-CM | POA: Diagnosis not present

## 2017-04-09 DIAGNOSIS — E010 Iodine-deficiency related diffuse (endemic) goiter: Secondary | ICD-10-CM | POA: Diagnosis not present

## 2017-04-09 DIAGNOSIS — M25562 Pain in left knee: Secondary | ICD-10-CM | POA: Diagnosis not present

## 2017-04-09 DIAGNOSIS — K219 Gastro-esophageal reflux disease without esophagitis: Secondary | ICD-10-CM | POA: Diagnosis not present

## 2017-04-09 DIAGNOSIS — I10 Essential (primary) hypertension: Secondary | ICD-10-CM | POA: Diagnosis not present

## 2017-04-09 NOTE — Progress Notes (Signed)
Follow-up Visit   Date: 04/09/17    Rhonda Morrow MRN: [id number] DOB: 02-Dec-1960   Interim History: Rhonda Morrow is a 56 y.o. right-handed African American female with diabetes mellitus, hypertension, GERD, and asthma  returning to the clinic for follow-up of headaches and neck pain. 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 The patient was accompanied to the clinic by sign language interpreter who also provides collateral information.    History of present illness: Starting around June 2017, she began having dizzy spells described has spinning with positional changes which is worse with aburpt head movements.  Symptoms last about 5 seconds, occurring about twice per day.  Symptoms are sporadic.  It is always triggered by bending her head, such as looking down, so is very careful with this. She also has severe shooting pain down her back with neck flexion. There is no nausea or vomiting.  She has been on meclizine, but does not recall whether it helps or not.    She also complains of bifrontal headaches, described as pulsating and pounding pain which last 5 seconds and are improved with resting.  Headaches are not worse with coughing, laughing, or sneezing.  Headaches are not severe enough for her to take any medications, but she is worried because she had not had headaches before.  CT head in December 2017 was normal.  UPDATE 04/09/2017:   Her headaches have significantly improved since having her left hip surgery and she feels that this may have been improved because she is on medical leave from work to recover from her hip surgery.   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 I ordered MRI cervical spine to look for compressive myelopathy at her last visit, but I did not receive these results and patient confirms that she had this performed in [facility]. I  requested my staff to call imaging centers in [facility], but none confirmed that she had MRI cervical spine.  She does not have any new neurological concerns.   Medications:  Current Outpatient Prescriptions on File Prior to Visit  Medication Sig Dispense Refill  . albuterol (PROVENTIL HFA;VENTOLIN HFA) 108 (90 Base) MCG/ACT inhaler Inhale 2 puffs into the lungs every 6 (six) hours as needed for wheezing. 1 Inhaler 3  . amLODipine (NORVASC) 5 MG tablet TAKE 1 TABLET (5 MG TOTAL) BY MOUTH DAILY. 90 tablet 1  . aspirin EC 325 MG tablet Take 1 tablet (325 mg total) by mouth 2 (two) times daily. 84 tablet 0  . azelastine (ASTELIN) 0.1 % nasal spray Place 2 sprays into both nostrils 2 (two) times daily. Use in each nostril as directed 30 mL 3  . cetirizine (ZYRTEC) 10 MG tablet Take 1 tablet (10 mg total) by mouth daily. 30 tablet 11  . cyclobenzaprine (FLEXERIL) 5 MG tablet Take 1 tablet (5 mg total) by mouth at bedtime as needed for muscle spasms. 30 tablet 5  . diazepam (VALIUM) 5 MG tablet Take 1 tablet 30-45 minutes prior to procedure.  May repeat x1. 2 tablet 0  . Fiber CHEW Chew 1 tablet by mouth daily.    . fluticasone (FLONASE) 50 MCG/ACT nasal spray PLACE 2 SPRAYS INTO BOTH NOSTRILS DAILY. 16 g 4  . glucose blood test strip Use as instructed--- one touch verio test strips 100 each 12  . hydrochlorothiazide (HYDRODIURIL) 12.5 MG tablet  Take 1 tablet (12.5 mg total) by mouth daily. 90 tablet 1  . ipratropium-albuterol (DUONEB) 0.5-2.5 (3) MG/3ML SOLN Take 3 mLs by nebulization every 6 (six) hours as needed. (Patient taking differently: Take 3 mLs by nebulization every 6 (six) hours as needed (for shortness of breath). ) 360 mL 3  . KLOR-CON M20 20 MEQ tablet TAKE 1 TABLET (20 MEQ TOTAL) BY MOUTH DAILY. 90 tablet 1  . meclizine (ANTIVERT) 25 MG tablet Take 1 tablet (25 mg total) by mouth 3 (three) times daily as needed. 45 tablet 3  . meloxicam (MOBIC) 15 MG tablet TAKE 1/2 TO 1 TABLET BY MOUTH  DAILY AS NEEDED FOR PAIN 30 tablet 2  . metFORMIN (GLUCOPHAGE-XR) 500 MG 24 hr tablet TAKE 1 TABLET (500 MG TOTAL) BY MOUTH DAILY WITH BREAKFAST. 90 tablet 1  . methocarbamol (ROBAXIN) 750 MG tablet Take 1 tablet (750 mg total) by mouth 2 (two) times daily as needed for muscle spasms. 60 tablet 0  . mometasone-formoterol (DULERA) 100-5 MCG/ACT AERO Inhale 2 puffs into the lungs 2 (two) times daily. 1 Inhaler 5  . montelukast (SINGULAIR) 10 MG tablet TAKE 1 TABLET (10 MG TOTAL) BY MOUTH AT BEDTIME. 30 tablet 2  . Multiple Vitamin (MULTIVITAMIN) tablet Take 1 tablet by mouth daily.    . NONFORMULARY OR COMPOUNDED ITEM Nebulizer   DX ASTHMA 1 each 0  . omeprazole (PRILOSEC) 20 MG capsule Take 1 capsule (20 mg total) by mouth daily. 30 capsule 2  . ondansetron (ZOFRAN) 4 MG tablet Take 1 tablet (4 mg total) by mouth every 8 (eight) hours as needed for nausea or vomiting. 10 tablet 0  . promethazine (PHENERGAN) 25 MG tablet Take 1 tablet (25 mg total) by mouth every 6 (six) hours as needed for nausea. 30 tablet 1  . ranitidine (ZANTAC) 150 MG capsule Take 1 capsule (150 mg total) by mouth 2 (two) times daily. 180 capsule 1  . senna-docusate (SENOKOT S) 8.6-50 MG tablet Take 1 tablet by mouth at bedtime as needed. 30 tablet 1  . Spacer/Aero-Holding Chambers (AEROCHAMBER MV) inhaler Use as instructed 1 each 0  . traMADol (ULTRAM) 50 MG tablet Take 1 tablet (50 mg total) by mouth at bedtime as needed. 30 tablet 0  . zolpidem (AMBIEN) 10 MG tablet Take 1 tablet (10 mg total) by mouth at bedtime as needed for sleep. 15 tablet 1   No current facility-administered medications on file prior to visit.     Allergies:  Allergies  Allergen Reactions  . Losartan Shortness Of Breath  . Oxycodone-Acetaminophen Nausea And Vomiting  . Augmentin [Amoxicillin-Pot Clavulanate] Diarrhea    Review of Systems:  CONSTITUTIONAL: No fevers, chills, night sweats, or weight loss.  EYES: No visual changes or eye  pain ENT: No hearing changes.  No history of nose bleeds.   RESPIRATORY: No cough, wheezing and shortness of breath.   CARDIOVASCULAR: Negative for chest pain, and palpitations.   GI: Negative for abdominal discomfort, blood in stools or black stools.  No recent change in bowel habits.   GU:  No history of incontinence.   MUSCLOSKELETAL: No history of joint pain or swelling.  No myalgias.   SKIN: Negative for lesions, rash, and itching.   ENDOCRINE: Negative for cold or heat intolerance, polydipsia or goiter.   PSYCH:  No depression or anxiety symptoms.   NEURO: As Above.   Vital Signs:  BP 140/90   Pulse 79   Ht 5\' 4"  (1.626 m)  Wt 210 lb 6 oz (95.4 kg)   LMP 05/20/2012   SpO2 97%   BMI 36.11 kg/m    Neurological Exam: MENTAL STATUS including orientation to time, place, person, recent and remote memory, attention span and concentration, language, and fund of knowledge is normal.  She is deaf and mute.  CRANIAL NERVES: Pupils equal round and reactive to light.  Normal conjugate, extra-ocular eye movements in all directions of gaze.  No ptosis. Normal facial sensation.  Face is symmetric. Palate elevates symmetrically.  Tongue is midline.  MOTOR:  Motor strength is 5/5 in all extremities.  No pronator drift.  Tone is normal.    MSRs:  Reflexes are 3+/4 in the upper extremities, 2+/4 at the patella, and 1+/4 at the Achilles.  SENSORY:  Intact to vibration throughout.  Lhermitte's sign positive.  COORDINATION/GAIT:  Normal finger-to- nose-finger.   Intact rapid alternating movements bilaterally.  Mild antalgic gait to the left hip surgery  IMPRESSION/PLAN: 1. Chronic neck pain with upper extremity hyperreflexia, concerning for cervical canal stenosis.  She endorses worsening neck pain with lifting and neck rotation, so need to exclude structural pathology.  I do not have results of her MRI cervical spine, which patient confirmed that she had performed.  We made several attempts to  contact the imaging centers in [facility] with no success.  I have requested the patient to see if she has any documentation of where her MRI was performed.  In the meantime, we will contact her insurance company to also inquire if there has been an order for MRI cervical spine that has been approved.   2.  Headaches and dizziness have resolved.    The duration of this appointment visit was 25 minutes of face-to-face time with the patient.  Greater than 50% of this time was spent in counseling, explanation of diagnosis, planning of further management, and coordination of care.   Thank you for allowing me to participate in patient's care.  If I can answer any additional questions, I would be pleased to do so.    Sincerely,    Donika K. Posey Pronto, DO

## 2017-04-11 DIAGNOSIS — N3289 Other specified disorders of bladder: Secondary | ICD-10-CM | POA: Diagnosis not present

## 2017-04-11 DIAGNOSIS — I10 Essential (primary) hypertension: Secondary | ICD-10-CM | POA: Diagnosis not present

## 2017-04-11 DIAGNOSIS — R1314 Dysphagia, pharyngoesophageal phase: Secondary | ICD-10-CM | POA: Diagnosis not present

## 2017-04-11 DIAGNOSIS — E1149 Type 2 diabetes mellitus with other diabetic neurological complication: Secondary | ICD-10-CM | POA: Diagnosis not present

## 2017-04-11 DIAGNOSIS — M1990 Unspecified osteoarthritis, unspecified site: Secondary | ICD-10-CM | POA: Diagnosis not present

## 2017-04-11 DIAGNOSIS — K219 Gastro-esophageal reflux disease without esophagitis: Secondary | ICD-10-CM | POA: Diagnosis not present

## 2017-04-11 DIAGNOSIS — E010 Iodine-deficiency related diffuse (endemic) goiter: Secondary | ICD-10-CM | POA: Diagnosis not present

## 2017-04-11 DIAGNOSIS — M25562 Pain in left knee: Secondary | ICD-10-CM | POA: Diagnosis not present

## 2017-04-11 DIAGNOSIS — E079 Disorder of thyroid, unspecified: Secondary | ICD-10-CM | POA: Diagnosis not present

## 2017-04-11 DIAGNOSIS — J45909 Unspecified asthma, uncomplicated: Secondary | ICD-10-CM | POA: Diagnosis not present

## 2017-04-11 DIAGNOSIS — M79672 Pain in left foot: Secondary | ICD-10-CM | POA: Diagnosis not present

## 2017-04-11 DIAGNOSIS — Z471 Aftercare following joint replacement surgery: Secondary | ICD-10-CM | POA: Diagnosis not present

## 2017-04-11 DIAGNOSIS — H8110 Benign paroxysmal vertigo, unspecified ear: Secondary | ICD-10-CM | POA: Diagnosis not present

## 2017-04-11 DIAGNOSIS — T888 Other specified complications of surgical and medical care, not elsewhere classified: Secondary | ICD-10-CM | POA: Diagnosis not present

## 2017-04-12 ENCOUNTER — Telehealth: Payer: Self-pay | Admitting: *Deleted

## 2017-04-12 ENCOUNTER — Other Ambulatory Visit: Payer: Self-pay | Admitting: Family Medicine

## 2017-04-12 NOTE — Telephone Encounter (Signed)
Received Physician Orders from [place], forwarded to provider/SLS 06/07

## 2017-04-13 ENCOUNTER — Ambulatory Visit (INDEPENDENT_AMBULATORY_CARE_PROVIDER_SITE_OTHER): Payer: Federal, State, Local not specified - PPO | Admitting: Behavioral Health

## 2017-04-13 DIAGNOSIS — E538 Deficiency of other specified B group vitamins: Secondary | ICD-10-CM | POA: Diagnosis not present

## 2017-04-13 MED ORDER — CYANOCOBALAMIN 1000 MCG/ML IJ SOLN
1000.0000 ug | Freq: Once | INTRAMUSCULAR | Status: AC
  Administered 2017-04-13: 1000 ug via INTRAMUSCULAR

## 2017-04-13 NOTE — Progress Notes (Signed)
Pre visit review using our clinic review tool, if applicable. No additional management support is needed unless otherwise documented below in the visit note.  Patient came in clinic for monthly B12 injection. IM injection given in the left deltoid. Patient tolerated it well. Next appointment scheduled for 05/15/17 at 3:00 PM.

## 2017-04-14 ENCOUNTER — Other Ambulatory Visit (INDEPENDENT_AMBULATORY_CARE_PROVIDER_SITE_OTHER): Payer: Self-pay | Admitting: Orthopaedic Surgery

## 2017-04-15 ENCOUNTER — Other Ambulatory Visit (INDEPENDENT_AMBULATORY_CARE_PROVIDER_SITE_OTHER): Payer: Self-pay | Admitting: Orthopaedic Surgery

## 2017-04-30 ENCOUNTER — Telehealth: Payer: Self-pay | Admitting: Family Medicine

## 2017-04-30 ENCOUNTER — Other Ambulatory Visit: Payer: Self-pay | Admitting: Family Medicine

## 2017-04-30 DIAGNOSIS — M25512 Pain in left shoulder: Secondary | ICD-10-CM

## 2017-04-30 DIAGNOSIS — I10 Essential (primary) hypertension: Secondary | ICD-10-CM

## 2017-04-30 MED ORDER — ZOLPIDEM TARTRATE 10 MG PO TABS: 10.0000 mg | ORAL_TABLET | Freq: Every evening | ORAL | 1 refills | Status: DC | PRN

## 2017-04-30 MED ORDER — TRAMADOL HCL 50 MG PO TABS: 50.0000 mg | ORAL_TABLET | Freq: Every evening | ORAL | 0 refills | Status: DC | PRN

## 2017-04-30 NOTE — Telephone Encounter (Signed)
Patient notified and rx faxed by [facility]

## 2017-04-30 NOTE — Telephone Encounter (Signed)
Refill Request: Zolpidem     Last RX: 01/10/17 Last OV:02/13/17 Next TP:NSQZ scheduled  YTM:MITV UDS CSC:11/23/16

## 2017-04-30 NOTE — Telephone Encounter (Signed)
Refill done but she should be able to use less of the tramadol and eventally come off

## 2017-04-30 NOTE — Telephone Encounter (Signed)
Caller name: Tresa Jolley Relationship to patient: self - using interpreter - sign lang Can be reached: [phone] Pharmacy: [facility]  Reason for call: Pt requesting refill on Tramadol, she is out of medication. She is requesting call back to notify when approved. She isn't sure if she has to p/u RX or if it will be sent to pharmacy.

## 2017-04-30 NOTE — Telephone Encounter (Signed)
Refill x1 

## 2017-04-30 NOTE — Telephone Encounter (Signed)
tramdol refill  Database on your desk  Last filled per database:  03/27/17 Last written: 03/27/17 #30  Last ov: 01/10/17 with Percell Miller Next ov: 06/04/17 Contract: 11/07/16 UDS: no UDS given  Would like for [place] to collect UDS?

## 2017-05-14 ENCOUNTER — Telehealth (INDEPENDENT_AMBULATORY_CARE_PROVIDER_SITE_OTHER): Payer: Self-pay | Admitting: Orthopaedic Surgery

## 2017-05-14 NOTE — Telephone Encounter (Signed)
Patient came in to office today stating someone had called her and told her records were ready. I did not call and there is no documention in chart where anyone has called her. She signed release form and she was given copies of records 02/21/2017 to present

## 2017-05-15 ENCOUNTER — Ambulatory Visit (INDEPENDENT_AMBULATORY_CARE_PROVIDER_SITE_OTHER): Payer: Federal, State, Local not specified - PPO | Admitting: Behavioral Health

## 2017-05-15 DIAGNOSIS — E538 Deficiency of other specified B group vitamins: Secondary | ICD-10-CM | POA: Diagnosis not present

## 2017-05-15 MED ORDER — CYANOCOBALAMIN 1000 MCG/ML IJ SOLN
1000.0000 ug | Freq: Once | INTRAMUSCULAR | Status: AC
  Administered 2017-05-15: 1000 ug via INTRAMUSCULAR

## 2017-05-15 NOTE — Progress Notes (Addendum)
Pre visit review using our clinic review tool, if applicable. No additional management support is needed unless otherwise documented below in the visit note.  Patient came in office for monthly B12 injection. IM injection was given in the right deltoid. Patient tolerated it well. Next appointment scheduled for 06/15/17 at 10:45 AM.   Lyman, DO

## 2017-05-18 ENCOUNTER — Ambulatory Visit (INDEPENDENT_AMBULATORY_CARE_PROVIDER_SITE_OTHER): Payer: Federal, State, Local not specified - PPO | Admitting: Orthopaedic Surgery

## 2017-05-18 ENCOUNTER — Encounter (INDEPENDENT_AMBULATORY_CARE_PROVIDER_SITE_OTHER): Payer: Self-pay | Admitting: Orthopaedic Surgery

## 2017-05-18 DIAGNOSIS — M1612 Unilateral primary osteoarthritis, left hip: Secondary | ICD-10-CM

## 2017-05-18 MED ORDER — AMOXICILLIN 500 MG PO CAPS: 2000.0000 mg | ORAL_CAPSULE | Freq: Once | ORAL | 0 refills | Status: AC

## 2017-05-18 NOTE — Progress Notes (Signed)
Mrs. Coppock is 3 months status post left total hip replacement. She states that she has some weakness in her thigh and quadriceps muscle. Complains of occasional burning and soreness and itching. She will like to return to work. She is currently walking with a cane. Surgical scar is fully healed. Leg lengths are equal. Walking at a normal place with a cane. No pain with range of motion of the hip. I reminded her of her dental prophylaxis. Patient may return to work on 05/23/2017 but no riding on tractor

## 2017-05-22 ENCOUNTER — Telehealth: Payer: Self-pay | Admitting: Family Medicine

## 2017-05-22 NOTE — Telephone Encounter (Signed)
[facility] [phone]  Called in because she said that PCP has completed a Home health Certification Form -for point of care. She says that the office has misplaced it by mistake. She would like to know if PCP could refax it to them if possible?

## 2017-05-31 NOTE — Telephone Encounter (Signed)
Left message on machine to call back  

## 2017-06-04 ENCOUNTER — Encounter: Payer: Self-pay | Admitting: Family Medicine

## 2017-06-04 ENCOUNTER — Ambulatory Visit (INDEPENDENT_AMBULATORY_CARE_PROVIDER_SITE_OTHER): Payer: Federal, State, Local not specified - PPO | Admitting: Family Medicine

## 2017-06-04 VITALS — BP 112/72 | HR 84 | Temp 98.0°F | Ht 64.0 in | Wt 206.0 lb

## 2017-06-04 DIAGNOSIS — Z96642 Presence of left artificial hip joint: Secondary | ICD-10-CM

## 2017-06-04 DIAGNOSIS — E538 Deficiency of other specified B group vitamins: Secondary | ICD-10-CM | POA: Diagnosis not present

## 2017-06-04 DIAGNOSIS — M25561 Pain in right knee: Secondary | ICD-10-CM | POA: Diagnosis not present

## 2017-06-04 DIAGNOSIS — D649 Anemia, unspecified: Secondary | ICD-10-CM

## 2017-06-04 MED ORDER — METAXALONE 800 MG PO TABS: 800.0000 mg | ORAL_TABLET | Freq: Three times a day (TID) | ORAL | 2 refills | Status: DC

## 2017-06-04 NOTE — Progress Notes (Signed)
Pre visit review using our clinic review tool, if applicable. No additional management support is needed unless otherwise documented below in the visit note. 

## 2017-06-04 NOTE — Progress Notes (Signed)
Patient ID: Rhonda Morrow, female    DOB: 12-Jun-1961  Age: 56 y.o. MRN: [id number]    Subjective:  Subjective  HPI Rhonda Morrow presents for difficulty sleeping secondary to L hip and r knee pain.  Pt has hip replacement in April and has not been happy since. Pt feels like L leg gives out on her and is weak and R knee   Review of Systems  Constitutional: Negative for appetite change, diaphoresis, fatigue and unexpected weight change.  Eyes: Negative for pain, redness and visual disturbance.  Respiratory: Negative for cough, chest tightness, shortness of breath and wheezing.   Cardiovascular: Negative for chest pain, palpitations and leg swelling.  Endocrine: Negative for cold intolerance, heat intolerance, polydipsia, polyphagia and polyuria.  Genitourinary: Negative for difficulty urinating, dysuria and frequency.  Musculoskeletal: Positive for arthralgias, back pain and gait problem.  Neurological: Negative for dizziness, light-headedness, numbness and headaches.    History Past Medical History:  Diagnosis Date  . Arthritis   . Asthma   . Complication of anesthesia    one time woke up and was vey scared,17 yrs ago  . Deaf   . Diabetes mellitus without complication ([place])   . GERD (gastroesophageal reflux disease)   . Hypertension   . Left groin pain   . Thyroid disease     She has a past surgical history that includes Cesarean section; Cardiac catheterization; Esophageal manometry (N/A, 09/29/2013); Cholecystectomy (N/A, 06/20/2016); and Total hip arthroplasty (Left, 02/21/2017).   Her family history includes Diabetes in her father and mother; Heart disease in her mother.She reports that she has never smoked. She has never used smokeless tobacco. She reports that she does not drink alcohol or use drugs.  Current Outpatient Prescriptions on File Prior to Visit  Medication Sig Dispense Refill  . albuterol (PROVENTIL HFA;VENTOLIN HFA) 108 (90 Base) MCG/ACT inhaler Inhale 2 puffs into  the lungs every 6 (six) hours as needed for wheezing. 1 Inhaler 3  . amLODipine (NORVASC) 5 MG tablet TAKE 1 TABLET (5 MG TOTAL) BY MOUTH DAILY. 90 tablet 1  . aspirin EC 325 MG tablet Take 1 tablet (325 mg total) by mouth 2 (two) times daily. 84 tablet 0  . azelastine (ASTELIN) 0.1 % nasal spray Place 2 sprays into both nostrils 2 (two) times daily. Use in each nostril as directed 30 mL 3  . cetirizine (ZYRTEC) 10 MG tablet Take 1 tablet (10 mg total) by mouth daily. 30 tablet 11  . [facility] ASPIRIN 325 MG tablet TAKE 1 TABLET BY MOUTH TWICE A DAY 100 tablet 0  . Fiber CHEW Chew 1 tablet by mouth daily.    . fluticasone (FLONASE) 50 MCG/ACT nasal spray PLACE 2 SPRAYS INTO BOTH NOSTRILS DAILY. 16 g 4  . glucose blood test strip Use as instructed--- one touch verio test strips 100 each 12  . hydrochlorothiazide (HYDRODIURIL) 12.5 MG tablet TAKE 1 TABLET BY MOUTH EVERY DAY 90 tablet 1  . ipratropium-albuterol (DUONEB) 0.5-2.5 (3) MG/3ML SOLN Take 3 mLs by nebulization every 6 (six) hours as needed. (Patient taking differently: Take 3 mLs by nebulization every 6 (six) hours as needed (for shortness of breath). ) 360 mL 3  . KLOR-CON M20 20 MEQ tablet TAKE 1 TABLET (20 MEQ TOTAL) BY MOUTH DAILY. 90 tablet 1  . meclizine (ANTIVERT) 25 MG tablet Take 1 tablet (25 mg total) by mouth 3 (three) times daily as needed. 45 tablet 3  . meloxicam (MOBIC) 15 MG tablet TAKE 1/2 TO 1  TABLET BY MOUTH DAILY AS NEEDED FOR PAIN 30 tablet 2  . metFORMIN (GLUCOPHAGE-XR) 500 MG 24 hr tablet TAKE 1 TABLET (500 MG TOTAL) BY MOUTH DAILY WITH BREAKFAST. 90 tablet 1  . mometasone-formoterol (DULERA) 100-5 MCG/ACT AERO Inhale 2 puffs into the lungs 2 (two) times daily. 1 Inhaler 5  . montelukast (SINGULAIR) 10 MG tablet TAKE 1 TABLET (10 MG TOTAL) BY MOUTH AT BEDTIME. 30 tablet 2  . Multiple Vitamin (MULTIVITAMIN) tablet Take 1 tablet by mouth daily.    . NONFORMULARY OR COMPOUNDED ITEM Nebulizer   DX ASTHMA 1 each 0  . omeprazole  (PRILOSEC) 20 MG capsule Take 1 capsule (20 mg total) by mouth daily. 30 capsule 2  . ondansetron (ZOFRAN) 4 MG tablet Take 1 tablet (4 mg total) by mouth every 8 (eight) hours as needed for nausea or vomiting. 10 tablet 0  . promethazine (PHENERGAN) 25 MG tablet Take 1 tablet (25 mg total) by mouth every 6 (six) hours as needed for nausea. 30 tablet 1  . ranitidine (ZANTAC) 150 MG capsule Take 1 capsule (150 mg total) by mouth 2 (two) times daily. 180 capsule 1  . senna-docusate (SENOKOT S) 8.6-50 MG tablet Take 1 tablet by mouth at bedtime as needed. 30 tablet 1  . Spacer/Aero-Holding Chambers (AEROCHAMBER MV) inhaler Use as instructed 1 each 0  . traMADol (ULTRAM) 50 MG tablet Take 1 tablet (50 mg total) by mouth at bedtime as needed. 30 tablet 0  . zolpidem (AMBIEN) 10 MG tablet Take 1 tablet (10 mg total) by mouth at bedtime as needed for sleep. 15 tablet 1   No current facility-administered medications on file prior to visit.      Objective:  Objective  Physical Exam  Constitutional: She is oriented to person, place, and time. She appears well-developed and well-nourished.  HENT:  Head: Normocephalic and atraumatic.  Eyes: Conjunctivae and EOM are normal.  Neck: Normal range of motion. Neck supple. No JVD present. Carotid bruit is not present. No thyromegaly present.  Cardiovascular: Normal rate, regular rhythm and normal heart sounds.   No murmur heard. Pulmonary/Chest: Effort normal and breath sounds normal. No respiratory distress. She has no wheezes. She has no rales. She exhibits no tenderness.  Musculoskeletal: She exhibits tenderness. She exhibits no edema.       Left hip: She exhibits decreased strength and tenderness. She exhibits normal range of motion.       Right knee: Tenderness found.  Neurological: She is alert and oriented to person, place, and time.  Psychiatric: She has a normal mood and affect.  Nursing note and vitals reviewed.  BP 112/72 (BP Location: Left  Arm, Patient Position: Sitting, Cuff Size: Large)   Pulse 84   Temp 98 F (36.7 C) (Oral)   Ht 5\' 4"  (1.626 m)   Wt 206 lb (93.4 kg)   LMP 05/20/2012   SpO2 99%   BMI 35.36 kg/m  Wt Readings from Last 3 Encounters:  06/04/17 206 lb (93.4 kg)  04/09/17 210 lb 6 oz (95.4 kg)  02/21/17 221 lb (100.2 kg)     Lab Results  Component Value Date   WBC 9.2 02/22/2017   HGB 11.4 (L) 02/22/2017   HCT 35.9 (L) 02/22/2017   PLT 208 02/22/2017   GLUCOSE 107 (H) 02/22/2017   CHOL 175 12/04/2016   TRIG 80.0 12/04/2016   HDL 68.10 12/04/2016   LDLCALC 91 12/04/2016   ALT 24 02/12/2017   AST 25 02/12/2017  NA 137 02/22/2017   K 3.9 02/22/2017   CL 103 02/22/2017   CREATININE 1.30 (H) 02/22/2017   BUN 23 (H) 02/22/2017   CO2 25 02/22/2017   TSH 0.87 12/04/2016   INR 1.05 02/12/2017   HGBA1C 6.3 (H) 02/12/2017   MICROALBUR 0.8 10/27/2015    No results found.   Assessment & Plan:  Plan  I have discontinued Ms. Elie's diazepam, cyclobenzaprine, and methocarbamol. I am also having her start on metaxalone. Additionally, I am having her maintain her meclizine, multivitamin, Fiber, ipratropium-albuterol, NONFORMULARY OR COMPOUNDED ITEM, AEROCHAMBER MV, omeprazole, azelastine, cetirizine, fluticasone, glucose blood, albuterol, mometasone-formoterol, ondansetron, amLODipine, ranitidine, aspirin EC, promethazine, senna-docusate, montelukast, meloxicam, metFORMIN, KLOR-CON M20, [facility] ASPIRIN, hydrochlorothiazide, traMADol, and zolpidem.  Meds ordered this encounter  Medications  . metaxalone (SKELAXIN) 800 MG tablet    Sig: Take 1 tablet (800 mg total) by mouth 3 (three) times daily.    Dispense:  30 tablet    Refill:  2    Problem List Items Addressed This Visit      Unprioritized   Knee pain, right   Relevant Medications   metaxalone (SKELAXIN) 800 MG tablet   Other Relevant Orders   Ambulatory referral to Physical Therapy   Ambulatory referral to Orthopedic Surgery    Other  Visit Diagnoses    History of left hip replacement    -  Primary   Relevant Medications   metaxalone (SKELAXIN) 800 MG tablet   Other Relevant Orders   Ambulatory referral to Physical Therapy   Ambulatory referral to Orthopedic Surgery   Anemia, unspecified type       Relevant Orders   CBC with Differential/Platelet   IBC panel   Ferritin   Vitamin B12   B12 deficiency       Relevant Orders   CBC with Differential/Platelet   IBC panel   Ferritin   Vitamin B12      Follow-up: Return if symptoms worsen or fail to improve.  Ann Held, DO

## 2017-06-04 NOTE — Patient Instructions (Signed)
Hip Pain The hip is the joint between the upper legs and the lower pelvis. The bones, cartilage, tendons, and muscles of your hip joint support your body and allow you to move around. Hip pain can range from a minor ache to severe pain in one or both of your hips. The pain may be felt on the inside of the hip joint near the groin, or the outside near the buttocks and upper thigh. You may also have swelling or stiffness. Follow these instructions at home: Managing pain, stiffness, and swelling   If directed, apply ice to the injured area.  Put ice in a plastic bag.  Place a towel between your skin and the bag.  Leave the ice on for 20 minutes, 2-3 times a day  Sleep with a pillow between your legs on your most comfortable side.  Avoid any activities that cause pain. General instructions   Take over-the-counter and prescription medicines only as told by your health care provider.  Do any exercises as told by your health care provider.  Record the following:  How often you have hip pain.  The location of your pain.  What the pain feels like.  What makes the pain worse.  Keep all follow-up visits as told by your health care provider. This is important. Contact a health care provider if:  You cannot put weight on your leg.  Your pain or swelling continues or gets worse after one week.  It gets harder to walk.  You have a fever. Get help right away if:  You fall.  You have a sudden increase in pain and swelling in your hip.  Your hip is red or swollen or very tender to touch. Summary  Hip pain can range from a minor ache to severe pain in one or both of your hips.  The pain may be felt on the inside of the hip joint near the groin, or the outside near the buttocks and upper thigh.  Avoid any activities that cause pain.  Record how often you have hip pain, the location of the pain, what makes it worse and what it feels like. This information is not intended to  replace advice given to you by your health care provider. Make sure you discuss any questions you have with your health care provider. Document Released: 04/12/2010 Document Revised: 09/25/2016 Document Reviewed: 09/25/2016 Elsevier Interactive Patient Education  2017 [employer]  

## 2017-06-08 ENCOUNTER — Ambulatory Visit: Payer: Self-pay | Admitting: Adult Health

## 2017-06-11 ENCOUNTER — Encounter (INDEPENDENT_AMBULATORY_CARE_PROVIDER_SITE_OTHER): Payer: Self-pay | Admitting: Orthopaedic Surgery

## 2017-06-11 ENCOUNTER — Ambulatory Visit (INDEPENDENT_AMBULATORY_CARE_PROVIDER_SITE_OTHER): Payer: Federal, State, Local not specified - PPO

## 2017-06-11 ENCOUNTER — Ambulatory Visit (INDEPENDENT_AMBULATORY_CARE_PROVIDER_SITE_OTHER): Payer: Federal, State, Local not specified - PPO | Admitting: Orthopaedic Surgery

## 2017-06-11 DIAGNOSIS — M25552 Pain in left hip: Secondary | ICD-10-CM

## 2017-06-11 DIAGNOSIS — Z96642 Presence of left artificial hip joint: Secondary | ICD-10-CM

## 2017-06-11 DIAGNOSIS — M25561 Pain in right knee: Secondary | ICD-10-CM

## 2017-06-11 DIAGNOSIS — M25652 Stiffness of left hip, not elsewhere classified: Secondary | ICD-10-CM | POA: Diagnosis not present

## 2017-06-11 DIAGNOSIS — M25661 Stiffness of right knee, not elsewhere classified: Secondary | ICD-10-CM | POA: Diagnosis not present

## 2017-06-11 MED ORDER — MELOXICAM 7.5 MG PO TABS: 15.0000 mg | ORAL_TABLET | Freq: Every day | ORAL | 2 refills | Status: DC | PRN

## 2017-06-11 NOTE — Progress Notes (Signed)
Office Visit Note   Patient: Rhonda Morrow           Date of Birth: 06-Dec-1960           MRN: [id number] Visit Date: 06/11/2017              Requested by: [address], [place], [place] [address] [place], [place] [address] PCP: Carollee Herter, Alferd Apa, DO   Assessment & Plan: Visit Diagnoses:  1. Acute pain of right knee   2. Status post total hip replacement, left   3. Left hip pain     Plan: overall impression is right patellar tendinitis and left quadriceps tendinitis from generalize overuse and weakness from recovered from the surgery. Meloxicam was refilled. Physical therapy for quad and patellar strengthening. Follow-up in 3 months with standing AP pelvis x-ray.  Follow-Up Instructions: Return in about 3 months (around 09/11/2017).   Orders:  Orders Placed This Encounter  Procedures  . XR KNEE 3 VIEW RIGHT   Meds ordered this encounter  Medications  . meloxicam (MOBIC) 7.5 MG tablet    Sig: Take 2 tablets (15 mg total) by mouth daily as needed for pain.    Dispense:  30 tablet    Refill:  2      Procedures: No procedures performed   Clinical Data: No additional findings.   Subjective: Chief Complaint  Patient presents with  . Left Hip - Pain, Follow-up  . Right Knee - Pain    Patient is on 110 days status post left total hip replacement. She is overall doing well. She started physical therapy today. She is complaining mainly of right knee pain and left distal thigh pain. This is worse with climbing stairs. She denies any injuries. She is able to weight-bear.    Review of Systems  Constitutional: Negative.   HENT: Negative.   Eyes: Negative.   Respiratory: Negative.   Cardiovascular: Negative.   Endocrine: Negative.   Musculoskeletal: Negative.   Neurological: Negative.   Hematological: Negative.   Psychiatric/Behavioral: Negative.   All other systems reviewed and are negative.    Objective: Vital Signs: LMP 05/20/2012   Physical  Exam  Constitutional: She is oriented to person, place, and time. She appears well-developed and well-nourished.  Pulmonary/Chest: Effort normal.  Neurological: She is alert and oriented to person, place, and time.  Skin: Skin is warm. Capillary refill takes less than 2 seconds.  Psychiatric: She has a normal mood and affect. Her behavior is normal. Judgment and thought content normal.  Nursing note and vitals reviewed.   Ortho Exam Bilateral knee exam no joint effusion. She has focal findings. She is tender over the right patellar tendon insertion of the patella.she is also tender over the quadriceps tendon. Specialty Comments:  No specialty comments available.  Imaging: Xr Knee 3 View Right  Result Date: 06/11/2017  negative    PMFS History: Patient Active Problem List   Diagnosis Date Noted  . Primary osteoarthritis of left hip 02/21/2017  . 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 History of laparoscopic cholecystectomy 06/20/2016  . Cerumen impaction 06/09/2016  . Hypersomnia 05/16/2016  . Knee pain, right 05/05/2016  . RUQ pain 04/09/2016  . Abnormal CT scan 03/28/2016  . Dyspnea and respiratory abnormalities 03/15/2016  . Asthma with acute exacerbation 03/15/2016  . Asthma in adult 02/25/2016  . DM (diabetes mellitus) type II controlled, neurological manifestation ([place]) 02/09/2016  . Left hip pain 12/23/2015  .  Right hamstring muscle strain 11/18/2015  . Abdominal pain, acute 09/01/2015  . Acute asthma exacerbation 04/16/2015  . Acute bronchitis 04/14/2015  . Vaginal discharge 09/14/2014  . Pap smear for cervical cancer screening 09/14/2014  . Scabies 05/06/2014  . Bed bug bite 05/06/2014  . Allergic rhinitis 04/09/2014  . Rectal itching 04/09/2014  . Bladder spasm 04/09/2014  . Knee pain, left 03/05/2014  . Hypokalemia 02/19/2014  . Nausea with vomiting 02/19/2014  . Diabetes mellitus, type II ([place]) 02/19/2014  . Edema 02/16/2014  . Disorder of  rotator cuff 11/12/2013  . Routine general medical examination at a health care facility 08/20/2013  . GERD (gastroesophageal reflux disease) 06/26/2013  . Dysphagia, pharyngoesophageal phase 06/26/2013  . Suprapubic pain 05/12/2013  . Medication side effect 03/25/2013  . Diarrhea 03/25/2013  . Benign positional vertigo 01/30/2013  . Traumatic hematoma of thigh 01/15/2013  . HTN (hypertension) 09/02/2012  . Dry skin 08/22/2012  . External hemorrhoids 08/22/2012  . Obesity 06/30/2012  . Thyromegaly 05/22/2012  . Back pain 05/22/2012  . Elevated glucose 05/22/2012  . Moderate persistent asthma 04/19/2012  . Dyspnea 01/28/2012   Past Medical History:  Diagnosis Date  . Arthritis   . Asthma   . Complication of anesthesia    one time woke up and was vey scared,17 yrs ago  . Deaf   . Diabetes mellitus without complication ([place])   . GERD (gastroesophageal reflux disease)   . Hypertension   . Left groin pain   . Thyroid disease     Family History  Problem Relation Age of Onset  . Diabetes Mother   . Heart disease Mother   . Diabetes Father     Past Surgical History:  Procedure Laterality Date  . CARDIAC CATHETERIZATION    . CESAREAN SECTION    . CHOLECYSTECTOMY N/A 06/20/2016   Procedure: LAPAROSCOPIC CHOLECYSTECTOMY;  Surgeon: Rolm Bookbinder, MD;  Location: [facility];  Service: General;  Laterality: N/A;  . ESOPHAGEAL MANOMETRY N/A 09/29/2013   Procedure: ESOPHAGEAL MANOMETRY (EM);  Surgeon: Milus Banister, MD;  Location: [facility];  Service: Endoscopy;  Laterality: N/A;  . TOTAL HIP ARTHROPLASTY Left 02/21/2017   Procedure: LEFT TOTAL HIP ARTHROPLASTY ANTERIOR APPROACH;  Surgeon: Leandrew Koyanagi, MD;  Location: [facility];  Service: Orthopedics;  Laterality: Left;   Social History   Occupational History  . disabled    Social History Main Topics  . Smoking status: Never Smoker  . Smokeless tobacco: Never Used  . Alcohol use No  . Drug use: No  . Sexual activity: No

## 2017-06-12 ENCOUNTER — Ambulatory Visit (INDEPENDENT_AMBULATORY_CARE_PROVIDER_SITE_OTHER): Payer: Federal, State, Local not specified - PPO

## 2017-06-12 ENCOUNTER — Telehealth: Payer: Self-pay | Admitting: *Deleted

## 2017-06-12 DIAGNOSIS — E538 Deficiency of other specified B group vitamins: Secondary | ICD-10-CM | POA: Diagnosis not present

## 2017-06-12 MED ORDER — CYANOCOBALAMIN 1000 MCG/ML IJ SOLN
1000.0000 ug | Freq: Once | INTRAMUSCULAR | Status: AC
  Administered 2017-06-12: 1000 ug via INTRAMUSCULAR

## 2017-06-12 NOTE — Progress Notes (Signed)
Pre visit review using our clinic tool,if applicable. No additional management support is needed unless otherwise documented below in the visit note.   Patient in for B12 injection  Per order from Dr. Carollee Herter due to B12 deficiency.   Given 1000 mcg IM right deltoid. No complaints voiced. Patient tolerated well.  Return appointment given for 1 month.

## 2017-06-12 NOTE — Telephone Encounter (Signed)
Received Physical Therapy Plan of Care; forwarded to covering provider/SLS 08/07

## 2017-06-15 ENCOUNTER — Ambulatory Visit: Payer: Self-pay

## 2017-06-20 DIAGNOSIS — M25552 Pain in left hip: Secondary | ICD-10-CM | POA: Diagnosis not present

## 2017-06-20 DIAGNOSIS — M25661 Stiffness of right knee, not elsewhere classified: Secondary | ICD-10-CM | POA: Diagnosis not present

## 2017-06-20 DIAGNOSIS — M25561 Pain in right knee: Secondary | ICD-10-CM | POA: Diagnosis not present

## 2017-06-20 DIAGNOSIS — M25652 Stiffness of left hip, not elsewhere classified: Secondary | ICD-10-CM | POA: Diagnosis not present

## 2017-06-22 ENCOUNTER — Other Ambulatory Visit: Payer: Self-pay | Admitting: Family Medicine

## 2017-06-25 ENCOUNTER — Encounter: Payer: Self-pay | Admitting: Adult Health

## 2017-06-25 ENCOUNTER — Ambulatory Visit (INDEPENDENT_AMBULATORY_CARE_PROVIDER_SITE_OTHER): Payer: Federal, State, Local not specified - PPO | Admitting: Adult Health

## 2017-06-25 DIAGNOSIS — J301 Allergic rhinitis due to pollen: Secondary | ICD-10-CM

## 2017-06-25 DIAGNOSIS — M25661 Stiffness of right knee, not elsewhere classified: Secondary | ICD-10-CM | POA: Diagnosis not present

## 2017-06-25 DIAGNOSIS — M25552 Pain in left hip: Secondary | ICD-10-CM | POA: Diagnosis not present

## 2017-06-25 DIAGNOSIS — M25652 Stiffness of left hip, not elsewhere classified: Secondary | ICD-10-CM | POA: Diagnosis not present

## 2017-06-25 DIAGNOSIS — J4540 Moderate persistent asthma, uncomplicated: Secondary | ICD-10-CM | POA: Diagnosis not present

## 2017-06-25 DIAGNOSIS — M25561 Pain in right knee: Secondary | ICD-10-CM | POA: Diagnosis not present

## 2017-06-25 MED ORDER — MONTELUKAST SODIUM 10 MG PO TABS: 10.0000 mg | ORAL_TABLET | Freq: Every day | ORAL | 5 refills | Status: DC

## 2017-06-25 MED ORDER — MOMETASONE FURO-FORMOTEROL FUM 100-5 MCG/ACT IN AERO: 2.0000 | INHALATION_SPRAY | Freq: Two times a day (BID) | RESPIRATORY_TRACT | 5 refills | Status: DC

## 2017-06-25 MED ORDER — ALBUTEROL SULFATE HFA 108 (90 BASE) MCG/ACT IN AERS: 2.0000 | INHALATION_SPRAY | Freq: Four times a day (QID) | RESPIRATORY_TRACT | 5 refills | Status: DC | PRN

## 2017-06-25 NOTE — Addendum Note (Signed)
Addended by: Parke Poisson E on: 06/25/2017 05:33 PM   Modules accepted: Orders

## 2017-06-25 NOTE — Assessment & Plan Note (Signed)
Doing well on [place]  . Patient Instructions  Continue on current regimen.  Continue on Dulera 2 puffs Twice daily  , rinse after use.  Follow up Dr. Chase Caller in 6 months and As needed

## 2017-06-25 NOTE — Progress Notes (Signed)
@Patient  ID: Rhonda Morrow, female    DOB: 12-06-1960, 56 y.o.   MRN: [id number]  Chief Complaint  Patient presents with  . Follow-up    Asthma     Referring provider: Ann Held, *  HPI: 57 year old female never smoker with known asthma Patient is deaf and mute  Test PFt date 01/24/12 shows mixed obstruction - restriction but greater restriction with low dlco  - fev1 1/48L/58%, Ratio 82, TLC 56, DLCO 14/51%, No BD response  CT chest 02/09/2012 showed no evidence of scarring  06/25/2017 Follow up : Asthma  Interpreter present for today's visit Patient returns for a 6 month follow up. Overall, she says that she is doing okay. She remains on Dulera twice daily. He does have occasional cough and congestion with thick mucus gets in her throat. Remains on Singulair and Zyrtec. She denies any increased wheezing, shortness of breath or chest pain.  Allergies  Allergen Reactions  . Losartan Shortness Of Breath  . Oxycodone-Acetaminophen Nausea And Vomiting  . Augmentin [Amoxicillin-Pot Clavulanate] Diarrhea    Immunization History  Administered Date(s) Administered  . DT 04/07/2011  . Influenza Split 08/07/2011, 08/05/2012, 08/22/2012, 08/31/2014  . Influenza,inj,Quad PF,36+ Mos 08/20/2013, 10/22/2015, 08/01/2016  . Pneumococcal Polysaccharide-23 10/02/2012  . Tdap 12/18/2011    Past Medical History:  Diagnosis Date  . Arthritis   . Asthma   . Complication of anesthesia    one time woke up and was vey scared,17 yrs ago  . Deaf   . Diabetes mellitus without complication ([place])   . GERD (gastroesophageal reflux disease)   . Hypertension   . Left groin pain   . Thyroid disease     Tobacco History: History  Smoking Status  . Never Smoker  Smokeless Tobacco  . Never Used   Counseling given: Not Answered   Outpatient Encounter Prescriptions as of 06/25/2017  Medication Sig  . albuterol (PROVENTIL HFA;VENTOLIN HFA) 108 (90 Base) MCG/ACT inhaler Inhale 2  puffs into the lungs every 6 (six) hours as needed for wheezing.  Marland Kitchen amLODipine (NORVASC) 5 MG tablet TAKE 1 TABLET (5 MG TOTAL) BY MOUTH DAILY.  Marland Kitchen aspirin EC 325 MG tablet Take 1 tablet (325 mg total) by mouth 2 (two) times daily.  Marland Kitchen azelastine (ASTELIN) 0.1 % nasal spray Place 2 sprays into both nostrils 2 (two) times daily. Use in each nostril as directed  . cetirizine (ZYRTEC) 10 MG tablet Take 1 tablet (10 mg total) by mouth daily.  . [facility] ASPIRIN 325 MG tablet TAKE 1 TABLET BY MOUTH TWICE A DAY  . Fiber CHEW Chew 1 tablet by mouth daily.  . fluticasone (FLONASE) 50 MCG/ACT nasal spray PLACE 2 SPRAYS INTO BOTH NOSTRILS DAILY.  Marland Kitchen glucose blood test strip Use as instructed--- one touch verio test strips  . hydrochlorothiazide (HYDRODIURIL) 12.5 MG tablet TAKE 1 TABLET BY MOUTH EVERY DAY  . ipratropium-albuterol (DUONEB) 0.5-2.5 (3) MG/3ML SOLN Take 3 mLs by nebulization every 6 (six) hours as needed.  Marland Kitchen KLOR-CON M20 20 MEQ tablet TAKE 1 TABLET (20 MEQ TOTAL) BY MOUTH DAILY.  . meclizine (ANTIVERT) 25 MG tablet Take 1 tablet (25 mg total) by mouth 3 (three) times daily as needed.  . meloxicam (MOBIC) 7.5 MG tablet Take 2 tablets (15 mg total) by mouth daily as needed for pain.  . metaxalone (SKELAXIN) 800 MG tablet Take 1 tablet (800 mg total) by mouth 3 (three) times daily.  . metFORMIN (GLUCOPHAGE-XR) 500 MG 24 hr tablet TAKE 1  TABLET (500 MG TOTAL) BY MOUTH DAILY WITH BREAKFAST.  . mometasone-formoterol (DULERA) 100-5 MCG/ACT AERO Inhale 2 puffs into the lungs 2 (two) times daily.  . montelukast (SINGULAIR) 10 MG tablet TAKE 1 TABLET (10 MG TOTAL) BY MOUTH AT BEDTIME.  . Multiple Vitamin (MULTIVITAMIN) tablet Take 1 tablet by mouth daily.  . NONFORMULARY OR COMPOUNDED ITEM Nebulizer   DX ASTHMA  . omeprazole (PRILOSEC) 20 MG capsule Take 1 capsule (20 mg total) by mouth daily.  . ondansetron (ZOFRAN) 4 MG tablet Take 1 tablet (4 mg total) by mouth every 8 (eight) hours as needed for nausea or  vomiting.  . promethazine (PHENERGAN) 25 MG tablet Take 1 tablet (25 mg total) by mouth every 6 (six) hours as needed for nausea.  . ranitidine (ZANTAC) 150 MG capsule TAKE ONE CAPSULE BY MOUTH TWICE A DAY  . senna-docusate (SENOKOT S) 8.6-50 MG tablet Take 1 tablet by mouth at bedtime as needed.  Marland Kitchen Spacer/Aero-Holding Chambers (AEROCHAMBER MV) inhaler Use as instructed  . traMADol (ULTRAM) 50 MG tablet Take 1 tablet (50 mg total) by mouth at bedtime as needed.  . zolpidem (AMBIEN) 10 MG tablet Take 1 tablet (10 mg total) by mouth at bedtime as needed for sleep.   No facility-administered encounter medications on file as of 06/25/2017.      Review of Systems  Constitutional:   No  weight loss, night sweats,  Fevers, chills, fatigue, or  lassitude.  HEENT:   No headaches,  Difficulty swallowing,  Tooth/dental problems, or  Sore throat,                No sneezing, itching, ear ache,  +nasal congestion, post nasal drip,   CV:  No chest pain,  Orthopnea, PND, swelling in lower extremities, anasarca, dizziness, palpitations, syncope.   GI  No heartburn, indigestion, abdominal pain, nausea, vomiting, diarrhea, change in bowel habits, loss of appetite, bloody stools.   Resp:  No non-productive cough,  No coughing up of blood.  No change in color of mucus.  No wheezing.  No chest wall deformity  Skin: no rash or lesions.  GU: no dysuria, change in color of urine, no urgency or frequency.  No flank pain, no hematuria   MS:  No joint pain or swelling.  No decreased range of motion.  No back pain.    Physical Exam  BP 128/70 (BP Location: Left Arm, Patient Position: Sitting, Cuff Size: Normal)   Pulse 78   Ht 5\' 4"  (1.626 m)   Wt 206 lb 3.2 oz (93.5 kg)   LMP 05/20/2012   SpO2 98%   BMI 35.39 kg/m   GEN: A/Ox3; pleasant , NAD, obese    HEENT:  [place]/AT,  EACs-clear, TMs-wnl, NOSE-clear, THROAT-clear, no lesions, no postnasal drip or exudate noted.   NECK:  Supple w/ fair ROM; no  JVD; normal carotid impulses w/o bruits; no thyromegaly or nodules palpated; no lymphadenopathy.    RESP  Clear  P & A; w/o, wheezes/ rales/ or rhonchi. no accessory muscle use, no dullness to percussion  CARD:  RRR, no m/r/g, no peripheral edema, pulses intact, no cyanosis or clubbing.  GI:   Soft & nt; nml bowel sounds; no organomegaly or masses detected.   Musco: Warm bil, no deformities or joint swelling noted.   Neuro: alert, no focal deficits noted.    Skin: Warm, no lesions or rashes    Lab Results:  CBC  BMET   BNP Imaging: Xr Knee 3  View Right  Result Date: 06/11/2017  negative    Assessment & Plan:   Moderate persistent asthma Doing well on [place]  . Patient Instructions  Continue on current regimen.  Continue on Dulera 2 puffs Twice daily  , rinse after use.  Follow up Dr. Chase Caller in 6 months and As needed       Allergic rhinitis Stable without flare   Plan  .cont on current regimen      Rexene Edison, NP 06/25/2017

## 2017-06-25 NOTE — Assessment & Plan Note (Signed)
Stable without flare   Plan  .cont on current regimen

## 2017-06-25 NOTE — Patient Instructions (Signed)
Continue on current regimen.  Continue on Dulera 2 puffs Twice daily  , rinse after use.  Follow up Dr. Chase Caller in 6 months and As needed

## 2017-06-27 DIAGNOSIS — M25552 Pain in left hip: Secondary | ICD-10-CM | POA: Diagnosis not present

## 2017-06-27 DIAGNOSIS — M25661 Stiffness of right knee, not elsewhere classified: Secondary | ICD-10-CM | POA: Diagnosis not present

## 2017-06-27 DIAGNOSIS — M25561 Pain in right knee: Secondary | ICD-10-CM | POA: Diagnosis not present

## 2017-06-27 DIAGNOSIS — M25652 Stiffness of left hip, not elsewhere classified: Secondary | ICD-10-CM | POA: Diagnosis not present

## 2017-06-29 ENCOUNTER — Ambulatory Visit: Payer: Self-pay | Admitting: Internal Medicine

## 2017-07-17 ENCOUNTER — Ambulatory Visit (INDEPENDENT_AMBULATORY_CARE_PROVIDER_SITE_OTHER): Payer: Federal, State, Local not specified - PPO

## 2017-07-17 DIAGNOSIS — E538 Deficiency of other specified B group vitamins: Secondary | ICD-10-CM | POA: Diagnosis not present

## 2017-07-17 MED ORDER — CYANOCOBALAMIN 1000 MCG/ML IJ SOLN
1000.0000 ug | Freq: Once | INTRAMUSCULAR | Status: AC
  Administered 2017-07-17: 1000 ug via INTRAMUSCULAR

## 2017-07-17 NOTE — Progress Notes (Signed)
Pre visit review using our clinic tool,if applicable. No additional management support is needed unless otherwise documented below in the visit note.   Patient in for B12 injection per order from Dr. Carollee Herter due to patient having B12 deficiency.  Patient has not complaints this am.  Given 1000 mcg IM Right deltoid. Patient tolerated well.  Return appointment scheduled with patient for 1 month.

## 2017-08-08 ENCOUNTER — Other Ambulatory Visit: Payer: Self-pay | Admitting: Family Medicine

## 2017-08-09 ENCOUNTER — Other Ambulatory Visit: Payer: Self-pay | Admitting: *Deleted

## 2017-08-09 DIAGNOSIS — I10 Essential (primary) hypertension: Secondary | ICD-10-CM

## 2017-08-09 MED ORDER — HYDROCHLOROTHIAZIDE 12.5 MG PO TABS: 12.5000 mg | ORAL_TABLET | Freq: Every day | ORAL | 1 refills | Status: DC

## 2017-08-09 NOTE — Progress Notes (Signed)
Received letter in mail from [facility] stating that recalled HCTZ 12.5 mg tablets [bottles had Spironolactone 25 mg tablets in bottles] with lot number [id number] px during the period of 12/27/16 through 06/30/17 shows patient has been identified and listed as "having had a prescription filled recently for HCTZ 12.5 mg tabs at their participating retail pharmacy"[04/30/17]. Called and spoke with Jenny Reichmann at [facility] and gave her this information. She stated that letters were sent out to patients as well with instructions to follow. Gave pharmacy VO per authorization from PCP for new Rx with same instructions and dispense/RF amounts; understood & agreed/SLS 10/04

## 2017-08-10 NOTE — Telephone Encounter (Signed)
Requesting: Zolpidem Contract: Yes UDS: No Last OV: 7.30.18 Next OV: Not scheduled Last Refill: 12.22.2018   Please advise

## 2017-08-14 ENCOUNTER — Ambulatory Visit (INDEPENDENT_AMBULATORY_CARE_PROVIDER_SITE_OTHER): Payer: Federal, State, Local not specified - PPO

## 2017-08-14 DIAGNOSIS — E538 Deficiency of other specified B group vitamins: Secondary | ICD-10-CM

## 2017-08-14 MED ORDER — CYANOCOBALAMIN 1000 MCG/ML IJ SOLN
1000.0000 ug | Freq: Once | INTRAMUSCULAR | Status: AC
  Administered 2017-08-14: 1000 ug via INTRAMUSCULAR

## 2017-08-14 NOTE — Progress Notes (Signed)
Rhonda Mathies R Lowne Chase, DO 

## 2017-08-14 NOTE — Progress Notes (Signed)
Pre visit review using our clinic tool,if applicable. No additional management support is needed unless otherwise documented below in the visit note.   Patient in for B12 injection per order from Dr. Carollee Herter due to patient having B12 deficiency.  Given 1000 mcg IM right deltoid. Patient tolerated well. Return appointment given for 1 month.

## 2017-08-17 ENCOUNTER — Other Ambulatory Visit: Payer: Self-pay | Admitting: Medical

## 2017-08-17 NOTE — Telephone Encounter (Signed)
TP-Here is a refill req/looks like was last sent for #1 with 5 additional/is requesting 13 to be Rx'ed/thx dmf

## 2017-08-20 ENCOUNTER — Ambulatory Visit (INDEPENDENT_AMBULATORY_CARE_PROVIDER_SITE_OTHER): Payer: Federal, State, Local not specified - PPO | Admitting: Family Medicine

## 2017-08-20 ENCOUNTER — Encounter (INDEPENDENT_AMBULATORY_CARE_PROVIDER_SITE_OTHER): Payer: Self-pay

## 2017-08-22 ENCOUNTER — Other Ambulatory Visit: Payer: Self-pay | Admitting: Family Medicine

## 2017-08-22 DIAGNOSIS — IMO0002 Reserved for concepts with insufficient information to code with codable children: Secondary | ICD-10-CM

## 2017-08-22 DIAGNOSIS — E1151 Type 2 diabetes mellitus with diabetic peripheral angiopathy without gangrene: Secondary | ICD-10-CM

## 2017-08-22 DIAGNOSIS — E1165 Type 2 diabetes mellitus with hyperglycemia: Principal | ICD-10-CM

## 2017-09-11 ENCOUNTER — Ambulatory Visit (INDEPENDENT_AMBULATORY_CARE_PROVIDER_SITE_OTHER): Payer: Federal, State, Local not specified - PPO | Admitting: Orthopaedic Surgery

## 2017-09-11 ENCOUNTER — Ambulatory Visit (INDEPENDENT_AMBULATORY_CARE_PROVIDER_SITE_OTHER): Payer: Federal, State, Local not specified - PPO

## 2017-09-11 ENCOUNTER — Ambulatory Visit (INDEPENDENT_AMBULATORY_CARE_PROVIDER_SITE_OTHER): Payer: Federal, State, Local not specified - PPO | Admitting: Behavioral Health

## 2017-09-11 ENCOUNTER — Encounter (INDEPENDENT_AMBULATORY_CARE_PROVIDER_SITE_OTHER): Payer: Self-pay | Admitting: Orthopaedic Surgery

## 2017-09-11 DIAGNOSIS — E538 Deficiency of other specified B group vitamins: Secondary | ICD-10-CM

## 2017-09-11 DIAGNOSIS — M1612 Unilateral primary osteoarthritis, left hip: Secondary | ICD-10-CM | POA: Diagnosis not present

## 2017-09-11 DIAGNOSIS — Z23 Encounter for immunization: Secondary | ICD-10-CM

## 2017-09-11 MED ORDER — CYANOCOBALAMIN 1000 MCG/ML IJ SOLN
1000.0000 ug | Freq: Once | INTRAMUSCULAR | Status: AC
  Administered 2017-09-11: 1000 ug via INTRAMUSCULAR

## 2017-09-11 NOTE — Progress Notes (Signed)
Office Visit Note   Patient: Rhonda Morrow           Date of Birth: [birth year]           MRN: [id number] Visit Date: 09/11/2017              Requested by: [address] [place], [place] [address] [place], [place] [address] PCP: Carollee Herter, Alferd Apa, DO   Assessment & Plan: Visit Diagnoses:  1. Primary osteoarthritis of left hip     Plan: Patient is doing well from her hip replacement.  Like to see her back in 5 months for her one-year anniversary.  Work note provided today.  Follow-up with AP pelvis and lateral left hip. Total face to face encounter time was greater than 25 minutes and over half of this time was spent in counseling and/or coordination of care.  Follow-Up Instructions: Return in about 5 months (around 02/09/2018).   Orders:  Orders Placed This Encounter  Procedures  . XR Pelvis 1-2 Views   No orders of the defined types were placed in this encounter.     Procedures: No procedures performed   Clinical Data: No additional findings.   Subjective: Chief Complaint  Patient presents with  . Left Hip - Pain, Follow-up  . Right Knee - Pain, Follow-up    Rhonda Morrow is 7 months status post left total hip replacement.  She presents today with her sign language interpreter.  Right knee is also doing much better.  She is back to work but still has some difficulty with heavy lifting and using a tractor.  Denies any numbness and tingling.    Review of Systems  Constitutional: Negative.   HENT: Negative.   Eyes: Negative.   Respiratory: Negative.   Cardiovascular: Negative.   Endocrine: Negative.   Musculoskeletal: Negative.   Neurological: Negative.   Hematological: Negative.   Psychiatric/Behavioral: Negative.   All other systems reviewed and are negative.    Objective: Vital Signs: LMP 05/20/2012   Physical Exam  Constitutional: She is oriented to person, place, and time. She appears well-developed and well-nourished.  HENT:  Head:  Normocephalic and atraumatic.  Eyes: EOM are normal.  Neck: Neck supple.  Pulmonary/Chest: Effort normal.  Abdominal: Soft.  Neurological: She is alert and oriented to person, place, and time.  Skin: Skin is warm. Capillary refill takes less than 2 seconds.  Psychiatric: She has a normal mood and affect. Her behavior is normal. Judgment and thought content normal.  Nursing note and vitals reviewed.   Ortho Exam Leg lengths are equal.  Surgical scar is fully healed.  Exam of both legs are benign. Specialty Comments:  No specialty comments available.  Imaging: Xr Pelvis 1-2 Views  Result Date: 09/11/2017 Stable left total hip replacement in good alignment    PMFS History: Patient Active Problem List   Diagnosis Date Noted  . Primary osteoarthritis of left hip 02/21/2017  . 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 History of laparoscopic cholecystectomy 06/20/2016  . Cerumen impaction 06/09/2016  . Hypersomnia 05/16/2016  . Knee pain, right 05/05/2016  . RUQ pain 04/09/2016  . Abnormal CT scan 03/28/2016  . Dyspnea and respiratory abnormalities 03/15/2016  . Asthma with acute exacerbation 03/15/2016  . Asthma in adult 02/25/2016  . DM (diabetes mellitus) type II controlled, neurological manifestation ([place]) 02/09/2016  . Left hip pain 12/23/2015  . Right hamstring muscle strain 11/18/2015  . Abdominal pain, acute 09/01/2015  .  Acute asthma exacerbation 04/16/2015  . Acute bronchitis 04/14/2015  . Vaginal discharge 09/14/2014  . Pap smear for cervical cancer screening 09/14/2014  . Scabies 05/06/2014  . Bed bug bite 05/06/2014  . Allergic rhinitis 04/09/2014  . Rectal itching 04/09/2014  . Bladder spasm 04/09/2014  . Knee pain, left 03/05/2014  . Hypokalemia 02/19/2014  . Nausea with vomiting 02/19/2014  . Diabetes mellitus, type II ([place]) 02/19/2014  . Edema 02/16/2014  . Disorder of rotator cuff 11/12/2013  . Routine general medical examination at a  health care facility 08/20/2013  . GERD (gastroesophageal reflux disease) 06/26/2013  . Dysphagia, pharyngoesophageal phase 06/26/2013  . Suprapubic pain 05/12/2013  . Medication side effect 03/25/2013  . Diarrhea 03/25/2013  . Benign positional vertigo 01/30/2013  . Traumatic hematoma of thigh 01/15/2013  . HTN (hypertension) 09/02/2012  . Dry skin 08/22/2012  . External hemorrhoids 08/22/2012  . Obesity 06/30/2012  . Thyromegaly 05/22/2012  . Back pain 05/22/2012  . Elevated glucose 05/22/2012  . Moderate persistent asthma 04/19/2012  . Dyspnea 01/28/2012   Past Medical History:  Diagnosis Date  . Arthritis   . Asthma   . Complication of anesthesia    one time woke up and was vey scared,17 yrs ago  . Deaf   . Diabetes mellitus without complication ([place])   . GERD (gastroesophageal reflux disease)   . Hypertension   . Left groin pain   . Thyroid disease     Family History  Problem Relation Age of Onset  . Diabetes Mother   . Heart disease Mother   . Diabetes Father     Past Surgical History:  Procedure Laterality Date  . CARDIAC CATHETERIZATION    . CESAREAN SECTION     Social History   Occupational History  . Occupation: disabled  Tobacco Use  . Smoking status: Never Smoker  . Smokeless tobacco: Never Used  Substance and Sexual Activity  . Alcohol use: No    Alcohol/week: 0.0 oz  . Drug use: No  . Sexual activity: No

## 2017-09-11 NOTE — Progress Notes (Signed)
Pre visit review using our clinic review tool, if applicable. No additional management support is needed unless otherwise documented below in the visit note.  Patient came in clinic today for B12 injection & influenza vaccination. Both injections were tolerated well. Next appointment scheduled for 10/09/17 at 10:45 AM.

## 2017-09-20 ENCOUNTER — Other Ambulatory Visit: Payer: Self-pay | Admitting: Family Medicine

## 2017-09-20 DIAGNOSIS — I10 Essential (primary) hypertension: Secondary | ICD-10-CM

## 2017-09-20 DIAGNOSIS — Z889 Allergy status to unspecified drugs, medicaments and biological substances status: Secondary | ICD-10-CM

## 2017-10-02 ENCOUNTER — Other Ambulatory Visit: Payer: Self-pay | Admitting: Family Medicine

## 2017-10-02 DIAGNOSIS — Z96642 Presence of left artificial hip joint: Secondary | ICD-10-CM

## 2017-10-02 DIAGNOSIS — M25561 Pain in right knee: Secondary | ICD-10-CM

## 2017-10-08 ENCOUNTER — Other Ambulatory Visit: Payer: Self-pay | Admitting: Family Medicine

## 2017-10-09 ENCOUNTER — Ambulatory Visit (INDEPENDENT_AMBULATORY_CARE_PROVIDER_SITE_OTHER): Payer: Federal, State, Local not specified - PPO

## 2017-10-09 DIAGNOSIS — E538 Deficiency of other specified B group vitamins: Secondary | ICD-10-CM | POA: Diagnosis not present

## 2017-10-09 MED ORDER — CYANOCOBALAMIN 1000 MCG/ML IJ SOLN
1000.0000 ug | Freq: Once | INTRAMUSCULAR | Status: AC
  Administered 2017-10-09: 1000 ug via INTRAMUSCULAR

## 2017-10-09 NOTE — Progress Notes (Signed)
Pre visit review using our clinic tool,if applicable. No additional management support is needed unless otherwise documented below in the visit note.   Patient in for B! Injection per order from Dr. Carollee Herter due to patient having B12 deiciency.  Given 1000 mcg IM left deltoid. Patient tolerated well  No complaints voiced this visit.  Next appointment scheduled for November 13, 2017. Patient can not come week before.

## 2017-10-29 ENCOUNTER — Telehealth: Payer: Self-pay | Admitting: *Deleted

## 2017-10-29 NOTE — Telephone Encounter (Signed)
Received Physician Orders from [facility] for knee-high Compression Socks; forwarded to provider/SLS 12/24

## 2017-11-13 ENCOUNTER — Ambulatory Visit (INDEPENDENT_AMBULATORY_CARE_PROVIDER_SITE_OTHER): Payer: Federal, State, Local not specified - PPO

## 2017-11-13 DIAGNOSIS — E538 Deficiency of other specified B group vitamins: Secondary | ICD-10-CM

## 2017-11-13 MED ORDER — CYANOCOBALAMIN 1000 MCG/ML IJ SOLN
1000.0000 ug | Freq: Once | INTRAMUSCULAR | Status: AC
Start: 2017-11-13 — End: 2017-11-13
  Administered 2017-11-13: 1000 ug via INTRAMUSCULAR

## 2017-11-13 NOTE — Progress Notes (Signed)
Noted  Yvonne R Lowne Chase, DO  

## 2017-11-13 NOTE — Progress Notes (Signed)
Pre visit review using our clinic tool,if applicable. No additional management support is needed unless otherwise documented below in the visit note.   Patient in for B12 injection per order from Dr. Lawson Radar due to patient having b12 deficiency.  Given 1000 mcg IM left deltoid. Patient tolerated well.  Return appointment scheduled for next month.

## 2017-11-15 ENCOUNTER — Encounter: Payer: Self-pay | Admitting: Family Medicine

## 2017-11-15 ENCOUNTER — Ambulatory Visit (INDEPENDENT_AMBULATORY_CARE_PROVIDER_SITE_OTHER): Payer: Federal, State, Local not specified - PPO | Admitting: Family Medicine

## 2017-11-15 VITALS — BP 135/60 | HR 76 | Temp 97.8°F | Resp 16 | Ht 64.0 in | Wt 218.4 lb

## 2017-11-15 DIAGNOSIS — R1084 Generalized abdominal pain: Secondary | ICD-10-CM | POA: Diagnosis not present

## 2017-11-15 DIAGNOSIS — R1033 Periumbilical pain: Secondary | ICD-10-CM

## 2017-11-15 DIAGNOSIS — Z889 Allergy status to unspecified drugs, medicaments and biological substances status: Secondary | ICD-10-CM

## 2017-11-15 DIAGNOSIS — R112 Nausea with vomiting, unspecified: Secondary | ICD-10-CM | POA: Diagnosis not present

## 2017-11-15 LAB — CBC WITH DIFFERENTIAL/PLATELET
BASOS PCT: 0.7 %
Basophils Absolute: 48 cells/uL (ref 0–200)
EOS ABS: 97 {cells}/uL (ref 15–500)
Eosinophils Relative: 1.4 %
HCT: 40.6 % (ref 35.0–45.0)
HEMOGLOBIN: 13.3 g/dL (ref 11.7–15.5)
Lymphs Abs: 1994 cells/uL (ref 850–3900)
MCH: 25.8 pg — AB (ref 27.0–33.0)
MCHC: 32.8 g/dL (ref 32.0–36.0)
MCV: 78.8 fL — AB (ref 80.0–100.0)
MPV: 12 fL (ref 7.5–12.5)
Monocytes Relative: 10.1 %
NEUTROS ABS: 4064 {cells}/uL (ref 1500–7800)
Neutrophils Relative %: 58.9 %
Platelets: 251 10*3/uL (ref 140–400)
RBC: 5.15 10*6/uL — AB (ref 3.80–5.10)
RDW: 13.6 % (ref 11.0–15.0)
Total Lymphocyte: 28.9 %
WBC: 6.9 10*3/uL (ref 3.8–10.8)
WBCMIX: 697 {cells}/uL (ref 200–950)

## 2017-11-15 LAB — COMPREHENSIVE METABOLIC PANEL
AG RATIO: 1.3 (calc) (ref 1.0–2.5)
ALT: 15 U/L (ref 6–29)
AST: 17 U/L (ref 10–35)
Albumin: 3.9 g/dL (ref 3.6–5.1)
Alkaline phosphatase (APISO): 76 U/L (ref 33–130)
BILIRUBIN TOTAL: 0.3 mg/dL (ref 0.2–1.2)
BUN: 14 mg/dL (ref 7–25)
CO2: 31 mmol/L (ref 20–32)
Calcium: 9.3 mg/dL (ref 8.6–10.4)
Chloride: 105 mmol/L (ref 98–110)
Creat: 0.97 mg/dL (ref 0.50–1.05)
GLUCOSE: 96 mg/dL (ref 65–99)
Globulin: 2.9 g/dL (calc) (ref 1.9–3.7)
Potassium: 3.9 mmol/L (ref 3.5–5.3)
Sodium: 142 mmol/L (ref 135–146)
Total Protein: 6.8 g/dL (ref 6.1–8.1)

## 2017-11-15 MED ORDER — CETIRIZINE HCL 10 MG PO TABS: 10.0000 mg | ORAL_TABLET | Freq: Every day | ORAL | 5 refills | Status: DC

## 2017-11-15 NOTE — Progress Notes (Signed)
Patient ID: Shareece Bultman, female    DOB: December 19, 1960  Age: 57 y.o. MRN: [id number]    Subjective:  Subjective  HPI Sundus Pete presents for loose stools ,  abd pain and ? Fever- she felt hot -- she did not take her temp.  + nausea--- loss of appetite.  She did not want to eat / drink anything yesterday / today.    Review of Systems  Constitutional: Negative for appetite change, diaphoresis, fatigue and unexpected weight change.  Eyes: Negative for pain, redness and visual disturbance.  Respiratory: Negative for cough, chest tightness, shortness of breath and wheezing.   Cardiovascular: Negative for chest pain, palpitations and leg swelling.  Gastrointestinal: Positive for abdominal pain, diarrhea and nausea. Negative for vomiting.  Endocrine: Negative for cold intolerance, heat intolerance, polydipsia, polyphagia and polyuria.  Genitourinary: Negative for difficulty urinating, dysuria and frequency.  Neurological: Negative for dizziness, light-headedness, numbness and headaches.    History Past Medical History:  Diagnosis Date  . Arthritis   . Asthma   . Complication of anesthesia    one time woke up and was vey scared,17 yrs ago  . Deaf   . Diabetes mellitus without complication ([place])   . GERD (gastroesophageal reflux disease)   . Hypertension   . Left groin pain   . Thyroid disease     She has a past surgical history that includes Cesarean section; Cardiac catheterization; Esophageal manometry (N/A, 09/29/2013); Cholecystectomy (N/A, 06/20/2016); and Total hip arthroplasty (Left, 02/21/2017).   Her family history includes Diabetes in her father and mother; Heart disease in her mother.She reports that  has never smoked. she has never used smokeless tobacco. She reports that she does not drink alcohol or use drugs.  Current Outpatient Medications on File Prior to Visit  Medication Sig Dispense Refill  . albuterol (PROVENTIL HFA;VENTOLIN HFA) 108 (90 Base) MCG/ACT inhaler Inhale 2  puffs into the lungs every 6 (six) hours as needed for wheezing. 1 Inhaler 5  . amLODipine (NORVASC) 5 MG tablet TAKE 1 TABLET (5 MG TOTAL) BY MOUTH DAILY. 90 tablet 1  . aspirin EC 325 MG tablet Take 1 tablet (325 mg total) by mouth 2 (two) times daily. 84 tablet 0  . azelastine (ASTELIN) 0.1 % nasal spray Place 2 sprays into both nostrils 2 (two) times daily. Use in each nostril as directed 30 mL 3  . CVS ASPIRIN 325 MG tablet TAKE 1 TABLET BY MOUTH TWICE A DAY 100 tablet 0  . Fiber CHEW Chew 1 tablet by mouth daily.    . fluticasone (FLONASE) 50 MCG/ACT nasal spray PLACE 2 SPRAYS INTO BOTH NOSTRILS DAILY. 16 g 4  . glucose blood test strip Use as instructed--- one touch verio test strips 100 each 12  . hydrochlorothiazide (HYDRODIURIL) 12.5 MG tablet Take 1 tablet (12.5 mg total) by mouth daily. 90 tablet 1  . ipratropium-albuterol (DUONEB) 0.5-2.5 (3) MG/3ML SOLN Take 3 mLs by nebulization every 6 (six) hours as needed. 360 mL 3  . KLOR-CON M20 20 MEQ tablet TAKE 1 TABLET BY MOUTH EVERY DAY 90 tablet 1  . meclizine (ANTIVERT) 25 MG tablet Take 1 tablet (25 mg total) by mouth 3 (three) times daily as needed. 45 tablet 3  . meloxicam (MOBIC) 7.5 MG tablet Take 2 tablets (15 mg total) by mouth daily as needed for pain. 30 tablet 2  . metaxalone (SKELAXIN) 800 MG tablet TAKE 1 TABLET BY MOUTH THREE TIMES A DAY 30 tablet 2  . metFORMIN (GLUCOPHAGE-XR)  500 MG 24 hr tablet TAKE 1 TABLET BY MOUTH EVERY DAY WITH BREAKFAST 90 tablet 1  . mometasone-formoterol (DULERA) 100-5 MCG/ACT AERO Inhale 2 puffs into the lungs 2 (two) times daily. 1 Inhaler 5  . montelukast (SINGULAIR) 10 MG tablet Take 1 tablet (10 mg total) by mouth at bedtime. 30 tablet 5  . Multiple Vitamin (MULTIVITAMIN) tablet Take 1 tablet by mouth daily.    . NONFORMULARY OR COMPOUNDED ITEM Nebulizer   DX ASTHMA 1 each 0  . omeprazole (PRILOSEC) 20 MG capsule Take 1 capsule (20 mg total) by mouth daily. 30 capsule 2  . ondansetron  (ZOFRAN) 4 MG tablet Take 1 tablet (4 mg total) by mouth every 8 (eight) hours as needed for nausea or vomiting. 10 tablet 0  . promethazine (PHENERGAN) 25 MG tablet Take 1 tablet (25 mg total) by mouth every 6 (six) hours as needed for nausea. 30 tablet 1  . ranitidine (ZANTAC) 150 MG capsule TAKE ONE CAPSULE BY MOUTH TWICE A DAY 180 capsule 1  . senna-docusate (SENOKOT S) 8.6-50 MG tablet Take 1 tablet by mouth at bedtime as needed. 30 tablet 1  . Spacer/Aero-Holding Chambers (AEROCHAMBER MV) inhaler Use as instructed 1 each 0  . traMADol (ULTRAM) 50 MG tablet Take 1 tablet (50 mg total) by mouth at bedtime as needed. 30 tablet 0  . zolpidem (AMBIEN) 10 MG tablet TAKE 1 TABLET BY MOUTH AT BEDTIME AS NEEDED FOR SLEEP 15 tablet 1   Current Facility-Administered Medications on File Prior to Visit  Medication Dose Route Frequency Provider Last Rate Last Dose  . cyanocobalamin ((VITAMIN B-12)) injection 1,000 mcg  1,000 mcg Intramuscular Once Carollee Herter, Kendrick Fries R, DO         Objective:  Objective  Physical Exam  Constitutional: She is oriented to person, place, and time. She appears well-developed and well-nourished.  HENT:  Head: Normocephalic and atraumatic.  Eyes: Conjunctivae and EOM are normal.  Neck: Normal range of motion. Neck supple. No JVD present. Carotid bruit is not present. No thyromegaly present.  Cardiovascular: Normal rate, regular rhythm and normal heart sounds.  No murmur heard. Pulmonary/Chest: Effort normal and breath sounds normal. No respiratory distress. She has no wheezes. She has no rales. She exhibits no tenderness.  Abdominal: Soft. Bowel sounds are normal. She exhibits no distension and no mass. There is tenderness. There is guarding. There is no rebound.    Musculoskeletal: She exhibits no edema.  Neurological: She is alert and oriented to person, place, and time.  Psychiatric: She has a normal mood and affect.  Nursing note and vitals reviewed.  BP 135/60  (BP Location: Left Arm, Patient Position: Sitting, Cuff Size: Normal)   Pulse 76   Temp 97.8 F (36.6 C) (Oral)   Resp 16   Ht 5\' 4"  (1.626 m)   Wt 218 lb 6.4 oz (99.1 kg)   LMP 05/20/2012   SpO2 100%   BMI 37.49 kg/m  Wt Readings from Last 3 Encounters:  11/15/17 218 lb 6.4 oz (99.1 kg)  06/25/17 206 lb 3.2 oz (93.5 kg)  06/04/17 206 lb (93.4 kg)     Lab Results  Component Value Date   WBC 9.2 02/22/2017   HGB 11.4 (L) 02/22/2017   HCT 35.9 (L) 02/22/2017   PLT 208 02/22/2017   GLUCOSE 107 (H) 02/22/2017   CHOL 175 12/04/2016   TRIG 80.0 12/04/2016   HDL 68.10 12/04/2016   LDLCALC 91 12/04/2016   ALT 24 02/12/2017  AST 25 02/12/2017   NA 137 02/22/2017   K 3.9 02/22/2017   CL 103 02/22/2017   CREATININE 1.30 (H) 02/22/2017   BUN 23 (H) 02/22/2017   CO2 25 02/22/2017   TSH 0.87 12/04/2016   INR 1.05 02/12/2017   HGBA1C 6.3 (H) 02/12/2017   MICROALBUR 0.8 10/27/2015    No results found.   Assessment & Plan:  Plan  I am having [name] maintain her meclizine, multivitamin, Fiber, ipratropium-albuterol, NONFORMULARY OR COMPOUNDED ITEM, AEROCHAMBER MV, omeprazole, azelastine, fluticasone, glucose blood, ondansetron, aspirin EC, promethazine, senna-docusate, CVS ASPIRIN, traMADol, meloxicam, ranitidine, albuterol, mometasone-formoterol, montelukast, zolpidem, hydrochlorothiazide, metFORMIN, amLODipine, metaxalone, KLOR-CON M20, and cetirizine. We will continue to administer cyanocobalamin.  Meds ordered this encounter  Medications  . cetirizine (ZYRTEC) 10 MG tablet    Sig: Take 1 tablet (10 mg total) by mouth daily.    Dispense:  30 tablet    Refill:  5    Problem List Items Addressed This Visit      Unprioritized   Nausea with vomiting   Relevant Orders   CT Abdomen Pelvis W Contrast    Other Visit Diagnoses    Periumbilical abdominal pain    -  Primary   Relevant Orders   CT Abdomen Pelvis W Contrast   CBC with Differential/Platelet    Comprehensive metabolic panel   Multiple allergies       Relevant Medications   cetirizine (ZYRTEC) 10 MG tablet   Generalized abdominal pain       Relevant Orders   CT Abdomen Pelvis W Contrast    if pain worsens -- go to ER  Follow-up: Return if symptoms worsen or fail to improve, for lipid, hep bmp hgba1c, ua, microalbumin.  Ann Held, DO

## 2017-11-15 NOTE — Patient Instructions (Signed)

## 2017-11-16 ENCOUNTER — Encounter (HOSPITAL_BASED_OUTPATIENT_CLINIC_OR_DEPARTMENT_OTHER): Payer: Self-pay

## 2017-11-16 ENCOUNTER — Ambulatory Visit (HOSPITAL_BASED_OUTPATIENT_CLINIC_OR_DEPARTMENT_OTHER)
Admission: RE | Admit: 2017-11-16 | Discharge: 2017-11-16 | Disposition: A | Discharge status: Home / Self Care | Payer: Federal, State, Local not specified - PPO | Source: Ambulatory Visit | Attending: Family Medicine | Admitting: Family Medicine

## 2017-11-16 DIAGNOSIS — R1033 Periumbilical pain: Secondary | ICD-10-CM | POA: Diagnosis not present

## 2017-11-16 DIAGNOSIS — R197 Diarrhea, unspecified: Secondary | ICD-10-CM | POA: Diagnosis not present

## 2017-11-16 DIAGNOSIS — R1084 Generalized abdominal pain: Secondary | ICD-10-CM | POA: Diagnosis not present

## 2017-11-16 DIAGNOSIS — R109 Unspecified abdominal pain: Secondary | ICD-10-CM | POA: Diagnosis not present

## 2017-11-16 DIAGNOSIS — R112 Nausea with vomiting, unspecified: Secondary | ICD-10-CM | POA: Diagnosis not present

## 2017-11-16 MED ORDER — IOPAMIDOL (ISOVUE-300) INJECTION 61%
100.0000 mL | Freq: Once | INTRAVENOUS | Status: AC | PRN
  Administered 2017-11-16: 100 mL via INTRAVENOUS

## 2017-11-19 ENCOUNTER — Ambulatory Visit (INDEPENDENT_AMBULATORY_CARE_PROVIDER_SITE_OTHER): Payer: Federal, State, Local not specified - PPO | Admitting: Orthopaedic Surgery

## 2017-11-20 ENCOUNTER — Telehealth: Payer: Self-pay | Admitting: *Deleted

## 2017-11-20 NOTE — Telephone Encounter (Signed)
Copied from [id number]. Topic: Quick Communication - Lab Results >> Nov 19, 2017  4:53 PM Corie Chiquito, [place] wrote: Patient called with interpreter to check the status of lab work and the CT Scan she had done in the office on 11-15-2017. Patient also haves questions about what the olab work was for. If someone could give her a call back at [phone]

## 2017-11-20 NOTE — Telephone Encounter (Signed)
Left message on machine for patient to call back for ct results.    Ct was normal

## 2017-11-22 NOTE — Telephone Encounter (Signed)
Called Pt and interpretor service picked up the call. Pt was not home, however her husband was. I informed Ilona Sorrel that Pt's CT scan came back normal and other labs results had been viewed via MyChart. At this time no other information to relay at this time. Pt's husband seemed pleased and call was disconnected.

## 2017-12-11 ENCOUNTER — Ambulatory Visit (INDEPENDENT_AMBULATORY_CARE_PROVIDER_SITE_OTHER): Payer: Federal, State, Local not specified - PPO

## 2017-12-11 DIAGNOSIS — E538 Deficiency of other specified B group vitamins: Secondary | ICD-10-CM

## 2017-12-11 MED ORDER — CYANOCOBALAMIN 1000 MCG/ML IJ SOLN
1000.0000 ug | Freq: Once | INTRAMUSCULAR | Status: AC
  Administered 2017-12-11: 1000 ug via INTRAMUSCULAR

## 2017-12-11 NOTE — Progress Notes (Signed)
Pt came in today for B12 injection. Given in R arm at 1100, advised to follow up in 4 weeks. Appointment set at front desk and reminder was printed for Pt.

## 2017-12-19 ENCOUNTER — Encounter: Payer: Self-pay | Admitting: *Deleted

## 2017-12-19 ENCOUNTER — Ambulatory Visit (INDEPENDENT_AMBULATORY_CARE_PROVIDER_SITE_OTHER): Payer: Federal, State, Local not specified - PPO | Admitting: Medical

## 2017-12-19 ENCOUNTER — Encounter: Payer: Self-pay | Admitting: Medical

## 2017-12-19 VITALS — BP 124/58 | HR 76 | Temp 97.6°F | Resp 16 | Ht 64.0 in | Wt 218.8 lb

## 2017-12-19 DIAGNOSIS — E119 Type 2 diabetes mellitus without complications: Secondary | ICD-10-CM

## 2017-12-19 DIAGNOSIS — R42 Dizziness and giddiness: Secondary | ICD-10-CM

## 2017-12-19 DIAGNOSIS — I10 Essential (primary) hypertension: Secondary | ICD-10-CM | POA: Diagnosis not present

## 2017-12-19 DIAGNOSIS — H6122 Impacted cerumen, left ear: Secondary | ICD-10-CM | POA: Diagnosis not present

## 2017-12-19 LAB — CBC WITH DIFFERENTIAL/PLATELET
BASOS ABS: 0 10*3/uL (ref 0.0–0.1)
Basophils Relative: 0.6 % (ref 0.0–3.0)
Eosinophils Absolute: 0.1 10*3/uL (ref 0.0–0.7)
Eosinophils Relative: 2.1 % (ref 0.0–5.0)
HCT: 42.7 % (ref 36.0–46.0)
Hemoglobin: 14 g/dL (ref 12.0–15.0)
LYMPHS ABS: 1.9 10*3/uL (ref 0.7–4.0)
Lymphocytes Relative: 29.5 % (ref 12.0–46.0)
MCHC: 32.7 g/dL (ref 30.0–36.0)
MCV: 81.8 fl (ref 78.0–100.0)
MONO ABS: 0.6 10*3/uL (ref 0.1–1.0)
Monocytes Relative: 9.2 % (ref 3.0–12.0)
NEUTROS ABS: 3.9 10*3/uL (ref 1.4–7.7)
NEUTROS PCT: 58.6 % (ref 43.0–77.0)
PLATELETS: 224 10*3/uL (ref 150.0–400.0)
RBC: 5.21 Mil/uL — ABNORMAL HIGH (ref 3.87–5.11)
RDW: 14.5 % (ref 11.5–15.5)
WBC: 6.6 10*3/uL (ref 4.0–10.5)

## 2017-12-19 LAB — COMPREHENSIVE METABOLIC PANEL
ALT: 18 U/L (ref 0–35)
AST: 19 U/L (ref 0–37)
Albumin: 3.8 g/dL (ref 3.5–5.2)
Alkaline Phosphatase: 72 U/L (ref 39–117)
BILIRUBIN TOTAL: 0.5 mg/dL (ref 0.2–1.2)
BUN: 14 mg/dL (ref 6–23)
CO2: 32 meq/L (ref 19–32)
Calcium: 9.5 mg/dL (ref 8.4–10.5)
Chloride: 104 mEq/L (ref 96–112)
Creatinine, Ser: 0.93 mg/dL (ref 0.40–1.20)
GFR: 80.71 mL/min (ref >60.00)
GLUCOSE: 106 mg/dL — AB (ref 70–99)
Potassium: 3.7 mEq/L (ref 3.5–5.1)
Sodium: 140 mEq/L (ref 135–145)
Total Protein: 7.2 g/dL (ref 6.0–8.3)

## 2017-12-19 LAB — HEMOGLOBIN A1C: Hgb A1c MFr Bld: 6.4 % (ref 4.6–6.5)

## 2017-12-19 MED ORDER — MECLIZINE HCL 12.5 MG PO TABS: 12.5000 mg | ORAL_TABLET | Freq: Three times a day (TID) | ORAL | 0 refills | Status: DC | PRN

## 2017-12-19 MED ORDER — GLUCOSE BLOOD VI STRP: ORAL_STRIP | 12 refills | Status: DC

## 2017-12-19 MED ORDER — ONETOUCH ULTRASOFT LANCETS MISC: 12 refills | Status: DC

## 2017-12-19 NOTE — Telephone Encounter (Signed)
This encounter was created in error - please disregard.

## 2017-12-19 NOTE — Patient Instructions (Addendum)
For your intermittent episodes of dizziness, we did a neurologic exam today and that exam looks very good.  He did have a little bit of dizziness/vertigo on rapid changing position from supine to sitting and when you turn your head rapidly to the right.  We will get blood work today to assess electrolytes, blood volume/if anemic, and I want you to start checking your sugar and your blood pressure when you feel dizzy.  If you notice blood sugar levels less than 70 then you would need to eat something to bring your blood sugar back up.  Low levels less than 70 can cause dizziness and other symptoms.  His sugar levels are consistently above 200 then we might need to get more aggressive on diabetic management.  Also when checking blood pressure levels higher than 140/90 might cause dizziness.  Also getting A1c today.  If you were to get constant dizziness with other associated neurologic type symptoms then recommend ED evaluation.  I am going to write meclizine that you can use if you have dizziness that lasts more than 5 minutes.  I am going to print Epley maneuver exercises that I want you to try at home.  Also try to schedule you with PT for exercise maneuvers as well.  You do have some very hard appearing wax in the left ear canal.  Would recommend Debrox over-the-counter use for 3-4 days then schedule appointment for lavage of your ear.    Follow-up next Tuesday or as needed  How to Perform the Epley Maneuver The Epley maneuver is an exercise that relieves symptoms of vertigo. Vertigo is the feeling that you or your surroundings are moving when they are not. When you feel vertigo, you may feel like the room is spinning and have trouble walking. Dizziness is a little different than vertigo. When you are dizzy, you may feel unsteady or light-headed. You can do this maneuver at home whenever you have symptoms of vertigo. You can do it up to 3 times a day until your symptoms go away. Even though the  Epley maneuver may relieve your vertigo for a few weeks, it is possible that your symptoms will return. This maneuver relieves vertigo, but it does not relieve dizziness. What are the risks? If it is done correctly, the Epley maneuver is considered safe. Sometimes it can lead to dizziness or nausea that goes away after a short time. If you develop other symptoms, such as changes in vision, weakness, or numbness, stop doing the maneuver and call your health care provider. How to perform the Epley maneuver 1. Sit on the edge of a bed or table with your back straight and your legs extended or hanging over the edge of the bed or table. 2. Turn your head halfway toward the affected ear or side. 3. Lie backward quickly with your head turned until you are lying flat on your back. You may want to position a pillow under your shoulders. 4. Hold this position for 30 seconds. You may experience an attack of vertigo. This is normal. 5. Turn your head to the opposite direction until your unaffected ear is facing the floor. 6. Hold this position for 30 seconds. You may experience an attack of vertigo. This is normal. Hold this position until the vertigo stops. 7. Turn your whole body to the same side as your head. Hold for another 30 seconds. 8. Sit back up. You can repeat this exercise up to 3 times a day. Follow these instructions at  home:  After doing the Epley maneuver, you can return to your normal activities.  Ask your health care provider if there is anything you should do at home to prevent vertigo. He or she may recommend that you: ? Keep your head raised (elevated) with two or more pillows while you sleep. ? Do not sleep on the side of your affected ear. ? Get up slowly from bed. ? Avoid sudden movements during the day. ? Avoid extreme head movement, like looking up or bending over. Contact a health care provider if:  Your vertigo gets worse.  You have other symptoms,  including: ? Nausea. ? Vomiting. ? Headache. Get help right away if:  You have vision changes.  You have a severe or worsening headache or neck pain.  You cannot stop vomiting.  You have new numbness or weakness in any part of your body. Summary  Vertigo is the feeling that you or your surroundings are moving when they are not.  The Epley maneuver is an exercise that relieves symptoms of vertigo.  If the Epley maneuver is done correctly, it is considered safe. You can do it up to 3 times a day. This information is not intended to replace advice given to you by your health care provider. Make sure you discuss any questions you have with your health care provider. Document Released: 10/28/2013 Document Revised: 09/12/2016 Document Reviewed: 09/12/2016 Elsevier Interactive Patient Education  2017 [employer].

## 2017-12-19 NOTE — Progress Notes (Signed)
Subjective:    Patient ID: Rhonda Morrow, female    DOB: Jan 07, 1961, 57 y.o.   MRN: [id number]  HPI  Pt in with some dizziness this morning and weak yesterday. She thought balance was off. She thought it might have been blood sugar but she could not check. She does not have lancets. She does not have any bp machine at home.  Pt worked out yesterday. She walked a lot. She felt little dizzy after exercise. She got home and rested. Dizziness subsided within 20 minutes.  Pt sugar in office was 103. She at breakfast at 8:40 am. Last April her a1-c was 6.3. She is on metformin 1 tablet a day.  On review no cardiac or neurologic signs or symptoms.    Review of Systems  Constitutional: Negative for chills, fatigue and fever.  HENT: Negative for congestion, drooling, ear pain and facial swelling.   Respiratory: Negative for cough, choking, shortness of breath and wheezing.   Cardiovascular: Negative for chest pain and palpitations.  Gastrointestinal: Negative for abdominal distention, abdominal pain, blood in stool and constipation.  Genitourinary: Negative for difficulty urinating and dysuria.  Musculoskeletal: Negative for back pain and joint swelling.  Skin: Negative for rash.  Neurological: Positive for dizziness. Negative for seizures, weakness and numbness.       Brief today in office when she looked down then resolved.  Then had some on turning head rapidly to rt when lying supine.  Hematological: Negative for adenopathy. Does not bruise/bleed easily.  Psychiatric/Behavioral: Negative for behavioral problems, confusion, dysphoric mood and hallucinations. The patient is not nervous/anxious.    Past Medical History:  Diagnosis Date  . Arthritis   . Asthma   . Complication of anesthesia    one time woke up and was vey scared,17 yrs ago  . Deaf   . Diabetes mellitus without complication ([place])   . GERD (gastroesophageal reflux disease)   . Hypertension   . Left groin pain   .  Thyroid disease      Social History   Socioeconomic History  . Marital status: Married    Spouse name: Not on file  . Number of children: Not on file  . Years of education: Not on file  . Highest education level: Not on file  Social Needs  . Financial resource strain: Not on file  . Food insecurity - worry: Not on file  . Food insecurity - inability: Not on file  . Transportation needs - medical: Not on file  . Transportation needs - non-medical: Not on file  Occupational History  . Occupation: disabled  Tobacco Use  . Smoking status: Never Smoker  . Smokeless tobacco: Never Used  Substance and Sexual Activity  . Alcohol use: No    Alcohol/week: 0.0 oz  . Drug use: No  . Sexual activity: No  Other Topics Concern  . Not on file  Social History Narrative   Lives with family.  Has 3 children.  Works for [employer].  Education: high school.    Past Surgical History:  Procedure Laterality Date  . CARDIAC CATHETERIZATION    . CESAREAN SECTION    . CHOLECYSTECTOMY N/A 06/20/2016   Procedure: LAPAROSCOPIC CHOLECYSTECTOMY;  Surgeon: Rolm Bookbinder, MD;  Location: [facility];  Service: General;  Laterality: N/A;  . ESOPHAGEAL MANOMETRY N/A 09/29/2013   Procedure: ESOPHAGEAL MANOMETRY (EM);  Surgeon: Milus Banister, MD;  Location: [facility];  Service: Endoscopy;  Laterality: N/A;  . TOTAL HIP ARTHROPLASTY Left 02/21/2017  Procedure: LEFT TOTAL HIP ARTHROPLASTY ANTERIOR APPROACH;  Surgeon: Leandrew Koyanagi, MD;  Location: [facility];  Service: Orthopedics;  Laterality: Left;    Family History  Problem Relation Age of Onset  . Diabetes Mother   . Heart disease Mother   . Diabetes Father     Allergies  Allergen Reactions  . Losartan Shortness Of Breath  . Oxycodone-Acetaminophen Nausea And Vomiting  . Augmentin [Amoxicillin-Pot Clavulanate] Diarrhea    Current Outpatient Medications on File Prior to Visit  Medication Sig Dispense Refill  . albuterol (PROVENTIL HFA;VENTOLIN HFA)  108 (90 Base) MCG/ACT inhaler Inhale 2 puffs into the lungs every 6 (six) hours as needed for wheezing. 1 Inhaler 5  . amLODipine (NORVASC) 5 MG tablet TAKE 1 TABLET (5 MG TOTAL) BY MOUTH DAILY. 90 tablet 1  . aspirin EC 325 MG tablet Take 1 tablet (325 mg total) by mouth 2 (two) times daily. 84 tablet 0  . azelastine (ASTELIN) 0.1 % nasal spray Place 2 sprays into both nostrils 2 (two) times daily. Use in each nostril as directed 30 mL 3  . cetirizine (ZYRTEC) 10 MG tablet Take 1 tablet (10 mg total) by mouth daily. 30 tablet 5  . CVS ASPIRIN 325 MG tablet TAKE 1 TABLET BY MOUTH TWICE A DAY 100 tablet 0  . Fiber CHEW Chew 1 tablet by mouth daily.    . fluticasone (FLONASE) 50 MCG/ACT nasal spray PLACE 2 SPRAYS INTO BOTH NOSTRILS DAILY. 16 g 4  . glucose blood test strip Use as instructed--- one touch verio test strips 100 each 12  . hydrochlorothiazide (HYDRODIURIL) 12.5 MG tablet Take 1 tablet (12.5 mg total) by mouth daily. 90 tablet 1  . ipratropium-albuterol (DUONEB) 0.5-2.5 (3) MG/3ML SOLN Take 3 mLs by nebulization every 6 (six) hours as needed. 360 mL 3  . KLOR-CON M20 20 MEQ tablet TAKE 1 TABLET BY MOUTH EVERY DAY 90 tablet 1  . meclizine (ANTIVERT) 25 MG tablet Take 1 tablet (25 mg total) by mouth 3 (three) times daily as needed. 45 tablet 3  . meloxicam (MOBIC) 7.5 MG tablet Take 2 tablets (15 mg total) by mouth daily as needed for pain. 30 tablet 2  . metaxalone (SKELAXIN) 800 MG tablet TAKE 1 TABLET BY MOUTH THREE TIMES A DAY 30 tablet 2  . metFORMIN (GLUCOPHAGE-XR) 500 MG 24 hr tablet TAKE 1 TABLET BY MOUTH EVERY DAY WITH BREAKFAST 90 tablet 1  . mometasone-formoterol (DULERA) 100-5 MCG/ACT AERO Inhale 2 puffs into the lungs 2 (two) times daily. 1 Inhaler 5  . montelukast (SINGULAIR) 10 MG tablet Take 1 tablet (10 mg total) by mouth at bedtime. 30 tablet 5  . Multiple Vitamin (MULTIVITAMIN) tablet Take 1 tablet by mouth daily.    . NONFORMULARY OR COMPOUNDED ITEM Nebulizer   DX  ASTHMA 1 each 0  . omeprazole (PRILOSEC) 20 MG capsule Take 1 capsule (20 mg total) by mouth daily. 30 capsule 2  . ondansetron (ZOFRAN) 4 MG tablet Take 1 tablet (4 mg total) by mouth every 8 (eight) hours as needed for nausea or vomiting. 10 tablet 0  . promethazine (PHENERGAN) 25 MG tablet Take 1 tablet (25 mg total) by mouth every 6 (six) hours as needed for nausea. 30 tablet 1  . ranitidine (ZANTAC) 150 MG capsule TAKE ONE CAPSULE BY MOUTH TWICE A DAY 180 capsule 1  . senna-docusate (SENOKOT S) 8.6-50 MG tablet Take 1 tablet by mouth at bedtime as needed. 30 tablet 1  . Spacer/Aero-Holding  Chambers (AEROCHAMBER MV) inhaler Use as instructed 1 each 0  . traMADol (ULTRAM) 50 MG tablet Take 1 tablet (50 mg total) by mouth at bedtime as needed. 30 tablet 0  . zolpidem (AMBIEN) 10 MG tablet TAKE 1 TABLET BY MOUTH AT BEDTIME AS NEEDED FOR SLEEP 15 tablet 1   No current facility-administered medications on file prior to visit.     BP (!) 124/58   Pulse 76   Temp 97.6 F (36.4 C) (Oral)   Resp 16   Ht 5\' 4"  (1.626 m)   Wt 218 lb 12.8 oz (99.2 kg)   LMP 05/20/2012   SpO2 100%   BMI 37.56 kg/m       Objective:   Physical Exam   General Mental Status- Alert. General Appearance- Not in acute distress.   Heent- negative but left ear canal has moderate cerumen impaction. Very dry appearing wax. Rt canal is clear.  Skin General: Color- Normal Color. Moisture- Normal Moisture.  Neck Carotid Arteries- Normal color. Moisture- Normal Moisture. No carotid bruits. No JVD.  Chest and Lung Exam Auscultation: Breath Sounds:-Normal.  Cardiovascular Auscultation:Rythm- Regular. Murmurs & Other Heart Sounds:Auscultation of the heart reveals- No Murmurs.  Abdomen Inspection:-Inspeection Normal. Palpation/Percussion:Note:No mass. Palpation and Percussion of the abdomen reveal- Non Tender, Non Distended + BS, no rebound or guarding.    Neurologic Cranial Nerve exam:- CN III-XII  intact(No nystagmus), symmetric smile. Drift Test:- No drift. Finger to Nose:- Normal/Intact Strength:- 5/5 equal and symmetric strength both upper and lower extremities. Supine to sitting dizziness for about 1 minute then resolved  Turning head to rt laying supine made he feel dizzy.    Assessment & Plan:  For your intermittent episodes of dizziness, we did a neurologic exam today and that exam looks very good.  He did have a little bit of dizziness/vertigo on rapid changing position from supine to sitting and when you turn your head rapidly to the right.  We will get blood work today to assess electrolytes, blood volume/if anemic, and I want you to start checking your sugar and your blood pressure when you feel dizzy.  If you notice blood sugar levels less than 70 then you would need to eat something to bring your blood sugar back up.  Low levels less than 70 can cause dizziness and other symptoms.  His sugar levels are consistently above 200 then we might need to get more aggressive on diabetic management.  Also when checking blood pressure levels higher than 140/90 might cause dizziness.  If you were to get constant dizziness with other associated neurologic type symptoms then recommend ED evaluation.  I am going to write meclizine that you can use if you have dizziness that lasts more than 5 minutes.  I am going to print Epley maneuver exercises that I want you to try at home.  Also try to schedule you with PT for exercise maneuvers as well.  You do have some very hard appearing wax in the left ear canal.  Would recommend Debrox over-the-counter use for 3-4 days then schedule appointment for lavage of your ear.    Follow-up next Tuesday or as needed  Rx for electronic bp cuff written today. Can contact insurance as well. MA called in pt lancets.

## 2017-12-25 ENCOUNTER — Ambulatory Visit (INDEPENDENT_AMBULATORY_CARE_PROVIDER_SITE_OTHER): Payer: Federal, State, Local not specified - PPO | Admitting: Medical

## 2017-12-25 ENCOUNTER — Encounter: Payer: Self-pay | Admitting: Medical

## 2017-12-25 VITALS — BP 126/58 | HR 77 | Temp 97.9°F | Resp 16 | Ht 64.0 in | Wt 223.2 lb

## 2017-12-25 DIAGNOSIS — R42 Dizziness and giddiness: Secondary | ICD-10-CM

## 2017-12-25 DIAGNOSIS — E119 Type 2 diabetes mellitus without complications: Secondary | ICD-10-CM

## 2017-12-25 NOTE — Patient Instructions (Addendum)
Your dizziness has been improved since I last saw you.  Only 2 events.  One was very minimal.  No worrisome neurologic type signs and symptoms associated.  You did finally get the meclizine filled.  I want you to remember you can use that if you have dizziness lasting for more than 5 minutes.  Also remind her that you get dizziness with associated neurologic signs or symptoms then you would need to be seen in the emergency department.  I am going to ask my CMA to call your pharmacy to ask if they can give her verio flex supplies. In the end you might needs to get translation service to help you call your insurance to see which glucometer they prefer.  If we have the name of the glucometer then we can send prescription to the pharmacy.  And most likely this applied would be better covered.  Also but your insurance might send you the preferred brand of glucometer.  When you do get dizzy I still want you to check your blood pressure and sugar level to make sure abnormal level is not playing a role in the dizziness.  Follow-up as regular schedule with your PCP or as needed.

## 2017-12-25 NOTE — Progress Notes (Signed)
Subjective:    Patient ID: Rhonda Morrow, female    DOB: Jun 15, 1961, 57 y.o.   MRN: [id number]  HPI  Pt in states last Friday she was dizzy. She did not get the meclizine tablet just until yesterday. She states faint mild dizziness on Saturday. Pt states will use meclizine now that she has a prescription.  No severe vertigo type symptoms. No gross motor or sensory function deficits.   Pt was unable to check her blood sugar since she was given the wrong lancets. Pt has one touch verio flex machine given to [place] in the office.   Pt has not gotten blood pressure cuff yet.  I gave prescription Rx but it was not covered under her insurance.    Review of Systems  Constitutional: Negative for chills, fatigue and fever.  Respiratory: Negative for cough, chest tightness, shortness of breath and wheezing.   Cardiovascular: Negative for chest pain and palpitations.  Gastrointestinal: Negative for abdominal pain, diarrhea, nausea and vomiting.  Genitourinary: Negative for difficulty urinating, dysuria, enuresis, flank pain, genital sores, vaginal bleeding and vaginal pain.  Musculoskeletal: Negative for back pain, gait problem and myalgias.  Neurological: Positive for dizziness.       No dizziness presently.  Has been rare intermittent recently.  Overall seems to be improving.  Hematological: Negative for adenopathy. Does not bruise/bleed easily.  Psychiatric/Behavioral: Negative for behavioral problems and confusion. The patient is not nervous/anxious.     Past Medical History:  Diagnosis Date  . Arthritis   . Asthma   . Complication of anesthesia    one time woke up and was vey scared,17 yrs ago  . Deaf   . Diabetes mellitus without complication ([place])   . GERD (gastroesophageal reflux disease)   . Hypertension   . Left groin pain   . Thyroid disease      Social History   Socioeconomic History  . Marital status: Married    Spouse name: Not on file  . Number of children: Not on file   . Years of education: Not on file  . Highest education level: Not on file  Social Needs  . Financial resource strain: Not on file  . Food insecurity - worry: Not on file  . Food insecurity - inability: Not on file  . Transportation needs - medical: Not on file  . Transportation needs - non-medical: Not on file  Occupational History  . Occupation: disabled  Tobacco Use  . Smoking status: Never Smoker  . Smokeless tobacco: Never Used  Substance and Sexual Activity  . Alcohol use: No    Alcohol/week: 0.0 oz  . Drug use: No  . Sexual activity: No  Other Topics Concern  . Not on file  Social History Narrative   Lives with family.  Has 3 children.  Works for [employer].  Education: high school.    Past Surgical History:  Procedure Laterality Date  . CARDIAC CATHETERIZATION    . CESAREAN SECTION    . CHOLECYSTECTOMY N/A 06/20/2016   Procedure: LAPAROSCOPIC CHOLECYSTECTOMY;  Surgeon: Rolm Bookbinder, MD;  Location: [facility];  Service: General;  Laterality: N/A;  . ESOPHAGEAL MANOMETRY N/A 09/29/2013   Procedure: ESOPHAGEAL MANOMETRY (EM);  Surgeon: Milus Banister, MD;  Location: [facility];  Service: Endoscopy;  Laterality: N/A;  . TOTAL HIP ARTHROPLASTY Left 02/21/2017   Procedure: LEFT TOTAL HIP ARTHROPLASTY ANTERIOR APPROACH;  Surgeon: Leandrew Koyanagi, MD;  Location: [facility];  Service: Orthopedics;  Laterality: Left;  Family History  Problem Relation Age of Onset  . Diabetes Mother   . Heart disease Mother   . Diabetes Father     Allergies  Allergen Reactions  . Losartan Shortness Of Breath  . Oxycodone-Acetaminophen Nausea And Vomiting  . Augmentin [Amoxicillin-Pot Clavulanate] Diarrhea    Current Outpatient Medications on File Prior to Visit  Medication Sig Dispense Refill  . albuterol (PROVENTIL HFA;VENTOLIN HFA) 108 (90 Base) MCG/ACT inhaler Inhale 2 puffs into the lungs every 6 (six) hours as needed for wheezing. 1 Inhaler 5  . amLODipine (NORVASC) 5 MG tablet TAKE 1  TABLET (5 MG TOTAL) BY MOUTH DAILY. 90 tablet 1  . aspirin EC 325 MG tablet Take 1 tablet (325 mg total) by mouth 2 (two) times daily. 84 tablet 0  . azelastine (ASTELIN) 0.1 % nasal spray Place 2 sprays into both nostrils 2 (two) times daily. Use in each nostril as directed 30 mL 3  . cetirizine (ZYRTEC) 10 MG tablet Take 1 tablet (10 mg total) by mouth daily. 30 tablet 5  . [facility] ASPIRIN 325 MG tablet TAKE 1 TABLET BY MOUTH TWICE A DAY 100 tablet 0  . Fiber CHEW Chew 1 tablet by mouth daily.    . fluticasone (FLONASE) 50 MCG/ACT nasal spray PLACE 2 SPRAYS INTO BOTH NOSTRILS DAILY. 16 g 4  . glucose blood test strip Use as instructed--- one touch verio test strips 100 each 12  . glucose blood test strip Use as instructed 100 each 12  . hydrochlorothiazide (HYDRODIURIL) 12.5 MG tablet Take 1 tablet (12.5 mg total) by mouth daily. 90 tablet 1  . ipratropium-albuterol (DUONEB) 0.5-2.5 (3) MG/3ML SOLN Take 3 mLs by nebulization every 6 (six) hours as needed. 360 mL 3  . KLOR-CON M20 20 MEQ tablet TAKE 1 TABLET BY MOUTH EVERY DAY 90 tablet 1  . Lancets (ONETOUCH ULTRASOFT) lancets Use as instructed 100 each 12  . meclizine (ANTIVERT) 12.5 MG tablet Take 1 tablet (12.5 mg total) by mouth 3 (three) times daily as needed for dizziness. 30 tablet 0  . meloxicam (MOBIC) 7.5 MG tablet Take 2 tablets (15 mg total) by mouth daily as needed for pain. 30 tablet 2  . metaxalone (SKELAXIN) 800 MG tablet TAKE 1 TABLET BY MOUTH THREE TIMES A DAY 30 tablet 2  . metFORMIN (GLUCOPHAGE-XR) 500 MG 24 hr tablet TAKE 1 TABLET BY MOUTH EVERY DAY WITH BREAKFAST 90 tablet 1  . mometasone-formoterol (DULERA) 100-5 MCG/ACT AERO Inhale 2 puffs into the lungs 2 (two) times daily. 1 Inhaler 5  . montelukast (SINGULAIR) 10 MG tablet Take 1 tablet (10 mg total) by mouth at bedtime. 30 tablet 5  . Multiple Vitamin (MULTIVITAMIN) tablet Take 1 tablet by mouth daily.    . NONFORMULARY OR COMPOUNDED ITEM Nebulizer   DX ASTHMA 1 each 0    . omeprazole (PRILOSEC) 20 MG capsule Take 1 capsule (20 mg total) by mouth daily. 30 capsule 2  . ondansetron (ZOFRAN) 4 MG tablet Take 1 tablet (4 mg total) by mouth every 8 (eight) hours as needed for nausea or vomiting. 10 tablet 0  . promethazine (PHENERGAN) 25 MG tablet Take 1 tablet (25 mg total) by mouth every 6 (six) hours as needed for nausea. 30 tablet 1  . ranitidine (ZANTAC) 150 MG capsule TAKE ONE CAPSULE BY MOUTH TWICE A DAY 180 capsule 1  . senna-docusate (SENOKOT S) 8.6-50 MG tablet Take 1 tablet by mouth at bedtime as needed. 30 tablet 1  .  Spacer/Aero-Holding Chambers (AEROCHAMBER MV) inhaler Use as instructed 1 each 0  . traMADol (ULTRAM) 50 MG tablet Take 1 tablet (50 mg total) by mouth at bedtime as needed. 30 tablet 0  . zolpidem (AMBIEN) 10 MG tablet TAKE 1 TABLET BY MOUTH AT BEDTIME AS NEEDED FOR SLEEP 15 tablet 1   No current facility-administered medications on file prior to visit.     BP (!) 126/58   Pulse 77   Temp 97.9 F (36.6 C) (Oral)   Resp 16   Ht 5\' 4"  (1.626 m)   Wt 223 lb 3.2 oz (101.2 kg)   LMP 05/20/2012   SpO2 100%   BMI 38.31 kg/m       Objective:   Physical Exam  General Mental Status- Alert. General Appearance- Not in acute distress.   Skin General: Color- Normal Color. Moisture- Normal Moisture.  Neck Carotid Arteries- Normal color. Moisture- Normal Moisture. No carotid bruits. No JVD.  Chest and Lung Exam Auscultation: Breath Sounds:-Normal.  Cardiovascular Auscultation:Rythm- Regular. Murmurs & Other Heart Sounds:Auscultation of the heart reveals- No Murmurs.  Abdomen Inspection:-Inspeection Normal. Palpation/Percussion:Note:No mass. Palpation and Percussion of the abdomen reveal- Non Tender, Non Distended + BS, no rebound or guarding.    Neurologic Cranial Nerve exam:- CN III-XII intact(No nystagmus), symmetric smile. Strength:- 5/5 equal and symmetric strength both upper and lower extremities.        Assessment & Plan:  Your dizziness has been improved since I last saw you.  Only 2 events.  One was very minimal.  No worrisome neurologic type signs and symptoms associated.  You did finally get the meclizine filled.  I want you to remember you can use that if you have dizziness lasting for more than 5 minutes.  Also remind her that you get dizziness with associated neurologic signs or symptoms then you would need to be seen in the emergency department.  I am going to ask my CMA to call your pharmacy to ask if they can give her verio flex supplies. In the end you might needs to get translation service to help you call your insurance to see which glucometer they prefer.  If we have the name of the glucometer then we can send prescription to the pharmacy.  And most likely this supplies would be better covered.  Also your insurance might send you the preferred brand of glucometer.  When you do get dizzy I still want you to check your blood pressure and sugar level to make sure abnormal level is not playing a role in the dizziness.  Follow-up as regular schedule with your PCP or as needed.  Mackie Pai, PA-C

## 2017-12-29 ENCOUNTER — Other Ambulatory Visit: Payer: Self-pay | Admitting: Family Medicine

## 2018-01-08 ENCOUNTER — Ambulatory Visit (INDEPENDENT_AMBULATORY_CARE_PROVIDER_SITE_OTHER): Payer: Federal, State, Local not specified - PPO | Admitting: *Deleted

## 2018-01-08 DIAGNOSIS — E538 Deficiency of other specified B group vitamins: Secondary | ICD-10-CM

## 2018-01-08 MED ORDER — CYANOCOBALAMIN 1000 MCG/ML IJ SOLN
1000.0000 ug | Freq: Once | INTRAMUSCULAR | Status: AC
  Administered 2018-01-08: 1000 ug via INTRAMUSCULAR

## 2018-01-08 NOTE — Progress Notes (Signed)
Pre visit review using our clinic review tool, if applicable. No additional management support is needed unless otherwise documented below in the visit note.  Pt here for monthly B12 injection per order of Dr Carollee Herter.  B12 1055mcg given IM, left deltoid. Pt tolerated injection well.  Next injection scheduled for 02/12/18 at 10:45am.

## 2018-02-11 ENCOUNTER — Encounter (INDEPENDENT_AMBULATORY_CARE_PROVIDER_SITE_OTHER): Payer: Self-pay | Admitting: Orthopaedic Surgery

## 2018-02-11 ENCOUNTER — Ambulatory Visit (INDEPENDENT_AMBULATORY_CARE_PROVIDER_SITE_OTHER): Payer: Federal, State, Local not specified - PPO | Admitting: Orthopaedic Surgery

## 2018-02-11 ENCOUNTER — Ambulatory Visit (INDEPENDENT_AMBULATORY_CARE_PROVIDER_SITE_OTHER): Payer: Federal, State, Local not specified - PPO

## 2018-02-11 DIAGNOSIS — Z96642 Presence of left artificial hip joint: Secondary | ICD-10-CM | POA: Diagnosis not present

## 2018-02-11 DIAGNOSIS — M1612 Unilateral primary osteoarthritis, left hip: Secondary | ICD-10-CM | POA: Diagnosis not present

## 2018-02-11 NOTE — Progress Notes (Signed)
Office Visit Note   Patient: Rhonda Morrow           Date of Birth: [birth year]           MRN: [id number] Visit Date: 02/11/2018              Requested by: [address], [place], [place] [address] [place], [place] [id number] PCP: Carollee Herter, Alferd Apa, DO   Assessment & Plan: Visit Diagnoses:  1. Primary osteoarthritis of left hip   2. Status post total hip replacement, left     Plan: 1 year THA follow up plan  Patient is now one year out from a total hip arthroplasty. Patient is doing very well and very pleased with the results. Radiographs reveal a total hip arthroplasty in good position, with no evidence of subsidence, loosening, or complicating features. It was reinforced that prophylactic antibiotics should be taken with any procedure including but not limited to dental work or colonoscopies. We plan to follow them at 1 year intervals at this time with radiographs at each visit, and as always we should be notified with any questions or concerns in the interim.   Follow-Up Instructions: Return in about 1 year (around 02/12/2019).   Orders:  Orders Placed This Encounter  Procedures  . XR HIP UNILAT W OR W/O PELVIS 2-3 VIEWS LEFT   No orders of the defined types were placed in this encounter.     Procedures: No procedures performed   Clinical Data: No additional findings.   Subjective: Chief Complaint  Patient presents with  . Left Hip - Follow-up  . Follow-up    Patient is here for 1 year follow up of left total hip replacement.  Doing well overall.  Just some stiffness.   Review of Systems   Objective: Vital Signs: LMP 05/20/2012   Physical Exam  Ortho Exam Stable left hip exam.  Scar well healed. Specialty Comments:  No specialty comments available.  Imaging: No results found.   PMFS History: Patient Active Problem List   Diagnosis Date Noted  . Primary osteoarthritis of left hip 02/21/2017  . Status post total hip replacement, left  02/21/2017  . History of laparoscopic cholecystectomy 06/20/2016  . Cerumen impaction 06/09/2016  . Hypersomnia 05/16/2016  . Knee pain, right 05/05/2016  . RUQ pain 04/09/2016  . Abnormal CT scan 03/28/2016  . Dyspnea and respiratory abnormalities 03/15/2016  . Asthma with acute exacerbation 03/15/2016  . Asthma in adult 02/25/2016  . 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 Left hip pain 12/23/2015  . Right hamstring muscle strain 11/18/2015  . Abdominal pain, acute 09/01/2015  . Acute asthma exacerbation 04/16/2015  . Acute bronchitis 04/14/2015  . Vaginal discharge 09/14/2014  . Pap smear for cervical cancer screening 09/14/2014  . Scabies 05/06/2014  . Bed bug bite 05/06/2014  . Allergic rhinitis 04/09/2014  . Rectal itching 04/09/2014  . Bladder spasm 04/09/2014  . Knee pain, left 03/05/2014  . Hypokalemia 02/19/2014  . Nausea with vomiting 02/19/2014  . Diabetes mellitus, type II ([place]) 02/19/2014  . Edema 02/16/2014  . Disorder of rotator cuff 11/12/2013  . Routine general medical examination at a health care facility 08/20/2013  . GERD (gastroesophageal reflux disease) 06/26/2013  . Dysphagia, pharyngoesophageal phase 06/26/2013  . Suprapubic pain 05/12/2013  . Medication side effect 03/25/2013  . Diarrhea 03/25/2013  . Benign positional vertigo 01/30/2013  . Traumatic hematoma of thigh 01/15/2013  . HTN (  hypertension) 09/02/2012  . Dry skin 08/22/2012  . External hemorrhoids 08/22/2012  . Obesity 06/30/2012  . Thyromegaly 05/22/2012  . Back pain 05/22/2012  . Elevated glucose 05/22/2012  . Moderate persistent asthma 04/19/2012  . Dyspnea 01/28/2012   Past Medical History:  Diagnosis Date  . Arthritis   . Asthma   . Complication of anesthesia    one time woke up and was vey scared,17 yrs ago  . Deaf   . Diabetes mellitus without complication ([place])   . GERD (gastroesophageal reflux disease)   .  Hypertension   . Left groin pain   . Thyroid disease     Family History  Problem Relation Age of Onset  . Diabetes Mother   . Heart disease Mother   . Diabetes Father     Past Surgical History:  Procedure Laterality Date  . CARDIAC CATHETERIZATION    . CESAREAN SECTION    . CHOLECYSTECTOMY N/A 06/20/2016   Procedure: LAPAROSCOPIC CHOLECYSTECTOMY;  Surgeon: Rolm Bookbinder, MD;  Location: [facility];  Service: General;  Laterality: N/A;  . ESOPHAGEAL MANOMETRY N/A 09/29/2013   Procedure: ESOPHAGEAL MANOMETRY (EM);  Surgeon: Milus Banister, MD;  Location: [facility];  Service: Endoscopy;  Laterality: N/A;  . TOTAL HIP ARTHROPLASTY Left 02/21/2017   Procedure: LEFT TOTAL HIP ARTHROPLASTY ANTERIOR APPROACH;  Surgeon: Leandrew Koyanagi, MD;  Location: [facility];  Service: Orthopedics;  Laterality: Left;   Social History   Occupational History  . Occupation: disabled  Tobacco Use  . Smoking status: Never Smoker  . Smokeless tobacco: Never Used  Substance and Sexual Activity  . Alcohol use: No    Alcohol/week: 0.0 oz  . Drug use: No  . Sexual activity: Never

## 2018-02-12 ENCOUNTER — Ambulatory Visit: Payer: Federal, State, Local not specified - PPO

## 2018-02-26 ENCOUNTER — Ambulatory Visit (INDEPENDENT_AMBULATORY_CARE_PROVIDER_SITE_OTHER): Payer: Federal, State, Local not specified - PPO

## 2018-02-26 DIAGNOSIS — E538 Deficiency of other specified B group vitamins: Secondary | ICD-10-CM

## 2018-02-26 MED ORDER — CYANOCOBALAMIN 1000 MCG/ML IJ SOLN
1000.0000 ug | Freq: Once | INTRAMUSCULAR | Status: AC
  Administered 2018-02-26: 1000 ug via INTRAMUSCULAR

## 2018-03-15 ENCOUNTER — Encounter: Payer: Self-pay | Admitting: Family Medicine

## 2018-03-15 ENCOUNTER — Ambulatory Visit (HOSPITAL_BASED_OUTPATIENT_CLINIC_OR_DEPARTMENT_OTHER)
Admission: RE | Admit: 2018-03-15 | Discharge: 2018-03-15 | Disposition: A | Discharge status: Home / Self Care | Payer: Federal, State, Local not specified - PPO | Source: Ambulatory Visit | Attending: Family Medicine | Admitting: Family Medicine

## 2018-03-15 ENCOUNTER — Ambulatory Visit (INDEPENDENT_AMBULATORY_CARE_PROVIDER_SITE_OTHER): Payer: Federal, State, Local not specified - PPO | Admitting: Family Medicine

## 2018-03-15 VITALS — BP 116/80 | HR 89 | Temp 98.6°F | Resp 16 | Ht 64.0 in | Wt 224.2 lb

## 2018-03-15 DIAGNOSIS — M2011 Hallux valgus (acquired), right foot: Secondary | ICD-10-CM | POA: Insufficient documentation

## 2018-03-15 DIAGNOSIS — M109 Gout, unspecified: Secondary | ICD-10-CM | POA: Insufficient documentation

## 2018-03-15 DIAGNOSIS — M79671 Pain in right foot: Secondary | ICD-10-CM | POA: Diagnosis not present

## 2018-03-15 MED ORDER — MELOXICAM 7.5 MG PO TABS: ORAL_TABLET | ORAL | 1 refills | Status: DC

## 2018-03-15 NOTE — Progress Notes (Signed)
Patient ID: Rhonda Morrow, female   DOB: [birth year], 57 y.o.   MRN: [id number]     Subjective:  I acted as a Education administrator for Dr. Carollee Herter.  Rhonda Morrow, Rhonda Morrow   Patient ID: Rhonda Morrow, female    DOB: 04-12-1961, 57 y.o.   MRN: [id number]  Chief Complaint  Patient presents with  . Toe Pain    HPI  Patient is in today for right great toe pain.  Started last Saturday, the pain woke her up out of her sleep.  She has not taken anything for the pain.  She denies eating red meat or shellfish.  She did have quite a bit of whisky last week and the pain started the next day.      Patient Care Team: Carollee Herter, Alferd Apa, DO as PCP - General (Family Medicine) Jola Schmidt, MD as Consulting Physician (Ophthalmology)   Past Medical History:  Diagnosis Date  . Arthritis   . Asthma   . Complication of anesthesia    one time woke up and was vey scared,17 yrs ago  . Deaf   . Diabetes mellitus without complication ([place])   . GERD (gastroesophageal reflux disease)   . Hypertension   . Left groin pain   . Thyroid disease     Past Surgical History:  Procedure Laterality Date  . CARDIAC CATHETERIZATION    . CESAREAN SECTION    . CHOLECYSTECTOMY N/A 06/20/2016   Procedure: LAPAROSCOPIC CHOLECYSTECTOMY;  Surgeon: Rolm Bookbinder, MD;  Location: [facility];  Service: General;  Laterality: N/A;  . ESOPHAGEAL MANOMETRY N/A 09/29/2013   Procedure: ESOPHAGEAL MANOMETRY (EM);  Surgeon: Milus Banister, MD;  Location: [facility];  Service: Endoscopy;  Laterality: N/A;  . TOTAL HIP ARTHROPLASTY Left 02/21/2017   Procedure: LEFT TOTAL HIP ARTHROPLASTY ANTERIOR APPROACH;  Surgeon: Leandrew Koyanagi, MD;  Location: [facility];  Service: Orthopedics;  Laterality: Left;    Family History  Problem Relation Age of Onset  . Diabetes Mother   . Heart disease Mother   . Diabetes Father     Social History   Socioeconomic History  . Marital status: Married    Spouse name: Not on file  . Number of children: Not on file  .  Years of education: Not on file  . Highest education level: Not on file  Occupational History  . Occupation: disabled  Social Needs  . Financial resource strain: Not on file  . Food insecurity:    Worry: Not on file    Inability: Not on file  . Transportation needs:    Medical: Not on file    Non-medical: Not on file  Tobacco Use  . Smoking status: Never Smoker  . Smokeless tobacco: Never Used  Substance and Sexual Activity  . Alcohol use: No    Alcohol/week: 0.0 oz  . Drug use: No  . Sexual activity: Never  Lifestyle  . Physical activity:    Days per week: Not on file    Minutes per session: Not on file  . Stress: Not on file  Relationships  . Social connections:    Talks on phone: Not on file    Gets together: Not on file    Attends religious service: Not on file    Active member of club or organization: Not on file    Attends meetings of clubs or organizations: Not on file    Relationship status: Not on file  . Intimate partner violence:    Fear of current or ex  partner: Not on file    Emotionally abused: Not on file    Physically abused: Not on file    Forced sexual activity: Not on file  Other Topics Concern  . Not on file  Social History Narrative   Lives with family.  Has 3 children.  Works for [employer].  Education: high school.    Outpatient Medications Prior to Visit  Medication Sig Dispense Refill  . albuterol (PROVENTIL HFA;VENTOLIN HFA) 108 (90 Base) MCG/ACT inhaler Inhale 2 puffs into the lungs every 6 (six) hours as needed for wheezing. 1 Inhaler 5  . amLODipine (NORVASC) 5 MG tablet TAKE 1 TABLET (5 MG TOTAL) BY MOUTH DAILY. 90 tablet 1  . aspirin EC 325 MG tablet Take 1 tablet (325 mg total) by mouth 2 (two) times daily. 84 tablet 0  . azelastine (ASTELIN) 0.1 % nasal spray Place 2 sprays into both nostrils 2 (two) times daily. Use in each nostril as directed 30 mL 3  . cetirizine (ZYRTEC) 10 MG tablet Take 1 tablet (10 mg total) by mouth daily. 30  tablet 5  . [facility] ASPIRIN 325 MG tablet TAKE 1 TABLET BY MOUTH TWICE A DAY 100 tablet 0  . Fiber CHEW Chew 1 tablet by mouth daily.    . fluticasone (FLONASE) 50 MCG/ACT nasal spray PLACE 2 SPRAYS INTO BOTH NOSTRILS DAILY. 16 g 4  . glucose blood test strip Use as instructed--- one touch verio test strips 100 each 12  . glucose blood test strip Use as instructed 100 each 12  . hydrochlorothiazide (HYDRODIURIL) 12.5 MG tablet Take 1 tablet (12.5 mg total) by mouth daily. 90 tablet 1  . ipratropium-albuterol (DUONEB) 0.5-2.5 (3) MG/3ML SOLN Take 3 mLs by nebulization every 6 (six) hours as needed. 360 mL 3  . KLOR-CON M20 20 MEQ tablet TAKE 1 TABLET BY MOUTH EVERY DAY 90 tablet 1  . Lancets (ONETOUCH ULTRASOFT) lancets Use as instructed 100 each 12  . meclizine (ANTIVERT) 12.5 MG tablet Take 1 tablet (12.5 mg total) by mouth 3 (three) times daily as needed for dizziness. 30 tablet 0  . metaxalone (SKELAXIN) 800 MG tablet TAKE 1 TABLET BY MOUTH THREE TIMES A DAY 30 tablet 2  . metFORMIN (GLUCOPHAGE-XR) 500 MG 24 hr tablet TAKE 1 TABLET BY MOUTH EVERY DAY WITH BREAKFAST 90 tablet 1  . mometasone-formoterol (DULERA) 100-5 MCG/ACT AERO Inhale 2 puffs into the lungs 2 (two) times daily. 1 Inhaler 5  . montelukast (SINGULAIR) 10 MG tablet Take 1 tablet (10 mg total) by mouth at bedtime. 30 tablet 5  . Multiple Vitamin (MULTIVITAMIN) tablet Take 1 tablet by mouth daily.    . NONFORMULARY OR COMPOUNDED ITEM Nebulizer   DX ASTHMA 1 each 0  . omeprazole (PRILOSEC) 20 MG capsule Take 1 capsule (20 mg total) by mouth daily. 30 capsule 2  . ondansetron (ZOFRAN) 4 MG tablet Take 1 tablet (4 mg total) by mouth every 8 (eight) hours as needed for nausea or vomiting. 10 tablet 0  . promethazine (PHENERGAN) 25 MG tablet Take 1 tablet (25 mg total) by mouth every 6 (six) hours as needed for nausea. 30 tablet 1  . ranitidine (ZANTAC) 150 MG tablet TAKE 1 TABLET BY MOUTH TWICE A DAY 180 tablet 1  . senna-docusate  (SENOKOT S) 8.6-50 MG tablet Take 1 tablet by mouth at bedtime as needed. 30 tablet 1  . Spacer/Aero-Holding Chambers (AEROCHAMBER MV) inhaler Use as instructed 1 each 0  . traMADol (ULTRAM) 50  MG tablet Take 1 tablet (50 mg total) by mouth at bedtime as needed. 30 tablet 0  . zolpidem (AMBIEN) 10 MG tablet TAKE 1 TABLET BY MOUTH AT BEDTIME AS NEEDED FOR SLEEP 15 tablet 1  . meloxicam (MOBIC) 7.5 MG tablet Take 2 tablets (15 mg total) by mouth daily as needed for pain. 30 tablet 2   No facility-administered medications prior to visit.     Allergies  Allergen Reactions  . Losartan Shortness Of Breath  . Oxycodone-Acetaminophen Nausea And Vomiting  . Augmentin [Amoxicillin-Pot Clavulanate] Diarrhea    Review of Systems  Constitutional: Negative for fever and malaise/fatigue.  HENT: Negative for congestion.   Eyes: Negative for blurred vision.  Respiratory: Negative for cough and shortness of breath.   Cardiovascular: Negative for chest pain, palpitations and leg swelling.  Gastrointestinal: Negative for vomiting.  Musculoskeletal: Negative for back pain.       Right great toe pain  Skin: Negative for rash.  Neurological: Negative for loss of consciousness and headaches.       Objective:    Physical Exam  Constitutional: She is oriented to person, place, and time. She appears well-developed and well-nourished.  HENT:  Head: Normocephalic and atraumatic.  Eyes: Conjunctivae and EOM are normal.  Neck: Normal range of motion. Neck supple. No JVD present. Carotid bruit is not present. No thyromegaly present.  Cardiovascular: Normal rate, regular rhythm and normal heart sounds.  No murmur heard. Pulmonary/Chest: Effort normal and breath sounds normal. No respiratory distress. She has no wheezes. She has no rales. She exhibits no tenderness.  Musculoskeletal: She exhibits no edema.       Right foot: There is decreased range of motion, tenderness and swelling.        Feet:  Neurological: She is alert and oriented to person, place, and time.  Psychiatric: She has a normal mood and affect.  Nursing note and vitals reviewed.   BP 116/80 (BP Location: Right Arm, Cuff Size: Large)   Pulse 89   Temp 98.6 F (37 C) (Oral)   Resp 16   Ht 5\' 4"  (1.626 m)   Wt 224 lb 3.2 oz (101.7 kg)   LMP 05/20/2012   SpO2 98%   BMI 38.48 kg/m  Wt Readings from Last 3 Encounters:  03/15/18 224 lb 3.2 oz (101.7 kg)  12/25/17 223 lb 3.2 oz (101.2 kg)  12/19/17 218 lb 12.8 oz (99.2 kg)   BP Readings from Last 3 Encounters:  03/15/18 116/80  12/25/17 (!) 126/58  12/19/17 (!) 124/58     Immunization History  Administered Date(s) Administered  . DT 04/07/2011  . Influenza Split 08/07/2011, 08/05/2012, 08/22/2012, 08/31/2014  . Influenza,inj,Quad PF,6+ Mos 08/20/2013, 10/22/2015, 08/01/2016, 09/11/2017  . Pneumococcal Polysaccharide-23 10/02/2012  . Tdap 12/18/2011    Health Maintenance  Topic Date Due  . FOOT EXAM  03/07/2016  . URINE MICROALBUMIN  10/26/2016  . PNEUMOCOCCAL POLYSACCHARIDE VACCINE (2) 10/02/2017  . OPHTHALMOLOGY EXAM  10/17/2017  . DEXA SCAN  11/22/2017  . MAMMOGRAM  11/24/2017  . INFLUENZA VACCINE  06/06/2018  . HEMOGLOBIN A1C  06/18/2018  . PAP SMEAR  05/18/2019  . TETANUS/TDAP  05/12/2022  . COLONOSCOPY  09/18/2023  . Hepatitis C Screening  Completed  . HIV Screening  Completed    Lab Results  Component Value Date   WBC 6.6 12/19/2017   HGB 14.0 12/19/2017   HCT 42.7 12/19/2017   PLT 224.0 12/19/2017   GLUCOSE 106 (H) 12/19/2017   CHOL 175  12/04/2016   TRIG 80.0 12/04/2016   HDL 68.10 12/04/2016   LDLCALC 91 12/04/2016   ALT 18 12/19/2017   AST 19 12/19/2017   NA 140 12/19/2017   K 3.7 12/19/2017   CL 104 12/19/2017   CREATININE 0.93 12/19/2017   BUN 14 12/19/2017   CO2 32 12/19/2017   TSH 0.87 12/04/2016   INR 1.05 02/12/2017   HGBA1C 6.4 12/19/2017   MICROALBUR 0.8 10/27/2015    Lab Results  Component Value  Date   TSH 0.87 12/04/2016   Lab Results  Component Value Date   WBC 6.6 12/19/2017   HGB 14.0 12/19/2017   HCT 42.7 12/19/2017   MCV 81.8 12/19/2017   PLT 224.0 12/19/2017   Lab Results  Component Value Date   NA 140 12/19/2017   K 3.7 12/19/2017   CO2 32 12/19/2017   GLUCOSE 106 (H) 12/19/2017   BUN 14 12/19/2017   CREATININE 0.93 12/19/2017   BILITOT 0.5 12/19/2017   ALKPHOS 72 12/19/2017   AST 19 12/19/2017   ALT 18 12/19/2017   PROT 7.2 12/19/2017   ALBUMIN 3.8 12/19/2017   CALCIUM 9.5 12/19/2017   ANIONGAP 9 02/22/2017   GFR 80.71 12/19/2017   Lab Results  Component Value Date   CHOL 175 12/04/2016   Lab Results  Component Value Date   HDL 68.10 12/04/2016   Lab Results  Component Value Date   LDLCALC 91 12/04/2016   Lab Results  Component Value Date   TRIG 80.0 12/04/2016   Lab Results  Component Value Date   CHOLHDL 3 12/04/2016   Lab Results  Component Value Date   HGBA1C 6.4 12/19/2017         Assessment & Plan:   Problem List Items Addressed This Visit    None    Visit Diagnoses    Gout of big toe    -  Primary   Relevant Medications   meloxicam (MOBIC) 7.5 MG tablet   Other Relevant Orders   CBC with Differential/Platelet   Comprehensive metabolic panel   Uric acid   DG Foot Complete Right      I have changed Rhonda Morrow's meloxicam. I am also having her maintain her multivitamin, Fiber, ipratropium-albuterol, NONFORMULARY OR COMPOUNDED ITEM, AEROCHAMBER MV, omeprazole, azelastine, fluticasone, glucose blood, ondansetron, aspirin EC, promethazine, senna-docusate, CVS ASPIRIN, traMADol, albuterol, mometasone-formoterol, montelukast, zolpidem, hydrochlorothiazide, metFORMIN, amLODipine, metaxalone, KLOR-CON M20, cetirizine, onetouch ultrasoft, glucose blood, meclizine, and ranitidine.  Meds ordered this encounter  Medications  . meloxicam (MOBIC) 7.5 MG tablet    Sig: 1-2 po qd prn    Dispense:  60 tablet    Refill:  1     CMA served as scribe during this visit. History, Physical and Plan performed by medical provider. Documentation and orders reviewed and attested to.  Ann Held, DO

## 2018-03-15 NOTE — Patient Instructions (Signed)

## 2018-03-16 LAB — COMPREHENSIVE METABOLIC PANEL
AG Ratio: 1.3 (calc) (ref 1.0–2.5)
ALBUMIN MSPROF: 3.7 g/dL (ref 3.6–5.1)
ALKALINE PHOSPHATASE (APISO): 76 U/L (ref 33–130)
ALT: 14 U/L (ref 6–29)
AST: 16 U/L (ref 10–35)
BILIRUBIN TOTAL: 0.4 mg/dL (ref 0.2–1.2)
BUN: 12 mg/dL (ref 7–25)
CALCIUM: 9.9 mg/dL (ref 8.6–10.4)
CHLORIDE: 105 mmol/L (ref 98–110)
CO2: 29 mmol/L (ref 20–32)
CREATININE: 0.78 mg/dL (ref 0.50–1.05)
GLOBULIN: 2.9 g/dL (ref 1.9–3.7)
Glucose, Bld: 93 mg/dL (ref 65–99)
POTASSIUM: 3.7 mmol/L (ref 3.5–5.3)
Sodium: 141 mmol/L (ref 135–146)
TOTAL PROTEIN: 6.6 g/dL (ref 6.1–8.1)

## 2018-03-16 LAB — CBC WITH DIFFERENTIAL/PLATELET
BASOS PCT: 0.8 %
Basophils Absolute: 50 cells/uL (ref 0–200)
EOS PCT: 1.8 %
Eosinophils Absolute: 112 cells/uL (ref 15–500)
HCT: 40.1 % (ref 35.0–45.0)
Hemoglobin: 13.8 g/dL (ref 11.7–15.5)
Lymphs Abs: 1879 cells/uL (ref 850–3900)
MCH: 27.3 pg (ref 27.0–33.0)
MCHC: 34.4 g/dL (ref 32.0–36.0)
MCV: 79.2 fL — AB (ref 80.0–100.0)
MPV: 12.1 fL (ref 7.5–12.5)
Monocytes Relative: 9.6 %
NEUTROS ABS: 3565 {cells}/uL (ref 1500–7800)
Neutrophils Relative %: 57.5 %
Platelets: 243 10*3/uL (ref 140–400)
RBC: 5.06 10*6/uL (ref 3.80–5.10)
RDW: 14 % (ref 11.0–15.0)
Total Lymphocyte: 30.3 %
WBC: 6.2 10*3/uL (ref 3.8–10.8)
WBCMIX: 595 {cells}/uL (ref 200–950)

## 2018-03-16 LAB — URIC ACID: URIC ACID, SERUM: 8.2 mg/dL — AB (ref 2.5–7.0)

## 2018-03-18 ENCOUNTER — Other Ambulatory Visit: Payer: Self-pay | Admitting: Family Medicine

## 2018-03-18 DIAGNOSIS — M10079 Idiopathic gout, unspecified ankle and foot: Secondary | ICD-10-CM

## 2018-03-18 MED ORDER — ALLOPURINOL 100 MG PO TABS: 100.0000 mg | ORAL_TABLET | Freq: Every day | ORAL | 6 refills | Status: DC

## 2018-03-28 ENCOUNTER — Other Ambulatory Visit: Payer: Self-pay | Admitting: Family Medicine

## 2018-03-28 ENCOUNTER — Ambulatory Visit (INDEPENDENT_AMBULATORY_CARE_PROVIDER_SITE_OTHER): Payer: Federal, State, Local not specified - PPO | Admitting: Family Medicine

## 2018-03-28 ENCOUNTER — Encounter: Payer: Self-pay | Admitting: Family Medicine

## 2018-03-28 VITALS — BP 118/82 | HR 77 | Temp 98.1°F | Resp 16 | Ht 64.0 in | Wt 225.4 lb

## 2018-03-28 DIAGNOSIS — I10 Essential (primary) hypertension: Secondary | ICD-10-CM | POA: Diagnosis not present

## 2018-03-28 DIAGNOSIS — E538 Deficiency of other specified B group vitamins: Secondary | ICD-10-CM | POA: Diagnosis not present

## 2018-03-28 DIAGNOSIS — R82998 Other abnormal findings in urine: Secondary | ICD-10-CM

## 2018-03-28 DIAGNOSIS — E1151 Type 2 diabetes mellitus with diabetic peripheral angiopathy without gangrene: Secondary | ICD-10-CM | POA: Insufficient documentation

## 2018-03-28 DIAGNOSIS — E1165 Type 2 diabetes mellitus with hyperglycemia: Secondary | ICD-10-CM | POA: Diagnosis not present

## 2018-03-28 DIAGNOSIS — E785 Hyperlipidemia, unspecified: Secondary | ICD-10-CM | POA: Diagnosis not present

## 2018-03-28 DIAGNOSIS — IMO0002 Reserved for concepts with insufficient information to code with codable children: Secondary | ICD-10-CM | POA: Insufficient documentation

## 2018-03-28 LAB — POC URINALSYSI DIPSTICK (AUTOMATED)
Glucose, UA: NEGATIVE
NITRITE UA: NEGATIVE
Protein, UA: POSITIVE — AB
RBC UA: NEGATIVE
SPEC GRAV UA: 1.02 (ref 1.010–1.025)
UROBILINOGEN UA: 1 U/dL
pH, UA: 6 (ref 5.0–8.0)

## 2018-03-28 LAB — LIPID PANEL
CHOL/HDL RATIO: 3
Cholesterol: 156 mg/dL (ref 0–200)
HDL: 56.2 mg/dL (ref >39.00)
LDL CALC: 85 mg/dL (ref 0–99)
NonHDL: 99.4
TRIGLYCERIDES: 71 mg/dL (ref 0.0–149.0)
VLDL: 14.2 mg/dL (ref 0.0–40.0)

## 2018-03-28 LAB — COMPREHENSIVE METABOLIC PANEL
ALT: 15 U/L (ref 0–35)
AST: 16 U/L (ref 0–37)
Albumin: 3.7 g/dL (ref 3.5–5.2)
Alkaline Phosphatase: 72 U/L (ref 39–117)
BILIRUBIN TOTAL: 0.5 mg/dL (ref 0.2–1.2)
BUN: 12 mg/dL (ref 6–23)
CHLORIDE: 101 meq/L (ref 96–112)
CO2: 32 mEq/L (ref 19–32)
CREATININE: 0.82 mg/dL (ref 0.40–1.20)
Calcium: 9.6 mg/dL (ref 8.4–10.5)
GFR: 93.24 mL/min (ref >60.00)
GLUCOSE: 99 mg/dL (ref 70–99)
Potassium: 3.8 mEq/L (ref 3.5–5.1)
SODIUM: 140 meq/L (ref 135–145)
Total Protein: 6.9 g/dL (ref 6.0–8.3)

## 2018-03-28 LAB — VITAMIN B12: Vitamin B-12: 881 pg/mL (ref 211–911)

## 2018-03-28 LAB — HEMOGLOBIN A1C: HEMOGLOBIN A1C: 6.5 % (ref 4.6–6.5)

## 2018-03-28 MED ORDER — CYANOCOBALAMIN 1000 MCG/ML IJ SOLN
1000.0000 ug | Freq: Once | INTRAMUSCULAR | Status: AC
  Administered 2018-03-28: 1000 ug via INTRAMUSCULAR

## 2018-03-28 NOTE — Progress Notes (Signed)
Patient ID: Rhonda Morrow, female    DOB: 06-26-1961  Age: 57 y.o. MRN: [id number]    Subjective:  Subjective  HPI Rhonda Morrow presents for f/u dm and b12.  Her foot is much better.  No other complaints.    Review of Systems  Constitutional: Negative for activity change, appetite change, fatigue and unexpected weight change.  Respiratory: Negative for cough and shortness of breath.   Cardiovascular: Negative for chest pain and palpitations.  Psychiatric/Behavioral: Negative for behavioral problems and dysphoric mood. The patient is not nervous/anxious.     History Past Medical History:  Diagnosis Date  . Arthritis   . Asthma   . Complication of anesthesia    one time woke up and was vey scared,17 yrs ago  . Deaf   . Diabetes mellitus without complication ([place])   . GERD (gastroesophageal reflux disease)   . Hypertension   . Left groin pain   . Thyroid disease     She has a past surgical history that includes Cesarean section; Cardiac catheterization; Esophageal manometry (N/A, 09/29/2013); Cholecystectomy (N/A, 06/20/2016); and Total hip arthroplasty (Left, 02/21/2017).   Her family history includes Diabetes in her father and mother; Heart disease in her mother.She reports that she has never smoked. She has never used smokeless tobacco. She reports that she does not drink alcohol or use drugs.  Current Outpatient Medications on File Prior to Visit  Medication Sig Dispense Refill  . albuterol (PROVENTIL HFA;VENTOLIN HFA) 108 (90 Base) MCG/ACT inhaler Inhale 2 puffs into the lungs every 6 (six) hours as needed for wheezing. 1 Inhaler 5  . allopurinol (ZYLOPRIM) 100 MG tablet Take 1 tablet (100 mg total) by mouth daily. 30 tablet 6  . aspirin EC 325 MG tablet Take 1 tablet (325 mg total) by mouth 2 (two) times daily. 84 tablet 0  . azelastine (ASTELIN) 0.1 % nasal spray Place 2 sprays into both nostrils 2 (two) times daily. Use in each nostril as directed 30 mL 3  . cetirizine (ZYRTEC)  10 MG tablet Take 1 tablet (10 mg total) by mouth daily. 30 tablet 5  . [facility]S ASPIRIN 325 MG tablet TAKE 1 TABLET BY MOUTH TWICE A DAY 100 tablet 0  . Fiber CHEW Chew 1 tablet by mouth daily.    . fluticasone (FLONASE) 50 MCG/ACT nasal spray PLACE 2 SPRAYS INTO BOTH NOSTRILS DAILY. 16 g 4  . glucose blood test strip Use as instructed--- one touch verio test strips 100 each 12  . glucose blood test strip Use as instructed 100 each 12  . hydrochlorothiazide (HYDRODIURIL) 12.5 MG tablet Take 1 tablet (12.5 mg total) by mouth daily. 90 tablet 1  . ipratropium-albuterol (DUONEB) 0.5-2.5 (3) MG/3ML SOLN Take 3 mLs by nebulization every 6 (six) hours as needed. 360 mL 3  . KLOR-CON M20 20 MEQ tablet TAKE 1 TABLET BY MOUTH EVERY DAY 90 tablet 1  . Lancets (ONETOUCH ULTRASOFT) lancets Use as instructed 100 each 12  . meclizine (ANTIVERT) 12.5 MG tablet Take 1 tablet (12.5 mg total) by mouth 3 (three) times daily as needed for dizziness. 30 tablet 0  . meloxicam (MOBIC) 7.5 MG tablet 1-2 po qd prn 60 tablet 1  . metaxalone (SKELAXIN) 800 MG tablet TAKE 1 TABLET BY MOUTH THREE TIMES A DAY 30 tablet 2  . metFORMIN (GLUCOPHAGE-XR) 500 MG 24 hr tablet TAKE 1 TABLET BY MOUTH EVERY DAY WITH BREAKFAST 90 tablet 1  . mometasone-formoterol (DULERA) 100-5 MCG/ACT AERO Inhale 2 puffs into  the lungs 2 (two) times daily. 1 Inhaler 5  . montelukast (SINGULAIR) 10 MG tablet Take 1 tablet (10 mg total) by mouth at bedtime. 30 tablet 5  . Multiple Vitamin (MULTIVITAMIN) tablet Take 1 tablet by mouth daily.    . NONFORMULARY OR COMPOUNDED ITEM Nebulizer   DX ASTHMA 1 each 0  . omeprazole (PRILOSEC) 20 MG capsule Take 1 capsule (20 mg total) by mouth daily. 30 capsule 2  . ondansetron (ZOFRAN) 4 MG tablet Take 1 tablet (4 mg total) by mouth every 8 (eight) hours as needed for nausea or vomiting. 10 tablet 0  . promethazine (PHENERGAN) 25 MG tablet Take 1 tablet (25 mg total) by mouth every 6 (six) hours as needed for nausea.  30 tablet 1  . ranitidine (ZANTAC) 150 MG tablet TAKE 1 TABLET BY MOUTH TWICE A DAY 180 tablet 1  . senna-docusate (SENOKOT S) 8.6-50 MG tablet Take 1 tablet by mouth at bedtime as needed. 30 tablet 1  . Spacer/Aero-Holding Chambers (AEROCHAMBER MV) inhaler Use as instructed 1 each 0  . traMADol (ULTRAM) 50 MG tablet Take 1 tablet (50 mg total) by mouth at bedtime as needed. 30 tablet 0  . zolpidem (AMBIEN) 10 MG tablet TAKE 1 TABLET BY MOUTH AT BEDTIME AS NEEDED FOR SLEEP 15 tablet 1   No current facility-administered medications on file prior to visit.      Objective:  Objective  Physical Exam  Constitutional: She is oriented to person, place, and time. She appears well-developed and well-nourished.  HENT:  Head: Normocephalic and atraumatic.  Eyes: Conjunctivae and EOM are normal.  Neck: Normal range of motion. Neck supple. No JVD present. Carotid bruit is not present. No thyromegaly present.  Cardiovascular: Normal rate, regular rhythm and normal heart sounds.  No murmur heard. Pulmonary/Chest: Effort normal and breath sounds normal. No respiratory distress. She has no wheezes. She has no rales. She exhibits no tenderness.  Musculoskeletal: She exhibits no edema.  Neurological: She is alert and oriented to person, place, and time.  Psychiatric: She has a normal mood and affect.  Nursing note and vitals reviewed.  BP 118/82 (BP Location: Right Arm, Cuff Size: Large)   Pulse 77   Temp 98.1 F (36.7 C) (Oral)   Resp 16   Ht 5\' 4"  (1.626 m)   Wt 225 lb 6.4 oz (102.2 kg)   LMP 05/20/2012   SpO2 98%   BMI 38.69 kg/m  Wt Readings from Last 3 Encounters:  03/28/18 225 lb 6.4 oz (102.2 kg)  03/15/18 224 lb 3.2 oz (101.7 kg)  12/25/17 223 lb 3.2 oz (101.2 kg)     Lab Results  Component Value Date   WBC 6.2 03/15/2018   HGB 13.8 03/15/2018   HCT 40.1 03/15/2018   PLT 243 03/15/2018   GLUCOSE 99 03/28/2018   CHOL 156 03/28/2018   TRIG 71.0 03/28/2018   HDL 56.20  03/28/2018   LDLCALC 85 03/28/2018   ALT 15 03/28/2018   AST 16 03/28/2018   NA 140 03/28/2018   K 3.8 03/28/2018   CL 101 03/28/2018   CREATININE 0.82 03/28/2018   BUN 12 03/28/2018   CO2 32 03/28/2018   TSH 0.87 12/04/2016   INR 1.05 02/12/2017   HGBA1C 6.5 03/28/2018   MICROALBUR 0.8 10/27/2015    Dg Foot Complete Right  Result Date: 03/16/2018 CLINICAL DATA:  Pt with foot pain x 1 week; no known injury; major pain in right big toe EXAM: RIGHT FOOT COMPLETE -  3+ VIEW COMPARISON:  None. FINDINGS: There is no acute fracture or subluxation. Mild hallux valgus deformity is noted. No significant erosions. There is normal bone mineral density. Small plantar calcaneal spur is present. IMPRESSION: No evidence for acute  abnormality.  Mild hallux valgus deformity. Electronically Signed   By: Nolon Nations M.D.   On: 03/16/2018 16:13     Assessment & Plan:  Plan  I am having [name] maintain her multivitamin, Fiber, ipratropium-albuterol, NONFORMULARY OR COMPOUNDED ITEM, AEROCHAMBER MV, omeprazole, azelastine, fluticasone, glucose blood, ondansetron, aspirin EC, promethazine, senna-docusate, [facility]S ASPIRIN, traMADol, albuterol, mometasone-formoterol, montelukast, zolpidem, hydrochlorothiazide, metFORMIN, metaxalone, KLOR-CON M20, cetirizine, onetouch ultrasoft, glucose blood, meclizine, ranitidine, meloxicam, and allopurinol. We administered cyanocobalamin.  Meds ordered this encounter  Medications  . cyanocobalamin ((VITAMIN B-12)) injection 1,000 mcg    Problem List Items Addressed This Visit      Unprioritized   B12 deficiency   Relevant Orders   Vitamin B12 (Completed)   DM (diabetes mellitus) type II uncontrolled, periph vascular disorder ([place])    hgba1c to be done, minimize simple carbs. Increase exercise as tolerated. Continue current meds       Relevant Orders   Hemoglobin A1c (Completed)   HTN (hypertension)    Well controlled, no changes to meds. Encouraged heart  healthy diet such as the DASH diet and exercise as tolerated.       Relevant Orders   Comprehensive metabolic panel (Completed)   POCT Urinalysis Dipstick (Automated) (Completed)   Hyperlipidemia    Tolerating statin, encouraged heart healthy diet, avoid trans fats, minimize simple carbs and saturated fats. Increase exercise as tolerated      Relevant Orders   Lipid panel (Completed)   Vitamin B 12 deficiency - Primary   Relevant Medications   cyanocobalamin ((VITAMIN B-12)) injection 1,000 mcg (Completed)   Other Relevant Orders   Vitamin B12 (Completed)    Other Visit Diagnoses    Leukocytes in urine       Relevant Orders   Urine Culture (Completed)      Follow-up: Return if symptoms worsen or fail to improve, for as scheduled already.  Ann Held, DO

## 2018-03-28 NOTE — Patient Instructions (Signed)
Vitamin B12 Deficiency Vitamin B12 deficiency occurs when the body does not have enough vitamin B12. Vitamin B12 is an important vitamin. The body needs vitamin B12:  To make red blood cells.  To make DNA. This is the genetic material inside cells.  To help the nerves work properly so they can carry messages from the brain to the body.  Vitamin B12 deficiency can cause various health problems, such as a low red blood cell count (anemia) or nerve damage. What are the causes? This condition may be caused by:  Not eating enough foods that contain vitamin B12.  Not having enough stomach acid and digestive fluids to properly absorb vitamin B12 from the food that you eat.  Certain digestive system diseases that make it hard to absorb vitamin B12. These diseases include Crohn disease, chronic pancreatitis, and cystic fibrosis.  Pernicious anemia. This is a condition in which the body does not make enough of a protein (intrinsic factor), resulting in too few red blood cells.  Having a surgery in which part of the stomach or small intestine is removed.  Taking certain medicines that make it hard for the body to absorb vitamin B12. These medicines include: ? Heartburn medicine (antacids and proton pump inhibitors). ? An antibiotic medicine called neomycin. ? Some medicines that are used to treat diabetes, tuberculosis, gout, or high cholesterol.  What increases the risk? The following factors may make you more likely to develop a B12 deficiency:  Being older than age 50.  Eating a vegetarian or vegan diet, especially while you are pregnant.  Eating a poor diet while you are pregnant.  Taking certain drugs.  Having alcoholism.  What are the signs or symptoms? In some cases, there are no symptoms of this condition. If the condition leads to anemia or nerve damage, various symptoms can occur, such as:  Weakness.  Fatigue.  Loss of appetite.  Weight loss.  Numbness or tingling  in your hands and feet.  Redness and burning of the tongue.  Confusion or memory problems.  Depression.  Sensory problems, such as color blindness, ringing in the ears, or loss of taste.  Diarrhea or constipation.  Trouble walking.  If anemia is severe, symptoms can include:  Shortness of breath.  Dizziness.  Rapid heart rate (tachycardia).  How is this diagnosed? This condition may be diagnosed with a blood test to measure the level of vitamin B12 in your blood. You may have other tests to help find the cause of your vitamin B12 deficiency. These tests may include:  A complete blood count (CBC). This is a group of tests that measure certain characteristics of blood cells.  A blood test to measure intrinsic factor.  An endoscopy. In this procedure, a thin tube with a camera on the end is used to look into your stomach or intestines.  How is this treated? Treatment for this condition depends on the cause. Common treatment options include:  Changing your eating and drinking habits, such as: ? Eating more foods that contain vitamin B12. ? Drinking less alcohol or no alcohol.  Taking vitamin B12 supplements. Your health care provider will tell you which dosage is best for you.  Getting vitamin B12 injections.  Follow these instructions at home:  Take supplements only as told by your health care provider. Follow the directions carefully.  Get any injections that are prescribed by your health care provider.  Do not miss your appointments.  Eat lots of healthy foods that contain vitamin B12.   Ask your health care provider if you should work with a dietitian. Foods that contain vitamin B12 include: ? Meat. ? Meat from birds (poultry). ? Fish. ? Eggs. ? Cereal and dairy products that are fortified. This means that vitamin B12 has been added to the food. Check the label on the package to see if the food is fortified.  Do not abuse alcohol.  Keep all follow-up visits as  told by your health care provider. This is important. Contact a health care provider if:  Your symptoms come back. Get help right away if:  You develop shortness of breath.  You have chest pain.  You become dizzy or you lose consciousness. This information is not intended to replace advice given to you by your health care provider. Make sure you discuss any questions you have with your health care provider. Document Released: 01/15/2012 Document Revised: 04/05/2016 Document Reviewed: 03/10/2015 Elsevier Interactive Patient Education  2018 [employer]  

## 2018-03-29 LAB — URINE CULTURE
MICRO NUMBER:: 90627952
SPECIMEN QUALITY:: ADEQUATE

## 2018-03-29 NOTE — Assessment & Plan Note (Signed)
hgba1c to be done, minimize simple carbs. Increase exercise as tolerated. Continue current meds  

## 2018-03-29 NOTE — Assessment & Plan Note (Signed)
Tolerating statin, encouraged heart healthy diet, avoid trans fats, minimize simple carbs and saturated fats. Increase exercise as tolerated 

## 2018-03-29 NOTE — Assessment & Plan Note (Signed)
Well controlled, no changes to meds. Encouraged heart healthy diet such as the DASH diet and exercise as tolerated.  °

## 2018-04-16 ENCOUNTER — Other Ambulatory Visit: Payer: Self-pay | Admitting: Family Medicine

## 2018-04-16 DIAGNOSIS — I10 Essential (primary) hypertension: Secondary | ICD-10-CM

## 2018-04-22 ENCOUNTER — Other Ambulatory Visit: Payer: Self-pay | Admitting: *Deleted

## 2018-04-22 DIAGNOSIS — M109 Gout, unspecified: Secondary | ICD-10-CM

## 2018-04-23 ENCOUNTER — Telehealth: Payer: Self-pay | Admitting: *Deleted

## 2018-04-23 ENCOUNTER — Other Ambulatory Visit (INDEPENDENT_AMBULATORY_CARE_PROVIDER_SITE_OTHER): Payer: Federal, State, Local not specified - PPO

## 2018-04-23 DIAGNOSIS — M109 Gout, unspecified: Secondary | ICD-10-CM | POA: Diagnosis not present

## 2018-04-23 LAB — BASIC METABOLIC PANEL
BUN: 14 mg/dL (ref 6–23)
CHLORIDE: 105 meq/L (ref 96–112)
CO2: 29 mEq/L (ref 19–32)
Calcium: 9.5 mg/dL (ref 8.4–10.5)
Creatinine, Ser: 0.8 mg/dL (ref 0.40–1.20)
GFR: 95.91 mL/min (ref >60.00)
Glucose, Bld: 95 mg/dL (ref 70–99)
POTASSIUM: 3.5 meq/L (ref 3.5–5.1)
SODIUM: 142 meq/L (ref 135–145)

## 2018-04-23 LAB — URIC ACID: URIC ACID, SERUM: 7 mg/dL (ref 2.4–7.0)

## 2018-04-23 NOTE — Telephone Encounter (Signed)
Error

## 2018-04-30 ENCOUNTER — Ambulatory Visit (INDEPENDENT_AMBULATORY_CARE_PROVIDER_SITE_OTHER): Payer: Federal, State, Local not specified - PPO

## 2018-04-30 ENCOUNTER — Other Ambulatory Visit: Payer: Self-pay

## 2018-04-30 DIAGNOSIS — E538 Deficiency of other specified B group vitamins: Secondary | ICD-10-CM

## 2018-04-30 DIAGNOSIS — M109 Gout, unspecified: Secondary | ICD-10-CM

## 2018-04-30 DIAGNOSIS — IMO0002 Reserved for concepts with insufficient information to code with codable children: Secondary | ICD-10-CM

## 2018-04-30 DIAGNOSIS — E1165 Type 2 diabetes mellitus with hyperglycemia: Secondary | ICD-10-CM

## 2018-04-30 DIAGNOSIS — E1151 Type 2 diabetes mellitus with diabetic peripheral angiopathy without gangrene: Secondary | ICD-10-CM

## 2018-04-30 MED ORDER — CYANOCOBALAMIN 1000 MCG/ML IJ SOLN
1000.0000 ug | Freq: Once | INTRAMUSCULAR | Status: AC
  Administered 2018-04-30: 1000 ug via INTRAMUSCULAR

## 2018-04-30 MED ORDER — ALLOPURINOL 100 MG PO TABS: 200.0000 mg | ORAL_TABLET | Freq: Every day | ORAL | 2 refills | Status: DC

## 2018-04-30 MED ORDER — COLCHICINE 0.6 MG PO TABS: 0.6000 mg | ORAL_TABLET | Freq: Every day | ORAL | 0 refills | Status: DC

## 2018-04-30 MED ORDER — METFORMIN HCL ER 500 MG PO TB24: ORAL_TABLET | ORAL | 1 refills | Status: DC

## 2018-04-30 NOTE — Progress Notes (Signed)
Noted  Yvonne R Lowne Chase, DO  

## 2018-04-30 NOTE — Progress Notes (Signed)
Pt here for monthly B12 injection per Dr. Etter Sjogren  B12 1040mcg given IM in right deltoid and pt tolerated injection well.  Next b12 injection is scheduled for 05/28/18 at 2:00. Patient made aware.

## 2018-05-24 ENCOUNTER — Other Ambulatory Visit: Payer: Self-pay | Admitting: Family Medicine

## 2018-05-27 ENCOUNTER — Other Ambulatory Visit: Payer: Self-pay | Admitting: Family Medicine

## 2018-05-28 ENCOUNTER — Other Ambulatory Visit (INDEPENDENT_AMBULATORY_CARE_PROVIDER_SITE_OTHER): Payer: Federal, State, Local not specified - PPO

## 2018-05-28 ENCOUNTER — Ambulatory Visit (INDEPENDENT_AMBULATORY_CARE_PROVIDER_SITE_OTHER): Payer: Federal, State, Local not specified - PPO

## 2018-05-28 DIAGNOSIS — M109 Gout, unspecified: Secondary | ICD-10-CM

## 2018-05-28 DIAGNOSIS — E538 Deficiency of other specified B group vitamins: Secondary | ICD-10-CM | POA: Diagnosis not present

## 2018-05-28 MED ORDER — CYANOCOBALAMIN 1000 MCG/ML IJ SOLN
1000.0000 ug | Freq: Once | INTRAMUSCULAR | Status: AC
  Administered 2018-05-28: 1000 ug via INTRAMUSCULAR

## 2018-05-28 NOTE — Progress Notes (Signed)
Pre visit review using our clinic tool,if applicable. No additional management support is needed unless otherwise documented below in the visit note.   Pt here for monthly B12 injection per order from Dr. Carollee Herter.  B12 1067mcg given IM left deltoid  and pt tolerated injection well.  No complaints voiced this visit.  Next B12 injection pre-scheduled for 1 month. Patient aware.

## 2018-05-29 LAB — BASIC METABOLIC PANEL
BUN: 14 mg/dL (ref 6–23)
CALCIUM: 9.6 mg/dL (ref 8.4–10.5)
CO2: 33 mEq/L — ABNORMAL HIGH (ref 19–32)
CREATININE: 0.88 mg/dL (ref 0.40–1.20)
Chloride: 101 mEq/L (ref 96–112)
GFR: 85.89 mL/min (ref >60.00)
Glucose, Bld: 117 mg/dL — ABNORMAL HIGH (ref 70–99)
Potassium: 3.7 mEq/L (ref 3.5–5.1)
Sodium: 141 mEq/L (ref 135–145)

## 2018-05-29 LAB — URIC ACID: URIC ACID, SERUM: 5.9 mg/dL (ref 2.4–7.0)

## 2018-06-04 ENCOUNTER — Ambulatory Visit: Payer: Self-pay

## 2018-06-10 ENCOUNTER — Telehealth: Payer: Self-pay | Admitting: *Deleted

## 2018-06-10 NOTE — Telephone Encounter (Signed)
Received paperwork back fro [employer] [facility]; they are needing a list of missing items; forwarded to provider/08/05

## 2018-06-15 ENCOUNTER — Other Ambulatory Visit (INDEPENDENT_AMBULATORY_CARE_PROVIDER_SITE_OTHER): Payer: Self-pay | Admitting: Orthopaedic Surgery

## 2018-07-02 ENCOUNTER — Ambulatory Visit (INDEPENDENT_AMBULATORY_CARE_PROVIDER_SITE_OTHER): Payer: Federal, State, Local not specified - PPO

## 2018-07-02 DIAGNOSIS — E538 Deficiency of other specified B group vitamins: Secondary | ICD-10-CM

## 2018-07-02 MED ORDER — CYANOCOBALAMIN 1000 MCG/ML IJ SOLN
1000.0000 ug | Freq: Once | INTRAMUSCULAR | Status: AC
Start: 2018-07-02 — End: 2018-07-02
  Administered 2018-07-02: 1000 ug via INTRAMUSCULAR

## 2018-07-02 NOTE — Progress Notes (Signed)
Pre visit review using our clinic tool,if applicable. No additional management support is needed unless otherwise documented below in the visit note.   Pt here for monthly B12 injection per order from Dr. Darreld Mclean [place].  B12 1052mcg given IM right deltoid., patient tolerated injection well.  No complaints voiced this visit.  Next B12 injection scheduled for July 30, 2018. Patient aware.

## 2018-07-30 ENCOUNTER — Ambulatory Visit: Payer: Federal, State, Local not specified - PPO

## 2018-08-01 ENCOUNTER — Other Ambulatory Visit: Payer: Self-pay | Admitting: Family Medicine

## 2018-08-06 ENCOUNTER — Ambulatory Visit (INDEPENDENT_AMBULATORY_CARE_PROVIDER_SITE_OTHER): Payer: Federal, State, Local not specified - PPO

## 2018-08-06 DIAGNOSIS — E538 Deficiency of other specified B group vitamins: Secondary | ICD-10-CM | POA: Diagnosis not present

## 2018-08-06 DIAGNOSIS — Z23 Encounter for immunization: Secondary | ICD-10-CM

## 2018-08-06 MED ORDER — CYANOCOBALAMIN 1000 MCG/ML IJ SOLN
1000.0000 ug | Freq: Once | INTRAMUSCULAR | Status: AC
  Administered 2018-08-06: 1000 ug via INTRAMUSCULAR

## 2018-08-06 NOTE — Progress Notes (Addendum)
Pre visit review using our clinic tool,if applicable. No additional management support is needed unless otherwise documented below in the visit note.   Pt here for monthly B12 injection per order from Dr.   [name].  B12 1071mcg given IM left deltoid, pt tolerated injection well. GiInfluenza vaccine in Right deltoid. No complaints voiced.  Next B12 injection scheduled for 1 month.  Reviewed  Ann Held, DO

## 2018-08-19 ENCOUNTER — Other Ambulatory Visit: Payer: Self-pay | Admitting: Family Medicine

## 2018-08-19 DIAGNOSIS — M10079 Idiopathic gout, unspecified ankle and foot: Secondary | ICD-10-CM

## 2018-08-24 ENCOUNTER — Other Ambulatory Visit (INDEPENDENT_AMBULATORY_CARE_PROVIDER_SITE_OTHER): Payer: Self-pay | Admitting: Orthopaedic Surgery

## 2018-08-26 NOTE — Telephone Encounter (Signed)
yes

## 2018-08-26 NOTE — Telephone Encounter (Signed)
Ok to rf? 

## 2018-09-10 ENCOUNTER — Ambulatory Visit (INDEPENDENT_AMBULATORY_CARE_PROVIDER_SITE_OTHER): Payer: Federal, State, Local not specified - PPO

## 2018-09-10 DIAGNOSIS — E538 Deficiency of other specified B group vitamins: Secondary | ICD-10-CM

## 2018-09-10 MED ORDER — CYANOCOBALAMIN 1000 MCG/ML IJ SOLN
1000.0000 ug | Freq: Once | INTRAMUSCULAR | Status: AC
Start: 2018-09-10 — End: 2018-09-10
  Administered 2018-09-10: 1000 ug via INTRAMUSCULAR

## 2018-09-10 NOTE — Progress Notes (Signed)
Pre visit review using our clinic tool,if applicable. No additional management support is needed unless otherwise documented below in the visit note.   Pt here for monthly B12 injection per order from Dr. Carollee Herter.   B12 1060mcg given IM right deltoid, and pt tolerated injection well.  Next B12 injection scheduled for 10/08/18. Patient aware.

## 2018-09-25 ENCOUNTER — Other Ambulatory Visit (INDEPENDENT_AMBULATORY_CARE_PROVIDER_SITE_OTHER): Payer: Self-pay | Admitting: Orthopaedic Surgery

## 2018-09-25 NOTE — Telephone Encounter (Signed)
yes

## 2018-09-30 ENCOUNTER — Other Ambulatory Visit: Payer: Self-pay | Admitting: Family Medicine

## 2018-09-30 DIAGNOSIS — I10 Essential (primary) hypertension: Secondary | ICD-10-CM

## 2018-10-08 ENCOUNTER — Ambulatory Visit (INDEPENDENT_AMBULATORY_CARE_PROVIDER_SITE_OTHER): Payer: Federal, State, Local not specified - PPO

## 2018-10-08 DIAGNOSIS — E538 Deficiency of other specified B group vitamins: Secondary | ICD-10-CM | POA: Diagnosis not present

## 2018-10-08 MED ORDER — CYANOCOBALAMIN 1000 MCG/ML IJ SOLN
1000.0000 ug | Freq: Once | INTRAMUSCULAR | Status: AC
  Administered 2018-10-08: 1000 ug via INTRAMUSCULAR

## 2018-10-14 ENCOUNTER — Other Ambulatory Visit (INDEPENDENT_AMBULATORY_CARE_PROVIDER_SITE_OTHER): Payer: Self-pay | Admitting: Orthopaedic Surgery

## 2018-10-15 MED ORDER — MELOXICAM 7.5 MG PO TABS: ORAL_TABLET | ORAL | 1 refills | Status: DC

## 2018-10-15 NOTE — Telephone Encounter (Signed)
Ok for refill? 

## 2018-10-15 NOTE — Telephone Encounter (Signed)
yes

## 2018-10-15 NOTE — Addendum Note (Signed)
Addended by: Meyer Cory on: 10/15/2018 10:34 AM   Modules accepted: Orders

## 2018-10-17 ENCOUNTER — Encounter: Payer: Self-pay | Admitting: Family Medicine

## 2018-10-17 ENCOUNTER — Ambulatory Visit (INDEPENDENT_AMBULATORY_CARE_PROVIDER_SITE_OTHER): Payer: Federal, State, Local not specified - PPO | Admitting: Family Medicine

## 2018-10-17 ENCOUNTER — Other Ambulatory Visit: Payer: Self-pay

## 2018-10-17 VITALS — BP 116/70 | Temp 97.7°F | Resp 16 | Ht 64.0 in | Wt 241.0 lb

## 2018-10-17 DIAGNOSIS — R1031 Right lower quadrant pain: Secondary | ICD-10-CM

## 2018-10-17 DIAGNOSIS — Z1231 Encounter for screening mammogram for malignant neoplasm of breast: Secondary | ICD-10-CM | POA: Diagnosis not present

## 2018-10-17 DIAGNOSIS — K6289 Other specified diseases of anus and rectum: Secondary | ICD-10-CM | POA: Diagnosis not present

## 2018-10-17 DIAGNOSIS — Z01419 Encounter for gynecological examination (general) (routine) without abnormal findings: Secondary | ICD-10-CM | POA: Diagnosis not present

## 2018-10-17 NOTE — Patient Instructions (Signed)

## 2018-10-17 NOTE — Progress Notes (Signed)
Patient ID: Rhonda Morrow, female    DOB: April 07, 1961  Age: 57 y.o. MRN: [id number]    Subjective:  Subjective  HPI Rhonda Morrow presents for rectal pain / pressure and diarrhea.  She also c/o low abd pain / pressure x few days.  Translator is present.    Review of Systems  Constitutional: Negative for appetite change, diaphoresis, fatigue and unexpected weight change.  Eyes: Negative for pain, redness and visual disturbance.  Respiratory: Negative for cough, chest tightness, shortness of breath and wheezing.   Cardiovascular: Negative for chest pain, palpitations and leg swelling.  Gastrointestinal: Positive for abdominal pain, diarrhea and rectal pain.  Endocrine: Negative for cold intolerance, heat intolerance, polydipsia, polyphagia and polyuria.  Genitourinary: Negative for difficulty urinating, dysuria and frequency.  Neurological: Negative for dizziness, light-headedness, numbness and headaches.    History Past Medical History:  Diagnosis Date  . Arthritis   . Asthma   . Complication of anesthesia    one time woke up and was vey scared,17 yrs ago  . Deaf   . Diabetes mellitus without complication ([place])   . GERD (gastroesophageal reflux disease)   . Hypertension   . Left groin pain   . Thyroid disease     She has a past surgical history that includes Cesarean section; Cardiac catheterization; Esophageal manometry (N/A, 09/29/2013); Cholecystectomy (N/A, 06/20/2016); and Total hip arthroplasty (Left, 02/21/2017).   Her family history includes Diabetes in her father and mother; Heart disease in her mother.She reports that she has never smoked. She has never used smokeless tobacco. She reports that she does not drink alcohol or use drugs.  Current Outpatient Medications on File Prior to Visit  Medication Sig Dispense Refill  . albuterol (PROVENTIL HFA;VENTOLIN HFA) 108 (90 Base) MCG/ACT inhaler Inhale 2 puffs into the lungs every 6 (six) hours as needed for wheezing. 1 Inhaler 5    . amLODipine (NORVASC) 5 MG tablet TAKE 1 TABLET BY MOUTH EVERY DAY 90 tablet 1  . azelastine (ASTELIN) 0.1 % nasal spray Place 2 sprays into both nostrils 2 (two) times daily. Use in each nostril as directed 30 mL 3  . colchicine 0.6 MG tablet TAKE 1 TABLET BY MOUTH EVERY DAY 30 tablet 3  . meloxicam (MOBIC) 7.5 MG tablet 1-2 po qd prn 60 tablet 1  . allopurinol (ZYLOPRIM) 100 MG tablet Take 2 tablets (200 mg total) by mouth daily. 60 tablet 2  . allopurinol (ZYLOPRIM) 100 MG tablet TAKE 1 TABLET BY MOUTH EVERY DAY 90 tablet 2  . aspirin EC 325 MG tablet Take 1 tablet (325 mg total) by mouth 2 (two) times daily. (Patient not taking: Reported on 10/17/2018) 84 tablet 0  . cetirizine (ZYRTEC) 10 MG tablet Take 1 tablet (10 mg total) by mouth daily. 30 tablet 5  . [facility] ASPIRIN 325 MG tablet TAKE 1 TABLET BY MOUTH TWICE A DAY (Patient not taking: Reported on 10/17/2018) 100 tablet 0  . Fiber CHEW Chew 1 tablet by mouth daily.    . fluticasone (FLONASE) 50 MCG/ACT nasal spray PLACE 2 SPRAYS INTO BOTH NOSTRILS DAILY. 16 g 4  . glucose blood test strip Use as instructed--- one touch verio test strips 100 each 12  . glucose blood test strip Use as instructed 100 each 12  . hydrochlorothiazide (HYDRODIURIL) 12.5 MG tablet TAKE 1 TABLET BY MOUTH EVERY DAY 90 tablet 1  . ipratropium-albuterol (DUONEB) 0.5-2.5 (3) MG/3ML SOLN Take 3 mLs by nebulization every 6 (six) hours as needed. 360 mL  3  . KLOR-CON M20 20 MEQ tablet TAKE 1 TABLET BY MOUTH EVERY DAY 90 tablet 1  . Lancets (ONETOUCH ULTRASOFT) lancets Use as instructed 100 each 12  . meclizine (ANTIVERT) 12.5 MG tablet Take 1 tablet (12.5 mg total) by mouth 3 (three) times daily as needed for dizziness. 30 tablet 0  . meloxicam (MOBIC) 7.5 MG tablet TAKE 2 TABLETS BY MOUTH DAILY AS NEEDED FOR PAIN. 180 tablet 1  . metaxalone (SKELAXIN) 800 MG tablet TAKE 1 TABLET BY MOUTH THREE TIMES A DAY 30 tablet 2  . metFORMIN (GLUCOPHAGE-XR) 500 MG 24 hr tablet  TAKE 1 TABLET BY MOUTH EVERY DAY WITH BREAKFAST 90 tablet 1  . mometasone-formoterol (DULERA) 100-5 MCG/ACT AERO Inhale 2 puffs into the lungs 2 (two) times daily. 1 Inhaler 5  . montelukast (SINGULAIR) 10 MG tablet Take 1 tablet (10 mg total) by mouth at bedtime. 30 tablet 5  . Multiple Vitamin (MULTIVITAMIN) tablet Take 1 tablet by mouth daily.    . NONFORMULARY OR COMPOUNDED ITEM Nebulizer   DX ASTHMA 1 each 0  . omeprazole (PRILOSEC) 20 MG capsule Take 1 capsule (20 mg total) by mouth daily. 30 capsule 2  . ondansetron (ZOFRAN) 4 MG tablet Take 1 tablet (4 mg total) by mouth every 8 (eight) hours as needed for nausea or vomiting. 10 tablet 0  . promethazine (PHENERGAN) 25 MG tablet Take 1 tablet (25 mg total) by mouth every 6 (six) hours as needed for nausea. 30 tablet 1  . ranitidine (ZANTAC) 150 MG tablet TAKE 1 TABLET BY MOUTH TWICE A DAY 180 tablet 1  . senna-docusate (SENOKOT S) 8.6-50 MG tablet Take 1 tablet by mouth at bedtime as needed. 30 tablet 1  . Spacer/Aero-Holding Chambers (AEROCHAMBER MV) inhaler Use as instructed 1 each 0  . traMADol (ULTRAM) 50 MG tablet Take 1 tablet (50 mg total) by mouth at bedtime as needed. 30 tablet 0  . zolpidem (AMBIEN) 10 MG tablet TAKE 1 TABLET BY MOUTH AT BEDTIME AS NEEDED FOR SLEEP 15 tablet 1   No current facility-administered medications on file prior to visit.      Objective:  Objective  Physical Exam Vitals signs and nursing note reviewed.  Constitutional:      Appearance: She is well-developed.  HENT:     Head: Normocephalic and atraumatic.  Eyes:     Conjunctiva/sclera: Conjunctivae normal.  Neck:     Musculoskeletal: Normal range of motion and neck supple.     Thyroid: No thyromegaly.     Vascular: No carotid bruit or JVD.  Cardiovascular:     Rate and Rhythm: Normal rate and regular rhythm.     Heart sounds: Normal heart sounds. No murmur.  Pulmonary:     Effort: Pulmonary effort is normal. No respiratory distress.      Breath sounds: Normal breath sounds. No wheezing or rales.  Chest:     Chest wall: No tenderness.  Abdominal:     General: There is no distension.     Palpations: There is no mass.     Tenderness: There is abdominal tenderness in the right lower quadrant. There is no guarding or rebound.     Hernia: No hernia is present.     Comments: Rectal -- heme neg Nero stool Discomfort with exam, no ext hemorrhoids  Neurological:     Mental Status: She is alert and oriented to person, place, and time.    BP 116/70 (BP Location: Right Arm, Patient Position: Sitting,  Cuff Size: Large)   Temp 97.7 F (36.5 C) (Oral)   Resp 16   Ht 5\' 4"  (1.626 m)   Wt 241 lb (109.3 kg)   LMP 05/20/2012   BMI 41.37 kg/m  Wt Readings from Last 3 Encounters:  10/17/18 241 lb (109.3 kg)  03/28/18 225 lb 6.4 oz (102.2 kg)  03/15/18 224 lb 3.2 oz (101.7 kg)     Lab Results  Component Value Date   WBC 5.9 10/17/2018   HGB 14.1 10/17/2018   HCT 43.4 10/17/2018   PLT 183.0 10/17/2018   GLUCOSE 121 (H) 10/17/2018   CHOL 156 03/28/2018   TRIG 71.0 03/28/2018   HDL 56.20 03/28/2018   LDLCALC 85 03/28/2018   ALT 26 10/17/2018   AST 23 10/17/2018   NA 142 10/17/2018   K 3.9 10/17/2018   CL 103 10/17/2018   CREATININE 0.94 10/17/2018   BUN 14 10/17/2018   CO2 31 10/17/2018   TSH 0.87 12/04/2016   INR 1.05 02/12/2017   HGBA1C 6.5 03/28/2018   MICROALBUR 0.8 10/27/2015    Dg Foot Complete Right  Result Date: 03/16/2018 CLINICAL DATA:  Pt with foot pain x 1 week; no known injury; major pain in right big toe EXAM: RIGHT FOOT COMPLETE - 3+ VIEW COMPARISON:  None. FINDINGS: There is no acute fracture or subluxation. Mild hallux valgus deformity is noted. No significant erosions. There is normal bone mineral density. Small plantar calcaneal spur is present. IMPRESSION: No evidence for acute  abnormality.  Mild hallux valgus deformity. Electronically Signed   By: Nolon Nations M.D.   On: 03/16/2018 16:13      Assessment & Plan:  Plan  I am having [name] maintain her multivitamin, Fiber, ipratropium-albuterol, NONFORMULARY OR COMPOUNDED ITEM, AEROCHAMBER MV, omeprazole, azelastine, fluticasone, glucose blood, ondansetron, aspirin EC, promethazine, senna-docusate, [facility] ASPIRIN, traMADol, albuterol, mometasone-formoterol, montelukast, zolpidem, metaxalone, cetirizine, onetouch ultrasoft, glucose blood, meclizine, ranitidine, meloxicam, amLODipine, allopurinol, metFORMIN, KLOR-CON M20, colchicine, allopurinol, hydrochlorothiazide, and meloxicam.  No orders of the defined types were placed in this encounter.   Problem List Items Addressed This Visit    None    Visit Diagnoses    RLQ abdominal pain    -  Primary   Relevant Orders   CT Abdomen Pelvis W Contrast (Completed)   CBC with Differential/Platelet (Completed)   Comprehensive metabolic panel (Completed)   Rectal pain       Relevant Orders   Ambulatory referral to Gastroenterology      Follow-up: Return if symptoms worsen or fail to improve.  Ann Held, DO

## 2018-10-18 ENCOUNTER — Ambulatory Visit (HOSPITAL_BASED_OUTPATIENT_CLINIC_OR_DEPARTMENT_OTHER)
Admission: RE | Admit: 2018-10-18 | Discharge: 2018-10-18 | Disposition: A | Discharge status: Home / Self Care | Payer: Federal, State, Local not specified - PPO | Source: Ambulatory Visit | Attending: Family Medicine | Admitting: Family Medicine

## 2018-10-18 DIAGNOSIS — R197 Diarrhea, unspecified: Secondary | ICD-10-CM | POA: Diagnosis not present

## 2018-10-18 DIAGNOSIS — R1031 Right lower quadrant pain: Secondary | ICD-10-CM

## 2018-10-18 LAB — COMPREHENSIVE METABOLIC PANEL
ALT: 26 U/L (ref 0–35)
AST: 23 U/L (ref 0–37)
Albumin: 3.8 g/dL (ref 3.5–5.2)
Alkaline Phosphatase: 81 U/L (ref 39–117)
BUN: 14 mg/dL (ref 6–23)
CHLORIDE: 103 meq/L (ref 96–112)
CO2: 31 meq/L (ref 19–32)
Calcium: 9.6 mg/dL (ref 8.4–10.5)
Creatinine, Ser: 0.94 mg/dL (ref 0.40–1.20)
GFR: 79.48 mL/min (ref >60.00)
GLUCOSE: 121 mg/dL — AB (ref 70–99)
POTASSIUM: 3.9 meq/L (ref 3.5–5.1)
SODIUM: 142 meq/L (ref 135–145)
Total Bilirubin: 0.4 mg/dL (ref 0.2–1.2)
Total Protein: 6.3 g/dL (ref 6.0–8.3)

## 2018-10-18 LAB — CBC WITH DIFFERENTIAL/PLATELET
BASOS PCT: 1 % (ref 0.0–3.0)
Basophils Absolute: 0.1 10*3/uL (ref 0.0–0.1)
EOS ABS: 0.2 10*3/uL (ref 0.0–0.7)
EOS PCT: 2.7 % (ref 0.0–5.0)
HCT: 43.4 % (ref 36.0–46.0)
Hemoglobin: 14.1 g/dL (ref 12.0–15.0)
LYMPHS ABS: 1.8 10*3/uL (ref 0.7–4.0)
Lymphocytes Relative: 31.3 % (ref 12.0–46.0)
MCHC: 32.5 g/dL (ref 30.0–36.0)
MCV: 85.5 fl (ref 78.0–100.0)
Monocytes Absolute: 0.5 10*3/uL (ref 0.1–1.0)
Monocytes Relative: 8.6 % (ref 3.0–12.0)
NEUTROS PCT: 56.4 % (ref 43.0–77.0)
Neutro Abs: 3.3 10*3/uL (ref 1.4–7.7)
PLATELETS: 183 10*3/uL (ref 150.0–400.0)
RBC: 5.08 Mil/uL (ref 3.87–5.11)
RDW: 14.5 % (ref 11.5–15.5)
WBC: 5.9 10*3/uL (ref 4.0–10.5)

## 2018-10-18 MED ORDER — IOPAMIDOL (ISOVUE-300) INJECTION 61%
100.0000 mL | Freq: Once | INTRAVENOUS | Status: AC | PRN
  Administered 2018-10-18: 100 mL via INTRAVENOUS

## 2018-10-20 DIAGNOSIS — R1031 Right lower quadrant pain: Secondary | ICD-10-CM | POA: Insufficient documentation

## 2018-10-20 DIAGNOSIS — K6289 Other specified diseases of anus and rectum: Secondary | ICD-10-CM | POA: Insufficient documentation

## 2018-10-20 NOTE — Assessment & Plan Note (Signed)
No ext abnormality Refer to GI

## 2018-10-20 NOTE — Assessment & Plan Note (Signed)
With diarrhea and rectal pain If pain worsens go to ER Check CT and labs

## 2018-11-01 ENCOUNTER — Encounter: Payer: Self-pay | Admitting: Gastroenterology

## 2018-11-08 ENCOUNTER — Encounter: Payer: Self-pay | Admitting: Family Medicine

## 2018-11-08 ENCOUNTER — Ambulatory Visit (INDEPENDENT_AMBULATORY_CARE_PROVIDER_SITE_OTHER): Payer: Federal, State, Local not specified - PPO | Admitting: Family Medicine

## 2018-11-08 ENCOUNTER — Ambulatory Visit (HOSPITAL_BASED_OUTPATIENT_CLINIC_OR_DEPARTMENT_OTHER)
Admission: RE | Admit: 2018-11-08 | Discharge: 2018-11-08 | Disposition: A | Discharge status: Home / Self Care | Payer: Federal, State, Local not specified - PPO | Source: Ambulatory Visit | Attending: Family Medicine | Admitting: Family Medicine

## 2018-11-08 VITALS — BP 138/88 | HR 79 | Temp 98.6°F | Resp 16 | Ht 64.0 in | Wt 239.4 lb

## 2018-11-08 DIAGNOSIS — N6489 Other specified disorders of breast: Secondary | ICD-10-CM | POA: Diagnosis not present

## 2018-11-08 DIAGNOSIS — M25511 Pain in right shoulder: Secondary | ICD-10-CM | POA: Diagnosis not present

## 2018-11-08 DIAGNOSIS — S4991XA Unspecified injury of right shoulder and upper arm, initial encounter: Secondary | ICD-10-CM | POA: Diagnosis not present

## 2018-11-08 DIAGNOSIS — R739 Hyperglycemia, unspecified: Secondary | ICD-10-CM

## 2018-11-08 LAB — BASIC METABOLIC PANEL
BUN: 14 mg/dL (ref 6–23)
CO2: 30 mEq/L (ref 19–32)
Calcium: 9.5 mg/dL (ref 8.4–10.5)
Chloride: 103 mEq/L (ref 96–112)
Creatinine, Ser: 0.79 mg/dL (ref 0.40–1.20)
GFR: 97.11 mL/min (ref >60.00)
Glucose, Bld: 88 mg/dL (ref 70–99)
Potassium: 3.8 mEq/L (ref 3.5–5.1)
Sodium: 142 mEq/L (ref 135–145)

## 2018-11-08 LAB — HEMOGLOBIN A1C: Hgb A1c MFr Bld: 6.4 % (ref 4.6–6.5)

## 2018-11-08 MED ORDER — MELOXICAM 7.5 MG PO TABS: ORAL_TABLET | ORAL | 1 refills | Status: DC

## 2018-11-08 NOTE — Progress Notes (Signed)
Patient ID: Rhonda Morrow, female    DOB: 1961/08/25  Age: 58 y.o. MRN: [id number]    Subjective:  Subjective  HPI Rhonda Morrow presents for R shoulder pain since Dec.  She lifted something at work  Review of Systems  Constitutional: Negative for appetite change, diaphoresis, fatigue and unexpected weight change.  Eyes: Negative for pain, redness and visual disturbance.  Respiratory: Negative for cough, chest tightness, shortness of breath and wheezing.   Cardiovascular: Negative for chest pain, palpitations and leg swelling.  Endocrine: Negative for cold intolerance, heat intolerance, polydipsia, polyphagia and polyuria.  Genitourinary: Negative for difficulty urinating, dysuria and frequency.  Musculoskeletal: Positive for arthralgias.  Neurological: Negative for dizziness, light-headedness, numbness and headaches.    History Past Medical History:  Diagnosis Date  . Arthritis   . Asthma   . Complication of anesthesia    one time woke up and was vey scared,17 yrs ago  . Deaf   . Diabetes mellitus without complication ([place])   . GERD (gastroesophageal reflux disease)   . Hypertension   . Left groin pain   . Thyroid disease     She has a past surgical history that includes Cesarean section; Cardiac catheterization; Esophageal manometry (N/A, 09/29/2013); Cholecystectomy (N/A, 06/20/2016); and Total hip arthroplasty (Left, 02/21/2017).   Her family history includes Diabetes in her father and mother; Heart disease in her mother.She reports that she has never smoked. She has never used smokeless tobacco. She reports that she does not drink alcohol or use drugs.  Current Outpatient Medications on File Prior to Visit  Medication Sig Dispense Refill  . albuterol (PROVENTIL HFA;VENTOLIN HFA) 108 (90 Base) MCG/ACT inhaler Inhale 2 puffs into the lungs every 6 (six) hours as needed for wheezing. 1 Inhaler 5  . allopurinol (ZYLOPRIM) 100 MG tablet Take 2 tablets (200 mg total) by mouth daily.  60 tablet 2  . allopurinol (ZYLOPRIM) 100 MG tablet TAKE 1 TABLET BY MOUTH EVERY DAY 90 tablet 2  . amLODipine (NORVASC) 5 MG tablet TAKE 1 TABLET BY MOUTH EVERY DAY 90 tablet 1  . aspirin EC 325 MG tablet Take 1 tablet (325 mg total) by mouth 2 (two) times daily. 84 tablet 0  . azelastine (ASTELIN) 0.1 % nasal spray Place 2 sprays into both nostrils 2 (two) times daily. Use in each nostril as directed 30 mL 3  . cetirizine (ZYRTEC) 10 MG tablet Take 1 tablet (10 mg total) by mouth daily. 30 tablet 5  . colchicine 0.6 MG tablet TAKE 1 TABLET BY MOUTH EVERY DAY 30 tablet 3  . [facility] ASPIRIN 325 MG tablet TAKE 1 TABLET BY MOUTH TWICE A DAY 100 tablet 0  . Fiber CHEW Chew 1 tablet by mouth daily.    . fluticasone (FLONASE) 50 MCG/ACT nasal spray PLACE 2 SPRAYS INTO BOTH NOSTRILS DAILY. 16 g 4  . glucose blood test strip Use as instructed--- one touch verio test strips 100 each 12  . glucose blood test strip Use as instructed 100 each 12  . hydrochlorothiazide (HYDRODIURIL) 12.5 MG tablet TAKE 1 TABLET BY MOUTH EVERY DAY 90 tablet 1  . ipratropium-albuterol (DUONEB) 0.5-2.5 (3) MG/3ML SOLN Take 3 mLs by nebulization every 6 (six) hours as needed. 360 mL 3  . KLOR-CON M20 20 MEQ tablet TAKE 1 TABLET BY MOUTH EVERY DAY 90 tablet 1  . Lancets (ONETOUCH ULTRASOFT) lancets Use as instructed 100 each 12  . meclizine (ANTIVERT) 12.5 MG tablet Take 1 tablet (12.5 mg total) by mouth  3 (three) times daily as needed for dizziness. 30 tablet 0  . meloxicam (MOBIC) 7.5 MG tablet 1-2 po qd prn 60 tablet 1  . metaxalone (SKELAXIN) 800 MG tablet TAKE 1 TABLET BY MOUTH THREE TIMES A DAY 30 tablet 2  . metFORMIN (GLUCOPHAGE-XR) 500 MG 24 hr tablet TAKE 1 TABLET BY MOUTH EVERY DAY WITH BREAKFAST 90 tablet 1  . mometasone-formoterol (DULERA) 100-5 MCG/ACT AERO Inhale 2 puffs into the lungs 2 (two) times daily. 1 Inhaler 5  . montelukast (SINGULAIR) 10 MG tablet Take 1 tablet (10 mg total) by mouth at bedtime. 30 tablet 5    . Multiple Vitamin (MULTIVITAMIN) tablet Take 1 tablet by mouth daily.    . NONFORMULARY OR COMPOUNDED ITEM Nebulizer   DX ASTHMA 1 each 0  . omeprazole (PRILOSEC) 20 MG capsule Take 1 capsule (20 mg total) by mouth daily. 30 capsule 2  . ondansetron (ZOFRAN) 4 MG tablet Take 1 tablet (4 mg total) by mouth every 8 (eight) hours as needed for nausea or vomiting. 10 tablet 0  . promethazine (PHENERGAN) 25 MG tablet Take 1 tablet (25 mg total) by mouth every 6 (six) hours as needed for nausea. 30 tablet 1  . ranitidine (ZANTAC) 150 MG tablet TAKE 1 TABLET BY MOUTH TWICE A DAY 180 tablet 1  . senna-docusate (SENOKOT S) 8.6-50 MG tablet Take 1 tablet by mouth at bedtime as needed. 30 tablet 1  . Spacer/Aero-Holding Chambers (AEROCHAMBER MV) inhaler Use as instructed 1 each 0  . traMADol (ULTRAM) 50 MG tablet Take 1 tablet (50 mg total) by mouth at bedtime as needed. 30 tablet 0  . zolpidem (AMBIEN) 10 MG tablet TAKE 1 TABLET BY MOUTH AT BEDTIME AS NEEDED FOR SLEEP 15 tablet 1   No current facility-administered medications on file prior to visit.      Objective:  Objective  Physical Exam Vitals signs and nursing note reviewed. Chaperone present: translator present.  Constitutional:      Appearance: She is well-developed.  HENT:     Head: Normocephalic and atraumatic.  Eyes:     Conjunctiva/sclera: Conjunctivae normal.  Neck:     Musculoskeletal: Normal range of motion and neck supple.     Thyroid: No thyromegaly.     Vascular: No carotid bruit or JVD.  Cardiovascular:     Rate and Rhythm: Normal rate and regular rhythm.     Heart sounds: Normal heart sounds. No murmur.  Pulmonary:     Effort: Pulmonary effort is normal. No respiratory distress.     Breath sounds: Normal breath sounds. No wheezing or rales.  Chest:     Chest wall: No tenderness.  Musculoskeletal:        General: Tenderness present.     Right shoulder: She exhibits decreased range of motion and tenderness.   Neurological:     Mental Status: She is alert and oriented to person, place, and time.    BP 138/88 (BP Location: Left Arm, Cuff Size: Large)   Pulse 79   Temp 98.6 F (37 C) (Oral)   Resp 16   Ht 5\' 4"  (1.626 m)   Wt 239 lb 6.4 oz (108.6 kg)   LMP 05/20/2012   SpO2 98%   BMI 41.09 kg/m  Wt Readings from Last 3 Encounters:  11/08/18 239 lb 6.4 oz (108.6 kg)  10/17/18 241 lb (109.3 kg)  03/28/18 225 lb 6.4 oz (102.2 kg)     Lab Results  Component Value Date  WBC 5.9 10/17/2018   HGB 14.1 10/17/2018   HCT 43.4 10/17/2018   PLT 183.0 10/17/2018   GLUCOSE 121 (H) 10/17/2018   CHOL 156 03/28/2018   TRIG 71.0 03/28/2018   HDL 56.20 03/28/2018   LDLCALC 85 03/28/2018   ALT 26 10/17/2018   AST 23 10/17/2018   NA 142 10/17/2018   K 3.9 10/17/2018   CL 103 10/17/2018   CREATININE 0.94 10/17/2018   BUN 14 10/17/2018   CO2 31 10/17/2018   TSH 0.87 12/04/2016   INR 1.05 02/12/2017   HGBA1C 6.5 03/28/2018   MICROALBUR 0.8 10/27/2015    Ct Abdomen Pelvis W Contrast  Result Date: 10/19/2018 CLINICAL DATA:  Diarrhea. EXAM: CT ABDOMEN AND PELVIS WITH CONTRAST TECHNIQUE: Multidetector CT imaging of the abdomen and pelvis was performed using the standard protocol following bolus administration of intravenous contrast. CONTRAST:  178mL ISOVUE-300 IOPAMIDOL (ISOVUE-300) INJECTION 61% COMPARISON:  CT scan November 16, 2017 FINDINGS: Lower chest: No acute abnormality. Hepatobiliary: Hepatic steatosis. No liver mass. Previous cholecystectomy. Patent portal vein. Pancreas: Unremarkable. No pancreatic ductal dilatation or surrounding inflammatory changes. Spleen: Normal in size without focal abnormality. Adrenals/Urinary Tract: Adrenal glands are unremarkable. Kidneys are normal, without renal calculi, focal lesion, or hydronephrosis. Bladder is unremarkable. Stomach/Bowel: The stomach and small bowel are normal. The colon is normal. The appendix is normal. Vascular/Lymphatic: No significant  vascular findings are present. No enlarged abdominal or pelvic lymph nodes. Reproductive: Uterus and bilateral adnexa are unremarkable. Other: There is a fat containing periumbilical hernia which is stable. Musculoskeletal: Previous left hip replacement. 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 IMPRESSION: 1. No cause for the patient's diarrhea identified. 2. Hepatic steatosis. 3. Fat containing periumbilical hernia.  This is stable. Electronically Signed   By: Dorise Bullion III M.D   On: 10/19/2018 09:50     Assessment & Plan:  Plan  I am having [name] maintain her multivitamin, Fiber, ipratropium-albuterol, NONFORMULARY OR COMPOUNDED ITEM, AEROCHAMBER MV, omeprazole, azelastine, fluticasone, glucose blood, ondansetron, aspirin EC, promethazine, senna-docusate, [facility] ASPIRIN, traMADol, albuterol, mometasone-formoterol, montelukast, zolpidem, metaxalone, cetirizine, onetouch ultrasoft, glucose blood, meclizine, ranitidine, meloxicam, amLODipine, allopurinol, metFORMIN, KLOR-CON M20, colchicine, allopurinol, hydrochlorothiazide, and meloxicam.  Meds ordered this encounter  Medications  . meloxicam (MOBIC) 7.5 MG tablet    Sig: TAKE 2 TABLETS BY MOUTH DAILY AS NEEDED FOR PAIN.    Dispense:  180 tablet    Refill:  1    Problem List Items Addressed This Visit    None    Visit Diagnoses    Acute pain of right shoulder    -  Primary   Relevant Medications   meloxicam (MOBIC) 7.5 MG tablet   Other Relevant Orders   DG Shoulder Right (Completed)   Pendulous breast       Relevant Orders   Ambulatory referral to Plastic Surgery   Hyperglycemia       Relevant Orders   Basic metabolic panel   Hemoglobin A1c      Follow-up: Return if symptoms worsen or fail to improve.  Ann Held, DO

## 2018-11-08 NOTE — Patient Instructions (Signed)

## 2018-11-12 ENCOUNTER — Ambulatory Visit (INDEPENDENT_AMBULATORY_CARE_PROVIDER_SITE_OTHER): Payer: Federal, State, Local not specified - PPO

## 2018-11-12 ENCOUNTER — Other Ambulatory Visit: Payer: Self-pay | Admitting: *Deleted

## 2018-11-12 ENCOUNTER — Telehealth: Payer: Self-pay | Admitting: Family Medicine

## 2018-11-12 DIAGNOSIS — M25511 Pain in right shoulder: Secondary | ICD-10-CM

## 2018-11-12 DIAGNOSIS — E538 Deficiency of other specified B group vitamins: Secondary | ICD-10-CM | POA: Diagnosis not present

## 2018-11-12 MED ORDER — CYANOCOBALAMIN 1000 MCG/ML IJ SOLN
1000.0000 ug | Freq: Once | INTRAMUSCULAR | Status: AC
  Administered 2018-11-12: 1000 ug via INTRAMUSCULAR

## 2018-11-12 NOTE — Telephone Encounter (Signed)
Pt came in office to see nurse but also wanted to inform provider that she is still with pain and that from her specialist that they told her if she continues with pain will need surgery. Pt is continuing to have in process for surgery. Just want FYI.

## 2018-11-12 NOTE — Progress Notes (Signed)
Pre visit review using our clinic tool,if applicable. No additional management support is needed unless otherwise documented below in the visit note. b12  .Pt here for monthly B12 injection per Dr. Carollee Herter.  B12 1093mcg given IMleft deltoid, and pt tolerated injection well.  Next B12 injection scheduled for Februart 4, 2020.

## 2018-11-13 ENCOUNTER — Other Ambulatory Visit: Payer: Self-pay | Admitting: Family Medicine

## 2018-11-13 DIAGNOSIS — Z889 Allergy status to unspecified drugs, medicaments and biological substances status: Secondary | ICD-10-CM

## 2018-11-17 ENCOUNTER — Emergency Department (HOSPITAL_COMMUNITY): Payer: Federal, State, Local not specified - PPO

## 2018-11-17 ENCOUNTER — Emergency Department (HOSPITAL_COMMUNITY)
Admission: EM | Admit: 2018-11-17 | Discharge: 2018-11-17 | Disposition: A | Discharge status: Home / Self Care | Payer: Federal, State, Local not specified - PPO | Attending: Emergency Medicine | Admitting: Emergency Medicine

## 2018-11-17 ENCOUNTER — Other Ambulatory Visit: Payer: Self-pay

## 2018-11-17 DIAGNOSIS — J45909 Unspecified asthma, uncomplicated: Secondary | ICD-10-CM | POA: Insufficient documentation

## 2018-11-17 DIAGNOSIS — R1013 Epigastric pain: Secondary | ICD-10-CM | POA: Diagnosis not present

## 2018-11-17 DIAGNOSIS — E119 Type 2 diabetes mellitus without complications: Secondary | ICD-10-CM | POA: Diagnosis not present

## 2018-11-17 DIAGNOSIS — R52 Pain, unspecified: Secondary | ICD-10-CM | POA: Diagnosis not present

## 2018-11-17 DIAGNOSIS — R112 Nausea with vomiting, unspecified: Secondary | ICD-10-CM | POA: Diagnosis not present

## 2018-11-17 DIAGNOSIS — Z7982 Long term (current) use of aspirin: Secondary | ICD-10-CM | POA: Insufficient documentation

## 2018-11-17 DIAGNOSIS — E785 Hyperlipidemia, unspecified: Secondary | ICD-10-CM | POA: Insufficient documentation

## 2018-11-17 DIAGNOSIS — R000 Tachycardia, unspecified: Secondary | ICD-10-CM | POA: Diagnosis not present

## 2018-11-17 DIAGNOSIS — R1084 Generalized abdominal pain: Secondary | ICD-10-CM | POA: Diagnosis not present

## 2018-11-17 DIAGNOSIS — Z7984 Long term (current) use of oral hypoglycemic drugs: Secondary | ICD-10-CM | POA: Insufficient documentation

## 2018-11-17 DIAGNOSIS — I10 Essential (primary) hypertension: Secondary | ICD-10-CM | POA: Insufficient documentation

## 2018-11-17 DIAGNOSIS — R1110 Vomiting, unspecified: Secondary | ICD-10-CM | POA: Diagnosis not present

## 2018-11-17 DIAGNOSIS — Z79899 Other long term (current) drug therapy: Secondary | ICD-10-CM | POA: Diagnosis not present

## 2018-11-17 DIAGNOSIS — I491 Atrial premature depolarization: Secondary | ICD-10-CM | POA: Diagnosis not present

## 2018-11-17 LAB — CBC WITH DIFFERENTIAL/PLATELET
Abs Immature Granulocytes: 0.03 10*3/uL (ref 0.00–0.07)
Basophils Absolute: 0 10*3/uL (ref 0.0–0.1)
Basophils Relative: 0 %
Eosinophils Absolute: 0 10*3/uL (ref 0.0–0.5)
Eosinophils Relative: 0 %
HEMATOCRIT: 50.2 % — AB (ref 36.0–46.0)
Hemoglobin: 15.7 g/dL — ABNORMAL HIGH (ref 12.0–15.0)
Immature Granulocytes: 0 %
LYMPHS ABS: 0.4 10*3/uL — AB (ref 0.7–4.0)
Lymphocytes Relative: 4 %
MCH: 26.9 pg (ref 26.0–34.0)
MCHC: 31.3 g/dL (ref 30.0–36.0)
MCV: 86 fL (ref 80.0–100.0)
MONO ABS: 0.6 10*3/uL (ref 0.1–1.0)
MONOS PCT: 6 %
Neutro Abs: 8.5 10*3/uL — ABNORMAL HIGH (ref 1.7–7.7)
Neutrophils Relative %: 90 %
Platelets: 178 10*3/uL (ref 150–400)
RBC: 5.84 MIL/uL — ABNORMAL HIGH (ref 3.87–5.11)
RDW: 13.1 % (ref 11.5–15.5)
WBC: 9.5 10*3/uL (ref 4.0–10.5)
nRBC: 0 % (ref 0.0–0.2)

## 2018-11-17 LAB — COMPREHENSIVE METABOLIC PANEL
ALT: 33 U/L (ref 0–44)
AST: 39 U/L (ref 15–41)
Albumin: 3.7 g/dL (ref 3.5–5.0)
Alkaline Phosphatase: 61 U/L (ref 38–126)
Anion gap: 9 (ref 5–15)
BUN: 16 mg/dL (ref 6–20)
CO2: 26 mmol/L (ref 22–32)
Calcium: 9.2 mg/dL (ref 8.9–10.3)
Chloride: 105 mmol/L (ref 98–111)
Creatinine, Ser: 0.97 mg/dL (ref 0.44–1.00)
GFR calc Af Amer: >60 mL/min (ref >60)
GFR calc non Af Amer: >60 mL/min (ref >60)
Glucose, Bld: 130 mg/dL — ABNORMAL HIGH (ref 70–99)
Potassium: 4 mmol/L (ref 3.5–5.1)
Sodium: 140 mmol/L (ref 135–145)
Total Bilirubin: 1 mg/dL (ref 0.3–1.2)
Total Protein: 7.2 g/dL (ref 6.5–8.1)

## 2018-11-17 LAB — URINALYSIS, ROUTINE W REFLEX MICROSCOPIC
Bilirubin Urine: NEGATIVE
Glucose, UA: NEGATIVE mg/dL
Hgb urine dipstick: NEGATIVE
Ketones, ur: NEGATIVE mg/dL
Leukocytes, UA: NEGATIVE
Nitrite: NEGATIVE
PROTEIN: NEGATIVE mg/dL
Specific Gravity, Urine: 1.02 (ref 1.005–1.030)
pH: 5 (ref 5.0–8.0)

## 2018-11-17 LAB — LIPASE, BLOOD: LIPASE: 24 U/L (ref 11–51)

## 2018-11-17 MED ORDER — ONDANSETRON 4 MG PO TBDP: 4.0000 mg | ORAL_TABLET | Freq: Three times a day (TID) | ORAL | 0 refills | Status: DC | PRN

## 2018-11-17 MED ORDER — ONDANSETRON HCL 4 MG/2ML IJ SOLN
4.0000 mg | Freq: Once | INTRAMUSCULAR | Status: AC
  Administered 2018-11-17: 4 mg via INTRAVENOUS
  Filled 2018-11-17: qty 2

## 2018-11-17 MED ORDER — SODIUM CHLORIDE 0.9 % IV BOLUS
1000.0000 mL | Freq: Once | INTRAVENOUS | Status: AC
  Administered 2018-11-17: 1000 mL via INTRAVENOUS

## 2018-11-17 NOTE — ED Provider Notes (Signed)
[facility] EMERGENCY DEPARTMENT Provider Note   CSN: [id number] Arrival date & time: 11/17/18  1107     History   Chief Complaint Chief Complaint  Patient presents with  . Emesis  . Abdominal Pain    HPI Rhonda Morrow is a 58 y.o. female.  Patient with history of deafness, previous cholecystectomy presents the emergency department today with complaint of vomiting and epigastric pain.  Symptoms started about 4 AM and lasted until about 9 AM.  Yesterday the patient felt generally "lousy" and had to leave work early.  Abdominal pain is in the epigastric area.  It does not radiate.  She has had some hard stools.  No urinary symptoms.  She reports several sick contacts at home who have recently had an illness with vomiting, including her son and mother.  No treatments prior to arrival.  Patient had a CT of her abdomen and pelvis last month which was negative.     Past Medical History:  Diagnosis Date  . Arthritis   . Asthma   . Complication of anesthesia    one time woke up and was vey scared,17 yrs ago  . Deaf   . Diabetes mellitus without complication ([place])   . GERD (gastroesophageal reflux disease)   . Hypertension   . Left groin pain   . Thyroid disease     Patient Active Problem List   Diagnosis Date Noted  . RLQ abdominal pain 10/20/2018  . Rectal pain 10/20/2018  . Vitamin B 12 deficiency 03/28/2018  . B12 deficiency 03/28/2018  . DM (diabetes mellitus) type II uncontrolled, periph vascular disorder ([place]) 03/28/2018  . Hyperlipidemia 03/28/2018  . Primary osteoarthritis of left hip 02/21/2017  . 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 History of laparoscopic cholecystectomy 06/20/2016  . Cerumen impaction 06/09/2016  . Hypersomnia 05/16/2016  . Knee pain, right 05/05/2016  . RUQ pain 04/09/2016  . Abnormal CT scan 03/28/2016  . Dyspnea and respiratory abnormalities 03/15/2016  . Asthma with acute exacerbation 03/15/2016  .  Asthma in adult 02/25/2016  . DM (diabetes mellitus) type II controlled, neurological manifestation ([place]) 02/09/2016  . Left hip pain 12/23/2015  . Right hamstring muscle strain 11/18/2015  . Abdominal pain, acute 09/01/2015  . Acute asthma exacerbation 04/16/2015  . Acute bronchitis 04/14/2015  . Vaginal discharge 09/14/2014  . Pap smear for cervical cancer screening 09/14/2014  . Scabies 05/06/2014  . Bed bug bite 05/06/2014  . Allergic rhinitis 04/09/2014  . Rectal itching 04/09/2014  . Bladder spasm 04/09/2014  . Knee pain, left 03/05/2014  . Hypokalemia 02/19/2014  . Nausea with vomiting 02/19/2014  . Diabetes mellitus, type II ([place]) 02/19/2014  . Edema 02/16/2014  . Disorder of rotator cuff 11/12/2013  . Routine general medical examination at a health care facility 08/20/2013  . GERD (gastroesophageal reflux disease) 06/26/2013  . Dysphagia, pharyngoesophageal phase 06/26/2013  . Suprapubic pain 05/12/2013  . Medication side effect 03/25/2013  . Diarrhea 03/25/2013  . Benign positional vertigo 01/30/2013  . Traumatic hematoma of thigh 01/15/2013  . HTN (hypertension) 09/02/2012  . Dry skin 08/22/2012  . External hemorrhoids 08/22/2012  . Obesity 06/30/2012  . Thyromegaly 05/22/2012  . Back pain 05/22/2012  . Elevated glucose 05/22/2012  . Moderate persistent asthma 04/19/2012  . Dyspnea 01/28/2012    Past Surgical History:  Procedure Laterality Date  . CARDIAC CATHETERIZATION    . CESAREAN SECTION    . CHOLECYSTECTOMY N/A 06/20/2016   Procedure: LAPAROSCOPIC CHOLECYSTECTOMY;  Surgeon: Rolm Bookbinder, MD;  Location: [facility];  Service: General;  Laterality: N/A;  . ESOPHAGEAL MANOMETRY N/A 09/29/2013   Procedure: ESOPHAGEAL MANOMETRY (EM);  Surgeon: Milus Banister, MD;  Location: [facility];  Service: Endoscopy;  Laterality: N/A;  . TOTAL HIP ARTHROPLASTY Left 02/21/2017   Procedure: LEFT TOTAL HIP ARTHROPLASTY ANTERIOR APPROACH;  Surgeon: Leandrew Koyanagi, MD;   Location: [facility];  Service: Orthopedics;  Laterality: Left;     OB History    Gravida  3   Para      Term      Preterm      AB      Living  3     SAB      TAB      Ectopic      Multiple      Live Births               Home Medications    Prior to Admission medications   Medication Sig Start Date End Date Taking? Authorizing Provider  albuterol (PROVENTIL HFA;VENTOLIN HFA) 108 (90 Base) MCG/ACT inhaler Inhale 2 puffs into the lungs every 6 (six) hours as needed for wheezing. 06/25/17  Yes Parrett, Tammy S, NP  allopurinol (ZYLOPRIM) 100 MG tablet TAKE 1 TABLET BY MOUTH EVERY DAY Patient taking differently: Take 100 mg by mouth daily.  08/19/18  Yes [name] R, DO  amLODipine (NORVASC) 5 MG tablet TAKE 1 TABLET BY MOUTH EVERY DAY Patient taking differently: Take 5 mg by mouth daily.  03/29/18  Yes Ann Held, DO  aspirin EC 325 MG tablet Take 1 tablet (325 mg total) by mouth 2 (two) times daily. 02/21/17  Yes Leandrew Koyanagi, MD  azelastine (ASTELIN) 0.1 % nasal spray Place 2 sprays into both nostrils 2 (two) times daily. Use in each nostril as directed 09/01/16  Yes Lowne Chase, Yvonne R, DO  cetirizine (ZYRTEC) 10 MG tablet TAKE 1 TABLET BY MOUTH EVERY DAY Patient taking differently: Take 10 mg by mouth daily. No Therapeutic Substitution 11/14/18  Yes Lowne Lyndal Pulley R, DO  colchicine 0.6 MG tablet TAKE 1 TABLET BY MOUTH EVERY DAY Patient taking differently: Take 0.6 mg by mouth daily.  08/01/18  Yes [name] R, DO  Fiber CHEW Chew 1 tablet by mouth daily.   Yes [provider]  fluticasone (FLONASE) 50 MCG/ACT nasal spray PLACE 2 SPRAYS INTO BOTH NOSTRILS DAILY. Patient taking differently: Place 2 sprays into both nostrils daily.  09/01/16  Yes [name] R, DO  hydrochlorothiazide (HYDRODIURIL) 12.5 MG tablet TAKE 1 TABLET BY MOUTH EVERY DAY Patient taking differently: Take 25 mg by mouth daily.  10/01/18  Yes [name] R, DO  ipratropium-albuterol (DUONEB) 0.5-2.5 (3) MG/3ML SOLN Take 3 mLs by nebulization every 6 (six) hours as needed. 02/25/16  Yes Lowne Chase, Yvonne R, DO  KLOR-CON M20 20 MEQ tablet TAKE 1 TABLET BY MOUTH EVERY DAY Patient taking differently: Take 20 mEq by mouth daily.  05/27/18  Yes Ann Held, DO  meclizine (ANTIVERT) 12.5 MG tablet Take 1 tablet (12.5 mg total) by mouth 3 (three) times daily as needed for dizziness. 12/19/17  Yes Saguier, Percell Miller, PA-C  meloxicam (MOBIC) 7.5 MG tablet TAKE 2 TABLETS BY MOUTH DAILY AS NEEDED FOR PAIN. Patient taking differently: Take 7.5-15 mg by mouth as needed. FOR PAIN. 11/08/18  Yes Carollee Herter, Alferd Apa, DO  metaxalone (SKELAXIN) 800 MG tablet TAKE  1 TABLET BY MOUTH THREE TIMES A DAY Patient taking differently: Take 800 mg by mouth 3 (three) times daily.  10/03/17  Yes [name] R, DO  metFORMIN (GLUCOPHAGE-XR) 500 MG 24 hr tablet TAKE 1 TABLET BY MOUTH EVERY DAY WITH BREAKFAST Patient taking differently: Take 500 mg by mouth daily with breakfast.  04/30/18  Yes Lowne Chase, Yvonne R, DO  mometasone-formoterol (DULERA) 100-5 MCG/ACT AERO Inhale 2 puffs into the lungs 2 (two) times daily. 06/25/17  Yes Parrett, Tammy S, NP  montelukast (SINGULAIR) 10 MG tablet Take 1 tablet (10 mg total) by mouth at bedtime. 06/25/17  Yes Parrett, Tammy S, NP  Multiple Vitamin (MULTIVITAMIN) tablet Take 1 tablet by mouth daily.   Yes [provider]  promethazine (PHENERGAN) 25 MG tablet Take 1 tablet (25 mg total) by mouth every 6 (six) hours as needed for nausea. 02/21/17  Yes Leandrew Koyanagi, MD  ranitidine (ZANTAC) 150 MG tablet TAKE 1 TABLET BY MOUTH TWICE A DAY Patient taking differently: Take 150 mg by mouth 2 (two) times daily.  12/31/17  Yes [name] R, DO  senna-docusate (SENOKOT S) 8.6-50 MG tablet Take 1 tablet by mouth at bedtime as needed. 02/21/17  Yes Leandrew Koyanagi, MD  zolpidem (AMBIEN) 10 MG tablet TAKE 1  TABLET BY MOUTH AT BEDTIME AS NEEDED FOR SLEEP Patient taking differently: Take 10 mg by mouth at bedtime.  08/10/17  Yes [name] R, DO  glucose blood test strip Use as instructed--- one touch verio test strips 10/16/16   Carollee Herter, Spencer R, DO  glucose blood test strip Use as instructed 12/19/17   Saguier, Percell Miller, PA-C  Lancets [facility] ULTRASOFT) lancets Use as instructed 12/19/17   Saguier, Percell Miller, PA-C  NONFORMULARY OR COMPOUNDED ITEM Nebulizer   DX ASTHMA 02/25/16   Carollee Herter, Alferd Apa, DO  ondansetron (ZOFRAN ODT) 4 MG disintegrating tablet Take 1 tablet (4 mg total) by mouth every 8 (eight) hours as needed for nausea or vomiting. 11/17/18   Carlisle Cater, PA-C  Spacer/Aero-Holding Chambers (AEROCHAMBER MV) inhaler Use as instructed 03/28/16   Parrett, Fonnie Mu, NP  traMADol (ULTRAM) 50 MG tablet Take 1 tablet (50 mg total) by mouth at bedtime as needed. Patient not taking: Reported on 11/17/2018 04/30/17   Ann Held, DO    Family History Family History  Problem Relation Age of Onset  . Diabetes Mother   . Heart disease Mother   . Diabetes Father     Social History Social History   Tobacco Use  . Smoking status: Never Smoker  . Smokeless tobacco: Never Used  Substance Use Topics  . Alcohol use: No    Alcohol/week: 0.0 standard drinks  . Drug use: No     Allergies   Losartan; Oxycodone-acetaminophen; and Augmentin [amoxicillin-pot clavulanate]   Review of Systems Review of Systems  Constitutional: Negative for fever.  HENT: Negative for rhinorrhea and sore throat.   Eyes: Negative for redness.  Respiratory: Negative for cough.   Cardiovascular: Negative for chest pain.  Gastrointestinal: Positive for abdominal pain, nausea and vomiting. Negative for constipation and diarrhea.  Genitourinary: Negative for dysuria.  Musculoskeletal: Negative for myalgias.  Skin: Negative for rash.  Neurological: Negative for headaches.     Physical  Exam Updated Vital Signs BP (!) 159/46   Pulse (!) 109   Temp 98.2 F (36.8 C) (Oral)   Resp (!) 25   Ht 5\' 4"  (1.626 m)   Wt  108.6 kg   LMP 05/20/2012   SpO2 100%   BMI 41.10 kg/m   Physical Exam Vitals signs and nursing note reviewed.  Constitutional:      Appearance: She is well-developed.  HENT:     Head: Normocephalic and atraumatic.  Eyes:     General:        Right eye: No discharge.        Left eye: No discharge.     Conjunctiva/sclera: Conjunctivae normal.  Neck:     Musculoskeletal: Normal range of motion and neck supple.  Cardiovascular:     Rate and Rhythm: Regular rhythm. Tachycardia present.     Heart sounds: Normal heart sounds.     Comments: Mild tachycardia Pulmonary:     Effort: Pulmonary effort is normal.     Breath sounds: Normal breath sounds.  Abdominal:     Palpations: Abdomen is soft.     Tenderness: There is abdominal tenderness in the epigastric area. There is no guarding or rebound.  Skin:    General: Skin is warm and dry.  Neurological:     Mental Status: She is alert.      ED Treatments / Results  Labs (all labs ordered are listed, but only abnormal results are displayed) Labs Reviewed  CBC WITH DIFFERENTIAL/PLATELET - Abnormal; Notable for the following components:      Result Value   RBC 5.84 (*)    Hemoglobin 15.7 (*)    HCT 50.2 (*)    Neutro Abs 8.5 (*)    Lymphs Abs 0.4 (*)    All other components within normal limits  COMPREHENSIVE METABOLIC PANEL - Abnormal; Notable for the following components:   Glucose, Bld 130 (*)    All other components within normal limits  URINALYSIS, ROUTINE W REFLEX MICROSCOPIC - Abnormal; Notable for the following components:   APPearance HAZY (*)    All other components within normal limits  LIPASE, BLOOD    EKG None  Radiology Dg Abd 2 Views  Result Date: 11/17/2018 CLINICAL DATA:  Vomiting and abdominal pain since 4 a.m. EXAM: ABDOMEN - 2 VIEW COMPARISON:  CT of the abdomen and  pelvis 10/18/2018 FINDINGS: The bowel gas pattern is normal. There is no evidence of free air. No radio-opaque calculi or other significant radiographic abnormality is seen. Surgical clips are present at the gallbladder fossa. Left hip arthroplasty is noted. IMPRESSION: Acute abnormality. Electronically Signed   By: San Morelle M.D.   On: 11/17/2018 13:45    Procedures Procedures (including critical care time)  Medications Ordered in ED Medications  sodium chloride 0.9 % bolus 1,000 mL (0 mLs Intravenous Stopped 11/17/18 1503)  ondansetron (ZOFRAN) injection 4 mg (4 mg Intravenous Given 11/17/18 1239)     Initial Impression / Assessment and Plan / ED Course  I have reviewed the triage vital signs and the nursing notes.  Pertinent labs & imaging results that were available during my care of the patient were reviewed by me and considered in my medical decision making (see chart for details).     Patient seen and examined. Work-up initiated. Medications ordered.   Vital signs reviewed and are as follows: BP (!) 159/46   Pulse (!) 109   Temp 98.2 F (36.8 C) (Oral)   Resp (!) 25   Ht 5\' 4"  (1.626 m)   Wt 108.6 kg   LMP 05/20/2012   SpO2 100%   BMI 41.10 kg/m   Labs suggestive of dehydration. Pending UA. No signs  of obstruction on x-ray.   3:35 PM patient reexamined.  She states that "things are calming down".  Discussed lab results using ASL interpreter.   She has received IV fluids.  Pulse rate in the low 100s.  She appears comfortable.  Plan is to discharged home with Zofran for nausea.  I encouraged good fluid intake and bland foods over the next 24 hours.   The patient was urged to return to the Emergency Department immediately with worsening of current symptoms, worsening abdominal pain, persistent vomiting, blood noted in stools, fever, or any other concerns. The patient verbalized understanding.    Final Clinical Impressions(s) / ED Diagnoses   Final diagnoses:   Non-intractable vomiting with nausea, unspecified vomiting type   Patient with abdominal pain, N/V, mainly epigastric pain with + sick contacts. Vitals are stable, no fever. Labs reassuring, suggests dehydration. Mild hyperglycemia without DKA.  Imaging plain film without obstruction. Patient is tolerating PO's. Lungs are clear and no signs suggestive of PNA. Low concern for appendicitis, cholecystitis, pancreatitis, ruptured viscus, UTI, kidney stone, aortic dissection, aortic aneurysm or other emergent abdominal etiology. Supportive therapy indicated with return if symptoms worsen.    ED Discharge Orders         Ordered    ondansetron (ZOFRAN ODT) 4 MG disintegrating tablet  Every 8 hours PRN     11/17/18 1537           Carlisle Cater, PA-C 11/17/18 [place], [place], DO 11/17/18 1605

## 2018-11-17 NOTE — Discharge Instructions (Signed)
Please read and follow all provided instructions.  Your diagnoses today include:  1. Non-intractable vomiting with nausea, unspecified vomiting type     Tests performed today include:  Blood counts and electrolytes  Blood tests to check liver and kidney function  Blood tests to check pancreas function  Urine test to look for infection   X-ray does not show any signs of blockage  Vital signs. See below for your results today.   Medications prescribed:   Zofran (ondansetron) - for nausea and vomiting  Take any prescribed medications only as directed.  Home care instructions:   Follow any educational materials contained in this packet.   Your abdominal pain, nausea, vomiting may be caused by a viral gastroenteritis also called 'stomach flu'. You should rest for the next several days. Keep drinking plenty of fluids and use the medicine for nausea as directed.    Drink clear liquids for the next 24 hours and introduce solid foods slowly after 24 hours using the b.r.a.t. diet (Bananas, Rice, Applesauce, Toast, Yogurt).    Follow-up instructions: Please follow-up with your primary care provider in the next 2 days for further evaluation of your symptoms. If you are not feeling better in 48 hours you may have a condition that is more serious and you need re-evaluation.   Return instructions:  SEEK IMMEDIATE MEDICAL ATTENTION IF:  If you have pain that does not go away or becomes severe   A temperature above 101F develops   Repeated vomiting occurs (multiple episodes)   If you have pain that becomes localized to portions of the abdomen. The right side could possibly be appendicitis. In an adult, the left lower portion of the abdomen could be colitis or diverticulitis.   Blood is being passed in stools or vomit (bright red or black tarry stools)   You develop chest pain, difficulty breathing, dizziness or fainting, or become confused, poorly responsive, or inconsolable (young  children)  If you have any other emergent concerns regarding your health  Additional Information: Abdominal (belly) pain can be caused by many things. Your caregiver performed an examination and possibly ordered blood/urine tests and imaging (CT scan, x-rays, ultrasound). Many cases can be observed and treated at home after initial evaluation in the emergency department. Even though you are being discharged home, abdominal pain can be unpredictable. Therefore, you need a repeated exam if your pain does not resolve, returns, or worsens. Most patients with abdominal pain don't have to be admitted to the hospital or have surgery, but serious problems like appendicitis and gallbladder attacks can start out as nonspecific pain. Many abdominal conditions cannot be diagnosed in one visit, so follow-up evaluations are very important.  Your vital signs today were: BP 125/69    Pulse (!) 106    Temp 98.2 F (36.8 C) (Oral)    Resp (!) 26    Ht 5\' 4"  (1.626 m)    Wt 108.6 kg    LMP 05/20/2012    SpO2 96%    BMI 41.10 kg/m  If your blood pressure (bp) was elevated above 135/85 this visit, please have this repeated by your doctor within one month. --------------

## 2018-11-17 NOTE — ED Notes (Signed)
Called for a deaf interpreter

## 2018-11-17 NOTE — ED Triage Notes (Signed)
Pt here from home via [facility] for vomiting and abdominal pain since 4 am.  Stomach bug has been going around home.  ST of 107 with some bigeminy, cbg 151, bp 152/100, rr 20, 96% RA.

## 2018-11-17 NOTE — ED Notes (Signed)
Pt ambulated to BR to get urine sample but was unable to collect sample, as sample missed cup.

## 2018-11-18 ENCOUNTER — Ambulatory Visit (INDEPENDENT_AMBULATORY_CARE_PROVIDER_SITE_OTHER): Payer: Federal, State, Local not specified - PPO | Admitting: Family Medicine

## 2018-11-19 ENCOUNTER — Encounter: Payer: Self-pay | Admitting: *Deleted

## 2018-11-19 ENCOUNTER — Encounter (INDEPENDENT_AMBULATORY_CARE_PROVIDER_SITE_OTHER): Payer: Self-pay | Admitting: Family Medicine

## 2018-11-19 ENCOUNTER — Ambulatory Visit (INDEPENDENT_AMBULATORY_CARE_PROVIDER_SITE_OTHER): Payer: Federal, State, Local not specified - PPO | Admitting: Family Medicine

## 2018-11-19 DIAGNOSIS — M19011 Primary osteoarthritis, right shoulder: Secondary | ICD-10-CM

## 2018-11-19 DIAGNOSIS — M25511 Pain in right shoulder: Secondary | ICD-10-CM | POA: Diagnosis not present

## 2018-11-19 MED ORDER — DICLOFENAC SODIUM 1 % TD GEL: 4.0000 g | Freq: Four times a day (QID) | TRANSDERMAL | 6 refills | Status: DC | PRN

## 2018-11-19 MED ORDER — TRAMADOL HCL 50 MG PO TABS: 50.0000 mg | ORAL_TABLET | Freq: Every evening | ORAL | 0 refills | Status: DC | PRN

## 2018-11-19 MED ORDER — DICLOFENAC SODIUM 75 MG PO TBEC: 75.0000 mg | DELAYED_RELEASE_TABLET | Freq: Two times a day (BID) | ORAL | 3 refills | Status: DC | PRN

## 2018-11-19 NOTE — Progress Notes (Signed)
I saw and examined the patient with Dr. Okey Dupre and agree with assessment and plan as outlined.  Right AC arthritis causing pain.  Try new meds and PT.  AC injection if persists.

## 2018-11-19 NOTE — Progress Notes (Signed)
  Rhonda Morrow - 58 y.o. female MRN [id number]  Date of birth: 02-16-1961    SUBJECTIVE:      Chief Complaint:/ HPI:  58 year old female presents with 3 weeks of right shoulder pain.  Sign language interpreter present for encounter.  Patient reports pain on the superior aspect of the shoulder.  She denies any specific injury.  States she woke up and noticed the shoulder pain.  It radiates into the front of her shoulder and toward the base of her neck.  She saw her PCP for this who ordered x-rays and start her on meloxicam.  Initially meloxicam was helpful but has since been less effective.  Her pain is worse with lying on the right side.  No associated erythema or bruising.  She does note decreased range of motion with abduction flexion.  No distal numbness or tingling.  She does report history of bilateral shoulder pain in the past which responded well to steroid injections   ROS:     See HPI  PERTINENT  PMH / PSH FH / / SH:  Past Medical, Surgical, Social, and Family History Reviewed & Updated in the EMR.   OBJECTIVE: LMP 05/20/2012   Physical Exam:  Vital signs are reviewed.  GEN: Alert and oriented, NAD Pulm: Breathing unlabored PSY: normal mood, congruent affect  MSK: Right shoulder: No obvious deformity or asymmetry. No bruising. No swelling Focal tenderness over the [facility] joint.  Mild tenderness over the proximal biceps Normal range of motion and internal and external rotation.  Limited to approximately 110 degrees in abduction and flexion NV intact distally Special Tests:  - Impingement: Pain with [place].  - Supraspinatus: Negative empty can but she does report some discomfort.  5/5 strength - Infraspinatus/Teres: 5/5 strength with ER - Subscapularis:. 5/5 strength with IR - Biceps tendon: Negative Speeds. Charna Archer Joint: Positive cross arm  Left shoulder: No obvious deformity Full range of motion with 5/5 strength with rotator cuff testing N/V intact  distally   ASSESSMENT & PLAN:  1.  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  Today, her symptoms seem to be more overwhelmingly related to her [facility] joint.  Previous x-rays were independently reviewed today which arthritis AC joint with spurring inferiorly which does not predispose her to impingement.  She also has evidence of some mild glenohumeral degenerative changes. - Diclofenac 75 mg twice daily -Voltaren gel -We will refill her tramadol for use at night -Discussed steroid injections which she deferred at this time -Physical therapy -Follow-up 4- 6 weeks as needed

## 2018-11-21 ENCOUNTER — Ambulatory Visit (INDEPENDENT_AMBULATORY_CARE_PROVIDER_SITE_OTHER): Payer: Federal, State, Local not specified - PPO | Admitting: Family Medicine

## 2018-11-21 ENCOUNTER — Encounter: Payer: Self-pay | Admitting: Family Medicine

## 2018-11-21 DIAGNOSIS — J4531 Mild persistent asthma with (acute) exacerbation: Secondary | ICD-10-CM | POA: Diagnosis not present

## 2018-11-21 DIAGNOSIS — J301 Allergic rhinitis due to pollen: Secondary | ICD-10-CM

## 2018-11-21 MED ORDER — IPRATROPIUM-ALBUTEROL 0.5-2.5 (3) MG/3ML IN SOLN: 3.0000 mL | Freq: Four times a day (QID) | RESPIRATORY_TRACT | Status: DC

## 2018-11-21 MED ORDER — MONTELUKAST SODIUM 10 MG PO TABS: 10.0000 mg | ORAL_TABLET | Freq: Every day | ORAL | 3 refills | Status: DC

## 2018-11-21 MED ORDER — IPRATROPIUM-ALBUTEROL 0.5-2.5 (3) MG/3ML IN SOLN: 3.0000 mL | Freq: Four times a day (QID) | RESPIRATORY_TRACT | 3 refills | Status: DC | PRN

## 2018-11-21 MED ORDER — MOMETASONE FURO-FORMOTEROL FUM 100-5 MCG/ACT IN AERO: 2.0000 | INHALATION_SPRAY | Freq: Two times a day (BID) | RESPIRATORY_TRACT | 99 refills | Status: DC

## 2018-11-21 MED ORDER — FLUTICASONE PROPIONATE 50 MCG/ACT NA SUSP: NASAL | 4 refills | Status: DC

## 2018-11-21 NOTE — Patient Instructions (Signed)
Asthma, Adult    Asthma is a long-term (chronic) condition that causes recurrent episodes in which the airways become tight and narrow. The airways are the passages that lead from the nose and mouth down into the lungs. Asthma episodes, also called asthma attacks, can cause coughing, wheezing, shortness of breath, and chest pain. The airways can also fill with mucus. During an attack, it can be difficult to breathe. Asthma attacks can range from minor to life threatening.  Asthma cannot be cured, but medicines and lifestyle changes can help control it and treat acute attacks.  What are the causes?  This condition is believed to be caused by inherited (genetic) and environmental factors, but its exact cause is not known.  There are many things that can bring on an asthma attack or make asthma symptoms worse (triggers). Asthma triggers are different for each person. Common triggers include:   Mold.   Dust.   Cigarette smoke.   Cockroaches.   Things that can cause allergy symptoms (allergens), such as animal dander or pollen from trees or grass.   Air pollutants such as household cleaners, wood smoke, smog, or chemical odors.   Cold air, weather changes, and winds (which increase molds and pollen in the air).   Strong emotional expressions such as crying or laughing hard.   Stress.   Certain medicines (such as aspirin) or types of medicines (such as beta-blockers).   Sulfites in foods and drinks. Foods and drinks that may contain sulfites include dried fruit, potato chips, and sparkling grape juice.   Infections or inflammatory conditions such as the flu, a cold, or inflammation of the nasal membranes (rhinitis).   Gastroesophageal reflux disease (GERD).   Exercise or strenuous activity.  What are the signs or symptoms?  Symptoms of this condition may occur right after asthma is triggered or many hours later. Symptoms include:   Wheezing. This can sound like whistling when you breathe.   Excessive  nighttime or early morning coughing.   Frequent or severe coughing with a common cold.   Chest tightness.   Shortness of breath.   Tiredness (fatigue) with minimal activity.  How is this diagnosed?  This condition is diagnosed based on:   Your medical history.   A physical exam.   Tests, which may include:  ? Lung function studies and pulmonary studies (spirometry). These tests can evaluate the flow of air in your lungs.  ? Allergy tests.  ? Imaging tests, such as X-rays.  How is this treated?  There is no cure for this condition, but treatment can help control your symptoms. Treatment for asthma usually involves:   Identifying and avoiding your asthma triggers.   Using medicines to control your symptoms. Generally, two types of medicines are used to treat asthma:  ? Controller medicines. These help prevent asthma symptoms from occurring. They are usually taken every day.  ? Fast-acting reliever or rescue medicines. These quickly relieve asthma symptoms by widening the narrow and tight airways. They are used as needed and provide short-term relief.   Using supplemental oxygen. This may be needed during a severe episode.   Using other medicines, such as:  ? Allergy medicines, such as antihistamines, if your asthma attacks are triggered by allergens.  ? Immune medicines (immunomodulators). These are medicines that help control the immune system.   Creating an asthma action plan. An asthma action plan is a written plan for managing and treating your asthma attacks. This plan includes:  ? A list   of your asthma triggers and how to avoid them.  ? Information about when medicines should be taken and when their dosage should be changed.  ? Instructions about using a device called a peak flow meter. A peak flow meter measures how well the lungs are working and the severity of your asthma. It helps you monitor your condition.  Follow these instructions at home:  Controlling your home environment  Control your home  environment in the following ways to help avoid triggers and prevent asthma attacks:   Change your heating and air conditioning filter regularly.   Limit your use of fireplaces and wood stoves.   Get rid of pests (such as roaches and mice) and their droppings.   Throw away plants if you see mold on them.   Clean floors and dust surfaces regularly. Use unscented cleaning products.   Try to have someone else vacuum for you regularly. Stay out of rooms while they are being vacuumed and for a short while afterward. If you vacuum, use a dust mask from a hardware store, a double-layered or microfilter vacuum cleaner bag, or a vacuum cleaner with a HEPA filter.   Replace carpet with wood, tile, or vinyl flooring. Carpet can trap dander and dust.   Use allergy-proof pillows, mattress covers, and box spring covers.   Keep your bedroom a trigger-free room.   Avoid pets and keep windows closed when allergens are in the air.   Wash beddings every week in hot water and dry them in a dryer.   Use blankets that are made of polyester or cotton.   Clean bathrooms and kitchens with bleach. If possible, have someone repaint the walls in these rooms with mold-resistant paint. Stay out of the rooms that are being cleaned and painted.   Wash your hands often with soap and water. If soap and water are not available, use hand sanitizer.   Do not allow anyone to smoke in your home.  General instructions   Take over-the-counter and prescription medicines only as told by your health care provider.  ? Speak with your health care provider if you have questions about how or when to take the medicines.  ? Make note if you are requiring more frequent dosages.   Do not use any products that contain nicotine or tobacco, such as cigarettes and e-cigarettes. If you need help quitting, ask your health care provider. Also, avoid being exposed to secondhand smoke.   Use a peak flow meter as told by your health care provider. Record and  keep track of the readings.   Understand and use the asthma action plan to help minimize, or stop an asthma attack, without needing to seek medical care.   Make sure you stay up to date on your yearly vaccinations as told by your health care provider. This may include vaccines for the flu and pneumonia.   Avoid outdoor activities when allergen counts are high and when air quality is low.   Wear a ski mask that covers your nose and mouth during outdoor winter activities. Exercise indoors on cold days if you can.   Warm up before exercising, and take time for a cool-down period after exercise.   Keep all follow-up visits as told by your health care provider. This is important.  Where to find more information   For information about asthma, turn to the Centers for Disease Control and Prevention at www.cdc.gov/asthma/faqs.htm   For air quality information, turn to AirNow at https://airnow.gov/  Contact   a health care provider if:   You have wheezing, shortness of breath, or a cough even while you are taking medicine to prevent attacks.   The mucus you cough up (sputum) is thicker than usual.   Your sputum changes from clear or white to yellow, green, gray, or bloody.   Your medicines are causing side effects, such as a rash, itching, swelling, or trouble breathing.   You need to use a reliever medicine more than 2-3 times a week.   Your peak flow reading is still at 50-79% of your personal best after following your action plan for 1 hour.   You have a fever.  Get help right away if:   You are getting worse and do not respond to treatment during an asthma attack.   You are short of breath when at rest or when doing very little physical activity.   You have difficulty eating, drinking, or talking.   You have chest pain or tightness.   You develop a fast heartbeat or palpitations.   You have a bluish color to your lips or fingernails.   You are light-headed or dizzy, or you faint.   Your peak flow  reading is less than 50% of your personal best.   You feel too tired to breathe normally.  Summary   Asthma is a long-term (chronic) condition that causes recurrent episodes in which the airways become tight and narrow. These episodes can cause coughing, wheezing, shortness of breath, and chest pain.   Asthma cannot be cured, but medicines and lifestyle changes can help control it and treat acute attacks.   Make sure you understand how to avoid triggers and how and when to use your medicines.   Asthma attacks can range from minor to life threatening. Get help right away if you have an asthma attack and do not respond to treatment with your usual rescue medicines.  This information is not intended to replace advice given to you by your health care provider. Make sure you discuss any questions you have with your health care provider.  Document Released: 10/23/2005 Document Revised: 11/27/2016 Document Reviewed: 11/27/2016  Elsevier Interactive Patient Education  2019 [employer]

## 2018-11-21 NOTE — Telephone Encounter (Signed)
Dr. Etter Sjogren will talk with patient at [facility].

## 2018-11-21 NOTE — Progress Notes (Signed)
New Patient Office Visit  Subjective:  Patient ID: Rhonda Morrow, female    DOB: 24-Sep-1961  Age: 58 y.o. MRN: [id number]  CC:  Chief Complaint  Patient presents with  . Hospitalization Follow-up    Patient is here today for a hospital F/U.  She was seen at [facility] on 1.12.20 for N&V.  She was given IV fluids and D/C'ed to home wiht Zofran and instructed to increase fluids and have a bland diet for 24 hours and F/U with PCP in 3d.  She is feeling better but not 100%.  Next week has OV with Pulmonary due to breathing problems when walking.  I advised pt to take Dulara and Singulair daily and she agreed.    HPI Desirre Eickhoff presents for f/u ER-- for gastroenteritis which has resolved.   She is c/o asthma exacerbation .  Deaf   Translator is present.  Pt admitted to not taking the singulair or dulera regularly-- she did not realize she was supposed to take that daily.  She will also make a f/u with her pulmonologist .   No fevers.  No more NV.  No productive cough.    Past Medical History:  Diagnosis Date  . Arthritis   . Asthma   . Complication of anesthesia    one time woke up and was vey scared,17 yrs ago  . Deaf   . Diabetes mellitus without complication ([place])   . GERD (gastroesophageal reflux disease)   . Hypertension   . Left groin pain   . Thyroid disease     Past Surgical History:  Procedure Laterality Date  . CARDIAC CATHETERIZATION    . CESAREAN SECTION    . CHOLECYSTECTOMY N/A 06/20/2016   Procedure: LAPAROSCOPIC CHOLECYSTECTOMY;  Surgeon: Rolm Bookbinder, MD;  Location: [facility];  Service: General;  Laterality: N/A;  . ESOPHAGEAL MANOMETRY N/A 09/29/2013   Procedure: ESOPHAGEAL MANOMETRY (EM);  Surgeon: Milus Banister, MD;  Location: [facility];  Service: Endoscopy;  Laterality: N/A;  . TOTAL HIP ARTHROPLASTY Left 02/21/2017   Procedure: LEFT TOTAL HIP ARTHROPLASTY ANTERIOR APPROACH;  Surgeon: Leandrew Koyanagi, MD;  Location: [facility];  Service: Orthopedics;  Laterality: Left;     Family History  Problem Relation Age of Onset  . Diabetes Mother   . Heart disease Mother   . Diabetes Father     Social History   Socioeconomic History  . Marital status: Married    Spouse name: Not on file  . Number of children: Not on file  . Years of education: Not on file  . Highest education level: Not on file  Occupational History  . Occupation: disabled  Social Needs  . Financial resource strain: Not on file  . Food insecurity:    Worry: Not on file    Inability: Not on file  . Transportation needs:    Medical: Not on file    Non-medical: Not on file  Tobacco Use  . Smoking status: Never Smoker  . Smokeless tobacco: Never Used  Substance and Sexual Activity  . Alcohol use: No    Alcohol/week: 0.0 standard drinks  . Drug use: No  . Sexual activity: Never  Lifestyle  . Physical activity:    Days per week: Not on file    Minutes per session: Not on file  . Stress: Not on file  Relationships  . Social connections:    Talks on phone: Not on file    Gets together: Not on file  Attends religious service: Not on file    Active member of club or organization: Not on file    Attends meetings of clubs or organizations: Not on file    Relationship status: Not on file  . Intimate partner violence:    Fear of current or ex partner: Not on file    Emotionally abused: Not on file    Physically abused: Not on file    Forced sexual activity: Not on file  Other Topics Concern  . Not on file  Social History Narrative   Lives with family.  Has 3 children.  Works for [employer].  Education: high school.    ROS Review of Systems  Constitutional: Positive for fatigue. Negative for chills and fever.  HENT: Negative for congestion, postnasal drip, rhinorrhea and sinus pressure.   Respiratory: Positive for cough, chest tightness, shortness of breath and wheezing.   Cardiovascular: Negative for chest pain, palpitations and leg swelling.  Allergic/Immunologic: Negative  for environmental allergies.    Objective:   Today's Vitals: BP (!) 110/58 (BP Location: Left Arm, Patient Position: Sitting, Cuff Size: Large)   Pulse 82   Temp 97.6 F (36.4 C) (Oral)   Ht 5\' 4"  (1.626 m)   Wt 241 lb 6.4 oz (109.5 kg)   LMP 05/20/2012   SpO2 97%   BMI 41.44 kg/m   Physical Exam Vitals signs and nursing note reviewed.  Constitutional:      Appearance: She is well-developed.  HENT:     Right Ear: External ear normal.     Left Ear: External ear normal.  Eyes:     General:        Right eye: No discharge.        Left eye: No discharge.     Conjunctiva/sclera: Conjunctivae normal.  Cardiovascular:     Rate and Rhythm: Normal rate and regular rhythm.     Heart sounds: Normal heart sounds. No murmur.  Pulmonary:     Effort: Pulmonary effort is normal. No respiratory distress.     Breath sounds: Decreased air movement present. Decreased breath sounds present. No wheezing or rales.    Chest:     Chest wall: No tenderness.  Lymphadenopathy:     Cervical: No cervical adenopathy.  Neurological:     Mental Status: She is alert and oriented to person, place, and time.     Assessment & Plan:   Problem List Items Addressed This Visit      Unprioritized   Asthma with acute exacerbation    Went over meds with pt and translator  Pt was not taking meds appropriately --- pt states she understands now how to take her meds Refills sent to pharmacy       Relevant Medications   mometasone-formoterol (DULERA) 100-5 MCG/ACT AERO   montelukast (SINGULAIR) 10 MG tablet   ipratropium-albuterol (DUONEB) 0.5-2.5 (3) MG/3ML SOLN   ipratropium-albuterol (DUONEB) 0.5-2.5 (3) MG/3ML nebulizer solution 3 mL    Other Visit Diagnoses    Chronic seasonal allergic rhinitis due to pollen       Relevant Medications   fluticasone (FLONASE) 50 MCG/ACT nasal spray      Outpatient Encounter Medications as of 11/21/2018  Medication Sig  . albuterol (PROVENTIL HFA;VENTOLIN HFA)  108 (90 Base) MCG/ACT inhaler Inhale 2 puffs into the lungs every 6 (six) hours as needed for wheezing.  Marland Kitchen allopurinol (ZYLOPRIM) 100 MG tablet TAKE 1 TABLET BY MOUTH EVERY DAY (Patient taking differently: Take 100 mg by mouth  daily. )  . amLODipine (NORVASC) 5 MG tablet TAKE 1 TABLET BY MOUTH EVERY DAY (Patient taking differently: Take 5 mg by mouth daily. )  . aspirin EC 325 MG tablet Take 1 tablet (325 mg total) by mouth 2 (two) times daily.  Marland Kitchen azelastine (ASTELIN) 0.1 % nasal spray Place 2 sprays into both nostrils 2 (two) times daily. Use in each nostril as directed  . cetirizine (ZYRTEC) 10 MG tablet TAKE 1 TABLET BY MOUTH EVERY DAY (Patient taking differently: Take 10 mg by mouth daily. No Therapeutic Substitution)  . colchicine 0.6 MG tablet TAKE 1 TABLET BY MOUTH EVERY DAY (Patient taking differently: Take 0.6 mg by mouth daily. )  . diclofenac (VOLTAREN) 75 MG EC tablet Take 1 tablet (75 mg total) by mouth 2 (two) times daily as needed.  . diclofenac sodium (VOLTAREN) 1 % GEL Apply 4 g topically 4 (four) times daily as needed.  . Fiber CHEW Chew 1 tablet by mouth daily.  . fluticasone (FLONASE) 50 MCG/ACT nasal spray PLACE 2 SPRAYS INTO BOTH NOSTRILS DAILY.  Marland Kitchen glucose blood test strip Use as instructed--- one touch verio test strips  . hydrochlorothiazide (HYDRODIURIL) 12.5 MG tablet TAKE 1 TABLET BY MOUTH EVERY DAY (Patient taking differently: Take 25 mg by mouth daily. )  . ipratropium-albuterol (DUONEB) 0.5-2.5 (3) MG/3ML SOLN Take 3 mLs by nebulization every 6 (six) hours as needed.  Marland Kitchen KLOR-CON M20 20 MEQ tablet TAKE 1 TABLET BY MOUTH EVERY DAY (Patient taking differently: Take 20 mEq by mouth daily. )  . Lancets (ONETOUCH ULTRASOFT) lancets Use as instructed  . meclizine (ANTIVERT) 12.5 MG tablet Take 1 tablet (12.5 mg total) by mouth 3 (three) times daily as needed for dizziness.  . meloxicam (MOBIC) 7.5 MG tablet TAKE 2 TABLETS BY MOUTH DAILY AS NEEDED FOR PAIN. (Patient taking  differently: Take 7.5-15 mg by mouth as needed. FOR PAIN.)  . metaxalone (SKELAXIN) 800 MG tablet TAKE 1 TABLET BY MOUTH THREE TIMES A DAY (Patient taking differently: Take 800 mg by mouth 3 (three) times daily. )  . metFORMIN (GLUCOPHAGE-XR) 500 MG 24 hr tablet TAKE 1 TABLET BY MOUTH EVERY DAY WITH BREAKFAST (Patient taking differently: Take 500 mg by mouth daily with breakfast. )  . mometasone-formoterol (DULERA) 100-5 MCG/ACT AERO Inhale 2 puffs into the lungs 2 (two) times daily.  . montelukast (SINGULAIR) 10 MG tablet Take 1 tablet (10 mg total) by mouth at bedtime.  . Multiple Vitamin (MULTIVITAMIN) tablet Take 1 tablet by mouth daily.  . NONFORMULARY OR COMPOUNDED ITEM Nebulizer   DX ASTHMA  . ondansetron (ZOFRAN ODT) 4 MG disintegrating tablet Take 1 tablet (4 mg total) by mouth every 8 (eight) hours as needed for nausea or vomiting.  . ranitidine (ZANTAC) 150 MG tablet TAKE 1 TABLET BY MOUTH TWICE A DAY (Patient taking differently: Take 150 mg by mouth 2 (two) times daily. )  . Spacer/Aero-Holding Chambers (AEROCHAMBER MV) inhaler Use as instructed  . traMADol (ULTRAM) 50 MG tablet Take 1-2 tablets (50-100 mg total) by mouth at bedtime as needed.  . zolpidem (AMBIEN) 10 MG tablet TAKE 1 TABLET BY MOUTH AT BEDTIME AS NEEDED FOR SLEEP  . [DISCONTINUED] fluticasone (FLONASE) 50 MCG/ACT nasal spray PLACE 2 SPRAYS INTO BOTH NOSTRILS DAILY. (Patient taking differently: Place 2 sprays into both nostrils daily. )  . [DISCONTINUED] ipratropium-albuterol (DUONEB) 0.5-2.5 (3) MG/3ML SOLN Take 3 mLs by nebulization every 6 (six) hours as needed.  . [DISCONTINUED] mometasone-formoterol (DULERA) 100-5 MCG/ACT AERO Inhale  2 puffs into the lungs 2 (two) times daily.  . [DISCONTINUED] montelukast (SINGULAIR) 10 MG tablet Take 1 tablet (10 mg total) by mouth at bedtime.  . [DISCONTINUED] glucose blood test strip Use as instructed  . [DISCONTINUED] traMADol (ULTRAM) 50 MG tablet Take 1 tablet (50 mg total) by  mouth at bedtime as needed. (Patient not taking: Reported on 11/17/2018)   Facility-Administered Encounter Medications as of 11/21/2018  Medication  . ipratropium-albuterol (DUONEB) 0.5-2.5 (3) MG/3ML nebulizer solution 3 mL    Follow-up: Return if symptoms worsen or fail to improve.   Ann Held, DO

## 2018-11-24 NOTE — Assessment & Plan Note (Signed)
Went over meds with pt and translator  Pt was not taking meds appropriately --- pt states she understands now how to take her meds Refills sent to pharmacy

## 2018-11-26 ENCOUNTER — Ambulatory Visit (INDEPENDENT_AMBULATORY_CARE_PROVIDER_SITE_OTHER): Payer: Federal, State, Local not specified - PPO | Admitting: Pulmonary Disease

## 2018-11-26 ENCOUNTER — Encounter: Payer: Self-pay | Admitting: Pulmonary Disease

## 2018-11-26 VITALS — BP 122/80 | HR 70 | Ht 63.0 in | Wt 240.4 lb

## 2018-11-26 DIAGNOSIS — J4540 Moderate persistent asthma, uncomplicated: Secondary | ICD-10-CM

## 2018-11-26 DIAGNOSIS — Z6841 Body Mass Index (BMI) 40.0 and over, adult: Secondary | ICD-10-CM | POA: Diagnosis not present

## 2018-11-26 DIAGNOSIS — R0602 Shortness of breath: Secondary | ICD-10-CM | POA: Diagnosis not present

## 2018-11-26 DIAGNOSIS — R6889 Other general symptoms and signs: Secondary | ICD-10-CM | POA: Diagnosis not present

## 2018-11-26 MED ORDER — AEROCHAMBER MV MISC: 0 refills | Status: DC

## 2018-11-26 MED ORDER — BUDESONIDE-FORMOTEROL FUMARATE 160-4.5 MCG/ACT IN AERO: 2.0000 | INHALATION_SPRAY | Freq: Two times a day (BID) | RESPIRATORY_TRACT | 0 refills | Status: DC

## 2018-11-26 NOTE — Progress Notes (Signed)
Patient seen in the office today and instructed on use of Symbicort.  Patient expressed understanding and demonstrated technique. ° °

## 2018-11-26 NOTE — Progress Notes (Signed)
Synopsis: Referred in January 2020 as a self-referral for shortness of breath patient's PCP TK:PTWSF Koren Shiver, *  Subjective:   PATIENT ID: Rhonda Morrow GENDER: female DOB: 1961/04/09, MRN: [id number]  Chief Complaint  Patient presents with  . Consult    Consult for SOB. States she was seen in EDd recently for SOB and vomiting. States she was told she had the flu.     This is a 58 year old that was recently seen in the emergency room for shortness of breath as well as vomiting.  She was diagnosed with a flulike illness and otherwise has been doing well since then.  She has a diagnosis of asthma since childhood.  She currently works at the [employer].  She has noticed increased shortness of breath with exertion.  She has been using her albuterol nebulizer as needed.  She does state that her husband put 3 vials of albuterol in her nebulizer which made her very tremulous.  She has been using Dulera but finds the inhaler expensive.  She does think that her symptoms from her asthma are okay controlled but have flared up since her recent illness.  She is slowly getting better.  Her shortness of breath only present with significant exertion.  Patient denies chest pain, nausea vomiting, diarrhea, hemoptysis.   Past Medical History:  Diagnosis Date  . Arthritis   . Asthma   . Complication of anesthesia    one time woke up and was vey scared,17 yrs ago  . Deaf   . Diabetes mellitus without complication ([place])   . GERD (gastroesophageal reflux disease)   . Hypertension   . Left groin pain   . Thyroid disease      Family History  Problem Relation Age of Onset  . Diabetes Mother   . Heart disease Mother   . Diabetes Father      Past Surgical History:  Procedure Laterality Date  . CARDIAC CATHETERIZATION    . CESAREAN SECTION    . CHOLECYSTECTOMY N/A 06/20/2016   Procedure: LAPAROSCOPIC CHOLECYSTECTOMY;  Surgeon: Rolm Bookbinder, MD;  Location: [facility];  Service: General;   Laterality: N/A;  . ESOPHAGEAL MANOMETRY N/A 09/29/2013   Procedure: ESOPHAGEAL MANOMETRY (EM);  Surgeon: Milus Banister, MD;  Location: [facility];  Service: Endoscopy;  Laterality: N/A;  . TOTAL HIP ARTHROPLASTY Left 02/21/2017   Procedure: LEFT TOTAL HIP ARTHROPLASTY ANTERIOR APPROACH;  Surgeon: Leandrew Koyanagi, MD;  Location: [facility];  Service: Orthopedics;  Laterality: Left;    Social History   Socioeconomic History  . Marital status: Married    Spouse name: Not on file  . Number of children: Not on file  . Years of education: Not on file  . Highest education level: Not on file  Occupational History  . Occupation: disabled  Social Needs  . Financial resource strain: Not on file  . Food insecurity:    Worry: Not on file    Inability: Not on file  . Transportation needs:    Medical: Not on file    Non-medical: Not on file  Tobacco Use  . Smoking status: Never Smoker  . Smokeless tobacco: Never Used  Substance and Sexual Activity  . Alcohol use: No    Alcohol/week: 0.0 standard drinks  . Drug use: No  . Sexual activity: Never  Lifestyle  . Physical activity:    Days per week: Not on file    Minutes per session: Not on file  . Stress: Not on  file  Relationships  . Social connections:    Talks on phone: Not on file    Gets together: Not on file    Attends religious service: Not on file    Active member of club or organization: Not on file    Attends meetings of clubs or organizations: Not on file    Relationship status: Not on file  . Intimate partner violence:    Fear of current or ex partner: Not on file    Emotionally abused: Not on file    Physically abused: Not on file    Forced sexual activity: Not on file  Other Topics Concern  . Not on file  Social History Narrative   Lives with family.  Has 3 children.  Works for [employer].  Education: high school.     Allergies  Allergen Reactions  . Losartan Shortness Of Breath  . Oxycodone-Acetaminophen Nausea And  Vomiting  . Augmentin [Amoxicillin-Pot Clavulanate] Diarrhea     Outpatient Medications Prior to Visit  Medication Sig Dispense Refill  . albuterol (PROVENTIL HFA;VENTOLIN HFA) 108 (90 Base) MCG/ACT inhaler Inhale 2 puffs into the lungs every 6 (six) hours as needed for wheezing. 1 Inhaler 5  . allopurinol (ZYLOPRIM) 100 MG tablet TAKE 1 TABLET BY MOUTH EVERY DAY (Patient taking differently: Take 100 mg by mouth daily. ) 90 tablet 2  . amLODipine (NORVASC) 5 MG tablet TAKE 1 TABLET BY MOUTH EVERY DAY (Patient taking differently: Take 5 mg by mouth daily. ) 90 tablet 1  . aspirin EC 325 MG tablet Take 1 tablet (325 mg total) by mouth 2 (two) times daily. 84 tablet 0  . azelastine (ASTELIN) 0.1 % nasal spray Place 2 sprays into both nostrils 2 (two) times daily. Use in each nostril as directed 30 mL 3  . cetirizine (ZYRTEC) 10 MG tablet TAKE 1 TABLET BY MOUTH EVERY DAY (Patient taking differently: Take 10 mg by mouth daily. No Therapeutic Substitution) 30 tablet 5  . colchicine 0.6 MG tablet TAKE 1 TABLET BY MOUTH EVERY DAY (Patient taking differently: Take 0.6 mg by mouth daily. ) 30 tablet 3  . diclofenac (VOLTAREN) 75 MG EC tablet Take 1 tablet (75 mg total) by mouth 2 (two) times daily as needed. 60 tablet 3  . diclofenac sodium (VOLTAREN) 1 % GEL Apply 4 g topically 4 (four) times daily as needed. 500 g 6  . Fiber CHEW Chew 1 tablet by mouth daily.    . fluticasone (FLONASE) 50 MCG/ACT nasal spray PLACE 2 SPRAYS INTO BOTH NOSTRILS DAILY. 16 g 4  . glucose blood test strip Use as instructed--- one touch verio test strips 100 each 12  . hydrochlorothiazide (HYDRODIURIL) 12.5 MG tablet TAKE 1 TABLET BY MOUTH EVERY DAY (Patient taking differently: Take 25 mg by mouth daily. ) 90 tablet 1  . ipratropium-albuterol (DUONEB) 0.5-2.5 (3) MG/3ML SOLN Take 3 mLs by nebulization every 6 (six) hours as needed. 360 mL 3  . KLOR-CON M20 20 MEQ tablet TAKE 1 TABLET BY MOUTH EVERY DAY (Patient taking  differently: Take 20 mEq by mouth daily. ) 90 tablet 1  . Lancets (ONETOUCH ULTRASOFT) lancets Use as instructed 100 each 12  . meclizine (ANTIVERT) 12.5 MG tablet Take 1 tablet (12.5 mg total) by mouth 3 (three) times daily as needed for dizziness. 30 tablet 0  . metFORMIN (GLUCOPHAGE-XR) 500 MG 24 hr tablet TAKE 1 TABLET BY MOUTH EVERY DAY WITH BREAKFAST (Patient taking differently: Take 500 mg by mouth daily  with breakfast. ) 90 tablet 1  . mometasone-formoterol (DULERA) 100-5 MCG/ACT AERO Inhale 2 puffs into the lungs 2 (two) times daily. 1 Inhaler PRN  . NONFORMULARY OR COMPOUNDED ITEM Nebulizer   DX ASTHMA 1 each 0  . ondansetron (ZOFRAN ODT) 4 MG disintegrating tablet Take 1 tablet (4 mg total) by mouth every 8 (eight) hours as needed for nausea or vomiting. 10 tablet 0  . ranitidine (ZANTAC) 150 MG tablet TAKE 1 TABLET BY MOUTH TWICE A DAY (Patient taking differently: Take 150 mg by mouth 2 (two) times daily. ) 180 tablet 1  . Spacer/Aero-Holding Chambers (AEROCHAMBER MV) inhaler Use as instructed 1 each 0  . traMADol (ULTRAM) 50 MG tablet Take 1-2 tablets (50-100 mg total) by mouth at bedtime as needed. 20 tablet 0  . zolpidem (AMBIEN) 10 MG tablet TAKE 1 TABLET BY MOUTH AT BEDTIME AS NEEDED FOR SLEEP 15 tablet 1  . metaxalone (SKELAXIN) 800 MG tablet TAKE 1 TABLET BY MOUTH THREE TIMES A DAY (Patient taking differently: Take 800 mg by mouth 3 (three) times daily. ) 30 tablet 2  . montelukast (SINGULAIR) 10 MG tablet Take 1 tablet (10 mg total) by mouth at bedtime. 90 tablet 3  . Multiple Vitamin (MULTIVITAMIN) tablet Take 1 tablet by mouth daily.    . meloxicam (MOBIC) 7.5 MG tablet TAKE 2 TABLETS BY MOUTH DAILY AS NEEDED FOR PAIN. 180 tablet 1   Facility-Administered Medications Prior to Visit  Medication Dose Route Frequency Provider Last Rate Last Dose  . ipratropium-albuterol (DUONEB) 0.5-2.5 (3) MG/3ML nebulizer solution 3 mL  3 mL Nebulization Q6H Lowne Chase, Kendrick Fries R, DO         ROS   Objective:  Physical Exam   Vitals:   11/26/18 1432  BP: 122/80  Pulse: 70  SpO2: 97%  Weight: 240 lb 6.4 oz (109 kg)  Height: 5\' 3"  (1.6 m)   97% on RA BMI Readings from Last 3 Encounters:  11/26/18 42.58 kg/m  11/21/18 41.44 kg/m  11/17/18 41.10 kg/m   Wt Readings from Last 3 Encounters:  11/26/18 240 lb 6.4 oz (109 kg)  11/21/18 241 lb 6.4 oz (109.5 kg)  11/17/18 239 lb 6.7 oz (108.6 kg)     CBC    Component Value Date/Time   WBC 9.5 11/17/2018 1230   RBC 5.84 (H) 11/17/2018 1230   HGB 15.7 (H) 11/17/2018 1230   HCT 50.2 (H) 11/17/2018 1230   PLT 178 11/17/2018 1230   MCV 86.0 11/17/2018 1230   MCH 26.9 11/17/2018 1230   MCHC 31.3 11/17/2018 1230   RDW 13.1 11/17/2018 1230   LYMPHSABS 0.4 (L) 11/17/2018 1230   MONOABS 0.6 11/17/2018 1230   EOSABS 0.0 11/17/2018 1230   BASOSABS 0.0 11/17/2018 1230   Chest Imaging: Chest x-ray 11/17/2016: Enlarged cardiac silhouette, no pulmonary edema The patient's images have been independently reviewed by me.    Pulmonary Functions Testing Results: PFT Results Latest Ref Rng & Units 07/21/2013  FVC-Predicted Pre % 56  Pre FEV1/FVC % % 89  FEV1-Pre L 1.37  FEV1-Predicted Pre % 62   FeNO: No results found for: NITRICOXIDE  Pathology: None   Echocardiogram: 04/12/2016: Preserved EF 60-65%  Heart Catheterization: None     Assessment & Plan:   SOB (shortness of breath)  Moderate persistent asthma without complication  BMI 08.6-57.8, adult (HCC)  Flu-like symptoms  Discussion:  This is a 58 year old female with recent flulike symptoms now improved.  With ongoing symptoms of her  asthma likely exacerbated by this over the past couple weeks.  She has slowly been improving.  Currently managed on Dulera but finds the inhaler expensive.  We also discussed today in the office weight management and exercise.  We will start her on Symbicort 160, 2 puffs twice daily with a spacer.  We also gave a  prescription co-pay card for $0 co-pay for the next year for commercial insurance through [employer].  Patient to follow-up with [place] in 6 weeks to see how she is doing. We will also have full pulmonary function test completed at this time.  Greater than 50% of this patient's 45-minute visit was spent face-to-face discussing above recommendations and treatment plan.   Current Outpatient Medications:  .  albuterol (PROVENTIL HFA;VENTOLIN HFA) 108 (90 Base) MCG/ACT inhaler, Inhale 2 puffs into the lungs every 6 (six) hours as needed for wheezing., Disp: 1 Inhaler, Rfl: 5 .  allopurinol (ZYLOPRIM) 100 MG tablet, TAKE 1 TABLET BY MOUTH EVERY DAY (Patient taking differently: Take 100 mg by mouth daily. ), Disp: 90 tablet, Rfl: 2 .  amLODipine (NORVASC) 5 MG tablet, TAKE 1 TABLET BY MOUTH EVERY DAY (Patient taking differently: Take 5 mg by mouth daily. ), Disp: 90 tablet, Rfl: 1 .  aspirin EC 325 MG tablet, Take 1 tablet (325 mg total) by mouth 2 (two) times daily., Disp: 84 tablet, Rfl: 0 .  azelastine (ASTELIN) 0.1 % nasal spray, Place 2 sprays into both nostrils 2 (two) times daily. Use in each nostril as directed, Disp: 30 mL, Rfl: 3 .  cetirizine (ZYRTEC) 10 MG tablet, TAKE 1 TABLET BY MOUTH EVERY DAY (Patient taking differently: Take 10 mg by mouth daily. No Therapeutic Substitution), Disp: 30 tablet, Rfl: 5 .  colchicine 0.6 MG tablet, TAKE 1 TABLET BY MOUTH EVERY DAY (Patient taking differently: Take 0.6 mg by mouth daily. ), Disp: 30 tablet, Rfl: 3 .  diclofenac (VOLTAREN) 75 MG EC tablet, Take 1 tablet (75 mg total) by mouth 2 (two) times daily as needed., Disp: 60 tablet, Rfl: 3 .  diclofenac sodium (VOLTAREN) 1 % GEL, Apply 4 g topically 4 (four) times daily as needed., Disp: 500 g, Rfl: 6 .  Fiber CHEW, Chew 1 tablet by mouth daily., Disp: , Rfl:  .  fluticasone (FLONASE) 50 MCG/ACT nasal spray, PLACE 2 SPRAYS INTO BOTH NOSTRILS DAILY., Disp: 16 g, Rfl: 4 .  glucose blood test strip, Use as  instructed--- one touch verio test strips, Disp: 100 each, Rfl: 12 .  hydrochlorothiazide (HYDRODIURIL) 12.5 MG tablet, TAKE 1 TABLET BY MOUTH EVERY DAY (Patient taking differently: Take 25 mg by mouth daily. ), Disp: 90 tablet, Rfl: 1 .  ipratropium-albuterol (DUONEB) 0.5-2.5 (3) MG/3ML SOLN, Take 3 mLs by nebulization every 6 (six) hours as needed., Disp: 360 mL, Rfl: 3 .  KLOR-CON M20 20 MEQ tablet, TAKE 1 TABLET BY MOUTH EVERY DAY (Patient taking differently: Take 20 mEq by mouth daily. ), Disp: 90 tablet, Rfl: 1 .  Lancets (ONETOUCH ULTRASOFT) lancets, Use as instructed, Disp: 100 each, Rfl: 12 .  meclizine (ANTIVERT) 12.5 MG tablet, Take 1 tablet (12.5 mg total) by mouth 3 (three) times daily as needed for dizziness., Disp: 30 tablet, Rfl: 0 .  metFORMIN (GLUCOPHAGE-XR) 500 MG 24 hr tablet, TAKE 1 TABLET BY MOUTH EVERY DAY WITH BREAKFAST (Patient taking differently: Take 500 mg by mouth daily with breakfast. ), Disp: 90 tablet, Rfl: 1 .  mometasone-formoterol (DULERA) 100-5 MCG/ACT AERO, Inhale 2 puffs into the  lungs 2 (two) times daily., Disp: 1 Inhaler, Rfl: PRN .  NONFORMULARY OR COMPOUNDED ITEM, Nebulizer   DX ASTHMA, Disp: 1 each, Rfl: 0 .  ondansetron (ZOFRAN ODT) 4 MG disintegrating tablet, Take 1 tablet (4 mg total) by mouth every 8 (eight) hours as needed for nausea or vomiting., Disp: 10 tablet, Rfl: 0 .  ranitidine (ZANTAC) 150 MG tablet, TAKE 1 TABLET BY MOUTH TWICE A DAY (Patient taking differently: Take 150 mg by mouth 2 (two) times daily. ), Disp: 180 tablet, Rfl: 1 .  Spacer/Aero-Holding Chambers (AEROCHAMBER MV) inhaler, Use as instructed, Disp: 1 each, Rfl: 0 .  traMADol (ULTRAM) 50 MG tablet, Take 1-2 tablets (50-100 mg total) by mouth at bedtime as needed., Disp: 20 tablet, Rfl: 0 .  zolpidem (AMBIEN) 10 MG tablet, TAKE 1 TABLET BY MOUTH AT BEDTIME AS NEEDED FOR SLEEP, Disp: 15 tablet, Rfl: 1 .  metaxalone (SKELAXIN) 800 MG tablet, TAKE 1 TABLET BY MOUTH THREE TIMES A DAY  (Patient taking differently: Take 800 mg by mouth 3 (three) times daily. ), Disp: 30 tablet, Rfl: 2 .  montelukast (SINGULAIR) 10 MG tablet, Take 1 tablet (10 mg total) by mouth at bedtime., Disp: 90 tablet, Rfl: 3 .  Multiple Vitamin (MULTIVITAMIN) tablet, Take 1 tablet by mouth daily., Disp: , Rfl:   Current Facility-Administered Medications:  .  ipratropium-albuterol (DUONEB) 0.5-2.5 (3) MG/3ML nebulizer solution 3 mL, 3 mL, Nebulization, Q6H, Lowne Chase, Alferd Apa, DO   Garner Nash, DO [facility] 11/26/2018 2:37 PM

## 2018-11-26 NOTE — Patient Instructions (Addendum)
Thank you for visiting Dr. Valeta Harms at [facility]. Today we recommend the following: No orders of the defined types were placed in this encounter.  Meds ordered this encounter  Medications  . budesonide-formoterol (SYMBICORT) 160-4.5 MCG/ACT inhaler    Sig: Inhale 2 puffs into the lungs every 12 (twelve) hours.    Dispense:  1 Inhaler    Refill:  0  . Spacer/Aero-Holding Chambers (AEROCHAMBER MV) inhaler    Sig: Use as instructed    Dispense:  1 each    Refill:  0   Return in about 6 weeks (around 01/07/2019).

## 2018-11-30 ENCOUNTER — Other Ambulatory Visit: Payer: Self-pay | Admitting: Family Medicine

## 2018-11-30 DIAGNOSIS — I10 Essential (primary) hypertension: Secondary | ICD-10-CM

## 2018-12-04 ENCOUNTER — Other Ambulatory Visit: Payer: Self-pay | Admitting: Family Medicine

## 2018-12-04 ENCOUNTER — Other Ambulatory Visit (INDEPENDENT_AMBULATORY_CARE_PROVIDER_SITE_OTHER): Payer: Self-pay | Admitting: Family Medicine

## 2018-12-09 ENCOUNTER — Ambulatory Visit: Payer: Federal, State, Local not specified - PPO | Attending: Family Medicine | Admitting: Physical Therapy

## 2018-12-09 DIAGNOSIS — M25511 Pain in right shoulder: Secondary | ICD-10-CM | POA: Insufficient documentation

## 2018-12-09 DIAGNOSIS — M25611 Stiffness of right shoulder, not elsewhere classified: Secondary | ICD-10-CM | POA: Insufficient documentation

## 2018-12-09 NOTE — Patient Instructions (Signed)
Scapular Retraction (Standing)   With arms at sides, pinch shoulder blades together. Repeat 10 times per set. Do 1-3 sets per session. Do 2 sessions per day.  http://orth.exer.us/944    Posture - Sitting   Sit upright, head facing forward. Try using a roll to support lower back. Keep shoulders relaxed, and avoid rounded back. Keep hips level with knees. Avoid crossing legs for long periods.  Madelyn Flavors, PT 12/09/18 1:46 PM [address] [place], [place], [address] Phone: [phone]   Fax:  [phone]

## 2018-12-09 NOTE — Therapy (Signed)
[place] [address] [place], [place], [address] Phone: [phone]   Fax:  [phone]  Physical Therapy Evaluation  Patient Details  Name: Rhonda Morrow MRN: [id number] Date of Birth: 01-12-1961 Referring Provider (PT): Hilts   Encounter Date: 12/09/2018  PT End of Session - 12/09/18 1303    Visit Number  1    Date for PT Re-Evaluation  02/03/19    PT Start Time  1303    PT Stop Time  1405    PT Time Calculation (min)  62 min    Activity Tolerance  Patient tolerated treatment well    Behavior During Therapy  [facility] for tasks assessed/performed       Past Medical History:  Diagnosis Date  . Arthritis   . Asthma   . Complication of anesthesia    one time woke up and was vey scared,17 yrs ago  . Deaf   . Diabetes mellitus without complication ([place])   . GERD (gastroesophageal reflux disease)   . Hypertension   . Left groin pain   . Thyroid disease     Past Surgical History:  Procedure Laterality Date  . CARDIAC CATHETERIZATION    . CESAREAN SECTION    . CHOLECYSTECTOMY N/A 06/20/2016   Procedure: LAPAROSCOPIC CHOLECYSTECTOMY;  Surgeon: Rolm Bookbinder, MD;  Location: [place];  Service: General;  Laterality: N/A;  . ESOPHAGEAL MANOMETRY N/A 09/29/2013   Procedure: ESOPHAGEAL MANOMETRY (EM);  Surgeon: Milus Banister, MD;  [facility];  Service: Endoscopy;  Laterality: N/A;  . TOTAL HIP ARTHROPLASTY Left 02/21/2017   Procedure: LEFT TOTAL HIP ARTHROPLASTY ANTERIOR APPROACH;  Surgeon: Leandrew Koyanagi, MD;  Location: [place];  Service: Orthopedics;  Laterality: Left;    There were no vitals filed for this visit.   Subjective Assessment - 12/09/18 1310    Subjective  Patient reports she began having right shoulder pain about 2 months ago insidious onset. Xrays show arthritis. She reports numbness at night into the right UE. She sleeps on he right side.     Patient is accompained by:  Interpreter    richard   Pertinent History  Deaf, left THA, asthma, DM, arthritis, HTN, gout right foot    Diagnostic tests  xrays - arthritis    Patient Stated Goals  to get rid of pain and numbness    Currently in Pain?  Yes    Pain Score  7     Pain Location  Shoulder    Pain Orientation  Right    Pain Descriptors / Indicators  Throbbing;Sharp    Pain Type  Acute pain    Pain Onset  More than a month ago    Pain Frequency  Constant    Aggravating Factors   moving, lying, lifting    Pain Relieving Factors  rest    Effect of Pain on Daily Activities  everything hurts         [facility] PT Assessment - 12/09/18 0001      Assessment   Medical Diagnosis  primary OA right shoulder    Referring Provider (PT)  Hilts    Onset Date/Surgical Date  10/20/18    Hand Dominance  Right      Precautions   Precaution Comments  Deaf      Balance Screen   Has the patient fallen in the past 6 months  No    Has the patient had a decrease in activity level because  of a fear of falling?   No    Is the patient reluctant to leave their home because of a fear of falling?   No      Home Film/video editor residence      Prior Function   Level of Independence  Independent    Vocation  Full time employment    Vocation Requirements  fork lift driver    Leisure  would like to be able to clean      Posture/Postural Control   Posture Comments  heavy chest causing bra straps to put extreme pressure into shoulder/UT      ROM / Strength   AROM / PROM / Strength  AROM;PROM;Strength      AROM   Overall AROM Comments  Bil cerv rotation and SB decreased 50%, no pain reported    AROM Assessment Site  Shoulder    Right/Left Shoulder  Right    Right Shoulder Flexion  127 Degrees    Right Shoulder ABduction  95 Degrees    Right Shoulder Internal Rotation  55 Degrees    Right Shoulder External Rotation  28 Degrees      PROM   PROM Assessment Site  Shoulder    Right/Left Shoulder  Right     Right Shoulder Flexion  150 Degrees    Right Shoulder ABduction  158 Degrees    Right Shoulder Internal Rotation  80 Degrees    Right Shoulder External Rotation  50 Degrees      Strength   Overall Strength Comments  pain with all except IR    Strength Assessment Site  Shoulder    Right/Left Shoulder  Right    Right Shoulder Flexion  4/5    Right Shoulder Extension  5/5    Right Shoulder ABduction  4-/5    Right Shoulder Internal Rotation  5/5    Right Shoulder External Rotation  4-/5      Palpation   Palpation comment  tender in R UT, pectorals, RC insertions      Special Tests   Other special tests  Positive impingement sign and Michel Bickers on R shoulder                Objective measurements completed on examination: See above findings.      [facility] Adult PT Treatment/Exercise - 12/09/18 0001      Modalities   Modalities  Electrical Stimulation;Cryotherapy      Cryotherapy   Number Minutes Cryotherapy  15 Minutes    Cryotherapy Location  Shoulder    Type of Cryotherapy  Ice pack      Electrical Stimulation   Electrical Stimulation Location  right shoulder    Electrical Stimulation Action  IFC    Electrical Stimulation Parameters  seated    Electrical Stimulation Goals  Pain             PT Education - 12/09/18 1546    Education Details  HEP    Person(s) Educated  Patient;Other (comment)   interpreter   Methods  Explanation;Demonstration    Comprehension  Verbalized understanding;Returned demonstration       PT Short Term Goals - 12/09/18 1555      PT SHORT TERM GOAL #1   Title  Ind with initial HEP    Time  2    Period  Weeks    Status  New    Target Date  12/23/18  PT Long Term Goals - 12/09/18 1555      PT LONG TERM GOAL #1   Title  decreased pain in right shoulder with ADLS by 50% or more to normalize ADLS.    Time  8    Period  Weeks    Status  New      PT LONG TERM GOAL #2   Title  improved R shoulder ROM to [facility] to  perform household chores and to reach behind her back.     Time  8    Period  Weeks    Status  New      PT LONG TERM GOAL #3   Title  improved right shoulder strength to 4+/5 without pain to normalize ADLS    Time  8    Period  Weeks    Status  New      PT LONG TERM GOAL #4   Title  Pt able to sleep without waking from pain or N/T    Time  8    Period  Weeks    Status  New             Plan - 12/09/18 1548    Clinical Impression Statement  Patient presents with insidious onset of right shoulder pain beginning 1-2 months ago. Patient has limited ROM and strength affecting ADLs including reaching behind her back and overhead activities. Xrays show arthritis and she has signs and symptoms consistent with impingement syndrome.     History and Personal Factors relevant to plan of care:  Deaf, left THA, asthma, DM, arthritis, HTN, gout right foot    Clinical Presentation  Evolving    Clinical Presentation due to:  worsening symptoms    Clinical Decision Making  Low    Rehab Potential  Good    PT Frequency  2x / week    PT Duration  8 weeks    PT Treatment/Interventions  ADLs/Self Care Home Management;Cryotherapy;Electrical Stimulation;Iontophoresis 4mg /ml Dexamethasone;Moist Heat;Therapeutic exercise;Neuromuscular re-education;Patient/family education;Manual techniques;Dry needling;Taping    PT Next Visit Plan  ROM, postural and shoulder strengthening; modalities for pain    PT Home Exercise Plan  scapular retraction    Consulted and Agree with Plan of Care  Patient       Patient will benefit from skilled therapeutic intervention in order to improve the following deficits and impairments:  Pain, Decreased range of motion, Decreased strength, Impaired UE functional use  Visit Diagnosis: Stiffness of right shoulder, not elsewhere classified - Plan: PT plan of care cert/re-cert  Acute pain of right shoulder - Plan: PT plan of care cert/re-cert     Problem List Patient Active  Problem List   Diagnosis Date Noted  . RLQ abdominal pain 10/20/2018  . Rectal pain 10/20/2018  . Vitamin B 12 deficiency 03/28/2018  . B12 deficiency 03/28/2018  . DM (diabetes mellitus) type II uncontrolled, periph vascular disorder ([place]) 03/28/2018  . Hyperlipidemia 03/28/2018  . Primary osteoarthritis of left hip 02/21/2017  . 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 History of laparoscopic cholecystectomy 06/20/2016  . Cerumen impaction 06/09/2016  . Hypersomnia 05/16/2016  . Knee pain, right 05/05/2016  . RUQ pain 04/09/2016  . Abnormal CT scan 03/28/2016  . Dyspnea and respiratory abnormalities 03/15/2016  . Asthma with acute exacerbation 03/15/2016  . Asthma in adult 02/25/2016  . DM (diabetes mellitus) type II controlled, neurological manifestation ([place]) 02/09/2016  . Left hip pain 12/23/2015  . Right hamstring muscle strain 11/18/2015  .  Abdominal pain, acute 09/01/2015  . Acute asthma exacerbation 04/16/2015  . Acute bronchitis 04/14/2015  . Vaginal discharge 09/14/2014  . Pap smear for cervical cancer screening 09/14/2014  . Scabies 05/06/2014  . Bed bug bite 05/06/2014  . Allergic rhinitis 04/09/2014  . Rectal itching 04/09/2014  . Bladder spasm 04/09/2014  . Knee pain, left 03/05/2014  . Hypokalemia 02/19/2014  . Nausea with vomiting 02/19/2014  . Diabetes mellitus, type II ([place]) 02/19/2014  . Edema 02/16/2014  . Disorder of rotator cuff 11/12/2013  . Routine general medical examination at a health care facility 08/20/2013  . GERD (gastroesophageal reflux disease) 06/26/2013  . Dysphagia, pharyngoesophageal phase 06/26/2013  . Suprapubic pain 05/12/2013  . Medication side effect 03/25/2013  . Diarrhea 03/25/2013  . Benign positional vertigo 01/30/2013  . Traumatic hematoma of thigh 01/15/2013  . HTN (hypertension) 09/02/2012  . Dry skin 08/22/2012  . External hemorrhoids 08/22/2012  . Obesity 06/30/2012  . Thyromegaly 05/22/2012  . Back  pain 05/22/2012  . Elevated glucose 05/22/2012  . Moderate persistent asthma 04/19/2012  . Dyspnea 01/28/2012    Madelyn Flavors PT 12/09/2018, 4:03 PM  [place] [address] [place], [place], [address] Phone: [phone]   Fax:  [phone]  Name: Rhonda Morrow MRN: [id number] Date of Birth: 1961-03-28

## 2018-12-10 ENCOUNTER — Ambulatory Visit (INDEPENDENT_AMBULATORY_CARE_PROVIDER_SITE_OTHER): Payer: Federal, State, Local not specified - PPO | Admitting: *Deleted

## 2018-12-10 DIAGNOSIS — E538 Deficiency of other specified B group vitamins: Secondary | ICD-10-CM | POA: Diagnosis not present

## 2018-12-10 MED ORDER — CYANOCOBALAMIN 1000 MCG/ML IJ SOLN
1000.0000 ug | Freq: Once | INTRAMUSCULAR | Status: AC
  Administered 2018-12-10: 1000 ug via INTRAMUSCULAR

## 2018-12-10 NOTE — Progress Notes (Addendum)
Pt here for monthly B12 injection per Dr. Carollee Herter with interpreter Jacob Moores.  B12 1016mcg given IMleft deltoid, and pt tolerated injection well.  Next B12 injection scheduled for March 4 , 2020.  Kathlene November, MD

## 2018-12-11 ENCOUNTER — Encounter: Payer: Self-pay | Admitting: Gastroenterology

## 2018-12-11 ENCOUNTER — Ambulatory Visit (INDEPENDENT_AMBULATORY_CARE_PROVIDER_SITE_OTHER): Payer: Federal, State, Local not specified - PPO | Admitting: Gastroenterology

## 2018-12-11 ENCOUNTER — Other Ambulatory Visit: Payer: Federal, State, Local not specified - PPO

## 2018-12-11 VITALS — BP 116/80 | HR 76 | Ht 62.25 in | Wt 240.5 lb

## 2018-12-11 DIAGNOSIS — R197 Diarrhea, unspecified: Secondary | ICD-10-CM | POA: Diagnosis not present

## 2018-12-11 NOTE — Progress Notes (Signed)
Review of pertinent gastrointestinal problems: 1.  Dysphasia, EGD November 2014, Dr. Ardis Hughs was completely normal.  High resolution esophageal manometry December 2014 was interpreted as normal. 2.  Routine risk for colon cancer.  Colonoscopy November 2014 Dr. Ardis Hughs for routine risk screening found a single subcentimeter polyp.  It was not adenomatous.  She was recommended to have repeat colonoscopy at 10-year interval.  HPI: This is a very pleasant 58 year old woman who was referred to me by Carollee Herter, Alferd Apa, *  to evaluate diarrhea, lower abdominal pain  She is deaf.  There is a professional sign language interpreter in the room to help translate..    For the past 2 months she has been having intermittent lower abdominal pains and diarrhea.  She is not sure if it is bloody because she never looks.  Most days it seems like her stool is very loose.  This was not the case 6 months ago.  Usually after she has the diarrhea the lower abdominal pains improve.  She is describes a lot of throbbing at her backside, anus following the diarrhea as well.  She has not had any stool testing.  She does not think she has been on any antibiotics recently.  She has definitely not traveled out of the country.  No sick contacts that she is aware of   Old Data Reviewed:  Blood work January 2020 essentially normal CBC and complete metabolic profile.  CT scan abdomen pelvis with IV and oral contrast December 2019, indication "diarrhea"; findings "no cause for the patient's diarrhea identified.  Hepatic steatosis.  Fat-containing periumbilical hernia.  This is stable.      Review of systems: Pertinent positive and negative review of systems were noted in the above HPI section. All other review negative.   Past Medical History:  Diagnosis Date  . Arthritis   . Asthma   . Complication of anesthesia    one time woke up and was vey scared,17 yrs ago  . Deaf   . Diabetes mellitus without  complication ([place])   . GERD (gastroesophageal reflux disease)   . Hypertension   . Left groin pain   . Thyroid disease     Past Surgical History:  Procedure Laterality Date  . CARDIAC CATHETERIZATION    . CESAREAN SECTION    . CHOLECYSTECTOMY N/A 06/20/2016   Procedure: LAPAROSCOPIC CHOLECYSTECTOMY;  Surgeon: Rolm Bookbinder, MD;  Location: [place];  Service: General;  Laterality: N/A;  . ESOPHAGEAL MANOMETRY N/A 09/29/2013   Procedure: ESOPHAGEAL MANOMETRY (EM);  Surgeon: Milus Banister, MD;  [facility];  Service: Endoscopy;  Laterality: N/A;  . TOTAL HIP ARTHROPLASTY Left 02/21/2017   Procedure: LEFT TOTAL HIP ARTHROPLASTY ANTERIOR APPROACH;  Surgeon: Leandrew Koyanagi, MD;  Location: [place];  Service: Orthopedics;  Laterality: Left;    Current Outpatient Medications  Medication Sig Dispense Refill  . albuterol (PROVENTIL HFA;VENTOLIN HFA) 108 (90 Base) MCG/ACT inhaler Inhale 2 puffs into the lungs every 6 (six) hours as needed for wheezing. 1 Inhaler 5  . allopurinol (ZYLOPRIM) 100 MG tablet TAKE 1 TABLET BY MOUTH EVERY DAY (Patient taking differently: Take 100 mg by mouth daily. ) 90 tablet 2  . amLODipine (NORVASC) 5 MG tablet TAKE 1 TABLET BY MOUTH EVERY DAY 90 tablet 1  . aspirin EC 325 MG tablet Take 1 tablet (325 mg total) by mouth 2 (two) times daily. 84 tablet 0  . azelastine (ASTELIN) 0.1 % nasal spray Place 2 sprays into both  nostrils 2 (two) times daily. Use in each nostril as directed 30 mL 3  . budesonide-formoterol (SYMBICORT) 160-4.5 MCG/ACT inhaler Inhale 2 puffs into the lungs every 12 (twelve) hours. 1 Inhaler 0  . cetirizine (ZYRTEC) 10 MG tablet TAKE 1 TABLET BY MOUTH EVERY DAY (Patient taking differently: Take 10 mg by mouth daily. No Therapeutic Substitution) 30 tablet 5  . colchicine 0.6 MG tablet TAKE 1 TABLET BY MOUTH EVERY DAY (Patient taking differently: Take 0.6 mg by mouth daily. ) 30 tablet 3  . diclofenac (VOLTAREN) 75 MG EC tablet Take 1 tablet (75  mg total) by mouth 2 (two) times daily as needed. 60 tablet 3  . diclofenac sodium (VOLTAREN) 1 % GEL Apply 4 g topically 4 (four) times daily as needed. 500 g 6  . Fiber CHEW Chew 1 tablet by mouth daily.    . fluticasone (FLONASE) 50 MCG/ACT nasal spray PLACE 2 SPRAYS INTO BOTH NOSTRILS DAILY. 16 g 4  . glucose blood test strip Use as instructed--- one touch verio test strips 100 each 12  . hydrochlorothiazide (HYDRODIURIL) 12.5 MG tablet TAKE 1 TABLET BY MOUTH EVERY DAY (Patient taking differently: Take 25 mg by mouth daily. ) 90 tablet 1  . ipratropium-albuterol (DUONEB) 0.5-2.5 (3) MG/3ML SOLN Take 3 mLs by nebulization every 6 (six) hours as needed. 360 mL 3  . KLOR-CON M20 20 MEQ tablet TAKE 1 TABLET BY MOUTH EVERY DAY 90 tablet 1  . Lancets (ONETOUCH ULTRASOFT) lancets Use as instructed 100 each 12  . meclizine (ANTIVERT) 12.5 MG tablet Take 1 tablet (12.5 mg total) by mouth 3 (three) times daily as needed for dizziness. 30 tablet 0  . metaxalone (SKELAXIN) 800 MG tablet TAKE 1 TABLET BY MOUTH THREE TIMES A DAY (Patient taking differently: Take 800 mg by mouth 3 (three) times daily. ) 30 tablet 2  . metFORMIN (GLUCOPHAGE-XR) 500 MG 24 hr tablet TAKE 1 TABLET BY MOUTH EVERY DAY WITH BREAKFAST (Patient taking differently: Take 500 mg by mouth daily with breakfast. ) 90 tablet 1  . mometasone-formoterol (DULERA) 100-5 MCG/ACT AERO Inhale 2 puffs into the lungs 2 (two) times daily. 1 Inhaler PRN  . montelukast (SINGULAIR) 10 MG tablet Take 1 tablet (10 mg total) by mouth at bedtime. 90 tablet 3  . Multiple Vitamin (MULTIVITAMIN) tablet Take 1 tablet by mouth daily.    . NONFORMULARY OR COMPOUNDED ITEM Nebulizer   DX ASTHMA 1 each 0  . ondansetron (ZOFRAN ODT) 4 MG disintegrating tablet Take 1 tablet (4 mg total) by mouth every 8 (eight) hours as needed for nausea or vomiting. 10 tablet 0  . ranitidine (ZANTAC) 150 MG tablet TAKE 1 TABLET BY MOUTH TWICE A DAY (Patient taking differently: Take 150  mg by mouth 2 (two) times daily. ) 180 tablet 1  . Spacer/Aero-Holding Chambers (AEROCHAMBER MV) inhaler Use as instructed 1 each 0  . traMADol (ULTRAM) 50 MG tablet TAKE 1-2 TABLETS (50-100 MG TOTAL) BY MOUTH AT BEDTIME AS NEEDED. 14 tablet 1  . zolpidem (AMBIEN) 10 MG tablet TAKE 1 TABLET BY MOUTH AT BEDTIME AS NEEDED FOR SLEEP 15 tablet 1   No current facility-administered medications for this visit.     Allergies as of 12/11/2018 - Review Complete 12/11/2018  Allergen Reaction Noted  . Losartan Shortness Of Breath 10/26/2014  . Oxycodone-acetaminophen Nausea And Vomiting 07/23/2013  . Augmentin [amoxicillin-pot clavulanate] Diarrhea 03/25/2013    Family History  Problem Relation Age of Onset  . Diabetes Mother   .  Heart disease Mother   . Diabetes Father     Social History   Socioeconomic History  . Marital status: Married    Spouse name: Not on file  . Number of children: Not on file  . Years of education: Not on file  . Highest education level: Not on file  Occupational History  . Occupation: disabled  Social Needs  . Financial resource strain: Not on file  . Food insecurity:    Worry: Not on file    Inability: Not on file  . Transportation needs:    Medical: Not on file    Non-medical: Not on file  Tobacco Use  . Smoking status: Never Smoker  . Smokeless tobacco: Never Used  Substance and Sexual Activity  . Alcohol use: No    Alcohol/week: 0.0 standard drinks  . Drug use: No  . Sexual activity: Never  Lifestyle  . Physical activity:    Days per week: Not on file    Minutes per session: Not on file  . Stress: Not on file  Relationships  . Social connections:    Talks on phone: Not on file    Gets together: Not on file    Attends religious service: Not on file    Active member of club or organization: Not on file    Attends meetings of clubs or organizations: Not on file    Relationship status: Not on file  . Intimate partner violence:    Fear of  current or ex partner: Not on file    Emotionally abused: Not on file    Physically abused: Not on file    Forced sexual activity: Not on file  Other Topics Concern  . Not on file  Social History Narrative   Lives with family.  Has 3 children.  Works for [employer].  Education: high school.     Physical Exam: BP 116/80 (BP Location: Left Arm, Patient Position: Sitting, Cuff Size: Normal)   Pulse 76   Ht 5' 2.25" (1.581 m) Comment: height measured without shoes  Wt 240 lb 8 oz (109.1 kg)   LMP 05/20/2012   BMI 43.64 kg/m  Constitutional: generally well-appearing Psychiatric: alert and oriented x3 Eyes: extraocular movements intact Mouth: oral pharynx moist, no lesions Neck: supple no lymphadenopathy Cardiovascular: heart regular rate and rhythm Lungs: clear to auscultation bilaterally Abdomen: soft, nontender, nondistended, no obvious ascites, no peritoneal signs, normal bowel sounds Extremities: no lower extremity edema bilaterally Skin: no lesions on visible extremities   Assessment and plan: 58 y.o. female with 2 months of bowel change, diarrhea, lower abdominal pains  Unclear etiology but I think we should definitely begin with infectious work-up, GI pathogen panel she understands that if this is not helpful she will probably need further testing with a colonoscopy.    Please see the "Patient Instructions" section for addition details about the plan.   Owens Loffler, MD [facility] 12/11/2018, 10:57 AM  Cc: Carollee Herter, Alferd Apa, *

## 2018-12-11 NOTE — Patient Instructions (Addendum)
GI pathogen panel.  If that is not helpful, you will probably need further testing with a colonoscopy.  Thank you for entrusting me with your care and choosing [facility].  Dr Ardis Hughs

## 2018-12-16 ENCOUNTER — Ambulatory Visit: Payer: Federal, State, Local not specified - PPO | Admitting: Physical Therapy

## 2018-12-16 ENCOUNTER — Encounter: Payer: Self-pay | Admitting: Physical Therapy

## 2018-12-16 DIAGNOSIS — M25611 Stiffness of right shoulder, not elsewhere classified: Secondary | ICD-10-CM | POA: Diagnosis not present

## 2018-12-16 DIAGNOSIS — M25511 Pain in right shoulder: Secondary | ICD-10-CM | POA: Diagnosis not present

## 2018-12-16 LAB — GASTROINTESTINAL PATHOGEN PANEL PCR
C. difficile Tox A/B, PCR: NOT DETECTED
Campylobacter, PCR: NOT DETECTED
Cryptosporidium, PCR: NOT DETECTED
E coli (ETEC) LT/ST PCR: NOT DETECTED
E coli (STEC) stx1/stx2, PCR: NOT DETECTED
E coli 0157, PCR: NOT DETECTED
Giardia lamblia, PCR: NOT DETECTED
Norovirus, PCR: NOT DETECTED
Rotavirus A, PCR: NOT DETECTED
SHIGELLA, PCR: NOT DETECTED
Salmonella, PCR: NOT DETECTED

## 2018-12-16 NOTE — Therapy (Signed)
[facility] [address], [place], [address] Phone: [phone]   Fax:  [phone]  Physical Therapy Treatment  Patient Details  Name: Rhonda Morrow MRN: [id number] Date of Birth: 26-Apr-1961 Referring Provider (PT): Hilts   Encounter Date: 12/16/2018  PT End of Session - 12/16/18 1511    Visit Number  2    Date for PT Re-Evaluation  02/03/19    PT Start Time  1430    PT Stop Time  1525    PT Time Calculation (min)  55 min    Activity Tolerance  Patient tolerated treatment well    Behavior During Therapy  [facility] for tasks assessed/performed       Past Medical History:  Diagnosis Date  . Arthritis   . Asthma   . Complication of anesthesia    one time woke up and was vey scared,17 yrs ago  . Deaf   . Diabetes mellitus without complication ([place])   . GERD (gastroesophageal reflux disease)   . Hypertension   . Left groin pain   . Thyroid disease     Past Surgical History:  Procedure Laterality Date  . CARDIAC CATHETERIZATION    . CESAREAN SECTION    . CHOLECYSTECTOMY N/A 06/20/2016   Procedure: LAPAROSCOPIC CHOLECYSTECTOMY;  Surgeon: Rolm Bookbinder, MD;  Location: [facility];  Service: General;  Laterality: N/A;  . ESOPHAGEAL MANOMETRY N/A 09/29/2013   Procedure: ESOPHAGEAL MANOMETRY (EM);  Surgeon: Milus Banister, MD;  Location: [facility];  Service: Endoscopy;  Laterality: N/A;  . TOTAL HIP ARTHROPLASTY Left 02/21/2017   Procedure: LEFT TOTAL HIP ARTHROPLASTY ANTERIOR APPROACH;  Surgeon: Leandrew Koyanagi, MD;  Location: [facility];  Service: Orthopedics;  Laterality: Left;    There were no vitals filed for this visit.  Subjective Assessment - 12/16/18 1434    Subjective  Pt reports that she has been having a busy day, did not take her med's, and is having some pain    Currently in Pain?  Yes    Pain Location  Shoulder    Pain Orientation  Right                       [facility] Adult PT  Treatment/Exercise - 12/16/18 0001      Exercises   Exercises  Shoulder      Shoulder Exercises: Standing   External Rotation  Theraband;20 reps;Both    Theraband Level (Shoulder External Rotation)  Level 1 (Yellow)    Internal Rotation  Theraband;20 reps;Both    Flexion  20 reps;AROM    ABduction  AROM;20 reps;Both    Extension  Theraband;20 reps;Both    Theraband Level (Shoulder Extension)  Level 2 (Red)    Row  Theraband;20 reps;Both    Theraband Level (Shoulder Row)  Level 2 (Red)      Shoulder Exercises: ROM/Strengthening   UBE (Upper Arm Bike)  L1 x 3 min each      Modalities   Modalities  Electrical Stimulation;Moist Heat      Moist Heat Therapy   Number Minutes Moist Heat  15 Minutes    Moist Heat Location  Shoulder      Electrical Stimulation   Electrical Stimulation Location  right shoulder    Electrical Stimulation Action  IFC    Electrical Stimulation Parameters  seate    Electrical Stimulation Goals  Pain      Manual Therapy   Manual Therapy  Joint mobilization;Passive ROM    Manual therapy comments  Some PROM taken to end range and held    Joint Mobilization  R shoulder grades 1-2    Passive ROM  PROM all directions               PT Short Term Goals - 12/16/18 1515      PT SHORT TERM GOAL #1   Title  Ind with initial HEP    Status  Achieved        PT Long Term Goals - 12/16/18 1515      PT LONG TERM GOAL #1   Title  decreased pain in right shoulder with ADLS by 79% or more to normalize ADLS.    Status  On-going            Plan - 12/16/18 1512    Clinical Impression Statement  Pt tolerated a progression to TE well. Some weakness noted with her L shoulder external rotation. Some initial pain and discomfort with R shoulder flexion and abduction. PROM is well initial guarding but she was able to relax and have full AROM.     Rehab Potential  Good    PT Treatment/Interventions  ADLs/Self Care Home Management;Cryotherapy;Electrical  Stimulation;Iontophoresis 4mg /ml Dexamethasone;Moist Heat;Therapeutic exercise;Neuromuscular re-education;Patient/family education;Manual techniques;Dry needling;Taping    PT Next Visit Plan  ROM, postural and shoulder strengthening; modalities for pain       Patient will benefit from skilled therapeutic intervention in order to improve the following deficits and impairments:  Pain, Decreased range of motion, Decreased strength, Impaired UE functional use  Visit Diagnosis: Stiffness of right shoulder, not elsewhere classified  Acute pain of right shoulder     Problem List Patient Active Problem List   Diagnosis Date Noted  . RLQ abdominal pain 10/20/2018  . Rectal pain 10/20/2018  . Vitamin B 12 deficiency 03/28/2018  . B12 deficiency 03/28/2018  . DM (diabetes mellitus) type II uncontrolled, periph vascular disorder ([place]) 03/28/2018  . Hyperlipidemia 03/28/2018  . Primary osteoarthritis of left hip 02/21/2017  . 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 History of laparoscopic cholecystectomy 06/20/2016  . Cerumen impaction 06/09/2016  . Hypersomnia 05/16/2016  . Knee pain, right 05/05/2016  . RUQ pain 04/09/2016  . Abnormal CT scan 03/28/2016  . Dyspnea and respiratory abnormalities 03/15/2016  . Asthma with acute exacerbation 03/15/2016  . Asthma in adult 02/25/2016  . DM (diabetes mellitus) type II controlled, neurological manifestation ([place]) 02/09/2016  . Left hip pain 12/23/2015  . Right hamstring muscle strain 11/18/2015  . Abdominal pain, acute 09/01/2015  . Acute asthma exacerbation 04/16/2015  . Acute bronchitis 04/14/2015  . Vaginal discharge 09/14/2014  . Pap smear for cervical cancer screening 09/14/2014  . Scabies 05/06/2014  . Bed bug bite 05/06/2014  . Allergic rhinitis 04/09/2014  . Rectal itching 04/09/2014  . Bladder spasm 04/09/2014  . Knee pain, left 03/05/2014  . Hypokalemia 02/19/2014  . Nausea with vomiting 02/19/2014  . Diabetes  mellitus, type II ([place]) 02/19/2014  . Edema 02/16/2014  . Disorder of rotator cuff 11/12/2013  . Routine general medical examination at a health care facility 08/20/2013  . GERD (gastroesophageal reflux disease) 06/26/2013  . Dysphagia, pharyngoesophageal phase 06/26/2013  . Suprapubic pain 05/12/2013  . Medication side effect 03/25/2013  . Diarrhea 03/25/2013  . Benign positional vertigo 01/30/2013  . Traumatic hematoma of thigh 01/15/2013  . HTN (hypertension) 09/02/2012  . Dry skin 08/22/2012  . External hemorrhoids 08/22/2012  .  Obesity 06/30/2012  . Thyromegaly 05/22/2012  . Back pain 05/22/2012  . Elevated glucose 05/22/2012  . Moderate persistent asthma 04/19/2012  . Dyspnea 01/28/2012    Scot Jun, PTA 12/16/2018, 3:15 PM  [address] [place], [place], [address] Phone: [phone]   Fax:  [phone]  Name: Rhonda Morrow MRN: [id number] Date of Birth: Mar 26, 1961

## 2018-12-17 ENCOUNTER — Ambulatory Visit (INDEPENDENT_AMBULATORY_CARE_PROVIDER_SITE_OTHER): Payer: Federal, State, Local not specified - PPO | Admitting: Pulmonary Disease

## 2018-12-17 ENCOUNTER — Telehealth: Payer: Self-pay | Admitting: Pulmonary Disease

## 2018-12-17 ENCOUNTER — Encounter: Payer: Self-pay | Admitting: Pulmonary Disease

## 2018-12-17 VITALS — BP 128/82 | HR 73 | Temp 98.8°F | Ht 63.0 in | Wt 237.8 lb

## 2018-12-17 DIAGNOSIS — Z6841 Body Mass Index (BMI) 40.0 and over, adult: Secondary | ICD-10-CM

## 2018-12-17 DIAGNOSIS — J301 Allergic rhinitis due to pollen: Secondary | ICD-10-CM

## 2018-12-17 DIAGNOSIS — J4540 Moderate persistent asthma, uncomplicated: Secondary | ICD-10-CM | POA: Diagnosis not present

## 2018-12-17 DIAGNOSIS — R0602 Shortness of breath: Secondary | ICD-10-CM

## 2018-12-17 NOTE — Patient Instructions (Signed)
Thank you for visiting Dr. Valeta Harms at [facility]. Today we recommend the following:  Continue regular use of your symbicort.   Would use your albuterol inhaler prior your exercise class.  Return in about 6 months (around 06/17/2019).

## 2018-12-17 NOTE — Progress Notes (Signed)
Synopsis: Referred in January 2020 as a self-referral for shortness of breath patient's PCP MW:NUUVO Koren Shiver, *  Subjective:   PATIENT ID: Rhonda Morrow GENDER: female DOB: [birth year], MRN: [id number]  Chief Complaint  Patient presents with  . Acute Home Visit    shortness of breath while exercising, felt weak, used inhaler and nebulizer , anxious and still trouble breathing, chest tightness, dry cough -takes symbicort every day but doesn't feel like it working, and albuterol twice a day works the best    This is a 58 year old that was recently seen in the emergency room for shortness of breath as well as vomiting.  She was diagnosed with a flulike illness and otherwise has been doing well since then.  She has a diagnosis of asthma since childhood.  She currently works at the [employer].  She has noticed increased shortness of breath with exertion.  She has been using her albuterol nebulizer as needed.  She does state that her husband put 3 vials of albuterol in her nebulizer which made her very tremulous.  She has been using Dulera but finds the inhaler expensive.  She does think that her symptoms from her asthma are okay controlled but have flared up since her recent illness.  She is slowly getting better.  Her shortness of breath only present with significant exertion.  Patient denies chest pain, nausea vomiting, diarrhea, hemoptysis.  OV 12/17/2018: Was at therapy yesterday for her should and developed SOB. She difficulty breathing and noticed chest tightness. Ended up going home and later last night she as having more SOB and chest tightness.  Further investigation of the symptoms appear to have only started when she was walking briskly on a treadmill.  She started to feel significantly short of breath and inability to catch her breath.  She had to stop this exercise.  She sat rested for a few minutes and then decided to attempt lifting weights.  Lifting weights she also experienced  dyspnea.  Patient only denies chest tightness and the ability to catch her breath.  Of note she did not take any of her inhaler medications prior to her attempt at exercising.  Once she took her inhalers when she got home she felt much better and her symptoms dissipated she also felt better with the use of her nebulizer in the middle of the night because she was having some trouble sleeping.  She also did not take her inhaler medications this morning prior to coming to the office today.  Does not appear to be that she has been using them on a regular basis and she has only been using them "as needed".   Past Medical History:  Diagnosis Date  . Arthritis   . Asthma   . Complication of anesthesia    one time woke up and was vey scared,17 yrs ago  . Deaf   . Diabetes mellitus without complication ([place])   . GERD (gastroesophageal reflux disease)   . Hypertension   . Left groin pain   . Thyroid disease      Family History  Problem Relation Age of Onset  . Diabetes Mother   . Heart disease Mother   . Diabetes Father      Past Surgical History:  Procedure Laterality Date  . CARDIAC CATHETERIZATION    . CESAREAN SECTION    . CHOLECYSTECTOMY N/A 06/20/2016   Procedure: LAPAROSCOPIC CHOLECYSTECTOMY;  Surgeon: Rolm Bookbinder, MD;  Location: [facility];  Service: General;  Laterality: N/A;  . ESOPHAGEAL MANOMETRY N/A 09/29/2013   Procedure: ESOPHAGEAL MANOMETRY (EM);  Surgeon: Milus Banister, MD;  Location: [facility];  Service: Endoscopy;  Laterality: N/A;  . TOTAL HIP ARTHROPLASTY Left 02/21/2017   Procedure: LEFT TOTAL HIP ARTHROPLASTY ANTERIOR APPROACH;  Surgeon: Leandrew Koyanagi, MD;  Location: [facility];  Service: Orthopedics;  Laterality: Left;    Social History   Socioeconomic History  . Marital status: Married    Spouse name: Not on file  . Number of children: Not on file  . Years of education: Not on file  . Highest education level: Not on file  Occupational History  . Occupation:  disabled  Social Needs  . Financial resource strain: Not on file  . Food insecurity:    Worry: Not on file    Inability: Not on file  . Transportation needs:    Medical: Not on file    Non-medical: Not on file  Tobacco Use  . Smoking status: Never Smoker  . Smokeless tobacco: Never Used  Substance and Sexual Activity  . Alcohol use: No    Alcohol/week: 0.0 standard drinks  . Drug use: No  . Sexual activity: Never  Lifestyle  . Physical activity:    Days per week: Not on file    Minutes per session: Not on file  . Stress: Not on file  Relationships  . Social connections:    Talks on phone: Not on file    Gets together: Not on file    Attends religious service: Not on file    Active member of club or organization: Not on file    Attends meetings of clubs or organizations: Not on file    Relationship status: Not on file  . Intimate partner violence:    Fear of current or ex partner: Not on file    Emotionally abused: Not on file    Physically abused: Not on file    Forced sexual activity: Not on file  Other Topics Concern  . Not on file  Social History Narrative   Lives with family.  Has 3 children.  Works for [employer].  Education: high school.     Allergies  Allergen Reactions  . Losartan Shortness Of Breath  . Oxycodone-Acetaminophen Nausea And Vomiting  . Augmentin [Amoxicillin-Pot Clavulanate] Diarrhea     Outpatient Medications Prior to Visit  Medication Sig Dispense Refill  . albuterol (PROVENTIL HFA;VENTOLIN HFA) 108 (90 Base) MCG/ACT inhaler Inhale 2 puffs into the lungs every 6 (six) hours as needed for wheezing. 1 Inhaler 5  . allopurinol (ZYLOPRIM) 100 MG tablet TAKE 1 TABLET BY MOUTH EVERY DAY (Patient taking differently: Take 100 mg by mouth daily. ) 90 tablet 2  . amLODipine (NORVASC) 5 MG tablet TAKE 1 TABLET BY MOUTH EVERY DAY 90 tablet 1  . aspirin EC 325 MG tablet Take 1 tablet (325 mg total) by mouth 2 (two) times daily. 84 tablet 0  . azelastine  (ASTELIN) 0.1 % nasal spray Place 2 sprays into both nostrils 2 (two) times daily. Use in each nostril as directed 30 mL 3  . budesonide-formoterol (SYMBICORT) 160-4.5 MCG/ACT inhaler Inhale 2 puffs into the lungs every 12 (twelve) hours. 1 Inhaler 0  . cetirizine (ZYRTEC) 10 MG tablet TAKE 1 TABLET BY MOUTH EVERY DAY (Patient taking differently: Take 10 mg by mouth daily. No Therapeutic Substitution) 30 tablet 5  . colchicine 0.6 MG tablet TAKE 1 TABLET BY MOUTH EVERY DAY (Patient taking differently: Take  0.6 mg by mouth daily. ) 30 tablet 3  . diclofenac (VOLTAREN) 75 MG EC tablet Take 1 tablet (75 mg total) by mouth 2 (two) times daily as needed. 60 tablet 3  . diclofenac sodium (VOLTAREN) 1 % GEL Apply 4 g topically 4 (four) times daily as needed. 500 g 6  . Fiber CHEW Chew 1 tablet by mouth daily.    . fluticasone (FLONASE) 50 MCG/ACT nasal spray PLACE 2 SPRAYS INTO BOTH NOSTRILS DAILY. 16 g 4  . glucose blood test strip Use as instructed--- one touch verio test strips 100 each 12  . hydrochlorothiazide (HYDRODIURIL) 12.5 MG tablet TAKE 1 TABLET BY MOUTH EVERY DAY (Patient taking differently: Take 25 mg by mouth daily. ) 90 tablet 1  . ipratropium-albuterol (DUONEB) 0.5-2.5 (3) MG/3ML SOLN Take 3 mLs by nebulization every 6 (six) hours as needed. 360 mL 3  . KLOR-CON M20 20 MEQ tablet TAKE 1 TABLET BY MOUTH EVERY DAY 90 tablet 1  . Lancets (ONETOUCH ULTRASOFT) lancets Use as instructed 100 each 12  . meclizine (ANTIVERT) 12.5 MG tablet Take 1 tablet (12.5 mg total) by mouth 3 (three) times daily as needed for dizziness. 30 tablet 0  . metaxalone (SKELAXIN) 800 MG tablet TAKE 1 TABLET BY MOUTH THREE TIMES A DAY (Patient taking differently: Take 800 mg by mouth 3 (three) times daily. ) 30 tablet 2  . metFORMIN (GLUCOPHAGE-XR) 500 MG 24 hr tablet TAKE 1 TABLET BY MOUTH EVERY DAY WITH BREAKFAST (Patient taking differently: Take 500 mg by mouth daily with breakfast. ) 90 tablet 1  .  mometasone-formoterol (DULERA) 100-5 MCG/ACT AERO Inhale 2 puffs into the lungs 2 (two) times daily. 1 Inhaler PRN  . montelukast (SINGULAIR) 10 MG tablet Take 1 tablet (10 mg total) by mouth at bedtime. 90 tablet 3  . Multiple Vitamin (MULTIVITAMIN) tablet Take 1 tablet by mouth daily.    . NONFORMULARY OR COMPOUNDED ITEM Nebulizer   DX ASTHMA 1 each 0  . ondansetron (ZOFRAN ODT) 4 MG disintegrating tablet Take 1 tablet (4 mg total) by mouth every 8 (eight) hours as needed for nausea or vomiting. 10 tablet 0  . ranitidine (ZANTAC) 150 MG tablet TAKE 1 TABLET BY MOUTH TWICE A DAY (Patient taking differently: Take 150 mg by mouth 2 (two) times daily. ) 180 tablet 1  . Spacer/Aero-Holding Chambers (AEROCHAMBER MV) inhaler Use as instructed 1 each 0  . traMADol (ULTRAM) 50 MG tablet TAKE 1-2 TABLETS (50-100 MG TOTAL) BY MOUTH AT BEDTIME AS NEEDED. 14 tablet 1  . zolpidem (AMBIEN) 10 MG tablet TAKE 1 TABLET BY MOUTH AT BEDTIME AS NEEDED FOR SLEEP 15 tablet 1   No facility-administered medications prior to visit.     Review of Systems  Constitutional: Negative for chills, fever, malaise/fatigue and weight loss.  HENT: Negative for hearing loss, sore throat and tinnitus.   Eyes: Negative for blurred vision and double vision.  Respiratory: Positive for shortness of breath and wheezing. Negative for cough, hemoptysis, sputum production and stridor.   Cardiovascular: Negative for chest pain, palpitations, orthopnea, leg swelling and PND.  Gastrointestinal: Negative for abdominal pain, constipation, diarrhea, heartburn, nausea and vomiting.  Genitourinary: Negative for dysuria, hematuria and urgency.  Musculoskeletal: Negative for joint pain and myalgias.  Skin: Negative for itching and rash.  Neurological: Negative for dizziness, tingling, weakness and headaches.  Endo/Heme/Allergies: Negative for environmental allergies. Does not bruise/bleed easily.  Psychiatric/Behavioral: Negative for depression.  The patient is not nervous/anxious and does not  have insomnia.   All other systems reviewed and are negative.    Objective:  Physical Exam Vitals signs reviewed.  Constitutional:      General: She is not in acute distress.    Appearance: She is well-developed. She is obese.  HENT:     Head: Normocephalic and atraumatic.  Eyes:     General: No scleral icterus.    Conjunctiva/sclera: Conjunctivae normal.     Pupils: Pupils are equal, round, and reactive to light.  Neck:     Musculoskeletal: Neck supple.     Vascular: No JVD.     Trachea: No tracheal deviation.  Cardiovascular:     Rate and Rhythm: Normal rate and regular rhythm.     Heart sounds: Normal heart sounds. No murmur.  Pulmonary:     Effort: Pulmonary effort is normal. No tachypnea, accessory muscle usage or respiratory distress.     Breath sounds: Normal breath sounds. No stridor. No wheezing, rhonchi or rales.  Abdominal:     General: Bowel sounds are normal. There is no distension.     Palpations: Abdomen is soft.     Tenderness: There is no abdominal tenderness.  Musculoskeletal:        General: No tenderness.  Lymphadenopathy:     Cervical: No cervical adenopathy.  Skin:    General: Skin is warm and dry.     Capillary Refill: Capillary refill takes less than 2 seconds.     Findings: No rash.  Neurological:     Mental Status: She is alert and oriented to person, place, and time.  Psychiatric:        Behavior: Behavior normal.      Vitals:   12/17/18 1404 12/17/18 1405  BP:  128/82  Pulse:  73  Temp: 98.8 F (37.1 C)   TempSrc: Oral   SpO2:  97%  Weight: 237 lb 12.8 oz (107.9 kg)   Height: 5\' 3"  (1.6 m)    97% on RA BMI Readings from Last 3 Encounters:  12/17/18 42.12 kg/m  12/11/18 43.64 kg/m  11/26/18 42.58 kg/m   Wt Readings from Last 3 Encounters:  12/17/18 237 lb 12.8 oz (107.9 kg)  12/11/18 240 lb 8 oz (109.1 kg)  11/26/18 240 lb 6.4 oz (109 kg)     CBC    Component Value  Date/Time   WBC 9.5 11/17/2018 1230   RBC 5.84 (H) 11/17/2018 1230   HGB 15.7 (H) 11/17/2018 1230   HCT 50.2 (H) 11/17/2018 1230   PLT 178 11/17/2018 1230   MCV 86.0 11/17/2018 1230   MCH 26.9 11/17/2018 1230   MCHC 31.3 11/17/2018 1230   RDW 13.1 11/17/2018 1230   LYMPHSABS 0.4 (L) 11/17/2018 1230   MONOABS 0.6 11/17/2018 1230   EOSABS 0.0 11/17/2018 1230   BASOSABS 0.0 11/17/2018 1230   Chest Imaging: Chest x-ray 11/17/2016: Enlarged cardiac silhouette, no pulmonary edema The patient's images have been independently reviewed by me.    Pulmonary Functions Testing Results: PFT Results Latest Ref Rng & Units 07/21/2013  FVC-Predicted Pre % 56  Pre FEV1/FVC % % 89  FEV1-Pre L 1.37  FEV1-Predicted Pre % 62   FeNO: No results found for: NITRICOXIDE  Pathology: None   Echocardiogram: 04/12/2016: Preserved EF 60-65%  Heart Catheterization: None     Assessment & Plan:   SOB (shortness of breath)  Moderate persistent asthma without complication  BMI 62.5-63.8, adult (HCC)  Seasonal allergic rhinitis due to pollen  Discussion:  This is a  58 year old female with moderate persistent asthma symptoms.  She has not been using her inhaler regimen and as directed.  Patient was counseled on this.  I do think that most of her symptoms are related to her not using her medications and then attempting to participate in a exercise class for the very first time.  She has not participated in any strenuous activity in a long time and then as of yesterday decided to start lifting weights and walking on a treadmill at her current morbidly obese state with a BMI of 42.  Therefore I encouraged her to use her inhaler in the morning and at night despite whether or not she is having symptoms of her Symbicort as well as before her next exercise class to use her albuterol inhaler before she starts her regimen.  Patient to follow-up with [place] in 4 to 6 months.  Also was counseled on continued weight loss  which I think this is very important of the management of her disease process.  Greater than 50% of this patient's 25-minute office visit was spent face-to-face discussing the above recommendations and treatment plan as well as counseling on appropriate inhaler use.   Current Outpatient Medications:  .  albuterol (PROVENTIL HFA;VENTOLIN HFA) 108 (90 Base) MCG/ACT inhaler, Inhale 2 puffs into the lungs every 6 (six) hours as needed for wheezing., Disp: 1 Inhaler, Rfl: 5 .  allopurinol (ZYLOPRIM) 100 MG tablet, TAKE 1 TABLET BY MOUTH EVERY DAY (Patient taking differently: Take 100 mg by mouth daily. ), Disp: 90 tablet, Rfl: 2 .  amLODipine (NORVASC) 5 MG tablet, TAKE 1 TABLET BY MOUTH EVERY DAY, Disp: 90 tablet, Rfl: 1 .  aspirin EC 325 MG tablet, Take 1 tablet (325 mg total) by mouth 2 (two) times daily., Disp: 84 tablet, Rfl: 0 .  azelastine (ASTELIN) 0.1 % nasal spray, Place 2 sprays into both nostrils 2 (two) times daily. Use in each nostril as directed, Disp: 30 mL, Rfl: 3 .  budesonide-formoterol (SYMBICORT) 160-4.5 MCG/ACT inhaler, Inhale 2 puffs into the lungs every 12 (twelve) hours., Disp: 1 Inhaler, Rfl: 0 .  cetirizine (ZYRTEC) 10 MG tablet, TAKE 1 TABLET BY MOUTH EVERY DAY (Patient taking differently: Take 10 mg by mouth daily. No Therapeutic Substitution), Disp: 30 tablet, Rfl: 5 .  colchicine 0.6 MG tablet, TAKE 1 TABLET BY MOUTH EVERY DAY (Patient taking differently: Take 0.6 mg by mouth daily. ), Disp: 30 tablet, Rfl: 3 .  diclofenac (VOLTAREN) 75 MG EC tablet, Take 1 tablet (75 mg total) by mouth 2 (two) times daily as needed., Disp: 60 tablet, Rfl: 3 .  diclofenac sodium (VOLTAREN) 1 % GEL, Apply 4 g topically 4 (four) times daily as needed., Disp: 500 g, Rfl: 6 .  Fiber CHEW, Chew 1 tablet by mouth daily., Disp: , Rfl:  .  fluticasone (FLONASE) 50 MCG/ACT nasal spray, PLACE 2 SPRAYS INTO BOTH NOSTRILS DAILY., Disp: 16 g, Rfl: 4 .  glucose blood test strip, Use as instructed--- one  touch verio test strips, Disp: 100 each, Rfl: 12 .  hydrochlorothiazide (HYDRODIURIL) 12.5 MG tablet, TAKE 1 TABLET BY MOUTH EVERY DAY (Patient taking differently: Take 25 mg by mouth daily. ), Disp: 90 tablet, Rfl: 1 .  ipratropium-albuterol (DUONEB) 0.5-2.5 (3) MG/3ML SOLN, Take 3 mLs by nebulization every 6 (six) hours as needed., Disp: 360 mL, Rfl: 3 .  KLOR-CON M20 20 MEQ tablet, TAKE 1 TABLET BY MOUTH EVERY DAY, Disp: 90 tablet, Rfl: 1 .  Lancets (ONETOUCH ULTRASOFT)  lancets, Use as instructed, Disp: 100 each, Rfl: 12 .  meclizine (ANTIVERT) 12.5 MG tablet, Take 1 tablet (12.5 mg total) by mouth 3 (three) times daily as needed for dizziness., Disp: 30 tablet, Rfl: 0 .  metaxalone (SKELAXIN) 800 MG tablet, TAKE 1 TABLET BY MOUTH THREE TIMES A DAY (Patient taking differently: Take 800 mg by mouth 3 (three) times daily. ), Disp: 30 tablet, Rfl: 2 .  metFORMIN (GLUCOPHAGE-XR) 500 MG 24 hr tablet, TAKE 1 TABLET BY MOUTH EVERY DAY WITH BREAKFAST (Patient taking differently: Take 500 mg by mouth daily with breakfast. ), Disp: 90 tablet, Rfl: 1 .  mometasone-formoterol (DULERA) 100-5 MCG/ACT AERO, Inhale 2 puffs into the lungs 2 (two) times daily., Disp: 1 Inhaler, Rfl: PRN .  montelukast (SINGULAIR) 10 MG tablet, Take 1 tablet (10 mg total) by mouth at bedtime., Disp: 90 tablet, Rfl: 3 .  Multiple Vitamin (MULTIVITAMIN) tablet, Take 1 tablet by mouth daily., Disp: , Rfl:  .  NONFORMULARY OR COMPOUNDED ITEM, Nebulizer   DX ASTHMA, Disp: 1 each, Rfl: 0 .  ondansetron (ZOFRAN ODT) 4 MG disintegrating tablet, Take 1 tablet (4 mg total) by mouth every 8 (eight) hours as needed for nausea or vomiting., Disp: 10 tablet, Rfl: 0 .  ranitidine (ZANTAC) 150 MG tablet, TAKE 1 TABLET BY MOUTH TWICE A DAY (Patient taking differently: Take 150 mg by mouth 2 (two) times daily. ), Disp: 180 tablet, Rfl: 1 .  Spacer/Aero-Holding Chambers (AEROCHAMBER MV) inhaler, Use as instructed, Disp: 1 each, Rfl: 0 .  traMADol  (ULTRAM) 50 MG tablet, TAKE 1-2 TABLETS (50-100 MG TOTAL) BY MOUTH AT BEDTIME AS NEEDED., Disp: 14 tablet, Rfl: 1 .  zolpidem (AMBIEN) 10 MG tablet, TAKE 1 TABLET BY MOUTH AT BEDTIME AS NEEDED FOR SLEEP, Disp: 15 tablet, Rfl: 1   Garner Nash, DO [facility] 12/17/2018 2:10 PM

## 2018-12-18 ENCOUNTER — Other Ambulatory Visit: Payer: Self-pay

## 2018-12-18 ENCOUNTER — Encounter: Payer: Self-pay | Admitting: Physical Therapy

## 2018-12-18 ENCOUNTER — Ambulatory Visit: Payer: Federal, State, Local not specified - PPO | Admitting: Physical Therapy

## 2018-12-18 DIAGNOSIS — M25511 Pain in right shoulder: Secondary | ICD-10-CM | POA: Diagnosis not present

## 2018-12-18 DIAGNOSIS — M25611 Stiffness of right shoulder, not elsewhere classified: Secondary | ICD-10-CM

## 2018-12-18 MED ORDER — CHOLESTYRAMINE 4 GM/DOSE PO POWD: 4.0000 g | Freq: Every day | ORAL | 3 refills | Status: DC

## 2018-12-18 NOTE — Telephone Encounter (Signed)
Error

## 2018-12-18 NOTE — Therapy (Signed)
[place] [place] [place], [place], [address] Phone: [phone]   Fax:  [phone]  Physical Therapy Treatment  Patient Details  Name: Rhonda Morrow MRN: [id number] Date of Birth: Oct 31, 1961 Referring Provider (PT): Hilts   Encounter Date: 12/18/2018  PT End of Session - 12/18/18 1651    Visit Number  3    Date for PT Re-Evaluation  02/03/19    PT Start Time  1604    PT Stop Time  1704    PT Time Calculation (min)  60 min    Activity Tolerance  Patient tolerated treatment well    Behavior During Therapy  [facility] for tasks assessed/performed       Past Medical History:  Diagnosis Date  . Arthritis   . Asthma   . Complication of anesthesia    one time woke up and was vey scared,17 yrs ago  . Deaf   . Diabetes mellitus without complication ([place])   . GERD (gastroesophageal reflux disease)   . Hypertension   . Left groin pain   . Thyroid disease     Past Surgical History:  Procedure Laterality Date  . CARDIAC CATHETERIZATION    . CESAREAN SECTION    . CHOLECYSTECTOMY N/A 06/20/2016   Procedure: LAPAROSCOPIC CHOLECYSTECTOMY;  Surgeon: Rolm Bookbinder, MD;  Location: [place];  Service: General;  Laterality: N/A;  . ESOPHAGEAL MANOMETRY N/A 09/29/2013   Procedure: ESOPHAGEAL MANOMETRY (EM);  Surgeon: Milus Banister, MD;  [facility];  Service: Endoscopy;  Laterality: N/A;  . TOTAL HIP ARTHROPLASTY Left 02/21/2017   Procedure: LEFT TOTAL HIP ARTHROPLASTY ANTERIOR APPROACH;  Surgeon: Leandrew Koyanagi, MD;  Location: [place];  Service: Orthopedics;  Laterality: Left;    There were no vitals filed for this visit.  Subjective Assessment - 12/18/18 1610    Subjective  No pain today, stated that she worked with a trainer at the gym    Currently in Pain?  No/denies                       [facility] Adult PT Treatment/Exercise - 12/18/18 0001      Posture/Postural Control   Posture Comments   heavy chest causing bra straps to put extreme pressure into shoulder/UT      Shoulder Exercises: Standing   Flexion  20 reps;Strengthening    Shoulder Flexion Weight (lbs)  2    ABduction  Weights;20 reps;Strengthening    Shoulder ABduction Weight (lbs)  1    Extension  Theraband;20 reps;Both    Theraband Level (Shoulder Extension)  Level 3 (Green)    Row  [employer];Both    Theraband Level (Shoulder Row)  Level 3 (Green)      Shoulder Exercises: ROM/Strengthening   UBE (Upper Arm Bike)  L2 x 3 min each      Moist Heat Therapy   Number Minutes Moist Heat  15 Minutes    Moist Heat Location  Shoulder      Electrical Stimulation   Electrical Stimulation Location  right shoulder    Electrical Stimulation Action  IFC    Electrical Stimulation Parameters  supine    Electrical Stimulation Goals  Pain      Manual Therapy   Manual Therapy  Joint mobilization;Passive ROM    Manual therapy comments  Some PROM taken to end range and held    Joint Mobilization  R shoulder grades 1-2    Passive  ROM  PROM all directions               PT Short Term Goals - 12/16/18 1515      PT SHORT TERM GOAL #1   Title  Ind with initial HEP    Status  Achieved        PT Long Term Goals - 12/16/18 1515      PT LONG TERM GOAL #1   Title  decreased pain in right shoulder with ADLS by 70% or more to normalize ADLS.    Status  On-going            Plan - 12/18/18 1652    Clinical Impression Statement  Pt tolerated a progression to Te well. Postural cues to keep shoulder back with seated rows. Pt tolerated an increase in resistance with standing shoulder flex and abduction. Positive response to Jt mobs evident by decrease resistance at end range compared to before jt mobs.    Rehab Potential  Good    PT Frequency  2x / week    PT Duration  8 weeks    PT Treatment/Interventions  ADLs/Self Care Home Management;Cryotherapy;Electrical Stimulation;Iontophoresis 4mg /ml  Dexamethasone;Moist Heat;Therapeutic exercise;Neuromuscular re-education;Patient/family education;Manual techniques;Dry needling;Taping    PT Next Visit Plan  ROM, postural and shoulder strengthening; modalities for pain       Patient will benefit from skilled therapeutic intervention in order to improve the following deficits and impairments:  Pain, Decreased range of motion, Decreased strength, Impaired UE functional use  Visit Diagnosis: Stiffness of right shoulder, not elsewhere classified  Acute pain of right shoulder     Problem List Patient Active Problem List   Diagnosis Date Noted  . RLQ abdominal pain 10/20/2018  . Rectal pain 10/20/2018  . Vitamin B 12 deficiency 03/28/2018  . B12 deficiency 03/28/2018  . DM (diabetes mellitus) type II uncontrolled, periph vascular disorder ([place]) 03/28/2018  . Hyperlipidemia 03/28/2018  . Primary osteoarthritis of left hip 02/21/2017  . 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 History of laparoscopic cholecystectomy 06/20/2016  . Cerumen impaction 06/09/2016  . Hypersomnia 05/16/2016  . Knee pain, right 05/05/2016  . RUQ pain 04/09/2016  . Abnormal CT scan 03/28/2016  . Dyspnea and respiratory abnormalities 03/15/2016  . Asthma with acute exacerbation 03/15/2016  . Asthma in adult 02/25/2016  . DM (diabetes mellitus) type II controlled, neurological manifestation ([place]) 02/09/2016  . Left hip pain 12/23/2015  . Right hamstring muscle strain 11/18/2015  . Abdominal pain, acute 09/01/2015  . Acute asthma exacerbation 04/16/2015  . Acute bronchitis 04/14/2015  . Vaginal discharge 09/14/2014  . Pap smear for cervical cancer screening 09/14/2014  . Scabies 05/06/2014  . Bed bug bite 05/06/2014  . Allergic rhinitis 04/09/2014  . Rectal itching 04/09/2014  . Bladder spasm 04/09/2014  . Knee pain, left 03/05/2014  . Hypokalemia 02/19/2014  . Nausea with vomiting 02/19/2014  . Diabetes mellitus, type II ([place])  02/19/2014  . Edema 02/16/2014  . Disorder of rotator cuff 11/12/2013  . Routine general medical examination at a health care facility 08/20/2013  . GERD (gastroesophageal reflux disease) 06/26/2013  . Dysphagia, pharyngoesophageal phase 06/26/2013  . Suprapubic pain 05/12/2013  . Medication side effect 03/25/2013  . Diarrhea 03/25/2013  . Benign positional vertigo 01/30/2013  . Traumatic hematoma of thigh 01/15/2013  . HTN (hypertension) 09/02/2012  . Dry skin 08/22/2012  . External hemorrhoids 08/22/2012  . Obesity 06/30/2012  . Thyromegaly 05/22/2012  . Back pain 05/22/2012  .  Elevated glucose 05/22/2012  . Moderate persistent asthma 04/19/2012  . Dyspnea 01/28/2012    Scot Jun, PTA 12/18/2018, 4:56 PM  [address] [place], [place], [address] Phone: [phone]   Fax:  [phone]  Name: Rhonda Morrow MRN: [id number] Date of Birth: 10-15-1961

## 2018-12-20 ENCOUNTER — Other Ambulatory Visit: Payer: Self-pay | Admitting: Family Medicine

## 2018-12-23 ENCOUNTER — Encounter: Payer: Self-pay | Admitting: Physical Therapy

## 2018-12-23 ENCOUNTER — Ambulatory Visit: Payer: Federal, State, Local not specified - PPO | Admitting: Physical Therapy

## 2018-12-23 ENCOUNTER — Telehealth: Payer: Self-pay | Admitting: Gastroenterology

## 2018-12-23 DIAGNOSIS — M25611 Stiffness of right shoulder, not elsewhere classified: Secondary | ICD-10-CM

## 2018-12-23 DIAGNOSIS — M25511 Pain in right shoulder: Secondary | ICD-10-CM

## 2018-12-23 NOTE — Telephone Encounter (Signed)
Pt called in and stated that she had to pick up a prescription from the drug store which was a big box of powder substances. She is confused on wht it was for and why so much. She is wanting a call with explanation

## 2018-12-23 NOTE — Therapy (Signed)
[place], [place], [address] Phone: [phone]   Fax:  [phone]  Physical Therapy Treatment  Patient Details  Name: Rhonda Morrow MRN: [id number] Date of Birth: Aug 20, 1961 Referring Provider (PT): Hilts   Encounter Date: 12/23/2018  PT End of Session - 12/23/18 1648    Visit Number  4    Date for PT Re-Evaluation  02/03/19    PT Start Time  1600    PT Stop Time  1703    PT Time Calculation (min)  63 min    Activity Tolerance  Patient tolerated treatment well    Behavior During Therapy  [facility] for tasks assessed/performed       Past Medical History:  Diagnosis Date  . Arthritis   . Asthma   . Complication of anesthesia    one time woke up and was vey scared,17 yrs ago  . Deaf   . Diabetes mellitus without complication ([place])   . GERD (gastroesophageal reflux disease)   . Hypertension   . Left groin pain   . Thyroid disease     Past Surgical History:  Procedure Laterality Date  . CARDIAC CATHETERIZATION    . CESAREAN SECTION    . CHOLECYSTECTOMY N/A 06/20/2016   Procedure: LAPAROSCOPIC CHOLECYSTECTOMY;  Surgeon: Rolm Bookbinder, MD;  Location: [place];  Service: General;  Laterality: N/A;  . ESOPHAGEAL MANOMETRY N/A 09/29/2013   Procedure: ESOPHAGEAL MANOMETRY (EM);  Surgeon: Milus Banister, MD;  [facility];  Service: Endoscopy;  Laterality: N/A;  . TOTAL HIP ARTHROPLASTY Left 02/21/2017   Procedure: LEFT TOTAL HIP ARTHROPLASTY ANTERIOR APPROACH;  Surgeon: Leandrew Koyanagi, MD;  Location: [place];  Service: Orthopedics;  Laterality: Left;    There were no vitals filed for this visit.  Subjective Assessment - 12/23/18 1611    Subjective  pt reports a busy day taking family members to appointments. She stated that her mom had her carry some heave items today, no pain    Currently in Pain?  No/denies         [facility] PT Assessment - 12/23/18 0001      AROM   Right Shoulder  Flexion  155 Degrees    Right Shoulder ABduction  145 Degrees    Right Shoulder Internal Rotation  90 Degrees    Right Shoulder External Rotation  43 Degrees                   [facility] Adult PT Treatment/Exercise - 12/23/18 0001      Shoulder Exercises: Standing   External Rotation  Theraband;20 reps;Both    Theraband Level (Shoulder External Rotation)  Level 1 (Yellow)    Internal Rotation  Theraband;20 reps;Both    Theraband Level (Shoulder Internal Rotation)  Level 1 (Yellow)    Flexion  20 reps;Strengthening    Shoulder Flexion Weight (lbs)  2    ABduction  Weights;20 reps;Strengthening    Shoulder ABduction Weight (lbs)  1      Shoulder Exercises: ROM/Strengthening   UBE (Upper Arm Bike)  L2 x 3 min each    Other ROM/Strengthening Exercises  Rows & Lats 25lb 2x10      Moist Heat Therapy   Number Minutes Moist Heat  15 Minutes    Moist Heat Location  Shoulder      Electrical Stimulation   Electrical Stimulation Location  right shoulder    Electrical Stimulation Action  IFC  Electrical Stimulation Parameters  supine    Electrical Stimulation Goals  Pain               PT Short Term Goals - 12/16/18 1515      PT SHORT TERM GOAL #1   Title  Ind with initial HEP    Status  Achieved        PT Long Term Goals - 12/16/18 1515      PT LONG TERM GOAL #1   Title  decreased pain in right shoulder with ADLS by 28% or more to normalize ADLS.    Status  On-going            Plan - 12/23/18 1648    Clinical Impression Statement  Pt has progressed increasing her R shoulder AROM in all  directions. Tactile cues to keep elbow to side with internal and external rotation. Good strength and ROM with seated Rows and lats. Both arms with burning fatigue with flexion and abduction.    Rehab Potential  Good    PT Frequency  2x / week    PT Duration  8 weeks    PT Treatment/Interventions  ADLs/Self Care Home Management;Cryotherapy;Electrical  Stimulation;Iontophoresis 4mg /ml Dexamethasone;Moist Heat;Therapeutic exercise;Neuromuscular re-education;Patient/family education;Manual techniques;Dry needling;Taping    PT Next Visit Plan  ROM, postural and shoulder strengthening; modalities for pain       Patient will benefit from skilled therapeutic intervention in order to improve the following deficits and impairments:  Pain, Decreased range of motion, Decreased strength, Impaired UE functional use  Visit Diagnosis: Stiffness of right shoulder, not elsewhere classified  Acute pain of right shoulder     Problem List Patient Active Problem List   Diagnosis Date Noted  . RLQ abdominal pain 10/20/2018  . Rectal pain 10/20/2018  . Vitamin B 12 deficiency 03/28/2018  . B12 deficiency 03/28/2018  . DM (diabetes mellitus) type II uncontrolled, periph vascular disorder ([place]) 03/28/2018  . Hyperlipidemia 03/28/2018  . Primary osteoarthritis of left hip 02/21/2017  . 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 History of laparoscopic cholecystectomy 06/20/2016  . Cerumen impaction 06/09/2016  . Hypersomnia 05/16/2016  . Knee pain, right 05/05/2016  . RUQ pain 04/09/2016  . Abnormal CT scan 03/28/2016  . Dyspnea and respiratory abnormalities 03/15/2016  . Asthma with acute exacerbation 03/15/2016  . Asthma in adult 02/25/2016  . DM (diabetes mellitus) type II controlled, neurological manifestation ([place]) 02/09/2016  . Left hip pain 12/23/2015  . Right hamstring muscle strain 11/18/2015  . Abdominal pain, acute 09/01/2015  . Acute asthma exacerbation 04/16/2015  . Acute bronchitis 04/14/2015  . Vaginal discharge 09/14/2014  . Pap smear for cervical cancer screening 09/14/2014  . Scabies 05/06/2014  . Bed bug bite 05/06/2014  . Allergic rhinitis 04/09/2014  . Rectal itching 04/09/2014  . Bladder spasm 04/09/2014  . Knee pain, left 03/05/2014  . Hypokalemia 02/19/2014  . Nausea with vomiting 02/19/2014  . Diabetes  mellitus, type II ([place]) 02/19/2014  . Edema 02/16/2014  . Disorder of rotator cuff 11/12/2013  . Routine general medical examination at a health care facility 08/20/2013  . GERD (gastroesophageal reflux disease) 06/26/2013  . Dysphagia, pharyngoesophageal phase 06/26/2013  . Suprapubic pain 05/12/2013  . Medication side effect 03/25/2013  . Diarrhea 03/25/2013  . Benign positional vertigo 01/30/2013  . Traumatic hematoma of thigh 01/15/2013  . HTN (hypertension) 09/02/2012  . Dry skin 08/22/2012  . External hemorrhoids 08/22/2012  . Obesity 06/30/2012  . Thyromegaly 05/22/2012  .  Back pain 05/22/2012  . Elevated glucose 05/22/2012  . Moderate persistent asthma 04/19/2012  . Dyspnea 01/28/2012    Scot Jun, PTA 12/23/2018, 4:51 PM  [place] [address] [place], [place], [address] Phone: [phone]   Fax:  [phone]  Name: SHAMONE WINZER MRN: [id number] Date of Birth: 18-Sep-1961

## 2018-12-24 NOTE — Telephone Encounter (Signed)
Left message to call back  

## 2018-12-25 ENCOUNTER — Ambulatory Visit: Payer: Federal, State, Local not specified - PPO | Admitting: Physical Therapy

## 2018-12-25 ENCOUNTER — Encounter: Payer: Self-pay | Admitting: Physical Therapy

## 2018-12-25 DIAGNOSIS — M25611 Stiffness of right shoulder, not elsewhere classified: Secondary | ICD-10-CM

## 2018-12-25 DIAGNOSIS — M25511 Pain in right shoulder: Secondary | ICD-10-CM

## 2018-12-25 NOTE — Therapy (Signed)
[place] [address] [place], [place], [address] Phone: [phone]   Fax:  [phone]  Physical Therapy Treatment  Patient Details  Name: Rhonda Morrow MRN: [id number] Date of Birth: 1960/12/14 Referring Provider (PT): Hilts   Encounter Date: 12/25/2018  PT End of Session - 12/25/18 1708    Visit Number  5    Date for PT Re-Evaluation  02/03/19    PT Start Time  3557    PT Stop Time  1708    PT Time Calculation (min)  58 min    Activity Tolerance  Patient tolerated treatment well    Behavior During Therapy  [facility] for tasks assessed/performed       Past Medical History:  Diagnosis Date  . Arthritis   . Asthma   . Complication of anesthesia    one time woke up and was vey scared,17 yrs ago  . Deaf   . Diabetes mellitus without complication ([place])   . GERD (gastroesophageal reflux disease)   . Hypertension   . Left groin pain   . Thyroid disease     Past Surgical History:  Procedure Laterality Date  . CARDIAC CATHETERIZATION    . CESAREAN SECTION    . CHOLECYSTECTOMY N/A 06/20/2016   Procedure: LAPAROSCOPIC CHOLECYSTECTOMY;  Surgeon: Rolm Bookbinder, MD;  Location: [place];  Service: General;  Laterality: N/A;  . ESOPHAGEAL MANOMETRY N/A 09/29/2013   Procedure: ESOPHAGEAL MANOMETRY (EM);  Surgeon: Milus Banister, MD;  [facility];  Service: Endoscopy;  Laterality: N/A;  . TOTAL HIP ARTHROPLASTY Left 02/21/2017   Procedure: LEFT TOTAL HIP ARTHROPLASTY ANTERIOR APPROACH;  Surgeon: Leandrew Koyanagi, MD;  Location: [place];  Service: Orthopedics;  Laterality: Left;    There were no vitals filed for this visit.  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  Yes    Pain Score  1     Pain Location  Shoulder    Pain Orientation  Right;Lateral    Aggravating Factors   lifting                       [facility] Adult PT Treatment/Exercise - 12/25/18 0001      Shoulder Exercises: Seated   External Rotation  20 reps;Right;Weights    External Rotation Weight (lbs)  2    External Rotation Limitations  PT holding arm at 90 degree abduction      Shoulder Exercises: Standing   External Rotation  Theraband;20 reps;Both    Theraband Level (Shoulder External Rotation)  Level 2 (Red)    External Rotation Limitations  red was difficult    Internal Rotation  Theraband;20 reps;Both    Theraband Level (Shoulder Internal Rotation)  Level 2 (Red)    ABduction  Weights;20 reps;Strengthening    Shoulder ABduction Weight (lbs)  2    ABduction Limitations  up to 90 degrees only    Other Standing Exercises  weighted ball rhythmic stabilization facing wall and back to wall      Shoulder Exercises: ROM/Strengthening   UBE (Upper Arm Bike)  level 3 3 min fwd, 3 min bkwd    Other ROM/Strengthening Exercises  Rows & Lats 25lb 2x10      Modalities   Modalities  Iontophoresis  Cryotherapy   Number Minutes Cryotherapy  10 Minutes    Cryotherapy Location  Shoulder    Type of Cryotherapy  Ice pack      Electrical Stimulation   Electrical Stimulation Location  right shoulder    Electrical Stimulation Action  IFC    Electrical Stimulation Parameters  sitting    Electrical Stimulation Goals  Pain      Iontophoresis   Type of Iontophoresis  Dexamethasone    Location  right lateral shoulder    Dose  64mA    Time  4 hour patch             PT Education - 12/25/18 1708    Education Details  educated with the interpreter about the ionto patch and dexamethasone    Person(s) Educated  Patient    Methods  Explanation    Comprehension  Verbalized understanding       PT Short Term Goals - 12/16/18 1515      PT SHORT TERM GOAL #1   Title  Ind with initial HEP    Status  Achieved        PT Long Term Goals - 12/25/18 1712       PT LONG TERM GOAL #1   Title  decreased pain in right shoulder with ADLS by 06% or more to normalize ADLS.    Status  On-going      PT LONG TERM GOAL #2   Title  improved R shoulder ROM to [facility] to perform household chores and to reach behind her back.     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  She has pain with activity.  Requires a lot of cues for scapular retraction during exercises    PT Next Visit Plan  see if the ionto helped    Consulted and Agree with Plan of Care  Patient       Patient will benefit from skilled therapeutic intervention in order to improve the following deficits and impairments:  Pain, Decreased range of motion, Decreased strength, Impaired UE functional use  Visit Diagnosis: Stiffness of right shoulder, not elsewhere classified  Acute pain of right shoulder     Problem List Patient Active Problem List   Diagnosis Date Noted  . RLQ abdominal pain 10/20/2018  . Rectal pain 10/20/2018  . Vitamin B 12 deficiency 03/28/2018  . B12 deficiency 03/28/2018  . DM (diabetes mellitus) type II uncontrolled, periph vascular disorder ([place]) 03/28/2018  . Hyperlipidemia 03/28/2018  . Primary osteoarthritis of left hip 02/21/2017  . 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 History of laparoscopic cholecystectomy 06/20/2016  . Cerumen impaction 06/09/2016  . Hypersomnia 05/16/2016  . Knee pain, right 05/05/2016  . RUQ pain 04/09/2016  . Abnormal CT scan  03/28/2016  .  Dyspnea and respiratory abnormalities 03/15/2016  . Asthma with acute exacerbation 03/15/2016  . Asthma in adult 02/25/2016  . DM (diabetes mellitus) type II controlled, neurological manifestation ([place]) 02/09/2016  . Left hip pain 12/23/2015  . Right hamstring muscle strain 11/18/2015  . Abdominal pain, acute 09/01/2015  . Acute asthma exacerbation 04/16/2015  . Acute bronchitis 04/14/2015  . Vaginal discharge 09/14/2014  . Pap smear for cervical cancer screening 09/14/2014  . Scabies 05/06/2014  . Bed bug bite 05/06/2014  . Allergic rhinitis 04/09/2014  . Rectal itching 04/09/2014  . Bladder spasm 04/09/2014  . Knee pain, left 03/05/2014  . Hypokalemia 02/19/2014  . Nausea with vomiting 02/19/2014  . Diabetes mellitus, type II ([place]) 02/19/2014  . Edema 02/16/2014  . Disorder of rotator cuff 11/12/2013  . Routine general medical examination at a health care facility 08/20/2013  . GERD (gastroesophageal reflux disease) 06/26/2013  . Dysphagia, pharyngoesophageal phase 06/26/2013  . Suprapubic pain 05/12/2013  . Medication side effect 03/25/2013  . Diarrhea 03/25/2013  . Benign positional vertigo 01/30/2013  . Traumatic hematoma of thigh 01/15/2013  . HTN (hypertension) 09/02/2012  . Dry skin 08/22/2012  . External hemorrhoids 08/22/2012  . Obesity 06/30/2012  . Thyromegaly 05/22/2012  . Back pain 05/22/2012  . Elevated glucose 05/22/2012  . Moderate persistent asthma 04/19/2012  . Dyspnea 01/28/2012    Sumner Boast., PT 12/25/2018, 5:12 PM  [address] [place], [place], [address] Phone: [phone]   Fax:  [phone]  Name: Rhonda Morrow MRN: [id number] Date of Birth: [birth year]

## 2018-12-30 ENCOUNTER — Encounter: Payer: Federal, State, Local not specified - PPO | Admitting: Physical Therapy

## 2019-01-01 ENCOUNTER — Telehealth: Payer: Self-pay

## 2019-01-01 ENCOUNTER — Ambulatory Visit: Payer: Federal, State, Local not specified - PPO | Admitting: Physical Therapy

## 2019-01-01 NOTE — Telephone Encounter (Signed)
Patient was seen in office on 12/11/18 for sx of diarrhea. She was placed on Questran 4gm/dose powder. Patient had interpreter call today stating that she was constipated and wanted to stop taking the medication. Dr Ardis Hughs suggest she stay on the medication but cut the dose in half. Patient will take 2 gm/dose and call to let us know if this is more manageable.

## 2019-01-06 ENCOUNTER — Ambulatory Visit: Payer: Federal, State, Local not specified - PPO | Admitting: Physical Therapy

## 2019-01-06 ENCOUNTER — Telehealth: Payer: Self-pay | Admitting: Family Medicine

## 2019-01-06 ENCOUNTER — Encounter: Payer: Self-pay | Admitting: Family Medicine

## 2019-01-06 ENCOUNTER — Ambulatory Visit (INDEPENDENT_AMBULATORY_CARE_PROVIDER_SITE_OTHER): Payer: Federal, State, Local not specified - PPO | Admitting: Family Medicine

## 2019-01-06 VITALS — BP 124/76 | HR 82 | Temp 97.6°F | Ht 63.5 in | Wt 235.0 lb

## 2019-01-06 DIAGNOSIS — R1013 Epigastric pain: Secondary | ICD-10-CM

## 2019-01-06 MED ORDER — ALUM & MAG HYDROXIDE-SIMETH 400-400-40 MG/5ML PO SUSP
30.0000 mL | Freq: Once | ORAL | Status: AC
  Administered 2019-01-06: 30 mL via ORAL

## 2019-01-06 MED ORDER — LIDOCAINE VISCOUS HCL 2 % MT SOLN
15.0000 mL | Freq: Once | OROMUCOSAL | Status: AC
  Administered 2019-01-06: 15 mL via OROMUCOSAL

## 2019-01-06 MED ORDER — HYOSCYAMINE SULFATE 0.125 MG SL SUBL
0.1250 mg | SUBLINGUAL_TABLET | Freq: Once | SUBLINGUAL | Status: AC
  Administered 2019-01-06: 0.125 mg via SUBLINGUAL

## 2019-01-06 MED ORDER — PANTOPRAZOLE SODIUM 40 MG PO TBEC: 40.0000 mg | DELAYED_RELEASE_TABLET | Freq: Every day | ORAL | 3 refills | Status: DC

## 2019-01-06 NOTE — Patient Instructions (Signed)

## 2019-01-06 NOTE — Progress Notes (Signed)
Patient ID: Rhonda Morrow, female    DOB: 06-07-1961  Age: 58 y.o. MRN: [id number]    Subjective:  Subjective  HPI Rhonda Morrow presents for epigastric pain and constipation .  She thinks it has a lot to do with the cholestyramine powder GI put her on for diarrhea.  She states she only had loose stools 2-3 x a day.  Never more than that .    Review of Systems  Constitutional: Negative for appetite change, diaphoresis, fatigue and unexpected weight change.  Eyes: Negative for pain, redness and visual disturbance.  Respiratory: Negative for cough, chest tightness, shortness of breath and wheezing.   Cardiovascular: Negative for chest pain, palpitations and leg swelling.  Gastrointestinal: Positive for abdominal pain, constipation and nausea. Negative for blood in stool and vomiting.  Endocrine: Negative for cold intolerance, heat intolerance, polydipsia, polyphagia and polyuria.  Genitourinary: Negative for difficulty urinating, dysuria and frequency.  Neurological: Negative for dizziness, light-headedness, numbness and headaches.    History Past Medical History:  Diagnosis Date  . Arthritis   . Asthma   . Complication of anesthesia    one time woke up and was vey scared,17 yrs ago  . Deaf   . Diabetes mellitus without complication ([place])   . GERD (gastroesophageal reflux disease)   . Hypertension   . Left groin pain   . Thyroid disease     She has a past surgical history that includes Cesarean section; Cardiac catheterization; Esophageal manometry (N/A, 09/29/2013); Cholecystectomy (N/A, 06/20/2016); and Total hip arthroplasty (Left, 02/21/2017).   Her family history includes Diabetes in her father and mother; Heart disease in her mother.She reports that she has never smoked. She has never used smokeless tobacco. She reports that she does not drink alcohol or use drugs.  Current Outpatient Medications on File Prior to Visit  Medication Sig Dispense Refill  . albuterol (PROVENTIL  HFA;VENTOLIN HFA) 108 (90 Base) MCG/ACT inhaler Inhale 2 puffs into the lungs every 6 (six) hours as needed for wheezing. 1 Inhaler 5  . allopurinol (ZYLOPRIM) 100 MG tablet TAKE 1 TABLET BY MOUTH EVERY DAY (Patient taking differently: Take 100 mg by mouth daily. ) 90 tablet 2  . amLODipine (NORVASC) 5 MG tablet TAKE 1 TABLET BY MOUTH EVERY DAY 90 tablet 1  . aspirin EC 325 MG tablet Take 1 tablet (325 mg total) by mouth 2 (two) times daily. 84 tablet 0  . azelastine (ASTELIN) 0.1 % nasal spray Place 2 sprays into both nostrils 2 (two) times daily. Use in each nostril as directed 30 mL 3  . budesonide-formoterol (SYMBICORT) 160-4.5 MCG/ACT inhaler Inhale 2 puffs into the lungs every 12 (twelve) hours. 1 Inhaler 0  . cetirizine (ZYRTEC) 10 MG tablet TAKE 1 TABLET BY MOUTH EVERY DAY (Patient taking differently: Take 10 mg by mouth daily. No Therapeutic Substitution) 30 tablet 5  . cholestyramine (QUESTRAN) 4 GM/DOSE powder Take 1 packet (4 g total) by mouth daily for 30 days. 30 packet 3  . colchicine 0.6 MG tablet TAKE 1 TABLET BY MOUTH EVERY DAY 90 tablet 1  . diclofenac (VOLTAREN) 75 MG EC tablet Take 1 tablet (75 mg total) by mouth 2 (two) times daily as needed. 60 tablet 3  . diclofenac sodium (VOLTAREN) 1 % GEL Apply 4 g topically 4 (four) times daily as needed. 500 g 6  . Fiber CHEW Chew 1 tablet by mouth daily.    . fluticasone (FLONASE) 50 MCG/ACT nasal spray PLACE 2 SPRAYS INTO BOTH  NOSTRILS DAILY. 16 g 4  . glucose blood test strip Use as instructed--- one touch verio test strips 100 each 12  . hydrochlorothiazide (HYDRODIURIL) 12.5 MG tablet TAKE 1 TABLET BY MOUTH EVERY DAY (Patient taking differently: Take 25 mg by mouth daily. ) 90 tablet 1  . ipratropium-albuterol (DUONEB) 0.5-2.5 (3) MG/3ML SOLN Take 3 mLs by nebulization every 6 (six) hours as needed. 360 mL 3  . KLOR-CON M20 20 MEQ tablet TAKE 1 TABLET BY MOUTH EVERY DAY 90 tablet 1  . Lancets (ONETOUCH ULTRASOFT) lancets Use as  instructed 100 each 12  . meclizine (ANTIVERT) 12.5 MG tablet Take 1 tablet (12.5 mg total) by mouth 3 (three) times daily as needed for dizziness. 30 tablet 0  . metaxalone (SKELAXIN) 800 MG tablet TAKE 1 TABLET BY MOUTH THREE TIMES A DAY (Patient taking differently: Take 800 mg by mouth 3 (three) times daily. ) 30 tablet 2  . mometasone-formoterol (DULERA) 100-5 MCG/ACT AERO Inhale 2 puffs into the lungs 2 (two) times daily. 1 Inhaler PRN  . montelukast (SINGULAIR) 10 MG tablet Take 1 tablet (10 mg total) by mouth at bedtime. 90 tablet 3  . Multiple Vitamin (MULTIVITAMIN) tablet Take 1 tablet by mouth daily.    . NONFORMULARY OR COMPOUNDED ITEM Nebulizer   DX ASTHMA 1 each 0  . ondansetron (ZOFRAN ODT) 4 MG disintegrating tablet Take 1 tablet (4 mg total) by mouth every 8 (eight) hours as needed for nausea or vomiting. 10 tablet 0  . ranitidine (ZANTAC) 150 MG tablet TAKE 1 TABLET BY MOUTH TWICE A DAY (Patient taking differently: Take 150 mg by mouth 2 (two) times daily. ) 180 tablet 1  . Spacer/Aero-Holding Chambers (AEROCHAMBER MV) inhaler Use as instructed 1 each 0  . zolpidem (AMBIEN) 10 MG tablet TAKE 1 TABLET BY MOUTH AT BEDTIME AS NEEDED FOR SLEEP 15 tablet 1   No current facility-administered medications on file prior to visit.      Objective:  Objective  Physical Exam Vitals signs and nursing note reviewed.  Constitutional:      Appearance: She is well-developed.  HENT:     Head: Normocephalic and atraumatic.  Eyes:     Conjunctiva/sclera: Conjunctivae normal.  Neck:     Musculoskeletal: Normal range of motion and neck supple.     Thyroid: No thyromegaly.     Vascular: No carotid bruit or JVD.  Cardiovascular:     Rate and Rhythm: Normal rate and regular rhythm.     Heart sounds: Normal heart sounds. No murmur.  Pulmonary:     Effort: Pulmonary effort is normal. No respiratory distress.     Breath sounds: Normal breath sounds. No wheezing or rales.  Chest:     Chest  wall: No tenderness.  Abdominal:     Tenderness: There is abdominal tenderness in the epigastric area. There is no guarding or rebound. Negative signs include Murphy's sign, Rovsing's sign, McBurney's sign, psoas sign and obturator sign.  Neurological:     Mental Status: She is alert and oriented to person, place, and time.    BP 124/76 (BP Location: Right Arm, Patient Position: Sitting, Cuff Size: Normal)   Pulse 82   Temp 97.6 F (36.4 C)   Ht 5' 3.5" (1.613 m)   Wt 235 lb (106.6 kg)   LMP 05/20/2012   SpO2 96%   BMI 40.98 kg/m  Wt Readings from Last 3 Encounters:  01/06/19 235 lb (106.6 kg)  12/17/18 237 lb 12.8 oz (107.9  kg)  12/11/18 240 lb 8 oz (109.1 kg)     Lab Results  Component Value Date   WBC 6.1 01/06/2019   HGB 15.0 01/06/2019   HCT 45.1 01/06/2019   PLT 178.0 01/06/2019   GLUCOSE 93 01/06/2019   CHOL 156 03/28/2018   TRIG 71.0 03/28/2018   HDL 56.20 03/28/2018   LDLCALC 85 03/28/2018   ALT 25 01/06/2019   AST 18 01/06/2019   NA 141 01/06/2019   K 3.8 01/06/2019   CL 104 01/06/2019   CREATININE 0.91 01/06/2019   BUN 22 01/06/2019   CO2 29 01/06/2019   TSH 0.87 12/04/2016   INR 1.05 02/12/2017   HGBA1C 6.4 11/08/2018   MICROALBUR 0.8 10/27/2015    Dg Abd 2 Views  Result Date: 11/17/2018 CLINICAL DATA:  Vomiting and abdominal pain since 4 a.m. EXAM: ABDOMEN - 2 VIEW COMPARISON:  CT of the abdomen and pelvis 10/18/2018 FINDINGS: The bowel gas pattern is normal. There is no evidence of free air. No radio-opaque calculi or other significant radiographic abnormality is seen. Surgical clips are present at the gallbladder fossa. Left hip arthroplasty is noted. IMPRESSION: Acute abnormality. Electronically Signed   By: San Morelle M.D.   On: 11/17/2018 13:45     Assessment & Plan:  Plan  I am having Makana A. Stauffer start on pantoprazole. I am also having her maintain her multivitamin, Fiber, NONFORMULARY OR COMPOUNDED ITEM, azelastine, glucose  blood, aspirin EC, albuterol, zolpidem, metaxalone, onetouch ultrasoft, meclizine, ranitidine, allopurinol, hydrochlorothiazide, cetirizine, ondansetron, diclofenac, diclofenac sodium, mometasone-formoterol, montelukast, ipratropium-albuterol, fluticasone, budesonide-formoterol, AeroChamber MV, amLODipine, Klor-Con M20, cholestyramine, and colchicine. We administered alum & mag hydroxide-simeth, lidocaine, and hyoscyamine.  Meds ordered this encounter  Medications  . alum & mag hydroxide-simeth (MAALOX PLUS) 400-400-40 MG/5ML suspension 30 mL  . lidocaine (XYLOCAINE) 2 % viscous mouth solution 15 mL  . hyoscyamine (LEVSIN SL) SL tablet 0.125 mg  . pantoprazole (PROTONIX) 40 MG tablet    Sig: Take 1 tablet (40 mg total) by mouth daily.    Dispense:  30 tablet    Refill:  3    Problem List Items Addressed This Visit    None    Visit Diagnoses    Epigastric pain    -  Primary   Relevant Medications   alum & mag hydroxide-simeth (MAALOX PLUS) 400-400-40 MG/5ML suspension 30 mL (Completed)   lidocaine (XYLOCAINE) 2 % viscous mouth solution 15 mL (Completed)   hyoscyamine (LEVSIN SL) SL tablet 0.125 mg (Completed)   pantoprazole (PROTONIX) 40 MG tablet   Other Relevant Orders   H. pylori antibody, IgG (Completed)   Comprehensive metabolic panel (Completed)   CBC with Differential/Platelet (Completed)    will d/w GI--- pt to hold cholestyramine for now   Follow-up: Return in about 4 months (around 05/08/2019), or if symptoms worsen or fail to improve, for hypertension, hyperlipidemia, diabetes II.  Ann Held, DO

## 2019-01-06 NOTE — Telephone Encounter (Signed)
Copied from [place] [id number]. Topic: Quick Communication - See Telephone Encounter >> Jan 06, 2019  8:29 AM Loma Boston wrote: CRM for notification. See Telephone encounter for: 01/06/19. Appt with Dr Lawson Radar today at @:00 needs interpreter

## 2019-01-07 ENCOUNTER — Ambulatory Visit (INDEPENDENT_AMBULATORY_CARE_PROVIDER_SITE_OTHER): Payer: Federal, State, Local not specified - PPO | Admitting: *Deleted

## 2019-01-07 DIAGNOSIS — E538 Deficiency of other specified B group vitamins: Secondary | ICD-10-CM | POA: Diagnosis not present

## 2019-01-07 LAB — COMPREHENSIVE METABOLIC PANEL
ALT: 25 U/L (ref 0–35)
AST: 18 U/L (ref 0–37)
Albumin: 4 g/dL (ref 3.5–5.2)
Alkaline Phosphatase: 72 U/L (ref 39–117)
BUN: 22 mg/dL (ref 6–23)
CO2: 29 mEq/L (ref 19–32)
Calcium: 9.7 mg/dL (ref 8.4–10.5)
Chloride: 104 mEq/L (ref 96–112)
Creatinine, Ser: 0.91 mg/dL (ref 0.40–1.20)
GFR: 77.57 mL/min (ref >60.00)
Glucose, Bld: 93 mg/dL (ref 70–99)
Potassium: 3.8 mEq/L (ref 3.5–5.1)
Sodium: 141 mEq/L (ref 135–145)
Total Bilirubin: 0.5 mg/dL (ref 0.2–1.2)
Total Protein: 6.5 g/dL (ref 6.0–8.3)

## 2019-01-07 LAB — CBC WITH DIFFERENTIAL/PLATELET
Basophils Absolute: 0.1 10*3/uL (ref 0.0–0.1)
Basophils Relative: 1.2 % (ref 0.0–3.0)
Eosinophils Absolute: 0.1 10*3/uL (ref 0.0–0.7)
Eosinophils Relative: 1.5 % (ref 0.0–5.0)
HCT: 45.1 % (ref 36.0–46.0)
Hemoglobin: 15 g/dL (ref 12.0–15.0)
LYMPHS ABS: 1.9 10*3/uL (ref 0.7–4.0)
LYMPHS PCT: 31.5 % (ref 12.0–46.0)
MCHC: 33.3 g/dL (ref 30.0–36.0)
MCV: 84.9 fl (ref 78.0–100.0)
Monocytes Absolute: 0.7 10*3/uL (ref 0.1–1.0)
Monocytes Relative: 11.5 % (ref 3.0–12.0)
Neutro Abs: 3.3 10*3/uL (ref 1.4–7.7)
Neutrophils Relative %: 54.3 % (ref 43.0–77.0)
Platelets: 178 10*3/uL (ref 150.0–400.0)
RBC: 5.32 Mil/uL — AB (ref 3.87–5.11)
RDW: 14.6 % (ref 11.5–15.5)
WBC: 6.1 10*3/uL (ref 4.0–10.5)

## 2019-01-07 LAB — H. PYLORI ANTIBODY, IGG: H Pylori IgG: NEGATIVE

## 2019-01-07 MED ORDER — CYANOCOBALAMIN 1000 MCG/ML IJ SOLN
1000.0000 ug | Freq: Once | INTRAMUSCULAR | Status: AC
  Administered 2019-01-07: 1000 ug via INTRAMUSCULAR

## 2019-01-07 NOTE — Progress Notes (Signed)
Pt here for monthly B12 injection perDr. Carollee Herter with interpreter.  B12 1075mcg given IMleft deltoid, and pt tolerated injection well.  Next B12 injection scheduled for 02/11/2019

## 2019-01-08 ENCOUNTER — Ambulatory Visit: Payer: Federal, State, Local not specified - PPO | Admitting: Physical Therapy

## 2019-01-08 ENCOUNTER — Other Ambulatory Visit: Payer: Self-pay | Admitting: Family Medicine

## 2019-01-08 ENCOUNTER — Ambulatory Visit: Payer: Federal, State, Local not specified - PPO

## 2019-01-08 DIAGNOSIS — E1165 Type 2 diabetes mellitus with hyperglycemia: Principal | ICD-10-CM

## 2019-01-08 DIAGNOSIS — E1151 Type 2 diabetes mellitus with diabetic peripheral angiopathy without gangrene: Secondary | ICD-10-CM

## 2019-01-08 DIAGNOSIS — IMO0002 Reserved for concepts with insufficient information to code with codable children: Secondary | ICD-10-CM

## 2019-01-09 ENCOUNTER — Other Ambulatory Visit: Payer: Self-pay | Admitting: Family Medicine

## 2019-01-09 MED ORDER — TRAMADOL HCL 50 MG PO TABS: 50.0000 mg | ORAL_TABLET | Freq: Every evening | ORAL | 1 refills | Status: DC | PRN

## 2019-01-09 NOTE — Telephone Encounter (Signed)
done

## 2019-01-09 NOTE — Telephone Encounter (Signed)
Last written: 12/04/18 Last ov: 01/07/19 Next ov: 05/12/19 Contract: none UDS: none

## 2019-01-10 ENCOUNTER — Telehealth: Payer: Self-pay | Admitting: Family Medicine

## 2019-01-10 ENCOUNTER — Encounter: Payer: Self-pay | Admitting: *Deleted

## 2019-01-10 ENCOUNTER — Encounter: Payer: Self-pay | Admitting: Pulmonary Disease

## 2019-01-10 NOTE — Telephone Encounter (Signed)
Patient calling with sign language interpreter and was given the message below. States understanding regarding the poweder medication. Would like to know if Dr Etter Sjogren would like for her to continue with the pantoprazole (PROTONIX) 40 MG tablet? Please advise. Patient would like a call back asap regarding this.

## 2019-01-10 NOTE — Telephone Encounter (Signed)
That needs to be sent to the patient

## 2019-01-10 NOTE — Telephone Encounter (Signed)
This is the message from GI  Timothy Lasso, RN  to Rhonda Morrow, Rhonda Morrow      01/07/19 4:56 PM  You only need to take 1/4 of the dose prescribed, the dose given was to strong for you. You also need Morrow colonoscopy your lower abdominal pains and diarrhea. Please call and get that scheduled at your soonest convenience. Call tomorrow and ask the person that answers the phone to set you up for Morrow colonoscopy so we can find out why you are having diarrhea.     Last read by Rhonda Morrow at 8:24 AM on 01/10/2019

## 2019-01-10 NOTE — Telephone Encounter (Signed)
Copied from [place] [id number]. Topic: General - Inquiry >> Jan 10, 2019  8:22 AM Loma Boston wrote: Medication: **cholestyramine Lucrezia Starch) 4 GM/DOSE powder Pt via interpreter is calling in regards to a conversation she says she had with Dr Lawson Radar on Monday 3/2 that she was supposed to talk to Dr Ardis Hughs regarding this med (powder) in reference to her stomach issues. She says the two dr. were to concur on this script and return the call to her and no one contacted her. She will return call later this afternoon to see what the dr determined her to do. She does required a interpreter on the the line. She is not available most of day she will return call.  *

## 2019-01-10 NOTE — Telephone Encounter (Signed)
Patient called and per Dr. .Etter Sjogren she was advised Dr. Ardis Hughs was going to have someone call her today with instructions. Advised patient to contact his office and to call us back if unable to reach them to see if we can further assist her.

## 2019-01-14 ENCOUNTER — Ambulatory Visit: Payer: Federal, State, Local not specified - PPO | Admitting: Physical Therapy

## 2019-01-14 NOTE — Telephone Encounter (Signed)
No answer/ no vm from interpreter services.

## 2019-01-14 NOTE — Telephone Encounter (Signed)
Yes -- con't and call GI They were supposed to get in touch with the patient

## 2019-01-20 ENCOUNTER — Ambulatory Visit: Payer: Federal, State, Local not specified - PPO | Admitting: Adult Health

## 2019-01-20 ENCOUNTER — Ambulatory Visit: Payer: Federal, State, Local not specified - PPO | Admitting: Nurse Practitioner

## 2019-01-20 ENCOUNTER — Other Ambulatory Visit: Payer: Self-pay | Admitting: Pulmonary Disease

## 2019-01-20 ENCOUNTER — Other Ambulatory Visit: Payer: Self-pay

## 2019-01-20 DIAGNOSIS — R0602 Shortness of breath: Secondary | ICD-10-CM

## 2019-01-21 ENCOUNTER — Ambulatory Visit: Payer: Federal, State, Local not specified - PPO | Attending: Family Medicine | Admitting: Physical Therapy

## 2019-01-21 ENCOUNTER — Encounter: Payer: Self-pay | Admitting: Physical Therapy

## 2019-01-21 DIAGNOSIS — M25611 Stiffness of right shoulder, not elsewhere classified: Secondary | ICD-10-CM | POA: Insufficient documentation

## 2019-01-21 DIAGNOSIS — M25511 Pain in right shoulder: Secondary | ICD-10-CM | POA: Insufficient documentation

## 2019-01-21 NOTE — Therapy (Signed)
[address] [place], [place], [address] Phone: [phone]   Fax:  [phone]  Physical Therapy Treatment  Patient Details  Name: Rhonda Morrow MRN: [id number] Date of Birth: [birth year] Referring Provider (PT): [place]   Encounter Date: 01/21/2019  PT End of Session - 01/21/19 1517    Visit Number  6    Date for PT Re-Evaluation  02/03/19    PT Start Time  1430    PT Stop Time  1529    PT Time Calculation (min)  59 min    Activity Tolerance  Patient tolerated treatment well    Behavior During Therapy  [facility] for tasks assessed/performed       Past Medical History:  Diagnosis Date  . Arthritis   . Asthma   . Complication of anesthesia    one time woke up and was vey scared,17 yrs ago  . Deaf   . Diabetes mellitus without complication ([place])   . GERD (gastroesophageal reflux disease)   . Hypertension   . Left groin pain   . Thyroid disease     Past Surgical History:  Procedure Laterality Date  . CARDIAC CATHETERIZATION    . CESAREAN SECTION    . CHOLECYSTECTOMY N/A 06/20/2016   Procedure: LAPAROSCOPIC CHOLECYSTECTOMY;  Surgeon: Rolm Bookbinder, MD;  Location: [facility];  Service: General;  Laterality: N/A;  . ESOPHAGEAL MANOMETRY N/A 09/29/2013   Procedure: ESOPHAGEAL MANOMETRY (EM);  Surgeon: Milus Banister, MD;  Location: [facility];  Service: Endoscopy;  Laterality: N/A;  . TOTAL HIP ARTHROPLASTY Left 02/21/2017   Procedure: LEFT TOTAL HIP ARTHROPLASTY ANTERIOR APPROACH;  Surgeon: Leandrew Koyanagi, MD;  Location: [facility];  Service: Orthopedics;  Laterality: Left;    There were no vitals filed for this visit.  Subjective Assessment - 01/21/19 1431    Subjective  Pt reports that she has had a lot going on, unable to exercises due to husband having cancer and needing to care for him    Patient is accompained by:  Interpreter    Currently in Pain?  No/denies         [facility] PT Assessment - 01/21/19  0001      AROM   Right Shoulder Flexion  160 Degrees    Right Shoulder ABduction  145 Degrees    Right Shoulder Internal Rotation  40 Degrees    Right Shoulder External Rotation  62 Degrees      Strength   Right Shoulder Flexion  3+/5    Right Shoulder ABduction  4/5    Right Shoulder Internal Rotation  5/5    Right Shoulder External Rotation  4/5                   [facility] Adult PT Treatment/Exercise - 01/21/19 0001      Shoulder Exercises: Standing   External Rotation  Theraband;20 reps;Both    Theraband Level (Shoulder External Rotation)  Level 1 (Yellow)    Internal Rotation  Theraband;20 reps;Both    Theraband Level (Shoulder Internal Rotation)  Level 1 (Yellow)    Flexion  20 reps;Strengthening    Shoulder Flexion Weight (lbs)  2    Shoulder ABduction Weight (lbs)  1    Extension  Theraband;20 reps;Both    Theraband Level (Shoulder Extension)  Level 3 (Green)    Row  [employer];Both    Theraband Level (Shoulder Row)  Level 3 (Green)      Shoulder  Exercises: ROM/Strengthening   UBE (Upper Arm Bike)  level 3.3 3 min fwd, 3 min bkwd    Other ROM/Strengthening Exercises  Rows & Lats 25lb 2x10      Moist Heat Therapy   Number Minutes Moist Heat  15 Minutes    Moist Heat Location  Shoulder      Electrical Stimulation   Electrical Stimulation Location  right shoulder    Electrical Stimulation Action  IFC    Electrical Stimulation Parameters  sitting    Electrical Stimulation Goals  Pain      Manual Therapy   Manual Therapy  Joint mobilization;Passive ROM    Manual therapy comments  Some PROM taken to end range and held    Passive ROM  PROM all directions               PT Short Term Goals - 12/16/18 1515      PT SHORT TERM GOAL #1   Title  Ind with initial HEP    Status  Achieved        PT Long Term Goals - 12/25/18 1712      PT LONG TERM GOAL #1   Title  decreased pain in right shoulder with ADLS by 09% or more to normalize ADLS.     Status  On-going      PT LONG TERM GOAL #2   Title  improved R shoulder ROM to [facility] to perform household chores and to reach behind her back.     Status  On-going      PT LONG TERM GOAL #3   Title  improved right shoulder strength to 4+/5 without pain to normalize ADLS    Status  On-going      PT LONG TERM GOAL #4   Title  Pt able to sleep without waking from pain or N/T    Status  On-going            Plan - 01/21/19 1519    Clinical Impression Statement  Pt returns to therapy after a mont, she reports a lot if things going on in her life. Pt with some set back with R shoulder ROM and strength. She stated that he has not had time to do her exercises. She did well with seated rows and lats, weakness and muscle fatigue with ER, IR, and shoulder flexion. Pain at the end range of passive flexion, but good PROM overall.     Rehab Potential  Good    PT Frequency  2x / week    PT Treatment/Interventions  ADLs/Self Care Home Management;Cryotherapy;Electrical Stimulation;Iontophoresis 4mg /ml Dexamethasone;Moist Heat;Therapeutic exercise;Neuromuscular re-education;Patient/family education;Manual techniques;Dry needling;Taping       Patient will benefit from skilled therapeutic intervention in order to improve the following deficits and impairments:  Pain, Decreased range of motion, Decreased strength, Impaired UE functional use  Visit Diagnosis: Stiffness of right shoulder, not elsewhere classified  Acute pain of right shoulder     Problem List Patient Active Problem List   Diagnosis Date Noted  . RLQ abdominal pain 10/20/2018  . Rectal pain 10/20/2018  . Vitamin B 12 deficiency 03/28/2018  . B12 deficiency 03/28/2018  . DM (diabetes mellitus) type II uncontrolled, periph vascular disorder ([place]) 03/28/2018  . Hyperlipidemia 03/28/2018  . Primary osteoarthritis of left hip 02/21/2017  . 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 History of laparoscopic  cholecystectomy 06/20/2016  . Cerumen impaction 06/09/2016  . Hypersomnia 05/16/2016  . Knee pain, right 05/05/2016  .  RUQ pain 04/09/2016  . Abnormal CT scan 03/28/2016  . Dyspnea and respiratory abnormalities 03/15/2016  . Asthma with acute exacerbation 03/15/2016  . Asthma in adult 02/25/2016  . DM (diabetes mellitus) type II controlled, neurological manifestation ([place]) 02/09/2016  . Left hip pain 12/23/2015  . Right hamstring muscle strain 11/18/2015  . Abdominal pain, acute 09/01/2015  . Acute asthma exacerbation 04/16/2015  . Acute bronchitis 04/14/2015  . Vaginal discharge 09/14/2014  . Pap smear for cervical cancer screening 09/14/2014  . Scabies 05/06/2014  . Bed bug bite 05/06/2014  . Allergic rhinitis 04/09/2014  . Rectal itching 04/09/2014  . Bladder spasm 04/09/2014  . Knee pain, left 03/05/2014  . Hypokalemia 02/19/2014  . Nausea with vomiting 02/19/2014  . Diabetes mellitus, type II ([place]) 02/19/2014  . Edema 02/16/2014  . Disorder of rotator cuff 11/12/2013  . Routine general medical examination at a health care facility 08/20/2013  . GERD (gastroesophageal reflux disease) 06/26/2013  . Dysphagia, pharyngoesophageal phase 06/26/2013  . Suprapubic pain 05/12/2013  . Medication side effect 03/25/2013  . Diarrhea 03/25/2013  . Benign positional vertigo 01/30/2013  . Traumatic hematoma of thigh 01/15/2013  . HTN (hypertension) 09/02/2012  . Dry skin 08/22/2012  . External hemorrhoids 08/22/2012  . Obesity 06/30/2012  . Thyromegaly 05/22/2012  . Back pain 05/22/2012  . Elevated glucose 05/22/2012  . Moderate persistent asthma 04/19/2012  . Dyspnea 01/28/2012    Scot Jun, PTA 01/21/2019, 3:29 PM  [address] [place], [place], [address] Phone: [phone]   Fax:  [phone]  Name: Rhonda Morrow MRN: [id number] Date of Birth: Mar 04, 1961

## 2019-01-22 ENCOUNTER — Encounter: Payer: Self-pay | Admitting: General Surgery

## 2019-01-22 ENCOUNTER — Other Ambulatory Visit: Payer: Self-pay

## 2019-01-22 ENCOUNTER — Ambulatory Visit: Payer: Federal, State, Local not specified - PPO | Admitting: Nurse Practitioner

## 2019-01-22 ENCOUNTER — Ambulatory Visit (INDEPENDENT_AMBULATORY_CARE_PROVIDER_SITE_OTHER): Payer: Federal, State, Local not specified - PPO | Admitting: Pulmonary Disease

## 2019-01-22 ENCOUNTER — Ambulatory Visit (INDEPENDENT_AMBULATORY_CARE_PROVIDER_SITE_OTHER): Payer: Federal, State, Local not specified - PPO | Admitting: Nurse Practitioner

## 2019-01-22 ENCOUNTER — Encounter: Payer: Self-pay | Admitting: Nurse Practitioner

## 2019-01-22 VITALS — BP 122/78 | HR 86 | Ht 62.25 in | Wt 240.0 lb

## 2019-01-22 DIAGNOSIS — J4540 Moderate persistent asthma, uncomplicated: Secondary | ICD-10-CM | POA: Diagnosis not present

## 2019-01-22 DIAGNOSIS — R0602 Shortness of breath: Secondary | ICD-10-CM | POA: Diagnosis not present

## 2019-01-22 DIAGNOSIS — Z6841 Body Mass Index (BMI) 40.0 and over, adult: Secondary | ICD-10-CM | POA: Diagnosis not present

## 2019-01-22 LAB — PULMONARY FUNCTION TEST
DL/VA % pred: 148 %
DL/VA: 6.41 ml/min/mmHg/L
DLCO cor % pred: 87 %
DLCO cor: 17.03 ml/min/mmHg
DLCO unc % pred: 91 %
DLCO unc: 17.81 ml/min/mmHg
FEF 25-75 Post: 2.49 L/sec
FEF 25-75 Pre: 2.36 L/sec
FEF2575-%CHANGE-POST: 5 %
FEF2575-%Pred-Post: 116 %
FEF2575-%Pred-Pre: 110 %
FEV1-%CHANGE-POST: 2 %
FEV1-%Pred-Post: 70 %
FEV1-%Pred-Pre: 69 %
FEV1-Post: 1.45 L
FEV1-Pre: 1.42 L
FEV1FVC-%Change-Post: -1 %
FEV1FVC-%Pred-Pre: 110 %
FEV6-%Change-Post: 3 %
FEV6-%Pred-Post: 66 %
FEV6-%Pred-Pre: 63 %
FEV6-Post: 1.66 L
FEV6-Pre: 1.6 L
FEV6FVC-%Pred-Post: 103 %
FEV6FVC-%Pred-Pre: 103 %
FVC-%Change-Post: 3 %
FVC-%Pred-Post: 63 %
FVC-%Pred-Pre: 61 %
FVC-Post: 1.66 L
FVC-Pre: 1.6 L
PRE FEV6/FVC RATIO: 100 %
Post FEV1/FVC ratio: 88 %
Post FEV6/FVC ratio: 100 %
Pre FEV1/FVC ratio: 89 %
RV % pred: 65 %
RV: 1.18 L
TLC % pred: 65 %
TLC: 3.13 L

## 2019-01-22 NOTE — Patient Instructions (Addendum)
Will place referral for nutrition and weight management Continue Symbicort  Continue Flonase Continue albuterol as needed  Follow up with Dr. Valeta Harms in 3 months or sooner if needed

## 2019-01-22 NOTE — Assessment & Plan Note (Signed)
Referral to weight management placed.

## 2019-01-22 NOTE — Progress Notes (Signed)
@Patient  ID: Rhonda Morrow, female    DOB: 1961-02-13, 58 y.o.   MRN: [id number]  Chief Complaint  Patient presents with  . Results    PFT - Shortness of breath    Referring provider: Ann Held, *  HPI  58 year old female never smoker with asthma followed by Dr. Valeta Harms.  Tests:  CXR 11/17/16 - Mild enlargement of the cardiopericardial silhouette, without edema. Thoracic spondylosis.  PFT Results Latest Ref Rng & Units 01/22/2019 07/21/2013  FVC-Pre L 1.60 -  FVC-Predicted Pre % 61 56  FVC-Post L 1.66 -  FVC-Predicted Post % 63 -  Pre FEV1/FVC % % 89 89  Post FEV1/FCV % % 88 -  FEV1-Pre L 1.42 1.37  FEV1-Predicted Pre % 69 62  FEV1-Post L 1.45 -  DLCO UNC% % 91 -  DLCO COR %Predicted % 148 -  TLC L 3.13 -  TLC % Predicted % 65 -  RV % Predicted % 65 -    OV 01/22/19 - follow up Presents today for routine follow-up.  She did have a PFT today.  She states that she has been doing well since her last visit this is been a stable interval.  Is tolerating Symbicort well.  She uses her albuterol inhaler as needed but only has to use it occasionally.  She is compliant with Flonase.  States that she has been working on weight loss but has not been able to lose any weight so far. Denies f/c/s, n/v/d, hemoptysis, PND, leg swelling.    Allergies  Allergen Reactions  . Losartan Shortness Of Breath  . Oxycodone-Acetaminophen Nausea And Vomiting  . Augmentin [Amoxicillin-Pot Clavulanate] Diarrhea    Immunization History  Administered Date(s) Administered  . DT 04/07/2011  . Influenza Split 08/07/2011, 08/05/2012, 08/22/2012, 08/31/2014  . Influenza,inj,Quad PF,6+ Mos 08/20/2013, 10/22/2015, 08/01/2016, 09/11/2017, 08/06/2018  . Pneumococcal Polysaccharide-23 10/02/2012  . Tdap 12/18/2011    Past Medical History:  Diagnosis Date  . Arthritis   . Asthma   . Complication of anesthesia    one time woke up and was vey scared,17 yrs ago  . Deaf   . Diabetes mellitus  without complication ([place])   . GERD (gastroesophageal reflux disease)   . Hypertension   . Left groin pain   . Thyroid disease     Tobacco History: Social History   Tobacco Use  Smoking Status Never Smoker  Smokeless Tobacco Never Used   Counseling given: Yes   Outpatient Encounter Medications as of 01/22/2019  Medication Sig  . albuterol (PROVENTIL HFA;VENTOLIN HFA) 108 (90 Base) MCG/ACT inhaler Inhale 2 puffs into the lungs every 6 (six) hours as needed for wheezing.  Marland Kitchen allopurinol (ZYLOPRIM) 100 MG tablet TAKE 1 TABLET BY MOUTH EVERY DAY (Patient taking differently: Take 100 mg by mouth daily. )  . amLODipine (NORVASC) 5 MG tablet TAKE 1 TABLET BY MOUTH EVERY DAY  . aspirin EC 325 MG tablet Take 1 tablet (325 mg total) by mouth 2 (two) times daily.  Marland Kitchen azelastine (ASTELIN) 0.1 % nasal spray Place 2 sprays into both nostrils 2 (two) times daily. Use in each nostril as directed  . budesonide-formoterol (SYMBICORT) 160-4.5 MCG/ACT inhaler Inhale 2 puffs into the lungs every 12 (twelve) hours.  . cetirizine (ZYRTEC) 10 MG tablet TAKE 1 TABLET BY MOUTH EVERY DAY (Patient taking differently: Take 10 mg by mouth daily. No Therapeutic Substitution)  . cholestyramine (QUESTRAN) 4 GM/DOSE powder Take 1 packet (4 g total) by mouth daily for  30 days.  . colchicine 0.6 MG tablet TAKE 1 TABLET BY MOUTH EVERY DAY  . diclofenac (VOLTAREN) 75 MG EC tablet Take 1 tablet (75 mg total) by mouth 2 (two) times daily as needed.  . diclofenac sodium (VOLTAREN) 1 % GEL Apply 4 g topically 4 (four) times daily as needed.  . Fiber CHEW Chew 1 tablet by mouth daily.  . fluticasone (FLONASE) 50 MCG/ACT nasal spray PLACE 2 SPRAYS INTO BOTH NOSTRILS DAILY.  Marland Kitchen glucose blood test strip Use as instructed--- one touch verio test strips  . hydrochlorothiazide (HYDRODIURIL) 12.5 MG tablet TAKE 1 TABLET BY MOUTH EVERY DAY (Patient taking differently: Take 25 mg by mouth daily. )  . ipratropium-albuterol (DUONEB)  0.5-2.5 (3) MG/3ML SOLN Take 3 mLs by nebulization every 6 (six) hours as needed.  Marland Kitchen KLOR-CON M20 20 MEQ tablet TAKE 1 TABLET BY MOUTH EVERY DAY  . Lancets (ONETOUCH ULTRASOFT) lancets Use as instructed  . meclizine (ANTIVERT) 12.5 MG tablet Take 1 tablet (12.5 mg total) by mouth 3 (three) times daily as needed for dizziness.  . metaxalone (SKELAXIN) 800 MG tablet TAKE 1 TABLET BY MOUTH THREE TIMES A DAY (Patient taking differently: Take 800 mg by mouth 3 (three) times daily. )  . metFORMIN (GLUCOPHAGE-XR) 500 MG 24 hr tablet TAKE 1 TABLET BY MOUTH EVERY DAY WITH BREAKFAST  . mometasone-formoterol (DULERA) 100-5 MCG/ACT AERO Inhale 2 puffs into the lungs 2 (two) times daily.  . montelukast (SINGULAIR) 10 MG tablet Take 1 tablet (10 mg total) by mouth at bedtime.  . Multiple Vitamin (MULTIVITAMIN) tablet Take 1 tablet by mouth daily.  . NONFORMULARY OR COMPOUNDED ITEM Nebulizer   DX ASTHMA  . ondansetron (ZOFRAN ODT) 4 MG disintegrating tablet Take 1 tablet (4 mg total) by mouth every 8 (eight) hours as needed for nausea or vomiting.  . pantoprazole (PROTONIX) 40 MG tablet Take 1 tablet (40 mg total) by mouth daily.  . ranitidine (ZANTAC) 150 MG tablet TAKE 1 TABLET BY MOUTH TWICE A DAY (Patient taking differently: Take 150 mg by mouth 2 (two) times daily. )  . Spacer/Aero-Holding Chambers (AEROCHAMBER MV) inhaler Use as instructed  . traMADol (ULTRAM) 50 MG tablet Take 1-2 tablets (50-100 mg total) by mouth at bedtime as needed.  . zolpidem (AMBIEN) 10 MG tablet TAKE 1 TABLET BY MOUTH AT BEDTIME AS NEEDED FOR SLEEP   No facility-administered encounter medications on file as of 01/22/2019.      Review of Systems  Review of Systems  Constitutional: Negative.  Negative for chills and fever.  HENT: Negative.   Respiratory: Negative for cough, shortness of breath and wheezing.   Cardiovascular: Negative.  Negative for chest pain, palpitations and leg swelling.  Gastrointestinal: Negative.    Allergic/Immunologic: Negative.   Neurological: Negative.   Psychiatric/Behavioral: Negative.        Physical Exam  BP 122/78 (BP Location: Left Arm, Patient Position: Sitting, Cuff Size: Normal)   Pulse 86   Ht 5' 2.25" (1.581 m)   Wt 240 lb (108.9 kg)   LMP 05/20/2012   SpO2 99%   BMI 43.54 kg/m   Wt Readings from Last 5 Encounters:  01/22/19 240 lb (108.9 kg)  01/06/19 235 lb (106.6 kg)  12/17/18 237 lb 12.8 oz (107.9 kg)  12/11/18 240 lb 8 oz (109.1 kg)  11/26/18 240 lb 6.4 oz (109 kg)     Physical Exam Vitals signs and nursing note reviewed.  Constitutional:      General: She is  not in acute distress.    Appearance: She is well-developed.  Cardiovascular:     Rate and Rhythm: Normal rate and regular rhythm.  Pulmonary:     Effort: Pulmonary effort is normal. No respiratory distress.     Breath sounds: Normal breath sounds. No wheezing or rhonchi.  Musculoskeletal:        General: No swelling.  Neurological:     Mental Status: She is alert and oriented to person, place, and time.       Assessment & Plan:   Moderate persistent asthma Patient presents for routine follow-up today with PFT.  She states that Symbicort has been working well for her.  We discussed the results of PFT.  PFT did show restriction with normal DLCO.  Discussed that restriction is most likely due to patient's weight.  We will refer her for weight management.  She would like to lose weight and is willing to try this.  She has tried weight loss on her own but has failed.  Patient Instructions  Will place referral for nutrition and weight management Continue Symbicort  Continue Flonase Continue albuterol as needed  Follow up with Dr. Valeta Harms in 3 months or sooner if needed     BMI 40.0-44.9, adult [facility]) Referral to weight management placed.     Fenton Foy, NP 01/22/2019

## 2019-01-22 NOTE — Assessment & Plan Note (Addendum)
Patient presents for routine follow-up today with PFT.  She states that Symbicort has been working well for her.  We discussed the results of PFT.  PFT did show restriction with normal DLCO.  Discussed that restriction is most likely due to patient's weight.  We will refer her for weight management.  She would like to lose weight and is willing to try this.  She has tried weight loss on her own but has failed.  Patient Instructions  Will place referral for nutrition and weight management Continue Symbicort  Continue Flonase Continue albuterol as needed  Follow up with Dr. Valeta Harms in 3 months or sooner if needed

## 2019-01-22 NOTE — Telephone Encounter (Signed)
Left message to call back  

## 2019-01-22 NOTE — Progress Notes (Signed)
PCCM: Agreed. Thanks for seeing   Garner Nash, DO [facility] 01/22/2019 5:26 PM

## 2019-01-23 ENCOUNTER — Ambulatory Visit: Payer: Federal, State, Local not specified - PPO | Admitting: Nurse Practitioner

## 2019-01-23 ENCOUNTER — Telehealth: Payer: Self-pay | Admitting: Family Medicine

## 2019-01-23 NOTE — Progress Notes (Signed)
PFT done today. 

## 2019-01-23 NOTE — Telephone Encounter (Signed)
Patient came into the office to drop off FMLA forms to be completed for her and her husband. Requested we fax the completed copy and inform patient when ready to pick up.

## 2019-01-27 ENCOUNTER — Encounter: Payer: Federal, State, Local not specified - PPO | Admitting: Physical Therapy

## 2019-01-28 ENCOUNTER — Telehealth: Payer: Self-pay | Admitting: Gastroenterology

## 2019-01-28 NOTE — Telephone Encounter (Signed)
Received FMLA/STD paperwork from [place], will complete as much as possible/SLS 03/24

## 2019-01-28 NOTE — Telephone Encounter (Signed)
The pt was advised that her appt will be over the phone at the same time as her in office appt.

## 2019-01-28 NOTE — Telephone Encounter (Signed)
Pt is scheduled for tomorrow.. She would like to come in I did give her the option of a virtual visit.Marland KitchenMarland Kitchen

## 2019-01-29 ENCOUNTER — Telehealth: Payer: Federal, State, Local not specified - PPO | Admitting: Gastroenterology

## 2019-01-29 ENCOUNTER — Other Ambulatory Visit: Payer: Self-pay

## 2019-01-29 NOTE — Progress Notes (Signed)
    Rhonda Morrow was scheduled for an office visit today.  With the pandemic coronavirus we changed this to a "virtual visit".  She is deaf.  I called her sign language interpreter who called her through the usual phone which she communicates with and there was no answer.  I left a message telling her to call the office, I was calling to check in.

## 2019-01-31 ENCOUNTER — Ambulatory Visit (INDEPENDENT_AMBULATORY_CARE_PROVIDER_SITE_OTHER): Payer: Federal, State, Local not specified - PPO | Admitting: Gastroenterology

## 2019-01-31 ENCOUNTER — Other Ambulatory Visit: Payer: Self-pay

## 2019-01-31 ENCOUNTER — Telehealth: Payer: Self-pay | Admitting: Gastroenterology

## 2019-01-31 DIAGNOSIS — K5900 Constipation, unspecified: Secondary | ICD-10-CM

## 2019-01-31 DIAGNOSIS — R197 Diarrhea, unspecified: Secondary | ICD-10-CM

## 2019-01-31 NOTE — Patient Instructions (Signed)
Please return to see Dr. Ardis Hughs in 2-3 months, face to face visit.  She knows to continue her current bowel regimen with daily laxative.  Work note for abdominal pains that caused you to miss work (one time only) on  Sunday March 22nd.  Office to arrange to get that note into her hands

## 2019-01-31 NOTE — Telephone Encounter (Signed)
Rhonda Morrow I think you are working on this from her visit today.

## 2019-01-31 NOTE — Progress Notes (Signed)
Review of pertinent gastrointestinal problems: 1.  Dysphasia, EGD November 2014, Dr. Ardis Hughs was completely normal.  High resolution esophageal manometry December 2014 was interpreted as normal. 2.  Routine risk for colon cancer.  Colonoscopy November 2014 Dr. Ardis Hughs for routine risk screening found a single subcentimeter polyp.  It was not adenomatous.  She was recommended to have repeat colonoscopy at 10-year interval.    This service was provided via telemedicine.  The patient was located at home.  I was located in my office.  The patient did consent to this telephone visit and is aware of possible charges through their insurance for this visit.  She is an established patient.  She is deaf and so there was a professional sign language interpreter also on the phone who assisted with the visit  Time spent on call: 17 min   HPI: This is a 58 year old woman  She was last here in the office a month or 2 ago.  She had issues with loose stools.  A GI pathogen panel was negative.  Since her gallbladder had been removed 2 to 3 years ago I decided to try her on cholestyramine trial.  It worked to effectively actually.  She became significantly constipated.  Then she started taking a laxative instead, dulcolax 2 pills daily.  She is happy with her bowels.    She still has some mild intermittent lower abdominal pains  She had some severe lower abdominal pains on Sunday and she tells me it caused her to miss work, she asked for a work note for it.  She is not having any weight loss.  She is not seeing any blood in her stools   ROS: complete GI ROS as described in HPI, all other review negative.  Constitutional:  No unintentional weight loss   Past Medical History:  Diagnosis Date  . Arthritis   . Asthma   . Complication of anesthesia    one time woke up and was vey scared,17 yrs ago  . Deaf   . Diabetes mellitus without complication ([place])   . GERD (gastroesophageal reflux disease)   .  Hypertension   . Left groin pain   . Thyroid disease     Past Surgical History:  Procedure Laterality Date  . CARDIAC CATHETERIZATION    . CESAREAN SECTION    . CHOLECYSTECTOMY N/A 06/20/2016   Procedure: LAPAROSCOPIC CHOLECYSTECTOMY;  Surgeon: Rolm Bookbinder, MD;  Location: [facility];  Service: General;  Laterality: N/A;  . ESOPHAGEAL MANOMETRY N/A 09/29/2013   Procedure: ESOPHAGEAL MANOMETRY (EM);  Surgeon: Milus Banister, MD;  Location: [facility];  Service: Endoscopy;  Laterality: N/A;  . TOTAL HIP ARTHROPLASTY Left 02/21/2017   Procedure: LEFT TOTAL HIP ARTHROPLASTY ANTERIOR APPROACH;  Surgeon: Leandrew Koyanagi, MD;  Location: [facility];  Service: Orthopedics;  Laterality: Left;    Current Outpatient Medications  Medication Sig Dispense Refill  . albuterol (PROVENTIL HFA;VENTOLIN HFA) 108 (90 Base) MCG/ACT inhaler Inhale 2 puffs into the lungs every 6 (six) hours as needed for wheezing. 1 Inhaler 5  . allopurinol (ZYLOPRIM) 100 MG tablet TAKE 1 TABLET BY MOUTH EVERY DAY (Patient taking differently: Take 100 mg by mouth daily. ) 90 tablet 2  . amLODipine (NORVASC) 5 MG tablet TAKE 1 TABLET BY MOUTH EVERY DAY 90 tablet 1  . aspirin EC 325 MG tablet Take 1 tablet (325 mg total) by mouth 2 (two) times daily. 84 tablet 0  . azelastine (ASTELIN) 0.1 % nasal spray Place 2 sprays  into both nostrils 2 (two) times daily. Use in each nostril as directed 30 mL 3  . budesonide-formoterol (SYMBICORT) 160-4.5 MCG/ACT inhaler Inhale 2 puffs into the lungs every 12 (twelve) hours. 1 Inhaler 0  . cetirizine (ZYRTEC) 10 MG tablet TAKE 1 TABLET BY MOUTH EVERY DAY (Patient taking differently: Take 10 mg by mouth daily. No Therapeutic Substitution) 30 tablet 5  . cholestyramine (QUESTRAN) 4 GM/DOSE powder Take 1 packet (4 g total) by mouth daily for 30 days. 30 packet 3  . colchicine 0.6 MG tablet TAKE 1 TABLET BY MOUTH EVERY DAY 90 tablet 1  . diclofenac (VOLTAREN) 75 MG EC tablet Take 1 tablet (75 mg total) by  mouth 2 (two) times daily as needed. 60 tablet 3  . diclofenac sodium (VOLTAREN) 1 % GEL Apply 4 g topically 4 (four) times daily as needed. 500 g 6  . Fiber CHEW Chew 1 tablet by mouth daily.    . fluticasone (FLONASE) 50 MCG/ACT nasal spray PLACE 2 SPRAYS INTO BOTH NOSTRILS DAILY. 16 g 4  . glucose blood test strip Use as instructed--- one touch verio test strips 100 each 12  . hydrochlorothiazide (HYDRODIURIL) 12.5 MG tablet TAKE 1 TABLET BY MOUTH EVERY DAY (Patient taking differently: Take 25 mg by mouth daily. ) 90 tablet 1  . ipratropium-albuterol (DUONEB) 0.5-2.5 (3) MG/3ML SOLN Take 3 mLs by nebulization every 6 (six) hours as needed. 360 mL 3  . KLOR-CON M20 20 MEQ tablet TAKE 1 TABLET BY MOUTH EVERY DAY 90 tablet 1  . Lancets (ONETOUCH ULTRASOFT) lancets Use as instructed 100 each 12  . meclizine (ANTIVERT) 12.5 MG tablet Take 1 tablet (12.5 mg total) by mouth 3 (three) times daily as needed for dizziness. 30 tablet 0  . metaxalone (SKELAXIN) 800 MG tablet TAKE 1 TABLET BY MOUTH THREE TIMES A DAY (Patient taking differently: Take 800 mg by mouth 3 (three) times daily. ) 30 tablet 2  . metFORMIN (GLUCOPHAGE-XR) 500 MG 24 hr tablet TAKE 1 TABLET BY MOUTH EVERY DAY WITH BREAKFAST 90 tablet 1  . mometasone-formoterol (DULERA) 100-5 MCG/ACT AERO Inhale 2 puffs into the lungs 2 (two) times daily. 1 Inhaler PRN  . montelukast (SINGULAIR) 10 MG tablet Take 1 tablet (10 mg total) by mouth at bedtime. 90 tablet 3  . Multiple Vitamin (MULTIVITAMIN) tablet Take 1 tablet by mouth daily.    . NONFORMULARY OR COMPOUNDED ITEM Nebulizer   DX ASTHMA 1 each 0  . ondansetron (ZOFRAN ODT) 4 MG disintegrating tablet Take 1 tablet (4 mg total) by mouth every 8 (eight) hours as needed for nausea or vomiting. 10 tablet 0  . pantoprazole (PROTONIX) 40 MG tablet Take 1 tablet (40 mg total) by mouth daily. 30 tablet 3  . ranitidine (ZANTAC) 150 MG tablet TAKE 1 TABLET BY MOUTH TWICE A DAY (Patient taking  differently: Take 150 mg by mouth 2 (two) times daily. ) 180 tablet 1  . Spacer/Aero-Holding Chambers (AEROCHAMBER MV) inhaler Use as instructed 1 each 0  . traMADol (ULTRAM) 50 MG tablet Take 1-2 tablets (50-100 mg total) by mouth at bedtime as needed. 14 tablet 1  . zolpidem (AMBIEN) 10 MG tablet TAKE 1 TABLET BY MOUTH AT BEDTIME AS NEEDED FOR SLEEP 15 tablet 1   No current facility-administered medications for this visit.     Allergies as of 01/31/2019 - Review Complete 01/22/2019  Allergen Reaction Noted  . Losartan Shortness Of Breath 10/26/2014  . Oxycodone-acetaminophen Nausea And Vomiting 07/23/2013  .  Augmentin [amoxicillin-pot clavulanate] Diarrhea 03/25/2013    Family History  Problem Relation Age of Onset  . Diabetes Mother   . Heart disease Mother   . Diabetes Father     Social History   Socioeconomic History  . Marital status: Married    Spouse name: Not on file  . Number of children: Not on file  . Years of education: Not on file  . Highest education level: Not on file  Occupational History  . Occupation: disabled  Social Needs  . Financial resource strain: Not on file  . Food insecurity:    Worry: Not on file    Inability: Not on file  . Transportation needs:    Medical: Not on file    Non-medical: Not on file  Tobacco Use  . Smoking status: Never Smoker  . Smokeless tobacco: Never Used  Substance and Sexual Activity  . Alcohol use: No    Alcohol/week: 0.0 standard drinks  . Drug use: No  . Sexual activity: Never  Lifestyle  . Physical activity:    Days per week: Not on file    Minutes per session: Not on file  . Stress: Not on file  Relationships  . Social connections:    Talks on phone: Not on file    Gets together: Not on file    Attends religious service: Not on file    Active member of club or organization: Not on file    Attends meetings of clubs or organizations: Not on file    Relationship status: Not on file  . Intimate partner  violence:    Fear of current or ex partner: Not on file    Emotionally abused: Not on file    Physically abused: Not on file    Forced sexual activity: Not on file  Other Topics Concern  . Not on file  Social History Narrative   Lives with family.  Has 3 children.  Works for [employer].  Education: high school.     Physical Exam: Unable to perform because this was a "telemed visit" due to current Covid-19 pandemic  Assessment and plan: 58 y.o. female with altered bowels  She was previously having chronic loose stools, GI pathogen panel was negative.  I recommended cholestyramine which was effective and actually a bit too powerful for her.  She became constipated.  She is now on a daily Dulcolax regimen.  She is happy with her results.  She has no overt GI bleeding, she has no weight loss.  She will return to see me here in the office for a face-to-face visit in 2 to 3 months.  We will give her a work note for her Sunday abdominal pains.  Please see the "Patient Instructions" section for addition details about the plan.  Owens Loffler, MD [facility] 01/31/2019, 8:33 AM

## 2019-01-31 NOTE — Telephone Encounter (Signed)
Notified patient. She will print off her work note through [employer]

## 2019-01-31 NOTE — Telephone Encounter (Signed)
Pt called and stated she just spoke with the provider and was told that the nurse will be witting her a letter for work and posting it to [employer] so that she can print off and take to work. She is calling to make sure that the nurse recv the msg from the doctor.

## 2019-02-03 ENCOUNTER — Encounter: Payer: Federal, State, Local not specified - PPO | Admitting: Physical Therapy

## 2019-02-04 NOTE — Telephone Encounter (Signed)
Pt called, thru sign language interpreter, to follow up on her FMLA paperwork. Please advise with status.

## 2019-02-05 NOTE — Telephone Encounter (Signed)
FMLA paperwork completed as much as possible; forwarded to provider/SLS 04/01

## 2019-02-07 DIAGNOSIS — Z0279 Encounter for issue of other medical certificate: Secondary | ICD-10-CM

## 2019-02-11 ENCOUNTER — Ambulatory Visit (INDEPENDENT_AMBULATORY_CARE_PROVIDER_SITE_OTHER): Payer: Federal, State, Local not specified - PPO

## 2019-02-11 ENCOUNTER — Other Ambulatory Visit: Payer: Self-pay

## 2019-02-11 ENCOUNTER — Other Ambulatory Visit: Payer: Self-pay | Admitting: *Deleted

## 2019-02-11 ENCOUNTER — Other Ambulatory Visit: Payer: Federal, State, Local not specified - PPO

## 2019-02-11 DIAGNOSIS — Z79899 Other long term (current) drug therapy: Secondary | ICD-10-CM

## 2019-02-11 DIAGNOSIS — E538 Deficiency of other specified B group vitamins: Secondary | ICD-10-CM | POA: Diagnosis not present

## 2019-02-11 DIAGNOSIS — M25511 Pain in right shoulder: Secondary | ICD-10-CM

## 2019-02-11 MED ORDER — CYANOCOBALAMIN 1000 MCG/ML IJ SOLN
1000.0000 ug | Freq: Once | INTRAMUSCULAR | Status: AC
  Administered 2019-02-11: 1000 ug via INTRAMUSCULAR

## 2019-02-11 NOTE — Progress Notes (Signed)
Pre visit review using our clinic tool,if applicable. No additional management support is needed unless otherwise documented below in the visit note.   .Pt here for monthly B12 injection per order from Dr. Carollee Herter.  B12 1081mcg given IM, and pt tolerated injection well.  Next B12 injection not scheduled. Patient agreed to take sublingual tablets daily per order from Dr. Carollee Herter to prevent patient from having to come out due to Coronavirus.   Will call medication in to pharmacy.

## 2019-02-13 LAB — PAIN MGMT, PROFILE 8 W/CONF, U
6 Acetylmorphine: NEGATIVE ng/mL (ref <10)
Alcohol Metabolites: NEGATIVE ng/mL (ref <500)
Amphetamines: NEGATIVE ng/mL (ref <500)
Benzodiazepines: NEGATIVE ng/mL (ref <100)
Buprenorphine, Urine: NEGATIVE ng/mL (ref <5)
Cocaine Metabolite: NEGATIVE ng/mL (ref <150)
Creatinine: >350 mg/dL
MDMA: NEGATIVE ng/mL (ref <500)
Marijuana Metabolite: NEGATIVE ng/mL (ref <20)
Opiates: NEGATIVE ng/mL (ref <100)
Oxidant: NEGATIVE ug/mL (ref <200)
Oxycodone: NEGATIVE ng/mL (ref <100)
pH: 6.15 (ref 4.5–9.0)

## 2019-02-13 LAB — PAIN MGMT, TRAMADOL W/MEDMATCH, U
Desmethyltramadol: 1587 ng/mL — ABNORMAL HIGH (ref <100)
Tramadol: 2715 ng/mL — ABNORMAL HIGH (ref <100)

## 2019-02-18 ENCOUNTER — Ambulatory Visit: Payer: Federal, State, Local not specified - PPO | Admitting: Physical Therapy

## 2019-02-24 ENCOUNTER — Ambulatory Visit: Payer: Federal, State, Local not specified - PPO | Attending: Family Medicine | Admitting: Physical Therapy

## 2019-02-24 ENCOUNTER — Other Ambulatory Visit: Payer: Self-pay

## 2019-02-24 ENCOUNTER — Encounter: Payer: Self-pay | Admitting: Physical Therapy

## 2019-02-24 DIAGNOSIS — M25611 Stiffness of right shoulder, not elsewhere classified: Secondary | ICD-10-CM | POA: Diagnosis not present

## 2019-02-24 DIAGNOSIS — M25511 Pain in right shoulder: Secondary | ICD-10-CM | POA: Insufficient documentation

## 2019-02-24 NOTE — Therapy (Signed)
[address] [place], [place], [address] Phone: [phone]   Fax:  [phone]  Physical Therapy Treatment  Patient Details  Name: Rhonda Morrow MRN: [id number] Date of Birth: 07/05/1961 Referring Provider (PT): Hilts   Encounter Date: 02/24/2019  PT End of Session - 02/24/19 1350    Visit Number  7    Date for PT Re-Evaluation  02/03/19    PT Start Time  3614    PT Stop Time  1405    PT Time Calculation (min)  60 min    Activity Tolerance  Patient tolerated treatment well    Behavior During Therapy  [facility] for tasks assessed/performed       Past Medical History:  Diagnosis Date  . Arthritis   . Asthma   . Complication of anesthesia    one time woke up and was vey scared,17 yrs ago  . Deaf   . Diabetes mellitus without complication ([place])   . GERD (gastroesophageal reflux disease)   . Hypertension   . Left groin pain   . Thyroid disease     Past Surgical History:  Procedure Laterality Date  . CARDIAC CATHETERIZATION    . CESAREAN SECTION    . CHOLECYSTECTOMY N/A 06/20/2016   Procedure: LAPAROSCOPIC CHOLECYSTECTOMY;  Surgeon: Rolm Bookbinder, MD;  Location: [place];  Service: General;  Laterality: N/A;  . ESOPHAGEAL MANOMETRY N/A 09/29/2013   Procedure: ESOPHAGEAL MANOMETRY (EM);  Surgeon: Milus Banister, MD;  [facility];  Service: Endoscopy;  Laterality: N/A;  . TOTAL HIP ARTHROPLASTY Left 02/21/2017   Procedure: LEFT TOTAL HIP ARTHROPLASTY ANTERIOR APPROACH;  Surgeon: Leandrew Koyanagi, MD;  Location: [place];  Service: Orthopedics;  Laterality: Left;    There were no vitals filed for this visit.  Subjective Assessment - 02/24/19 1306    Subjective  Pt reports that she has had some pain in her shoulder. She stated that she has been doing a lot of pushing and pulling at work causing pain. Needs breaks at times. Does get busy at work sometimes and forgets to take it easy.    Patient is  accompained by:  Interpreter    Currently in Pain?  Yes    Pain Score  3     Pain Location  Shoulder    Pain Orientation  Right         [facility] PT Assessment - 02/24/19 0001      AROM   Right Shoulder Flexion  130 Degrees    Right Shoulder ABduction  92 Degrees    Right Shoulder Internal Rotation  40 Degrees    Right Shoulder External Rotation  62 Degrees      Strength   Right Shoulder Flexion  3+/5    Right Shoulder ABduction  4/5    Right Shoulder Internal Rotation  5/5    Right Shoulder External Rotation  4/5                   [facility] Adult PT Treatment/Exercise - 02/24/19 0001      Shoulder Exercises: Standing   External Rotation  Theraband;20 reps;Both    Theraband Level (Shoulder External Rotation)  Level 1 (Yellow)    Extension  Theraband;Both;15 reps   x2   Theraband Level (Shoulder Extension)  Level 2 (Red)    Row  Theraband;Both;15 reps   x2   Theraband Level (Shoulder Row)  Level 2 (Red)    Other Standing  Exercises  # level cabinet reaches flex 2lb x10, abduction No weight 2 level and x 10 RLE      Shoulder Exercises: ROM/Strengthening   UBE (Upper Arm Bike)  level 2 3 min fwd, 3 min bkwd      Moist Heat Therapy   Number Minutes Moist Heat  15 Minutes    Moist Heat Location  Shoulder      Electrical Stimulation   Electrical Stimulation Location  right shoulder    Electrical Stimulation Action  IFC    Electrical Stimulation Parameters  sitting    Electrical Stimulation Goals  Pain      Manual Therapy   Manual Therapy  Joint mobilization;Passive ROM    Manual therapy comments  Some PROM taken to end range and held    Passive ROM  PROM all directions               PT Short Term Goals - 12/16/18 1515      PT SHORT TERM GOAL #1   Title  Ind with initial HEP    Status  Achieved        PT Long Term Goals - 12/25/18 1712      PT LONG TERM GOAL #1   Title  decreased pain in right shoulder with ADLS by 75% or more to normalize  ADLS.    Status  On-going      PT LONG TERM GOAL #2   Title  improved R shoulder ROM to [facility] to perform household chores and to reach behind her back.     Status  On-going      PT LONG TERM GOAL #3   Title  improved right shoulder strength to 4+/5 without pain to normalize ADLS    Status  On-going      PT LONG TERM GOAL #4   Title  Pt able to sleep without waking from pain or N/T    Status  On-going            Plan - 02/24/19 1350    Clinical Impression Statement  Pt returns to therapy after 1 month due to covid  limitations. She has had a significant decrease in R shoulder flexion and abduction. Her Strength has pretty much remained the same since last therapy session. Some pain and weakness noted with 3 level cabinet reaches. She was able to complete all the other exercises without issue. Tactile cues's to maintain good shoulder placement with R shoulder ER.    PT Treatment/Interventions  ADLs/Self Care Home Management;Cryotherapy;Electrical Stimulation;Iontophoresis 4mg /ml Dexamethasone;Moist Heat;Therapeutic exercise;Neuromuscular re-education;Patient/family education;Manual techniques;Dry needling;Taping    PT Next Visit Plan  R shoulder strength and ROM.        Patient will benefit from skilled therapeutic intervention in order to improve the following deficits and impairments:  Pain, Decreased range of motion, Decreased strength, Impaired UE functional use  Visit Diagnosis: Stiffness of right shoulder, not elsewhere classified  Acute pain of right shoulder     Problem List Patient Active Problem List   Diagnosis Date Noted  . BMI 40.0-44.9, adult ([place]) 01/22/2019  . RLQ abdominal pain 10/20/2018  . Rectal pain 10/20/2018  . Vitamin B 12 deficiency 03/28/2018  . B12 deficiency 03/28/2018  . DM (diabetes mellitus) type II uncontrolled, periph vascular disorder ([place]) 03/28/2018  . Hyperlipidemia 03/28/2018  . Primary osteoarthritis of left hip 02/21/2017  . Status  post total hip replacement, left 02/21/2017  . History of laparoscopic cholecystectomy 06/20/2016  . Cerumen impaction  06/09/2016  . Hypersomnia 05/16/2016  . Knee pain, right 05/05/2016  . RUQ pain 04/09/2016  . Abnormal CT scan 03/28/2016  . Dyspnea and respiratory abnormalities 03/15/2016  . Asthma with acute exacerbation 03/15/2016  . Asthma in adult 02/25/2016  . DM (diabetes mellitus) type II controlled, neurological manifestation ([place]) 02/09/2016  . Left hip pain 12/23/2015  . Right hamstring muscle strain 11/18/2015  . Abdominal pain, acute 09/01/2015  . Acute asthma exacerbation 04/16/2015  . Acute bronchitis 04/14/2015  . Vaginal discharge 09/14/2014  . Pap smear for cervical cancer screening 09/14/2014  . Scabies 05/06/2014  . Bed bug bite 05/06/2014  . Allergic rhinitis 04/09/2014  . Rectal itching 04/09/2014  . Bladder spasm 04/09/2014  . Knee pain, left 03/05/2014  . Hypokalemia 02/19/2014  . Nausea with vomiting 02/19/2014  . Diabetes mellitus, type II ([place]) 02/19/2014  . Edema 02/16/2014  . Disorder of rotator cuff 11/12/2013  . Routine general medical examination at a health care facility 08/20/2013  . GERD (gastroesophageal reflux disease) 06/26/2013  . Dysphagia, pharyngoesophageal phase 06/26/2013  . Suprapubic pain 05/12/2013  . Medication side effect 03/25/2013  . Diarrhea 03/25/2013  . Benign positional vertigo 01/30/2013  . Traumatic hematoma of thigh 01/15/2013  . HTN (hypertension) 09/02/2012  . Dry skin 08/22/2012  . External hemorrhoids 08/22/2012  . Obesity 06/30/2012  . Thyromegaly 05/22/2012  . Back pain 05/22/2012  . Elevated glucose 05/22/2012  . Moderate persistent asthma 04/19/2012  . Dyspnea 01/28/2012    Rhonda Morrow, Rhonda Morrow 02/24/2019, 1:59 PM  [address] [place], [place], [address] Phone: [phone]   Fax:  [phone]  Name: Rhonda Morrow MRN:  [id number] Date of Birth: 1961-07-01

## 2019-02-25 ENCOUNTER — Telehealth (INDEPENDENT_AMBULATORY_CARE_PROVIDER_SITE_OTHER): Payer: Self-pay | Admitting: Family Medicine

## 2019-02-25 ENCOUNTER — Encounter (INDEPENDENT_AMBULATORY_CARE_PROVIDER_SITE_OTHER): Payer: Self-pay

## 2019-02-25 NOTE — Telephone Encounter (Signed)
Ok to do? If so, for how long? See recent PT note in chart.

## 2019-02-25 NOTE — Telephone Encounter (Signed)
Yes, ok to give restrictions for one month.

## 2019-02-25 NOTE — Telephone Encounter (Signed)
Patient called, requesting work note with restirctions cannot push or pull cart . pts callback # [phone], leave message

## 2019-02-25 NOTE — Telephone Encounter (Signed)
Left a message with the interpreter service. Note has been written. Need to know if she wants to pick this up, have it mailed to her or would she like it faxed somewhere. Will await her return call.

## 2019-02-26 ENCOUNTER — Telehealth (INDEPENDENT_AMBULATORY_CARE_PROVIDER_SITE_OTHER): Payer: Self-pay

## 2019-02-26 NOTE — Telephone Encounter (Signed)
Pt called back and she will pick up letter this morning

## 2019-02-26 NOTE — Telephone Encounter (Signed)
Letter placed up front for pickup per patient request.

## 2019-02-26 NOTE — Telephone Encounter (Signed)
Letter placed up front for pick up

## 2019-02-28 NOTE — Progress Notes (Signed)
Reviewed  Dhani Dannemiller R Lowne Chase, DO  

## 2019-03-03 ENCOUNTER — Other Ambulatory Visit: Payer: Self-pay

## 2019-03-03 ENCOUNTER — Encounter: Payer: Self-pay | Admitting: Physical Therapy

## 2019-03-03 ENCOUNTER — Ambulatory Visit: Payer: Federal, State, Local not specified - PPO | Admitting: Physical Therapy

## 2019-03-03 DIAGNOSIS — M25511 Pain in right shoulder: Secondary | ICD-10-CM | POA: Diagnosis not present

## 2019-03-03 DIAGNOSIS — M25611 Stiffness of right shoulder, not elsewhere classified: Secondary | ICD-10-CM

## 2019-03-03 NOTE — Therapy (Signed)
[address] [place], [place], [address] Phone: [phone]   Fax:  [phone]  Physical Therapy Treatment  Patient Details  Name: Rhonda Morrow MRN: [id number] Date of Birth: Jul 01, 1961 Referring Provider (PT): Hilts   Encounter Date: 03/03/2019  PT End of Session - 03/03/19 1346    Visit Number  8    PT Start Time  1300    PT Stop Time  1401    PT Time Calculation (min)  61 min    Activity Tolerance  Patient tolerated treatment well    Behavior During Therapy  [facility] for tasks assessed/performed       Past Medical History:  Diagnosis Date  . Arthritis   . Asthma   . Complication of anesthesia    one time woke up and was vey scared,17 yrs ago  . Deaf   . Diabetes mellitus without complication ([place])   . GERD (gastroesophageal reflux disease)   . Hypertension   . Left groin pain   . Thyroid disease     Past Surgical History:  Procedure Laterality Date  . CARDIAC CATHETERIZATION    . CESAREAN SECTION    . CHOLECYSTECTOMY N/A 06/20/2016   Procedure: LAPAROSCOPIC CHOLECYSTECTOMY;  Surgeon: Rolm Bookbinder, MD;  Location: [facility];  Service: General;  Laterality: N/A;  . ESOPHAGEAL MANOMETRY N/A 09/29/2013   Procedure: ESOPHAGEAL MANOMETRY (EM);  Surgeon: Milus Banister, MD;  [facility];  Service: Endoscopy;  Laterality: N/A;  . TOTAL HIP ARTHROPLASTY Left 02/21/2017   Procedure: LEFT TOTAL HIP ARTHROPLASTY ANTERIOR APPROACH;  Surgeon: Leandrew Koyanagi, MD;  Location: [facility];  Service: Orthopedics;  Laterality: Left;    There were no vitals filed for this visit.  Subjective Assessment - 03/03/19 1305    Subjective  Pt reports that she is doing a little better, only a little pain.     Currently in Pain?  Yes    Pain Score  3     Pain Location  Shoulder    Pain Orientation  Right                       [facility] Adult PT Treatment/Exercise - 03/03/19 0001      Shoulder  Exercises: Standing   Flexion  20 reps;Strengthening    Shoulder Flexion Weight (lbs)  2    ABduction  Weights;20 reps;Strengthening    Shoulder ABduction Weight (lbs)  1    Extension  Both;20 reps;Theraband    Theraband Level (Shoulder Extension)  Level 3 (Green)    Row  [employer];Both    Theraband Level (Shoulder Row)  Level 3 (Green)    Other Standing Exercises  Biceps curls 2lb x10 L shoulder pain       Shoulder Exercises: ROM/Strengthening   UBE (Upper Arm Bike)  level 2 3 min fwd, 3 min bkwd    Other ROM/Strengthening Exercises  Rows & Lats 25lb 2x10    Other ROM/Strengthening Exercises  chest press 2x10       Manual Therapy   Manual Therapy  Joint mobilization;Passive ROM    Manual therapy comments  Some PROM taken to end range and held    Passive ROM  PROM all directions               PT Short Term Goals - 12/16/18 1515      PT SHORT TERM GOAL #1   Title  Ind with  initial HEP    Status  Achieved        PT Long Term Goals - 12/25/18 1712      PT LONG TERM GOAL #1   Title  decreased pain in right shoulder with ADLS by 44% or more to normalize ADLS.    Status  On-going      PT LONG TERM GOAL #2   Title  improved R shoulder ROM to [facility] to perform household chores and to reach behind her back.     Status  On-going      PT LONG TERM GOAL #3   Title  improved right shoulder strength to 4+/5 without pain to normalize ADLS    Status  On-going      PT LONG TERM GOAL #4   Title  Pt able to sleep without waking from pain or N/T    Status  On-going            Plan - 03/03/19 1347    Clinical Impression Statement  Pt reports some improvement from last session. She report less pain and greater mobility. Progressed to machine level strengthening without issue. No problems with seated chest press. Attempted biceps curls but reports her L shoulder started hurting discontinuing the exercises. She stated that she is going to get a shot in her shoulder.  Full PROM noted with MT.     Rehab Potential  Good    PT Frequency  2x / week    PT Treatment/Interventions  ADLs/Self Care Home Management;Cryotherapy;Electrical Stimulation;Iontophoresis 4mg /ml Dexamethasone;Moist Heat;Therapeutic exercise;Neuromuscular re-education;Patient/family education;Manual techniques;Dry needling;Taping    PT Next Visit Plan  R shoulder strength and ROM.        Patient will benefit from skilled therapeutic intervention in order to improve the following deficits and impairments:  Pain, Decreased range of motion, Decreased strength, Impaired UE functional use  Visit Diagnosis: Acute pain of right shoulder  Stiffness of right shoulder, not elsewhere classified     Problem List Patient Active Problem List   Diagnosis Date Noted  . BMI 40.0-44.9, adult ([place]) 01/22/2019  . RLQ abdominal pain 10/20/2018  . Rectal pain 10/20/2018  . Vitamin B 12 deficiency 03/28/2018  . B12 deficiency 03/28/2018  . DM (diabetes mellitus) type II uncontrolled, periph vascular disorder ([place]) 03/28/2018  . Hyperlipidemia 03/28/2018  . Primary osteoarthritis of left hip 02/21/2017  . 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 History of laparoscopic cholecystectomy 06/20/2016  . Cerumen impaction 06/09/2016  . Hypersomnia 05/16/2016  . Knee pain, right 05/05/2016  . RUQ pain 04/09/2016  . Abnormal CT scan 03/28/2016  . Dyspnea and respiratory abnormalities 03/15/2016  . Asthma with acute exacerbation 03/15/2016  . Asthma in adult 02/25/2016  . DM (diabetes mellitus) type II controlled, neurological manifestation ([place]) 02/09/2016  . Left hip pain 12/23/2015  . Right hamstring muscle strain 11/18/2015  . Abdominal pain, acute 09/01/2015  . Acute asthma exacerbation 04/16/2015  . Acute bronchitis 04/14/2015  . Vaginal discharge 09/14/2014  . Pap smear for cervical cancer screening 09/14/2014  . Scabies 05/06/2014  . Bed bug bite 05/06/2014  . Allergic rhinitis  04/09/2014  . Rectal itching 04/09/2014  . Bladder spasm 04/09/2014  . Knee pain, left 03/05/2014  . Hypokalemia 02/19/2014  . Nausea with vomiting 02/19/2014  . Diabetes mellitus, type II ([place]) 02/19/2014  . Edema 02/16/2014  . Disorder of rotator cuff 11/12/2013  . Routine general medical examination at a health care facility 08/20/2013  . GERD (gastroesophageal  reflux disease) 06/26/2013  . Dysphagia, pharyngoesophageal phase 06/26/2013  . Suprapubic pain 05/12/2013  . Medication side effect 03/25/2013  . Diarrhea 03/25/2013  . Benign positional vertigo 01/30/2013  . Traumatic hematoma of thigh 01/15/2013  . HTN (hypertension) 09/02/2012  . Dry skin 08/22/2012  . External hemorrhoids 08/22/2012  . Obesity 06/30/2012  . Thyromegaly 05/22/2012  . Back pain 05/22/2012  . Elevated glucose 05/22/2012  . Moderate persistent asthma 04/19/2012  . Dyspnea 01/28/2012    Scot Jun, PTA 03/03/2019, 1:52 PM  [address] [place], [place], [address] Phone: [phone]   Fax:  [phone]  Name: Rhonda Morrow MRN: [id number] Date of Birth: 1961/07/09

## 2019-03-10 ENCOUNTER — Ambulatory Visit: Payer: Federal, State, Local not specified - PPO | Attending: Family Medicine | Admitting: Physical Therapy

## 2019-03-18 ENCOUNTER — Telehealth: Payer: Self-pay | Admitting: Family Medicine

## 2019-03-18 ENCOUNTER — Other Ambulatory Visit: Payer: Self-pay | Admitting: Family Medicine

## 2019-03-18 ENCOUNTER — Other Ambulatory Visit: Payer: Self-pay

## 2019-03-18 NOTE — Telephone Encounter (Signed)
Requesting: Tramadol Contract: 11/07/2016 UDS: 02/11/2019, Low risk Last OV: 01/06/2019 Next OV: 05/12/2019 Last Refill: 01/09/2019, #14- 1 RF Database:   Please advise

## 2019-03-18 NOTE — Telephone Encounter (Signed)
Patient called to state that she needed a new note to state she couldn't push or pull.  Please call patient [phone]

## 2019-03-18 NOTE — Telephone Encounter (Signed)
I called - patient was out with her mom. Will try patient again later.

## 2019-03-19 ENCOUNTER — Telehealth: Payer: Self-pay

## 2019-03-19 ENCOUNTER — Other Ambulatory Visit: Payer: Self-pay | Admitting: Family Medicine

## 2019-03-19 DIAGNOSIS — E538 Deficiency of other specified B group vitamins: Secondary | ICD-10-CM

## 2019-03-19 MED ORDER — B-12 1000-400 MCG SL SUBL: SUBLINGUAL_TABLET | SUBLINGUAL | 3 refills | Status: DC

## 2019-03-19 NOTE — Telephone Encounter (Signed)
Sent in

## 2019-03-19 NOTE — Telephone Encounter (Signed)
I left a message for the patient through sign language interpreter system: Need to know if the patient is asking for an extension of her current work note (good through 03/28/2019) or if she needs a new note that says something different/worded differently. Will await a return call from the patient on this.

## 2019-03-19 NOTE — Telephone Encounter (Signed)
Pt called w/ sign language interpreter- she called regarding B12- she stated that she called yesterday regarding having the B12 sublingual tablets sent to her pharmacy (no note in chart regarding this discussion). I informed that Dr. Nonda Lou assistant is out of office (unsure if this is who she spoke with yesterday) but I would send message to PCP regarding having B12 sublingual sent to her pharmacy. Pt's interpreter verbalized understanding.

## 2019-03-24 ENCOUNTER — Telehealth: Payer: Self-pay | Admitting: Gastroenterology

## 2019-03-24 NOTE — Telephone Encounter (Signed)
The pt was advised via sign language interpreter that colon is not due until 11/24

## 2019-03-24 NOTE — Telephone Encounter (Signed)
PT called wanting to know when she is schded for colon I did not see an appt and advised that recall for 2024 pt stated that was not correct and wanted to speak with the nurse.

## 2019-03-25 NOTE — Telephone Encounter (Signed)
Ok to extend for one more month.  Then needs ov if still having troubles.

## 2019-03-25 NOTE — Telephone Encounter (Signed)
New note written for the patient to pick up tomorrow. I put a note in the envelope (with the letter), advising her of the need for a recheck office visit if she continues to have problems with her shoulder.

## 2019-03-25 NOTE — Telephone Encounter (Signed)
The patient called back stating she would like an extension of the restriction on her work note (no pushing/pulling of cart) for another month. Is this ok to give or does she need a recheck in the office first?

## 2019-03-26 ENCOUNTER — Other Ambulatory Visit: Payer: Self-pay

## 2019-03-26 NOTE — Telephone Encounter (Signed)
Pt calling back for status of Rx. She states she would like a call back when the dr decides what she is going do about Rx. Cobalamin Combinations (B-12) 1000-400 MCG SUBL [facility] do not [facility].

## 2019-03-26 NOTE — Telephone Encounter (Signed)
Called the pharmacy. Pharmacy states that the B12 sent is an OTC med and that's why they couldn't fill. Should we send in a non OTC B12? I will ask Rhonda Morrow about the [facility] paperwork

## 2019-03-26 NOTE — Telephone Encounter (Signed)
Pt wanted otc-- I assumed her ins would pay

## 2019-03-26 NOTE — Telephone Encounter (Signed)
Left message on machine to call back  

## 2019-03-26 NOTE — Telephone Encounter (Signed)
Copied from [place] [phone]. Topic: General - Other >> Mar 25, 2019  3:12 PM Percell Belt A wrote: Reason for CRM: pt called in and stated that Dr was suppose to called in some VIT b12 tabs.  She went to pharmacy and they did not have them. Did not see them on med list.   Also pt would like to check the status of the FMLA Paperwork, She stated she has been waiting on this paperwork for over a month.  She is needing the case id number.  She is needing a copy of this paperwork  Please advise  Best number -[id number] - [place], [place] [phone] (Phone)

## 2019-03-26 NOTE — Addendum Note (Signed)
Addended by: Kem Boroughs D on: 03/26/2019 11:56 AM   Modules accepted: Orders

## 2019-04-01 ENCOUNTER — Telehealth: Payer: Self-pay | Admitting: Family Medicine

## 2019-04-01 MED ORDER — B-12 1000 MCG SL SUBL: SUBLINGUAL_TABLET | SUBLINGUAL | 1 refills | Status: DC

## 2019-04-01 NOTE — Telephone Encounter (Signed)
lmom for patient to call back to discuss paperwork.  It looks like it was faxed back on 02/12/19.  b12 sent in

## 2019-04-01 NOTE — Telephone Encounter (Signed)
Patient called requesting information about her FMLA forms (if they are complete) and B12 medication that was sent to the pharmacy. Requesting a call back. Please advise

## 2019-04-01 NOTE — Telephone Encounter (Signed)
Patient wanted a form that was with fmla forms. Put up front for patient to pickup and also advised that b12 was sent in.

## 2019-04-07 ENCOUNTER — Other Ambulatory Visit: Payer: Self-pay

## 2019-04-07 ENCOUNTER — Ambulatory Visit (INDEPENDENT_AMBULATORY_CARE_PROVIDER_SITE_OTHER): Payer: Federal, State, Local not specified - PPO | Admitting: Gastroenterology

## 2019-04-07 DIAGNOSIS — R197 Diarrhea, unspecified: Secondary | ICD-10-CM | POA: Diagnosis not present

## 2019-04-07 DIAGNOSIS — K5900 Constipation, unspecified: Secondary | ICD-10-CM | POA: Diagnosis not present

## 2019-04-07 NOTE — Patient Instructions (Addendum)
   She will follow-up on an as-needed basis  Thank you for entrusting me with your care and choosing [facility].  Dr Ardis Hughs

## 2019-04-07 NOTE — Progress Notes (Signed)
Review of pertinent gastrointestinal problems: 1.Dysphasia,EGD November 2014, Dr. Ardis Hughs was completely normal. High resolution esophageal manometry December 2014 was interpreted as normal. 2.Routine risk for colon cancer.Colonoscopy November 2014 Dr. Ardis Hughs for routine risk screening found a single subcentimeter polyp. It was not adenomatous. She was recommended to have repeat colonoscopy at 10-year interval. 3.  Diarrhea; 2020 GI pathogen panel was negative.  Trial of cholestyramine given gallbladder removal 2 to 3 years prior was over effective.  She became significantly constipated.   This service was provided via virtual visit.  Only audio was used.  There was a professional sign language interpreter who was also on the phone who was relaying sign language to the patient.  Visual was not possible at the same time as the audio given these constraints.  The patient was located at home.  I was located in my office.  The patient did consent to this virtual visit and is aware of possible charges through their insurance for this visit.  The patient is an established patient.  My certified medical assistant, Grace Bushy, contributed to this visit by contacting the patient by phone 1 or 2 business days prior to the appointment and also followed up on the recommendations I made after the visit.  Time spent on virtual visit: 18 minutes   HPI: This is a very pleasant 57 year old woman whom I last met with via telemedicine visit about 2 months ago  Her right shoulder has been sore and she is resting it lately..  Since last appt 2 months ago, her bowels are pretty normal.  Scraping sensation in her stomach occurred one time in the past couple months.  She stopped taking dulcolax and she's done well. She is pretty happy with the way her bowels are running lately.  She has had no overt bleeding.  No unexplained weight loss.   Chief complaint is previous diarrhea, constipation  Blood work  March 2020 shows a normal CBC, normal complete metabolic profile   ROS: complete GI ROS as described in HPI, all other review negative.  Constitutional:  No unintentional weight loss   Past Medical History:  Diagnosis Date  . Arthritis   . Asthma   . Complication of anesthesia    one time woke up and was vey scared,17 yrs ago  . Deaf   . Diabetes mellitus without complication ([place])   . GERD (gastroesophageal reflux disease)   . Hypertension   . Left groin pain   . Thyroid disease     Past Surgical History:  Procedure Laterality Date  . CARDIAC CATHETERIZATION    . CESAREAN SECTION    . CHOLECYSTECTOMY N/A 06/20/2016   Procedure: LAPAROSCOPIC CHOLECYSTECTOMY;  Surgeon: Rolm Bookbinder, MD;  Location: [facility];  Service: General;  Laterality: N/A;  . ESOPHAGEAL MANOMETRY N/A 09/29/2013   Procedure: ESOPHAGEAL MANOMETRY (EM);  Surgeon: Milus Banister, MD;  Location[facility];  Service: Endoscopy;  Laterality: N/A;  . TOTAL HIP ARTHROPLASTY Left 02/21/2017   Procedure: LEFT TOTAL HIP ARTHROPLASTY ANTERIOR APPROACH;  Surgeon: Leandrew Koyanagi, MD;  Location: [facility];  Service: Orthopedics;  Laterality: Left;    Current Outpatient Medications  Medication Sig Dispense Refill  . albuterol (PROVENTIL HFA;VENTOLIN HFA) 108 (90 Base) MCG/ACT inhaler Inhale 2 puffs into the lungs every 6 (six) hours as needed for wheezing. 1 Inhaler 5  . allopurinol (ZYLOPRIM) 100 MG tablet TAKE 1 TABLET BY MOUTH EVERY DAY (Patient taking differently: Take 100 mg by mouth daily. ) 90 tablet  2  . amLODipine (NORVASC) 5 MG tablet TAKE 1 TABLET BY MOUTH EVERY DAY 90 tablet 1  . aspirin EC 325 MG tablet Take 1 tablet (325 mg total) by mouth 2 (two) times daily. 84 tablet 0  . azelastine (ASTELIN) 0.1 % nasal spray Place 2 sprays into both nostrils 2 (two) times daily. Use in each nostril as directed 30 mL 3  . budesonide-formoterol (SYMBICORT) 160-4.5 MCG/ACT inhaler Inhale 2 puffs into the lungs every 12  (twelve) hours. 1 Inhaler 0  . cetirizine (ZYRTEC) 10 MG tablet TAKE 1 TABLET BY MOUTH EVERY DAY (Patient taking differently: Take 10 mg by mouth daily. No Therapeutic Substitution) 30 tablet 5  . cholestyramine (QUESTRAN) 4 GM/DOSE powder Take 1 packet (4 g total) by mouth daily for 30 days. 30 packet 3  . colchicine 0.6 MG tablet TAKE 1 TABLET BY MOUTH EVERY DAY 90 tablet 1  . Cyanocobalamin (B-12) 1000 MCG SUBL Dissolove 1 tablet under tongue once a day 90 each 1  . diclofenac (VOLTAREN) 75 MG EC tablet Take 1 tablet (75 mg total) by mouth 2 (two) times daily as needed. 60 tablet 3  . diclofenac sodium (VOLTAREN) 1 % GEL Apply 4 g topically 4 (four) times daily as needed. 500 g 6  . Fiber CHEW Chew 1 tablet by mouth daily.    . fluticasone (FLONASE) 50 MCG/ACT nasal spray PLACE 2 SPRAYS INTO BOTH NOSTRILS DAILY. 16 g 4  . glucose blood test strip Use as instructed--- one touch verio test strips 100 each 12  . hydrochlorothiazide (HYDRODIURIL) 12.5 MG tablet TAKE 1 TABLET BY MOUTH EVERY DAY (Patient taking differently: Take 25 mg by mouth daily. ) 90 tablet 1  . ipratropium-albuterol (DUONEB) 0.5-2.5 (3) MG/3ML SOLN Take 3 mLs by nebulization every 6 (six) hours as needed. 360 mL 3  . KLOR-CON M20 20 MEQ tablet TAKE 1 TABLET BY MOUTH EVERY DAY 90 tablet 1  . Lancets (ONETOUCH ULTRASOFT) lancets Use as instructed 100 each 12  . meclizine (ANTIVERT) 12.5 MG tablet Take 1 tablet (12.5 mg total) by mouth 3 (three) times daily as needed for dizziness. 30 tablet 0  . metaxalone (SKELAXIN) 800 MG tablet TAKE 1 TABLET BY MOUTH THREE TIMES A DAY (Patient taking differently: Take 800 mg by mouth 3 (three) times daily. ) 30 tablet 2  . metFORMIN (GLUCOPHAGE-XR) 500 MG 24 hr tablet TAKE 1 TABLET BY MOUTH EVERY DAY WITH BREAKFAST 90 tablet 1  . mometasone-formoterol (DULERA) 100-5 MCG/ACT AERO Inhale 2 puffs into the lungs 2 (two) times daily. 1 Inhaler PRN  . montelukast (SINGULAIR) 10 MG tablet Take 1  tablet (10 mg total) by mouth at bedtime. 90 tablet 3  . Multiple Vitamin (MULTIVITAMIN) tablet Take 1 tablet by mouth daily.    . NONFORMULARY OR COMPOUNDED ITEM Nebulizer   DX ASTHMA 1 each 0  . ondansetron (ZOFRAN ODT) 4 MG disintegrating tablet Take 1 tablet (4 mg total) by mouth every 8 (eight) hours as needed for nausea or vomiting. 10 tablet 0  . pantoprazole (PROTONIX) 40 MG tablet Take 1 tablet (40 mg total) by mouth daily. 30 tablet 3  . ranitidine (ZANTAC) 150 MG tablet TAKE 1 TABLET BY MOUTH TWICE A DAY (Patient taking differently: Take 150 mg by mouth 2 (two) times daily. ) 180 tablet 1  . Spacer/Aero-Holding Chambers (AEROCHAMBER MV) inhaler Use as instructed 1 each 0  . traMADol (ULTRAM) 50 MG tablet TAKE 1-2 TABLETS (50-100 MG TOTAL) BY MOUTH  AT BEDTIME AS NEEDED. 14 tablet 1  . zolpidem (AMBIEN) 10 MG tablet TAKE 1 TABLET BY MOUTH AT BEDTIME AS NEEDED FOR SLEEP 15 tablet 1   No current facility-administered medications for this visit.     Allergies as of 04/07/2019 - Review Complete 04/07/2019  Allergen Reaction Noted  . Losartan Shortness Of Breath 10/26/2014  . Oxycodone-acetaminophen Nausea And Vomiting 07/23/2013  . Augmentin [amoxicillin-pot clavulanate] Diarrhea 03/25/2013    Family History  Problem Relation Age of Onset  . Diabetes Mother   . Heart disease Mother   . Diabetes Father     Social History   Socioeconomic History  . Marital status: Married    Spouse name: Not on file  . Number of children: Not on file  . Years of education: Not on file  . Highest education level: Not on file  Occupational History  . Occupation: disabled  Social Needs  . Financial resource strain: Not on file  . Food insecurity:    Worry: Not on file    Inability: Not on file  . Transportation needs:    Medical: Not on file    Non-medical: Not on file  Tobacco Use  . Smoking status: Never Smoker  . Smokeless tobacco: Never Used  Substance and Sexual Activity  .  Alcohol use: No    Alcohol/week: 0.0 standard drinks  . Drug use: No  . Sexual activity: Never  Lifestyle  . Physical activity:    Days per week: Not on file    Minutes per session: Not on file  . Stress: Not on file  Relationships  . Social connections:    Talks on phone: Not on file    Gets together: Not on file    Attends religious service: Not on file    Active member of club or organization: Not on file    Attends meetings of clubs or organizations: Not on file    Relationship status: Not on file  . Intimate partner violence:    Fear of current or ex partner: Not on file    Emotionally abused: Not on file    Physically abused: Not on file    Forced sexual activity: Not on file  Other Topics Concern  . Not on file  Social History Narrative   Lives with family.  Has 3 children.  Works for [employer].  Education: high school.     Physical Exam: Unable to perform because this was a "telemed visit" due to current Covid-19 pandemic  Assessment and plan: 58 y.o. female with previous constipation diarrhea, mild single episode of abdominal pain  Her bowels seem to have evened out quite well after some therapeutic misadventure with cholestyramine related severe constipation.  She does not need laxatives or cholestyramine currently.  She is pretty happy with the way her bowels are working overall.  She did have a bothersome single episode of a scraping sensation when she was moving her bowels, the pain was located in her abdomen.  Unclear etiology but I think it is unlikely anything serious given that it only occurred 1 time and has not recurred since and it was quite brief.  She is to call if that symptom becomes more frequent or bothersome.  Please see the "Patient Instructions" section for addition details about the plan.  Owens Loffler, MD [facility] 04/07/2019, 9:56 AM

## 2019-04-25 ENCOUNTER — Encounter: Payer: Self-pay | Admitting: Family Medicine

## 2019-04-25 ENCOUNTER — Ambulatory Visit (INDEPENDENT_AMBULATORY_CARE_PROVIDER_SITE_OTHER): Payer: Federal, State, Local not specified - PPO | Admitting: Family Medicine

## 2019-04-25 ENCOUNTER — Other Ambulatory Visit: Payer: Self-pay

## 2019-04-25 DIAGNOSIS — M19011 Primary osteoarthritis, right shoulder: Secondary | ICD-10-CM | POA: Diagnosis not present

## 2019-04-25 DIAGNOSIS — G8929 Other chronic pain: Secondary | ICD-10-CM | POA: Diagnosis not present

## 2019-04-25 DIAGNOSIS — M25511 Pain in right shoulder: Secondary | ICD-10-CM

## 2019-04-25 NOTE — Progress Notes (Signed)
Office Visit Note   Patient: Rhonda Morrow           Date of Birth: 1961/05/22           MRN: [id number] Visit Date: 04/25/2019 Requested by: [address] [place], [address] [place],  [place] [address] PCP: Carollee Herter, Alferd Apa, DO  Subjective: Chief Complaint  Patient presents with  . Right Shoulder - Pain    Shoulder had been a bit better, but started hurting bad again x a few weeks. Occasionally cannot move the arm when she first wakes up in the morning.    HPI: She is here with persistent right shoulder pain.  Pain is starting to travel down the front of her arm.  No neck pain, no numbness or tingling.  Doing some physical therapy and taking anti-inflammatories with minimal improvement.  She was concerned about the possibility of a stroke.  She is here with an interpreter today.              ROS: No fevers or chills.  All other systems were reviewed and are negative.  Objective: Vital Signs: LMP 05/20/2012   Physical Exam:  General:  Alert and oriented, in no acute distress. Pulm:  Breathing unlabored. Psy:  Normal mood, congruent affect. Skin: No rash on her skin. Right shoulder: Limited range of motion with overhead and behind the back reach.  Still extremely tender to palpation over the [facility] joint, with positive AC crossover test.  Isometric rotator cuff strength remains 5 out of 5 but she does have pain with empty can test.  Remainder of upper extremities neurologically intact.  Imaging: None today.  Assessment & Plan: 1.  Worsening chronic right shoulder pain, possibly due to [facility] joint arthropathy.  Cannot rule out partial rotator cuff tear.  Exam is not consistent with a stroke. -Reassurance, we will proceed with MRI scan and she will follow-up after that to go over the results.  If no indication for surgery then we could try an injection.     Procedures: No procedures performed  No notes on file     PMFS History: Patient Active Problem  List   Diagnosis Date Noted  . BMI 40.0-44.9, adult ([place]) 01/22/2019  . RLQ abdominal pain 10/20/2018  . Rectal pain 10/20/2018  . Vitamin B 12 deficiency 03/28/2018  . B12 deficiency 03/28/2018  . DM (diabetes mellitus) type II uncontrolled, periph vascular disorder ([place]) 03/28/2018  . Hyperlipidemia 03/28/2018  . Primary osteoarthritis of left hip 02/21/2017  . 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 History of laparoscopic cholecystectomy 06/20/2016  . Cerumen impaction 06/09/2016  . Hypersomnia 05/16/2016  . Knee pain, right 05/05/2016  . RUQ pain 04/09/2016  . Abnormal CT scan 03/28/2016  . Dyspnea and respiratory abnormalities 03/15/2016  . Asthma with acute exacerbation 03/15/2016  . Asthma in adult 02/25/2016  . DM (diabetes mellitus) type II controlled, neurological manifestation ([place]) 02/09/2016  . Left hip pain 12/23/2015  . Right hamstring muscle strain 11/18/2015  . Abdominal pain, acute 09/01/2015  . Acute asthma exacerbation 04/16/2015  . Acute bronchitis 04/14/2015  . Vaginal discharge 09/14/2014  . Pap smear for cervical cancer screening 09/14/2014  . Scabies 05/06/2014  . Bed bug bite 05/06/2014  . Allergic rhinitis 04/09/2014  . Rectal itching 04/09/2014  . Bladder spasm 04/09/2014  . Knee pain, left 03/05/2014  . Hypokalemia 02/19/2014  . Nausea with vomiting 02/19/2014  . Diabetes mellitus,  type II ([place]) 02/19/2014  . Edema 02/16/2014  . Disorder of rotator cuff 11/12/2013  . Routine general medical examination at a health care facility 08/20/2013  . GERD (gastroesophageal reflux disease) 06/26/2013  . Dysphagia, pharyngoesophageal phase 06/26/2013  . Suprapubic pain 05/12/2013  . Medication side effect 03/25/2013  . Diarrhea 03/25/2013  . Benign positional vertigo 01/30/2013  . Traumatic hematoma of thigh 01/15/2013  . HTN (hypertension) 09/02/2012  . Dry skin 08/22/2012  . External hemorrhoids 08/22/2012  . Obesity 06/30/2012  .  Thyromegaly 05/22/2012  . Back pain 05/22/2012  . Elevated glucose 05/22/2012  . Moderate persistent asthma 04/19/2012  . Dyspnea 01/28/2012   Past Medical History:  Diagnosis Date  . Arthritis   . Asthma   . Complication of anesthesia    one time woke up and was vey scared,17 yrs ago  . Deaf   . Diabetes mellitus without complication ([place])   . GERD (gastroesophageal reflux disease)   . Hypertension   . Left groin pain   . Thyroid disease     Family History  Problem Relation Age of Onset  . Diabetes Mother   . Heart disease Mother   . Diabetes Father     Past Surgical History:  Procedure Laterality Date  . CARDIAC CATHETERIZATION    . CESAREAN SECTION    . CHOLECYSTECTOMY N/A 06/20/2016   Procedure: LAPAROSCOPIC CHOLECYSTECTOMY;  Surgeon: Rolm Bookbinder, MD;  Location: [facility];  Service: General;  Laterality: N/A;  . ESOPHAGEAL MANOMETRY N/A 09/29/2013   Procedure: ESOPHAGEAL MANOMETRY (EM);  Surgeon: Milus Banister, MD;  [facility];  Service: Endoscopy;  Laterality: N/A;  . TOTAL HIP ARTHROPLASTY Left 02/21/2017   Procedure: LEFT TOTAL HIP ARTHROPLASTY ANTERIOR APPROACH;  Surgeon: Leandrew Koyanagi, MD;  Location: [facility];  Service: Orthopedics;  Laterality: Left;   Social History   Occupational History  . Occupation: disabled  Tobacco Use  . Smoking status: Never Smoker  . Smokeless tobacco: Never Used  Substance and Sexual Activity  . Alcohol use: No    Alcohol/week: 0.0 standard drinks  . Drug use: No  . Sexual activity: Never

## 2019-04-28 ENCOUNTER — Other Ambulatory Visit: Payer: Self-pay | Admitting: Family Medicine

## 2019-04-28 DIAGNOSIS — Z889 Allergy status to unspecified drugs, medicaments and biological substances status: Secondary | ICD-10-CM

## 2019-05-08 ENCOUNTER — Other Ambulatory Visit: Payer: Self-pay | Admitting: Family Medicine

## 2019-05-08 DIAGNOSIS — I10 Essential (primary) hypertension: Secondary | ICD-10-CM

## 2019-05-12 ENCOUNTER — Ambulatory Visit (INDEPENDENT_AMBULATORY_CARE_PROVIDER_SITE_OTHER): Payer: Federal, State, Local not specified - PPO | Admitting: Medical

## 2019-05-12 ENCOUNTER — Ambulatory Visit: Payer: Federal, State, Local not specified - PPO | Admitting: Family Medicine

## 2019-05-12 ENCOUNTER — Other Ambulatory Visit: Payer: Self-pay

## 2019-05-12 DIAGNOSIS — E669 Obesity, unspecified: Secondary | ICD-10-CM

## 2019-05-12 DIAGNOSIS — G629 Polyneuropathy, unspecified: Secondary | ICD-10-CM

## 2019-05-12 DIAGNOSIS — R5383 Other fatigue: Secondary | ICD-10-CM

## 2019-05-12 DIAGNOSIS — R252 Cramp and spasm: Secondary | ICD-10-CM

## 2019-05-12 DIAGNOSIS — L509 Urticaria, unspecified: Secondary | ICD-10-CM

## 2019-05-12 DIAGNOSIS — D649 Anemia, unspecified: Secondary | ICD-10-CM

## 2019-05-12 MED ORDER — NYSTATIN-TRIAMCINOLONE 100000-0.1 UNIT/GM-% EX CREA: TOPICAL_CREAM | CUTANEOUS | 0 refills | Status: DC

## 2019-05-12 MED ORDER — HYDROXYZINE HCL 10 MG PO TABS: ORAL_TABLET | ORAL | 0 refills | Status: DC

## 2019-05-12 MED ORDER — TRIAMCINOLONE ACETONIDE 0.1 % EX CREA: TOPICAL_CREAM | CUTANEOUS | 0 refills | Status: DC

## 2019-05-12 NOTE — Progress Notes (Signed)
Subjective:    Patient ID: Rhonda Morrow, female    DOB: 08-05-1961, 58 y.o.   MRN: [id number]  HPI  Virtual Visit via Telephone Note  I connected with Rhonda Morrow on 05/12/19 at  2:20 PM EDT by telephone and verified that I am speaking with the correct person using two identifiers.  Location: Patient: home Provider: office  No bp check or pulse checked.  Visit done with sign language interpretor who was off site but had video capability/connection with pt.   I discussed the limitations, risks, security and privacy concerns of performing an evaluation and management service by telephone and the availability of in person appointments. I also discussed with the patient that there may be a patient responsible charge related to this service. The patient expressed understanding and agreed to proceed.    History of Present Illness:  Pt had rash on her hands recently over past 7 days . Rash on hands and back finger look pink per pt.Itches a lot. Pt washes her hands about 5 times a day.   Under her breast some mild rash and itches a well. No white dc.  Some insomnia at night due to itch.  Pt bottom of feet burn and electric type pain on bottom of feet. Day and night.No radicular pain from her back. Pt is not diabetic Prediabetic range.   Also some leg/calf cramps recently for about one week.  Some failed attempts to lose weight. Tries to restrict certain foods at times along with mild exercise but has progressivley gained weight. Never tried weight watchers.     Observations/Objective: None- as exam done through I spoke with sign language interpretor.(interpretor did not describe any distress. Interpretor could not give any description of pt skin.    Assessment and Plan: Patient may have some dyshidrotic type eczema although cannot say with certainty as do not have your capability today.  For this I did prescribe triamcinolone cream to use twice daily.  Advised patient to use  moisturizer twice daily to her hands as well.  Try to limit over washing of hands which might be difficult during COVID pandemic.  I did advise her to use Dove moisturizing soap . For a rash underneath her breasts, I do think this might/probably represents yeast infection.  She describes mild itching so I wrote combination cream of nystatin/triamcinolone.  Apply ice to area twice daily.  For description of neuropathy, consider possibility of diabetic neuropathy but her A1c's have been relatively well controlled.  Patient has no back pain as well so did not think radicular type nerve pain.  Decided to place future A1c, be 1, B12 and folate level to look for other causes of neuropathy.  Can is obese and she does desire to lose weight.  Also reports some mild fatigue so I did place order for TSH and advised that I would consider earlier investigating weight watchers weight loss after.  I think this is not very expensive and it is a reasonable option.  I think this would benefit her sugar/prediabetic level as well.  Patient did also add on complaint of calf cramping and mild fatigue.  So I did decide to add CBC, CBC, iron level and magnesium level to her future labs.  As a staff to get her scheduled for future labs in 1 week. Explained during the interim while she waits the labs she could use [place] leg cramps over-the-counter and eat a banana a day.  40 minutes was spent with  patient and interpreter today.  50% of time was spent counseling patient on treatment and plan for each diagnosis and answering questions regarding patient's concerns/diagnoses.  Follow Up Instructions:    I discussed the assessment and treatment plan with the patient. The patient was provided an opportunity to ask questions and all were answered. The patient agreed with the plan and demonstrated an understanding of the instructions.   The patient was advised to call back or seek an in-person evaluation if the symptoms worsen or  if the condition fails to improve as anticipated.  I provided 40 minutes of non-face-to-face time during this encounter.   Mackie Pai, PA-C    Review of Systems  Constitutional: Positive for fatigue. Negative for chills and fever.  Respiratory: Negative for cough, chest tightness, shortness of breath and wheezing.   Cardiovascular: Negative for chest pain and palpitations.  Gastrointestinal: Negative for abdominal pain, blood in stool and diarrhea.  Musculoskeletal: Negative for back pain.       Some back pain in the past but no associated radicular type pain.  Some lower extremity cramping intermittently.  Skin: Positive for rash.  Neurological:       Neuropathic type pain.  See HPI.  Hematological: Negative for adenopathy. Does not bruise/bleed easily.  Psychiatric/Behavioral: Negative for behavioral problems and dysphoric mood.       Objective:   Physical Exam        Assessment & Plan:

## 2019-05-12 NOTE — Patient Instructions (Addendum)
Patient may have some dyshidrotic type eczema although cannot say with certainty as do not have your capability today.  For this I did prescribe triamcinolone cream to use twice daily.  Advised patient to use moisturizer twice daily to her hands as well.  Try to limit over washing of hands which might be difficult during COVID pandemic.  I did advise her to use Dove moisturizing soap . For a rash underneath her breasts, I do think this might/probably represents yeast infection.  She describes mild itching so I wrote combination cream of nystatin/triamcinolone.  Apply ice to area twice daily.  For description of neuropathy, consider possibility of diabetic neuropathy but her A1c's have been relatively well controlled.  Patient has no back pain as well so did not think radicular type nerve pain.  Decided to place future A1c, be 1, B12 and folate level to look for other causes of neuropathy.  Can is obese and she does desire to lose weight.  Also reports some mild fatigue so I did place order for TSH and advised that I would consider earlier investigating weight watchers weight loss after.  I think this is not very expensive and it is a reasonable option.  I think this would benefit her sugar/prediabetic level as well.  Patient did also add on complaint of calf cramping and mild fatigue.  So I did decide to add CBC, CBC, iron level and magnesium level to her future labs.  As a staff to get her scheduled for future labs in 1 week.  Explained during the interim while she waits the labs she could use [place] leg cramps over-the-counter and eat a banana a day.  Explained to patient that if by the time of her appointment for labs comes around and if she still having a rash is been we could have her seen for office visit on same day and for labs.

## 2019-05-13 ENCOUNTER — Ambulatory Visit: Payer: Self-pay | Admitting: *Deleted

## 2019-05-13 NOTE — Telephone Encounter (Signed)
Assisted by interpreter [phone] with [facility]; pt called with complaints of lower back pain (waist meets the buttocks) which started on the right lower back 05/11/2019, and on the left 05/13/2019; she says that it feels like spasms; the pt says that she has a history of kidney stones; the pt would like an x-ray; recommendations made per nurse triage protocol; the pt does not want to go to the ED; discussed issue with Balinda Quails at [facility], and she concurs with the disposition of nurse triage; pt transferred to [facility] for further recommendations; she sees Dr Etter Sjogren, [facility]; will route to office for final disposition.  Reason for Disposition . Patient sounds very sick or weak to the triager  Answer Assessment - Initial Assessment Questions 1. ONSET: "When did the pain begin?"      05/11/2019 2. LOCATION: "Where does it hurt?" (upper, mid or lower back)     Lower back 3. SEVERITY: "How bad is the pain?"  (e.g., Scale 1-10; mild, moderate, or severe)   - MILD (1-3): doesn't interfere with normal activities    - MODERATE (4-7): interferes with normal activities or awakens from sleep    - SEVERE (8-10): excruciating pain, unable to do any normal activities      6 out of 10 4. PATTERN: "Is the pain constant?" (e.g., yes, no; constant, intermittent)      constant 5. RADIATION: "Does the pain shoot into your legs or elsewhere?"    no 6. CAUSE:  "What do you think is causing the back pain?"     Not sure 7. BACK OVERUSE:  "Any recent lifting of heavy objects, strenuous work or exercise?"     no 8. MEDICATIONS: "What have you taken so far for the pain?" (e.g., nothing, acetaminophen, NSAIDS)    no 9. NEUROLOGIC SYMPTOMS: "Do you have any weakness, numbness, or problems with bowel/bladder control?"     no 10. OTHER SYMPTOMS: "Do you have any other symptoms?" (e.g., fever, abdominal pain, burning with urination, blood in urine)       Can hardly lift leg to get into shower due to pain 11. PREGNANCY: "Is  there any chance you are pregnant?" (e.g., yes, no; LMP)      No BTL  Protocols used: FLANK PAIN-A-AH, BACK PAIN-A-AH

## 2019-05-13 NOTE — Telephone Encounter (Signed)
Spoke w/ Pt and interpreter- recommend she go to urgent care or ED. Pt denied- appt scheduled in office tomorrow with Percell Miller.

## 2019-05-14 ENCOUNTER — Ambulatory Visit (HOSPITAL_BASED_OUTPATIENT_CLINIC_OR_DEPARTMENT_OTHER)
Admission: RE | Admit: 2019-05-14 | Discharge: 2019-05-14 | Disposition: A | Discharge status: Home / Self Care | Payer: Federal, State, Local not specified - PPO | Source: Ambulatory Visit | Attending: Medical | Admitting: Medical

## 2019-05-14 ENCOUNTER — Ambulatory Visit (INDEPENDENT_AMBULATORY_CARE_PROVIDER_SITE_OTHER): Payer: Federal, State, Local not specified - PPO | Admitting: Medical

## 2019-05-14 ENCOUNTER — Telehealth: Payer: Self-pay | Admitting: Medical

## 2019-05-14 ENCOUNTER — Encounter: Payer: Self-pay | Admitting: Medical

## 2019-05-14 ENCOUNTER — Other Ambulatory Visit: Payer: Self-pay

## 2019-05-14 ENCOUNTER — Other Ambulatory Visit: Payer: Self-pay | Admitting: Medical

## 2019-05-14 VITALS — BP 133/88 | HR 82 | Temp 98.4°F | Resp 16 | Ht 62.0 in | Wt 241.0 lb

## 2019-05-14 DIAGNOSIS — D649 Anemia, unspecified: Secondary | ICD-10-CM | POA: Diagnosis not present

## 2019-05-14 DIAGNOSIS — M5442 Lumbago with sciatica, left side: Secondary | ICD-10-CM | POA: Diagnosis not present

## 2019-05-14 DIAGNOSIS — R82998 Other abnormal findings in urine: Secondary | ICD-10-CM | POA: Diagnosis not present

## 2019-05-14 DIAGNOSIS — M25552 Pain in left hip: Secondary | ICD-10-CM | POA: Diagnosis not present

## 2019-05-14 DIAGNOSIS — R252 Cramp and spasm: Secondary | ICD-10-CM | POA: Diagnosis not present

## 2019-05-14 DIAGNOSIS — M25551 Pain in right hip: Secondary | ICD-10-CM | POA: Diagnosis not present

## 2019-05-14 DIAGNOSIS — G629 Polyneuropathy, unspecified: Secondary | ICD-10-CM | POA: Diagnosis not present

## 2019-05-14 DIAGNOSIS — M545 Low back pain: Secondary | ICD-10-CM | POA: Diagnosis not present

## 2019-05-14 DIAGNOSIS — Z96642 Presence of left artificial hip joint: Secondary | ICD-10-CM | POA: Diagnosis not present

## 2019-05-14 DIAGNOSIS — R5383 Other fatigue: Secondary | ICD-10-CM | POA: Diagnosis not present

## 2019-05-14 DIAGNOSIS — R109 Unspecified abdominal pain: Secondary | ICD-10-CM

## 2019-05-14 LAB — MAGNESIUM: Magnesium: 1.9 mg/dL (ref 1.5–2.5)

## 2019-05-14 LAB — IRON: Iron: 93 ug/dL (ref 42–145)

## 2019-05-14 LAB — CBC WITH DIFFERENTIAL/PLATELET
Basophils Absolute: 0 10*3/uL (ref 0.0–0.1)
Basophils Relative: 0.8 % (ref 0.0–3.0)
Eosinophils Absolute: 0.1 10*3/uL (ref 0.0–0.7)
Eosinophils Relative: 2.3 % (ref 0.0–5.0)
HCT: 44.6 % (ref 36.0–46.0)
Hemoglobin: 14.3 g/dL (ref 12.0–15.0)
Lymphocytes Relative: 30.2 % (ref 12.0–46.0)
Lymphs Abs: 1.5 10*3/uL (ref 0.7–4.0)
MCHC: 32 g/dL (ref 30.0–36.0)
MCV: 86.3 fl (ref 78.0–100.0)
Monocytes Absolute: 0.6 10*3/uL (ref 0.1–1.0)
Monocytes Relative: 11.2 % (ref 3.0–12.0)
Neutro Abs: 2.9 10*3/uL (ref 1.4–7.7)
Neutrophils Relative %: 55.5 % (ref 43.0–77.0)
Platelets: 161 10*3/uL (ref 150.0–400.0)
RBC: 5.17 Mil/uL — ABNORMAL HIGH (ref 3.87–5.11)
RDW: 14.2 % (ref 11.5–15.5)
WBC: 5.1 10*3/uL (ref 4.0–10.5)

## 2019-05-14 LAB — COMPREHENSIVE METABOLIC PANEL
ALT: 32 U/L (ref 0–35)
AST: 22 U/L (ref 0–37)
Albumin: 4 g/dL (ref 3.5–5.2)
Alkaline Phosphatase: 75 U/L (ref 39–117)
BUN: 14 mg/dL (ref 6–23)
CO2: 30 mEq/L (ref 19–32)
Calcium: 9.3 mg/dL (ref 8.4–10.5)
Chloride: 101 mEq/L (ref 96–112)
Creatinine, Ser: 0.8 mg/dL (ref 0.40–1.20)
GFR: 89.89 mL/min (ref >60.00)
Glucose, Bld: 124 mg/dL — ABNORMAL HIGH (ref 70–99)
Potassium: 3.7 mEq/L (ref 3.5–5.1)
Sodium: 141 mEq/L (ref 135–145)
Total Bilirubin: 0.5 mg/dL (ref 0.2–1.2)
Total Protein: 6.7 g/dL (ref 6.0–8.3)

## 2019-05-14 LAB — POC URINALSYSI DIPSTICK (AUTOMATED)
Blood, UA: NEGATIVE
Glucose, UA: NEGATIVE
Ketones, UA: NEGATIVE
Nitrite, UA: NEGATIVE
Protein, UA: POSITIVE — AB
Spec Grav, UA: 1.015 (ref 1.010–1.025)
Urobilinogen, UA: 1 E.U./dL
pH, UA: 6.5 (ref 5.0–8.0)

## 2019-05-14 LAB — TSH: TSH: 1.36 u[IU]/mL (ref 0.35–4.50)

## 2019-05-14 LAB — VITAMIN B12: Vitamin B-12: >1515 pg/mL — ABNORMAL HIGH (ref 211–911)

## 2019-05-14 LAB — FOLATE: Folate: 19.2 ng/mL (ref >5.9)

## 2019-05-14 MED ORDER — CYCLOBENZAPRINE HCL 5 MG PO TABS: 5.0000 mg | ORAL_TABLET | Freq: Every day | ORAL | 1 refills | Status: DC

## 2019-05-14 MED ORDER — TRAMADOL HCL 50 MG PO TABS: 50.0000 mg | ORAL_TABLET | Freq: Three times a day (TID) | ORAL | 0 refills | Status: AC | PRN

## 2019-05-14 NOTE — Addendum Note (Signed)
Addended by: Caffie Pinto on: 05/14/2019 11:21 AM   Modules accepted: Orders

## 2019-05-14 NOTE — Addendum Note (Signed)
Addended by: Hinton Dyer on: 05/14/2019 01:27 PM   Modules accepted: Orders

## 2019-05-14 NOTE — Patient Instructions (Signed)
You do have recent bilateral hip pain as well as some left sciatic area pain.  I want you to continue your diclofenac which you were using for right shoulder pain.  Will add Flexeril to use 1 tablet at night.  Also prescribing a limited number of tramadol to use for moderate to severe pain.  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  In addition getting a right hip x-ray as well as lumbar spine x-ray.  Note written today.  Hopefully he will help some improvement by Monday.  If you need work note extended please let us know.  History of possible neuropathy as we discussed on last visit, future labs are replaced and you can get those today.  You did mention possible flank region pain early on during the interview.  For this reason we will go ahead and get POCT UA.  Brief discussion on kidney stones signs and symptoms.  I do not think you have those presently but if your symptoms worsen or change please let us know.  Follow-up and 10 days for virtual visit or sooner if needed.

## 2019-05-14 NOTE — Progress Notes (Signed)
Subjective:    Patient ID: Rhonda Morrow, female    DOB: [birth year], 58 y.o.   MRN: [id number]  HPI  Pt in for some rt hip pain. It came on 3 days ago. Some rt hip and left hip pain. But also points to flank area. When she moves she does have pain. She does have history of some kidney stones in the past.  Pt has left si tenderness recently. But no radiating pain.  Pt went to bathroom before I got in urine. But she will give sample.  Pt does have history of left hip replacement.   Pt has future labs for nerve pain discussed on last visit.  Pt bp is controlled today.  Pt is on diclofenac for her rt shoulder.     Review of Systems  Constitutional: Negative for chills and fatigue.  HENT: Negative for congestion and ear pain.   Respiratory: Negative for cough, chest tightness and wheezing.   Cardiovascular: Negative for chest pain and palpitations.  Gastrointestinal: Negative for abdominal distention, abdominal pain, blood in stool, nausea and vomiting.  Genitourinary: Negative for dysuria.  Musculoskeletal: Positive for back pain.       Hip pain  Skin: Negative for rash.  Neurological: Negative for dizziness, speech difficulty, numbness and headaches.  Hematological: Negative for adenopathy. Does not bruise/bleed easily.  Psychiatric/Behavioral: Negative for behavioral problems and confusion.    Past Medical History:  Diagnosis Date  . Arthritis   . Asthma   . Complication of anesthesia    one time woke up and was vey scared,17 yrs ago  . Deaf   . Diabetes mellitus without complication ([place])   . GERD (gastroesophageal reflux disease)   . Hypertension   . Left groin pain   . Thyroid disease      Social History   Socioeconomic History  . Marital status: Married    Spouse name: Not on file  . Number of children: Not on file  . Years of education: Not on file  . Highest education level: Not on file  Occupational History  . Occupation: disabled  Social Needs  .  Financial resource strain: Not on file  . Food insecurity    Worry: Not on file    Inability: Not on file  . Transportation needs    Medical: Not on file    Non-medical: Not on file  Tobacco Use  . Smoking status: Never Smoker  . Smokeless tobacco: Never Used  Substance and Sexual Activity  . Alcohol use: No    Alcohol/week: 0.0 standard drinks  . Drug use: No  . Sexual activity: Never  Lifestyle  . Physical activity    Days per week: Not on file    Minutes per session: Not on file  . Stress: Not on file  Relationships  . Social Herbalist on phone: Not on file    Gets together: Not on file    Attends religious service: Not on file    Active member of club or organization: Not on file    Attends meetings of clubs or organizations: Not on file    Relationship status: Not on file  . Intimate partner violence    Fear of current or ex partner: Not on file    Emotionally abused: Not on file    Physically abused: Not on file    Forced sexual activity: Not on file  Other Topics Concern  . Not on file  Social History Narrative  Lives with family.  Has 3 children.  Works for [employer].  Education: high school.    Past Surgical History:  Procedure Laterality Date  . CARDIAC CATHETERIZATION    . CESAREAN SECTION    . CHOLECYSTECTOMY N/A 06/20/2016   Procedure: LAPAROSCOPIC CHOLECYSTECTOMY;  Surgeon: Rolm Bookbinder, MD;  Location: [facility];  Service: General;  Laterality: N/A;  . ESOPHAGEAL MANOMETRY N/A 09/29/2013   Procedure: ESOPHAGEAL MANOMETRY (EM);  Surgeon: Milus Banister, MD;  Location: [facility];  Service: Endoscopy;  Laterality: N/A;  . TOTAL HIP ARTHROPLASTY Left 02/21/2017   Procedure: LEFT TOTAL HIP ARTHROPLASTY ANTERIOR APPROACH;  Surgeon: Leandrew Koyanagi, MD;  Location: [facility];  Service: Orthopedics;  Laterality: Left;    Family History  Problem Relation Age of Onset  . Diabetes Mother   . Heart disease Mother   . Diabetes Father     Allergies   Allergen Reactions  . Losartan Shortness Of Breath  . Oxycodone-Acetaminophen Nausea And Vomiting  . Augmentin [Amoxicillin-Pot Clavulanate] Diarrhea    Current Outpatient Medications on File Prior to Visit  Medication Sig Dispense Refill  . albuterol (PROVENTIL HFA;VENTOLIN HFA) 108 (90 Base) MCG/ACT inhaler Inhale 2 puffs into the lungs every 6 (six) hours as needed for wheezing. 1 Inhaler 5  . allopurinol (ZYLOPRIM) 100 MG tablet TAKE 1 TABLET BY MOUTH EVERY DAY (Patient taking differently: Take 100 mg by mouth daily. ) 90 tablet 2  . amLODipine (NORVASC) 5 MG tablet TAKE 1 TABLET BY MOUTH EVERY DAY 90 tablet 1  . aspirin EC 325 MG tablet Take 1 tablet (325 mg total) by mouth 2 (two) times daily. 84 tablet 0  . azelastine (ASTELIN) 0.1 % nasal spray Place 2 sprays into both nostrils 2 (two) times daily. Use in each nostril as directed 30 mL 3  . budesonide-formoterol (SYMBICORT) 160-4.5 MCG/ACT inhaler Inhale 2 puffs into the lungs every 12 (twelve) hours. 1 Inhaler 0  . cetirizine (ZYRTEC) 10 MG tablet Take 1 tablet (10 mg total) by mouth daily. No Therapeutic Substitution 90 tablet 1  . colchicine 0.6 MG tablet TAKE 1 TABLET BY MOUTH EVERY DAY 90 tablet 1  . Cyanocobalamin (B-12) 1000 MCG SUBL Dissolove 1 tablet under tongue once a day 90 each 1  . diclofenac (VOLTAREN) 75 MG EC tablet Take 1 tablet (75 mg total) by mouth 2 (two) times daily as needed. 60 tablet 3  . diclofenac sodium (VOLTAREN) 1 % GEL Apply 4 g topically 4 (four) times daily as needed. 500 g 6  . Fiber CHEW Chew 1 tablet by mouth daily.    . fluticasone (FLONASE) 50 MCG/ACT nasal spray PLACE 2 SPRAYS INTO BOTH NOSTRILS DAILY. 16 g 4  . glucose blood test strip Use as instructed--- one touch verio test strips 100 each 12  . hydrochlorothiazide (HYDRODIURIL) 12.5 MG tablet TAKE 1 TABLET BY MOUTH EVERY DAY 90 tablet 1  . hydrOXYzine (ATARAX/VISTARIL) 10 MG tablet 1-2 tab po hs prn itching causing insomnia 30 tablet 0  .  ipratropium-albuterol (DUONEB) 0.5-2.5 (3) MG/3ML SOLN Take 3 mLs by nebulization every 6 (six) hours as needed. 360 mL 3  . KLOR-CON M20 20 MEQ tablet TAKE 1 TABLET BY MOUTH EVERY DAY 90 tablet 1  . Lancets (ONETOUCH ULTRASOFT) lancets Use as instructed 100 each 12  . meclizine (ANTIVERT) 12.5 MG tablet Take 1 tablet (12.5 mg total) by mouth 3 (three) times daily as needed for dizziness. 30 tablet 0  . metaxalone (SKELAXIN) 800  MG tablet TAKE 1 TABLET BY MOUTH THREE TIMES A DAY (Patient taking differently: Take 800 mg by mouth 3 (three) times daily. ) 30 tablet 2  . metFORMIN (GLUCOPHAGE-XR) 500 MG 24 hr tablet TAKE 1 TABLET BY MOUTH EVERY DAY WITH BREAKFAST 90 tablet 1  . mometasone-formoterol (DULERA) 100-5 MCG/ACT AERO Inhale 2 puffs into the lungs 2 (two) times daily. 1 Inhaler PRN  . montelukast (SINGULAIR) 10 MG tablet Take 1 tablet (10 mg total) by mouth at bedtime. 90 tablet 3  . Multiple Vitamin (MULTIVITAMIN) tablet Take 1 tablet by mouth daily.    . NONFORMULARY OR COMPOUNDED ITEM Nebulizer   DX ASTHMA 1 each 0  . nystatin-triamcinolone (MYCOLOG II) cream Apply twice daily to lower chest area/upper abdomen 30 g 0  . ondansetron (ZOFRAN ODT) 4 MG disintegrating tablet Take 1 tablet (4 mg total) by mouth every 8 (eight) hours as needed for nausea or vomiting. 10 tablet 0  . pantoprazole (PROTONIX) 40 MG tablet Take 1 tablet (40 mg total) by mouth daily. 30 tablet 3  . ranitidine (ZANTAC) 150 MG tablet TAKE 1 TABLET BY MOUTH TWICE A DAY (Patient taking differently: Take 150 mg by mouth 2 (two) times daily. ) 180 tablet 1  . Spacer/Aero-Holding Chambers (AEROCHAMBER MV) inhaler Use as instructed 1 each 0  . traMADol (ULTRAM) 50 MG tablet TAKE 1-2 TABLETS (50-100 MG TOTAL) BY MOUTH AT BEDTIME AS NEEDED. 14 tablet 1  . triamcinolone cream (KENALOG) 0.1 % Apply bid to hands 30 g 0  . zolpidem (AMBIEN) 10 MG tablet TAKE 1 TABLET BY MOUTH AT BEDTIME AS NEEDED FOR SLEEP 15 tablet 1  .  cholestyramine (QUESTRAN) 4 GM/DOSE powder Take 1 packet (4 g total) by mouth daily for 30 days. 30 packet 3   No current facility-administered medications on file prior to visit.     BP 133/88   Pulse 82   Temp 98.4 F (36.9 C) (Oral)   Resp 16   Ht 5\' 2"  (1.575 m)   Wt 241 lb (109.3 kg)   LMP 05/20/2012   SpO2 99%   BMI 44.08 kg/m       Objective:   Physical Exam  General Appearance- Not in acute distress.    Chest and Lung Exam Auscultation: Breath sounds:-Normal. Clear even and unlabored. Adventitious sounds:- No Adventitious sounds.  Cardiovascular Auscultation:Rythm - Regular, rate and rythm. Heart Sounds -Normal heart sounds.  Abdomen Inspection:-Inspection Normal.  Palpation/Perucssion: Palpation and Percussion of the abdomen reveal- Non Tender, No Rebound tenderness, No rigidity(Guarding) and No Palpable abdominal masses.  Liver:-Normal.  Spleen:- Normal.   Back Mid lumbar spine tenderness to palpation. Left si area pain on palpation. Pain on straight leg lift. Pain on lateral movements and flexion/extension of the spine.  Hips- both sides tender to palpation. Rt hip pain on range of motion.    Lower ext neurologic  L5-S1 sensation intact bilaterally. Normal patellar reflexes bilaterally. No foot drop bilaterally.      Assessment & Plan:  You do have recent bilateral hip pain as well as some left sciatic area pain.  I want you to continue your diclofenac which you were using for right shoulder pain.  Will add Flexeril to use 1 tablet at night.  Also prescribing a limited number of tramadol to use for moderate to severe pain.  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  In addition  getting a right hip x-ray as well as lumbar spine x-ray.  Note written today.  Hopefully he will help some improvement by Monday.  If you need work note extended please let us know.  History of  possible neuropathy as we discussed on last visit, future labs are replaced and you can get those today.  You did mention possible flank region pain early on during the interview.  For this reason we will go ahead and get POCT UA.  Brief discussion on kidney stones signs and symptoms.  I do not think you have those presently but if your symptoms worsen or change please let us know.  Follow-up and 10 days for virtual visit or sooner if needed.  40 minutes spent with patient today.  50% of time spent counseling patient on diagnosis and treatment/plans.  Mackie Pai, PA-C

## 2019-05-14 NOTE — Telephone Encounter (Signed)
poct ua placed today.

## 2019-05-14 NOTE — Addendum Note (Signed)
Addended by: Kelle Darting A on: 05/14/2019 03:16 PM   Modules accepted: Orders

## 2019-05-14 NOTE — Addendum Note (Signed)
Addended by: Anabel Halon on: 05/14/2019 11:27 AM   Modules accepted: Orders

## 2019-05-15 LAB — URINE CULTURE
MICRO NUMBER:: 645974
SPECIMEN QUALITY:: ADEQUATE

## 2019-05-16 ENCOUNTER — Other Ambulatory Visit: Payer: Self-pay | Admitting: Family Medicine

## 2019-05-16 ENCOUNTER — Telehealth: Payer: Self-pay | Admitting: Family Medicine

## 2019-05-16 DIAGNOSIS — I10 Essential (primary) hypertension: Secondary | ICD-10-CM

## 2019-05-16 NOTE — Telephone Encounter (Signed)
Pt is calling and complaining her low back toward her kidney area is still bothering her. Please advise. Pt saw edward on 05/14/2019

## 2019-05-19 ENCOUNTER — Telehealth: Payer: Self-pay | Admitting: Pulmonary Disease

## 2019-05-19 ENCOUNTER — Telehealth: Payer: Self-pay | Admitting: Family Medicine

## 2019-05-19 NOTE — Telephone Encounter (Signed)
Patient dropped off FMLA paperwork - fwd to [employer] via interoffice mail -pr  

## 2019-05-19 NOTE — Telephone Encounter (Signed)
Patient is calling back for lab results Please advise CB- [phone]

## 2019-05-19 NOTE — Telephone Encounter (Signed)
Notes recorded by Hinton Dyer, CMA on 05/19/2019 at 9:12 AM EDT  Viewed by [name] on 05/16/2019 6:20 AM  ------   Notes recorded by Mackie Pai, PA-C on 05/15/2019 at 9:17 PM EDT  Very low bacteria count on urine culture. Normal amount that does not appear to be infected. How are you doing?

## 2019-05-20 LAB — VITAMIN B1: Vitamin B1 (Thiamine): <6 nmol/L — ABNORMAL LOW (ref 8–30)

## 2019-05-21 ENCOUNTER — Other Ambulatory Visit: Payer: Self-pay | Admitting: Medical

## 2019-05-22 ENCOUNTER — Telehealth: Payer: Self-pay

## 2019-05-22 ENCOUNTER — Ambulatory Visit: Payer: Federal, State, Local not specified - PPO | Admitting: Family Medicine

## 2019-05-22 ENCOUNTER — Other Ambulatory Visit: Payer: Self-pay

## 2019-05-22 NOTE — Telephone Encounter (Signed)
ERROR-- to close 

## 2019-05-23 ENCOUNTER — Encounter: Payer: Self-pay | Admitting: Family Medicine

## 2019-05-23 ENCOUNTER — Other Ambulatory Visit: Payer: Self-pay

## 2019-05-23 ENCOUNTER — Other Ambulatory Visit (HOSPITAL_COMMUNITY)
Admission: RE | Admit: 2019-05-23 | Discharge: 2019-05-23 | Disposition: A | Discharge status: Home / Self Care | Payer: Federal, State, Local not specified - PPO | Source: Ambulatory Visit | Attending: Family Medicine | Admitting: Family Medicine

## 2019-05-23 ENCOUNTER — Ambulatory Visit (INDEPENDENT_AMBULATORY_CARE_PROVIDER_SITE_OTHER): Payer: Federal, State, Local not specified - PPO | Admitting: Family Medicine

## 2019-05-23 VITALS — BP 125/53 | HR 75 | Temp 98.4°F | Resp 18 | Ht 62.0 in | Wt 242.8 lb

## 2019-05-23 DIAGNOSIS — M5442 Lumbago with sciatica, left side: Secondary | ICD-10-CM | POA: Insufficient documentation

## 2019-05-23 DIAGNOSIS — E1165 Type 2 diabetes mellitus with hyperglycemia: Secondary | ICD-10-CM | POA: Diagnosis not present

## 2019-05-23 DIAGNOSIS — R350 Frequency of micturition: Secondary | ICD-10-CM | POA: Insufficient documentation

## 2019-05-23 DIAGNOSIS — M5136 Other intervertebral disc degeneration, lumbar region: Secondary | ICD-10-CM

## 2019-05-23 DIAGNOSIS — E1169 Type 2 diabetes mellitus with other specified complication: Secondary | ICD-10-CM

## 2019-05-23 DIAGNOSIS — I10 Essential (primary) hypertension: Secondary | ICD-10-CM

## 2019-05-23 DIAGNOSIS — E1149 Type 2 diabetes mellitus with other diabetic neurological complication: Secondary | ICD-10-CM

## 2019-05-23 DIAGNOSIS — E785 Hyperlipidemia, unspecified: Secondary | ICD-10-CM | POA: Diagnosis not present

## 2019-05-23 LAB — POC URINALSYSI DIPSTICK (AUTOMATED)
Blood, UA: NEGATIVE
Glucose, UA: NEGATIVE
Ketones, UA: NEGATIVE
Leukocytes, UA: NEGATIVE
Nitrite, UA: NEGATIVE
Protein, UA: NEGATIVE
Spec Grav, UA: 1.015 (ref 1.010–1.025)
Urobilinogen, UA: 1 E.U./dL
pH, UA: 6 (ref 5.0–8.0)

## 2019-05-23 LAB — LIPID PANEL
Cholesterol: 142 mg/dL (ref 0–200)
HDL: 54 mg/dL (ref >39.00)
LDL Cholesterol: 72 mg/dL (ref 0–99)
NonHDL: 87.78
Total CHOL/HDL Ratio: 3
Triglycerides: 79 mg/dL (ref 0.0–149.0)
VLDL: 15.8 mg/dL (ref 0.0–40.0)

## 2019-05-23 LAB — HEMOGLOBIN A1C: Hgb A1c MFr Bld: 6.4 % (ref 4.6–6.5)

## 2019-05-23 MED ORDER — CYCLOBENZAPRINE HCL 5 MG PO TABS: 5.0000 mg | ORAL_TABLET | Freq: Every day | ORAL | 1 refills | Status: DC

## 2019-05-23 NOTE — Assessment & Plan Note (Addendum)
Lab Results  Component Value Date   HGBA1C 6.4 11/08/2018   Controlled con't meds

## 2019-05-23 NOTE — Assessment & Plan Note (Signed)
Encouraged heart healthy diet, increase exercise, avoid trans fats, consider a krill oil cap daily 

## 2019-05-23 NOTE — Patient Instructions (Signed)
Acute Back Pain, Adult Acute back pain is sudden and usually short-lived. It is often caused by an injury to the muscles and tissues in the back. The injury may result from:  A muscle or ligament getting overstretched or torn (strained). Ligaments are tissues that connect bones to each other. Lifting something improperly can cause a back strain.  Wear and tear (degeneration) of the spinal disks. Spinal disks are circular tissue that provides cushioning between the bones of the spine (vertebrae).  Twisting motions, such as while playing sports or doing yard work.  A hit to the back.  Arthritis. You may have a physical exam, lab tests, and imaging tests to find the cause of your pain. Acute back pain usually goes away with rest and home care. Follow these instructions at home: Managing pain, stiffness, and swelling  Take over-the-counter and prescription medicines only as told by your health care provider.  Your health care provider may recommend applying ice during the first 24-48 hours after your pain starts. To do this: ? Put ice in a plastic bag. ? Place a towel between your skin and the bag. ? Leave the ice on for 20 minutes, 2-3 times a day.  If directed, apply heat to the affected area as often as told by your health care provider. Use the heat source that your health care provider recommends, such as a moist heat pack or a heating pad. ? Place a towel between your skin and the heat source. ? Leave the heat on for 20-30 minutes. ? Remove the heat if your skin turns bright red. This is especially important if you are unable to feel pain, heat, or cold. You have a greater risk of getting burned. Activity   Do not stay in bed. Staying in bed for more than 1-2 days can delay your recovery.  Sit up and stand up straight. Avoid leaning forward when you sit, or hunching over when you stand. ? If you work at a desk, sit close to it so you do not need to lean over. Keep your chin tucked  in. Keep your neck drawn back, and keep your elbows bent at a right angle. Your arms should look like the letter "L." ? Sit high and close to the steering wheel when you drive. Add lower back (lumbar) support to your car seat, if needed.  Take short walks on even surfaces as soon as you are able. Try to increase the length of time you walk each day.  Do not sit, drive, or stand in one place for more than 30 minutes at a time. Sitting or standing for long periods of time can put stress on your back.  Do not drive or use heavy machinery while taking prescription pain medicine.  Use proper lifting techniques. When you bend and lift, use positions that put less stress on your back: ? Bend your knees. ? Keep the load close to your body. ? Avoid twisting.  Exercise regularly as told by your health care provider. Exercising helps your back heal faster and helps prevent back injuries by keeping muscles strong and flexible.  Work with a physical therapist to make a safe exercise program, as recommended by your health care provider. Do any exercises as told by your physical therapist. Lifestyle  Maintain a healthy weight. Extra weight puts stress on your back and makes it difficult to have good posture.  Avoid activities or situations that make you feel anxious or stressed. Stress and anxiety increase muscle   tension and can make back pain worse. Learn ways to manage anxiety and stress, such as through exercise. General instructions  Sleep on a firm mattress in a comfortable position. Try lying on your side with your knees slightly bent. If you lie on your back, put a pillow under your knees.  Follow your treatment plan as told by your health care provider. This may include: ? Cognitive or behavioral therapy. ? Acupuncture or massage therapy. ? Meditation or yoga. Contact a health care provider if:  You have pain that is not relieved with rest or medicine.  You have increasing pain going down  into your legs or buttocks.  Your pain does not improve after 2 weeks.  You have pain at night.  You lose weight without trying.  You have a fever or chills. Get help right away if:  You develop new bowel or bladder control problems.  You have unusual weakness or numbness in your arms or legs.  You develop nausea or vomiting.  You develop abdominal pain.  You feel faint. Summary  Acute back pain is sudden and usually short-lived.  Use proper lifting techniques. When you bend and lift, use positions that put less stress on your back.  Take over-the-counter and prescription medicines and apply heat or ice as directed by your health care provider. This information is not intended to replace advice given to you by your health care provider. Make sure you discuss any questions you have with your health care provider. Document Released: 10/23/2005 Document Revised: 02/11/2019 Document Reviewed: 06/06/2017 Elsevier Patient Education  2020 [employer]  

## 2019-05-23 NOTE — Progress Notes (Signed)
Patient ID: Rhonda Morrow, female    DOB: 07/17/1961  Age: 58 y.o. MRN: [id number]    Subjective:  Subjective  HPI  Rhonda Morrow presents for f/u back pain and urine frequency  She states the pain is getting worse --  And goes down her L leg to her knee.  No urinary incontinence but +frequency.  We also need labs and f/u bp and cholesterol    No other complaints  Last ov reviewed and xrays   Review of Systems  Constitutional: Negative for appetite change, diaphoresis, fatigue and unexpected weight change.  Eyes: Negative for pain, redness and visual disturbance.  Respiratory: Negative for cough, chest tightness, shortness of breath and wheezing.   Cardiovascular: Negative for chest pain, palpitations and leg swelling.  Endocrine: Negative for cold intolerance, heat intolerance, polydipsia, polyphagia and polyuria.  Genitourinary: Positive for frequency. Negative for difficulty urinating and dysuria.  Musculoskeletal: Positive for back pain and gait problem.  Neurological: Negative for dizziness, weakness, light-headedness, numbness and headaches.    History Past Medical History:  Diagnosis Date  . Arthritis   . Asthma   . Complication of anesthesia    one time woke up and was vey scared,17 yrs ago  . Deaf   . Diabetes mellitus without complication ([place])   . GERD (gastroesophageal reflux disease)   . Hypertension   . Left groin pain   . Thyroid disease     She has a past surgical history that includes Cesarean section; Cardiac catheterization; Esophageal manometry (N/A, 09/29/2013); Cholecystectomy (N/A, 06/20/2016); and Total hip arthroplasty (Left, 02/21/2017).   Her family history includes Diabetes in her father and mother; Heart disease in her mother.She reports that she has never smoked. She has never used smokeless tobacco. She reports that she does not drink alcohol or use drugs.  Current Outpatient Medications on File Prior to Visit  Medication Sig Dispense Refill  .  albuterol (PROVENTIL HFA;VENTOLIN HFA) 108 (90 Base) MCG/ACT inhaler Inhale 2 puffs into the lungs every 6 (six) hours as needed for wheezing. 1 Inhaler 5  . allopurinol (ZYLOPRIM) 100 MG tablet TAKE 1 TABLET BY MOUTH EVERY DAY (Patient taking differently: Take 100 mg by mouth daily. ) 90 tablet 2  . amLODipine (NORVASC) 5 MG tablet TAKE 1 TABLET BY MOUTH EVERY DAY 90 tablet 1  . aspirin EC 325 MG tablet Take 1 tablet (325 mg total) by mouth 2 (two) times daily. 84 tablet 0  . azelastine (ASTELIN) 0.1 % nasal spray Place 2 sprays into both nostrils 2 (two) times daily. Use in each nostril as directed 30 mL 3  . budesonide-formoterol (SYMBICORT) 160-4.5 MCG/ACT inhaler Inhale 2 puffs into the lungs every 12 (twelve) hours. 1 Inhaler 0  . cetirizine (ZYRTEC) 10 MG tablet Take 1 tablet (10 mg total) by mouth daily. No Therapeutic Substitution 90 tablet 1  . colchicine 0.6 MG tablet TAKE 1 TABLET BY MOUTH EVERY DAY 90 tablet 1  . Cyanocobalamin (B-12) 1000 MCG SUBL Dissolove 1 tablet under tongue once a day 90 each 1  . diclofenac (VOLTAREN) 75 MG EC tablet Take 1 tablet (75 mg total) by mouth 2 (two) times daily as needed. 60 tablet 3  . diclofenac sodium (VOLTAREN) 1 % GEL Apply 4 g topically 4 (four) times daily as needed. 500 g 6  . Fiber CHEW Chew 1 tablet by mouth daily.    . fluticasone (FLONASE) 50 MCG/ACT nasal spray PLACE 2 SPRAYS INTO BOTH NOSTRILS DAILY. 16 g  4  . glucose blood test strip Use as instructed--- one touch verio test strips 100 each 12  . hydrochlorothiazide (HYDRODIURIL) 12.5 MG tablet TAKE 1 TABLET BY MOUTH EVERY DAY 90 tablet 1  . hydrOXYzine (ATARAX/VISTARIL) 10 MG tablet TAKE 1 TO 2 TABLETS BY MOUTH AT BEDTIME AS NEEDED FOR ITCHING 180 tablet 1  . ipratropium-albuterol (DUONEB) 0.5-2.5 (3) MG/3ML SOLN Take 3 mLs by nebulization every 6 (six) hours as needed. 360 mL 3  . KLOR-CON M20 20 MEQ tablet TAKE 1 TABLET BY MOUTH EVERY DAY 90 tablet 1  . Lancets (ONETOUCH ULTRASOFT)  lancets Use as instructed 100 each 12  . meclizine (ANTIVERT) 12.5 MG tablet Take 1 tablet (12.5 mg total) by mouth 3 (three) times daily as needed for dizziness. 30 tablet 0  . metaxalone (SKELAXIN) 800 MG tablet TAKE 1 TABLET BY MOUTH THREE TIMES A DAY (Patient taking differently: Take 800 mg by mouth 3 (three) times daily. ) 30 tablet 2  . metFORMIN (GLUCOPHAGE-XR) 500 MG 24 hr tablet TAKE 1 TABLET BY MOUTH EVERY DAY WITH BREAKFAST 90 tablet 1  . mometasone-formoterol (DULERA) 100-5 MCG/ACT AERO Inhale 2 puffs into the lungs 2 (two) times daily. 1 Inhaler PRN  . montelukast (SINGULAIR) 10 MG tablet Take 1 tablet (10 mg total) by mouth at bedtime. 90 tablet 3  . Multiple Vitamin (MULTIVITAMIN) tablet Take 1 tablet by mouth daily.    . NONFORMULARY OR COMPOUNDED ITEM Nebulizer   DX ASTHMA 1 each 0  . nystatin-triamcinolone (MYCOLOG II) cream Apply twice daily to lower chest area/upper abdomen 30 g 0  . ondansetron (ZOFRAN ODT) 4 MG disintegrating tablet Take 1 tablet (4 mg total) by mouth every 8 (eight) hours as needed for nausea or vomiting. 10 tablet 0  . pantoprazole (PROTONIX) 40 MG tablet Take 1 tablet (40 mg total) by mouth daily. 30 tablet 3  . ranitidine (ZANTAC) 150 MG tablet TAKE 1 TABLET BY MOUTH TWICE A DAY (Patient taking differently: Take 150 mg by mouth 2 (two) times daily. ) 180 tablet 1  . Spacer/Aero-Holding Chambers (AEROCHAMBER MV) inhaler Use as instructed 1 each 0  . traMADol (ULTRAM) 50 MG tablet TAKE 1-2 TABLETS (50-100 MG TOTAL) BY MOUTH AT BEDTIME AS NEEDED. 14 tablet 1  . triamcinolone cream (KENALOG) 0.1 % Apply bid to hands 30 g 0  . zolpidem (AMBIEN) 10 MG tablet TAKE 1 TABLET BY MOUTH AT BEDTIME AS NEEDED FOR SLEEP 15 tablet 1  . cholestyramine (QUESTRAN) 4 GM/DOSE powder Take 1 packet (4 g total) by mouth daily for 30 days. 30 packet 3   No current facility-administered medications on file prior to visit.      Objective:  Objective  Physical Exam Vitals signs  and nursing note reviewed.  Constitutional:      Appearance: She is well-developed.  HENT:     Head: Normocephalic and atraumatic.  Eyes:     Conjunctiva/sclera: Conjunctivae normal.  Neck:     Musculoskeletal: Normal range of motion and neck supple.     Thyroid: No thyromegaly.     Vascular: No carotid bruit or JVD.  Cardiovascular:     Rate and Rhythm: Normal rate and regular rhythm.     Heart sounds: Normal heart sounds. No murmur.  Pulmonary:     Effort: Pulmonary effort is normal. No respiratory distress.     Breath sounds: Normal breath sounds. No wheezing or rales.  Chest:     Chest wall: No tenderness.  Abdominal:  Tenderness: There is abdominal tenderness. There is left CVA tenderness. There is no guarding or rebound.  Musculoskeletal: Normal range of motion.        General: Tenderness present.     Lumbar back: She exhibits tenderness, pain and spasm. She exhibits normal range of motion.       Back:  Neurological:     Mental Status: She is alert and oriented to person, place, and time.    BP (!) 125/53 (BP Location: Right Arm, Patient Position: Sitting, Cuff Size: Normal)   Pulse 75   Temp 98.4 F (36.9 C) (Oral)   Resp 18   Ht 5\' 2"  (1.575 m)   Wt 242 lb 12.8 oz (110.1 kg)   LMP 05/20/2012   SpO2 99%   BMI 44.41 kg/m  Wt Readings from Last 3 Encounters:  05/23/19 242 lb 12.8 oz (110.1 kg)  05/14/19 241 lb (109.3 kg)  01/22/19 240 lb (108.9 kg)     Lab Results  Component Value Date   WBC 5.1 05/14/2019   HGB 14.3 05/14/2019   HCT 44.6 05/14/2019   PLT 161.0 05/14/2019   GLUCOSE 124 (H) 05/14/2019   CHOL 156 03/28/2018   TRIG 71.0 03/28/2018   HDL 56.20 03/28/2018   LDLCALC 85 03/28/2018   ALT 32 05/14/2019   AST 22 05/14/2019   NA 141 05/14/2019   K 3.7 05/14/2019   CL 101 05/14/2019   CREATININE 0.80 05/14/2019   BUN 14 05/14/2019   CO2 30 05/14/2019   TSH 1.36 05/14/2019   INR 1.05 02/12/2017   HGBA1C 6.4 11/08/2018   MICROALBUR 0.8  10/27/2015    Dg Lumbar Spine 2-3 Views  Result Date: 05/14/2019 CLINICAL DATA:  Acute left-sided low back pain with left-sided sciatica. EXAM: LUMBAR SPINE - 2-3 VIEW COMPARISON:  CT scan of October 18, 2018. FINDINGS: No fracture or spondylolisthesis is noted. Mild degenerative disc disease is noted at L3-4. Anterior osteophyte formation is noted at L1-2, L2-3 and L4-5. IMPRESSION: Mild multilevel degenerative changes as described above. No acute abnormality seen in the lumbar spine. Electronically Signed   By: Marijo Conception M.D.   On: 05/14/2019 12:48   Dg Hips Bilat With Pelvis Min 5 Views  Result Date: 05/14/2019 CLINICAL DATA:  Bilateral hip pain. EXAM: DG HIP (WITH OR WITHOUT PELVIS) 5+V BILAT COMPARISON:  None. FINDINGS: There is no evidence of hip fracture or dislocation. Status post left total hip arthroplasty. No significant degenerative changes seen involving the right hip. IMPRESSION: Normal right hip. Status post left total hip arthroplasty. No fracture or dislocation is noted. Electronically Signed   By: Marijo Conception M.D.   On: 05/14/2019 12:50     Assessment & Plan:  Plan  I am having Hellen A. Owens Shark maintain her multivitamin, Fiber, NONFORMULARY OR COMPOUNDED ITEM, azelastine, glucose blood, aspirin EC, albuterol, zolpidem, metaxalone, onetouch ultrasoft, meclizine, ranitidine, allopurinol, ondansetron, diclofenac, diclofenac sodium, mometasone-formoterol, montelukast, ipratropium-albuterol, fluticasone, budesonide-formoterol, AeroChamber MV, Klor-Con M20, cholestyramine, colchicine, pantoprazole, metFORMIN, traMADol, B-12, cetirizine, hydrochlorothiazide, triamcinolone cream, nystatin-triamcinolone, amLODipine, hydrOXYzine, and cyclobenzaprine.  Meds ordered this encounter  Medications  . cyclobenzaprine (FLEXERIL) 5 MG tablet    Sig: Take 1 tablet (5 mg total) by mouth at bedtime.    Dispense:  10 tablet    Refill:  1    Problem List Items Addressed This Visit       Unprioritized   Back pain - Primary   Relevant Medications   cyclobenzaprine (FLEXERIL) 5 MG tablet  Other Relevant Orders   POCT Urinalysis Dipstick (Automated) (Completed)   Urine cytology ancillary only([place])   DM (diabetes mellitus) type II controlled, neurological manifestation ([place])    Lab Results  Component Value Date   HGBA1C 6.4 11/08/2018   Controlled con't meds       HTN (hypertension)    Well controlled, no changes to meds. Encouraged heart healthy diet such as the DASH diet and exercise as tolerated.       Hyperlipidemia    Encouraged heart healthy diet, increase exercise, avoid trans fats, consider a krill oil cap daily       Other Visit Diagnoses    Urinary frequency       Relevant Orders   POCT Urinalysis Dipstick (Automated) (Completed)   Urine cytology ancillary only([place])   Urine Culture   Other intervertebral disc degeneration, lumbar region       Relevant Medications   cyclobenzaprine (FLEXERIL) 5 MG tablet   Other Relevant Orders   MR Lumbar Spine Wo Contrast   Hyperlipidemia associated with type 2 diabetes mellitus ([place])       Relevant Orders   Hemoglobin A1c   Lipid panel   Uncontrolled type 2 diabetes mellitus with hyperglycemia ([facility])       Relevant Orders   Hemoglobin A1c   Lipid panel      Follow-up: Return if symptoms worsen or fail to improve.  Ann Held, DO

## 2019-05-23 NOTE — Assessment & Plan Note (Signed)
Well controlled, no changes to meds. Encouraged heart healthy diet such as the DASH diet and exercise as tolerated.  °

## 2019-05-25 LAB — URINE CULTURE
MICRO NUMBER:: 678828
Result:: NO GROWTH
SPECIMEN QUALITY:: ADEQUATE

## 2019-05-26 ENCOUNTER — Ambulatory Visit: Payer: Federal, State, Local not specified - PPO | Admitting: Family Medicine

## 2019-05-27 ENCOUNTER — Telehealth: Payer: Self-pay | Admitting: Family Medicine

## 2019-05-27 DIAGNOSIS — M5442 Lumbago with sciatica, left side: Secondary | ICD-10-CM

## 2019-05-27 DIAGNOSIS — Z889 Allergy status to unspecified drugs, medicaments and biological substances status: Secondary | ICD-10-CM

## 2019-05-27 DIAGNOSIS — I10 Essential (primary) hypertension: Secondary | ICD-10-CM

## 2019-05-27 LAB — URINE CYTOLOGY ANCILLARY ONLY
Bacterial vaginitis: NEGATIVE
Candida vaginitis: NEGATIVE
Chlamydia: NEGATIVE
Neisseria Gonorrhea: NEGATIVE
Trichomonas: NEGATIVE

## 2019-05-27 NOTE — Telephone Encounter (Signed)
Copied from [place] [id number]. Topic: Quick Communication - Rx Refill/Question >> May 27, 2019  1:31 PM Rhonda Morrow, Marland Kitchen wrote: Medication: zolpidem (AMBIEN) 10 MG tablet amLODipine (NORVASC) 5 MG tablet cetirizine (ZYRTEC) 10 MG tablet colchicine 0.6 MG tablet cyclobenzaprine (FLEXERIL) 5 MG tablet diclofenac (VOLTAREN) 75 MG EC tablet  Has the patient contacted their pharmacy? No. (Agent: If no, request that the patient contact the pharmacy for the refill.) (Agent: If yes, when and what did the pharmacy advise?)  Preferred Pharmacy (with phone number or street name): [facility] [address]   Pt says that her kids threw out her medications while cleaning, and pt needs refills on all the meds listed  Agent: Please be advised that RX refills may take up to 3 business days. We ask that you follow-up with your pharmacy.

## 2019-05-28 ENCOUNTER — Telehealth: Payer: Self-pay

## 2019-05-28 ENCOUNTER — Other Ambulatory Visit: Payer: Self-pay | Admitting: Family Medicine

## 2019-05-28 DIAGNOSIS — F5101 Primary insomnia: Secondary | ICD-10-CM

## 2019-05-28 MED ORDER — ZOLPIDEM TARTRATE 10 MG PO TABS: 10.0000 mg | ORAL_TABLET | Freq: Every evening | ORAL | 1 refills | Status: DC | PRN

## 2019-05-28 NOTE — Telephone Encounter (Signed)
Copied from [place] [id number]. Topic: Referral - Question >> May 27, 2019  1:38 PM Nils Flack wrote: Reason for CRM: pt is asking if her mri scheduled for 06/19/19 can be moved to a sooner date.

## 2019-05-28 NOTE — Telephone Encounter (Signed)
Please ask gwen---  Maybe she can have it somewhere else?

## 2019-05-28 NOTE — Telephone Encounter (Signed)
I sent it in 

## 2019-05-28 NOTE — Telephone Encounter (Signed)
Requesting: Ambien Contract: 11/07/2016 UDS: 02/13/2019, low risk Last OV: 05/23/2019 Next OV: N/A Last Refill: 08/10/2017, #15--1 RF Database:  ** Please review telephone encounter**  Please advise

## 2019-05-30 ENCOUNTER — Other Ambulatory Visit: Payer: Self-pay

## 2019-05-30 ENCOUNTER — Ambulatory Visit
Admission: RE | Admit: 2019-05-30 | Discharge: 2019-05-30 | Disposition: A | Discharge status: Home / Self Care | Payer: Federal, State, Local not specified - PPO | Source: Ambulatory Visit | Attending: Family Medicine | Admitting: Family Medicine

## 2019-05-30 DIAGNOSIS — G8929 Other chronic pain: Secondary | ICD-10-CM

## 2019-05-30 MED ORDER — COLCHICINE 0.6 MG PO TABS: 0.6000 mg | ORAL_TABLET | Freq: Every day | ORAL | 1 refills | Status: DC

## 2019-06-03 ENCOUNTER — Telehealth: Payer: Self-pay | Admitting: Family Medicine

## 2019-06-03 MED ORDER — DICLOFENAC SODIUM 75 MG PO TBEC: 75.0000 mg | DELAYED_RELEASE_TABLET | Freq: Two times a day (BID) | ORAL | 3 refills | Status: DC | PRN

## 2019-06-03 MED ORDER — DIAZEPAM 5 MG PO TABS: ORAL_TABLET | ORAL | 0 refills | Status: DC

## 2019-06-03 NOTE — Telephone Encounter (Signed)
Please advise 

## 2019-06-03 NOTE — Telephone Encounter (Signed)
I notified patient earlier to just check with the pharmacy later for this. I will only call her if there is a problem.

## 2019-06-03 NOTE — Telephone Encounter (Signed)
Sent!

## 2019-06-03 NOTE — Telephone Encounter (Signed)
She would also like a refill on her diclofenac.

## 2019-06-03 NOTE — Addendum Note (Signed)
Addended by: Hortencia Pilar on: 06/03/2019 01:58 PM   Modules accepted: Orders

## 2019-06-03 NOTE — Telephone Encounter (Signed)
Rx sent 

## 2019-06-03 NOTE — Telephone Encounter (Signed)
Patient called needing something for anxiety for the MRI. Pt request for this to be called in.  Please call patient to advise.  [phone]

## 2019-06-04 ENCOUNTER — Other Ambulatory Visit: Payer: Self-pay | Admitting: Medical

## 2019-06-05 NOTE — Telephone Encounter (Signed)
Requesting: Tramadol  Contract: 11/07/2016 UDS: 02/13/2019, low risk Last OV: 05/23/2019 Next OV: N/A Last Refill: 03/18/2019, #14--1 RF Database:   Please advise

## 2019-06-16 ENCOUNTER — Ambulatory Visit: Payer: Federal, State, Local not specified - PPO | Admitting: Family Medicine

## 2019-06-18 ENCOUNTER — Other Ambulatory Visit: Payer: Self-pay

## 2019-06-18 ENCOUNTER — Telehealth: Payer: Self-pay

## 2019-06-18 DIAGNOSIS — M549 Dorsalgia, unspecified: Secondary | ICD-10-CM

## 2019-06-18 NOTE — Telephone Encounter (Signed)
Pt called- she doesn't want her MRI at [place] she is requesting it to be done at [place] w/ [facility] in [place] because they have a bigger MRI machine. Instructed her to call and cancel her appt w/ [facility] for tomorrow and I would send a message to have MRI location changed.   [facility]- [address], [place], [place] [address]  Telephone: [phone]

## 2019-06-18 NOTE — Progress Notes (Signed)
error 

## 2019-06-19 ENCOUNTER — Other Ambulatory Visit: Payer: Federal, State, Local not specified - PPO

## 2019-06-24 DIAGNOSIS — M47816 Spondylosis without myelopathy or radiculopathy, lumbar region: Secondary | ICD-10-CM | POA: Diagnosis not present

## 2019-06-24 DIAGNOSIS — M5127 Other intervertebral disc displacement, lumbosacral region: Secondary | ICD-10-CM | POA: Diagnosis not present

## 2019-07-12 ENCOUNTER — Other Ambulatory Visit: Payer: Self-pay | Admitting: Family Medicine

## 2019-07-12 DIAGNOSIS — E1151 Type 2 diabetes mellitus with diabetic peripheral angiopathy without gangrene: Secondary | ICD-10-CM

## 2019-07-12 DIAGNOSIS — IMO0002 Reserved for concepts with insufficient information to code with codable children: Secondary | ICD-10-CM

## 2019-07-12 DIAGNOSIS — M25511 Pain in right shoulder: Secondary | ICD-10-CM

## 2019-07-15 ENCOUNTER — Ambulatory Visit (INDEPENDENT_AMBULATORY_CARE_PROVIDER_SITE_OTHER): Payer: Federal, State, Local not specified - PPO | Admitting: Family Medicine

## 2019-07-15 ENCOUNTER — Telehealth: Payer: Self-pay | Admitting: Specialist

## 2019-07-15 ENCOUNTER — Encounter: Payer: Self-pay | Admitting: Family Medicine

## 2019-07-15 ENCOUNTER — Other Ambulatory Visit: Payer: Self-pay

## 2019-07-15 VITALS — BP 134/90 | HR 89 | Temp 97.8°F | Resp 18 | Ht 62.0 in | Wt 241.6 lb

## 2019-07-15 DIAGNOSIS — G8929 Other chronic pain: Secondary | ICD-10-CM | POA: Diagnosis not present

## 2019-07-15 DIAGNOSIS — Z1239 Encounter for other screening for malignant neoplasm of breast: Secondary | ICD-10-CM

## 2019-07-15 DIAGNOSIS — M5441 Lumbago with sciatica, right side: Secondary | ICD-10-CM

## 2019-07-15 DIAGNOSIS — M5442 Lumbago with sciatica, left side: Secondary | ICD-10-CM

## 2019-07-15 DIAGNOSIS — Z23 Encounter for immunization: Secondary | ICD-10-CM | POA: Diagnosis not present

## 2019-07-15 NOTE — Patient Instructions (Signed)

## 2019-07-15 NOTE — Assessment & Plan Note (Signed)
   MRI reviewed with pt  Referral to ortho  rto prn

## 2019-07-15 NOTE — Progress Notes (Signed)
Patient ID: Rhonda Morrow, female    DOB: 1961-05-05  Age: 58 y.o. MRN: [id number]    Subjective:  Subjective  HPI DIAMONE KARRAKER presents for f/u of her MRI done at [facility].   Results reviewed in Care everywhere  Pt is still having low back pain that radiates down both legs to thigh.  Interpreter is with her.    Review of Systems  Constitutional: Negative for activity change, appetite change, fatigue and unexpected weight change.  Respiratory: Negative for cough and shortness of breath.   Cardiovascular: Negative for chest pain and palpitations.  Musculoskeletal: Positive for back pain.  Psychiatric/Behavioral: Negative for behavioral problems and dysphoric mood. The patient is not nervous/anxious.     History Past Medical History:  Diagnosis Date  . Arthritis   . Asthma   . Complication of anesthesia    one time woke up and was vey scared,17 yrs ago  . Deaf   . Diabetes mellitus without complication ([place])   . GERD (gastroesophageal reflux disease)   . Hypertension   . Left groin pain   . Thyroid disease     She has a past surgical history that includes Cesarean section; Cardiac catheterization; Esophageal manometry (N/A, 09/29/2013); Cholecystectomy (N/A, 06/20/2016); and Total hip arthroplasty (Left, 02/21/2017).   Her family history includes Diabetes in her father and mother; Heart disease in her mother.She reports that she has never smoked. She has never used smokeless tobacco. She reports that she does not drink alcohol or use drugs.  Current Outpatient Medications on File Prior to Visit  Medication Sig Dispense Refill  . albuterol (PROVENTIL HFA;VENTOLIN HFA) 108 (90 Base) MCG/ACT inhaler Inhale 2 puffs into the lungs every 6 (six) hours as needed for wheezing. 1 Inhaler 5  . allopurinol (ZYLOPRIM) 100 MG tablet TAKE 1 TABLET BY MOUTH EVERY DAY (Patient taking differently: Take 100 mg by mouth daily. ) 90 tablet 2  . amLODipine (NORVASC) 5 MG tablet TAKE 1 TABLET BY MOUTH  EVERY DAY 90 tablet 1  . aspirin EC 325 MG tablet Take 1 tablet (325 mg total) by mouth 2 (two) times daily. 84 tablet 0  . azelastine (ASTELIN) 0.1 % nasal spray Place 2 sprays into both nostrils 2 (two) times daily. Use in each nostril as directed 30 mL 3  . budesonide-formoterol (SYMBICORT) 160-4.5 MCG/ACT inhaler Inhale 2 puffs into the lungs every 12 (twelve) hours. 1 Inhaler 0  . cetirizine (ZYRTEC) 10 MG tablet Take 1 tablet (10 mg total) by mouth daily. No Therapeutic Substitution 90 tablet 1  . colchicine 0.6 MG tablet Take 1 tablet (0.6 mg total) by mouth daily. 90 tablet 1  . Cyanocobalamin (B-12) 1000 MCG SUBL Dissolove 1 tablet under tongue once a day 90 each 1  . cyclobenzaprine (FLEXERIL) 5 MG tablet Take 1 tablet (5 mg total) by mouth at bedtime. 10 tablet 1  . diazepam (VALIUM) 5 MG tablet 1 PO 1 hour before MRI, repeat prn 5 tablet 0  . diclofenac (VOLTAREN) 75 MG EC tablet Take 1 tablet (75 mg total) by mouth 2 (two) times daily as needed. 60 tablet 3  . diclofenac sodium (VOLTAREN) 1 % GEL Apply 4 g topically 4 (four) times daily as needed. 500 g 6  . Fiber CHEW Chew 1 tablet by mouth daily.    . fluticasone (FLONASE) 50 MCG/ACT nasal spray PLACE 2 SPRAYS INTO BOTH NOSTRILS DAILY. 16 g 4  . glucose blood test strip Use as instructed--- one touch verio  test strips 100 each 12  . hydrochlorothiazide (HYDRODIURIL) 12.5 MG tablet TAKE 1 TABLET BY MOUTH EVERY DAY 90 tablet 1  . hydrOXYzine (ATARAX/VISTARIL) 10 MG tablet TAKE 1 TO 2 TABLETS BY MOUTH AT BEDTIME AS NEEDED FOR ITCHING 180 tablet 1  . ipratropium-albuterol (DUONEB) 0.5-2.5 (3) MG/3ML SOLN Take 3 mLs by nebulization every 6 (six) hours as needed. 360 mL 3  . KLOR-CON M20 20 MEQ tablet TAKE 1 TABLET BY MOUTH EVERY DAY 90 tablet 1  . Lancets (ONETOUCH ULTRASOFT) lancets Use as instructed 100 each 12  . meclizine (ANTIVERT) 12.5 MG tablet Take 1 tablet (12.5 mg total) by mouth 3 (three) times daily as needed for dizziness.  30 tablet 0  . metaxalone (SKELAXIN) 800 MG tablet TAKE 1 TABLET BY MOUTH THREE TIMES A DAY (Patient taking differently: Take 800 mg by mouth 3 (three) times daily. ) 30 tablet 2  . metFORMIN (GLUCOPHAGE-XR) 500 MG 24 hr tablet TAKE 1 TABLET BY MOUTH EVERY DAY WITH BREAKFAST 90 tablet 1  . mometasone-formoterol (DULERA) 100-5 MCG/ACT AERO Inhale 2 puffs into the lungs 2 (two) times daily. 1 Inhaler PRN  . montelukast (SINGULAIR) 10 MG tablet Take 1 tablet (10 mg total) by mouth at bedtime. 90 tablet 3  . Multiple Vitamin (MULTIVITAMIN) tablet Take 1 tablet by mouth daily.    . NONFORMULARY OR COMPOUNDED ITEM Nebulizer   DX ASTHMA 1 each 0  . nystatin-triamcinolone (MYCOLOG II) cream Apply twice daily to lower chest area/upper abdomen 30 g 0  . ondansetron (ZOFRAN ODT) 4 MG disintegrating tablet Take 1 tablet (4 mg total) by mouth every 8 (eight) hours as needed for nausea or vomiting. 10 tablet 0  . pantoprazole (PROTONIX) 40 MG tablet Take 1 tablet (40 mg total) by mouth daily. 30 tablet 3  . ranitidine (ZANTAC) 150 MG tablet TAKE 1 TABLET BY MOUTH TWICE A DAY (Patient taking differently: Take 150 mg by mouth 2 (two) times daily. ) 180 tablet 1  . Spacer/Aero-Holding Chambers (AEROCHAMBER MV) inhaler Use as instructed 1 each 0  . traMADol (ULTRAM) 50 MG tablet TAKE 1 TABLET (50 MG TOTAL) BY MOUTH EVERY 8 (EIGHT) HOURS AS NEEDED FOR UP TO 5 DAYS. 16 tablet 0  . triamcinolone cream (KENALOG) 0.1 % Apply bid to hands 30 g 0  . zolpidem (AMBIEN) 10 MG tablet Take 1 tablet (10 mg total) by mouth at bedtime as needed. for sleep 15 tablet 1  . cholestyramine (QUESTRAN) 4 GM/DOSE powder Take 1 packet (4 g total) by mouth daily for 30 days. 30 packet 3   No current facility-administered medications on file prior to visit.      Objective:  Objective  Physical Exam Vitals signs and nursing note reviewed.  Constitutional:      Appearance: Normal appearance. She is obese.  Neurological:     Mental  Status: She is alert.  Psychiatric:        Mood and Affect: Mood normal.        Thought Content: Thought content normal.        Judgment: Judgment normal.    BP 134/90 (BP Location: Right Arm, Patient Position: Sitting, Cuff Size: Normal)   Pulse 89   Temp 97.8 F (36.6 C) (Temporal)   Resp 18   Ht 5\' 2"  (1.575 m)   Wt 241 lb 9.6 oz (109.6 kg)   LMP 05/20/2012   SpO2 98%   BMI 44.19 kg/m   Mri--- done at [facility]-- is in  Care Everywhere Bulging disc L3-4, L4 -L5, L5-S1 degenerative changes   Lab Results  Component Value Date   WBC 5.1 05/14/2019   HGB 14.3 05/14/2019   HCT 44.6 05/14/2019   PLT 161.0 05/14/2019   GLUCOSE 124 (H) 05/14/2019   CHOL 142 05/23/2019   TRIG 79.0 05/23/2019   HDL 54.00 05/23/2019   LDLCALC 72 05/23/2019   ALT 32 05/14/2019   AST 22 05/14/2019   NA 141 05/14/2019   K 3.7 05/14/2019   CL 101 05/14/2019   CREATININE 0.80 05/14/2019   BUN 14 05/14/2019   CO2 30 05/14/2019   TSH 1.36 05/14/2019   INR 1.05 02/12/2017   HGBA1C 6.4 05/23/2019   MICROALBUR 0.8 10/27/2015    No results found.   Assessment & Plan:  Plan  I am having Kerilyn A. Owens Shark maintain her multivitamin, Fiber, NONFORMULARY OR COMPOUNDED ITEM, azelastine, glucose blood, aspirin EC, albuterol, metaxalone, onetouch ultrasoft, meclizine, ranitidine, allopurinol, ondansetron, diclofenac sodium, mometasone-formoterol, montelukast, ipratropium-albuterol, fluticasone, budesonide-formoterol, AeroChamber MV, Klor-Con M20, cholestyramine, pantoprazole, B-12, cetirizine, hydrochlorothiazide, triamcinolone cream, nystatin-triamcinolone, amLODipine, hydrOXYzine, cyclobenzaprine, zolpidem, colchicine, diazepam, diclofenac, traMADol, and metFORMIN.  No orders of the defined types were placed in this encounter.   Problem List Items Addressed This Visit      Unprioritized   Back pain     MRI reviewed with pt  Referral to ortho  rto prn       Relevant Orders   Ambulatory referral  to Orthopedic Surgery    Other Visit Diagnoses    Need for immunization against influenza    -  Primary   Relevant Orders   Flu Vaccine QUAD 6+ mos PF IM (Fluarix Quad PF) (Completed)   Breast cancer screening       Relevant Orders   MM 3D SCREEN BREAST BILATERAL      Follow-up: Return if symptoms worsen or fail to improve.  Ann Held, DO

## 2019-07-15 NOTE — Telephone Encounter (Signed)
I have put her on the cancellation list

## 2019-07-15 NOTE — Telephone Encounter (Signed)
I scheduled this pt for a new back appt on 09-03-2019 with dr.nitka and pt is wanting to go on the cancellation list if some time opens up sooner.

## 2019-07-18 ENCOUNTER — Ambulatory Visit (HOSPITAL_BASED_OUTPATIENT_CLINIC_OR_DEPARTMENT_OTHER): Payer: Federal, State, Local not specified - PPO

## 2019-07-18 ENCOUNTER — Other Ambulatory Visit: Payer: Self-pay

## 2019-07-18 ENCOUNTER — Ambulatory Visit (HOSPITAL_BASED_OUTPATIENT_CLINIC_OR_DEPARTMENT_OTHER)
Admission: RE | Admit: 2019-07-18 | Discharge: 2019-07-18 | Disposition: A | Discharge status: Home / Self Care | Payer: Federal, State, Local not specified - PPO | Source: Ambulatory Visit | Attending: Family Medicine | Admitting: Family Medicine

## 2019-07-18 DIAGNOSIS — Z1231 Encounter for screening mammogram for malignant neoplasm of breast: Secondary | ICD-10-CM | POA: Diagnosis not present

## 2019-07-18 DIAGNOSIS — Z1239 Encounter for other screening for malignant neoplasm of breast: Secondary | ICD-10-CM

## 2019-07-28 ENCOUNTER — Other Ambulatory Visit: Payer: Self-pay | Admitting: Family Medicine

## 2019-07-29 NOTE — Telephone Encounter (Signed)
Requesting: Tramadol Contract: 11/07/2106 UDS: 02/13/2019, low risk Last OV: 07/15/2019 Next OV: N/A Last Refill: 05/09/2019, #16--0 RF Database:   Please advise

## 2019-07-31 ENCOUNTER — Ambulatory Visit: Payer: Self-pay | Admitting: *Deleted

## 2019-07-31 ENCOUNTER — Emergency Department (HOSPITAL_COMMUNITY): Payer: Federal, State, Local not specified - PPO

## 2019-07-31 ENCOUNTER — Encounter (HOSPITAL_COMMUNITY): Payer: Self-pay

## 2019-07-31 ENCOUNTER — Other Ambulatory Visit: Payer: Self-pay

## 2019-07-31 ENCOUNTER — Emergency Department (HOSPITAL_COMMUNITY)
Admission: EM | Admit: 2019-07-31 | Discharge: 2019-07-31 | Disposition: A | Discharge status: Home / Self Care | Payer: Federal, State, Local not specified - PPO | Attending: Emergency Medicine | Admitting: Emergency Medicine

## 2019-07-31 DIAGNOSIS — R0602 Shortness of breath: Secondary | ICD-10-CM | POA: Diagnosis not present

## 2019-07-31 DIAGNOSIS — R0789 Other chest pain: Secondary | ICD-10-CM | POA: Diagnosis not present

## 2019-07-31 DIAGNOSIS — R079 Chest pain, unspecified: Secondary | ICD-10-CM | POA: Diagnosis not present

## 2019-07-31 DIAGNOSIS — Z79899 Other long term (current) drug therapy: Secondary | ICD-10-CM | POA: Insufficient documentation

## 2019-07-31 DIAGNOSIS — R42 Dizziness and giddiness: Secondary | ICD-10-CM | POA: Diagnosis not present

## 2019-07-31 DIAGNOSIS — R29810 Facial weakness: Secondary | ICD-10-CM | POA: Diagnosis not present

## 2019-07-31 DIAGNOSIS — M62838 Other muscle spasm: Secondary | ICD-10-CM

## 2019-07-31 DIAGNOSIS — R51 Headache: Secondary | ICD-10-CM | POA: Diagnosis not present

## 2019-07-31 DIAGNOSIS — E119 Type 2 diabetes mellitus without complications: Secondary | ICD-10-CM | POA: Diagnosis not present

## 2019-07-31 DIAGNOSIS — R519 Headache, unspecified: Secondary | ICD-10-CM

## 2019-07-31 DIAGNOSIS — E079 Disorder of thyroid, unspecified: Secondary | ICD-10-CM | POA: Diagnosis not present

## 2019-07-31 DIAGNOSIS — I10 Essential (primary) hypertension: Secondary | ICD-10-CM | POA: Insufficient documentation

## 2019-07-31 DIAGNOSIS — Z7982 Long term (current) use of aspirin: Secondary | ICD-10-CM | POA: Insufficient documentation

## 2019-07-31 DIAGNOSIS — Z7984 Long term (current) use of oral hypoglycemic drugs: Secondary | ICD-10-CM | POA: Insufficient documentation

## 2019-07-31 DIAGNOSIS — Z209 Contact with and (suspected) exposure to unspecified communicable disease: Secondary | ICD-10-CM | POA: Diagnosis not present

## 2019-07-31 DIAGNOSIS — R072 Precordial pain: Secondary | ICD-10-CM | POA: Diagnosis not present

## 2019-07-31 DIAGNOSIS — I491 Atrial premature depolarization: Secondary | ICD-10-CM | POA: Diagnosis not present

## 2019-07-31 LAB — BASIC METABOLIC PANEL
Anion gap: 8 (ref 5–15)
BUN: 15 mg/dL (ref 6–20)
CO2: 27 mmol/L (ref 22–32)
Calcium: 9.1 mg/dL (ref 8.9–10.3)
Chloride: 105 mmol/L (ref 98–111)
Creatinine, Ser: 0.85 mg/dL (ref 0.44–1.00)
GFR calc Af Amer: >60 mL/min (ref >60)
GFR calc non Af Amer: >60 mL/min (ref >60)
Glucose, Bld: 118 mg/dL — ABNORMAL HIGH (ref 70–99)
Potassium: 3.5 mmol/L (ref 3.5–5.1)
Sodium: 140 mmol/L (ref 135–145)

## 2019-07-31 LAB — CBC
HCT: 44.5 % (ref 36.0–46.0)
Hemoglobin: 13.9 g/dL (ref 12.0–15.0)
MCH: 27.6 pg (ref 26.0–34.0)
MCHC: 31.2 g/dL (ref 30.0–36.0)
MCV: 88.5 fL (ref 80.0–100.0)
Platelets: 198 10*3/uL (ref 150–400)
RBC: 5.03 MIL/uL (ref 3.87–5.11)
RDW: 13.2 % (ref 11.5–15.5)
WBC: 6.7 10*3/uL (ref 4.0–10.5)
nRBC: 0 % (ref 0.0–0.2)

## 2019-07-31 LAB — TROPONIN I (HIGH SENSITIVITY)
Troponin I (High Sensitivity): 7 ng/L (ref <18)
Troponin I (High Sensitivity): 8 ng/L (ref <18)

## 2019-07-31 NOTE — ED Triage Notes (Signed)
Per EMS- Patient states that she woke with dizziness, neck pain, and SOB. Patient states dizziness and SOB have resolved Patient continues to c/o neck pain.. patient ambulatory to the EMS truck, lungs clear, States she feels better now.

## 2019-07-31 NOTE — Telephone Encounter (Signed)
Pt called in using sign language interpreter service.   She woke up this morning with severe dizziness, a pain in the back of her head and leaning to the side when she walks.  She is having a hard time focusing due to the dizziness.  I instructed her to call 911 now to go to the hospital ED.  I let her know due to her symptoms the ambulance would probably take her to [facility].  She was agreeable to this plan and will call 911 now.  I sent these notes to Dr. Etter Sjogren Chase's office so she would be aware of the ED referral.    Reason for Disposition . SEVERE dizziness (e.g., unable to stand, requires support to walk, feels like passing out now)    She is very dizzy, having pain in the back of her head and leaning to the side when she walks.  Answer Assessment - Initial Assessment Questions 1. DESCRIPTION: "Describe your dizziness."     I'm really dizzy and I'm hurting in the back of my head and when I walk I'm leaning to the side.   I had to call in sick.  It started this morning when I woke up. 2. LIGHTHEADED: "Do you feel lightheaded?" (e.g., somewhat faint, woozy, weak upon standing)     Not going to faint.     3. VERTIGO: "Do you feel like either you or the room is spinning or tilting?" (i.e. vertigo)     No.   I'm more concerned about leaning to the side when I walk.   I feel really weak.   Something's not right.   4. SEVERITY: "How bad is it?"  "Do you feel like you are going to faint?" "Can you stand and walk?"   - MILD - walking normally   - MODERATE - interferes with normal activities (e.g., work, school)    - SEVERE - unable to stand, requires support to walk, feels like passing out now.      Severe.   No stroke history. 5. ONSET:  "When did the dizziness begin?"     This morning when I woke up I was dizzy. 6. AGGRAVATING FACTORS: "Does anything make it worse?" (e.g., standing, change in head position)     Walking 7. HEART RATE: "Can you tell me your heart rate?" "How many beats  in 15 seconds?"  (Note: not all patients can do this)       Not asked 8. CAUSE: "What do you think is causing the dizziness?"     I don't know 9. RECURRENT SYMPTOM: "Have you had dizziness before?" If so, ask: "When was the last time?" "What happened that time?"     I've had dizziness before but nothing like this. 10. OTHER SYMPTOMS: "Do you have any other symptoms?" (e.g., fever, chest pain, vomiting, diarrhea, bleeding)       The back of my neck is really sore.   Just the back of my head is hurting.    My vision is blurry and I'm so dizzy. 11. PREGNANCY: "Is there any chance you are pregnant?" "When was your last menstrual period?"       Not ended  Protocols used: DIZZINESS [facility]

## 2019-07-31 NOTE — Telephone Encounter (Signed)
noted 

## 2019-07-31 NOTE — ED Provider Notes (Signed)
[facility] DEPT Provider Note   CSN: [id number] Arrival date & time: 07/31/19  1051     History   Chief Complaint Chief Complaint  Patient presents with  . Neck Pain    HPI Rhonda Morrow is a 58 y.o. female.     58yo female brought in by EMS for pain in the back of the head, onset today with dizziness upon waking, tried to walk and was swaying from one side to another. Felt like her heart was racing so called PCP who advised to go to the ER. Dizziness described as feeling light headed, has improved since onset. Pain in the back of the head on the left side. Still has a slight pain in the area, pain started yesterday around 6PM, pain has gradually gotten worse since onset. No history of similar head pain previously. Pain does not radiate, no pain in the neck or arms (reports chronic pain in the right arm, not new). Reports slight chest discomfort onset yesterday, trouble breathing, used inhaler but began to feel more like pressure throughout the night, feels like a pain now. CP located mid sternal, does not radiate, pain is worse with lifting. No visual changes.   A language interpreter was used (ASL).    Past Medical History:  Diagnosis Date  . Arthritis   . Asthma   . Complication of anesthesia    one time woke up and was vey scared,17 yrs ago  . Deaf   . Diabetes mellitus without complication ([place])   . GERD (gastroesophageal reflux disease)   . Hypertension   . Left groin pain   . Thyroid disease     Patient Active Problem List   Diagnosis Date Noted  . BMI 40.0-44.9, adult ([place]) 01/22/2019  . RLQ abdominal pain 10/20/2018  . Rectal pain 10/20/2018  . Vitamin B 12 deficiency 03/28/2018  . B12 deficiency 03/28/2018  . DM (diabetes mellitus) type II uncontrolled, periph vascular disorder ([place]) 03/28/2018  . Hyperlipidemia 03/28/2018  . Primary osteoarthritis of left hip 02/21/2017  . 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  History of laparoscopic cholecystectomy 06/20/2016  . Cerumen impaction 06/09/2016  . Hypersomnia 05/16/2016  . Knee pain, right 05/05/2016  . RUQ pain 04/09/2016  . Abnormal CT scan 03/28/2016  . Dyspnea and respiratory abnormalities 03/15/2016  . Asthma with acute exacerbation 03/15/2016  . Asthma in adult 02/25/2016  . DM (diabetes mellitus) type II controlled, neurological manifestation ([place]) 02/09/2016  . Left hip pain 12/23/2015  . Right hamstring muscle strain 11/18/2015  . Abdominal pain, acute 09/01/2015  . Acute asthma exacerbation 04/16/2015  . Acute bronchitis 04/14/2015  . Vaginal discharge 09/14/2014  . Pap smear for cervical cancer screening 09/14/2014  . Scabies 05/06/2014  . Bed bug bite 05/06/2014  . Allergic rhinitis 04/09/2014  . Rectal itching 04/09/2014  . Bladder spasm 04/09/2014  . Knee pain, left 03/05/2014  . Hypokalemia 02/19/2014  . Nausea with vomiting 02/19/2014  . Diabetes mellitus, type II ([place]) 02/19/2014  . Edema 02/16/2014  . Disorder of rotator cuff 11/12/2013  . Routine general medical examination at a health care facility 08/20/2013  . GERD (gastroesophageal reflux disease) 06/26/2013  . Dysphagia, pharyngoesophageal phase 06/26/2013  . Suprapubic pain 05/12/2013  . Medication side effect 03/25/2013  . Diarrhea 03/25/2013  . Benign positional vertigo 01/30/2013  . Traumatic hematoma of thigh 01/15/2013  . HTN (hypertension) 09/02/2012  . Dry skin 08/22/2012  . External hemorrhoids 08/22/2012  .  Obesity 06/30/2012  . Thyromegaly 05/22/2012  . Back pain 05/22/2012  . Elevated glucose 05/22/2012  . Moderate persistent asthma 04/19/2012  . Dyspnea 01/28/2012    Past Surgical History:  Procedure Laterality Date  . CARDIAC CATHETERIZATION    . CESAREAN SECTION    . CHOLECYSTECTOMY N/A 06/20/2016   Procedure: LAPAROSCOPIC CHOLECYSTECTOMY;  Surgeon: Rolm Bookbinder, MD;  Location: [facility];  Service: General;  Laterality: N/A;  .  ESOPHAGEAL MANOMETRY N/A 09/29/2013   Procedure: ESOPHAGEAL MANOMETRY (EM);  Surgeon: Milus Banister, MD;  Location: [facility];  Service: Endoscopy;  Laterality: N/A;  . TOTAL HIP ARTHROPLASTY Left 02/21/2017   Procedure: LEFT TOTAL HIP ARTHROPLASTY ANTERIOR APPROACH;  Surgeon: Leandrew Koyanagi, MD;  Location: [facility];  Service: Orthopedics;  Laterality: Left;     OB History    Gravida  3   Para      Term      Preterm      AB      Living  3     SAB      TAB      Ectopic      Multiple      Live Births               Home Medications    Prior to Admission medications   Medication Sig Start Date End Date Taking? Authorizing Provider  albuterol (PROVENTIL HFA;VENTOLIN HFA) 108 (90 Base) MCG/ACT inhaler Inhale 2 puffs into the lungs every 6 (six) hours as needed for wheezing. 06/25/17   Parrett, Fonnie Mu, NP  allopurinol (ZYLOPRIM) 100 MG tablet TAKE 1 TABLET BY MOUTH EVERY DAY Patient taking differently: Take 100 mg by mouth daily.  08/19/18   [name] R, DO  amLODipine (NORVASC) 5 MG tablet TAKE 1 TABLET BY MOUTH EVERY DAY 05/20/19   Carollee Herter, Alferd Apa, DO  aspirin EC 325 MG tablet Take 1 tablet (325 mg total) by mouth 2 (two) times daily. 02/21/17   Leandrew Koyanagi, MD  azelastine (ASTELIN) 0.1 % nasal spray Place 2 sprays into both nostrils 2 (two) times daily. Use in each nostril as directed 09/01/16   Carollee Herter, Alferd Apa, DO  budesonide-formoterol (SYMBICORT) 160-4.5 MCG/ACT inhaler Inhale 2 puffs into the lungs every 12 (twelve) hours. 11/26/18   Icard, Octavio Graves, DO  cetirizine (ZYRTEC) 10 MG tablet Take 1 tablet (10 mg total) by mouth daily. No Therapeutic Substitution 04/30/19   Carollee Herter, Alferd Apa, DO  cholestyramine Lucrezia Starch) 4 GM/DOSE powder Take 1 packet (4 g total) by mouth daily for 30 days. 12/18/18 01/17/19  Milus Banister, MD  colchicine 0.6 MG tablet Take 1 tablet (0.6 mg total) by mouth daily. 05/30/19   Ann Held, DO  Cyanocobalamin  (B-12) 1000 MCG SUBL Dissolove 1 tablet under tongue once a day 04/01/19   Carollee Herter, Alferd Apa, DO  cyclobenzaprine (FLEXERIL) 5 MG tablet Take 1 tablet (5 mg total) by mouth at bedtime. 05/23/19   Ann Held, DO  diazepam (VALIUM) 5 MG tablet 1 PO 1 hour before MRI, repeat prn 06/03/19   Hilts, Legrand Como, MD  diclofenac (VOLTAREN) 75 MG EC tablet Take 1 tablet (75 mg total) by mouth 2 (two) times daily as needed. 06/03/19   Hilts, Legrand Como, MD  diclofenac sodium (VOLTAREN) 1 % GEL Apply 4 g topically 4 (four) times daily as needed. 11/19/18   Hilts, Legrand Como, MD  Fiber CHEW Chew 1 tablet by mouth daily.  [provider]  fluticasone (FLONASE) 50 MCG/ACT nasal spray PLACE 2 SPRAYS INTO BOTH NOSTRILS DAILY. 11/21/18   [name] R, DO  glucose blood test strip Use as instructed--- one touch verio test strips 10/16/16   Carollee Herter, Yvonne R, DO  hydrochlorothiazide (HYDRODIURIL) 12.5 MG tablet TAKE 1 TABLET BY MOUTH EVERY DAY 05/08/19   Carollee Herter, Kendrick Fries R, DO  hydrOXYzine (ATARAX/VISTARIL) 10 MG tablet TAKE 1 TO 2 TABLETS BY MOUTH AT BEDTIME AS NEEDED FOR ITCHING 05/22/19   Lowne Chase, Yvonne R, DO  ipratropium-albuterol (DUONEB) 0.5-2.5 (3) MG/3ML SOLN Take 3 mLs by nebulization every 6 (six) hours as needed. 11/21/18   Ann Held, DO  KLOR-CON M20 20 MEQ tablet TAKE 1 TABLET BY MOUTH EVERY DAY 12/05/18   Carollee Herter, Alferd Apa, DO  Lancets [facility] ULTRASOFT) lancets Use as instructed 12/19/17   Saguier, Percell Miller, PA-C  meclizine (ANTIVERT) 12.5 MG tablet Take 1 tablet (12.5 mg total) by mouth 3 (three) times daily as needed for dizziness. 12/19/17   Saguier, Percell Miller, PA-C  metaxalone (SKELAXIN) 800 MG tablet TAKE 1 TABLET BY MOUTH THREE TIMES A DAY Patient taking differently: Take 800 mg by mouth 3 (three) times daily.  10/03/17   [name] R, DO  metFORMIN (GLUCOPHAGE-XR) 500 MG 24 hr tablet TAKE 1 TABLET BY MOUTH EVERY DAY WITH BREAKFAST 07/15/19   Carollee Herter,  Yvonne R, DO  mometasone-formoterol (DULERA) 100-5 MCG/ACT AERO Inhale 2 puffs into the lungs 2 (two) times daily. 11/21/18   [name] R, DO  montelukast (SINGULAIR) 10 MG tablet Take 1 tablet (10 mg total) by mouth at bedtime. 11/21/18   Ann Held, DO  Multiple Vitamin (MULTIVITAMIN) tablet Take 1 tablet by mouth daily.    [provider]  NONFORMULARY OR COMPOUNDED ITEM Nebulizer   DX ASTHMA 02/25/16   Carollee Herter, Alferd Apa, DO  nystatin-triamcinolone [facility] II) cream Apply twice daily to lower chest area/upper abdomen 05/12/19   Saguier, Percell Miller, PA-C  ondansetron (ZOFRAN ODT) 4 MG disintegrating tablet Take 1 tablet (4 mg total) by mouth every 8 (eight) hours as needed for nausea or vomiting. 11/17/18   Carlisle Cater, PA-C  pantoprazole (PROTONIX) 40 MG tablet Take 1 tablet (40 mg total) by mouth daily. 01/06/19   Ann Held, DO  ranitidine (ZANTAC) 150 MG tablet TAKE 1 TABLET BY MOUTH TWICE A DAY Patient taking differently: Take 150 mg by mouth 2 (two) times daily.  12/31/17   Ann Held, DO  Spacer/Aero-Holding Chambers (AEROCHAMBER MV) inhaler Use as instructed 11/26/18   Icard, Octavio Graves, DO  traMADol (ULTRAM) 50 MG tablet TAKE 1 TABLET (50 MG TOTAL) BY MOUTH EVERY 8 (EIGHT) HOURS AS NEEDED FOR UP TO 5 DAYS. 07/29/19   Carollee Herter, Alferd Apa, DO  triamcinolone cream (KENALOG) 0.1 % Apply bid to hands 05/12/19   Saguier, Percell Miller, PA-C  zolpidem (AMBIEN) 10 MG tablet Take 1 tablet (10 mg total) by mouth at bedtime as needed. for sleep 05/28/19   Ann Held, DO    Family History Family History  Problem Relation Age of Onset  . Diabetes Mother   . Heart disease Mother   . Diabetes Father     Social History Social History   Tobacco Use  . Smoking status: Never Smoker  . Smokeless tobacco: Never Used  Substance Use Topics  . Alcohol use: No    Alcohol/week: 0.0 standard drinks  .  Drug use: No     Allergies   Losartan,  Oxycodone-acetaminophen, and Augmentin [amoxicillin-pot clavulanate]   Review of Systems Review of Systems  Constitutional: Negative for chills, diaphoresis and fever.  Eyes: Negative for visual disturbance.  Respiratory: Positive for shortness of breath.   Cardiovascular: Positive for chest pain and palpitations. Negative for leg swelling.  Gastrointestinal: Negative for abdominal pain, constipation, diarrhea, nausea and vomiting.  Musculoskeletal: Positive for gait problem and neck pain.  Skin: Negative for rash and wound.  Allergic/Immunologic: Positive for immunocompromised state.  Neurological: Positive for dizziness and light-headedness. Negative for weakness.  All other systems reviewed and are negative.    Physical Exam Updated Vital Signs BP (!) 153/73   Pulse 84   Temp 98.9 F (37.2 C) (Oral)   Resp 15   Ht 5\' 2"  (1.575 m)   Wt 109 kg   LMP 05/20/2012   SpO2 100%   BMI 43.95 kg/m   Physical Exam Vitals signs and nursing note reviewed.  Constitutional:      General: She is not in acute distress.    Appearance: She is well-developed. She is not diaphoretic.  HENT:     Head: Normocephalic and atraumatic.     Mouth/Throat:     Mouth: Mucous membranes are moist.     Pharynx: Oropharynx is clear.  Eyes:     Extraocular Movements: Extraocular movements intact.     Conjunctiva/sclera: Conjunctivae normal.     Pupils: Pupils are equal, round, and reactive to light.  Neck:     Musculoskeletal: Normal range of motion and neck supple. Normal range of motion. Muscular tenderness present. No injury or spinous process tenderness.   Cardiovascular:     Rate and Rhythm: Normal rate and regular rhythm.     Pulses: Normal pulses.     Heart sounds: Normal heart sounds.  Pulmonary:     Effort: Pulmonary effort is normal.     Breath sounds: Normal breath sounds.  Abdominal:     Palpations: Abdomen is soft.     Tenderness: There is no abdominal tenderness.   Musculoskeletal:        General: No swelling, tenderness, deformity or signs of injury.     Right lower leg: No edema.     Left lower leg: No edema.  Skin:    General: Skin is warm and dry.     Findings: No erythema or rash.  Neurological:     General: No focal deficit present.     Mental Status: She is alert and oriented to person, place, and time.     GCS: GCS eye subscore is 4. GCS verbal subscore is 5. GCS motor subscore is 6.     Cranial Nerves: Cranial nerves are intact. No cranial nerve deficit or facial asymmetry.     Sensory: Sensation is intact. No sensory deficit.     Motor: Motor function is intact. No weakness.  Psychiatric:        Behavior: Behavior normal.      ED Treatments / Results  Labs (all labs ordered are listed, but only abnormal results are displayed) Labs Reviewed  BASIC METABOLIC PANEL - Abnormal; Notable for the following components:      Result Value   Glucose, Bld 118 (*)    All other components within normal limits  CBC  TROPONIN I (HIGH SENSITIVITY)  TROPONIN I (HIGH SENSITIVITY)    EKG EKG Interpretation  Date/Time:  Thursday July 31 2019 13:04:50 EDT Ventricular Rate:  81 PR Interval:    QRS Duration: 103 QT Interval:  354 QTC Calculation: 411 R Axis:   -40 Text Interpretation:  Sinus rhythm Multiple ventricular premature complexes Left axis deviation Low voltage, precordial leads Borderline T abnormalities, anterior leads Confirmed by Virgel Manifold [phone]) on 07/31/2019 2:27:21 PM   Radiology Ct Head Wo Contrast  Result Date: 07/31/2019 CLINICAL DATA:  Vertigo, episodic with dizziness EXAM: CT HEAD WITHOUT CONTRAST TECHNIQUE: Contiguous axial images were obtained from the base of the skull through the vertex without intravenous contrast. COMPARISON:  10/10/2016 FINDINGS: Brain: No evidence of acute infarction, hemorrhage, hydrocephalus, extra-axial collection or mass lesion/mass effect. Vascular: No hyperdense vessel or  unexpected calcification. Skull: Signs of hyperostosis frontalis since the prior study. Scattered dural calcifications are unchanged. Opacification of left mastoid air cells, Ace stable finding. Right mastoid air cells are clear. Sinuses/Orbits: Visualized paranasal sinuses are clear. Other: None. IMPRESSION: No acute intracranial abnormality. Chronic left mastoid effusion. Electronically Signed   By: Zetta Bills M.D.   On: 07/31/2019 13:07   Dg Chest Port 1 View  Result Date: 07/31/2019 CLINICAL DATA:  Chest pain EXAM: PORTABLE CHEST 1 VIEW COMPARISON:  November 17, 2016 FINDINGS: There is no edema or consolidation. There is cardiomegaly with pulmonary vascularity normal. No adenopathy. No bone lesions. No pneumothorax. IMPRESSION: Cardiomegaly.  No edema or consolidation. Electronically Signed   By: Lowella Grip III M.D.   On: 07/31/2019 12:13    Procedures Procedures (including critical care time)  Medications Ordered in ED Medications - No data to display   Initial Impression / Assessment and Plan / ED Course  I have reviewed the triage vital signs and the nursing notes.  Pertinent labs & imaging results that were available during my care of the patient were reviewed by me and considered in my medical decision making (see chart for details).  Clinical Course as of Jul 30 1644  Thu Jul 31, 2755  2418 58 year old female presents with complaint of feeling dizzy/lightheaded earlier today and unsteady on her feet as well as having midsternal chest discomfort.  Symptoms had improved upon arrival at the hospital today.  On exam patient is well-appearing, neuro exam is unremarkable, heart and lung exam unremarkable. Tenderness noted to left and right trapezius areas.  Lab work is reassuring including CBC, BMP, troponin x2.  EKG without acute ischemic changes.  Vital signs stable. CT of the head shows no acute intracranial abnormality, chronic left mastoid effusion.  Discussed with patient  using the same leg which interpreter, patient will follow up with her PCP regarding today's visit and this finding.  Patient to return to emergency room for any new or worsening symptoms.   [LM]    Clinical Course User Index [LM] Tacy Learn, PA-C      Final Clinical Impressions(s) / ED Diagnoses   Final diagnoses:  Trapezius muscle spasm  Nonintractable episodic headache, unspecified headache type  Light headed  Chest pain, unspecified type    ED Discharge Orders    None       Tacy Learn, PA-C 07/31/19 1645    Virgel Manifold, MD 08/02/19 [phone]

## 2019-07-31 NOTE — Discharge Instructions (Addendum)
Follow up with your doctor for your visit today as well as incidental finding or chronic left mastoid effusion on your head CT.  Return to the ER for new or worsening symptoms.  Apply warm compresses to your shoulders for 20 minutes thee times daily. Do shoulder roll exercises to help with muscle tension in your shoulders and neck.

## 2019-07-31 NOTE — ED Notes (Signed)
Pt ambulatory to bathroom

## 2019-07-31 NOTE — ED Notes (Signed)
Patient transported to CT 

## 2019-07-31 NOTE — Telephone Encounter (Signed)
FYI

## 2019-08-11 ENCOUNTER — Other Ambulatory Visit: Payer: Self-pay

## 2019-08-11 ENCOUNTER — Encounter: Payer: Self-pay | Admitting: Family Medicine

## 2019-08-11 ENCOUNTER — Ambulatory Visit (INDEPENDENT_AMBULATORY_CARE_PROVIDER_SITE_OTHER): Payer: Federal, State, Local not specified - PPO | Admitting: Family Medicine

## 2019-08-11 VITALS — BP 128/78 | HR 82 | Temp 97.8°F | Resp 18 | Ht 62.0 in | Wt 240.4 lb

## 2019-08-11 DIAGNOSIS — J0100 Acute maxillary sinusitis, unspecified: Secondary | ICD-10-CM | POA: Insufficient documentation

## 2019-08-11 DIAGNOSIS — G8929 Other chronic pain: Secondary | ICD-10-CM | POA: Diagnosis not present

## 2019-08-11 DIAGNOSIS — M25511 Pain in right shoulder: Secondary | ICD-10-CM

## 2019-08-11 DIAGNOSIS — M62838 Other muscle spasm: Secondary | ICD-10-CM

## 2019-08-11 MED ORDER — CEFDINIR 300 MG PO CAPS: 300.0000 mg | ORAL_CAPSULE | Freq: Two times a day (BID) | ORAL | 0 refills | Status: DC

## 2019-08-11 MED ORDER — ALPRAZOLAM 0.5 MG PO TABS: ORAL_TABLET | ORAL | 0 refills | Status: DC

## 2019-08-11 MED ORDER — FLUTICASONE PROPIONATE 50 MCG/ACT NA SUSP: 2.0000 | Freq: Every day | NASAL | 6 refills | Status: DC

## 2019-08-11 MED ORDER — CYCLOBENZAPRINE HCL 10 MG PO TABS: 10.0000 mg | ORAL_TABLET | Freq: Three times a day (TID) | ORAL | 0 refills | Status: DC | PRN

## 2019-08-11 NOTE — Progress Notes (Signed)
Patient ID: Rhonda Morrow, female    DOB: 12/30/1960  Age: 58 y.o. MRN: [id number]    Subjective:  Subjective  HPI [name] presents for f/u from er for R shoulder and trapezius pain ---and sinus pressure.  Ct scan showed opacity L mastoid sinus and she has max sinus tenderness  She has seen ortho for her shoulder but has had trouble finding an mri she fits in .  She needs an open mri .    Review of Systems  Constitutional: Negative for appetite change, diaphoresis, fatigue and unexpected weight change.  HENT: Positive for sinus pressure and sinus pain.   Eyes: Negative for pain, redness and visual disturbance.  Respiratory: Negative for cough, chest tightness, shortness of breath and wheezing.   Cardiovascular: Negative for chest pain, palpitations and leg swelling.  Endocrine: Negative for cold intolerance, heat intolerance, polydipsia, polyphagia and polyuria.  Genitourinary: Negative for difficulty urinating, dysuria and frequency.  Musculoskeletal: Positive for back pain.  Neurological: Negative for dizziness, light-headedness, numbness and headaches.    History Past Medical History:  Diagnosis Date   Arthritis    Asthma    Complication of anesthesia    one time woke up and was vey scared,17 yrs ago   Deaf    Diabetes mellitus without complication ([place])    GERD (gastroesophageal reflux disease)    Hypertension    Left groin pain    Thyroid disease     She has a past surgical history that includes Cesarean section; Cardiac catheterization; Esophageal manometry (N/A, 09/29/2013); Cholecystectomy (N/A, 06/20/2016); and Total hip arthroplasty (Left, 02/21/2017).   Her family history includes Diabetes in her father and mother; Heart disease in her mother.She reports that she has never smoked. She has never used smokeless tobacco. She reports that she does not drink alcohol or use drugs.  Current Outpatient Medications on File Prior to Visit  Medication Sig Dispense  Refill   albuterol (PROVENTIL HFA;VENTOLIN HFA) 108 (90 Base) MCG/ACT inhaler Inhale 2 puffs into the lungs every 6 (six) hours as needed for wheezing. 1 Inhaler 5   allopurinol (ZYLOPRIM) 100 MG tablet TAKE 1 TABLET BY MOUTH EVERY DAY (Patient taking differently: Take 100 mg by mouth daily. ) 90 tablet 2   amLODipine (NORVASC) 5 MG tablet TAKE 1 TABLET BY MOUTH EVERY DAY 90 tablet 1   aspirin EC 325 MG tablet Take 1 tablet (325 mg total) by mouth 2 (two) times daily. 84 tablet 0   azelastine (ASTELIN) 0.1 % nasal spray Place 2 sprays into both nostrils 2 (two) times daily. Use in each nostril as directed 30 mL 3   budesonide-formoterol (SYMBICORT) 160-4.5 MCG/ACT inhaler Inhale 2 puffs into the lungs every 12 (twelve) hours. 1 Inhaler 0   cetirizine (ZYRTEC) 10 MG tablet Take 1 tablet (10 mg total) by mouth daily. No Therapeutic Substitution 90 tablet 1   colchicine 0.6 MG tablet Take 1 tablet (0.6 mg total) by mouth daily. 90 tablet 1   Cyanocobalamin (B-12) 1000 MCG SUBL Dissolove 1 tablet under tongue once a day 90 each 1   cyclobenzaprine (FLEXERIL) 5 MG tablet Take 1 tablet (5 mg total) by mouth at bedtime. 10 tablet 1   diclofenac (VOLTAREN) 75 MG EC tablet Take 1 tablet (75 mg total) by mouth 2 (two) times daily as needed. 60 tablet 3   diclofenac sodium (VOLTAREN) 1 % GEL Apply 4 g topically 4 (four) times daily as needed. 500 g 6   Fiber CHEW  Chew 1 tablet by mouth daily.     fluticasone (FLONASE) 50 MCG/ACT nasal spray PLACE 2 SPRAYS INTO BOTH NOSTRILS DAILY. 16 g 4   glucose blood test strip Use as instructed--- one touch verio test strips 100 each 12   hydrochlorothiazide (HYDRODIURIL) 12.5 MG tablet TAKE 1 TABLET BY MOUTH EVERY DAY 90 tablet 1   hydrOXYzine (ATARAX/VISTARIL) 10 MG tablet TAKE 1 TO 2 TABLETS BY MOUTH AT BEDTIME AS NEEDED FOR ITCHING 180 tablet 1   ipratropium-albuterol (DUONEB) 0.5-2.5 (3) MG/3ML SOLN Take 3 mLs by nebulization every 6 (six) hours as  needed. 360 mL 3   KLOR-CON M20 20 MEQ tablet TAKE 1 TABLET BY MOUTH EVERY DAY 90 tablet 1   Lancets (ONETOUCH ULTRASOFT) lancets Use as instructed 100 each 12   meclizine (ANTIVERT) 12.5 MG tablet Take 1 tablet (12.5 mg total) by mouth 3 (three) times daily as needed for dizziness. 30 tablet 0   metaxalone (SKELAXIN) 800 MG tablet TAKE 1 TABLET BY MOUTH THREE TIMES A DAY (Patient taking differently: Take 800 mg by mouth 3 (three) times daily. ) 30 tablet 2   metFORMIN (GLUCOPHAGE-XR) 500 MG 24 hr tablet TAKE 1 TABLET BY MOUTH EVERY DAY WITH BREAKFAST 90 tablet 1   mometasone-formoterol (DULERA) 100-5 MCG/ACT AERO Inhale 2 puffs into the lungs 2 (two) times daily. 1 Inhaler PRN   montelukast (SINGULAIR) 10 MG tablet Take 1 tablet (10 mg total) by mouth at bedtime. 90 tablet 3   Multiple Vitamin (MULTIVITAMIN) tablet Take 1 tablet by mouth daily.     NONFORMULARY OR COMPOUNDED ITEM Nebulizer   DX ASTHMA 1 each 0   nystatin-triamcinolone (MYCOLOG II) cream Apply twice daily to lower chest area/upper abdomen 30 g 0   ondansetron (ZOFRAN ODT) 4 MG disintegrating tablet Take 1 tablet (4 mg total) by mouth every 8 (eight) hours as needed for nausea or vomiting. 10 tablet 0   pantoprazole (PROTONIX) 40 MG tablet Take 1 tablet (40 mg total) by mouth daily. 30 tablet 3   ranitidine (ZANTAC) 150 MG tablet TAKE 1 TABLET BY MOUTH TWICE A DAY (Patient taking differently: Take 150 mg by mouth 2 (two) times daily. ) 180 tablet 1   Spacer/Aero-Holding Chambers (AEROCHAMBER MV) inhaler Use as instructed 1 each 0   traMADol (ULTRAM) 50 MG tablet TAKE 1 TABLET (50 MG TOTAL) BY MOUTH EVERY 8 (EIGHT) HOURS AS NEEDED FOR UP TO 5 DAYS. 16 tablet 0   triamcinolone cream (KENALOG) 0.1 % Apply bid to hands 30 g 0   zolpidem (AMBIEN) 10 MG tablet Take 1 tablet (10 mg total) by mouth at bedtime as needed. for sleep 15 tablet 1   cholestyramine (QUESTRAN) 4 GM/DOSE powder Take 1 packet (4 g total) by mouth  daily for 30 days. 30 packet 3   No current facility-administered medications on file prior to visit.      Objective:  Objective  Physical Exam Vitals signs and nursing note reviewed.  Constitutional:      Appearance: She is well-developed.  HENT:     Head: Normocephalic and atraumatic.  Eyes:     Conjunctiva/sclera: Conjunctivae normal.  Neck:     Musculoskeletal: Normal range of motion and neck supple.     Thyroid: No thyromegaly.     Vascular: No carotid bruit or JVD.  Cardiovascular:     Rate and Rhythm: Normal rate and regular rhythm.     Heart sounds: Normal heart sounds. No murmur.  Pulmonary:  Effort: Pulmonary effort is normal. No respiratory distress.     Breath sounds: Normal breath sounds. No wheezing or rales.  Chest:     Chest wall: No tenderness.  Musculoskeletal:        General: Tenderness present.     Right shoulder: She exhibits decreased range of motion, tenderness, pain and spasm.     Cervical back: She exhibits pain and spasm.       Back:  Neurological:     Mental Status: She is alert and oriented to person, place, and time.    BP 128/78 (BP Location: Right Arm, Patient Position: Sitting, Cuff Size: Large)    Pulse 82    Temp 97.8 F (36.6 C) (Temporal)    Resp 18    Ht 5\' 2"  (1.575 m)    Wt 240 lb 6.4 oz (109 kg)    LMP 05/20/2012    SpO2 96%    BMI 43.97 kg/m  Wt Readings from Last 3 Encounters:  08/11/19 240 lb 6.4 oz (109 kg)  07/31/19 240 lb 4.8 oz (109 kg)  07/15/19 241 lb 9.6 oz (109.6 kg)     Lab Results  Component Value Date   WBC 6.7 07/31/2019   HGB 13.9 07/31/2019   HCT 44.5 07/31/2019   PLT 198 07/31/2019   GLUCOSE 118 (H) 07/31/2019   CHOL 142 05/23/2019   TRIG 79.0 05/23/2019   HDL 54.00 05/23/2019   LDLCALC 72 05/23/2019   ALT 32 05/14/2019   AST 22 05/14/2019   NA 140 07/31/2019   K 3.5 07/31/2019   CL 105 07/31/2019   CREATININE 0.85 07/31/2019   BUN 15 07/31/2019   CO2 27 07/31/2019   TSH 1.36 05/14/2019    INR 1.05 02/12/2017   HGBA1C 6.4 05/23/2019   MICROALBUR 0.8 10/27/2015    Ct Head Wo Contrast  Result Date: 07/31/2019 CLINICAL DATA:  Vertigo, episodic with dizziness EXAM: CT HEAD WITHOUT CONTRAST TECHNIQUE: Contiguous axial images were obtained from the base of the skull through the vertex without intravenous contrast. COMPARISON:  10/10/2016 FINDINGS: Brain: No evidence of acute infarction, hemorrhage, hydrocephalus, extra-axial collection or mass lesion/mass effect. Vascular: No hyperdense vessel or unexpected calcification. Skull: Signs of hyperostosis frontalis since the prior study. Scattered dural calcifications are unchanged. Opacification of left mastoid air cells, Ace stable finding. Right mastoid air cells are clear. Sinuses/Orbits: Visualized paranasal sinuses are clear. Other: None. IMPRESSION: No acute intracranial abnormality. Chronic left mastoid effusion. Electronically Signed   By: Zetta Bills M.D.   On: 07/31/2019 13:07   Dg Chest Port 1 View  Result Date: 07/31/2019 CLINICAL DATA:  Chest pain EXAM: PORTABLE CHEST 1 VIEW COMPARISON:  November 17, 2016 FINDINGS: There is no edema or consolidation. There is cardiomegaly with pulmonary vascularity normal. No adenopathy. No bone lesions. No pneumothorax. IMPRESSION: Cardiomegaly.  No edema or consolidation. Electronically Signed   By: Lowella Grip III M.D.   On: 07/31/2019 12:13     Assessment & Plan:  Plan  I have discontinued Verdis Frederickson A. Chalker's diazepam. I am also having her start on ALPRAZolam, cyclobenzaprine, cefdinir, and fluticasone. Additionally, I am having her maintain her multivitamin, Fiber, NONFORMULARY OR COMPOUNDED ITEM, azelastine, glucose blood, aspirin EC, albuterol, metaxalone, onetouch ultrasoft, meclizine, ranitidine, allopurinol, ondansetron, diclofenac sodium, mometasone-formoterol, montelukast, ipratropium-albuterol, fluticasone, budesonide-formoterol, AeroChamber MV, [place]-Con M20, cholestyramine,  pantoprazole, B-12, cetirizine, hydrochlorothiazide, triamcinolone cream, nystatin-triamcinolone, amLODipine, hydrOXYzine, cyclobenzaprine, zolpidem, colchicine, diclofenac, metFORMIN, and traMADol.  Meds ordered this encounter  Medications   ALPRAZolam (  XANAX) 0.5 MG tablet    Sig: 1 tab po 30 min prior to procedure and prn    Dispense:  10 tablet    Refill:  0   cyclobenzaprine (FLEXERIL) 10 MG tablet    Sig: Take 1 tablet (10 mg total) by mouth 3 (three) times daily as needed for muscle spasms.    Dispense:  30 tablet    Refill:  0   cefdinir (OMNICEF) 300 MG capsule    Sig: Take 1 capsule (300 mg total) by mouth 2 (two) times daily.    Dispense:  20 capsule    Refill:  0   fluticasone (FLONASE) 50 MCG/ACT nasal spray    Sig: Place 2 sprays into both nostrils daily.    Dispense:  16 g    Refill:  6    Problem List Items Addressed This Visit      Unprioritized   Acute non-recurrent maxillary sinusitis - Primary    flonase and abx per orders If no relief of symptoms Refer to ENT      Relevant Medications   cefdinir (OMNICEF) 300 MG capsule   fluticasone (FLONASE) 50 MCG/ACT nasal spray   Chronic right shoulder pain    Pt will f/u ortho Will try to get mri for them--- pain is worsening--- if we have trouble she will try to f/u ortho sooner  Refill muscle relaxer       Relevant Medications   ALPRAZolam (XANAX) 0.5 MG tablet   cyclobenzaprine (FLEXERIL) 10 MG tablet   Other Relevant Orders   MR Shoulder Right Wo Contrast    Other Visit Diagnoses    Muscle spasm       Relevant Medications   cyclobenzaprine (FLEXERIL) 10 MG tablet      Follow-up: Return if symptoms worsen or fail to improve.  Ann Held, DO

## 2019-08-11 NOTE — Patient Instructions (Signed)
Sinusitis, Adult Sinusitis is inflammation of your sinuses. Sinuses are hollow spaces in the bones around your face. Your sinuses are located:  Around your eyes.  In the middle of your forehead.  Behind your nose.  In your cheekbones. Mucus normally drains out of your sinuses. When your nasal tissues become inflamed or swollen, mucus can become trapped or blocked. This allows bacteria, viruses, and fungi to grow, which leads to infection. Most infections of the sinuses are caused by a virus. Sinusitis can develop quickly. It can last for up to 4 weeks (acute) or for more than 12 weeks (chronic). Sinusitis often develops after a cold. What are the causes? This condition is caused by anything that creates swelling in the sinuses or stops mucus from draining. This includes:  Allergies.  Asthma.  Infection from bacteria or viruses.  Deformities or blockages in your nose or sinuses.  Abnormal growths in the nose (nasal polyps).  Pollutants, such as chemicals or irritants in the air.  Infection from fungi (rare). What increases the risk? You are more likely to develop this condition if you:  Have a weak body defense system (immune system).  Do a lot of swimming or diving.  Overuse nasal sprays.  Smoke. What are the signs or symptoms? The main symptoms of this condition are pain and a feeling of pressure around the affected sinuses. Other symptoms include:  Stuffy nose or congestion.  Thick drainage from your nose.  Swelling and warmth over the affected sinuses.  Headache.  Upper toothache.  A cough that may get worse at night.  Extra mucus that collects in the throat or the back of the nose (postnasal drip).  Decreased sense of smell and taste.  Fatigue.  A fever.  Sore throat.  Bad breath. How is this diagnosed? This condition is diagnosed based on:  Your symptoms.  Your medical history.  A physical exam.  Tests to find out if your condition is  acute or chronic. This may include: ? Checking your nose for nasal polyps. ? Viewing your sinuses using a device that has a light (endoscope). ? Testing for allergies or bacteria. ? Imaging tests, such as an MRI or CT scan. In rare cases, a bone biopsy may be done to rule out more serious types of fungal sinus disease. How is this treated? Treatment for sinusitis depends on the cause and whether your condition is chronic or acute.  If caused by a virus, your symptoms should go away on their own within 10 days. You may be given medicines to relieve symptoms. They include: ? Medicines that shrink swollen nasal passages (topical intranasal decongestants). ? Medicines that treat allergies (antihistamines). ? A spray that eases inflammation of the nostrils (topical intranasal corticosteroids). ? Rinses that help get rid of thick mucus in your nose (nasal saline washes).  If caused by bacteria, your health care provider may recommend waiting to see if your symptoms improve. Most bacterial infections will get better without antibiotic medicine. You may be given antibiotics if you have: ? A severe infection. ? A weak immune system.  If caused by narrow nasal passages or nasal polyps, you may need to have surgery. Follow these instructions at home: Medicines  Take, use, or apply over-the-counter and prescription medicines only as told by your health care provider. These may include nasal sprays.  If you were prescribed an antibiotic medicine, take it as told by your health care provider. Do not stop taking the antibiotic even if you start   to feel better. Hydrate and humidify   Drink enough fluid to keep your urine pale yellow. Staying hydrated will help to thin your mucus.  Use a cool mist humidifier to keep the humidity level in your home above 50%.  Inhale steam for 10-15 minutes, 3-4 times a day, or as told by your health care provider. You can do this in the bathroom while a hot shower is  running.  Limit your exposure to cool or dry air. Rest  Rest as much as possible.  Sleep with your head raised (elevated).  Make sure you get enough sleep each night. General instructions   Apply a warm, moist washcloth to your face 3-4 times a day or as told by your health care provider. This will help with discomfort.  Wash your hands often with soap and water to reduce your exposure to germs. If soap and water are not available, use hand sanitizer.  Do not smoke. Avoid being around people who are smoking (secondhand smoke).  Keep all follow-up visits as told by your health care provider. This is important. Contact a health care provider if:  You have a fever.  Your symptoms get worse.  Your symptoms do not improve within 10 days. Get help right away if:  You have a severe headache.  You have persistent vomiting.  You have severe pain or swelling around your face or eyes.  You have vision problems.  You develop confusion.  Your neck is stiff.  You have trouble breathing. Summary  Sinusitis is soreness and inflammation of your sinuses. Sinuses are hollow spaces in the bones around your face.  This condition is caused by nasal tissues that become inflamed or swollen. The swelling traps or blocks the flow of mucus. This allows bacteria, viruses, and fungi to grow, which leads to infection.  If you were prescribed an antibiotic medicine, take it as told by your health care provider. Do not stop taking the antibiotic even if you start to feel better.  Keep all follow-up visits as told by your health care provider. This is important. This information is not intended to replace advice given to you by your health care provider. Make sure you discuss any questions you have with your health care provider. Document Released: 10/23/2005 Document Revised: 03/25/2018 Document Reviewed: 03/25/2018 Elsevier Patient Education  2020 [employer]  

## 2019-08-11 NOTE — Assessment & Plan Note (Signed)
Pt will f/u ortho Will try to get mri for them--- pain is worsening--- if we have trouble she will try to f/u ortho sooner  Refill muscle relaxer

## 2019-08-11 NOTE — Assessment & Plan Note (Signed)
flonase and abx per orders If no relief of symptoms Refer to ENT

## 2019-08-13 ENCOUNTER — Telehealth: Payer: Self-pay

## 2019-08-13 NOTE — Telephone Encounter (Signed)
Lift no more than 10 lbs for 1 month ---- -- unless ortho changes it Has she called to make appointment with ortho yet

## 2019-08-13 NOTE — Telephone Encounter (Signed)
Left VM the letter was ready for pick up at the front desk.

## 2019-08-13 NOTE — Telephone Encounter (Signed)
Copied from [place] [id number]. Topic: General - Other >> Aug 12, 2019  2:55 PM Mathis Bud wrote: Reason for CRM: Patient is requesting a letter for work stating she needs to not lift anything to do her shoulder.  She works at [employer] and has difficulty lifting.  Patient is requesting a call back when letter is ready for her to pick up.  Call back # [phone]

## 2019-08-25 ENCOUNTER — Other Ambulatory Visit: Payer: Federal, State, Local not specified - PPO

## 2019-08-25 ENCOUNTER — Telehealth: Payer: Self-pay | Admitting: *Deleted

## 2019-08-25 ENCOUNTER — Ambulatory Visit (INDEPENDENT_AMBULATORY_CARE_PROVIDER_SITE_OTHER): Payer: Federal, State, Local not specified - PPO | Admitting: Medical

## 2019-08-25 DIAGNOSIS — E1140 Type 2 diabetes mellitus with diabetic neuropathy, unspecified: Secondary | ICD-10-CM | POA: Diagnosis not present

## 2019-08-25 DIAGNOSIS — R05 Cough: Secondary | ICD-10-CM | POA: Diagnosis not present

## 2019-08-25 DIAGNOSIS — R682 Dry mouth, unspecified: Secondary | ICD-10-CM | POA: Diagnosis not present

## 2019-08-25 DIAGNOSIS — R059 Cough, unspecified: Secondary | ICD-10-CM

## 2019-08-25 DIAGNOSIS — J329 Chronic sinusitis, unspecified: Secondary | ICD-10-CM | POA: Diagnosis not present

## 2019-08-25 DIAGNOSIS — M25511 Pain in right shoulder: Secondary | ICD-10-CM

## 2019-08-25 DIAGNOSIS — G8929 Other chronic pain: Secondary | ICD-10-CM

## 2019-08-25 MED ORDER — BUDESONIDE-FORMOTEROL FUMARATE 160-4.5 MCG/ACT IN AERO: 2.0000 | INHALATION_SPRAY | Freq: Two times a day (BID) | RESPIRATORY_TRACT | 0 refills | Status: DC

## 2019-08-25 MED ORDER — AZITHROMYCIN 250 MG PO TABS: ORAL_TABLET | ORAL | 0 refills | Status: DC

## 2019-08-25 MED ORDER — BENZONATATE 100 MG PO CAPS: 100.0000 mg | ORAL_CAPSULE | Freq: Three times a day (TID) | ORAL | 0 refills | Status: DC | PRN

## 2019-08-25 NOTE — Telephone Encounter (Signed)
Patient was asking about alprazolam and appt for mri to be done at [facility].  Patient has taken all alprazolam that was given to take before her mri.  It looks like Dr. Louanne Skye ordered the MRI and she want it done at [facility].  I will call Dr. Louanne Skye office tomorrow to see if they got it rescheduled for her over there.

## 2019-08-25 NOTE — Progress Notes (Signed)
   Subjective:    Patient ID: Rhonda Morrow, female    DOB: Dec 30, 1960, 58 y.o.   MRN: [id number]  HPI   Virtual Visit via Telephone Note  I connected with Rhonda Morrow on 08/25/19 at  3:40 PM EDT by telephone and verified that I am speaking with the correct person using two identifiers.  Location: Patient: home Provider: visit  Being assissted by sign language interpretor.   I discussed the limitations, risks, security and privacy concerns of performing an evaluation and management service by telephone and the availability of in person appointments. I also discussed with the patient that there may be a patient responsible charge related to this service. The patient expressed understanding and agreed to proceed.   History of Present Illness:  Pt had cough recently this seemed related to dry throat. No productive cough. Pt did try some mucinex yesterday.   Pt has prescription of cefdnir and also has nasal spray. Pt was only using cefdinr one tab a day.  Treated for sinus infection on October 5th. She still has some residual sinus pain  Pt has diabetes and she has not done checked her sugar recently. Last a1c was 05-23-2019.  Today sugar level 112.    Observations/Objective: Exam done though sign language interpretor who can see pt. No abnormal appearance reported by interpretor.  Assessment and Plan: For persisting sinus infection can continue cefdnir but use twice daily. If by end of 3 days cefdnir still has sinus pain then  add azithromycin to regimen.  For nasal congestion continue with flonase.    Benzonatate rx sent for cough to use if needed.  Will refill symbicort inhaler to use if needed(for tight airways/sob).  Keep checking sugars daily to make sure dry mouth sensation not high sugar eleated. Hydrate well. Get scheduled next week for cmp and a1c.  Follow up this Friday before weekend if not feeling better as weekend approaches.  25 minutes spent with pt. 50%  of time spent counseling pt on plan going forwared.  Follow Up Instructions:    I discussed the assessment and treatment plan with the patient. The patient was provided an opportunity to ask questions and all were answered. The patient agreed with the plan and demonstrated an understanding of the instructions.   The patient was advised to call back or seek an in-person evaluation if the symptoms worsen or if the condition fails to improve as anticipated.  I provided 25 +minutes of non-face-to-face time during this encounter.   Mackie Pai, PA-C    Review of Systems  Constitutional: Negative for chills, fatigue and fever.       On and off feel cold and then warm.  Respiratory: Positive for cough. Negative for apnea, chest tightness, shortness of breath and wheezing.   Cardiovascular: Negative for chest pain and palpitations.  Endocrine: Negative for polyphagia and polyuria.       Pt throat is dry.    Pt urinates a lot because hydrating alo.  Musculoskeletal: Negative for arthralgias and back pain.       Objective:   Physical Exam        Assessment & Plan:  Advised not to take colchicine with azithromycin. Pt expressed understanding. Also not working presently.

## 2019-08-25 NOTE — Patient Instructions (Addendum)
For persisting sinus infection can continue cefdnir but use twice daily. If by end of 3 days cefdnir still has sinus pain then  add azithromycin to regimen.  For nasal congestion continue with flonase.    Benzonatate rx sent for cough to use if needed.  Will refill symbicort inhaler to use if needed(for tight airways/sob).  Keep checking sugars daily to make sure dry mouth sensation not high sugar eleated. Hydrate well. Get scheduled next week for cmp and a1c.  Follow up this Friday before weekend if not feeling better as weekend approaches.

## 2019-08-26 ENCOUNTER — Other Ambulatory Visit: Payer: Self-pay

## 2019-08-26 DIAGNOSIS — Z20828 Contact with and (suspected) exposure to other viral communicable diseases: Secondary | ICD-10-CM | POA: Diagnosis not present

## 2019-08-26 DIAGNOSIS — Z20822 Contact with and (suspected) exposure to covid-19: Secondary | ICD-10-CM

## 2019-08-26 NOTE — Telephone Encounter (Signed)
Patient is returning a call to [place].  Please call back to discuss.  CB# [phone]

## 2019-08-26 NOTE — Telephone Encounter (Signed)
Left message on machine to call back.  Rhonda Morrow also wanted me to ask about how often she was taking the alprazolam?

## 2019-08-26 NOTE — Telephone Encounter (Signed)
Left message again.  Please put patient through to office when she calls back.

## 2019-08-27 ENCOUNTER — Other Ambulatory Visit: Payer: Self-pay | Admitting: Family Medicine

## 2019-08-27 DIAGNOSIS — G8929 Other chronic pain: Secondary | ICD-10-CM

## 2019-08-27 DIAGNOSIS — M25511 Pain in right shoulder: Secondary | ICD-10-CM

## 2019-08-27 MED ORDER — ALPRAZOLAM 0.5 MG PO TABS: ORAL_TABLET | ORAL | 0 refills | Status: DC

## 2019-08-27 NOTE — Telephone Encounter (Signed)
Patient stated that she needs her MRI done at [facility].  Called [facility] to see why they canceled MRI and they stated that they did not have an open MRI.  They only have a wide one.  Order faxed to [facility] and patient notified.    Can you send in alprazolam for her as she took the others?      She stated that she had only taken the alparazolam once a day.  [facility] notified.

## 2019-08-27 NOTE — Telephone Encounter (Signed)
I have sent it in again but remind her it is for the procedure --- not for everyday

## 2019-08-27 NOTE — Telephone Encounter (Signed)
Left detailed message that medication has been sent in and only use it 30 min prior to MRI.

## 2019-08-28 ENCOUNTER — Encounter: Payer: Self-pay | Admitting: Family Medicine

## 2019-08-28 ENCOUNTER — Other Ambulatory Visit: Payer: Self-pay | Admitting: Family Medicine

## 2019-08-28 ENCOUNTER — Ambulatory Visit: Payer: Self-pay | Admitting: *Deleted

## 2019-08-28 ENCOUNTER — Other Ambulatory Visit: Payer: Self-pay

## 2019-08-28 ENCOUNTER — Ambulatory Visit (INDEPENDENT_AMBULATORY_CARE_PROVIDER_SITE_OTHER): Payer: Federal, State, Local not specified - PPO | Admitting: Family Medicine

## 2019-08-28 DIAGNOSIS — U071 COVID-19: Secondary | ICD-10-CM | POA: Diagnosis not present

## 2019-08-28 DIAGNOSIS — E1140 Type 2 diabetes mellitus with diabetic neuropathy, unspecified: Secondary | ICD-10-CM

## 2019-08-28 LAB — NOVEL CORONAVIRUS, NAA: SARS-CoV-2, NAA: DETECTED — AB

## 2019-08-28 NOTE — Progress Notes (Signed)
[place] [facility] [address] [place], [place] [address] [phone] [id number]  Date:  08/28/2019   Name:  Rhonda Morrow   DOB:  January 08, 1961   MRN:  [id number]  PCP:  Ann Held, DO    Chief Complaint: No chief complaint on file.   History of Present Illness:  Rhonda Morrow is a 58 y.o. very pleasant female patient who presents with the following:  Primary patient of my partner Dr. Etter Sjogren, virtual visit today for illness History of controlled diabetes, hyperlipidemia, obesity, deafness I have not seen her in the past She did test positive for COVID-19 2 days ago-her husband became ill, so they were both tested.  He also tested positive  Patient location is home, provider is at office Visit today is accomplished with the assistance of deaf interpreter Patient identity confirmed with 2 factors, she gives consent for virtual visit today  Lab Results  Component Value Date   HGBA1C 6.4 05/23/2019   Virtual visit today to discuss Covid symptoms Marylea is not checking her temperature, blood pressure or pulse She notes that her tongue feels "weird," throat is dry, she notes a little SOB She feels like no matter how much liquid she drinks, she still feels like her mouth is dry No vomiting or diarrhea Discussed the variable course of COVID-19, can be very different from person to person.  I am not able to see the patient in person, and we do not have any vital signs information She is not checking her blood sugar.  Per patient report, her husband is sicker than she is.  I advised her that he likely should be seen at the emergency room for further evaluation   Patient Active Problem List   Diagnosis Date Noted  . Chronic right shoulder pain 08/11/2019  . Acute non-recurrent maxillary sinusitis 08/11/2019  . BMI 40.0-44.9, adult ([place]) 01/22/2019  . RLQ abdominal pain 10/20/2018  . Rectal pain 10/20/2018  . Vitamin B 12  deficiency 03/28/2018  . B12 deficiency 03/28/2018  . DM (diabetes mellitus) type II uncontrolled, periph vascular disorder ([place]) 03/28/2018  . Hyperlipidemia 03/28/2018  . Primary osteoarthritis of left hip 02/21/2017  . 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 History of laparoscopic cholecystectomy 06/20/2016  . Cerumen impaction 06/09/2016  . Hypersomnia 05/16/2016  . Knee pain, right 05/05/2016  . RUQ pain 04/09/2016  . Abnormal CT scan 03/28/2016  . Dyspnea and respiratory abnormalities 03/15/2016  . Asthma with acute exacerbation 03/15/2016  . Asthma in adult 02/25/2016  . DM (diabetes mellitus) type II controlled, neurological manifestation ([place]) 02/09/2016  . Left hip pain 12/23/2015  . Right hamstring muscle strain 11/18/2015  . Abdominal pain, acute 09/01/2015  . Acute asthma exacerbation 04/16/2015  . Acute bronchitis 04/14/2015  . Vaginal discharge 09/14/2014  . Pap smear for cervical cancer screening 09/14/2014  . Scabies 05/06/2014  . Bed bug bite 05/06/2014  . Allergic rhinitis 04/09/2014  . Rectal itching 04/09/2014  . Bladder spasm 04/09/2014  . Knee pain, left 03/05/2014  . Hypokalemia 02/19/2014  . Nausea with vomiting 02/19/2014  . Diabetes mellitus, type II ([place]) 02/19/2014  . Edema 02/16/2014  . Disorder of rotator cuff 11/12/2013  . Routine general medical examination at a health care facility 08/20/2013  . GERD (gastroesophageal reflux disease) 06/26/2013  . Dysphagia, pharyngoesophageal phase 06/26/2013  . Suprapubic pain 05/12/2013  . Medication side effect 03/25/2013  .  Diarrhea 03/25/2013  . Benign positional vertigo 01/30/2013  . Traumatic hematoma of thigh 01/15/2013  . HTN (hypertension) 09/02/2012  . Dry skin 08/22/2012  . External hemorrhoids 08/22/2012  . Obesity 06/30/2012  . Thyromegaly 05/22/2012  . Back pain 05/22/2012  . Elevated glucose 05/22/2012  . Moderate persistent asthma 04/19/2012  . Dyspnea 01/28/2012     Past Medical History:  Diagnosis Date  . Arthritis   . Asthma   . Complication of anesthesia    one time woke up and was vey scared,17 yrs ago  . Deaf   . Diabetes mellitus without complication ([place])   . GERD (gastroesophageal reflux disease)   . Hypertension   . Left groin pain   . Thyroid disease     Past Surgical History:  Procedure Laterality Date  . CARDIAC CATHETERIZATION    . CESAREAN SECTION    . CHOLECYSTECTOMY N/A 06/20/2016   Procedure: LAPAROSCOPIC CHOLECYSTECTOMY;  Surgeon: Rolm Bookbinder, MD;  Location: [facility];  Service: General;  Laterality: N/A;  . ESOPHAGEAL MANOMETRY N/A 09/29/2013   Procedure: ESOPHAGEAL MANOMETRY (EM);  Surgeon: Milus Banister, MD;  Location: [facility];  Service: Endoscopy;  Laterality: N/A;  . TOTAL HIP ARTHROPLASTY Left 02/21/2017   Procedure: LEFT TOTAL HIP ARTHROPLASTY ANTERIOR APPROACH;  Surgeon: Leandrew Koyanagi, MD;  Location: [facility];  Service: Orthopedics;  Laterality: Left;    Social History   Tobacco Use  . Smoking status: Never Smoker  . Smokeless tobacco: Never Used  Substance Use Topics  . Alcohol use: No    Alcohol/week: 0.0 standard drinks  . Drug use: No    Family History  Problem Relation Age of Onset  . Diabetes Mother   . Heart disease Mother   . Diabetes Father     Allergies  Allergen Reactions  . Losartan Shortness Of Breath  . Oxycodone-Acetaminophen Nausea And Vomiting  . Augmentin [Amoxicillin-Pot Clavulanate] Diarrhea    Medication list has been reviewed and updated.  Current Outpatient Medications on File Prior to Visit  Medication Sig Dispense Refill  . albuterol (PROVENTIL HFA;VENTOLIN HFA) 108 (90 Base) MCG/ACT inhaler Inhale 2 puffs into the lungs every 6 (six) hours as needed for wheezing. 1 Inhaler 5  . allopurinol (ZYLOPRIM) 100 MG tablet TAKE 1 TABLET BY MOUTH EVERY DAY (Patient taking differently: Take 100 mg by mouth daily. ) 90 tablet 2  . ALPRAZolam (XANAX) 0.5 MG tablet 1 tab po  30 min prior to procedure and prn 10 tablet 0  . amLODipine (NORVASC) 5 MG tablet TAKE 1 TABLET BY MOUTH EVERY DAY 90 tablet 1  . aspirin EC 325 MG tablet Take 1 tablet (325 mg total) by mouth 2 (two) times daily. 84 tablet 0  . azelastine (ASTELIN) 0.1 % nasal spray Place 2 sprays into both nostrils 2 (two) times daily. Use in each nostril as directed 30 mL 3  . azithromycin (ZITHROMAX) 250 MG tablet Take 2 tablets by mouth on day 1, followed by 1 tablet by mouth daily for 4 days. 6 tablet 0  . benzonatate (TESSALON) 100 MG capsule Take 1 capsule (100 mg total) by mouth 3 (three) times daily as needed for cough. 30 capsule 0  . budesonide-formoterol (SYMBICORT) 160-4.5 MCG/ACT inhaler Inhale 2 puffs into the lungs every 12 (twelve) hours. 1 Inhaler 0  . cefdinir (OMNICEF) 300 MG capsule Take 300 mg by mouth 2 (two) times daily.    . cetirizine (ZYRTEC) 10 MG tablet Take 1 tablet (10 mg total)  by mouth daily. No Therapeutic Substitution 90 tablet 1  . cholestyramine (QUESTRAN) 4 GM/DOSE powder Take 1 packet (4 g total) by mouth daily for 30 days. 30 packet 3  . colchicine 0.6 MG tablet Take 1 tablet (0.6 mg total) by mouth daily. 90 tablet 1  . Cyanocobalamin (B-12) 1000 MCG SUBL Dissolove 1 tablet under tongue once a day 90 each 1  . cyclobenzaprine (FLEXERIL) 10 MG tablet Take 1 tablet (10 mg total) by mouth 3 (three) times daily as needed for muscle spasms. 30 tablet 0  . cyclobenzaprine (FLEXERIL) 5 MG tablet Take 1 tablet (5 mg total) by mouth at bedtime. 10 tablet 1  . diclofenac (VOLTAREN) 75 MG EC tablet Take 1 tablet (75 mg total) by mouth 2 (two) times daily as needed. 60 tablet 3  . diclofenac sodium (VOLTAREN) 1 % GEL Apply 4 g topically 4 (four) times daily as needed. 500 g 6  . Fiber CHEW Chew 1 tablet by mouth daily.    . fluticasone (FLONASE) 50 MCG/ACT nasal spray PLACE 2 SPRAYS INTO BOTH NOSTRILS DAILY. 16 g 4  . fluticasone (FLONASE) 50 MCG/ACT nasal spray Place 2 sprays into  both nostrils daily. 16 g 6  . glucose blood test strip Use as instructed--- one touch verio test strips 100 each 12  . hydrochlorothiazide (HYDRODIURIL) 12.5 MG tablet TAKE 1 TABLET BY MOUTH EVERY DAY 90 tablet 1  . hydrOXYzine (ATARAX/VISTARIL) 10 MG tablet TAKE 1 TO 2 TABLETS BY MOUTH AT BEDTIME AS NEEDED FOR ITCHING 180 tablet 1  . ipratropium-albuterol (DUONEB) 0.5-2.5 (3) MG/3ML SOLN Take 3 mLs by nebulization every 6 (six) hours as needed. 360 mL 3  . KLOR-CON M20 20 MEQ tablet TAKE 1 TABLET BY MOUTH EVERY DAY 90 tablet 1  . Lancets (ONETOUCH ULTRASOFT) lancets Use as instructed 100 each 12  . meclizine (ANTIVERT) 12.5 MG tablet Take 1 tablet (12.5 mg total) by mouth 3 (three) times daily as needed for dizziness. 30 tablet 0  . metaxalone (SKELAXIN) 800 MG tablet TAKE 1 TABLET BY MOUTH THREE TIMES A DAY (Patient taking differently: Take 800 mg by mouth 3 (three) times daily. ) 30 tablet 2  . metFORMIN (GLUCOPHAGE-XR) 500 MG 24 hr tablet TAKE 1 TABLET BY MOUTH EVERY DAY WITH BREAKFAST 90 tablet 1  . mometasone-formoterol (DULERA) 100-5 MCG/ACT AERO Inhale 2 puffs into the lungs 2 (two) times daily. 1 Inhaler PRN  . montelukast (SINGULAIR) 10 MG tablet Take 1 tablet (10 mg total) by mouth at bedtime. 90 tablet 3  . Multiple Vitamin (MULTIVITAMIN) tablet Take 1 tablet by mouth daily.    . NONFORMULARY OR COMPOUNDED ITEM Nebulizer   DX ASTHMA 1 each 0  . nystatin-triamcinolone (MYCOLOG II) cream Apply twice daily to lower chest area/upper abdomen 30 g 0  . ondansetron (ZOFRAN ODT) 4 MG disintegrating tablet Take 1 tablet (4 mg total) by mouth every 8 (eight) hours as needed for nausea or vomiting. 10 tablet 0  . pantoprazole (PROTONIX) 40 MG tablet Take 1 tablet (40 mg total) by mouth daily. 30 tablet 3  . ranitidine (ZANTAC) 150 MG tablet TAKE 1 TABLET BY MOUTH TWICE A DAY (Patient taking differently: Take 150 mg by mouth 2 (two) times daily. ) 180 tablet 1  . Spacer/Aero-Holding Chambers  (AEROCHAMBER MV) inhaler Use as instructed 1 each 0  . traMADol (ULTRAM) 50 MG tablet TAKE 1 TABLET (50 MG TOTAL) BY MOUTH EVERY 8 (EIGHT) HOURS AS NEEDED FOR UP TO 5  DAYS. 16 tablet 0  . triamcinolone cream (KENALOG) 0.1 % Apply bid to hands 30 g 0  . zolpidem (AMBIEN) 10 MG tablet Take 1 tablet (10 mg total) by mouth at bedtime as needed. for sleep 15 tablet 1   No current facility-administered medications on file prior to visit.     Review of Systems:  As per HPI- otherwise negative.   Physical Examination: There were no vitals filed for this visit. There were no vitals filed for this visit. There is no height or weight on file to calculate BMI. Ideal Body Weight:    Spoke with patient on phone using video sign language interpreter  Assessment and Plan: COVID-19  Type 2 diabetes mellitus with diabetic neuropathy, without long-term current use of insulin ([place])  Virtual visit today to discuss COVID-19 infection.  Discussed in detail with patient through sign language interpreter.  It is difficult for me to know how sick she may be, I am seeing her through telephone, and she has not been able to obtain any information such as blood sugar, temperature, or blood pressure.  She notes that she feels like her mouth is dry, she notes mild respiratory symptoms.  I advised her that her most conservative option would to be seen in the emergency room to make sure she is okay.  She will take this under consideration and will go if she feels necessary  She requests a work note which I will send to her MyChart account   She has described her husbands symptoms being more severe, I encouraged her to have him seen at the ER ASAP  Signed Lamar Blinks, MD  Work note to [employer] account

## 2019-08-28 NOTE — Telephone Encounter (Signed)
Patient is calling back regarding advice for a positve COVID result. Asking is there medication she can take for COVID. Please advise. Patient is deaf.    Returned call to patient regarding treating her symptoms for covid-19. She has a cough, sore throat and some chest tightness. She is advised to try the OTC cough medications, honey, lemon and hot tea., increase her fluids. She can use 2 teaspoons of honey at bedtime and cough drops and throat lozenges for her sore throat.  She can take Tylenol or Advil for the body aches.  Should be checking her temperature and treating as needed.  She voiced understanding, will call back for any other questions.  Reason for Disposition . Health Information question, no triage required and triager able to answer question  Answer Assessment - Initial Assessment Questions 1. REASON FOR CALL or QUESTION: "What is your reason for calling today?" or "How can I best help you?" or "What question do you have that I can help answer?"      Request information to treat covid sypmtoms.  Protocols used: INFORMATION ONLY CALL - NO TRIAGE-A-AH

## 2019-09-03 ENCOUNTER — Ambulatory Visit: Payer: Federal, State, Local not specified - PPO | Admitting: Specialist

## 2019-09-10 ENCOUNTER — Other Ambulatory Visit: Payer: Self-pay

## 2019-09-10 DIAGNOSIS — Z20822 Contact with and (suspected) exposure to covid-19: Secondary | ICD-10-CM

## 2019-09-11 LAB — NOVEL CORONAVIRUS, NAA: SARS-CoV-2, NAA: NOT DETECTED

## 2019-09-12 ENCOUNTER — Telehealth: Payer: Self-pay | Admitting: Family Medicine

## 2019-09-12 NOTE — Telephone Encounter (Signed)
Patient is calling to receive her negative COVID results. Patient expressed understanding. °

## 2019-09-23 ENCOUNTER — Other Ambulatory Visit: Payer: Self-pay

## 2019-09-23 DIAGNOSIS — Z20822 Contact with and (suspected) exposure to covid-19: Secondary | ICD-10-CM

## 2019-09-24 LAB — NOVEL CORONAVIRUS, NAA: SARS-CoV-2, NAA: NOT DETECTED

## 2019-09-25 ENCOUNTER — Ambulatory Visit: Payer: Self-pay

## 2019-09-25 NOTE — Telephone Encounter (Signed)
Patient was given NEGATIVE COVID results using services from the relay interpreter.

## 2019-09-26 ENCOUNTER — Other Ambulatory Visit: Payer: Self-pay | Admitting: *Deleted

## 2019-09-26 ENCOUNTER — Telehealth: Payer: Self-pay | Admitting: *Deleted

## 2019-09-26 DIAGNOSIS — M62838 Other muscle spasm: Secondary | ICD-10-CM

## 2019-09-26 DIAGNOSIS — G8929 Other chronic pain: Secondary | ICD-10-CM

## 2019-09-26 MED ORDER — CYCLOBENZAPRINE HCL 10 MG PO TABS: 10.0000 mg | ORAL_TABLET | Freq: Three times a day (TID) | ORAL | 0 refills | Status: DC | PRN

## 2019-09-26 NOTE — Telephone Encounter (Signed)
Patient was positive 1 month ago and 3 days ago tested negative.  Can I write her to go back to work tomorrow?  Can you do this in [place] absence?

## 2019-09-26 NOTE — Telephone Encounter (Signed)
That's fine as long as her s/s's have improved and no fevers for 24 hrs. Ty.

## 2019-09-26 NOTE — Telephone Encounter (Signed)
Left message on machine that if she is having no symptoms that we will give note to go back.  Call me as soon as possible. If no call by 5 then we will write on Monday.

## 2019-09-26 NOTE — Telephone Encounter (Signed)
Copied from [place] [id number]. Topic: General - Other >> Sep 25, 2019  3:14 PM Alanda Slim E wrote: Reason for CRM: Pt was out of work due to husband having positive covid test. They have been in quarantine until her husband had a positive test and she needs a note to return to work. Pts husband is Mikie Battle DOB 6.13.51/ Pt would like to return to work tomorrow / please advise

## 2019-09-29 ENCOUNTER — Telehealth: Payer: Self-pay | Admitting: Family Medicine

## 2019-09-29 NOTE — Telephone Encounter (Signed)
Pt is wanting a FU call on returning to work is doing okay but still has a small cough, has went back to work and can not be home till after 4:00. Her job needs the note still even as she is working with a cough. FU at [phone]

## 2019-09-30 ENCOUNTER — Encounter: Payer: Self-pay | Admitting: *Deleted

## 2019-09-30 NOTE — Telephone Encounter (Signed)
Any other advise for patient.  I advised on vm on Friday that she should remain out of work until note is given.  Apparently she has a cough and went to work anyway.

## 2019-09-30 NOTE — Telephone Encounter (Signed)
She should quarantine 10 days from positive test -- no fever 72 hours (with no meds) and symptoms sig improved--- they don't have to be completely gone

## 2019-09-30 NOTE — Telephone Encounter (Signed)
Patient notified.  She stated symptoms are much better.  Note written and placed up front for patient to pickup.

## 2019-10-22 ENCOUNTER — Other Ambulatory Visit: Payer: Self-pay | Admitting: Family Medicine

## 2019-10-22 DIAGNOSIS — I10 Essential (primary) hypertension: Secondary | ICD-10-CM

## 2019-10-24 ENCOUNTER — Other Ambulatory Visit: Payer: Self-pay | Admitting: Family Medicine

## 2019-10-24 DIAGNOSIS — M10079 Idiopathic gout, unspecified ankle and foot: Secondary | ICD-10-CM

## 2019-10-24 NOTE — Telephone Encounter (Signed)
Last OV 08/28/19 Last refill 08/19/18 #90/2 Next OV not scheduled

## 2019-10-27 ENCOUNTER — Other Ambulatory Visit: Payer: Self-pay | Admitting: Family Medicine

## 2019-12-05 ENCOUNTER — Other Ambulatory Visit: Payer: Self-pay | Admitting: Family Medicine

## 2019-12-05 DIAGNOSIS — Z889 Allergy status to unspecified drugs, medicaments and biological substances status: Secondary | ICD-10-CM

## 2019-12-10 ENCOUNTER — Encounter: Payer: Self-pay | Admitting: Family Medicine

## 2019-12-10 ENCOUNTER — Other Ambulatory Visit: Payer: Self-pay

## 2019-12-10 ENCOUNTER — Ambulatory Visit: Payer: Federal, State, Local not specified - PPO | Admitting: Family Medicine

## 2019-12-10 DIAGNOSIS — R1031 Right lower quadrant pain: Secondary | ICD-10-CM

## 2019-12-10 NOTE — Progress Notes (Signed)
Virtual Visit via Telephone Note  I connected with [name] on 12/10/19 at  9:20 AM EST by telephone and verified that I am speaking with the correct person using two identifiers.  Location: Patient: home with husband and interpreter on line Provider: home    I discussed the limitations, risks, security and privacy concerns of performing an evaluation and management service by telephone and the availability of in person appointments. I also discussed with the patient that there may be a patient responsible charge related to this service. The patient expressed understanding and agreed to proceed.   History of Present Illness: Last Sunday her stomach was hurting very bad--- she had to skip work  Pt is in low right side and comes and goes +cramping    Observations/Objective: No vitals obtained but pt states no fevers  Pt is in NAD  Assessment and Plan: 1. Right lower quadrant abdominal pain Pt will come in tomorrow for in office visit  Go to ER if pain worsens    Follow Up Instructions:    I discussed the assessment and treatment plan with the patient. The patient was provided an opportunity to ask questions and all were answered. The patient agreed with the plan and demonstrated an understanding of the instructions.   The patient was advised to call back or seek an in-person evaluation if the symptoms worsen or if the condition fails to improve as anticipated.  I provided 15 minutes of non-face-to-face time during this encounter.   Ann Held, DO

## 2019-12-11 ENCOUNTER — Ambulatory Visit: Payer: Federal, State, Local not specified - PPO | Admitting: Family Medicine

## 2019-12-11 ENCOUNTER — Encounter: Payer: Self-pay | Admitting: Family Medicine

## 2019-12-11 VITALS — BP 110/68 | HR 92 | Temp 97.5°F | Resp 18 | Ht 62.0 in | Wt 237.4 lb

## 2019-12-11 DIAGNOSIS — R1031 Right lower quadrant pain: Secondary | ICD-10-CM

## 2019-12-11 LAB — CBC WITH DIFFERENTIAL/PLATELET
Basophils Absolute: 0 10*3/uL (ref 0.0–0.1)
Basophils Relative: 0.6 % (ref 0.0–3.0)
Eosinophils Absolute: 0.1 10*3/uL (ref 0.0–0.7)
Eosinophils Relative: 1.7 % (ref 0.0–5.0)
HCT: 44.9 % (ref 36.0–46.0)
Hemoglobin: 14.5 g/dL (ref 12.0–15.0)
Lymphocytes Relative: 39.3 % (ref 12.0–46.0)
Lymphs Abs: 2.1 10*3/uL (ref 0.7–4.0)
MCHC: 32.2 g/dL (ref 30.0–36.0)
MCV: 86.2 fl (ref 78.0–100.0)
Monocytes Absolute: 0.5 10*3/uL (ref 0.1–1.0)
Monocytes Relative: 10.3 % (ref 3.0–12.0)
Neutro Abs: 2.5 10*3/uL (ref 1.4–7.7)
Neutrophils Relative %: 48.1 % (ref 43.0–77.0)
Platelets: 149 10*3/uL — ABNORMAL LOW (ref 150.0–400.0)
RBC: 5.21 Mil/uL — ABNORMAL HIGH (ref 3.87–5.11)
RDW: 14.3 % (ref 11.5–15.5)
WBC: 5.3 10*3/uL (ref 4.0–10.5)

## 2019-12-11 LAB — COMPREHENSIVE METABOLIC PANEL
ALT: 30 U/L (ref 0–35)
AST: 24 U/L (ref 0–37)
Albumin: 3.9 g/dL (ref 3.5–5.2)
Alkaline Phosphatase: 71 U/L (ref 39–117)
BUN: 15 mg/dL (ref 6–23)
CO2: 31 mEq/L (ref 19–32)
Calcium: 9.4 mg/dL (ref 8.4–10.5)
Chloride: 102 mEq/L (ref 96–112)
Creatinine, Ser: 0.87 mg/dL (ref 0.40–1.20)
GFR: 81.42 mL/min (ref >60.00)
Glucose, Bld: 186 mg/dL — ABNORMAL HIGH (ref 70–99)
Potassium: 3.3 mEq/L — ABNORMAL LOW (ref 3.5–5.1)
Sodium: 138 mEq/L (ref 135–145)
Total Bilirubin: 0.5 mg/dL (ref 0.2–1.2)
Total Protein: 6.7 g/dL (ref 6.0–8.3)

## 2019-12-11 NOTE — Progress Notes (Signed)
Patient ID: Rhonda Morrow, female    DOB: [birth year]  Age: 59 y.o. MRN: [id number]    Subjective:  Subjective  HPI Rhonda Morrow presents for abd pain since last Sunday.  She had to skip work   Pain comes and goes in RLQ  --- she feels a lump when she stands up.    No NVD  Review of Systems  Constitutional: Negative for activity change, appetite change, fatigue and unexpected weight change.  Respiratory: Negative for cough and shortness of breath.   Cardiovascular: Negative for chest pain and palpitations.  Gastrointestinal: Positive for abdominal pain. Negative for abdominal distention, anal bleeding, blood in stool, constipation, diarrhea, nausea, rectal pain and vomiting.  Psychiatric/Behavioral: Negative for behavioral problems and dysphoric mood. The patient is not nervous/anxious.     History Past Medical History:  Diagnosis Date  . Arthritis   . Asthma   . Complication of anesthesia    one time woke up and was vey scared,17 yrs ago  . Deaf   . Diabetes mellitus without complication ([place])   . GERD (gastroesophageal reflux disease)   . Hypertension   . Left groin pain   . Thyroid disease     She has a past surgical history that includes Cesarean section; Cardiac catheterization; Esophageal manometry (N/A, 09/29/2013); Cholecystectomy (N/A, 06/20/2016); and Total hip arthroplasty (Left, 02/21/2017).   Her family history includes Diabetes in her father and mother; Heart disease in her mother.She reports that she has never smoked. She has never used smokeless tobacco. She reports that she does not drink alcohol or use drugs.  Current Outpatient Medications on File Prior to Visit  Medication Sig Dispense Refill  . albuterol (PROVENTIL HFA;VENTOLIN HFA) 108 (90 Base) MCG/ACT inhaler Inhale 2 puffs into the lungs every 6 (six) hours as needed for wheezing. 1 Inhaler 5  . allopurinol (ZYLOPRIM) 100 MG tablet TAKE 1 TABLET BY MOUTH EVERY DAY 90 tablet 1  . ALPRAZolam (XANAX) 0.5 MG  tablet 1 tab po 30 min prior to procedure and prn 10 tablet 0  . amLODipine (NORVASC) 5 MG tablet TAKE 1 TABLET BY MOUTH EVERY DAY 90 tablet 1  . aspirin EC 325 MG tablet Take 1 tablet (325 mg total) by mouth 2 (two) times daily. 84 tablet 0  . azelastine (ASTELIN) 0.1 % nasal spray Place 2 sprays into both nostrils 2 (two) times daily. Use in each nostril as directed 30 mL 3  . benzonatate (TESSALON) 100 MG capsule Take 1 capsule (100 mg total) by mouth 3 (three) times daily as needed for cough. 30 capsule 0  . budesonide-formoterol (SYMBICORT) 160-4.5 MCG/ACT inhaler Inhale 2 puffs into the lungs every 12 (twelve) hours. 1 Inhaler 0  . cetirizine (ZYRTEC) 10 MG tablet TAKE 1 TABLET (10 MG TOTAL) BY MOUTH DAILY. 90 tablet 1  . colchicine 0.6 MG tablet Take 1 tablet (0.6 mg total) by mouth daily. 90 tablet 1  . Cyanocobalamin (B-12) 1000 MCG SUBL Dissolove 1 tablet under tongue once a day 90 each 1  . cyclobenzaprine (FLEXERIL) 10 MG tablet Take 1 tablet (10 mg total) by mouth 3 (three) times daily as needed for muscle spasms. 30 tablet 0  . diclofenac (VOLTAREN) 75 MG EC tablet TAKE 1 TABLET (75 MG TOTAL) BY MOUTH 2 (TWO) TIMES DAILY AS NEEDED. 60 tablet 3  . diclofenac sodium (VOLTAREN) 1 % GEL Apply 4 g topically 4 (four) times daily as needed. 500 g 6  . Fiber CHEW Chew 1  tablet by mouth daily.    . fluticasone (FLONASE) 50 MCG/ACT nasal spray PLACE 2 SPRAYS INTO BOTH NOSTRILS DAILY. 16 g 4  . fluticasone (FLONASE) 50 MCG/ACT nasal spray Place 2 sprays into both nostrils daily. 16 g 6  . glucose blood test strip Use as instructed--- one touch verio test strips 100 each 12  . hydrochlorothiazide (HYDRODIURIL) 12.5 MG tablet TAKE 1 TABLET BY MOUTH EVERY DAY 90 tablet 1  . hydrOXYzine (ATARAX/VISTARIL) 10 MG tablet TAKE 1 TO 2 TABLETS BY MOUTH AT BEDTIME AS NEEDED FOR ITCHING 180 tablet 1  . ipratropium-albuterol (DUONEB) 0.5-2.5 (3) MG/3ML SOLN Take 3 mLs by nebulization every 6 (six) hours as  needed. 360 mL 3  . KLOR-CON M20 20 MEQ tablet TAKE 1 TABLET BY MOUTH EVERY DAY 90 tablet 1  . Lancets (ONETOUCH ULTRASOFT) lancets Use as instructed 100 each 12  . meclizine (ANTIVERT) 12.5 MG tablet Take 1 tablet (12.5 mg total) by mouth 3 (three) times daily as needed for dizziness. 30 tablet 0  . metaxalone (SKELAXIN) 800 MG tablet TAKE 1 TABLET BY MOUTH THREE TIMES A DAY (Patient taking differently: Take 800 mg by mouth 3 (three) times daily. ) 30 tablet 2  . metFORMIN (GLUCOPHAGE-XR) 500 MG 24 hr tablet TAKE 1 TABLET BY MOUTH EVERY DAY WITH BREAKFAST 90 tablet 1  . mometasone-formoterol (DULERA) 100-5 MCG/ACT AERO Inhale 2 puffs into the lungs 2 (two) times daily. 1 Inhaler PRN  . montelukast (SINGULAIR) 10 MG tablet Take 1 tablet (10 mg total) by mouth at bedtime. 90 tablet 3  . Multiple Vitamin (MULTIVITAMIN) tablet Take 1 tablet by mouth daily.    . NONFORMULARY OR COMPOUNDED ITEM Nebulizer   DX ASTHMA 1 each 0  . nystatin-triamcinolone (MYCOLOG II) cream Apply twice daily to lower chest area/upper abdomen 30 g 0  . ondansetron (ZOFRAN ODT) 4 MG disintegrating tablet Take 1 tablet (4 mg total) by mouth every 8 (eight) hours as needed for nausea or vomiting. 10 tablet 0  . pantoprazole (PROTONIX) 40 MG tablet Take 1 tablet (40 mg total) by mouth daily. 30 tablet 3  . ranitidine (ZANTAC) 150 MG tablet TAKE 1 TABLET BY MOUTH TWICE A DAY (Patient taking differently: Take 150 mg by mouth 2 (two) times daily. ) 180 tablet 1  . Spacer/Aero-Holding Chambers (AEROCHAMBER MV) inhaler Use as instructed 1 each 0  . traMADol (ULTRAM) 50 MG tablet TAKE 1 TABLET (50 MG TOTAL) BY MOUTH EVERY 8 (EIGHT) HOURS AS NEEDED FOR UP TO 5 DAYS. 16 tablet 0  . triamcinolone cream (KENALOG) 0.1 % Apply bid to hands 30 g 0  . zolpidem (AMBIEN) 10 MG tablet Take 1 tablet (10 mg total) by mouth at bedtime as needed. for sleep 15 tablet 1  . cefdinir (OMNICEF) 300 MG capsule Take 300 mg by mouth 2 (two) times daily.     . cholestyramine (QUESTRAN) 4 GM/DOSE powder Take 1 packet (4 g total) by mouth daily for 30 days. 30 packet 3   No current facility-administered medications on file prior to visit.     Objective:  Objective  Physical Exam Vitals and nursing note reviewed.  Constitutional:      Appearance: She is well-developed.  HENT:     Head: Normocephalic and atraumatic.  Eyes:     Conjunctiva/sclera: Conjunctivae normal.  Neck:     Thyroid: No thyromegaly.     Vascular: No carotid bruit or JVD.  Cardiovascular:     Rate and Rhythm:  Normal rate and regular rhythm.     Heart sounds: Normal heart sounds. No murmur.  Pulmonary:     Effort: Pulmonary effort is normal. No respiratory distress.     Breath sounds: Normal breath sounds. No wheezing or rales.  Chest:     Chest wall: No tenderness.  Abdominal:     General: There is no distension.     Tenderness: There is abdominal tenderness in the right lower quadrant.     Hernia: A hernia is present. Hernia is present in the right inguinal area.  Musculoskeletal:     Cervical back: Normal range of motion and neck supple.  Neurological:     Mental Status: She is alert and oriented to person, place, and time.    BP 110/68 (BP Location: Right Arm, Patient Position: Sitting, Cuff Size: Large)   Pulse 92   Temp (!) 97.5 F (36.4 C) (Temporal)   Resp 18   Ht 5\' 2"  (1.575 m)   Wt 237 lb 6.4 oz (107.7 kg)   LMP 05/20/2012   SpO2 99%   BMI 43.42 kg/m  Wt Readings from Last 3 Encounters:  12/11/19 237 lb 6.4 oz (107.7 kg)  08/11/19 240 lb 6.4 oz (109 kg)  07/31/19 240 lb 4.8 oz (109 kg)     Lab Results  Component Value Date   WBC 5.3 12/11/2019   HGB 14.5 12/11/2019   HCT 44.9 12/11/2019   PLT 149.0 (L) 12/11/2019   GLUCOSE 186 (H) 12/11/2019   CHOL 142 05/23/2019   TRIG 79.0 05/23/2019   HDL 54.00 05/23/2019   LDLCALC 72 05/23/2019   ALT 30 12/11/2019   AST 24 12/11/2019   NA 138 12/11/2019   K 3.3 (L) 12/11/2019   CL 102  12/11/2019   CREATININE 0.87 12/11/2019   BUN 15 12/11/2019   CO2 31 12/11/2019   TSH 1.36 05/14/2019   INR 1.05 02/12/2017   HGBA1C 6.4 05/23/2019   MICROALBUR 0.8 10/27/2015    CT Head Wo Contrast  Result Date: 07/31/2019 CLINICAL DATA:  Vertigo, episodic with dizziness EXAM: CT HEAD WITHOUT CONTRAST TECHNIQUE: Contiguous axial images were obtained from the base of the skull through the vertex without intravenous contrast. COMPARISON:  10/10/2016 FINDINGS: Brain: No evidence of acute infarction, hemorrhage, hydrocephalus, extra-axial collection or mass lesion/mass effect. Vascular: No hyperdense vessel or unexpected calcification. Skull: Signs of hyperostosis frontalis since the prior study. Scattered dural calcifications are unchanged. Opacification of left mastoid air cells, Ace stable finding. Right mastoid air cells are clear. Sinuses/Orbits: Visualized paranasal sinuses are clear. Other: None. IMPRESSION: No acute intracranial abnormality. Chronic left mastoid effusion. Electronically Signed   By: Zetta Bills M.D.   On: 07/31/2019 13:07   DG Chest Port 1 View  Result Date: 07/31/2019 CLINICAL DATA:  Chest pain EXAM: PORTABLE CHEST 1 VIEW COMPARISON:  November 17, 2016 FINDINGS: There is no edema or consolidation. There is cardiomegaly with pulmonary vascularity normal. No adenopathy. No bone lesions. No pneumothorax. IMPRESSION: Cardiomegaly.  No edema or consolidation. Electronically Signed   By: Lowella Grip III M.D.   On: 07/31/2019 12:13     Assessment & Plan:  Plan  I am having Rhonda Morrow maintain her multivitamin, Fiber, NONFORMULARY OR COMPOUNDED ITEM, azelastine, glucose blood, aspirin EC, albuterol, metaxalone, onetouch ultrasoft, meclizine, ranitidine, ondansetron, diclofenac sodium, mometasone-formoterol, montelukast, ipratropium-albuterol, fluticasone, AeroChamber MV, [place]-Con M20, cholestyramine, pantoprazole, B-12, triamcinolone cream, nystatin-triamcinolone,  amLODipine, hydrOXYzine, zolpidem, colchicine, metFORMIN, traMADol, fluticasone, cefdinir, benzonatate, budesonide-formoterol, ALPRAZolam, cyclobenzaprine, hydrochlorothiazide,  allopurinol, diclofenac, and cetirizine.  No orders of the defined types were placed in this encounter.   Problem List Items Addressed This Visit    None    Visit Diagnoses    RLQ abdominal pain    -  Primary   Relevant Orders   CT Abdomen Pelvis W Contrast   CBC with Differential/Platelet (Completed)   Comprehensive metabolic panel (Completed)          ? Hernia--- check CT        Check labs Go to er if symptoms persist   Follow-up: Return if symptoms worsen or fail to improve.  Ann Held, DO

## 2019-12-11 NOTE — Patient Instructions (Signed)
Abdominal Pain, Adult Pain in the abdomen (abdominal pain) can be caused by many things. Often, abdominal pain is not serious and it gets better with no treatment or by being treated at home. However, sometimes abdominal pain is serious. Your health care provider will ask questions about your medical history and do a physical exam to try to determine the cause of your abdominal pain. Follow these instructions at home:  Medicines  Take over-the-counter and prescription medicines only as told by your health care provider.  Do not take a laxative unless told by your health care provider. General instructions  Watch your condition for any changes.  Drink enough fluid to keep your urine pale yellow.  Keep all follow-up visits as told by your health care provider. This is important. Contact a health care provider if:  Your abdominal pain changes or gets worse.  You are not hungry or you lose weight without trying.  You are constipated or have diarrhea for more than 2-3 days.  You have pain when you urinate or have a bowel movement.  Your abdominal pain wakes you up at night.  Your pain gets worse with meals, after eating, or with certain foods.  You are vomiting and cannot keep anything down.  You have a fever.  You have blood in your urine. Get help right away if:  Your pain does not go away as soon as your health care provider told you to expect.  You cannot stop vomiting.  Your pain is only in areas of the abdomen, such as the right side or the left lower portion of the abdomen. Pain on the right side could be caused by appendicitis.  You have bloody or black stools, or stools that look like tar.  You have severe pain, cramping, or bloating in your abdomen.  You have signs of dehydration, such as: ? Dark urine, very little urine, or no urine. ? Cracked lips. ? Dry mouth. ? Sunken eyes. ? Sleepiness. ? Weakness.  You have trouble breathing or chest  pain. Summary  Often, abdominal pain is not serious and it gets better with no treatment or by being treated at home. However, sometimes abdominal pain is serious.  Watch your condition for any changes.  Take over-the-counter and prescription medicines only as told by your health care provider.  Contact a health care provider if your abdominal pain changes or gets worse.  Get help right away if you have severe pain, cramping, or bloating in your abdomen. This information is not intended to replace advice given to you by your health care provider. Make sure you discuss any questions you have with your health care provider. Document Revised: 03/03/2019 Document Reviewed: 03/03/2019 Elsevier Patient Education  2020 [employer] Inc.  

## 2019-12-15 ENCOUNTER — Encounter (HOSPITAL_BASED_OUTPATIENT_CLINIC_OR_DEPARTMENT_OTHER): Payer: Self-pay

## 2019-12-15 ENCOUNTER — Other Ambulatory Visit: Payer: Self-pay

## 2019-12-15 ENCOUNTER — Ambulatory Visit (HOSPITAL_BASED_OUTPATIENT_CLINIC_OR_DEPARTMENT_OTHER)
Admission: RE | Admit: 2019-12-15 | Discharge: 2019-12-15 | Disposition: A | Discharge status: Home / Self Care | Payer: Federal, State, Local not specified - PPO | Source: Ambulatory Visit | Attending: Family Medicine | Admitting: Family Medicine

## 2019-12-15 DIAGNOSIS — R1031 Right lower quadrant pain: Secondary | ICD-10-CM | POA: Insufficient documentation

## 2019-12-15 MED ORDER — IOHEXOL 300 MG/ML  SOLN
100.0000 mL | Freq: Once | INTRAMUSCULAR | Status: AC | PRN
  Administered 2019-12-15: 100 mL via INTRAVENOUS

## 2019-12-23 ENCOUNTER — Telehealth: Payer: Self-pay | Admitting: Family Medicine

## 2019-12-23 NOTE — Telephone Encounter (Signed)
Pt states she was looking or lab results. She also states she needs cough meds for a cough.

## 2019-12-24 NOTE — Telephone Encounter (Signed)
VM left with advice.

## 2019-12-24 NOTE — Telephone Encounter (Signed)
No answer when I tried calling. Please advise about cough

## 2019-12-24 NOTE — Telephone Encounter (Signed)
She can use mucinex or delsym otc for cough She may need virtual visit ---- if sob uc

## 2019-12-28 ENCOUNTER — Ambulatory Visit: Payer: Federal, State, Local not specified - PPO | Attending: Internal Medicine

## 2019-12-28 DIAGNOSIS — Z23 Encounter for immunization: Secondary | ICD-10-CM | POA: Insufficient documentation

## 2019-12-28 NOTE — Progress Notes (Signed)
   Covid-19 Vaccination Clinic  Name:  Rhonda Morrow    MRN: [id number] DOB: 1961-07-29  12/28/2019  [name] was observed post Covid-19 immunization for 15 minutes without incidence. She was provided with Vaccine Information Sheet and instruction to access the V-Safe system.   [name] was instructed to call 911 with any severe reactions post vaccine: Marland Kitchen Difficulty breathing  . Swelling of your face and throat  . A fast heartbeat  . A bad rash all over your body  . Dizziness and weakness    Immunizations Administered    Name Date Dose VIS Date Route   Pfizer COVID-19 Vaccine 12/28/2019  8:51 AM 0.3 mL 10/17/2019 Intramuscular   Manufacturer: [place]   Lot: [id number]   [place]: [id number]

## 2020-01-20 ENCOUNTER — Ambulatory Visit: Payer: Federal, State, Local not specified - PPO | Attending: Internal Medicine

## 2020-01-20 ENCOUNTER — Telehealth: Payer: Self-pay | Admitting: *Deleted

## 2020-01-20 DIAGNOSIS — Z23 Encounter for immunization: Secondary | ICD-10-CM

## 2020-01-20 NOTE — Telephone Encounter (Signed)
Left VM directing patient to go to the ED to be evaluated.

## 2020-01-20 NOTE — Progress Notes (Signed)
   Covid-19 Vaccination Clinic  Name:  Rhonda Morrow    MRN: [id number] DOB: [birth year]  01/20/2020  [name] was observed post Covid-19 immunization for 15 minutes without incident. She was provided with Vaccine Information Sheet and instruction to access the V-Safe system.   [name] was instructed to call 911 with any severe reactions post vaccine: Marland Kitchen Difficulty breathing  . Swelling of face and throat  . A fast heartbeat  . A bad rash all over body  . Dizziness and weakness   Immunizations Administered    Name Date Dose VIS Date Route   Pfizer COVID-19 Vaccine 01/20/2020  3:06 PM 0.3 mL 10/17/2019 Intramuscular   Manufacturer: [place]   Lot: [id number]   [place]: [id number]

## 2020-01-20 NOTE — Telephone Encounter (Signed)
Looks like patient never went to ED  Who Is Calling Patient / Member / Family / Caregiver Call Type Triage / Clinical Relationship To Patient Self Return Phone Number [phone] (Primary) Chief Complaint CHEST PAIN (>=21 years) - pain, pressure, heaviness or tightness Reason for Call Symptomatic / Request for [place] states that she is having chest pain on the left side. Translation No Nurse Assessment Nurse: Laurena Bering, RN, Helene Kelp Date/Time [name] Time): 01/19/2020 4:42:06 PM Confirm and document reason for call. If symptomatic, describe symptoms. ---Via interpreter, caller states that she is having chest pain on left side. In breast area. Had SOB yesterday. Used inhaler and subsided. Scheduled for COVID vaccine in AM.  01/19/2020 4:49:47 PM Go to ED Now (or PCP triage) Yes Laurena Bering, RN, [place]

## 2020-01-20 NOTE — Telephone Encounter (Signed)
Spoke with video relay and she had Mr. Lapiana on the phone who was questioning who had called from the office here.  I let video relay know our office had called to advise Rhonda Morrow to go to the ED.  He then got Rhonda Morrow in front of the monitor and she let video relay know she was feeling better today and had been resting and stated she had "taken something".  Pt told video relay she would go to the ED if she got worse.

## 2020-01-24 ENCOUNTER — Other Ambulatory Visit: Payer: Self-pay | Admitting: Family Medicine

## 2020-01-24 DIAGNOSIS — G8929 Other chronic pain: Secondary | ICD-10-CM

## 2020-01-24 DIAGNOSIS — M25511 Pain in right shoulder: Secondary | ICD-10-CM

## 2020-01-24 DIAGNOSIS — M62838 Other muscle spasm: Secondary | ICD-10-CM

## 2020-01-25 NOTE — Telephone Encounter (Signed)
Do you want to continue?

## 2020-02-08 ENCOUNTER — Other Ambulatory Visit: Payer: Self-pay | Admitting: Family Medicine

## 2020-02-08 DIAGNOSIS — M10079 Idiopathic gout, unspecified ankle and foot: Secondary | ICD-10-CM

## 2020-02-08 DIAGNOSIS — I10 Essential (primary) hypertension: Secondary | ICD-10-CM

## 2020-02-24 ENCOUNTER — Other Ambulatory Visit: Payer: Self-pay

## 2020-02-24 DIAGNOSIS — Z889 Allergy status to unspecified drugs, medicaments and biological substances status: Secondary | ICD-10-CM

## 2020-02-24 DIAGNOSIS — M62838 Other muscle spasm: Secondary | ICD-10-CM

## 2020-02-24 DIAGNOSIS — G8929 Other chronic pain: Secondary | ICD-10-CM

## 2020-02-24 DIAGNOSIS — I10 Essential (primary) hypertension: Secondary | ICD-10-CM

## 2020-02-25 ENCOUNTER — Other Ambulatory Visit: Payer: Self-pay

## 2020-02-25 DIAGNOSIS — IMO0002 Reserved for concepts with insufficient information to code with codable children: Secondary | ICD-10-CM

## 2020-02-25 DIAGNOSIS — Z889 Allergy status to unspecified drugs, medicaments and biological substances status: Secondary | ICD-10-CM

## 2020-02-25 DIAGNOSIS — E119 Type 2 diabetes mellitus without complications: Secondary | ICD-10-CM

## 2020-02-25 DIAGNOSIS — J301 Allergic rhinitis due to pollen: Secondary | ICD-10-CM

## 2020-02-25 DIAGNOSIS — I10 Essential (primary) hypertension: Secondary | ICD-10-CM

## 2020-02-25 DIAGNOSIS — G8929 Other chronic pain: Secondary | ICD-10-CM

## 2020-02-25 DIAGNOSIS — E1151 Type 2 diabetes mellitus with diabetic peripheral angiopathy without gangrene: Secondary | ICD-10-CM

## 2020-02-25 DIAGNOSIS — M62838 Other muscle spasm: Secondary | ICD-10-CM

## 2020-02-25 MED ORDER — FLUTICASONE PROPIONATE 50 MCG/ACT NA SUSP: NASAL | 4 refills | Status: DC

## 2020-02-25 MED ORDER — CYCLOBENZAPRINE HCL 10 MG PO TABS: ORAL_TABLET | ORAL | 2 refills | Status: DC

## 2020-02-25 MED ORDER — POTASSIUM CHLORIDE CRYS ER 20 MEQ PO TBCR: 20.0000 meq | EXTENDED_RELEASE_TABLET | Freq: Every day | ORAL | 1 refills | Status: DC

## 2020-02-25 MED ORDER — HYDROXYZINE HCL 10 MG PO TABS: ORAL_TABLET | ORAL | 1 refills | Status: DC

## 2020-02-25 MED ORDER — METFORMIN HCL ER 500 MG PO TB24: 500.0000 mg | ORAL_TABLET | Freq: Every day | ORAL | 1 refills | Status: DC

## 2020-02-25 MED ORDER — AMLODIPINE BESYLATE 5 MG PO TABS: 5.0000 mg | ORAL_TABLET | Freq: Every day | ORAL | 1 refills | Status: DC

## 2020-02-25 MED ORDER — HYDROCHLOROTHIAZIDE 12.5 MG PO TABS: 12.5000 mg | ORAL_TABLET | Freq: Every day | ORAL | 1 refills | Status: DC

## 2020-02-25 MED ORDER — BUDESONIDE-FORMOTEROL FUMARATE 160-4.5 MCG/ACT IN AERO: 2.0000 | INHALATION_SPRAY | Freq: Two times a day (BID) | RESPIRATORY_TRACT | 0 refills | Status: DC

## 2020-02-25 MED ORDER — GLUCOSE BLOOD VI STRP: ORAL_STRIP | 12 refills | Status: DC

## 2020-02-25 MED ORDER — ONETOUCH ULTRASOFT LANCETS MISC: 12 refills | Status: DC

## 2020-02-25 MED ORDER — CETIRIZINE HCL 10 MG PO TABS: ORAL_TABLET | ORAL | 1 refills | Status: DC

## 2020-02-25 MED ORDER — ALBUTEROL SULFATE HFA 108 (90 BASE) MCG/ACT IN AERS: 2.0000 | INHALATION_SPRAY | Freq: Four times a day (QID) | RESPIRATORY_TRACT | 5 refills | Status: DC | PRN

## 2020-03-03 ENCOUNTER — Other Ambulatory Visit: Payer: Self-pay

## 2020-03-03 ENCOUNTER — Encounter: Payer: Self-pay | Admitting: Family Medicine

## 2020-03-03 ENCOUNTER — Other Ambulatory Visit (HOSPITAL_COMMUNITY)
Admission: RE | Admit: 2020-03-03 | Discharge: 2020-03-03 | Disposition: A | Discharge status: Home / Self Care | Payer: Federal, State, Local not specified - PPO | Source: Ambulatory Visit | Attending: Family Medicine | Admitting: Family Medicine

## 2020-03-03 ENCOUNTER — Ambulatory Visit (INDEPENDENT_AMBULATORY_CARE_PROVIDER_SITE_OTHER): Payer: Federal, State, Local not specified - PPO | Admitting: Family Medicine

## 2020-03-03 VITALS — BP 115/74 | HR 84 | Resp 17 | Ht 62.0 in | Wt 230.0 lb

## 2020-03-03 DIAGNOSIS — Z124 Encounter for screening for malignant neoplasm of cervix: Secondary | ICD-10-CM | POA: Diagnosis not present

## 2020-03-03 DIAGNOSIS — R300 Dysuria: Secondary | ICD-10-CM

## 2020-03-03 DIAGNOSIS — E1140 Type 2 diabetes mellitus with diabetic neuropathy, unspecified: Secondary | ICD-10-CM

## 2020-03-03 DIAGNOSIS — N898 Other specified noninflammatory disorders of vagina: Secondary | ICD-10-CM

## 2020-03-03 LAB — POCT URINALYSIS DIP (MANUAL ENTRY)
Bilirubin, UA: NEGATIVE
Blood, UA: NEGATIVE
Glucose, UA: NEGATIVE mg/dL
Ketones, POC UA: NEGATIVE mg/dL
Nitrite, UA: NEGATIVE
Spec Grav, UA: 1.025 (ref 1.010–1.025)
Urobilinogen, UA: 0.2 E.U./dL
pH, UA: 6 (ref 5.0–8.0)

## 2020-03-03 LAB — BASIC METABOLIC PANEL
BUN: 14 mg/dL (ref 6–23)
CO2: 31 mEq/L (ref 19–32)
Calcium: 9 mg/dL (ref 8.4–10.5)
Chloride: 100 mEq/L (ref 96–112)
Creatinine, Ser: 0.86 mg/dL (ref 0.40–1.20)
GFR: 82.45 mL/min (ref >60.00)
Glucose, Bld: 156 mg/dL — ABNORMAL HIGH (ref 70–99)
Potassium: 3 mEq/L — ABNORMAL LOW (ref 3.5–5.1)
Sodium: 139 mEq/L (ref 135–145)

## 2020-03-03 LAB — HEMOGLOBIN A1C: Hgb A1c MFr Bld: 6.6 % — ABNORMAL HIGH (ref 4.6–6.5)

## 2020-03-03 MED ORDER — FLUCONAZOLE 150 MG PO TABS: 150.0000 mg | ORAL_TABLET | Freq: Once | ORAL | 0 refills | Status: AC

## 2020-03-03 NOTE — Progress Notes (Addendum)
[facility] [address] [place], [place] [address] [phone] [id number]  Date:  03/03/2020   Name:  Rhonda Morrow   DOB:  1961-10-30   MRN:  [id number]  PCP:  Ann Held, DO    Chief Complaint: Vaginal Itching (vaginal itching, one week, burning with urination, water seems to help)   History of Present Illness:  Rhonda Morrow is a 59 y.o. very pleasant female patient who presents with the following:  Patient who typically sees my partner Dr. Lerry Liner today with concern of vaginal itching History of hypertension, diabetes, asthma, deafness.  Visit today is accomplished with assistance of staff interpreter We met previously for a virtual visit in October when she was ill with COVID-19  Lab Results  Component Value Date   HGBA1C 6.4 05/23/2019   Can offer A1c today Could also do Pap during exam Urine microalbumin is due  She notes vaginal itching for about 2 weeks She may have some discomfort when she urinates- perhaps from the urine touching the skin No discharge noted  Menses- s/p menopause No fever or chills, but she may get hot flashes   Otherwise she is feeling well, no other particular concerns today    Patient Active Problem List   Diagnosis Date Noted  . Chronic right shoulder pain 08/11/2019  . Acute non-recurrent maxillary sinusitis 08/11/2019  . BMI 40.0-44.9, adult ([place]) 01/22/2019  . Right lower quadrant abdominal pain 10/20/2018  . Rectal pain 10/20/2018  . Vitamin B 12 deficiency 03/28/2018  . B12 deficiency 03/28/2018  . DM (diabetes mellitus) type II uncontrolled, periph vascular disorder ([place]) 03/28/2018  . Hyperlipidemia 03/28/2018  . Primary osteoarthritis of left hip 02/21/2017  . 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 History of laparoscopic cholecystectomy 06/20/2016  . Cerumen impaction 06/09/2016  . Hypersomnia 05/16/2016  . Knee pain, right 05/05/2016  .  RUQ pain 04/09/2016  . Abnormal CT scan 03/28/2016  . Dyspnea and respiratory abnormalities 03/15/2016  . Asthma with acute exacerbation 03/15/2016  . Asthma in adult 02/25/2016  . DM (diabetes mellitus) type II controlled, neurological manifestation ([place]) 02/09/2016  . Left hip pain 12/23/2015  . Right hamstring muscle strain 11/18/2015  . Abdominal pain, acute 09/01/2015  . Acute asthma exacerbation 04/16/2015  . Acute bronchitis 04/14/2015  . Vaginal discharge 09/14/2014  . Pap smear for cervical cancer screening 09/14/2014  . Scabies 05/06/2014  . Bed bug bite 05/06/2014  . Allergic rhinitis 04/09/2014  . Rectal itching 04/09/2014  . Bladder spasm 04/09/2014  . Knee pain, left 03/05/2014  . Hypokalemia 02/19/2014  . Nausea with vomiting 02/19/2014  . Diabetes mellitus, type II ([place]) 02/19/2014  . Edema 02/16/2014  . Disorder of rotator cuff 11/12/2013  . Routine general medical examination at a health care facility 08/20/2013  . GERD (gastroesophageal reflux disease) 06/26/2013  . Dysphagia, pharyngoesophageal phase 06/26/2013  . Suprapubic pain 05/12/2013  . Medication side effect 03/25/2013  . Diarrhea 03/25/2013  . Benign positional vertigo 01/30/2013  . Traumatic hematoma of thigh 01/15/2013  . HTN (hypertension) 09/02/2012  . Dry skin 08/22/2012  . External hemorrhoids 08/22/2012  . Obesity 06/30/2012  . Thyromegaly 05/22/2012  . Back pain 05/22/2012  . Elevated glucose 05/22/2012  . Moderate persistent asthma 04/19/2012  . Dyspnea 01/28/2012    Past Medical History:  Diagnosis Date  . Arthritis   . Asthma   . Complication of anesthesia  one time woke up and was vey scared,17 yrs ago  . Deaf   . Diabetes mellitus without complication ([place])   . GERD (gastroesophageal reflux disease)   . Hypertension   . Left groin pain   . Thyroid disease     Past Surgical History:  Procedure Laterality Date  . CARDIAC CATHETERIZATION    . CESAREAN SECTION    .  CHOLECYSTECTOMY N/A 06/20/2016   Procedure: LAPAROSCOPIC CHOLECYSTECTOMY;  Surgeon: Rolm Bookbinder, MD;  Location: [facility];  Service: General;  Laterality: N/A;  . ESOPHAGEAL MANOMETRY N/A 09/29/2013   Procedure: ESOPHAGEAL MANOMETRY (EM);  Surgeon: Milus Banister, MD;  Location: [facility];  Service: Endoscopy;  Laterality: N/A;  . TOTAL HIP ARTHROPLASTY Left 02/21/2017   Procedure: LEFT TOTAL HIP ARTHROPLASTY ANTERIOR APPROACH;  Surgeon: Leandrew Koyanagi, MD;  Location: [facility];  Service: Orthopedics;  Laterality: Left;    Social History   Tobacco Use  . Smoking status: Never Smoker  . Smokeless tobacco: Never Used  Substance Use Topics  . Alcohol use: No    Alcohol/week: 0.0 standard drinks  . Drug use: No    Family History  Problem Relation Age of Onset  . Diabetes Mother   . Heart disease Mother   . Diabetes Father     Allergies  Allergen Reactions  . Losartan Shortness Of Breath  . Oxycodone-Acetaminophen Nausea And Vomiting  . Augmentin [Amoxicillin-Pot Clavulanate] Diarrhea    Medication list has been reviewed and updated.  Current Outpatient Medications on File Prior to Visit  Medication Sig Dispense Refill  . albuterol (VENTOLIN HFA) 108 (90 Base) MCG/ACT inhaler Inhale 2 puffs into the lungs every 6 (six) hours as needed for wheezing. 6.7 g 5  . allopurinol (ZYLOPRIM) 100 MG tablet TAKE 1 TABLET BY MOUTH EVERY DAY 90 tablet 1  . ALPRAZolam (XANAX) 0.5 MG tablet 1 tab po 30 min prior to procedure and prn 10 tablet 0  . amLODipine (NORVASC) 5 MG tablet Take 1 tablet (5 mg total) by mouth daily. 90 tablet 1  . aspirin EC 325 MG tablet Take 1 tablet (325 mg total) by mouth 2 (two) times daily. 84 tablet 0  . azelastine (ASTELIN) 0.1 % nasal spray Place 2 sprays into both nostrils 2 (two) times daily. Use in each nostril as directed 30 mL 3  . benzonatate (TESSALON) 100 MG capsule Take 1 capsule (100 mg total) by mouth 3 (three) times daily as needed for cough. 30 capsule  0  . budesonide-formoterol (SYMBICORT) 160-4.5 MCG/ACT inhaler Inhale 2 puffs into the lungs every 12 (twelve) hours. 1 Inhaler 0  . cefdinir (OMNICEF) 300 MG capsule Take 300 mg by mouth 2 (two) times daily.    . cetirizine (ZYRTEC) 10 MG tablet TAKE 1 TABLET (10 MG TOTAL) BY MOUTH DAILY. 90 tablet 1  . colchicine 0.6 MG tablet Take 1 tablet (0.6 mg total) by mouth daily. 90 tablet 1  . Cyanocobalamin (B-12) 1000 MCG SUBL Dissolove 1 tablet under tongue once a day 90 each 1  . cyclobenzaprine (FLEXERIL) 10 MG tablet TAKE 1 TABLET BY MOUTH THREE TIMES A DAY AS NEEDED FOR MUSCLE SPASMS 30 tablet 2  . diclofenac (VOLTAREN) 75 MG EC tablet TAKE 1 TABLET (75 MG TOTAL) BY MOUTH 2 (TWO) TIMES DAILY AS NEEDED. 60 tablet 3  . diclofenac sodium (VOLTAREN) 1 % GEL Apply 4 g topically 4 (four) times daily as needed. 500 g 6  . Fiber CHEW Chew 1 tablet by  mouth daily.    . fluticasone (FLONASE) 50 MCG/ACT nasal spray Place 2 sprays into both nostrils daily. 16 g 6  . fluticasone (FLONASE) 50 MCG/ACT nasal spray PLACE 2 SPRAYS INTO BOTH NOSTRILS DAILY. 16 g 4  . glucose blood test strip Use as instructed--- one touch verio test strips 100 each 12  . hydrochlorothiazide (HYDRODIURIL) 12.5 MG tablet Take 1 tablet (12.5 mg total) by mouth daily. 90 tablet 1  . hydrOXYzine (ATARAX/VISTARIL) 10 MG tablet TAKE 1 TO 2 TABLETS BY MOUTH AT BEDTIME AS NEEDED FOR ITCHING 180 tablet 1  . ipratropium-albuterol (DUONEB) 0.5-2.5 (3) MG/3ML SOLN Take 3 mLs by nebulization every 6 (six) hours as needed. 360 mL 3  . Lancets (ONETOUCH ULTRASOFT) lancets Use as instructed 100 each 12  . meclizine (ANTIVERT) 12.5 MG tablet Take 1 tablet (12.5 mg total) by mouth 3 (three) times daily as needed for dizziness. 30 tablet 0  . metaxalone (SKELAXIN) 800 MG tablet TAKE 1 TABLET BY MOUTH THREE TIMES A DAY (Patient taking differently: Take 800 mg by mouth 3 (three) times daily. ) 30 tablet 2  . metFORMIN (GLUCOPHAGE-XR) 500 MG 24 hr tablet  Take 1 tablet (500 mg total) by mouth daily with breakfast. 90 tablet 1  . mometasone-formoterol (DULERA) 100-5 MCG/ACT AERO Inhale 2 puffs into the lungs 2 (two) times daily. 1 Inhaler PRN  . montelukast (SINGULAIR) 10 MG tablet Take 1 tablet (10 mg total) by mouth at bedtime. 90 tablet 3  . Multiple Vitamin (MULTIVITAMIN) tablet Take 1 tablet by mouth daily.    . NONFORMULARY OR COMPOUNDED ITEM Nebulizer   DX ASTHMA 1 each 0  . nystatin-triamcinolone (MYCOLOG II) cream Apply twice daily to lower chest area/upper abdomen 30 g 0  . ondansetron (ZOFRAN ODT) 4 MG disintegrating tablet Take 1 tablet (4 mg total) by mouth every 8 (eight) hours as needed for nausea or vomiting. 10 tablet 0  . pantoprazole (PROTONIX) 40 MG tablet Take 1 tablet (40 mg total) by mouth daily. 30 tablet 3  . potassium chloride SA (KLOR-CON M20) 20 MEQ tablet Take 1 tablet (20 mEq total) by mouth daily. 90 tablet 1  . ranitidine (ZANTAC) 150 MG tablet TAKE 1 TABLET BY MOUTH TWICE A DAY (Patient taking differently: Take 150 mg by mouth 2 (two) times daily. ) 180 tablet 1  . Spacer/Aero-Holding Chambers (AEROCHAMBER MV) inhaler Use as instructed 1 each 0  . traMADol (ULTRAM) 50 MG tablet TAKE 1 TABLET (50 MG TOTAL) BY MOUTH EVERY 8 (EIGHT) HOURS AS NEEDED FOR UP TO 5 DAYS. 16 tablet 0  . triamcinolone cream (KENALOG) 0.1 % Apply bid to hands 30 g 0  . zolpidem (AMBIEN) 10 MG tablet Take 1 tablet (10 mg total) by mouth at bedtime as needed. for sleep 15 tablet 1  . cholestyramine (QUESTRAN) 4 GM/DOSE powder Take 1 packet (4 g total) by mouth daily for 30 days. 30 packet 3   No current facility-administered medications on file prior to visit.    Review of Systems:  As per HPI- otherwise negative.   Physical Examination: Vitals:   03/03/20 1324  BP: 115/74  Pulse: 84  Resp: 17  SpO2: 99%   Vitals:   03/03/20 1324  Weight: 230 lb (104.3 kg)  Height: 5' 2"  (1.575 m)   Body mass index is 42.07 kg/m. Ideal Body  Weight: Weight in (lb) to have BMI = 25: 136.4  GEN: no acute distress.  Obese, otherwise looks well HEENT: Atraumatic,  Normocephalic.  Ears and Nose: No external deformity. CV: RRR, No M/G/R. No JVD. No thrill. No extra heart sounds. PULM: CTA B, no wheezes, crackles, rhonchi. No retractions. No resp. distress. No accessory muscle use. ABD: S, NT, ND, +BS. No rebound. No HSM. EXTR: No c/c/e PSYCH: Normally interactive. Conversant.  Vulva and vagina shows mild inflammation and some white discharge typical of yeast infection.  Otherwise no lesions, masses, no cervical tenderness   Assessment and Plan: Dysuria - Plan: POCT urinalysis dipstick, Urine Culture  Screening for cervical cancer - Plan: Cytology - PAP  Vaginal itching - Plan: Cervicovaginal ancillary only( [place]), fluconazole (DIFLUCAN) 150 MG tablet  Type 2 diabetes mellitus with diabetic neuropathy, without long-term current use of insulin ([facility]) - Plan: Microalbumin / creatinine urine ratio, Hemoglobin W2H, Basic metabolic panel   Here today with concern of vaginal itching and possible dysuria.  Suspect dysuria is due to urine irritating sensitive tissues.  We will treat with Diflucan, await the rest of her labs as above Check A1c today as she is due Moderate medical decision making today  It was very nice to see you today.  I think that you may have a vaginal yeast infection, we are going to treat you with a Diflucan oral tablet.  Please let me know if you are not feeling better in the next few days. Please avoid taking your colchicine for 72 hours after using Diflucan  I have also sent out your Pap test and a urine culture.  I will be in touch with these results as soon as possible-if you have a urinary tract infection, we will add an oral antibiotic  We will also go ahead and check your A1c for diabetes monitoring today  Please let me know if you are getting worse or have any other concerns   This visit occurred  during the SARS-CoV-2 public health emergency.  Safety protocols were in place, including screening questions prior to the visit, additional usage of staff PPE, and extensive cleaning of exam room while observing appropriate contact time as indicated for disinfecting solutions.    Signed Lamar Blinks, MD  Received labs so far, message to pt [id number]  Results for orders placed or performed in visit on 03/03/20  Hemoglobin A1c  Result Value Ref Range   Hgb A1c MFr Bld 6.6 (H) 4.6 - 6.5 %  Basic metabolic panel  Result Value Ref Range   Sodium 139 135 - 145 mEq/L   Potassium 3.0 (L) 3.5 - 5.1 mEq/L   Chloride 100 96 - 112 mEq/L   CO2 31 19 - 32 mEq/L   Glucose, Bld 156 (H) 70 - 99 mg/dL   BUN 14 6 - 23 mg/dL   Creatinine, Ser 0.86 0.40 - 1.20 mg/dL   GFR 82.45 >60.00 mL/min   Calcium 9.0 8.4 - 10.5 mg/dL  POCT urinalysis dipstick  Result Value Ref Range   Color, UA yellow yellow   Clarity, UA cloudy (A) clear   Glucose, UA negative negative mg/dL   Bilirubin, UA negative negative   Ketones, POC UA negative negative mg/dL   Spec Grav, UA 1.025 1.010 - 1.025   Blood, UA negative negative   pH, UA 6.0 5.0 - 8.0   Protein Ur, POC trace (A) negative mg/dL   Urobilinogen, UA 0.2 0.2 or 1.0 E.U./dL   Nitrite, UA Negative Negative   Leukocytes, UA Small (1+) (A) Negative

## 2020-03-03 NOTE — Patient Instructions (Addendum)
It was very nice to see you today.  I think that you may have a vaginal yeast infection, we are going to treat you with a Diflucan oral tablet.  Please let me know if you are not feeling better in the next few days. Please avoid taking your colchicine for 72 hours after using Diflucan  I have also sent out your Pap test and a urine culture.  I will be in touch with these results as soon as possible-if you have a urinary tract infection, we will add an oral antibiotic  We will also go ahead and check your A1c for diabetes monitoring today  Please let me know if you are getting worse or have any other concerns

## 2020-03-04 ENCOUNTER — Encounter: Payer: Self-pay | Admitting: Family Medicine

## 2020-03-04 LAB — CYTOLOGY - PAP
Chlamydia: NEGATIVE
Comment: NEGATIVE
Comment: NEGATIVE
Comment: NORMAL
Diagnosis: NEGATIVE
High risk HPV: NEGATIVE
Neisseria Gonorrhea: NEGATIVE

## 2020-03-04 LAB — CERVICOVAGINAL ANCILLARY ONLY
Bacterial Vaginitis (gardnerella): NEGATIVE
Candida Glabrata: NEGATIVE
Candida Vaginitis: POSITIVE — AB
Chlamydia: NEGATIVE
Comment: NEGATIVE
Comment: NEGATIVE
Comment: NEGATIVE
Comment: NEGATIVE
Comment: NEGATIVE
Comment: NORMAL
Neisseria Gonorrhea: NEGATIVE
Trichomonas: NEGATIVE

## 2020-03-04 LAB — URINE CULTURE
MICRO NUMBER:: 10415901
SPECIMEN QUALITY:: ADEQUATE

## 2020-03-04 LAB — MICROALBUMIN / CREATININE URINE RATIO
Creatinine,U: 176.2 mg/dL
Microalb Creat Ratio: 0.7 mg/g (ref 0.0–30.0)
Microalb, Ur: 1.3 mg/dL (ref 0.0–1.9)

## 2020-03-08 ENCOUNTER — Encounter: Payer: Self-pay | Admitting: Family Medicine

## 2020-03-08 ENCOUNTER — Other Ambulatory Visit: Payer: Self-pay

## 2020-03-08 MED ORDER — FLUCONAZOLE 150 MG PO TABS
150.0000 mg | ORAL_TABLET | Freq: Once | ORAL | 0 refills | Status: AC
Start: 2020-03-08 — End: 2020-03-08

## 2020-03-08 NOTE — Telephone Encounter (Signed)
Spoke with patient regarding results. Notified and verbalized understanding. She did ask if the diflucan could be sent in to [facility]. It doesn't look like it was sent in.   She did run our of potassium for a little but but just picked up a new rx and will begin to take it again.

## 2020-03-16 DIAGNOSIS — R5383 Other fatigue: Secondary | ICD-10-CM | POA: Diagnosis not present

## 2020-03-16 DIAGNOSIS — Z1231 Encounter for screening mammogram for malignant neoplasm of breast: Secondary | ICD-10-CM | POA: Diagnosis not present

## 2020-03-16 DIAGNOSIS — Z01419 Encounter for gynecological examination (general) (routine) without abnormal findings: Secondary | ICD-10-CM | POA: Diagnosis not present

## 2020-03-16 DIAGNOSIS — N760 Acute vaginitis: Secondary | ICD-10-CM | POA: Diagnosis not present

## 2020-03-16 DIAGNOSIS — E049 Nontoxic goiter, unspecified: Secondary | ICD-10-CM | POA: Diagnosis not present

## 2020-03-29 ENCOUNTER — Other Ambulatory Visit: Payer: Self-pay

## 2020-03-29 ENCOUNTER — Ambulatory Visit: Payer: Federal, State, Local not specified - PPO | Admitting: Family Medicine

## 2020-03-29 ENCOUNTER — Encounter: Payer: Self-pay | Admitting: Family Medicine

## 2020-03-29 VITALS — BP 108/88 | HR 88 | Temp 97.6°F | Resp 18 | Ht 62.0 in | Wt 228.4 lb

## 2020-03-29 DIAGNOSIS — R232 Flushing: Secondary | ICD-10-CM | POA: Diagnosis not present

## 2020-03-29 DIAGNOSIS — N951 Menopausal and female climacteric states: Secondary | ICD-10-CM | POA: Insufficient documentation

## 2020-03-29 MED ORDER — ESCITALOPRAM OXALATE 10 MG PO TABS: 10.0000 mg | ORAL_TABLET | Freq: Every day | ORAL | 2 refills | Status: DC

## 2020-03-29 MED ORDER — CALCIUM 1200 1200-1000 MG-UNIT PO CHEW: CHEWABLE_TABLET | ORAL | 11 refills | Status: DC

## 2020-03-29 NOTE — Patient Instructions (Signed)
Menopause Menopause is the normal time of life when menstrual periods stop completely. It is usually confirmed by 12 months without a menstrual period. The transition to menopause (perimenopause) most often happens between the ages of 45 and 55. During perimenopause, hormone levels change in your body, which can cause symptoms and affect your health. Menopause may increase your risk for:  Loss of bone (osteoporosis), which causes bone breaks (fractures).  Depression.  Hardening and narrowing of the arteries (atherosclerosis), which can cause heart attacks and strokes. What are the causes? This condition is usually caused by a natural change in hormone levels that happens as you get older. The condition may also be caused by surgery to remove both ovaries (bilateral oophorectomy). What increases the risk? This condition is more likely to start at an earlier age if you have certain medical conditions or treatments, including:  A tumor of the pituitary gland in the brain.  A disease that affects the ovaries and hormone production.  Radiation treatment for cancer.  Certain cancer treatments, such as chemotherapy or hormone (anti-estrogen) therapy.  Heavy smoking and excessive alcohol use.  Family history of early menopause. This condition is also more likely to develop earlier in women who are very thin. What are the signs or symptoms? Symptoms of this condition include:  Hot flashes.  Irregular menstrual periods.  Night sweats.  Changes in feelings about sex. This could be a decrease in sex drive or an increased comfort around your sexuality.  Vaginal dryness and thinning of the vaginal walls. This may cause painful intercourse.  Dryness of the skin and development of wrinkles.  Headaches.  Problems sleeping (insomnia).  Mood swings or irritability.  Memory problems.  Weight gain.  Hair growth on the face and chest.  Bladder infections or problems with urinating. How  is this diagnosed? This condition is diagnosed based on your medical history, a physical exam, your age, your menstrual history, and your symptoms. Hormone tests may also be done. How is this treated? In some cases, no treatment is needed. You and your health care provider should make a decision together about whether treatment is necessary. Treatment will be based on your individual condition and preferences. Treatment for this condition focuses on managing symptoms. Treatment may include:  Menopausal hormone therapy (MHT).  Medicines to treat specific symptoms or complications.  Acupuncture.  Vitamin or herbal supplements. Before starting treatment, make sure to let your health care provider know if you have a personal or family history of:  Heart disease.  Breast cancer.  Blood clots.  Diabetes.  Osteoporosis. Follow these instructions at home: Lifestyle  Do not use any products that contain nicotine or tobacco, such as cigarettes and e-cigarettes. If you need help quitting, ask your health care provider.  Get at least 30 minutes of physical activity on 5 or more days each week.  Avoid alcoholic and caffeinated beverages, as well as spicy foods. This may help prevent hot flashes.  Get 7-8 hours of sleep each night.  If you have hot flashes, try: ? Dressing in layers. ? Avoiding things that may trigger hot flashes, such as spicy food, warm places, or stress. ? Taking slow, deep breaths when a hot flash starts. ? Keeping a fan in your home and office.  Find ways to manage stress, such as deep breathing, meditation, or journaling.  Consider going to group therapy with other women who are having menopause symptoms. Ask your health care provider about recommended group therapy meetings. Eating and   drinking  Eat a healthy, balanced diet that contains whole grains, lean protein, low-fat dairy, and plenty of fruits and vegetables.  Your health care provider may recommend  adding more soy to your diet. Foods that contain soy include tofu, tempeh, and soy milk.  Eat plenty of foods that contain calcium and vitamin D for bone health. Items that are rich in calcium include low-fat milk, yogurt, beans, almonds, sardines, broccoli, and kale. Medicines  Take over-the-counter and prescription medicines only as told by your health care provider.  Talk with your health care provider before starting any herbal supplements. If prescribed, take vitamins and supplements as told by your health care provider. These may include: ? Calcium. Women age 51 and older should get 1,200 mg (milligrams) of calcium every day. ? Vitamin D. Women need 600-800 International Units of vitamin D each day. ? Vitamins B12 and B6. Aim for 50 micrograms of B12 and 1.5 mg of B6 each day. General instructions  Keep track of your menstrual periods, including: ? When they occur. ? How heavy they are and how long they last. ? How much time passes between periods.  Keep track of your symptoms, noting when they start, how often you have them, and how long they last.  Use vaginal lubricants or moisturizers to help with vaginal dryness and improve comfort during sex.  Keep all follow-up visits as told by your health care provider. This is important. This includes any group therapy or counseling. Contact a health care provider if:  You are still having menstrual periods after age 55.  You have pain during sex.  You have not had a period for 12 months and you develop vaginal bleeding. Get help right away if:  You have: ? Severe depression. ? Excessive vaginal bleeding. ? Pain when you urinate. ? A fast or irregular heart beat (palpitations). ? Severe headaches. ? Abdomen (abdominal) pain or severe indigestion.  You fell and you think you have a broken bone.  You develop leg or chest pain.  You develop vision problems.  You feel a lump in your breast. Summary  Menopause is the normal  time of life when menstrual periods stop completely. It is usually confirmed by 12 months without a menstrual period.  The transition to menopause (perimenopause) most often happens between the ages of 45 and 55.  Symptoms can be managed through medicines, lifestyle changes, and complementary therapies such as acupuncture.  Eat a balanced diet that is rich in nutrients to promote bone health and heart health and to manage symptoms during menopause. This information is not intended to replace advice given to you by your health care provider. Make sure you discuss any questions you have with your health care provider. Document Revised: 10/05/2017 Document Reviewed: 11/25/2016 Elsevier Patient Education  2020 [employer]  

## 2020-03-29 NOTE — Progress Notes (Signed)
Patient ID: Rhonda Morrow, female    DOB: 24-Aug-1961  Age: 59 y.o. MRN: [id number]    Subjective:  Subjective  HPI Rhonda Morrow presents for c/o hot flashes , trouble sleeping and irritability   For a while now.    Interpretor is present   Review of Systems  Constitutional: Negative for appetite change, diaphoresis, fatigue and unexpected weight change.  Eyes: Negative for pain, redness and visual disturbance.  Respiratory: Negative for cough, chest tightness, shortness of breath and wheezing.   Cardiovascular: Negative for chest pain, palpitations and leg swelling.  Endocrine: Negative for cold intolerance, heat intolerance, polydipsia, polyphagia and polyuria.  Genitourinary: Negative for difficulty urinating, dysuria and frequency.  Neurological: Negative for dizziness, light-headedness, numbness and headaches.  Psychiatric/Behavioral: Positive for decreased concentration, dysphoric mood and sleep disturbance. The patient is nervous/anxious.     History Past Medical History:  Diagnosis Date  . Arthritis   . Asthma   . Complication of anesthesia    one time woke up and was vey scared,17 yrs ago  . Deaf   . Diabetes mellitus without complication (Derma)   . GERD (gastroesophageal reflux disease)   . Hypertension   . Left groin pain   . Thyroid disease     She has a past surgical history that includes Cesarean section; Cardiac catheterization; Esophageal manometry (N/A, 09/29/2013); Cholecystectomy (N/A, 06/20/2016); and Total hip arthroplasty (Left, 02/21/2017).   Her family history includes Diabetes in her father and mother; Heart disease in her mother.She reports that she has never smoked. She has never used smokeless tobacco. She reports that she does not drink alcohol or use drugs.  Current Outpatient Medications on File Prior to Visit  Medication Sig Dispense Refill  . albuterol (VENTOLIN HFA) 108 (90 Base) MCG/ACT inhaler Inhale 2 puffs into the lungs every 6 (six) hours as  needed for wheezing. 6.7 g 5  . allopurinol (ZYLOPRIM) 100 MG tablet TAKE 1 TABLET BY MOUTH EVERY DAY 90 tablet 1  . ALPRAZolam (XANAX) 0.5 MG tablet 1 tab po 30 min prior to procedure and prn 10 tablet 0  . amLODipine (NORVASC) 5 MG tablet Take 1 tablet (5 mg total) by mouth daily. 90 tablet 1  . aspirin EC 325 MG tablet Take 1 tablet (325 mg total) by mouth 2 (two) times daily. 84 tablet 0  . azelastine (ASTELIN) 0.1 % nasal spray Place 2 sprays into both nostrils 2 (two) times daily. Use in each nostril as directed 30 mL 3  . budesonide-formoterol (SYMBICORT) 160-4.5 MCG/ACT inhaler Inhale 2 puffs into the lungs every 12 (twelve) hours. 1 Inhaler 0  . cetirizine (ZYRTEC) 10 MG tablet TAKE 1 TABLET (10 MG TOTAL) BY MOUTH DAILY. 90 tablet 1  . colchicine 0.6 MG tablet Take 1 tablet (0.6 mg total) by mouth daily. 90 tablet 1  . Cyanocobalamin (B-12) 1000 MCG SUBL Dissolove 1 tablet under tongue once a day 90 each 1  . cyclobenzaprine (FLEXERIL) 10 MG tablet TAKE 1 TABLET BY MOUTH THREE TIMES A DAY AS NEEDED FOR MUSCLE SPASMS 30 tablet 2  . diclofenac (VOLTAREN) 75 MG EC tablet TAKE 1 TABLET (75 MG TOTAL) BY MOUTH 2 (TWO) TIMES DAILY AS NEEDED. 60 tablet 3  . diclofenac sodium (VOLTAREN) 1 % GEL Apply 4 g topically 4 (four) times daily as needed. 500 g 6  . Fiber CHEW Chew 1 tablet by mouth daily.    . fluticasone (FLONASE) 50 MCG/ACT nasal spray Place 2 sprays into both  nostrils daily. 16 g 6  . fluticasone (FLONASE) 50 MCG/ACT nasal spray PLACE 2 SPRAYS INTO BOTH NOSTRILS DAILY. 16 g 4  . glucose blood test strip Use as instructed--- one touch verio test strips 100 each 12  . hydrochlorothiazide (HYDRODIURIL) 12.5 MG tablet Take 1 tablet (12.5 mg total) by mouth daily. 90 tablet 1  . hydrOXYzine (ATARAX/VISTARIL) 10 MG tablet TAKE 1 TO 2 TABLETS BY MOUTH AT BEDTIME AS NEEDED FOR ITCHING 180 tablet 1  . ipratropium-albuterol (DUONEB) 0.5-2.5 (3) MG/3ML SOLN Take 3 mLs by nebulization every 6 (six)  hours as needed. 360 mL 3  . Lancets (ONETOUCH ULTRASOFT) lancets Use as instructed 100 each 12  . meclizine (ANTIVERT) 12.5 MG tablet Take 1 tablet (12.5 mg total) by mouth 3 (three) times daily as needed for dizziness. 30 tablet 0  . metaxalone (SKELAXIN) 800 MG tablet TAKE 1 TABLET BY MOUTH THREE TIMES A DAY (Patient taking differently: Take 800 mg by mouth 3 (three) times daily. ) 30 tablet 2  . metFORMIN (GLUCOPHAGE-XR) 500 MG 24 hr tablet Take 1 tablet (500 mg total) by mouth daily with breakfast. 90 tablet 1  . mometasone-formoterol (DULERA) 100-5 MCG/ACT AERO Inhale 2 puffs into the lungs 2 (two) times daily. 1 Inhaler PRN  . montelukast (SINGULAIR) 10 MG tablet Take 1 tablet (10 mg total) by mouth at bedtime. 90 tablet 3  . Multiple Vitamin (MULTIVITAMIN) tablet Take 1 tablet by mouth daily.    . NONFORMULARY OR COMPOUNDED ITEM Nebulizer   DX ASTHMA 1 each 0  . nystatin-triamcinolone (MYCOLOG II) cream Apply twice daily to lower chest area/upper abdomen 30 g 0  . ondansetron (ZOFRAN ODT) 4 MG disintegrating tablet Take 1 tablet (4 mg total) by mouth every 8 (eight) hours as needed for nausea or vomiting. 10 tablet 0  . pantoprazole (PROTONIX) 40 MG tablet Take 1 tablet (40 mg total) by mouth daily. 30 tablet 3  . potassium chloride SA (KLOR-CON M20) 20 MEQ tablet Take 1 tablet (20 mEq total) by mouth daily. 90 tablet 1  . ranitidine (ZANTAC) 150 MG tablet TAKE 1 TABLET BY MOUTH TWICE A DAY (Patient taking differently: Take 150 mg by mouth 2 (two) times daily. ) 180 tablet 1  . Spacer/Aero-Holding Chambers (AEROCHAMBER MV) inhaler Use as instructed 1 each 0  . traMADol (ULTRAM) 50 MG tablet TAKE 1 TABLET (50 MG TOTAL) BY MOUTH EVERY 8 (EIGHT) HOURS AS NEEDED FOR UP TO 5 DAYS. 16 tablet 0  . triamcinolone cream (KENALOG) 0.1 % Apply bid to hands 30 g 0  . benzonatate (TESSALON) 100 MG capsule Take 1 capsule (100 mg total) by mouth 3 (three) times daily as needed for cough. (Patient not  taking: Reported on 03/29/2020) 30 capsule 0  . cefdinir (OMNICEF) 300 MG capsule Take 300 mg by mouth 2 (two) times daily.    . cholestyramine (QUESTRAN) 4 GM/DOSE powder Take 1 packet (4 g total) by mouth daily for 30 days. 30 packet 3  . zolpidem (AMBIEN) 10 MG tablet Take 1 tablet (10 mg total) by mouth at bedtime as needed. for sleep (Patient not taking: Reported on 03/29/2020) 15 tablet 1   No current facility-administered medications on file prior to visit.     Objective:  Objective  Physical Exam Vitals and nursing note reviewed.  Constitutional:      Appearance: She is well-developed.  HENT:     Head: Normocephalic and atraumatic.     Right Ear: Tympanic membrane normal.  Eyes:     Conjunctiva/sclera: Conjunctivae normal.  Neck:     Thyroid: No thyromegaly.     Vascular: No carotid bruit or JVD.  Cardiovascular:     Rate and Rhythm: Normal rate and regular rhythm.     Heart sounds: Normal heart sounds. No murmur.  Pulmonary:     Effort: Pulmonary effort is normal. No respiratory distress.     Breath sounds: Normal breath sounds. No wheezing or rales.  Chest:     Chest wall: No tenderness.  Musculoskeletal:     Cervical back: Normal range of motion and neck supple.  Neurological:     Mental Status: She is alert and oriented to person, place, and time.  Psychiatric:        Mood and Affect: Mood normal.        Behavior: Behavior normal.        Thought Content: Thought content normal.        Cognition and Memory: Cognition normal.        Judgment: Judgment normal.     Comments: Pt c/o trouble sleeping due to hot flashes     BP 108/88 (BP Location: Right Arm, Patient Position: Sitting, Cuff Size: Large)   Pulse 88   Temp 97.6 F (36.4 C) (Temporal)   Resp 18   Ht 5\' 2"  (1.575 m)   Wt 228 lb 6.4 oz (103.6 kg)   LMP 05/20/2012   SpO2 97%   BMI 41.77 kg/m  Wt Readings from Last 3 Encounters:  03/29/20 228 lb 6.4 oz (103.6 kg)  03/03/20 230 lb (104.3 kg)    12/11/19 237 lb 6.4 oz (107.7 kg)     Lab Results  Component Value Date   WBC 5.3 12/11/2019   HGB 14.5 12/11/2019   HCT 44.9 12/11/2019   PLT 149.0 (L) 12/11/2019   GLUCOSE 156 (H) 03/03/2020   CHOL 142 05/23/2019   TRIG 79.0 05/23/2019   HDL 54.00 05/23/2019   LDLCALC 72 05/23/2019   ALT 30 12/11/2019   AST 24 12/11/2019   NA 139 03/03/2020   K 3.0 (L) 03/03/2020   CL 100 03/03/2020   CREATININE 0.86 03/03/2020   BUN 14 03/03/2020   CO2 31 03/03/2020   TSH 1.36 05/14/2019   INR 1.05 02/12/2017   HGBA1C 6.6 (H) 03/03/2020   MICROALBUR 1.3 03/03/2020    No results found.   Assessment & Plan:  Plan  I am having Rhonda Morrow start on Calcium 1200 and escitalopram. I am also having her maintain her multivitamin, Fiber, NONFORMULARY OR COMPOUNDED ITEM, azelastine, aspirin EC, metaxalone, meclizine, ranitidine, ondansetron, diclofenac sodium, mometasone-formoterol, montelukast, ipratropium-albuterol, AeroChamber MV, cholestyramine, pantoprazole, B-12, triamcinolone cream, nystatin-triamcinolone, zolpidem, colchicine, traMADol, fluticasone, cefdinir, benzonatate, ALPRAZolam, diclofenac, allopurinol, albuterol, hydrOXYzine, fluticasone, cetirizine, cyclobenzaprine, amLODipine, hydrochlorothiazide, potassium chloride SA, metFORMIN, onetouch ultrasoft, glucose blood, and budesonide-formoterol.  Meds ordered this encounter  Medications  . Calcium Carbonate-Vit D-Min (CALCIUM 1200) 1200-1000 MG-UNIT CHEW    Sig: 1 po qd    Dispense:  30 tablet    Refill:  11  . escitalopram (LEXAPRO) 10 MG tablet    Sig: Take 1 tablet (10 mg total) by mouth at bedtime.    Dispense:  30 tablet    Refill:  2    Problem List Items Addressed This Visit      Unprioritized   Hot flashes - Primary   Relevant Medications   escitalopram (LEXAPRO) 10 MG tablet   Other Relevant Orders   Thyroid  Panel With TSH   CBC with Differential/Platelet   Comprehensive metabolic panel   Menopausal symptoms     lexapro to help with irritability and hot flashes  Hope it helps with sleep as well  F/u 1 month or sooner prn          Follow-up: Return in about 4 weeks (around 04/26/2020), or if symptoms worsen or fail to improve, for f/u hot flashes / anxiety and sleep.  Ann Held, DO

## 2020-03-29 NOTE — Assessment & Plan Note (Signed)
lexapro to help with irritability and hot flashes  Hope it helps with sleep as well  F/u 1 month or sooner prn

## 2020-04-01 ENCOUNTER — Encounter: Payer: Self-pay | Admitting: Pulmonary Disease

## 2020-04-01 ENCOUNTER — Other Ambulatory Visit: Payer: Self-pay

## 2020-04-01 ENCOUNTER — Encounter: Payer: Self-pay | Admitting: *Deleted

## 2020-04-01 ENCOUNTER — Ambulatory Visit: Payer: Federal, State, Local not specified - PPO | Admitting: Pulmonary Disease

## 2020-04-01 VITALS — BP 132/82 | HR 65 | Wt 225.0 lb

## 2020-04-01 DIAGNOSIS — R059 Cough, unspecified: Secondary | ICD-10-CM

## 2020-04-01 DIAGNOSIS — R05 Cough: Secondary | ICD-10-CM | POA: Diagnosis not present

## 2020-04-01 DIAGNOSIS — J4540 Moderate persistent asthma, uncomplicated: Secondary | ICD-10-CM | POA: Diagnosis not present

## 2020-04-01 DIAGNOSIS — Z79899 Other long term (current) drug therapy: Secondary | ICD-10-CM

## 2020-04-01 MED ORDER — SYMBICORT 160-4.5 MCG/ACT IN AERO: 2.0000 | INHALATION_SPRAY | Freq: Two times a day (BID) | RESPIRATORY_TRACT | 5 refills | Status: DC

## 2020-04-01 MED ORDER — VENTOLIN HFA 108 (90 BASE) MCG/ACT IN AERS: 2.0000 | INHALATION_SPRAY | Freq: Four times a day (QID) | RESPIRATORY_TRACT | 5 refills | Status: DC | PRN

## 2020-04-01 MED ORDER — BENZONATATE 200 MG PO CAPS: 200.0000 mg | ORAL_CAPSULE | Freq: Three times a day (TID) | ORAL | 1 refills | Status: DC | PRN

## 2020-04-01 NOTE — Patient Instructions (Addendum)
You were seen today by Lauraine Rinne, NP  for:   1. Cough  - benzonatate (TESSALON) 200 MG capsule; Take 1 capsule (200 mg total) by mouth 3 (three) times daily as needed for cough.  Dispense: 30 capsule; Refill: 1    2. Moderate persistent asthma without complication  Restart Symbicort 160 >>> 2 puffs in the morning right when you wake up, rinse out your mouth after use, 12 hours later 2 puffs, rinse after use >>> Take this daily, no matter what >>> This is not a rescue inhaler   Only use your albuterol as a rescue medication to be used if you can't catch your breath by resting or doing a relaxed purse lip breathing pattern.  - The less you use it, the better it will work when you need it. - Ok to use up to 2 puffs  every 4 hours if you must but call for immediate appointment if use goes up over your usual need - Don't leave home without it !!  (think of it like the spare tire for your car)    3. Medication management  Please present to our office in 2 - 8 weeks for an appointment with the clinical pharmacy team for:  Marland Kitchen Medication Management  . Medication reconciliation  . Medication Access  . Inhaler teaching     We recommend today:   Meds ordered this encounter  Medications  . benzonatate (TESSALON) 200 MG capsule    Sig: Take 1 capsule (200 mg total) by mouth 3 (three) times daily as needed for cough.    Dispense:  30 capsule    Refill:  1  . SYMBICORT 160-4.5 MCG/ACT inhaler    Sig: Inhale 2 puffs into the lungs every 12 (twelve) hours.    Dispense:  1 Inhaler    Refill:  5    Order Specific Question:   Lot Number?    Answer:   [id number]    Order Specific Question:   Expiration Date?    Answer:   11/27/2019    Order Specific Question:   Manufacturer?    Answer:   [employer] [[id number]]    Order Specific Question:   Quantity    Answer:   1  . VENTOLIN HFA 108 (90 Base) MCG/ACT inhaler    Sig: Inhale 2 puffs into the lungs every 6 (six) hours as needed for wheezing or  shortness of breath.    Dispense:  18 g    Refill:  5    Follow Up:    Return in about 2 months (around 06/01/2020), or if symptoms worsen or fail to improve, for Follow up with Dr. Valeta Harms, Follow up with Wyn Quaker FNP-C.   Please do your part to reduce the spread of COVID-19:      Reduce your risk of any infection  and COVID19 by using the similar precautions used for avoiding the common cold or flu:  Marland Kitchen Wash your hands often with soap and warm water for at least 20 seconds.  If soap and water are not readily available, use an alcohol-based hand sanitizer with at least 60% alcohol.  . If coughing or sneezing, cover your mouth and nose by coughing or sneezing into the elbow areas of your shirt or coat, into a tissue or into your sleeve (not your hands). Langley Gauss A MASK when in public  . Avoid shaking hands with others and consider head nods or verbal greetings only. . Avoid touching your eyes, nose,  or mouth with unwashed hands.  . Avoid close contact with people who are sick. . Avoid places or events with large numbers of people in one location, like concerts or sporting events. . If you have some symptoms but not all symptoms, continue to monitor at home and seek medical attention if your symptoms worsen. . If you are having a medical emergency, call 911.   [place] / e-Visit: [email or web address]         [facility]: [facility]: [id number]                   [facility]: [id number]     It is flu season:   >>> Best ways to protect herself from the flu: Receive the yearly flu vaccine, practice good hand hygiene washing with soap and also using hand sanitizer when available, eat a nutritious meals, get adequate rest, hydrate appropriately   Please contact the office if your symptoms worsen or you have concerns that you are  not improving.   Thank you for choosing [facility] for your healthcare, and for allowing [place] to partner with you on your healthcare journey. I am thankful to be able to provide care to you today.   Wyn Quaker FNP-C

## 2020-04-01 NOTE — Progress Notes (Signed)
@Patient  ID: Rhonda Morrow, female    DOB: 12-23-1960, 59 y.o.   MRN: [id number]  Chief Complaint  Patient presents with  . Follow-up    Reports increased shortness of breath, coughing.    Referring provider: Ann Held, *  HPI:  59 year old female never smoker followed in our office for asthma  PMH: Obesity, hypertension, GERD, dysphagia, edema, hypokalemia, type 2 diabetes, hyperlipidemia Smoker/ Smoking History: Never Smoker  Maintenance:  Symbicort 160 Pt of: Dr. Valeta Harms  04/01/2020  - Visit   59 year old female never smoker followed in our office for asthma she is followed by Dr. Valeta Harms.  Patient was last seen in March/2020 by [place] NP.  Patient is deaf presenting today with an interpreter.  Patient reporting she has had increased shortness of breath and coughing episodes over the last 2 weeks.  Patient specifically noticed a worsening cough over the last 24 hours.  She reports that she has not been maintained on her Symbicort maintenance inhaler for some time now.  She is unsure why.  She reports that her rescue inhaler was switched from Ventolin HFA to generic albuterol HFA by her pharmacy.  She feels that she has an intolerance to the generic albuterol.  She feels that this causes her to have a worsened cough.  Patient is not currently started to use any sort of over-the-counter measures for management of her cough.  We will review this today.  Tests:   FENO:  No results found for: NITRICOXIDE  PFT: PFT Results Latest Ref Rng & Units 01/22/2019 07/21/2013  FVC-Pre L 1.60 -  FVC-Predicted Pre % 61 56  FVC-Post L 1.66 -  FVC-Predicted Post % 63 -  Pre FEV1/FVC % % 89 89  Post FEV1/FCV % % 88 -  FEV1-Pre L 1.42 1.37  FEV1-Predicted Pre % 69 62  FEV1-Post L 1.45 -  DLCO UNC% % 91 -  DLCO COR %Predicted % 148 -  TLC L 3.13 -  TLC % Predicted % 65 -  RV % Predicted % 65 -    WALK:  SIX MIN WALK 01/17/2012  Supplimental Oxygen during Test? (L/min) No     Imaging: No results found.  Lab Results:  CBC    Component Value Date/Time   WBC 5.3 12/11/2019 1430   RBC 5.21 (H) 12/11/2019 1430   HGB 14.5 12/11/2019 1430   HCT 44.9 12/11/2019 1430   PLT 149.0 (L) 12/11/2019 1430   MCV 86.2 12/11/2019 1430   MCH 27.6 07/31/2019 1234   MCHC 32.2 12/11/2019 1430   RDW 14.3 12/11/2019 1430   LYMPHSABS 2.1 12/11/2019 1430   MONOABS 0.5 12/11/2019 1430   EOSABS 0.1 12/11/2019 1430   BASOSABS 0.0 12/11/2019 1430    BMET    Component Value Date/Time   NA 139 03/03/2020 1416   K 3.0 (L) 03/03/2020 1416   CL 100 03/03/2020 1416   CO2 31 03/03/2020 1416   GLUCOSE 156 (H) 03/03/2020 1416   BUN 14 03/03/2020 1416   CREATININE 0.86 03/03/2020 1416   CREATININE 0.78 03/15/2018 1702   CALCIUM 9.0 03/03/2020 1416   GFRNONAA >60 07/31/2019 1234   GFRAA >60 07/31/2019 1234    BNP    Component Value Date/Time   BNP 25.3 09/01/2015 1000    ProBNP    Component Value Date/Time   PROBNP 21.0 10/10/2016 1111    Specialty Problems      Pulmonary Problems   Dyspnea    04/2016 2D echo >  nml EF , no wall motion abn.       Moderate persistent asthma    Positive methacholine challenge May 2013      Allergic rhinitis   Acute bronchitis   Acute asthma exacerbation   Asthma in adult   Asthma with acute exacerbation   Dyspnea and respiratory abnormalities   Acute non-recurrent maxillary sinusitis   Cough   Moderate persistent asthma without complication      Allergies  Allergen Reactions  . Losartan Shortness Of Breath  . Oxycodone-Acetaminophen Nausea And Vomiting  . Augmentin [Amoxicillin-Pot Clavulanate] Diarrhea    Immunization History  Administered Date(s) Administered  . DT (Pediatric) 04/07/2011  . Influenza Split 08/07/2011, 08/05/2012, 08/22/2012, 08/31/2014  . Influenza,inj,Quad PF,6+ Mos 08/20/2013, 10/22/2015, 08/01/2016, 09/11/2017, 08/06/2018, 07/15/2019  . PFIZER SARS-COV-2 Vaccination 12/28/2019, 01/20/2020   . Pneumococcal Polysaccharide-23 10/02/2012  . Tdap 12/18/2011    Past Medical History:  Diagnosis Date  . Arthritis   . Asthma   . Complication of anesthesia    one time woke up and was vey scared,17 yrs ago  . Deaf   . Diabetes mellitus without complication ([place])   . GERD (gastroesophageal reflux disease)   . Hypertension   . Left groin pain   . Thyroid disease     Tobacco History: Social History   Tobacco Use  Smoking Status Never Smoker  Smokeless Tobacco Never Used   Counseling given: Not Answered   Continue to not smoke  Outpatient Encounter Medications as of 04/01/2020  Medication Sig  . allopurinol (ZYLOPRIM) 100 MG tablet TAKE 1 TABLET BY MOUTH EVERY DAY  . ALPRAZolam (XANAX) 0.5 MG tablet 1 tab po 30 min prior to procedure and prn  . amLODipine (NORVASC) 5 MG tablet Take 1 tablet (5 mg total) by mouth daily.  Marland Kitchen aspirin EC 325 MG tablet Take 1 tablet (325 mg total) by mouth 2 (two) times daily.  Marland Kitchen azelastine (ASTELIN) 0.1 % nasal spray Place 2 sprays into both nostrils 2 (two) times daily. Use in each nostril as directed  . Calcium Carbonate-Vit D-Min (CALCIUM 1200) 1200-1000 MG-UNIT CHEW 1 po qd  . cefdinir (OMNICEF) 300 MG capsule Take 300 mg by mouth 2 (two) times daily.  . cetirizine (ZYRTEC) 10 MG tablet TAKE 1 TABLET (10 MG TOTAL) BY MOUTH DAILY.  Marland Kitchen colchicine 0.6 MG tablet Take 1 tablet (0.6 mg total) by mouth daily.  . Cyanocobalamin (B-12) 1000 MCG SUBL Dissolove 1 tablet under tongue once a day  . cyclobenzaprine (FLEXERIL) 10 MG tablet TAKE 1 TABLET BY MOUTH THREE TIMES A DAY AS NEEDED FOR MUSCLE SPASMS  . diclofenac (VOLTAREN) 75 MG EC tablet TAKE 1 TABLET (75 MG TOTAL) BY MOUTH 2 (TWO) TIMES DAILY AS NEEDED.  Marland Kitchen diclofenac sodium (VOLTAREN) 1 % GEL Apply 4 g topically 4 (four) times daily as needed.  Marland Kitchen escitalopram (LEXAPRO) 10 MG tablet Take 1 tablet (10 mg total) by mouth at bedtime.  . Fiber CHEW Chew 1 tablet by mouth daily.  . fluticasone  (FLONASE) 50 MCG/ACT nasal spray Place 2 sprays into both nostrils daily.  . fluticasone (FLONASE) 50 MCG/ACT nasal spray PLACE 2 SPRAYS INTO BOTH NOSTRILS DAILY.  Marland Kitchen glucose blood test strip Use as instructed--- one touch verio test strips  . hydrochlorothiazide (HYDRODIURIL) 12.5 MG tablet Take 1 tablet (12.5 mg total) by mouth daily.  . hydrOXYzine (ATARAX/VISTARIL) 10 MG tablet TAKE 1 TO 2 TABLETS BY MOUTH AT BEDTIME AS NEEDED FOR ITCHING  . ipratropium-albuterol (DUONEB)  0.5-2.5 (3) MG/3ML SOLN Take 3 mLs by nebulization every 6 (six) hours as needed.  . Lancets (ONETOUCH ULTRASOFT) lancets Use as instructed  . meclizine (ANTIVERT) 12.5 MG tablet Take 1 tablet (12.5 mg total) by mouth 3 (three) times daily as needed for dizziness.  . metaxalone (SKELAXIN) 800 MG tablet TAKE 1 TABLET BY MOUTH THREE TIMES A DAY (Patient taking differently: Take 800 mg by mouth 3 (three) times daily. )  . metFORMIN (GLUCOPHAGE-XR) 500 MG 24 hr tablet Take 1 tablet (500 mg total) by mouth daily with breakfast.  . mometasone-formoterol (DULERA) 100-5 MCG/ACT AERO Inhale 2 puffs into the lungs 2 (two) times daily.  . montelukast (SINGULAIR) 10 MG tablet Take 1 tablet (10 mg total) by mouth at bedtime.  . Multiple Vitamin (MULTIVITAMIN) tablet Take 1 tablet by mouth daily.  . NONFORMULARY OR COMPOUNDED ITEM Nebulizer   DX ASTHMA  . nystatin-triamcinolone (MYCOLOG II) cream Apply twice daily to lower chest area/upper abdomen  . ondansetron (ZOFRAN ODT) 4 MG disintegrating tablet Take 1 tablet (4 mg total) by mouth every 8 (eight) hours as needed for nausea or vomiting.  . pantoprazole (PROTONIX) 40 MG tablet Take 1 tablet (40 mg total) by mouth daily.  . potassium chloride SA (KLOR-CON M20) 20 MEQ tablet Take 1 tablet (20 mEq total) by mouth daily.  . ranitidine (ZANTAC) 150 MG tablet TAKE 1 TABLET BY MOUTH TWICE A DAY (Patient taking differently: Take 150 mg by mouth 2 (two) times daily. )  . Spacer/Aero-Holding  Chambers (AEROCHAMBER MV) inhaler Use as instructed  . SYMBICORT 160-4.5 MCG/ACT inhaler Inhale 2 puffs into the lungs every 12 (twelve) hours.  . traMADol (ULTRAM) 50 MG tablet TAKE 1 TABLET (50 MG TOTAL) BY MOUTH EVERY 8 (EIGHT) HOURS AS NEEDED FOR UP TO 5 DAYS.  Marland Kitchen triamcinolone cream (KENALOG) 0.1 % Apply bid to hands  . [DISCONTINUED] albuterol (VENTOLIN HFA) 108 (90 Base) MCG/ACT inhaler Inhale 2 puffs into the lungs every 6 (six) hours as needed for wheezing.  . [DISCONTINUED] budesonide-formoterol (SYMBICORT) 160-4.5 MCG/ACT inhaler Inhale 2 puffs into the lungs every 12 (twelve) hours.  . benzonatate (TESSALON) 200 MG capsule Take 1 capsule (200 mg total) by mouth 3 (three) times daily as needed for cough.  . VENTOLIN HFA 108 (90 Base) MCG/ACT inhaler Inhale 2 puffs into the lungs every 6 (six) hours as needed for wheezing or shortness of breath.  . [DISCONTINUED] benzonatate (TESSALON) 100 MG capsule Take 1 capsule (100 mg total) by mouth 3 (three) times daily as needed for cough. (Patient not taking: Reported on 03/29/2020)  . [DISCONTINUED] cholestyramine (QUESTRAN) 4 GM/DOSE powder Take 1 packet (4 g total) by mouth daily for 30 days.  . [DISCONTINUED] zolpidem (AMBIEN) 10 MG tablet Take 1 tablet (10 mg total) by mouth at bedtime as needed. for sleep (Patient not taking: Reported on 03/29/2020)   No facility-administered encounter medications on file as of 04/01/2020.     Review of Systems  Review of Systems  Constitutional: Positive for fatigue. Negative for activity change and fever.  HENT: Negative for sinus pressure, sinus pain and sore throat.   Respiratory: Positive for cough, shortness of breath and wheezing.   Cardiovascular: Negative for chest pain and palpitations.  Gastrointestinal: Negative for diarrhea, nausea and vomiting.  Musculoskeletal: Negative for arthralgias.  Neurological: Negative for dizziness.  Psychiatric/Behavioral: Negative for sleep disturbance. The  patient is not nervous/anxious.      Physical Exam  BP 132/82 (BP Location: Left Arm,  Cuff Size: Normal)   Pulse 65   Wt 225 lb (102.1 kg)   LMP 05/20/2012   SpO2 98%   BMI 41.15 kg/m   Wt Readings from Last 5 Encounters:  04/01/20 225 lb (102.1 kg)  03/29/20 228 lb 6.4 oz (103.6 kg)  03/03/20 230 lb (104.3 kg)  12/11/19 237 lb 6.4 oz (107.7 kg)  08/11/19 240 lb 6.4 oz (109 kg)    BMI Readings from Last 5 Encounters:  04/01/20 41.15 kg/m  03/29/20 41.77 kg/m  03/03/20 42.07 kg/m  12/11/19 43.42 kg/m  08/11/19 43.97 kg/m     Physical Exam Vitals and nursing note reviewed.  Constitutional:      General: She is not in acute distress.    Appearance: Normal appearance. She is obese.  HENT:     Head: Normocephalic and atraumatic.     Right Ear: Tympanic membrane, ear canal and external ear normal. There is impacted cerumen.     Left Ear: Tympanic membrane, ear canal and external ear normal. There is impacted cerumen.     Nose: Rhinorrhea present. No congestion.     Mouth/Throat:     Mouth: Mucous membranes are moist.     Pharynx: Oropharynx is clear.  Eyes:     Pupils: Pupils are equal, round, and reactive to light.  Cardiovascular:     Rate and Rhythm: Normal rate and regular rhythm.     Pulses: Normal pulses.     Heart sounds: Normal heart sounds. No murmur.  Pulmonary:     Breath sounds: Normal breath sounds. No decreased air movement. No decreased breath sounds, wheezing or rales.  Musculoskeletal:     Cervical back: Normal range of motion.     Right lower leg: No edema.     Left lower leg: No edema.  Skin:    General: Skin is warm and dry.     Capillary Refill: Capillary refill takes less than 2 seconds.  Neurological:     General: No focal deficit present.     Mental Status: She is alert and oriented to person, place, and time. Mental status is at baseline.     Gait: Gait normal.  Psychiatric:        Mood and Affect: Mood normal.        Behavior:  Behavior normal.        Thought Content: Thought content normal.        Judgment: Judgment normal.     Assessment & Plan:   Moderate persistent asthma without complication Plan:  Resume Symbicort 160 Resend albuterol HFA - non generic  Needs follow-up with clinical pharmacy team for medication reconciliation as well as inhaler teaching Follow-up in 8 weeks with Dr. Valeta Harms or Wyn Quaker FNP  Cough Suspect postnasal drip upper airway cough syndrome Lungs clear on auscultation today  Plan: Restart Symbicort 160 We will send in albuterol, nongeneric Start Delsym over-the-counter cough medicine, samples provided today Tessalon Perles If symptoms or not improving please contact our office Close follow-up with Dr. Valeta Harms as well as pharmacy team follow-up for inhaler teaching  Medication management Plan: Patient needs med reconciliation Patient will need pharmacy team appointment with sign language interpreter with her    Return in about 2 months (around 06/01/2020), or if symptoms worsen or fail to improve, for Follow up with Dr. Valeta Harms, Follow up with Wyn Quaker FNP-C.   Lauraine Rinne, NP 04/01/2020   This appointment required 32 minutes of patient care (this includes precharting, chart  review, review of results, face-to-face care, etc.).

## 2020-04-01 NOTE — Assessment & Plan Note (Signed)
Plan:  Resume Symbicort 160 Resend albuterol HFA - non generic  Needs follow-up with clinical pharmacy team for medication reconciliation as well as inhaler teaching Follow-up in 8 weeks with Dr. Valeta Harms or Wyn Quaker FNP

## 2020-04-01 NOTE — Assessment & Plan Note (Signed)
Plan: Patient needs med reconciliation Patient will need pharmacy team appointment with sign language interpreter with her

## 2020-04-01 NOTE — Assessment & Plan Note (Signed)
Suspect postnasal drip upper airway cough syndrome Lungs clear on auscultation today  Plan: Restart Symbicort 160 We will send in albuterol, nongeneric Start Delsym over-the-counter cough medicine, samples provided today Tessalon Perles If symptoms or not improving please contact our office Close follow-up with Dr. Valeta Harms as well as pharmacy team follow-up for inhaler teaching

## 2020-04-08 ENCOUNTER — Encounter: Payer: Self-pay | Admitting: Family Medicine

## 2020-04-08 ENCOUNTER — Other Ambulatory Visit: Payer: Self-pay

## 2020-04-08 ENCOUNTER — Ambulatory Visit: Payer: Federal, State, Local not specified - PPO | Admitting: Family Medicine

## 2020-04-08 VITALS — BP 108/78 | HR 81 | Temp 97.1°F | Resp 18 | Ht 62.0 in | Wt 234.6 lb

## 2020-04-08 DIAGNOSIS — H6502 Acute serous otitis media, left ear: Secondary | ICD-10-CM

## 2020-04-08 DIAGNOSIS — H60331 Swimmer's ear, right ear: Secondary | ICD-10-CM | POA: Diagnosis not present

## 2020-04-08 MED ORDER — OFLOXACIN 0.3 % OT SOLN: 10.0000 [drp] | Freq: Every day | OTIC | 0 refills | Status: DC

## 2020-04-08 MED ORDER — CEFDINIR 300 MG PO CAPS: 300.0000 mg | ORAL_CAPSULE | Freq: Two times a day (BID) | ORAL | 0 refills | Status: DC

## 2020-04-08 NOTE — Patient Instructions (Signed)

## 2020-04-08 NOTE — Progress Notes (Signed)
Patient ID: Rhonda Morrow, female    DOB: [birth year]  Age: 59 y.o. MRN: [id number]    Subjective:  Subjective  HPI [name] presents for cerumen impaction b/l.  The pulm sent her here for this.  She is having some dizziness and some headaches.    Review of Systems  Constitutional: Negative for appetite change, diaphoresis, fatigue and unexpected weight change.  HENT: Positive for ear pain.   Eyes: Negative for pain, redness and visual disturbance.  Respiratory: Negative for cough, chest tightness, shortness of breath and wheezing.   Cardiovascular: Negative for chest pain, palpitations and leg swelling.  Endocrine: Negative for cold intolerance, heat intolerance, polydipsia, polyphagia and polyuria.  Genitourinary: Negative for difficulty urinating, dysuria and frequency.  Neurological: Positive for dizziness and headaches. Negative for light-headedness and numbness.    History Past Medical History:  Diagnosis Date  . Arthritis   . Asthma   . Complication of anesthesia    one time woke up and was vey scared,17 yrs ago  . Deaf   . Diabetes mellitus without complication ([place])   . GERD (gastroesophageal reflux disease)   . Hypertension   . Left groin pain   . Thyroid disease     She has a past surgical history that includes Cesarean section; Cardiac catheterization; Esophageal manometry (N/A, 09/29/2013); Cholecystectomy (N/A, 06/20/2016); and Total hip arthroplasty (Left, 02/21/2017).   Her family history includes Diabetes in her father and mother; Heart disease in her mother.She reports that she has never smoked. She has never used smokeless tobacco. She reports that she does not drink alcohol or use drugs.  Current Outpatient Medications on File Prior to Visit  Medication Sig Dispense Refill  . allopurinol (ZYLOPRIM) 100 MG tablet TAKE 1 TABLET BY MOUTH EVERY DAY 90 tablet 1  . ALPRAZolam (XANAX) 0.5 MG tablet 1 tab po 30 min prior to procedure and prn 10 tablet 0  .  amLODipine (NORVASC) 5 MG tablet Take 1 tablet (5 mg total) by mouth daily. 90 tablet 1  . aspirin EC 325 MG tablet Take 1 tablet (325 mg total) by mouth 2 (two) times daily. 84 tablet 0  . azelastine (ASTELIN) 0.1 % nasal spray Place 2 sprays into both nostrils 2 (two) times daily. Use in each nostril as directed 30 mL 3  . benzonatate (TESSALON) 200 MG capsule Take 1 capsule (200 mg total) by mouth 3 (three) times daily as needed for cough. 30 capsule 1  . Calcium Carbonate-Vit D-Min (CALCIUM 1200) 1200-1000 MG-UNIT CHEW 1 po qd 30 tablet 11  . cetirizine (ZYRTEC) 10 MG tablet TAKE 1 TABLET (10 MG TOTAL) BY MOUTH DAILY. 90 tablet 1  . colchicine 0.6 MG tablet Take 1 tablet (0.6 mg total) by mouth daily. 90 tablet 1  . Cyanocobalamin (B-12) 1000 MCG SUBL Dissolove 1 tablet under tongue once a day 90 each 1  . cyclobenzaprine (FLEXERIL) 10 MG tablet TAKE 1 TABLET BY MOUTH THREE TIMES A DAY AS NEEDED FOR MUSCLE SPASMS 30 tablet 2  . diclofenac (VOLTAREN) 75 MG EC tablet TAKE 1 TABLET (75 MG TOTAL) BY MOUTH 2 (TWO) TIMES DAILY AS NEEDED. 60 tablet 3  . diclofenac sodium (VOLTAREN) 1 % GEL Apply 4 g topically 4 (four) times daily as needed. 500 g 6  . escitalopram (LEXAPRO) 10 MG tablet Take 1 tablet (10 mg total) by mouth at bedtime. 30 tablet 2  . Fiber CHEW Chew 1 tablet by mouth daily.    . fluticasone (  FLONASE) 50 MCG/ACT nasal spray Place 2 sprays into both nostrils daily. 16 g 6  . fluticasone (FLONASE) 50 MCG/ACT nasal spray PLACE 2 SPRAYS INTO BOTH NOSTRILS DAILY. 16 g 4  . glucose blood test strip Use as instructed--- one touch verio test strips 100 each 12  . hydrochlorothiazide (HYDRODIURIL) 12.5 MG tablet Take 1 tablet (12.5 mg total) by mouth daily. 90 tablet 1  . hydrOXYzine (ATARAX/VISTARIL) 10 MG tablet TAKE 1 TO 2 TABLETS BY MOUTH AT BEDTIME AS NEEDED FOR ITCHING 180 tablet 1  . ipratropium-albuterol (DUONEB) 0.5-2.5 (3) MG/3ML SOLN Take 3 mLs by nebulization every 6 (six) hours as  needed. 360 mL 3  . Lancets (ONETOUCH ULTRASOFT) lancets Use as instructed 100 each 12  . meclizine (ANTIVERT) 12.5 MG tablet Take 1 tablet (12.5 mg total) by mouth 3 (three) times daily as needed for dizziness. 30 tablet 0  . metaxalone (SKELAXIN) 800 MG tablet TAKE 1 TABLET BY MOUTH THREE TIMES A DAY (Patient taking differently: Take 800 mg by mouth 3 (three) times daily. ) 30 tablet 2  . metFORMIN (GLUCOPHAGE-XR) 500 MG 24 hr tablet Take 1 tablet (500 mg total) by mouth daily with breakfast. 90 tablet 1  . mometasone-formoterol (DULERA) 100-5 MCG/ACT AERO Inhale 2 puffs into the lungs 2 (two) times daily. 1 Inhaler PRN  . montelukast (SINGULAIR) 10 MG tablet Take 1 tablet (10 mg total) by mouth at bedtime. 90 tablet 3  . Multiple Vitamin (MULTIVITAMIN) tablet Take 1 tablet by mouth daily.    . NONFORMULARY OR COMPOUNDED ITEM Nebulizer   DX ASTHMA 1 each 0  . nystatin-triamcinolone (MYCOLOG II) cream Apply twice daily to lower chest area/upper abdomen 30 g 0  . ondansetron (ZOFRAN ODT) 4 MG disintegrating tablet Take 1 tablet (4 mg total) by mouth every 8 (eight) hours as needed for nausea or vomiting. 10 tablet 0  . pantoprazole (PROTONIX) 40 MG tablet Take 1 tablet (40 mg total) by mouth daily. 30 tablet 3  . potassium chloride SA (KLOR-CON M20) 20 MEQ tablet Take 1 tablet (20 mEq total) by mouth daily. 90 tablet 1  . ranitidine (ZANTAC) 150 MG tablet TAKE 1 TABLET BY MOUTH TWICE A DAY (Patient taking differently: Take 150 mg by mouth 2 (two) times daily. ) 180 tablet 1  . Spacer/Aero-Holding Chambers (AEROCHAMBER MV) inhaler Use as instructed 1 each 0  . SYMBICORT 160-4.5 MCG/ACT inhaler Inhale 2 puffs into the lungs every 12 (twelve) hours. 1 Inhaler 5  . traMADol (ULTRAM) 50 MG tablet TAKE 1 TABLET (50 MG TOTAL) BY MOUTH EVERY 8 (EIGHT) HOURS AS NEEDED FOR UP TO 5 DAYS. 16 tablet 0  . triamcinolone cream (KENALOG) 0.1 % Apply bid to hands 30 g 0  . VENTOLIN HFA 108 (90 Base) MCG/ACT inhaler  Inhale 2 puffs into the lungs every 6 (six) hours as needed for wheezing or shortness of breath. 18 g 5   No current facility-administered medications on file prior to visit.     Objective:  Objective  Physical Exam Vitals and nursing note reviewed.  Constitutional:      Appearance: She is well-developed.  HENT:     Head: Normocephalic and atraumatic.     Right Ear: There is impacted cerumen.     Left Ear: There is impacted cerumen. Tympanic membrane is injected and erythematous.     Ears:   Eyes:     Conjunctiva/sclera: Conjunctivae normal.  Neck:     Thyroid: No thyromegaly.  Vascular: No carotid bruit or JVD.  Cardiovascular:     Rate and Rhythm: Normal rate and regular rhythm.     Heart sounds: Normal heart sounds. No murmur.  Pulmonary:     Effort: Pulmonary effort is normal. No respiratory distress.     Breath sounds: Normal breath sounds. No wheezing or rales.  Chest:     Chest wall: No tenderness.  Musculoskeletal:     Cervical back: Normal range of motion and neck supple.  Neurological:     Mental Status: She is alert and oriented to person, place, and time.    BP 108/78 (BP Location: Right Arm, Patient Position: Sitting, Cuff Size: Large)   Pulse 81   Temp (!) 97.1 F (36.2 C) (Temporal)   Resp 18   Ht 5\' 2"  (1.575 m)   Wt 234 lb 9.6 oz (106.4 kg)   LMP 05/20/2012   SpO2 96%   BMI 42.91 kg/m  Wt Readings from Last 3 Encounters:  04/08/20 234 lb 9.6 oz (106.4 kg)  04/01/20 225 lb (102.1 kg)  03/29/20 228 lb 6.4 oz (103.6 kg)     Lab Results  Component Value Date   WBC 5.3 12/11/2019   HGB 14.5 12/11/2019   HCT 44.9 12/11/2019   PLT 149.0 (L) 12/11/2019   GLUCOSE 156 (H) 03/03/2020   CHOL 142 05/23/2019   TRIG 79.0 05/23/2019   HDL 54.00 05/23/2019   LDLCALC 72 05/23/2019   ALT 30 12/11/2019   AST 24 12/11/2019   NA 139 03/03/2020   K 3.0 (L) 03/03/2020   CL 100 03/03/2020   CREATININE 0.86 03/03/2020   BUN 14 03/03/2020   CO2 31  03/03/2020   TSH 1.36 05/14/2019   INR 1.05 02/12/2017   HGBA1C 6.6 (H) 03/03/2020   MICROALBUR 1.3 03/03/2020    No results found.   Assessment & Plan:  Plan  I have discontinued Verdis Frederickson A. Stocking's cefdinir. I am also having her start on cefdinir and ofloxacin. Additionally, I am having her maintain her multivitamin, Fiber, NONFORMULARY OR COMPOUNDED ITEM, azelastine, aspirin EC, metaxalone, meclizine, ranitidine, ondansetron, diclofenac sodium, mometasone-formoterol, montelukast, ipratropium-albuterol, AeroChamber MV, pantoprazole, B-12, triamcinolone cream, nystatin-triamcinolone, colchicine, traMADol, fluticasone, ALPRAZolam, diclofenac, allopurinol, hydrOXYzine, fluticasone, cetirizine, cyclobenzaprine, amLODipine, hydrochlorothiazide, potassium chloride SA, metFORMIN, onetouch ultrasoft, glucose blood, Calcium 1200, escitalopram, benzonatate, Symbicort, and Ventolin HFA.  Meds ordered this encounter  Medications  . cefdinir (OMNICEF) 300 MG capsule    Sig: Take 1 capsule (300 mg total) by mouth 2 (two) times daily.    Dispense:  20 capsule    Refill:  0  . ofloxacin (FLOXIN OTIC) 0.3 % OTIC solution    Sig: Place 10 drops into the right ear daily.    Dispense:  10 mL    Refill:  0    Problem List Items Addressed This Visit    None    Visit Diagnoses    Non-recurrent acute serous otitis media of left ear    -  Primary   Relevant Medications   cefdinir (OMNICEF) 300 MG capsule   Acute swimmer's ear of right side       Relevant Medications   ofloxacin (FLOXIN OTIC) 0.3 % OTIC solution      Follow-up: Return if symptoms worsen or fail to improve.  Ann Held, DO

## 2020-04-19 NOTE — Progress Notes (Signed)
PCCM: thanks for seeing her. Agreed.  Garner Nash, DO [facility] 04/19/2020 5:51 PM

## 2020-04-20 ENCOUNTER — Encounter: Payer: Self-pay | Admitting: Family Medicine

## 2020-04-20 ENCOUNTER — Other Ambulatory Visit: Payer: Self-pay

## 2020-04-20 ENCOUNTER — Ambulatory Visit (INDEPENDENT_AMBULATORY_CARE_PROVIDER_SITE_OTHER): Payer: Federal, State, Local not specified - PPO | Admitting: Family Medicine

## 2020-04-20 VITALS — BP 132/84 | HR 80 | Temp 97.4°F | Resp 18 | Ht 62.0 in | Wt 232.6 lb

## 2020-04-20 DIAGNOSIS — R232 Flushing: Secondary | ICD-10-CM | POA: Diagnosis not present

## 2020-04-20 DIAGNOSIS — E876 Hypokalemia: Secondary | ICD-10-CM | POA: Diagnosis not present

## 2020-04-20 DIAGNOSIS — G4700 Insomnia, unspecified: Secondary | ICD-10-CM

## 2020-04-20 NOTE — Patient Instructions (Signed)

## 2020-04-20 NOTE — Addendum Note (Signed)
Addended by: Kelle Darting A on: 04/20/2020 02:36 PM   Modules accepted: Orders

## 2020-04-20 NOTE — Assessment & Plan Note (Signed)
Better with AC at night No further tx right now

## 2020-04-20 NOTE — Progress Notes (Signed)
Patient ID: Rhonda Morrow, female    DOB: [birth year]  Age: 59 y.o. MRN: [id number]    Subjective:  Subjective  HPI Rhonda Morrow presents for f/u hot flashes and sleep.   The med made it harder for her to sleep so she stopped it and now the ACis on and she has no trouble.   No other complaints   Review of Systems  Constitutional: Negative for appetite change, diaphoresis, fatigue and unexpected weight change.  Eyes: Negative for pain, redness and visual disturbance.  Respiratory: Negative for cough, chest tightness, shortness of breath and wheezing.   Cardiovascular: Negative for chest pain, palpitations and leg swelling.  Endocrine: Negative for cold intolerance, heat intolerance, polydipsia, polyphagia and polyuria.  Genitourinary: Negative for difficulty urinating, dysuria and frequency.  Neurological: Negative for dizziness, light-headedness, numbness and headaches.    History Past Medical History:  Diagnosis Date  . Arthritis   . Asthma   . Complication of anesthesia    one time woke up and was vey scared,17 yrs ago  . Deaf   . Diabetes mellitus without complication ([place])   . GERD (gastroesophageal reflux disease)   . Hypertension   . Left groin pain   . Thyroid disease     She has a past surgical history that includes Cesarean section; Cardiac catheterization; Esophageal manometry (N/A, 09/29/2013); Cholecystectomy (N/A, 06/20/2016); and Total hip arthroplasty (Left, 02/21/2017).   Her family history includes Diabetes in her father and mother; Heart disease in her mother.She reports that she has never smoked. She has never used smokeless tobacco. She reports that she does not drink alcohol and does not use drugs.  Current Outpatient Medications on File Prior to Visit  Medication Sig Dispense Refill  . allopurinol (ZYLOPRIM) 100 MG tablet TAKE 1 TABLET BY MOUTH EVERY DAY 90 tablet 1  . amLODipine (NORVASC) 5 MG tablet Take 1 tablet (5 mg total) by mouth daily. 90 tablet 1    . aspirin EC 325 MG tablet Take 1 tablet (325 mg total) by mouth 2 (two) times daily. 84 tablet 0  . azelastine (ASTELIN) 0.1 % nasal spray Place 2 sprays into both nostrils 2 (two) times daily. Use in each nostril as directed 30 mL 3  . benzonatate (TESSALON) 200 MG capsule Take 1 capsule (200 mg total) by mouth 3 (three) times daily as needed for cough. 30 capsule 1  . Calcium Carbonate-Vit D-Min (CALCIUM 1200) 1200-1000 MG-UNIT CHEW 1 po qd 30 tablet 11  . cefdinir (OMNICEF) 300 MG capsule Take 1 capsule (300 mg total) by mouth 2 (two) times daily. 20 capsule 0  . cetirizine (ZYRTEC) 10 MG tablet TAKE 1 TABLET (10 MG TOTAL) BY MOUTH DAILY. 90 tablet 1  . colchicine 0.6 MG tablet Take 1 tablet (0.6 mg total) by mouth daily. 90 tablet 1  . Cyanocobalamin (B-12) 1000 MCG SUBL Dissolove 1 tablet under tongue once a day 90 each 1  . cyclobenzaprine (FLEXERIL) 10 MG tablet TAKE 1 TABLET BY MOUTH THREE TIMES A DAY AS NEEDED FOR MUSCLE SPASMS 30 tablet 2  . diclofenac (VOLTAREN) 75 MG EC tablet TAKE 1 TABLET (75 MG TOTAL) BY MOUTH 2 (TWO) TIMES DAILY AS NEEDED. 60 tablet 3  . diclofenac sodium (VOLTAREN) 1 % GEL Apply 4 g topically 4 (four) times daily as needed. 500 g 6  . Fiber CHEW Chew 1 tablet by mouth daily.    . fluticasone (FLONASE) 50 MCG/ACT nasal spray Place 2 sprays into both nostrils  daily. 16 g 6  . fluticasone (FLONASE) 50 MCG/ACT nasal spray PLACE 2 SPRAYS INTO BOTH NOSTRILS DAILY. 16 g 4  . glucose blood test strip Use as instructed--- one touch verio test strips 100 each 12  . hydrochlorothiazide (HYDRODIURIL) 12.5 MG tablet Take 1 tablet (12.5 mg total) by mouth daily. 90 tablet 1  . hydrOXYzine (ATARAX/VISTARIL) 10 MG tablet TAKE 1 TO 2 TABLETS BY MOUTH AT BEDTIME AS NEEDED FOR ITCHING 180 tablet 1  . ipratropium-albuterol (DUONEB) 0.5-2.5 (3) MG/3ML SOLN Take 3 mLs by nebulization every 6 (six) hours as needed. 360 mL 3  . Lancets (ONETOUCH ULTRASOFT) lancets Use as instructed 100  each 12  . meclizine (ANTIVERT) 12.5 MG tablet Take 1 tablet (12.5 mg total) by mouth 3 (three) times daily as needed for dizziness. 30 tablet 0  . metaxalone (SKELAXIN) 800 MG tablet TAKE 1 TABLET BY MOUTH THREE TIMES A DAY (Patient taking differently: Take 800 mg by mouth 3 (three) times daily. ) 30 tablet 2  . metFORMIN (GLUCOPHAGE-XR) 500 MG 24 hr tablet Take 1 tablet (500 mg total) by mouth daily with breakfast. 90 tablet 1  . mometasone-formoterol (DULERA) 100-5 MCG/ACT AERO Inhale 2 puffs into the lungs 2 (two) times daily. 1 Inhaler PRN  . montelukast (SINGULAIR) 10 MG tablet Take 1 tablet (10 mg total) by mouth at bedtime. 90 tablet 3  . Multiple Vitamin (MULTIVITAMIN) tablet Take 1 tablet by mouth daily.    . NONFORMULARY OR COMPOUNDED ITEM Nebulizer   DX ASTHMA 1 each 0  . nystatin-triamcinolone (MYCOLOG II) cream Apply twice daily to lower chest area/upper abdomen 30 g 0  . ofloxacin (FLOXIN OTIC) 0.3 % OTIC solution Place 10 drops into the right ear daily. 10 mL 0  . ondansetron (ZOFRAN ODT) 4 MG disintegrating tablet Take 1 tablet (4 mg total) by mouth every 8 (eight) hours as needed for nausea or vomiting. 10 tablet 0  . pantoprazole (PROTONIX) 40 MG tablet Take 1 tablet (40 mg total) by mouth daily. 30 tablet 3  . potassium chloride SA (KLOR-CON M20) 20 MEQ tablet Take 1 tablet (20 mEq total) by mouth daily. 90 tablet 1  . ranitidine (ZANTAC) 150 MG tablet TAKE 1 TABLET BY MOUTH TWICE A DAY (Patient taking differently: Take 150 mg by mouth 2 (two) times daily. ) 180 tablet 1  . Spacer/Aero-Holding Chambers (AEROCHAMBER MV) inhaler Use as instructed 1 each 0  . SYMBICORT 160-4.5 MCG/ACT inhaler Inhale 2 puffs into the lungs every 12 (twelve) hours. 1 Inhaler 5  . traMADol (ULTRAM) 50 MG tablet TAKE 1 TABLET (50 MG TOTAL) BY MOUTH EVERY 8 (EIGHT) HOURS AS NEEDED FOR UP TO 5 DAYS. 16 tablet 0  . triamcinolone cream (KENALOG) 0.1 % Apply bid to hands 30 g 0  . VENTOLIN HFA 108 (90 Base)  MCG/ACT inhaler Inhale 2 puffs into the lungs every 6 (six) hours as needed for wheezing or shortness of breath. 18 g 5   No current facility-administered medications on file prior to visit.     Objective:  Objective  Physical Exam Vitals and nursing note reviewed.  Constitutional:      Appearance: She is well-developed.  HENT:     Head: Normocephalic and atraumatic.  Eyes:     Conjunctiva/sclera: Conjunctivae normal.  Neck:     Thyroid: No thyromegaly.     Vascular: No carotid bruit or JVD.  Cardiovascular:     Rate and Rhythm: Normal rate and regular rhythm.  Heart sounds: Normal heart sounds. No murmur heard.   Pulmonary:     Effort: Pulmonary effort is normal. No respiratory distress.     Breath sounds: Normal breath sounds. No wheezing or rales.  Chest:     Chest wall: No tenderness.  Musculoskeletal:     Cervical back: Normal range of motion and neck supple.  Neurological:     Mental Status: She is alert and oriented to person, place, and time.  Psychiatric:        Mood and Affect: Mood normal.        Behavior: Behavior normal.        Thought Content: Thought content normal.        Judgment: Judgment normal.    BP 132/84 (BP Location: Right Arm, Patient Position: Sitting, Cuff Size: Large)   Pulse 80   Temp (!) 97.4 F (36.3 C) (Temporal)   Resp 18   Ht 5\' 2"  (1.575 m)   Wt 232 lb 9.6 oz (105.5 kg)   LMP 05/20/2012   SpO2 95%   BMI 42.54 kg/m  Wt Readings from Last 3 Encounters:  04/20/20 232 lb 9.6 oz (105.5 kg)  04/08/20 234 lb 9.6 oz (106.4 kg)  04/01/20 225 lb (102.1 kg)     Lab Results  Component Value Date   WBC 5.3 12/11/2019   HGB 14.5 12/11/2019   HCT 44.9 12/11/2019   PLT 149.0 (L) 12/11/2019   GLUCOSE 156 (H) 03/03/2020   CHOL 142 05/23/2019   TRIG 79.0 05/23/2019   HDL 54.00 05/23/2019   LDLCALC 72 05/23/2019   ALT 30 12/11/2019   AST 24 12/11/2019   NA 139 03/03/2020   K 3.0 (L) 03/03/2020   CL 100 03/03/2020   CREATININE  0.86 03/03/2020   BUN 14 03/03/2020   CO2 31 03/03/2020   TSH 1.36 05/14/2019   INR 1.05 02/12/2017   HGBA1C 6.6 (H) 03/03/2020   MICROALBUR 1.3 03/03/2020    No results found.   Assessment & Plan:  Plan  I have discontinued Verdis Frederickson A. Drawdy's ALPRAZolam and escitalopram. I am also having her maintain her multivitamin, Fiber, NONFORMULARY OR COMPOUNDED ITEM, azelastine, aspirin EC, metaxalone, meclizine, ranitidine, ondansetron, diclofenac sodium, mometasone-formoterol, montelukast, ipratropium-albuterol, AeroChamber MV, pantoprazole, B-12, triamcinolone cream, nystatin-triamcinolone, colchicine, traMADol, fluticasone, diclofenac, allopurinol, hydrOXYzine, fluticasone, cetirizine, cyclobenzaprine, amLODipine, hydrochlorothiazide, potassium chloride SA, metFORMIN, onetouch ultrasoft, glucose blood, Calcium 1200, benzonatate, Symbicort, Ventolin HFA, cefdinir, and ofloxacin.  No orders of the defined types were placed in this encounter.   Problem List Items Addressed This Visit      Unprioritized   Hot flashes    Better with AC at night No further tx right now      Hypokalemia    Check labs  She has not been taking the K  This may be causing her leg cramps at night        Other Visit Diagnoses    Insomnia, unspecified type    -  Primary      Follow-up: Return in about 3 months (around 07/21/2020), or if symptoms worsen or fail to improve.  Ann Held, DO

## 2020-04-20 NOTE — Assessment & Plan Note (Signed)
Check labs  She has not been taking the K  This may be causing her leg cramps at night

## 2020-04-21 LAB — COMPREHENSIVE METABOLIC PANEL
ALT: 18 U/L (ref 0–35)
AST: 20 U/L (ref 0–37)
Albumin: 3.8 g/dL (ref 3.5–5.2)
Alkaline Phosphatase: 72 U/L (ref 39–117)
BUN: 17 mg/dL (ref 6–23)
CO2: 32 mEq/L (ref 19–32)
Calcium: 9.2 mg/dL (ref 8.4–10.5)
Chloride: 102 mEq/L (ref 96–112)
Creatinine, Ser: 0.83 mg/dL (ref 0.40–1.20)
GFR: 85.86 mL/min (ref >60.00)
Glucose, Bld: 115 mg/dL — ABNORMAL HIGH (ref 70–99)
Potassium: 3.8 mEq/L (ref 3.5–5.1)
Sodium: 140 mEq/L (ref 135–145)
Total Bilirubin: 0.6 mg/dL (ref 0.2–1.2)
Total Protein: 6.6 g/dL (ref 6.0–8.3)

## 2020-04-21 LAB — CBC WITH DIFFERENTIAL/PLATELET
Basophils Absolute: 0.1 10*3/uL (ref 0.0–0.1)
Basophils Relative: 0.9 % (ref 0.0–3.0)
Eosinophils Absolute: 0.1 10*3/uL (ref 0.0–0.7)
Eosinophils Relative: 2 % (ref 0.0–5.0)
HCT: 40.9 % (ref 36.0–46.0)
Hemoglobin: 13.6 g/dL (ref 12.0–15.0)
Lymphocytes Relative: 32.8 % (ref 12.0–46.0)
Lymphs Abs: 1.8 10*3/uL (ref 0.7–4.0)
MCHC: 33.3 g/dL (ref 30.0–36.0)
MCV: 84.8 fl (ref 78.0–100.0)
Monocytes Absolute: 0.5 10*3/uL (ref 0.1–1.0)
Monocytes Relative: 9.6 % (ref 3.0–12.0)
Neutro Abs: 2.9 10*3/uL (ref 1.4–7.7)
Neutrophils Relative %: 54.7 % (ref 43.0–77.0)
Platelets: 178 10*3/uL (ref 150.0–400.0)
RBC: 4.82 Mil/uL (ref 3.87–5.11)
RDW: 14.5 % (ref 11.5–15.5)
WBC: 5.4 10*3/uL (ref 4.0–10.5)

## 2020-04-21 LAB — THYROID PANEL WITH TSH
Free Thyroxine Index: 3.1 (ref 1.4–3.8)
T3 Uptake: 29 % (ref 22–35)
T4, Total: 10.8 ug/dL (ref 5.1–11.9)
TSH: 0.74 mIU/L (ref 0.40–4.50)

## 2020-05-11 ENCOUNTER — Telehealth: Payer: Self-pay | Admitting: Family Medicine

## 2020-05-11 NOTE — Telephone Encounter (Signed)
Spoke patient. Pt advised that Lowne not in the office this week and she would need to see a different provider. Pt states she could try to wait till next week and made a appointment with Lowne.

## 2020-05-11 NOTE — Telephone Encounter (Signed)
Ramond Craver  Call Back # [phone]  Per Verdis Frederickson, Her cramps have not improved.   Please Advise

## 2020-05-17 ENCOUNTER — Encounter: Payer: Self-pay | Admitting: Family Medicine

## 2020-05-17 ENCOUNTER — Ambulatory Visit: Payer: Federal, State, Local not specified - PPO | Admitting: Family Medicine

## 2020-05-17 ENCOUNTER — Other Ambulatory Visit: Payer: Self-pay

## 2020-05-17 VITALS — BP 138/78 | HR 90 | Temp 97.1°F | Resp 18 | Ht 62.0 in | Wt 235.0 lb

## 2020-05-17 DIAGNOSIS — G2581 Restless legs syndrome: Secondary | ICD-10-CM | POA: Diagnosis not present

## 2020-05-17 DIAGNOSIS — M10079 Idiopathic gout, unspecified ankle and foot: Secondary | ICD-10-CM | POA: Diagnosis not present

## 2020-05-17 DIAGNOSIS — G4700 Insomnia, unspecified: Secondary | ICD-10-CM | POA: Diagnosis not present

## 2020-05-17 MED ORDER — ROPINIROLE HCL 0.25 MG PO TABS: ORAL_TABLET | ORAL | 0 refills | Status: DC

## 2020-05-17 MED ORDER — ALLOPURINOL 100 MG PO TABS: 100.0000 mg | ORAL_TABLET | Freq: Every day | ORAL | 3 refills | Status: DC

## 2020-05-17 NOTE — Progress Notes (Signed)
Patient ID: Rhonda Morrow, female    DOB: 05-01-1961  Age: 59 y.o. MRN: [id number]    Subjective:  Subjective  HPI Rhonda Morrow presents for f/u restless legs   Review of Systems  Constitutional: Negative for appetite change, diaphoresis, fatigue and unexpected weight change.  Eyes: Negative for pain, redness and visual disturbance.  Respiratory: Negative for cough, chest tightness, shortness of breath and wheezing.   Cardiovascular: Negative for chest pain, palpitations and leg swelling.  Endocrine: Negative for cold intolerance, heat intolerance, polydipsia, polyphagia and polyuria.  Genitourinary: Negative for difficulty urinating, dysuria and frequency.  Neurological: Negative for dizziness, light-headedness, numbness and headaches.    History Past Medical History:  Diagnosis Date  . Arthritis   . Asthma   . Complication of anesthesia    one time woke up and was vey scared,17 yrs ago  . Deaf   . Diabetes mellitus without complication ([place])   . GERD (gastroesophageal reflux disease)   . Hypertension   . Left groin pain   . Thyroid disease     She has a past surgical history that includes Cesarean section; Cardiac catheterization; Esophageal manometry (N/A, 09/29/2013); Cholecystectomy (N/A, 06/20/2016); and Total hip arthroplasty (Left, 02/21/2017).   Her family history includes Diabetes in her father and mother; Heart disease in her mother.She reports that she has never smoked. She has never used smokeless tobacco. She reports that she does not drink alcohol and does not use drugs.  Current Outpatient Medications on File Prior to Visit  Medication Sig Dispense Refill  . amLODipine (NORVASC) 5 MG tablet Take 1 tablet (5 mg total) by mouth daily. 90 tablet 1  . aspirin EC 325 MG tablet Take 1 tablet (325 mg total) by mouth 2 (two) times daily. 84 tablet 0  . azelastine (ASTELIN) 0.1 % nasal spray Place 2 sprays into both nostrils 2 (two) times daily. Use in each nostril as  directed 30 mL 3  . benzonatate (TESSALON) 200 MG capsule Take 1 capsule (200 mg total) by mouth 3 (three) times daily as needed for cough. 30 capsule 1  . Calcium Carbonate-Vit D-Min (CALCIUM 1200) 1200-1000 MG-UNIT CHEW 1 po qd 30 tablet 11  . cefdinir (OMNICEF) 300 MG capsule Take 1 capsule (300 mg total) by mouth 2 (two) times daily. 20 capsule 0  . cetirizine (ZYRTEC) 10 MG tablet TAKE 1 TABLET (10 MG TOTAL) BY MOUTH DAILY. 90 tablet 1  . colchicine 0.6 MG tablet Take 1 tablet (0.6 mg total) by mouth daily. 90 tablet 1  . Cyanocobalamin (B-12) 1000 MCG SUBL Dissolove 1 tablet under tongue once a day 90 each 1  . cyclobenzaprine (FLEXERIL) 10 MG tablet TAKE 1 TABLET BY MOUTH THREE TIMES A DAY AS NEEDED FOR MUSCLE SPASMS 30 tablet 2  . diclofenac (VOLTAREN) 75 MG EC tablet TAKE 1 TABLET (75 MG TOTAL) BY MOUTH 2 (TWO) TIMES DAILY AS NEEDED. 60 tablet 3  . diclofenac sodium (VOLTAREN) 1 % GEL Apply 4 g topically 4 (four) times daily as needed. 500 g 6  . Fiber CHEW Chew 1 tablet by mouth daily.    . fluticasone (FLONASE) 50 MCG/ACT nasal spray Place 2 sprays into both nostrils daily. 16 g 6  . fluticasone (FLONASE) 50 MCG/ACT nasal spray PLACE 2 SPRAYS INTO BOTH NOSTRILS DAILY. 16 g 4  . glucose blood test strip Use as instructed--- one touch verio test strips 100 each 12  . hydrochlorothiazide (HYDRODIURIL) 12.5 MG tablet Take 1 tablet (12.5  mg total) by mouth daily. 90 tablet 1  . hydrOXYzine (ATARAX/VISTARIL) 10 MG tablet TAKE 1 TO 2 TABLETS BY MOUTH AT BEDTIME AS NEEDED FOR ITCHING 180 tablet 1  . ipratropium-albuterol (DUONEB) 0.5-2.5 (3) MG/3ML SOLN Take 3 mLs by nebulization every 6 (six) hours as needed. 360 mL 3  . Lancets (ONETOUCH ULTRASOFT) lancets Use as instructed 100 each 12  . meclizine (ANTIVERT) 12.5 MG tablet Take 1 tablet (12.5 mg total) by mouth 3 (three) times daily as needed for dizziness. 30 tablet 0  . metaxalone (SKELAXIN) 800 MG tablet TAKE 1 TABLET BY MOUTH THREE TIMES  A DAY (Patient taking differently: Take 800 mg by mouth 3 (three) times daily. ) 30 tablet 2  . metFORMIN (GLUCOPHAGE-XR) 500 MG 24 hr tablet Take 1 tablet (500 mg total) by mouth daily with breakfast. 90 tablet 1  . mometasone-formoterol (DULERA) 100-5 MCG/ACT AERO Inhale 2 puffs into the lungs 2 (two) times daily. 1 Inhaler PRN  . montelukast (SINGULAIR) 10 MG tablet Take 1 tablet (10 mg total) by mouth at bedtime. 90 tablet 3  . Multiple Vitamin (MULTIVITAMIN) tablet Take 1 tablet by mouth daily.    . NONFORMULARY OR COMPOUNDED ITEM Nebulizer   DX ASTHMA 1 each 0  . nystatin-triamcinolone (MYCOLOG II) cream Apply twice daily to lower chest area/upper abdomen 30 g 0  . ofloxacin (FLOXIN OTIC) 0.3 % OTIC solution Place 10 drops into the right ear daily. 10 mL 0  . ondansetron (ZOFRAN ODT) 4 MG disintegrating tablet Take 1 tablet (4 mg total) by mouth every 8 (eight) hours as needed for nausea or vomiting. 10 tablet 0  . pantoprazole (PROTONIX) 40 MG tablet Take 1 tablet (40 mg total) by mouth daily. 30 tablet 3  . potassium chloride SA (KLOR-CON M20) 20 MEQ tablet Take 1 tablet (20 mEq total) by mouth daily. 90 tablet 1  . ranitidine (ZANTAC) 150 MG tablet TAKE 1 TABLET BY MOUTH TWICE A DAY (Patient taking differently: Take 150 mg by mouth 2 (two) times daily. ) 180 tablet 1  . Spacer/Aero-Holding Chambers (AEROCHAMBER MV) inhaler Use as instructed 1 each 0  . SYMBICORT 160-4.5 MCG/ACT inhaler Inhale 2 puffs into the lungs every 12 (twelve) hours. 1 Inhaler 5  . traMADol (ULTRAM) 50 MG tablet TAKE 1 TABLET (50 MG TOTAL) BY MOUTH EVERY 8 (EIGHT) HOURS AS NEEDED FOR UP TO 5 DAYS. 16 tablet 0  . triamcinolone cream (KENALOG) 0.1 % Apply bid to hands 30 g 0  . VENTOLIN HFA 108 (90 Base) MCG/ACT inhaler Inhale 2 puffs into the lungs every 6 (six) hours as needed for wheezing or shortness of breath. 18 g 5   No current facility-administered medications on file prior to visit.     Objective:    Objective  Physical Exam Vitals and nursing note reviewed.  Constitutional:      Appearance: She is well-developed.  HENT:     Head: Normocephalic and atraumatic.  Eyes:     Conjunctiva/sclera: Conjunctivae normal.  Neck:     Thyroid: No thyromegaly.     Vascular: No carotid bruit or JVD.  Cardiovascular:     Rate and Rhythm: Normal rate and regular rhythm.     Heart sounds: Normal heart sounds. No murmur heard.   Pulmonary:     Effort: Pulmonary effort is normal. No respiratory distress.     Breath sounds: Normal breath sounds. No wheezing or rales.  Chest:     Chest wall: No tenderness.  Musculoskeletal:     Cervical back: Normal range of motion and neck supple.  Neurological:     Mental Status: She is alert and oriented to person, place, and time.    BP 138/78 (BP Location: Right Arm, Patient Position: Sitting, Cuff Size: Large)   Pulse 90   Temp (!) 97.1 F (36.2 C) (Temporal)   Resp 18   Ht 5\' 2"  (1.575 m)   Wt 235 lb (106.6 kg)   LMP 05/20/2012   SpO2 97%   BMI 42.98 kg/m  Wt Readings from Last 3 Encounters:  05/17/20 235 lb (106.6 kg)  04/20/20 232 lb 9.6 oz (105.5 kg)  04/08/20 234 lb 9.6 oz (106.4 kg)     Lab Results  Component Value Date   WBC 5.4 04/20/2020   HGB 13.6 04/20/2020   HCT 40.9 04/20/2020   PLT 178.0 04/20/2020   GLUCOSE 115 (H) 04/20/2020   CHOL 142 05/23/2019   TRIG 79.0 05/23/2019   HDL 54.00 05/23/2019   LDLCALC 72 05/23/2019   ALT 18 04/20/2020   AST 20 04/20/2020   NA 140 04/20/2020   K 3.8 04/20/2020   CL 102 04/20/2020   CREATININE 0.83 04/20/2020   BUN 17 04/20/2020   CO2 32 04/20/2020   TSH 0.74 04/20/2020   INR 1.05 02/12/2017   HGBA1C 6.6 (H) 03/03/2020   MICROALBUR 1.3 03/03/2020    No results found.   Assessment & Plan:  Plan  I have changed Rhonda Morrow's allopurinol. I am also having her start on rOPINIRole. Additionally, I am having her maintain her multivitamin, Fiber, NONFORMULARY OR COMPOUNDED  ITEM, azelastine, aspirin EC, metaxalone, meclizine, ranitidine, ondansetron, diclofenac sodium, mometasone-formoterol, montelukast, ipratropium-albuterol, AeroChamber MV, pantoprazole, B-12, triamcinolone cream, nystatin-triamcinolone, colchicine, traMADol, fluticasone, diclofenac, hydrOXYzine, fluticasone, cetirizine, cyclobenzaprine, amLODipine, hydrochlorothiazide, potassium chloride SA, metFORMIN, onetouch ultrasoft, glucose blood, Calcium 1200, benzonatate, Symbicort, Ventolin HFA, cefdinir, and ofloxacin.  Meds ordered this encounter  Medications  . rOPINIRole (REQUIP) 0.25 MG tablet    Sig: 1 po qhs x 4 days then can increase to 2 at night if needed    Dispense:  60 tablet    Refill:  0  . allopurinol (ZYLOPRIM) 100 MG tablet    Sig: Take 1 tablet (100 mg total) by mouth daily.    Dispense:  90 tablet    Refill:  3    Problem List Items Addressed This Visit      Unprioritized   Restless leg syndrome - Primary    Try requip-- start with 1 po qhs x 3-4 nights then inc to 2 if needed Labs reviewed again k and hgb normal  Consider neuro referral for sleep study      Relevant Medications   rOPINIRole (REQUIP) 0.25 MG tablet   Other Relevant Orders   Ambulatory referral to Neurology    Other Visit Diagnoses    Insomnia, unspecified type       Relevant Orders   Ambulatory referral to Neurology   Acute idiopathic gout of foot, unspecified laterality       Relevant Medications   allopurinol (ZYLOPRIM) 100 MG tablet      Follow-up: Return in about 3 months (around 08/17/2020), or if symptoms worsen or fail to improve.  Ann Held, DO

## 2020-05-17 NOTE — Assessment & Plan Note (Signed)
Try requip-- start with 1 po qhs x 3-4 nights then inc to 2 if needed Labs reviewed again k and hgb normal  Consider neuro referral for sleep study

## 2020-05-17 NOTE — Patient Instructions (Signed)

## 2020-05-20 DIAGNOSIS — Z20828 Contact with and (suspected) exposure to other viral communicable diseases: Secondary | ICD-10-CM | POA: Diagnosis not present

## 2020-05-20 DIAGNOSIS — R0981 Nasal congestion: Secondary | ICD-10-CM | POA: Diagnosis not present

## 2020-05-20 DIAGNOSIS — R05 Cough: Secondary | ICD-10-CM | POA: Diagnosis not present

## 2020-05-20 DIAGNOSIS — R0982 Postnasal drip: Secondary | ICD-10-CM | POA: Diagnosis not present

## 2020-05-22 DIAGNOSIS — J189 Pneumonia, unspecified organism: Secondary | ICD-10-CM | POA: Diagnosis not present

## 2020-05-22 DIAGNOSIS — R05 Cough: Secondary | ICD-10-CM | POA: Diagnosis not present

## 2020-05-23 ENCOUNTER — Other Ambulatory Visit: Payer: Self-pay

## 2020-05-23 DIAGNOSIS — Z79899 Other long term (current) drug therapy: Secondary | ICD-10-CM | POA: Insufficient documentation

## 2020-05-23 DIAGNOSIS — R7989 Other specified abnormal findings of blood chemistry: Secondary | ICD-10-CM | POA: Insufficient documentation

## 2020-05-23 DIAGNOSIS — E1151 Type 2 diabetes mellitus with diabetic peripheral angiopathy without gangrene: Secondary | ICD-10-CM | POA: Diagnosis not present

## 2020-05-23 DIAGNOSIS — R112 Nausea with vomiting, unspecified: Secondary | ICD-10-CM | POA: Diagnosis not present

## 2020-05-23 DIAGNOSIS — R0602 Shortness of breath: Secondary | ICD-10-CM | POA: Insufficient documentation

## 2020-05-23 DIAGNOSIS — H9190 Unspecified hearing loss, unspecified ear: Secondary | ICD-10-CM | POA: Diagnosis not present

## 2020-05-23 DIAGNOSIS — K429 Umbilical hernia without obstruction or gangrene: Secondary | ICD-10-CM | POA: Diagnosis not present

## 2020-05-23 DIAGNOSIS — J45909 Unspecified asthma, uncomplicated: Secondary | ICD-10-CM | POA: Insufficient documentation

## 2020-05-23 DIAGNOSIS — Z794 Long term (current) use of insulin: Secondary | ICD-10-CM | POA: Diagnosis not present

## 2020-05-23 DIAGNOSIS — K469 Unspecified abdominal hernia without obstruction or gangrene: Secondary | ICD-10-CM | POA: Diagnosis not present

## 2020-05-23 DIAGNOSIS — I10 Essential (primary) hypertension: Secondary | ICD-10-CM | POA: Insufficient documentation

## 2020-05-23 DIAGNOSIS — Z96642 Presence of left artificial hip joint: Secondary | ICD-10-CM | POA: Insufficient documentation

## 2020-05-23 DIAGNOSIS — R1013 Epigastric pain: Secondary | ICD-10-CM | POA: Insufficient documentation

## 2020-05-23 DIAGNOSIS — K219 Gastro-esophageal reflux disease without esophagitis: Secondary | ICD-10-CM | POA: Insufficient documentation

## 2020-05-23 DIAGNOSIS — K5730 Diverticulosis of large intestine without perforation or abscess without bleeding: Secondary | ICD-10-CM | POA: Diagnosis not present

## 2020-05-23 DIAGNOSIS — E1149 Type 2 diabetes mellitus with other diabetic neurological complication: Secondary | ICD-10-CM | POA: Diagnosis not present

## 2020-05-23 DIAGNOSIS — K439 Ventral hernia without obstruction or gangrene: Secondary | ICD-10-CM | POA: Diagnosis not present

## 2020-05-24 ENCOUNTER — Emergency Department (HOSPITAL_COMMUNITY)
Admission: EM | Admit: 2020-05-24 | Discharge: 2020-05-24 | Disposition: A | Discharge status: Home / Self Care | Payer: Federal, State, Local not specified - PPO | Attending: Emergency Medicine | Admitting: Emergency Medicine

## 2020-05-24 ENCOUNTER — Encounter (HOSPITAL_COMMUNITY): Payer: Self-pay

## 2020-05-24 ENCOUNTER — Emergency Department (HOSPITAL_COMMUNITY): Payer: Federal, State, Local not specified - PPO

## 2020-05-24 DIAGNOSIS — R0602 Shortness of breath: Secondary | ICD-10-CM | POA: Diagnosis not present

## 2020-05-24 DIAGNOSIS — R112 Nausea with vomiting, unspecified: Secondary | ICD-10-CM | POA: Diagnosis not present

## 2020-05-24 DIAGNOSIS — K439 Ventral hernia without obstruction or gangrene: Secondary | ICD-10-CM | POA: Diagnosis not present

## 2020-05-24 DIAGNOSIS — R7989 Other specified abnormal findings of blood chemistry: Secondary | ICD-10-CM | POA: Diagnosis not present

## 2020-05-24 DIAGNOSIS — K469 Unspecified abdominal hernia without obstruction or gangrene: Secondary | ICD-10-CM | POA: Diagnosis not present

## 2020-05-24 DIAGNOSIS — K5730 Diverticulosis of large intestine without perforation or abscess without bleeding: Secondary | ICD-10-CM | POA: Diagnosis not present

## 2020-05-24 DIAGNOSIS — K429 Umbilical hernia without obstruction or gangrene: Secondary | ICD-10-CM | POA: Diagnosis not present

## 2020-05-24 DIAGNOSIS — R000 Tachycardia, unspecified: Secondary | ICD-10-CM | POA: Diagnosis not present

## 2020-05-24 LAB — COMPREHENSIVE METABOLIC PANEL
ALT: 22 U/L (ref 0–44)
AST: 44 U/L — ABNORMAL HIGH (ref 15–41)
Albumin: 3.8 g/dL (ref 3.5–5.0)
Alkaline Phosphatase: 54 U/L (ref 38–126)
Anion gap: 13 (ref 5–15)
BUN: 18 mg/dL (ref 6–20)
CO2: 25 mmol/L (ref 22–32)
Calcium: 9.4 mg/dL (ref 8.9–10.3)
Chloride: 98 mmol/L (ref 98–111)
Creatinine, Ser: 1.38 mg/dL — ABNORMAL HIGH (ref 0.44–1.00)
GFR calc Af Amer: 49 mL/min — ABNORMAL LOW (ref >60)
GFR calc non Af Amer: 43 mL/min — ABNORMAL LOW (ref >60)
Glucose, Bld: 143 mg/dL — ABNORMAL HIGH (ref 70–99)
Potassium: 5.3 mmol/L — ABNORMAL HIGH (ref 3.5–5.1)
Sodium: 136 mmol/L (ref 135–145)
Total Bilirubin: 2.3 mg/dL — ABNORMAL HIGH (ref 0.3–1.2)
Total Protein: 8.1 g/dL (ref 6.5–8.1)

## 2020-05-24 LAB — CBC WITH DIFFERENTIAL/PLATELET
Abs Immature Granulocytes: 0.03 10*3/uL (ref 0.00–0.07)
Basophils Absolute: 0 10*3/uL (ref 0.0–0.1)
Basophils Relative: 0 %
Eosinophils Absolute: 0 10*3/uL (ref 0.0–0.5)
Eosinophils Relative: 0 %
HCT: 48.8 % — ABNORMAL HIGH (ref 36.0–46.0)
Hemoglobin: 15.8 g/dL — ABNORMAL HIGH (ref 12.0–15.0)
Immature Granulocytes: 1 %
Lymphocytes Relative: 24 %
Lymphs Abs: 1.5 10*3/uL (ref 0.7–4.0)
MCH: 27.8 pg (ref 26.0–34.0)
MCHC: 32.4 g/dL (ref 30.0–36.0)
MCV: 85.9 fL (ref 80.0–100.0)
Monocytes Absolute: 0.9 10*3/uL (ref 0.1–1.0)
Monocytes Relative: 14 %
Neutro Abs: 3.9 10*3/uL (ref 1.7–7.7)
Neutrophils Relative %: 61 %
Platelets: 192 10*3/uL (ref 150–400)
RBC: 5.68 MIL/uL — ABNORMAL HIGH (ref 3.87–5.11)
RDW: 13.7 % (ref 11.5–15.5)
WBC: 6.3 10*3/uL (ref 4.0–10.5)
nRBC: 0 % (ref 0.0–0.2)

## 2020-05-24 LAB — TROPONIN I (HIGH SENSITIVITY): Troponin I (High Sensitivity): 11 ng/L (ref <18)

## 2020-05-24 LAB — LIPASE, BLOOD: Lipase: 22 U/L (ref 11–51)

## 2020-05-24 MED ORDER — SODIUM CHLORIDE (PF) 0.9 % IJ SOLN
INTRAMUSCULAR | Status: AC
  Filled 2020-05-24: qty 50

## 2020-05-24 MED ORDER — ONDANSETRON 4 MG PO TBDP
4.0000 mg | ORAL_TABLET | Freq: Three times a day (TID) | ORAL | 0 refills | Status: DC | PRN
Start: 2020-05-24 — End: 2021-12-05

## 2020-05-24 MED ORDER — IOHEXOL 300 MG/ML  SOLN
100.0000 mL | Freq: Once | INTRAMUSCULAR | Status: AC | PRN
  Administered 2020-05-24: 100 mL via INTRAVENOUS

## 2020-05-24 MED ORDER — SODIUM CHLORIDE 0.9 % IV BOLUS
1000.0000 mL | Freq: Once | INTRAVENOUS | Status: AC
  Administered 2020-05-24: 1000 mL via INTRAVENOUS

## 2020-05-24 NOTE — ED Notes (Addendum)
Pt's son, Tomasa Rand, called to check on his mom's status. He can be reached at [phone] when ready for discharge.

## 2020-05-24 NOTE — Discharge Instructions (Addendum)
Your blood work showed your potassium was slightly elevated today as well as your kidney function. This is likely secondary to dehydration. You will need to follow-up with your primary care doctor within 1 week to have this rechecked. Please increase your water intake for the next few days. I have prescribed a nausea medication for you to take at home.   Your x-ray today did not show any signs of infection or pneumonia. Your antibiotic that was prescribed may be causing your upset stomach. It is okay to stop taking this medication given your xray was clear today.   Return to the ED IMMEDIATELY for any worsening symptoms including worsening abdominal pain, excessive vomiting, excessive diarrhea, fevers > 100.4, cough, worsening shortness of breath, or any other new/concerning symptoms.

## 2020-05-24 NOTE — ED Notes (Signed)
Patient's son has been contacted regarding patient transport. Will arrive in 15 minutes.

## 2020-05-24 NOTE — ED Provider Notes (Signed)
[facility] DEPT Provider Note   CSN: [id number] Arrival date & time: 05/23/20  2337     History Chief Complaint  Patient presents with  . Shortness of Breath    Rhonda Morrow is a 59 y.o. female with past medical history significant for arthritis, asthma, hypertension, thyroid disease.  Patient is deaf.  Abdominal surgical history includes cholecystectomy  HPI Patient presents to emergency department today with chief complaint of shortness of breath and emesis x1 day.  She states she was recently diagnosed with pneumonia at urgent care.  She was prescribed an antibiotic and she thinks it caused her to have an upset stomach.  She has had 3 episodes of nonbloody nonbilious emesis prior to arrival.  Patient states after the most recent episode of emesis she felt much improved.  Her shortness of breath had resolved.  She is unsure of antibiotic name, she thinks it begins with an "L".  She states she has had x 2 days of the antibiotic. She describes the upset stomach as a dull aching sensation that radiates through entire abdomen. She has not had any diarrhea. She tried to use her albuterol inhaler today without any symptom improvement.  No other medications tried prior to arrival.  She denies any fever, chills, cough, hemoptysis, chest pain, back pain, pelvic pain, vaginal discharge, abnormal vaginal bleeding, bloody stool, gross hematuria, urinary frequency, dysuria.   Due to language barrier, a video interpreter was present during the history-taking and subsequent discussion (and for part of the physical exam) with this patient.     Past Medical History:  Diagnosis Date  . Arthritis   . Asthma   . Complication of anesthesia    one time woke up and was vey scared,17 yrs ago  . Deaf   . Diabetes mellitus without complication ([place])   . GERD (gastroesophageal reflux disease)   . Hypertension   . Left groin pain   . Thyroid disease     Patient Active  Problem List   Diagnosis Date Noted  . Restless leg syndrome 05/17/2020  . Cough 04/01/2020  . Moderate persistent asthma without complication 11/08/7251  . Medication management 04/01/2020  . Menopausal symptoms 03/29/2020  . Hot flashes 03/29/2020  . Chronic right shoulder pain 08/11/2019  . Acute non-recurrent maxillary sinusitis 08/11/2019  . BMI 40.0-44.9, adult ([place]) 01/22/2019  . Right lower quadrant abdominal pain 10/20/2018  . Rectal pain 10/20/2018  . Vitamin B 12 deficiency 03/28/2018  . B12 deficiency 03/28/2018  . DM (diabetes mellitus) type II uncontrolled, periph vascular disorder ([place]) 03/28/2018  . Hyperlipidemia 03/28/2018  . Primary osteoarthritis of left hip 02/21/2017  . 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 History of laparoscopic cholecystectomy 06/20/2016  . Cerumen impaction 06/09/2016  . Hypersomnia 05/16/2016  . Knee pain, right 05/05/2016  . RUQ pain 04/09/2016  . Abnormal CT scan 03/28/2016  . Dyspnea and respiratory abnormalities 03/15/2016  . Asthma with acute exacerbation 03/15/2016  . Asthma in adult 02/25/2016  . DM (diabetes mellitus) type II controlled, neurological manifestation ([place]) 02/09/2016  . Left hip pain 12/23/2015  . Right hamstring muscle strain 11/18/2015  . Abdominal pain, acute 09/01/2015  . Acute asthma exacerbation 04/16/2015  . Acute bronchitis 04/14/2015  . Vaginal discharge 09/14/2014  . Pap smear for cervical cancer screening 09/14/2014  . Scabies 05/06/2014  . Bed bug bite 05/06/2014  . Allergic rhinitis 04/09/2014  . Rectal itching 04/09/2014  . Bladder spasm 04/09/2014  .  Knee pain, left 03/05/2014  . Hypokalemia 02/19/2014  . Nausea with vomiting 02/19/2014  . Diabetes mellitus, type II ([place]) 02/19/2014  . Edema 02/16/2014  . Disorder of rotator cuff 11/12/2013  . Routine general medical examination at a health care facility 08/20/2013  . GERD (gastroesophageal reflux disease) 06/26/2013  .  Dysphagia, pharyngoesophageal phase 06/26/2013  . Suprapubic pain 05/12/2013  . Medication side effect 03/25/2013  . Diarrhea 03/25/2013  . Benign positional vertigo 01/30/2013  . Traumatic hematoma of thigh 01/15/2013  . HTN (hypertension) 09/02/2012  . Dry skin 08/22/2012  . External hemorrhoids 08/22/2012  . Obesity 06/30/2012  . Thyromegaly 05/22/2012  . Back pain 05/22/2012  . Elevated glucose 05/22/2012  . Moderate persistent asthma 04/19/2012  . Dyspnea 01/28/2012    Past Surgical History:  Procedure Laterality Date  . CARDIAC CATHETERIZATION    . CESAREAN SECTION    . CHOLECYSTECTOMY N/A 06/20/2016   Procedure: LAPAROSCOPIC CHOLECYSTECTOMY;  Surgeon: Rolm Bookbinder, MD;  Location: [facility];  Service: General;  Laterality: N/A;  . ESOPHAGEAL MANOMETRY N/A 09/29/2013   Procedure: ESOPHAGEAL MANOMETRY (EM);  Surgeon: Milus Banister, MD;  Location: [facility];  Service: Endoscopy;  Laterality: N/A;  . TOTAL HIP ARTHROPLASTY Left 02/21/2017   Procedure: LEFT TOTAL HIP ARTHROPLASTY ANTERIOR APPROACH;  Surgeon: Leandrew Koyanagi, MD;  Location: [facility];  Service: Orthopedics;  Laterality: Left;     OB History    Gravida  3   Para      Term      Preterm      AB      Living  3     SAB      TAB      Ectopic      Multiple      Live Births              Family History  Problem Relation Age of Onset  . Diabetes Mother   . Heart disease Mother   . Diabetes Father     Social History   Tobacco Use  . Smoking status: Never Smoker  . Smokeless tobacco: Never Used  Vaping Use  . Vaping Use: Never used  Substance Use Topics  . Alcohol use: No    Alcohol/week: 0.0 standard drinks  . Drug use: No    Home Medications Prior to Admission medications   Medication Sig Start Date End Date Taking? Authorizing Provider  allopurinol (ZYLOPRIM) 100 MG tablet Take 1 tablet (100 mg total) by mouth daily. 05/17/20   [name] R, DO  amLODipine (NORVASC) 5 MG  tablet Take 1 tablet (5 mg total) by mouth daily. 02/25/20   Ann Held, DO  aspirin EC 325 MG tablet Take 1 tablet (325 mg total) by mouth 2 (two) times daily. 02/21/17   Leandrew Koyanagi, MD  azelastine (ASTELIN) 0.1 % nasal spray Place 2 sprays into both nostrils 2 (two) times daily. Use in each nostril as directed 09/01/16   Carollee Herter, Alferd Apa, DO  benzonatate (TESSALON) 200 MG capsule Take 1 capsule (200 mg total) by mouth 3 (three) times daily as needed for cough. 04/01/20   Lauraine Rinne, NP  Calcium Carbonate-Vit D-Min (CALCIUM 1200) 1200-1000 MG-UNIT CHEW 1 po qd 03/29/20   Carollee Herter, Alferd Apa, DO  cefdinir (OMNICEF) 300 MG capsule Take 1 capsule (300 mg total) by mouth 2 (two) times daily. 04/08/20   Ann Held, DO  cetirizine (ZYRTEC) 10 MG tablet  TAKE 1 TABLET (10 MG TOTAL) BY MOUTH DAILY. 02/25/20   Ann Held, DO  colchicine 0.6 MG tablet Take 1 tablet (0.6 mg total) by mouth daily. 05/30/19   [name] R, DO  Cyanocobalamin (B-12) 1000 MCG SUBL Dissolove 1 tablet under tongue once a day 04/01/19   Carollee Herter, Alferd Apa, DO  cyclobenzaprine (FLEXERIL) 10 MG tablet TAKE 1 TABLET BY MOUTH THREE TIMES A DAY AS NEEDED FOR MUSCLE SPASMS 02/25/20   Carollee Herter, Alferd Apa, DO  diclofenac (VOLTAREN) 75 MG EC tablet TAKE 1 TABLET (75 MG TOTAL) BY MOUTH 2 (TWO) TIMES DAILY AS NEEDED. 10/27/19   Hilts, Legrand Como, MD  diclofenac sodium (VOLTAREN) 1 % GEL Apply 4 g topically 4 (four) times daily as needed. 11/19/18   Hilts, Legrand Como, MD  Fiber CHEW Chew 1 tablet by mouth daily.    [provider]  fluticasone (FLONASE) 50 MCG/ACT nasal spray Place 2 sprays into both nostrils daily. 08/11/19   Carollee Herter, Yvonne R, DO  fluticasone (FLONASE) 50 MCG/ACT nasal spray PLACE 2 SPRAYS INTO BOTH NOSTRILS DAILY. 02/25/20   [name] R, DO  glucose blood test strip Use as instructed--- one touch verio test strips 02/25/20   Lowne Chase, Alferd Apa, DO    hydrochlorothiazide (HYDRODIURIL) 12.5 MG tablet Take 1 tablet (12.5 mg total) by mouth daily. 02/25/20   [name] R, DO  hydrOXYzine (ATARAX/VISTARIL) 10 MG tablet TAKE 1 TO 2 TABLETS BY MOUTH AT BEDTIME AS NEEDED FOR ITCHING 02/25/20   Lowne Chase, Alferd Apa, DO  ipratropium-albuterol (DUONEB) 0.5-2.5 (3) MG/3ML SOLN Take 3 mLs by nebulization every 6 (six) hours as needed. 11/21/18   Ann Held, DO  Lancets [facility] ULTRASOFT) lancets Use as instructed 02/25/20   Carollee Herter, Alferd Apa, DO  meclizine (ANTIVERT) 12.5 MG tablet Take 1 tablet (12.5 mg total) by mouth 3 (three) times daily as needed for dizziness. 12/19/17   Saguier, Percell Miller, PA-C  metaxalone (SKELAXIN) 800 MG tablet TAKE 1 TABLET BY MOUTH THREE TIMES A DAY Patient taking differently: Take 800 mg by mouth 3 (three) times daily.  10/03/17   Ann Held, DO  metFORMIN (GLUCOPHAGE-XR) 500 MG 24 hr tablet Take 1 tablet (500 mg total) by mouth daily with breakfast. 02/25/20   Carollee Herter, Alferd Apa, DO  mometasone-formoterol (DULERA) 100-5 MCG/ACT AERO Inhale 2 puffs into the lungs 2 (two) times daily. 11/21/18   [name] R, DO  montelukast (SINGULAIR) 10 MG tablet Take 1 tablet (10 mg total) by mouth at bedtime. 11/21/18   Ann Held, DO  Multiple Vitamin (MULTIVITAMIN) tablet Take 1 tablet by mouth daily.    [provider]  NONFORMULARY OR COMPOUNDED ITEM Nebulizer   DX ASTHMA 02/25/16   Carollee Herter, Alferd Apa, DO  nystatin-triamcinolone [facility] II) cream Apply twice daily to lower chest area/upper abdomen 05/12/19   Saguier, Percell Miller, PA-C  ofloxacin (FLOXIN OTIC) 0.3 % OTIC solution Place 10 drops into the right ear daily. 04/08/20   [name] R, DO  ondansetron (ZOFRAN ODT) 4 MG disintegrating tablet Take 1 tablet (4 mg total) by mouth every 8 (eight) hours as needed for nausea or vomiting. 11/17/18   Carlisle Cater, PA-C  pantoprazole (PROTONIX) 40 MG tablet Take 1 tablet (40 mg  total) by mouth daily. 01/06/19   Ann Held, DO  potassium chloride SA (KLOR-CON M20) 20 MEQ tablet Take 1 tablet (20 mEq  total) by mouth daily. 02/25/20   Ann Held, DO  ranitidine (ZANTAC) 150 MG tablet TAKE 1 TABLET BY MOUTH TWICE A DAY Patient taking differently: Take 150 mg by mouth 2 (two) times daily.  12/31/17   Ann Held, DO  rOPINIRole (REQUIP) 0.25 MG tablet 1 po qhs x 4 days then can increase to 2 at night if needed 05/17/20   Carollee Herter, Alferd Apa, DO  Spacer/Aero-Holding Chambers (AEROCHAMBER MV) inhaler Use as instructed 11/26/18   Icard, Octavio Graves, DO  SYMBICORT 160-4.5 MCG/ACT inhaler Inhale 2 puffs into the lungs every 12 (twelve) hours. 04/01/20   Lauraine Rinne, NP  traMADol (ULTRAM) 50 MG tablet TAKE 1 TABLET (50 MG TOTAL) BY MOUTH EVERY 8 (EIGHT) HOURS AS NEEDED FOR UP TO 5 DAYS. 07/29/19   Carollee Herter, Alferd Apa, DO  triamcinolone cream (KENALOG) 0.1 % Apply bid to hands 05/12/19   Saguier, Percell Miller, PA-C  VENTOLIN HFA 108 (90 Base) MCG/ACT inhaler Inhale 2 puffs into the lungs every 6 (six) hours as needed for wheezing or shortness of breath. 04/01/20   Lauraine Rinne, NP    Allergies    Losartan, Oxycodone-acetaminophen, and Augmentin [amoxicillin-pot clavulanate]  Review of Systems   Review of Systems All other systems are reviewed and are negative for acute change except as noted in the HPI.  Physical Exam Updated Vital Signs BP 139/71 (BP Location: Right Arm)   Pulse 83   Temp 98.1 F (36.7 C) (Oral)   Resp 16   LMP 05/20/2012   SpO2 98%   Physical Exam Vitals and nursing note reviewed.  Constitutional:      General: She is not in acute distress.    Appearance: She is not ill-appearing.  HENT:     Head: Normocephalic and atraumatic.     Right Ear: Tympanic membrane and external ear normal.     Left Ear: Tympanic membrane and external ear normal.     Nose: Nose normal.     Mouth/Throat:     Mouth: Mucous membranes are moist.      Pharynx: Oropharynx is clear.  Eyes:     General: No scleral icterus.       Right eye: No discharge.        Left eye: No discharge.     Extraocular Movements: Extraocular movements intact.     Conjunctiva/sclera: Conjunctivae normal.     Pupils: Pupils are equal, round, and reactive to light.  Neck:     Vascular: No JVD.  Cardiovascular:     Rate and Rhythm: Normal rate and regular rhythm.     Pulses: Normal pulses.          Radial pulses are 2+ on the right side and 2+ on the left side.     Heart sounds: Normal heart sounds.  Pulmonary:     Comments: Lungs clear to auscultation in all fields. Symmetric chest rise. No wheezing, rales, or rhonchi.  Oxygen saturation is 99% on room air. Abdominal:     General: Bowel sounds are normal.     Tenderness: There is no right CVA tenderness or left CVA tenderness.     Comments: Abdomen is soft, non-distended, tender to palpation of epigastric area. No rigidity, no guarding. No peritoneal signs.  Musculoskeletal:        General: Normal range of motion.     Cervical back: Normal range of motion.  Skin:    General: Skin is warm and dry.  Capillary Refill: Capillary refill takes less than 2 seconds.  Neurological:     Mental Status: She is oriented to person, place, and time.     GCS: GCS eye subscore is 4. GCS verbal subscore is 5. GCS motor subscore is 6.     Comments: Fluent speech, no facial droop.  Psychiatric:        Mood and Affect: Mood is anxious.        Behavior: Behavior normal.     ED Results / Procedures / Treatments   Labs (all labs ordered are listed, but only abnormal results are displayed) Labs Reviewed  COMPREHENSIVE METABOLIC PANEL - Abnormal; Notable for the following components:      Result Value   Potassium 5.3 (*)    Glucose, Bld 143 (*)    Creatinine, Ser 1.38 (*)    AST 44 (*)    Total Bilirubin 2.3 (*)    GFR calc non Af Amer 43 (*)    GFR calc Af Amer 49 (*)    All other components within normal  limits  CBC WITH DIFFERENTIAL/PLATELET - Abnormal; Notable for the following components:   RBC 5.68 (*)    Hemoglobin 15.8 (*)    HCT 48.8 (*)    All other components within normal limits  LIPASE, BLOOD  TROPONIN I (HIGH SENSITIVITY)    EKG EKG Interpretation  Date/Time:  Monday May 24 2020 00:23:22 EDT Ventricular Rate:  108 PR Interval:    QRS Duration: 94 QT Interval:  344 QTC Calculation: 462 R Axis:   -74 Text Interpretation: Sinus tachycardia Multiple ventricular premature complexes Left anterior fascicular block Low voltage, precordial leads Abnormal R-wave progression, late transition Borderline T abnormalities, inferior leads Baseline wander in lead(s) aVL V3 V6 12 Lead; [place] Confirmed by Merrily Pew [phone]) on 05/24/2020 5:33:55 AM   Radiology DG Chest 2 View  Result Date: 05/24/2020 CLINICAL DATA:  Shortness of breath EXAM: CHEST - 2 VIEW COMPARISON:  07/31/2019 FINDINGS: The heart size and mediastinal contours are within normal limits. Both lungs are clear. The visualized skeletal structures are unremarkable. IMPRESSION: No active cardiopulmonary disease. Electronically Signed   By: Constance Holster M.D.   On: 05/24/2020 00:43    Procedures Procedures (including critical care time)  Medications Ordered in ED Medications  sodium chloride 0.9 % bolus 1,000 mL (1,000 mLs Intravenous New Bag/Given 05/24/20 0546)    ED Course  I have reviewed the triage vital signs and the nursing notes.  Pertinent labs & imaging results that were available during my care of the patient were reviewed by me and considered in my medical decision making (see chart for details).    MDM Rules/Calculators/A&P                          History provided by patient with additional history obtained from chart review.    59 yo female presenting with shortness of breath and emesis. She is afebrile, normotensive.  She was noted to be slightly tachycardic to 103 in triage.  No  hypoxia.  RN tells me patient had an episode of emesis in triage and afterwards said her shortness of breath had resolved, patient appeared anxious at that time.   During my exam patient is again anxious appearing.  She has intermittent periods of hyperventilation however is able to be calmed down when being reminded to take deep breaths.  Her lungs are clear to auscultation all fields. She has mild  UQ abdominal tenderness, no peritoneal signs.  No CVA tenderness.  I viewed pt's chest xray and it does not suggest acute infectious processes.  With the chest x-ray and reassuring lung exam patient clinically does not appear to have pneumonia. CBC with no leukocytosis, does appear to be hemoconcentrated suggesting dehydration. CMP with slight hyperkalemia potassium of 5.3, creatinine slightly elevated at 1.38, baseline appears to be around 0.8.  Total bilirubin is also elevated at 2.3.  Given patient has right upper quadrant tenderness and elevated bili will order CT A/P. Presentation not consistent with ACS.  Patient care transferred to [facility]. [place] PA-C at the end of my shift pending CT A/P, lipase, troponin. Patient presentation, ED course, and plan of care discussed with review of all pertinent labs and imaging. Please see her note for further details regarding further ED course and disposition.  If imaging and remaining labs are unremarkable anticipate discharge home. Patient will need to follow up outpatient to have potassium rechecked.   Portions of this note were generated with [employer]. Dictation errors may occur despite best attempts at proofreading.   Final Clinical Impression(s) / ED Diagnoses Final diagnoses:  Non-intractable vomiting with nausea, unspecified vomiting type    Rx / DC Orders ED Discharge Orders    None       Cherre Robins, PA-C 05/24/20 [id number]    Merrily Pew, MD 05/24/20 [phone]

## 2020-05-24 NOTE — ED Provider Notes (Signed)
Care assumed from [facility], PA-C, at shift change, please see their notes for full documentation of patient's complaint/HPI. Briefly, pt here with complaint of epigastric abdominal pain, nausea, vomiting, and SOB. Results so far show elevated T bili and elevated creatinine; negative CXR. Awaiting lipase, troponin, and CT A/P. Plan is to dispo accordingly if all labwork unremarkable.   Physical Exam  BP (!) 143/56   Pulse 81   Temp 98.1 F (36.7 C) (Oral)   Resp 18   LMP 05/20/2012   SpO2 100%   Physical Exam Vitals and nursing note reviewed.  Constitutional:      Appearance: She is not ill-appearing.  HENT:     Head: Normocephalic and atraumatic.  Eyes:     Conjunctiva/sclera: Conjunctivae normal.  Cardiovascular:     Rate and Rhythm: Normal rate and regular rhythm.  Pulmonary:     Effort: Pulmonary effort is normal.     Breath sounds: Normal breath sounds.  Skin:    General: Skin is warm and dry.     Coloration: Skin is not jaundiced.  Neurological:     Mental Status: She is alert.     ED Course/Procedures     Procedures  Results for orders placed or performed during the hospital encounter of 05/24/20  Comprehensive metabolic panel  Result Value Ref Range   Sodium 136 135 - 145 mmol/L   Potassium 5.3 (H) 3.5 - 5.1 mmol/L   Chloride 98 98 - 111 mmol/L   CO2 25 22 - 32 mmol/L   Glucose, Bld 143 (H) 70 - 99 mg/dL   BUN 18 6 - 20 mg/dL   Creatinine, Ser 1.38 (H) 0.44 - 1.00 mg/dL   Calcium 9.4 8.9 - 10.3 mg/dL   Total Protein 8.1 6.5 - 8.1 g/dL   Albumin 3.8 3.5 - 5.0 g/dL   AST 44 (H) 15 - 41 U/L   ALT 22 0 - 44 U/L   Alkaline Phosphatase 54 38 - 126 U/L   Total Bilirubin 2.3 (H) 0.3 - 1.2 mg/dL   GFR calc non Af Amer 43 (L) >60 mL/min   GFR calc Af Amer 49 (L) >60 mL/min   Anion gap 13 5 - 15  CBC with Differential  Result Value Ref Range   WBC 6.3 4.0 - 10.5 K/uL   RBC 5.68 (H) 3.87 - 5.11 MIL/uL   Hemoglobin 15.8 (H) 12.0 - 15.0 g/dL   HCT 48.8 (H)  36 - 46 %   MCV 85.9 80.0 - 100.0 fL   MCH 27.8 26.0 - 34.0 pg   MCHC 32.4 30.0 - 36.0 g/dL   RDW 13.7 11.5 - 15.5 %   Platelets 192 150 - 400 K/uL   nRBC 0.0 0.0 - 0.2 %   Neutrophils Relative % 61 %   Neutro Abs 3.9 1.7 - 7.7 K/uL   Lymphocytes Relative 24 %   Lymphs Abs 1.5 0.7 - 4.0 K/uL   Monocytes Relative 14 %   Monocytes Absolute 0.9 0 - 1 K/uL   Eosinophils Relative 0 %   Eosinophils Absolute 0.0 0 - 0 K/uL   Basophils Relative 0 %   Basophils Absolute 0.0 0 - 0 K/uL   Immature Granulocytes 1 %   Abs Immature Granulocytes 0.03 0.00 - 0.07 K/uL  Lipase, blood  Result Value Ref Range   Lipase 22 11 - 51 U/L  Troponin I (High Sensitivity)  Result Value Ref Range   Troponin I (High Sensitivity) 11 <18 ng/L  DG Chest 2 View  Result Date: 05/24/2020 CLINICAL DATA:  Shortness of breath EXAM: CHEST - 2 VIEW COMPARISON:  07/31/2019 FINDINGS: The heart size and mediastinal contours are within normal limits. Both lungs are clear. The visualized skeletal structures are unremarkable. IMPRESSION: No active cardiopulmonary disease. Electronically Signed   By: Constance Holster M.D.   On: 05/24/2020 00:43   CT ABDOMEN PELVIS W CONTRAST  Result Date: 05/24/2020 CLINICAL DATA:  Epigastric abdominal pain, shortness of breath and vomiting. EXAM: CT ABDOMEN AND PELVIS WITH CONTRAST TECHNIQUE: Multidetector CT imaging of the abdomen and pelvis was performed using the standard protocol following bolus administration of intravenous contrast. CONTRAST:  143mL OMNIPAQUE IOHEXOL 300 MG/ML  SOLN COMPARISON:  CT scan 12/15/2019 FINDINGS: Lower chest: Mild cardiac enlargement is stable. No pericardial effusion. The lung bases are clear. Hepatobiliary: No hepatic lesions or intrahepatic biliary dilatation. The gallbladder is surgically absent. No common bile duct dilatation. Pancreas: No mass, inflammation or ductal dilatation. Spleen: Normal size. No focal lesions. Adrenals/Urinary Tract: The adrenal  glands and kidneys are unremarkable. The bladder is unremarkable. Stomach/Bowel: The stomach, duodenum, small bowel and colon are grossly normal without oral contrast. No acute inflammatory changes, mass lesions or obstructive findings. The terminal ileum and appendix are normal. Scattered colonic diverticulosis but no findings for acute diverticulitis. Vascular/Lymphatic: The aorta is normal in caliber. No dissection. The branch vessels are patent. The major venous structures are patent. No mesenteric or retroperitoneal mass or adenopathy. Small scattered lymph nodes are noted. Reproductive: The uterus and ovaries are normal. Other: No pelvic mass or adenopathy. No free pelvic fluid collections. No inguinal mass or adenopathy. Small periumbilical abdominal wall hernia containing fat. No inguinal hernia. Musculoskeletal: No significant bony findings. Left total hip arthroplasty is noted. No complicating features are identified. Moderate degenerative changes involving the right hip. IMPRESSION: 1. No acute abdominal/pelvic findings, mass lesions or adenopathy. 2. Status post cholecystectomy. No biliary dilatation. 3. Small periumbilical abdominal wall hernia containing fat. Electronically Signed   By: Marijo Sanes M.D.   On: 05/24/2020 07:28    MDM  ASL interpretor used; went over results with patient including negative CT scan. Will plan to discharge home at this time with zofran PRN for nausea. Pt instructed to increase oral water intake over the next week and to have kidney function and potassium rechecked. She is to discontinue abx as this may  Be causing her upset stomach. Pt is to follow up with PCP. She is in agreement with plan and stable for discharge home.   This note was prepared using Dragon voice recognition software and may include unintentional dictation errors due to the inherent limitations of voice recognition software.       Eustaquio Maize, PA-C 05/24/20 [id number]    Merrily Pew,  MD 05/24/20 [phone]

## 2020-05-24 NOTE — ED Triage Notes (Signed)
Patient arrived stating she has had some shortness of breath and vomiting that started today. Report taking her inhaler with little relief.

## 2020-05-30 ENCOUNTER — Other Ambulatory Visit: Payer: Self-pay | Admitting: Family Medicine

## 2020-05-30 DIAGNOSIS — J301 Allergic rhinitis due to pollen: Secondary | ICD-10-CM

## 2020-06-01 ENCOUNTER — Encounter: Payer: Self-pay | Admitting: Family Medicine

## 2020-06-01 ENCOUNTER — Ambulatory Visit (HOSPITAL_BASED_OUTPATIENT_CLINIC_OR_DEPARTMENT_OTHER)
Admission: RE | Admit: 2020-06-01 | Discharge: 2020-06-01 | Disposition: A | Discharge status: Home / Self Care | Payer: Federal, State, Local not specified - PPO | Source: Ambulatory Visit | Attending: Family Medicine | Admitting: Family Medicine

## 2020-06-01 ENCOUNTER — Other Ambulatory Visit: Payer: Self-pay

## 2020-06-01 ENCOUNTER — Ambulatory Visit (INDEPENDENT_AMBULATORY_CARE_PROVIDER_SITE_OTHER): Payer: Federal, State, Local not specified - PPO | Admitting: Family Medicine

## 2020-06-01 VITALS — BP 122/80 | HR 68 | Temp 98.3°F | Resp 17 | Ht 62.0 in | Wt 230.0 lb

## 2020-06-01 DIAGNOSIS — R05 Cough: Secondary | ICD-10-CM | POA: Diagnosis not present

## 2020-06-01 DIAGNOSIS — R059 Cough, unspecified: Secondary | ICD-10-CM

## 2020-06-01 DIAGNOSIS — R1013 Epigastric pain: Secondary | ICD-10-CM

## 2020-06-01 DIAGNOSIS — R1110 Vomiting, unspecified: Secondary | ICD-10-CM | POA: Diagnosis not present

## 2020-06-01 LAB — POC URINALSYSI DIPSTICK (AUTOMATED)
Blood, UA: NEGATIVE
Glucose, UA: NEGATIVE
Ketones, UA: NEGATIVE
Leukocytes, UA: NEGATIVE
Nitrite, UA: NEGATIVE
Protein, UA: POSITIVE — AB
Spec Grav, UA: 1.02 (ref 1.010–1.025)
Urobilinogen, UA: 0.2 E.U./dL
pH, UA: 6 (ref 5.0–8.0)

## 2020-06-01 MED ORDER — CLARITHROMYCIN ER 500 MG PO TB24: 1000.0000 mg | ORAL_TABLET | Freq: Every day | ORAL | 0 refills | Status: DC

## 2020-06-01 MED ORDER — OMEPRAZOLE 20 MG PO CPDR: 20.0000 mg | DELAYED_RELEASE_CAPSULE | Freq: Every day | ORAL | 3 refills | Status: DC

## 2020-06-01 NOTE — Progress Notes (Signed)
Patient ID: Rhonda Morrow, female    DOB: 01/09/1961  Age: 59 y.o. MRN: [id number]    Subjective:  Subjective  HPI Rhonda Morrow presents for s/p er visit for nausea / vomiting and abd pain   She first went to the UC with congestion / cough and vomiting and covid was neg---they thought it might be pneumonia on 7/15 .   She progressively worsened and she went to ER   Interpreter is present  She threw up levaquin x2.   She still c/o midepigastric pain ---  No vomiting but is still having productive cough     No fevers   Review of Systems  Constitutional: Negative for activity change, appetite change, diaphoresis, fatigue and unexpected weight change.  Eyes: Negative for pain, redness and visual disturbance.  Respiratory: Negative for cough, chest tightness, shortness of breath and wheezing.   Cardiovascular: Negative for chest pain, palpitations and leg swelling.  Endocrine: Negative for cold intolerance, heat intolerance, polydipsia, polyphagia and polyuria.  Genitourinary: Negative for difficulty urinating, dysuria and frequency.  Neurological: Negative for dizziness, light-headedness, numbness and headaches.  Psychiatric/Behavioral: Negative for behavioral problems and dysphoric mood. The patient is not nervous/anxious.     History Past Medical History:  Diagnosis Date  . Arthritis   . Asthma   . Complication of anesthesia    one time woke up and was vey scared,17 yrs ago  . Deaf   . Diabetes mellitus without complication ([place])   . GERD (gastroesophageal reflux disease)   . Hypertension   . Left groin pain   . Thyroid disease     She has a past surgical history that includes Cesarean section; Cardiac catheterization; Esophageal manometry (N/A, 09/29/2013); Cholecystectomy (N/A, 06/20/2016); and Total hip arthroplasty (Left, 02/21/2017).   Her family history includes Diabetes in her father and mother; Heart disease in her mother.She reports that she has never smoked. She has never  used smokeless tobacco. She reports that she does not drink alcohol and does not use drugs.  Current Outpatient Medications on File Prior to Visit  Medication Sig Dispense Refill  . allopurinol (ZYLOPRIM) 100 MG tablet Take 1 tablet (100 mg total) by mouth daily. 90 tablet 3  . amLODipine (NORVASC) 5 MG tablet Take 1 tablet (5 mg total) by mouth daily. 90 tablet 1  . aspirin EC 325 MG tablet Take 1 tablet (325 mg total) by mouth 2 (two) times daily. 84 tablet 0  . azelastine (ASTELIN) 0.1 % nasal spray Place 2 sprays into both nostrils 2 (two) times daily. Use in each nostril as directed 30 mL 3  . benzonatate (TESSALON) 200 MG capsule Take 1 capsule (200 mg total) by mouth 3 (three) times daily as needed for cough. 30 capsule 1  . Calcium Carbonate-Vit D-Min (CALCIUM 1200) 1200-1000 MG-UNIT CHEW 1 po qd 30 tablet 11  . cefdinir (OMNICEF) 300 MG capsule Take 1 capsule (300 mg total) by mouth 2 (two) times daily. 20 capsule 0  . cetirizine (ZYRTEC) 10 MG tablet TAKE 1 TABLET (10 MG TOTAL) BY MOUTH DAILY. 90 tablet 1  . colchicine 0.6 MG tablet Take 1 tablet (0.6 mg total) by mouth daily. 90 tablet 1  . Cyanocobalamin (B-12) 1000 MCG SUBL Dissolove 1 tablet under tongue once a day 90 each 1  . cyclobenzaprine (FLEXERIL) 10 MG tablet TAKE 1 TABLET BY MOUTH THREE TIMES A DAY AS NEEDED FOR MUSCLE SPASMS 30 tablet 2  . diclofenac (VOLTAREN) 75 MG EC tablet TAKE  1 TABLET (75 MG TOTAL) BY MOUTH 2 (TWO) TIMES DAILY AS NEEDED. 60 tablet 3  . diclofenac sodium (VOLTAREN) 1 % GEL Apply 4 g topically 4 (four) times daily as needed. 500 g 6  . Fiber CHEW Chew 1 tablet by mouth daily.    . fluticasone (FLONASE) 50 MCG/ACT nasal spray Place 2 sprays into both nostrils daily. 16 g 6  . fluticasone (FLONASE) 50 MCG/ACT nasal spray SPRAY 2 SPRAYS INTO EACH NOSTRIL EVERY DAY 48 mL 1  . glucose blood test strip Use as instructed--- one touch verio test strips 100 each 12  . hydrochlorothiazide (HYDRODIURIL) 12.5 MG  tablet Take 1 tablet (12.5 mg total) by mouth daily. 90 tablet 1  . hydrOXYzine (ATARAX/VISTARIL) 10 MG tablet TAKE 1 TO 2 TABLETS BY MOUTH AT BEDTIME AS NEEDED FOR ITCHING 180 tablet 1  . ipratropium-albuterol (DUONEB) 0.5-2.5 (3) MG/3ML SOLN Take 3 mLs by nebulization every 6 (six) hours as needed. 360 mL 3  . Lancets (ONETOUCH ULTRASOFT) lancets Use as instructed 100 each 12  . meclizine (ANTIVERT) 12.5 MG tablet Take 1 tablet (12.5 mg total) by mouth 3 (three) times daily as needed for dizziness. 30 tablet 0  . metaxalone (SKELAXIN) 800 MG tablet TAKE 1 TABLET BY MOUTH THREE TIMES A DAY (Patient taking differently: Take 800 mg by mouth 3 (three) times daily. ) 30 tablet 2  . metFORMIN (GLUCOPHAGE-XR) 500 MG 24 hr tablet Take 1 tablet (500 mg total) by mouth daily with breakfast. 90 tablet 1  . mometasone-formoterol (DULERA) 100-5 MCG/ACT AERO Inhale 2 puffs into the lungs 2 (two) times daily. 1 Inhaler PRN  . montelukast (SINGULAIR) 10 MG tablet Take 1 tablet (10 mg total) by mouth at bedtime. 90 tablet 3  . Multiple Vitamin (MULTIVITAMIN) tablet Take 1 tablet by mouth daily.    . NONFORMULARY OR COMPOUNDED ITEM Nebulizer   DX ASTHMA 1 each 0  . nystatin-triamcinolone (MYCOLOG II) cream Apply twice daily to lower chest area/upper abdomen 30 g 0  . ofloxacin (FLOXIN OTIC) 0.3 % OTIC solution Place 10 drops into the right ear daily. 10 mL 0  . ondansetron (ZOFRAN ODT) 4 MG disintegrating tablet Take 1 tablet (4 mg total) by mouth every 8 (eight) hours as needed for nausea or vomiting. 20 tablet 0  . pantoprazole (PROTONIX) 40 MG tablet Take 1 tablet (40 mg total) by mouth daily. 30 tablet 3  . potassium chloride SA (KLOR-CON M20) 20 MEQ tablet Take 1 tablet (20 mEq total) by mouth daily. 90 tablet 1  . ranitidine (ZANTAC) 150 MG tablet TAKE 1 TABLET BY MOUTH TWICE A DAY (Patient taking differently: Take 150 mg by mouth 2 (two) times daily. ) 180 tablet 1  . rOPINIRole (REQUIP) 0.25 MG tablet 1 po  qhs x 4 days then can increase to 2 at night if needed 60 tablet 0  . Spacer/Aero-Holding Chambers (AEROCHAMBER MV) inhaler Use as instructed 1 each 0  . SYMBICORT 160-4.5 MCG/ACT inhaler Inhale 2 puffs into the lungs every 12 (twelve) hours. 1 Inhaler 5  . traMADol (ULTRAM) 50 MG tablet TAKE 1 TABLET (50 MG TOTAL) BY MOUTH EVERY 8 (EIGHT) HOURS AS NEEDED FOR UP TO 5 DAYS. 16 tablet 0  . triamcinolone cream (KENALOG) 0.1 % Apply bid to hands 30 g 0  . VENTOLIN HFA 108 (90 Base) MCG/ACT inhaler Inhale 2 puffs into the lungs every 6 (six) hours as needed for wheezing or shortness of breath. 18 g 5  No current facility-administered medications on file prior to visit.     Objective:  Objective  Physical Exam Vitals and nursing note reviewed.  Constitutional:      Appearance: She is well-developed.  HENT:     Head: Normocephalic and atraumatic.  Eyes:     Conjunctiva/sclera: Conjunctivae normal.  Neck:     Thyroid: No thyromegaly.     Vascular: No carotid bruit or JVD.  Cardiovascular:     Rate and Rhythm: Normal rate and regular rhythm.     Heart sounds: Normal heart sounds. No murmur heard.   Pulmonary:     Effort: Pulmonary effort is normal. No respiratory distress.     Breath sounds: Normal breath sounds. No wheezing or rales.  Chest:     Chest wall: No tenderness.  Abdominal:     General: There is no distension.     Palpations: There is no mass.     Tenderness: There is abdominal tenderness in the epigastric area. There is no right CVA tenderness, left CVA tenderness, guarding or rebound.     Hernia: No hernia is present.  Musculoskeletal:     Cervical back: Normal range of motion and neck supple.  Neurological:     Mental Status: She is alert and oriented to person, place, and time.    BP 122/80 (BP Location: Right Arm, Patient Position: Sitting, Cuff Size: Large)   Pulse 68   Temp 98.3 F (36.8 C) (Temporal)   Resp 17   Ht 5\' 2"  (1.575 m)   Wt (!) 230 lb (104.3 kg)    LMP 05/20/2012   SpO2 98%   BMI 42.07 kg/m  Wt Readings from Last 3 Encounters:  06/01/20 (!) 230 lb (104.3 kg)  05/17/20 235 lb (106.6 kg)  04/20/20 232 lb 9.6 oz (105.5 kg)     Lab Results  Component Value Date   WBC 6.3 05/24/2020   HGB 15.8 (H) 05/24/2020   HCT 48.8 (H) 05/24/2020   PLT 192 05/24/2020   GLUCOSE 143 (H) 05/24/2020   CHOL 142 05/23/2019   TRIG 79.0 05/23/2019   HDL 54.00 05/23/2019   LDLCALC 72 05/23/2019   ALT 22 05/24/2020   AST 44 (H) 05/24/2020   NA 136 05/24/2020   K 5.3 (H) 05/24/2020   CL 98 05/24/2020   CREATININE 1.38 (H) 05/24/2020   BUN 18 05/24/2020   CO2 25 05/24/2020   TSH 0.74 04/20/2020   INR 1.05 02/12/2017   HGBA1C 6.6 (H) 03/03/2020   MICROALBUR 1.3 03/03/2020    DG Chest 2 View  Result Date: 05/24/2020 CLINICAL DATA:  Shortness of breath EXAM: CHEST - 2 VIEW COMPARISON:  07/31/2019 FINDINGS: The heart size and mediastinal contours are within normal limits. Both lungs are clear. The visualized skeletal structures are unremarkable. IMPRESSION: No active cardiopulmonary disease. Electronically Signed   By: Constance Holster M.D.   On: 05/24/2020 00:43   CT ABDOMEN PELVIS W CONTRAST  Result Date: 05/24/2020 CLINICAL DATA:  Epigastric abdominal pain, shortness of breath and vomiting. EXAM: CT ABDOMEN AND PELVIS WITH CONTRAST TECHNIQUE: Multidetector CT imaging of the abdomen and pelvis was performed using the standard protocol following bolus administration of intravenous contrast. CONTRAST:  132mL OMNIPAQUE IOHEXOL 300 MG/ML  SOLN COMPARISON:  CT scan 12/15/2019 FINDINGS: Lower chest: Mild cardiac enlargement is stable. No pericardial effusion. The lung bases are clear. Hepatobiliary: No hepatic lesions or intrahepatic biliary dilatation. The gallbladder is surgically absent. No common bile duct dilatation. Pancreas: No mass,  inflammation or ductal dilatation. Spleen: Normal size. No focal lesions. Adrenals/Urinary Tract: The adrenal  glands and kidneys are unremarkable. The bladder is unremarkable. Stomach/Bowel: The stomach, duodenum, small bowel and colon are grossly normal without oral contrast. No acute inflammatory changes, mass lesions or obstructive findings. The terminal ileum and appendix are normal. Scattered colonic diverticulosis but no findings for acute diverticulitis. Vascular/Lymphatic: The aorta is normal in caliber. No dissection. The branch vessels are patent. The major venous structures are patent. No mesenteric or retroperitoneal mass or adenopathy. Small scattered lymph nodes are noted. Reproductive: The uterus and ovaries are normal. Other: No pelvic mass or adenopathy. No free pelvic fluid collections. No inguinal mass or adenopathy. Small periumbilical abdominal wall hernia containing fat. No inguinal hernia. Musculoskeletal: No significant bony findings. Left total hip arthroplasty is noted. No complicating features are identified. Moderate degenerative changes involving the right hip. IMPRESSION: 1. No acute abdominal/pelvic findings, mass lesions or adenopathy. 2. Status post cholecystectomy. No biliary dilatation. 3. Small periumbilical abdominal wall hernia containing fat. Electronically Signed   By: Marijo Sanes M.D.   On: 05/24/2020 07:28   ua -- protein , bili     Assessment & Plan:  Plan  I am having Rhonda Morrow start on omeprazole. I am also having her maintain her multivitamin, Fiber, NONFORMULARY OR COMPOUNDED ITEM, azelastine, aspirin EC, metaxalone, meclizine, ranitidine, diclofenac sodium, mometasone-formoterol, montelukast, ipratropium-albuterol, AeroChamber MV, pantoprazole, B-12, triamcinolone cream, nystatin-triamcinolone, colchicine, traMADol, fluticasone, diclofenac, hydrOXYzine, cetirizine, cyclobenzaprine, amLODipine, hydrochlorothiazide, potassium chloride SA, metFORMIN, onetouch ultrasoft, glucose blood, Calcium 1200, benzonatate, Symbicort, Ventolin HFA, cefdinir, ofloxacin,  rOPINIRole, allopurinol, ondansetron, fluticasone, and clarithromycin.  Meds ordered this encounter  Medications  . DISCONTD: clarithromycin (BIAXIN XL) 500 MG 24 hr tablet    Sig: Take 2 tablets (1,000 mg total) by mouth daily.    Dispense:  14 tablet    Refill:  0  . omeprazole (PRILOSEC) 20 MG capsule    Sig: Take 1 capsule (20 mg total) by mouth daily.    Dispense:  30 capsule    Refill:  3  . clarithromycin (BIAXIN XL) 500 MG 24 hr tablet    Sig: Take 2 tablets (1,000 mg total) by mouth daily.    Dispense:  14 tablet    Refill:  0    Problem List Items Addressed This Visit      Unprioritized   Cough   Relevant Medications   omeprazole (PRILOSEC) 20 MG capsule   clarithromycin (BIAXIN XL) 500 MG 24 hr tablet   Other Relevant Orders   DG Chest 2 View (Completed)    Other Visit Diagnoses    Epigastric pain    -  Primary   Relevant Medications   clarithromycin (BIAXIN XL) 500 MG 24 hr tablet   Other Relevant Orders   CBC with Differential/Platelet   Comprehensive metabolic panel   POCT Urinalysis Dipstick (Automated) (Completed)   Amylase   Lipase   DG Chest 2 View (Completed)    omeprazole  Consider GI if no better  Go to ER if symptoms worsen  She has zofran if n/v returns  Follow-up: No follow-ups on file.  Ann Held, DO

## 2020-06-01 NOTE — Patient Instructions (Signed)
Abdominal Pain, Adult Pain in the abdomen (abdominal pain) can be caused by many things. Often, abdominal pain is not serious and it gets better with no treatment or by being treated at home. However, sometimes abdominal pain is serious. Your health care provider will ask questions about your medical history and do a physical exam to try to determine the cause of your abdominal pain. Follow these instructions at home:  Medicines  Take over-the-counter and prescription medicines only as told by your health care provider.  Do not take a laxative unless told by your health care provider. General instructions  Watch your condition for any changes.  Drink enough fluid to keep your urine pale yellow.  Keep all follow-up visits as told by your health care provider. This is important. Contact a health care provider if:  Your abdominal pain changes or gets worse.  You are not hungry or you lose weight without trying.  You are constipated or have diarrhea for more than 2-3 days.  You have pain when you urinate or have a bowel movement.  Your abdominal pain wakes you up at night.  Your pain gets worse with meals, after eating, or with certain foods.  You are vomiting and cannot keep anything down.  You have a fever.  You have blood in your urine. Get help right away if:  Your pain does not go away as soon as your health care provider told you to expect.  You cannot stop vomiting.  Your pain is only in areas of the abdomen, such as the right side or the left lower portion of the abdomen. Pain on the right side could be caused by appendicitis.  You have bloody or black stools, or stools that look like tar.  You have severe pain, cramping, or bloating in your abdomen.  You have signs of dehydration, such as: ? Dark urine, very little urine, or no urine. ? Cracked lips. ? Dry mouth. ? Sunken eyes. ? Sleepiness. ? Weakness.  You have trouble breathing or chest  pain. Summary  Often, abdominal pain is not serious and it gets better with no treatment or by being treated at home. However, sometimes abdominal pain is serious.  Watch your condition for any changes.  Take over-the-counter and prescription medicines only as told by your health care provider.  Contact a health care provider if your abdominal pain changes or gets worse.  Get help right away if you have severe pain, cramping, or bloating in your abdomen. This information is not intended to replace advice given to you by your health care provider. Make sure you discuss any questions you have with your health care provider. Document Revised: 03/03/2019 Document Reviewed: 03/03/2019 Elsevier Patient Education  2020 [employer] Inc.  

## 2020-06-02 LAB — CBC WITH DIFFERENTIAL/PLATELET
Basophils Absolute: 0 10*3/uL (ref 0.0–0.1)
Basophils Relative: 0.9 % (ref 0.0–3.0)
Eosinophils Absolute: 0 10*3/uL (ref 0.0–0.7)
Eosinophils Relative: 1 % (ref 0.0–5.0)
HCT: 40.5 % (ref 36.0–46.0)
Hemoglobin: 13.2 g/dL (ref 12.0–15.0)
Lymphocytes Relative: 29.9 % (ref 12.0–46.0)
Lymphs Abs: 1.5 10*3/uL (ref 0.7–4.0)
MCHC: 32.6 g/dL (ref 30.0–36.0)
MCV: 85.1 fl (ref 78.0–100.0)
Monocytes Absolute: 0.6 10*3/uL (ref 0.1–1.0)
Monocytes Relative: 12.4 % — ABNORMAL HIGH (ref 3.0–12.0)
Neutro Abs: 2.9 10*3/uL (ref 1.4–7.7)
Neutrophils Relative %: 55.8 % (ref 43.0–77.0)
Platelets: 168 10*3/uL (ref 150.0–400.0)
RBC: 4.76 Mil/uL (ref 3.87–5.11)
RDW: 14.4 % (ref 11.5–15.5)
WBC: 5.2 10*3/uL (ref 4.0–10.5)

## 2020-06-02 LAB — COMPREHENSIVE METABOLIC PANEL
ALT: 17 U/L (ref 0–35)
AST: 19 U/L (ref 0–37)
Albumin: 3.6 g/dL (ref 3.5–5.2)
Alkaline Phosphatase: 57 U/L (ref 39–117)
BUN: 13 mg/dL (ref 6–23)
CO2: 33 mEq/L — ABNORMAL HIGH (ref 19–32)
Calcium: 9.3 mg/dL (ref 8.4–10.5)
Chloride: 101 mEq/L (ref 96–112)
Creatinine, Ser: 0.96 mg/dL (ref 0.40–1.20)
GFR: 72.55 mL/min (ref >60.00)
Glucose, Bld: 104 mg/dL — ABNORMAL HIGH (ref 70–99)
Potassium: 3.4 mEq/L — ABNORMAL LOW (ref 3.5–5.1)
Sodium: 139 mEq/L (ref 135–145)
Total Bilirubin: 0.4 mg/dL (ref 0.2–1.2)
Total Protein: 6.2 g/dL (ref 6.0–8.3)

## 2020-06-02 LAB — LIPASE: Lipase: 9 U/L — ABNORMAL LOW (ref 11.0–59.0)

## 2020-06-02 LAB — AMYLASE: Amylase: 26 U/L — ABNORMAL LOW (ref 27–131)

## 2020-06-09 ENCOUNTER — Other Ambulatory Visit: Payer: Self-pay | Admitting: Family Medicine

## 2020-06-09 DIAGNOSIS — G2581 Restless legs syndrome: Secondary | ICD-10-CM

## 2020-06-10 ENCOUNTER — Telehealth: Payer: Self-pay | Admitting: Family Medicine

## 2020-06-10 DIAGNOSIS — R059 Cough, unspecified: Secondary | ICD-10-CM

## 2020-06-10 NOTE — Telephone Encounter (Signed)
Patient states that she lost her medication and would like a refill sent to Pharmacy    Medication: benzonatate (TESSALON) 200 MG capsule    Has the patient contacted their pharmacy? No. (If no, request that the patient contact the pharmacy for the refill.) (If yes, when and what did the pharmacy advise?)  Preferred Pharmacy (with phone number or street name): [id number] - [place] [address]  [place], [place] [address]  Phone:  [phone] Fax:  [phone]  DEA #:  [id number]  Agent: Please be advised that RX refills may take up to 3 business days. We ask that you follow-up with your pharmacy.

## 2020-06-11 MED ORDER — BENZONATATE 200 MG PO CAPS: 200.0000 mg | ORAL_CAPSULE | Freq: Three times a day (TID) | ORAL | 1 refills | Status: DC | PRN

## 2020-06-11 NOTE — Telephone Encounter (Signed)
Refill sent.

## 2020-06-21 ENCOUNTER — Other Ambulatory Visit: Payer: Self-pay

## 2020-06-21 ENCOUNTER — Encounter: Payer: Self-pay | Admitting: Neurology

## 2020-06-21 ENCOUNTER — Ambulatory Visit (INDEPENDENT_AMBULATORY_CARE_PROVIDER_SITE_OTHER): Payer: Federal, State, Local not specified - PPO | Admitting: Neurology

## 2020-06-21 VITALS — BP 140/78 | HR 83 | Ht 64.0 in | Wt 231.0 lb

## 2020-06-21 DIAGNOSIS — J4540 Moderate persistent asthma, uncomplicated: Secondary | ICD-10-CM | POA: Diagnosis not present

## 2020-06-21 DIAGNOSIS — R351 Nocturia: Secondary | ICD-10-CM

## 2020-06-21 DIAGNOSIS — Z6841 Body Mass Index (BMI) 40.0 and over, adult: Secondary | ICD-10-CM

## 2020-06-21 DIAGNOSIS — R0602 Shortness of breath: Secondary | ICD-10-CM | POA: Diagnosis not present

## 2020-06-21 DIAGNOSIS — R252 Cramp and spasm: Secondary | ICD-10-CM

## 2020-06-21 DIAGNOSIS — G478 Other sleep disorders: Secondary | ICD-10-CM | POA: Diagnosis not present

## 2020-06-21 DIAGNOSIS — G2581 Restless legs syndrome: Secondary | ICD-10-CM

## 2020-06-21 NOTE — Patient Instructions (Signed)
Restless Legs Syndrome Restless legs syndrome is a condition that causes uncomfortable feelings or sensations in the legs, especially while sitting or lying down. The sensations usually cause an overwhelming urge to move the legs. The arms can also sometimes be affected. The condition can range from mild to severe. The symptoms often interfere with a person's ability to sleep. What are the causes? The cause of this condition is not known. What increases the risk? The following factors may make you more likely to develop this condition:  Being older than 50.  Pregnancy.  Being a woman. In general, the condition is more common in women than in men.  A family history of the condition.  Having iron deficiency.  Overuse of caffeine, nicotine, or alcohol.  Certain medical conditions, such as kidney disease, Parkinson's disease, or nerve damage.  Certain medicines, such as those for high blood pressure, nausea, colds, allergies, depression, and some heart conditions. What are the signs or symptoms? The main symptom of this condition is uncomfortable sensations in the legs, such as:  Pulling.  Tingling.  Prickling.  Throbbing.  Crawling.  Burning. Usually, the sensations:  Affect both sides of the body.  Are worse when you sit or lie down.  Are worse at night. These may wake you up or make it difficult to fall asleep.  Make you have a strong urge to move your legs.  Are temporarily relieved by moving your legs. The arms can also be affected, but this is rare. People who have this condition often have tiredness during the day because of their lack of sleep at night. How is this diagnosed? This condition may be diagnosed based on:  Your symptoms.  Blood tests. In some cases, you may be monitored in a sleep lab by a specialist (a sleep study). This can detect any disruptions in your sleep. How is this treated? This condition is treated by managing the symptoms. This may  include:  Lifestyle changes, such as exercising, using relaxation techniques, and avoiding caffeine, alcohol, or tobacco.  Medicines. Anti-seizure medicines may be tried first. Follow these instructions at home:     General instructions  Take over-the-counter and prescription medicines only as told by your health care provider.  Use methods to help relieve the uncomfortable sensations, such as: ? Massaging your legs. ? Walking or stretching. ? Taking a cold or hot bath.  Keep all follow-up visits as told by your health care provider. This is important. Lifestyle  Practice good sleep habits. For example, go to bed and get up at the same time every day. Most adults should get 7-9 hours of sleep each night.  Exercise regularly. Try to get at least 30 minutes of exercise most days of the week.  Practice ways of relaxing, such as yoga or meditation.  Avoid caffeine and alcohol.  Do not use any products that contain nicotine or tobacco, such as cigarettes and e-cigarettes. If you need help quitting, ask your health care provider. Contact a health care provider if:  Your symptoms get worse or they do not improve with treatment. Summary  Restless legs syndrome is a condition that causes uncomfortable feelings or sensations in the legs, especially while sitting or lying down.  The symptoms often interfere with a person's ability to sleep.  This condition is treated by managing the symptoms. You may need to make lifestyle changes or take medicines. This information is not intended to replace advice given to you by your health care provider. Make sure  you discuss any questions you have with your health care provider. Document Revised: 11/12/2017 Document Reviewed: 11/12/2017 Elsevier Patient Education  2020 [place] Sleep Information, Adult Quality sleep is important for your mental and physical health. It also improves your quality of life. Quality sleep means  you:  Are asleep for most of the time you are in bed.  Fall asleep within 30 minutes.  Wake up no more than once a night.  Are awake for no longer than 20 minutes if you do wake up during the night. Most adults need 7-8 hours of quality sleep each night. How can poor sleep affect me? If you do not get enough quality sleep, you may have:  Mood swings.  Daytime sleepiness.  Confusion.  Decreased reaction time.  Sleep disorders, such as insomnia and sleep apnea.  Difficulty with: ? Solving problems. ? Coping with stress. ? Paying attention. These issues may affect your performance and productivity at work, school, and at home. Lack of sleep may also put you at higher risk for accidents, suicide, and risky behaviors. If you do not get quality sleep you may also be at higher risk for several health problems, including:  Infections.  Type 2 diabetes.  Heart disease.  High blood pressure.  Obesity.  Worsening of long-term conditions, like arthritis, kidney disease, depression, Parkinson's disease, and epilepsy. What actions can I take to get more quality sleep?      Stick to a sleep schedule. Go to sleep and wake up at about the same time each day. Do not try to sleep less on weekdays and make up for lost sleep on weekends. This does not work.  Try to get about 30 minutes of exercise on most days. Do not exercise 2-3 hours before going to bed.  Limit naps during the day to 30 minutes or less.  Do not use any products that contain nicotine or tobacco, such as cigarettes or e-cigarettes. If you need help quitting, ask your health care provider.  Do not drink caffeinated beverages for at least 8 hours before going to bed. Coffee, tea, and some sodas contain caffeine.  Do not drink alcohol close to bedtime.  Do not eat large meals close to bedtime.  Do not take naps in the late afternoon.  Try to get at least 30 minutes of sunlight every day. Morning sunlight is  best.  Make time to relax before bed. Reading, listening to music, or taking a hot bath promotes quality sleep.  Make your bedroom a place that promotes quality sleep. Keep your bedroom dark, quiet, and at a comfortable room temperature. Make sure your bed is comfortable. Take out sleep distractions like TV, a computer, smartphone, and bright lights.  If you are lying awake in bed for longer than 20 minutes, get up and do a relaxing activity until you feel sleepy.  Work with your health care provider to treat medical conditions that may affect sleeping, such as: ? Nasal obstruction. ? Snoring. ? Sleep apnea and other sleep disorders.  Talk to your health care provider if you think any of your prescription medicines may cause you to have difficulty falling or staying asleep.  If you have sleep problems, talk with a sleep consultant. If you think you have a sleep disorder, talk with your health care provider about getting evaluated by a specialist. Where to find more information  [place] website: https://sleepfoundation.org  National Heart, Lung, and [place] ([place]): http://www.saunders.info/.pdf  Centers for Disease  Control and Prevention (CDC): LearningDermatology.pl Contact a health care provider if you:  Have trouble getting to sleep or staying asleep.  Often wake up very early in the morning and cannot get back to sleep.  Have daytime sleepiness.  Have daytime sleep attacks of suddenly falling asleep and sudden muscle weakness (narcolepsy).  Have a tingling sensation in your legs with a strong urge to move your legs (restless legs syndrome).  Stop breathing briefly during sleep (sleep apnea).  Think you have a sleep disorder or are taking a medicine that is affecting your quality of sleep. Summary  Most adults need 7-8 hours of quality sleep each night.  Getting enough quality sleep is an important part of  health and well-being.  Make your bedroom a place that promotes quality sleep and avoid things that may cause you to have poor sleep, such as alcohol, caffeine, smoking, and large meals.  Talk to your health care provider if you have trouble falling asleep or staying asleep. This information is not intended to replace advice given to you by your health care provider. Make sure you discuss any questions you have with your health care provider. Document Revised: 01/30/2018 Document Reviewed: 01/30/2018 Elsevier Patient Education  [place].

## 2020-06-21 NOTE — Progress Notes (Signed)
SLEEP MEDICINE CLINIC    Provider:  Larey Seat, MD  Primary Care Physician:  Ann Held, DO [address] [place] [address]     Referring Provider: Claudette Laws [address] [place],  [place] [address]          Chief Complaint according to patient   Patient presents with:    . New Patient (Initial Visit)           HISTORY OF PRESENT ILLNESS:  Rhonda Morrow is a 59 y.o. year old 4 or [place] American female patient seen here as a referral on 06/21/2020 from Dr Etter SjogrenCheri Rous for a sleep study.    Chief concern according to patient : pt here  with sign language interpretor, states that she has been having horrible cramps that happen at night time. she doesn't have during the day.cramps and creepy crawly sensations wake her up from sleep. she has to get up walk around rub legs. Started on rRequip.  She has felt tired , more and more- her husband is also deaf and there is no witness to snoring(?)>  Last SS > 4 yrs which was negative for OSA at [facility]-  No AHI, no PLMD, and no hypoxemia noted. No graph for sleep PSG was available.     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  She  has a past medical history of Arthritis, Asthma, Complication of anesthesia, Deaf, Diabetes mellitus without complication ([place]), GERD (gastroesophageal reflux disease), Hypertension, Left groin pain, and Thyroid disease.  Dr Posey Pronto has worked this patient up for hyperreflexia. DM2.  Left hip surgery- hip replacement.  Sleep relevant medical history: Nocturia 3 times, urge incontinence. RLS -  Hypersomnia, obesity.    Family medical /sleep history: there is no other family member on CPAP with OSA, insomnia, sleep walkers.    Social history:  Patient is working full time- post office on [employer],  and lives in a household with her deaf spouse-her  mother and son (30).   The patient currently works/ used to work in shifts( night/ rotating,) 6.30 - 7  Pets are not present. Tobacco use- never.  ETOH use rare ,  Caffeine intake in form of Soda( at work ). Regular exercise in form of walking.    Hobbies   Sleep habits are as follows: The patient's dinner time is between 6-7 PM, mother cooks for her  The patient goes to bed at 9-10 PM and continues to sleep for 3 hours, wakes for 2-3 bathroom breaks, the first time at 2 AM.   The preferred sleep position is supine or side, with the support of  pillows. Dreams are reportedly frequent/vivid .   5 AM is the usual rise time. The patient wakes up with an alarm.  She reports not feeling refreshed or restored in AM, with symptoms such as dry mouth , morning headaches , and residual fatigue. Sometimes feeling dizzy.  Naps are taken infrequently, and she naps for 1-3 hours -no power naps.    3.5.2018 and 6.4.2018 seen by Dr. Posey Pronto"   Her headaches have significantly improved since having her left hip surgery and she feels that this may have been improved because she is on medical leave from work to recover from her hip surgery. 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 I ordered MRI cervical spine to look for compressive myelopathy at her last visit, but I did not receive these results and patient confirms that she had this performed in [facility]. I requested my staff to call imaging centers in [facility], but none confirmed that she had MRI cervical spine.  She does not have any new neurological concerns".     Review of Systems: Out of a complete 14 system review, the patient complains of only the following symptoms, and all other reviewed systems are negative.:  Fatigue, sleepiness , snoring, fragmented sleep, nocturia, leg cramping.    How likely are you to doze in the  following situations: 0 = not likely, 1 = slight chance, 2 = moderate chance, 3 = high chance   Sitting and Reading? Watching Television? Sitting inactive in a public place (theater or meeting)? As a passenger in a car for an hour without a break? Lying down in the afternoon when circumstances permit? Sitting and talking to someone? Sitting quietly after lunch without alcohol? In a car, while stopped for a few minutes in traffic?   Total = 10/ 24 points   FSS endorsed at 40/ 63 points.   Social History   Socioeconomic History  . Marital status: Married    Spouse name: Not on file  . Number of children: Not on file  . Years of education: Not on file  . Highest education level: Not on file  Occupational History  . Occupation: disabled  Tobacco Use  . Smoking status: Never Smoker  . Smokeless tobacco: Never Used  Vaping Use  . Vaping Use: Never used  Substance and Sexual Activity  . Alcohol use: No    Alcohol/week: 0.0 standard drinks  . Drug use: No  . Sexual activity: Never  Other Topics Concern  . Not on file  Social History Narrative   Lives with family.  Has 3 children.  Works for [employer].  Education: high school.   Social Determinants of Health   Financial Resource Strain:   . Difficulty of Paying Living Expenses:   Food Insecurity:   . Worried About Charity fundraiser in the Last Year:   . Arboriculturist in the Last Year:   Transportation Needs:   . Film/video editor (Medical):   Marland Kitchen Lack of Transportation (Non-Medical):   Physical Activity:   . Days of Exercise per Week:   . Minutes of Exercise per Session:   Stress:   . Feeling of Stress :   Social Connections:   . Frequency of Communication with Friends and Family:   . Frequency of Social Gatherings with Friends and Family:   . Attends Religious Services:   . Active Member of Clubs or Organizations:   . Attends Archivist Meetings:   Marland Kitchen Marital Status:     Family History  Problem  Relation Age of Onset  . Diabetes Mother   . Heart disease Mother   . Diabetes Father     Past Medical History:  Diagnosis Date  . Arthritis   . Asthma   . Complication of anesthesia    one time woke up and was vey scared,17 yrs ago  . Deaf   . Diabetes mellitus without complication ([place])   . GERD (gastroesophageal reflux disease)   . Hypertension   . Left groin pain   .  Thyroid disease     Past Surgical History:  Procedure Laterality Date  . CARDIAC CATHETERIZATION    . CESAREAN SECTION    . CHOLECYSTECTOMY N/A 06/20/2016   Procedure: LAPAROSCOPIC CHOLECYSTECTOMY;  Surgeon: Rolm Bookbinder, MD;  Location: [facility];  Service: General;  Laterality: N/A;  . ESOPHAGEAL MANOMETRY N/A 09/29/2013   Procedure: ESOPHAGEAL MANOMETRY (EM);  Surgeon: Milus Banister, MD;  Location: [facility];  Service: Endoscopy;  Laterality: N/A;  . TOTAL HIP ARTHROPLASTY Left 02/21/2017   Procedure: LEFT TOTAL HIP ARTHROPLASTY ANTERIOR APPROACH;  Surgeon: Leandrew Koyanagi, MD;  Location: [facility];  Service: Orthopedics;  Laterality: Left;     Current Outpatient Medications on File Prior to Visit  Medication Sig Dispense Refill  . allopurinol (ZYLOPRIM) 100 MG tablet Take 1 tablet (100 mg total) by mouth daily. 90 tablet 3  . amLODipine (NORVASC) 5 MG tablet Take 1 tablet (5 mg total) by mouth daily. 90 tablet 1  . aspirin EC 325 MG tablet Take 1 tablet (325 mg total) by mouth 2 (two) times daily. 84 tablet 0  . azelastine (ASTELIN) 0.1 % nasal spray Place 2 sprays into both nostrils 2 (two) times daily. Use in each nostril as directed 30 mL 3  . benzonatate (TESSALON) 200 MG capsule Take 1 capsule (200 mg total) by mouth 3 (three) times daily as needed for cough. 30 capsule 1  . Calcium Carbonate-Vit D-Min (CALCIUM 1200) 1200-1000 MG-UNIT CHEW 1 po qd 30 tablet 11  . cefdinir (OMNICEF) 300 MG capsule Take 1 capsule (300 mg total) by mouth 2 (two) times daily. 20 capsule 0  . cetirizine (ZYRTEC) 10 MG tablet  TAKE 1 TABLET (10 MG TOTAL) BY MOUTH DAILY. 90 tablet 1  . clarithromycin (BIAXIN XL) 500 MG 24 hr tablet Take 2 tablets (1,000 mg total) by mouth daily. 14 tablet 0  . colchicine 0.6 MG tablet Take 1 tablet (0.6 mg total) by mouth daily. 90 tablet 1  . Cyanocobalamin (B-12) 1000 MCG SUBL Dissolove 1 tablet under tongue once a day 90 each 1  . cyclobenzaprine (FLEXERIL) 10 MG tablet TAKE 1 TABLET BY MOUTH THREE TIMES A DAY AS NEEDED FOR MUSCLE SPASMS 30 tablet 2  . diclofenac (VOLTAREN) 75 MG EC tablet TAKE 1 TABLET (75 MG TOTAL) BY MOUTH 2 (TWO) TIMES DAILY AS NEEDED. 60 tablet 3  . diclofenac sodium (VOLTAREN) 1 % GEL Apply 4 g topically 4 (four) times daily as needed. 500 g 6  . Fiber CHEW Chew 1 tablet by mouth daily.    . fluticasone (FLONASE) 50 MCG/ACT nasal spray Place 2 sprays into both nostrils daily. 16 g 6  . fluticasone (FLONASE) 50 MCG/ACT nasal spray SPRAY 2 SPRAYS INTO EACH NOSTRIL EVERY DAY 48 mL 1  . glucose blood test strip Use as instructed--- one touch verio test strips 100 each 12  . hydrochlorothiazide (HYDRODIURIL) 12.5 MG tablet Take 1 tablet (12.5 mg total) by mouth daily. 90 tablet 1  . hydrOXYzine (ATARAX/VISTARIL) 10 MG tablet TAKE 1 TO 2 TABLETS BY MOUTH AT BEDTIME AS NEEDED FOR ITCHING 180 tablet 1  . ipratropium-albuterol (DUONEB) 0.5-2.5 (3) MG/3ML SOLN Take 3 mLs by nebulization every 6 (six) hours as needed. 360 mL 3  . Lancets (ONETOUCH ULTRASOFT) lancets Use as instructed 100 each 12  . meclizine (ANTIVERT) 12.5 MG tablet Take 1 tablet (12.5 mg total) by mouth 3 (three) times daily as needed for dizziness. 30 tablet 0  . metaxalone (SKELAXIN) 800 MG  tablet TAKE 1 TABLET BY MOUTH THREE TIMES A DAY (Patient taking differently: Take 800 mg by mouth 3 (three) times daily. ) 30 tablet 2  . metFORMIN (GLUCOPHAGE-XR) 500 MG 24 hr tablet Take 1 tablet (500 mg total) by mouth daily with breakfast. 90 tablet 1  . mometasone-formoterol (DULERA) 100-5 MCG/ACT AERO Inhale 2  puffs into the lungs 2 (two) times daily. 1 Inhaler PRN  . montelukast (SINGULAIR) 10 MG tablet Take 1 tablet (10 mg total) by mouth at bedtime. 90 tablet 3  . Multiple Vitamin (MULTIVITAMIN) tablet Take 1 tablet by mouth daily.    . NONFORMULARY OR COMPOUNDED ITEM Nebulizer   DX ASTHMA 1 each 0  . nystatin-triamcinolone (MYCOLOG II) cream Apply twice daily to lower chest area/upper abdomen 30 g 0  . ofloxacin (FLOXIN OTIC) 0.3 % OTIC solution Place 10 drops into the right ear daily. 10 mL 0  . omeprazole (PRILOSEC) 20 MG capsule Take 1 capsule (20 mg total) by mouth daily. 30 capsule 3  . ondansetron (ZOFRAN ODT) 4 MG disintegrating tablet Take 1 tablet (4 mg total) by mouth every 8 (eight) hours as needed for nausea or vomiting. 20 tablet 0  . pantoprazole (PROTONIX) 40 MG tablet Take 1 tablet (40 mg total) by mouth daily. 30 tablet 3  . potassium chloride SA (KLOR-CON M20) 20 MEQ tablet Take 1 tablet (20 mEq total) by mouth daily. 90 tablet 1  . ranitidine (ZANTAC) 150 MG tablet TAKE 1 TABLET BY MOUTH TWICE A DAY (Patient taking differently: Take 150 mg by mouth 2 (two) times daily. ) 180 tablet 1  . rOPINIRole (REQUIP) 0.25 MG tablet TAKE 1 TABLET BY MOUTH AT BEDTIME FOR 4 DAYS THEN INCREASE TO 2 TABLETS AT BEDTIME IF NEEDED 60 tablet 0  . Spacer/Aero-Holding Chambers (AEROCHAMBER MV) inhaler Use as instructed 1 each 0  . SYMBICORT 160-4.5 MCG/ACT inhaler Inhale 2 puffs into the lungs every 12 (twelve) hours. 1 Inhaler 5  . traMADol (ULTRAM) 50 MG tablet TAKE 1 TABLET (50 MG TOTAL) BY MOUTH EVERY 8 (EIGHT) HOURS AS NEEDED FOR UP TO 5 DAYS. 16 tablet 0  . triamcinolone cream (KENALOG) 0.1 % Apply bid to hands 30 g 0  . VENTOLIN HFA 108 (90 Base) MCG/ACT inhaler Inhale 2 puffs into the lungs every 6 (six) hours as needed for wheezing or shortness of breath. 18 g 5   No current facility-administered medications on file prior to visit.    Allergies  Allergen Reactions  . Losartan Shortness Of  Breath  . Oxycodone-Acetaminophen Nausea And Vomiting  . Augmentin [Amoxicillin-Pot Clavulanate] Diarrhea    Physical exam:  Today's Vitals   06/21/20 1310  BP: 140/78  Pulse: 83  Weight: 231 lb (104.8 kg)  Height: 5\' 4"  (1.626 m)   Body mass index is 39.65 kg/m.   Wt Readings from Last 3 Encounters:  06/21/20 231 lb (104.8 kg)  06/01/20 (!) 230 lb (104.3 kg)  05/17/20 235 lb (106.6 kg)     Ht Readings from Last 3 Encounters:  06/21/20 5\' 4"  (1.626 m)  06/01/20 5\' 2"  (1.575 m)  05/17/20 5\' 2"  (1.575 m)      General: The patient is awake, alert and appears not in acute distress. The patient is well groomed. Head: Normocephalic, atraumatic. Neck is supple. Mallampati 3 plus,  neck circumference: 17 inches . Nasal airflow  patent.  Retrognathia is  seen.   Cardiovascular:  Regular rate and cardiac rhythm by pulse,  without distended  neck veins. Respiratory: Lungs are clear to auscultation.  Skin:  Without evidence of ankle edema, she has shin rash . Wears a wig.  Trunk: The patient's posture is erect.   Neurologic exam : The patient is awake and alert, oriented to place and time.   Memory subjective described as intact.  Attention span & concentration ability appears normal.  Speech is fluent,  without  dysarthria, dysphonia or aphasia.  Mood and affect are appropriate.   Cranial nerves: no loss of smell or taste reported  Pupils are equal and briskly reactive to light. Funduscopic exam deferred.   Extraocular movements in vertical and horizontal planes were intact and without nystagmus. No Diplopia. Visual fields by finger perimetry are intact. Hearing was intact to soft voice and finger rubbing.    Facial sensation intact to fine touch.  Facial motor strength is symmetric and tongue and uvula move midline.  Neck ROM : rotation, tilt and flexion extension were normal for age and shoulder shrug was symmetrical.    Motor exam:  Symmetric bulk, tone and ROM.   Normal  tone without cog wheeling, symmetric grip strength .   Sensory:  Fine touch, pinprick and vibration were tested  and  normal.  Proprioception tested in the upper extremities was normal.   Coordination: Rapid alternating movements in the fingers/hands were of normal speed.  The Finger-to-nose maneuver was intact without evidence of ataxia, dysmetria or tremor.   Gait and station: Patient could rise unassisted from a seated position, walked without assistive device.  Stance is of normal width/ base and the patient turned with 3 steps.  Toe and heel walk were deferred.  Deep tendon reflexes: in the  upper and lower extremities are symmetric and intact.  Upper reflexes are brisk-  Babinski response was downgoing.       After spending a total time of  50 minutes face to face and additional time for physical and neurologic examination, review of laboratory studies,  Time for the interpreter.  And personal review of imaging studies, reports and results of other testing and review of referral information / records as far as provided in visit, I have established the following assessments:  1)   Frequent nocturia and sleep interruption from leg cramps - waking her up rather than making it difficult to stay asleep. Ropinorol has been initiated for RLS- making it easier to go to sleep.   2)   Risk factors for OSA are high grade upper airway obstruction, large neck and high BMI.      My Plan is to proceed with:  1) attended sleep study with PSG regular montage.  2) leg cramping responding to Requip , at least somewhat .   I would like to thank Carollee Herter, Alferd Apa, DO and [place], [address],  [place] [address] for allowing me to meet with and to take care of this pleasant patient.   In short, Rhonda Morrow is presenting with treated RLS and untreated nocturia, possible OSA -plan to follow up through our NP within 2-3  month.   CC: I will share my notes  with PCP. Marland Kitchen  Electronically signed by: Larey Seat, MD 06/21/2020 1:13 PM  [facility] Board certified by The [employer] of Sleep Medicine and Diplomate of the [employer] of Sleep Medicine. Board certified In Neurology through the [place], Fellow of the [employer] of Neurology. Medical Director of  [facility].

## 2020-06-28 ENCOUNTER — Telehealth: Payer: Self-pay

## 2020-06-28 ENCOUNTER — Telehealth: Payer: Self-pay | Admitting: Family Medicine

## 2020-06-28 DIAGNOSIS — G2581 Restless legs syndrome: Secondary | ICD-10-CM

## 2020-06-28 MED ORDER — ROPINIROLE HCL 0.25 MG PO TABS: ORAL_TABLET | ORAL | 0 refills | Status: DC

## 2020-06-28 NOTE — Telephone Encounter (Signed)
Refill sent.

## 2020-06-28 NOTE — Telephone Encounter (Signed)
LVM for pt to call me back to schedule sleep study  

## 2020-06-28 NOTE — Telephone Encounter (Signed)
Medication: rOPINIRole (REQUIP) 0.25 MG tablet [[id number]]    Has the patient contacted their pharmacy? No. (If no, request that the patient contact the pharmacy for the refill.) (If yes, when and what did the pharmacy advise?)  Preferred Pharmacy (with phone number or street name): [id number] - [place], [address], [place] [address]  Phone:  [phone] Fax:  [phone]  DEA #:  [id number]  Agent: Please be advised that RX refills may take up to 3 business days. We ask that you follow-up with your pharmacy.

## 2020-07-03 ENCOUNTER — Other Ambulatory Visit: Payer: Self-pay | Admitting: Family Medicine

## 2020-07-03 DIAGNOSIS — G2581 Restless legs syndrome: Secondary | ICD-10-CM

## 2020-07-14 ENCOUNTER — Ambulatory Visit (INDEPENDENT_AMBULATORY_CARE_PROVIDER_SITE_OTHER): Payer: Federal, State, Local not specified - PPO | Admitting: Neurology

## 2020-07-14 ENCOUNTER — Other Ambulatory Visit: Payer: Self-pay

## 2020-07-14 DIAGNOSIS — G478 Other sleep disorders: Secondary | ICD-10-CM

## 2020-07-14 DIAGNOSIS — G4733 Obstructive sleep apnea (adult) (pediatric): Secondary | ICD-10-CM

## 2020-07-14 DIAGNOSIS — R0602 Shortness of breath: Secondary | ICD-10-CM

## 2020-07-14 DIAGNOSIS — J4540 Moderate persistent asthma, uncomplicated: Secondary | ICD-10-CM

## 2020-07-14 DIAGNOSIS — G2581 Restless legs syndrome: Secondary | ICD-10-CM

## 2020-07-14 DIAGNOSIS — Z6841 Body Mass Index (BMI) 40.0 and over, adult: Secondary | ICD-10-CM

## 2020-07-14 DIAGNOSIS — R252 Cramp and spasm: Secondary | ICD-10-CM

## 2020-07-14 DIAGNOSIS — R351 Nocturia: Secondary | ICD-10-CM

## 2020-07-14 IMAGING — MG MM DIGITAL SCREENING BILAT W/ TOMO W/ CAD
8 of 15 series · 8 of 40 positions shown · non-contrast
Comparison: Previous exam(s).

ACR Breast Density Category a: The breast tissue is almost entirely
fatty.

CLINICAL DATA: Screening.

EXAM:
DIGITAL SCREENING BILATERAL MAMMOGRAM WITH TOMO AND CAD

[R CC synth-2D]
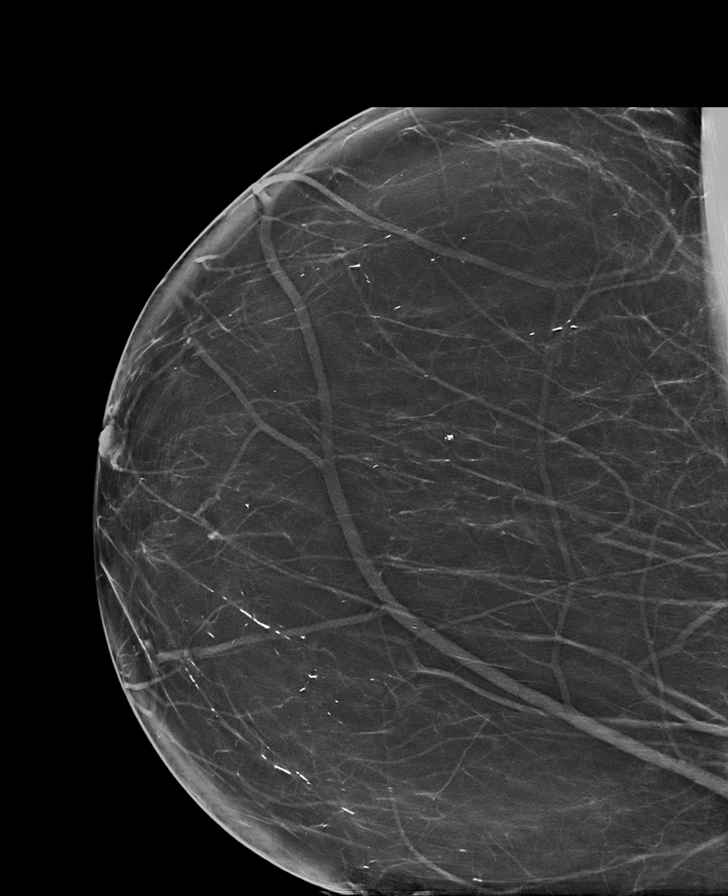

[L CC synth-2D]
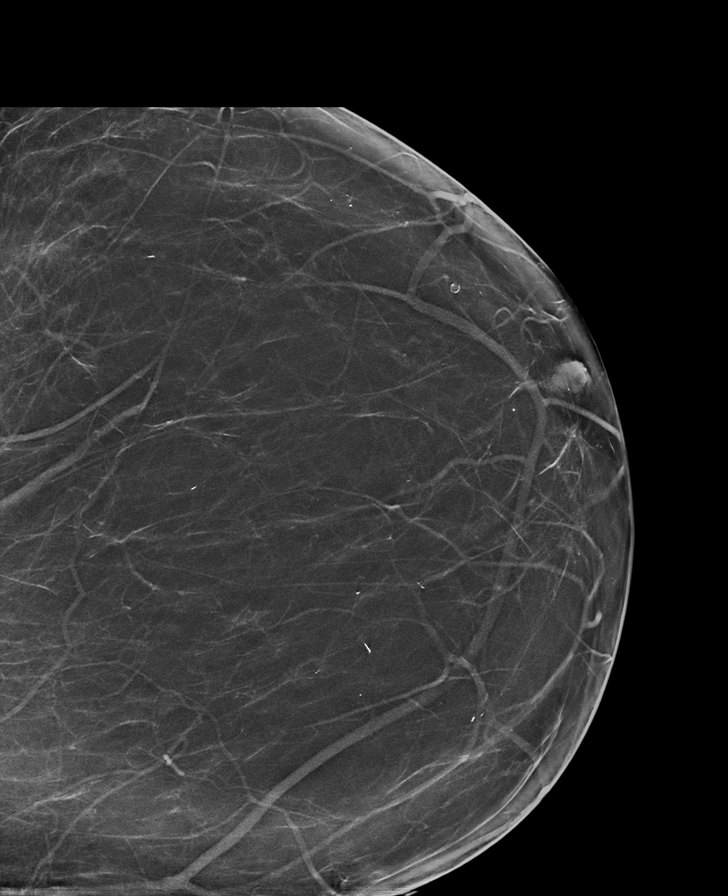

[R CV synth-2D]
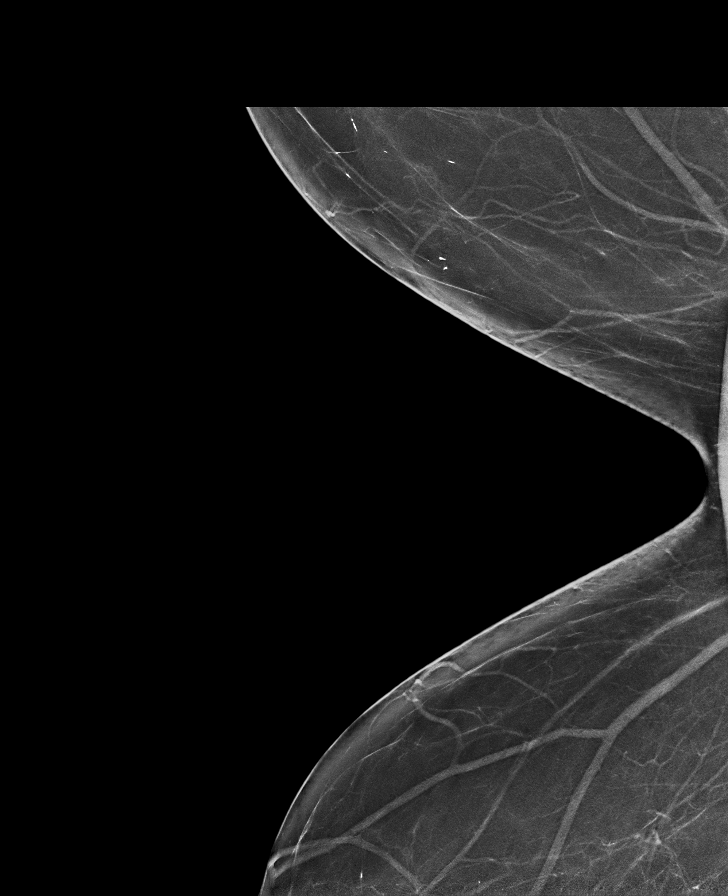

[R MLO synth-2D (1 of 2)]
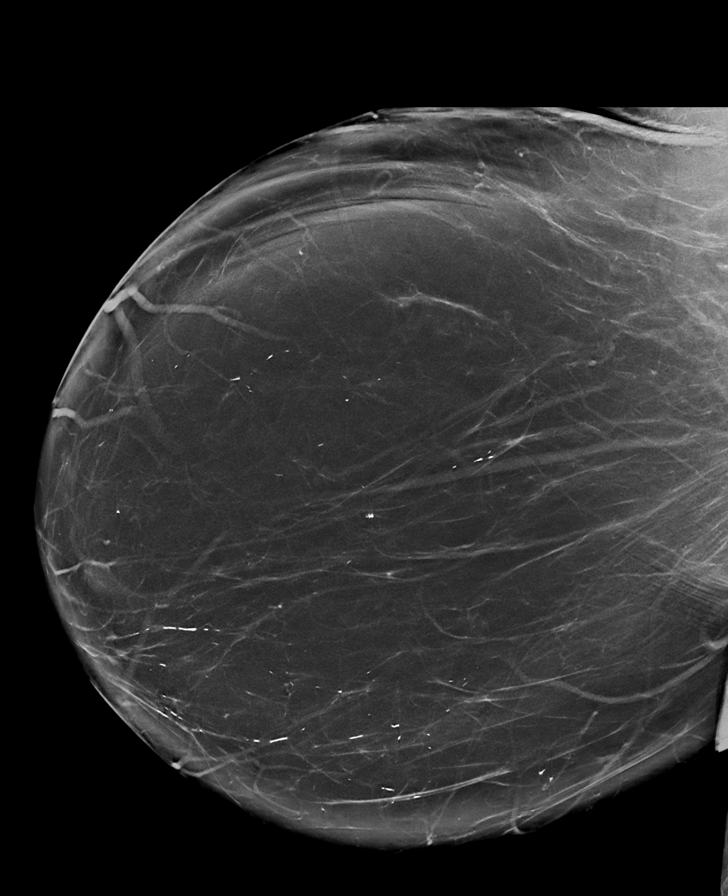

[L MLO synth-2D]
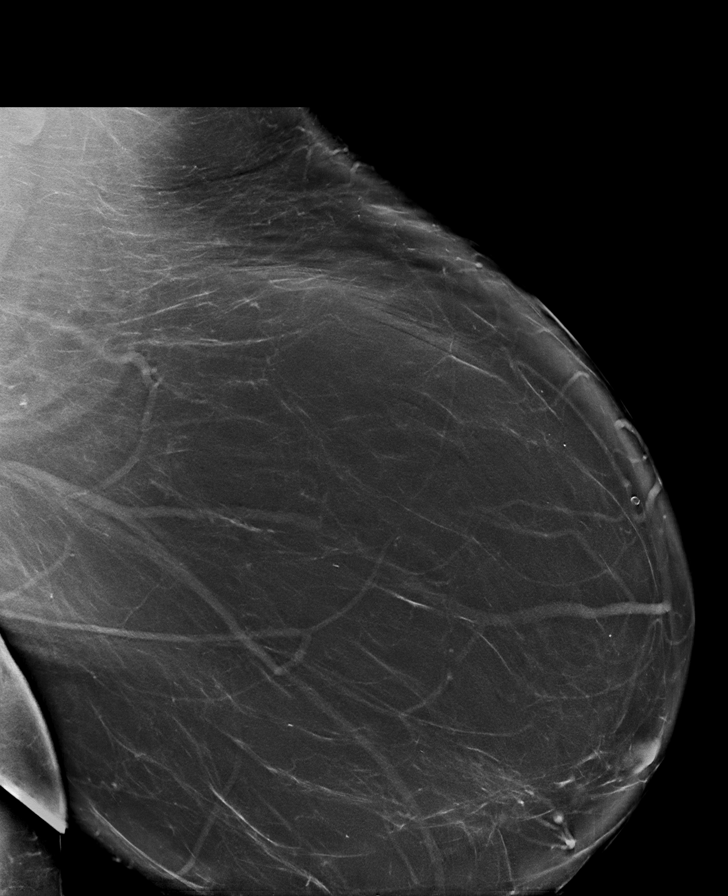

[R MLO synth-2D (2 of 2)]
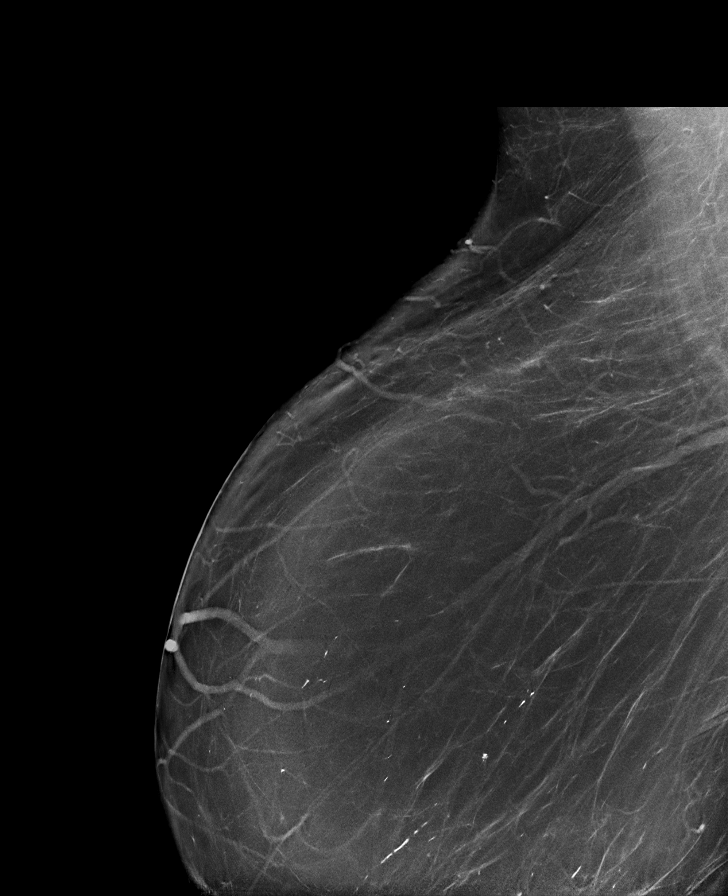

[R XCCL synth-2D]
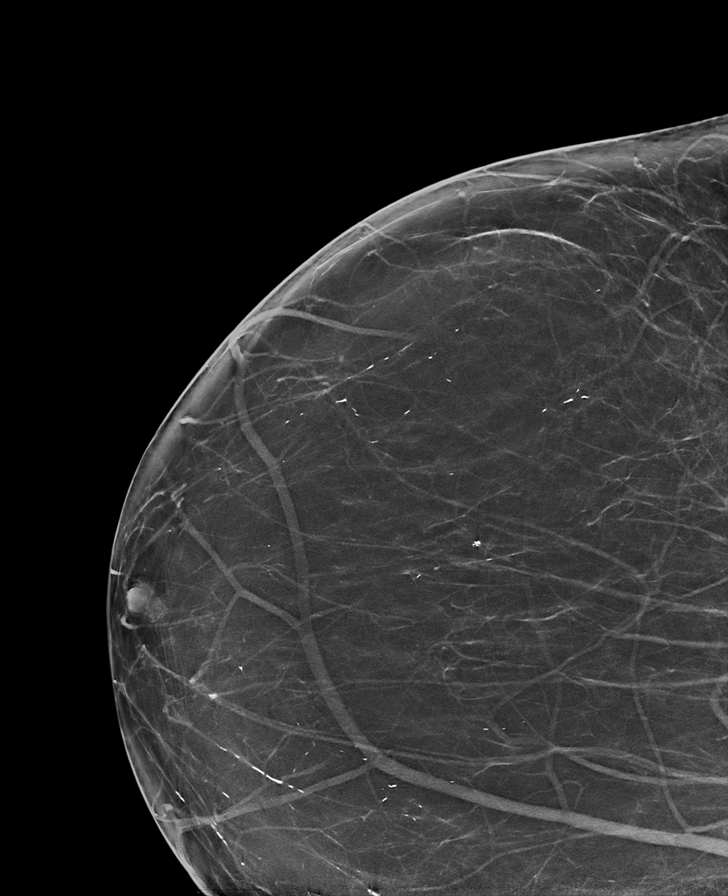

[R XCCL tomo · tomo slice 54/79.0]
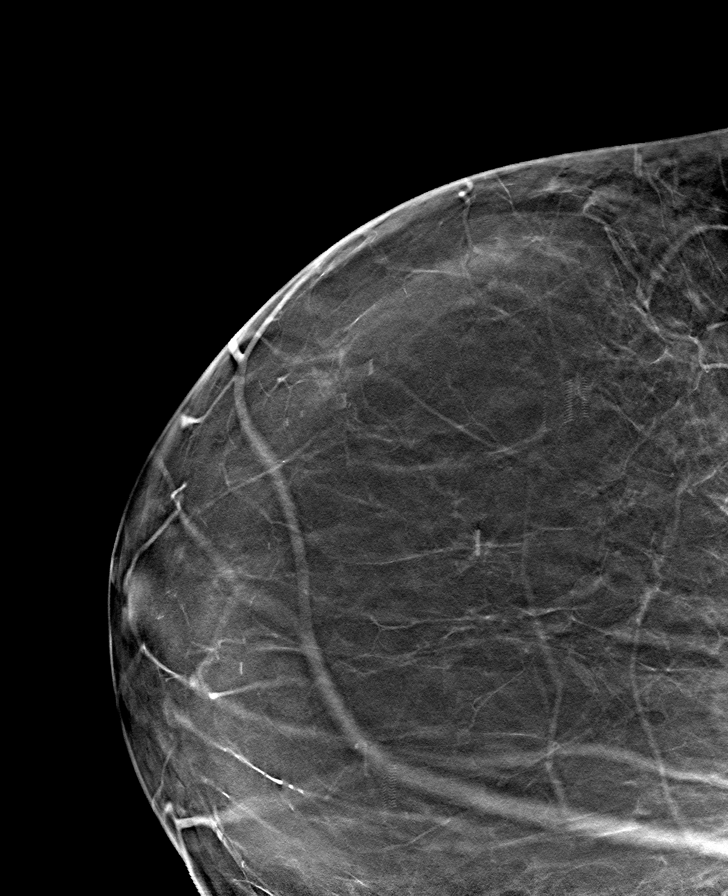

[8 of 40 positions shown; findings below may reference images not displayed]

FINDINGS: There are no findings suspicious for malignancy. Images were
processed with CAD.
IMPRESSION: No mammographic evidence of malignancy. A result letter of this
screening mammogram will be mailed directly to the patient.

RECOMMENDATION:
Screening mammogram in one year. (Code:[id number]-[id number])

BI-RADS CATEGORY  1: Negative.

## 2020-07-20 ENCOUNTER — Other Ambulatory Visit: Payer: Self-pay | Admitting: Family Medicine

## 2020-07-20 DIAGNOSIS — G2581 Restless legs syndrome: Secondary | ICD-10-CM

## 2020-07-22 ENCOUNTER — Encounter: Payer: Self-pay | Admitting: Family Medicine

## 2020-07-22 ENCOUNTER — Other Ambulatory Visit: Payer: Self-pay

## 2020-07-22 ENCOUNTER — Ambulatory Visit (INDEPENDENT_AMBULATORY_CARE_PROVIDER_SITE_OTHER): Payer: Federal, State, Local not specified - PPO | Admitting: Family Medicine

## 2020-07-22 VITALS — BP 126/80 | HR 80 | Temp 97.9°F | Resp 20 | Ht 64.0 in | Wt 235.0 lb

## 2020-07-22 DIAGNOSIS — E1169 Type 2 diabetes mellitus with other specified complication: Secondary | ICD-10-CM | POA: Diagnosis not present

## 2020-07-22 DIAGNOSIS — E785 Hyperlipidemia, unspecified: Secondary | ICD-10-CM

## 2020-07-22 DIAGNOSIS — M62838 Other muscle spasm: Secondary | ICD-10-CM

## 2020-07-22 DIAGNOSIS — I10 Essential (primary) hypertension: Secondary | ICD-10-CM | POA: Diagnosis not present

## 2020-07-22 DIAGNOSIS — N62 Hypertrophy of breast: Secondary | ICD-10-CM | POA: Insufficient documentation

## 2020-07-22 DIAGNOSIS — J4520 Mild intermittent asthma, uncomplicated: Secondary | ICD-10-CM | POA: Diagnosis not present

## 2020-07-22 DIAGNOSIS — IMO0002 Reserved for concepts with insufficient information to code with codable children: Secondary | ICD-10-CM

## 2020-07-22 DIAGNOSIS — Z23 Encounter for immunization: Secondary | ICD-10-CM | POA: Diagnosis not present

## 2020-07-22 DIAGNOSIS — J4540 Moderate persistent asthma, uncomplicated: Secondary | ICD-10-CM

## 2020-07-22 DIAGNOSIS — E1165 Type 2 diabetes mellitus with hyperglycemia: Secondary | ICD-10-CM | POA: Diagnosis not present

## 2020-07-22 DIAGNOSIS — M25511 Pain in right shoulder: Secondary | ICD-10-CM

## 2020-07-22 DIAGNOSIS — E1151 Type 2 diabetes mellitus with diabetic peripheral angiopathy without gangrene: Secondary | ICD-10-CM

## 2020-07-22 DIAGNOSIS — G8929 Other chronic pain: Secondary | ICD-10-CM

## 2020-07-22 DIAGNOSIS — E669 Obesity, unspecified: Secondary | ICD-10-CM

## 2020-07-22 MED ORDER — CYCLOBENZAPRINE HCL 10 MG PO TABS: ORAL_TABLET | ORAL | 2 refills | Status: DC

## 2020-07-22 MED ORDER — SYMBICORT 160-4.5 MCG/ACT IN AERO: 2.0000 | INHALATION_SPRAY | Freq: Two times a day (BID) | RESPIRATORY_TRACT | 3 refills | Status: DC

## 2020-07-22 NOTE — Assessment & Plan Note (Signed)
-   Refer to healthy weight and wellness 

## 2020-07-22 NOTE — Patient Instructions (Signed)
Carbohydrate Counting for Diabetes Mellitus, Adult  Carbohydrate counting is a method of keeping track of how many carbohydrates you eat. Eating carbohydrates naturally increases the amount of sugar (glucose) in the blood. Counting how many carbohydrates you eat helps keep your blood glucose within normal limits, which helps you manage your diabetes (diabetes mellitus). It is important to know how many carbohydrates you can safely have in each meal. This is different for every person. A diet and nutrition specialist (registered dietitian) can help you make a meal plan and calculate how many carbohydrates you should have at each meal and snack. Carbohydrates are found in the following foods:  Grains, such as breads and cereals.  Dried beans and soy products.  Starchy vegetables, such as potatoes, peas, and corn.  Fruit and fruit juices.  Milk and yogurt.  Sweets and snack foods, such as cake, cookies, candy, chips, and soft drinks. How do I count carbohydrates? There are two ways to count carbohydrates in food. You can use either of the methods or a combination of both. Reading "Nutrition Facts" on packaged food The "Nutrition Facts" list is included on the labels of almost all packaged foods and beverages in the [place] It includes:  The serving size.  Information about nutrients in each serving, including the grams (g) of carbohydrate per serving. To use the "Nutrition Facts":  Decide how many servings you will have.  Multiply the number of servings by the number of carbohydrates per serving.  The resulting number is the total amount of carbohydrates that you will be having. Learning standard serving sizes of other foods When you eat carbohydrate foods that are not packaged or do not include "Nutrition Facts" on the label, you need to measure the servings in order to count the amount of carbohydrates:  Measure the foods that you will eat with a food scale or measuring cup, if  needed.  Decide how many standard-size servings you will eat.  Multiply the number of servings by 15. Most carbohydrate-rich foods have about 15 g of carbohydrates per serving. ? For example, if you eat 8 oz (170 g) of strawberries, you will have eaten 2 servings and 30 g of carbohydrates (2 servings x 15 g = 30 g).  For foods that have more than one food mixed, such as soups and casseroles, you must count the carbohydrates in each food that is included. The following list contains standard serving sizes of common carbohydrate-rich foods. Each of these servings has about 15 g of carbohydrates:   hamburger bun or  English muffin.   oz (15 mL) syrup.   oz (14 g) jelly.  1 slice of bread.  1 six-inch tortilla.  3 oz (85 g) cooked rice or pasta.  4 oz (113 g) cooked dried beans.  4 oz (113 g) starchy vegetable, such as peas, corn, or potatoes.  4 oz (113 g) hot cereal.  4 oz (113 g) mashed potatoes or  of a large baked potato.  4 oz (113 g) canned or frozen fruit.  4 oz (120 mL) fruit juice.  4-6 crackers.  6 chicken nuggets.  6 oz (170 g) unsweetened dry cereal.  6 oz (170 g) plain fat-free yogurt or yogurt sweetened with artificial sweeteners.  8 oz (240 mL) milk.  8 oz (170 g) fresh fruit or one small piece of fruit.  24 oz (680 g) popped popcorn. Example of carbohydrate counting Sample meal  3 oz (85 g) chicken breast.  6 oz (170 g)   [place] rice.  4 oz (113 g) corn.  8 oz (240 mL) milk.  8 oz (170 g) strawberries with sugar-free whipped topping. Carbohydrate calculation 1. Identify the foods that contain carbohydrates: ? Rice. ? Corn. ? Milk. ? Strawberries. 2. Calculate how many servings you have of each food: ? 2 servings rice. ? 1 serving corn. ? 1 serving milk. ? 1 serving strawberries. 3. Multiply each number of servings by 15 g: ? 2 servings rice x 15 g = 30 g. ? 1 serving corn x 15 g = 15 g. ? 1 serving milk x 15 g = 15 g. ? 1  serving strawberries x 15 g = 15 g. 4. Add together all of the amounts to find the total grams of carbohydrates eaten: ? 30 g + 15 g + 15 g + 15 g = 75 g of carbohydrates total. Summary  Carbohydrate counting is a method of keeping track of how many carbohydrates you eat.  Eating carbohydrates naturally increases the amount of sugar (glucose) in the blood.  Counting how many carbohydrates you eat helps keep your blood glucose within normal limits, which helps you manage your diabetes.  A diet and nutrition specialist (registered dietitian) can help you make a meal plan and calculate how many carbohydrates you should have at each meal and snack. This information is not intended to replace advice given to you by your health care provider. Make sure you discuss any questions you have with your health care provider. Document Revised: 05/17/2017 Document Reviewed: 04/05/2016 Elsevier Patient Education  2020 [employer] Inc[employer]  

## 2020-07-22 NOTE — Assessment & Plan Note (Signed)
Stable Refill inhalers  

## 2020-07-22 NOTE — Assessment & Plan Note (Signed)
Causing neck/ shoulder pain Refer to plastic surgery for possible breast reduction

## 2020-07-22 NOTE — Progress Notes (Signed)
Patient ID: Rhonda Morrow, female    DOB: June 07, 1961  Age: 60 y.o. MRN: [id number]    Subjective:  Subjective  HPI Rhonda Morrow presents for f/u dm, chol and bp She has a lot of neck and shoulder pain due to enlarged breasts    HYPERTENSION   Blood pressure range-not checking   Chest pain- no      Dyspnea- no Lightheadedness- no   Edema- no  Other side effects - no   Medication compliance: good Low salt diet- yes     DIABETES    Blood Sugar ranges-not checking   Polyuria- no New Visual problems- no  Hypoglycemic symptoms- no  Other side effects-no Medication compliance - good Last eye exam- July 2021    HYPERLIPIDEMIA  Medication compliance- good RUQ pain- no  Muscle aches- no Other side effects-no   Review of Systems  Constitutional: Negative for appetite change, diaphoresis, fatigue and unexpected weight change.  Eyes: Negative for pain, redness and visual disturbance.  Respiratory: Negative for cough, chest tightness, shortness of breath and wheezing.   Cardiovascular: Negative for chest pain, palpitations and leg swelling.  Endocrine: Negative for cold intolerance, heat intolerance, polydipsia, polyphagia and polyuria.  Genitourinary: Negative for difficulty urinating, dysuria and frequency.  Musculoskeletal: Positive for back pain.  Neurological: Negative for dizziness, light-headedness, numbness and headaches.    History Past Medical History:  Diagnosis Date  . Arthritis   . Asthma   . Complication of anesthesia    one time woke up and was vey scared,17 yrs ago  . Deaf   . Diabetes mellitus without complication ([place])   . GERD (gastroesophageal reflux disease)   . Hypertension   . Left groin pain   . Thyroid disease     She has a past surgical history that includes Cesarean section; Cardiac catheterization; Esophageal manometry (N/A, 09/29/2013); Cholecystectomy (N/A, 06/20/2016); and Total hip arthroplasty (Left, 02/21/2017).   Her family history  includes Diabetes in her father and mother; Heart disease in her mother.She reports that she has never smoked. She has never used smokeless tobacco. She reports that she does not drink alcohol and does not use drugs.  Current Outpatient Medications on File Prior to Visit  Medication Sig Dispense Refill  . allopurinol (ZYLOPRIM) 100 MG tablet Take 1 tablet (100 mg total) by mouth daily. 90 tablet 3  . amLODipine (NORVASC) 5 MG tablet Take 1 tablet (5 mg total) by mouth daily. 90 tablet 1  . aspirin EC 325 MG tablet Take 1 tablet (325 mg total) by mouth 2 (two) times daily. 84 tablet 0  . azelastine (ASTELIN) 0.1 % nasal spray Place 2 sprays into both nostrils 2 (two) times daily. Use in each nostril as directed 30 mL 3  . benzonatate (TESSALON) 200 MG capsule Take 1 capsule (200 mg total) by mouth 3 (three) times daily as needed for cough. 30 capsule 1  . Calcium Carbonate-Vit D-Min (CALCIUM 1200) 1200-1000 MG-UNIT CHEW 1 po qd 30 tablet 11  . cefdinir (OMNICEF) 300 MG capsule Take 1 capsule (300 mg total) by mouth 2 (two) times daily. 20 capsule 0  . cetirizine (ZYRTEC) 10 MG tablet TAKE 1 TABLET (10 MG TOTAL) BY MOUTH DAILY. 90 tablet 1  . clarithromycin (BIAXIN XL) 500 MG 24 hr tablet Take 2 tablets (1,000 mg total) by mouth daily. 14 tablet 0  . colchicine 0.6 MG tablet Take 1 tablet (0.6 mg total) by mouth daily. 90 tablet 1  . Cyanocobalamin (B-12)  1000 MCG SUBL Dissolove 1 tablet under tongue once a day 90 each 1  . diclofenac (VOLTAREN) 75 MG EC tablet TAKE 1 TABLET (75 MG TOTAL) BY MOUTH 2 (TWO) TIMES DAILY AS NEEDED. 60 tablet 3  . diclofenac sodium (VOLTAREN) 1 % GEL Apply 4 g topically 4 (four) times daily as needed. 500 g 6  . Fiber CHEW Chew 1 tablet by mouth daily.    . fluticasone (FLONASE) 50 MCG/ACT nasal spray Place 2 sprays into both nostrils daily. 16 g 6  . fluticasone (FLONASE) 50 MCG/ACT nasal spray SPRAY 2 SPRAYS INTO EACH NOSTRIL EVERY DAY 48 mL 1  . glucose blood test  strip Use as instructed--- one touch verio test strips 100 each 12  . hydrochlorothiazide (HYDRODIURIL) 12.5 MG tablet Take 1 tablet (12.5 mg total) by mouth daily. 90 tablet 1  . hydrOXYzine (ATARAX/VISTARIL) 10 MG tablet TAKE 1 TO 2 TABLETS BY MOUTH AT BEDTIME AS NEEDED FOR ITCHING 180 tablet 1  . ipratropium-albuterol (DUONEB) 0.5-2.5 (3) MG/3ML SOLN Take 3 mLs by nebulization every 6 (six) hours as needed. 360 mL 3  . Lancets (ONETOUCH ULTRASOFT) lancets Use as instructed 100 each 12  . meclizine (ANTIVERT) 12.5 MG tablet Take 1 tablet (12.5 mg total) by mouth 3 (three) times daily as needed for dizziness. 30 tablet 0  . metaxalone (SKELAXIN) 800 MG tablet TAKE 1 TABLET BY MOUTH THREE TIMES A DAY (Patient taking differently: Take 800 mg by mouth 3 (three) times daily. ) 30 tablet 2  . metFORMIN (GLUCOPHAGE-XR) 500 MG 24 hr tablet Take 1 tablet (500 mg total) by mouth daily with breakfast. 90 tablet 1  . mometasone-formoterol (DULERA) 100-5 MCG/ACT AERO Inhale 2 puffs into the lungs 2 (two) times daily. 1 Inhaler PRN  . montelukast (SINGULAIR) 10 MG tablet Take 1 tablet (10 mg total) by mouth at bedtime. 90 tablet 3  . Multiple Vitamin (MULTIVITAMIN) tablet Take 1 tablet by mouth daily.    . NONFORMULARY OR COMPOUNDED ITEM Nebulizer   DX ASTHMA 1 each 0  . nystatin-triamcinolone (MYCOLOG II) cream Apply twice daily to lower chest area/upper abdomen 30 g 0  . ofloxacin (FLOXIN OTIC) 0.3 % OTIC solution Place 10 drops into the right ear daily. 10 mL 0  . omeprazole (PRILOSEC) 20 MG capsule Take 1 capsule (20 mg total) by mouth daily. 30 capsule 3  . ondansetron (ZOFRAN ODT) 4 MG disintegrating tablet Take 1 tablet (4 mg total) by mouth every 8 (eight) hours as needed for nausea or vomiting. 20 tablet 0  . pantoprazole (PROTONIX) 40 MG tablet Take 1 tablet (40 mg total) by mouth daily. 30 tablet 3  . potassium chloride SA (KLOR-CON M20) 20 MEQ tablet Take 1 tablet (20 mEq total) by mouth daily. 90  tablet 1  . ranitidine (ZANTAC) 150 MG tablet TAKE 1 TABLET BY MOUTH TWICE A DAY (Patient taking differently: Take 150 mg by mouth 2 (two) times daily. ) 180 tablet 1  . rOPINIRole (REQUIP) 0.25 MG tablet TAKE 1 TABLET BY MOUTH AT BEDTIME FOR 4 DAYS THEN INCREASE TO 2 TABLETS AT BEDTIME IF NEEDED 60 tablet 0  . Spacer/Aero-Holding Chambers (AEROCHAMBER MV) inhaler Use as instructed 1 each 0  . traMADol (ULTRAM) 50 MG tablet TAKE 1 TABLET (50 MG TOTAL) BY MOUTH EVERY 8 (EIGHT) HOURS AS NEEDED FOR UP TO 5 DAYS. 16 tablet 0  . triamcinolone cream (KENALOG) 0.1 % Apply bid to hands 30 g 0  . VENTOLIN HFA  108 (90 Base) MCG/ACT inhaler Inhale 2 puffs into the lungs every 6 (six) hours as needed for wheezing or shortness of breath. 18 g 5   No current facility-administered medications on file prior to visit.     Objective:  Objective  Physical Exam Vitals and nursing note reviewed.  Constitutional:      Appearance: She is well-developed.  HENT:     Head: Normocephalic and atraumatic.  Eyes:     Conjunctiva/sclera: Conjunctivae normal.  Neck:     Thyroid: No thyromegaly.     Vascular: No carotid bruit or JVD.  Cardiovascular:     Rate and Rhythm: Normal rate and regular rhythm.     Heart sounds: Normal heart sounds. No murmur heard.   Pulmonary:     Effort: Pulmonary effort is normal. No respiratory distress.     Breath sounds: Normal breath sounds. No wheezing or rales.  Chest:     Chest wall: No tenderness.  Musculoskeletal:     Cervical back: Normal range of motion and neck supple.  Neurological:     Mental Status: She is alert and oriented to person, place, and time.    BP 126/80 (BP Location: Left Arm, Patient Position: Sitting, Cuff Size: Large)   Pulse 80   Temp 97.9 F (36.6 C) (Oral)   Resp 20   Ht 5\' 4"  (1.626 m)   Wt 235 lb (106.6 kg)   LMP 05/20/2012   SpO2 99%   BMI 40.34 kg/m  Wt Readings from Last 3 Encounters:  07/22/20 235 lb (106.6 kg)  06/21/20 231 lb  (104.8 kg)  06/01/20 (!) 230 lb (104.3 kg)     Lab Results  Component Value Date   WBC 5.2 06/01/2020   HGB 13.2 06/01/2020   HCT 40.5 06/01/2020   PLT 168.0 06/01/2020   GLUCOSE 112 (H) 07/22/2020   CHOL 165 07/22/2020   TRIG 80 07/22/2020   HDL 57 07/22/2020   LDLCALC 91 07/22/2020   ALT 14 07/22/2020   AST 16 07/22/2020   NA 139 07/22/2020   K 3.7 07/22/2020   CL 101 07/22/2020   CREATININE 0.76 07/22/2020   BUN 12 07/22/2020   CO2 28 07/22/2020   TSH 0.74 04/20/2020   INR 1.05 02/12/2017   HGBA1C 6.8 (H) 07/22/2020   MICROALBUR 0.5 07/22/2020    DG Chest 2 View  Result Date: 06/01/2020 CLINICAL DATA:  59 year old female with 2 weeks of epigastric pain. Cough and vomiting. EXAM: CHEST - 2 VIEW COMPARISON:  Chest radiograph 05/24/2020 and earlier. FINDINGS: Chronically low lung volumes are stable. Normal cardiac size and mediastinal contours. Visualized tracheal air column is within normal limits. The lungs appear stable and clear. No pneumothorax or pleural effusion. No acute osseous abnormality identified. Negative visible bowel gas pattern. Stable small surgical clips in the upper abdomen on the lateral view. IMPRESSION: Chronically low lung volumes.  No cardiopulmonary abnormality. Electronically Signed   By: Genevie Ann M.D.   On: 06/01/2020 17:33     Assessment & Plan:  Plan  I am having Noele A. Owens Shark maintain her multivitamin, Fiber, NONFORMULARY OR COMPOUNDED ITEM, azelastine, aspirin EC, metaxalone, meclizine, ranitidine, diclofenac sodium, mometasone-formoterol, montelukast, ipratropium-albuterol, AeroChamber MV, pantoprazole, B-12, triamcinolone cream, nystatin-triamcinolone, colchicine, traMADol, fluticasone, diclofenac, hydrOXYzine, cetirizine, amLODipine, hydrochlorothiazide, potassium chloride SA, metFORMIN, onetouch ultrasoft, glucose blood, Calcium 1200, Ventolin HFA, cefdinir, ofloxacin, allopurinol, ondansetron, fluticasone, omeprazole, clarithromycin,  benzonatate, rOPINIRole, Symbicort, and cyclobenzaprine.  Meds ordered this encounter  Medications  . SYMBICORT 160-4.5  MCG/ACT inhaler    Sig: Inhale 2 puffs into the lungs every 12 (twelve) hours.    Dispense:  1 each    Refill:  3    Order Specific Question:   Lot Number?    Answer:   [id number]    Order Specific Question:   Expiration Date?    Answer:   11/27/2019    Order Specific Question:   Manufacturer?    Answer:   [employer] [[id number]]    Order Specific Question:   Quantity    Answer:   1  . cyclobenzaprine (FLEXERIL) 10 MG tablet    Sig: TAKE 1 TABLET BY MOUTH THREE TIMES A DAY AS NEEDED FOR MUSCLE SPASMS    Dispense:  30 tablet    Refill:  2    Problem List Items Addressed This Visit      Unprioritized   Asthma in adult    Stable Refill inhalers       Relevant Medications   SYMBICORT 160-4.5 MCG/ACT inhaler   Chronic right shoulder pain   Relevant Medications   cyclobenzaprine (FLEXERIL) 10 MG tablet   Diabetes mellitus, type II (HCC) - Primary   Relevant Orders   Lipid panel (Completed)   Hemoglobin A1c (Completed)   Comprehensive metabolic panel (Completed)   Microalbumin / creatinine urine ratio (Completed)   DM (diabetes mellitus) type II uncontrolled, periph vascular disorder (HCC)   HTN (hypertension)    Well controlled, no changes to meds. Encouraged heart healthy diet such as the DASH diet and exercise as tolerated.       Relevant Orders   Lipid panel (Completed)   Hemoglobin A1c (Completed)   Comprehensive metabolic panel (Completed)   Microalbumin / creatinine urine ratio (Completed)   Hyperlipidemia associated with type 2 diabetes mellitus (HCC)    Encouraged heart healthy diet, increase exercise, avoid trans fats, consider a krill oil cap daily      Relevant Orders   Lipid panel (Completed)   Hemoglobin A1c (Completed)   Comprehensive metabolic panel (Completed)   Microalbumin / creatinine urine ratio (Completed)   Large breasts     Causing neck/ shoulder pain Refer to plastic surgery for possible breast reduction       Relevant Orders   Ambulatory referral to Plastic Surgery   Moderate persistent asthma without complication    Stable  Refill symbicort      Relevant Medications   SYMBICORT 160-4.5 MCG/ACT inhaler   Obesity    Refer to healthy weight and wellness       Other Visit Diagnoses    Need for influenza vaccination       Relevant Orders   Flu Vaccine QUAD 36+ mos IM (Completed)   Mild intermittent asthma, unspecified whether complicated       Relevant Medications   SYMBICORT 160-4.5 MCG/ACT inhaler   Muscle spasm       Relevant Medications   cyclobenzaprine (FLEXERIL) 10 MG tablet   Morbid obesity (HCC)       Relevant Orders   Amb Ref to Medical Weight Management   Need for pneumococcal vaccination       Relevant Orders   Pneumococcal polysaccharide vaccine 23-valent greater than or equal to 2yo subcutaneous/IM (Completed)      Follow-up: Return in about 6 months (around 01/19/2021), or if symptoms worsen or fail to improve, for annual exam, fasting.  would like app for shingrix #1 as well , fasting.  Ann Held, DO

## 2020-07-22 NOTE — Assessment & Plan Note (Signed)
Stable  Refill symbicort

## 2020-07-23 LAB — COMPREHENSIVE METABOLIC PANEL
AG Ratio: 1.2 (calc) (ref 1.0–2.5)
ALT: 14 U/L (ref 6–29)
AST: 16 U/L (ref 10–35)
Albumin: 3.7 g/dL (ref 3.6–5.1)
Alkaline phosphatase (APISO): 84 U/L (ref 37–153)
BUN: 12 mg/dL (ref 7–25)
CO2: 28 mmol/L (ref 20–32)
Calcium: 9.2 mg/dL (ref 8.6–10.4)
Chloride: 101 mmol/L (ref 98–110)
Creat: 0.76 mg/dL (ref 0.50–1.05)
Globulin: 3 g/dL (calc) (ref 1.9–3.7)
Glucose, Bld: 112 mg/dL — ABNORMAL HIGH (ref 65–99)
Potassium: 3.7 mmol/L (ref 3.5–5.3)
Sodium: 139 mmol/L (ref 135–146)
Total Bilirubin: 0.4 mg/dL (ref 0.2–1.2)
Total Protein: 6.7 g/dL (ref 6.1–8.1)

## 2020-07-23 LAB — LIPID PANEL
Cholesterol: 165 mg/dL (ref <200)
HDL: 57 mg/dL (ref >=50)
LDL Cholesterol (Calc): 91 mg/dL (calc)
Non-HDL Cholesterol (Calc): 108 mg/dL (calc) (ref <130)
Total CHOL/HDL Ratio: 2.9 (calc) (ref <5.0)
Triglycerides: 80 mg/dL (ref <150)

## 2020-07-23 LAB — HEMOGLOBIN A1C
Hgb A1c MFr Bld: 6.8 % of total Hgb — ABNORMAL HIGH (ref <5.7)
Mean Plasma Glucose: 148 (calc)
eAG (mmol/L): 8.2 (calc)

## 2020-07-23 LAB — MICROALBUMIN / CREATININE URINE RATIO
Creatinine, Urine: 61 mg/dL (ref 20–275)
Microalb Creat Ratio: 8 mcg/mg creat (ref <30)
Microalb, Ur: 0.5 mg/dL

## 2020-07-25 DIAGNOSIS — E1169 Type 2 diabetes mellitus with other specified complication: Secondary | ICD-10-CM | POA: Insufficient documentation

## 2020-07-25 DIAGNOSIS — E785 Hyperlipidemia, unspecified: Secondary | ICD-10-CM | POA: Insufficient documentation

## 2020-07-25 NOTE — Assessment & Plan Note (Signed)
Encouraged heart healthy diet, increase exercise, avoid trans fats, consider a krill oil cap daily 

## 2020-07-25 NOTE — Assessment & Plan Note (Signed)
Well controlled, no changes to meds. Encouraged heart healthy diet such as the DASH diet and exercise as tolerated.  °

## 2020-07-26 ENCOUNTER — Encounter (INDEPENDENT_AMBULATORY_CARE_PROVIDER_SITE_OTHER): Payer: Self-pay

## 2020-07-26 NOTE — Telephone Encounter (Signed)
This is for you!

## 2020-07-27 ENCOUNTER — Encounter (INDEPENDENT_AMBULATORY_CARE_PROVIDER_SITE_OTHER): Payer: Self-pay

## 2020-08-03 ENCOUNTER — Encounter: Payer: Self-pay | Admitting: Neurology

## 2020-08-03 ENCOUNTER — Telehealth: Payer: Self-pay | Admitting: Neurology

## 2020-08-03 DIAGNOSIS — G478 Other sleep disorders: Secondary | ICD-10-CM | POA: Insufficient documentation

## 2020-08-03 DIAGNOSIS — R351 Nocturia: Secondary | ICD-10-CM | POA: Insufficient documentation

## 2020-08-03 DIAGNOSIS — R252 Cramp and spasm: Secondary | ICD-10-CM | POA: Insufficient documentation

## 2020-08-03 NOTE — Telephone Encounter (Signed)
Pt returned call with her sign language interpretor and I advised pt that Dr. Brett Fairy reviewed their sleep study results and found that has overall mild sleep apnea and recommends that pt be treated with a cpap. Dr. Brett Fairy recommends that pt return for a repeat sleep study in order to properly titrate the cpap and ensure a good mask fit. Pt is agreeable to returning for a titration study. I advised pt that our sleep lab will file with pt's insurance and call pt to schedule the sleep study when we hear back from the pt's insurance regarding coverage of this sleep study. Pt verbalized understanding of results. Pt had no questions at this time but was encouraged to call back if questions arise.

## 2020-08-03 NOTE — Telephone Encounter (Signed)
Called the pt to review SSR. No answer, LVM for pt to call back. Ill send a mychart message as well.

## 2020-08-03 NOTE — Telephone Encounter (Signed)
Called the patient back to discuss sleep results.  And received voice mail again. Left a message through interpretor.

## 2020-08-03 NOTE — Telephone Encounter (Signed)
Pt returned phone call with interpreter for the hearing impaired. Would like a call back.

## 2020-08-03 NOTE — Procedures (Signed)
PATIENTS NAME:  Rhonda Morrow, Ciccone DOB:      1961-07-11      MR#:    [id number]     DATE OF RECORDING: 07/14/2020 REFERRING M.D.:  Garnet Koyanagi -Cheri Rous, DO Study Performed:   Baseline Polysomnogram HISTORY:  I have the pleasure of seeing Rhonda Morrow today, a right -handed African American deaf female with a possible sleep disorder.  She has a past medical history of Arthritis, Asthma, Complication of anesthesia, Deaf, Diabetes mellitus without complication ([place]), GERD (gastroesophageal reflux disease), Hypertension, Left groin pain, and Thyroid disease.  Dr Posey Pronto has worked this patient up for hyperreflexia. DM2.  Left hip surgery- hip replacement.  Sleep relevant medical history: Nocturia 3 times, urge incontinence. RLS - Hypersomnia, obesity, and leg cramping at night.    The patient endorsed the Epworth Sleepiness Scale at 10 points.   The patients weight 231 pounds with a height of 64 (inches), resulting in a BMI of 39.5 kg/m2. The patients neck circumference measured 17 inches.  CURRENT MEDICATIONS: Tessalon, Ventolin, Kenalog, Ultram, Symbicort, Requip, Zantac, Klor-Con, Protonix, Zofran, Prilosec, ASA, Astelin, Norvasc, Singulair, Dulera, Glucophage, Antivert, Duoneb, Hydroxyzine, Flonase, Voltaren gel, Flexeril, B-12, Colchicine   PROCEDURE:  This is a multichannel digital polysomnogram utilizing the Somnostar 11.2 system.  Electrodes and sensors were applied and monitored per AASM Specifications.   EEG, EOG, Chin and Limb EMG, were sampled at 200 Hz.  ECG, Snore and Nasal Pressure, Thermal Airflow, Respiratory Effort, CPAP Flow and Pressure, Oximetry was sampled at 50 Hz. Digital video and audio were recorded.      BASELINE STUDY: Lights Out was at 23:03 and Lights On at 05:07.  Total recording time (TRT) was 364.5 minutes, with a total sleep time (TST) of 331 minutes.   The patients sleep latency was 8.5 minutes.  REM latency was 93 minutes.  The sleep efficiency was 90.8 %.     SLEEP  ARCHITECTURE: WASO (Wake after sleep onset) was 24.5 minutes.  There were 13.5 minutes in Stage N1, 239.5 minutes Stage N2, 22 minutes Stage N3 and 56 minutes in Stage REM.  The percentage of Stage N1 was 4.1%, Stage N2 was 72.4%, Stage N3 was 6.6% and Stage R (REM sleep) was 16.9%.   RESPIRATORY ANALYSIS:  There were a total of 79 respiratory events:  0 obstructive apneas, 0 central apneas and 0 mixed apneas with a total of 0 apneas and an apnea index (AI) of 0 /hour. There were 79 hypopneas with a hypopnea index of 14.3 /hour.  The total APNEA/HYPOPNEA INDEX (AHI) was 14.3/hour.  44 events occurred in REM sleep and 70 events in NREM. The REM AHI was 47.1 /hour, versus a non-REM AHI of 7.6/h. The patient spent 245.5 minutes of total sleep time in the supine position and 86 minutes in non-supine. The supine AHI was 16.4/h versus a non-supine AHI of 8.4/h.  OXYGEN SATURATION & C02:  The Wake baseline 02 saturation was 98%, with the lowest being 84%. Time spent below 89% saturation equaled 5 minutes. The arousals were noted as: 38 were spontaneous, 0 were associated with PLMs, 50 were associated with respiratory events. The patient had a total of 0 Periodic Limb Movements.    Audio and video analysis did not show any abnormal or unusual movements, behaviors, phonations or vocalizations.   Snoring was noted. EKG with NSR and PVCs, also runs of bigeminy. See screen shot.    IMPRESSION:  1. The patient has mild Obstructive Sleep Apnea (OSA) at AHI  of 14.3/h. 2. Snoring was present. 3. Non-specific abnormal EKG. 4. There was no report of leg cramping that night.    RECOMMENDATIONS:  1. Advise full night, attended, CPAP titration study to optimize therapy.     I certify that I have reviewed the entire raw data recording prior to the issuance of this report in accordance with the Standards of Accreditation of the American Academy of Sleep Medicine (AASM)      Larey Seat, MD Diplomat,  American Board of Neurology  Medical Director, [facility] [employer], [employer] [facility]

## 2020-08-03 NOTE — Addendum Note (Signed)
Addended by: Larey Seat on: 08/03/2020 08:23 AM   Modules accepted: Orders

## 2020-08-03 NOTE — Telephone Encounter (Signed)
-----   Message from Larey Seat, MD sent at 08/03/2020  8:23 AM EDT ----- IMPRESSION:   1. The patient has mild Obstructive Sleep Apnea (OSA) at AHI of  14.3/h.  2. Snoring was present.  3. Non-specific abnormal EKG.  4. There was no report of leg cramping that night.     RECOMMENDATIONS:   1. Advise full night, attended, CPAP titration study to optimize  therapy.  If insurance doesn't allow to bring patient back- will use autotitration device with a setting from 6-16 cm water and 1 cm EPR. Mask fitted to patient's comfort.

## 2020-08-11 ENCOUNTER — Other Ambulatory Visit: Payer: Self-pay | Admitting: Family Medicine

## 2020-08-11 NOTE — Telephone Encounter (Signed)
Med not currently on med list. Please advise

## 2020-08-12 ENCOUNTER — Telehealth: Payer: Self-pay

## 2020-08-12 NOTE — Telephone Encounter (Signed)
LVM to schedule sleep study ?

## 2020-08-14 ENCOUNTER — Other Ambulatory Visit: Payer: Self-pay | Admitting: Family Medicine

## 2020-08-14 DIAGNOSIS — G2581 Restless legs syndrome: Secondary | ICD-10-CM

## 2020-08-17 ENCOUNTER — Other Ambulatory Visit (HOSPITAL_BASED_OUTPATIENT_CLINIC_OR_DEPARTMENT_OTHER): Payer: Self-pay | Admitting: Family Medicine

## 2020-08-17 ENCOUNTER — Ambulatory Visit (INDEPENDENT_AMBULATORY_CARE_PROVIDER_SITE_OTHER): Payer: Federal, State, Local not specified - PPO | Admitting: *Deleted

## 2020-08-17 ENCOUNTER — Telehealth: Payer: Self-pay

## 2020-08-17 ENCOUNTER — Other Ambulatory Visit: Payer: Self-pay

## 2020-08-17 DIAGNOSIS — Z23 Encounter for immunization: Secondary | ICD-10-CM | POA: Diagnosis not present

## 2020-08-17 DIAGNOSIS — Z1231 Encounter for screening mammogram for malignant neoplasm of breast: Secondary | ICD-10-CM

## 2020-08-17 NOTE — Progress Notes (Signed)
Patient here for shingles vaccine.  Vaccine given in left deltoid and patient tolerated well.

## 2020-08-17 NOTE — Telephone Encounter (Signed)
Tried returning pt's call to get scheduled for CPAP study. Unable to reach pt. LVM, asking to give me a call back at my direct number.

## 2020-08-23 ENCOUNTER — Other Ambulatory Visit (HOSPITAL_BASED_OUTPATIENT_CLINIC_OR_DEPARTMENT_OTHER): Payer: Self-pay | Admitting: Internal Medicine

## 2020-08-23 ENCOUNTER — Other Ambulatory Visit: Payer: Self-pay | Admitting: Family Medicine

## 2020-08-23 ENCOUNTER — Ambulatory Visit: Payer: Federal, State, Local not specified - PPO | Attending: Internal Medicine

## 2020-08-23 DIAGNOSIS — R059 Cough, unspecified: Secondary | ICD-10-CM

## 2020-08-23 DIAGNOSIS — Z23 Encounter for immunization: Secondary | ICD-10-CM

## 2020-08-23 NOTE — Progress Notes (Signed)
   Covid-19 Vaccination Clinic  Name:  Rhonda Morrow    MRN: [id number] DOB: [birth year]  08/23/2020  [name] was observed post Covid-19 immunization for 15 minutes without incident. She was provided with Vaccine Information Sheet and instruction to access the V-Safe system.  Vaccinated by [facility] Cloyce Paterson.  [name] was instructed to call 911 with any severe reactions post vaccine: Marland Kitchen Difficulty breathing  . Swelling of face and throat  . A fast heartbeat  . A bad rash all over body  . Dizziness and weakness

## 2020-08-24 ENCOUNTER — Ambulatory Visit (INDEPENDENT_AMBULATORY_CARE_PROVIDER_SITE_OTHER): Payer: Federal, State, Local not specified - PPO | Admitting: Family Medicine

## 2020-08-30 ENCOUNTER — Ambulatory Visit (INDEPENDENT_AMBULATORY_CARE_PROVIDER_SITE_OTHER): Payer: Federal, State, Local not specified - PPO | Admitting: Neurology

## 2020-08-30 DIAGNOSIS — G478 Other sleep disorders: Secondary | ICD-10-CM

## 2020-08-30 DIAGNOSIS — G4733 Obstructive sleep apnea (adult) (pediatric): Secondary | ICD-10-CM | POA: Diagnosis not present

## 2020-08-30 DIAGNOSIS — R0602 Shortness of breath: Secondary | ICD-10-CM

## 2020-08-30 DIAGNOSIS — Z6841 Body Mass Index (BMI) 40.0 and over, adult: Secondary | ICD-10-CM

## 2020-08-30 DIAGNOSIS — G2581 Restless legs syndrome: Secondary | ICD-10-CM

## 2020-08-30 DIAGNOSIS — J4540 Moderate persistent asthma, uncomplicated: Secondary | ICD-10-CM

## 2020-08-30 DIAGNOSIS — R252 Cramp and spasm: Secondary | ICD-10-CM

## 2020-08-31 MED FILL — PFIZER-BIONTECH COVID-19 VA: 30 | 1 days supply | Qty: 0 | Fill #0

## 2020-09-03 ENCOUNTER — Other Ambulatory Visit: Payer: Self-pay | Admitting: Family Medicine

## 2020-09-07 ENCOUNTER — Ambulatory Visit (INDEPENDENT_AMBULATORY_CARE_PROVIDER_SITE_OTHER): Payer: Federal, State, Local not specified - PPO | Admitting: Family Medicine

## 2020-09-12 DIAGNOSIS — G4733 Obstructive sleep apnea (adult) (pediatric): Secondary | ICD-10-CM | POA: Insufficient documentation

## 2020-09-12 NOTE — Procedures (Signed)
PATIENT'S NAME:  Rhonda Morrow, Maiden DOB:      03-12-1961      MR#:               [id number]     DATE OF RECORDING: 08/30/2020 AL REFERRING M.D.:  Garnet Koyanagi -[place]view, DO and Narda Amber, DO  Study Performed:   Titration to PAP therapy HISTORY:  Rhonda Morrow. Shuping is returning to the sleep lab for a positive airway pressure titration study following her PSG performed on 9/8/21whihc resulted in a diagnosis of mild OSA with AHI of 14.3/h and Spo2 nadir of 84%. 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 The patient is deaf and had an abnormal ECG in her baseline0 multiple PVCs.    The patient endorsed the Epworth Sleepiness Scale at 10 points.   The patient's weight 231 pounds with a height of 64 (inches), resulting in a BMI of 39.5 kg/m2. The patient's neck circumference measured 17 inches.  CURRENT MEDICATIONS: Zyloprim, Norvasc, Aspirin, Astelin, Tessalon, Omnicef, Zyrtec, Biaxin, Flexeril, Voltaren, Flonase, Hydrodiuril, Atarax, Antivert, Skelaxin, Metformin, Dulera,Zofran, Protonix, Zantac, Requip, Ultram, Symbicort, Kenalog,    PROCEDURE:  This is a multichannel digital polysomnogram utilizing the SomnoStar 11.2 system.  Electrodes and sensors were applied and monitored per AASM Specifications.   EEG, EOG, Chin and Limb EMG, were sampled at 200 Hz.  ECG, Snore and Nasal Pressure, Thermal Airflow, Respiratory Effort, CPAP Flow and Pressure, Oximetry was sampled at 50 Hz. Digital video and audio were recorded.       Acclimatization was given first, as the patient had displayed some fear over using CPAP. CPAP was initiated under a DreamWear FFM in small size, beginning at 5 cmH20 with heated humidity per AASM split night standards and pressure was advanced to 7 cmH20 because of hypopneas, apneas and desaturations. At a PAP pressure of 7 cmH20, there was a reduction of the AHI to 0 with improvement of sleep apnea. The patient became very restless after 2 AM and  did not achieve a high sleep time.  There was nocturia times 3.   Lights Out was at 22:20 and Lights On at 05:00. Total recording time (TRT) was 400.5 minutes, with a total sleep time (TST) of 216 minutes. The patient's sleep latency was 30 minutes. REM latency was 170 minutes.  The sleep efficiency was 53.9 %.    SLEEP ARCHITECTURE: WASO (Wake after sleep onset) was 174.5 minutes.  There were 26.5 minutes in Stage N1, 95 minutes Stage N2, 73 minutes Stage N3 and 21.5 minutes in Stage REM.  The percentage of Stage N1 was 12.3%, Stage N2 was 44.%, Stage N3 was 33.8% and Stage R (REM sleep) was 10.%. The sleep architecture was notable for prolonged period of wakefulness form 1.40 AM through 3.50 AM.     RESPIRATORY ANALYSIS:  There was a total of 8 respiratory events: 8 hypopneas with a hypopnea index of 2.2/hour.  The total APNEA/HYPOPNEA INDEX (AHI) was 2.2 /hour.  8 events occurred in REM sleep and 0 events in NREM. The REM AHI was 22.3 /hour versus a non-REM AHI of 0 /hour.   The patient spent 70.5 minutes of total sleep time in the supine position and 146 minutes in non-supine. The supine AHI was 6.8, versus a non-supine AHI of 0.0.  OXYGEN SATURATION & C02:  The baseline 02 saturation was 96%, with the lowest being 89%. Time spent below 89% saturation equaled 0 minutes. The arousals were noted as:  48 were spontaneous, 0 were associated with PLMs, 0 were associated with respiratory events.  Audio and video analysis did not show any abnormal or unusual movements, behaviors, phonations or vocalizations.  The patient took 3 bathroom breaks. EKG with many PVCs. Post-study, the patient indicated that sleep was actually better than expected.    DIAGNOSIS : Obstructive Sleep Apnea responded well to 7 cm water CPAP. The patient was fitted with a Respironics Dreamwear FFM small mask. Overall sleep efficiency remained low.  PLANS/RECOMMENDATIONS: auto CPAP with a setting from 5-9 cm water with 1 cm  EPR. 1. Any apnea patient should avoid sedatives, hypnotics, and alcohol consumption before bedtime. 2. Consider underlying anxiety as cause for the frequent spontaneous arousals and refer to cognitive behavioral therapy if indicated. 3. Continue with medical weight loss program.  4. CPAP therapy compliance is defined as 4 hours or more of nightly use.  DISCUSSION: A follow up appointment will be scheduled in the Sleep Clinic at [facility].   Please call [phone] with any questions.   Please schedule with NP. The patient needs to be seen face to face for best possible communication.   I certify that I have reviewed the entire raw data recording prior to the issuance of this report in accordance with the Standards of Accreditation of the American Academy of Sleep Medicine (AASM)   Larey Seat, M.D. Diplomat, Tax adviser of Psychiatry and Neurology  Diplomat, Tax adviser of Sleep Medicine Market researcher, [employer] [facility] at [employer]

## 2020-09-12 NOTE — Addendum Note (Signed)
Addended by: Larey Seat on: 09/12/2020 05:37 PM   Modules accepted: Orders

## 2020-09-13 ENCOUNTER — Telehealth: Payer: Self-pay | Admitting: Neurology

## 2020-09-13 NOTE — Telephone Encounter (Signed)
Called the patient through the phone number provided with a sign language interpretor.  I advised pt that Dr. Brett Fairy reviewed their sleep study results and found that pt was best treated with CPAP at a pressure of 7 cm water pressure. Dr. Brett Fairy recommends that pt starts a auto CPAP 5-9 cm water pressure. I reviewed PAP compliance expectations with the pt. Pt is agreeable to starting a CPAP. I advised pt that an order will be sent to a DME, [facility], and [facility] will call the pt within about one week after they file with the pt's insurance. [facility] will show the pt how to use the machine, fit for masks, and troubleshoot the CPAP if needed. A follow up appt will need to be made 31-90 days after picking up her machine A letter with all of this information in it will be mailed to the pt as a reminder. I verified with the pt that the address we have on file is correct. Pt verbalized understanding of results. Pt had no questions at this time but was encouraged to call back if questions arise. I have sent the order to [facility] and have received confirmation that they have received the order.

## 2020-09-13 NOTE — Telephone Encounter (Signed)
-----   Message from Larey Seat, MD sent at 09/12/2020  5:37 PM EST ----- The patient took 3 bathroom breaks. EKG with many PVCs. Post-study, the patient indicated that sleep was actually better than expected.    DIAGNOSIS : Obstructive Sleep Apnea responded well to 7 cm water CPAP. The patient was fitted with a Respironics Dreamwear FFM small mask. Overall sleep efficiency remained low.  PLANS/RECOMMENDATIONS: auto CPAP with a setting from 5-9 cm water with 1 cm EPR. 1. Any apnea patient should avoid sedatives, hypnotics, and alcohol consumption before bedtime. 2. Consider underlying anxiety as cause for the frequent spontaneous arousals and refer to cognitive behavioral therapy if indicated. 3. Continue with medical weight loss program.  4. CPAP therapy compliance is defined as 4 hours or more of nightly use.  DISCUSSION: A follow up appointment will be scheduled in the Sleep Clinic at [facility].   Please call [phone] with any questions.   Please schedule with NP. The patient needs to be seen face to face for best possible communication.

## 2020-09-14 ENCOUNTER — Other Ambulatory Visit: Payer: Self-pay | Admitting: Family Medicine

## 2020-09-14 DIAGNOSIS — G2581 Restless legs syndrome: Secondary | ICD-10-CM

## 2020-09-27 ENCOUNTER — Other Ambulatory Visit: Payer: Self-pay

## 2020-09-27 ENCOUNTER — Encounter (HOSPITAL_BASED_OUTPATIENT_CLINIC_OR_DEPARTMENT_OTHER): Payer: Self-pay

## 2020-09-27 ENCOUNTER — Ambulatory Visit (HOSPITAL_BASED_OUTPATIENT_CLINIC_OR_DEPARTMENT_OTHER)
Admission: RE | Admit: 2020-09-27 | Discharge: 2020-09-27 | Disposition: A | Discharge status: Home / Self Care | Payer: Federal, State, Local not specified - PPO | Source: Ambulatory Visit | Attending: Family Medicine | Admitting: Family Medicine

## 2020-09-27 DIAGNOSIS — Z1231 Encounter for screening mammogram for malignant neoplasm of breast: Secondary | ICD-10-CM | POA: Diagnosis not present

## 2020-09-28 ENCOUNTER — Ambulatory Visit (HOSPITAL_BASED_OUTPATIENT_CLINIC_OR_DEPARTMENT_OTHER): Payer: Federal, State, Local not specified - PPO

## 2020-10-12 ENCOUNTER — Ambulatory Visit (INDEPENDENT_AMBULATORY_CARE_PROVIDER_SITE_OTHER): Payer: Federal, State, Local not specified - PPO | Admitting: Family Medicine

## 2020-10-12 ENCOUNTER — Other Ambulatory Visit: Payer: Self-pay

## 2020-10-12 ENCOUNTER — Encounter: Payer: Self-pay | Admitting: Family Medicine

## 2020-10-12 VITALS — BP 110/80 | HR 82 | Temp 98.0°F | Resp 18 | Ht 64.0 in | Wt 238.4 lb

## 2020-10-12 DIAGNOSIS — M79672 Pain in left foot: Secondary | ICD-10-CM

## 2020-10-12 DIAGNOSIS — M79671 Pain in right foot: Secondary | ICD-10-CM | POA: Diagnosis not present

## 2020-10-12 MED ORDER — DICLOFENAC SODIUM 75 MG PO TBEC: 75.0000 mg | DELAYED_RELEASE_TABLET | Freq: Two times a day (BID) | ORAL | 3 refills | Status: DC | PRN

## 2020-10-12 NOTE — Patient Instructions (Signed)

## 2020-10-12 NOTE — Progress Notes (Signed)
Patient ID: Rhonda Morrow, female    DOB: 25-Jul-1961  Age: 59 y.o. MRN: [id number]    Subjective:  Subjective  HPI [name] presents for R foot pain on top of foot x 1 month and achilles tendon L foot occurred second ---- no known injury  Pain in a throbbing that is constant  Review of Systems  Constitutional: Negative for appetite change, diaphoresis, fatigue and unexpected weight change.  Eyes: Negative for pain, redness and visual disturbance.  Respiratory: Negative for cough, chest tightness, shortness of breath and wheezing.   Cardiovascular: Negative for chest pain, palpitations and leg swelling.  Endocrine: Negative for cold intolerance, heat intolerance, polydipsia, polyphagia and polyuria.  Genitourinary: Negative for difficulty urinating, dysuria and frequency.  Musculoskeletal: Positive for arthralgias and gait problem.  Neurological: Negative for dizziness, light-headedness, numbness and headaches.    History Past Medical History:  Diagnosis Date  . Arthritis   . Asthma   . Complication of anesthesia    one time woke up and was vey scared,17 yrs ago  . Deaf   . Diabetes mellitus without complication ([place])   . GERD (gastroesophageal reflux disease)   . Hypertension   . Left groin pain   . Thyroid disease     She has a past surgical history that includes Cesarean section; Cardiac catheterization; Esophageal manometry (N/A, 09/29/2013); Cholecystectomy (N/A, 06/20/2016); and Total hip arthroplasty (Left, 02/21/2017).   Her family history includes Diabetes in her father and mother; Heart disease in her mother.She reports that she has never smoked. She has never used smokeless tobacco. She reports that she does not drink alcohol and does not use drugs.  Current Outpatient Medications on File Prior to Visit  Medication Sig Dispense Refill  . allopurinol (ZYLOPRIM) 100 MG tablet Take 1 tablet (100 mg total) by mouth daily. 90 tablet 3  . amLODipine (NORVASC) 5 MG  tablet Take 1 tablet (5 mg total) by mouth daily. 90 tablet 1  . aspirin EC 325 MG tablet Take 1 tablet (325 mg total) by mouth 2 (two) times daily. 84 tablet 0  . azelastine (ASTELIN) 0.1 % nasal spray Place 2 sprays into both nostrils 2 (two) times daily. Use in each nostril as directed 30 mL 3  . benzonatate (TESSALON) 200 MG capsule Take 1 capsule (200 mg total) by mouth 3 (three) times daily as needed for cough. 30 capsule 1  . Calcium Carbonate-Vit D-Min (CALCIUM 1200) 1200-1000 MG-UNIT CHEW 1 po qd 30 tablet 11  . cetirizine (ZYRTEC) 10 MG tablet TAKE 1 TABLET (10 MG TOTAL) BY MOUTH DAILY. 90 tablet 1  . colchicine 0.6 MG tablet Take 1 tablet (0.6 mg total) by mouth daily. 90 tablet 1  . Cyanocobalamin (B-12) 1000 MCG SUBL Dissolove 1 tablet under tongue once a day 90 each 1  . cyclobenzaprine (FLEXERIL) 10 MG tablet TAKE 1 TABLET BY MOUTH THREE TIMES A DAY AS NEEDED FOR MUSCLE SPASMS 30 tablet 2  . diclofenac sodium (VOLTAREN) 1 % GEL Apply 4 g topically 4 (four) times daily as needed. 500 g 6  . escitalopram (LEXAPRO) 10 MG tablet TAKE 1 TABLET BY MOUTH EVERYDAY AT BEDTIME 90 tablet 1  . Fiber CHEW Chew 1 tablet by mouth daily.    . fluticasone (FLONASE) 50 MCG/ACT nasal spray Place 2 sprays into both nostrils daily. 16 g 6  . fluticasone (FLONASE) 50 MCG/ACT nasal spray SPRAY 2 SPRAYS INTO EACH NOSTRIL EVERY DAY 48 mL 1  . glucose blood  test strip Use as instructed--- one touch verio test strips 100 each 12  . hydrochlorothiazide (HYDRODIURIL) 12.5 MG tablet Take 1 tablet (12.5 mg total) by mouth daily. 90 tablet 1  . hydrOXYzine (ATARAX/VISTARIL) 10 MG tablet TAKE 1 TO 2 TABLETS BY MOUTH AT BEDTIME AS NEEDED FOR ITCHING 180 tablet 1  . ipratropium-albuterol (DUONEB) 0.5-2.5 (3) MG/3ML SOLN Take 3 mLs by nebulization every 6 (six) hours as needed. 360 mL 3  . Lancets (ONETOUCH ULTRASOFT) lancets Use as instructed 100 each 12  . meclizine (ANTIVERT) 12.5 MG tablet Take 1 tablet (12.5 mg  total) by mouth 3 (three) times daily as needed for dizziness. 30 tablet 0  . metaxalone (SKELAXIN) 800 MG tablet TAKE 1 TABLET BY MOUTH THREE TIMES A DAY (Patient taking differently: Take 800 mg by mouth 3 (three) times daily. ) 30 tablet 2  . metFORMIN (GLUCOPHAGE-XR) 500 MG 24 hr tablet Take 1 tablet (500 mg total) by mouth daily with breakfast. 90 tablet 1  . mometasone-formoterol (DULERA) 100-5 MCG/ACT AERO Inhale 2 puffs into the lungs 2 (two) times daily. 1 Inhaler PRN  . montelukast (SINGULAIR) 10 MG tablet Take 1 tablet (10 mg total) by mouth at bedtime. 90 tablet 3  . Multiple Vitamin (MULTIVITAMIN) tablet Take 1 tablet by mouth daily.    . NONFORMULARY OR COMPOUNDED ITEM Nebulizer   DX ASTHMA 1 each 0  . nystatin-triamcinolone (MYCOLOG II) cream Apply twice daily to lower chest area/upper abdomen 30 g 0  . ofloxacin (FLOXIN OTIC) 0.3 % OTIC solution Place 10 drops into the right ear daily. 10 mL 0  . omeprazole (PRILOSEC) 20 MG capsule Take 1 capsule (20 mg total) by mouth daily. 90 capsule 3  . ondansetron (ZOFRAN ODT) 4 MG disintegrating tablet Take 1 tablet (4 mg total) by mouth every 8 (eight) hours as needed for nausea or vomiting. 20 tablet 0  . potassium chloride SA (KLOR-CON M20) 20 MEQ tablet Take 1 tablet (20 mEq total) by mouth daily. 90 tablet 1  . rOPINIRole (REQUIP) 0.25 MG tablet TAKE 1 TABLET BY MOUTH AT BEDTIME FOR 4 DAYS THEN INCREASE TO 2 TABLETS AT BEDTIME IF NEEDED 60 tablet 5  . Spacer/Aero-Holding Chambers (AEROCHAMBER MV) inhaler Use as instructed 1 each 0  . SYMBICORT 160-4.5 MCG/ACT inhaler Inhale 2 puffs into the lungs every 12 (twelve) hours. 1 each 3  . traMADol (ULTRAM) 50 MG tablet TAKE 1 TABLET (50 MG TOTAL) BY MOUTH EVERY 8 (EIGHT) HOURS AS NEEDED FOR UP TO 5 DAYS. 16 tablet 0  . triamcinolone cream (KENALOG) 0.1 % Apply bid to hands 30 g 0  . VENTOLIN HFA 108 (90 Base) MCG/ACT inhaler Inhale 2 puffs into the lungs every 6 (six) hours as needed for  wheezing or shortness of breath. 18 g 5   No current facility-administered medications on file prior to visit.     Objective:  Objective  Physical Exam Vitals and nursing note reviewed.  Constitutional:      Appearance: She is well-developed.  HENT:     Head: Normocephalic and atraumatic.  Eyes:     Conjunctiva/sclera: Conjunctivae normal.  Neck:     Thyroid: No thyromegaly.     Vascular: No carotid bruit or JVD.  Cardiovascular:     Rate and Rhythm: Normal rate and regular rhythm.     Heart sounds: Normal heart sounds. No murmur heard.   Pulmonary:     Effort: Pulmonary effort is normal. No respiratory distress.  Breath sounds: Normal breath sounds. No wheezing or rales.  Chest:     Chest wall: No tenderness.  Musculoskeletal:        General: Tenderness and signs of injury present.     Cervical back: Normal range of motion and neck supple.     Right lower leg: Tenderness and bony tenderness present. No edema.     Left lower leg: Tenderness and bony tenderness present. No edema.       Legs:  Neurological:     Mental Status: She is alert and oriented to person, place, and time.    BP 110/80 (BP Location: Left Arm, Patient Position: Sitting, Cuff Size: Large)   Pulse 82   Temp 98 F (36.7 C) (Oral)   Resp 18   Ht 5\' 4"  (1.626 m)   Wt 238 lb 6.4 oz (108.1 kg)   LMP 05/20/2012   SpO2 98%   BMI 40.92 kg/m  Wt Readings from Last 3 Encounters:  10/12/20 238 lb 6.4 oz (108.1 kg)  07/22/20 235 lb (106.6 kg)  06/21/20 231 lb (104.8 kg)     Lab Results  Component Value Date   WBC 5.2 06/01/2020   HGB 13.2 06/01/2020   HCT 40.5 06/01/2020   PLT 168.0 06/01/2020   GLUCOSE 112 (H) 07/22/2020   CHOL 165 07/22/2020   TRIG 80 07/22/2020   HDL 57 07/22/2020   LDLCALC 91 07/22/2020   ALT 14 07/22/2020   AST 16 07/22/2020   NA 139 07/22/2020   K 3.7 07/22/2020   CL 101 07/22/2020   CREATININE 0.76 07/22/2020   BUN 12 07/22/2020   CO2 28 07/22/2020   TSH 0.74  04/20/2020   INR 1.05 02/12/2017   HGBA1C 6.8 (H) 07/22/2020   MICROALBUR 0.5 07/22/2020    MM 3D SCREEN BREAST BILATERAL  Result Date: 09/30/2020 CLINICAL DATA:  Screening. EXAM: DIGITAL SCREENING BILATERAL MAMMOGRAM WITH TOMO AND CAD COMPARISON:  Previous exam(s). ACR Breast Density Category a: The breast tissue is almost entirely fatty. FINDINGS: There are no findings suspicious for malignancy. Images were processed with CAD. IMPRESSION: No mammographic evidence of malignancy. A result letter of this screening mammogram will be mailed directly to the patient. RECOMMENDATION: Screening mammogram in one year. (Code:SM-B-01Y) BI-RADS CATEGORY  1: Negative. Electronically Signed   By: Lajean Manes M.D.   On: 09/30/2020 09:12     Assessment & Plan:  Plan  I have discontinued Verdis Frederickson A. Chagoya's cefdinir and clarithromycin. I am also having her maintain her multivitamin, Fiber, NONFORMULARY OR COMPOUNDED ITEM, azelastine, aspirin EC, metaxalone, meclizine, diclofenac sodium, mometasone-formoterol, montelukast, ipratropium-albuterol, AeroChamber MV, B-12, triamcinolone, nystatin-triamcinolone, colchicine, traMADol, fluticasone, cetirizine, amLODipine, hydrochlorothiazide, potassium chloride SA, metFORMIN, onetouch ultrasoft, glucose blood, Calcium 1200, Ventolin HFA, ofloxacin, allopurinol, ondansetron, fluticasone, benzonatate, Symbicort, cyclobenzaprine, escitalopram, omeprazole, hydrOXYzine, rOPINIRole, and diclofenac.  Meds ordered this encounter  Medications  . diclofenac (VOLTAREN) 75 MG EC tablet    Sig: Take 1 tablet (75 mg total) by mouth 2 (two) times daily as needed.    Dispense:  60 tablet    Refill:  3    Problem List Items Addressed This Visit    None    Visit Diagnoses    Foot pain, bilateral    -  Primary   Relevant Medications   diclofenac (VOLTAREN) 75 MG EC tablet   Other Relevant Orders   Ambulatory referral to Podiatry    pt may also use voltaren gel--- she had both  gel and tab for  previous injury from ortho  Follow-up: Return if symptoms worsen or fail to improve.  Ann Held, DO

## 2020-10-15 ENCOUNTER — Encounter: Payer: Self-pay | Admitting: Podiatry

## 2020-10-15 ENCOUNTER — Other Ambulatory Visit: Payer: Self-pay | Admitting: Family Medicine

## 2020-10-15 ENCOUNTER — Ambulatory Visit: Payer: Federal, State, Local not specified - PPO | Admitting: Podiatry

## 2020-10-15 ENCOUNTER — Other Ambulatory Visit: Payer: Self-pay

## 2020-10-15 ENCOUNTER — Ambulatory Visit (INDEPENDENT_AMBULATORY_CARE_PROVIDER_SITE_OTHER): Payer: Federal, State, Local not specified - PPO

## 2020-10-15 DIAGNOSIS — I10 Essential (primary) hypertension: Secondary | ICD-10-CM

## 2020-10-15 DIAGNOSIS — M79671 Pain in right foot: Secondary | ICD-10-CM

## 2020-10-15 DIAGNOSIS — S9030XA Contusion of unspecified foot, initial encounter: Secondary | ICD-10-CM

## 2020-10-15 DIAGNOSIS — M79672 Pain in left foot: Secondary | ICD-10-CM

## 2020-10-15 DIAGNOSIS — M7662 Achilles tendinitis, left leg: Secondary | ICD-10-CM

## 2020-10-15 DIAGNOSIS — M778 Other enthesopathies, not elsewhere classified: Secondary | ICD-10-CM | POA: Diagnosis not present

## 2020-10-15 NOTE — Patient Instructions (Signed)

## 2020-10-16 DIAGNOSIS — M7662 Achilles tendinitis, left leg: Secondary | ICD-10-CM | POA: Diagnosis not present

## 2020-10-16 DIAGNOSIS — S9030XA Contusion of unspecified foot, initial encounter: Secondary | ICD-10-CM | POA: Diagnosis not present

## 2020-10-16 MED ORDER — TRIAMCINOLONE ACETONIDE 10 MG/ML IJ SUSP
10.0000 mg | Freq: Once | INTRAMUSCULAR | Status: AC
  Administered 2020-10-16: 10 mg

## 2020-10-16 NOTE — Progress Notes (Signed)
Subjective:   Patient ID: Rhonda Morrow, female   DOB: 59 y.o.   MRN: [id number]   HPI Patient presents who is deaf and has interpreter with her today with a lot of pain in the back of the left heel and on top of the right foot.  Patient states that it is hard for her to do her work and the left especially has been bothering her.  Patient does not smoke likes to be active if possible   Review of Systems  All other systems reviewed and are negative.       Objective:  Physical Exam Vitals and nursing note reviewed.  Constitutional:      Appearance: She is well-developed and well-nourished.  Cardiovascular:     Pulses: Intact distal pulses.  Pulmonary:     Effort: Pulmonary effort is normal.  Musculoskeletal:        General: Normal range of motion.  Skin:    General: Skin is warm.  Neurological:     Mental Status: She is alert.     Neurovascular status intact muscle strength found to be adequate range of motion adequate.  Patient does have exquisite discomfort posterior medial aspect of the left heel insertion calcaneus with no indications of muscle dysfunction or weakness and no equinus condition.  Vague pain on top of the right foot with no specific area that it is seen in good digital perfusion well oriented x3     Assessment:  Acute Achilles tendinitis left medial side along with probable dorsal tendinitis right which may be compensatory     Plan:  H&P reviewed conditions and discussed injection left explaining chances for rupture.  Patient wants to follow this course and I discussed injection explaining risk and at this time patient wants the injection to be done and I did sterile prep injected the medial side Achilles insertion 3 mg dexamethasone Kenalog 5 mg Xylocaine and applied air fracture walker to immobilize and let the area rest.  Patient will be seen back to recheck and will initiate ice and stretching exercises  X-rays indicated small posterior spur left no  indication stress fracture or problems with the right forefoot

## 2020-10-19 ENCOUNTER — Ambulatory Visit (INDEPENDENT_AMBULATORY_CARE_PROVIDER_SITE_OTHER): Payer: Federal, State, Local not specified - PPO

## 2020-10-19 ENCOUNTER — Other Ambulatory Visit: Payer: Self-pay

## 2020-10-19 DIAGNOSIS — Z23 Encounter for immunization: Secondary | ICD-10-CM

## 2020-10-19 NOTE — Progress Notes (Signed)
Patient here today for second shingrix vaccine. 0.79mL given in left deltoid IM. Patient tolerated well. [facility] updated.

## 2020-11-15 ENCOUNTER — Other Ambulatory Visit: Payer: Self-pay

## 2020-11-15 ENCOUNTER — Ambulatory Visit (INDEPENDENT_AMBULATORY_CARE_PROVIDER_SITE_OTHER): Payer: Federal, State, Local not specified - PPO | Admitting: Pulmonary Disease

## 2020-11-15 ENCOUNTER — Encounter: Payer: Self-pay | Admitting: Pulmonary Disease

## 2020-11-15 VITALS — BP 124/70 | HR 87 | Temp 97.4°F | Ht 64.0 in | Wt 238.0 lb

## 2020-11-15 DIAGNOSIS — Z6841 Body Mass Index (BMI) 40.0 and over, adult: Secondary | ICD-10-CM

## 2020-11-15 DIAGNOSIS — J4520 Mild intermittent asthma, uncomplicated: Secondary | ICD-10-CM

## 2020-11-15 DIAGNOSIS — R0789 Other chest pain: Secondary | ICD-10-CM

## 2020-11-15 DIAGNOSIS — R0602 Shortness of breath: Secondary | ICD-10-CM | POA: Diagnosis not present

## 2020-11-15 MED ORDER — VENTOLIN HFA 108 (90 BASE) MCG/ACT IN AERS: 2.0000 | INHALATION_SPRAY | Freq: Four times a day (QID) | RESPIRATORY_TRACT | 5 refills | Status: DC | PRN

## 2020-11-15 MED ORDER — SYMBICORT 160-4.5 MCG/ACT IN AERO: 2.0000 | INHALATION_SPRAY | Freq: Two times a day (BID) | RESPIRATORY_TRACT | 3 refills | Status: DC

## 2020-11-15 NOTE — Progress Notes (Signed)
Synopsis: Referred in January 2020 as a self-referral for shortness of breath patient's PCP [id number] Koren Shiver, *  Subjective:   PATIENT ID: Rhonda Morrow GENDER: female DOB: 03/02/1961, MRN: [id number]  Chief Complaint  Patient presents with  . Follow-up    Breathing has been ok, but she does want her lungs checked today.  Discuss a new cpap machine.     This is a 60 year old that was recently seen in the emergency room for shortness of breath as well as vomiting.  She was diagnosed with a flulike illness and otherwise has been doing well since then.  She has a diagnosis of asthma since childhood.  She currently works at the [employer].  She has noticed increased shortness of breath with exertion.  She has been using her albuterol nebulizer as needed.  She does state that her husband put 3 vials of albuterol in her nebulizer which made her very tremulous.  She has been using Dulera but finds the inhaler expensive.  She does think that her symptoms from her asthma are okay controlled but have flared up since her recent illness.  She is slowly getting better.  Her shortness of breath only present with significant exertion.  Patient denies chest pain, nausea vomiting, diarrhea, hemoptysis.  OV 12/17/2018: Was at therapy yesterday for her should and developed SOB. She difficulty breathing and noticed chest tightness. Ended up going home and later last night she as having more SOB and chest tightness.  Further investigation of the symptoms appear to have only started when she was walking briskly on a treadmill.  She started to feel significantly short of breath and inability to catch her breath.  She had to stop this exercise.  She sat rested for a few minutes and then decided to attempt lifting weights.  Lifting weights she also experienced dyspnea.  Patient only denies chest tightness and the ability to catch her breath.  Of note she did not take any of her inhaler medications prior to her  attempt at exercising.  Once she took her inhalers when she got home she felt much better and her symptoms dissipated she also felt better with the use of her nebulizer in the middle of the night because she was having some trouble sleeping.  She also did not take her inhaler medications this morning prior to coming to the office today.  Does not appear to be that she has been using them on a regular basis and she has only been using them "as needed".  OV 11/15/2020: Patient last seen in our office on 04/01/2020, Wyn Quaker, NP.  At the time using albuterol for her asthma.  Not using Symbicort.  Was counseled on resuming Symbicort 160.  Here today for asthma follow-up. Patient tries to be compliant with her Symbicort. Sometimes she forgets her evening dose. She does remember to take it in the morning. She is rarely using her albuterol inhaler. But she brings it with her in case of emergencies. No recent exacerbations. Working full-time at the [employer]. She said it has been very hectic recently at work.   Past Medical History:  Diagnosis Date  . Arthritis   . Asthma   . Complication of anesthesia    one time woke up and was vey scared,17 yrs ago  . Deaf   . Diabetes mellitus without complication ([place])   . GERD (gastroesophageal reflux disease)   . Hypertension   . Left groin pain   .  Thyroid disease      Family History  Problem Relation Age of Onset  . Diabetes Mother   . Heart disease Mother   . Diabetes Father      Past Surgical History:  Procedure Laterality Date  . CARDIAC CATHETERIZATION    . CESAREAN SECTION    . CHOLECYSTECTOMY N/A 06/20/2016   Procedure: LAPAROSCOPIC CHOLECYSTECTOMY;  Surgeon: Rolm Bookbinder, MD;  Location: [facility];  Service: General;  Laterality: N/A;  . ESOPHAGEAL MANOMETRY N/A 09/29/2013   Procedure: ESOPHAGEAL MANOMETRY (EM);  Surgeon: Milus Banister, MD;  Location: [facility];  Service: Endoscopy;  Laterality: N/A;  . TOTAL HIP ARTHROPLASTY Left  02/21/2017   Procedure: LEFT TOTAL HIP ARTHROPLASTY ANTERIOR APPROACH;  Surgeon: Leandrew Koyanagi, MD;  Location: [facility];  Service: Orthopedics;  Laterality: Left;    Social History   Socioeconomic History  . Marital status: Married    Spouse name: Not on file  . Number of children: Not on file  . Years of education: Not on file  . Highest education level: Not on file  Occupational History  . Occupation: disabled  Tobacco Use  . Smoking status: Never Smoker  . Smokeless tobacco: Never Used  Vaping Use  . Vaping Use: Never used  Substance and Sexual Activity  . Alcohol use: No    Alcohol/week: 0.0 standard drinks  . Drug use: No  . Sexual activity: Never  Other Topics Concern  . Not on file  Social History Narrative   Lives with family.  Has 3 children.  Works for [employer].  Education: high school.   Social Determinants of Health   Financial Resource Strain: Not on file  Food Insecurity: Not on file  Transportation Needs: Not on file  Physical Activity: Not on file  Stress: Not on file  Social Connections: Not on file  Intimate Partner Violence: Not on file     Allergies  Allergen Reactions  . Losartan Shortness Of Breath  . Oxycodone-Acetaminophen Nausea And Vomiting  . Augmentin [Amoxicillin-Pot Clavulanate] Diarrhea     Outpatient Medications Prior to Visit  Medication Sig Dispense Refill  . allopurinol (ZYLOPRIM) 100 MG tablet Take 1 tablet (100 mg total) by mouth daily. 90 tablet 3  . amLODipine (NORVASC) 5 MG tablet TAKE 1 TABLET BY MOUTH EVERY DAY 90 tablet 1  . aspirin EC 325 MG tablet Take 1 tablet (325 mg total) by mouth 2 (two) times daily. 84 tablet 0  . azelastine (ASTELIN) 0.1 % nasal spray Place 2 sprays into both nostrils 2 (two) times daily. Use in each nostril as directed 30 mL 3  . benzonatate (TESSALON) 200 MG capsule Take 1 capsule (200 mg total) by mouth 3 (three) times daily as needed for cough. 30 capsule 1  . Calcium Carbonate-Vit D-Min (CALCIUM  1200) 1200-1000 MG-UNIT CHEW 1 po qd 30 tablet 11  . cetirizine (ZYRTEC) 10 MG tablet TAKE 1 TABLET (10 MG TOTAL) BY MOUTH DAILY. 90 tablet 1  . colchicine 0.6 MG tablet Take 1 tablet (0.6 mg total) by mouth daily. 90 tablet 1  . Cyanocobalamin (B-12) 1000 MCG SUBL Dissolove 1 tablet under tongue once a day 90 each 1  . cyclobenzaprine (FLEXERIL) 10 MG tablet TAKE 1 TABLET BY MOUTH THREE TIMES A DAY AS NEEDED FOR MUSCLE SPASMS 30 tablet 2  . diclofenac (VOLTAREN) 75 MG EC tablet Take 1 tablet (75 mg total) by mouth 2 (two) times daily as needed. 60 tablet 3  . diclofenac  sodium (VOLTAREN) 1 % GEL Apply 4 g topically 4 (four) times daily as needed. 500 g 6  . escitalopram (LEXAPRO) 10 MG tablet TAKE 1 TABLET BY MOUTH EVERYDAY AT BEDTIME 90 tablet 1  . Fiber CHEW Chew 1 tablet by mouth daily.    . fluticasone (FLONASE) 50 MCG/ACT nasal spray Place 2 sprays into both nostrils daily. 16 g 6  . fluticasone (FLONASE) 50 MCG/ACT nasal spray SPRAY 2 SPRAYS INTO EACH NOSTRIL EVERY DAY 48 mL 1  . glucose blood test strip Use as instructed--- one touch verio test strips 100 each 12  . hydrochlorothiazide (HYDRODIURIL) 12.5 MG tablet Take 1 tablet (12.5 mg total) by mouth daily. 90 tablet 1  . hydrOXYzine (ATARAX/VISTARIL) 10 MG tablet TAKE 1 TO 2 TABLETS BY MOUTH AT BEDTIME AS NEEDED FOR ITCHING 180 tablet 1  . ipratropium-albuterol (DUONEB) 0.5-2.5 (3) MG/3ML SOLN Take 3 mLs by nebulization every 6 (six) hours as needed. 360 mL 3  . Lancets (ONETOUCH ULTRASOFT) lancets Use as instructed 100 each 12  . meclizine (ANTIVERT) 12.5 MG tablet Take 1 tablet (12.5 mg total) by mouth 3 (three) times daily as needed for dizziness. 30 tablet 0  . metaxalone (SKELAXIN) 800 MG tablet TAKE 1 TABLET BY MOUTH THREE TIMES A DAY (Patient taking differently: Take 800 mg by mouth 3 (three) times daily.) 30 tablet 2  . metFORMIN (GLUCOPHAGE-XR) 500 MG 24 hr tablet Take 1 tablet (500 mg total) by mouth daily with breakfast. 90  tablet 1  . montelukast (SINGULAIR) 10 MG tablet Take 1 tablet (10 mg total) by mouth at bedtime. 90 tablet 3  . Multiple Vitamin (MULTIVITAMIN) tablet Take 1 tablet by mouth daily.    . NONFORMULARY OR COMPOUNDED ITEM Nebulizer   DX ASTHMA 1 each 0  . nystatin-triamcinolone (MYCOLOG II) cream Apply twice daily to lower chest area/upper abdomen 30 g 0  . ofloxacin (FLOXIN OTIC) 0.3 % OTIC solution Place 10 drops into the right ear daily. 10 mL 0  . omeprazole (PRILOSEC) 20 MG capsule Take 1 capsule (20 mg total) by mouth daily. 90 capsule 3  . ondansetron (ZOFRAN ODT) 4 MG disintegrating tablet Take 1 tablet (4 mg total) by mouth every 8 (eight) hours as needed for nausea or vomiting. 20 tablet 0  . potassium chloride SA (KLOR-CON M20) 20 MEQ tablet Take 1 tablet (20 mEq total) by mouth daily. 90 tablet 1  . rOPINIRole (REQUIP) 0.25 MG tablet TAKE 1 TABLET BY MOUTH AT BEDTIME FOR 4 DAYS THEN INCREASE TO 2 TABLETS AT BEDTIME IF NEEDED 60 tablet 5  . Spacer/Aero-Holding Chambers (AEROCHAMBER MV) inhaler Use as instructed 1 each 0  . traMADol (ULTRAM) 50 MG tablet TAKE 1 TABLET (50 MG TOTAL) BY MOUTH EVERY 8 (EIGHT) HOURS AS NEEDED FOR UP TO 5 DAYS. 16 tablet 0  . triamcinolone cream (KENALOG) 0.1 % Apply bid to hands 30 g 0  . mometasone-formoterol (DULERA) 100-5 MCG/ACT AERO Inhale 2 puffs into the lungs 2 (two) times daily. 1 Inhaler PRN  . SYMBICORT 160-4.5 MCG/ACT inhaler Inhale 2 puffs into the lungs every 12 (twelve) hours. 1 each 3  . VENTOLIN HFA 108 (90 Base) MCG/ACT inhaler Inhale 2 puffs into the lungs every 6 (six) hours as needed for wheezing or shortness of breath. 18 g 5   No facility-administered medications prior to visit.    Review of Systems  Constitutional: Negative for chills, fever, malaise/fatigue and weight loss.  HENT: Negative for hearing loss, sore  throat and tinnitus.   Eyes: Negative for blurred vision and double vision.  Respiratory: Positive for shortness of  breath and wheezing. Negative for cough, hemoptysis, sputum production and stridor.   Cardiovascular: Negative for chest pain, palpitations, orthopnea, leg swelling and PND.  Gastrointestinal: Negative for abdominal pain, constipation, diarrhea, heartburn, nausea and vomiting.  Genitourinary: Negative for dysuria, hematuria and urgency.  Musculoskeletal: Negative for joint pain and myalgias.  Skin: Negative for itching and rash.  Neurological: Negative for dizziness, tingling, weakness and headaches.  Endo/Heme/Allergies: Negative for environmental allergies. Does not bruise/bleed easily.  Psychiatric/Behavioral: Negative for depression. The patient is not nervous/anxious and does not have insomnia.   All other systems reviewed and are negative.    Objective:  Physical Exam Vitals reviewed.  Constitutional:      General: She is not in acute distress.    Appearance: She is well-developed and well-nourished. She is obese.  HENT:     Head: Normocephalic and atraumatic.     Mouth/Throat:     Mouth: Oropharynx is clear and moist.  Eyes:     General: No scleral icterus.    Conjunctiva/sclera: Conjunctivae normal.     Pupils: Pupils are equal, round, and reactive to light.  Neck:     Vascular: No JVD.     Trachea: No tracheal deviation.  Cardiovascular:     Rate and Rhythm: Normal rate and regular rhythm.     Pulses: Intact distal pulses.     Heart sounds: Normal heart sounds. No murmur heard.   Pulmonary:     Effort: Pulmonary effort is normal. No tachypnea, accessory muscle usage or respiratory distress.     Breath sounds: Normal breath sounds. No stridor. No wheezing, rhonchi or rales.  Abdominal:     General: Bowel sounds are normal. There is no distension.     Palpations: Abdomen is soft.     Tenderness: There is no abdominal tenderness.  Musculoskeletal:        General: No tenderness or edema.     Cervical back: Neck supple.  Lymphadenopathy:     Cervical: No cervical  adenopathy.  Skin:    General: Skin is warm and dry.     Capillary Refill: Capillary refill takes less than 2 seconds.     Findings: No rash.  Neurological:     Mental Status: She is alert and oriented to person, place, and time.  Psychiatric:        Mood and Affect: Mood and affect normal.        Behavior: Behavior normal.      Vitals:   11/15/20 1205  BP: 124/70  Pulse: 87  Temp: (!) 97.4 F (36.3 C)  TempSrc: Tympanic  SpO2: 98%  Weight: 238 lb (108 kg)  Height: 5\' 4"  (1.626 m)   98% on RA BMI Readings from Last 3 Encounters:  11/15/20 40.85 kg/m  10/12/20 40.92 kg/m  07/22/20 40.34 kg/m   Wt Readings from Last 3 Encounters:  11/15/20 238 lb (108 kg)  10/12/20 238 lb 6.4 oz (108.1 kg)  07/22/20 235 lb (106.6 kg)     CBC    Component Value Date/Time   WBC 5.2 06/01/2020 1610   RBC 4.76 06/01/2020 1610   HGB 13.2 06/01/2020 1610   HCT 40.5 06/01/2020 1610   PLT 168.0 06/01/2020 1610   MCV 85.1 06/01/2020 1610   MCH 27.8 05/24/2020 0540   MCHC 32.6 06/01/2020 1610   RDW 14.4 06/01/2020 1610   LYMPHSABS 1.5  06/01/2020 1610   MONOABS 0.6 06/01/2020 1610   EOSABS 0.0 06/01/2020 1610   BASOSABS 0.0 06/01/2020 1610   Chest Imaging: Chest x-ray 11/17/2016: Enlarged cardiac silhouette, no pulmonary edema The patient's images have been independently reviewed by me.    Pulmonary Functions Testing Results: PFT Results Latest Ref Rng & Units 01/22/2019 07/21/2013  FVC-Pre L 1.60 -  FVC-Predicted Pre % 61 56  FVC-Post L 1.66 -  FVC-Predicted Post % 63 -  Pre FEV1/FVC % % 89 89  Post FEV1/FCV % % 88 -  FEV1-Pre L 1.42 1.37  FEV1-Predicted Pre % 69 62  FEV1-Post L 1.45 -  DLCO uncorrected ml/min/mmHg 17.81 -  DLCO UNC% % 91 -  DLCO corrected ml/min/mmHg 17.03 -  DLCO COR %Predicted % 87 -  DLVA Predicted % 148 -  TLC L 3.13 -  TLC % Predicted % 65 -  RV % Predicted % 65 -   FeNO: No results found for: NITRICOXIDE  Pathology: None   Echocardiogram:  04/12/2016: Preserved EF 60-65%  Heart Catheterization: None     Assessment & Plan:   BMI 40.0-44.9, adult (HCC)  Mild intermittent asthma, unspecified whether complicated - Plan: SYMBICORT 160-4.5 MCG/ACT inhaler  SOB (shortness of breath)  Chest tightness  Discussion:  This is a 60 year old female moderate persistent asthma, obesity. Currently managed with Symbicort plus as needed albuterol. Overall respiratory status is stable and she has been marginally compliant with her maintenance inhalers.  Plan: Encourage compliance with Symbicort twice daily. At this time she is doing well with this even if it is just using it once a day. Continue as needed albuterol as needed. New refills given for these medications today. Patient was encouraged to start a weight loss program. And continue to work at this for calendar year 2022. Patient agrees.  Patient can see me in 6 months or as needed.   Current Outpatient Medications:  .  allopurinol (ZYLOPRIM) 100 MG tablet, Take 1 tablet (100 mg total) by mouth daily., Disp: 90 tablet, Rfl: 3 .  amLODipine (NORVASC) 5 MG tablet, TAKE 1 TABLET BY MOUTH EVERY DAY, Disp: 90 tablet, Rfl: 1 .  aspirin EC 325 MG tablet, Take 1 tablet (325 mg total) by mouth 2 (two) times daily., Disp: 84 tablet, Rfl: 0 .  azelastine (ASTELIN) 0.1 % nasal spray, Place 2 sprays into both nostrils 2 (two) times daily. Use in each nostril as directed, Disp: 30 mL, Rfl: 3 .  benzonatate (TESSALON) 200 MG capsule, Take 1 capsule (200 mg total) by mouth 3 (three) times daily as needed for cough., Disp: 30 capsule, Rfl: 1 .  Calcium Carbonate-Vit D-Min (CALCIUM 1200) 1200-1000 MG-UNIT CHEW, 1 po qd, Disp: 30 tablet, Rfl: 11 .  cetirizine (ZYRTEC) 10 MG tablet, TAKE 1 TABLET (10 MG TOTAL) BY MOUTH DAILY., Disp: 90 tablet, Rfl: 1 .  colchicine 0.6 MG tablet, Take 1 tablet (0.6 mg total) by mouth daily., Disp: 90 tablet, Rfl: 1 .  Cyanocobalamin (B-12) 1000 MCG SUBL, Dissolove 1  tablet under tongue once a day, Disp: 90 each, Rfl: 1 .  cyclobenzaprine (FLEXERIL) 10 MG tablet, TAKE 1 TABLET BY MOUTH THREE TIMES A DAY AS NEEDED FOR MUSCLE SPASMS, Disp: 30 tablet, Rfl: 2 .  diclofenac (VOLTAREN) 75 MG EC tablet, Take 1 tablet (75 mg total) by mouth 2 (two) times daily as needed., Disp: 60 tablet, Rfl: 3 .  diclofenac sodium (VOLTAREN) 1 % GEL, Apply 4 g topically 4 (four) times daily  as needed., Disp: 500 g, Rfl: 6 .  escitalopram (LEXAPRO) 10 MG tablet, TAKE 1 TABLET BY MOUTH EVERYDAY AT BEDTIME, Disp: 90 tablet, Rfl: 1 .  Fiber CHEW, Chew 1 tablet by mouth daily., Disp: , Rfl:  .  fluticasone (FLONASE) 50 MCG/ACT nasal spray, Place 2 sprays into both nostrils daily., Disp: 16 g, Rfl: 6 .  fluticasone (FLONASE) 50 MCG/ACT nasal spray, SPRAY 2 SPRAYS INTO EACH NOSTRIL EVERY DAY, Disp: 48 mL, Rfl: 1 .  glucose blood test strip, Use as instructed--- one touch verio test strips, Disp: 100 each, Rfl: 12 .  hydrochlorothiazide (HYDRODIURIL) 12.5 MG tablet, Take 1 tablet (12.5 mg total) by mouth daily., Disp: 90 tablet, Rfl: 1 .  hydrOXYzine (ATARAX/VISTARIL) 10 MG tablet, TAKE 1 TO 2 TABLETS BY MOUTH AT BEDTIME AS NEEDED FOR ITCHING, Disp: 180 tablet, Rfl: 1 .  ipratropium-albuterol (DUONEB) 0.5-2.5 (3) MG/3ML SOLN, Take 3 mLs by nebulization every 6 (six) hours as needed., Disp: 360 mL, Rfl: 3 .  Lancets (ONETOUCH ULTRASOFT) lancets, Use as instructed, Disp: 100 each, Rfl: 12 .  meclizine (ANTIVERT) 12.5 MG tablet, Take 1 tablet (12.5 mg total) by mouth 3 (three) times daily as needed for dizziness., Disp: 30 tablet, Rfl: 0 .  metaxalone (SKELAXIN) 800 MG tablet, TAKE 1 TABLET BY MOUTH THREE TIMES A DAY (Patient taking differently: Take 800 mg by mouth 3 (three) times daily.), Disp: 30 tablet, Rfl: 2 .  metFORMIN (GLUCOPHAGE-XR) 500 MG 24 hr tablet, Take 1 tablet (500 mg total) by mouth daily with breakfast., Disp: 90 tablet, Rfl: 1 .  montelukast (SINGULAIR) 10 MG tablet, Take 1  tablet (10 mg total) by mouth at bedtime., Disp: 90 tablet, Rfl: 3 .  Multiple Vitamin (MULTIVITAMIN) tablet, Take 1 tablet by mouth daily., Disp: , Rfl:  .  NONFORMULARY OR COMPOUNDED ITEM, Nebulizer   DX ASTHMA, Disp: 1 each, Rfl: 0 .  nystatin-triamcinolone (MYCOLOG II) cream, Apply twice daily to lower chest area/upper abdomen, Disp: 30 g, Rfl: 0 .  ofloxacin (FLOXIN OTIC) 0.3 % OTIC solution, Place 10 drops into the right ear daily., Disp: 10 mL, Rfl: 0 .  omeprazole (PRILOSEC) 20 MG capsule, Take 1 capsule (20 mg total) by mouth daily., Disp: 90 capsule, Rfl: 3 .  ondansetron (ZOFRAN ODT) 4 MG disintegrating tablet, Take 1 tablet (4 mg total) by mouth every 8 (eight) hours as needed for nausea or vomiting., Disp: 20 tablet, Rfl: 0 .  potassium chloride SA (KLOR-CON M20) 20 MEQ tablet, Take 1 tablet (20 mEq total) by mouth daily., Disp: 90 tablet, Rfl: 1 .  rOPINIRole (REQUIP) 0.25 MG tablet, TAKE 1 TABLET BY MOUTH AT BEDTIME FOR 4 DAYS THEN INCREASE TO 2 TABLETS AT BEDTIME IF NEEDED, Disp: 60 tablet, Rfl: 5 .  Spacer/Aero-Holding Chambers (AEROCHAMBER MV) inhaler, Use as instructed, Disp: 1 each, Rfl: 0 .  traMADol (ULTRAM) 50 MG tablet, TAKE 1 TABLET (50 MG TOTAL) BY MOUTH EVERY 8 (EIGHT) HOURS AS NEEDED FOR UP TO 5 DAYS., Disp: 16 tablet, Rfl: 0 .  triamcinolone cream (KENALOG) 0.1 %, Apply bid to hands, Disp: 30 g, Rfl: 0 .  SYMBICORT 160-4.5 MCG/ACT inhaler, Inhale 2 puffs into the lungs every 12 (twelve) hours., Disp: 1 each, Rfl: 3 .  VENTOLIN HFA 108 (90 Base) MCG/ACT inhaler, Inhale 2 puffs into the lungs every 6 (six) hours as needed for wheezing or shortness of breath., Disp: 18 g, Rfl: 5   Garner Nash, DO [facility]  11/15/2020 12:26 PM

## 2020-11-15 NOTE — Patient Instructions (Addendum)
Thank you for visiting Dr. Valeta Harms at [facility]. Today we recommend the following:  Meds ordered this encounter  Medications  . SYMBICORT 160-4.5 MCG/ACT inhaler    Sig: Inhale 2 puffs into the lungs every 12 (twelve) hours.    Dispense:  1 each    Refill:  3    Order Specific Question:   Lot Number?    Answer:   [id number]    Order Specific Question:   Expiration Date?    Answer:   11/27/2019    Order Specific Question:   Manufacturer?    Answer:   [employer] [[id number]]    Order Specific Question:   Quantity    Answer:   1  . VENTOLIN HFA 108 (90 Base) MCG/ACT inhaler    Sig: Inhale 2 puffs into the lungs every 6 (six) hours as needed for wheezing or shortness of breath.    Dispense:  18 g    Refill:  5   Return in about 6 months (around 05/15/2021) for with APP or Dr. Valeta Harms.    Please do your part to reduce the spread of COVID-19.

## 2020-11-16 ENCOUNTER — Institutional Professional Consult (permissible substitution): Payer: Federal, State, Local not specified - PPO | Admitting: Plastic Surgery

## 2020-11-24 ENCOUNTER — Other Ambulatory Visit: Payer: Self-pay | Admitting: *Deleted

## 2020-11-24 DIAGNOSIS — J4520 Mild intermittent asthma, uncomplicated: Secondary | ICD-10-CM

## 2020-11-24 MED ORDER — ALBUTEROL SULFATE HFA 108 (90 BASE) MCG/ACT IN AERS: 1.0000 | INHALATION_SPRAY | Freq: Four times a day (QID) | RESPIRATORY_TRACT | 6 refills | Status: DC | PRN

## 2020-11-27 ENCOUNTER — Other Ambulatory Visit: Payer: Self-pay | Admitting: Family Medicine

## 2020-11-27 DIAGNOSIS — Z889 Allergy status to unspecified drugs, medicaments and biological substances status: Secondary | ICD-10-CM

## 2020-12-06 ENCOUNTER — Ambulatory Visit: Payer: Federal, State, Local not specified - PPO | Admitting: Family Medicine

## 2020-12-10 ENCOUNTER — Other Ambulatory Visit: Payer: Self-pay | Admitting: Family Medicine

## 2020-12-10 DIAGNOSIS — E1151 Type 2 diabetes mellitus with diabetic peripheral angiopathy without gangrene: Secondary | ICD-10-CM

## 2020-12-10 DIAGNOSIS — E1165 Type 2 diabetes mellitus with hyperglycemia: Secondary | ICD-10-CM

## 2020-12-10 DIAGNOSIS — IMO0002 Reserved for concepts with insufficient information to code with codable children: Secondary | ICD-10-CM

## 2020-12-14 ENCOUNTER — Ambulatory Visit: Payer: Federal, State, Local not specified - PPO | Admitting: Family Medicine

## 2020-12-28 ENCOUNTER — Ambulatory Visit (INDEPENDENT_AMBULATORY_CARE_PROVIDER_SITE_OTHER): Payer: Federal, State, Local not specified - PPO | Admitting: Family Medicine

## 2020-12-28 ENCOUNTER — Other Ambulatory Visit: Payer: Self-pay

## 2020-12-28 ENCOUNTER — Encounter: Payer: Self-pay | Admitting: Family Medicine

## 2020-12-28 VITALS — BP 136/100 | HR 88 | Temp 98.6°F | Resp 18 | Ht 64.0 in | Wt 238.8 lb

## 2020-12-28 DIAGNOSIS — R252 Cramp and spasm: Secondary | ICD-10-CM

## 2020-12-28 DIAGNOSIS — M79641 Pain in right hand: Secondary | ICD-10-CM | POA: Diagnosis not present

## 2020-12-28 DIAGNOSIS — E1165 Type 2 diabetes mellitus with hyperglycemia: Secondary | ICD-10-CM

## 2020-12-28 LAB — GLUCOSE, POCT (MANUAL RESULT ENTRY): POC Glucose: 104 mg/dl — AB (ref 70–99)

## 2020-12-28 MED ORDER — BLOOD GLUCOSE MONITOR KIT: PACK | 0 refills | Status: DC

## 2020-12-28 NOTE — Patient Instructions (Signed)
Hand Pain Many things can cause hand pain. Some common causes are:  An injury.  Repeating the same movement with your hand over and over (overuse).  Osteoporosis.  Arthritis.  Lumps in the tendons or joints of the hand and wrist (ganglion cysts).  Nerve compression syndromes (carpal tunnel syndrome).  Inflammation of the tendons (tendinitis).  Infection. Follow these instructions at home: Pay attention to any changes in your symptoms. Take these actions to help with your discomfort: Managing pain, stiffness, and swelling  Take over-the-counter and prescription medicines only as told by your health care provider.  Wear a hand splint or support as told by your health care provider.  If directed, put ice on the affected area: ? Put ice in a plastic bag. ? Place a towel between your skin and the bag. ? Leave the ice on for 20 minutes, 2-3 times a day.   Activity  Take breaks from repetitive activity often.  Avoid activities that make your pain worse.  Minimize stress on your hands and wrists as much as possible.  Do stretches or exercises as told by your health care provider.  Do not do activities that make your pain worse. Contact a health care provider if:  Your pain does not get better after a few days of self-care.  Your pain gets worse.  Your pain affects your ability to do your daily activities. Get help right away if:  Your hand becomes warm, red, or swollen.  Your hand is numb or tingling.  Your hand is extremely swollen or deformed.  Your hand or fingers turn white or blue.  You cannot move your hand, wrist, or fingers. Summary  Many things can cause hand pain.  Contact your health care provider if your pain does not get better after a few days of self care.  Minimize stress on your hands and wrists as much as possible.  Do not do activities that make your pain worse. This information is not intended to replace advice given to you by your  health care provider. Make sure you discuss any questions you have with your health care provider. Document Revised: 07/19/2018 Document Reviewed: 07/19/2018 Elsevier Patient Education  2021 [employer]  

## 2020-12-28 NOTE — Progress Notes (Signed)
Patient ID: Rhonda Morrow, female    DOB: 09-Jan-1961  Age: 60 y.o. 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  She also c/o L foot pain -- but sees podiatry  The pain In her R hand is in the pain and only while driving fork lift.  Her fingers feels like they get stuck.  She has no pain with driving a car.   Translator is in the room as well  Review of Systems  Constitutional: Negative for appetite change, diaphoresis, fatigue and unexpected weight change.  Eyes: Negative for pain, redness and visual disturbance.  Respiratory: Negative for cough, chest tightness, shortness of breath and wheezing.   Cardiovascular: Negative for chest pain, palpitations and leg swelling.  Endocrine: Negative for cold intolerance, heat intolerance, polydipsia, polyphagia and polyuria.  Genitourinary: Negative for difficulty urinating, dysuria and frequency.  Musculoskeletal: Positive for myalgias. Negative for arthralgias and joint swelling.  Neurological: Negative for dizziness, light-headedness, numbness and headaches.    History Past Medical History:  Diagnosis Date  . Arthritis   . Asthma   . Complication of anesthesia    one time woke up and was vey scared,17 yrs ago  . Deaf   . Diabetes mellitus without complication ([place])   . GERD (gastroesophageal reflux disease)   . Hypertension   . Left groin pain   . Thyroid disease     She has a past surgical history that includes Cesarean section; Cardiac catheterization; Esophageal manometry (N/A, 09/29/2013); Cholecystectomy (N/A, 06/20/2016); and Total hip arthroplasty (Left, 02/21/2017).   Her family history includes Diabetes in her father and mother; Heart disease in her mother.She reports that she has never smoked. She has never used smokeless tobacco. She reports that she does not drink alcohol and does not use  drugs.  Current Outpatient Medications on File Prior to Visit  Medication Sig Dispense Refill  . albuterol (VENTOLIN HFA) 108 (90 Base) MCG/ACT inhaler Inhale 1-2 puffs into the lungs every 6 (six) hours as needed for wheezing or shortness of breath. 8 g 6  . allopurinol (ZYLOPRIM) 100 MG tablet Take 1 tablet (100 mg total) by mouth daily. 90 tablet 3  . amLODipine (NORVASC) 5 MG tablet TAKE 1 TABLET BY MOUTH EVERY DAY 90 tablet 1  . aspirin EC 325 MG tablet Take 1 tablet (325 mg total) by mouth 2 (two) times daily. 84 tablet 0  . azelastine (ASTELIN) 0.1 % nasal spray Place 2 sprays into both nostrils 2 (two) times daily. Use in each nostril as directed 30 mL 3  . benzonatate (TESSALON) 200 MG capsule Take 1 capsule (200 mg total) by mouth 3 (three) times daily as needed for cough. 30 capsule 1  . Calcium Carbonate-Vit D-Min (CALCIUM 1200) 1200-1000 MG-UNIT CHEW 1 po qd 30 tablet 11  . cetirizine (ZYRTEC) 10 MG tablet TAKE 1 TABLET BY MOUTH EVERY DAY 90 tablet 1  . colchicine 0.6 MG tablet Take 1 tablet (0.6 mg total) by mouth daily. 90 tablet 1  . Cyanocobalamin (B-12) 1000 MCG SUBL Dissolove 1 tablet under tongue once a day 90 each 1  . cyclobenzaprine (FLEXERIL) 10 MG tablet TAKE 1 TABLET BY MOUTH THREE TIMES A DAY AS NEEDED FOR MUSCLE SPASMS 30 tablet 2  . diclofenac (VOLTAREN) 75 MG EC tablet Take 1 tablet (75 mg total) by mouth 2 (  two) times daily as needed. 60 tablet 3  . diclofenac sodium (VOLTAREN) 1 % GEL Apply 4 g topically 4 (four) times daily as needed. 500 g 6  . escitalopram (LEXAPRO) 10 MG tablet TAKE 1 TABLET BY MOUTH EVERYDAY AT BEDTIME 90 tablet 1  . Fiber CHEW Chew 1 tablet by mouth daily.    . fluticasone (FLONASE) 50 MCG/ACT nasal spray Place 2 sprays into both nostrils daily. 16 g 6  . fluticasone (FLONASE) 50 MCG/ACT nasal spray SPRAY 2 SPRAYS INTO EACH NOSTRIL EVERY DAY 48 mL 1  . glucose blood test strip Use as instructed--- one touch verio test strips 100 each 12  .  hydrochlorothiazide (HYDRODIURIL) 12.5 MG tablet Take 1 tablet (12.5 mg total) by mouth daily. 90 tablet 1  . hydrOXYzine (ATARAX/VISTARIL) 10 MG tablet TAKE 1 TO 2 TABLETS BY MOUTH AT BEDTIME AS NEEDED FOR ITCHING 180 tablet 1  . ipratropium-albuterol (DUONEB) 0.5-2.5 (3) MG/3ML SOLN Take 3 mLs by nebulization every 6 (six) hours as needed. 360 mL 3  . Lancets (ONETOUCH ULTRASOFT) lancets Use as instructed 100 each 12  . meclizine (ANTIVERT) 12.5 MG tablet Take 1 tablet (12.5 mg total) by mouth 3 (three) times daily as needed for dizziness. 30 tablet 0  . metaxalone (SKELAXIN) 800 MG tablet TAKE 1 TABLET BY MOUTH THREE TIMES A DAY (Patient taking differently: Take 800 mg by mouth 3 (three) times daily.) 30 tablet 2  . metFORMIN (GLUCOPHAGE-XR) 500 MG 24 hr tablet TAKE 1 TABLET BY MOUTH EVERY DAY WITH BREAKFAST 90 tablet 1  . montelukast (SINGULAIR) 10 MG tablet Take 1 tablet (10 mg total) by mouth at bedtime. 90 tablet 3  . Multiple Vitamin (MULTIVITAMIN) tablet Take 1 tablet by mouth daily.    . NONFORMULARY OR COMPOUNDED ITEM Nebulizer   DX ASTHMA 1 each 0  . nystatin-triamcinolone (MYCOLOG II) cream Apply twice daily to lower chest area/upper abdomen 30 g 0  . ofloxacin (FLOXIN OTIC) 0.3 % OTIC solution Place 10 drops into the right ear daily. 10 mL 0  . omeprazole (PRILOSEC) 20 MG capsule Take 1 capsule (20 mg total) by mouth daily. 90 capsule 3  . ondansetron (ZOFRAN ODT) 4 MG disintegrating tablet Take 1 tablet (4 mg total) by mouth every 8 (eight) hours as needed for nausea or vomiting. 20 tablet 0  . potassium chloride SA (KLOR-CON M20) 20 MEQ tablet Take 1 tablet (20 mEq total) by mouth daily. 90 tablet 1  . rOPINIRole (REQUIP) 0.25 MG tablet TAKE 1 TABLET BY MOUTH AT BEDTIME FOR 4 DAYS THEN INCREASE TO 2 TABLETS AT BEDTIME IF NEEDED 60 tablet 5  . Spacer/Aero-Holding Chambers (AEROCHAMBER MV) inhaler Use as instructed 1 each 0  . SYMBICORT 160-4.5 MCG/ACT inhaler Inhale 2 puffs into the  lungs every 12 (twelve) hours. 1 each 3  . traMADol (ULTRAM) 50 MG tablet TAKE 1 TABLET (50 MG TOTAL) BY MOUTH EVERY 8 (EIGHT) HOURS AS NEEDED FOR UP TO 5 DAYS. 16 tablet 0  . triamcinolone cream (KENALOG) 0.1 % Apply bid to hands 30 g 0   No current facility-administered medications on file prior to visit.     Objective:  Objective  Physical Exam Vitals and nursing note reviewed.  Constitutional:      Appearance: She is well-developed and well-nourished.  HENT:     Head: Normocephalic and atraumatic.  Eyes:     Extraocular Movements: EOM normal.     Conjunctiva/sclera: Conjunctivae normal.  Neck:  Thyroid: No thyromegaly.     Vascular: No carotid bruit or JVD.  Cardiovascular:     Rate and Rhythm: Normal rate and regular rhythm.     Heart sounds: Normal heart sounds. No murmur heard.   Pulmonary:     Effort: Pulmonary effort is normal. No respiratory distress.     Breath sounds: Normal breath sounds. No wheezing or rales.  Chest:     Chest wall: No tenderness.  Musculoskeletal:        General: Tenderness present. No swelling, deformity or edema.     Right hand: Tenderness present. No swelling, deformity or bony tenderness. Normal range of motion. Normal strength.       Arms:     Cervical back: Normal range of motion and neck supple.  Neurological:     Mental Status: She is alert and oriented to person, place, and time.  Psychiatric:        Mood and Affect: Mood and affect normal.    BP (!) 136/100 (BP Location: Right Arm, Patient Position: Sitting, Cuff Size: Normal)   Pulse 88   Temp 98.6 F (37 C) (Oral)   Resp 18   Ht 5' 4" (1.626 m)   Wt 238 lb 12.8 oz (108.3 kg)   LMP 05/20/2012   SpO2 97%   BMI 40.99 kg/m  Wt Readings from Last 3 Encounters:  12/28/20 238 lb 12.8 oz (108.3 kg)  11/15/20 238 lb (108 kg)  10/12/20 238 lb 6.4 oz (108.1 kg)     Lab Results  Component Value Date   WBC 5.2 06/01/2020   HGB 13.2 06/01/2020   HCT 40.5 06/01/2020    PLT 168.0 06/01/2020   GLUCOSE 112 (H) 07/22/2020   CHOL 165 07/22/2020   TRIG 80 07/22/2020   HDL 57 07/22/2020   LDLCALC 91 07/22/2020   ALT 14 07/22/2020   AST 16 07/22/2020   NA 139 07/22/2020   K 3.7 07/22/2020   CL 101 07/22/2020   CREATININE 0.76 07/22/2020   BUN 12 07/22/2020   CO2 28 07/22/2020   TSH 0.74 04/20/2020   INR 1.05 02/12/2017   HGBA1C 6.8 (H) 07/22/2020   MICROALBUR 0.5 07/22/2020    MM 3D SCREEN BREAST BILATERAL  Result Date: 09/30/2020 CLINICAL DATA:  Screening. EXAM: DIGITAL SCREENING BILATERAL MAMMOGRAM WITH TOMO AND CAD COMPARISON:  Previous exam(s). ACR Breast Density Category a: The breast tissue is almost entirely fatty. FINDINGS: There are no findings suspicious for malignancy. Images were processed with CAD. IMPRESSION: No mammographic evidence of malignancy. A result letter of this screening mammogram will be mailed directly to the patient. RECOMMENDATION: Screening mammogram in one year. (Code:SM-B-01Y) BI-RADS CATEGORY  1: Negative. Electronically Signed   By: David  Ormond M.D.   On: 09/30/2020 09:12     Assessment & Plan:  Plan  I am having Rhonda Morrow start on blood glucose meter kit and supplies. I am also having her maintain her multivitamin, Fiber, NONFORMULARY OR COMPOUNDED ITEM, azelastine, aspirin EC, metaxalone, meclizine, diclofenac sodium, montelukast, ipratropium-albuterol, AeroChamber MV, B-12, triamcinolone, nystatin-triamcinolone, colchicine, traMADol, fluticasone, hydrochlorothiazide, potassium chloride SA, onetouch ultrasoft, glucose blood, Calcium 1200, ofloxacin, allopurinol, ondansetron, fluticasone, benzonatate, cyclobenzaprine, escitalopram, omeprazole, hydrOXYzine, rOPINIRole, diclofenac, amLODipine, Symbicort, albuterol, cetirizine, and metFORMIN.  Meds ordered this encounter  Medications  . blood glucose meter kit and supplies KIT    Sig: Dispense based on patient and insurance preference. Use up to four times daily as  directed. (FOR ICD-9 250.00, 250.01).      Dispense:  1 each    Refill:  0    Order Specific Question:   Number of strips    Answer:   100    Order Specific Question:   Number of lancets    Answer:   100    Problem List Items Addressed This Visit      Unprioritized   Hand pain, right    Wrist splint Consider hand specialist if no improvement with splint  ? cts       Leg cramping    Check labs Pt will f/u podiatry with foot pain       Other Visit Diagnoses    Uncontrolled type 2 diabetes mellitus with hyperglycemia (HCC)    -  Primary   Relevant Medications   blood glucose meter kit and supplies KIT   Other Relevant Orders   POCT glucose (manual entry) (Completed)   Hemoglobin A1c   Lipid panel   Muscle cramps       Relevant Orders   Comprehensive metabolic panel   Magnesium      Follow-up: Return in about 3 months (around 03/27/2021).  Yvonne R Lowne Chase, DO     

## 2020-12-29 DIAGNOSIS — M79641 Pain in right hand: Secondary | ICD-10-CM | POA: Insufficient documentation

## 2020-12-29 LAB — COMPREHENSIVE METABOLIC PANEL
ALT: 24 U/L (ref 0–35)
AST: 23 U/L (ref 0–37)
Albumin: 3.8 g/dL (ref 3.5–5.2)
Alkaline Phosphatase: 75 U/L (ref 39–117)
BUN: 13 mg/dL (ref 6–23)
CO2: 32 mEq/L (ref 19–32)
Calcium: 9.5 mg/dL (ref 8.4–10.5)
Chloride: 103 mEq/L (ref 96–112)
Creatinine, Ser: 0.84 mg/dL (ref 0.40–1.20)
GFR: 77.2 mL/min (ref >60.00)
Glucose, Bld: 89 mg/dL (ref 70–99)
Potassium: 4 mEq/L (ref 3.5–5.1)
Sodium: 141 mEq/L (ref 135–145)
Total Bilirubin: 0.4 mg/dL (ref 0.2–1.2)
Total Protein: 7 g/dL (ref 6.0–8.3)

## 2020-12-29 LAB — LIPID PANEL
Cholesterol: 178 mg/dL (ref 0–200)
HDL: 62.6 mg/dL (ref >39.00)
LDL Cholesterol: 101 mg/dL — ABNORMAL HIGH (ref 0–99)
NonHDL: 115.29
Total CHOL/HDL Ratio: 3
Triglycerides: 71 mg/dL (ref 0.0–149.0)
VLDL: 14.2 mg/dL (ref 0.0–40.0)

## 2020-12-29 LAB — MAGNESIUM: Magnesium: 2.1 mg/dL (ref 1.5–2.5)

## 2020-12-29 LAB — HEMOGLOBIN A1C: Hgb A1c MFr Bld: 7.2 % — ABNORMAL HIGH (ref 4.6–6.5)

## 2020-12-29 NOTE — Assessment & Plan Note (Signed)
Wrist splint Consider hand specialist if no improvement with splint  ? cts

## 2020-12-29 NOTE — Assessment & Plan Note (Signed)
Check labs Pt will f/u podiatry with foot pain

## 2021-01-04 ENCOUNTER — Telehealth: Payer: Self-pay | Admitting: Family Medicine

## 2021-01-04 ENCOUNTER — Other Ambulatory Visit: Payer: Self-pay | Admitting: *Deleted

## 2021-01-04 DIAGNOSIS — IMO0002 Reserved for concepts with insufficient information to code with codable children: Secondary | ICD-10-CM

## 2021-01-04 DIAGNOSIS — E1151 Type 2 diabetes mellitus with diabetic peripheral angiopathy without gangrene: Secondary | ICD-10-CM

## 2021-01-04 MED ORDER — B-12 1000 MCG SL SUBL
SUBLINGUAL_TABLET | SUBLINGUAL | 1 refills | Status: DC
Start: 2021-01-04 — End: 2021-12-05

## 2021-01-04 MED ORDER — METFORMIN HCL ER 500 MG PO TB24: ORAL_TABLET | ORAL | 1 refills | Status: DC

## 2021-01-04 MED ORDER — ROSUVASTATIN CALCIUM 10 MG PO TABS: 10.0000 mg | ORAL_TABLET | Freq: Every day | ORAL | 2 refills | Status: DC

## 2021-01-04 NOTE — Telephone Encounter (Signed)
Patient is calling in reference to her lab results.  Patient is requesting a call back

## 2021-01-04 NOTE — Telephone Encounter (Signed)
Patient notified of lab results, new meds sent in.

## 2021-01-06 ENCOUNTER — Telehealth: Payer: Self-pay | Admitting: Family Medicine

## 2021-01-06 NOTE — Telephone Encounter (Signed)
Patient need explanation on what medication she should take. Patient also states she is missing her cholesterol medication ( she does not know the name)   [id number] - [place], [place] [address] Phone:  [phone]  Fax:  [phone]

## 2021-01-06 NOTE — Telephone Encounter (Signed)
Spoke with patient/translator and advised of the medications that were sent to the pharmacy.

## 2021-01-11 ENCOUNTER — Other Ambulatory Visit: Payer: Self-pay

## 2021-01-11 ENCOUNTER — Ambulatory Visit (INDEPENDENT_AMBULATORY_CARE_PROVIDER_SITE_OTHER): Payer: Federal, State, Local not specified - PPO | Admitting: Plastic Surgery

## 2021-01-11 ENCOUNTER — Encounter: Payer: Self-pay | Admitting: Plastic Surgery

## 2021-01-11 VITALS — BP 139/78 | HR 65 | Ht 64.0 in | Wt 237.0 lb

## 2021-01-11 DIAGNOSIS — G8929 Other chronic pain: Secondary | ICD-10-CM | POA: Diagnosis not present

## 2021-01-11 DIAGNOSIS — N62 Hypertrophy of breast: Secondary | ICD-10-CM | POA: Diagnosis not present

## 2021-01-11 DIAGNOSIS — M5442 Lumbago with sciatica, left side: Secondary | ICD-10-CM | POA: Diagnosis not present

## 2021-01-11 DIAGNOSIS — M25511 Pain in right shoulder: Secondary | ICD-10-CM

## 2021-01-11 NOTE — Progress Notes (Signed)
Patient ID: Rhonda Morrow, female    DOB: [birth year], 60 y.o.   MRN: [id number]   Chief Complaint  Patient presents with  . consult    Mammary Hyperplasia: The patient is a 60 y.o. female with a history of mammary hyperplasia for several years.  She has extremely large breasts causing symptoms that include the following: Back pain in the upper and lower back, including neck pain. She pulls or pins her bra straps to provide better lift and relief of the pressure and pain. She notices relief by holding her breast up manually.  Her shoulder straps cause grooves and pain and pressure that requires padding for relief. Pain medication is sometimes required with motrin and tylenol.  Activities that are hindered by enlarged breasts include: exercise and running.  She has tried supportive clothing as well as fitted bras without improvement.  Her breasts are extremely large and fairly symmetric.  She has hyperpigmentation of the inframammary area on both sides.  The sternal to nipple distance on the right is 44 cm and the left is 43 cm.  The IMF distance is 19 cm.  She is 5 feet 4 inches tall and weighs 237 pounds.  Preoperative bra size = DDD/E cup.  The estimated excess breast tissue to be removed at the time of surgery = 650 grams on the left and 650 grams on the right.  Mammogram history: November 2021 and was negative.  Family history of breast cancer: None but positive for kidney cancer in her father.  Tobacco use: No smoking history.  Last hemoglobin A1c was 7.2 in February 2022.  The patient does have diabetes, obstructive sleep apnea, hyperlipidemia and restless leg syndrome.  She will have her blood work checked again in 2 months.  She has not had physical therapy for her back yet.    Review of Systems  Constitutional: Positive for activity change. Negative for appetite change.  HENT: Negative.   Eyes: Negative.   Respiratory: Negative.  Negative for chest tightness and shortness of breath.    Cardiovascular: Negative for leg swelling.  Gastrointestinal: Positive for abdominal pain. Negative for abdominal distention.  Endocrine: Negative.   Genitourinary: Negative.   Musculoskeletal: Positive for back pain and neck pain.  Hematological: Negative.   Psychiatric/Behavioral: Negative.     Past Medical History:  Diagnosis Date  . Arthritis   . Asthma   . Complication of anesthesia    one time woke up and was vey scared,17 yrs ago  . Deaf   . Diabetes mellitus without complication ([place])   . GERD (gastroesophageal reflux disease)   . Hypertension   . Left groin pain   . Thyroid disease     Past Surgical History:  Procedure Laterality Date  . CARDIAC CATHETERIZATION    . CESAREAN SECTION    . CHOLECYSTECTOMY N/A 06/20/2016   Procedure: LAPAROSCOPIC CHOLECYSTECTOMY;  Surgeon: Rolm Bookbinder, MD;  Location: [facility];  Service: General;  Laterality: N/A;  . ESOPHAGEAL MANOMETRY N/A 09/29/2013   Procedure: ESOPHAGEAL MANOMETRY (EM);  Surgeon: Milus Banister, MD;  Location: [facility];  Service: Endoscopy;  Laterality: N/A;  . TOTAL HIP ARTHROPLASTY Left 02/21/2017   Procedure: LEFT TOTAL HIP ARTHROPLASTY ANTERIOR APPROACH;  Surgeon: Leandrew Koyanagi, MD;  Location: [facility];  Service: Orthopedics;  Laterality: Left;      Current Outpatient Medications:  .  albuterol (VENTOLIN HFA) 108 (90 Base) MCG/ACT inhaler, Inhale 1-2 puffs into the lungs every 6 (six) hours  as needed for wheezing or shortness of breath., Disp: 8 g, Rfl: 6 .  allopurinol (ZYLOPRIM) 100 MG tablet, Take 1 tablet (100 mg total) by mouth daily., Disp: 90 tablet, Rfl: 3 .  amLODipine (NORVASC) 5 MG tablet, TAKE 1 TABLET BY MOUTH EVERY DAY, Disp: 90 tablet, Rfl: 1 .  aspirin EC 325 MG tablet, Take 1 tablet (325 mg total) by mouth 2 (two) times daily., Disp: 84 tablet, Rfl: 0 .  azelastine (ASTELIN) 0.1 % nasal spray, Place 2 sprays into both nostrils 2 (two) times daily. Use in each nostril as directed, Disp: 30 mL,  Rfl: 3 .  benzonatate (TESSALON) 200 MG capsule, Take 1 capsule (200 mg total) by mouth 3 (three) times daily as needed for cough., Disp: 30 capsule, Rfl: 1 .  blood glucose meter kit and supplies KIT, Dispense based on patient and insurance preference. Use up to four times daily as directed. (FOR ICD-9 250.00, 250.01)., Disp: 1 each, Rfl: 0 .  Calcium Carbonate-Vit D-Min (CALCIUM 1200) 1200-1000 MG-UNIT CHEW, 1 po qd, Disp: 30 tablet, Rfl: 11 .  cetirizine (ZYRTEC) 10 MG tablet, TAKE 1 TABLET BY MOUTH EVERY DAY, Disp: 90 tablet, Rfl: 1 .  colchicine 0.6 MG tablet, Take 1 tablet (0.6 mg total) by mouth daily., Disp: 90 tablet, Rfl: 1 .  Cyanocobalamin (B-12) 1000 MCG SUBL, Dissolove 1 tablet under tongue once a day, Disp: 90 tablet, Rfl: 1 .  cyclobenzaprine (FLEXERIL) 10 MG tablet, TAKE 1 TABLET BY MOUTH THREE TIMES A DAY AS NEEDED FOR MUSCLE SPASMS, Disp: 30 tablet, Rfl: 2 .  diclofenac (VOLTAREN) 75 MG EC tablet, Take 1 tablet (75 mg total) by mouth 2 (two) times daily as needed., Disp: 60 tablet, Rfl: 3 .  diclofenac sodium (VOLTAREN) 1 % GEL, Apply 4 g topically 4 (four) times daily as needed., Disp: 500 g, Rfl: 6 .  escitalopram (LEXAPRO) 10 MG tablet, TAKE 1 TABLET BY MOUTH EVERYDAY AT BEDTIME, Disp: 90 tablet, Rfl: 1 .  Fiber CHEW, Chew 1 tablet by mouth daily., Disp: , Rfl:  .  fluticasone (FLONASE) 50 MCG/ACT nasal spray, Place 2 sprays into both nostrils daily., Disp: 16 g, Rfl: 6 .  fluticasone (FLONASE) 50 MCG/ACT nasal spray, SPRAY 2 SPRAYS INTO EACH NOSTRIL EVERY DAY, Disp: 48 mL, Rfl: 1 .  glucose blood test strip, Use as instructed--- one touch verio test strips, Disp: 100 each, Rfl: 12 .  hydrochlorothiazide (HYDRODIURIL) 12.5 MG tablet, Take 1 tablet (12.5 mg total) by mouth daily., Disp: 90 tablet, Rfl: 1 .  hydrOXYzine (ATARAX/VISTARIL) 10 MG tablet, TAKE 1 TO 2 TABLETS BY MOUTH AT BEDTIME AS NEEDED FOR ITCHING, Disp: 180 tablet, Rfl: 1 .  ipratropium-albuterol (DUONEB) 0.5-2.5  (3) MG/3ML SOLN, Take 3 mLs by nebulization every 6 (six) hours as needed., Disp: 360 mL, Rfl: 3 .  Lancets (ONETOUCH ULTRASOFT) lancets, Use as instructed, Disp: 100 each, Rfl: 12 .  meclizine (ANTIVERT) 12.5 MG tablet, Take 1 tablet (12.5 mg total) by mouth 3 (three) times daily as needed for dizziness., Disp: 30 tablet, Rfl: 0 .  metaxalone (SKELAXIN) 800 MG tablet, TAKE 1 TABLET BY MOUTH THREE TIMES A DAY (Patient taking differently: Take 800 mg by mouth 3 (three) times daily.), Disp: 30 tablet, Rfl: 2 .  metFORMIN (GLUCOPHAGE-XR) 500 MG 24 hr tablet, TAKE 2 TABLET BY MOUTH EVERY DAY WITH BREAKFAST, Disp: 60 tablet, Rfl: 1 .  montelukast (SINGULAIR) 10 MG tablet, Take 1 tablet (10 mg total) by mouth at  bedtime., Disp: 90 tablet, Rfl: 3 .  Multiple Vitamin (MULTIVITAMIN) tablet, Take 1 tablet by mouth daily., Disp: , Rfl:  .  NONFORMULARY OR COMPOUNDED ITEM, Nebulizer   DX ASTHMA, Disp: 1 each, Rfl: 0 .  nystatin-triamcinolone (MYCOLOG II) cream, Apply twice daily to lower chest area/upper abdomen, Disp: 30 g, Rfl: 0 .  ofloxacin (FLOXIN OTIC) 0.3 % OTIC solution, Place 10 drops into the right ear daily., Disp: 10 mL, Rfl: 0 .  omeprazole (PRILOSEC) 20 MG capsule, Take 1 capsule (20 mg total) by mouth daily., Disp: 90 capsule, Rfl: 3 .  ondansetron (ZOFRAN ODT) 4 MG disintegrating tablet, Take 1 tablet (4 mg total) by mouth every 8 (eight) hours as needed for nausea or vomiting., Disp: 20 tablet, Rfl: 0 .  potassium chloride SA (KLOR-CON M20) 20 MEQ tablet, Take 1 tablet (20 mEq total) by mouth daily., Disp: 90 tablet, Rfl: 1 .  rOPINIRole (REQUIP) 0.25 MG tablet, TAKE 1 TABLET BY MOUTH AT BEDTIME FOR 4 DAYS THEN INCREASE TO 2 TABLETS AT BEDTIME IF NEEDED, Disp: 60 tablet, Rfl: 5 .  rosuvastatin (CRESTOR) 10 MG tablet, Take 1 tablet (10 mg total) by mouth daily., Disp: 30 tablet, Rfl: 2 .  Spacer/Aero-Holding Chambers (AEROCHAMBER MV) inhaler, Use as instructed, Disp: 1 each, Rfl: 0 .  SYMBICORT  160-4.5 MCG/ACT inhaler, Inhale 2 puffs into the lungs every 12 (twelve) hours., Disp: 1 each, Rfl: 3 .  traMADol (ULTRAM) 50 MG tablet, TAKE 1 TABLET (50 MG TOTAL) BY MOUTH EVERY 8 (EIGHT) HOURS AS NEEDED FOR UP TO 5 DAYS., Disp: 16 tablet, Rfl: 0 .  triamcinolone cream (KENALOG) 0.1 %, Apply bid to hands, Disp: 30 g, Rfl: 0   Objective:   Vitals:   01/11/21 1349  BP: 139/78  Pulse: 65  SpO2: 100%    Physical Exam Vitals and nursing note reviewed.  Constitutional:      Appearance: Normal appearance.  HENT:     Head: Normocephalic and atraumatic.  Cardiovascular:     Rate and Rhythm: Normal rate.     Pulses: Normal pulses.  Pulmonary:     Effort: Pulmonary effort is normal. No respiratory distress.  Abdominal:     General: Abdomen is flat. There is no distension.     Tenderness: There is no abdominal tenderness.  Musculoskeletal:        General: No swelling.  Skin:    General: Skin is warm.     Capillary Refill: Capillary refill takes less than 2 seconds.     Coloration: Skin is not jaundiced.     Findings: No bruising.  Neurological:     General: No focal deficit present.     Mental Status: She is alert and oriented to person, place, and time.  Psychiatric:        Mood and Affect: Mood normal.        Behavior: Behavior normal.        Thought Content: Thought content normal.     Assessment & Plan:  Large breasts  Chronic right shoulder pain  Acute left-sided low back pain with left-sided sciatica  The patient is a good candidate for bilateral breast reduction with possible liposuction.  She may need the amputation technique.  We will set her up for physical therapy.  She knows to call us when she has her new hemoglobin A1c and has completed physical therapy.  [place], DO

## 2021-01-24 ENCOUNTER — Ambulatory Visit: Payer: Federal, State, Local not specified - PPO

## 2021-01-24 ENCOUNTER — Ambulatory Visit (INDEPENDENT_AMBULATORY_CARE_PROVIDER_SITE_OTHER): Payer: Federal, State, Local not specified - PPO | Admitting: Family Medicine

## 2021-01-24 ENCOUNTER — Ambulatory Visit (HOSPITAL_BASED_OUTPATIENT_CLINIC_OR_DEPARTMENT_OTHER)
Admission: RE | Admit: 2021-01-24 | Discharge: 2021-01-24 | Disposition: A | Discharge status: Home / Self Care | Payer: Federal, State, Local not specified - PPO | Source: Ambulatory Visit | Attending: Family Medicine | Admitting: Family Medicine

## 2021-01-24 ENCOUNTER — Encounter: Payer: Self-pay | Admitting: Family Medicine

## 2021-01-24 ENCOUNTER — Other Ambulatory Visit: Payer: Self-pay

## 2021-01-24 VITALS — BP 110/62 | HR 89 | Temp 98.9°F | Resp 17 | Ht 64.0 in | Wt 239.0 lb

## 2021-01-24 DIAGNOSIS — Z0000 Encounter for general adult medical examination without abnormal findings: Secondary | ICD-10-CM | POA: Insufficient documentation

## 2021-01-24 DIAGNOSIS — M25512 Pain in left shoulder: Secondary | ICD-10-CM | POA: Insufficient documentation

## 2021-01-24 DIAGNOSIS — Z01810 Encounter for preprocedural cardiovascular examination: Secondary | ICD-10-CM | POA: Insufficient documentation

## 2021-01-24 DIAGNOSIS — M25511 Pain in right shoulder: Secondary | ICD-10-CM | POA: Insufficient documentation

## 2021-01-24 DIAGNOSIS — M19011 Primary osteoarthritis, right shoulder: Secondary | ICD-10-CM | POA: Diagnosis not present

## 2021-01-24 DIAGNOSIS — M79671 Pain in right foot: Secondary | ICD-10-CM | POA: Insufficient documentation

## 2021-01-24 DIAGNOSIS — E1165 Type 2 diabetes mellitus with hyperglycemia: Secondary | ICD-10-CM

## 2021-01-24 DIAGNOSIS — E2839 Other primary ovarian failure: Secondary | ICD-10-CM | POA: Diagnosis not present

## 2021-01-24 DIAGNOSIS — M25561 Pain in right knee: Secondary | ICD-10-CM

## 2021-01-24 DIAGNOSIS — M79672 Pain in left foot: Secondary | ICD-10-CM

## 2021-01-24 DIAGNOSIS — R1013 Epigastric pain: Secondary | ICD-10-CM

## 2021-01-24 DIAGNOSIS — Z96642 Presence of left artificial hip joint: Secondary | ICD-10-CM

## 2021-01-24 MED ORDER — OMEPRAZOLE 20 MG PO CPDR: 20.0000 mg | DELAYED_RELEASE_CAPSULE | Freq: Every day | ORAL | 3 refills | Status: DC

## 2021-01-24 MED ORDER — DICLOFENAC SODIUM 75 MG PO TBEC: 75.0000 mg | DELAYED_RELEASE_TABLET | Freq: Two times a day (BID) | ORAL | 3 refills | Status: DC | PRN

## 2021-01-24 MED ORDER — METAXALONE 800 MG PO TABS: 800.0000 mg | ORAL_TABLET | Freq: Three times a day (TID) | ORAL | 2 refills | Status: DC

## 2021-01-24 NOTE — Patient Instructions (Addendum)
Food Choices for Gastroesophageal Reflux Disease, Adult When you have gastroesophageal reflux disease (GERD), the foods you eat and your eating habits are very important. Choosing the right foods can help ease your discomfort. Think about working with a food expert (dietitian) to help you make good choices. What are tips for following this plan? Reading food labels  Look for foods that are low in saturated fat. Foods that may help with your symptoms include: ? Foods that have less than 5% of daily value (DV) of fat. ? Foods that have 0 grams of trans fat. Cooking  Do not fry your food.  Cook your food by baking, steaming, grilling, or broiling. These are all methods that do not need a lot of fat for cooking.  To add flavor, try to use herbs that are low in spice and acidity. Meal planning  Choose healthy foods that are low in fat, such as: ? Fruits and vegetables. ? Whole grains. ? Low-fat dairy products. ? Lean meats, fish, and poultry.  Eat small meals often instead of eating 3 large meals each day. Eat your meals slowly in a place where you are relaxed. Avoid bending over or lying down until 2-3 hours after eating.  Limit high-fat foods such as fatty meats or fried foods.  Limit your intake of fatty foods, such as oils, butter, and shortening.  Avoid the following as told by your doctor: ? Foods that cause symptoms. These may be different for different people. Keep a food diary to keep track of foods that cause symptoms. ? Alcohol. ? Drinking a lot of liquid with meals. ? Eating meals during the 2-3 hours before bed.   Lifestyle  Stay at a healthy weight. Ask your doctor what weight is healthy for you. If you need to lose weight, work with your doctor to do so safely.  Exercise for at least 30 minutes on 5 or more days each week, or as told by your doctor.  Wear loose-fitting clothes.  Do not smoke or use any products that contain nicotine or tobacco. If you need help  quitting, ask your doctor.  Sleep with the head of your bed higher than your feet. Use a wedge under the mattress or blocks under the bed frame to raise the head of the bed.  Chew sugar-free gum after meals. What foods should eat? Eat a healthy, well-balanced diet of fruits, vegetables, whole grains, low-fat dairy products, lean meats, fish, and poultry. Each person is different. Foods that may cause symptoms in one person may not cause any symptoms in another person. Work with your doctor to find foods that are safe for you. The items listed above may not be a complete list of what you can eat and drink. Contact a food expert for more options.   What foods should I avoid? Limiting some of these foods may help in managing the symptoms of GERD. Everyone is different. Talk with a food expert or your doctor to help you find the exact foods to avoid, if any. Fruits Any fruits prepared with added fat. Any fruits that cause symptoms. For some people, this may include citrus fruits, such as oranges, grapefruit, pineapple, and lemons. Vegetables Deep-fried vegetables. [place] fries. Any vegetables prepared with added fat. Any vegetables that cause symptoms. For some people, this may include tomatoes and tomato products, chili peppers, onions and garlic, and horseradish. Grains Pastries or quick breads with added fat. Meats and other proteins High-fat meats, such as fatty beef or pork,  hot dogs, ribs, ham, sausage, salami, and bacon. Fried meat or protein, including fried fish and fried chicken. Nuts and nut butters, in large amounts. Dairy Whole milk and chocolate milk. Sour cream. Cream. Ice cream. Cream cheese. Milkshakes. Fats and oils Butter. Margarine. Shortening. Ghee. Beverages Coffee and tea, with or without caffeine. Carbonated beverages. Sodas. Energy drinks. Fruit juice made with acidic fruits, such as orange or grapefruit. Tomato juice. Alcoholic drinks. Sweets and desserts Chocolate and  cocoa. Donuts. Seasonings and condiments Pepper. Peppermint and spearmint. Added salt. Any condiments, herbs, or seasonings that cause symptoms. For some people, this may include curry, hot sauce, or vinegar-based salad dressings. The items listed above may not be a complete list of what you should not eat and drink. Contact a food expert for more options. Questions to ask your doctor Diet and lifestyle changes are often the first steps that are taken to manage symptoms of GERD. If diet and lifestyle changes do not help, talk with your doctor about taking medicines. Where to find more information  International Foundation for Gastrointestinal Disorders: aboutgerd.org Summary  When you have GERD, food and lifestyle choices are very important in easing your symptoms.  Eat small meals often instead of 3 large meals a day. Eat your meals slowly and in a place where you are relaxed.  Avoid bending over or lying down until 2-3 hours after eating.  Limit high-fat foods such as fatty meats or fried foods. This information is not intended to replace advice given to you by your health care provider. Make sure you discuss any questions you have with your health care provider. Document Revised: 05/03/2020 Document Reviewed: 05/03/2020 Elsevier Patient Education  2021 Elsevier Inc. Preventive Care 26-36 Years Old, Female Preventive care refers to lifestyle choices and visits with your health care provider that can promote health and wellness. This includes:  A yearly physical exam. This is also called an annual wellness visit.  Regular dental and eye exams.  Immunizations.  Screening for certain conditions.  Healthy lifestyle choices, such as: ? Eating a healthy diet. ? Getting regular exercise. ? Not using drugs or products that contain nicotine and tobacco. ? Limiting alcohol use. What can I expect for my preventive care visit? Physical exam Your health care provider will check  your:  Height and weight. These may be used to calculate your BMI (body mass index). BMI is a measurement that tells if you are at a healthy weight.  Heart rate and blood pressure.  Body temperature.  Skin for abnormal spots. Counseling Your health care provider may ask you questions about your:  Past medical problems.  Family's medical history.  Alcohol, tobacco, and drug use.  Emotional well-being.  Home life and relationship well-being.  Sexual activity.  Diet, exercise, and sleep habits.  Work and work Statistician.  Access to firearms.  Method of birth control.  Menstrual cycle.  Pregnancy history. What immunizations do I need? Vaccines are usually given at various ages, according to a schedule. Your health care provider will recommend vaccines for you based on your age, medical history, and lifestyle or other factors, such as travel or where you work.   What tests do I need? Blood tests  Lipid and cholesterol levels. These may be checked every 5 years, or more often if you are over 19 years old.  Hepatitis C test.  Hepatitis B test. Screening  Lung cancer screening. You may have this screening every year starting at age 17 if you have  a 30-pack-year history of smoking and currently smoke or have quit within the past 15 years.  Colorectal cancer screening. ? All adults should have this screening starting at age 86 and continuing until age 61. ? Your health care provider may recommend screening at age 38 if you are at increased risk. ? You will have tests every 1-10 years, depending on your results and the type of screening test.  Diabetes screening. ? This is done by checking your blood sugar (glucose) after you have not eaten for a while (fasting). ? You may have this done every 1-3 years.  Mammogram. ? This may be done every 1-2 years. ? Talk with your health care provider about when you should start having regular mammograms. This may depend on  whether you have a family history of breast cancer.  BRCA-related cancer screening. This may be done if you have a family history of breast, ovarian, tubal, or peritoneal cancers.  Pelvic exam and Pap test. ? This may be done every 3 years starting at age 30. ? Starting at age 82, this may be done every 5 years if you have a Pap test in combination with an HPV test. Other tests  STD (sexually transmitted disease) testing, if you are at risk.  Bone density scan. This is done to screen for osteoporosis. You may have this scan if you are at high risk for osteoporosis. Talk with your health care provider about your test results, treatment options, and if necessary, the need for more tests. Follow these instructions at home: Eating and drinking  Eat a diet that includes fresh fruits and vegetables, whole grains, lean protein, and low-fat dairy products.  Take vitamin and mineral supplements as recommended by your health care provider.  Do not drink alcohol if: ? Your health care provider tells you not to drink. ? You are pregnant, may be pregnant, or are planning to become pregnant.  If you drink alcohol: ? Limit how much you have to 0-1 drink a day. ? Be aware of how much alcohol is in your drink. In the [place] one drink equals one 12 oz bottle of beer (355 mL), one 5 oz glass of wine (148 mL), or one 1 oz glass of hard liquor (44 mL).   Lifestyle  Take daily care of your teeth and gums. Brush your teeth every morning and night with fluoride toothpaste. Floss one time each day.  Stay active. Exercise for at least 30 minutes 5 or more days each week.  Do not use any products that contain nicotine or tobacco, such as cigarettes, e-cigarettes, and chewing tobacco. If you need help quitting, ask your health care provider.  Do not use drugs.  If you are sexually active, practice safe sex. Use a condom or other form of protection to prevent STIs (sexually transmitted infections).  If you  do not wish to become pregnant, use a form of birth control. If you plan to become pregnant, see your health care provider for a prepregnancy visit.  If told by your health care provider, take low-dose aspirin daily starting at age 74.  Find healthy ways to cope with stress, such as: ? Meditation, yoga, or listening to music. ? Journaling. ? Talking to a trusted person. ? Spending time with friends and family. Safety  Always wear your seat belt while driving or riding in a vehicle.  Do not drive: ? If you have been drinking alcohol. Do not ride with someone who has been drinking. ?  When you are tired or distracted. ? While texting.  Wear a helmet and other protective equipment during sports activities.  If you have firearms in your house, make sure you follow all gun safety procedures. What's next?  Visit your health care provider once a year for an annual wellness visit.  Ask your health care provider how often you should have your eyes and teeth checked.  Stay up to date on all vaccines. This information is not intended to replace advice given to you by your health care provider. Make sure you discuss any questions you have with your health care provider. Document Revised: 07/27/2020 Document Reviewed: 07/04/2018 Elsevier Patient Education  2021 [employer].

## 2021-01-24 NOTE — Assessment & Plan Note (Signed)
ghm utd Check labs  See avs  

## 2021-01-24 NOTE — Assessment & Plan Note (Signed)
Refer to sport med-- refill  diclofenac and muscle relaxer  Xray today

## 2021-01-24 NOTE — Progress Notes (Signed)
Patient ID: Rhonda Morrow, female    DOB: [birth year]  Age: 60 y.o. MRN: [id number]    Subjective:  Subjective  HPI Rhonda Morrow presents for an comprehensive physical exam today. She complains of gradual shoulder pain bilaterally(L>R) that has caused her sleep disturbance x 1 month. She notes that the pain oscillates between her left and right shoulders. She mentions that the symptoms worsen with sleeping. She describes it as a locking sensation on both of her shoulder when reaches her hands behind her back. She states that the pain has stopped her from cooking and cleaning at home. She was recommended by her sports medicine(01/18/21) and plastic surgery to attend physical therapy sessions to treat her shoulder pain. She has an appointment the afternoon, however she would like a opinion from her PCP before attending this appointment. She is requesting to focus on her shoulders first before she undergoes any breast surgery. She denies any trama to her shoulders. She denies any chest pain, SOB, fever, abdominal pain, cough, chills, sore throat, dysuria, urinary incontinence, back pain, HA, or N/VD at this time.   She has not been taking at home blood sugar levels due to being busy at home.      Review of Systems  Constitutional: Negative for chills, fatigue and fever.  HENT: Negative for congestion, ear pain, sinus pain and sore throat.   Eyes: Negative for pain.  Respiratory: Negative for cough and shortness of breath.   Cardiovascular: Negative for chest pain and palpitations.  Gastrointestinal: Negative for abdominal pain, blood in stool, diarrhea, nausea and vomiting.  Genitourinary: Negative for dysuria, frequency, hematuria and urgency.  Musculoskeletal: Negative for back pain and myalgias.       (+) bilateral shoulder pain(L>R)  Skin: Negative for rash.  Neurological: Negative for headaches.  Psychiatric/Behavioral: Positive for sleep disturbance (secondary to bilateral shoulder pain).     History Past Medical History:  Diagnosis Date  . Arthritis   . Asthma   . Complication of anesthesia    one time woke up and was vey scared,17 yrs ago  . Deaf   . Diabetes mellitus without complication ([place])   . GERD (gastroesophageal reflux disease)   . Hypertension   . Left groin pain   . Thyroid disease     She has a past surgical history that includes Cesarean section; Cardiac catheterization; Esophageal manometry (N/A, 09/29/2013); Cholecystectomy (N/A, 06/20/2016); and Total hip arthroplasty (Left, 02/21/2017).   Her family history includes Diabetes in her father and mother; Heart disease in her mother.She reports that she has never smoked. She has never used smokeless tobacco. She reports that she does not drink alcohol and does not use drugs.  Current Outpatient Medications on File Prior to Visit  Medication Sig Dispense Refill  . albuterol (VENTOLIN HFA) 108 (90 Base) MCG/ACT inhaler Inhale 1-2 puffs into the lungs every 6 (six) hours as needed for wheezing or shortness of breath. 8 g 6  . allopurinol (ZYLOPRIM) 100 MG tablet Take 1 tablet (100 mg total) by mouth daily. 90 tablet 3  . amLODipine (NORVASC) 5 MG tablet TAKE 1 TABLET BY MOUTH EVERY DAY 90 tablet 1  . aspirin EC 325 MG tablet Take 1 tablet (325 mg total) by mouth 2 (two) times daily. 84 tablet 0  . azelastine (ASTELIN) 0.1 % nasal spray Place 2 sprays into both nostrils 2 (two) times daily. Use in each nostril as directed 30 mL 3  . benzonatate (TESSALON) 200 MG capsule Take 1  capsule (200 mg total) by mouth 3 (three) times daily as needed for cough. 30 capsule 1  . blood glucose meter kit and supplies KIT Dispense based on patient and insurance preference. Use up to four times daily as directed. (FOR ICD-9 250.00, 250.01). 1 each 0  . Calcium Carbonate-Vit D-Min (CALCIUM 1200) 1200-1000 MG-UNIT CHEW 1 po qd 30 tablet 11  . cetirizine (ZYRTEC) 10 MG tablet TAKE 1 TABLET BY MOUTH EVERY DAY 90 tablet 1  .  colchicine 0.6 MG tablet Take 1 tablet (0.6 mg total) by mouth daily. 90 tablet 1  . Cyanocobalamin (B-12) 1000 MCG SUBL Dissolove 1 tablet under tongue once a day 90 tablet 1  . diclofenac sodium (VOLTAREN) 1 % GEL Apply 4 g topically 4 (four) times daily as needed. 500 g 6  . escitalopram (LEXAPRO) 10 MG tablet TAKE 1 TABLET BY MOUTH EVERYDAY AT BEDTIME 90 tablet 1  . Fiber CHEW Chew 1 tablet by mouth daily.    . fluticasone (FLONASE) 50 MCG/ACT nasal spray Place 2 sprays into both nostrils daily. 16 g 6  . fluticasone (FLONASE) 50 MCG/ACT nasal spray SPRAY 2 SPRAYS INTO EACH NOSTRIL EVERY DAY 48 mL 1  . glucose blood test strip Use as instructed--- one touch verio test strips 100 each 12  . hydrochlorothiazide (HYDRODIURIL) 12.5 MG tablet Take 1 tablet (12.5 mg total) by mouth daily. 90 tablet 1  . hydrOXYzine (ATARAX/VISTARIL) 10 MG tablet TAKE 1 TO 2 TABLETS BY MOUTH AT BEDTIME AS NEEDED FOR ITCHING 180 tablet 1  . ipratropium-albuterol (DUONEB) 0.5-2.5 (3) MG/3ML SOLN Take 3 mLs by nebulization every 6 (six) hours as needed. 360 mL 3  . Lancets (ONETOUCH ULTRASOFT) lancets Use as instructed 100 each 12  . meclizine (ANTIVERT) 12.5 MG tablet Take 1 tablet (12.5 mg total) by mouth 3 (three) times daily as needed for dizziness. 30 tablet 0  . metFORMIN (GLUCOPHAGE-XR) 500 MG 24 hr tablet TAKE 2 TABLET BY MOUTH EVERY DAY WITH BREAKFAST 60 tablet 1  . montelukast (SINGULAIR) 10 MG tablet Take 1 tablet (10 mg total) by mouth at bedtime. 90 tablet 3  . Multiple Vitamin (MULTIVITAMIN) tablet Take 1 tablet by mouth daily.    . NONFORMULARY OR COMPOUNDED ITEM Nebulizer   DX ASTHMA 1 each 0  . nystatin-triamcinolone (MYCOLOG II) cream Apply twice daily to lower chest area/upper abdomen 30 g 0  . ofloxacin (FLOXIN OTIC) 0.3 % OTIC solution Place 10 drops into the right ear daily. 10 mL 0  . omeprazole (PRILOSEC) 20 MG capsule Take 1 capsule (20 mg total) by mouth daily. 90 capsule 3  . ondansetron  (ZOFRAN ODT) 4 MG disintegrating tablet Take 1 tablet (4 mg total) by mouth every 8 (eight) hours as needed for nausea or vomiting. 20 tablet 0  . potassium chloride SA (KLOR-CON M20) 20 MEQ tablet Take 1 tablet (20 mEq total) by mouth daily. 90 tablet 1  . rOPINIRole (REQUIP) 0.25 MG tablet TAKE 1 TABLET BY MOUTH AT BEDTIME FOR 4 DAYS THEN INCREASE TO 2 TABLETS AT BEDTIME IF NEEDED 60 tablet 5  . rosuvastatin (CRESTOR) 10 MG tablet Take 1 tablet (10 mg total) by mouth daily. 30 tablet 2  . Spacer/Aero-Holding Chambers (AEROCHAMBER MV) inhaler Use as instructed 1 each 0  . SYMBICORT 160-4.5 MCG/ACT inhaler Inhale 2 puffs into the lungs every 12 (twelve) hours. 1 each 3  . traMADol (ULTRAM) 50 MG tablet TAKE 1 TABLET (50 MG TOTAL) BY MOUTH EVERY 8 (  EIGHT) HOURS AS NEEDED FOR UP TO 5 DAYS. 16 tablet 0  . triamcinolone cream (KENALOG) 0.1 % Apply bid to hands 30 g 0   No current facility-administered medications on file prior to visit.     Objective:  Objective  Physical Exam Vitals and nursing note reviewed. Exam conducted with a chaperone present.  Constitutional:      General: She is not in acute distress.    Appearance: Normal appearance. She is well-developed. She is not ill-appearing.  HENT:     Head: Normocephalic and atraumatic.     Right Ear: External ear normal.     Left Ear: External ear normal.     Nose: Nose normal.  Eyes:     Extraocular Movements: Extraocular movements intact.     Pupils: Pupils are equal, round, and reactive to light.  Cardiovascular:     Rate and Rhythm: Normal rate and regular rhythm.     Pulses: Normal pulses.     Heart sounds: Normal heart sounds. No murmur heard. No gallop.   Pulmonary:     Effort: Pulmonary effort is normal. No respiratory distress.     Breath sounds: Normal breath sounds. No wheezing or rales.  Chest:     Chest wall: No tenderness.  Abdominal:     General: Bowel sounds are normal.     Palpations: Abdomen is soft.      Tenderness: There is no abdominal tenderness. There is no guarding.     Hernia: No hernia is present.  Musculoskeletal:     Right shoulder: Tenderness present. Decreased range of motion.     Left shoulder: Tenderness present. Decreased range of motion.     Cervical back: Normal range of motion and neck supple.     Comments: Trapezius tenderness bilaterally  Bilateral pain with abduction and internal rotation  Skin:    General: Skin is warm and dry.  Neurological:     Mental Status: She is alert and oriented to person, place, and time.  Psychiatric:        Behavior: Behavior normal.    BP 110/62 (BP Location: Left Arm, Patient Position: Sitting, Cuff Size: Large)   Pulse 89   Temp 98.9 F (37.2 C) (Oral)   Resp 17   Ht 5' 4"  (1.626 m)   Wt 239 lb (108.4 kg)   LMP 05/20/2012   SpO2 98%   BMI 41.02 kg/m  Wt Readings from Last 3 Encounters:  01/24/21 239 lb (108.4 kg)  01/11/21 237 lb (107.5 kg)  12/28/20 238 lb 12.8 oz (108.3 kg)     Lab Results  Component Value Date   WBC 5.2 06/01/2020   HGB 13.2 06/01/2020   HCT 40.5 06/01/2020   PLT 168.0 06/01/2020   GLUCOSE 89 12/28/2020   CHOL 178 12/28/2020   TRIG 71.0 12/28/2020   HDL 62.60 12/28/2020   LDLCALC 101 (H) 12/28/2020   ALT 24 12/28/2020   AST 23 12/28/2020   NA 141 12/28/2020   K 4.0 12/28/2020   CL 103 12/28/2020   CREATININE 0.84 12/28/2020   BUN 13 12/28/2020   CO2 32 12/28/2020   TSH 0.74 04/20/2020   INR 1.05 02/12/2017   HGBA1C 7.2 (H) 12/28/2020   MICROALBUR 0.5 07/22/2020    MM 3D SCREEN BREAST BILATERAL  Result Date: 09/30/2020 CLINICAL DATA:  Screening. EXAM: DIGITAL SCREENING BILATERAL MAMMOGRAM WITH TOMO AND CAD COMPARISON:  Previous exam(s). ACR Breast Density Category a: The breast tissue is almost entirely fatty. FINDINGS:  There are no findings suspicious for malignancy. Images were processed with CAD. IMPRESSION: No mammographic evidence of malignancy. A result letter of this screening  mammogram will be mailed directly to the patient. RECOMMENDATION: Screening mammogram in one year. (Code:[id number]-B-01Y) BI-RADS CATEGORY  1: Negative. Electronically Signed   By: Lajean Manes M.D.   On: 09/30/2020 09:12     Assessment & Plan:  Plan    Meds ordered this encounter  Medications  . metaxalone (SKELAXIN) 800 MG tablet    Sig: Take 1 tablet (800 mg total) by mouth 3 (three) times daily.    Dispense:  30 tablet    Refill:  2  . diclofenac (VOLTAREN) 75 MG EC tablet    Sig: Take 1 tablet (75 mg total) by mouth 2 (two) times daily as needed.    Dispense:  60 tablet    Refill:  3  . omeprazole (PRILOSEC) 20 MG capsule    Sig: Take 1 capsule (20 mg total) by mouth daily.    Dispense:  30 capsule    Refill:  3    Problem List Items Addressed This Visit      Unprioritized   Acute pain of left shoulder - Primary    Pt has Pt today Refill muscle relaxer and diclofenac Refer to sport med       Relevant Orders   DG Shoulder Left   Ambulatory referral to Sports Medicine   Acute pain of right shoulder    Refer to sport med-- refill  diclofenac and muscle relaxer  Xray today      Relevant Orders   DG Shoulder Right   Ambulatory referral to Sports Medicine   Diabetes mellitus, type II ([place])   Relevant Orders   Ambulatory referral to Ophthalmology   Dyspepsia   Relevant Medications   omeprazole (PRILOSEC) 20 MG capsule   Foot pain, bilateral   Relevant Medications   diclofenac (VOLTAREN) 75 MG EC tablet   Knee pain, right   Relevant Medications   metaxalone (SKELAXIN) 800 MG tablet   Preventative health care    ghm utd Check labs  See avs       Other Visit Diagnoses    Estrogen deficiency       Relevant Orders   DG Bone Density   History of left hip replacement       Relevant Medications   metaxalone (SKELAXIN) 800 MG tablet     Mammogram- Last completed on 09/30/2020, results are normal, repeat in 1 year.   Pap smear- Last completed on 03/03/2020,  Caida Vaginitis noted, repeat every 3 years.   Colonoscopy-  Last completed on 09/17/2013, polyp was noted, repeat in 10 years.   Dexa- Last completed on 11/23/2015, results are normal. Repeat in 2 years  Follow-up: Return in about 6 months (around 07/27/2021), or if symptoms worsen or fail to improve, for hypertension, hyperlipidemia, diabetes II---  will need lab visit only end of may beg of june.   I,Gordon Zheng,acting as a Education administrator for [employer], DO.,have documented all relevant documentation on the behalf of Ann Held, DO,as directed by  Ann Held, DO while in the presence of [place], DO, have reviewed all documentation for this visit. The documentation on 01/24/21 for the exam, diagnosis, procedures, and orders are all accurate and complete.

## 2021-01-24 NOTE — Assessment & Plan Note (Signed)
Pt has Pt today Refill muscle relaxer and diclofenac Refer to sport med

## 2021-01-25 ENCOUNTER — Telehealth: Payer: Self-pay | Admitting: Family Medicine

## 2021-01-25 NOTE — Telephone Encounter (Signed)
Patient states that the pharmacy said that  there is a conflict with the medicine doctor needs to review  Medication:metaxalone (SKELAXIN) 800 MG tablet [[id number]]

## 2021-01-26 NOTE — Telephone Encounter (Signed)
Its just ;prn

## 2021-01-26 NOTE — Telephone Encounter (Signed)
Spoke with pharmacy and they stated that that they had a drug interaction for the Skelaxin and Lexapro.  It can cause serotonin syndrome and they wanted to know are you aware of this?   In review of her chart she has never taken these two together.  Also was not sure if she was just taking the Skelaxin prn.

## 2021-01-27 ENCOUNTER — Other Ambulatory Visit: Payer: Self-pay

## 2021-01-27 ENCOUNTER — Ambulatory Visit: Payer: Federal, State, Local not specified - PPO | Admitting: Family Medicine

## 2021-01-27 ENCOUNTER — Ambulatory Visit: Payer: Self-pay

## 2021-01-27 VITALS — HR 79 | Ht 64.0 in | Wt 240.2 lb

## 2021-01-27 DIAGNOSIS — M25512 Pain in left shoulder: Secondary | ICD-10-CM | POA: Diagnosis not present

## 2021-01-27 DIAGNOSIS — M25511 Pain in right shoulder: Secondary | ICD-10-CM | POA: Diagnosis not present

## 2021-01-27 DIAGNOSIS — G8929 Other chronic pain: Secondary | ICD-10-CM

## 2021-01-27 NOTE — Patient Instructions (Addendum)
Thank you for coming in today.  Plan for physical therapy.   Recheck with me in 6 weeks. Schedule with ASL interpreter.   Let me know if you have a problem.

## 2021-01-27 NOTE — Progress Notes (Signed)
I, Peterson Lombard, LAT, ATC acting as a scribe for Lynne Leader, MD.  Subjective:    I'm seeing this patient as a consultation for Dr. [name]. Note will be routed back to referring provider/PCP.  CC: Bilateral shoulder pain  HPI: Pt is a 60 y/o female c/o bilat shoulder pain, L>R ongoing for years, worsening over the last month. Pt was previously been seen by Dr. Karlton Lemon in 2018. 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 Pt is R-hand dominate. Today, pt locates pain to the anterior aspect of shoulders. Pt states that her pain rotates between R and L shoulders. Pt notes shoulder are painful at night and wakes her from sleep.  Neck pain: no Radiates: no Shoulder mechanical symptoms: yes- describe pain as a "locking" sensation UE Numbness/tingling: slight UE Weakness: no Aggravates: trying to sleep Treatments tried: meloxicam- is planning on picking it up today  Dx imaging: R and L shoulder XR- 01/24/21  Past medical history, Surgical history, Family history, Social history, Allergies, and medications have been entered into the medical record, reviewed.   Review of Systems: No new headache, visual changes, nausea, vomiting, diarrhea, constipation, dizziness, abdominal pain, skin rash, fevers, chills, night sweats, weight loss, swollen lymph nodes, body aches, joint swelling, muscle aches, chest pain, shortness of breath, mood changes, visual or auditory hallucinations.   Objective:    Vitals:   01/27/21 1419  Pulse: 79  SpO2: 98%   General: Well Developed, well nourished, and in no acute distress.  Neuro/Psych: Alert and oriented x3, extra-ocular muscles intact, able to move all 4 extremities, sensation grossly intact. Skin: Warm and dry, no rashes noted.  Respiratory: Not using accessory muscles, speaking in full sentences, trachea midline.  Cardiovascular: Pulses palpable, no extremity edema. Abdomen: Does not appear  distended. MSK: Left shoulder normal-appearing Nontender. Range of motion abduction 90 degrees external rotation full internal rotation posterior iliac crest. Strength 4/5 abduction 4/5 external rotation 5/5 internal rotation. Positive Hawkins and Neer's test positive empty can test negative Yergason's and speeds test.  Right shoulder normal-appearing Nontender. Range of motion full abduction external rotation full internal rotation lumbar spine. Strength intact. Positive Hawkins and Neer's test.  Positive empty can test.  Negative Yergason's and speeds test.  Lab and Radiology Results No results found for this or any previous visit (from the past 72 hour(s)). DG Shoulder Right  Result Date: 01/25/2021 CLINICAL DATA:  Pain EXAM: RIGHT SHOULDER - 2+ VIEW COMPARISON:  November 08, 2018 FINDINGS: Oblique, Y scapular, and axillary views obtained. No fracture or dislocation. There is moderate generalized joint space narrowing. No erosive change. Focal calcification noted immediately lateral to the acromion, stable. Visualized right lung clear. IMPRESSION: Moderate generalized osteoarthritic change. Suspect calcific tendinosis immediately lateral to the acromion. No fracture or dislocation. No erosion. Electronically Signed   By: Lowella Grip III M.D.   On: 01/25/2021 10:09   DG Shoulder Left  Result Date: 01/25/2021 CLINICAL DATA:  60 year old female with left shoulder pain. EXAM: LEFT SHOULDER - 2+ VIEW COMPARISON:  None. FINDINGS: There is no evidence of fracture or dislocation. There is no evidence of arthropathy or other focal bone abnormality. Soft tissues are unremarkable. IMPRESSION: No acute fracture or malalignment. Electronically Signed   By: Ruthann Cancer MD   On: 01/25/2021 09:13  I, Lynne Leader, personally (independently) visualized and performed the interpretation of the images attached in this note.  Diagnostic Limited MSK Ultrasound of: Left  shoulder Biceps tendon intact  normal. Subscapularis tendon is intact. Supraspinatus tendon is difficult to visualize.  Possible tear. Infraspinatus tendon is intact but also difficult to visualize. AC joint degenerative. Impression: Concern for supraspinatus tear.  Diagnostic Limited MSK Ultrasound of: Right shoulder Biceps tendon intact normal-appearing Subscapularis tendon is intact Supraspinatus is intact with moderate subacromial bursitis. Infraspinatus is intact. AC joint degenerative. Impression: Subacromial bursitis and AC DJD.     Impression and Recommendations:    Assessment and Plan: 60 y.o. female with bilateral shoulder pain left worse than right.  This is an ongoing chronic issue.  Based on physical exam ultrasound concern for supraspinatus tear left shoulder.  Patient also has concern for subacromial bursitis of the right shoulder.  Discussed options.  Plan for trial of physical therapy and recheck in 6 weeks.  If not improved would consider MRI or injection at that point..  Patient can certainly inform me via MyChart in the interim if she is worsening and may consider MRI sooner than that.  Of note today's visit was conducted using the American sign language interpreter given her deafness.  PDMP not reviewed this encounter. Orders Placed This Encounter  Procedures  . [place] LIMITED JOINT SPACE STRUCTURES UP BILAT(NO LINKED CHARGES)    Standing Status:   Future    Number of Occurrences:   1    Standing Expiration Date:   07/30/2021    Order Specific Question:   Reason for Exam (SYMPTOM  OR DIAGNOSIS REQUIRED)    Answer:   chronic bilateral shoulder pain    Order Specific Question:   Preferred imaging location?    Answer:   [place]   No orders of the defined types were placed in this encounter.   Discussed warning signs or symptoms. Please see discharge instructions. Patient expresses understanding.   The above documentation has been reviewed and is accurate and  complete Lynne Leader, M.D.

## 2021-01-27 NOTE — Telephone Encounter (Signed)
Patient notified that we fixed the issue at the pharmacy and they are getting it ready.

## 2021-01-28 DIAGNOSIS — J209 Acute bronchitis, unspecified: Secondary | ICD-10-CM | POA: Diagnosis not present

## 2021-01-31 ENCOUNTER — Telehealth: Payer: Self-pay | Admitting: Family Medicine

## 2021-01-31 ENCOUNTER — Ambulatory Visit: Payer: Federal, State, Local not specified - PPO | Attending: Plastic Surgery

## 2021-01-31 ENCOUNTER — Telehealth: Payer: Self-pay | Admitting: *Deleted

## 2021-01-31 ENCOUNTER — Other Ambulatory Visit: Payer: Self-pay

## 2021-01-31 DIAGNOSIS — M25511 Pain in right shoulder: Secondary | ICD-10-CM | POA: Insufficient documentation

## 2021-01-31 DIAGNOSIS — M25612 Stiffness of left shoulder, not elsewhere classified: Secondary | ICD-10-CM | POA: Diagnosis not present

## 2021-01-31 DIAGNOSIS — G8929 Other chronic pain: Secondary | ICD-10-CM | POA: Diagnosis not present

## 2021-01-31 DIAGNOSIS — M25611 Stiffness of right shoulder, not elsewhere classified: Secondary | ICD-10-CM | POA: Diagnosis not present

## 2021-01-31 DIAGNOSIS — R293 Abnormal posture: Secondary | ICD-10-CM | POA: Diagnosis not present

## 2021-01-31 DIAGNOSIS — M545 Low back pain, unspecified: Secondary | ICD-10-CM | POA: Insufficient documentation

## 2021-01-31 DIAGNOSIS — M6281 Muscle weakness (generalized): Secondary | ICD-10-CM | POA: Insufficient documentation

## 2021-01-31 DIAGNOSIS — M25512 Pain in left shoulder: Secondary | ICD-10-CM | POA: Diagnosis not present

## 2021-01-31 NOTE — Therapy (Signed)
[place]. [place], [place], [address] Phone: [phone]   Fax:  [phone]  Physical Therapy Evaluation  Patient Details  Name: Rhonda [place]row MRN: [id number] Date of Birth: 04-Mar-1961 Referring Provider (PT): Marla Roe Loel Lofty, DO   Encounter Date: 01/31/2021   PT End of Session - 01/31/21 1534    Visit Number 1    Number of Visits 9    Date for PT Re-Evaluation 03/28/21    Authorization Type BCBS    PT Start Time 1455    PT Stop Time 1530    PT Time Calculation (min) 35 min    Activity Tolerance Patient tolerated treatment well    Behavior During Therapy [facility] for tasks assessed/performed           Past Medical History:  Diagnosis Date  . Arthritis   . Asthma   . Complication of anesthesia    one time woke up and was vey scared,17 yrs ago  . Deaf   . Diabetes mellitus without complication ([place])   . GERD (gastroesophageal reflux disease)   . Hypertension   . Left groin pain   . Thyroid disease     Past Surgical History:  Procedure Laterality Date  . CARDIAC CATHETERIZATION    . CESAREAN SECTION    . CHOLECYSTECTOMY N/A 06/20/2016   Procedure: LAPAROSCOPIC CHOLECYSTECTOMY;  Surgeon: Rolm Bookbinder, MD;  Location: [facility];  Service: General;  Laterality: N/A;  . ESOPHAGEAL MANOMETRY N/A 09/29/2013   Procedure: ESOPHAGEAL MANOMETRY (EM);  Surgeon: Milus Banister, MD;  Location: [facility];  Service: Endoscopy;  Laterality: N/A;  . TOTAL HIP ARTHROPLASTY Left 02/21/2017   Procedure: LEFT TOTAL HIP ARTHROPLASTY ANTERIOR APPROACH;  Surgeon: Leandrew Koyanagi, MD;  Location: [facility];  Service: Orthopedics;  Laterality: Left;    There were no vitals filed for this visit.    Subjective Assessment - 01/31/21 1500    Subjective Shoulder pain worsened about 3 weeks ago. Bilateral shoulder pain that worsens at night, feels like a pulling sensation. Also having some neck pain and back pain. Neck and back is  throbbing. Recently got [place] at Dr [employer] office. Also has recently been very stressed because alot of things are going on, husband in hospital, lots of appointments at this time.    Patient is accompained by: Interpreter   Juliann Pulse   Pertinent History Deaf, left THA, asthma, DM, arthritis, HTN, gout right foot    Diagnostic tests Diagnostic US: concern for supraspinatus tear left shoulder.  Patient also has concern for subacromial bursitis of the right shoulder.    Patient Stated Goals to get rid of pain , get back to PLOF    Currently in Pain? Yes    Pain Score 5     Pain Location Neck   B shoulder   Pain Orientation Right;Left    Pain Descriptors / Indicators Throbbing;Sharp    Pain Frequency Constant    Aggravating Factors  lifting    Pain Relieving Factors Metaloxone pain medication 800 mg.              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  rounded/protracted shoulders forward head, mildly kyphotic. Some scpaular winging, scapular ABD bilateral     ROM / Strength   AROM / PROM / Strength AROM;Strength;PROM      AROM   Overall AROM Comments Bil cerv rotation and SB decreased 50%, no pain reported   some rotation pain but better with medicine.   Right/Left Shoulder --   Flexion: 129 L, 126 R. Abd. 114 B.  Functional ER B to back of head + tightness. FIR T12 L and L3 R.     Strength    Strength Assessment Site Hip;Knee    Right/Left Shoulder --   B ABD 3+/5 + pain.B elbows and wrists grossly 4+/5.   Right/Left Hip --   B hip flex 4-/5 + LBP   Right/Left Knee --   B 4-/5     Flexibility   Soft Tissue Assessment /Muscle Length yes   B pectoralis tightness     Palpation   Palpation comment TTP UP and B anterior shoulder, biceps                        Objective measurements completed on examination: See above findings.               PT Education - 01/31/21 1533    Education Details PT POC and initial HEP. Access Code: [email or web address]    Person(s) Educated Patient;Other (comment)   Interpretor   Methods Explanation;Demonstration;Handout    Comprehension Verbalized understanding;Returned demonstration;Need further instruction            PT Short Term Goals - 01/31/21 1601      PT SHORT TERM GOAL #1   Title Independent with initial HEP    Time 2    Period Weeks    Status New    Target Date 02/14/21      PT SHORT TERM GOAL #2   Title Pt will demo good understanding for body mechanics with ADLs, lifting    Time 4    Period Weeks    Status New    Target Date 02/28/21             PT Long Term Goals - 01/31/21 1538      PT LONG TERM GOAL #1   Title decreased pain in bilateral shoulders with ADLS by 78% or more to normalize ADLS.    Time 8    Period Weeks    Status New    Target Date 03/28/21      PT LONG TERM GOAL #2   Title Improved B shoulder ROM to [facility] with </= 2/10 pain to facilitate ability to reach behind back/neck for grooming and dressing as well as household chores    Baseline Very limited and painful functional IR and ER    Time 8    Period Weeks    Status New    Target Date 03/28/21      PT LONG TERM GOAL #3   Title Pt will be able to lift various weighted objects with good body mechanics and </= 2/10 pain to facilitate painfree work/home activities.    Time  8    Period Weeks    Status New    Target Date  03/28/21      PT LONG TERM GOAL #4   Title Pt able to sleep without waking from pain    Time 8    Period Weeks    Status New    Target Date 03/28/21                  Plan - 01/31/21 1534    Clinical Impression Statement Pt is a 60 yo female who presents with chronic B shoulder pain (R>L), neck pain and LBP. She had recent diagnostic [place] at Dr [facility] office  which showed concern for supraspinatus tear left shoulder and subacromial bursitis of the right shoulder. Kalya demonstrates decreased shoulder ROM bilaterally with neck muscle and chest muscle tightness, decreased postural ms and LE strength, painful cervical ROM especially with rotaiton, and alot of muscle guarding and tenderness in the neck mms. She reports pain not as bad at time of evaluation due to the prescription she is taking.  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 She will benefit from skilled physical therapy to work on improving overall strength (UE, LE and core), B shoulder ROM, body mechanics, and flexibility to facilitate improved functional mobility for home and work activities.    Personal Factors and Comorbidities Comorbidity 3+    Comorbidities DM, OSA, Hyperlipidemia, Restless leg syndrome, gout, HTN, Asthma, left THA, Deaf, Arthitis    Stability/Clinical Decision Making Evolving/Moderate complexity    Clinical Decision Making Low    Rehab Potential Good    PT Frequency 1x / week    PT Duration 8 weeks    PT Treatment/Interventions ADLs/Self Care Home Management;Cryotherapy;Electrical Stimulation;Iontophoresis 4mg /ml Dexamethasone;Moist Heat;Therapeutic exercise;Neuromuscular re-education;Patient/family education;Manual techniques;Dry needling;Taping;Functional mobility training;Therapeutic activities;Energy conservation    PT Next Visit Plan Shoulder  FOTO. Reassess HEP. Progress B Shoulder Strength and ROM within pain limited ranges, anterior chest/shoulder and neck ms flexiblity, core/LE flexibility. Educate regarding postural strengthening and body mechanics. Manual and modalities as needed for pain and ms extensibility.    Consulted and Agree with Plan of Care Patient   Interpretor          Patient will benefit from skilled therapeutic intervention in order to improve the following deficits and impairments:  Pain,Decreased range of motion,Decreased strength,Impaired UE functional use,Decreased mobility,Postural dysfunction,Improper body mechanics,Impaired flexibility  Visit Diagnosis: Acute pain of right shoulder - Plan: PT plan of care cert/re-cert  Stiffness of right shoulder, not elsewhere classified - Plan: PT plan of care cert/re-cert  Chronic left shoulder pain - Plan: PT plan of care cert/re-cert  Stiffness of left shoulder, not elsewhere classified - Plan: PT plan of care cert/re-cert  Muscle weakness (generalized) - Plan: PT plan of care cert/re-cert  Abnormal posture - Plan: PT plan of care cert/re-cert  Chronic low back pain, unspecified back pain laterality, unspecified whether sciatica present - Plan: PT plan of care cert/re-cert     Problem List Patient Active Problem List   Diagnosis Date Noted  . Acute pain of left shoulder 01/24/2021  . Acute pain of right shoulder 01/24/2021  . Foot pain, bilateral 01/24/2021  . Dyspepsia 01/24/2021  . Preventative health care 01/24/2021  . Hand pain, right 12/29/2020  . OSA (obstructive sleep apnea) 09/12/2020  . Nocturia more than  twice per night 08/03/2020  . Non-restorative sleep 08/03/2020  . Leg cramping 08/03/2020  . Hyperlipidemia associated with type 2 diabetes mellitus ([place]) 07/25/2020  . Large breasts 07/22/2020  . RLS (restless legs syndrome) 05/17/2020  . Cough 04/01/2020  . Moderate persistent asthma without complication 81/08/3158  . Medication  management 04/01/2020  . Menopausal symptoms 03/29/2020  . Hot flashes 03/29/2020  . Chronic right shoulder pain 08/11/2019  . Acute non-recurrent maxillary sinusitis 08/11/2019  . BMI 40.0-44.9, adult ([place]) 01/22/2019  . Right lower quadrant abdominal pain 10/20/2018  . Rectal pain 10/20/2018  . Vitamin B 12 deficiency 03/28/2018  . B12 deficiency 03/28/2018  . DM (diabetes mellitus) type II uncontrolled, periph vascular disorder ([place]) 03/28/2018  . Hyperlipidemia 03/28/2018  . Primary osteoarthritis of left hip 02/21/2017  . 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 History of laparoscopic cholecystectomy 06/20/2016  . Cerumen impaction 06/09/2016  . Hypersomnia 05/16/2016  . Knee pain, right 05/05/2016  . RUQ pain 04/09/2016  . Abnormal CT scan 03/28/2016  . Dyspnea and respiratory abnormalities 03/15/2016  . Asthma with acute exacerbation 03/15/2016  . Asthma in adult 02/25/2016  . DM (diabetes mellitus) type II controlled, neurological manifestation ([place]) 02/09/2016  . Left hip pain 12/23/2015  . Right hamstring muscle strain 11/18/2015  . Abdominal pain, acute 09/01/2015  . Acute asthma exacerbation 04/16/2015  . Acute bronchitis 04/14/2015  . Pap smear for cervical cancer screening 09/14/2014  . Bed bug bite 05/06/2014  . Allergic rhinitis 04/09/2014  . Rectal itching 04/09/2014  . Bladder spasm 04/09/2014  . Knee pain, left 03/05/2014  . Hypokalemia 02/19/2014  . Nausea with vomiting 02/19/2014  . Diabetes mellitus, type II ([place]) 02/19/2014  . Edema 02/16/2014  . Disorder of rotator cuff 11/12/2013  . Routine general medical examination at a health care facility 08/20/2013  . GERD (gastroesophageal reflux disease) 06/26/2013  . Dysphagia, pharyngoesophageal phase 06/26/2013  . Medication side effect 03/25/2013  . Diarrhea 03/25/2013  . Benign positional vertigo 01/30/2013  . Traumatic hematoma of thigh 01/15/2013  . HTN (hypertension) 09/02/2012  .  Dry skin 08/22/2012  . External hemorrhoids 08/22/2012  . Obesity 06/30/2012  . Thyromegaly 05/22/2012  . Back pain 05/22/2012  . Elevated glucose 05/22/2012  . Moderate persistent asthma 04/19/2012  . Dyspnea 01/28/2012    Hall Busing, PT, DPT 01/31/2021, 4:03 PM  [place]. [place], [place], [address] Phone: [phone]   Fax:  [phone]  Name: GLENNIS BORGER MRN: [id number] Date of Birth: [birth year]

## 2021-01-31 NOTE — Telephone Encounter (Signed)
Patient would like a call back at [phone] please LVM she is coughing and having yellow phlegm and eye are feeling sticky when she wake up.  Please advice

## 2021-01-31 NOTE — Telephone Encounter (Signed)
Patient would like a phone call right after lunch. She has an appt today at 0330 with someone else she is afraid she might missed your call.

## 2021-01-31 NOTE — Telephone Encounter (Signed)
Patient called this morning.  See other phone message for today.  Who Is Calling Patient / Member / Family / Caregiver Call Type Triage / Clinical Relationship To Patient Self Return Phone Number [phone] (Primary) Chief Complaint Eye Pus Or Discharge Reason for Call Request to Schedule Office Appointment Initial Comment Deaf caller states she is trying to schedule an appt for tomorrow. She states her right eye feels cold and it's draining. Additional Comment Sign language interpreter was on the line. She says call a couple of times because sometimes they won't see the light Translation No Nurse Assessment Nurse: Rhonda Cahill, RN, Santiago Glad Date/Time [name] Time): 01/30/2021 11:41:00 AM Confirm and document reason for call. If symptomatic, describe symptoms. ---Deaf caller states she is trying to schedule an appt for tomorrow. She states her right eye feels cold and it's draining. Her mother says it's a cold in her eye. Eye feels achy. Has a cough, spitting up mucus. Went to UC last Friday for cough, was given something. Woke up last two days with eyelids clumped together. Was prescribed doxycycline. Vomited on Friday. Eye was red. Now it is getting worse. Does the patient have any new or worsening symptoms? ---Yes Will a triage be completed? ---Yes Related visit to physician within the last 2 weeks? ---Yes Does the PT have any chronic conditions? (i.e. diabetes, asthma, this includes High risk factors for pregnancy, etc.) ---Yes List chronic conditions. ---hearing impaired, diabetes, asthma Is this a behavioral health or substance abuse call? ---No Guidelines Guideline Title Affirmed Question Affirmed Notes Nurse Date/Time (Eastern Time) Eye - Pus or Discharge [1] Eyelid (outer) is very red (or tender to touch) AND [2] fever [name] 01/30/2021 11:46:56 AM Disp. Time [name] Time) Disposition Final User

## 2021-01-31 NOTE — Patient Instructions (Signed)
Access Code: [email or web address] URL: [email or web address] Date: 01/31/2021 Prepared by: Sherlynn Stalls  Exercises Doorway Pec Stretch at 90 Degrees Abduction - 1 x daily - 7 x weekly - 4 sets - 30 seconds hold Doorway Pec Stretch at 60 Elevation - 1 x daily - 7 x weekly - 4 sets - 30 seconds hold Seated March - 1 x daily - 7 x weekly - 2 sets - 10 reps Seated Gentle Upper Trapezius Stretch - 1 x daily - 7 x weekly - 4 sets - 30 seconds hold Gentle Levator Scapulae Stretch - 1 x daily - 7 x weekly - 4 sets - 30 seconds hold Seated Scapular Retraction - 1 x daily - 7 x weekly - 2 sets - 10 reps

## 2021-02-02 NOTE — Telephone Encounter (Signed)
Pt can come in for visit with a negative COVID test

## 2021-02-07 ENCOUNTER — Ambulatory Visit: Payer: Federal, State, Local not specified - PPO

## 2021-02-08 ENCOUNTER — Encounter: Payer: Self-pay | Admitting: Family Medicine

## 2021-02-08 ENCOUNTER — Telehealth: Payer: Self-pay

## 2021-02-08 ENCOUNTER — Ambulatory Visit (HOSPITAL_BASED_OUTPATIENT_CLINIC_OR_DEPARTMENT_OTHER)
Admission: RE | Admit: 2021-02-08 | Discharge: 2021-02-08 | Disposition: A | Discharge status: Home / Self Care | Payer: Federal, State, Local not specified - PPO | Source: Ambulatory Visit | Attending: Family Medicine | Admitting: Family Medicine

## 2021-02-08 ENCOUNTER — Ambulatory Visit: Payer: Federal, State, Local not specified - PPO | Admitting: Family Medicine

## 2021-02-08 ENCOUNTER — Other Ambulatory Visit: Payer: Self-pay

## 2021-02-08 VITALS — BP 120/80 | HR 85 | Temp 98.1°F | Resp 18 | Ht 64.0 in | Wt 234.4 lb

## 2021-02-08 DIAGNOSIS — J40 Bronchitis, not specified as acute or chronic: Secondary | ICD-10-CM

## 2021-02-08 DIAGNOSIS — R059 Cough, unspecified: Secondary | ICD-10-CM | POA: Diagnosis not present

## 2021-02-08 DIAGNOSIS — R0602 Shortness of breath: Secondary | ICD-10-CM | POA: Diagnosis not present

## 2021-02-08 MED ORDER — AZITHROMYCIN 250 MG PO TABS: ORAL_TABLET | ORAL | 0 refills | Status: DC

## 2021-02-08 MED ORDER — PROMETHAZINE-DM 6.25-15 MG/5ML PO SYRP: 5.0000 mL | ORAL_SOLUTION | Freq: Four times a day (QID) | ORAL | 0 refills | Status: DC | PRN

## 2021-02-08 NOTE — Telephone Encounter (Signed)
FYI: Pt will come by to sign.

## 2021-02-08 NOTE — Progress Notes (Signed)
Subjective:    Patient ID: Rhonda Morrow, female    DOB: 07-19-1961, 60 y.o.   MRN: [id number]  Chief Complaint  Patient presents with  . Cough    X2 weeks of bad  productive cough, Pt reports  [facility] Pt states Tessalon not helping.     HPI Patient is in today for 2 weeks of cough, congestion and cough is productive .  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 Arthritis   . Asthma   . Complication of anesthesia    one time woke up and was vey scared,17 yrs ago  . Deaf   . Diabetes mellitus without complication ([place])   . GERD (gastroesophageal reflux disease)   . Hypertension   . Left groin pain   . Thyroid disease     Past Surgical History:  Procedure Laterality Date  . CARDIAC CATHETERIZATION    . CESAREAN SECTION    . CHOLECYSTECTOMY N/A 06/20/2016   Procedure: LAPAROSCOPIC CHOLECYSTECTOMY;  Surgeon: Rolm Bookbinder, MD;  Location: [place];  Service: General;  Laterality: N/A;  . ESOPHAGEAL MANOMETRY N/A 09/29/2013   Procedure: ESOPHAGEAL MANOMETRY (EM);  Surgeon: Milus Banister, MD;  Location: [facility];  Service: Endoscopy;  Laterality: N/A;  . TOTAL HIP ARTHROPLASTY Left 02/21/2017   Procedure: LEFT TOTAL HIP ARTHROPLASTY ANTERIOR APPROACH;  Surgeon: Leandrew Koyanagi, MD;  Location: [facility];  Service: Orthopedics;  Laterality: Left;    Family History  Problem Relation Age of Onset  . Diabetes Mother   . Heart disease Mother   . Diabetes Father     Social History   Socioeconomic History  . Marital status: Married    Spouse name: Not on file  . Number of children: Not on file  . Years of education: Not on file  . Highest education level: Not on file  Occupational History  . Occupation: disabled  Tobacco Use  . Smoking status: Never Smoker  . Smokeless tobacco: Never Used  Vaping Use  . Vaping Use: Never used  Substance and Sexual Activity  . Alcohol use: No     Alcohol/week: 0.0 standard drinks  . Drug use: No  . Sexual activity: Never  Other Topics Concern  . Not on file  Social History Narrative   Lives with family.  Has 3 children.  Works for [employer].  Education: high school.   Social Determinants of Health   Financial Resource Strain: Not on file  Food Insecurity: Not on file  Transportation Needs: Not on file  Physical Activity: Not on file  Stress: Not on file  Social Connections: Not on file  Intimate Partner Violence: Not on file    Outpatient Medications Prior to Visit  Medication Sig Dispense Refill  . albuterol (VENTOLIN HFA) 108 (90 Base) MCG/ACT inhaler Inhale 1-2 puffs into the lungs every 6 (six) hours as needed for wheezing or shortness of breath. 8 g 6  . allopurinol (ZYLOPRIM) 100 MG tablet Take 1 tablet (100 mg total) by mouth daily. 90 tablet 3  . amLODipine (NORVASC) 5 MG tablet TAKE 1 TABLET BY MOUTH EVERY DAY 90 tablet 1  . aspirin EC 325 MG tablet Take 1 tablet (325 mg total) by mouth 2 (two) times daily. 84 tablet 0  . azelastine (ASTELIN) 0.1 % nasal spray Place 2 sprays into both nostrils 2 (two) times daily. Use in  each nostril as directed 30 mL 3  . benzonatate (TESSALON) 200 MG capsule Take 1 capsule (200 mg total) by mouth 3 (three) times daily as needed for cough. 30 capsule 1  . blood glucose meter kit and supplies KIT Dispense based on patient and insurance preference. Use up to four times daily as directed. (FOR ICD-9 250.00, 250.01). 1 each 0  . Calcium Carbonate-Vit D-Min (CALCIUM 1200) 1200-1000 MG-UNIT CHEW 1 po qd 30 tablet 11  . cetirizine (ZYRTEC) 10 MG tablet TAKE 1 TABLET BY MOUTH EVERY DAY 90 tablet 1  . colchicine 0.6 MG tablet Take 1 tablet (0.6 mg total) by mouth daily. 90 tablet 1  . COVID-19 mRNA vaccine, Pfizer, 30 MCG/0.3ML injection INJECT AS DIRECTED .3 mL 0  . Cyanocobalamin (B-12) 1000 MCG SUBL Dissolove 1 tablet under tongue once a day 90 tablet 1  . diclofenac (VOLTAREN) 75 MG EC  tablet Take 1 tablet (75 mg total) by mouth 2 (two) times daily as needed. 60 tablet 3  . diclofenac sodium (VOLTAREN) 1 % GEL Apply 4 g topically 4 (four) times daily as needed. 500 g 6  . escitalopram (LEXAPRO) 10 MG tablet TAKE 1 TABLET BY MOUTH EVERYDAY AT BEDTIME 90 tablet 1  . Fiber CHEW Chew 1 tablet by mouth daily.    . fluticasone (FLONASE) 50 MCG/ACT nasal spray Place 2 sprays into both nostrils daily. 16 g 6  . fluticasone (FLONASE) 50 MCG/ACT nasal spray SPRAY 2 SPRAYS INTO EACH NOSTRIL EVERY DAY 48 mL 1  . glucose blood test strip Use as instructed--- one touch verio test strips 100 each 12  . hydrochlorothiazide (HYDRODIURIL) 12.5 MG tablet Take 1 tablet (12.5 mg total) by mouth daily. 90 tablet 1  . hydrOXYzine (ATARAX/VISTARIL) 10 MG tablet TAKE 1 TO 2 TABLETS BY MOUTH AT BEDTIME AS NEEDED FOR ITCHING 180 tablet 1  . ipratropium-albuterol (DUONEB) 0.5-2.5 (3) MG/3ML SOLN Take 3 mLs by nebulization every 6 (six) hours as needed. 360 mL 3  . Lancets (ONETOUCH ULTRASOFT) lancets Use as instructed 100 each 12  . meclizine (ANTIVERT) 12.5 MG tablet Take 1 tablet (12.5 mg total) by mouth 3 (three) times daily as needed for dizziness. 30 tablet 0  . metaxalone (SKELAXIN) 800 MG tablet Take 1 tablet (800 mg total) by mouth 3 (three) times daily. 30 tablet 2  . metFORMIN (GLUCOPHAGE-XR) 500 MG 24 hr tablet TAKE 2 TABLET BY MOUTH EVERY DAY WITH BREAKFAST 60 tablet 1  . montelukast (SINGULAIR) 10 MG tablet Take 1 tablet (10 mg total) by mouth at bedtime. 90 tablet 3  . Multiple Vitamin (MULTIVITAMIN) tablet Take 1 tablet by mouth daily.    . NONFORMULARY OR COMPOUNDED ITEM Nebulizer   DX ASTHMA 1 each 0  . nystatin-triamcinolone (MYCOLOG II) cream Apply twice daily to lower chest area/upper abdomen 30 g 0  . ofloxacin (FLOXIN OTIC) 0.3 % OTIC solution Place 10 drops into the right ear daily. 10 mL 0  . omeprazole (PRILOSEC) 20 MG capsule Take 1 capsule (20 mg total) by mouth daily. 90 capsule  3  . omeprazole (PRILOSEC) 20 MG capsule Take 1 capsule (20 mg total) by mouth daily. 30 capsule 3  . ondansetron (ZOFRAN ODT) 4 MG disintegrating tablet Take 1 tablet (4 mg total) by mouth every 8 (eight) hours as needed for nausea or vomiting. 20 tablet 0  . potassium chloride SA (KLOR-CON M20) 20 MEQ tablet Take 1 tablet (20 mEq total) by mouth daily. 90 tablet 1  .  rOPINIRole (REQUIP) 0.25 MG tablet TAKE 1 TABLET BY MOUTH AT BEDTIME FOR 4 DAYS THEN INCREASE TO 2 TABLETS AT BEDTIME IF NEEDED 60 tablet 5  . rosuvastatin (CRESTOR) 10 MG tablet Take 1 tablet (10 mg total) by mouth daily. 30 tablet 2  . Spacer/Aero-Holding Chambers (AEROCHAMBER MV) inhaler Use as instructed 1 each 0  . SYMBICORT 160-4.5 MCG/ACT inhaler Inhale 2 puffs into the lungs every 12 (twelve) hours. 1 each 3  . traMADol (ULTRAM) 50 MG tablet TAKE 1 TABLET (50 MG TOTAL) BY MOUTH EVERY 8 (EIGHT) HOURS AS NEEDED FOR UP TO 5 DAYS. 16 tablet 0  . triamcinolone cream (KENALOG) 0.1 % Apply bid to hands 30 g 0   No facility-administered medications prior to visit.    Allergies  Allergen Reactions  . Losartan Shortness Of Breath  . Oxycodone-Acetaminophen Nausea And Vomiting  . Augmentin [Amoxicillin-Pot Clavulanate] Diarrhea    Review of Systems  Constitutional: Negative for chills, fever and malaise/fatigue.  HENT: Negative for congestion and hearing loss.   Eyes: Negative for discharge.  Respiratory: Positive for cough. Negative for sputum production and shortness of breath.   Cardiovascular: Negative for chest pain, palpitations and leg swelling.  Gastrointestinal: Negative for abdominal pain, blood in stool, constipation, diarrhea, heartburn, nausea and vomiting.  Genitourinary: Negative for dysuria, frequency, hematuria and urgency.  Musculoskeletal: Negative for back pain, falls and myalgias.  Skin: Negative for rash.  Neurological: Negative for dizziness, sensory change, loss of consciousness, weakness and  headaches.  Endo/Heme/Allergies: Negative for environmental allergies. Does not bruise/bleed easily.  Psychiatric/Behavioral: Negative for depression and suicidal ideas. The patient is not nervous/anxious and does not have insomnia.        Objective:    Physical Exam Vitals and nursing note reviewed.  Constitutional:      Appearance: She is well-developed.  HENT:     Head: Normocephalic and atraumatic.     Right Ear: External ear normal.     Left Ear: External ear normal.  Eyes:     General:        Right eye: No discharge.        Left eye: No discharge.     Conjunctiva/sclera: Conjunctivae normal.  Neck:     Thyroid: No thyromegaly.     Vascular: No carotid bruit or JVD.  Cardiovascular:     Rate and Rhythm: Normal rate and regular rhythm.     Heart sounds: Normal heart sounds. No murmur heard.   Pulmonary:     Effort: Pulmonary effort is normal. No respiratory distress.     Breath sounds: Normal breath sounds. No wheezing or rales.  Chest:     Chest wall: No tenderness.  Musculoskeletal:     Cervical back: Normal range of motion and neck supple.  Lymphadenopathy:     Cervical: No cervical adenopathy.  Neurological:     Mental Status: She is alert and oriented to person, place, and time.     BP 120/80 (BP Location: Right Arm, Patient Position: Sitting, Cuff Size: Normal)   Pulse 85   Temp 98.1 F (36.7 C) (Oral)   Resp 18   Ht _0  (1.626 m)   Wt 234 lb 6.4 oz (106.3 kg)   LMP 05/20/2012   SpO2 98%   BMI 40.23 kg/m  Wt Readings from Last 3 Encounters:  02/08/21 234 lb 6.4 oz (106.3 kg)  01/27/21 240 lb 3.2 oz (109 kg)  01/24/21 239 lb (108.4 kg)    Diabetic Foot  Exam - Simple   No data filed    Lab Results  Component Value Date   WBC 5.2 06/01/2020   HGB 13.2 06/01/2020   HCT 40.5 06/01/2020   PLT 168.0 06/01/2020   GLUCOSE 89 12/28/2020   CHOL 178 12/28/2020   TRIG 71.0 12/28/2020   HDL 62.60 12/28/2020   LDLCALC 101 (H) 12/28/2020   ALT 24  12/28/2020   AST 23 12/28/2020   NA 141 12/28/2020   K 4.0 12/28/2020   CL 103 12/28/2020   CREATININE 0.84 12/28/2020   BUN 13 12/28/2020   CO2 32 12/28/2020   TSH 0.74 04/20/2020   INR 1.05 02/12/2017   HGBA1C 7.2 (H) 12/28/2020   MICROALBUR 0.5 07/22/2020    Lab Results  Component Value Date   TSH 0.74 04/20/2020   Lab Results  Component Value Date   WBC 5.2 06/01/2020   HGB 13.2 06/01/2020   HCT 40.5 06/01/2020   MCV 85.1 06/01/2020   PLT 168.0 06/01/2020   Lab Results  Component Value Date   NA 141 12/28/2020   K 4.0 12/28/2020   CO2 32 12/28/2020   GLUCOSE 89 12/28/2020   BUN 13 12/28/2020   CREATININE 0.84 12/28/2020   BILITOT 0.4 12/28/2020   ALKPHOS 75 12/28/2020   AST 23 12/28/2020   ALT 24 12/28/2020   PROT 7.0 12/28/2020   ALBUMIN 3.8 12/28/2020   CALCIUM 9.5 12/28/2020   ANIONGAP 13 05/24/2020   GFR 77.20 12/28/2020   Lab Results  Component Value Date   CHOL 178 12/28/2020   Lab Results  Component Value Date   HDL 62.60 12/28/2020   Lab Results  Component Value Date   LDLCALC 101 (H) 12/28/2020   Lab Results  Component Value Date   TRIG 71.0 12/28/2020   Lab Results  Component Value Date   CHOLHDL 3 12/28/2020   Lab Results  Component Value Date   HGBA1C 7.2 (H) 12/28/2020       Assessment & Plan:   Problem List Items Addressed This Visit   None   Visit Diagnoses    Bronchitis    -  Primary   Relevant Medications   azithromycin (ZITHROMAX Z-PAK) 250 MG tablet   promethazine-dextromethorphan (PROMETHAZINE-DM) 6.25-15 MG/5ML syrup   Other Relevant Orders   DG Chest 2 View (Completed)      I am having Lidya A. Manz start on azithromycin and promethazine-dextromethorphan. I am also having her maintain her multivitamin, Fiber, NONFORMULARY OR COMPOUNDED ITEM, azelastine, aspirin EC, meclizine, diclofenac sodium, montelukast, ipratropium-albuterol, AeroChamber MV, triamcinolone, nystatin-triamcinolone, colchicine, traMADol,  fluticasone, hydrochlorothiazide, potassium chloride SA, onetouch ultrasoft, glucose blood, Calcium 1200, ofloxacin, allopurinol, ondansetron, fluticasone, benzonatate, escitalopram, omeprazole, hydrOXYzine, rOPINIRole, amLODipine, Symbicort, albuterol, cetirizine, blood glucose meter kit and supplies, metFORMIN, B-12, rosuvastatin, metaxalone, diclofenac, omeprazole, and COVID-19 mRNA vaccine [employer]).  Meds ordered this encounter  Medications  . azithromycin (ZITHROMAX Z-PAK) 250 MG tablet    Sig: As directed    Dispense:  6 each    Refill:  0  . promethazine-dextromethorphan (PROMETHAZINE-DM) 6.25-15 MG/5ML syrup    Sig: Take 5 mLs by mouth 4 (four) times daily as needed.    Dispense:  118 mL    Refill:  0     Ann Held, DO

## 2021-02-08 NOTE — Telephone Encounter (Signed)
[facility] [address] [facility] the [facility] of the [facility]. I was then given [facility] phone number [phone]). The first representative had troubles either finding her chart or finding the test, the second said there needed to be a medical record release form faxed to [phone] since a PCP was not listed for the patient.

## 2021-02-08 NOTE — Telephone Encounter (Signed)
Let pt know-- she went down to xray -- if she is still down there maybe xray can have the interpreter tell her to come up here and sign a release

## 2021-02-08 NOTE — Patient Instructions (Signed)
Acute Bronchitis, Adult  Acute bronchitis is sudden or acute swelling of the air tubes (bronchi) in the lungs. Acute bronchitis causes these tubes to fill with mucus, which can make it hard to breathe. It can also cause coughing or wheezing. In adults, acute bronchitis usually goes away within 2 weeks. A cough caused by bronchitis may last up to 3 weeks. Smoking, allergies, and asthma can make the condition worse. What are the causes? This condition can be caused by germs and by substances that irritate the lungs, including:  Cold and flu viruses. The most common cause of this condition is the virus that causes the common cold.  Bacteria.  Substances that irritate the lungs, including: ? Smoke from cigarettes and other forms of tobacco. ? Dust and pollen. ? Fumes from chemical products, gases, or burned fuel. ? Other materials that pollute indoor or outdoor air.  Close contact with someone who has acute bronchitis. What increases the risk? The following factors may make you more likely to develop this condition:  A weak body's defense system, also called the immune system.  A condition that affects your lungs and breathing, such as asthma. What are the signs or symptoms? Common symptoms of this condition include:  Lung and breathing problems, such as: ? Coughing. This may bring up clear, yellow, or green mucus from your lungs (sputum). ? Wheezing. ? Having too much mucus in your lungs (chest congestion). ? Having shortness of breath.  A fever.  Chills.  Aches and pains, including: ? Tightness in your chest and other body aches. ? A sore throat. How is this diagnosed? This condition is usually diagnosed based on:  Your symptoms and medical history.  A physical exam. You may also have other tests, including tests to rule out other conditions, such pneumonia. These tests include:  A test of lung function.  Test of a mucus sample to look for the presence of  bacteria.  Tests to check the oxygen level in your blood.  Blood tests.  Chest X-ray. How is this treated? Most cases of acute bronchitis clear up over time without treatment. Your health care provider may recommend:  Drinking more fluids. This can thin your mucus, which may improve your breathing.  Taking a medicine for a fever or cough.  Using a device that gets medicine into your lungs (inhaler) to help improve breathing and control coughing.  Using a vaporizer or a humidifier. These are machines that add water to the air to help you breathe better. Follow these instructions at home: Activity  Get plenty of rest.  Return to your normal activities as told by your health care provider. Ask your health care provider what activities are safe for you. Lifestyle  Drink enough fluid to keep your urine pale yellow.  Do not drink alcohol.  Do not use any products that contain nicotine or tobacco, such as cigarettes, e-cigarettes, and chewing tobacco. If you need help quitting, ask your health care provider. Be aware that: ? Your bronchitis will get worse if you smoke or breathe in other people's smoke (secondhand smoke). ? Your lungs will heal faster if you quit smoking. General instructions  Take over-the-counter and prescription medicines only as told by your health care provider.  Use an inhaler, vaporizer, or humidifier as told by your health care provider.  If you have a sore throat, gargle with a salt-water mixture 3-4 times a day or as needed. To make a salt-water mixture, completely dissolve -1 tsp (3-6 g)   of salt in 1 cup (237 mL) of warm water.  Keep all follow-up visits as told by your health care provider. This is important.   How is this prevented? To lower your risk of getting this condition again:  Wash your hands often with soap and water. If soap and water are not available, use hand sanitizer.  Avoid contact with people who have cold symptoms.  Try not to  touch your mouth, nose, or eyes with your hands.  Avoid places where there are fumes from chemicals. Breathing these fumes will make your condition worse.  Get the flu shot every year.   Contact a health care provider if:  Your symptoms do not improve after 2 weeks of treatment.  You vomit more than once or twice.  You have symptoms of dehydration such as: ? Dark urine. ? Dry skin or eyes. ? Increased thirst. ? Headaches. ? Confusion. ? Muscle cramps. Get help right away if you:  Cough up blood.  Feel pain in your chest.  Have severe shortness of breath.  Faint or keep feeling like you are going to faint.  Have a severe headache.  Have fever or chills that get worse. These symptoms may represent a serious problem that is an emergency. Do not wait to see if the symptoms will go away. Get medical help right away. Call your local emergency services (911 in the [place] Do not drive yourself to the hospital. Summary  Acute bronchitis is sudden (acute) inflammation of the air tubes (bronchi) between the windpipe and the lungs. In adults, acute bronchitis usually goes away within 2 weeks, although coughing may last 3 weeks or longer  Take over-the-counter and prescription medicines only as told by your health care provider.  Drink enough fluid to keep your urine pale yellow.  Contact a health care provider if your symptoms do not improve after 2 weeks of treatment.  Get help right away if you cough up blood, faint, or have chest pain or shortness of breath. This information is not intended to replace advice given to you by your health care provider. Make sure you discuss any questions you have with your health care provider. Document Revised: 07/07/2019 Document Reviewed: 05/16/2019 Elsevier Patient Education  2021 [employer]vier Inc.  

## 2021-02-09 ENCOUNTER — Telehealth: Payer: Self-pay

## 2021-02-09 NOTE — Telephone Encounter (Signed)
Caller states her husband told her that she missed a call from the office and she is returning the call. She would like to know the nature of the call.  Telephone: [phone]

## 2021-02-09 NOTE — Telephone Encounter (Signed)
Attempted to call patient back. Not sure what the call was for. No documentation. Advised pt if any concerns to call back

## 2021-02-11 ENCOUNTER — Other Ambulatory Visit: Payer: Self-pay | Admitting: Family Medicine

## 2021-02-11 ENCOUNTER — Telehealth: Payer: Self-pay | Admitting: Family Medicine

## 2021-02-11 DIAGNOSIS — IMO0002 Reserved for concepts with insufficient information to code with codable children: Secondary | ICD-10-CM

## 2021-02-11 DIAGNOSIS — E1151 Type 2 diabetes mellitus with diabetic peripheral angiopathy without gangrene: Secondary | ICD-10-CM

## 2021-02-11 NOTE — Telephone Encounter (Signed)
Patient brought in forms to be filled out Would like them to be faxed first & then she would like to be called so she can come get them when ready  Placed into lowne folder

## 2021-02-14 NOTE — Telephone Encounter (Signed)
Form given to Dr Etter Sjogren.

## 2021-02-14 NOTE — Telephone Encounter (Signed)
Is it for the bronchitis or for another reason?  For just one cont time out of work for the broncitis.   I know she needs it by tomorrow but I need to know why we are filling it out

## 2021-02-14 NOTE — Telephone Encounter (Signed)
Mychart message sent that [facility] forms are completed based on bronchitis and are up front.

## 2021-02-14 NOTE — Telephone Encounter (Signed)
I left a VM for pt to return call to try to figure out why we are filling out the forms-since the notes in the envelope with the forms were from her missing work and her husbands hospitalization.

## 2021-02-15 ENCOUNTER — Ambulatory Visit: Payer: Federal, State, Local not specified - PPO

## 2021-02-15 ENCOUNTER — Telehealth: Payer: Self-pay

## 2021-02-15 NOTE — Telephone Encounter (Signed)
Caller states they have a missed call.  Telephone: [phone]

## 2021-02-15 NOTE — Telephone Encounter (Signed)
Left vm to let pt know her forms are up front to be picked up.

## 2021-02-21 ENCOUNTER — Ambulatory Visit: Payer: Federal, State, Local not specified - PPO

## 2021-02-22 ENCOUNTER — Ambulatory Visit: Payer: Federal, State, Local not specified - PPO | Admitting: Medical

## 2021-02-22 ENCOUNTER — Other Ambulatory Visit: Payer: Self-pay

## 2021-02-22 VITALS — BP 112/80 | HR 84 | Temp 98.4°F | Resp 20 | Ht 64.0 in | Wt 235.0 lb

## 2021-02-22 DIAGNOSIS — R197 Diarrhea, unspecified: Secondary | ICD-10-CM | POA: Diagnosis not present

## 2021-02-22 DIAGNOSIS — K219 Gastro-esophageal reflux disease without esophagitis: Secondary | ICD-10-CM

## 2021-02-22 MED ORDER — SUCRALFATE 1 G PO TABS: ORAL_TABLET | ORAL | 0 refills | Status: DC

## 2021-02-22 MED ORDER — ALUM & MAG HYDROXIDE-SIMETH 200-200-20 MG/5ML PO SUSP: 30.0000 mL | Freq: Once | ORAL | Status: AC

## 2021-02-22 MED ORDER — HYOSCYAMINE SULFATE 0.125 MG SL SUBL: 0.1250 mg | SUBLINGUAL_TABLET | Freq: Once | SUBLINGUAL | Status: DC

## 2021-02-22 MED ORDER — FAMOTIDINE 20 MG PO TABS: 20.0000 mg | ORAL_TABLET | Freq: Every day | ORAL | 0 refills | Status: DC

## 2021-02-22 MED ORDER — LIDOCAINE VISCOUS HCL 2 % MT SOLN: 15.0000 mL | Freq: Once | OROMUCOSAL | Status: AC

## 2021-02-22 NOTE — Patient Instructions (Addendum)
Recent epigastric pain immediately after eating and feeling full easily.  This seemed to coincide with you discontinuing omeprazole over the last month.  Wanted to restart omeprazole and will add famotidine 20 mg to take daily.  Also prescribed Carafate.  If with additional medication your symptoms persist then would refer to GI for possible EGD.  You have history of diabetes and considering gastroparesis could be a factor if your symptoms persist.  Will get CBC, CMP and lipase today.  If signs symptoms change or worsen let us know.  If severe symptoms then recommend ED evaluation.  For diabetes continue metformin and low sugar diet.  Follow-up in 2 weeks or as needed.

## 2021-02-22 NOTE — Progress Notes (Signed)
Subjective:    Patient ID: Rhonda Morrow, female    DOB: November 02, 1961, 60 y.o.   MRN: [id number]  HPI Pt in with recent epigastric pain that is occurring after eating. She is getting full very fast. Some mild occasional rare belching. Immediate after eating eating will get abdomen pain.   May take an hour or so before pain subsides. No vomiting. Does feel little nausea at times.  No chest pain or cardiac type symptoms reported.   Pt ate lunch today 2 hours ago. Very little. Only 4-5 fries so no pain.   Pt has omeprazole prescription. Last took on Saturday. Pain in epigastric area got worse on Sunday. Recent overall had epigastric for 10 days. About 2 weeks ago when had bronchitis she stopped omeprazole for about 4 weeks.   Pt last a1c was 7.2. Recent increase metformin to 2 tab a day.     Review of Systems  Constitutional: Negative for chills, fatigue and fever.  Respiratory: Negative for cough, chest tightness, shortness of breath, wheezing and stridor.   Cardiovascular: Negative for chest pain.  Gastrointestinal: Positive for abdominal pain and nausea. Negative for abdominal distention, blood in stool, constipation, diarrhea and vomiting.       Rare occasional nausea  Genitourinary: Negative for dysuria and flank pain.  Musculoskeletal: Negative for back pain.  Skin: Negative for rash.  Neurological: Negative for dizziness, weakness, light-headedness and headaches.  Hematological: Negative for adenopathy. Does not bruise/bleed easily.  Psychiatric/Behavioral: Negative for behavioral problems and confusion. The patient is not nervous/anxious.     Past Medical History:  Diagnosis Date  . Arthritis   . Asthma   . Complication of anesthesia    one time woke up and was vey scared,17 yrs ago  . Deaf   . Diabetes mellitus without complication ([place])   . GERD (gastroesophageal reflux disease)   . Hypertension   . Left groin pain   . Thyroid disease      Social History    Socioeconomic History  . Marital status: Married    Spouse name: Not on file  . Number of children: Not on file  . Years of education: Not on file  . Highest education level: Not on file  Occupational History  . Occupation: disabled  Tobacco Use  . Smoking status: Never Smoker  . Smokeless tobacco: Never Used  Vaping Use  . Vaping Use: Never used  Substance and Sexual Activity  . Alcohol use: No    Alcohol/week: 0.0 standard drinks  . Drug use: No  . Sexual activity: Never  Other Topics Concern  . Not on file  Social History Narrative   Lives with family.  Has 3 children.  Works for [employer].  Education: high school.   Social Determinants of Health   Financial Resource Strain: Not on file  Food Insecurity: Not on file  Transportation Needs: Not on file  Physical Activity: Not on file  Stress: Not on file  Social Connections: Not on file  Intimate Partner Violence: Not on file    Past Surgical History:  Procedure Laterality Date  . CARDIAC CATHETERIZATION    . CESAREAN SECTION    . CHOLECYSTECTOMY N/A 06/20/2016   Procedure: LAPAROSCOPIC CHOLECYSTECTOMY;  Surgeon: Rolm Bookbinder, MD;  Location: [place];  Service: General;  Laterality: N/A;  . ESOPHAGEAL MANOMETRY N/A 09/29/2013   Procedure: ESOPHAGEAL MANOMETRY (EM);  Surgeon: Milus Banister, MD;  [facility];  Service: Endoscopy;  Laterality: N/A;  .  TOTAL HIP ARTHROPLASTY Left 02/21/2017   Procedure: LEFT TOTAL HIP ARTHROPLASTY ANTERIOR APPROACH;  Surgeon: Leandrew Koyanagi, MD;  Location: [place];  Service: Orthopedics;  Laterality: Left;    Family History  Problem Relation Age of Onset  . Diabetes Mother   . Heart disease Mother   . Diabetes Father     Allergies  Allergen Reactions  . Losartan Shortness Of Breath  . Oxycodone-Acetaminophen Nausea And Vomiting  . Augmentin [Amoxicillin-Pot Clavulanate] Diarrhea    Current Outpatient Medications on File Prior to Visit  Medication Sig Dispense  Refill  . albuterol (VENTOLIN HFA) 108 (90 Base) MCG/ACT inhaler Inhale 1-2 puffs into the lungs every 6 (six) hours as needed for wheezing or shortness of breath. 8 g 6  . allopurinol (ZYLOPRIM) 100 MG tablet Take 1 tablet (100 mg total) by mouth daily. 90 tablet 3  . amLODipine (NORVASC) 5 MG tablet TAKE 1 TABLET BY MOUTH EVERY DAY 90 tablet 1  . aspirin EC 325 MG tablet Take 1 tablet (325 mg total) by mouth 2 (two) times daily. 84 tablet 0  . azelastine (ASTELIN) 0.1 % nasal spray Place 2 sprays into both nostrils 2 (two) times daily. Use in each nostril as directed 30 mL 3  . blood glucose meter kit and supplies KIT Dispense based on patient and insurance preference. Use up to four times daily as directed. (FOR ICD-9 250.00, 250.01). 1 each 0  . Calcium Carbonate-Vit D-Min (CALCIUM 1200) 1200-1000 MG-UNIT CHEW 1 po qd 30 tablet 11  . cetirizine (ZYRTEC) 10 MG tablet TAKE 1 TABLET BY MOUTH EVERY DAY 90 tablet 1  . colchicine 0.6 MG tablet Take 1 tablet (0.6 mg total) by mouth daily. 90 tablet 1  . COVID-19 mRNA vaccine, Pfizer, 30 MCG/0.3ML injection INJECT AS DIRECTED .3 mL 0  . Cyanocobalamin (B-12) 1000 MCG SUBL Dissolove 1 tablet under tongue once a day 90 tablet 1  . diclofenac (VOLTAREN) 75 MG EC tablet Take 1 tablet (75 mg total) by mouth 2 (two) times daily as needed. 60 tablet 3  . diclofenac sodium (VOLTAREN) 1 % GEL Apply 4 g topically 4 (four) times daily as needed. 500 g 6  . escitalopram (LEXAPRO) 10 MG tablet TAKE 1 TABLET BY MOUTH EVERYDAY AT BEDTIME 90 tablet 1  . Fiber CHEW Chew 1 tablet by mouth daily.    . fluticasone (FLONASE) 50 MCG/ACT nasal spray Place 2 sprays into both nostrils daily. 16 g 6  . fluticasone (FLONASE) 50 MCG/ACT nasal spray SPRAY 2 SPRAYS INTO EACH NOSTRIL EVERY DAY 48 mL 1  . glucose blood test strip Use as instructed--- one touch verio test strips 100 each 12  . hydrochlorothiazide (HYDRODIURIL) 12.5 MG tablet Take 1 tablet (12.5 mg total) by mouth  daily. 90 tablet 1  . hydrOXYzine (ATARAX/VISTARIL) 10 MG tablet TAKE 1 TO 2 TABLETS BY MOUTH AT BEDTIME AS NEEDED FOR ITCHING 180 tablet 1  . ipratropium-albuterol (DUONEB) 0.5-2.5 (3) MG/3ML SOLN Take 3 mLs by nebulization every 6 (six) hours as needed. 360 mL 3  . Lancets (ONETOUCH ULTRASOFT) lancets Use as instructed 100 each 12  . meclizine (ANTIVERT) 12.5 MG tablet Take 1 tablet (12.5 mg total) by mouth 3 (three) times daily as needed for dizziness. 30 tablet 0  . metaxalone (SKELAXIN) 800 MG tablet Take 1 tablet (800 mg total) by mouth 3 (three) times daily. 30 tablet 2  . metFORMIN (GLUCOPHAGE-XR) 500 MG 24 hr tablet TAKE 2 TABLET BY MOUTH EVERY  DAY WITH BREAKFAST 60 tablet 1  . montelukast (SINGULAIR) 10 MG tablet Take 1 tablet (10 mg total) by mouth at bedtime. 90 tablet 3  . Multiple Vitamin (MULTIVITAMIN) tablet Take 1 tablet by mouth daily.    . NONFORMULARY OR COMPOUNDED ITEM Nebulizer   DX ASTHMA 1 each 0  . nystatin-triamcinolone (MYCOLOG II) cream Apply twice daily to lower chest area/upper abdomen 30 g 0  . ofloxacin (FLOXIN OTIC) 0.3 % OTIC solution Place 10 drops into the right ear daily. 10 mL 0  . omeprazole (PRILOSEC) 20 MG capsule Take 1 capsule (20 mg total) by mouth daily. 90 capsule 3  . omeprazole (PRILOSEC) 20 MG capsule Take 1 capsule (20 mg total) by mouth daily. 30 capsule 3  . ondansetron (ZOFRAN ODT) 4 MG disintegrating tablet Take 1 tablet (4 mg total) by mouth every 8 (eight) hours as needed for nausea or vomiting. 20 tablet 0  . potassium chloride SA (KLOR-CON M20) 20 MEQ tablet Take 1 tablet (20 mEq total) by mouth daily. 90 tablet 1  . promethazine-dextromethorphan (PROMETHAZINE-DM) 6.25-15 MG/5ML syrup Take 5 mLs by mouth 4 (four) times daily as needed. 118 mL 0  . rOPINIRole (REQUIP) 0.25 MG tablet TAKE 1 TABLET BY MOUTH AT BEDTIME FOR 4 DAYS THEN INCREASE TO 2 TABLETS AT BEDTIME IF NEEDED 60 tablet 5  . rosuvastatin (CRESTOR) 10 MG tablet Take 1 tablet (10  mg total) by mouth daily. 30 tablet 2  . Spacer/Aero-Holding Chambers (AEROCHAMBER MV) inhaler Use as instructed 1 each 0  . SYMBICORT 160-4.5 MCG/ACT inhaler Inhale 2 puffs into the lungs every 12 (twelve) hours. 1 each 3  . traMADol (ULTRAM) 50 MG tablet TAKE 1 TABLET (50 MG TOTAL) BY MOUTH EVERY 8 (EIGHT) HOURS AS NEEDED FOR UP TO 5 DAYS. 16 tablet 0  . triamcinolone cream (KENALOG) 0.1 % Apply bid to hands 30 g 0   No current facility-administered medications on file prior to visit.    BP 112/80   Pulse 84   Temp 98.4 F (36.9 C)   Resp 20   Ht 5' 4"  (1.626 m)   Wt 235 lb (106.6 kg)   LMP 05/20/2012   SpO2 99%   BMI 40.34 kg/m      Objective:   Physical Exam  General Appearance- Not in acute distress.  HEENT Eyes- Scleraeral/Conjuntiva-bilat- Not Yellow. Mouth & Throat- Normal.  Chest and Lung Exam Auscultation: Breath sounds:-Normal. Adventitious sounds:- No Adventitious sounds.  Cardiovascular Auscultation:Rythm - Regular. Heart Sounds -Normal heart sounds.  Abdomen Inspection:-Inspection Normal.  Palpation/Perucssion: Palpation and Percussion of the abdomen reveal-epigastric tender, No Rebound tenderness, No rigidity(Guarding) and No Palpable abdominal masses.  Liver:-Normal.  Spleen:- Normal.         Assessment & Plan:  Recent epigastric pain immediately after eating and feeling full easily.  This seemed to coincide with you discontinuing omeprazole over the last month.  Wanted to restart omeprazole and will add famotidine 20 mg to take daily.  Also prescribed Carafate.  If with additional medication your symptoms persist then would refer to GI for possible EGD.  You have history of diabetes and considering gastroparesis could be a factor if your symptoms persist.  Will get CBC, CMP and lipase today.  If signs symptoms change or worsen let us know.  If severe symptoms then recommend ED evaluation.  For diabetes continue metformin and low sugar  diet.  Follow-up in 2 weeks or as needed.

## 2021-02-23 LAB — CBC WITH DIFFERENTIAL/PLATELET
Basophils Absolute: 0 10*3/uL (ref 0.0–0.1)
Basophils Relative: 0.9 % (ref 0.0–3.0)
Eosinophils Absolute: 0.1 10*3/uL (ref 0.0–0.7)
Eosinophils Relative: 2.4 % (ref 0.0–5.0)
HCT: 42.5 % (ref 36.0–46.0)
Hemoglobin: 13.9 g/dL (ref 12.0–15.0)
Lymphocytes Relative: 28.7 % (ref 12.0–46.0)
Lymphs Abs: 1.4 10*3/uL (ref 0.7–4.0)
MCHC: 32.7 g/dL (ref 30.0–36.0)
MCV: 84.1 fl (ref 78.0–100.0)
Monocytes Absolute: 0.5 10*3/uL (ref 0.1–1.0)
Monocytes Relative: 11.3 % (ref 3.0–12.0)
Neutro Abs: 2.7 10*3/uL (ref 1.4–7.7)
Neutrophils Relative %: 56.7 % (ref 43.0–77.0)
Platelets: 138 10*3/uL — ABNORMAL LOW (ref 150.0–400.0)
RBC: 5.05 Mil/uL (ref 3.87–5.11)
RDW: 15 % (ref 11.5–15.5)
WBC: 4.8 10*3/uL (ref 4.0–10.5)

## 2021-02-23 LAB — COMPREHENSIVE METABOLIC PANEL
ALT: 25 U/L (ref 0–35)
AST: 22 U/L (ref 0–37)
Albumin: 3.5 g/dL (ref 3.5–5.2)
Alkaline Phosphatase: 55 U/L (ref 39–117)
BUN: 11 mg/dL (ref 6–23)
CO2: 30 mEq/L (ref 19–32)
Calcium: 9 mg/dL (ref 8.4–10.5)
Chloride: 99 mEq/L (ref 96–112)
Creatinine, Ser: 0.96 mg/dL (ref 0.40–1.20)
GFR: 65.7 mL/min (ref >60.00)
Glucose, Bld: 207 mg/dL — ABNORMAL HIGH (ref 70–99)
Potassium: 3.6 mEq/L (ref 3.5–5.1)
Sodium: 137 mEq/L (ref 135–145)
Total Bilirubin: 0.5 mg/dL (ref 0.2–1.2)
Total Protein: 6.4 g/dL (ref 6.0–8.3)

## 2021-02-23 LAB — LIPASE: Lipase: 14 U/L (ref 11.0–59.0)

## 2021-02-28 ENCOUNTER — Ambulatory Visit: Payer: Federal, State, Local not specified - PPO | Attending: Plastic Surgery

## 2021-02-28 ENCOUNTER — Other Ambulatory Visit: Payer: Self-pay

## 2021-02-28 DIAGNOSIS — M25611 Stiffness of right shoulder, not elsewhere classified: Secondary | ICD-10-CM | POA: Diagnosis not present

## 2021-02-28 DIAGNOSIS — M25512 Pain in left shoulder: Secondary | ICD-10-CM | POA: Insufficient documentation

## 2021-02-28 DIAGNOSIS — G8929 Other chronic pain: Secondary | ICD-10-CM

## 2021-02-28 DIAGNOSIS — M25612 Stiffness of left shoulder, not elsewhere classified: Secondary | ICD-10-CM

## 2021-02-28 DIAGNOSIS — M6281 Muscle weakness (generalized): Secondary | ICD-10-CM | POA: Diagnosis not present

## 2021-02-28 DIAGNOSIS — M25511 Pain in right shoulder: Secondary | ICD-10-CM | POA: Diagnosis not present

## 2021-02-28 DIAGNOSIS — R293 Abnormal posture: Secondary | ICD-10-CM | POA: Diagnosis not present

## 2021-02-28 NOTE — Therapy (Signed)
[facility]. [place], [place], [address] Phone: [phone]   Fax:  [phone]  Physical Therapy Treatment  Patient Details  Name: Rhonda Morrow MRN: [id number] Date of Birth: Jun 12, 1961 Referring Provider (PT): Marla Roe Loel Lofty, DO   Encounter Date: 02/28/2021   PT End of Session - 02/28/21 1620    Visit Number 2    Number of Visits 9    Date for PT Re-Evaluation 03/28/21    Authorization Type BCBS    PT Start Time 1530    PT Stop Time 1615    PT Time Calculation (min) 45 min    Activity Tolerance Patient tolerated treatment well    Behavior During Therapy [facility] for tasks assessed/performed           Past Medical History:  Diagnosis Date  . Arthritis   . Asthma   . Complication of anesthesia    one time woke up and was vey scared,17 yrs ago  . Deaf   . Diabetes mellitus without complication ([place])   . GERD (gastroesophageal reflux disease)   . Hypertension   . Left groin pain   . Thyroid disease     Past Surgical History:  Procedure Laterality Date  . CARDIAC CATHETERIZATION    . CESAREAN SECTION    . CHOLECYSTECTOMY N/A 06/20/2016   Procedure: LAPAROSCOPIC CHOLECYSTECTOMY;  Surgeon: Rolm Bookbinder, MD;  Location: [place];  Service: General;  Laterality: N/A;  . ESOPHAGEAL MANOMETRY N/A 09/29/2013   Procedure: ESOPHAGEAL MANOMETRY (EM);  Surgeon: Milus Banister, MD;  [facility];  Service: Endoscopy;  Laterality: N/A;  . TOTAL HIP ARTHROPLASTY Left 02/21/2017   Procedure: LEFT TOTAL HIP ARTHROPLASTY ANTERIOR APPROACH;  Surgeon: Leandrew Koyanagi, MD;  Location: [place];  Service: Orthopedics;  Laterality: Left;    There were no vitals filed for this visit.   Subjective Assessment - 02/28/21 1535    Subjective Shoulders bothering a little. Back was bothering yesterday but get better when moving around. Have not had the time to do the exercises between work    Patient is accompained by:  [employer]   Pertinent History Deaf, left THA, asthma, DM, arthritis, HTN, gout right foot    Diagnostic tests Diagnostic US: concern for supraspinatus tear left shoulder.  Patient also has concern for subacromial bursitis of the right shoulder.    Patient Stated Goals to get rid of pain , get back to PLOF    Currently in Pain? Yes    Pain Score 4     Pain Location --   low back and B shoulders                            [facility] Adult PT Treatment/Exercise - 02/28/21 0001      Exercises   Exercises Lumbar;Knee/Hip;Ankle;Shoulder;Neck      Lumbar Exercises: Stretches   Double Knee to Chest Stretch --   10 reps with P ball   Lower Trunk Rotation 10 seconds   10 reps.   Lower Trunk Rotation Limitations x 10 on ball      Lumbar Exercises: Aerobic   UBE (Upper Arm Bike) L1 2 each way      Lumbar Exercises: Seated   Other Seated Lumbar Exercises March 10 x 2 B alternating      Lumbar Exercises: Supine   Bridge 10 reps   2 sets  Shoulder Exercises: Seated   Retraction Strengthening;Both;10 reps   x 2, redTB     Neck Exercises: Stretches   Upper Trapezius Stretch Right;Left;2 reps;20 seconds    Levator Stretch Left;Right;2 reps;20 seconds          Doorway pec stretch at 45-60 deg 30" x 3 B. Standing marches x 10 B. Standing wall walks for shoulder flexion x 5 B          PT Short Term Goals - 01/31/21 1601      PT SHORT TERM GOAL #1   Title Independent with initial HEP    Time 2    Period Weeks    Status New    Target Date 02/14/21      PT SHORT TERM GOAL #2   Title Pt will demo good understanding for body mechanics with ADLs, lifting    Time 4    Period Weeks    Status New    Target Date 02/28/21             PT Long Term Goals - 01/31/21 1538      PT LONG TERM GOAL #1   Title decreased pain in bilateral shoulders with ADLS by 58% or more to normalize ADLS.    Time 8    Period Weeks    Status New    Target  Date 03/28/21      PT LONG TERM GOAL #2   Title Improved B shoulder ROM to [facility] with </= 2/10 pain to facilitate ability to reach behind back/neck for grooming and dressing as well as household chores    Baseline Very limited and painful functional IR and ER    Time 8    Period Weeks    Status New    Target Date 03/28/21      PT LONG TERM GOAL #3   Title Pt will be able to lift various weighted objects with good body mechanics and </= 2/10 pain to facilitate painfree work/home activities.    Time 8    Period Weeks    Status New    Target Date 03/28/21      PT LONG TERM GOAL #4   Title Pt able to sleep without waking from pain    Time 8    Period Weeks    Status New    Target Date 03/28/21                 Plan - 02/28/21 1620    Clinical Impression Statement Pt tolerated first treatment session well. Reprots not doing exercises much because of schedule between work and having to help sick mother and husband. Reinforced and updated HEP and educated pt to pick a few exercises to do each day. She did not c/o any pain with exercises or stretches today and tolerated nicely.    Personal Factors and Comorbidities Comorbidity 3+    Comorbidities DM, OSA, Hyperlipidemia, Restless leg syndrome, gout, HTN, Asthma, left THA, Deaf, Arthitis    Stability/Clinical Decision Making Evolving/Moderate complexity    Rehab Potential Good    PT Frequency 1x / week    PT Duration 8 weeks    PT Treatment/Interventions ADLs/Self Care Home Management;Cryotherapy;Electrical Stimulation;Iontophoresis 4mg /ml Dexamethasone;Moist Heat;Therapeutic exercise;Neuromuscular re-education;Patient/family education;Manual techniques;Dry needling;Taping;Functional mobility training;Therapeutic activities;Energy conservation    PT Next Visit Plan Shoulder FOTO.  Progress B Shoulder Strength and ROM within pain limited ranges, anterior chest/shoulder and neck ms flexiblity, core/LE flexibility. Educate regarding  postural strengthening and body mechanics. Manual  and modalities as needed for pain and ms extensibility.    Consulted and Agree with Plan of Care Patient   Interpretor          Patient will benefit from skilled therapeutic intervention in order to improve the following deficits and impairments:  Pain,Decreased range of motion,Decreased strength,Impaired UE functional use,Decreased mobility,Postural dysfunction,Improper body mechanics,Impaired flexibility  Visit Diagnosis: Acute pain of right shoulder  Stiffness of right shoulder, not elsewhere classified  Chronic left shoulder pain  Stiffness of left shoulder, not elsewhere classified  Muscle weakness (generalized)  Abnormal posture     Problem List Patient Active Problem List   Diagnosis Date Noted  . Acute pain of left shoulder 01/24/2021  . Acute pain of right shoulder 01/24/2021  . Foot pain, bilateral 01/24/2021  . Dyspepsia 01/24/2021  . Preventative health care 01/24/2021  . Hand pain, right 12/29/2020  . OSA (obstructive sleep apnea) 09/12/2020  . Nocturia more than twice per night 08/03/2020  . Non-restorative sleep 08/03/2020  . Leg cramping 08/03/2020  . Hyperlipidemia associated with type 2 diabetes mellitus ([place]) 07/25/2020  . Large breasts 07/22/2020  . RLS (restless legs syndrome) 05/17/2020  . Cough 04/01/2020  . Moderate persistent asthma without complication 69/48/5462  . Medication management 04/01/2020  . Menopausal symptoms 03/29/2020  . Hot flashes 03/29/2020  . Chronic right shoulder pain 08/11/2019  . Acute non-recurrent maxillary sinusitis 08/11/2019  . BMI 40.0-44.9, adult ([place]) 01/22/2019  . Right lower quadrant abdominal pain 10/20/2018  . Rectal pain 10/20/2018  . Vitamin B 12 deficiency 03/28/2018  . B12 deficiency 03/28/2018  . DM (diabetes mellitus) type II uncontrolled, periph vascular disorder ([place]) 03/28/2018  . Hyperlipidemia 03/28/2018  . Primary osteoarthritis of left hip  02/21/2017  . 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 History of laparoscopic cholecystectomy 06/20/2016  . Cerumen impaction 06/09/2016  . Hypersomnia 05/16/2016  . Knee pain, right 05/05/2016  . RUQ pain 04/09/2016  . Abnormal CT scan 03/28/2016  . Dyspnea and respiratory abnormalities 03/15/2016  . Asthma with acute exacerbation 03/15/2016  . Asthma in adult 02/25/2016  . DM (diabetes mellitus) type II controlled, neurological manifestation ([place]) 02/09/2016  . Left hip pain 12/23/2015  . Right hamstring muscle strain 11/18/2015  . Abdominal pain, acute 09/01/2015  . Acute asthma exacerbation 04/16/2015  . Acute bronchitis 04/14/2015  . Pap smear for cervical cancer screening 09/14/2014  . Bed bug bite 05/06/2014  . Allergic rhinitis 04/09/2014  . Rectal itching 04/09/2014  . Bladder spasm 04/09/2014  . Knee pain, left 03/05/2014  . Hypokalemia 02/19/2014  . Nausea with vomiting 02/19/2014  . Diabetes mellitus, type II ([place]) 02/19/2014  . Edema 02/16/2014  . Disorder of rotator cuff 11/12/2013  . Routine general medical examination at a health care facility 08/20/2013  . GERD (gastroesophageal reflux disease) 06/26/2013  . Dysphagia, pharyngoesophageal phase 06/26/2013  . Medication side effect 03/25/2013  . Diarrhea 03/25/2013  . Benign positional vertigo 01/30/2013  . Traumatic hematoma of thigh 01/15/2013  . HTN (hypertension) 09/02/2012  . Dry skin 08/22/2012  . External hemorrhoids 08/22/2012  . Obesity 06/30/2012  . Thyromegaly 05/22/2012  . Back pain 05/22/2012  . Elevated glucose 05/22/2012  . Moderate persistent asthma 04/19/2012  . Dyspnea 01/28/2012    Hall Busing, PT, DPT 02/28/2021, 4:24 PM  [facility]. [place], [place], [address] Phone: [phone]   Fax:  [phone]  Name: Rhonda Morrow MRN: [id number]  Date of Birth: Dec 17, 1960

## 2021-03-02 ENCOUNTER — Telehealth: Payer: Self-pay | Admitting: Family Medicine

## 2021-03-02 NOTE — Telephone Encounter (Signed)
Pt dropped off document to be filled out by provider (FMLA - 5 pages with envelope and instructions) Pt states is needing document to be faxed before May 6 since pt had documents done before and it was not send out before the time given. Pt would like document to be faxed to [phone] and also to have a copy for her to pick up at our office- pt tel [phone]. Document put at front office tray under providers name. Please advise. ASAP.

## 2021-03-04 NOTE — Progress Notes (Signed)
I, Peterson Lombard, LAT, ATC acting as a scribe for Lynne Leader, MD.  Rhonda Morrow is a 60 y.o. female who presents to [place] at [facility] today for f/u chronic bilat shoulder pain, L>R. 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 Pt was last seen by Dr. [name] on 01/27/21 and was referred to PT of which she's completed 2 visits. Today, pt reports bilat shoulders are feeling about the same. Pt admits to missing PT for 4 weeks due to having bronchitis. She would like to have more PT ordered. She has been working on [employer] some, but not very regimented. Pt reports being very busy caring for her mom, husband, and being ill.  Dx imaging: 01/24/21 R & L shoulder XR  Pertinent review of systems: No fevers or chills  Relevant historical information: Hypertension.  Diabetes.  Deaf   Exam:  BP (!) 140/92 (BP Location: Right Arm, Patient Position: Sitting, Cuff Size: Normal)   Pulse 92   Wt 236 lb 3.2 oz (107.1 kg)   LMP 05/20/2012   SpO2 97%   BMI 40.54 kg/m  General: Well Developed, well nourished, and in no acute distress.   MSK: Right shoulder normal. Normal motion. Nontender. Pain with abduction. Positive Hawkins and Neer's test. Positive empty can test. Negative Yergason's and speeds test. Intact strength.  Left shoulder normal-appearing Nontender. Range of motion limited abduction and external rotation Strength 4/5 to abduction and external rotation. Positive Hawkins and Neer's test.  Positive empty can test. Negative Yergason's and speeds test.    Lab and Radiology Results  EXAM: LEFT SHOULDER - 2+ VIEW  COMPARISON:  None.  FINDINGS: There is no evidence of fracture or dislocation. There is no evidence of arthropathy or other focal bone abnormality. Soft tissues are unremarkable.  IMPRESSION: No acute fracture or malalignment.   Electronically Signed   By: Ruthann Cancer MD   On: 01/25/2021 09:13   EXAM: RIGHT SHOULDER -  2+ VIEW  COMPARISON:  November 08, 2018  FINDINGS: Oblique, Y scapular, and axillary views obtained. No fracture or dislocation. There is moderate generalized joint space narrowing. No erosive change. Focal calcification noted immediately lateral to the acromion, stable. Visualized right lung clear.  IMPRESSION: Moderate generalized osteoarthritic change. Suspect calcific tendinosis immediately lateral to the acromion. No fracture or dislocation. No erosion.   Electronically Signed   By: Lowella Grip III M.D.   On: 01/25/2021 10:09   I, Lynne Leader, personally (independently) visualized and performed the interpretation of the images attached in this note.     Assessment and Plan: 60 y.o. female with bilateral shoulder pain left worse than right thought to be due to possible supraspinatus injury left and subacromial bursitis right.  She has had some improvement with physical therapy but has had a big setback because she was ill for the last 4 weeks and unable to really do any therapy.  Discussed options.  She would like to continue her therapy which I think is reasonable.  Plan to reauthorize PT and reassess in about 1 month.  If not improved at that point consider either injection or MRI.   PDMP not reviewed this encounter. Orders Placed This Encounter  Procedures  . Ambulatory referral to Physical Therapy    Referral Priority:   Routine    Referral Type:   Physical Medicine    Referral Reason:   Specialty Services Required    Requested Specialty:   Physical Therapy  No orders of the defined types were placed in this encounter.    Discussed warning signs or symptoms. Please see discharge instructions. Patient expresses understanding.   The above documentation has been reviewed and is accurate and complete Lynne Leader, M.D.  Visit conducted using an American sign language interpreter.

## 2021-03-06 ENCOUNTER — Other Ambulatory Visit: Payer: Self-pay | Admitting: Family Medicine

## 2021-03-07 ENCOUNTER — Ambulatory Visit (INDEPENDENT_AMBULATORY_CARE_PROVIDER_SITE_OTHER): Payer: Federal, State, Local not specified - PPO | Admitting: Family Medicine

## 2021-03-07 ENCOUNTER — Other Ambulatory Visit: Payer: Self-pay

## 2021-03-07 VITALS — BP 140/92 | HR 92 | Wt 236.2 lb

## 2021-03-07 DIAGNOSIS — G8929 Other chronic pain: Secondary | ICD-10-CM

## 2021-03-07 DIAGNOSIS — M25511 Pain in right shoulder: Secondary | ICD-10-CM

## 2021-03-07 DIAGNOSIS — M25512 Pain in left shoulder: Secondary | ICD-10-CM

## 2021-03-07 DIAGNOSIS — J4520 Mild intermittent asthma, uncomplicated: Secondary | ICD-10-CM

## 2021-03-07 MED ORDER — SYMBICORT 160-4.5 MCG/ACT IN AERO: 2.0000 | INHALATION_SPRAY | Freq: Two times a day (BID) | RESPIRATORY_TRACT | 8 refills | Status: DC

## 2021-03-07 NOTE — Telephone Encounter (Signed)
done

## 2021-03-07 NOTE — Patient Instructions (Signed)
Thank you for coming in today.  Continue physical therapy.   Recheck in 1 month.   Schedule ASL interpreter for the next visit.   If not better next step will likely be MRI.

## 2021-03-07 NOTE — Telephone Encounter (Signed)
FYI: Form filled out to the best of my ability based off of last form.

## 2021-03-07 NOTE — Telephone Encounter (Signed)
Mychart message sent to inform pt that the forms were faxed and the originals were placed up front.

## 2021-03-08 ENCOUNTER — Ambulatory Visit (HOSPITAL_BASED_OUTPATIENT_CLINIC_OR_DEPARTMENT_OTHER)
Admission: RE | Admit: 2021-03-08 | Discharge: 2021-03-08 | Disposition: A | Discharge status: Home / Self Care | Payer: Federal, State, Local not specified - PPO | Source: Ambulatory Visit | Attending: Family Medicine | Admitting: Family Medicine

## 2021-03-08 ENCOUNTER — Ambulatory Visit: Payer: Federal, State, Local not specified - PPO | Attending: Plastic Surgery

## 2021-03-08 ENCOUNTER — Other Ambulatory Visit: Payer: Self-pay

## 2021-03-08 DIAGNOSIS — G8929 Other chronic pain: Secondary | ICD-10-CM

## 2021-03-08 DIAGNOSIS — M545 Low back pain, unspecified: Secondary | ICD-10-CM | POA: Diagnosis not present

## 2021-03-08 DIAGNOSIS — M25511 Pain in right shoulder: Secondary | ICD-10-CM

## 2021-03-08 DIAGNOSIS — M6281 Muscle weakness (generalized): Secondary | ICD-10-CM | POA: Diagnosis not present

## 2021-03-08 DIAGNOSIS — M25512 Pain in left shoulder: Secondary | ICD-10-CM | POA: Diagnosis not present

## 2021-03-08 DIAGNOSIS — M85852 Other specified disorders of bone density and structure, left thigh: Secondary | ICD-10-CM | POA: Diagnosis not present

## 2021-03-08 DIAGNOSIS — M25612 Stiffness of left shoulder, not elsewhere classified: Secondary | ICD-10-CM

## 2021-03-08 DIAGNOSIS — E2839 Other primary ovarian failure: Secondary | ICD-10-CM | POA: Diagnosis not present

## 2021-03-08 DIAGNOSIS — R293 Abnormal posture: Secondary | ICD-10-CM | POA: Insufficient documentation

## 2021-03-08 DIAGNOSIS — M25611 Stiffness of right shoulder, not elsewhere classified: Secondary | ICD-10-CM | POA: Diagnosis not present

## 2021-03-08 NOTE — Therapy (Signed)
[place] [place]. [place], [place], [address] Phone: [phone]   Fax:  [phone]  Physical Therapy Treatment  Patient Details  Name: Rhonda Morrow MRN: [id number] Date of Birth: 09/03/1961 Referring Provider (PT): Marla Roe Loel Lofty, DO   Encounter Date: 03/08/2021   PT End of Session - 03/08/21 1537    Visit Number 3    Number of Visits 9    Date for PT Re-Evaluation 03/28/21    Authorization Type BCBS    PT Start Time 1530    PT Stop Time 1613    PT Time Calculation (min) 43 min    Activity Tolerance Patient tolerated treatment well    Behavior During Therapy [facility] for tasks assessed/performed           Past Medical History:  Diagnosis Date  . Arthritis   . Asthma   . Complication of anesthesia    one time woke up and was vey scared,17 yrs ago  . Deaf   . Diabetes mellitus without complication ([place])   . GERD (gastroesophageal reflux disease)   . Hypertension   . Left groin pain   . Thyroid disease     Past Surgical History:  Procedure Laterality Date  . CARDIAC CATHETERIZATION    . CESAREAN SECTION    . CHOLECYSTECTOMY N/A 06/20/2016   Procedure: LAPAROSCOPIC CHOLECYSTECTOMY;  Surgeon: Rolm Bookbinder, MD;  Location: [place];  Service: General;  Laterality: N/A;  . ESOPHAGEAL MANOMETRY N/A 09/29/2013   Procedure: ESOPHAGEAL MANOMETRY (EM);  Surgeon: Milus Banister, MD;  Location: [facility];  Service: Endoscopy;  Laterality: N/A;  . TOTAL HIP ARTHROPLASTY Left 02/21/2017   Procedure: LEFT TOTAL HIP ARTHROPLASTY ANTERIOR APPROACH;  Surgeon: Leandrew Koyanagi, MD;  Location: [place];  Service: Orthopedics;  Laterality: Left;    There were no vitals filed for this visit.   Subjective Assessment - 03/08/21 1534    Subjective Has been busy. Saw Dr [name]. Continue PT another month.    Patient is accompained by: Interpreter   Juliann Pulse   Pertinent History Deaf, left THA, asthma, DM, arthritis, HTN, gout right  foot    Patient Stated Goals to get rid of pain , get back to PLOF    Currently in Pain? No/denies                             [facility] Adult PT Treatment/Exercise - 03/08/21 0001      Neck Exercises: Machines for Strengthening   Cybex Row 15# 10 x 2    Lat Pull 15# 10 x 2      Lumbar Exercises: Stretches   Double Knee to Chest Stretch --   10 reps with P ball   Lower Trunk Rotation --   x10 b   Lower Trunk Rotation Limitations x 10 on ball      Lumbar Exercises: Aerobic   UBE (Upper Arm Bike) L2 - 2 each way      Lumbar Exercises: Supine   Bridge 10 reps   2 sets   Other Supine Lumbar Exercises Supine march.  x10 B alt      Shoulder Exercises: Seated   Retraction Strengthening;Both;10 reps   x 2, redTB     Shoulder Exercises: Standing   Other Standing Exercises finger ladder x 5 B flexion          Standing shoulder ext 10# B 10 x 2  Doorway pec stretch at 60 deg 30" x 2 B  Passive HS stretch and ITB stretch x 30 sec bilateral        PT Short Term Goals - 03/08/21 1613      PT SHORT TERM GOAL #1   Title Independent with initial HEP    Time 2    Period Weeks    Status On-going      PT SHORT TERM GOAL #2   Title Pt will demo good understanding for body mechanics with ADLs, lifting    Time 4    Period Weeks    Status On-going             PT Long Term Goals - 01/31/21 1538      PT LONG TERM GOAL #1   Title decreased pain in bilateral shoulders with ADLS by 62% or more to normalize ADLS.    Time 8    Period Weeks    Status New    Target Date 03/28/21      PT LONG TERM GOAL #2   Title Improved B shoulder ROM to [facility] with </= 2/10 pain to facilitate ability to reach behind back/neck for grooming and dressing as well as household chores    Baseline Very limited and painful functional IR and ER    Time 8    Period Weeks    Status New    Target Date 03/28/21      PT LONG TERM GOAL #3   Title Pt will be able to lift various weighted  objects with good body mechanics and </= 2/10 pain to facilitate painfree work/home activities.    Time 8    Period Weeks    Status New    Target Date 03/28/21      PT LONG TERM GOAL #4   Title Pt able to sleep without waking from pain    Time 8    Period Weeks    Status New    Target Date 03/28/21                 Plan - 03/08/21 1612    Clinical Impression Statement Rhonda Morrow tolerated all exercises well no c/o incr pain. Alot of cues needed to prevent compensations with shoulder hiking during exercises. c/o some back stiffness after standing exercises but responded well to stretching end of session. Reinforced HEP for home with addition of doorway stretch    Personal Factors and Comorbidities Comorbidity 3+    Comorbidities DM, OSA, Hyperlipidemia, Restless leg syndrome, gout, HTN, Asthma, left THA, Deaf, Arthitis    Stability/Clinical Decision Making Evolving/Moderate complexity    Rehab Potential Good    PT Frequency 1x / week    PT Duration 8 weeks    PT Treatment/Interventions ADLs/Self Care Home Management;Cryotherapy;Electrical Stimulation;Iontophoresis 4mg /ml Dexamethasone;Moist Heat;Therapeutic exercise;Neuromuscular re-education;Patient/family education;Manual techniques;Dry needling;Taping;Functional mobility training;Therapeutic activities;Energy conservation    PT Next Visit Plan Progress B Shoulder Strength and ROM within pain limited ranges, anterior chest/shoulder and neck ms flexiblity, core/LE flexibility. Educate regarding postural strengthening and body mechanics. Manual and modalities as needed for pain and ms extensibility.    Consulted and Agree with Plan of Care Patient   Interpretor          Patient will benefit from skilled therapeutic intervention in order to improve the following deficits and impairments:  Pain,Decreased range of motion,Decreased strength,Impaired UE functional use,Decreased mobility,Postural dysfunction,Improper body mechanics,Impaired  flexibility  Visit Diagnosis: Acute pain of right shoulder  Stiffness of right  shoulder, not elsewhere classified  Chronic left shoulder pain  Stiffness of left shoulder, not elsewhere classified  Muscle weakness (generalized)  Abnormal posture  Chronic low back pain, unspecified back pain laterality, unspecified whether sciatica present     Problem List Patient Active Problem List   Diagnosis Date Noted  . Acute pain of left shoulder 01/24/2021  . Acute pain of right shoulder 01/24/2021  . Foot pain, bilateral 01/24/2021  . Dyspepsia 01/24/2021  . Preventative health care 01/24/2021  . Hand pain, right 12/29/2020  . OSA (obstructive sleep apnea) 09/12/2020  . Nocturia more than twice per night 08/03/2020  . Non-restorative sleep 08/03/2020  . Leg cramping 08/03/2020  . Hyperlipidemia associated with type 2 diabetes mellitus ([place]) 07/25/2020  . Large breasts 07/22/2020  . RLS (restless legs syndrome) 05/17/2020  . Cough 04/01/2020  . Moderate persistent asthma without complication 76/72/0947  . Medication management 04/01/2020  . Menopausal symptoms 03/29/2020  . Hot flashes 03/29/2020  . Chronic right shoulder pain 08/11/2019  . Acute non-recurrent maxillary sinusitis 08/11/2019  . BMI 40.0-44.9, adult ([place]) 01/22/2019  . Right lower quadrant abdominal pain 10/20/2018  . Rectal pain 10/20/2018  . Vitamin B 12 deficiency 03/28/2018  . B12 deficiency 03/28/2018  . DM (diabetes mellitus) type II uncontrolled, periph vascular disorder ([place]) 03/28/2018  . Hyperlipidemia 03/28/2018  . Primary osteoarthritis of left hip 02/21/2017  . 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 History of laparoscopic cholecystectomy 06/20/2016  . Cerumen impaction 06/09/2016  . Hypersomnia 05/16/2016  . Knee pain, right 05/05/2016  . RUQ pain 04/09/2016  . Abnormal CT scan 03/28/2016  . Dyspnea and respiratory abnormalities 03/15/2016  . Asthma with acute exacerbation  03/15/2016  . Asthma in adult 02/25/2016  . DM (diabetes mellitus) type II controlled, neurological manifestation ([place]) 02/09/2016  . Left hip pain 12/23/2015  . Right hamstring muscle strain 11/18/2015  . Abdominal pain, acute 09/01/2015  . Acute asthma exacerbation 04/16/2015  . Acute bronchitis 04/14/2015  . Pap smear for cervical cancer screening 09/14/2014  . Bed bug bite 05/06/2014  . Allergic rhinitis 04/09/2014  . Rectal itching 04/09/2014  . Bladder spasm 04/09/2014  . Knee pain, left 03/05/2014  . Hypokalemia 02/19/2014  . Nausea with vomiting 02/19/2014  . Diabetes mellitus, type II ([place]) 02/19/2014  . Edema 02/16/2014  . Disorder of rotator cuff 11/12/2013  . Routine general medical examination at a health care facility 08/20/2013  . GERD (gastroesophageal reflux disease) 06/26/2013  . Dysphagia, pharyngoesophageal phase 06/26/2013  . Medication side effect 03/25/2013  . Diarrhea 03/25/2013  . Benign positional vertigo 01/30/2013  . Traumatic hematoma of thigh 01/15/2013  . HTN (hypertension) 09/02/2012  . Dry skin 08/22/2012  . External hemorrhoids 08/22/2012  . Obesity 06/30/2012  . Thyromegaly 05/22/2012  . Back pain 05/22/2012  . Elevated glucose 05/22/2012  . Moderate persistent asthma 04/19/2012  . Dyspnea 01/28/2012    Hall Busing, PT, DPT 03/08/2021, 4:14 PM  [place]. [place], [place], [address] Phone: [phone]   Fax:  [phone]  Name: Rhonda Morrow MRN: [id number] Date of Birth: 09-Sep-1961

## 2021-03-10 ENCOUNTER — Ambulatory Visit: Payer: Federal, State, Local not specified - PPO | Admitting: Family Medicine

## 2021-03-10 ENCOUNTER — Other Ambulatory Visit: Payer: Self-pay | Admitting: Family Medicine

## 2021-03-10 DIAGNOSIS — E1165 Type 2 diabetes mellitus with hyperglycemia: Secondary | ICD-10-CM

## 2021-03-10 DIAGNOSIS — IMO0002 Reserved for concepts with insufficient information to code with codable children: Secondary | ICD-10-CM

## 2021-03-10 DIAGNOSIS — E1151 Type 2 diabetes mellitus with diabetic peripheral angiopathy without gangrene: Secondary | ICD-10-CM

## 2021-03-14 ENCOUNTER — Ambulatory Visit: Payer: Federal, State, Local not specified - PPO | Admitting: Family Medicine

## 2021-03-17 ENCOUNTER — Other Ambulatory Visit: Payer: Self-pay | Admitting: Medical

## 2021-03-19 ENCOUNTER — Other Ambulatory Visit: Payer: Self-pay | Admitting: Family Medicine

## 2021-03-19 DIAGNOSIS — G2581 Restless legs syndrome: Secondary | ICD-10-CM

## 2021-03-20 ENCOUNTER — Other Ambulatory Visit: Payer: Self-pay | Admitting: Family Medicine

## 2021-03-20 DIAGNOSIS — E119 Type 2 diabetes mellitus without complications: Secondary | ICD-10-CM

## 2021-03-20 DIAGNOSIS — I10 Essential (primary) hypertension: Secondary | ICD-10-CM

## 2021-03-21 ENCOUNTER — Ambulatory Visit: Payer: Federal, State, Local not specified - PPO

## 2021-03-21 ENCOUNTER — Telehealth: Payer: Self-pay | Admitting: Family Medicine

## 2021-03-21 NOTE — Telephone Encounter (Signed)
Patient brought in forms for [place] to sign  Please sign the yellow parts   Patient would like forms to be sent off and then to be called to let her know that it is done!

## 2021-03-22 ENCOUNTER — Other Ambulatory Visit: Payer: Self-pay

## 2021-03-22 ENCOUNTER — Ambulatory Visit (INDEPENDENT_AMBULATORY_CARE_PROVIDER_SITE_OTHER): Payer: Federal, State, Local not specified - PPO | Admitting: Family Medicine

## 2021-03-22 ENCOUNTER — Encounter: Payer: Self-pay | Admitting: Family Medicine

## 2021-03-22 VITALS — BP 124/80 | HR 74 | Temp 98.3°F | Resp 18 | Wt 237.0 lb

## 2021-03-22 DIAGNOSIS — IMO0002 Reserved for concepts with insufficient information to code with codable children: Secondary | ICD-10-CM

## 2021-03-22 DIAGNOSIS — K219 Gastro-esophageal reflux disease without esophagitis: Secondary | ICD-10-CM | POA: Diagnosis not present

## 2021-03-22 DIAGNOSIS — E1165 Type 2 diabetes mellitus with hyperglycemia: Secondary | ICD-10-CM

## 2021-03-22 DIAGNOSIS — E785 Hyperlipidemia, unspecified: Secondary | ICD-10-CM

## 2021-03-22 DIAGNOSIS — E1169 Type 2 diabetes mellitus with other specified complication: Secondary | ICD-10-CM

## 2021-03-22 DIAGNOSIS — E1151 Type 2 diabetes mellitus with diabetic peripheral angiopathy without gangrene: Secondary | ICD-10-CM | POA: Diagnosis not present

## 2021-03-22 MED ORDER — BLOOD GLUCOSE MONITOR KIT: PACK | 0 refills | Status: DC

## 2021-03-22 MED ORDER — HYDROXYZINE HCL 10 MG PO TABS: ORAL_TABLET | ORAL | 1 refills | Status: DC

## 2021-03-22 NOTE — Assessment & Plan Note (Signed)
Check labs today Glucometer rx sent to pharmacy con't metformin  F/u 3 months

## 2021-03-22 NOTE — Telephone Encounter (Signed)
Ok thank you 

## 2021-03-22 NOTE — Patient Instructions (Signed)
https://www.diabeteseducator.org/docs/default-source/living-with-diabetes/conquering-the-grocery-store-v1.pdf?sfvrsn=4">  Carbohydrate Counting for Diabetes Mellitus, Adult Carbohydrate counting is a method of keeping track of how many carbohydrates you eat. Eating carbohydrates naturally increases the amount of sugar (glucose) in the blood. Counting how many carbohydrates you eat improves your blood glucose control, which helps you manage your diabetes. It is important to know how many carbohydrates you can safely have in each meal. This is different for every person. A dietitian can help you make a meal plan and calculate how many carbohydrates you should have at each meal and snack. What foods contain carbohydrates? Carbohydrates are found in the following foods:  Grains, such as breads and cereals.  Dried beans and soy products.  Starchy vegetables, such as potatoes, peas, and corn.  Fruit and fruit juices.  Milk and yogurt.  Sweets and snack foods, such as cake, cookies, candy, chips, and soft drinks.   How do I count carbohydrates in foods? There are two ways to count carbohydrates in food. You can read food labels or learn standard serving sizes of foods. You can use either of the methods or a combination of both. Using the Nutrition Facts label The Nutrition Facts list is included on the labels of almost all packaged foods and beverages in the [place] It includes:  The serving size.  Information about nutrients in each serving, including the grams (g) of carbohydrate per serving. To use the Nutrition Facts:  Decide how many servings you will have.  Multiply the number of servings by the number of carbohydrates per serving.  The resulting number is the total amount of carbohydrates that you will be having. Learning the standard serving sizes of foods When you eat carbohydrate foods that are not packaged or do not include Nutrition Facts on the label, you need to measure the  servings in order to count the amount of carbohydrates.  Measure the foods that you will eat with a food scale or measuring cup, if needed.  Decide how many standard-size servings you will eat.  Multiply the number of servings by 15. For foods that contain carbohydrates, one serving equals 15 g of carbohydrates. ? For example, if you eat 2 cups or 10 oz (300 g) of strawberries, you will have eaten 2 servings and 30 g of carbohydrates (2 servings x 15 g = 30 g).  For foods that have more than one food mixed, such as soups and casseroles, you must count the carbohydrates in each food that is included. The following list contains standard serving sizes of common carbohydrate-rich foods. Each of these servings has about 15 g of carbohydrates:  1 slice of bread.  1 six-inch (15 cm) tortilla.  ? cup or 2 oz (53 g) cooked rice or pasta.   cup or 3 oz (85 g) cooked or canned, drained and rinsed beans or lentils.   cup or 3 oz (85 g) starchy vegetable, such as peas, corn, or squash.   cup or 4 oz (120 g) hot cereal.   cup or 3 oz (85 g) boiled or mashed potatoes, or  or 3 oz (85 g) of a large baked potato.   cup or 4 fl oz (118 mL) fruit juice.  1 cup or 8 fl oz (237 mL) milk.  1 small or 4 oz (106 g) apple.   or 2 oz (63 g) of a medium banana.  1 cup or 5 oz (150 g) strawberries.  3 cups or 1 oz (24 g) popped popcorn. What is an example of   carbohydrate counting? To calculate the number of carbohydrates in this sample meal, follow the steps shown below. Sample meal  3 oz (85 g) chicken breast.  ? cup or 4 oz (106 g) [place] rice.   cup or 3 oz (85 g) corn.  1 cup or 8 fl oz (237 mL) milk.  1 cup or 5 oz (150 g) strawberries with sugar-free whipped topping. Carbohydrate calculation 1. Identify the foods that contain carbohydrates: ? Rice. ? Corn. ? Milk. ? Strawberries. 2. Calculate how many servings you have of each food: ? 2 servings rice. ? 1 serving  corn. ? 1 serving milk. ? 1 serving strawberries. 3. Multiply each number of servings by 15 g: ? 2 servings rice x 15 g = 30 g. ? 1 serving corn x 15 g = 15 g. ? 1 serving milk x 15 g = 15 g. ? 1 serving strawberries x 15 g = 15 g. 4. Add together all of the amounts to find the total grams of carbohydrates eaten: ? 30 g + 15 g + 15 g + 15 g = 75 g of carbohydrates total. What are tips for following this plan? Shopping  Develop a meal plan and then make a shopping list.  Buy fresh and frozen vegetables, fresh and frozen fruit, dairy, eggs, beans, lentils, and whole grains.  Look at food labels. Choose foods that have more fiber and less sugar.  Avoid processed foods and foods with added sugars. Meal planning  Aim to have the same amount of carbohydrates at each meal and for each snack time.  Plan to have regular, balanced meals and snacks. Where to find more information  American Diabetes Association: www.diabetes.org  Centers for Disease Control and Prevention: http://www.wolf.info/ Summary  Carbohydrate counting is a method of keeping track of how many carbohydrates you eat.  Eating carbohydrates naturally increases the amount of sugar (glucose) in the blood.  Counting how many carbohydrates you eat improves your blood glucose control, which helps you manage your diabetes.  A dietitian can help you make a meal plan and calculate how many carbohydrates you should have at each meal and snack. This information is not intended to replace advice given to you by your health care provider. Make sure you discuss any questions you have with your health care provider. Document Revised: 10/23/2019 Document Reviewed: 10/24/2019 Elsevier Patient Education  2021 [place] for Gastroesophageal Reflux Disease, Adult When you have gastroesophageal reflux disease (GERD), the foods you eat and your eating habits are very important. Choosing the right foods can help ease your  discomfort. Think about working with a food expert (dietitian) to help you make good choices. What are tips for following this plan? Reading food labels  Look for foods that are low in saturated fat. Foods that may help with your symptoms include: ? Foods that have less than 5% of daily value (DV) of fat. ? Foods that have 0 grams of trans fat. Cooking  Do not fry your food.  Cook your food by baking, steaming, grilling, or broiling. These are all methods that do not need a lot of fat for cooking.  To add flavor, try to use herbs that are low in spice and acidity. Meal planning  Choose healthy foods that are low in fat, such as: ? Fruits and vegetables. ? Whole grains. ? Low-fat dairy products. ? Lean meats, fish, and poultry.  Eat small meals often instead of eating 3 large meals each day.  Eat your meals slowly in a place where you are relaxed. Avoid bending over or lying down until 2-3 hours after eating.  Limit high-fat foods such as fatty meats or fried foods.  Limit your intake of fatty foods, such as oils, butter, and shortening.  Avoid the following as told by your doctor: ? Foods that cause symptoms. These may be different for different people. Keep a food diary to keep track of foods that cause symptoms. ? Alcohol. ? Drinking a lot of liquid with meals. ? Eating meals during the 2-3 hours before bed.   Lifestyle  Stay at a healthy weight. Ask your doctor what weight is healthy for you. If you need to lose weight, work with your doctor to do so safely.  Exercise for at least 30 minutes on 5 or more days each week, or as told by your doctor.  Wear loose-fitting clothes.  Do not smoke or use any products that contain nicotine or tobacco. If you need help quitting, ask your doctor.  Sleep with the head of your bed higher than your feet. Use a wedge under the mattress or blocks under the bed frame to raise the head of the bed.  Chew sugar-free gum after meals. What  foods should eat? Eat a healthy, well-balanced diet of fruits, vegetables, whole grains, low-fat dairy products, lean meats, fish, and poultry. Each person is different. Foods that may cause symptoms in one person may not cause any symptoms in another person. Work with your doctor to find foods that are safe for you. The items listed above may not be a complete list of what you can eat and drink. Contact a food expert for more options.   What foods should I avoid? Limiting some of these foods may help in managing the symptoms of GERD. Everyone is different. Talk with a food expert or your doctor to help you find the exact foods to avoid, if any. Fruits Any fruits prepared with added fat. Any fruits that cause symptoms. For some people, this may include citrus fruits, such as oranges, grapefruit, pineapple, and lemons. Vegetables Deep-fried vegetables. [place] fries. Any vegetables prepared with added fat. Any vegetables that cause symptoms. For some people, this may include tomatoes and tomato products, chili peppers, onions and garlic, and horseradish. Grains Pastries or quick breads with added fat. Meats and other proteins High-fat meats, such as fatty beef or pork, hot dogs, ribs, ham, sausage, salami, and bacon. Fried meat or protein, including fried fish and fried chicken. Nuts and nut butters, in large amounts. Dairy Whole milk and chocolate milk. Sour cream. Cream. Ice cream. Cream cheese. Milkshakes. Fats and oils Butter. Margarine. Shortening. Ghee. Beverages Coffee and tea, with or without caffeine. Carbonated beverages. Sodas. Energy drinks. Fruit juice made with acidic fruits, such as orange or grapefruit. Tomato juice. Alcoholic drinks. Sweets and desserts Chocolate and cocoa. Donuts. Seasonings and condiments Pepper. Peppermint and spearmint. Added salt. Any condiments, herbs, or seasonings that cause symptoms. For some people, this may include curry, hot sauce, or vinegar-based  salad dressings. The items listed above may not be a complete list of what you should not eat and drink. Contact a food expert for more options. Questions to ask your doctor Diet and lifestyle changes are often the first steps that are taken to manage symptoms of GERD. If diet and lifestyle changes do not help, talk with your doctor about taking medicines. Where to find more information  International Foundation for Gastrointestinal Disorders: aboutgerd.org Summary  When you have GERD, food and lifestyle choices are very important in easing your symptoms.  Eat small meals often instead of 3 large meals a day. Eat your meals slowly and in a place where you are relaxed.  Avoid bending over or lying down until 2-3 hours after eating.  Limit high-fat foods such as fatty meats or fried foods. This information is not intended to replace advice given to you by your health care provider. Make sure you discuss any questions you have with your health care provider. Document Revised: 05/03/2020 Document Reviewed: 05/03/2020 Elsevier Patient Education  [place].

## 2021-03-22 NOTE — Telephone Encounter (Signed)
Do I need to do anything with the forms you put In my office?

## 2021-03-22 NOTE — Telephone Encounter (Signed)
The form that was brought in basically said that their was missing sheets from the 02/15/21 forms. I tied calling to make sure [employer] has received the 03/07/21 forms we sent over, but she could not verify d/t the companies privacy policy.   So I re-faxed the 03/07/21 documents, and if anything is missing they will let Rhonda Morrow know.   Pt has a 4:20 pm appointment today

## 2021-03-22 NOTE — Progress Notes (Signed)
Subjective:   By signing my name below, I, Shehryar Baig, attest that this documentation has been prepared under the direction and in the presence of Dr. [name], DO. 03/22/2021      Patient ID: Rhonda Morrow, female    DOB: [birth year], 60 y.o.   MRN: [id number]  Chief Complaint  Patient presents with  . 2 Week Follow-up    Concerns/questions: needs refill on Hydroxyzine -tlc,rma    HPI Patient is in today for a office visit. She has brought her translator with her during this visit to assist her with communication. Her blood sugars level during her last visit was elevated. She is taking 500 mg metformin 2 daily PO to manage her blood sugar. She did not take her 500 mg metformin today. She complains of bloating and stomach pain after taking 1 mg sucralfate before her meals. She reports her symptoms improving after she stopped taking the 1 mg sucralfate before meals. She complains of feeling a lump in her stomach that causes her food to get stuck and causes her pain. The omeprazole is helping the pain.   She denies having any fever, ear pain, congestion, sinus pain, sore throat, eye pain, chest pain, palpations, cough, shortness of breath, wheezing, nausea, vomiting, diarrhea, constipation, blood in stool, dysuria, frequency, hematuria, dizziness, or headaches at this time.    Past Medical History:  Diagnosis Date  . Arthritis   . Asthma   . Complication of anesthesia    one time woke up and was vey scared,17 yrs ago  . Deaf   . Diabetes mellitus without complication ([place])   . GERD (gastroesophageal reflux disease)   . Hypertension   . Left groin pain   . Thyroid disease     Past Surgical History:  Procedure Laterality Date  . CARDIAC CATHETERIZATION    . CESAREAN SECTION    . CHOLECYSTECTOMY N/A 06/20/2016   Procedure: LAPAROSCOPIC CHOLECYSTECTOMY;  Surgeon: Rolm Bookbinder, MD;  Location: [facility];  Service: General;  Laterality: N/A;  . ESOPHAGEAL MANOMETRY N/A  09/29/2013   Procedure: ESOPHAGEAL MANOMETRY (EM);  Surgeon: Milus Banister, MD;  Location: [facility];  Service: Endoscopy;  Laterality: N/A;  . TOTAL HIP ARTHROPLASTY Left 02/21/2017   Procedure: LEFT TOTAL HIP ARTHROPLASTY ANTERIOR APPROACH;  Surgeon: Leandrew Koyanagi, MD;  Location: [facility];  Service: Orthopedics;  Laterality: Left;    Family History  Problem Relation Age of Onset  . Diabetes Mother   . Heart disease Mother   . Diabetes Father     Social History   Socioeconomic History  . Marital status: Married    Spouse name: Not on file  . Number of children: Not on file  . Years of education: Not on file  . Highest education level: Not on file  Occupational History  . Occupation: disabled  Tobacco Use  . Smoking status: Never Smoker  . Smokeless tobacco: Never Used  Vaping Use  . Vaping Use: Never used  Substance and Sexual Activity  . Alcohol use: No    Alcohol/week: 0.0 standard drinks  . Drug use: No  . Sexual activity: Never  Other Topics Concern  . Not on file  Social History Narrative   Lives with family.  Has 3 children.  Works for [employer].  Education: high school.   Social Determinants of Health   Financial Resource Strain: Not on file  Food Insecurity: Not on file  Transportation Needs: Not on file  Physical Activity: Not  on file  Stress: Not on file  Social Connections: Not on file  Intimate Partner Violence: Not on file    Outpatient Medications Prior to Visit  Medication Sig Dispense Refill  . amLODipine (NORVASC) 5 MG tablet Take 1 tablet (5 mg total) by mouth daily. 90 tablet 1  . ONETOUCH VERIO test strip USE AS INSTRUCTED--- ONE TOUCH VERIO TEST STRIPS 100 strip 12  . albuterol (VENTOLIN HFA) 108 (90 Base) MCG/ACT inhaler Inhale 1-2 puffs into the lungs every 6 (six) hours as needed for wheezing or shortness of breath. 8 g 6  . allopurinol (ZYLOPRIM) 100 MG tablet Take 1 tablet (100 mg total) by mouth daily. 90 tablet 3  . aspirin EC 325 MG  tablet Take 1 tablet (325 mg total) by mouth 2 (two) times daily. 84 tablet 0  . azelastine (ASTELIN) 0.1 % nasal spray Place 2 sprays into both nostrils 2 (two) times daily. Use in each nostril as directed 30 mL 3  . blood glucose meter kit and supplies KIT Dispense based on patient and insurance preference. Use up to four times daily as directed. (FOR ICD-9 250.00, 250.01). 1 each 0  . Calcium Carbonate-Vit D-Min (CALCIUM 1200) 1200-1000 MG-UNIT CHEW 1 po qd 30 tablet 11  . cetirizine (ZYRTEC) 10 MG tablet TAKE 1 TABLET BY MOUTH EVERY DAY 90 tablet 1  . colchicine 0.6 MG tablet Take 1 tablet (0.6 mg total) by mouth daily. 90 tablet 1  . COVID-19 mRNA vaccine, Pfizer, 30 MCG/0.3ML injection INJECT AS DIRECTED .3 mL 0  . Cyanocobalamin (B-12) 1000 MCG SUBL Dissolove 1 tablet under tongue once a day 90 tablet 1  . diclofenac (VOLTAREN) 75 MG EC tablet Take 1 tablet (75 mg total) by mouth 2 (two) times daily as needed. 60 tablet 3  . diclofenac sodium (VOLTAREN) 1 % GEL Apply 4 g topically 4 (four) times daily as needed. 500 g 6  . escitalopram (LEXAPRO) 10 MG tablet TAKE 1 TABLET BY MOUTH EVERYDAY AT BEDTIME 90 tablet 1  . famotidine (PEPCID) 20 MG tablet TAKE 1 TABLET BY MOUTH EVERY DAY *NOT COVERED 30 tablet 0  . Fiber CHEW Chew 1 tablet by mouth daily.    . fluticasone (FLONASE) 50 MCG/ACT nasal spray Place 2 sprays into both nostrils daily. 16 g 6  . fluticasone (FLONASE) 50 MCG/ACT nasal spray SPRAY 2 SPRAYS INTO EACH NOSTRIL EVERY DAY 48 mL 1  . hydrochlorothiazide (HYDRODIURIL) 12.5 MG tablet Take 1 tablet (12.5 mg total) by mouth daily. 90 tablet 1  . ipratropium-albuterol (DUONEB) 0.5-2.5 (3) MG/3ML SOLN Take 3 mLs by nebulization every 6 (six) hours as needed. 360 mL 3  . Lancets (ONETOUCH ULTRASOFT) lancets Use as instructed 100 each 12  . meclizine (ANTIVERT) 12.5 MG tablet Take 1 tablet (12.5 mg total) by mouth 3 (three) times daily as needed for dizziness. 30 tablet 0  . metaxalone  (SKELAXIN) 800 MG tablet Take 1 tablet (800 mg total) by mouth 3 (three) times daily. 30 tablet 2  . metFORMIN (GLUCOPHAGE-XR) 500 MG 24 hr tablet TAKE 2 TABLET BY MOUTH EVERY DAY WITH BREAKFAST 60 tablet 1  . montelukast (SINGULAIR) 10 MG tablet Take 1 tablet (10 mg total) by mouth at bedtime. 90 tablet 3  . Multiple Vitamin (MULTIVITAMIN) tablet Take 1 tablet by mouth daily.    . NONFORMULARY OR COMPOUNDED ITEM Nebulizer   DX ASTHMA 1 each 0  . nystatin-triamcinolone (MYCOLOG II) cream Apply twice daily to lower chest area/upper  abdomen 30 g 0  . ofloxacin (FLOXIN OTIC) 0.3 % OTIC solution Place 10 drops into the right ear daily. 10 mL 0  . omeprazole (PRILOSEC) 20 MG capsule Take 1 capsule (20 mg total) by mouth daily. 90 capsule 3  . omeprazole (PRILOSEC) 20 MG capsule Take 1 capsule (20 mg total) by mouth daily. 30 capsule 3  . ondansetron (ZOFRAN ODT) 4 MG disintegrating tablet Take 1 tablet (4 mg total) by mouth every 8 (eight) hours as needed for nausea or vomiting. 20 tablet 0  . potassium chloride SA (KLOR-CON M20) 20 MEQ tablet Take 1 tablet (20 mEq total) by mouth daily. 90 tablet 1  . promethazine-dextromethorphan (PROMETHAZINE-DM) 6.25-15 MG/5ML syrup Take 5 mLs by mouth 4 (four) times daily as needed. 118 mL 0  . rOPINIRole (REQUIP) 0.25 MG tablet TAKE 1 TABLET BY MOUTH AT BEDTIME FOR 4 DAYS THEN INCREASE TO 2 TABLETS AT BEDTIME IF NEEDED 180 tablet 1  . rosuvastatin (CRESTOR) 10 MG tablet Take 1 tablet (10 mg total) by mouth daily. 30 tablet 2  . Spacer/Aero-Holding Chambers (AEROCHAMBER MV) inhaler Use as instructed 1 each 0  . SYMBICORT 160-4.5 MCG/ACT inhaler Inhale 2 puffs into the lungs every 12 (twelve) hours. 1 each 8  . traMADol (ULTRAM) 50 MG tablet TAKE 1 TABLET (50 MG TOTAL) BY MOUTH EVERY 8 (EIGHT) HOURS AS NEEDED FOR UP TO 5 DAYS. 16 tablet 0  . triamcinolone cream (KENALOG) 0.1 % Apply bid to hands 30 g 0  . hydrOXYzine (ATARAX/VISTARIL) 10 MG tablet TAKE 1 TO 2  TABLETS BY MOUTH AT BEDTIME AS NEEDED FOR ITCHING 180 tablet 1  . sucralfate (CARAFATE) 1 g tablet 1 tab po tid before meals. 90 tablet 0   Facility-Administered Medications Prior to Visit  Medication Dose Route Frequency Provider Last Rate Last Admin  . alum & mag hydroxide-simeth (MAALOX/MYLANTA) 200-200-20 MG/5ML suspension 30 mL  30 mL Oral Once Saguier, Edward, PA-C      . hyoscyamine (LEVSIN SL) SL tablet 0.125 mg  0.125 mg Sublingual Once Saguier, Edward, PA-C      . lidocaine (XYLOCAINE) 2 % viscous mouth solution 15 mL  15 mL Mouth/Throat Once Saguier, Percell Miller, PA-C        Allergies  Allergen Reactions  . Losartan Shortness Of Breath  . Oxycodone-Acetaminophen Nausea And Vomiting  . Augmentin [Amoxicillin-Pot Clavulanate] Diarrhea    Review of Systems  Constitutional: Negative for fever.  HENT: Negative for congestion, ear pain, sinus pain and sore throat.   Eyes: Negative for pain.  Respiratory: Negative for cough, shortness of breath and wheezing.   Cardiovascular: Negative for chest pain and palpitations.  Gastrointestinal: Positive for abdominal pain (mid epigastric). Negative for blood in stool, constipation, diarrhea, nausea and vomiting.  Genitourinary: Negative for dysuria, frequency and hematuria.  Neurological: Negative for dizziness and headaches.       Objective:    Physical Exam Constitutional:      Appearance: Normal appearance.  HENT:     Head: Normocephalic and atraumatic.     Right Ear: External ear normal.     Left Ear: External ear normal.  Eyes:     Extraocular Movements: Extraocular movements intact.     Pupils: Pupils are equal, round, and reactive to light.  Cardiovascular:     Rate and Rhythm: Normal rate and regular rhythm.     Pulses: Normal pulses.     Heart sounds: Normal heart sounds. No murmur heard. No friction rub. No gallop.  Pulmonary:     Effort: Pulmonary effort is normal. No respiratory distress.     Breath sounds: Normal  breath sounds. No stridor. No wheezing, rhonchi or rales.  Abdominal:     Tenderness: There is abdominal tenderness (Mild) in the epigastric area.     Comments: Mid epigastric pain  Skin:    General: Skin is warm and dry.  Neurological:     Mental Status: She is alert and oriented to person, place, and time.  Psychiatric:        Behavior: Behavior normal.     BP 124/80 (BP Location: Left Arm, Patient Position: Sitting, Cuff Size: Large)   Pulse 74   Temp 98.3 F (36.8 C) (Oral)   Resp 18   Wt 237 lb (107.5 kg)   LMP 05/20/2012   SpO2 97%   BMI 40.68 kg/m  Wt Readings from Last 3 Encounters:  03/22/21 237 lb (107.5 kg)  03/07/21 236 lb 3.2 oz (107.1 kg)  02/22/21 235 lb (106.6 kg)    Diabetic Foot Exam - Simple   No data filed    Lab Results  Component Value Date   WBC 4.8 02/22/2021   HGB 13.9 02/22/2021   HCT 42.5 02/22/2021   PLT 138.0 (L) 02/22/2021   GLUCOSE 207 (H) 02/22/2021   CHOL 178 12/28/2020   TRIG 71.0 12/28/2020   HDL 62.60 12/28/2020   LDLCALC 101 (H) 12/28/2020   ALT 25 02/22/2021   AST 22 02/22/2021   NA 137 02/22/2021   K 3.6 02/22/2021   CL 99 02/22/2021   CREATININE 0.96 02/22/2021   BUN 11 02/22/2021   CO2 30 02/22/2021   TSH 0.74 04/20/2020   INR 1.05 02/12/2017   HGBA1C 7.2 (H) 12/28/2020   MICROALBUR 0.5 07/22/2020    Lab Results  Component Value Date   TSH 0.74 04/20/2020   Lab Results  Component Value Date   WBC 4.8 02/22/2021   HGB 13.9 02/22/2021   HCT 42.5 02/22/2021   MCV 84.1 02/22/2021   PLT 138.0 (L) 02/22/2021   Lab Results  Component Value Date   NA 137 02/22/2021   K 3.6 02/22/2021   CO2 30 02/22/2021   GLUCOSE 207 (H) 02/22/2021   BUN 11 02/22/2021   CREATININE 0.96 02/22/2021   BILITOT 0.5 02/22/2021   ALKPHOS 55 02/22/2021   AST 22 02/22/2021   ALT 25 02/22/2021   PROT 6.4 02/22/2021   ALBUMIN 3.5 02/22/2021   CALCIUM 9.0 02/22/2021   ANIONGAP 13 05/24/2020   GFR 65.70 02/22/2021   Lab  Results  Component Value Date   CHOL 178 12/28/2020   Lab Results  Component Value Date   HDL 62.60 12/28/2020   Lab Results  Component Value Date   LDLCALC 101 (H) 12/28/2020   Lab Results  Component Value Date   TRIG 71.0 12/28/2020   Lab Results  Component Value Date   CHOLHDL 3 12/28/2020   Lab Results  Component Value Date   HGBA1C 7.2 (H) 12/28/2020       Assessment & Plan:   Problem List Items Addressed This Visit      Unprioritized   Diabetes mellitus, type II ([place]) - Primary   Relevant Medications   blood glucose meter kit and supplies KIT   Other Relevant Orders   Microalbumin / creatinine urine ratio   Hemoglobin A1c   Lipid panel   Comprehensive metabolic panel   DM (diabetes mellitus) type II uncontrolled, periph vascular disorder ([place])  Check labs today Glucometer rx sent to pharmacy con't metformin  F/u 3 months      Gastroesophageal reflux disease    con't omeprazole Pt would like to hold off on gi referral  If pain does not subside or worsens she will let us know       Hyperlipidemia associated with type 2 diabetes mellitus ([place])   Relevant Orders   Microalbumin / creatinine urine ratio   Hemoglobin A1c   Lipid panel   Comprehensive metabolic panel       Meds ordered this encounter  Medications  . blood glucose meter kit and supplies KIT    Sig: Dispense based on patient and insurance preference. Use up to four times daily as directed.    Dispense:  1 each    Refill:  0    Order Specific Question:   Number of strips    Answer:   100    Order Specific Question:   Number of lancets    Answer:   1000  . hydrOXYzine (ATARAX/VISTARIL) 10 MG tablet    Sig: 1-2 tab po qhs prn    Dispense:  180 tablet    Refill:  1    I, Dr. [name], DO, personally preformed the services described in this documentation.  All medical record entries made by the scribe were at my direction and in my presence.  I have reviewed the chart  and discharge instructions (if applicable) and agree that the record reflects my personal performance and is accurate and complete. 03/22/2021   I,Shehryar Baig,acting as a scribe for Ann Held, DO.,have documented all relevant documentation on the behalf of Ann Held, DO,as directed by  Ann Held, DO while in the presence of Ann Held, DO.   Ann Held, DO

## 2021-03-22 NOTE — Telephone Encounter (Signed)
No ma'am. Just to return to the pt when she comes this evening.

## 2021-03-22 NOTE — Assessment & Plan Note (Signed)
con't omeprazole Pt would like to hold off on gi referral  If pain does not subside or worsens she will let us know

## 2021-03-22 NOTE — Telephone Encounter (Signed)
Telephone message created.

## 2021-03-23 LAB — LIPID PANEL
Cholesterol: 137 mg/dL (ref 0–200)
HDL: 54.7 mg/dL (ref >39.00)
LDL Cholesterol: 71 mg/dL (ref 0–99)
NonHDL: 82.14
Total CHOL/HDL Ratio: 3
Triglycerides: 58 mg/dL (ref 0.0–149.0)
VLDL: 11.6 mg/dL (ref 0.0–40.0)

## 2021-03-23 LAB — COMPREHENSIVE METABOLIC PANEL
ALT: 17 U/L (ref 0–35)
AST: 15 U/L (ref 0–37)
Albumin: 3.8 g/dL (ref 3.5–5.2)
Alkaline Phosphatase: 64 U/L (ref 39–117)
BUN: 12 mg/dL (ref 6–23)
CO2: 31 mEq/L (ref 19–32)
Calcium: 9.5 mg/dL (ref 8.4–10.5)
Chloride: 103 mEq/L (ref 96–112)
Creatinine, Ser: 0.85 mg/dL (ref 0.40–1.20)
GFR: 75.99 mL/min (ref >60.00)
Glucose, Bld: 143 mg/dL — ABNORMAL HIGH (ref 70–99)
Potassium: 3.5 mEq/L (ref 3.5–5.1)
Sodium: 141 mEq/L (ref 135–145)
Total Bilirubin: 0.4 mg/dL (ref 0.2–1.2)
Total Protein: 7 g/dL (ref 6.0–8.3)

## 2021-03-23 LAB — MICROALBUMIN / CREATININE URINE RATIO
Creatinine,U: 218.2 mg/dL
Microalb Creat Ratio: 1.2 mg/g (ref 0.0–30.0)
Microalb, Ur: 2.6 mg/dL — ABNORMAL HIGH (ref 0.0–1.9)

## 2021-03-23 LAB — HEMOGLOBIN A1C: Hgb A1c MFr Bld: 7.3 % — ABNORMAL HIGH (ref 4.6–6.5)

## 2021-03-24 ENCOUNTER — Other Ambulatory Visit: Payer: Self-pay | Admitting: Family Medicine

## 2021-03-24 DIAGNOSIS — E1169 Type 2 diabetes mellitus with other specified complication: Secondary | ICD-10-CM

## 2021-03-24 DIAGNOSIS — E1165 Type 2 diabetes mellitus with hyperglycemia: Secondary | ICD-10-CM

## 2021-03-24 DIAGNOSIS — I10 Essential (primary) hypertension: Secondary | ICD-10-CM

## 2021-03-25 ENCOUNTER — Telehealth: Payer: Self-pay | Admitting: Family Medicine

## 2021-03-25 NOTE — Telephone Encounter (Signed)
Patient states she would like referral for her ulcers

## 2021-03-28 ENCOUNTER — Encounter: Payer: Self-pay | Admitting: Podiatry

## 2021-03-28 ENCOUNTER — Other Ambulatory Visit: Payer: Self-pay

## 2021-03-28 ENCOUNTER — Other Ambulatory Visit: Payer: Self-pay | Admitting: Family Medicine

## 2021-03-28 ENCOUNTER — Ambulatory Visit: Payer: Federal, State, Local not specified - PPO | Admitting: Family Medicine

## 2021-03-28 ENCOUNTER — Ambulatory Visit (INDEPENDENT_AMBULATORY_CARE_PROVIDER_SITE_OTHER): Payer: Federal, State, Local not specified - PPO | Admitting: Podiatry

## 2021-03-28 DIAGNOSIS — R1013 Epigastric pain: Secondary | ICD-10-CM

## 2021-03-28 DIAGNOSIS — M7661 Achilles tendinitis, right leg: Secondary | ICD-10-CM | POA: Diagnosis not present

## 2021-03-28 DIAGNOSIS — M7662 Achilles tendinitis, left leg: Secondary | ICD-10-CM

## 2021-03-28 MED ORDER — NITROGLYCERIN 0.1 MG/HR TD PT24: 0.2000 mg | MEDICATED_PATCH | Freq: Every day | TRANSDERMAL | Status: AC

## 2021-03-28 MED ORDER — DICLOFENAC SODIUM 75 MG PO TBEC: 75.0000 mg | DELAYED_RELEASE_TABLET | Freq: Two times a day (BID) | ORAL | 2 refills | Status: DC

## 2021-03-28 NOTE — Progress Notes (Signed)
Subjective:   Patient ID: Rhonda Morrow, female   DOB: 60 y.o.   MRN: [id number]   HPI Patient has developed a lot of pain again in the back of the left heel several months duration and does work on cement floors with moderate obesity is complicating factor.  States she has pain and she needs to work and has trouble with this    ROS      Objective:  Physical Exam  Neurovascular status intact with patient found to have equinus condition bilateral quite a bit of pain more in the muscle tendon junction left with distal pain that we worked on previously which is present but better with moderate flatfoot deformity     Assessment:  Patient has moderate obesity who has what appears to be muscle tenderness inflammation Achilles left with equinus is complicating factor and flatfoot deformity     Plan:  H&P worked with interpreter educating them on condition and treatment options.  At this point we will try nitroglycerin patches back of the left muscle tendon junction along with oral anti-inflammatory and I did cast for orthotics to lift up the plantar heel and take pressure off the posterior Achilles.  I educated her on the usage of this and patient will be seen back when returned and will use her air fracture walker that she has and may require physical therapy possible MRI or other treatments if symptoms persist

## 2021-03-28 NOTE — Telephone Encounter (Signed)
Gi referral placed  

## 2021-03-29 ENCOUNTER — Ambulatory Visit: Payer: Federal, State, Local not specified - PPO

## 2021-04-01 ENCOUNTER — Other Ambulatory Visit: Payer: Self-pay | Admitting: Family Medicine

## 2021-04-05 ENCOUNTER — Other Ambulatory Visit (INDEPENDENT_AMBULATORY_CARE_PROVIDER_SITE_OTHER): Payer: Federal, State, Local not specified - PPO

## 2021-04-05 ENCOUNTER — Other Ambulatory Visit: Payer: Self-pay

## 2021-04-05 ENCOUNTER — Ambulatory Visit: Payer: Federal, State, Local not specified - PPO | Admitting: Physical Therapy

## 2021-04-05 ENCOUNTER — Other Ambulatory Visit: Payer: Self-pay | Admitting: Family Medicine

## 2021-04-05 DIAGNOSIS — E1165 Type 2 diabetes mellitus with hyperglycemia: Secondary | ICD-10-CM | POA: Diagnosis not present

## 2021-04-05 DIAGNOSIS — E1169 Type 2 diabetes mellitus with other specified complication: Secondary | ICD-10-CM

## 2021-04-05 DIAGNOSIS — E785 Hyperlipidemia, unspecified: Secondary | ICD-10-CM | POA: Diagnosis not present

## 2021-04-05 DIAGNOSIS — I10 Essential (primary) hypertension: Secondary | ICD-10-CM

## 2021-04-05 LAB — COMPREHENSIVE METABOLIC PANEL
ALT: 18 U/L (ref 0–35)
AST: 15 U/L (ref 0–37)
Albumin: 3.7 g/dL (ref 3.5–5.2)
Alkaline Phosphatase: 80 U/L (ref 39–117)
BUN: 13 mg/dL (ref 6–23)
CO2: 30 mEq/L (ref 19–32)
Calcium: 9.5 mg/dL (ref 8.4–10.5)
Chloride: 102 mEq/L (ref 96–112)
Creatinine, Ser: 0.81 mg/dL (ref 0.40–1.20)
GFR: 80.5 mL/min (ref >60.00)
Glucose, Bld: 168 mg/dL — ABNORMAL HIGH (ref 70–99)
Potassium: 3.4 mEq/L — ABNORMAL LOW (ref 3.5–5.1)
Sodium: 141 mEq/L (ref 135–145)
Total Bilirubin: 0.3 mg/dL (ref 0.2–1.2)
Total Protein: 6.3 g/dL (ref 6.0–8.3)

## 2021-04-05 LAB — HEMOGLOBIN A1C: Hgb A1c MFr Bld: 7.2 % — ABNORMAL HIGH (ref 4.6–6.5)

## 2021-04-05 LAB — LIPID PANEL
Cholesterol: 145 mg/dL (ref 0–200)
HDL: 54.6 mg/dL (ref >39.00)
LDL Cholesterol: 71 mg/dL (ref 0–99)
NonHDL: 90.17
Total CHOL/HDL Ratio: 3
Triglycerides: 96 mg/dL (ref 0.0–149.0)
VLDL: 19.2 mg/dL (ref 0.0–40.0)

## 2021-04-05 NOTE — Telephone Encounter (Signed)
Patient came in and dropped off same forms to be corrected   Patient needs papers done by June 4th   Papers were given to [facility]  Patient would like to be called when finished

## 2021-04-08 NOTE — Progress Notes (Signed)
I, Peterson Lombard, LAT, ATC acting as a scribe for Lynne Leader, MD.  Rhonda Morrow is a 60 y.o. female who presents to [place] at [facility] today for f/u bilat shoulder pain, L>R. 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 Pt was last seen by Dr. [name] on 03/07/21 and was referred again to PT, of which she has completed 4 visit. Today, pt reports she is going to go once a week for 3 weeks for PT. Pt notes shoulder pain is a little better and has been very busy.  Dx imaging: 01/24/21 R & L shoulder XR  Pertinent review of systems: No fevers or chills  Relevant historical information: Hypertension diabetes   Exam:  BP 131/85 (BP Location: Right Arm, Patient Position: Sitting, Cuff Size: Normal)   Pulse 77   Ht 5\' 4"  (1.626 m)   Wt 236 lb 12.8 oz (107.4 kg)   LMP 05/20/2012   SpO2 98%   BMI 40.65 kg/m  General: Well Developed, well nourished, and in no acute distress.   MSK: Left shoulder normal-appearing Normal motion pain with abduction.  Strength reduced abduction with pain.    Lab and Radiology Results  Procedure: Real-time Ultrasound Guided Injection of left shoulder subacromial bursa Device: Philips Affiniti 50G Images permanently stored and available for review in PACS Verbal informed consent obtained.  Discussed risks and benefits of procedure. Warned about infection bleeding damage to structures skin hypopigmentation and fat atrophy among others. Patient expresses understanding and agreement Time-out conducted.   Noted no overlying erythema, induration, or other signs of local infection.   Skin prepped in a sterile fashion.   Local anesthesia: Topical Ethyl chloride.   With sterile technique and under real time ultrasound guidance:  40 mg of Kenalog and 2 mL of Marcaine injected into subacromial bursa. Fluid seen entering the bursa.   Completed without difficulty   Pain immediately resolved suggesting accurate placement of the  medication.   Advised to call if fevers/chills, erythema, induration, drainage, or persistent bleeding.   Images permanently stored and available for review in the ultrasound unit.  Impression: Technically successful ultrasound guided injection.    EXAM: LEFT SHOULDER - 2+ VIEW  COMPARISON:  None.  FINDINGS: There is no evidence of fracture or dislocation. There is no evidence of arthropathy or other focal bone abnormality. Soft tissues are unremarkable.  IMPRESSION: No acute fracture or malalignment.   Electronically Signed   By: Ruthann Cancer MD   On: 01/25/2021 09:13  I, Lynne Leader, personally (independently) visualized and performed the interpretation of the images attached in this note.       Assessment and Plan: 60 y.o. female with left shoulder pain predominantly due to subacromial bursitis and rotator cuff tendinopathy.  Not improving with physical therapy as much as we would like.  Plan for subacromial injection today.  Patient will contact me via MyChart in about 2 weeks if not better and will proceed to MRI.  Otherwise recheck in 6 weeks.  Right shoulder improvement with PT.  Recheck 6 weeks.   PDMP not reviewed this encounter. Orders Placed This Encounter  Procedures  . [place] LIMITED JOINT SPACE STRUCTURES UP BILAT(NO LINKED CHARGES)    Standing Status:   Future    Number of Occurrences:   1    Standing Expiration Date:   10/11/2021    Order Specific Question:   Reason for Exam (SYMPTOM  OR DIAGNOSIS REQUIRED)    Answer:  bilateral shoulder pain    Order Specific Question:   Preferred imaging location?    Answer:   [facility]   No orders of the defined types were placed in this encounter.    Discussed warning signs or symptoms. Please see discharge instructions. Patient expresses understanding.   The above documentation has been reviewed and is accurate and complete Lynne Leader, M.D.  Visit conducted using an American  sign language interpreter.

## 2021-04-11 ENCOUNTER — Ambulatory Visit: Payer: Self-pay

## 2021-04-11 ENCOUNTER — Ambulatory Visit (INDEPENDENT_AMBULATORY_CARE_PROVIDER_SITE_OTHER): Payer: Federal, State, Local not specified - PPO | Admitting: Family Medicine

## 2021-04-11 ENCOUNTER — Ambulatory Visit: Payer: Federal, State, Local not specified - PPO | Attending: Plastic Surgery

## 2021-04-11 ENCOUNTER — Other Ambulatory Visit: Payer: Self-pay

## 2021-04-11 ENCOUNTER — Telehealth: Payer: Self-pay | Admitting: Podiatry

## 2021-04-11 VITALS — BP 131/85 | HR 77 | Ht 64.0 in | Wt 236.8 lb

## 2021-04-11 DIAGNOSIS — M25511 Pain in right shoulder: Secondary | ICD-10-CM

## 2021-04-11 DIAGNOSIS — M6281 Muscle weakness (generalized): Secondary | ICD-10-CM | POA: Insufficient documentation

## 2021-04-11 DIAGNOSIS — M25512 Pain in left shoulder: Secondary | ICD-10-CM

## 2021-04-11 DIAGNOSIS — M545 Low back pain, unspecified: Secondary | ICD-10-CM | POA: Diagnosis not present

## 2021-04-11 DIAGNOSIS — G8929 Other chronic pain: Secondary | ICD-10-CM

## 2021-04-11 DIAGNOSIS — M25612 Stiffness of left shoulder, not elsewhere classified: Secondary | ICD-10-CM | POA: Diagnosis not present

## 2021-04-11 DIAGNOSIS — M25611 Stiffness of right shoulder, not elsewhere classified: Secondary | ICD-10-CM | POA: Diagnosis not present

## 2021-04-11 DIAGNOSIS — R293 Abnormal posture: Secondary | ICD-10-CM | POA: Diagnosis not present

## 2021-04-11 NOTE — Telephone Encounter (Signed)
Orthotics in.. lvm for pt to call to schedule an appt to pick them up. °

## 2021-04-11 NOTE — Patient Instructions (Signed)
Access Code: [email or web address] URL: [email or web address] Date: 04/11/2021 Prepared by: Sherlynn Stalls  Exercises Supine Lower Trunk Rotation - 1 x daily - 7 x weekly - 3 sets - 10 reps Seated Gentle Upper Trapezius Stretch - 1 x daily - 7 x weekly - 4 sets - 30 seconds hold Gentle Levator Scapulae Stretch - 1 x daily - 7 x weekly - 4 sets - 30 seconds hold Seated Scapular Retraction - 1 x daily - 7 x weekly - 2 sets - 10 reps Standing Shoulder Abduction Finger Walk at Wall - 1 x daily - 7 x weekly - 3 sets - 10 reps Doorway Pec Stretch at 60 Elevation - 1 x daily - 7 x weekly - 4 sets - 30 sec hold Standing Shoulder External Rotation Stretch in Doorway - 1 x daily - 7 x weekly - 3 sets - 30 seconds hold

## 2021-04-11 NOTE — Patient Instructions (Signed)
Thank you for coming in today.  Call or go to the ER if you develop a large red swollen joint with extreme pain or oozing puss.   If this shot does not help let me know and I will order an MRI.   Give it 2 weeks.   Otherwise Recheck 6 weeks.

## 2021-04-11 NOTE — Therapy (Signed)
[facility]. [place], [place], [address] Phone: [phone]   Fax:  [phone]  Physical Therapy Treatment/ Recertification Progress Note Reporting Period 01/31/21 to 04/11/2021  See note below for Objective Data and Assessment of Progress/Goals.       Patient Details  Name: Rhonda Morrow MRN: [id number] Date of Birth: 08-07-1961 Referring Provider (PT): Marla Roe Loel Lofty, DO   Encounter Date: 04/11/2021   PT End of Session - 04/11/21 1219    Visit Number 4    Date for PT Re-Evaluation 05/20/21    Authorization Type BCBS    PT Start Time 1215    PT Stop Time 1300    PT Time Calculation (min) 45 min    Activity Tolerance Patient tolerated treatment well    Behavior During Therapy [facility] for tasks assessed/performed           Past Medical History:  Diagnosis Date  . Arthritis   . Asthma   . Complication of anesthesia    one time woke up and was vey scared,17 yrs ago  . Deaf   . Diabetes mellitus without complication ([place])   . GERD (gastroesophageal reflux disease)   . Hypertension   . Left groin pain   . Thyroid disease     Past Surgical History:  Procedure Laterality Date  . CARDIAC CATHETERIZATION    . CESAREAN SECTION    . CHOLECYSTECTOMY N/A 06/20/2016   Procedure: LAPAROSCOPIC CHOLECYSTECTOMY;  Surgeon: Rolm Bookbinder, MD;  Location: [place];  Service: General;  Laterality: N/A;  . ESOPHAGEAL MANOMETRY N/A 09/29/2013   Procedure: ESOPHAGEAL MANOMETRY (EM);  Surgeon: Milus Banister, MD;  Location: [facility];  Service: Endoscopy;  Laterality: N/A;  . TOTAL HIP ARTHROPLASTY Left 02/21/2017   Procedure: LEFT TOTAL HIP ARTHROPLASTY ANTERIOR APPROACH;  Surgeon: Leandrew Koyanagi, MD;  Location: [place];  Service: Orthopedics;  Laterality: Left;    There were no vitals filed for this visit.   Subjective Assessment - 04/11/21 1217    Subjective Shoulders have been feeling a little less pain. Trying to  rest when not working. Has been very busy, hard to get exercises in.  Is seeing Dr [name] this afternoon.    Patient is accompained by: Interpreter   Juliann Pulse   Pertinent History Deaf, left THA, asthma, DM, arthritis, HTN, gout right foot    Patient Stated Goals to get rid of pain , get back to PLOF    Currently in Pain? Yes    Pain Score 4     Pain Location Shoulder    Pain Orientation Right;Left              [facility] PT Assessment - 04/11/21 0001      Assessment   Medical Diagnosis N62 (ICD-10-CM) - Large breasts  M25.511,G89.29 (ICD-10-CM) - Chronic right shoulder pain  M54.42 (ICD-10-CM) - Acute left-sided low back pain with left-sided sciatica    Referring Provider (PT) Dillingham, Loel Lofty, DO    Hand Dominance Right      AROM   Right/Left Shoulder --   Functional ER: Left to T1, Right to C7. Functional IR: Left to T12, Right to T10.                        [facility] Adult PT Treatment/Exercise - 04/11/21 0001      Neck Exercises: Seated   Other Seated Exercise AAROM cane flexion x10, ABD B x 10  each , ext x 10 . OHP 1# wate bar. ER/IR with cane x 10 (very difficult to get end range ER on left)     Shoulder Exercises: Stretch   Other Shoulder Stretches table stretch for forward flexion  5" x 5 B. ER stretch passive 5" x 10 and yellow TB ER x10 (very difficulty on the left).          UBE L2 , 3 min each          PT Education - 04/11/21 1258    Education Details Updated initial HEP. Access Code: [email or web address]  URL: [email or web address]  Date: 04/11/2021  Prepared by: Sherlynn Stalls    Exercises  Supine Lower Trunk Rotation - 1 x daily - 7 x weekly - 3 sets - 10 reps  Seated Gentle Upper Trapezius Stretch - 1 x daily - 7 x weekly - 4 sets - 30 seconds hold  Gentle Levator Scapulae Stretch - 1 x daily - 7 x weekly - 4 sets - 30 seconds hold  Seated Scapular Retraction - 1 x daily - 7 x weekly - 2 sets - 10 reps  Standing Shoulder Abduction Finger Walk at  Wall - 1 x daily - 7 x weekly - 3 sets - 10 reps  Doorway Pec Stretch at 60 Elevation - 1 x daily - 7 x weekly - 4 sets - 30 sec hold  Standing Shoulder External Rotation Stretch in Doorway - 1 x daily - 7 x weekly - 3 sets - 30 seconds hold    Person(s) Educated Patient    Methods Explanation;Demonstration    Comprehension Verbalized understanding;Returned demonstration            PT Short Term Goals - 03/08/21 1613      PT SHORT TERM GOAL #1   Title Independent with initial HEP    Time 2    Period Weeks    Status On-going      PT SHORT TERM GOAL #2   Title Pt will demo good understanding for body mechanics with ADLs, lifting    Time 4    Period Weeks    Status On-going             PT Long Term Goals - 04/11/21 1221      PT LONG TERM GOAL #1   Title decreased pain in bilateral shoulders with ADLS by 67% or more to normalize ADLS.    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     PT LONG TERM GOAL #2   Title Improved B shoulder ROM to [facility] with </= 2/10 pain to facilitate ability to reach behind back/neck for grooming and dressing as well as household chores    Baseline Very limited and painful functional IR and ER.  04/11/21: some pain with functional ER but otherwise better, improved overall range for ER and IR.    Time 8    Period Weeks    Status On-going      PT LONG TERM GOAL #3   Title Pt will be able to lift various weighted objects with good body mechanics and </= 2/10 pain to facilitate painfree work/home activities.    Time 8    Period Weeks    Status On-going      PT LONG TERM GOAL #4   Title Pt able to  sleep without waking from pain    Time 8    Period Weeks    Status On-going   still with pain more on the left but the right has improved a little bit                Plan - 04/11/21 1219    Clinical Impression Statement Rhonda Morrow has  attended 4 total PT appointments at this time including PT initial evaluation. She reports some carryover of home exercises but not consistent d/t very busy schedule and being primary caregiver for mother and husband. She has made some improvements in ROM but is still very limited in internal and external rotation ROM (left UE worse than RUE). Overall shoulder pain has improved very slightly but is still limiting for certain ADLs, lifting, cleaning especially the left shoulder. Low back pain does limit tolerance to standing. Dicussed with pt given her busy schedule and home life she would like to spread out PT appointments. She would continue to benefit from skilled PT to work on decreasing shoulder/back pain, imprivng overall strength and functional mobility.    Comorbidities DM, OSA, Hyperlipidemia, Restless leg syndrome, gout, HTN, Asthma, left THA, Deaf, Arthitis    PT Frequency Other (comment)   Every 3 weeks   PT Duration Other (comment)   3-4 additional visits   PT Treatment/Interventions ADLs/Self Care Home Management;Cryotherapy;Electrical Stimulation;Iontophoresis 4mg /ml Dexamethasone;Moist Heat;Therapeutic exercise;Neuromuscular re-education;Patient/family education;Manual techniques;Dry needling;Taping;Functional mobility training;Therapeutic activities;Energy conservation    PT Next Visit Plan Progress B Shoulder Strength and ROM within pain limited ranges, anterior chest/shoulder and neck ms flexiblity, core/LE flexibility. Educate regarding postural strengthening and body mechanics. Manual and modalities as needed for pain and ms extensibility.    PT Home Exercise Plan Updated HEP provided today.    Consulted and Agree with Plan of Care Patient           Patient will benefit from skilled therapeutic intervention in order to improve the following deficits and impairments:  Pain,Decreased range of motion,Decreased strength,Impaired UE functional use,Decreased mobility,Postural  dysfunction,Improper body mechanics,Impaired flexibility  Visit Diagnosis: Acute pain of right shoulder  Stiffness of right shoulder, not elsewhere classified  Chronic left shoulder pain  Stiffness of left shoulder, not elsewhere classified  Muscle weakness (generalized)  Abnormal posture  Chronic low back pain, unspecified back pain laterality, unspecified whether sciatica present     Problem List Patient Active Problem List   Diagnosis Date Noted  . Acute pain of left shoulder 01/24/2021  . Acute pain of right shoulder 01/24/2021  . Foot pain, bilateral 01/24/2021  . Dyspepsia 01/24/2021  . Preventative health care 01/24/2021  . Hand pain, right 12/29/2020  . OSA (obstructive sleep apnea) 09/12/2020  . Nocturia more than twice per night 08/03/2020  . Non-restorative sleep 08/03/2020  . Leg cramping 08/03/2020  . Hyperlipidemia associated with type 2 diabetes mellitus ([place]) 07/25/2020  . Large breasts 07/22/2020  . RLS (restless legs syndrome) 05/17/2020  . Cough 04/01/2020  . Moderate persistent asthma without complication 24/23/5361  . Medication management 04/01/2020  . Menopausal symptoms 03/29/2020  . Hot flashes 03/29/2020  . Chronic right shoulder pain 08/11/2019  . Acute non-recurrent maxillary sinusitis 08/11/2019  . BMI 40.0-44.9, adult ([place]) 01/22/2019  . Right lower quadrant abdominal pain 10/20/2018  . Rectal pain 10/20/2018  . Vitamin B 12 deficiency 03/28/2018  . B12 deficiency 03/28/2018  . DM (diabetes mellitus) type II uncontrolled, periph vascular disorder ([place]) 03/28/2018  . Hyperlipidemia 03/28/2018  . Primary osteoarthritis of  left hip 02/21/2017  . 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 History of laparoscopic cholecystectomy 06/20/2016  . Cerumen impaction 06/09/2016  . Hypersomnia 05/16/2016  . Knee pain, right 05/05/2016  . RUQ pain 04/09/2016  . Abnormal CT scan 03/28/2016  . Dyspnea and respiratory abnormalities  03/15/2016  . Asthma with acute exacerbation 03/15/2016  . Asthma in adult 02/25/2016  . DM (diabetes mellitus) type II controlled, neurological manifestation ([place]) 02/09/2016  . Left hip pain 12/23/2015  . Right hamstring muscle strain 11/18/2015  . Abdominal pain, acute 09/01/2015  . Acute asthma exacerbation 04/16/2015  . Acute bronchitis 04/14/2015  . Pap smear for cervical cancer screening 09/14/2014  . Bed bug bite 05/06/2014  . Allergic rhinitis 04/09/2014  . Rectal itching 04/09/2014  . Bladder spasm 04/09/2014  . Knee pain, left 03/05/2014  . Hypokalemia 02/19/2014  . Nausea with vomiting 02/19/2014  . Diabetes mellitus, type II ([place]) 02/19/2014  . Edema 02/16/2014  . Disorder of rotator cuff 11/12/2013  . Routine general medical examination at a health care facility 08/20/2013  . Gastroesophageal reflux disease 06/26/2013  . Dysphagia, pharyngoesophageal phase 06/26/2013  . Medication side effect 03/25/2013  . Diarrhea 03/25/2013  . Benign positional vertigo 01/30/2013  . Traumatic hematoma of thigh 01/15/2013  . HTN (hypertension) 09/02/2012  . Dry skin 08/22/2012  . External hemorrhoids 08/22/2012  . Obesity 06/30/2012  . Thyromegaly 05/22/2012  . Back pain 05/22/2012  . Elevated glucose 05/22/2012  . Moderate persistent asthma 04/19/2012  . Dyspnea 01/28/2012    Hall Busing, PT, DPT 04/11/2021, 2:04 PM  [facility]. [place], [place], [address] Phone: [phone]   Fax:  [phone]  Name: Rhonda Morrow MRN: [id number] Date of Birth: 1960-11-18

## 2021-04-12 NOTE — Telephone Encounter (Signed)
Patient called back in looking for FMLA paperwork to see if they are complete. She is requesting for a note to be sent with paperwork to her her job know it was mishandling on our end and not hers for the delay of paperwork being sent after  04/09/21. Patient previous requeted  To be called once paperwork was complete.  I informed patient we would research whereabouts  And call her back with an update.

## 2021-04-12 NOTE — Telephone Encounter (Signed)
Form was faxed on 04/07/21. Was unaware to make copy and all since previous message did not make it to the back office. I refaxed the forms with our confirmation from 04/07/21 as proof of fax. Copy made for pt and scan.

## 2021-04-19 ENCOUNTER — Ambulatory Visit (INDEPENDENT_AMBULATORY_CARE_PROVIDER_SITE_OTHER): Payer: Federal, State, Local not specified - PPO | Admitting: *Deleted

## 2021-04-19 ENCOUNTER — Other Ambulatory Visit: Payer: Self-pay

## 2021-04-19 DIAGNOSIS — M7662 Achilles tendinitis, left leg: Secondary | ICD-10-CM

## 2021-04-19 NOTE — Progress Notes (Signed)
Patient presents today to pick up custom molded foot orthotics, diagnosed with achilles tendonitis by Dr. Paulla Dolly.   Orthotics were dispensed and fit was satisfactory. Reviewed instructions for break-in and wear. Written instructions given to patient.  Patient will follow up as needed.   [facility] - order # [id number]

## 2021-04-25 ENCOUNTER — Other Ambulatory Visit: Payer: Self-pay | Admitting: Family Medicine

## 2021-04-25 DIAGNOSIS — I10 Essential (primary) hypertension: Secondary | ICD-10-CM

## 2021-04-27 DIAGNOSIS — G4733 Obstructive sleep apnea (adult) (pediatric): Secondary | ICD-10-CM | POA: Diagnosis not present

## 2021-04-28 ENCOUNTER — Telehealth: Payer: Self-pay | Admitting: Neurology

## 2021-04-28 NOTE — Telephone Encounter (Signed)
Initial autopap scheduled for 07/05/21 at 1:30 pm.

## 2021-04-29 ENCOUNTER — Other Ambulatory Visit: Payer: Self-pay

## 2021-04-29 DIAGNOSIS — J4520 Mild intermittent asthma, uncomplicated: Secondary | ICD-10-CM

## 2021-04-29 MED ORDER — ALBUTEROL SULFATE HFA 108 (90 BASE) MCG/ACT IN AERS: 1.0000 | INHALATION_SPRAY | Freq: Four times a day (QID) | RESPIRATORY_TRACT | 6 refills | Status: DC | PRN

## 2021-04-29 NOTE — Progress Notes (Signed)
Albuterol inhaler sent into pharmacy

## 2021-05-02 ENCOUNTER — Ambulatory Visit: Payer: Federal, State, Local not specified - PPO

## 2021-05-05 ENCOUNTER — Other Ambulatory Visit: Payer: Self-pay | Admitting: Family Medicine

## 2021-05-05 DIAGNOSIS — IMO0002 Reserved for concepts with insufficient information to code with codable children: Secondary | ICD-10-CM

## 2021-05-05 DIAGNOSIS — E1151 Type 2 diabetes mellitus with diabetic peripheral angiopathy without gangrene: Secondary | ICD-10-CM

## 2021-05-11 ENCOUNTER — Ambulatory Visit: Payer: Federal, State, Local not specified - PPO

## 2021-05-11 ENCOUNTER — Telehealth: Payer: Self-pay | Admitting: Family Medicine

## 2021-05-11 NOTE — Telephone Encounter (Signed)
The patient states she needs a letter stating she is deaf to be excused from jury duty. Please call when ready.

## 2021-05-11 NOTE — Telephone Encounter (Signed)
Rhonda Morrow called and she will bring letter by today

## 2021-05-11 NOTE — Telephone Encounter (Signed)
Pt called. Left message that we need a copy of the jury summons

## 2021-05-23 ENCOUNTER — Ambulatory Visit: Payer: Federal, State, Local not specified - PPO

## 2021-05-24 ENCOUNTER — Ambulatory Visit: Payer: Federal, State, Local not specified - PPO | Admitting: Family Medicine

## 2021-05-24 NOTE — Progress Notes (Deleted)
   I, Peterson Lombard, LAT, ATC acting as a scribe for Lynne Leader, MD.  Rhonda Morrow is a 60 y.o. female who presents to [place] [facility] today for f/u bilat shoulder pain, L>R, thought to be due to subacromial bursitis and rotator cuff tendinopathy. 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 Pt was last seen by Dr. [name] on 04/11/21 and was given a L subacromial steroid injection and advise to send a MyChart message in 2 weeks if not better. No message was received. Today, pt reports  Dx imaging: 01/24/21 R & L shoulder XR  Pertinent review of systems: ***  Relevant historical information: ***   Exam:  LMP 05/20/2012  General: Well Developed, well nourished, and in no acute distress.   MSK: ***    Lab and Radiology Results No results found. However, due to the size of the patient record, not all encounters were searched. Please check Results Review for a complete set of results. No results found.     Assessment and Plan: 60 y.o. female with ***   PDMP not reviewed this encounter. No orders of the defined types were placed in this encounter.  No orders of the defined types were placed in this encounter.    Discussed warning signs or symptoms. Please see discharge instructions. Patient expresses understanding.   ***

## 2021-05-27 DIAGNOSIS — G4733 Obstructive sleep apnea (adult) (pediatric): Secondary | ICD-10-CM | POA: Diagnosis not present

## 2021-05-30 DIAGNOSIS — Z20822 Contact with and (suspected) exposure to covid-19: Secondary | ICD-10-CM | POA: Diagnosis not present

## 2021-05-30 DIAGNOSIS — Z03818 Encounter for observation for suspected exposure to other biological agents ruled out: Secondary | ICD-10-CM | POA: Diagnosis not present

## 2021-06-02 ENCOUNTER — Other Ambulatory Visit: Payer: Self-pay

## 2021-06-02 ENCOUNTER — Ambulatory Visit (INDEPENDENT_AMBULATORY_CARE_PROVIDER_SITE_OTHER): Payer: Federal, State, Local not specified - PPO | Admitting: Family Medicine

## 2021-06-02 VITALS — BP 126/82 | HR 82 | Temp 98.6°F | Resp 18 | Ht 64.0 in | Wt 236.8 lb

## 2021-06-02 DIAGNOSIS — M25531 Pain in right wrist: Secondary | ICD-10-CM | POA: Diagnosis not present

## 2021-06-02 DIAGNOSIS — M79641 Pain in right hand: Secondary | ICD-10-CM | POA: Diagnosis not present

## 2021-06-02 MED ORDER — MELOXICAM 7.5 MG PO TABS: ORAL_TABLET | ORAL | 1 refills | Status: DC

## 2021-06-02 NOTE — Patient Instructions (Signed)
Hand Pain Many things can cause hand pain. Some common causes are: An injury. Repeating the same movement with your hand over and over (overuse). Osteoporosis. Arthritis. Lumps in the tendons or joints of the hand and wrist (ganglion cysts). Nerve compression syndromes (carpal tunnel syndrome). Inflammation of the tendons (tendinitis). Infection. Follow these instructions at home: Pay attention to any changes in your symptoms. Take these actions to help withyour discomfort: Managing pain, stiffness, and swelling  Take over-the-counter and prescription medicines only as told by your health care provider. Wear a hand splint or support as told by your health care provider. If directed, put ice on the affected area: Put ice in a plastic bag. Place a towel between your skin and the bag. Leave the ice on for 20 minutes, 2-3 times a day.  Activity Take breaks from repetitive activity often. Avoid activities that make your pain worse. Minimize stress on your hands and wrists as much as possible. Do stretches or exercises as told by your health care provider. Do not do activities that make your pain worse. Contact a health care provider if: Your pain does not get better after a few days of self-care. Your pain gets worse. Your pain affects your ability to do your daily activities. Get help right away if: Your hand becomes warm, red, or swollen. Your hand is numb or tingling. Your hand is extremely swollen or deformed. Your hand or fingers turn white or blue. You cannot move your hand, wrist, or fingers. Summary Many things can cause hand pain. Contact your health care provider if your pain does not get better after a few days of self care. Minimize stress on your hands and wrists as much as possible. Do not do activities that make your pain worse. This information is not intended to replace advice given to you by your health care provider. Make sure you discuss any questions you have  with your healthcare provider. Document Revised: 07/19/2018 Document Reviewed: 07/19/2018 Elsevier Patient Education  [place].

## 2021-06-02 NOTE — Progress Notes (Addendum)
Subjective:   By signing my name below, I, Shehryar Baig, attest that this documentation has been prepared under the direction and in the presence of Dr. [name], DO. 06/02/2021    Patient ID: Rhonda Morrow, female    DOB: 09-Apr-1961, 60 y.o.   MRN: [id number]  Chief Complaint  Patient presents with   Hand Pain    Right hand pain with trembling and stiffness, Pain started Saturday. 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     HPI Patient is in today for a office visit. 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 She requested off the next day because of the pain and found some relief while resting. She reports that her wrist and palm of her hand has a sore pain at this time as well because she just came back from work. 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 She is requesting for a different position at her work that involves less pressure on her hands.  She also complains of difficulty sleeping due to the pain. She is inquiring if talking pain medication with sedative effects are ok for her.  She reports communicating has been difficult because she uses her hands as primary  communication as she is deaf.  She also complains having stress from working 2 jobs that is effecting how often she gets hurt at work.   Past Medical History:  Diagnosis Date   Arthritis    Asthma    Complication of anesthesia    one time woke up and was vey scared,17 yrs ago   Deaf    Diabetes mellitus without complication ([place])    GERD (gastroesophageal reflux disease)    Hypertension    Left groin pain    Thyroid disease     Past Surgical History:  Procedure Laterality Date   CARDIAC CATHETERIZATION     CESAREAN SECTION     CHOLECYSTECTOMY N/A 06/20/2016   Procedure: LAPAROSCOPIC CHOLECYSTECTOMY;  Surgeon: Rolm Bookbinder, MD;  Location: [facility];   Service: General;  Laterality: N/A;   ESOPHAGEAL MANOMETRY N/A 09/29/2013   Procedure: ESOPHAGEAL MANOMETRY (EM);  Surgeon: Milus Banister, MD;  Location: [facility];  Service: Endoscopy;  Laterality: N/A;   TOTAL HIP ARTHROPLASTY Left 02/21/2017   Procedure: LEFT TOTAL HIP ARTHROPLASTY ANTERIOR APPROACH;  Surgeon: Leandrew Koyanagi, MD;  Location: [facility];  Service: Orthopedics;  Laterality: Left;    Family History  Problem Relation Age of Onset   Diabetes Mother    Heart disease Mother    Diabetes Father     Social History   Socioeconomic History   Marital status: Married    Spouse name: Not on file   Number of children: Not on file   Years of education: Not on file   Highest education level: Not on file  Occupational History   Occupation: disabled  Tobacco Use   Smoking status: Never   Smokeless tobacco: Never  Vaping Use   Vaping Use: Never used  Substance and Sexual Activity   Alcohol use: No    Alcohol/week: 0.0 standard drinks   Drug use: No   Sexual activity: Never  Other Topics Concern   Not on file  Social History Narrative   Lives with family.  Has 3 children.  Works for [employer].  Education: high school.   Social Determinants of Health  Financial Resource Strain: Not on file  Food Insecurity: Not on file  Transportation Needs: Not on file  Physical Activity: Not on file  Stress: Not on file  Social Connections: Not on file  Intimate Partner Violence: Not on file    Outpatient Medications Prior to Visit  Medication Sig Dispense Refill   albuterol (VENTOLIN HFA) 108 (90 Base) MCG/ACT inhaler Inhale 1-2 puffs into the lungs every 6 (six) hours as needed for wheezing or shortness of breath. 8 g 6   allopurinol (ZYLOPRIM) 100 MG tablet Take 1 tablet (100 mg total) by mouth daily. 90 tablet 3   amLODipine (NORVASC) 5 MG tablet Take 1 tablet (5 mg total) by mouth daily. 90 tablet 1   aspirin EC 325 MG tablet Take 1 tablet (325 mg total) by mouth 2 (two) times  daily. 84 tablet 0   azelastine (ASTELIN) 0.1 % nasal spray Place 2 sprays into both nostrils 2 (two) times daily. Use in each nostril as directed 30 mL 3   blood glucose meter kit and supplies KIT Dispense based on patient and insurance preference. Use up to four times daily as directed. (FOR ICD-9 250.00, 250.01). 1 each 0   blood glucose meter kit and supplies KIT Dispense based on patient and insurance preference. Use up to four times daily as directed. 1 each 0   Calcium Carbonate-Vit D-Min (CALCIUM 1200) 1200-1000 MG-UNIT CHEW 1 po qd 30 tablet 11   cetirizine (ZYRTEC) 10 MG tablet TAKE 1 TABLET BY MOUTH EVERY DAY 90 tablet 1   colchicine 0.6 MG tablet Take 1 tablet (0.6 mg total) by mouth daily. 90 tablet 1   COVID-19 mRNA vaccine, Pfizer, 30 MCG/0.3ML injection INJECT AS DIRECTED .3 mL 0   Cyanocobalamin (B-12) 1000 MCG SUBL Dissolove 1 tablet under tongue once a day 90 tablet 1   diclofenac (VOLTAREN) 75 MG EC tablet Take 1 tablet (75 mg total) by mouth 2 (two) times daily as needed. 60 tablet 3   diclofenac (VOLTAREN) 75 MG EC tablet Take 1 tablet (75 mg total) by mouth 2 (two) times daily. 50 tablet 2   diclofenac sodium (VOLTAREN) 1 % GEL Apply 4 g topically 4 (four) times daily as needed. 500 g 6   escitalopram (LEXAPRO) 10 MG tablet TAKE 1 TABLET BY MOUTH EVERYDAY AT BEDTIME 90 tablet 1   famotidine (PEPCID) 20 MG tablet TAKE 1 TABLET BY MOUTH EVERY DAY *NOT COVERED 30 tablet 0   Fiber CHEW Chew 1 tablet by mouth daily.     fluticasone (FLONASE) 50 MCG/ACT nasal spray Place 2 sprays into both nostrils daily. 16 g 6   fluticasone (FLONASE) 50 MCG/ACT nasal spray SPRAY 2 SPRAYS INTO EACH NOSTRIL EVERY DAY 48 mL 1   hydrochlorothiazide (HYDRODIURIL) 12.5 MG tablet Take 1 tablet (12.5 mg total) by mouth daily. 90 tablet 1   hydrOXYzine (ATARAX/VISTARIL) 10 MG tablet 1-2 tab po qhs prn 180 tablet 1   ipratropium-albuterol (DUONEB) 0.5-2.5 (3) MG/3ML SOLN Take 3 mLs by nebulization every 6  (six) hours as needed. 360 mL 3   Lancets (ONETOUCH ULTRASOFT) lancets Use as instructed 100 each 12   meclizine (ANTIVERT) 12.5 MG tablet Take 1 tablet (12.5 mg total) by mouth 3 (three) times daily as needed for dizziness. 30 tablet 0   metaxalone (SKELAXIN) 800 MG tablet Take 1 tablet (800 mg total) by mouth 3 (three) times daily. 30 tablet 2   montelukast (SINGULAIR) 10 MG tablet Take 1 tablet (10 mg  total) by mouth at bedtime. 90 tablet 3   Multiple Vitamin (MULTIVITAMIN) tablet Take 1 tablet by mouth daily.     NONFORMULARY OR COMPOUNDED ITEM Nebulizer   DX ASTHMA 1 each 0   nystatin-triamcinolone (MYCOLOG II) cream Apply twice daily to lower chest area/upper abdomen 30 g 0   ofloxacin (FLOXIN OTIC) 0.3 % OTIC solution Place 10 drops into the right ear daily. 10 mL 0   omeprazole (PRILOSEC) 20 MG capsule Take 1 capsule (20 mg total) by mouth daily. 90 capsule 3   omeprazole (PRILOSEC) 20 MG capsule Take 1 capsule (20 mg total) by mouth daily. 30 capsule 3   ondansetron (ZOFRAN ODT) 4 MG disintegrating tablet Take 1 tablet (4 mg total) by mouth every 8 (eight) hours as needed for nausea or vomiting. 20 tablet 0   ONETOUCH VERIO test strip USE AS INSTRUCTED--- ONE TOUCH VERIO TEST STRIPS 100 strip 12   potassium chloride SA (KLOR-CON M20) 20 MEQ tablet Take 1 tablet (20 mEq total) by mouth daily. 90 tablet 1   promethazine-dextromethorphan (PROMETHAZINE-DM) 6.25-15 MG/5ML syrup Take 5 mLs by mouth 4 (four) times daily as needed. 118 mL 0   rOPINIRole (REQUIP) 0.25 MG tablet TAKE 1 TABLET BY MOUTH AT BEDTIME FOR 4 DAYS THEN INCREASE TO 2 TABLETS AT BEDTIME IF NEEDED 180 tablet 1   rosuvastatin (CRESTOR) 10 MG tablet TAKE 1 TABLET BY MOUTH EVERY DAY 90 tablet 1   Spacer/Aero-Holding Chambers (AEROCHAMBER MV) inhaler Use as instructed 1 each 0   SYMBICORT 160-4.5 MCG/ACT inhaler Inhale 2 puffs into the lungs every 12 (twelve) hours. 1 each 8   traMADol (ULTRAM) 50 MG tablet TAKE 1 TABLET (50 MG  TOTAL) BY MOUTH EVERY 8 (EIGHT) HOURS AS NEEDED FOR UP TO 5 DAYS. 16 tablet 0   triamcinolone cream (KENALOG) 0.1 % Apply bid to hands 30 g 0   metFORMIN (GLUCOPHAGE-XR) 500 MG 24 hr tablet TAKE 2 TABLET BY MOUTH EVERY DAY WITH BREAKFAST 60 tablet 1   Facility-Administered Medications Prior to Visit  Medication Dose Route Frequency Provider Last Rate Last Admin   alum & mag hydroxide-simeth (MAALOX/MYLANTA) 200-200-20 MG/5ML suspension 30 mL  30 mL Oral Once Saguier, Edward, PA-C       hyoscyamine (LEVSIN SL) SL tablet 0.125 mg  0.125 mg Sublingual Once Saguier, Edward, PA-C       lidocaine (XYLOCAINE) 2 % viscous mouth solution 15 mL  15 mL Mouth/Throat Once Saguier, Edward, PA-C       nitroGLYCERIN (NITRODUR - Dosed in mg/24 hr) patch 0.2 mg  0.2 mg Transdermal Daily Regal, Tamala Fothergill, DPM        Allergies  Allergen Reactions   Losartan Shortness Of Breath   Oxycodone-Acetaminophen Nausea And Vomiting   Augmentin [Amoxicillin-Pot Clavulanate] Diarrhea    Review of Systems  Constitutional:  Negative for chills, fever and malaise/fatigue.  HENT:  Negative for congestion and hearing loss.   Eyes:  Negative for discharge.  Respiratory:  Negative for cough, sputum production and shortness of breath.   Cardiovascular:  Negative for chest pain, palpitations and leg swelling.  Gastrointestinal:  Negative for abdominal pain, blood in stool, constipation, diarrhea, heartburn, nausea and vomiting.  Genitourinary:  Negative for dysuria, frequency, hematuria and urgency.  Musculoskeletal:  Positive for joint pain (right wrist) and myalgias (right palm). Negative for back pain and falls.  Skin:  Negative for rash.  Neurological:  Negative for dizziness, sensory change, loss of consciousness, weakness and headaches.  Endo/Heme/Allergies:  Negative for environmental allergies. Does not bruise/bleed easily.  Psychiatric/Behavioral:  Negative for depression and suicidal ideas. The patient has insomnia.  The patient is not nervous/anxious.       Objective:    Physical Exam Vitals and nursing note reviewed.  Constitutional:      General: She is not in acute distress.    Appearance: Normal appearance. She is not ill-appearing.  HENT:     Head: Normocephalic and atraumatic.     Right Ear: External ear normal.     Left Ear: External ear normal.  Eyes:     Extraocular Movements: Extraocular movements intact.     Pupils: Pupils are equal, round, and reactive to light.  Cardiovascular:     Rate and Rhythm: Normal rate and regular rhythm.     Heart sounds: Normal heart sounds. No murmur heard.   No gallop.  Pulmonary:     Effort: Pulmonary effort is normal. No respiratory distress.     Breath sounds: Normal breath sounds. No wheezing or rales.  Musculoskeletal:     Comments: Right wrist mildly swollen and tender with movement --- not with palpation   Skin:    General: Skin is warm and dry.  Neurological:     Mental Status: She is alert and oriented to person, place, and time.  Psychiatric:        Behavior: Behavior normal.        Judgment: Judgment normal.    BP 126/82 (BP Location: Right Arm, Patient Position: Sitting, Cuff Size: Large)   Pulse 82   Temp 98.6 F (37 C) (Oral)   Resp 18   Ht 5' 4" (1.626 m)   Wt 236 lb 12.8 oz (107.4 kg)   LMP 05/20/2012   SpO2 97%   BMI 40.65 kg/m  Wt Readings from Last 3 Encounters:  06/02/21 236 lb 12.8 oz (107.4 kg)  04/11/21 236 lb 12.8 oz (107.4 kg)  03/22/21 237 lb (107.5 kg)    Diabetic Foot Exam - Simple   No data filed    Lab Results  Component Value Date   WBC 4.8 02/22/2021   HGB 13.9 02/22/2021   HCT 42.5 02/22/2021   PLT 138.0 (L) 02/22/2021   GLUCOSE 168 (H) 04/05/2021   CHOL 145 04/05/2021   TRIG 96.0 04/05/2021   HDL 54.60 04/05/2021   LDLCALC 71 04/05/2021   ALT 18 04/05/2021   AST 15 04/05/2021   NA 141 04/05/2021   K 3.4 (L) 04/05/2021   CL 102 04/05/2021   CREATININE 0.81 04/05/2021   BUN 13  04/05/2021   CO2 30 04/05/2021   TSH 0.74 04/20/2020   INR 1.05 02/12/2017   HGBA1C 7.2 (H) 04/05/2021   MICROALBUR 2.6 (H) 03/22/2021    Lab Results  Component Value Date   TSH 0.74 04/20/2020   Lab Results  Component Value Date   WBC 4.8 02/22/2021   HGB 13.9 02/22/2021   HCT 42.5 02/22/2021   MCV 84.1 02/22/2021   PLT 138.0 (L) 02/22/2021   Lab Results  Component Value Date   NA 141 04/05/2021   K 3.4 (L) 04/05/2021   CO2 30 04/05/2021   GLUCOSE 168 (H) 04/05/2021   BUN 13 04/05/2021   CREATININE 0.81 04/05/2021   BILITOT 0.3 04/05/2021   ALKPHOS 80 04/05/2021   AST 15 04/05/2021   ALT 18 04/05/2021   PROT 6.3 04/05/2021   ALBUMIN 3.7 04/05/2021   CALCIUM 9.5 04/05/2021   ANIONGAP 13 05/24/2020  GFR 80.50 04/05/2021   Lab Results  Component Value Date   CHOL 145 04/05/2021   Lab Results  Component Value Date   HDL 54.60 04/05/2021   Lab Results  Component Value Date   LDLCALC 71 04/05/2021   Lab Results  Component Value Date   TRIG 96.0 04/05/2021   Lab Results  Component Value Date   CHOLHDL 3 04/05/2021   Lab Results  Component Value Date   HGBA1C 7.2 (H) 04/05/2021       Assessment & Plan:   Problem List Items Addressed This Visit       Unprioritized   Right hand pain - Primary   Relevant Medications   meloxicam (MOBIC) 7.5 MG tablet   Right wrist pain    Wrist splint mobic prn  Ice Pt has sport med app --- she will discuss this with them if pain persists          Meds ordered this encounter  Medications   meloxicam (MOBIC) 7.5 MG tablet    Sig: 1-2 po qd prn    Dispense:  60 tablet    Refill:  1    I, Dr. [name], DO, personally preformed the services described in this documentation.  All medical record entries made by the scribe were at my direction and in my presence.  I have reviewed the chart and discharge instructions (if applicable) and agree that the record reflects my personal performance and is  accurate and complete. 06/02/2021   I,Shehryar Baig,acting as a scribe for [employer], DO.,have documented all relevant documentation on the behalf of Ann Held, DO,as directed by  Ann Held, DO while in the presence of Ann Held, DO.   Ann Held, DO

## 2021-06-03 ENCOUNTER — Other Ambulatory Visit: Payer: Self-pay | Admitting: Family Medicine

## 2021-06-03 DIAGNOSIS — E1151 Type 2 diabetes mellitus with diabetic peripheral angiopathy without gangrene: Secondary | ICD-10-CM

## 2021-06-03 DIAGNOSIS — IMO0002 Reserved for concepts with insufficient information to code with codable children: Secondary | ICD-10-CM

## 2021-06-03 DIAGNOSIS — E1165 Type 2 diabetes mellitus with hyperglycemia: Secondary | ICD-10-CM

## 2021-06-05 ENCOUNTER — Encounter: Payer: Self-pay | Admitting: Family Medicine

## 2021-06-05 DIAGNOSIS — M25531 Pain in right wrist: Secondary | ICD-10-CM | POA: Insufficient documentation

## 2021-06-05 NOTE — Assessment & Plan Note (Signed)
Wrist splint mobic prn  Ice Pt has sport med app --- she will discuss this with them if pain persists

## 2021-06-06 ENCOUNTER — Ambulatory Visit: Payer: Federal, State, Local not specified - PPO

## 2021-06-06 ENCOUNTER — Telehealth: Payer: Self-pay

## 2021-06-06 NOTE — Telephone Encounter (Signed)
Pt brought her Rhonda Morrow Summons in and her court date is 06/20/21. Okay for letter?

## 2021-06-06 NOTE — Telephone Encounter (Signed)
Pt called wanted to extend her excuse for Saturday and Sunday because she was unable to pick up her light duty letter Friday.

## 2021-06-09 ENCOUNTER — Ambulatory Visit: Payer: Federal, State, Local not specified - PPO | Admitting: Family Medicine

## 2021-06-10 ENCOUNTER — Other Ambulatory Visit: Payer: Self-pay | Admitting: Family Medicine

## 2021-06-10 DIAGNOSIS — Z889 Allergy status to unspecified drugs, medicaments and biological substances status: Secondary | ICD-10-CM

## 2021-06-13 ENCOUNTER — Ambulatory Visit: Payer: Federal, State, Local not specified - PPO

## 2021-06-13 ENCOUNTER — Ambulatory Visit: Payer: Federal, State, Local not specified - PPO | Admitting: Family Medicine

## 2021-06-13 ENCOUNTER — Telehealth: Payer: Self-pay | Admitting: *Deleted

## 2021-06-13 NOTE — Progress Notes (Deleted)
   I, Wendy Poet, LAT, ATC, am serving as scribe for Dr. Lynne Leader.  CRISEL OLTMAN is a 60 y.o. female who presents to [place] at [facility] today for f/u of B shoulder pain, L>R, due to [facility] tendinopathy and subacromial bursitis.  She was last seen by Dr. [name] on 04/11/21 and noted limited improvement.  She had a L subacromial steroid injection.  She has completed 4 PT sessions w/ no additional visits completed since her last visit w/ Dr. [name].  Today, pt reports  Diagnostic imaging: R and L shoulder XR- 01/24/21   Pertinent review of systems: ***  Relevant historical information: ***   Exam:  LMP 05/20/2012  General: Well Developed, well nourished, and in no acute distress.   MSK: ***    Lab and Radiology Results No results found. However, due to the size of the patient record, not all encounters were searched. Please check Results Review for a complete set of results. No results found.     Assessment and Plan: 60 y.o. female with ***   PDMP not reviewed this encounter. No orders of the defined types were placed in this encounter.  No orders of the defined types were placed in this encounter.    Discussed warning signs or symptoms. Please see discharge instructions. Patient expresses understanding.   ***

## 2021-06-13 NOTE — Telephone Encounter (Signed)
Left message on machine that jury summons letter is ready for pickup.

## 2021-06-17 ENCOUNTER — Other Ambulatory Visit: Payer: Self-pay | Admitting: Family Medicine

## 2021-06-17 DIAGNOSIS — M10079 Idiopathic gout, unspecified ankle and foot: Secondary | ICD-10-CM

## 2021-06-17 DIAGNOSIS — E1151 Type 2 diabetes mellitus with diabetic peripheral angiopathy without gangrene: Secondary | ICD-10-CM

## 2021-06-17 DIAGNOSIS — IMO0002 Reserved for concepts with insufficient information to code with codable children: Secondary | ICD-10-CM

## 2021-06-21 ENCOUNTER — Ambulatory Visit: Payer: Self-pay

## 2021-06-21 ENCOUNTER — Ambulatory Visit (INDEPENDENT_AMBULATORY_CARE_PROVIDER_SITE_OTHER): Payer: Federal, State, Local not specified - PPO

## 2021-06-21 ENCOUNTER — Ambulatory Visit (INDEPENDENT_AMBULATORY_CARE_PROVIDER_SITE_OTHER): Payer: Federal, State, Local not specified - PPO | Admitting: Family Medicine

## 2021-06-21 ENCOUNTER — Encounter: Payer: Self-pay | Admitting: Family Medicine

## 2021-06-21 ENCOUNTER — Other Ambulatory Visit: Payer: Self-pay

## 2021-06-21 VITALS — BP 120/78 | HR 88 | Ht 64.0 in | Wt 236.0 lb

## 2021-06-21 DIAGNOSIS — M7662 Achilles tendinitis, left leg: Secondary | ICD-10-CM

## 2021-06-21 DIAGNOSIS — M25512 Pain in left shoulder: Secondary | ICD-10-CM | POA: Diagnosis not present

## 2021-06-21 DIAGNOSIS — M79641 Pain in right hand: Secondary | ICD-10-CM

## 2021-06-21 DIAGNOSIS — M19041 Primary osteoarthritis, right hand: Secondary | ICD-10-CM | POA: Diagnosis not present

## 2021-06-21 DIAGNOSIS — G8929 Other chronic pain: Secondary | ICD-10-CM

## 2021-06-21 NOTE — Patient Instructions (Addendum)
Thank you for coming in today.   Do the hand exercises we discussed. Modeling clay or hand PT ball  Please use Voltaren gel (Generic Diclofenac Gel) up to 4x daily for pain as needed.  This is available over-the-counter as both the name brand Voltaren gel and the generic diclofenac gel.   Do the foot and ankle exercises.   Please perform the exercise program that we have prepared for you and gone over in detail on a daily basis.  In addition to the handout you were provided you can access your program through: [email or web address]   Your unique program code is:  [email or web address]  Continue meloxicam as needed.   Recheck in 6 weeks. 30 min visit schedule ASL interpreter.   Please get an Xray today before you leave

## 2021-06-21 NOTE — Progress Notes (Signed)
I, Rhonda Morrow, LAT, ATC, am serving as scribe for Dr. Lynne Leader.  Rhonda Morrow is a 60 y.o. female who presents to [place] at [facility] today for f/u bilat shoulder pain. 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 Pt was last seen by Dr. [name] on 04/11/21 (w/ 3 early cancellation/no-show appointments) and was given a L subacromial steroid injection. Today, pt reports that her L shoulder felt better after the injection but she is still having L shoulder pain. 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  She was provided a brace and was prescribed oral diclofenac.  Additionally she notes pain at the left posterior calcaneus.  She has been told that this is Achilles tendinitis by podiatry before.  She has had a trial of injection and orthotics.  The injection helped temporarily and the orthotics did not help much at all.  She is not had trial of exercises or physical therapy yet for this.  Dx imaging: 01/24/21 R & L shoulder XR  Pertinent review of systems: No fever or chills  Relevant historical information: Diabetes.  Deaf.   Exam:  BP 120/78 (BP Location: Left Arm, Patient Position: Sitting, Cuff Size: Large)   Pulse 88   Ht '5\' 4"'$  (1.626 m)   Wt 236 lb (107 kg)   LMP 05/20/2012   SpO2 98%   BMI 40.51 kg/m  General: Well Developed, well nourished, and in no acute distress.   MSK: Left shoulder normal motion. Right hand normal-appearing minimally tender palpation at palmar second and third MCPs.  Normal hand motion.  Normal grip strength.  No pain with resisted finger flexion.  Left ankle normal.  Normal motion.   Lab and Radiology Results  X-ray images right hand obtained today personally and independently interpreted Minimal degenerative changes.  No acute  fractures. Await formal radiology review    Assessment and Plan: 60 y.o. female with right hand pain.  Patient describes excessive pressure on the palmar aspect of her hand at work with the knob of the steering wheel. Pain due to contusion and pressure. Plan to continue meloxicam and Voltaren gel. Discussed home exercise plan. Continue hand padding.  Recheck in 6 weeks and consider injection if not better.   Left shoulder pain doing well with conservative management and repeat injection if needed at 6-week follow-up.  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  ATC taught eccentric home exercises in clinic today.  Recheck 6 weeks.   PDMP not reviewed this encounter. Orders Placed This Encounter  Procedures   DG Hand Complete Right    Standing Status:   Future    Number of Occurrences:   1    Standing Expiration Date:   06/21/2022    Order Specific Question:   Reason for Exam (SYMPTOM  OR DIAGNOSIS REQUIRED)    Answer:   eva; hand pain    Order Specific Question:   Is patient pregnant?    Answer:   No    Order Specific Question:   Preferred imaging location?    Answer:   [facility]   No orders of the defined types were placed in this encounter.    Discussed warning signs or symptoms. Please see discharge instructions. Patient expresses understanding.  The above documentation has been reviewed and is accurate and complete Lynne Leader, M.D.  Visit conducted using an American sign language interpreter.   Total encounter time 30 minutes including face-to-face time with the patient and, reviewing past medical record, and charting on the date of service.   Plan and options

## 2021-06-23 NOTE — Progress Notes (Signed)
Right hand x-ray does not show any problems where your pain is located in the palm of your hand.  There is some arthritis and a possible tiny old chip of a broken bone at the base of your thumb.  This is not where you are having pain so I do not think it is clinically significant.  We will look at this picture and talk about it more when you come back for the next visit.

## 2021-06-27 DIAGNOSIS — G4733 Obstructive sleep apnea (adult) (pediatric): Secondary | ICD-10-CM | POA: Diagnosis not present

## 2021-06-28 ENCOUNTER — Other Ambulatory Visit: Payer: Self-pay

## 2021-06-28 ENCOUNTER — Ambulatory Visit (INDEPENDENT_AMBULATORY_CARE_PROVIDER_SITE_OTHER): Payer: Federal, State, Local not specified - PPO | Admitting: Family Medicine

## 2021-06-28 ENCOUNTER — Encounter: Payer: Self-pay | Admitting: Family Medicine

## 2021-06-28 VITALS — BP 114/86 | HR 90 | Temp 99.6°F | Resp 18 | Ht 64.0 in | Wt 232.4 lb

## 2021-06-28 DIAGNOSIS — E1165 Type 2 diabetes mellitus with hyperglycemia: Secondary | ICD-10-CM

## 2021-06-28 DIAGNOSIS — J40 Bronchitis, not specified as acute or chronic: Secondary | ICD-10-CM

## 2021-06-28 DIAGNOSIS — R1013 Epigastric pain: Secondary | ICD-10-CM

## 2021-06-28 DIAGNOSIS — E785 Hyperlipidemia, unspecified: Secondary | ICD-10-CM

## 2021-06-28 DIAGNOSIS — E1169 Type 2 diabetes mellitus with other specified complication: Secondary | ICD-10-CM | POA: Diagnosis not present

## 2021-06-28 DIAGNOSIS — J4520 Mild intermittent asthma, uncomplicated: Secondary | ICD-10-CM

## 2021-06-28 DIAGNOSIS — I10 Essential (primary) hypertension: Secondary | ICD-10-CM

## 2021-06-28 DIAGNOSIS — J0100 Acute maxillary sinusitis, unspecified: Secondary | ICD-10-CM | POA: Diagnosis not present

## 2021-06-28 DIAGNOSIS — E1140 Type 2 diabetes mellitus with diabetic neuropathy, unspecified: Secondary | ICD-10-CM

## 2021-06-28 MED ORDER — PROMETHAZINE-DM 6.25-15 MG/5ML PO SYRP: 5.0000 mL | ORAL_SOLUTION | Freq: Four times a day (QID) | ORAL | 0 refills | Status: DC | PRN

## 2021-06-28 MED ORDER — SYMBICORT 160-4.5 MCG/ACT IN AERO: 2.0000 | INHALATION_SPRAY | Freq: Two times a day (BID) | RESPIRATORY_TRACT | 8 refills | Status: DC

## 2021-06-28 MED ORDER — OMEPRAZOLE 20 MG PO CPDR: 20.0000 mg | DELAYED_RELEASE_CAPSULE | Freq: Two times a day (BID) | ORAL | 3 refills | Status: DC

## 2021-06-28 MED ORDER — BLOOD GLUCOSE MONITOR KIT: PACK | 0 refills | Status: DC

## 2021-06-28 MED ORDER — FLUTICASONE PROPIONATE 50 MCG/ACT NA SUSP: 2.0000 | Freq: Every day | NASAL | 6 refills | Status: DC

## 2021-06-28 NOTE — Assessment & Plan Note (Signed)
Encourage heart healthy diet such as MIND or DASH diet, increase exercise, avoid trans fats, simple carbohydrates and processed foods, consider a krill or fish or flaxseed oil cap daily.  °

## 2021-06-28 NOTE — Patient Instructions (Signed)
https://www.diabeteseducator.org/docs/default-source/living-with-diabetes/conquering-the-grocery-store-v1.pdf?sfvrsn=4">  Carbohydrate Counting for Diabetes Mellitus, Adult Carbohydrate counting is a method of keeping track of how many carbohydrates you eat. Eating carbohydrates naturally increases the amount of sugar (glucose) in the blood. Counting how many carbohydrates you eat improves your bloodglucose control, which helps you manage your diabetes. It is important to know how many carbohydrates you can safely have in each meal. This is different for every person. A dietitian can help you make a meal plan and calculate how many carbohydrates you should have at each meal andsnack. What foods contain carbohydrates? Carbohydrates are found in the following foods: Grains, such as breads and cereals. Dried beans and soy products. Starchy vegetables, such as potatoes, peas, and corn. Fruit and fruit juices. Milk and yogurt. Sweets and snack foods, such as cake, cookies, candy, chips, and soft drinks. How do I count carbohydrates in foods? There are two ways to count carbohydrates in food. You can read food labels or learn standard serving sizes of foods. You can use either of the methods or acombination of both. Using the Nutrition Facts label The Nutrition Facts list is included on the labels of almost all packaged foods and beverages in the [place] It includes: The serving size. Information about nutrients in each serving, including the grams (g) of carbohydrate per serving. To use the Nutrition Facts: Decide how many servings you will have. Multiply the number of servings by the number of carbohydrates per serving. The resulting number is the total amount of carbohydrates that you will be having. Learning the standard serving sizes of foods When you eat carbohydrate foods that are not packaged or do not include Nutrition Facts on the label, you need to measure the servings in order to count the  amount of carbohydrates. Measure the foods that you will eat with a food scale or measuring cup, if needed. Decide how many standard-size servings you will eat. Multiply the number of servings by 15. For foods that contain carbohydrates, one serving equals 15 g of carbohydrates. For example, if you eat 2 cups or 10 oz (300 g) of strawberries, you will have eaten 2 servings and 30 g of carbohydrates (2 servings x 15 g = 30 g). For foods that have more than one food mixed, such as soups and casseroles, you must count the carbohydrates in each food that is included. The following list contains standard serving sizes of common carbohydrate-rich foods. Each of these servings has about 15 g of carbohydrates: 1 slice of bread. 1 six-inch (15 cm) tortilla. ? cup or 2 oz (53 g) cooked rice or pasta.  cup or 3 oz (85 g) cooked or canned, drained and rinsed beans or lentils.  cup or 3 oz (85 g) starchy vegetable, such as peas, corn, or squash.  cup or 4 oz (120 g) hot cereal.  cup or 3 oz (85 g) boiled or mashed potatoes, or  or 3 oz (85 g) of a large baked potato.  cup or 4 fl oz (118 mL) fruit juice. 1 cup or 8 fl oz (237 mL) milk. 1 small or 4 oz (106 g) apple.  or 2 oz (63 g) of a medium banana. 1 cup or 5 oz (150 g) strawberries. 3 cups or 1 oz (24 g) popped popcorn. What is an example of carbohydrate counting? To calculate the number of carbohydrates in this sample meal, follow the stepsshown below. Sample meal 3 oz (85 g) chicken breast. ? cup or 4 oz (106 g) Sankey rice.    cup or 3 oz (85 g) corn. 1 cup or 8 fl oz (237 mL) milk. 1 cup or 5 oz (150 g) strawberries with sugar-free whipped topping. Carbohydrate calculation Identify the foods that contain carbohydrates: Rice. Corn. Milk. Strawberries. Calculate how many servings you have of each food: 2 servings rice. 1 serving corn. 1 serving milk. 1 serving strawberries. Multiply each number of servings by 15 g: 2 servings  rice x 15 g = 30 g. 1 serving corn x 15 g = 15 g. 1 serving milk x 15 g = 15 g. 1 serving strawberries x 15 g = 15 g. Add together all of the amounts to find the total grams of carbohydrates eaten: 30 g + 15 g + 15 g + 15 g = 75 g of carbohydrates total. What are tips for following this plan? Shopping Develop a meal plan and then make a shopping list. Buy fresh and frozen vegetables, fresh and frozen fruit, dairy, eggs, beans, lentils, and whole grains. Look at food labels. Choose foods that have more fiber and less sugar. Avoid processed foods and foods with added sugars. Meal planning Aim to have the same amount of carbohydrates at each meal and for each snack time. Plan to have regular, balanced meals and snacks. Where to find more information American Diabetes Association: www.diabetes.org Centers for Disease Control and Prevention: www.cdc.gov Summary Carbohydrate counting is a method of keeping track of how many carbohydrates you eat. Eating carbohydrates naturally increases the amount of sugar (glucose) in the blood. Counting how many carbohydrates you eat improves your blood glucose control, which helps you manage your diabetes. A dietitian can help you make a meal plan and calculate how many carbohydrates you should have at each meal and snack. This information is not intended to replace advice given to you by your health care provider. Make sure you discuss any questions you have with your healthcare provider. Document Revised: 10/23/2019 Document Reviewed: 10/24/2019 Elsevier Patient Education  2021 Elsevier Inc.  

## 2021-06-28 NOTE — Assessment & Plan Note (Signed)
Well controlled, no changes to meds. Encouraged heart healthy diet such as the DASH diet and exercise as tolerated.  °

## 2021-06-28 NOTE — Progress Notes (Signed)
Subjective:   By signing my name below, I, Shehryar Baig, attest that this documentation has been prepared under the direction and in the presence of Dr. [name], DO. 06/28/2021    Patient ID: Rhonda Morrow, female    DOB: 15-May-1961, 60 y.o.   MRN: [id number]  Chief Complaint  Patient presents with   Hypertension   Hyperlipidemia   Diabetes   Follow-up    Hypertension  Hyperlipidemia  Diabetes  Patient is in today for a office visit.  She continues having mild cough and congestion. She takes OTC cough and cold medication to manage her symptoms and reports no new issues.  She complains getting abdominal pain after sneezing or coughing hard. She continues taking 20 mg omeprazole daily PO an reports no new issues while taking it. She is also requesting a refill on it.  She does not regularly check her blood sugar because she does not have a machine and needles to measure it. She is requesting for a prescription to get one after this visit. She continues taking 500 mg metformin 2x daily PO and reports no new issues while taking it.  Lab Results  Component Value Date   HGBA1C 7.2 (H) 04/05/2021   Her blood pressure is doing well during this visit. She continues taking 12.5 hydrochlorothiazide daily PO, 5 mg amlodipine daily PO and reports no new issues while taking them. She denies having any leg swelling at this time. She reports that she elevates her feet regularly and is planning to get compression socks as well.   BP Readings from Last 3 Encounters:  06/28/21 114/86  06/21/21 120/78  06/02/21 126/82    Past Medical History:  Diagnosis Date   Arthritis    Asthma    Complication of anesthesia    one time woke up and was vey scared,17 yrs ago   Deaf    Diabetes mellitus without complication ([place])    GERD (gastroesophageal reflux disease)    Hypertension    Left groin pain    Thyroid disease     Past Surgical History:  Procedure Laterality Date    CARDIAC CATHETERIZATION     CESAREAN SECTION     CHOLECYSTECTOMY N/A 06/20/2016   Procedure: LAPAROSCOPIC CHOLECYSTECTOMY;  Surgeon: Rolm Bookbinder, MD;  Location: [facility];  Service: General;  Laterality: N/A;   ESOPHAGEAL MANOMETRY N/A 09/29/2013   Procedure: ESOPHAGEAL MANOMETRY (EM);  Surgeon: Milus Banister, MD;  Location: [facility];  Service: Endoscopy;  Laterality: N/A;   TOTAL HIP ARTHROPLASTY Left 02/21/2017   Procedure: LEFT TOTAL HIP ARTHROPLASTY ANTERIOR APPROACH;  Surgeon: Leandrew Koyanagi, MD;  Location: [facility];  Service: Orthopedics;  Laterality: Left;    Family History  Problem Relation Age of Onset   Diabetes Mother    Heart disease Mother    Diabetes Father     Social History   Socioeconomic History   Marital status: Married    Spouse name: Not on file   Number of children: Not on file   Years of education: Not on file   Highest education level: Not on file  Occupational History   Occupation: disabled  Tobacco Use   Smoking status: Never   Smokeless tobacco: Never  Vaping Use   Vaping Use: Never used  Substance and Sexual Activity   Alcohol use: No    Alcohol/week: 0.0 standard drinks   Drug use: No   Sexual activity: Never  Other Topics Concern   Not on file  Social History Narrative   Lives with family.  Has 3 children.  Works for [employer].  Education: high school.   Social Determinants of Health   Financial Resource Strain: Not on file  Food Insecurity: Not on file  Transportation Needs: Not on file  Physical Activity: Not on file  Stress: Not on file  Social Connections: Not on file  Intimate Partner Violence: Not on file    Outpatient Medications Prior to Visit  Medication Sig Dispense Refill   albuterol (VENTOLIN HFA) 108 (90 Base) MCG/ACT inhaler Inhale 1-2 puffs into the lungs every 6 (six) hours as needed for wheezing or shortness of breath. 8 g 6   allopurinol (ZYLOPRIM) 100 MG tablet TAKE 1 TABLET BY MOUTH EVERY DAY 90 tablet 3    amLODipine (NORVASC) 5 MG tablet Take 1 tablet (5 mg total) by mouth daily. 90 tablet 1   aspirin EC 325 MG tablet Take 1 tablet (325 mg total) by mouth 2 (two) times daily. 84 tablet 0   azelastine (ASTELIN) 0.1 % nasal spray Place 2 sprays into both nostrils 2 (two) times daily. Use in each nostril as directed 30 mL 3   blood glucose meter kit and supplies KIT Dispense based on patient and insurance preference. Use up to four times daily as directed. 1 each 0   Calcium Carbonate-Vit D-Min (CALCIUM 1200) 1200-1000 MG-UNIT CHEW 1 po qd 30 tablet 11   cetirizine (ZYRTEC) 10 MG tablet TAKE 1 TABLET BY MOUTH EVERY DAY 90 tablet 1   colchicine 0.6 MG tablet Take 1 tablet (0.6 mg total) by mouth daily. 90 tablet 1   Cyanocobalamin (B-12) 1000 MCG SUBL Dissolove 1 tablet under tongue once a day 90 tablet 1   diclofenac (VOLTAREN) 75 MG EC tablet Take 1 tablet (75 mg total) by mouth 2 (two) times daily as needed. 60 tablet 3   diclofenac sodium (VOLTAREN) 1 % GEL Apply 4 g topically 4 (four) times daily as needed. 500 g 6   escitalopram (LEXAPRO) 10 MG tablet TAKE 1 TABLET BY MOUTH EVERYDAY AT BEDTIME 90 tablet 1   famotidine (PEPCID) 20 MG tablet TAKE 1 TABLET BY MOUTH EVERY DAY *NOT COVERED 30 tablet 0   Fiber CHEW Chew 1 tablet by mouth daily.     fluticasone (FLONASE) 50 MCG/ACT nasal spray SPRAY 2 SPRAYS INTO EACH NOSTRIL EVERY DAY 48 mL 1   hydrochlorothiazide (HYDRODIURIL) 12.5 MG tablet Take 1 tablet (12.5 mg total) by mouth daily. 90 tablet 1   hydrOXYzine (ATARAX/VISTARIL) 10 MG tablet 1-2 tab po qhs prn 180 tablet 1   ipratropium-albuterol (DUONEB) 0.5-2.5 (3) MG/3ML SOLN Take 3 mLs by nebulization every 6 (six) hours as needed. 360 mL 3   Lancets (ONETOUCH ULTRASOFT) lancets Use as instructed 100 each 12   meclizine (ANTIVERT) 12.5 MG tablet Take 1 tablet (12.5 mg total) by mouth 3 (three) times daily as needed for dizziness. 30 tablet 0   meloxicam (MOBIC) 7.5 MG tablet 1-2 po qd prn 60  tablet 1   metaxalone (SKELAXIN) 800 MG tablet Take 1 tablet (800 mg total) by mouth 3 (three) times daily. 30 tablet 2   metFORMIN (GLUCOPHAGE-XR) 500 MG 24 hr tablet TAKE 2 TABLET BY MOUTH EVERY DAY WITH BREAKFAST 180 tablet 0   montelukast (SINGULAIR) 10 MG tablet Take 1 tablet (10 mg total) by mouth at bedtime. 90 tablet 3   Multiple Vitamin (MULTIVITAMIN) tablet Take 1 tablet by mouth daily.     NONFORMULARY  OR COMPOUNDED ITEM Nebulizer   DX ASTHMA 1 each 0   nystatin-triamcinolone (MYCOLOG II) cream Apply twice daily to lower chest area/upper abdomen 30 g 0   ofloxacin (FLOXIN OTIC) 0.3 % OTIC solution Place 10 drops into the right ear daily. 10 mL 0   omeprazole (PRILOSEC) 20 MG capsule Take 1 capsule (20 mg total) by mouth daily. 90 capsule 3   ondansetron (ZOFRAN ODT) 4 MG disintegrating tablet Take 1 tablet (4 mg total) by mouth every 8 (eight) hours as needed for nausea or vomiting. 20 tablet 0   ONETOUCH VERIO test strip USE AS INSTRUCTED--- ONE TOUCH VERIO TEST STRIPS 100 strip 12   potassium chloride SA (KLOR-CON M20) 20 MEQ tablet Take 1 tablet (20 mEq total) by mouth daily. 90 tablet 1   rOPINIRole (REQUIP) 0.25 MG tablet TAKE 1 TABLET BY MOUTH AT BEDTIME FOR 4 DAYS THEN INCREASE TO 2 TABLETS AT BEDTIME IF NEEDED 180 tablet 1   rosuvastatin (CRESTOR) 10 MG tablet TAKE 1 TABLET BY MOUTH EVERY DAY 90 tablet 1   Spacer/Aero-Holding Chambers (AEROCHAMBER MV) inhaler Use as instructed 1 each 0   traMADol (ULTRAM) 50 MG tablet TAKE 1 TABLET (50 MG TOTAL) BY MOUTH EVERY 8 (EIGHT) HOURS AS NEEDED FOR UP TO 5 DAYS. 16 tablet 0   triamcinolone cream (KENALOG) 0.1 % Apply bid to hands 30 g 0   blood glucose meter kit and supplies KIT Dispense based on patient and insurance preference. Use up to four times daily as directed. (FOR ICD-9 250.00, 250.01). 1 each 0   fluticasone (FLONASE) 50 MCG/ACT nasal spray Place 2 sprays into both nostrils daily. 16 g 6   omeprazole (PRILOSEC) 20 MG capsule  Take 1 capsule (20 mg total) by mouth daily. 30 capsule 3   promethazine-dextromethorphan (PROMETHAZINE-DM) 6.25-15 MG/5ML syrup Take 5 mLs by mouth 4 (four) times daily as needed. 118 mL 0   SYMBICORT 160-4.5 MCG/ACT inhaler Inhale 2 puffs into the lungs every 12 (twelve) hours. 1 each 8   COVID-19 mRNA vaccine, Pfizer, 30 MCG/0.3ML injection INJECT AS DIRECTED (Patient not taking: Reported on 06/28/2021) .3 mL 0   diclofenac (VOLTAREN) 75 MG EC tablet Take 1 tablet (75 mg total) by mouth 2 (two) times daily. (Patient not taking: Reported on 06/28/2021) 50 tablet 2   Facility-Administered Medications Prior to Visit  Medication Dose Route Frequency Provider Last Rate Last Admin   alum & mag hydroxide-simeth (MAALOX/MYLANTA) 200-200-20 MG/5ML suspension 30 mL  30 mL Oral Once Saguier, Edward, PA-C       hyoscyamine (LEVSIN SL) SL tablet 0.125 mg  0.125 mg Sublingual Once Saguier, Edward, PA-C       lidocaine (XYLOCAINE) 2 % viscous mouth solution 15 mL  15 mL Mouth/Throat Once Saguier, Edward, PA-C       nitroGLYCERIN (NITRODUR - Dosed in mg/24 hr) patch 0.2 mg  0.2 mg Transdermal Daily Regal, Tamala Fothergill, DPM        Allergies  Allergen Reactions   Losartan Shortness Of Breath   Oxycodone-Acetaminophen Nausea And Vomiting   Augmentin [Amoxicillin-Pot Clavulanate] Diarrhea    Review of Systems  HENT:  Positive for congestion.   Respiratory:  Positive for cough.   Gastrointestinal:  Positive for abdominal pain.      Objective:    Physical Exam Vitals and nursing note reviewed.  Constitutional:      General: She is not in acute distress.    Appearance: Normal appearance. She is not ill-appearing.  HENT:     Head: Normocephalic and atraumatic.     Right Ear: External ear normal.     Left Ear: External ear normal.  Eyes:     Extraocular Movements: Extraocular movements intact.     Pupils: Pupils are equal, round, and reactive to light.  Cardiovascular:     Rate and Rhythm: Normal rate  and regular rhythm.     Heart sounds: Normal heart sounds. No murmur heard.   No gallop.  Pulmonary:     Effort: Pulmonary effort is normal. No respiratory distress.     Breath sounds: Normal breath sounds. No wheezing or rales.  Abdominal:     Palpations: Abdomen is soft.     Tenderness: There is abdominal tenderness in the epigastric area. There is guarding.  Skin:    General: Skin is warm and dry.  Neurological:     Mental Status: She is alert and oriented to person, place, and time.  Psychiatric:        Behavior: Behavior normal.        Judgment: Judgment normal.    BP 114/86 (BP Location: Right Arm, Patient Position: Sitting, Cuff Size: Large)   Pulse 90   Temp 99.6 F (37.6 C) (Oral)   Resp 18   Ht 5' 4" (1.626 m)   Wt 232 lb 6.4 oz (105.4 kg)   LMP 05/20/2012   SpO2 97%   BMI 39.89 kg/m  Wt Readings from Last 3 Encounters:  06/28/21 232 lb 6.4 oz (105.4 kg)  06/21/21 236 lb (107 kg)  06/02/21 236 lb 12.8 oz (107.4 kg)    Diabetic Foot Exam - Simple   No data filed    Lab Results  Component Value Date   WBC 4.8 02/22/2021   HGB 13.9 02/22/2021   HCT 42.5 02/22/2021   PLT 138.0 (L) 02/22/2021   GLUCOSE 168 (H) 04/05/2021   CHOL 145 04/05/2021   TRIG 96.0 04/05/2021   HDL 54.60 04/05/2021   LDLCALC 71 04/05/2021   ALT 18 04/05/2021   AST 15 04/05/2021   NA 141 04/05/2021   K 3.4 (L) 04/05/2021   CL 102 04/05/2021   CREATININE 0.81 04/05/2021   BUN 13 04/05/2021   CO2 30 04/05/2021   TSH 0.74 04/20/2020   INR 1.05 02/12/2017   HGBA1C 7.2 (H) 04/05/2021   MICROALBUR 2.6 (H) 03/22/2021    Lab Results  Component Value Date   TSH 0.74 04/20/2020   Lab Results  Component Value Date   WBC 4.8 02/22/2021   HGB 13.9 02/22/2021   HCT 42.5 02/22/2021   MCV 84.1 02/22/2021   PLT 138.0 (L) 02/22/2021   Lab Results  Component Value Date   NA 141 04/05/2021   K 3.4 (L) 04/05/2021   CO2 30 04/05/2021   GLUCOSE 168 (H) 04/05/2021   BUN 13  04/05/2021   CREATININE 0.81 04/05/2021   BILITOT 0.3 04/05/2021   ALKPHOS 80 04/05/2021   AST 15 04/05/2021   ALT 18 04/05/2021   PROT 6.3 04/05/2021   ALBUMIN 3.7 04/05/2021   CALCIUM 9.5 04/05/2021   ANIONGAP 13 05/24/2020   GFR 80.50 04/05/2021   Lab Results  Component Value Date   CHOL 145 04/05/2021   Lab Results  Component Value Date   HDL 54.60 04/05/2021   Lab Results  Component Value Date   LDLCALC 71 04/05/2021   Lab Results  Component Value Date   TRIG 96.0 04/05/2021   Lab Results  Component Value  Date   CHOLHDL 3 04/05/2021   Lab Results  Component Value Date   HGBA1C 7.2 (H) 04/05/2021       Assessment & Plan:   Problem List Items Addressed This Visit       Unprioritized   Acute non-recurrent maxillary sinusitis   Relevant Medications   fluticasone (FLONASE) 50 MCG/ACT nasal spray   promethazine-dextromethorphan (PROMETHAZINE-DM) 6.25-15 MG/5ML syrup   Dyspepsia   Relevant Medications   omeprazole (PRILOSEC) 20 MG capsule   Diabetes mellitus, type II ([place])    hgba1c to be checked , minimize simple carbs. Increase exercise as tolerated. Continue current meds       HTN (hypertension)    Well controlled, no changes to meds. Encouraged heart healthy diet such as the DASH diet and exercise as tolerated.       Hyperlipidemia associated with type 2 diabetes mellitus ([place]) - Primary    Encourage heart healthy diet such as MIND or DASH diet, increase exercise, avoid trans fats, simple carbohydrates and processed foods, consider a krill or fish or flaxseed oil cap daily.       Relevant Orders   Lipid panel   Comprehensive metabolic panel   Other Visit Diagnoses     Bronchitis       Relevant Medications   promethazine-dextromethorphan (PROMETHAZINE-DM) 6.25-15 MG/5ML syrup   Mild intermittent asthma, unspecified whether complicated       Relevant Medications   SYMBICORT 160-4.5 MCG/ACT inhaler   Uncontrolled type 2 diabetes mellitus with  hyperglycemia ([facility])       Relevant Medications   blood glucose meter kit and supplies KIT   Other Relevant Orders   Hemoglobin A1c   Comprehensive metabolic panel        Meds ordered this encounter  Medications   fluticasone (FLONASE) 50 MCG/ACT nasal spray    Sig: Place 2 sprays into both nostrils daily.    Dispense:  16 g    Refill:  6   promethazine-dextromethorphan (PROMETHAZINE-DM) 6.25-15 MG/5ML syrup    Sig: Take 5 mLs by mouth 4 (four) times daily as needed.    Dispense:  118 mL    Refill:  0   SYMBICORT 160-4.5 MCG/ACT inhaler    Sig: Inhale 2 puffs into the lungs every 12 (twelve) hours.    Dispense:  1 each    Refill:  8    Order Specific Question:   Lot Number?    Answer:   [id number]    Order Specific Question:   Expiration Date?    Answer:   11/27/2019    Order Specific Question:   Manufacturer?    Answer:   [employer] [[id number]]    Order Specific Question:   Quantity    Answer:   1   blood glucose meter kit and supplies KIT    Sig: Dispense based on patient and insurance preference. Use up to four times daily as directed. (FOR ICD-9 250.00, 250.01).    Dispense:  1 each    Refill:  0    Order Specific Question:   Number of strips    Answer:   100    Order Specific Question:   Number of lancets    Answer:   100   omeprazole (PRILOSEC) 20 MG capsule    Sig: Take 1 capsule (20 mg total) by mouth 2 (two) times daily before a meal.    Dispense:  60 capsule    Refill:  3    I, Dr. [name],  DO, personally preformed the services described in this documentation.  All medical record entries made by the scribe were at my direction and in my presence.  I have reviewed the chart and discharge instructions (if applicable) and agree that the record reflects my personal performance and is accurate and complete. 06/28/2021   I,Shehryar Baig,acting as a scribe for Ann Held, DO.,have documented all relevant documentation on the behalf of Ann Held, DO,as directed by  Ann Held, DO while in the presence of Ann Held, DO.   Ann Held, DO

## 2021-06-28 NOTE — Assessment & Plan Note (Signed)
hgba1c to be checked, minimize simple carbs. Increase exercise as tolerated. Continue current meds  

## 2021-06-29 ENCOUNTER — Emergency Department (HOSPITAL_BASED_OUTPATIENT_CLINIC_OR_DEPARTMENT_OTHER): Payer: Federal, State, Local not specified - PPO

## 2021-06-29 ENCOUNTER — Other Ambulatory Visit: Payer: Self-pay

## 2021-06-29 ENCOUNTER — Emergency Department (HOSPITAL_BASED_OUTPATIENT_CLINIC_OR_DEPARTMENT_OTHER)
Admission: EM | Admit: 2021-06-29 | Discharge: 2021-06-29 | Disposition: A | Discharge status: Home / Self Care | Payer: Federal, State, Local not specified - PPO | Attending: Emergency Medicine | Admitting: Emergency Medicine

## 2021-06-29 ENCOUNTER — Encounter (HOSPITAL_BASED_OUTPATIENT_CLINIC_OR_DEPARTMENT_OTHER): Payer: Self-pay

## 2021-06-29 DIAGNOSIS — E1169 Type 2 diabetes mellitus with other specified complication: Secondary | ICD-10-CM | POA: Insufficient documentation

## 2021-06-29 DIAGNOSIS — E785 Hyperlipidemia, unspecified: Secondary | ICD-10-CM | POA: Diagnosis not present

## 2021-06-29 DIAGNOSIS — E1151 Type 2 diabetes mellitus with diabetic peripheral angiopathy without gangrene: Secondary | ICD-10-CM | POA: Diagnosis not present

## 2021-06-29 DIAGNOSIS — U071 COVID-19: Secondary | ICD-10-CM

## 2021-06-29 DIAGNOSIS — Z7951 Long term (current) use of inhaled steroids: Secondary | ICD-10-CM | POA: Diagnosis not present

## 2021-06-29 DIAGNOSIS — Z7984 Long term (current) use of oral hypoglycemic drugs: Secondary | ICD-10-CM | POA: Insufficient documentation

## 2021-06-29 DIAGNOSIS — R059 Cough, unspecified: Secondary | ICD-10-CM | POA: Diagnosis not present

## 2021-06-29 DIAGNOSIS — I517 Cardiomegaly: Secondary | ICD-10-CM | POA: Diagnosis not present

## 2021-06-29 DIAGNOSIS — I10 Essential (primary) hypertension: Secondary | ICD-10-CM | POA: Diagnosis not present

## 2021-06-29 DIAGNOSIS — Z79899 Other long term (current) drug therapy: Secondary | ICD-10-CM | POA: Diagnosis not present

## 2021-06-29 DIAGNOSIS — J4540 Moderate persistent asthma, uncomplicated: Secondary | ICD-10-CM | POA: Insufficient documentation

## 2021-06-29 DIAGNOSIS — Z96642 Presence of left artificial hip joint: Secondary | ICD-10-CM | POA: Diagnosis not present

## 2021-06-29 DIAGNOSIS — Z7982 Long term (current) use of aspirin: Secondary | ICD-10-CM | POA: Insufficient documentation

## 2021-06-29 DIAGNOSIS — E1149 Type 2 diabetes mellitus with other diabetic neurological complication: Secondary | ICD-10-CM | POA: Insufficient documentation

## 2021-06-29 DIAGNOSIS — K2900 Acute gastritis without bleeding: Secondary | ICD-10-CM | POA: Insufficient documentation

## 2021-06-29 LAB — RESP PANEL BY RT-PCR (FLU A&B, COVID) ARPGX2
Influenza A by PCR: NEGATIVE
Influenza B by PCR: NEGATIVE
SARS Coronavirus 2 by RT PCR: POSITIVE — AB

## 2021-06-29 MED ORDER — MOLNUPIRAVIR EUA 200MG CAPSULE: 4.0000 | ORAL_CAPSULE | Freq: Two times a day (BID) | ORAL | 0 refills | Status: AC

## 2021-06-29 NOTE — ED Provider Notes (Signed)
[facility] EMERGENCY DEPARTMENT Provider Note   CSN: [id number] Arrival date & time: 06/29/21  1153     History Chief Complaint  Patient presents with   Cough    Rhonda Morrow is a 60 y.o. female.  She has a past medical history of hypertension, obesity, GERD, Diabetes type II, Hyperlipidemia, bilateral deafness.   She states that she started feeling sick on Saturday.  She had a bad cough all day, runny nose.  Yesterday she developed a sore throat, and had 2 episodes of emesis along with decreased appetite. She has associated epigastric pain.  She talked spoke to her PCP yesterday, and they prescribed her promethazine and Flonase for her symptoms.  She states that these have not helped her.  She spoke with her doctor again and they suggested that she go to the ED or urgent care for further testing.  She endorses having chills.  She has not been able to take her temperature at home because she does not have a thermometer.  she denies fevers,  shortness of breath,  diarrhea,  constipation,  hematemesis,  headaches.       Past Medical History:  Diagnosis Date   Arthritis    Asthma    Complication of anesthesia    one time woke up and was vey scared,17 yrs ago   Deaf    Diabetes mellitus without complication ([place])    GERD (gastroesophageal reflux disease)    Hypertension    Left groin pain    Thyroid disease     Patient Active Problem List   Diagnosis Date Noted   Right wrist pain 06/05/2021   Acute pain of left shoulder 01/24/2021   Acute pain of right shoulder 01/24/2021   Foot pain, bilateral 01/24/2021   Dyspepsia 01/24/2021   Preventative health care 01/24/2021   Right hand pain 12/29/2020   OSA (obstructive sleep apnea) 09/12/2020   Nocturia more than twice per night 08/03/2020   Non-restorative sleep 08/03/2020   Leg cramping 08/03/2020   Hyperlipidemia associated with type 2 diabetes mellitus ([place]) 07/25/2020   Large breasts 07/22/2020   RLS (restless  legs syndrome) 05/17/2020   Cough 04/01/2020   Moderate persistent asthma without complication 88/41/6606   Medication management 04/01/2020   Menopausal symptoms 03/29/2020   Hot flashes 03/29/2020   Chronic right shoulder pain 08/11/2019   Acute non-recurrent maxillary sinusitis 08/11/2019   BMI 40.0-44.9, adult ([place]) 01/22/2019   Right lower quadrant abdominal pain 10/20/2018   Rectal pain 10/20/2018   Vitamin B 12 deficiency 03/28/2018   B12 deficiency 03/28/2018   DM (diabetes mellitus) type II uncontrolled, periph vascular disorder ([place]) 03/28/2018   Hyperlipidemia 03/28/2018   Primary osteoarthritis of left hip 02/21/2017   Status post total hip replacement, left 02/21/2017   History of laparoscopic cholecystectomy 06/20/2016   Cerumen impaction 06/09/2016   Hypersomnia 05/16/2016   Knee pain, right 05/05/2016   RUQ pain 04/09/2016   Abnormal CT scan 03/28/2016   Dyspnea and respiratory abnormalities 03/15/2016   Asthma with acute exacerbation 03/15/2016   Asthma in adult 02/25/2016   DM (diabetes mellitus) type II controlled, neurological manifestation ([place]) 02/09/2016   Left hip pain 12/23/2015   Right hamstring muscle strain 11/18/2015   Abdominal pain, acute 09/01/2015   Acute asthma exacerbation 04/16/2015   Acute bronchitis 04/14/2015   Pap smear for cervical cancer screening 09/14/2014   Bed bug bite 05/06/2014   Allergic rhinitis 04/09/2014   Rectal itching 04/09/2014   Bladder  spasm 04/09/2014   Knee pain, left 03/05/2014   Hypokalemia 02/19/2014   Nausea with vomiting 02/19/2014   Diabetes mellitus, type II ([place]) 02/19/2014   Edema 02/16/2014   Disorder of rotator cuff 11/12/2013   Routine general medical examination at a health care facility 08/20/2013   Gastroesophageal reflux disease 06/26/2013   Dysphagia, pharyngoesophageal phase 06/26/2013   Medication side effect 03/25/2013   Diarrhea 03/25/2013   Benign positional vertigo 01/30/2013    Traumatic hematoma of thigh 01/15/2013   HTN (hypertension) 09/02/2012   Dry skin 08/22/2012   External hemorrhoids 08/22/2012   Obesity 06/30/2012   Thyromegaly 05/22/2012   Back pain 05/22/2012   Elevated glucose 05/22/2012   Moderate persistent asthma 04/19/2012   Dyspnea 01/28/2012    Past Surgical History:  Procedure Laterality Date   [place] N/A 06/20/2016   Procedure: LAPAROSCOPIC CHOLECYSTECTOMY;  Surgeon: Rolm Bookbinder, MD;  Location: [facility];  Service: General;  Laterality: N/A;   ESOPHAGEAL MANOMETRY N/A 09/29/2013   Procedure: ESOPHAGEAL MANOMETRY (EM);  Surgeon: Milus Banister, MD;  Location: [facility];  Service: Endoscopy;  Laterality: N/A;   TOTAL HIP ARTHROPLASTY Left 02/21/2017   Procedure: LEFT TOTAL HIP ARTHROPLASTY ANTERIOR APPROACH;  Surgeon: Leandrew Koyanagi, MD;  Location: [facility];  Service: Orthopedics;  Laterality: Left;     OB History     Gravida  3   Para      Term      Preterm      AB      Living  3      SAB      IAB      Ectopic      Multiple      Live Births              Family History  Problem Relation Age of Onset   Diabetes Mother    Heart disease Mother    Diabetes Father     Social History   Tobacco Use   Smoking status: Never   Smokeless tobacco: Never  Vaping Use   Vaping Use: Never used  Substance Use Topics   Alcohol use: No    Alcohol/week: 0.0 standard drinks   Drug use: No    Home Medications Prior to Admission medications   Medication Sig Start Date End Date Taking? Authorizing Provider  molnupiravir EUA 200 mg CAPS Take 4 capsules (800 mg total) by mouth 2 (two) times daily for 5 days. 06/29/21 07/04/21 Yes Kaden Dunkel, Adora Fridge, PA-C  albuterol (VENTOLIN HFA) 108 (90 Base) MCG/ACT inhaler Inhale 1-2 puffs into the lungs every 6 (six) hours as needed for wheezing or shortness of breath. 04/29/21   Icard, Leory Plowman L, DO  allopurinol (ZYLOPRIM) 100  MG tablet TAKE 1 TABLET BY MOUTH EVERY DAY 06/17/21   Carollee Herter, Yvonne R, DO  amLODipine (NORVASC) 5 MG tablet Take 1 tablet (5 mg total) by mouth daily. 03/21/21   Ann Held, DO  aspirin EC 325 MG tablet Take 1 tablet (325 mg total) by mouth 2 (two) times daily. 02/21/17   Leandrew Koyanagi, MD  azelastine (ASTELIN) 0.1 % nasal spray Place 2 sprays into both nostrils 2 (two) times daily. Use in each nostril as directed 09/01/16   Carollee Herter, Alferd Apa, DO  blood glucose meter kit and supplies KIT Dispense based on patient and insurance preference. Use up to four times daily as directed.  03/22/21   [name] R, DO  blood glucose meter kit and supplies KIT Dispense based on patient and insurance preference. Use up to four times daily as directed. (FOR ICD-9 250.00, 250.01). 06/28/21   Carollee Herter, Alferd Apa, DO  Calcium Carbonate-Vit D-Min (CALCIUM 1200) 1200-1000 MG-UNIT CHEW 1 po qd 03/29/20   Carollee Herter, Kendrick Fries R, DO  cetirizine (ZYRTEC) 10 MG tablet TAKE 1 TABLET BY MOUTH EVERY DAY 06/10/21   [name] R, DO  colchicine 0.6 MG tablet Take 1 tablet (0.6 mg total) by mouth daily. 05/30/19   Ann Held, DO  Cyanocobalamin (B-12) 1000 MCG SUBL Dissolove 1 tablet under tongue once a day 01/04/21   Carollee Herter, Alferd Apa, DO  diclofenac (VOLTAREN) 75 MG EC tablet Take 1 tablet (75 mg total) by mouth 2 (two) times daily as needed. 01/24/21   [name] R, DO  diclofenac sodium (VOLTAREN) 1 % GEL Apply 4 g topically 4 (four) times daily as needed. 11/19/18   Hilts, Legrand Como, MD  escitalopram (LEXAPRO) 10 MG tablet TAKE 1 TABLET BY MOUTH EVERYDAY AT BEDTIME 03/07/21   Carollee Herter, Yvonne R, DO  famotidine (PEPCID) 20 MG tablet TAKE 1 TABLET BY MOUTH EVERY DAY *NOT COVERED 03/17/21   Saguier, Percell Miller, PA-C  Fiber CHEW Chew 1 tablet by mouth daily.    [provider]  fluticasone (FLONASE) 50 MCG/ACT nasal spray SPRAY 2 SPRAYS INTO EACH NOSTRIL EVERY DAY 05/31/20    Carollee Herter, Alferd Apa, DO  fluticasone (FLONASE) 50 MCG/ACT nasal spray Place 2 sprays into both nostrils daily. 06/28/21   Ann Held, DO  hydrochlorothiazide (HYDRODIURIL) 12.5 MG tablet Take 1 tablet (12.5 mg total) by mouth daily. 04/25/21   [name] R, DO  hydrOXYzine (ATARAX/VISTARIL) 10 MG tablet 1-2 tab po qhs prn 03/22/21   Lowne Chase, Alferd Apa, DO  ipratropium-albuterol (DUONEB) 0.5-2.5 (3) MG/3ML SOLN Take 3 mLs by nebulization every 6 (six) hours as needed. 11/21/18   Ann Held, DO  Lancets [facility] ULTRASOFT) lancets Use as instructed 02/25/20   Carollee Herter, Alferd Apa, DO  meclizine (ANTIVERT) 12.5 MG tablet Take 1 tablet (12.5 mg total) by mouth 3 (three) times daily as needed for dizziness. 12/19/17   Saguier, Percell Miller, PA-C  meloxicam (MOBIC) 7.5 MG tablet 1-2 po qd prn 06/02/21   Carollee Herter, Alferd Apa, DO  metaxalone (SKELAXIN) 800 MG tablet Take 1 tablet (800 mg total) by mouth 3 (three) times daily. 01/24/21   [name] R, DO  metFORMIN (GLUCOPHAGE-XR) 500 MG 24 hr tablet TAKE 2 TABLET BY MOUTH EVERY DAY WITH BREAKFAST 06/17/21   Carollee Herter, Yvonne R, DO  montelukast (SINGULAIR) 10 MG tablet Take 1 tablet (10 mg total) by mouth at bedtime. 11/21/18   Ann Held, DO  Multiple Vitamin (MULTIVITAMIN) tablet Take 1 tablet by mouth daily.    [provider]  NONFORMULARY OR COMPOUNDED ITEM Nebulizer   DX ASTHMA 02/25/16   Carollee Herter, Alferd Apa, DO  nystatin-triamcinolone [facility] II) cream Apply twice daily to lower chest area/upper abdomen 05/12/19   Saguier, Percell Miller, PA-C  ofloxacin (FLOXIN OTIC) 0.3 % OTIC solution Place 10 drops into the right ear daily. 04/08/20   Ann Held, DO  omeprazole (PRILOSEC) 20 MG capsule Take 1 capsule (20 mg total) by mouth daily. 08/23/20   Ann Held, DO  omeprazole (PRILOSEC) 20 MG capsule Take 1 capsule (20  mg total) by mouth 2 (two) times daily before a meal. 06/28/21   Carollee Herter, Alferd Apa, DO  ondansetron (ZOFRAN ODT) 4 MG disintegrating tablet Take 1 tablet (4 mg total) by mouth every 8 (eight) hours as needed for nausea or vomiting. 05/24/20   Eustaquio Maize, PA-C  ONETOUCH VERIO test strip USE AS INSTRUCTED--- ONE TOUCH VERIO TEST STRIPS 03/21/21   Carollee Herter, Alferd Apa, DO  potassium chloride SA (KLOR-CON M20) 20 MEQ tablet Take 1 tablet (20 mEq total) by mouth daily. 02/25/20   Ann Held, DO  promethazine-dextromethorphan (PROMETHAZINE-DM) 6.25-15 MG/5ML syrup Take 5 mLs by mouth 4 (four) times daily as needed. 06/28/21   Carollee Herter, Yvonne R, DO  rOPINIRole (REQUIP) 0.25 MG tablet TAKE 1 TABLET BY MOUTH AT BEDTIME FOR 4 DAYS THEN INCREASE TO 2 TABLETS AT BEDTIME IF NEEDED 03/21/21   Carollee Herter, Kendrick Fries R, DO  rosuvastatin (CRESTOR) 10 MG tablet TAKE 1 TABLET BY MOUTH EVERY DAY 04/25/21   Carollee Herter, Alferd Apa, DO  Spacer/Aero-Holding Chambers (AEROCHAMBER MV) inhaler Use as instructed 11/26/18   Icard, Octavio Graves, DO  SYMBICORT 160-4.5 MCG/ACT inhaler Inhale 2 puffs into the lungs every 12 (twelve) hours. 06/28/21   Ann Held, DO  traMADol (ULTRAM) 50 MG tablet TAKE 1 TABLET (50 MG TOTAL) BY MOUTH EVERY 8 (EIGHT) HOURS AS NEEDED FOR UP TO 5 DAYS. 07/29/19   Carollee Herter, Alferd Apa, DO  triamcinolone cream (KENALOG) 0.1 % Apply bid to hands 05/12/19   Saguier, Percell Miller, PA-C    Allergies    Losartan, Oxycodone-acetaminophen, and Augmentin [amoxicillin-pot clavulanate]  Review of Systems   Review of Systems  Constitutional:  Positive for appetite change and chills. Negative for fatigue and fever.  HENT:  Positive for congestion, ear pain, rhinorrhea and sore throat. Negative for sinus pressure and sinus pain.   Eyes:  Negative for visual disturbance.  Respiratory:  Positive for cough. Negative for shortness of breath.   Cardiovascular:  Negative for chest pain.  Gastrointestinal:  Positive for abdominal pain, nausea and vomiting. Negative for blood  in stool, constipation and diarrhea.  Neurological:  Negative for dizziness, syncope, light-headedness and headaches.  All other systems reviewed and are negative.  Physical Exam Updated Vital Signs BP (!) 144/75 (BP Location: Right Arm)   Pulse 85   Temp 98.8 F (37.1 C) (Oral)   Resp 20   Ht 5' 4"  (1.626 m)   Wt 104.8 kg   LMP 05/20/2012   SpO2 99%   BMI 39.65 kg/m   Physical Exam Vitals and nursing note reviewed.  Constitutional:      General: She is not in acute distress.    Appearance: She is obese. She is not ill-appearing, toxic-appearing or diaphoretic.  HENT:     Head: Normocephalic and atraumatic.     Right Ear: Tympanic membrane normal.     Left Ear: Tympanic membrane normal.     Nose: Nose normal. No congestion or rhinorrhea.     Mouth/Throat:     Mouth: Mucous membranes are moist.     Pharynx: Oropharynx is clear. No oropharyngeal exudate or posterior oropharyngeal erythema.  Eyes:     General: No scleral icterus.       Right eye: No discharge.        Left eye: No discharge.     Conjunctiva/sclera: Conjunctivae normal.  Cardiovascular:     Rate and Rhythm: Normal rate and regular rhythm.  Pulses: Normal pulses.     Heart sounds: Normal heart sounds, S1 normal and S2 normal. No murmur heard.   No friction rub. No gallop.  Pulmonary:     Effort: Pulmonary effort is normal. No respiratory distress.     Breath sounds: Normal breath sounds. No wheezing, rhonchi or rales.  Abdominal:     General: Abdomen is flat. Bowel sounds are normal. There is no distension.     Palpations: Abdomen is soft. There is no pulsatile mass.     Tenderness: There is abdominal tenderness in the epigastric area. There is no guarding or rebound.  Musculoskeletal:     Right lower leg: No edema.     Left lower leg: No edema.  Skin:    General: Skin is warm and dry.     Coloration: Skin is not jaundiced.     Findings: No bruising, erythema, lesion or rash.  Neurological:      General: No focal deficit present.     Mental Status: She is alert and oriented to person, place, and time.  Psychiatric:        Mood and Affect: Mood normal.        Behavior: Behavior normal.    ED Results / Procedures / Treatments   Labs (all labs ordered are listed, but only abnormal results are displayed) Labs Reviewed  RESP PANEL BY RT-PCR (FLU A&B, COVID) ARPGX2 - Abnormal; Notable for the following components:      Result Value   SARS Coronavirus 2 by RT PCR POSITIVE (*)    All other components within normal limits    EKG None  Radiology DG Chest Portable 1 View  Result Date: 06/29/2021 CLINICAL DATA:  Cough. EXAM: PORTABLE CHEST 1 VIEW COMPARISON:  February 08, 2021. FINDINGS: Mild cardiomegaly is noted. Both lungs are clear. The visualized skeletal structures are unremarkable. IMPRESSION: No active disease. Electronically Signed   By: Marijo Conception M.D.   On: 06/29/2021 13:00    Procedures Procedures   Medications Ordered in ED Medications - No data to display  ED Course  I have reviewed the triage vital signs and the nursing notes.  Pertinent labs & imaging results that were available during my care of the patient were reviewed by me and considered in my medical decision making (see chart for details).    MDM Rules/Calculators/A&P                          This is a well appearing 60 y.o. female who presents with four days of URI symptoms with associated n/v and epigastric abdominal pain. Her vitals are stable on arrival. Physical exam with no signs of respiratory distress or lower respiratory involvement. She tested positive for COVID which is consistent with her symptoms. She likely has associated gastritis. She appears well and passes fluid challenge. She is stable for discharge.   She will be prescribed Molnupiravir to take home. She can continue the promethazine that her other doctor prescribed for her nausea. She can continue taking her home PPI and Pepcid for  gastritis. She is given return precautions if she develops worsening symptoms.    Final Clinical Impression(s) / ED Diagnoses Final diagnoses:  COVID-19  Acute gastritis without hemorrhage, unspecified gastritis type    Rx / DC Orders ED Discharge Orders          Ordered    molnupiravir EUA 200 mg CAPS  2 times daily  06/29/21 1442             Adolphus Birchwood, PA-C 06/29/21 1521    Lucrezia Starch, MD 06/30/21 [phone]

## 2021-06-29 NOTE — Discharge Instructions (Addendum)
You have tested positive for COVID-19.  If you have a fever, please take Tylenol [phone] mg q8h Continue taking Protonix and Pepcid for gastritis Continue taking Promethazine for n/v I have sent you a prescription for an antiviral medication that may help you recover from [place]. Please pick this up from the pharmacy. I have attached a work note for you through Friday.   If your symptoms worsen, please return. Follow up with your PCP if you do not start to get better within 5 days.    If you test positive for COVID-19, stay home for at least 5 days and isolate from others in your home. You are likely most infectious during these first 5 days. Wear a high-quality mask if you must be around others at home and in public. Do not go places where you are unable to wear a mask. For travel guidance, see CDC's Travel webpage. Do not travel. Stay home and separate from others as much as possible. Use a separate bathroom, if possible. Take steps to improve ventilation at home, if possible. Don't share personal household items, like cups, towels, and utensils. Monitor your symptoms. If you have an emergency warning sign (like trouble breathing), seek emergency medical care immediately. Learn more about what to do if you have COVID-19.  If you had symptoms Day 0 of isolation is the day of symptom onset, regardless of when you tested positive Day 1 is the first full day after the day your symptoms started  If you had symptoms You may end isolation after day 5 if: You are fever-free for 24 hours (without the use of fever-reducing medication) Your symptoms are improving If you still have fever or your other symptoms have not improved, continue to isolate until they improve. If you had?moderate illness?(if you experienced shortness of breath or had difficulty breathing), or?severe illness?(you were hospitalized) due to COVID-19, or you have a weakened immune system, you need to isolate through day  10. If you had?severe illness?or have a weakened immune system, consult your doctor before ending isolation. Ending isolation without a viral test may not be an option for you. If you are unsure if your symptoms are moderate or severe or if you have a weakened immune system, talk to a healthcare provider for further guidance.

## 2021-06-29 NOTE — ED Triage Notes (Addendum)
With ASL video interpreter-Pt c/o flu like sx x 4 days-states she was seen by PCP yesterday given meds for cough and nasal spray and advised to come to the ED-pt states she had n/v x 1 today-NAD-steady gait

## 2021-07-03 ENCOUNTER — Encounter: Payer: Self-pay | Admitting: Neurology

## 2021-07-04 ENCOUNTER — Ambulatory Visit: Payer: Federal, State, Local not specified - PPO

## 2021-07-04 DIAGNOSIS — U071 COVID-19: Secondary | ICD-10-CM | POA: Diagnosis not present

## 2021-07-04 DIAGNOSIS — Z20822 Contact with and (suspected) exposure to covid-19: Secondary | ICD-10-CM | POA: Diagnosis not present

## 2021-07-05 ENCOUNTER — Other Ambulatory Visit: Payer: Self-pay

## 2021-07-05 ENCOUNTER — Ambulatory Visit: Payer: Federal, State, Local not specified - PPO | Admitting: Neurology

## 2021-07-05 ENCOUNTER — Encounter: Payer: Self-pay | Admitting: Neurology

## 2021-07-05 VITALS — BP 134/78 | HR 70 | Ht 64.0 in | Wt 230.0 lb

## 2021-07-05 DIAGNOSIS — G2581 Restless legs syndrome: Secondary | ICD-10-CM | POA: Diagnosis not present

## 2021-07-05 DIAGNOSIS — Z9119 Patient's noncompliance with other medical treatment and regimen: Secondary | ICD-10-CM

## 2021-07-05 DIAGNOSIS — F5104 Psychophysiologic insomnia: Secondary | ICD-10-CM

## 2021-07-05 DIAGNOSIS — J301 Allergic rhinitis due to pollen: Secondary | ICD-10-CM | POA: Diagnosis not present

## 2021-07-05 DIAGNOSIS — Z91199 Patient's noncompliance with other medical treatment and regimen due to unspecified reason: Secondary | ICD-10-CM | POA: Insufficient documentation

## 2021-07-05 DIAGNOSIS — G478 Other sleep disorders: Secondary | ICD-10-CM | POA: Diagnosis not present

## 2021-07-05 DIAGNOSIS — U071 COVID-19: Secondary | ICD-10-CM

## 2021-07-05 DIAGNOSIS — Z6841 Body Mass Index (BMI) 40.0 and over, adult: Secondary | ICD-10-CM

## 2021-07-05 MED ORDER — TRAZODONE HCL 100 MG PO TABS: 100.0000 mg | ORAL_TABLET | Freq: Every day | ORAL | 1 refills | Status: DC

## 2021-07-05 MED ORDER — ROPINIROLE HCL 0.25 MG PO TABS: ORAL_TABLET | ORAL | 1 refills | Status: DC

## 2021-07-05 MED ORDER — AZELASTINE HCL 0.1 % NA SOLN: 2.0000 | Freq: Two times a day (BID) | NASAL | 3 refills | Status: DC

## 2021-07-05 NOTE — Progress Notes (Signed)
SLEEP MEDICINE CLINIC    Provider:  Larey Seat, MD  Primary Care Physician:  Ann Held, DO [address] [place] [address]     Referring Provider: Claudette Laws [place],  [place] [address]          Chief Complaint according to patient   Patient presents with:     New Patient (Initial Visit)      pt here  with sign language interpretor, states that she has been having horrible cramps that happen at night time. she doesn't have during the day.cramps and creepy crawly sensations wake her up from sleep. she has to get up walk around rub legs. Started on rRequip.  She has felt tired , more and more- her husband is also deaf and there is no witness to snoring(?)>       HISTORY OF PRESENT ILLNESS:  Rhonda Morrow is a 60 y.o. year old 60 or [place] American female patient seen here as a referral on 07/05/2021 . She underwent testing in sept 2021 , AHI  14.3/h. REM AHI of 47/h.  and was titrated to CPAP in October 2021. She had Covid 19 last week, tested negative this morning.   And is here for compliance visit still having Insomnia.  Feels her mind is awake and aroused, she has trouble to initiate and sustain sleep.   There has been less leg- cramping with Requip.   DIAGNOSIS : Obstructive Sleep Apnea responded well to 7 cm water CPAP. The patient was fitted with a Respironics Dreamwear FFM small mask. Overall sleep efficiency remained low.  PLANS/RECOMMENDATIONS: auto CPAP with a setting from 5-9 cm water with 1 cm EPR. Any apnea patient should avoid sedatives, hypnotics, and alcohol consumption before bedtime. Consider underlying anxiety as cause for the frequent spontaneous arousals and refer to cog     Chief concern according to patient : pt here  with sign language interpretor, states that she has been having horrible cramps that happen at night time. she doesn't have during the day.cramps and creepy  crawly sensations wake her up from sleep. she has to get up walk around rub legs. Started on rRequip.  She has felt tired , more and more- her husband is also deaf and there is no witness to snoring(?)>  Last SS > 4 yrs which was negative for OSA at [facility]-  No AHI, no PLMD, and no hypoxemia noted. No graph for sleep PSG was available.    2021.  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  She  has a past medical history of Arthritis, Asthma, Complication of anesthesia, Deaf, Diabetes mellitus without complication ([place]), GERD (gastroesophageal reflux disease), Hypertension, Left groin pain, and Thyroid disease.  Dr Posey Pronto has worked this patient up for hyperreflexia. DM2.  Left hip surgery- hip replacement.  Sleep relevant medical history: Nocturia 3 times, urge incontinence. RLS -  Hypersomnia, obesity.    Family medical /sleep history: there is no other family member on CPAP with OSA, insomnia, sleep walkers.    Social history:  Patient is working full time- post office on [employer],  and lives in a household with her deaf spouse-her mother and son (38).   The patient currently works/ used to work in shifts( night/ rotating,) 6.30 - 7  Pets are  not present. Tobacco use- never.  ETOH use rare ,  Caffeine intake in form of Soda( at work ). Regular exercise in form of walking.    Hobbies   Sleep habits are as follows: The patient's dinner time is between 6-7 PM, mother cooks for her  The patient goes to bed at 9-10 PM and continues to sleep for 3 hours, wakes for 2-3 bathroom breaks, the first time at 2 AM.   The preferred sleep position is supine or side, with the support of  pillows. Dreams are reportedly frequent/vivid .   5 AM is the usual rise time. The patient wakes up with an alarm.  She reports not feeling refreshed or restored in AM, with symptoms such as dry mouth , morning headaches , and  residual fatigue. Sometimes feeling dizzy.  Naps are taken infrequently, and she naps for 1-3 hours -no power naps.    3.5.2018 and 6.4.2018 seen by Dr. Posey Pronto"   Her headaches have significantly improved since having her left hip surgery and she feels that this may have been improved because she is on medical leave from work to recover from her hip surgery. 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 I ordered MRI cervical spine to look for compressive myelopathy at her last visit, but I did not receive these results and patient confirms that she had this performed in [facility]. I requested my staff to call imaging centers in [facility], but none confirmed that she had MRI cervical spine.  She does not have any new neurological concerns".     Review of Systems: Out of a complete 14 system review, the patient complains of only the following symptoms, and all other reviewed systems are negative.:  Fatigue, sleepiness , snoring, fragmented sleep, nocturia, leg cramping.   Wheezing. Leg cramping.  Had just COVID - and has been taking a break from CPAP 7 cm water.        How likely are you to doze in the following situations: 0 = not likely, 1 = slight chance, 2 = moderate chance, 3 = high chance   Sitting and Reading? Watching Television? Sitting inactive in a public place (theater or meeting)? As a passenger in a car for an hour without a break? Lying down in the afternoon when circumstances permit? Sitting and talking to someone? Sitting quietly after lunch without alcohol? In a car, while stopped for a few minutes in traffic?   Total = 14/ 24 points   FSS endorsed at N?A / 63 points.   Social History   Socioeconomic History   Marital status: Married    Spouse name: Not on file   Number of children: Not on file   Years of education: Not on file    Highest education level: Not on file  Occupational History   Occupation: disabled  Tobacco Use   Smoking status: Never   Smokeless tobacco: Never  Vaping Use   Vaping Use: Never used  Substance and Sexual Activity   Alcohol use: No    Alcohol/week: 0.0 standard drinks   Drug use: No   Sexual activity: Never  Other Topics Concern   Not on file  Social History Narrative   Lives with family.  Has 3 children.  Works for [employer].  Education: high school.   Social Determinants of Radio broadcast assistant  Strain: Not on file  Food Insecurity: Not on file  Transportation Needs: Not on file  Physical Activity: Not on file  Stress: Not on file  Social Connections: Not on file    Family History  Problem Relation Age of Onset   Diabetes Mother    Heart disease Mother    Diabetes Father     Past Medical History:  Diagnosis Date   Arthritis    Asthma    Complication of anesthesia    one time woke up and was vey scared,17 yrs ago   Deaf    Diabetes mellitus without complication ([place])    GERD (gastroesophageal reflux disease)    Hypertension    Left groin pain    Thyroid disease     Past Surgical History:  Procedure Laterality Date   CARDIAC CATHETERIZATION     CESAREAN SECTION     CHOLECYSTECTOMY N/A 06/20/2016   Procedure: LAPAROSCOPIC CHOLECYSTECTOMY;  Surgeon: Rolm Bookbinder, MD;  Location: [facility];  Service: General;  Laterality: N/A;   ESOPHAGEAL MANOMETRY N/A 09/29/2013   Procedure: ESOPHAGEAL MANOMETRY (EM);  Surgeon: Milus Banister, MD;  Location: [facility];  Service: Endoscopy;  Laterality: N/A;   TOTAL HIP ARTHROPLASTY Left 02/21/2017   Procedure: LEFT TOTAL HIP ARTHROPLASTY ANTERIOR APPROACH;  Surgeon: Leandrew Koyanagi, MD;  Location: [facility];  Service: Orthopedics;  Laterality: Left;     Current Outpatient Medications on File Prior to Visit  Medication Sig Dispense Refill   albuterol (VENTOLIN HFA) 108 (90 Base) MCG/ACT inhaler Inhale 1-2 puffs into the  lungs every 6 (six) hours as needed for wheezing or shortness of breath. 8 g 6   allopurinol (ZYLOPRIM) 100 MG tablet TAKE 1 TABLET BY MOUTH EVERY DAY 90 tablet 3   amLODipine (NORVASC) 5 MG tablet Take 1 tablet (5 mg total) by mouth daily. 90 tablet 1   aspirin EC 325 MG tablet Take 1 tablet (325 mg total) by mouth 2 (two) times daily. 84 tablet 0   azelastine (ASTELIN) 0.1 % nasal spray Place 2 sprays into both nostrils 2 (two) times daily. Use in each nostril as directed 30 mL 3   blood glucose meter kit and supplies KIT Dispense based on patient and insurance preference. Use up to four times daily as directed. 1 each 0   blood glucose meter kit and supplies KIT Dispense based on patient and insurance preference. Use up to four times daily as directed. (FOR ICD-9 250.00, 250.01). 1 each 0   Calcium Carbonate-Vit D-Min (CALCIUM 1200) 1200-1000 MG-UNIT CHEW 1 po qd 30 tablet 11   cetirizine (ZYRTEC) 10 MG tablet TAKE 1 TABLET BY MOUTH EVERY DAY 90 tablet 1   colchicine 0.6 MG tablet Take 1 tablet (0.6 mg total) by mouth daily. 90 tablet 1   Cyanocobalamin (B-12) 1000 MCG SUBL Dissolove 1 tablet under tongue once a day 90 tablet 1   diclofenac (VOLTAREN) 75 MG EC tablet Take 1 tablet (75 mg total) by mouth 2 (two) times daily as needed. 60 tablet 3   diclofenac sodium (VOLTAREN) 1 % GEL Apply 4 g topically 4 (four) times daily as needed. 500 g 6   escitalopram (LEXAPRO) 10 MG tablet TAKE 1 TABLET BY MOUTH EVERYDAY AT BEDTIME 90 tablet 1   famotidine (PEPCID) 20 MG tablet TAKE 1 TABLET BY MOUTH EVERY DAY *NOT COVERED 30 tablet 0   Fiber CHEW Chew 1 tablet by mouth daily.     fluticasone (FLONASE) 50 MCG/ACT nasal spray  SPRAY 2 SPRAYS INTO EACH NOSTRIL EVERY DAY 48 mL 1   fluticasone (FLONASE) 50 MCG/ACT nasal spray Place 2 sprays into both nostrils daily. 16 g 6   hydrochlorothiazide (HYDRODIURIL) 12.5 MG tablet Take 1 tablet (12.5 mg total) by mouth daily. 90 tablet 1   hydrOXYzine  (ATARAX/VISTARIL) 10 MG tablet 1-2 tab po qhs prn 180 tablet 1   ipratropium-albuterol (DUONEB) 0.5-2.5 (3) MG/3ML SOLN Take 3 mLs by nebulization every 6 (six) hours as needed. 360 mL 3   Lancets (ONETOUCH ULTRASOFT) lancets Use as instructed 100 each 12   meclizine (ANTIVERT) 12.5 MG tablet Take 1 tablet (12.5 mg total) by mouth 3 (three) times daily as needed for dizziness. 30 tablet 0   meloxicam (MOBIC) 7.5 MG tablet 1-2 po qd prn 60 tablet 1   metaxalone (SKELAXIN) 800 MG tablet Take 1 tablet (800 mg total) by mouth 3 (three) times daily. 30 tablet 2   metFORMIN (GLUCOPHAGE-XR) 500 MG 24 hr tablet TAKE 2 TABLET BY MOUTH EVERY DAY WITH BREAKFAST 180 tablet 0   montelukast (SINGULAIR) 10 MG tablet Take 1 tablet (10 mg total) by mouth at bedtime. 90 tablet 3   Multiple Vitamin (MULTIVITAMIN) tablet Take 1 tablet by mouth daily.     NONFORMULARY OR COMPOUNDED ITEM Nebulizer   DX ASTHMA 1 each 0   nystatin-triamcinolone (MYCOLOG II) cream Apply twice daily to lower chest area/upper abdomen 30 g 0   ofloxacin (FLOXIN OTIC) 0.3 % OTIC solution Place 10 drops into the right ear daily. 10 mL 0   omeprazole (PRILOSEC) 20 MG capsule Take 1 capsule (20 mg total) by mouth daily. 90 capsule 3   omeprazole (PRILOSEC) 20 MG capsule Take 1 capsule (20 mg total) by mouth 2 (two) times daily before a meal. 60 capsule 3   ondansetron (ZOFRAN ODT) 4 MG disintegrating tablet Take 1 tablet (4 mg total) by mouth every 8 (eight) hours as needed for nausea or vomiting. 20 tablet 0   ONETOUCH VERIO test strip USE AS INSTRUCTED--- ONE TOUCH VERIO TEST STRIPS 100 strip 12   potassium chloride SA (KLOR-CON M20) 20 MEQ tablet Take 1 tablet (20 mEq total) by mouth daily. 90 tablet 1   promethazine-dextromethorphan (PROMETHAZINE-DM) 6.25-15 MG/5ML syrup Take 5 mLs by mouth 4 (four) times daily as needed. 118 mL 0   rOPINIRole (REQUIP) 0.25 MG tablet TAKE 1 TABLET BY MOUTH AT BEDTIME FOR 4 DAYS THEN INCREASE TO 2 TABLETS AT  BEDTIME IF NEEDED 180 tablet 1   rosuvastatin (CRESTOR) 10 MG tablet TAKE 1 TABLET BY MOUTH EVERY DAY 90 tablet 1   Spacer/Aero-Holding Chambers (AEROCHAMBER MV) inhaler Use as instructed 1 each 0   SYMBICORT 160-4.5 MCG/ACT inhaler Inhale 2 puffs into the lungs every 12 (twelve) hours. 1 each 8   traMADol (ULTRAM) 50 MG tablet TAKE 1 TABLET (50 MG TOTAL) BY MOUTH EVERY 8 (EIGHT) HOURS AS NEEDED FOR UP TO 5 DAYS. 16 tablet 0   triamcinolone cream (KENALOG) 0.1 % Apply bid to hands 30 g 0   Current Facility-Administered Medications on File Prior to Visit  Medication Dose Route Frequency Provider Last Rate Last Admin   alum & mag hydroxide-simeth (MAALOX/MYLANTA) 200-200-20 MG/5ML suspension 30 mL  30 mL Oral Once Saguier, Edward, PA-C       hyoscyamine (LEVSIN SL) SL tablet 0.125 mg  0.125 mg Sublingual Once Saguier, Edward, PA-C       lidocaine (XYLOCAINE) 2 % viscous mouth solution 15 mL  15  mL Mouth/Throat Once Saguier, Edward, PA-C       nitroGLYCERIN (NITRODUR - Dosed in mg/24 hr) patch 0.2 mg  0.2 mg Transdermal Daily Wallene Huh, DPM        Allergies  Allergen Reactions   Losartan Shortness Of Breath   Oxycodone-Acetaminophen Nausea And Vomiting   Augmentin [Amoxicillin-Pot Clavulanate] Diarrhea    Physical exam:  Today's Vitals   07/05/21 1329  BP: 134/78  Pulse: 70  Weight: 230 lb (104.3 kg)  Height: 5' 4"  (1.626 m)   Body mass index is 39.48 kg/m.   Wt Readings from Last 3 Encounters:  07/05/21 230 lb (104.3 kg)  06/29/21 231 lb (104.8 kg)  06/28/21 232 lb 6.4 oz (105.4 kg)     Ht Readings from Last 3 Encounters:  07/05/21 5' 4"  (1.626 m)  06/29/21 5' 4"  (1.626 m)  06/28/21 5' 4"  (1.626 m)      General: The patient is awake, alert and appears not in acute distress. The patient is well groomed. Head: Normocephalic, atraumatic. Neck is supple. Mallampati 3 plus,  neck circumference: 17 inches . Nasal airflow  patent.  Retrognathia is  seen.    Cardiovascular:  Regular rate and cardiac rhythm by pulse,  without distended neck veins. Respiratory: Lungs are clear to auscultation.  Skin:  Without evidence of ankle edema, she has shin rash . Wears a wig.  Trunk: The patient's posture is erect.   Neurologic exam : The patient is awake and alert, oriented to place and time.   Memory subjective described as intact.  Attention span & concentration ability appears normal.  Speech is fluent,  without  dysarthria, dysphonia or aphasia.  Mood and affect are appropriate.   Cranial nerves: no loss of smell or taste reported  Pupils are equal and briskly reactive to light. Funduscopic exam deferred.   Extraocular movements in vertical and horizontal planes were intact and without nystagmus. No Diplopia. Visual fields by finger perimetry are intact. 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   Facial sensation intact to fine touch.  Facial motor strength is symmetric and tongue and uvula move midline.  Neck ROM : rotation, tilt and flexion extension were normal for age and shoulder shrug was symmetrical.    Motor exam:  Symmetric bulk, tone and ROM.   Normal tone without cog wheeling, symmetric grip strength .   Sensory:  Fine touch, pinprick and vibration were tested  and  normal.  Proprioception tested in the upper extremities was normal.   Coordination: Rapid alternating movements in the fingers/hands were of normal speed.  The Finger-to-nose maneuver was intact without evidence of ataxia, dysmetria or tremor.   Gait and station: Patient could rise unassisted from a seated position, walked without assistive device.  Stance is of normal width/ base and the patient turned with 3 steps.  Toe and heel walk were deferred.  Deep tendon reflexes: in the  upper and lower extremities are symmetric and intact.  Upper reflexes are brisk-  Babinski response was downgoing.       After spending a total time of 25 minutes face to face and  additional time for physical and neurologic examination, review of laboratory studies,  Time for the interpreter.  And personal review of imaging studies, reports and results of other testing and review of referral information / records as far as provided in visit, I have established the following assessments:  1)  Frequent nocturia and sleep interruption from leg cramps - waking  her up rather than making it difficult to stay asleep. Ropinorol has been initiated for RLS- making it easier to go to sleep. I wi,l refil.   2) Insomnia , treatment with trazodone/ desyrel - can be combined with melatonin.    3)   Mild , but REM dependent OSA on CPAP- resume therapy. She has not used it in 30 days, received the machine only 04-27-2021.     4) exposure to sun,light in daytime, postviral illness. Recent COVID August 2022.     I would like to thank Carollee Herter, Alferd Apa, DO and [place], [address],  [place] [address] for allowing me to meet with and to take care of this pleasant patient.   CC: I will share my notes with PCP.   Electronically signed by: Larey Seat, MD 07/05/2021 2:00 PM  [facility] and [facility] Sleep Board certified by The [employer] of Sleep Medicine and Diplomate of the [employer] of Sleep Medicine. Board certified In Neurology through the [employer], Fellow of the [employer] of Neurology. Medical Director of [employer].

## 2021-07-05 NOTE — Patient Instructions (Signed)
Trazodone Tablets What is this medication? TRAZODONE (TRAZ oh done) treats depression. It increases the amount ofserotonin in the brain, a hormone that helps regulate mood. This medicine may be used for other purposes; ask your health care provider orpharmacist if you have questions. COMMON BRAND NAME(S): Desyrel What should I tell my care team before I take this medication? They need to know if you have any of these conditions: Attempted suicide or thinking about it Bipolar disorder Bleeding problems Glaucoma Heart disease, or previous heart attack Irregular heart beat Kidney or liver disease Low levels of sodium in the blood An unusual or allergic reaction to trazodone, other medications, foods, dyes or preservatives Pregnant or trying to get pregnant Breast-feeding How should I use this medication? Take this medication by mouth with a glass of water. Follow the directions on the prescription label. Take this medication shortly after a meal or a light snack. Take your medication at regular intervals. Do not take your medication more often than directed. Do not stop taking this medication suddenly except upon the advice of your care team. Stopping this medication too quickly maycause serious side effects or your condition may worsen. A special MedGuide will be given to you by the pharmacist with eachprescription and refill. Be sure to read this information carefully each time. Talk to your care team regarding the use of this medication in children.Special care may be needed. Overdosage: If you think you have taken too much of this medicine contact apoison control center or emergency room at once. NOTE: This medicine is only for you. Do not share this medicine with others. What if I miss a dose? If you miss a dose, take it as soon as you can. If it is almost time for yournext dose, take only that dose. Do not take double or extra doses. What may interact with this medication? Do not take  this medication with any of the following: Certain medications for fungal infections like fluconazole, itraconazole, ketoconazole, posaconazole, voriconazole Cisapride Dronedarone Linezolid MAOIs like Carbex, Eldepryl, Marplan, Nardil, and Parnate Mesoridazine Methylene blue (injected into a vein) Pimozide Saquinavir Thioridazine This medication may also interact with the following: Alcohol Antiviral medications for HIV or AIDS Aspirin and aspirin-like medications Barbiturates like phenobarbital Certain medications for blood pressure, heart disease, irregular heart beat Certain medications for depression, anxiety, or psychotic disturbances Certain medications for migraine headache like almotriptan, eletriptan, frovatriptan, naratriptan, rizatriptan, sumatriptan, zolmitriptan Certain medications for seizures like carbamazepine and phenytoin Certain medications for sleep Certain medications that treat or prevent blood clots like dalteparin, enoxaparin, warfarin Digoxin Fentanyl Lithium NSAIDS, medications for pain and inflammation, like ibuprofen or naproxen Other medications that prolong the QT interval (cause an abnormal heart rhythm) like dofetilide Rasagiline Supplements like St. John's wort, kava kava, valerian Tramadol Tryptophan This list may not describe all possible interactions. Give your health care provider a list of all the medicines, herbs, non-prescription drugs, or dietary supplements you use. Also tell them if you smoke, drink alcohol, or use illegaldrugs. Some items may interact with your medicine. What should I watch for while using this medication? Tell your care team if your symptoms do not get better or if they get worse. Visit your care team for regular checks on your progress. Because it may take several weeks to see the full effects of this medication, it is important tocontinue your treatment as prescribed by your care team. Watch for new or worsening  thoughts of suicide or depression. This includes sudden changes in mood,  behaviors, or thoughts. These changes can happen at any time but are more common in the beginning of treatment or after a change in dose. Call your care team right away if you experience these thoughts orworsening depression. Manic episodes may happen in patients with bipolar disorder who take this medication. Watch for changes in feelings or behaviors such as feeling anxious, nervous, agitated, panicky, irritable, hostile, aggressive, impulsive, severely restless, overly excited and hyperactive, or trouble sleeping. These changes can happen at any time but are more common in the beginning of treatment or after a change in dose. Call your care team right away if you notice any ofthese symptoms. You may get drowsy or dizzy. Do not drive, use machinery, or do anything that needs mental alertness until you know how this medication affects you. Do not stand or sit up quickly, especially if you are an older patient. This reduces the risk of dizzy or fainting spells. Alcohol may interfere with the effect ofthis medication. Avoid alcoholic drinks. This medication may cause dry eyes and blurred vision. If you wear contact lenses you may feel some discomfort. Lubricating drops may help. See your eyedoctor if the problem does not go away or is severe. Your mouth may get dry. Chewing sugarless gum, sucking hard candy and drinking plenty of water may help. Contact your care team if the problem does not goaway or is severe. What side effects may I notice from receiving this medication? Side effects that you should report to your care team as soon as possible: Allergic reactions-skin rash, itching, hives, swelling of the face, lips, tongue, or throat Bleeding-bloody or black, tar-like stools, red or dark [place] urine, vomiting blood or Roback material that looks like coffee grounds, small, red or purple spots on skin, unusual bleeding or  bruising Heart rhythm changes-fast or irregular heartbeat, dizziness, feeling faint or lightheaded, chest pain, trouble breathing Low blood pressure-dizziness, feeling faint or lightheaded, blurry vision Low sodium level-muscle weakness, fatigue, dizziness, headache, confusion Prolonged or painful erection Serotonin syndrome-irritability, confusion, fast or irregular heartbeat, muscle stiffness, twitching muscles, sweating, high fever, seizures, chills, vomiting, diarrhea Sudden eye pain or change in vision such as blurry vision, seeing halos around lights, vision loss Thoughts of suicide or self-harm, worsening mood, feelings of depression Side effects that usually do not require medical attention (report to your careteam if they continue or are bothersome): Change in sex drive or performance Constipation Dizziness Drowsiness Dry mouth This list may not describe all possible side effects. Call your doctor for medical advice about side effects. You may report side effects to FDA at1-800-FDA-1088. Where should I keep my medication? Keep out of the reach of children and pets. Store at room temperature between 15 and 30 degrees C (59 to 86 degrees F). Protect from light. Keep container tightly closed. Throw away any unusedmedication after the expiration date. NOTE: This sheet is a summary. It may not cover all possible information. If you have questions about this medicine, talk to your doctor, pharmacist, orhealth care provider.  2022 Elsevier/Gold Standard (2020-09-13 14:46:11)

## 2021-07-18 ENCOUNTER — Encounter: Payer: Self-pay | Admitting: Family Medicine

## 2021-07-18 ENCOUNTER — Other Ambulatory Visit: Payer: Self-pay

## 2021-07-18 ENCOUNTER — Ambulatory Visit (INDEPENDENT_AMBULATORY_CARE_PROVIDER_SITE_OTHER): Payer: Federal, State, Local not specified - PPO | Admitting: Family Medicine

## 2021-07-18 VITALS — BP 124/86 | HR 84 | Temp 98.1°F | Resp 18 | Ht 64.0 in | Wt 232.2 lb

## 2021-07-18 DIAGNOSIS — U071 COVID-19: Secondary | ICD-10-CM

## 2021-07-18 DIAGNOSIS — Z23 Encounter for immunization: Secondary | ICD-10-CM | POA: Diagnosis not present

## 2021-07-18 NOTE — Assessment & Plan Note (Signed)
Symptoms almost completely resolved Ok to c/o mucinex / delsym for cough and con't flonase / astelin F/u prn

## 2021-07-18 NOTE — Patient Instructions (Signed)
Upper Respiratory Infection, Adult An upper respiratory infection (URI) is a common viral infection of the nose, throat, and upper air passages that lead to the lungs. The most common type of URI is the common cold. URIs usually get better on their own, without medical treatment. What are the causes? A URI is caused by a virus. You may catch a virus by: Breathing in droplets from an infected person's cough or sneeze. Touching something that has been exposed to the virus (contaminated) and then touching your mouth, nose, or eyes. What increases the risk? You are more likely to get a URI if: You are very young or very old. It is autumn or winter. You have close contact with others, such as at a daycare, school, or health care facility. You smoke. You have long-term (chronic) heart or lung disease. You have a weakened disease-fighting (immune) system. You have nasal allergies or asthma. You are experiencing a lot of stress. You work in an area that has poor air circulation. You have poor nutrition. What are the signs or symptoms? A URI usually involves some of the following symptoms: Runny or stuffy (congested) nose. Sneezing. Cough. Sore throat. Headache. Fatigue. Fever. Loss of appetite. Pain in your forehead, behind your eyes, and over your cheekbones (sinus pain). Muscle aches. Redness or irritation of the eyes. Pressure in the ears or face. How is this diagnosed? This condition may be diagnosed based on your medical history and symptoms, and a physical exam. Your health care provider may use a cotton swab to take a mucus sample from your nose (nasal swab). This sample can be tested to determine what virus is causing the illness. How is this treated? URIs usually get better on their own within 7-10 days. You can take steps at home to relieve your symptoms. Medicines cannot cure URIs, but your health care provider may recommend certain medicines to help relieve symptoms, such  as: Over-the-counter cold medicines. Cough suppressants. Coughing is a type of defense against infection that helps to clear the respiratory system, so take these medicines only as recommended by your health care provider. Fever-reducing medicines. Follow these instructions at home: Activity Rest as needed. If you have a fever, stay home from work or school until your fever is gone or until your health care provider says you are no longer contagious. Your health care provider may have you wear a face mask to prevent your infection from spreading. Relieving symptoms Gargle with a salt-water mixture 3-4 times a day or as needed. To make a salt-water mixture, completely dissolve -1 tsp of salt in 1 cup of warm water. Use a cool-mist humidifier to add moisture to the air. This can help you breathe more easily. Eating and drinking  Drink enough fluid to keep your urine pale yellow. Eat soups and other clear broths. General instructions  Take over-the-counter and prescription medicines only as told by your health care provider. These include cold medicines, fever reducers, and cough suppressants. Do not use any products that contain nicotine or tobacco, such as cigarettes and e-cigarettes. If you need help quitting, ask your health care provider. Stay away from secondhand smoke. Stay up to date on all immunizations, including the yearly (annual) flu vaccine. Keep all follow-up visits as told by your health care provider. This is important. How to prevent the spread of infection to others  URIs can be passed from person to person (are contagious). To prevent the infection from spreading: Wash your hands often with soap and   water. If soap and water are not available, use hand sanitizer. Avoid touching your mouth, face, eyes, or nose. Cough or sneeze into a tissue or your sleeve or elbow instead of into your hand or into the air. Contact a health care provider if: You are getting worse instead  of better. You have a fever or chills. Your mucus is Wyse or red. You have yellow or [place] discharge coming from your nose. You have pain in your face, especially when you bend forward. You have swollen neck glands. You have pain while swallowing. You have white areas in the back of your throat. Get help right away if: You have shortness of breath that gets worse. You have severe or persistent: Headache. Ear pain. Sinus pain. Chest pain. You have chronic lung disease along with any of the following: Wheezing. Prolonged cough. Coughing up blood. A change in your usual mucus. You have a stiff neck. You have changes in your: Vision. Hearing. Thinking. Mood. Summary An upper respiratory infection (URI) is a common infection of the nose, throat, and upper air passages that lead to the lungs. A URI is caused by a virus. URIs usually get better on their own within 7-10 days. Medicines cannot cure URIs, but your health care provider may recommend certain medicines to help relieve symptoms. This information is not intended to replace advice given to you by your health care provider. Make sure you discuss any questions you have with your health care provider. Document Revised: 07/01/2020 Document Reviewed: 07/01/2020 Elsevier Patient Education  2022 Elsevier Inc.  

## 2021-07-18 NOTE — Progress Notes (Signed)
Established Patient Office Visit  Subjective:  Patient ID: Rhonda Morrow, female    DOB: 1961/10/25  Age: 60 y.o. MRN: [id number]  CC:  Chief Complaint  Patient presents with   COVID follow up    Pt states feeling better. Pt states still having some sneezing and runny nose, no cough    HPI Rhonda Morrow presents for f/u covid--- she was dx on 8/24---  symptoms started 8/23    she was seen in the er and put on molnupiravir.       Past Surgical History:  Procedure Laterality Date   CARDIAC CATHETERIZATION     CESAREAN SECTION     CHOLECYSTECTOMY N/A 06/20/2016   Procedure: LAPAROSCOPIC CHOLECYSTECTOMY;  Surgeon: Rolm Bookbinder, MD;  Location: [place];  Service: General;  Laterality: N/A;   ESOPHAGEAL MANOMETRY N/A 09/29/2013   Procedure: ESOPHAGEAL MANOMETRY (EM);  Surgeon: Milus Banister, MD;  Location: [facility];  Service: Endoscopy;  Laterality: N/A;   TOTAL HIP ARTHROPLASTY Left 02/21/2017   Procedure: LEFT TOTAL HIP ARTHROPLASTY ANTERIOR APPROACH;  Surgeon: Leandrew Koyanagi, MD;  Location: [facility];  Service: Orthopedics;  Laterality: Left;    Family History  Problem Relation Age of Onset   Diabetes Mother    Heart disease Mother    Diabetes Father     Social History   Socioeconomic History   Marital status: Married    Spouse name: Not on file   Number of children: Not on file   Years of education: Not on file   Highest education level: Not on file  Occupational History   Occupation: disabled  Tobacco Use   Smoking status: Never   Smokeless tobacco: Never  Vaping Use   Vaping Use: Never used  Substance and Sexual Activity   Alcohol use: No    Alcohol/week: 0.0 standard drinks   Drug use: No   Sexual activity: Never  Other Topics Concern   Not on file  Social History Narrative   Lives with family.  Has 3 children.  Works for [employer].  Education: high school.   Social Determinants of Health   Financial Resource Strain: Not on file  Food Insecurity: Not  on file  Transportation Needs: Not on file  Physical Activity: Not on file  Stress: Not on file  Social Connections: Not on file  Intimate Partner Violence: Not on file    Outpatient Medications Prior to Visit  Medication Sig Dispense Refill   albuterol (VENTOLIN HFA) 108 (90 Base) MCG/ACT inhaler Inhale 1-2 puffs into the lungs every 6 (six) hours as needed for wheezing or shortness of breath. 8 g 6   allopurinol (ZYLOPRIM) 100 MG tablet TAKE 1 TABLET BY MOUTH EVERY DAY 90 tablet 3   amLODipine (NORVASC) 5 MG tablet Take 1 tablet (5 mg total) by mouth daily. 90 tablet 1   aspirin EC 325 MG tablet Take 1 tablet (325 mg total) by mouth 2 (two) times daily. 84 tablet 0   azelastine (ASTELIN) 0.1 % nasal spray Place 2 sprays into both nostrils 2 (two) times daily. Use in each nostril as directed 30 mL 3   blood glucose meter kit and supplies KIT Dispense based on patient and insurance preference. Use up to four times daily as directed. 1 each 0   blood glucose meter kit and supplies KIT Dispense based on patient and insurance preference. Use up to four times daily as directed. (FOR ICD-9 250.00, 250.01). 1 each 0  Calcium Carbonate-Vit D-Min (CALCIUM 1200) 1200-1000 MG-UNIT CHEW 1 po qd 30 tablet 11   cetirizine (ZYRTEC) 10 MG tablet TAKE 1 TABLET BY MOUTH EVERY DAY 90 tablet 1   colchicine 0.6 MG tablet Take 1 tablet (0.6 mg total) by mouth daily. 90 tablet 1   Cyanocobalamin (B-12) 1000 MCG SUBL Dissolove 1 tablet under tongue once a day 90 tablet 1   diclofenac (VOLTAREN) 75 MG EC tablet Take 1 tablet (75 mg total) by mouth 2 (two) times daily as needed. 60 tablet 3   diclofenac sodium (VOLTAREN) 1 % GEL Apply 4 g topically 4 (four) times daily as needed. 500 g 6   famotidine (PEPCID) 20 MG tablet TAKE 1 TABLET BY MOUTH EVERY DAY *NOT COVERED 30 tablet 0   Fiber CHEW Chew 1 tablet by mouth daily.     fluticasone (FLONASE) 50 MCG/ACT nasal spray SPRAY 2 SPRAYS INTO EACH NOSTRIL EVERY DAY 48  mL 1   fluticasone (FLONASE) 50 MCG/ACT nasal spray Place 2 sprays into both nostrils daily. 16 g 6   hydrochlorothiazide (HYDRODIURIL) 12.5 MG tablet Take 1 tablet (12.5 mg total) by mouth daily. 90 tablet 1   hydrOXYzine (ATARAX/VISTARIL) 10 MG tablet 1-2 tab po qhs prn 180 tablet 1   ipratropium-albuterol (DUONEB) 0.5-2.5 (3) MG/3ML SOLN Take 3 mLs by nebulization every 6 (six) hours as needed. 360 mL 3   Lancets (ONETOUCH ULTRASOFT) lancets Use as instructed 100 each 12   meclizine (ANTIVERT) 12.5 MG tablet Take 1 tablet (12.5 mg total) by mouth 3 (three) times daily as needed for dizziness. 30 tablet 0   meloxicam (MOBIC) 7.5 MG tablet 1-2 po qd prn 60 tablet 1   metaxalone (SKELAXIN) 800 MG tablet Take 1 tablet (800 mg total) by mouth 3 (three) times daily. 30 tablet 2   metFORMIN (GLUCOPHAGE-XR) 500 MG 24 hr tablet TAKE 2 TABLET BY MOUTH EVERY DAY WITH BREAKFAST 180 tablet 0   montelukast (SINGULAIR) 10 MG tablet Take 1 tablet (10 mg total) by mouth at bedtime. 90 tablet 3   Multiple Vitamin (MULTIVITAMIN) tablet Take 1 tablet by mouth daily.     NONFORMULARY OR COMPOUNDED ITEM Nebulizer   DX ASTHMA 1 each 0   nystatin-triamcinolone (MYCOLOG II) cream Apply twice daily to lower chest area/upper abdomen 30 g 0   ofloxacin (FLOXIN OTIC) 0.3 % OTIC solution Place 10 drops into the right ear daily. 10 mL 0   omeprazole (PRILOSEC) 20 MG capsule Take 1 capsule (20 mg total) by mouth daily. 90 capsule 3   omeprazole (PRILOSEC) 20 MG capsule Take 1 capsule (20 mg total) by mouth 2 (two) times daily before a meal. 60 capsule 3   ondansetron (ZOFRAN ODT) 4 MG disintegrating tablet Take 1 tablet (4 mg total) by mouth every 8 (eight) hours as needed for nausea or vomiting. 20 tablet 0   ONETOUCH VERIO test strip USE AS INSTRUCTED--- ONE TOUCH VERIO TEST STRIPS 100 strip 12   potassium chloride SA (KLOR-CON M20) 20 MEQ tablet Take 1 tablet (20 mEq total) by mouth daily. 90 tablet 1    promethazine-dextromethorphan (PROMETHAZINE-DM) 6.25-15 MG/5ML syrup Take 5 mLs by mouth 4 (four) times daily as needed. 118 mL 0   rOPINIRole (REQUIP) 0.25 MG tablet TAKE 1 TABLET BY MOUTH AT BEDTIME FOR 4 DAYS THEN INCREASE TO 2 TABLETS AT BEDTIME IF NEEDED 180 tablet 1   rosuvastatin (CRESTOR) 10 MG tablet TAKE 1 TABLET BY MOUTH EVERY DAY 90 tablet 1  Spacer/Aero-Holding Chambers (AEROCHAMBER MV) inhaler Use as instructed 1 each 0   SYMBICORT 160-4.5 MCG/ACT inhaler Inhale 2 puffs into the lungs every 12 (twelve) hours. 1 each 8   traZODone (DESYREL) 100 MG tablet Take 1 tablet (100 mg total) by mouth at bedtime. 90 tablet 1   triamcinolone cream (KENALOG) 0.1 % Apply bid to hands 30 g 0   Facility-Administered Medications Prior to Visit  Medication Dose Route Frequency Provider Last Rate Last Admin   alum & mag hydroxide-simeth (MAALOX/MYLANTA) 200-200-20 MG/5ML suspension 30 mL  30 mL Oral Once Saguier, Edward, PA-C       hyoscyamine (LEVSIN SL) SL tablet 0.125 mg  0.125 mg Sublingual Once Saguier, Edward, PA-C       lidocaine (XYLOCAINE) 2 % viscous mouth solution 15 mL  15 mL Mouth/Throat Once Saguier, Edward, PA-C       nitroGLYCERIN (NITRODUR - Dosed in mg/24 hr) patch 0.2 mg  0.2 mg Transdermal Daily Regal, Tamala Fothergill, DPM        Allergies  Allergen Reactions   Losartan Shortness Of Breath   Oxycodone-Acetaminophen Nausea And Vomiting   Augmentin [Amoxicillin-Pot Clavulanate] Diarrhea    ROS Review of Systems  Constitutional:  Negative for appetite change, chills, diaphoresis, fatigue, fever and unexpected weight change.  HENT:  Positive for postnasal drip, rhinorrhea and sneezing. Negative for congestion, sinus pressure, sinus pain and sore throat.   Eyes:  Negative for pain, redness and visual disturbance.  Respiratory:  Negative for cough, chest tightness, shortness of breath and wheezing.   Cardiovascular:  Negative for chest pain, palpitations and leg swelling.  Endocrine:  Negative for cold intolerance, heat intolerance, polydipsia, polyphagia and polyuria.  Genitourinary:  Negative for difficulty urinating, dysuria and frequency.  Neurological:  Negative for dizziness, light-headedness, numbness and headaches.     Objective:    Physical Exam Vitals and nursing note reviewed.  Constitutional:      Appearance: She is well-developed.  HENT:     Head: Normocephalic and atraumatic.  Eyes:     Conjunctiva/sclera: Conjunctivae normal.  Neck:     Thyroid: No thyromegaly.     Vascular: No carotid bruit or JVD.  Cardiovascular:     Rate and Rhythm: Normal rate and regular rhythm.     Heart sounds: Normal heart sounds. No murmur heard. Pulmonary:     Effort: Pulmonary effort is normal. No respiratory distress.     Breath sounds: Normal breath sounds. No wheezing or rales.  Chest:     Chest wall: No tenderness.  Musculoskeletal:     Cervical back: Normal range of motion and neck supple.  Neurological:     Mental Status: She is alert and oriented to person, place, and time.    BP 124/86 (BP Location: Right Arm, Patient Position: Sitting, Cuff Size: Large)   Pulse 84   Temp 98.1 F (36.7 C) (Oral)   Resp 18   Ht _0  (1.626 m)   Wt 232 lb 3.2 oz (105.3 kg)   LMP 05/20/2012   SpO2 98%   BMI 39.86 kg/m  Wt Readings from Last 3 Encounters:  07/18/21 232 lb 3.2 oz (105.3 kg)  07/05/21 230 lb (104.3 kg)  06/29/21 231 lb (104.8 kg)     Health Maintenance Due  Topic Date Due   OPHTHALMOLOGY EXAM  10/17/2017   COVID-19 Vaccine (4 - Booster for Pfizer series) 11/15/2020   Pneumococcal Vaccine 7-67 Years old (3 - PCV) 07/22/2021    There are  no preventive care reminders to display for this patient.  Lab Results  Component Value Date   TSH 0.74 04/20/2020   Lab Results  Component Value Date   WBC 4.8 02/22/2021   HGB 13.9 02/22/2021   HCT 42.5 02/22/2021   MCV 84.1 02/22/2021   PLT 138.0 (L) 02/22/2021   Lab Results  Component Value  Date   NA 141 04/05/2021   K 3.4 (L) 04/05/2021   CO2 30 04/05/2021   GLUCOSE 168 (H) 04/05/2021   BUN 13 04/05/2021   CREATININE 0.81 04/05/2021   BILITOT 0.3 04/05/2021   ALKPHOS 80 04/05/2021   AST 15 04/05/2021   ALT 18 04/05/2021   PROT 6.3 04/05/2021   ALBUMIN 3.7 04/05/2021   CALCIUM 9.5 04/05/2021   ANIONGAP 13 05/24/2020   GFR 80.50 04/05/2021   Lab Results  Component Value Date   CHOL 145 04/05/2021   Lab Results  Component Value Date   HDL 54.60 04/05/2021   Lab Results  Component Value Date   LDLCALC 71 04/05/2021   Lab Results  Component Value Date   TRIG 96.0 04/05/2021   Lab Results  Component Value Date   CHOLHDL 3 04/05/2021   Lab Results  Component Value Date   HGBA1C 7.2 (H) 04/05/2021      Assessment & Plan:   Problem List Items Addressed This Visit       Unprioritized   COVID-19 - Primary    Symptoms almost completely resolved Ok to c/o mucinex / delsym for cough and con't flonase / astelin F/u prn       Other Visit Diagnoses     Need for influenza vaccination       Relevant Orders   Flu Vaccine QUAD 49moIM (Fluarix, Fluzone & Alfiuria Quad PF) (Completed)       No orders of the defined types were placed in this encounter.   Follow-up: No follow-ups on file.    YAnn Held DO

## 2021-07-25 ENCOUNTER — Other Ambulatory Visit: Payer: Self-pay | Admitting: Family Medicine

## 2021-07-25 DIAGNOSIS — M79641 Pain in right hand: Secondary | ICD-10-CM

## 2021-07-27 ENCOUNTER — Encounter: Payer: Self-pay | Admitting: Neurology

## 2021-07-27 ENCOUNTER — Telehealth: Payer: Self-pay | Admitting: Neurology

## 2021-07-27 NOTE — Telephone Encounter (Signed)
Pt has called to report her extreme dissatisfaction with [facility].  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  Pt has had difficulty with her CPAP and would very much like be be referred to another DME, please call pt.

## 2021-07-27 NOTE — Telephone Encounter (Signed)
Attempted to call the patient back through the video call system. LVM asking the pt to call back. I will also send a mychart message to get more information.

## 2021-07-29 ENCOUNTER — Ambulatory Visit: Payer: Federal, State, Local not specified - PPO | Admitting: Family Medicine

## 2021-08-02 ENCOUNTER — Encounter: Payer: Self-pay | Admitting: Family Medicine

## 2021-08-02 ENCOUNTER — Ambulatory Visit (INDEPENDENT_AMBULATORY_CARE_PROVIDER_SITE_OTHER): Payer: Federal, State, Local not specified - PPO | Admitting: Family Medicine

## 2021-08-02 ENCOUNTER — Other Ambulatory Visit: Payer: Self-pay

## 2021-08-02 VITALS — BP 104/78 | HR 81 | Ht 64.0 in | Wt 229.2 lb

## 2021-08-02 DIAGNOSIS — M7662 Achilles tendinitis, left leg: Secondary | ICD-10-CM | POA: Diagnosis not present

## 2021-08-02 DIAGNOSIS — M79641 Pain in right hand: Secondary | ICD-10-CM

## 2021-08-02 NOTE — Patient Instructions (Signed)
Thank you for coming in today.   Return in 3 months.   Return with ASL interpreter.   Return sooner if needed.   Let me know if you need anything through mychart.

## 2021-08-02 NOTE — Progress Notes (Signed)
   I, Wendy Poet, LAT, ATC, am serving as scribe for Dr. Lynne Leader.  Rhonda Morrow is a 60 y.o. female who presents to [place] at [facility] today for f/u R hand, bilat shoulder, and L posterior calcaneous pain. 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 Pt was last seen by Dr. [name] on 06/21/21 and was advised to cont meloxicam, Voltaren gel, HEP, and hand padding. Pt was also taught eccentric exercises to treat her Achilles tendinitis. Today, pt reports that her R hand pain is better but the L heel is unchanged.  She has been taking the Meloxicam and has been doing her HEP consistently.  When asked to show me her heel exercises she demonstrated that she has been doing them sitting without much weightbearing at all.  Dx imaging: 06/21/21 R hand XR 01/24/21 R & L shoulder XR 10/15/20 L foot XR 11/08/18 R shoulder XR  Pertinent review of systems: No fevers or chills  Relevant historical information: Diabetes.  Deaf   Exam:  BP 104/78 (BP Location: Right Arm, Patient Position: Sitting, Cuff Size: Large)   Pulse 81   Ht 5\' 4"  (1.626 m)   Wt 229 lb 3.2 oz (104 kg)   LMP 05/20/2012   SpO2 96%   BMI 39.34 kg/m  General: Well Developed, well nourished, and in no acute distress.   MSK: Right hand normal-appearing nontender normal hand motion.  Left Achilles tendon and heel normal-appearing normal motion.  Some pain with resisted foot plantarflexion.      Assessment and Plan: 60 y.o. female with Achilles tendinitis left.  Not much improved.  However upon review of home exercise program patient has been doing them somewhat incorrectly.  We taught home exercise program today with emphasis on the weightbearing component.  I think with proper exercise she is going to improve.  Recheck in 3 months or check back sooner if needed.  She agrees with this plan.  Hand pain has improved quite a bit with limited meloxicam.  She is only taking it  intermittently.   Discussed warning signs or symptoms. Please see discharge instructions. Patient expresses understanding.   The above documentation has been reviewed and is accurate and complete Lynne Leader, M.D.   Visit today conducted using a sign language interpreter.

## 2021-08-12 ENCOUNTER — Ambulatory Visit: Payer: Federal, State, Local not specified - PPO | Admitting: Family Medicine

## 2021-08-15 NOTE — Progress Notes (Signed)
I, Rhonda Morrow, LAT, ATC, am serving as scribe for Dr. Lynne Leader.  Rhonda Morrow is a 60 y.o. female who presents to [place] at [facility] today for hip pain.  She was last seen on 08/02/21 for f/u of L heel and R hand pain.  She was re-shown her HEP focusing on Alfredson's exercises for her L Achille's.  Today, pt reports L hip pain x approximately one week w/ no known MOI.  She has a hx of a L THR about 6 years ago.  Low back pain: Yes Radiating pain: No Aggravating factors: transitioning from sitting-to-standing; climbing stairs Treatments tried: Tylenol; Meloxicam  Diagnostic testing: CT abdomen and pelvis- 05/24/20; B hip and L-spine XR- 05/14/19  Pertinent review of systems: No fevers or chills  Relevant historical information: History of shoulder pain and Achilles tendinitis.  History left total hip replacement.   Exam:  BP 120/70 (BP Location: Right Arm, Patient Position: Sitting, Cuff Size: Large)   Pulse 84   Ht 5\' 4"  (1.626 m)   Wt 231 lb (104.8 kg)   LMP 05/20/2012   SpO2 95%   BMI 39.65 kg/m  General: Well Developed, well nourished, and in no acute distress.   MSK: Left hip normal-appearing Normal motion. Tender palpation greater trochanter.  Hip abduction strength diminished 4/5 with pain. External rotation strength diminished 4+/5.  Right hip normal-appearing Decreased range of motion pain with internal rotation and flexion. Hip abduction strength diminished 4/5.  External rotation strength diminished 4+/5.    Lab and Radiology Results  X-ray images bilateral hips obtained today personally and independently interpreted. X-ray images compared to hip images visible on CT scan abdomen and pelvis July 2021  Right hip: Severe hip DJD.  No acute fractures.  Left hip: No acute fractures.  Intact appearing surgical hardware left THR.  No apparent hardware loosening per my read.  Await formal radiology review    Assessment and  Plan: 60 y.o. 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  Plan to treat with home exercise program focused primarily on hip abduction strengthening and stretching.  Recheck in 1 month.  Certainly could proceed with the injection if needed at that time or sooner.  Would like to avoid steroid injections if possible given her diabetes history.  Right hip pain multifactorial.  Some pain thought to be due to the hip DJD however the majority the pain due to hip abductor tendinopathy and trochanteric bursitis.  Begin home exercise program taught in clinic today by ATC primary treatment will be strengthening and stretching.  Again recheck in a month and then consider injection or formal PT.  Of note patient would like to avoid formal physical therapy due to cost and inconvenience if possible.  Today's visit conducted using an American sign language interpreter.  [id number]; 15 additional minutes spent for Therapeutic exercises as stated in above notes.  This included exercises focusing on stretching, strengthening, with significant focus on eccentric aspects.   Long term goals include an improvement in range of motion, strength, endurance as well as avoiding reinjury. Patient's frequency would include in 1-2 times a day, 3-5 times a week for a duration of 6-12 weeks.  Proper technique shown and discussed handout in great detail with ATC.  All questions were discussed and answered.     PDMP not reviewed this encounter. Orders Placed This Encounter  Procedures   DG HIPS BILAT WITH PELVIS  3-4 VIEWS    Standing Status:   Future    Standing Expiration Date:   08/16/2022    Order Specific Question:   Reason for Exam (SYMPTOM  OR DIAGNOSIS REQUIRED)    Answer:   Eval BL hip pain    Order Specific Question:   Is patient pregnant?    Answer:   No    Order Specific Question:   Preferred imaging location?    Answer:   [facility]    No orders of the defined types were placed in this encounter.    Discussed warning signs or symptoms. Please see discharge instructions. Patient expresses understanding.   The above documentation has been reviewed and is accurate and complete Lynne Leader, M.D.

## 2021-08-16 ENCOUNTER — Encounter: Payer: Self-pay | Admitting: Family Medicine

## 2021-08-16 ENCOUNTER — Other Ambulatory Visit: Payer: Self-pay

## 2021-08-16 ENCOUNTER — Ambulatory Visit (INDEPENDENT_AMBULATORY_CARE_PROVIDER_SITE_OTHER): Payer: Federal, State, Local not specified - PPO | Admitting: Family Medicine

## 2021-08-16 ENCOUNTER — Ambulatory Visit (INDEPENDENT_AMBULATORY_CARE_PROVIDER_SITE_OTHER): Payer: Federal, State, Local not specified - PPO

## 2021-08-16 VITALS — BP 120/70 | HR 84 | Ht 64.0 in | Wt 231.0 lb

## 2021-08-16 DIAGNOSIS — M25551 Pain in right hip: Secondary | ICD-10-CM | POA: Diagnosis not present

## 2021-08-16 DIAGNOSIS — M25552 Pain in left hip: Secondary | ICD-10-CM

## 2021-08-16 NOTE — Patient Instructions (Signed)
Good to see you today.  Please get an Xray today before you leave.  Please perform the exercise program that we have prepared for you and gone over in detail on a daily basis.  In addition to the handout you were provided you can access your program through: [email or web address]   Your unique program code is:   [email or web address]  Follow-up in one month or I can see you back sooner if you want an injection.

## 2021-08-18 NOTE — Progress Notes (Signed)
Bilateral hip x-ray. Left hip shows normal-appearing total hip replacement. Right hip x-ray looks normal to radiology.   X-ray is reassuring.

## 2021-08-29 ENCOUNTER — Other Ambulatory Visit (HOSPITAL_BASED_OUTPATIENT_CLINIC_OR_DEPARTMENT_OTHER): Payer: Self-pay | Admitting: Family Medicine

## 2021-08-29 DIAGNOSIS — Z1231 Encounter for screening mammogram for malignant neoplasm of breast: Secondary | ICD-10-CM

## 2021-09-06 DIAGNOSIS — N760 Acute vaginitis: Secondary | ICD-10-CM | POA: Diagnosis not present

## 2021-09-13 ENCOUNTER — Other Ambulatory Visit: Payer: Self-pay

## 2021-09-13 ENCOUNTER — Ambulatory Visit (INDEPENDENT_AMBULATORY_CARE_PROVIDER_SITE_OTHER): Payer: Federal, State, Local not specified - PPO | Admitting: Family Medicine

## 2021-09-13 VITALS — BP 128/82 | HR 73 | Ht 64.0 in | Wt 226.0 lb

## 2021-09-13 DIAGNOSIS — M25552 Pain in left hip: Secondary | ICD-10-CM | POA: Diagnosis not present

## 2021-09-13 DIAGNOSIS — M25551 Pain in right hip: Secondary | ICD-10-CM

## 2021-09-13 NOTE — Patient Instructions (Addendum)
Thank you for coming in today.   Focus on the home exercises.   Recheck in 1 month.   Schedule with ASL interpreter.  30 min visit.

## 2021-09-13 NOTE — Progress Notes (Signed)
   I, Peterson Lombard, LAT, ATC acting as a scribe for Lynne Leader, MD.  Rhonda Morrow is a 60 y.o. female who presents to [place] at [facility] today for f/u bilat hip pain. Pt was last seen by Dr. [name] on 08/16/21 and was taught a HEP. Today, pt reports the meloxicam is helping and the pain is slightly improved. Pt has not very compliant w/ HEP but has been trying to walk.  Dx imaging: 08/16/21 Bilat hips  05/14/19 Bilat hips & L-spine XR  Pertinent review of systems: No fevers or chills  Relevant historical information: Diabetes.  Hypertension   Exam:  BP 128/82   Pulse 73   Ht 5\' 4"  (1.626 m)   Wt 226 lb (102.5 kg)   LMP 05/20/2012   SpO2 98%   BMI 38.79 kg/m  General: Well Developed, well nourished, and in no acute distress.   MSK: Mild antalgic gait    Lab and Radiology Results EXAM: DG HIP (WITH OR WITHOUT PELVIS) 3-4V BILAT   COMPARISON:  None.   FINDINGS: Left total hip arthroplasty has been performed. No acute fracture or dislocation. Sacroiliac and right hip joint spaces are preserved. Soft tissues are unremarkable.   IMPRESSION: No acute abnormality.     Electronically Signed   By: Fidela Salisbury M.D.   On: 08/17/2021 23:52   I, Lynne Leader, personally (independently) visualized and performed the interpretation of the images attached in this note.    Assessment and Plan: 60 y.o. female with bilateral lateral hip pain thought to be due to hip abductor tendinopathy/greater trochanteric bursitis.  Fundamental treatment for this issue is hip abductor strengthening and strengthening.  Patient was taught home exercises in clinic at the last visit by ATC.  She is improving with very minimal home exercises.  We discussed that with a little more home exercise I think that she is going to get better.  Plan to really focus on trying at least once a day to do the home exercises and recheck in about a month.  If not better at that point would  consider injections.  With the patient and myself would like to avoid injections if possible as it will increase her blood sugar with diabetes and she is worried that it will hurt as well.  Recheck in a month. American sign language interpreter used for today's visit.     Discussed warning signs or symptoms. Please see discharge instructions. Patient expresses understanding.   The above documentation has been reviewed and is accurate and complete Lynne Leader, M.D. Total encounter time 20 minutes including face-to-face time with the patient and, reviewing past medical record, and charting on the date of service.   Treatment plan and options

## 2021-09-14 ENCOUNTER — Other Ambulatory Visit: Payer: Self-pay | Admitting: Family Medicine

## 2021-09-14 DIAGNOSIS — E1151 Type 2 diabetes mellitus with diabetic peripheral angiopathy without gangrene: Secondary | ICD-10-CM

## 2021-09-14 DIAGNOSIS — I10 Essential (primary) hypertension: Secondary | ICD-10-CM

## 2021-09-23 ENCOUNTER — Other Ambulatory Visit: Payer: Self-pay | Admitting: Family Medicine

## 2021-09-23 DIAGNOSIS — R1013 Epigastric pain: Secondary | ICD-10-CM

## 2021-10-01 ENCOUNTER — Other Ambulatory Visit: Payer: Self-pay | Admitting: Neurology

## 2021-10-01 DIAGNOSIS — J301 Allergic rhinitis due to pollen: Secondary | ICD-10-CM

## 2021-10-10 ENCOUNTER — Other Ambulatory Visit: Payer: Self-pay

## 2021-10-10 ENCOUNTER — Ambulatory Visit (HOSPITAL_BASED_OUTPATIENT_CLINIC_OR_DEPARTMENT_OTHER)
Admission: RE | Admit: 2021-10-10 | Discharge: 2021-10-10 | Disposition: A | Discharge status: Home / Self Care | Payer: Federal, State, Local not specified - PPO | Source: Ambulatory Visit | Attending: Family Medicine | Admitting: Family Medicine

## 2021-10-10 ENCOUNTER — Encounter (HOSPITAL_BASED_OUTPATIENT_CLINIC_OR_DEPARTMENT_OTHER): Payer: Self-pay

## 2021-10-10 DIAGNOSIS — Z1231 Encounter for screening mammogram for malignant neoplasm of breast: Secondary | ICD-10-CM | POA: Insufficient documentation

## 2021-10-10 NOTE — Progress Notes (Deleted)
PATIENT: Rhonda Morrow DOB: Jun 17, 1961  REASON FOR VISIT: follow up HISTORY FROM: patient  No chief complaint on file.    HISTORY OF PRESENT ILLNESS:  10/10/21 ALL:  Rhonda Morrow is a 60 y.o. female here today for follow up for OSA on CPAP. She was last seen by Dr Brett Fairy 06/2021. She was having difficult with compliance. She received machine 04/27/2021 but had not use din last 30 days when seen in office. She called 07/27/2021 requesting to switch to a new DME (currently [facility]) as she did not feel she was being treated fairly due to being deaf. She is currently under rental period and required to return machine.   She continues ropinirole 0.26m 2 tablets at bedtime for RLS and trazodone 1038mQHS for insomnia.   Azelastine nasal spray refilled by Dr DoBrett Fairy1/28/2022???  HISTORY: (copied from Dr Dohmeier's previous note)  Rhonda Morrow a [age].o. year old Bl45r [place] female patient seen here as a referral on 07/05/2021 . She underwent testing in sept 2021 , AHI  14.3/h. REM AHI of 47/h.  and was titrated to CPAP in October 2021. She had Covid 19 last week, tested negative this morning.    And is here for compliance visit still having Insomnia.  Feels her mind is awake and aroused, she has trouble to initiate and sustain sleep.   There has been less leg- cramping with Requip.   REVIEW OF SYSTEMS: Out of a complete 14 system review of symptoms, the patient complains only of the following symptoms, insomnia, restless legs, deaf and all other reviewed systems are negative.  ESS:  ALLERGIES: Allergies  Allergen Reactions   Losartan Shortness Of Breath   Oxycodone-Acetaminophen Nausea And Vomiting   Augmentin [Amoxicillin-Pot Clavulanate] Diarrhea    HOME MEDICATIONS: Outpatient Medications Prior to Visit  Medication Sig Dispense Refill   albuterol (VENTOLIN HFA) 108 (90 Base) MCG/ACT inhaler Inhale 1-2 puffs into the lungs every 6 (six) hours as needed  for wheezing or shortness of breath. 8 g 6   allopurinol (ZYLOPRIM) 100 MG tablet TAKE 1 TABLET BY MOUTH EVERY DAY 90 tablet 3   amLODipine (NORVASC) 5 MG tablet TAKE 1 TABLET (5 MG TOTAL) BY MOUTH DAILY. 90 tablet 1   aspirin EC 325 MG tablet Take 1 tablet (325 mg total) by mouth 2 (two) times daily. 84 tablet 0   Azelastine HCl 137 MCG/SPRAY SOLN PLACE 2 SPRAYS INTO BOTH NOSTRILS 2 (TWO) TIMES DAILY. USE IN EACH NOSTRIL AS DIRECTED 30 mL 0   blood glucose meter kit and supplies KIT Dispense based on patient and insurance preference. Use up to four times daily as directed. 1 each 0   blood glucose meter kit and supplies KIT Dispense based on patient and insurance preference. Use up to four times daily as directed. (FOR ICD-9 250.00, 250.01). 1 each 0   Calcium Carbonate-Vit D-Min (CALCIUM 1200) 1200-1000 MG-UNIT CHEW 1 po qd 30 tablet 11   cetirizine (ZYRTEC) 10 MG tablet TAKE 1 TABLET BY MOUTH EVERY DAY 90 tablet 1   colchicine 0.6 MG tablet Take 1 tablet (0.6 mg total) by mouth daily. 90 tablet 1   Cyanocobalamin (B-12) 1000 MCG SUBL Dissolove 1 tablet under tongue once a day 90 tablet 1   diclofenac (VOLTAREN) 75 MG EC tablet Take 1 tablet (75 mg total) by mouth 2 (two) times daily as needed. 60 tablet 3   diclofenac sodium (VOLTAREN) 1 % GEL Apply  4 g topically 4 (four) times daily as needed. 500 g 6   famotidine (PEPCID) 20 MG tablet TAKE 1 TABLET BY MOUTH EVERY DAY *NOT COVERED 30 tablet 0   Fiber CHEW Chew 1 tablet by mouth daily.     fluticasone (FLONASE) 50 MCG/ACT nasal spray SPRAY 2 SPRAYS INTO EACH NOSTRIL EVERY DAY 48 mL 1   fluticasone (FLONASE) 50 MCG/ACT nasal spray Place 2 sprays into both nostrils daily. 16 g 6   hydrochlorothiazide (HYDRODIURIL) 12.5 MG tablet Take 1 tablet (12.5 mg total) by mouth daily. 90 tablet 1   hydrOXYzine (ATARAX/VISTARIL) 10 MG tablet 1-2 tab po qhs prn 180 tablet 1   ipratropium-albuterol (DUONEB) 0.5-2.5 (3) MG/3ML SOLN Take 3 mLs by nebulization  every 6 (six) hours as needed. 360 mL 3   Lancets (ONETOUCH ULTRASOFT) lancets Use as instructed 100 each 12   meclizine (ANTIVERT) 12.5 MG tablet Take 1 tablet (12.5 mg total) by mouth 3 (three) times daily as needed for dizziness. 30 tablet 0   meloxicam (MOBIC) 7.5 MG tablet TAKE 1-2 TABLETS BY MOUTH DAILY AS NEEDED 60 tablet 1   metaxalone (SKELAXIN) 800 MG tablet Take 1 tablet (800 mg total) by mouth 3 (three) times daily. 30 tablet 2   metFORMIN (GLUCOPHAGE-XR) 500 MG 24 hr tablet TAKE 2 TABLET BY MOUTH EVERY DAY WITH BREAKFAST 180 tablet 1   montelukast (SINGULAIR) 10 MG tablet Take 1 tablet (10 mg total) by mouth at bedtime. 90 tablet 3   Multiple Vitamin (MULTIVITAMIN) tablet Take 1 tablet by mouth daily.     NONFORMULARY OR COMPOUNDED ITEM Nebulizer   DX ASTHMA 1 each 0   nystatin-triamcinolone (MYCOLOG II) cream Apply twice daily to lower chest area/upper abdomen 30 g 0   ofloxacin (FLOXIN OTIC) 0.3 % OTIC solution Place 10 drops into the right ear daily. 10 mL 0   omeprazole (PRILOSEC) 20 MG capsule Take 1 capsule (20 mg total) by mouth daily. 90 capsule 3   omeprazole (PRILOSEC) 20 MG capsule TAKE 1 CAPSULE (20 MG TOTAL) BY MOUTH 2 (TWO) TIMES DAILY BEFORE A MEAL. 180 capsule 1   ondansetron (ZOFRAN ODT) 4 MG disintegrating tablet Take 1 tablet (4 mg total) by mouth every 8 (eight) hours as needed for nausea or vomiting. 20 tablet 0   ONETOUCH VERIO test strip USE AS INSTRUCTED--- ONE TOUCH VERIO TEST STRIPS 100 strip 12   potassium chloride SA (KLOR-CON M20) 20 MEQ tablet Take 1 tablet (20 mEq total) by mouth daily. 90 tablet 1   promethazine-dextromethorphan (PROMETHAZINE-DM) 6.25-15 MG/5ML syrup Take 5 mLs by mouth 4 (four) times daily as needed. 118 mL 0   rOPINIRole (REQUIP) 0.25 MG tablet TAKE 1 TABLET BY MOUTH AT BEDTIME FOR 4 DAYS THEN INCREASE TO 2 TABLETS AT BEDTIME IF NEEDED 180 tablet 1   rosuvastatin (CRESTOR) 10 MG tablet TAKE 1 TABLET BY MOUTH EVERY DAY 90 tablet 1    Spacer/Aero-Holding Chambers (AEROCHAMBER MV) inhaler Use as instructed 1 each 0   SYMBICORT 160-4.5 MCG/ACT inhaler Inhale 2 puffs into the lungs every 12 (twelve) hours. 1 each 8   traZODone (DESYREL) 100 MG tablet Take 1 tablet (100 mg total) by mouth at bedtime. 90 tablet 1   triamcinolone cream (KENALOG) 0.1 % Apply bid to hands 30 g 0   Facility-Administered Medications Prior to Visit  Medication Dose Route Frequency Provider Last Rate Last Admin   alum & mag hydroxide-simeth (MAALOX/MYLANTA) 200-200-20 MG/5ML suspension 30 mL  30 mL  Oral Once Saguier, Percell Miller, PA-C       hyoscyamine (LEVSIN SL) SL tablet 0.125 mg  0.125 mg Sublingual Once Saguier, Edward, PA-C       lidocaine (XYLOCAINE) 2 % viscous mouth solution 15 mL  15 mL Mouth/Throat Once Saguier, Edward, PA-C       nitroGLYCERIN (NITRODUR - Dosed in mg/24 hr) patch 0.2 mg  0.2 mg Transdermal Daily Wallene Huh, DPM        PAST MEDICAL HISTORY: Past Medical History:  Diagnosis Date   Arthritis    Asthma    Complication of anesthesia    one time woke up and was vey scared,17 yrs ago   Deaf    Diabetes mellitus without complication ([place])    GERD (gastroesophageal reflux disease)    Hypertension    Left groin pain    Thyroid disease     PAST SURGICAL HISTORY: Past Surgical History:  Procedure Laterality Date   CARDIAC CATHETERIZATION     CESAREAN SECTION     CHOLECYSTECTOMY N/A 06/20/2016   Procedure: LAPAROSCOPIC CHOLECYSTECTOMY;  Surgeon: Rolm Bookbinder, MD;  Location: [place];  Service: General;  Laterality: N/A;   ESOPHAGEAL MANOMETRY N/A 09/29/2013   Procedure: ESOPHAGEAL MANOMETRY (EM);  Surgeon: Milus Banister, MD;  Location: [facility];  Service: Endoscopy;  Laterality: N/A;   TOTAL HIP ARTHROPLASTY Left 02/21/2017   Procedure: LEFT TOTAL HIP ARTHROPLASTY ANTERIOR APPROACH;  Surgeon: Leandrew Koyanagi, MD;  Location: [place];  Service: Orthopedics;  Laterality: Left;    FAMILY HISTORY: Family History  Problem  Relation Age of Onset   Diabetes Mother    Heart disease Mother    Diabetes Father     SOCIAL HISTORY: Social History   Socioeconomic History   Marital status: Married    Spouse name: Not on file   Number of children: Not on file   Years of education: Not on file   Highest education level: Not on file  Occupational History   Occupation: disabled  Tobacco Use   Smoking status: Never   Smokeless tobacco: Never  Vaping Use   Vaping Use: Never used  Substance and Sexual Activity   Alcohol use: No    Alcohol/week: 0.0 standard drinks   Drug use: No   Sexual activity: Never  Other Topics Concern   Not on file  Social History Narrative   Lives with family.  Has 3 children.  Works for [employer].  Education: high school.   Social Determinants of Health   Financial Resource Strain: Not on file  Food Insecurity: Not on file  Transportation Needs: Not on file  Physical Activity: Not on file  Stress: Not on file  Social Connections: Not on file  Intimate Partner Violence: Not on file     PHYSICAL EXAM  There were no vitals filed for this visit. There is no height or weight on file to calculate BMI.  Generalized: Well developed, in no acute distress  Cardiology: normal rate and rhythm, no murmur noted Respiratory: clear to auscultation bilaterally  Neurological examination  Mentation: Alert oriented to time, place, history taking. Follows all commands speech and language fluent Cranial nerve II-XII: Pupils were equal round reactive to light. Extraocular movements were full, visual field were full  Motor: The motor testing reveals 5 over 5 strength of all 4 extremities. Good symmetric motor tone is noted throughout.  Gait and station: Gait is normal.    DIAGNOSTIC DATA (LABS, IMAGING, TESTING) - I reviewed patient  records, labs, notes, testing and imaging myself where available.  No flowsheet data found.   Lab Results  Component Value Date   WBC 4.8 02/22/2021   HGB  13.9 02/22/2021   HCT 42.5 02/22/2021   MCV 84.1 02/22/2021   PLT 138.0 (L) 02/22/2021      Component Value Date/Time   NA 141 04/05/2021 0859   K 3.4 (L) 04/05/2021 0859   CL 102 04/05/2021 0859   CO2 30 04/05/2021 0859   GLUCOSE 168 (H) 04/05/2021 0859   BUN 13 04/05/2021 0859   CREATININE 0.81 04/05/2021 0859   CREATININE 0.76 07/22/2020 1359   CALCIUM 9.5 04/05/2021 0859   PROT 6.3 04/05/2021 0859   ALBUMIN 3.7 04/05/2021 0859   AST 15 04/05/2021 0859   ALT 18 04/05/2021 0859   ALKPHOS 80 04/05/2021 0859   BILITOT 0.3 04/05/2021 0859   GFRNONAA 43 (L) 05/24/2020 0540   GFRAA 49 (L) 05/24/2020 0540   Lab Results  Component Value Date   CHOL 145 04/05/2021   HDL 54.60 04/05/2021   LDLCALC 71 04/05/2021   TRIG 96.0 04/05/2021   CHOLHDL 3 04/05/2021   Lab Results  Component Value Date   HGBA1C 7.2 (H) 04/05/2021   Lab Results  Component Value Date   VITAMINB12 >1515 (H) 05/14/2019   Lab Results  Component Value Date   TSH 0.74 04/20/2020     ASSESSMENT AND PLAN 60 y.o. year old female  has a past medical history of Arthritis, Asthma, Complication of anesthesia, Deaf, Diabetes mellitus without complication ([place]), GERD (gastroesophageal reflux disease), Hypertension, Left groin pain, and Thyroid disease. here with     ICD-10-CM   1. Restless leg syndrome  G25.81     2. OSA (obstructive sleep apnea)  G47.33     3. Difficulty using continuous positive airway pressure (CPAP) device  Z78.9         Rhonda Morrow is doing well on CPAP therapy. Compliance report reveals ***. *** was encouraged to continue using CPAP nightly and for greater than 4 hours each night. We will update supply orders as indicated. Risks of untreated sleep apnea review and education materials provided. Healthy lifestyle habits encouraged. *** will follow up in ***, sooner if needed. *** verbalizes understanding and agreement with this plan.    No orders of the defined types were placed in  this encounter.    No orders of the defined types were placed in this encounter.     Debbora Presto, FNP-C 10/10/2021, 4:12 PM [facility] [address] [place], [place] [address] [phone]

## 2021-10-10 NOTE — Patient Instructions (Incomplete)

## 2021-10-11 ENCOUNTER — Ambulatory Visit: Payer: Federal, State, Local not specified - PPO | Attending: Internal Medicine

## 2021-10-11 ENCOUNTER — Ambulatory Visit: Payer: Federal, State, Local not specified - PPO

## 2021-10-11 ENCOUNTER — Encounter: Payer: Self-pay | Admitting: Family Medicine

## 2021-10-11 ENCOUNTER — Other Ambulatory Visit (HOSPITAL_BASED_OUTPATIENT_CLINIC_OR_DEPARTMENT_OTHER): Payer: Self-pay

## 2021-10-11 ENCOUNTER — Ambulatory Visit: Payer: Federal, State, Local not specified - PPO | Admitting: Family Medicine

## 2021-10-11 DIAGNOSIS — G4733 Obstructive sleep apnea (adult) (pediatric): Secondary | ICD-10-CM

## 2021-10-11 DIAGNOSIS — G2581 Restless legs syndrome: Secondary | ICD-10-CM

## 2021-10-11 DIAGNOSIS — Z789 Other specified health status: Secondary | ICD-10-CM

## 2021-10-11 DIAGNOSIS — Z23 Encounter for immunization: Secondary | ICD-10-CM

## 2021-10-11 MED ORDER — PFIZER COVID-19 VAC BIVALENT 30 MCG/0.3ML IM SUSP
INTRAMUSCULAR | 0 refills | Status: DC
  Filled 2021-10-11: qty 0.3, 1d supply, fill #0

## 2021-10-11 NOTE — Progress Notes (Signed)
   Covid-19 Vaccination Clinic  Name:  Rhonda Morrow    MRN: [id number] DOB: [birth year]  10/11/2021  [name] was observed post Covid-19 immunization for 15 minutes without incident. She was provided with Vaccine Information Sheet and instruction to access the V-Safe system.   [name] was instructed to call 911 with any severe reactions post vaccine: Difficulty breathing  Swelling of face and throat  A fast heartbeat  A bad rash all over body  Dizziness and weakness   Immunizations Administered     Name Date Dose VIS Date Route   Pfizer Covid-19 Vaccine Bivalent Booster 10/11/2021 11:11 AM 0.3 mL 07/06/2021 Intramuscular   Manufacturer: [place]   Lot: [id number]   [place]: [phone]

## 2021-10-12 NOTE — Progress Notes (Signed)
I, Peterson Lombard, LAT, ATC acting as a scribe for Lynne Leader, MD.  Rhonda Morrow is a 60 y.o. female who presents to [facility] today for f/u bilat hip pain thought to be due to hip abductor tendinopathy/greater trochanteric bursitis. Pt was last seen by Dr. [name] on 09/13/21 and was advised to really focus on trying to complete the HEP at least once a day. Today, pt reports that she wants to talk about her hips and her L post ankle and Achille's.  The meloxicam is helping but she feels like she is walking slow.  She states that she only has pain during her initial walking steps but not once she gets going.  Her husband is currently being admitted to the hospital for heart failure.  She spent all night with him in the emergency room last night.  Dx imaging: 08/16/21 Bilat hips             05/14/19 Bilat hips & L-spine XR  Pertinent review of systems: No fevers or chills  Relevant historical information: Diabetes   Exam:  BP 124/80 (BP Location: Right Arm, Patient Position: Sitting, Cuff Size: Large)   Pulse 83   Ht 5\' 4"  (1.626 m)   Wt 231 lb 3.2 oz (104.9 kg)   LMP 05/20/2012   SpO2 97%   BMI 39.69 kg/m  General: Well Developed, well nourished, and in no acute distress.   MSK: Bilateral hips normal-appearing normal motion tender palpation greater trochanter.    Lab and Radiology Results EXAM: DG HIP (WITH OR WITHOUT PELVIS) 3-4V BILAT   COMPARISON:  None.   FINDINGS: Left total hip arthroplasty has been performed. No acute fracture or dislocation. Sacroiliac and right hip joint spaces are preserved. Soft tissues are unremarkable.   IMPRESSION: No acute abnormality.     Electronically Signed   By: Fidela Salisbury M.D.   On: 08/17/2021 23:52   I, Lynne Leader, personally (independently) visualized and performed the interpretation of the images attached in this note.     Assessment and Plan: 60 y.o. female with bilateral hip pain  thought to be due to trochanteric bursitis.  Reemphasized home exercise program.  I showed her some stretching and exercises that she can do while standing at work.  Fundamentally she is struggling to find time to do the exercises at home so I think if we can find some opportunistic time for the exercises and stretching while at work she will be little more successful.  She is agreeable to this plan.  Next step would be injection which she is trying hard to avoid.  Plan to recheck back with me as needed.  She will schedule it if needed.  Additionally independent of this visit I found her husband's chart and went over what is happening with him in the hospital with her.  This was helpful as she has had some challenges communicating with the hospital given that she is deaf.  She plans on returning back to the hospital later today.    Discussed warning signs or symptoms. Please see discharge instructions. Patient expresses understanding.   The above documentation has been reviewed and is accurate and complete Lynne Leader, M.D.  Total encounter time 20 minutes including face-to-face time with the patient and, reviewing past medical record, and charting on the date of service.   Home exercise program treatment plan and options.  Time based billing today excludes time to research her husband's medical condition and  spent time talking about her husband.

## 2021-10-17 ENCOUNTER — Ambulatory Visit (INDEPENDENT_AMBULATORY_CARE_PROVIDER_SITE_OTHER): Payer: Federal, State, Local not specified - PPO | Admitting: Family Medicine

## 2021-10-17 ENCOUNTER — Encounter: Payer: Self-pay | Admitting: Family Medicine

## 2021-10-17 ENCOUNTER — Other Ambulatory Visit: Payer: Self-pay

## 2021-10-17 ENCOUNTER — Other Ambulatory Visit: Payer: Self-pay | Admitting: Neurology

## 2021-10-17 VITALS — BP 124/80 | HR 83 | Ht 64.0 in | Wt 231.2 lb

## 2021-10-17 DIAGNOSIS — M25551 Pain in right hip: Secondary | ICD-10-CM | POA: Diagnosis not present

## 2021-10-17 DIAGNOSIS — J301 Allergic rhinitis due to pollen: Secondary | ICD-10-CM

## 2021-10-17 DIAGNOSIS — M25552 Pain in left hip: Secondary | ICD-10-CM | POA: Diagnosis not present

## 2021-10-17 NOTE — Patient Instructions (Addendum)
Good to see you today.  Continue your home exercises.  Follow-up: as needed

## 2021-11-01 ENCOUNTER — Ambulatory Visit: Payer: Federal, State, Local not specified - PPO | Admitting: Family Medicine

## 2021-12-05 ENCOUNTER — Ambulatory Visit (INDEPENDENT_AMBULATORY_CARE_PROVIDER_SITE_OTHER): Payer: Federal, State, Local not specified - PPO | Admitting: Family Medicine

## 2021-12-05 ENCOUNTER — Other Ambulatory Visit (INDEPENDENT_AMBULATORY_CARE_PROVIDER_SITE_OTHER): Payer: Federal, State, Local not specified - PPO

## 2021-12-05 ENCOUNTER — Encounter: Payer: Self-pay | Admitting: Family Medicine

## 2021-12-05 VITALS — BP 140/67 | HR 78 | Temp 98.0°F | Ht 64.0 in | Wt 223.8 lb

## 2021-12-05 DIAGNOSIS — E1165 Type 2 diabetes mellitus with hyperglycemia: Secondary | ICD-10-CM | POA: Diagnosis not present

## 2021-12-05 DIAGNOSIS — R519 Headache, unspecified: Secondary | ICD-10-CM

## 2021-12-05 DIAGNOSIS — E1169 Type 2 diabetes mellitus with other specified complication: Secondary | ICD-10-CM

## 2021-12-05 DIAGNOSIS — E785 Hyperlipidemia, unspecified: Secondary | ICD-10-CM

## 2021-12-05 DIAGNOSIS — H547 Unspecified visual loss: Secondary | ICD-10-CM

## 2021-12-05 DIAGNOSIS — I10 Essential (primary) hypertension: Secondary | ICD-10-CM | POA: Diagnosis not present

## 2021-12-05 LAB — COMPREHENSIVE METABOLIC PANEL
ALT: 12 U/L (ref 0–35)
AST: 14 U/L (ref 0–37)
Albumin: 4 g/dL (ref 3.5–5.2)
Alkaline Phosphatase: 72 U/L (ref 39–117)
BUN: 13 mg/dL (ref 6–23)
CO2: 30 mEq/L (ref 19–32)
Calcium: 9.7 mg/dL (ref 8.4–10.5)
Chloride: 100 mEq/L (ref 96–112)
Creatinine, Ser: 0.89 mg/dL (ref 0.40–1.20)
GFR: 71.56 mL/min (ref >60.00)
Glucose, Bld: 112 mg/dL — ABNORMAL HIGH (ref 70–99)
Potassium: 4.1 mEq/L (ref 3.5–5.1)
Sodium: 140 mEq/L (ref 135–145)
Total Bilirubin: 0.7 mg/dL (ref 0.2–1.2)
Total Protein: 7.1 g/dL (ref 6.0–8.3)

## 2021-12-05 LAB — LIPID PANEL
Cholesterol: 176 mg/dL (ref 0–200)
HDL: 55.1 mg/dL (ref >39.00)
LDL Cholesterol: 104 mg/dL — ABNORMAL HIGH (ref 0–99)
NonHDL: 120.84
Total CHOL/HDL Ratio: 3
Triglycerides: 85 mg/dL (ref 0.0–149.0)
VLDL: 17 mg/dL (ref 0.0–40.0)

## 2021-12-05 LAB — HEMOGLOBIN A1C: Hgb A1c MFr Bld: 6.9 % — ABNORMAL HIGH (ref 4.6–6.5)

## 2021-12-05 MED ORDER — BLOOD GLUCOSE MONITOR KIT: PACK | 0 refills | Status: AC

## 2021-12-05 NOTE — Progress Notes (Signed)
Acute Office Visit  Subjective:    Patient ID: Rhonda Morrow, female    DOB: April 09, 1961, 61 y.o.   MRN: [id number]  Chief Complaint  Patient presents with   Headache   Hypertension    HPI Patient is in today for headaches and hypertension.   Patient reports she started having a headache at work last Thursday, but it was manageable. Headache continued over the next few days but was progressively worsening. By Sunday/yesterday her headache was 9/10 (mostly frontal, parietal, bilaterally) described as throbbing - she called out of work due to the pain. Reports she took her amlodipine yesterday when the headache got terrible and that seemed to help it feel better. She did check her BP around that time and it was SBP > 200 and then SBP 160 a few hours after the medicine.  Reports she typically only takes her amlodipine if she notices that her BP is high or if she has a headache - maybe 2-3 times a week on average. She has had some mild vision changes, but reports that this has been chronic and she wears glasses at baseline, but is waiting for a new referral to an eye doctor that allows interpreters. She denies any chest pain, new dyspnea, palpitations, lower extremity edema (though does happen at times if she's been on her feet for long periods - reports she is planning to get some compression socks.        Past Medical History:  Diagnosis Date   Arthritis    Asthma    Complication of anesthesia    one time woke up and was vey scared,17 yrs ago   Deaf    Diabetes mellitus without complication ([place])    GERD (gastroesophageal reflux disease)    Hypertension    Left groin pain    Thyroid disease     Past Surgical History:  Procedure Laterality Date   CARDIAC CATHETERIZATION     CESAREAN SECTION     CHOLECYSTECTOMY N/A 06/20/2016   Procedure: LAPAROSCOPIC CHOLECYSTECTOMY;  Surgeon: Rolm Bookbinder, MD;  Location: [place];  Service: General;  Laterality: N/A;   ESOPHAGEAL MANOMETRY  N/A 09/29/2013   Procedure: ESOPHAGEAL MANOMETRY (EM);  Surgeon: Milus Banister, MD;  Location: [facility];  Service: Endoscopy;  Laterality: N/A;   TOTAL HIP ARTHROPLASTY Left 02/21/2017   Procedure: LEFT TOTAL HIP ARTHROPLASTY ANTERIOR APPROACH;  Surgeon: Leandrew Koyanagi, MD;  Location: [facility];  Service: Orthopedics;  Laterality: Left;    Family History  Problem Relation Age of Onset   Diabetes Mother    Heart disease Mother    Diabetes Father     Social History   Socioeconomic History   Marital status: Married    Spouse name: Not on file   Number of children: Not on file   Years of education: Not on file   Highest education level: Not on file  Occupational History   Occupation: disabled  Tobacco Use   Smoking status: Never   Smokeless tobacco: Never  Vaping Use   Vaping Use: Never used  Substance and Sexual Activity   Alcohol use: No    Alcohol/week: 0.0 standard drinks   Drug use: No   Sexual activity: Never  Other Topics Concern   Not on file  Social History Narrative   Lives with family.  Has 3 children.  Works for [employer].  Education: high school.   Social Determinants of Health   Financial Resource Strain: Not on file  Food Insecurity: Not on file  Transportation Needs: Not on file  Physical Activity: Not on file  Stress: Not on file  Social Connections: Not on file  Intimate Partner Violence: Not on file    Outpatient Medications Prior to Visit  Medication Sig Dispense Refill   amLODipine (NORVASC) 5 MG tablet TAKE 1 TABLET (5 MG TOTAL) BY MOUTH DAILY. 90 tablet 1   allopurinol (ZYLOPRIM) 100 MG tablet TAKE 1 TABLET BY MOUTH EVERY DAY 90 tablet 3   fluticasone (FLONASE) 50 MCG/ACT nasal spray SPRAY 2 SPRAYS INTO EACH NOSTRIL EVERY DAY 48 mL 1   metFORMIN (GLUCOPHAGE-XR) 500 MG 24 hr tablet TAKE 2 TABLET BY MOUTH EVERY DAY WITH BREAKFAST 180 tablet 1   rosuvastatin (CRESTOR) 10 MG tablet TAKE 1 TABLET BY MOUTH EVERY DAY 90 tablet 1   albuterol (VENTOLIN  HFA) 108 (90 Base) MCG/ACT inhaler Inhale 1-2 puffs into the lungs every 6 (six) hours as needed for wheezing or shortness of breath. 8 g 6   aspirin EC 325 MG tablet Take 1 tablet (325 mg total) by mouth 2 (two) times daily. 84 tablet 0   Azelastine HCl 137 MCG/SPRAY SOLN PLACE 2 SPRAYS INTO BOTH NOSTRILS 2 (TWO) TIMES DAILY. USE IN EACH NOSTRIL AS DIRECTED 30 mL 0   blood glucose meter kit and supplies KIT Dispense based on patient and insurance preference. Use up to four times daily as directed. 1 each 0   blood glucose meter kit and supplies KIT Dispense based on patient and insurance preference. Use up to four times daily as directed. (FOR ICD-9 250.00, 250.01). 1 each 0   Calcium Carbonate-Vit D-Min (CALCIUM 1200) 1200-1000 MG-UNIT CHEW 1 po qd 30 tablet 11   cetirizine (ZYRTEC) 10 MG tablet TAKE 1 TABLET BY MOUTH EVERY DAY 90 tablet 1   colchicine 0.6 MG tablet Take 1 tablet (0.6 mg total) by mouth daily. 90 tablet 1   COVID-19 mRNA bivalent vaccine, Pfizer, (PFIZER COVID-19 VAC BIVALENT) injection Inject into the muscle. 0.3 mL 0   Cyanocobalamin (B-12) 1000 MCG SUBL Dissolove 1 tablet under tongue once a day 90 tablet 1   diclofenac (VOLTAREN) 75 MG EC tablet Take 1 tablet (75 mg total) by mouth 2 (two) times daily as needed. 60 tablet 3   diclofenac sodium (VOLTAREN) 1 % GEL Apply 4 g topically 4 (four) times daily as needed. 500 g 6   famotidine (PEPCID) 20 MG tablet TAKE 1 TABLET BY MOUTH EVERY DAY *NOT COVERED 30 tablet 0   Fiber CHEW Chew 1 tablet by mouth daily.     fluticasone (FLONASE) 50 MCG/ACT nasal spray Place 2 sprays into both nostrils daily. 16 g 6   hydrochlorothiazide (HYDRODIURIL) 12.5 MG tablet Take 1 tablet (12.5 mg total) by mouth daily. 90 tablet 1   hydrOXYzine (ATARAX/VISTARIL) 10 MG tablet 1-2 tab po qhs prn 180 tablet 1   ipratropium-albuterol (DUONEB) 0.5-2.5 (3) MG/3ML SOLN Take 3 mLs by nebulization every 6 (six) hours as needed. 360 mL 3   Lancets (ONETOUCH  ULTRASOFT) lancets Use as instructed 100 each 12   meclizine (ANTIVERT) 12.5 MG tablet Take 1 tablet (12.5 mg total) by mouth 3 (three) times daily as needed for dizziness. 30 tablet 0   meloxicam (MOBIC) 7.5 MG tablet TAKE 1-2 TABLETS BY MOUTH DAILY AS NEEDED 60 tablet 1   metaxalone (SKELAXIN) 800 MG tablet Take 1 tablet (800 mg total) by mouth 3 (three) times daily. 30 tablet 2   montelukast (  SINGULAIR) 10 MG tablet Take 1 tablet (10 mg total) by mouth at bedtime. 90 tablet 3   Multiple Vitamin (MULTIVITAMIN) tablet Take 1 tablet by mouth daily.     NONFORMULARY OR COMPOUNDED ITEM Nebulizer   DX ASTHMA 1 each 0   nystatin-triamcinolone (MYCOLOG II) cream Apply twice daily to lower chest area/upper abdomen 30 g 0   ofloxacin (FLOXIN OTIC) 0.3 % OTIC solution Place 10 drops into the right ear daily. 10 mL 0   omeprazole (PRILOSEC) 20 MG capsule Take 1 capsule (20 mg total) by mouth daily. 90 capsule 3   omeprazole (PRILOSEC) 20 MG capsule TAKE 1 CAPSULE (20 MG TOTAL) BY MOUTH 2 (TWO) TIMES DAILY BEFORE A MEAL. 180 capsule 1   ondansetron (ZOFRAN ODT) 4 MG disintegrating tablet Take 1 tablet (4 mg total) by mouth every 8 (eight) hours as needed for nausea or vomiting. 20 tablet 0   ONETOUCH VERIO test strip USE AS INSTRUCTED--- ONE TOUCH VERIO TEST STRIPS 100 strip 12   potassium chloride SA (KLOR-CON M20) 20 MEQ tablet Take 1 tablet (20 mEq total) by mouth daily. 90 tablet 1   promethazine-dextromethorphan (PROMETHAZINE-DM) 6.25-15 MG/5ML syrup Take 5 mLs by mouth 4 (four) times daily as needed. 118 mL 0   rOPINIRole (REQUIP) 0.25 MG tablet TAKE 1 TABLET BY MOUTH AT BEDTIME FOR 4 DAYS THEN INCREASE TO 2 TABLETS AT BEDTIME IF NEEDED 180 tablet 1   Spacer/Aero-Holding Chambers (AEROCHAMBER MV) inhaler Use as instructed 1 each 0   SYMBICORT 160-4.5 MCG/ACT inhaler Inhale 2 puffs into the lungs every 12 (twelve) hours. 1 each 8   traZODone (DESYREL) 100 MG tablet Take 1 tablet (100 mg total) by mouth  at bedtime. 90 tablet 1   triamcinolone cream (KENALOG) 0.1 % Apply bid to hands 30 g 0   Facility-Administered Medications Prior to Visit  Medication Dose Route Frequency Provider Last Rate Last Admin   alum & mag hydroxide-simeth (MAALOX/MYLANTA) 200-200-20 MG/5ML suspension 30 mL  30 mL Oral Once Saguier, Edward, PA-C       hyoscyamine (LEVSIN SL) SL tablet 0.125 mg  0.125 mg Sublingual Once Saguier, Edward, PA-C       lidocaine (XYLOCAINE) 2 % viscous mouth solution 15 mL  15 mL Mouth/Throat Once Saguier, Edward, PA-C       nitroGLYCERIN (NITRODUR - Dosed in mg/24 hr) patch 0.2 mg  0.2 mg Transdermal Daily Regal, Tamala Fothergill, DPM        Allergies  Allergen Reactions   Losartan Shortness Of Breath   Oxycodone-Acetaminophen Nausea And Vomiting   Augmentin [Amoxicillin-Pot Clavulanate] Diarrhea    Review of Systems All review of systems negative except what is listed in the HPI     Objective:    Physical Exam Constitutional:      General: She is not in acute distress.    Appearance: Normal appearance. She is obese.  HENT:     Head: Normocephalic and atraumatic.  Cardiovascular:     Rate and Rhythm: Normal rate and regular rhythm.  Pulmonary:     Effort: Pulmonary effort is normal.     Breath sounds: Normal breath sounds. No wheezing, rhonchi or rales.  Abdominal:     Palpations: Abdomen is soft.  Musculoskeletal:     Cervical back: Normal range of motion and neck supple.  Skin:    General: Skin is warm and dry.     Capillary Refill: Capillary refill takes less than 2 seconds.  Neurological:  General: No focal deficit present.     Mental Status: She is alert and oriented to person, place, and time. Mental status is at baseline.  Psychiatric:        Mood and Affect: Mood normal.        Behavior: Behavior normal.        Thought Content: Thought content normal.        Judgment: Judgment normal.   BP 140/67    Pulse 78    Temp 98 F (36.7 C)    Ht $R'5\' 4"'Dm$  (1.626 m)    Wt  223 lb 12.8 oz (101.5 kg)    LMP 05/20/2012    SpO2 100%    BMI 38.42 kg/m  Wt Readings from Last 3 Encounters:  12/05/21 223 lb 12.8 oz (101.5 kg)  10/17/21 231 lb 3.2 oz (104.9 kg)  09/13/21 226 lb (102.5 kg)    Health Maintenance Due  Topic Date Due   OPHTHALMOLOGY EXAM  10/17/2017   FOOT EXAM  07/22/2021   HEMOGLOBIN A1C  10/05/2021    There are no preventive care reminders to display for this patient.   Lab Results  Component Value Date   TSH 0.74 04/20/2020   Lab Results  Component Value Date   WBC 4.8 02/22/2021   HGB 13.9 02/22/2021   HCT 42.5 02/22/2021   MCV 84.1 02/22/2021   PLT 138.0 (L) 02/22/2021   Lab Results  Component Value Date   NA 141 04/05/2021   K 3.4 (L) 04/05/2021   CO2 30 04/05/2021   GLUCOSE 168 (H) 04/05/2021   BUN 13 04/05/2021   CREATININE 0.81 04/05/2021   BILITOT 0.3 04/05/2021   ALKPHOS 80 04/05/2021   AST 15 04/05/2021   ALT 18 04/05/2021   PROT 6.3 04/05/2021   ALBUMIN 3.7 04/05/2021   CALCIUM 9.5 04/05/2021   ANIONGAP 13 05/24/2020   GFR 80.50 04/05/2021   Lab Results  Component Value Date   CHOL 145 04/05/2021   Lab Results  Component Value Date   HDL 54.60 04/05/2021   Lab Results  Component Value Date   LDLCALC 71 04/05/2021   Lab Results  Component Value Date   TRIG 96.0 04/05/2021   Lab Results  Component Value Date   CHOLHDL 3 04/05/2021   Lab Results  Component Value Date   HGBA1C 7.2 (H) 04/05/2021       Assessment & Plan:   1. Uncontrolled type 2 diabetes mellitus with hyperglycemia ([place]) Needs a new glucometer per patient.  - blood glucose meter kit and supplies KIT; Dispense based on patient and insurance preference. Use up to four times daily as directed. (FOR ICD-9 250.00, 250.01).  Dispense: 1 each; Refill: 0  2. Impaired vision Requesting new referral.  - Ambulatory referral to Ophthalmology  3. Essential hypertension Start taking amlodipine daily and monitor BP once per day (at  least a few hours after taking medications). Discussed proper way of checking BP. Keep a log and follow-up in 2 weeks. Low sodium diet.   4. Acute nonintractable headache, unspecified headache type Likely BP related. BP management as above. Follow-up in 2 weeks, reach out sooner if not improving or if symptoms worsen. Patient aware of signs/symptoms requiring further/urgent evaluation.      Meds ordered this encounter  Medications   blood glucose meter kit and supplies KIT    Sig: Dispense based on patient and insurance preference. Use up to four times daily as directed. (FOR ICD-9 250.00, 250.01).  Dispense:  1 each    Refill:  0    Order Specific Question:   Supervising Provider    Answer:   Penni Homans A [id number]    Order Specific Question:   Number of strips    Answer:   100    Order Specific Question:   Number of lancets    Answer:   100     Follow-up in 2 weeks for BP check and headache f/u or sooner if needed.   Terrilyn Saver, NP

## 2021-12-05 NOTE — Patient Instructions (Addendum)
Omron blood pressure machine Take your amlodipine every day at the same time Start checking your blood pressure around the same time each day. Sit relaxed for 5-10 minutes, then make sure your legs are not crossed and let your arm relax at heart level.  Keep a record and let us know if staying greater than 140/80 then let us know  Otherwise follow-up in 2 weeks. I think getting your blood pressure under control will help you headaches, if not we will make adjustments.

## 2021-12-06 ENCOUNTER — Encounter: Payer: Self-pay | Admitting: Family Medicine

## 2021-12-08 ENCOUNTER — Other Ambulatory Visit: Payer: Self-pay

## 2021-12-08 MED ORDER — ROSUVASTATIN CALCIUM 20 MG PO TABS: 20.0000 mg | ORAL_TABLET | Freq: Every day | ORAL | 2 refills | Status: DC

## 2021-12-09 ENCOUNTER — Telehealth: Payer: Self-pay

## 2021-12-09 NOTE — Telephone Encounter (Signed)
Pt called. LVM to return call 

## 2021-12-09 NOTE — Telephone Encounter (Signed)
Caller Name [facility] Phone Number [phone] Call Type Message Only Information Provided Reason for Call Returning a Call from the Office Initial Comment Caller states he is returning a call from the office. Additional Comment Office hours provided. Disp. Time Disposition Final User 12/08/2021 6:02:01 PM General Information Provided Yes Sinclair Grooms Call Closed By: Sinclair Grooms Transaction Date/Time: 12/08/2021 5:59:20 PM (ET)

## 2021-12-10 ENCOUNTER — Other Ambulatory Visit: Payer: Self-pay | Admitting: Family Medicine

## 2021-12-12 ENCOUNTER — Telehealth: Payer: Self-pay | Admitting: Family Medicine

## 2021-12-12 ENCOUNTER — Other Ambulatory Visit: Payer: Self-pay

## 2021-12-12 NOTE — Telephone Encounter (Signed)
Patient states she would like to change her rosuvastatin (CRESTOR) 20 MG tablet  to 40mg s instead of 20 mgs so she can take one pill instead of two. Please advise.

## 2021-12-12 NOTE — Telephone Encounter (Signed)
Spoke with pt via interpreter and confirmed with the pt that we were only increasing her Crestor to 20 MG not 40 MG.

## 2021-12-19 ENCOUNTER — Ambulatory Visit: Payer: Federal, State, Local not specified - PPO | Admitting: Family Medicine

## 2021-12-22 ENCOUNTER — Encounter: Payer: Self-pay | Admitting: Family Medicine

## 2021-12-22 ENCOUNTER — Ambulatory Visit (INDEPENDENT_AMBULATORY_CARE_PROVIDER_SITE_OTHER): Payer: Federal, State, Local not specified - PPO | Admitting: Family Medicine

## 2021-12-22 VITALS — BP 130/90 | HR 81 | Temp 98.1°F | Resp 20 | Ht 64.0 in | Wt 225.2 lb

## 2021-12-22 DIAGNOSIS — J302 Other seasonal allergic rhinitis: Secondary | ICD-10-CM | POA: Diagnosis not present

## 2021-12-22 DIAGNOSIS — R1032 Left lower quadrant pain: Secondary | ICD-10-CM | POA: Diagnosis not present

## 2021-12-22 DIAGNOSIS — K5901 Slow transit constipation: Secondary | ICD-10-CM | POA: Insufficient documentation

## 2021-12-22 DIAGNOSIS — R531 Weakness: Secondary | ICD-10-CM | POA: Diagnosis not present

## 2021-12-22 LAB — COMPREHENSIVE METABOLIC PANEL
AG Ratio: 1.3 (calc) (ref 1.0–2.5)
ALT: 11 U/L (ref 6–29)
AST: 12 U/L (ref 10–35)
Albumin: 3.8 g/dL (ref 3.6–5.1)
Alkaline phosphatase (APISO): 65 U/L (ref 37–153)
BUN/Creatinine Ratio: 16 (calc) (ref 6–22)
BUN: 17 mg/dL (ref 7–25)
CO2: 25 mmol/L (ref 20–32)
Calcium: 9.4 mg/dL (ref 8.6–10.4)
Chloride: 102 mmol/L (ref 98–110)
Creat: 1.06 mg/dL — ABNORMAL HIGH (ref 0.50–1.03)
Globulin: 2.9 g/dL (calc) (ref 1.9–3.7)
Glucose, Bld: 193 mg/dL — ABNORMAL HIGH (ref 65–99)
Potassium: 3.4 mmol/L — ABNORMAL LOW (ref 3.5–5.3)
Sodium: 139 mmol/L (ref 135–146)
Total Bilirubin: 0.4 mg/dL (ref 0.2–1.2)
Total Protein: 6.7 g/dL (ref 6.1–8.1)

## 2021-12-22 LAB — THYROID PANEL WITH TSH
Free Thyroxine Index: 3 (ref 1.4–3.8)
T3 Uptake: 28 % (ref 22–35)
T4, Total: 10.7 ug/dL (ref 5.1–11.9)
TSH: 0.93 mIU/L (ref 0.40–4.50)

## 2021-12-22 MED ORDER — CETIRIZINE HCL 10 MG PO TABS: 10.0000 mg | ORAL_TABLET | Freq: Every day | ORAL | 11 refills | Status: DC

## 2021-12-22 NOTE — Progress Notes (Signed)
Subjective:   By signing my name below, I, Shehryar Baig, attest that this documentation has been prepared under the direction and in the presence of Ann Held, DO  12/22/2021    Patient ID: Rhonda Morrow, female    DOB: March 03, 1961, 61 y.o.   MRN: [id number]  Chief Complaint  Patient presents with   Hypertension   Follow-up    Hypertension Associated symptoms include headaches and malaise/fatigue.  Patient is in today for a office visit.  She complains of fatigue, constipation, abdominal pain for the past 2 days. Her abdominal pain worsens while sneezing. She also finds difficulties "catching her breath" due to abdominal pain and constipation. She has tried prune juice but continues having constipation. She has not tried miralax to manage her constipation.  She continues having headache but has taken medication and found her symptoms improved.  Her blood pressure is elevated during this visit. She continues taking 5 mg amlodipine daily PO. BP Readings from Last 3 Encounters:  12/22/21 130/90  12/05/21 140/67  10/17/21 124/80   Pulse Readings from Last 3 Encounters:  12/22/21 81  12/05/21 78  10/17/21 83    Past Medical History:  Diagnosis Date   Arthritis    Asthma    Complication of anesthesia    one time woke up and was vey scared,17 yrs ago   Deaf    Diabetes mellitus without complication ([place])    GERD (gastroesophageal reflux disease)    Hypertension    Left groin pain    Thyroid disease     Past Surgical History:  Procedure Laterality Date   CARDIAC CATHETERIZATION     CESAREAN SECTION     CHOLECYSTECTOMY N/A 06/20/2016   Procedure: LAPAROSCOPIC CHOLECYSTECTOMY;  Surgeon: Rolm Bookbinder, MD;  Location: [place];  Service: General;  Laterality: N/A;   ESOPHAGEAL MANOMETRY N/A 09/29/2013   Procedure: ESOPHAGEAL MANOMETRY (EM);  Surgeon: Milus Banister, MD;  Location: [facility];  Service: Endoscopy;  Laterality: N/A;   TOTAL HIP ARTHROPLASTY Left  02/21/2017   Procedure: LEFT TOTAL HIP ARTHROPLASTY ANTERIOR APPROACH;  Surgeon: Leandrew Koyanagi, MD;  Location: [place];  Service: Orthopedics;  Laterality: Left;    Family History  Problem Relation Age of Onset   Diabetes Mother    Heart disease Mother    Diabetes Father     Social History   Socioeconomic History   Marital status: Married    Spouse name: Not on file   Number of children: Not on file   Years of education: Not on file   Highest education level: Not on file  Occupational History   Occupation: disabled  Tobacco Use   Smoking status: Never   Smokeless tobacco: Never  Vaping Use   Vaping Use: Never used  Substance and Sexual Activity   Alcohol use: No    Alcohol/week: 0.0 standard drinks   Drug use: No   Sexual activity: Never  Other Topics Concern   Not on file  Social History Narrative   Lives with family.  Has 3 children.  Works for [employer].  Education: high school.   Social Determinants of Health   Financial Resource Strain: Not on file  Food Insecurity: Not on file  Transportation Needs: Not on file  Physical Activity: Not on file  Stress: Not on file  Social Connections: Not on file  Intimate Partner Violence: Not on file    Outpatient Medications Prior to Visit  Medication Sig Dispense Refill  allopurinol (ZYLOPRIM) 100 MG tablet TAKE 1 TABLET BY MOUTH EVERY DAY 90 tablet 3   amLODipine (NORVASC) 5 MG tablet TAKE 1 TABLET (5 MG TOTAL) BY MOUTH DAILY. 90 tablet 1   blood glucose meter kit and supplies KIT Dispense based on patient and insurance preference. Use up to four times daily as directed. (FOR ICD-9 250.00, 250.01). 1 each 0   fluticasone (FLONASE) 50 MCG/ACT nasal spray SPRAY 2 SPRAYS INTO EACH NOSTRIL EVERY DAY 48 mL 1   metFORMIN (GLUCOPHAGE-XR) 500 MG 24 hr tablet TAKE 2 TABLET BY MOUTH EVERY DAY WITH BREAKFAST 180 tablet 1   rosuvastatin (CRESTOR) 20 MG tablet Take 1 tablet (20 mg total) by mouth at bedtime. 30 tablet 2    Facility-Administered Medications Prior to Visit  Medication Dose Route Frequency Provider Last Rate Last Admin   alum & mag hydroxide-simeth (MAALOX/MYLANTA) 200-200-20 MG/5ML suspension 30 mL  30 mL Oral Once Saguier, Edward, PA-C       lidocaine (XYLOCAINE) 2 % viscous mouth solution 15 mL  15 mL Mouth/Throat Once Saguier, Edward, PA-C       nitroGLYCERIN (NITRODUR - Dosed in mg/24 hr) patch 0.2 mg  0.2 mg Transdermal Daily Regal, Norman S, DPM       hyoscyamine (LEVSIN SL) SL tablet 0.125 mg  0.125 mg Sublingual Once Saguier, Edward, PA-C        Allergies  Allergen Reactions   Losartan Shortness Of Breath   Oxycodone-Acetaminophen Nausea And Vomiting   Augmentin [Amoxicillin-Pot Clavulanate] Diarrhea    Review of Systems  Constitutional:  Positive for malaise/fatigue.  Gastrointestinal:  Positive for abdominal pain and constipation.  Neurological:  Positive for headaches.      Objective:    Physical Exam Constitutional:      General: She is not in acute distress.    Appearance: Normal appearance. She is not ill-appearing.  HENT:     Head: Normocephalic and atraumatic.     Right Ear: External ear normal.     Left Ear: External ear normal.  Eyes:     Extraocular Movements: Extraocular movements intact.     Pupils: Pupils are equal, round, and reactive to light.  Cardiovascular:     Rate and Rhythm: Normal rate and regular rhythm.     Heart sounds: Normal heart sounds. No murmur heard.   No gallop.  Pulmonary:     Effort: Pulmonary effort is normal. No respiratory distress.     Breath sounds: Normal breath sounds. No wheezing or rales.  Abdominal:     Palpations: Abdomen is soft.     Tenderness: There is abdominal tenderness in the epigastric area and left lower quadrant.     Comments: Mid-epigastric pain and left lower quadrant pain.   Skin:    General: Skin is warm and dry.  Neurological:     Mental Status: She is alert and oriented to person, place, and time.   Psychiatric:        Behavior: Behavior normal.        Judgment: Judgment normal.    BP 130/90 (BP Location: Left Arm, Patient Position: Sitting, Cuff Size: Normal)    Pulse 81    Temp 98.1 F (36.7 C) (Oral)    Resp 20    Ht _0  (1.626 m)    Wt 225 lb 3.2 oz (102.2 kg)    LMP 05/20/2012    SpO2 97%    BMI 38.66 kg/m  Wt Readings from Last 3 Encounters:  12/22/21  225 lb 3.2 oz (102.2 kg)  12/05/21 223 lb 12.8 oz (101.5 kg)  10/17/21 231 lb 3.2 oz (104.9 kg)    Diabetic Foot Exam - Simple   No data filed    Lab Results  Component Value Date   WBC 4.8 02/22/2021   HGB 13.9 02/22/2021   HCT 42.5 02/22/2021   PLT 138.0 (L) 02/22/2021   GLUCOSE 112 (H) 12/05/2021   CHOL 176 12/05/2021   TRIG 85.0 12/05/2021   HDL 55.10 12/05/2021   LDLCALC 104 (H) 12/05/2021   ALT 12 12/05/2021   AST 14 12/05/2021   NA 140 12/05/2021   K 4.1 12/05/2021   CL 100 12/05/2021   CREATININE 0.89 12/05/2021   BUN 13 12/05/2021   CO2 30 12/05/2021   TSH 0.74 04/20/2020   INR 1.05 02/12/2017   HGBA1C 6.9 (H) 12/05/2021   MICROALBUR 2.6 (H) 03/22/2021    Lab Results  Component Value Date   TSH 0.74 04/20/2020   Lab Results  Component Value Date   WBC 4.8 02/22/2021   HGB 13.9 02/22/2021   HCT 42.5 02/22/2021   MCV 84.1 02/22/2021   PLT 138.0 (L) 02/22/2021   Lab Results  Component Value Date   NA 140 12/05/2021   K 4.1 12/05/2021   CO2 30 12/05/2021   GLUCOSE 112 (H) 12/05/2021   BUN 13 12/05/2021   CREATININE 0.89 12/05/2021   BILITOT 0.7 12/05/2021   ALKPHOS 72 12/05/2021   AST 14 12/05/2021   ALT 12 12/05/2021   PROT 7.1 12/05/2021   ALBUMIN 4.0 12/05/2021   CALCIUM 9.7 12/05/2021   ANIONGAP 13 05/24/2020   GFR 71.56 12/05/2021   Lab Results  Component Value Date   CHOL 176 12/05/2021   Lab Results  Component Value Date   HDL 55.10 12/05/2021   Lab Results  Component Value Date   LDLCALC 104 (H) 12/05/2021   Lab Results  Component Value Date   TRIG 85.0  12/05/2021   Lab Results  Component Value Date   CHOLHDL 3 12/05/2021   Lab Results  Component Value Date   HGBA1C 6.9 (H) 12/05/2021       Assessment & Plan:   Problem List Items Addressed This Visit       Unprioritized   Left lower quadrant abdominal pain    Ct abd Check labs       Relevant Orders   Hemoglobin A1c   Amylase   Lipase   CT Abdomen Pelvis W Contrast   Slow transit constipation    Colace / miralax Drink water  Call or rto prn      Relevant Orders   CBC with Differential/Platelet   Thyroid Panel With TSH   CT Abdomen Pelvis W Contrast   Comprehensive metabolic panel   Weakness - Primary   Relevant Orders   CBC with Differential/Platelet   Thyroid Panel With TSH   Vitamin B12   Comprehensive metabolic panel   Other Visit Diagnoses     Seasonal allergies       Relevant Medications   cetirizine (ZYRTEC) 10 MG tablet        Meds ordered this encounter  Medications   cetirizine (ZYRTEC) 10 MG tablet    Sig: Take 1 tablet (10 mg total) by mouth daily.    Dispense:  30 tablet    Refill:  11    I, Ann Held, DO, personally preformed the services described in this documentation.  All medical record  entries made by the scribe were at my direction and in my presence.  I have reviewed the chart and discharge instructions (if applicable) and agree that the record reflects my personal performance and is accurate and complete. 12/22/2021   I,Shehryar Baig,acting as a scribe for Ann Held, DO.,have documented all relevant documentation on the behalf of Ann Held, DO,as directed by  Ann Held, DO while in the presence of Ann Held, DO.   Ann Held, DO

## 2021-12-22 NOTE — Assessment & Plan Note (Signed)
Ct abd Check labs

## 2021-12-22 NOTE — Assessment & Plan Note (Signed)
Colace / miralax Drink water  Call or rto prn

## 2021-12-22 NOTE — Patient Instructions (Addendum)
Get miralax and colace over the counter     Constipation, Adult Constipation is when a person has fewer than three bowel movements in a week, has difficulty having a bowel movement, or has stools (feces) that are dry, hard, or larger than normal. Constipation may be caused by an underlying condition. It may become worse with age if a person takes certain medicines and does not take in enough fluids. Follow these instructions at home: Eating and drinking  Eat foods that have a lot of fiber, such as beans, whole grains, and fresh fruits and vegetables. Limit foods that are low in fiber and high in fat and processed sugars, such as fried or sweet foods. These include french fries, hamburgers, cookies, candies, and soda. Drink enough fluid to keep your urine pale yellow. General instructions Exercise regularly or as told by your health care provider. Try to do 150 minutes of moderate exercise each week. Use the bathroom when you have the urge to go. Do not hold it in. Take over-the-counter and prescription medicines only as told by your health care provider. This includes any fiber supplements. During bowel movements: Practice deep breathing while relaxing the lower abdomen. Practice pelvic floor relaxation. Watch your condition for any changes. Let your health care provider know about them. Keep all follow-up visits as told by your health care provider. This is important. Contact a health care provider if: You have pain that gets worse. You have a fever. You do not have a bowel movement after 4 days. You vomit. You are not hungry or you lose weight. You are bleeding from the opening between the buttocks (anus). You have thin, pencil-like stools. Get help right away if: You have a fever and your symptoms suddenly get worse. You leak stool or have blood in your stool. Your abdomen is bloated. You have severe pain in your abdomen. You feel dizzy or you faint. Summary Constipation is  when a person has fewer than three bowel movements in a week, has difficulty having a bowel movement, or has stools (feces) that are dry, hard, or larger than normal. Eat foods that have a lot of fiber, such as beans, whole grains, and fresh fruits and vegetables. Drink enough fluid to keep your urine pale yellow. Take over-the-counter and prescription medicines only as told by your health care provider. This includes any fiber supplements. This information is not intended to replace advice given to you by your health care provider. Make sure you discuss any questions you have with your health care provider. Document Revised: 09/10/2019 Document Reviewed: 09/10/2019 Elsevier Patient Education  [place].

## 2021-12-23 ENCOUNTER — Other Ambulatory Visit: Payer: Self-pay

## 2021-12-23 ENCOUNTER — Ambulatory Visit (HOSPITAL_BASED_OUTPATIENT_CLINIC_OR_DEPARTMENT_OTHER)
Admission: RE | Admit: 2021-12-23 | Discharge: 2021-12-23 | Disposition: A | Discharge status: Home / Self Care | Payer: Federal, State, Local not specified - PPO | Source: Ambulatory Visit | Attending: Family Medicine | Admitting: Family Medicine

## 2021-12-23 DIAGNOSIS — R109 Unspecified abdominal pain: Secondary | ICD-10-CM | POA: Diagnosis not present

## 2021-12-23 DIAGNOSIS — K5901 Slow transit constipation: Secondary | ICD-10-CM | POA: Insufficient documentation

## 2021-12-23 DIAGNOSIS — R1032 Left lower quadrant pain: Secondary | ICD-10-CM | POA: Diagnosis not present

## 2021-12-23 LAB — CBC WITH DIFFERENTIAL/PLATELET
Basophils Absolute: 0 10*3/uL (ref 0.0–0.1)
Basophils Relative: 0.7 % (ref 0.0–3.0)
Eosinophils Absolute: 0 10*3/uL (ref 0.0–0.7)
Eosinophils Relative: 0.6 % (ref 0.0–5.0)
HCT: 42.2 % (ref 36.0–46.0)
Hemoglobin: 13.7 g/dL (ref 12.0–15.0)
Lymphocytes Relative: 25.8 % (ref 12.0–46.0)
Lymphs Abs: 1.8 10*3/uL (ref 0.7–4.0)
MCHC: 32.6 g/dL (ref 30.0–36.0)
MCV: 83.3 fl (ref 78.0–100.0)
Monocytes Absolute: 0.5 10*3/uL (ref 0.1–1.0)
Monocytes Relative: 7.1 % (ref 3.0–12.0)
Neutro Abs: 4.7 10*3/uL (ref 1.4–7.7)
Neutrophils Relative %: 65.8 % (ref 43.0–77.0)
Platelets: 181 10*3/uL (ref 150.0–400.0)
RBC: 5.06 Mil/uL (ref 3.87–5.11)
RDW: 13.7 % (ref 11.5–15.5)
WBC: 7.1 10*3/uL (ref 4.0–10.5)

## 2021-12-23 LAB — VITAMIN B12: Vitamin B-12: 868 pg/mL (ref 211–911)

## 2021-12-23 LAB — LIPASE: Lipase: 12 U/L (ref 11.0–59.0)

## 2021-12-23 LAB — HEMOGLOBIN A1C: Hgb A1c MFr Bld: 7 % — ABNORMAL HIGH (ref 4.6–6.5)

## 2021-12-23 LAB — AMYLASE: Amylase: 31 U/L (ref 27–131)

## 2021-12-23 MED ORDER — IOHEXOL 300 MG/ML  SOLN
100.0000 mL | Freq: Once | INTRAMUSCULAR | Status: AC | PRN
  Administered 2021-12-23: 100 mL via INTRAVENOUS

## 2021-12-26 ENCOUNTER — Other Ambulatory Visit: Payer: Self-pay | Admitting: Family Medicine

## 2021-12-26 ENCOUNTER — Encounter: Payer: Self-pay | Admitting: Family Medicine

## 2021-12-26 DIAGNOSIS — K649 Unspecified hemorrhoids: Secondary | ICD-10-CM | POA: Diagnosis not present

## 2021-12-26 DIAGNOSIS — N760 Acute vaginitis: Secondary | ICD-10-CM | POA: Diagnosis not present

## 2021-12-26 DIAGNOSIS — J301 Allergic rhinitis due to pollen: Secondary | ICD-10-CM

## 2021-12-30 ENCOUNTER — Encounter: Payer: Federal, State, Local not specified - PPO | Admitting: Family Medicine

## 2021-12-30 NOTE — Progress Notes (Incomplete)
Subjective:   By signing my name below, I, Zite Okoli, attest that this documentation has been prepared under the direction and in the presence of Ann Held, DO. 12/30/2021    Patient ID: Rhonda Morrow, female    DOB: [birth year], 61 y.o.   MRN: [id number]  No chief complaint on file.   HPI Patient is in today for a comprehensive physical exam.  She denies fever, hearing loss, ear pain,congestion, sinus pain, sore throat, eye pain, chest pain, palpitations, cough, shortness of breath, wheezing, nausea. vomiting, diarrhea, constipation, blood in stool, dysuria,frequency, hematuria and headaches.   Past Medical History:  Diagnosis Date   Arthritis    Asthma    Complication of anesthesia    one time woke up and was vey scared,17 yrs ago   Deaf    Diabetes mellitus without complication ([place])    GERD (gastroesophageal reflux disease)    Hypertension    Left groin pain    Thyroid disease     Past Surgical History:  Procedure Laterality Date   CARDIAC CATHETERIZATION     CESAREAN SECTION     CHOLECYSTECTOMY N/A 06/20/2016   Procedure: LAPAROSCOPIC CHOLECYSTECTOMY;  Surgeon: Rolm Bookbinder, MD;  Location: [place];  Service: General;  Laterality: N/A;   ESOPHAGEAL MANOMETRY N/A 09/29/2013   Procedure: ESOPHAGEAL MANOMETRY (EM);  Surgeon: Milus Banister, MD;  Location[facility];  Service: Endoscopy;  Laterality: N/A;   TOTAL HIP ARTHROPLASTY Left 02/21/2017   Procedure: LEFT TOTAL HIP ARTHROPLASTY ANTERIOR APPROACH;  Surgeon: Leandrew Koyanagi, MD;  Location: [facility];  Service: Orthopedics;  Laterality: Left;    Family History  Problem Relation Age of Onset   Diabetes Mother    Heart disease Mother    Diabetes Father     Social History   Socioeconomic History   Marital status: Married    Spouse name: Not on file   Number of children: Not on file   Years of education: Not on file   Highest education level: Not on file  Occupational History   Occupation: disabled   Tobacco Use   Smoking status: Never   Smokeless tobacco: Never  Vaping Use   Vaping Use: Never used  Substance and Sexual Activity   Alcohol use: No    Alcohol/week: 0.0 standard drinks   Drug use: No   Sexual activity: Never  Other Topics Concern   Not on file  Social History Narrative   Lives with family.  Has 3 children.  Works for [employer].  Education: high school.   Social Determinants of Health   Financial Resource Strain: Not on file  Food Insecurity: Not on file  Transportation Needs: Not on file  Physical Activity: Not on file  Stress: Not on file  Social Connections: Not on file  Intimate Partner Violence: Not on file    Outpatient Medications Prior to Visit  Medication Sig Dispense Refill   allopurinol (ZYLOPRIM) 100 MG tablet TAKE 1 TABLET BY MOUTH EVERY DAY 90 tablet 3   amLODipine (NORVASC) 5 MG tablet TAKE 1 TABLET (5 MG TOTAL) BY MOUTH DAILY. 90 tablet 1   blood glucose meter kit and supplies KIT Dispense based on patient and insurance preference. Use up to four times daily as directed. (FOR ICD-9 250.00, 250.01). 1 each 0   cetirizine (ZYRTEC) 10 MG tablet Take 1 tablet (10 mg total) by mouth daily. 30 tablet 11   fluticasone (FLONASE) 50 MCG/ACT nasal spray SPRAY 2 SPRAYS  INTO EACH NOSTRIL EVERY DAY 48 mL 2   metFORMIN (GLUCOPHAGE-XR) 500 MG 24 hr tablet TAKE 2 TABLET BY MOUTH EVERY DAY WITH BREAKFAST 180 tablet 1   rosuvastatin (CRESTOR) 20 MG tablet Take 1 tablet (20 mg total) by mouth at bedtime. 30 tablet 2   Facility-Administered Medications Prior to Visit  Medication Dose Route Frequency Provider Last Rate Last Admin   alum & mag hydroxide-simeth (MAALOX/MYLANTA) 200-200-20 MG/5ML suspension 30 mL  30 mL Oral Once Saguier, Edward, PA-C       lidocaine (XYLOCAINE) 2 % viscous mouth solution 15 mL  15 mL Mouth/Throat Once Saguier, Edward, PA-C       nitroGLYCERIN (NITRODUR - Dosed in mg/24 hr) patch 0.2 mg  0.2 mg Transdermal Daily Regal, Tamala Fothergill, DPM         Allergies  Allergen Reactions   Losartan Shortness Of Breath   Oxycodone-Acetaminophen Nausea And Vomiting   Augmentin [Amoxicillin-Pot Clavulanate] Diarrhea    Review of Systems  Constitutional:  Negative for fever.  HENT:  Negative for congestion, ear pain, hearing loss, sinus pain and sore throat.   Eyes:  Negative for blurred vision and pain.  Respiratory:  Negative for cough, sputum production, shortness of breath and wheezing.   Cardiovascular:  Negative for chest pain and palpitations.  Gastrointestinal:  Negative for blood in stool, constipation, diarrhea, nausea and vomiting.  Genitourinary:  Negative for dysuria, frequency, hematuria and urgency.  Musculoskeletal:  Negative for back pain, falls and myalgias.  Neurological:  Negative for dizziness, sensory change, loss of consciousness, weakness and headaches.  Endo/Heme/Allergies:  Negative for environmental allergies. Does not bruise/bleed easily.  Psychiatric/Behavioral:  Negative for depression and suicidal ideas. The patient is not nervous/anxious and does not have insomnia.       Objective:    Physical Exam Constitutional:      General: She is not in acute distress.    Appearance: Normal appearance. She is not ill-appearing.  HENT:     Head: Normocephalic and atraumatic.     Right Ear: External ear normal.     Left Ear: External ear normal.  Eyes:     Extraocular Movements: Extraocular movements intact.     Pupils: Pupils are equal, round, and reactive to light.  Cardiovascular:     Rate and Rhythm: Normal rate and regular rhythm.     Pulses: Normal pulses.     Heart sounds: Normal heart sounds. No murmur heard.   No gallop.  Pulmonary:     Effort: Pulmonary effort is normal. No respiratory distress.     Breath sounds: Normal breath sounds. No wheezing, rhonchi or rales.  Abdominal:     General: Bowel sounds are normal. There is no distension.     Palpations: Abdomen is soft. There is no mass.      Tenderness: There is no abdominal tenderness. There is no guarding or rebound.     Hernia: No hernia is present.  Musculoskeletal:     Cervical back: Normal range of motion and neck supple.  Lymphadenopathy:     Cervical: No cervical adenopathy.  Skin:    General: Skin is warm and dry.  Neurological:     Mental Status: She is alert and oriented to person, place, and time.  Psychiatric:        Behavior: Behavior normal.    LMP 05/20/2012  Wt Readings from Last 3 Encounters:  12/22/21 225 lb 3.2 oz (102.2 kg)  12/05/21 223 lb 12.8 oz (101.5 kg)  10/17/21 231 lb 3.2 oz (104.9 kg)    Diabetic Foot Exam - Simple   No data filed    Lab Results  Component Value Date   WBC 7.1 12/22/2021   HGB 13.7 12/22/2021   HCT 42.2 12/22/2021   PLT 181.0 12/22/2021   GLUCOSE 193 (H) 12/22/2021   CHOL 176 12/05/2021   TRIG 85.0 12/05/2021   HDL 55.10 12/05/2021   LDLCALC 104 (H) 12/05/2021   ALT 11 12/22/2021   AST 12 12/22/2021   NA 139 12/22/2021   K 3.4 (L) 12/22/2021   CL 102 12/22/2021   CREATININE 1.06 (H) 12/22/2021   BUN 17 12/22/2021   CO2 25 12/22/2021   TSH 0.93 12/22/2021   INR 1.05 02/12/2017   HGBA1C 7.0 (H) 12/22/2021   MICROALBUR 2.6 (H) 03/22/2021    Lab Results  Component Value Date   TSH 0.93 12/22/2021   Lab Results  Component Value Date   WBC 7.1 12/22/2021   HGB 13.7 12/22/2021   HCT 42.2 12/22/2021   MCV 83.3 12/22/2021   PLT 181.0 12/22/2021   Lab Results  Component Value Date   NA 139 12/22/2021   K 3.4 (L) 12/22/2021   CO2 25 12/22/2021   GLUCOSE 193 (H) 12/22/2021   BUN 17 12/22/2021   CREATININE 1.06 (H) 12/22/2021   BILITOT 0.4 12/22/2021   ALKPHOS 72 12/05/2021   AST 12 12/22/2021   ALT 11 12/22/2021   PROT 6.7 12/22/2021   ALBUMIN 4.0 12/05/2021   CALCIUM 9.4 12/22/2021   ANIONGAP 13 05/24/2020   GFR 71.56 12/05/2021   Lab Results  Component Value Date   CHOL 176 12/05/2021   Lab Results  Component Value Date   HDL 55.10  12/05/2021   Lab Results  Component Value Date   LDLCALC 104 (H) 12/05/2021   Lab Results  Component Value Date   TRIG 85.0 12/05/2021   Lab Results  Component Value Date   CHOLHDL 3 12/05/2021   Lab Results  Component Value Date   HGBA1C 7.0 (H) 12/22/2021       Colonoscopy- Last completed on 09/17/2013. One polyp was found, removed and sent to pathology. Otherwise exam was normal. Repeat in 10 years. Dexa- Last checked on 03/08/2021. Patient was considered osteopenic according to the [facility]. Repeat in 3 years. Pap Smear- Last checked on 03/03/2020. Results were normal. Repeat in 3-5 years. Mammogram- Last checked on 10/10/2021. Results were normal. Repeat in 1 year  Assessment & Plan:   Problem List Items Addressed This Visit   None    No orders of the defined types were placed in this encounter.   I,Zite Okoli,acting as a Education administrator for [employer], DO.,have documented all relevant documentation on the behalf of Ann Held, DO,as directed by  Ann Held, DO while in the presence of [place], DO. , personally preformed the services described in this documentation.  All medical record entries made by the scribe were at my direction and in my presence.  I have reviewed the chart and discharge instructions (if applicable) and agree that the record reflects my personal performance and is accurate and complete. 12/30/2021

## 2022-01-11 ENCOUNTER — Encounter: Payer: Self-pay | Admitting: Pulmonary Disease

## 2022-01-11 ENCOUNTER — Ambulatory Visit (INDEPENDENT_AMBULATORY_CARE_PROVIDER_SITE_OTHER): Payer: Federal, State, Local not specified - PPO | Admitting: Pulmonary Disease

## 2022-01-11 VITALS — BP 128/74 | HR 84 | Temp 97.7°F | Ht 64.0 in | Wt 221.0 lb

## 2022-01-11 DIAGNOSIS — Z6841 Body Mass Index (BMI) 40.0 and over, adult: Secondary | ICD-10-CM | POA: Diagnosis not present

## 2022-01-11 DIAGNOSIS — J4520 Mild intermittent asthma, uncomplicated: Secondary | ICD-10-CM

## 2022-01-11 DIAGNOSIS — R0789 Other chest pain: Secondary | ICD-10-CM | POA: Diagnosis not present

## 2022-01-11 MED ORDER — BREZTRI AEROSPHERE 160-9-4.8 MCG/ACT IN AERO: 2.0000 | INHALATION_SPRAY | Freq: Two times a day (BID) | RESPIRATORY_TRACT | 0 refills | Status: DC

## 2022-01-11 MED ORDER — ALBUTEROL SULFATE HFA 108 (90 BASE) MCG/ACT IN AERS: 2.0000 | INHALATION_SPRAY | Freq: Four times a day (QID) | RESPIRATORY_TRACT | 6 refills | Status: DC | PRN

## 2022-01-11 NOTE — Patient Instructions (Signed)
Thank you for visiting Dr. Valeta Harms at [facility]. ?Today we recommend the following: ?No orders of the defined types were placed in this encounter. ? ?Meds ordered this encounter  ?Medications  ? Budeson-Glycopyrrol-Formoterol (BREZTRI AEROSPHERE) 160-9-4.8 MCG/ACT AERO  ?  Sig: Inhale 2 puffs into the lungs in the morning and at bedtime.  ?  Dispense:  10.7 g  ?  Refill:  0  ?  Order Specific Question:   Lot Number?  ?  Answer:   [id number]  ?  Order Specific Question:   Expiration Date?  ?  Answer:   06/06/2024  ?  Order Specific Question:   Manufacturer?  ?  Answer:   [employer] [[id number]]  ? Budeson-Glycopyrrol-Formoterol (BREZTRI AEROSPHERE) 160-9-4.8 MCG/ACT AERO  ?  Sig: Inhale 2 puffs into the lungs in the morning and at bedtime.  ?  Dispense:  10.7 g  ?  Refill:  0  ? albuterol (VENTOLIN HFA) 108 (90 Base) MCG/ACT inhaler  ?  Sig: Inhale 2 puffs into the lungs every 6 (six) hours as needed for wheezing or shortness of breath.  ?  Dispense:  8 g  ?  Refill:  6  ? ?Return in about 1 year (around 01/12/2023) for w/ Dr. Valeta Harms . ? ? ? ?Please do your part to reduce the spread of COVID-19.  ? ?

## 2022-01-11 NOTE — Progress Notes (Signed)
Synopsis: Referred in January 2020 as a self-referral for shortness of breath patient's PCP PJ:KDTOI Koren Shiver, *  Subjective:   PATIENT ID: Rhonda Morrow GENDER: female DOB: Jan 11, 1961, MRN: [id number]  Chief Complaint  Patient presents with   Follow-up    Patient needs refill on inhalers. Patient is having trouble breathing.     This is a 61 year old that was recently seen in the emergency room for shortness of breath as well as vomiting.  She was diagnosed with a flulike illness and otherwise has been doing well since then.  She has a diagnosis of asthma since childhood.  She currently works at the [employer].  She has noticed increased shortness of breath with exertion.  She has been using her albuterol nebulizer as needed.  She does state that her husband put 3 vials of albuterol in her nebulizer which made her very tremulous.  She has been using Dulera but finds the inhaler expensive.  She does think that her symptoms from her asthma are okay controlled but have flared up since her recent illness.  She is slowly getting better.  Her shortness of breath only present with significant exertion.  Patient denies chest pain, nausea vomiting, diarrhea, hemoptysis.  OV 12/17/2018: Was at therapy yesterday for her should and developed SOB. She difficulty breathing and noticed chest tightness. Ended up going home and later last night she as having more SOB and chest tightness.  Further investigation of the symptoms appear to have only started when she was walking briskly on a treadmill.  She started to feel significantly short of breath and inability to catch her breath.  She had to stop this exercise.  She sat rested for a few minutes and then decided to attempt lifting weights.  Lifting weights she also experienced dyspnea.  Patient only denies chest tightness and the ability to catch her breath.  Of note she did not take any of her inhaler medications prior to her attempt at exercising.  Once  she took her inhalers when she got home she felt much better and her symptoms dissipated she also felt better with the use of her nebulizer in the middle of the night because she was having some trouble sleeping.  She also did not take her inhaler medications this morning prior to coming to the office today.  Does not appear to be that she has been using them on a regular basis and she has only been using them "as needed".  OV 11/15/2020: Patient last seen in our office on 04/01/2020, Wyn Quaker, NP.  At the time using albuterol for her asthma.  Not using Symbicort.  Was counseled on resuming Symbicort 160.  Here today for asthma follow-up. Patient tries to be compliant with her Symbicort. Sometimes she forgets her evening dose. She does remember to take it in the morning. She is rarely using her albuterol inhaler. But she brings it with her in case of emergencies. No recent exacerbations. Working full-time at the post [employer]. She said it has been very hectic recently at work.  OV 01/11/2022: Here today for follow-up regarding mild intermittent asthma symptoms.  Accompanied by ASL interpreter.  Overall doing well.  She still has shortness of breath and occasional difficulty breathing.  She intermittently has been using inhalers.  Unfortunately ran out of her inhaler started having more shortness of breath while at work.  She is working full-time at the post [employer].  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, community but her co-pays  for the previous medications had increased significantly and she was worried about cost for inhaler so she comes today talked about different options for inhalers   Past Medical History:  Diagnosis Date   Arthritis    Asthma    Complication of anesthesia    one time woke up and was vey scared,17 yrs ago   Deaf    Diabetes mellitus without complication ([place])    GERD (gastroesophageal reflux disease)    Hypertension    Left groin pain    Thyroid disease      Family History  Problem  Relation Age of Onset   Diabetes Mother    Heart disease Mother    Diabetes Father      Past Surgical History:  Procedure Laterality Date   CARDIAC CATHETERIZATION     CESAREAN SECTION     CHOLECYSTECTOMY N/A 06/20/2016   Procedure: LAPAROSCOPIC CHOLECYSTECTOMY;  Surgeon: Rolm Bookbinder, MD;  Location: [facility];  Service: General;  Laterality: N/A;   ESOPHAGEAL MANOMETRY N/A 09/29/2013   Procedure: ESOPHAGEAL MANOMETRY (EM);  Surgeon: Milus Banister, MD;  Location: [facility];  Service: Endoscopy;  Laterality: N/A;   TOTAL HIP ARTHROPLASTY Left 02/21/2017   Procedure: LEFT TOTAL HIP ARTHROPLASTY ANTERIOR APPROACH;  Surgeon: Leandrew Koyanagi, MD;  Location: [facility];  Service: Orthopedics;  Laterality: Left;    Social History   Socioeconomic History   Marital status: Married    Spouse name: Not on file   Number of children: Not on file   Years of education: Not on file   Highest education level: Not on file  Occupational History   Occupation: disabled  Tobacco Use   Smoking status: Never   Smokeless tobacco: Never  Vaping Use   Vaping Use: Never used  Substance and Sexual Activity   Alcohol use: No    Alcohol/week: 0.0 standard drinks   Drug use: No   Sexual activity: Never  Other Topics Concern   Not on file  Social History Narrative   Lives with family.  Has 3 children.  Works for [employer].  Education: high school.   Social Determinants of Health   Financial Resource Strain: Not on file  Food Insecurity: Not on file  Transportation Needs: Not on file  Physical Activity: Not on file  Stress: Not on file  Social Connections: Not on file  Intimate Partner Violence: Not on file     Allergies  Allergen Reactions   Losartan Shortness Of Breath   Oxycodone-Acetaminophen Nausea And Vomiting   Augmentin [Amoxicillin-Pot Clavulanate] Diarrhea     Outpatient Medications Prior to Visit  Medication Sig Dispense Refill   allopurinol (ZYLOPRIM) 100 MG tablet TAKE 1 TABLET  BY MOUTH EVERY DAY 90 tablet 3   amLODipine (NORVASC) 5 MG tablet TAKE 1 TABLET (5 MG TOTAL) BY MOUTH DAILY. 90 tablet 1   blood glucose meter kit and supplies KIT Dispense based on patient and insurance preference. Use up to four times daily as directed. (FOR ICD-9 250.00, 250.01). 1 each 0   cetirizine (ZYRTEC) 10 MG tablet Take 1 tablet (10 mg total) by mouth daily. 30 tablet 11   fluticasone (FLONASE) 50 MCG/ACT nasal spray SPRAY 2 SPRAYS INTO EACH NOSTRIL EVERY DAY 48 mL 2   metFORMIN (GLUCOPHAGE-XR) 500 MG 24 hr tablet TAKE 2 TABLET BY MOUTH EVERY DAY WITH BREAKFAST 180 tablet 1   rosuvastatin (CRESTOR) 20 MG tablet Take 1 tablet (20 mg total) by mouth at bedtime. 30 tablet 2  Facility-Administered Medications Prior to Visit  Medication Dose Route Frequency Provider Last Rate Last Admin   alum & mag hydroxide-simeth (MAALOX/MYLANTA) 200-200-20 MG/5ML suspension 30 mL  30 mL Oral Once Saguier, Percell Miller, PA-C       lidocaine (XYLOCAINE) 2 % viscous mouth solution 15 mL  15 mL Mouth/Throat Once Saguier, Edward, PA-C       nitroGLYCERIN (NITRODUR - Dosed in mg/24 hr) patch 0.2 mg  0.2 mg Transdermal Daily Wallene Huh, DPM        Review of Systems  Constitutional:  Negative for chills, fever, malaise/fatigue and weight loss.  HENT:  Negative for hearing loss, sore throat and tinnitus.   Eyes:  Negative for blurred vision and double vision.  Respiratory:  Positive for shortness of breath. Negative for cough, hemoptysis, sputum production, wheezing and stridor.   Cardiovascular:  Negative for chest pain, palpitations, orthopnea, leg swelling and PND.  Gastrointestinal:  Negative for abdominal pain, constipation, diarrhea, heartburn, nausea and vomiting.  Genitourinary:  Negative for dysuria, hematuria and urgency.  Musculoskeletal:  Negative for joint pain and myalgias.  Skin:  Negative for itching and rash.  Neurological:  Negative for dizziness, tingling, weakness and headaches.   Endo/Heme/Allergies:  Negative for environmental allergies. Does not bruise/bleed easily.  Psychiatric/Behavioral:  Negative for depression. The patient is not nervous/anxious and does not have insomnia.   All other systems reviewed and are negative.   Objective:  Physical Exam Vitals reviewed.  Constitutional:      General: She is not in acute distress.    Appearance: She is well-developed. She is obese.  HENT:     Head: Normocephalic and atraumatic.  Eyes:     General: No scleral icterus.    Conjunctiva/sclera: Conjunctivae normal.     Pupils: Pupils are equal, round, and reactive to light.  Neck:     Vascular: No JVD.     Trachea: No tracheal deviation.  Cardiovascular:     Rate and Rhythm: Normal rate and regular rhythm.     Heart sounds: Normal heart sounds. No murmur heard. Pulmonary:     Effort: Pulmonary effort is normal. No tachypnea, accessory muscle usage or respiratory distress.     Breath sounds: No stridor. No wheezing, rhonchi or rales.  Abdominal:     General: There is no distension.     Palpations: Abdomen is soft.     Tenderness: There is no abdominal tenderness.  Musculoskeletal:        General: No tenderness.     Cervical back: Neck supple.  Lymphadenopathy:     Cervical: No cervical adenopathy.  Skin:    General: Skin is warm and dry.     Capillary Refill: Capillary refill takes less than 2 seconds.     Findings: No rash.  Neurological:     Mental Status: She is alert and oriented to person, place, and time.  Psychiatric:        Behavior: Behavior normal.     Vitals:   01/11/22 1622  BP: 128/74  Pulse: 84  Temp: 97.7 F (36.5 C)  TempSrc: Oral  SpO2: 99%  Weight: 221 lb (100.2 kg)  Height: _0  (1.626 m)   99% on RA BMI Readings from Last 3 Encounters:  01/11/22 37.93 kg/m  12/22/21 38.66 kg/m  12/05/21 38.42 kg/m   Wt Readings from Last 3 Encounters:  01/11/22 221 lb (100.2 kg)  12/22/21 225 lb 3.2 oz (102.2 kg)  12/05/21  223 lb 12.8 oz (101.5  kg)     CBC    Component Value Date/Time   WBC 7.1 12/22/2021 1542   RBC 5.06 12/22/2021 1542   HGB 13.7 12/22/2021 1542   HCT 42.2 12/22/2021 1542   PLT 181.0 12/22/2021 1542   MCV 83.3 12/22/2021 1542   MCH 27.8 05/24/2020 0540   MCHC 32.6 12/22/2021 1542   RDW 13.7 12/22/2021 1542   LYMPHSABS 1.8 12/22/2021 1542   MONOABS 0.5 12/22/2021 1542   EOSABS 0.0 12/22/2021 1542   BASOSABS 0.0 12/22/2021 1542   Chest Imaging: Chest x-ray 11/17/2016: Enlarged cardiac silhouette, no pulmonary edema The patient's images have been independently reviewed by me.    Pulmonary Functions Testing Results: PFT Results Latest Ref Rng & Units 01/22/2019 07/21/2013  FVC-Pre L 1.60 -  FVC-Predicted Pre % 61 56  FVC-Post L 1.66 -  FVC-Predicted Post % 63 -  Pre FEV1/FVC % % 89 89  Post FEV1/FCV % % 88 -  FEV1-Pre L 1.42 1.37  FEV1-Predicted Pre % 69 62  FEV1-Post L 1.45 -  DLCO uncorrected ml/min/mmHg 17.81 -  DLCO UNC% % 91 -  DLCO corrected ml/min/mmHg 17.03 -  DLCO COR %Predicted % 87 -  DLVA Predicted % 148 -  TLC L 3.13 -  TLC % Predicted % 65 -  RV % Predicted % 65 -   FeNO: No results found for: NITRICOXIDE  Pathology: None   Echocardiogram: 04/12/2016: Preserved EF 60-65%  Heart Catheterization: None     Assessment & Plan:   Mild intermittent asthma, unspecified whether complicated  BMI 03.4-74.2, adult (HCC)  Chest tightness  Discussion:  This is a 61 year old female moderate persistent asthma and obesity.  She had been on Symbicort for a long time however still having breakthrough symptoms but was also unable to use Symbicort steadily due to cost of inhaler.  Rarely using her albuterol  Plan: We will give her Breztri samples and a new prescription $0 co-pay card for [place]. Hopefully this will help her be on a regular maintenance medicine and to help with cost of medications. New prescription for albuterol as well as a rescue  inhaler. Encouraged her to continue to walk and participate in her weight loss program.  She wants to continue to do that.  She is can let us know if the new inhaler regimen is working for her.  She can return to see [place] in 1 year or as needed.   Current Outpatient Medications:    allopurinol (ZYLOPRIM) 100 MG tablet, TAKE 1 TABLET BY MOUTH EVERY DAY, Disp: 90 tablet, Rfl: 3   amLODipine (NORVASC) 5 MG tablet, TAKE 1 TABLET (5 MG TOTAL) BY MOUTH DAILY., Disp: 90 tablet, Rfl: 1   blood glucose meter kit and supplies KIT, Dispense based on patient and insurance preference. Use up to four times daily as directed. (FOR ICD-9 250.00, 250.01)., Disp: 1 each, Rfl: 0   cetirizine (ZYRTEC) 10 MG tablet, Take 1 tablet (10 mg total) by mouth daily., Disp: 30 tablet, Rfl: 11   fluticasone (FLONASE) 50 MCG/ACT nasal spray, SPRAY 2 SPRAYS INTO EACH NOSTRIL EVERY DAY, Disp: 48 mL, Rfl: 2   metFORMIN (GLUCOPHAGE-XR) 500 MG 24 hr tablet, TAKE 2 TABLET BY MOUTH EVERY DAY WITH BREAKFAST, Disp: 180 tablet, Rfl: 1   rosuvastatin (CRESTOR) 20 MG tablet, Take 1 tablet (20 mg total) by mouth at bedtime., Disp: 30 tablet, Rfl: 2  Current Facility-Administered Medications:    alum & mag hydroxide-simeth (MAALOX/MYLANTA) 200-200-20 MG/5ML suspension 30 mL,  30 mL, Oral, Once, Saguier, Percell Miller, PA-C   lidocaine (XYLOCAINE) 2 % viscous mouth solution 15 mL, 15 mL, Mouth/Throat, Once, Saguier, Edward, PA-C   nitroGLYCERIN (NITRODUR - Dosed in mg/24 hr) patch 0.2 mg, 0.2 mg, Transdermal, Daily, Regal, Tamala Fothergill, DPM   Garner Nash, DO [facility] 01/11/2022 4:31 PM

## 2022-01-13 ENCOUNTER — Telehealth: Payer: Self-pay | Admitting: Family Medicine

## 2022-01-13 NOTE — Telephone Encounter (Signed)
Pt dropped off paperwork to be filled out by provider [facility] ) Pt would like to be called when document ready to  pick up at [phone]. Document put at front office tray under providers name. Paperwork is for [place] who is taking care of [employer]. Pt stated needing paperwork asap. ?

## 2022-01-16 ENCOUNTER — Ambulatory Visit: Payer: Federal, State, Local not specified - PPO | Admitting: Family Medicine

## 2022-01-17 ENCOUNTER — Encounter: Payer: Self-pay | Admitting: Family Medicine

## 2022-01-17 ENCOUNTER — Ambulatory Visit (INDEPENDENT_AMBULATORY_CARE_PROVIDER_SITE_OTHER): Payer: Federal, State, Local not specified - PPO | Admitting: Family Medicine

## 2022-01-17 VITALS — BP 138/88 | HR 74 | Temp 98.9°F | Resp 18 | Ht 64.0 in | Wt 220.8 lb

## 2022-01-17 DIAGNOSIS — M1A09X0 Idiopathic chronic gout, multiple sites, without tophus (tophi): Secondary | ICD-10-CM

## 2022-01-17 DIAGNOSIS — H1013 Acute atopic conjunctivitis, bilateral: Secondary | ICD-10-CM

## 2022-01-17 DIAGNOSIS — Z0000 Encounter for general adult medical examination without abnormal findings: Secondary | ICD-10-CM | POA: Diagnosis not present

## 2022-01-17 DIAGNOSIS — E1165 Type 2 diabetes mellitus with hyperglycemia: Secondary | ICD-10-CM

## 2022-01-17 DIAGNOSIS — I10 Essential (primary) hypertension: Secondary | ICD-10-CM | POA: Diagnosis not present

## 2022-01-17 MED ORDER — NAPHAZOLINE-PHENIRAMINE 0.025-0.3 % OP SOLN: 1.0000 [drp] | Freq: Four times a day (QID) | OPHTHALMIC | 0 refills | Status: DC | PRN

## 2022-01-17 NOTE — Patient Instructions (Signed)

## 2022-01-17 NOTE — Telephone Encounter (Signed)
Received

## 2022-01-17 NOTE — Progress Notes (Signed)
Subjective:  ?  ? Rhonda Morrow is a 61 y.o. female and is here for a comprehensive physical exam. The patient reports no problems. ? ?Social History  ? ?Socioeconomic History  ? Marital status: Married  ?  Spouse name: Not on file  ? Number of children: Not on file  ? Years of education: Not on file  ? Highest education level: Not on file  ?Occupational History  ? Occupation: disabled  ?Tobacco Use  ? Smoking status: Never  ? Smokeless tobacco: Never  ?Vaping Use  ? Vaping Use: Never used  ?Substance and Sexual Activity  ? Alcohol use: No  ?  Alcohol/week: 0.0 standard drinks  ? Drug use: No  ? Sexual activity: Never  ?Other Topics Concern  ? Not on file  ?Social History Narrative  ? Lives with family.  Has 3 children.  Works for [employer].  Education: high school.  ? ?Social Determinants of Health  ? ?Financial Resource Strain: Not on file  ?Food Insecurity: Not on file  ?Transportation Needs: Not on file  ?Physical Activity: Not on file  ?Stress: Not on file  ?Social Connections: Not on file  ?Intimate Partner Violence: Not on file  ? ?Health Maintenance  ?Topic Date Due  ? OPHTHALMOLOGY EXAM  10/17/2017  ? FOOT EXAM  07/22/2021  ? URINE MICROALBUMIN  03/22/2022  ? TETANUS/TDAP  05/12/2022  ? HEMOGLOBIN A1C  06/21/2022  ? PAP SMEAR-Modifier  03/04/2023  ? DEXA SCAN  03/09/2023  ? COLONOSCOPY (Pts 45-39yr Insurance coverage will need to be confirmed)  09/18/2023  ? MAMMOGRAM  10/11/2023  ? INFLUENZA VACCINE  Completed  ? COVID-19 Vaccine  Completed  ? Hepatitis C Screening  Completed  ? HIV Screening  Completed  ? Zoster Vaccines- Shingrix  Completed  ? HPV VACCINES  Aged Out  ? ? ?The following portions of the patient's history were reviewed and updated as appropriate: She  has a past medical history of Arthritis, Asthma, Complication of anesthesia, Deaf, Diabetes [place], GERD (gastroesophageal reflux disease), Hypertension, Left groin pain, and Thyroid disease. ?She does not have any  pertinent problems on file. ?She  has a past surgical history that includes Cesarean section; Cardiac catheterization; Esophageal manometry (N/A, 09/29/2013); Cholecystectomy (N/A, 06/20/2016); and Total hip arthroplasty (Left, 02/21/2017). ?Her family history includes Diabetes in her father and mother; Heart disease in her mother. ?She  reports that she has never smoked. She has never used smokeless tobacco. She reports that she does not drink alcohol and does not use drugs. ?She has a current medication list which includes the following prescription(s): albuterol, allopurinol, amlodipine, blood glucose meter kit and supplies, breztri aerosphere, breztri aerosphere, cetirizine, fluticasone, metformin, and rosuvastatin, and the following Facility-Administered Medications: alum & mag hydroxide-simeth, lidocaine, and nitroglycerin. ?Current Outpatient Medications on File Prior to Visit  ?Medication Sig Dispense Refill  ? albuterol (VENTOLIN HFA) 108 (90 Base) MCG/ACT inhaler Inhale 2 puffs into the lungs every 6 (six) hours as needed for wheezing or shortness of breath. 8 g 6  ? allopurinol (ZYLOPRIM) 100 MG tablet TAKE 1 TABLET BY MOUTH EVERY DAY 90 tablet 3  ? amLODipine (NORVASC) 5 MG tablet TAKE 1 TABLET (5 MG TOTAL) BY MOUTH DAILY. 90 tablet 1  ? blood glucose meter kit and supplies KIT Dispense based on patient and insurance preference. Use up to four times daily as directed. (FOR ICD-9 250.00, 250.01). 1 each 0  ? Budeson-Glycopyrrol-Formoterol (BREZTRI AEROSPHERE) 160-9-4.8 MCG/ACT AERO Inhale 2  puffs into the lungs in the morning and at bedtime. 10.7 g 0  ? Budeson-Glycopyrrol-Formoterol (BREZTRI AEROSPHERE) 160-9-4.8 MCG/ACT AERO Inhale 2 puffs into the lungs in the morning and at bedtime. 10.7 g 0  ? cetirizine (ZYRTEC) 10 MG tablet Take 1 tablet (10 mg total) by mouth daily. 30 tablet 11  ? fluticasone (FLONASE) 50 MCG/ACT nasal spray SPRAY 2 SPRAYS INTO EACH NOSTRIL EVERY DAY 48 mL 2  ? metFORMIN  (GLUCOPHAGE-XR) 500 MG 24 hr tablet TAKE 2 TABLET BY MOUTH EVERY DAY WITH BREAKFAST 180 tablet 1  ? rosuvastatin (CRESTOR) 20 MG tablet Take 1 tablet (20 mg total) by mouth at bedtime. 30 tablet 2  ? ?Current Facility-Administered Medications on File Prior to Visit  ?Medication Dose Route Frequency Provider Last Rate Last Admin  ? alum & mag hydroxide-simeth (MAALOX/MYLANTA) 200-200-20 MG/5ML suspension 30 mL  30 mL Oral Once Saguier, Percell Miller, PA-C      ? lidocaine (XYLOCAINE) 2 % viscous mouth solution 15 mL  15 mL Mouth/Throat Once Saguier, Edward, PA-C      ? nitroGLYCERIN (NITRODUR - Dosed in mg/24 hr) patch 0.2 mg  0.2 mg Transdermal Daily Wallene Huh, DPM      ? ?She is allergic to losartan, oxycodone-acetaminophen, and augmentin [amoxicillin-pot clavulanate].. ? ?Review of Systems ?Review of Systems  ?Constitutional: Negative for activity change, appetite change and fatigue.  ?HENT: Negative for hearing loss, congestion, tinnitus and ear discharge.  dentist q36m?Eyes: Negative for visual disturbance (see optho q1y -- vision corrected to 20/20 with glasses).  ?Respiratory: Negative for cough, chest tightness and shortness of breath.   ?Cardiovascular: Negative for chest pain, palpitations and leg swelling.  ?Gastrointestinal: Negative for abdominal pain, diarrhea, constipation and abdominal distention.  ?Genitourinary: Negative for urgency, frequency, decreased urine volume and difficulty urinating.  ?Musculoskeletal: Negative for back pain, arthralgias and gait problem.  ?Skin: Negative for color change, pallor and rash.  ?Neurological: Negative for dizziness, light-headedness, numbness and headaches.  ?Hematological: Negative for adenopathy. Does not bruise/bleed easily.  ?Psychiatric/Behavioral: Negative for suicidal ideas, confusion, sleep disturbance, self-injury, dysphoric mood, decreased concentration and agitation.  ? ? ? ? ?Objective:  ? ? BP 138/88 (BP Location: Left Arm, Patient Position:  Sitting, Cuff Size: Normal)   Pulse 74   Temp 98.9 ?F (37.2 ?C) (Oral)   Resp 18   Ht 5' 4"  (1.626 m)   Wt 220 lb 12.8 oz (100.2 kg)   LMP 05/20/2012   SpO2 99%   BMI 37.90 kg/m?  ?General appearance: alert, cooperative, appears stated age, and no distress ?Head: Normocephalic, without obvious abnormality, atraumatic ?Eyes: conjunctivae/corneas clear. PERRL, EOM's intact. Fundi benign. ?Ears: normal TM's and external ear canals both ears ?Nose: Nares normal. Septum midline. Mucosa normal. No drainage or sinus tenderness. ?Throat: lips, mucosa, and tongue normal; teeth and gums normal ?Neck: no adenopathy, no carotid bruit, no JVD, supple, symmetrical, trachea midline, and thyroid not enlarged, symmetric, no tenderness/mass/nodules ?Lungs: clear to auscultation bilaterally ?Heart: regular rate and rhythm, S1, S2 normal, no murmur, click, rub or gallop ?Abdomen: soft, non-tender; bowel sounds normal; no masses,  no organomegaly ?Extremities: extremities normal, atraumatic, no cyanosis or edema ?Pulses: 2+ and symmetric ?Skin: Skin color, texture, turgor normal. No rashes or lesions ?Lymph nodes: Cervical, supraclavicular, and axillary nodes normal. ?Neurologic: Alert and oriented X 3, normal strength and tone. Normal symmetric reflexes. Normal coordination and gait  ?  ?Assessment:  ? ? Healthy female exam.    ?  ?Plan:  ? ?  Ghm utd ?Check labs  ? ?1. Type 2 diabetes mellitus with hyperglycemia, without long-term current use of insulin ([place]) ?hgba1c to be checked  minimize simple carbs. Increase exercise as tolerated. Continue current meds ? ?- CBC with Differential/Platelet ?- Comprehensive metabolic panel ?- Hemoglobin A1c ?- Microalbumin / creatinine urine ratio ?- TSH ? ?2. Primary hypertension ?Well controlled, no changes to meds. Encouraged heart healthy diet such as the DASH diet and exercise as tolerated.   ?- CBC with Differential/Platelet ?- Comprehensive metabolic panel ?- Hemoglobin A1c ?-  Microalbumin / creatinine urine ratio ?- TSH ? ?3. Preventative health care ?See above ?- CBC with Differential/Platelet ?- Comprehensive metabolic panel ?- Hemoglobin A1c ?- Microalbumin / creatinine urine ratio ?- TSH ? ?4. Chroni

## 2022-01-18 LAB — COMPREHENSIVE METABOLIC PANEL
ALT: 12 U/L (ref 0–35)
AST: 13 U/L (ref 0–37)
Albumin: 3.9 g/dL (ref 3.5–5.2)
Alkaline Phosphatase: 71 U/L (ref 39–117)
BUN: 13 mg/dL (ref 6–23)
CO2: 33 mEq/L — ABNORMAL HIGH (ref 19–32)
Calcium: 9.8 mg/dL (ref 8.4–10.5)
Chloride: 100 mEq/L (ref 96–112)
Creatinine, Ser: 0.96 mg/dL (ref 0.40–1.20)
GFR: 65.29 mL/min (ref >60.00)
Glucose, Bld: 142 mg/dL — ABNORMAL HIGH (ref 70–99)
Potassium: 3.5 mEq/L (ref 3.5–5.1)
Sodium: 140 mEq/L (ref 135–145)
Total Bilirubin: 0.4 mg/dL (ref 0.2–1.2)
Total Protein: 6.8 g/dL (ref 6.0–8.3)

## 2022-01-18 LAB — MICROALBUMIN / CREATININE URINE RATIO
Creatinine,U: 240.6 mg/dL
Microalb Creat Ratio: 1.4 mg/g (ref 0.0–30.0)
Microalb, Ur: 3.3 mg/dL — ABNORMAL HIGH (ref 0.0–1.9)

## 2022-01-18 LAB — HEMOGLOBIN A1C: Hgb A1c MFr Bld: 7.2 % — ABNORMAL HIGH (ref 4.6–6.5)

## 2022-01-18 LAB — CBC WITH DIFFERENTIAL/PLATELET
Basophils Absolute: 0.1 10*3/uL (ref 0.0–0.1)
Basophils Relative: 1 % (ref 0.0–3.0)
Eosinophils Absolute: 0.1 10*3/uL (ref 0.0–0.7)
Eosinophils Relative: 1 % (ref 0.0–5.0)
HCT: 42.8 % (ref 36.0–46.0)
Hemoglobin: 14.2 g/dL (ref 12.0–15.0)
Lymphocytes Relative: 23.6 % (ref 12.0–46.0)
Lymphs Abs: 1.8 10*3/uL (ref 0.7–4.0)
MCHC: 33 g/dL (ref 30.0–36.0)
MCV: 83.2 fl (ref 78.0–100.0)
Monocytes Absolute: 0.6 10*3/uL (ref 0.1–1.0)
Monocytes Relative: 7.9 % (ref 3.0–12.0)
Neutro Abs: 5.2 10*3/uL (ref 1.4–7.7)
Neutrophils Relative %: 66.5 % (ref 43.0–77.0)
Platelets: 180 10*3/uL (ref 150.0–400.0)
RBC: 5.15 Mil/uL — ABNORMAL HIGH (ref 3.87–5.11)
RDW: 14.2 % (ref 11.5–15.5)
WBC: 7.8 10*3/uL (ref 4.0–10.5)

## 2022-01-18 LAB — TSH: TSH: 0.86 u[IU]/mL (ref 0.35–5.50)

## 2022-01-18 LAB — URIC ACID: Uric Acid, Serum: 5.1 mg/dL (ref 2.4–7.0)

## 2022-01-19 ENCOUNTER — Encounter: Payer: Self-pay | Admitting: Family Medicine

## 2022-01-19 DIAGNOSIS — Z789 Other specified health status: Secondary | ICD-10-CM | POA: Insufficient documentation

## 2022-01-19 NOTE — Telephone Encounter (Signed)
Pt called. Inter[employer]ter left message that forms were ready for pick up. Forms placed at the front and 1 copy given to [place] ?

## 2022-01-19 NOTE — Telephone Encounter (Signed)
Pt made aware that forms are at front desk.  ?

## 2022-01-22 ENCOUNTER — Encounter: Payer: Self-pay | Admitting: Family Medicine

## 2022-01-23 ENCOUNTER — Other Ambulatory Visit: Payer: Self-pay | Admitting: *Deleted

## 2022-01-23 ENCOUNTER — Encounter: Payer: Self-pay | Admitting: Family Medicine

## 2022-01-23 ENCOUNTER — Telehealth: Payer: Self-pay | Admitting: Family Medicine

## 2022-01-23 DIAGNOSIS — R809 Proteinuria, unspecified: Secondary | ICD-10-CM

## 2022-01-23 NOTE — Telephone Encounter (Signed)
Patient brought in forms to be filled out ? ?Placed in bin up front ?

## 2022-01-23 NOTE — Progress Notes (Signed)
? ?I, Wendy Poet, LAT, ATC, am serving as scribe for Dr. Lynne Leader. ? ?Rhonda Morrow is a 61 y.o. female who presents to [place] [facility] today for shoulder pain.  She was last seen by Dr. [name] on 10/17/21 for hip pain and L Achille's pain and was advised to con't her HEP.  Prior to that she was seen on 04/11/21 for f/u of B shoulder pain, L>R, after having completed 4 PT sessions.  Today, pt reports L shoulder is very painful. Pt was working on Saturday and the L shoulder was really hurting her. She took some meloxicam that helped some. Pt called off work on Sunday due to pain.  She notes some swelling on the superficial aspect of her left shoulder. ? ?Diagnostic testing: R and L shoulder XR- 01/24/21 ? ?Pertinent review of systems: No fevers or chills ? ?Relevant historical information: Hypertension.  Diabetes. ? ? ?Exam:  ?BP (!) 148/88   Pulse 70   Ht '5\' 4"'$  (1.626 m)   Wt 222 lb (100.7 kg)   LMP 05/20/2012   SpO2 99%   BMI 38.11 kg/m?  ?General: Well Developed, well nourished, and in no acute distress.  ? ?MSK: Left shoulder: Slight visible swelling on the superior aspect of the shoulder. ?Palpable nodule superior shoulder with some tenderness in this area. ?Normal shoulder motion pain with abduction present. ?Strength.  Abduction 4/5.  External rotation 4/5 internal rotation 5/5. ?Positive Hawkins and Neer's test.  Positive empty can test. ?Negative Yergason's and speeds. ? ? ?Lab and Radiology Results ? ?Procedure: Real-time Ultrasound Guided Injection of left shoulder ganglion cyst overlying AC joint ?Device: Philips Affiniti 50G ?Images permanently stored and available for review in PACS ?Ultrasound evaluation patient has a large ganglion cyst that overlies the end of the acromion and seems to be associated with the [facility] joint. ?The ganglion cyst measures at about 1 cm diameter and is circular. ?Verbal informed consent obtained.  Discussed risks and benefits of procedure.  Warned about infection bleeding damage to structures skin hypopigmentation and fat atrophy among others. ?Patient expresses understanding and agreement ?Time-out conducted.   ?Noted no overlying erythema, induration, or other signs of local infection.   ?Skin prepped in a sterile fashion.   ?Local anesthesia: Topical Ethyl chloride.   ?With sterile technique and under real time ultrasound guidance: 2 mL of lidocaine injected subcutaneously and into ganglion cyst achieving good anesthesia. ?The skin was again sterilized with isopropyl alcohol and an 18-gauge needle was used to access the ganglion cyst. ?3 mL of clear gelatinous material was aspirated ?The cyst was seen to be decompressed on ultrasound. ?Syringe was exchanged and 40 mg of Kenalog and 1 mL of Marcaine was injected into the cyst. ?Completed without difficulty   ?Pain immediately resolved suggesting accurate placement of the medication.   ?Advised to call if fevers/chills, erythema, induration, drainage, or persistent bleeding.   ?Images permanently stored and available for review in the ultrasound unit.  ?Impression: Technically successful ultrasound guided injection. ? ? ? ? ? ? ? ?Assessment and Plan: ?61 y.o. female with left shoulder pain thought to be due to subacromial bursitis and a large ganglion cyst thought to be affiliated with the [facility] joint.  The ganglion cyst does seem to be bothering her quite a bit so we did aspirate and inject today.  However her shoulder pain may also be caused by the bursitis.  If her shoulder pain is not well controlled she will return  to clinic and we will proceed with a subacromial injection.  Recheck back as needed. ? ?Today's visit conducted using an American sign language interpreter. ?PDMP not reviewed this encounter. ?Orders Placed This Encounter  ?Procedures  ? [place] LIMITED JOINT SPACE STRUCTURES UP BILAT(NO LINKED CHARGES)  ?  Order Specific Question:   Reason for Exam (SYMPTOM  OR DIAGNOSIS REQUIRED)  ?   Answer:   B shoulder pain  ?  Order Specific Question:   Preferred imaging location?  ?  Answer:   [place]  ? ?No orders of the defined types were placed in this encounter. ? ? ? ?Discussed warning signs or symptoms. Please see discharge instructions. Patient expresses understanding. ? ? ?The above documentation has been reviewed and is accurate and complete Lynne Leader, M.D. ? ? ?

## 2022-01-24 ENCOUNTER — Ambulatory Visit (INDEPENDENT_AMBULATORY_CARE_PROVIDER_SITE_OTHER): Payer: Federal, State, Local not specified - PPO | Admitting: Family Medicine

## 2022-01-24 ENCOUNTER — Other Ambulatory Visit: Payer: Self-pay

## 2022-01-24 ENCOUNTER — Ambulatory Visit: Payer: Self-pay

## 2022-01-24 VITALS — BP 148/88 | HR 70 | Ht 64.0 in | Wt 222.0 lb

## 2022-01-24 DIAGNOSIS — G8929 Other chronic pain: Secondary | ICD-10-CM | POA: Diagnosis not present

## 2022-01-24 DIAGNOSIS — M25512 Pain in left shoulder: Secondary | ICD-10-CM

## 2022-01-24 DIAGNOSIS — M6740 Ganglion, unspecified site: Secondary | ICD-10-CM

## 2022-01-24 DIAGNOSIS — M25511 Pain in right shoulder: Secondary | ICD-10-CM | POA: Diagnosis not present

## 2022-01-24 NOTE — Patient Instructions (Addendum)
Thank you for coming in today.  ? ?You received an injection today. Seek immediate medical attention if the joint becomes red, extremely painful, or is oozing fluid.  ? ?Let me know if you are not improving ? ?Recheck back as needed ?

## 2022-01-24 NOTE — Telephone Encounter (Signed)
These are the same forms that we have already filled out. Pt picked up original forms ?

## 2022-01-27 NOTE — Telephone Encounter (Signed)
Pt called back in reference to FMLA , made her aware we received them 01/24/22 , and she stated they are due next week ?

## 2022-01-27 NOTE — Telephone Encounter (Signed)
Pt called. Interrupter left message that FMLA was ready for pick up ?

## 2022-01-31 ENCOUNTER — Other Ambulatory Visit: Payer: Self-pay | Admitting: Family Medicine

## 2022-01-31 DIAGNOSIS — I10 Essential (primary) hypertension: Secondary | ICD-10-CM

## 2022-02-07 ENCOUNTER — Telehealth: Payer: Self-pay | Admitting: Family Medicine

## 2022-02-07 NOTE — Telephone Encounter (Signed)
Patient brought in forms ?Place in bin up front for lowne to fill out ? ? ?

## 2022-02-11 ENCOUNTER — Other Ambulatory Visit: Payer: Self-pay | Admitting: Neurology

## 2022-02-14 ENCOUNTER — Other Ambulatory Visit: Payer: Self-pay | Admitting: Family Medicine

## 2022-02-14 DIAGNOSIS — M79641 Pain in right hand: Secondary | ICD-10-CM

## 2022-02-14 NOTE — Telephone Encounter (Signed)
Forms already completed and given to patient ?

## 2022-02-21 ENCOUNTER — Telehealth: Payer: Self-pay | Admitting: Family Medicine

## 2022-02-21 ENCOUNTER — Ambulatory Visit: Payer: Federal, State, Local not specified - PPO | Admitting: Orthopaedic Surgery

## 2022-02-21 NOTE — Telephone Encounter (Signed)
Referral was placed on 01/23/22  ?

## 2022-02-21 NOTE — Telephone Encounter (Signed)
Patient would like a referral to a kidney doctor. She states that she got labs from here and they advised her that she would need one. Please advise.  ?

## 2022-03-01 ENCOUNTER — Encounter: Payer: Self-pay | Admitting: Orthopaedic Surgery

## 2022-03-01 ENCOUNTER — Ambulatory Visit: Payer: Federal, State, Local not specified - PPO | Admitting: Orthopaedic Surgery

## 2022-03-01 DIAGNOSIS — M1611 Unilateral primary osteoarthritis, right hip: Secondary | ICD-10-CM | POA: Insufficient documentation

## 2022-03-01 NOTE — Progress Notes (Signed)
? ?Office Visit Note ?  ?Patient: Rhonda Morrow           ?Date of Birth: 04-04-1961           ?MRN: [id number] ?Visit Date: 03/01/2022 ?             ?Requested by: Carollee Herter, Kendrick Fries R, DO ?[address] ?[address] ?[place],   [id number] ?PCP: Ann Held, DO ? ? ?Assessment & Plan: ?Visit Diagnoses:  ?1. Primary osteoarthritis of right hip   ? ? ?Plan: Impression is moderate right hip DJD.  Treatment options were explained in detail through use of sign language interpreter.  Questions encouraged and answered.  She will just stick to meloxicam for now.  She will likely call to set up a cortisone injection if her symptoms worsen. ? ?Total face to face encounter time was greater than 25 minutes and over half of this time was spent in counseling and/or coordination of care. ? ?Follow-Up Instructions: No follow-ups on file.  ? ?Orders:  ?No orders of the defined types were placed in this encounter. ? ?No orders of the defined types were placed in this encounter. ? ? ? ? Procedures: ?No procedures performed ? ? ?Clinical Data: ?No additional findings. ? ? ?Subjective: ?Chief Complaint  ?Patient presents with  ? Left Hip - Pain  ? Right Hip - Pain  ? ? ?HPI ? ?Rhonda Morrow is here today for right hip and groin pain.  Sign language interpreter present today.  Meloxicam is effective in relieving the pain.  She has done well from her left hip replacement 5 years ago. ? ?Review of Systems  ?Constitutional: Negative.   ?HENT: Negative.    ?Eyes: Negative.   ?Respiratory: Negative.    ?Cardiovascular: Negative.   ?Endocrine: Negative.   ?Musculoskeletal: Negative.   ?Neurological: Negative.   ?Hematological: Negative.   ?Psychiatric/Behavioral: Negative.    ?All other systems reviewed and are negative. ? ? ?Objective: ?Vital Signs: LMP 05/20/2012  ? ?Physical Exam ?Vitals and nursing note reviewed.  ?Constitutional:   ?   Appearance: She is well-developed.  ?Pulmonary:  ?   Effort: Pulmonary effort is normal.   ?Skin: ?   General: Skin is warm.  ?   Capillary Refill: Capillary refill takes less than 2 seconds.  ?Neurological:  ?   Mental Status: She is alert and oriented to person, place, and time.  ?Psychiatric:     ?   Behavior: Behavior normal.     ?   Thought Content: Thought content normal.     ?   Judgment: Judgment normal.  ? ? ?Ortho Exam ? ?Examination of right hip shows pain with flexion and internal rotation.  Slight limp upon start up from seated position. ? ?Specialty Comments:  ?No specialty comments available. ? ?Imaging: ?No results found. ? ? ?PMFS History: ?Patient Active Problem List  ? Diagnosis Date Noted  ? Primary osteoarthritis of right hip 03/01/2022  ? ACE inhibitor intolerance 01/19/2022  ? Weakness 12/22/2021  ? Left lower quadrant abdominal pain 12/22/2021  ? Slow transit constipation 12/22/2021  ? Class 3 severe obesity due to excess calories without serious comorbidity with body mass index (BMI) of 40.0 to 44.9 in adult [facility]) 07/05/2021  ? Restless leg syndrome 07/05/2021  ? Chronic seasonal allergic rhinitis due to pollen 07/05/2021  ? Psychophysiological insomnia 07/05/2021  ? Non-compliance 07/05/2021  ? COVID-19 07/05/2021  ? Right wrist pain 06/05/2021  ? Acute pain of left  shoulder 01/24/2021  ? Acute pain of right shoulder 01/24/2021  ? Foot pain, bilateral 01/24/2021  ? Dyspepsia 01/24/2021  ? Preventative health care 01/24/2021  ? Right hand pain 12/29/2020  ? OSA (obstructive sleep apnea) 09/12/2020  ? Nocturia more than twice per night 08/03/2020  ? Non-restorative sleep 08/03/2020  ? Leg cramping 08/03/2020  ? Hyperlipidemia associated with type 2 diabetes mellitus ([place]) 07/25/2020  ? Large breasts 07/22/2020  ? RLS (restless legs syndrome) 05/17/2020  ? Cough 04/01/2020  ? Moderate persistent asthma without complication 19/41/7408  ? Medication management 04/01/2020  ? Menopausal symptoms 03/29/2020  ? Hot flashes 03/29/2020  ? Chronic right shoulder pain 08/11/2019  ? Acute  non-recurrent maxillary sinusitis 08/11/2019  ? BMI 40.0-44.9, adult ([place]) 01/22/2019  ? Right lower quadrant abdominal pain 10/20/2018  ? Rectal pain 10/20/2018  ? Vitamin B 12 deficiency 03/28/2018  ? B12 deficiency 03/28/2018  ? DM (diabetes mellitus) type II uncontrolled, periph vascular disorder 03/28/2018  ? Hyperlipidemia 03/28/2018  ? Primary osteoarthritis of left hip 02/21/2017  ? 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 History of laparoscopic cholecystectomy 06/20/2016  ? Cerumen impaction 06/09/2016  ? Hypersomnia 05/16/2016  ? Knee pain, right 05/05/2016  ? RUQ pain 04/09/2016  ? Abnormal CT scan 03/28/2016  ? Dyspnea and respiratory abnormalities 03/15/2016  ? Asthma with acute exacerbation 03/15/2016  ? Asthma in adult 02/25/2016  ? DM (diabetes mellitus) type II controlled, neurological manifestation ([place]) 02/09/2016  ? Left hip pain 12/23/2015  ? Right hamstring muscle strain 11/18/2015  ? Abdominal pain, acute 09/01/2015  ? Acute asthma exacerbation 04/16/2015  ? Acute bronchitis 04/14/2015  ? Pap smear for cervical cancer screening 09/14/2014  ? Bed bug bite 05/06/2014  ? Allergic rhinitis 04/09/2014  ? Rectal itching 04/09/2014  ? Bladder spasm 04/09/2014  ? Knee pain, left 03/05/2014  ? Hypokalemia 02/19/2014  ? Nausea with vomiting 02/19/2014  ? Diabetes mellitus, type II ([place]) 02/19/2014  ? Edema 02/16/2014  ? Disorder of rotator cuff 11/12/2013  ? Routine general medical examination at a health care facility 08/20/2013  ? Gastroesophageal reflux disease 06/26/2013  ? Dysphagia, pharyngoesophageal phase 06/26/2013  ? Medication side effect 03/25/2013  ? Diarrhea 03/25/2013  ? Benign positional vertigo 01/30/2013  ? Traumatic hematoma of thigh 01/15/2013  ? HTN (hypertension) 09/02/2012  ? Dry skin 08/22/2012  ? External hemorrhoids 08/22/2012  ? Obesity 06/30/2012  ? Thyromegaly 05/22/2012  ? Back pain 05/22/2012  ? Elevated glucose 05/22/2012  ? Moderate persistent asthma  04/19/2012  ? Dyspnea 01/28/2012  ? ?Past Medical History:  ?Diagnosis Date  ? Arthritis   ? Asthma   ? Complication of anesthesia   ? one time woke up and was vey scared,17 yrs ago  ? Deaf   ? Diabetes mellitus without complication ([place])   ? GERD (gastroesophageal reflux disease)   ? Hypertension   ? Left groin pain   ? Thyroid disease   ?  ?Family History  ?Problem Relation Age of Onset  ? Diabetes Mother   ? Heart disease Mother   ? Diabetes Father   ?  ?Past Surgical History:  ?Procedure Laterality Date  ? CARDIAC CATHETERIZATION    ? CESAREAN SECTION    ? CHOLECYSTECTOMY N/A 06/20/2016  ? Procedure: LAPAROSCOPIC CHOLECYSTECTOMY;  Surgeon: Rolm Bookbinder, MD;  Location: [place];  Service: General;  Laterality: N/A;  ? ESOPHAGEAL MANOMETRY N/A 09/29/2013  ? Procedure: ESOPHAGEAL MANOMETRY (EM);  Surgeon: Milus Banister, MD;  Location: [facility];  Service: Endoscopy;  Laterality: N/A;  ? TOTAL HIP ARTHROPLASTY Left 02/21/2017  ? Procedure: LEFT TOTAL HIP ARTHROPLASTY ANTERIOR APPROACH;  Surgeon: Leandrew Koyanagi, MD;  Location: [place];  Service: Orthopedics;  Laterality: Left;  ? ?Social History  ? ?Occupational History  ? Occupation: disabled  ?Tobacco Use  ? Smoking status: Never  ? Smokeless tobacco: Never  ?Vaping Use  ? Vaping Use: Never used  ?Substance and Sexual Activity  ? Alcohol use: No  ?  Alcohol/week: 0.0 standard drinks  ? Drug use: No  ? Sexual activity: Never  ? ? ? ? ? ? ?

## 2022-03-04 ENCOUNTER — Other Ambulatory Visit: Payer: Self-pay | Admitting: Family Medicine

## 2022-03-07 ENCOUNTER — Ambulatory Visit (INDEPENDENT_AMBULATORY_CARE_PROVIDER_SITE_OTHER): Payer: Federal, State, Local not specified - PPO | Admitting: Family Medicine

## 2022-03-07 ENCOUNTER — Telehealth: Payer: Self-pay

## 2022-03-07 ENCOUNTER — Other Ambulatory Visit: Payer: Self-pay | Admitting: Family Medicine

## 2022-03-07 VITALS — BP 160/88 | HR 77 | Temp 97.6°F | Resp 12 | Ht 64.0 in | Wt 223.8 lb

## 2022-03-07 DIAGNOSIS — R252 Cramp and spasm: Secondary | ICD-10-CM | POA: Diagnosis not present

## 2022-03-07 DIAGNOSIS — E1151 Type 2 diabetes mellitus with diabetic peripheral angiopathy without gangrene: Secondary | ICD-10-CM

## 2022-03-07 DIAGNOSIS — M62838 Other muscle spasm: Secondary | ICD-10-CM | POA: Diagnosis not present

## 2022-03-07 MED ORDER — CYCLOBENZAPRINE HCL 10 MG PO TABS: 10.0000 mg | ORAL_TABLET | Freq: Three times a day (TID) | ORAL | 0 refills | Status: DC | PRN

## 2022-03-07 NOTE — Telephone Encounter (Signed)
After Hours Call: States she was returning call?  ?

## 2022-03-07 NOTE — Telephone Encounter (Signed)
Pt has appt this afternoon

## 2022-03-07 NOTE — Progress Notes (Signed)
? ?Subjective:  ? ?By signing my name below, I, Zite Okoli, attest that this documentation has been prepared under the direction and in the presence of Ann Held, DO. 03/07/2022 ? ? Patient ID: Rhonda Morrow, female    DOB: 04-05-1961, 61 y.o.   MRN: [id number] ? ?Chief Complaint  ?Patient presents with  ? cramps in both legs into feet  ?  Started on Sunday  ? ? ?HPI ?Patient is in today for an office visit. ? ?She reports that on Sunday while sleeping, she developed "charley horse" cramps in her thighs that spread to the rest of her legs. Her tos were also cramped and she had to walk around for hours to relive the stress.  These episodes occurred yesterday and today with today's episode being worse. The cramps alternate between the right and left leg and radiate to the right side of her back and right abdomen.  She tried yellow mustard and meloxicam but it did not help to resolve the cramps.  ? ?Past Medical History:  ?Diagnosis Date  ? Arthritis   ? Asthma   ? Complication of anesthesia   ? one time woke up and was vey scared,17 yrs ago  ? Deaf   ? Diabetes mellitus without complication ([place])   ? GERD (gastroesophageal reflux disease)   ? Hypertension   ? Left groin pain   ? Thyroid disease   ? ? ?Past Surgical History:  ?Procedure Laterality Date  ? CARDIAC CATHETERIZATION    ? CESAREAN SECTION    ? CHOLECYSTECTOMY N/A 06/20/2016  ? Procedure: LAPAROSCOPIC CHOLECYSTECTOMY;  Surgeon: Rolm Bookbinder, MD;  Location: [place];  Service: General;  Laterality: N/A;  ? ESOPHAGEAL MANOMETRY N/A 09/29/2013  ? Procedure: ESOPHAGEAL MANOMETRY (EM);  Surgeon: Milus Banister, MD;  [facility];  Service: Endoscopy;  Laterality: N/A;  ? TOTAL HIP ARTHROPLASTY Left 02/21/2017  ? Procedure: LEFT TOTAL HIP ARTHROPLASTY ANTERIOR APPROACH;  Surgeon: Leandrew Koyanagi, MD;  Location: [place];  Service: Orthopedics;  Laterality: Left;  ? ? ?Family History  ?Problem Relation Age of Onset  ? Diabetes Mother   ? Heart  disease Mother   ? Diabetes Father   ? ? ?Social History  ? ?Socioeconomic History  ? Marital status: Married  ?  Spouse name: Not on file  ? Number of children: Not on file  ? Years of education: Not on file  ? Highest education level: Not on file  ?Occupational History  ? Occupation: disabled  ?Tobacco Use  ? Smoking status: Never  ? Smokeless tobacco: Never  ?Vaping Use  ? Vaping Use: Never used  ?Substance and Sexual Activity  ? Alcohol use: No  ?  Alcohol/week: 0.0 standard drinks  ? Drug use: No  ? Sexual activity: Never  ?Other Topics Concern  ? Not on file  ?Social History Narrative  ? Lives with family.  Has 3 children.  Works for [employer].  Education: high school.  ? ?Social Determinants of Health  ? ?Financial Resource Strain: Not on file  ?Food Insecurity: Not on file  ?Transportation Needs: Not on file  ?Physical Activity: Not on file  ?Stress: Not on file  ?Social Connections: Not on file  ?Intimate Partner Violence: Not on file  ? ? ?Outpatient Medications Prior to Visit  ?Medication Sig Dispense Refill  ? albuterol (VENTOLIN HFA) 108 (90 Base) MCG/ACT inhaler Inhale 2 puffs into the lungs every 6 (six) hours as needed for wheezing or shortness  of breath. 8 g 6  ? allopurinol (ZYLOPRIM) 100 MG tablet TAKE 1 TABLET BY MOUTH EVERY DAY 90 tablet 3  ? amLODipine (NORVASC) 5 MG tablet TAKE 1 TABLET (5 MG TOTAL) BY MOUTH DAILY. 90 tablet 1  ? blood glucose meter kit and supplies KIT Dispense based on patient and insurance preference. Use up to four times daily as directed. (FOR ICD-9 250.00, 250.01). 1 each 0  ? Budeson-Glycopyrrol-Formoterol (BREZTRI AEROSPHERE) 160-9-4.8 MCG/ACT AERO Inhale 2 puffs into the lungs in the morning and at bedtime. 10.7 g 0  ? cetirizine (ZYRTEC) 10 MG tablet Take 1 tablet (10 mg total) by mouth daily. 30 tablet 11  ? fluticasone (FLONASE) 50 MCG/ACT nasal spray SPRAY 2 SPRAYS INTO EACH NOSTRIL EVERY DAY 48 mL 2  ? meloxicam (MOBIC) 7.5 MG tablet TAKE 1 TO 2 TABLETS BY MOUTH  DAILY AS NEEDED 60 tablet 1  ? metFORMIN (GLUCOPHAGE-XR) 500 MG 24 hr tablet TAKE 2 TABLET BY MOUTH EVERY DAY WITH BREAKFAST 180 tablet 1  ? naphazoline-pheniramine (NAPHCON-A) 0.025-0.3 % ophthalmic solution Place 1 drop into both eyes 4 (four) times daily as needed for eye irritation. 15 mL 0  ? rosuvastatin (CRESTOR) 20 MG tablet TAKE 1 TABLET BY MOUTH EVERYDAY AT BEDTIME 90 tablet 0  ? Budeson-Glycopyrrol-Formoterol (BREZTRI AEROSPHERE) 160-9-4.8 MCG/ACT AERO Inhale 2 puffs into the lungs in the morning and at bedtime. 10.7 g 0  ? ?Facility-Administered Medications Prior to Visit  ?Medication Dose Route Frequency Provider Last Rate Last Admin  ? alum & mag hydroxide-simeth (MAALOX/MYLANTA) 200-200-20 MG/5ML suspension 30 mL  30 mL Oral Once Saguier, Percell Miller, PA-C      ? lidocaine (XYLOCAINE) 2 % viscous mouth solution 15 mL  15 mL Mouth/Throat Once Saguier, Edward, PA-C      ? nitroGLYCERIN (NITRODUR - Dosed in mg/24 hr) patch 0.2 mg  0.2 mg Transdermal Daily Wallene Huh, DPM      ? ? ?Allergies  ?Allergen Reactions  ? Losartan Shortness Of Breath  ? Oxycodone-Acetaminophen Nausea And Vomiting  ? Augmentin [Amoxicillin-Pot Clavulanate] Diarrhea  ? ? ?Review of Systems  ?Constitutional:  Negative for fever.  ?HENT:  Negative for congestion, ear pain, hearing loss, sinus pain and sore throat.   ?Eyes:  Negative for blurred vision and pain.  ?Respiratory:  Negative for cough, sputum production, shortness of breath and wheezing.   ?Cardiovascular:  Negative for chest pain and palpitations.  ?Gastrointestinal:  Negative for blood in stool, constipation, diarrhea, nausea and vomiting.  ?Genitourinary:  Negative for dysuria, frequency, hematuria and urgency.  ?Musculoskeletal:  Positive for back pain and myalgias. Negative for falls.  ?     (+) cramps  ?Neurological:  Negative for dizziness, sensory change, loss of consciousness, weakness and headaches.  ?Endo/Heme/Allergies:  Negative for environmental allergies.  Does not bruise/bleed easily.  ?Psychiatric/Behavioral:  Negative for depression and suicidal ideas. The patient is not nervous/anxious and does not have insomnia.   ? ?   ?Objective:  ?  ?Physical Exam ?Constitutional:   ?   General: She is not in acute distress. ?   Appearance: Normal appearance. She is not ill-appearing.  ?HENT:  ?   Head: Normocephalic and atraumatic.  ?   Right Ear: External ear normal.  ?   Left Ear: External ear normal.  ?Eyes:  ?   Extraocular Movements: Extraocular movements intact.  ?   Pupils: Pupils are equal, round, and reactive to light.  ?Cardiovascular:  ?   Rate and Rhythm:  Normal rate and regular rhythm.  ?   Pulses: Normal pulses.  ?   Heart sounds: Normal heart sounds. No murmur heard. ?  No gallop.  ?Pulmonary:  ?   Effort: Pulmonary effort is normal. No respiratory distress.  ?   Breath sounds: Normal breath sounds. No wheezing, rhonchi or rales.  ?Abdominal:  ?   General: Bowel sounds are normal. There is no distension.  ?   Palpations: Abdomen is soft. There is no mass.  ?   Tenderness: There is no abdominal tenderness. There is no guarding or rebound.  ?   Hernia: No hernia is present.  ?Musculoskeletal:  ?   Cervical back: Normal range of motion and neck supple.  ?   Comments: Lower legs non-tender on palpation.  ?Lymphadenopathy:  ?   Cervical: No cervical adenopathy.  ?Skin: ?   General: Skin is warm and dry.  ?Neurological:  ?   Mental Status: She is alert and oriented to person, place, and time.  ?Psychiatric:     ?   Behavior: Behavior normal.  ? ? ?BP (!) 160/88 (BP Location: Left Arm, Cuff Size: Large)   Pulse 77   Temp 97.6 ?F (36.4 ?C) (Oral)   Resp 12   Ht 5' 4"  (1.626 m)   Wt 223 lb 12.8 oz (101.5 kg)   LMP 05/20/2012   SpO2 97%   BMI 38.42 kg/m?  ?Wt Readings from Last 3 Encounters:  ?03/07/22 223 lb 12.8 oz (101.5 kg)  ?01/24/22 222 lb (100.7 kg)  ?01/17/22 220 lb 12.8 oz (100.2 kg)  ? ? ?Diabetic Foot Exam - Simple   ?No data filed ?  ? ?Lab Results   ?Component Value Date  ? WBC 7.8 01/17/2022  ? HGB 14.2 01/17/2022  ? HCT 42.8 01/17/2022  ? PLT 180.0 01/17/2022  ? GLUCOSE 97 03/07/2022  ? CHOL 176 12/05/2021  ? TRIG 85.0 12/05/2021  ? HDL 55.10 12/05/2021  ? LDL

## 2022-03-07 NOTE — Patient Instructions (Addendum)
[place] for leg cramp ? ? ? ?Leg Cramps ?Leg cramps occur when one or more muscles tighten and a person has no control over it (involuntary muscle contraction). Muscle cramps are most common in the calf muscles of the leg. They can occur during exercise or at rest. Leg cramps are painful, and they may last for a few seconds to a few minutes. Cramps may return several times before they finally stop. ?Usually, leg cramps are not caused by a serious medical problem. In many cases, the cause is not known. Some common causes include: ?Excessive physical effort (overexertion), such as during intense exercise. ?Doing the same motion over and over. ?Staying in a certain position for a long period of time. ?Improper preparation, form, or technique while doing a sport or an activity. ?Dehydration. ?Injury. ?Side effects of certain medicines. ?Abnormally low levels of minerals in your blood (electrolytes), especially potassium and calcium. This could result from: ?Pregnancy. ?Taking diuretic medicines. ?Follow these instructions at home: ?Eating and drinking ?Drink enough fluid to keep your urine pale yellow. Staying hydrated may help prevent cramps. ?Eat a healthy diet that includes plenty of nutrients to help your muscles function. A healthy diet includes fruits and vegetables, lean protein, whole grains, and low-fat or nonfat dairy products. ?Managing pain, stiffness, and swelling ? ?  ? ?Try massaging, stretching, and relaxing the affected muscle. Do this for several minutes at a time. ?If directed, put ice on areas that are sore or painful after a cramp. To do this: ?Put ice in a plastic bag. ?Place a towel between your skin and the bag. ?Leave the ice on for 20 minutes, 2-3 times a day. ?Remove the ice if your skin turns bright red. This is very important. If you cannot feel pain, heat, or cold, you have a greater risk of damage to the area. ?If directed, apply heat to muscles that are tense or tight. Do this before you  exercise, or as often as told by your health care provider. Use the heat source that your health care provider recommends, such as a moist heat pack or a heating pad. To do this: ?Place a towel between your skin and the heat source. ?Leave the heat on for 20-30 minutes. ?Remove the heat if your skin turns bright red. This is especially important if you are unable to feel pain, heat, or cold. You may have a greater risk of getting burned. ?Try taking hot showers or baths to help relax tight muscles. ?General instructions ?If you are having frequent leg cramps, avoid intense exercise for several days. ?Take over-the-counter and prescription medicines only as told by your health care provider. ?Keep all follow-up visits. This is important. ?Contact a health care provider if: ?Your leg cramps get more severe or more frequent, or they do not improve over time. ?Your foot becomes cold, numb, or blue. ?Summary ?Muscle cramps can develop in any muscle, but the most common place is in the calf muscles of the leg. ?Leg cramps are painful, and they may last for a few seconds to a few minutes. ?Usually, leg cramps are not caused by a serious medical problem. Often, the cause is not known. ?Stay hydrated, and take over-the-counter and prescription medicines only as told by your health care provider. ?This information is not intended to replace advice given to you by your health care provider. Make sure you discuss any questions you have with your health care provider. ?Document Revised: 03/10/2020 Document Reviewed: 03/10/2020 ?Elsevier Patient Education ?  [place]. ? ?

## 2022-03-08 DIAGNOSIS — M62838 Other muscle spasm: Secondary | ICD-10-CM | POA: Insufficient documentation

## 2022-03-08 DIAGNOSIS — R252 Cramp and spasm: Secondary | ICD-10-CM | POA: Insufficient documentation

## 2022-03-08 LAB — COMPREHENSIVE METABOLIC PANEL
ALT: 17 U/L (ref 0–35)
AST: 19 U/L (ref 0–37)
Albumin: 3.9 g/dL (ref 3.5–5.2)
Alkaline Phosphatase: 77 U/L (ref 39–117)
BUN: 17 mg/dL (ref 6–23)
CO2: 30 mEq/L (ref 19–32)
Calcium: 9.7 mg/dL (ref 8.4–10.5)
Chloride: 103 mEq/L (ref 96–112)
Creatinine, Ser: 1.07 mg/dL (ref 0.40–1.20)
GFR: 57.26 mL/min — ABNORMAL LOW (ref >60.00)
Glucose, Bld: 97 mg/dL (ref 70–99)
Potassium: 3.7 mEq/L (ref 3.5–5.1)
Sodium: 142 mEq/L (ref 135–145)
Total Bilirubin: 0.4 mg/dL (ref 0.2–1.2)
Total Protein: 7.2 g/dL (ref 6.0–8.3)

## 2022-03-08 LAB — MAGNESIUM: Magnesium: 1.8 mg/dL (ref 1.5–2.5)

## 2022-03-08 LAB — PHOSPHORUS: Phosphorus: 3 mg/dL (ref 2.3–4.6)

## 2022-03-08 NOTE — Assessment & Plan Note (Signed)
Pt has tried mustard. Drinking more water  ?D/w pt making sure she is taking her mag and ca  ?Try tonic water , hylands for leg cramps  ?Check labs today ?Muscle relaxer  ?

## 2022-03-09 ENCOUNTER — Other Ambulatory Visit: Payer: Self-pay | Admitting: Neurology

## 2022-03-09 DIAGNOSIS — J301 Allergic rhinitis due to pollen: Secondary | ICD-10-CM

## 2022-03-10 ENCOUNTER — Ambulatory Visit (INDEPENDENT_AMBULATORY_CARE_PROVIDER_SITE_OTHER): Payer: Federal, State, Local not specified - PPO | Admitting: Family

## 2022-03-10 ENCOUNTER — Other Ambulatory Visit: Payer: Self-pay | Admitting: Family

## 2022-03-10 ENCOUNTER — Encounter: Payer: Self-pay | Admitting: Family

## 2022-03-10 VITALS — BP 118/80 | HR 78 | Temp 97.9°F | Ht 64.0 in | Wt 219.4 lb

## 2022-03-10 DIAGNOSIS — J029 Acute pharyngitis, unspecified: Secondary | ICD-10-CM | POA: Diagnosis not present

## 2022-03-10 LAB — POCT RAPID STREP A (OFFICE): Rapid Strep A Screen: NEGATIVE

## 2022-03-10 MED ORDER — AZELASTINE HCL 0.1 % NA SOLN: 2.0000 | Freq: Two times a day (BID) | NASAL | 3 refills | Status: DC

## 2022-03-10 NOTE — Progress Notes (Signed)
?Rhonda Morrow is a 61 y.o. female with the following history as recorded in EpicCare:  ?Patient Active Problem List  ? Diagnosis Date Noted  ? Leg cramps 03/08/2022  ? Muscle spasm of right lower extremity 03/08/2022  ? Primary osteoarthritis of right hip 03/01/2022  ? ACE inhibitor intolerance 01/19/2022  ? Weakness 12/22/2021  ? Left lower quadrant abdominal pain 12/22/2021  ? Slow transit constipation 12/22/2021  ? Class 3 severe obesity due to excess calories without serious comorbidity with body mass index (BMI) of 40.0 to 44.9 in adult [facility]) 07/05/2021  ? Restless leg syndrome 07/05/2021  ? Chronic seasonal allergic rhinitis due to pollen 07/05/2021  ? Psychophysiological insomnia 07/05/2021  ? Non-compliance 07/05/2021  ? COVID-19 07/05/2021  ? Right wrist pain 06/05/2021  ? Acute pain of left shoulder 01/24/2021  ? Acute pain of right shoulder 01/24/2021  ? Foot pain, bilateral 01/24/2021  ? Dyspepsia 01/24/2021  ? Preventative health care 01/24/2021  ? Right hand pain 12/29/2020  ? OSA (obstructive sleep apnea) 09/12/2020  ? Nocturia more than twice per night 08/03/2020  ? Non-restorative sleep 08/03/2020  ? Leg cramping 08/03/2020  ? Hyperlipidemia associated with type 2 diabetes mellitus ([place]) 07/25/2020  ? Large breasts 07/22/2020  ? RLS (restless legs syndrome) 05/17/2020  ? Cough 04/01/2020  ? Moderate persistent asthma without complication 09/62/8366  ? Medication management 04/01/2020  ? Menopausal symptoms 03/29/2020  ? Hot flashes 03/29/2020  ? Chronic right shoulder pain 08/11/2019  ? Acute non-recurrent maxillary sinusitis 08/11/2019  ? BMI 40.0-44.9, adult ([place]) 01/22/2019  ? Right lower quadrant abdominal pain 10/20/2018  ? Rectal pain 10/20/2018  ? Vitamin B 12 deficiency 03/28/2018  ? B12 deficiency 03/28/2018  ? DM (diabetes mellitus) type II uncontrolled, periph vascular disorder 03/28/2018  ? Hyperlipidemia 03/28/2018  ? Primary osteoarthritis of left hip 02/21/2017  ? Status post total  hip replacement, left 02/21/2017  ? History of laparoscopic cholecystectomy 06/20/2016  ? Cerumen impaction 06/09/2016  ? Hypersomnia 05/16/2016  ? Knee pain, right 05/05/2016  ? RUQ pain 04/09/2016  ? Abnormal CT scan 03/28/2016  ? Dyspnea and respiratory abnormalities 03/15/2016  ? Asthma with acute exacerbation 03/15/2016  ? Asthma in adult 02/25/2016  ? DM (diabetes mellitus) type II controlled, neurological manifestation ([place]) 02/09/2016  ? Left hip pain 12/23/2015  ? Right hamstring muscle strain 11/18/2015  ? Abdominal pain, acute 09/01/2015  ? Acute asthma exacerbation 04/16/2015  ? Acute bronchitis 04/14/2015  ? Pap smear for cervical cancer screening 09/14/2014  ? Bed bug bite 05/06/2014  ? Allergic rhinitis 04/09/2014  ? Rectal itching 04/09/2014  ? Bladder spasm 04/09/2014  ? Knee pain, left 03/05/2014  ? Hypokalemia 02/19/2014  ? Nausea with vomiting 02/19/2014  ? Diabetes mellitus, type II ([place]) 02/19/2014  ? Edema 02/16/2014  ? Disorder of rotator cuff 11/12/2013  ? Routine general medical examination at a health care facility 08/20/2013  ? Gastroesophageal reflux disease 06/26/2013  ? Dysphagia, pharyngoesophageal phase 06/26/2013  ? Medication side effect 03/25/2013  ? Diarrhea 03/25/2013  ? Benign positional vertigo 01/30/2013  ? Traumatic hematoma of thigh 01/15/2013  ? HTN (hypertension) 09/02/2012  ? Dry skin 08/22/2012  ? External hemorrhoids 08/22/2012  ? Obesity 06/30/2012  ? Thyromegaly 05/22/2012  ? Back pain 05/22/2012  ? Elevated glucose 05/22/2012  ? Moderate persistent asthma 04/19/2012  ? Dyspnea 01/28/2012  ?  ?Current Outpatient Medications  ?Medication Sig Dispense Refill  ? albuterol (VENTOLIN HFA) 108 (90 Base) MCG/ACT inhaler Inhale 2  puffs into the lungs every 6 (six) hours as needed for wheezing or shortness of breath. 8 g 6  ? allopurinol (ZYLOPRIM) 100 MG tablet TAKE 1 TABLET BY MOUTH EVERY DAY 90 tablet 3  ? amLODipine (NORVASC) 5 MG tablet TAKE 1 TABLET (5 MG TOTAL) BY  MOUTH DAILY. 90 tablet 1  ? blood glucose meter kit and supplies KIT Dispense based on patient and insurance preference. Use up to four times daily as directed. (FOR ICD-9 250.00, 250.01). 1 each 0  ? Budeson-Glycopyrrol-Formoterol (BREZTRI AEROSPHERE) 160-9-4.8 MCG/ACT AERO Inhale 2 puffs into the lungs in the morning and at bedtime. 10.7 g 0  ? cetirizine (ZYRTEC) 10 MG tablet Take 1 tablet (10 mg total) by mouth daily. 30 tablet 11  ? cyclobenzaprine (FLEXERIL) 10 MG tablet Take 1 tablet (10 mg total) by mouth 3 (three) times daily as needed for muscle spasms. 30 tablet 0  ? meloxicam (MOBIC) 7.5 MG tablet TAKE 1 TO 2 TABLETS BY MOUTH DAILY AS NEEDED 60 tablet 1  ? metFORMIN (GLUCOPHAGE-XR) 500 MG 24 hr tablet TAKE 2 TABLET BY MOUTH EVERY DAY WITH BREAKFAST 180 tablet 1  ? rosuvastatin (CRESTOR) 20 MG tablet TAKE 1 TABLET BY MOUTH EVERYDAY AT BEDTIME 90 tablet 0  ? azelastine (ASTELIN) 0.1 % nasal spray Place 2 sprays into both nostrils 2 (two) times daily. Use in each nostril as directed 30 mL 3  ? fluticasone (FLONASE) 50 MCG/ACT nasal spray SPRAY 2 SPRAYS INTO EACH NOSTRIL EVERY DAY (Patient not taking: Reported on 03/10/2022) 48 mL 2  ? naphazoline-pheniramine (NAPHCON-A) 0.025-0.3 % ophthalmic solution Place 1 drop into both eyes 4 (four) times daily as needed for eye irritation. (Patient not taking: Reported on 03/10/2022) 15 mL 0  ? ?Current Facility-Administered Medications  ?Medication Dose Route Frequency Provider Last Rate Last Admin  ? alum & mag hydroxide-simeth (MAALOX/MYLANTA) 200-200-20 MG/5ML suspension 30 mL  30 mL Oral Once Saguier, Percell Miller, PA-C      ? lidocaine (XYLOCAINE) 2 % viscous mouth solution 15 mL  15 mL Mouth/Throat Once Saguier, Edward, PA-C      ? nitroGLYCERIN (NITRODUR - Dosed in mg/24 hr) patch 0.2 mg  0.2 mg Transdermal Daily Wallene Huh, DPM      ?  ?Allergies: Losartan, Oxycodone-acetaminophen, and Augmentin [amoxicillin-pot clavulanate]  ?Past Medical History:  ?Diagnosis  Date  ? Arthritis   ? Asthma   ? Complication of anesthesia   ? one time woke up and was vey scared,17 yrs ago  ? Deaf   ? Diabetes mellitus without complication ([place])   ? GERD (gastroesophageal reflux disease)   ? Hypertension   ? Left groin pain   ? Thyroid disease   ?  ?Past Surgical History:  ?Procedure Laterality Date  ? CARDIAC CATHETERIZATION    ? CESAREAN SECTION    ? CHOLECYSTECTOMY N/A 06/20/2016  ? Procedure: LAPAROSCOPIC CHOLECYSTECTOMY;  Surgeon: Rolm Bookbinder, MD;  Location: [place];  Service: General;  Laterality: N/A;  ? ESOPHAGEAL MANOMETRY N/A 09/29/2013  ? Procedure: ESOPHAGEAL MANOMETRY (EM);  Surgeon: Milus Banister, MD;  Location: [facility];  Service: Endoscopy;  Laterality: N/A;  ? TOTAL HIP ARTHROPLASTY Left 02/21/2017  ? Procedure: LEFT TOTAL HIP ARTHROPLASTY ANTERIOR APPROACH;  Surgeon: Leandrew Koyanagi, MD;  Location: [place];  Service: Orthopedics;  Laterality: Left;  ?  ?Family History  ?Problem Relation Age of Onset  ? Diabetes Mother   ? Heart disease Mother   ? Diabetes Father   ?  ?  Social History  ? ?Tobacco Use  ? Smoking status: Never  ? Smokeless tobacco: Never  ?Substance Use Topics  ? Alcohol use: No  ?  Alcohol/week: 0.0 standard drinks  ?  ?Subjective:  ? ?Visit conducted with assistance of sign language interpreter; ?Sore throat x 2 days; "feels dry, irritated." OTC Motrin with some benefit; no fever;  ? ? ?Objective:  ?Vitals:  ? 03/10/22 1133  ?BP: 118/80  ?Pulse: 78  ?Temp: 97.9 ?F (36.6 ?C)  ?TempSrc: Oral  ?SpO2: 99%  ?Weight: 219 lb 6.4 oz (99.5 kg)  ?Height: 5' 4" (1.626 m)  ?  ?General: Well developed, well nourished, in no acute distress  ?Skin : Warm and dry.  ?Head: Normocephalic and atraumatic  ?Eyes: Sclera and conjunctiva clear; pupils round and reactive to light; extraocular movements intact  ?Ears: External normal; canals clear; tympanic membranes normal  ?Oropharynx: Pink, supple. No suspicious lesions  ?Neck: Supple without thyromegaly, adenopathy  ?Lungs:  Respirations unlabored;  ?Neurologic: Alert and oriented; speech intact; face symmetrical; moves all extremities well; CNII-XII intact without focal deficit  ? ?Assessment:  ?1. Sore throat   ?  ?Plan:  ?Rapid

## 2022-03-23 ENCOUNTER — Encounter: Payer: Self-pay | Admitting: Family Medicine

## 2022-03-27 ENCOUNTER — Other Ambulatory Visit: Payer: Self-pay | Admitting: Family Medicine

## 2022-03-27 ENCOUNTER — Telehealth: Payer: Self-pay | Admitting: Pharmacist

## 2022-03-27 DIAGNOSIS — H5713 Ocular pain, bilateral: Secondary | ICD-10-CM

## 2022-03-27 DIAGNOSIS — E1169 Type 2 diabetes mellitus with other specified complication: Secondary | ICD-10-CM

## 2022-03-27 NOTE — Telephone Encounter (Signed)
Patient was in the office with her husband today and asked about referral for diabetic eye exam / eye pain.  It looks like patient was referred to [facility] in January 2023 but she has not received call for appointment. He husband is going to [facility] : [phone] and she would like go to the same office.  Will request new referral from PCP.

## 2022-04-01 ENCOUNTER — Other Ambulatory Visit: Payer: Self-pay | Admitting: Family Medicine

## 2022-04-01 DIAGNOSIS — R1013 Epigastric pain: Secondary | ICD-10-CM

## 2022-04-11 DIAGNOSIS — R8290 Unspecified abnormal findings in urine: Secondary | ICD-10-CM | POA: Diagnosis not present

## 2022-04-11 DIAGNOSIS — I129 Hypertensive chronic kidney disease with stage 1 through stage 4 chronic kidney disease, or unspecified chronic kidney disease: Secondary | ICD-10-CM | POA: Diagnosis not present

## 2022-04-11 LAB — COMPREHENSIVE METABOLIC PANEL
Albumin: 4 (ref 3.5–5.0)
Calcium: 10.1 (ref 8.7–10.7)
eGFR: 60

## 2022-04-11 LAB — BASIC METABOLIC PANEL
BUN: 17 (ref 4–21)
CO2: 31 — AB (ref 13–22)
Chloride: 100 (ref 99–108)
Creatinine: 1.1 (ref 0.5–1.1)
Glucose: 123
Potassium: 3.6 mEq/L (ref 3.5–5.1)
Sodium: 139 (ref 137–147)

## 2022-04-11 LAB — PROTEIN / CREATININE RATIO, URINE
Albumin, U: 42.4
Creatinine, Urine: 432.2

## 2022-04-12 LAB — MICROALBUMIN / CREATININE URINE RATIO: Microalb Creat Ratio: 10

## 2022-04-19 ENCOUNTER — Encounter: Payer: Self-pay | Admitting: *Deleted

## 2022-05-22 ENCOUNTER — Ambulatory Visit (INDEPENDENT_AMBULATORY_CARE_PROVIDER_SITE_OTHER): Payer: Federal, State, Local not specified - PPO | Admitting: Nurse Practitioner

## 2022-05-22 ENCOUNTER — Encounter: Payer: Self-pay | Admitting: Nurse Practitioner

## 2022-05-22 VITALS — BP 128/80 | HR 85 | Temp 97.9°F | Ht 62.0 in | Wt 219.0 lb

## 2022-05-22 DIAGNOSIS — J4540 Moderate persistent asthma, uncomplicated: Secondary | ICD-10-CM | POA: Diagnosis not present

## 2022-05-22 DIAGNOSIS — J301 Allergic rhinitis due to pollen: Secondary | ICD-10-CM

## 2022-05-22 MED ORDER — BREZTRI AEROSPHERE 160-9-4.8 MCG/ACT IN AERO: 2.0000 | INHALATION_SPRAY | Freq: Two times a day (BID) | RESPIRATORY_TRACT | 0 refills | Status: DC

## 2022-05-22 MED ORDER — BREZTRI AEROSPHERE 160-9-4.8 MCG/ACT IN AERO: 2.0000 | INHALATION_SPRAY | Freq: Two times a day (BID) | RESPIRATORY_TRACT | 11 refills | Status: DC

## 2022-05-22 NOTE — Assessment & Plan Note (Signed)
Well-controlled on Zyrtec and astelin nasal spray.

## 2022-05-22 NOTE — Progress Notes (Signed)
@Patient  ID: Rhonda Morrow, female    DOB: 19-May-1961, 61 y.o.   MRN: [id number]  Chief Complaint  Patient presents with   Follow-up    Patient has no complaints.     Referring provider: Carollee Herter, Alferd Apa, *  HPI: 61 year old female, never smoker followed for asthma.  She is a patient of Dr. Juline Patch and last seen in office 01/11/2022.  Past medical history significant for HTN, DM II, allergic rhinitis, OSA, GERD, vertigo, obesity, HLD, RLS.   TEST/EVENTS:  01/22/2019 PFTs: FVC 61, FEV1 69, ratio 88, TLC 65, DLCOcor 87  01/11/2022: OV with Dr. Valeta Harms for follow-up regarding mild intermittent asthma symptoms.  Overall doing well.  Still has some occasional shortness of breath but she has been intermittently using her Symbicort.  Ran out of it the other day.  Having issues with cost of inhaler.  She was provided with samples of Breztri and a new prescription and $0 co-pay card.  Encouraged to continue participation in her weight loss program.  05/22/2022: Today - acute Patient presents today for intended acute visit with ALS interpretor. She reports that last weekend, she had felt really short of breath when she was at work and had some associated chest tightness. She had run out of her Breztri and albuterol inhalers and had been without them for a few days. She had to sit in front of the fan, which helped her breathing and she called the following week to schedule an appointment. Since then, she feels like she has been doing better. She still has some occasional shortness of breath, mainly at work, and wheezing. Denies cough, leg edema, fevers, palpitations. She felt great when she was on the [place] and would like to get started back on it.   Allergies  Allergen Reactions   Losartan Shortness Of Breath   Oxycodone-Acetaminophen Nausea And Vomiting   Augmentin [Amoxicillin-Pot Clavulanate] Diarrhea    Immunization History  Administered Date(s) Administered   DT (Pediatric) 04/07/2011    Influenza Split 08/07/2011, 08/05/2012, 08/22/2012, 08/31/2014   Influenza,inj,Quad PF,6+ Mos 08/20/2013, 10/22/2015, 08/01/2016, 09/11/2017, 08/06/2018, 07/15/2019, 07/22/2020, 07/18/2021   Influenza,trivalent, recombinat, inj, PF 12/18/2011   PFIZER(Purple Top)SARS-COV-2 Vaccination 12/28/2019, 01/20/2020, 08/23/2020   Pfizer Covid-19 Vaccine Bivalent Booster 80yr & up 10/11/2021   Pneumococcal Polysaccharide-23 10/02/2012, 07/22/2020   Tdap 12/18/2011   Zoster Recombinat (Shingrix) 08/17/2020, 10/19/2020    Past Medical History:  Diagnosis Date   Arthritis    Asthma    Complication of anesthesia    one time woke up and was vey scared,17 yrs ago   Deaf    Diabetes mellitus without complication ([place]    GERD (gastroesophageal reflux disease)    Hypertension    Left groin pain    Thyroid disease     Tobacco History: Social History   Tobacco Use  Smoking Status Never  Smokeless Tobacco Never   Counseling given: Not Answered   Outpatient Medications Prior to Visit  Medication Sig Dispense Refill   albuterol (VENTOLIN HFA) 108 (90 Base) MCG/ACT inhaler Inhale 2 puffs into the lungs every 6 (six) hours as needed for wheezing or shortness of breath. 8 g 6   allopurinol (ZYLOPRIM) 100 MG tablet TAKE 1 TABLET BY MOUTH EVERY DAY 90 tablet 3   amLODipine (NORVASC) 5 MG tablet TAKE 1 TABLET (5 MG TOTAL) BY MOUTH DAILY. 90 tablet 1   azelastine (ASTELIN) 0.1 % nasal spray Place 2 sprays into both nostrils 2 (two) times daily. Use in each  nostril as directed 30 mL 3   blood glucose meter kit and supplies KIT Dispense based on patient and insurance preference. Use up to four times daily as directed. (FOR ICD-9 250.00, 250.01). 1 each 0   cetirizine (ZYRTEC) 10 MG tablet Take 1 tablet (10 mg total) by mouth daily. 30 tablet 11   cyclobenzaprine (FLEXERIL) 10 MG tablet Take 1 tablet (10 mg total) by mouth 3 (three) times daily as needed for muscle spasms. 30 tablet 0   meloxicam (MOBIC)  7.5 MG tablet TAKE 1 TO 2 TABLETS BY MOUTH DAILY AS NEEDED 60 tablet 1   metFORMIN (GLUCOPHAGE-XR) 500 MG 24 hr tablet TAKE 2 TABLET BY MOUTH EVERY DAY WITH BREAKFAST 180 tablet 1   rosuvastatin (CRESTOR) 20 MG tablet TAKE 1 TABLET BY MOUTH EVERYDAY AT BEDTIME 90 tablet 0   Budeson-Glycopyrrol-Formoterol (BREZTRI AEROSPHERE) 160-9-4.8 MCG/ACT AERO Inhale 2 puffs into the lungs in the morning and at bedtime. 10.7 g 0   Facility-Administered Medications Prior to Visit  Medication Dose Route Frequency Provider Last Rate Last Admin   alum & mag hydroxide-simeth (MAALOX/MYLANTA) 200-200-20 MG/5ML suspension 30 mL  30 mL Oral Once Saguier, Percell Miller, PA-C       lidocaine (XYLOCAINE) 2 % viscous mouth solution 15 mL  15 mL Mouth/Throat Once Saguier, Edward, PA-C       nitroGLYCERIN (NITRODUR - Dosed in mg/24 hr) patch 0.2 mg  0.2 mg Transdermal Daily Wallene Huh, DPM         Review of Systems:   Constitutional: No weight loss or gain, night sweats, fevers, chills, fatigue, or lassitude. HEENT: No headaches, difficulty swallowing, tooth/dental problems, or sore throat. No sneezing, itching, ear ache, nasal congestion, or post nasal drip CV:  No chest pain, orthopnea, PND, swelling in lower extremities, anasarca, dizziness, palpitations, syncope Resp: +shortness of breath with exertion (improved today); occasional wheeze. No excess mucus or change in color of mucus. No productive or non-productive. No hemoptysis. No wheezing.  No chest wall deformity Skin: No rash, lesions, ulcerations MSK:  No joint pain or swelling.  No decreased range of motion.  No back pain. Neuro: No dizziness or lightheadedness.  Psych: No depression or anxiety. Mood stable.     Physical Exam:  BP 128/80 (BP Location: Right Arm, Patient Position: Sitting, Cuff Size: Large)   Pulse 85   Temp 97.9 F (36.6 C) (Oral)   Ht 5' 2"  (1.575 m)   Wt 219 lb (99.3 kg)   LMP 05/20/2012   SpO2 98%   BMI 40.06 kg/m   GEN:  Pleasant, interactive, well-appearing; obese; in no acute distress. HEENT:  Normocephalic and atraumatic. PERRLA. Sclera white. Nasal turbinates pink, moist and patent bilaterally. No rhinorrhea present. Oropharynx pink and moist, without exudate or edema. No lesions, ulcerations, or postnasal drip.  NECK:  Supple w/ fair ROM. No JVD present.  CV: RRR, no m/r/g, no peripheral edema. Pulses intact, +2 bilaterally. No cyanosis, pallor or clubbing. PULMONARY:  Unlabored, regular breathing. Clear bilaterally A&P w/o wheezes/rales/rhonchi. No accessory muscle use. No dullness to percussion. GI: BS present and normoactive. Soft, non-tender to palpation. No organomegaly or masses detected. No CVA tenderness. MSK: No erythema, warmth or tenderness. Cap refil <2 sec all extrem. No deformities or joint swelling noted.  Neuro: A/Ox3. No focal deficits noted.   Skin: Warm, no lesions or rashe Psych: Normal affect and behavior. Judgement and thought content appropriate.     Lab Results:  CBC    Component Value  Date/Time   WBC 7.8 01/17/2022 1452   RBC 5.15 (H) 01/17/2022 1452   HGB 14.2 01/17/2022 1452   HCT 42.8 01/17/2022 1452   PLT 180.0 01/17/2022 1452   MCV 83.2 01/17/2022 1452   MCH 27.8 05/24/2020 0540   MCHC 33.0 01/17/2022 1452   RDW 14.2 01/17/2022 1452   LYMPHSABS 1.8 01/17/2022 1452   MONOABS 0.6 01/17/2022 1452   EOSABS 0.1 01/17/2022 1452   BASOSABS 0.1 01/17/2022 1452    BMET    Component Value Date/Time   NA 139 04/11/2022 0000   K 3.6 04/11/2022 0000   CL 100 04/11/2022 0000   CO2 31 (A) 04/11/2022 0000   GLUCOSE 97 03/07/2022 1812   BUN 17 04/11/2022 0000   CREATININE 1.1 04/11/2022 0000   CREATININE 1.07 03/07/2022 1812   CREATININE 1.06 (H) 12/22/2021 1552   CALCIUM 10.1 04/11/2022 0000   GFRNONAA 43 (L) 05/24/2020 0540   GFRAA 49 (L) 05/24/2020 0540    BNP    Component Value Date/Time   BNP 25.3 09/01/2015 1000     Imaging:  No results  found.       Latest Ref Rng & Units 01/22/2019    2:35 PM 07/21/2013   12:03 PM  PFT Results  FVC-Pre L 1.60    FVC-Predicted Pre % 61  56      FVC-Post L 1.66    FVC-Predicted Post % 63    Pre FEV1/FVC % % 89  89      Post FEV1/FCV % % 88    FEV1-Pre L 1.42  1.37      FEV1-Predicted Pre % 69  62      FEV1-Post L 1.45    DLCO uncorrected ml/min/mmHg 17.81    DLCO UNC% % 91    DLCO corrected ml/min/mmHg 17.03    DLCO COR %Predicted % 87    DLVA Predicted % 148    TLC L 3.13    TLC % Predicted % 65    RV % Predicted % 65       This result is from an external source.    No results found for: "NITRICOXIDE"      Assessment & Plan:   Asthma, moderate persistent, poorly-controlled Previously well-controlled with good benefit from [place]. She has been out of both of her inhalers for a few weeks now. Had a bout of increased SOB and wheezing a few weeks ago; better now but felt better on maintenance inhaler. No evidence of acute exacerbation today and lung exam clear. We will restart her on Breztri - provided with samples and rx sent. If this is too costly, advised her to notify so we can look at alternative options. She has refills available on her albuterol, which I informed her of. She is going to get this refilled today to keep on hand.   Patient Instructions  Restart Breztri 2 puffs Twice daily. Brush tongue and rinse mouth afterwards Continue Albuterol inhaler 2 puffs every 6 hours as needed for shortness of breath or wheezing. Notify if symptoms persist despite rescue inhaler/neb use. Continue Zyrtec 1 tab daily for allergies  Follow up in 6 months with Dr. Valeta Harms. If symptoms do not improve or worsen, please contact office for sooner follow up or seek emergency care.     Allergic rhinitis Well-controlled on Zyrtec and astelin nasal spray.   I spent 32 minutes of dedicated to the care of this patient on the date of this encounter to include pre-visit  review of records,  face-to-face time with the patient discussing conditions above, post visit ordering of testing, clinical documentation with the electronic health record, making appropriate referrals as documented, and communicating necessary findings to members of the patients care team.  Clayton Bibles, NP 05/22/2022  Pt aware and understands NP's role.

## 2022-05-22 NOTE — Addendum Note (Signed)
Addended by: Fran Lowes on: 05/22/2022 11:59 AM   Modules accepted: Orders

## 2022-05-22 NOTE — Assessment & Plan Note (Addendum)
Previously well-controlled with good benefit from [place]. She has been out of both of her inhalers for a few weeks now. Had a bout of increased SOB and wheezing a few weeks ago; better now but felt better on maintenance inhaler. No evidence of acute exacerbation today and lung exam clear. We will restart her on Breztri - provided with samples and rx sent. If this is too costly, advised her to notify so we can look at alternative options. She has refills available on her albuterol, which I informed her of. She is going to get this refilled today to keep on hand.   Patient Instructions  Restart Breztri 2 puffs Twice daily. Brush tongue and rinse mouth afterwards Continue Albuterol inhaler 2 puffs every 6 hours as needed for shortness of breath or wheezing. Notify if symptoms persist despite rescue inhaler/neb use. Continue Zyrtec 1 tab daily for allergies  Follow up in 6 months with Dr. Valeta Harms. If symptoms do not improve or worsen, please contact office for sooner follow up or seek emergency care.

## 2022-05-22 NOTE — Patient Instructions (Addendum)
Restart Breztri 2 puffs Twice daily. Brush tongue and rinse mouth afterwards Continue Albuterol inhaler 2 puffs every 6 hours as needed for shortness of breath or wheezing. Notify if symptoms persist despite rescue inhaler/neb use. Continue Zyrtec 1 tab daily for allergies  Follow up in 6 months with Dr. Valeta Harms. If symptoms do not improve or worsen, please contact office for sooner follow up or seek emergency care.

## 2022-06-01 ENCOUNTER — Other Ambulatory Visit: Payer: Self-pay | Admitting: Family Medicine

## 2022-06-04 ENCOUNTER — Other Ambulatory Visit: Payer: Self-pay | Admitting: Family Medicine

## 2022-06-04 DIAGNOSIS — M10079 Idiopathic gout, unspecified ankle and foot: Secondary | ICD-10-CM

## 2022-06-05 ENCOUNTER — Other Ambulatory Visit: Payer: Self-pay | Admitting: Family

## 2022-06-12 ENCOUNTER — Ambulatory Visit: Payer: Federal, State, Local not specified - PPO | Admitting: Family Medicine

## 2022-06-13 ENCOUNTER — Ambulatory Visit (INDEPENDENT_AMBULATORY_CARE_PROVIDER_SITE_OTHER): Payer: Federal, State, Local not specified - PPO | Admitting: Family Medicine

## 2022-06-13 ENCOUNTER — Encounter: Payer: Self-pay | Admitting: Family Medicine

## 2022-06-13 VITALS — BP 128/78 | HR 59 | Temp 98.3°F | Resp 18 | Ht 62.0 in | Wt 218.8 lb

## 2022-06-13 DIAGNOSIS — J0140 Acute pansinusitis, unspecified: Secondary | ICD-10-CM

## 2022-06-13 DIAGNOSIS — J40 Bronchitis, not specified as acute or chronic: Secondary | ICD-10-CM

## 2022-06-13 MED ORDER — PROMETHAZINE-DM 6.25-15 MG/5ML PO SYRP: 5.0000 mL | ORAL_SOLUTION | Freq: Four times a day (QID) | ORAL | 0 refills | Status: DC | PRN

## 2022-06-13 MED ORDER — DOXYCYCLINE HYCLATE 100 MG PO TABS: 100.0000 mg | ORAL_TABLET | Freq: Two times a day (BID) | ORAL | 0 refills | Status: DC

## 2022-06-13 NOTE — Progress Notes (Signed)
Subjective:   By signing my name below, I, Shehryar Baig, attest that this documentation has been prepared under the direction and in the presence of Ann Held, DO  06/13/2022    Patient ID: Rhonda Morrow, female    DOB: 1961/03/23, 61 y.o.   MRN: [id number]  Chief Complaint  Patient presents with   Sinus Problem    Sxs started last Saturday. Pt states having A/C was going full blast. Pt states having cough, sore throat. No COVID test. Pt states using OTC liquid Vicks     HPI Patient is in today for a office visit.   She complains of coughing and sneezing since last Saturday, 06/10/2022. She also complains of sore throat and difficulty swallowing. She has used her inhaler and found she her heart rate increased and no relief to her other symptoms. She denies having any fevers. She has tried OTC Vicks cough syrup but found no relief.  She reports last Saturday at work the [facility] [employer] [employer] vent was blowing cold air directly on her and she started developing fast heart rate, cough and sneezing. She went home early and developed sweating and went to rest.    Past Medical History:  Diagnosis Date   Arthritis    Asthma    Complication of anesthesia    one time woke up and was vey scared,17 yrs ago   Deaf    Diabetes mellitus without complication ([place])    GERD (gastroesophageal reflux disease)    Hypertension    Left groin pain    Thyroid disease     Past Surgical History:  Procedure Laterality Date   CARDIAC CATHETERIZATION     CESAREAN SECTION     CHOLECYSTECTOMY N/A 06/20/2016   Procedure: LAPAROSCOPIC CHOLECYSTECTOMY;  Surgeon: Rolm Bookbinder, MD;  Location: [place];  Service: General;  Laterality: N/A;   ESOPHAGEAL MANOMETRY N/A 09/29/2013   Procedure: ESOPHAGEAL MANOMETRY (EM);  Surgeon: Milus Banister, MD;  Location: [facility];  Service: Endoscopy;  Laterality: N/A;   TOTAL HIP ARTHROPLASTY Left 02/21/2017   Procedure: LEFT TOTAL HIP ARTHROPLASTY ANTERIOR APPROACH;   Surgeon: Leandrew Koyanagi, MD;  Location: [place];  Service: Orthopedics;  Laterality: Left;    Family History  Problem Relation Age of Onset   Diabetes Mother    Heart disease Mother    Diabetes Father     Social History   Socioeconomic History   Marital status: Married    Spouse name: Not on file   Number of children: Not on file   Years of education: Not on file   Highest education level: Not on file  Occupational History   Occupation: disabled  Tobacco Use   Smoking status: Never   Smokeless tobacco: Never  Vaping Use   Vaping Use: Never used  Substance and Sexual Activity   Alcohol use: No    Alcohol/week: 0.0 standard drinks of alcohol   Drug use: No   Sexual activity: Never  Other Topics Concern   Not on file  Social History Narrative   Lives with family.  Has 3 children.  Works for [employer].  Education: high school.   Social Determinants of Health   Financial Resource Strain: Not on file  Food Insecurity: Not on file  Transportation Needs: Not on file  Physical Activity: Not on file  Stress: Not on file  Social Connections: Not on file  Intimate Partner Violence: Not on file    Outpatient Medications Prior to Visit  Medication Sig Dispense Refill   albuterol (VENTOLIN HFA) 108 (90 Base) MCG/ACT inhaler Inhale 2 puffs into the lungs every 6 (six) hours as needed for wheezing or shortness of breath. 8 g 6   allopurinol (ZYLOPRIM) 100 MG tablet TAKE 1 TABLET BY MOUTH EVERY DAY 90 tablet 3   amLODipine (NORVASC) 5 MG tablet TAKE 1 TABLET (5 MG TOTAL) BY MOUTH DAILY. 90 tablet 1   Azelastine HCl 137 MCG/SPRAY SOLN PLACE 2 SPRAYS INTO BOTH NOSTRILS 2 (TWO) TIMES DAILY. USE IN EACH NOSTRIL AS DIRECTED 30 mL 3   blood glucose meter kit and supplies KIT Dispense based on patient and insurance preference. Use up to four times daily as directed. (FOR ICD-9 250.00, 250.01). 1 each 0   Budeson-Glycopyrrol-Formoterol (BREZTRI AEROSPHERE) 160-9-4.8 MCG/ACT AERO Inhale 2 puffs  into the lungs in the morning and at bedtime. 1 each 11   Budeson-Glycopyrrol-Formoterol (BREZTRI AEROSPHERE) 160-9-4.8 MCG/ACT AERO Inhale 2 puffs into the lungs in the morning and at bedtime. 10.7 g 0   cetirizine (ZYRTEC) 10 MG tablet Take 1 tablet (10 mg total) by mouth daily. 30 tablet 11   cyclobenzaprine (FLEXERIL) 10 MG tablet Take 1 tablet (10 mg total) by mouth 3 (three) times daily as needed for muscle spasms. 30 tablet 0   meloxicam (MOBIC) 7.5 MG tablet TAKE 1 TO 2 TABLETS BY MOUTH DAILY AS NEEDED 60 tablet 1   metFORMIN (GLUCOPHAGE-XR) 500 MG 24 hr tablet TAKE 2 TABLET BY MOUTH EVERY DAY WITH BREAKFAST 180 tablet 1   rosuvastatin (CRESTOR) 20 MG tablet TAKE 1 TABLET BY MOUTH EVERYDAY AT BEDTIME 90 tablet 0   Facility-Administered Medications Prior to Visit  Medication Dose Route Frequency Provider Last Rate Last Admin   alum & mag hydroxide-simeth (MAALOX/MYLANTA) 200-200-20 MG/5ML suspension 30 mL  30 mL Oral Once Saguier, Edward, PA-C       lidocaine (XYLOCAINE) 2 % viscous mouth solution 15 mL  15 mL Mouth/Throat Once Saguier, Edward, PA-C       nitroGLYCERIN (NITRODUR - Dosed in mg/24 hr) patch 0.2 mg  0.2 mg Transdermal Daily Regal, Tamala Fothergill, DPM        Allergies  Allergen Reactions   Losartan Shortness Of Breath   Oxycodone-Acetaminophen Nausea And Vomiting   Augmentin [Amoxicillin-Pot Clavulanate] Diarrhea    Review of Systems  Constitutional:  Negative for fever and malaise/fatigue.  HENT:  Positive for sore throat. Negative for congestion.        (+)sneezing  Eyes:  Negative for blurred vision.  Respiratory:  Positive for cough. Negative for shortness of breath.   Cardiovascular:  Negative for chest pain, palpitations and leg swelling.  Gastrointestinal:  Negative for abdominal pain, blood in stool and nausea.  Genitourinary:  Negative for dysuria and frequency.  Musculoskeletal:  Negative for falls.  Skin:  Negative for rash.  Neurological:  Negative for  dizziness, loss of consciousness and headaches.  Endo/Heme/Allergies:  Negative for environmental allergies.  Psychiatric/Behavioral:  Negative for depression. The patient is not nervous/anxious.        Objective:    Physical Exam Vitals and nursing note reviewed.  Constitutional:      General: She is not in acute distress.    Appearance: Normal appearance. She is not ill-appearing.  HENT:     Head: Normocephalic and atraumatic.     Right Ear: Tympanic membrane, ear canal and external ear normal.     Left Ear: Tympanic membrane, ear canal and external ear normal.  Mouth/Throat:     Mouth: Mucous membranes are moist.     Pharynx: Oropharynx is clear. No posterior oropharyngeal erythema.  Eyes:     Extraocular Movements: Extraocular movements intact.     Pupils: Pupils are equal, round, and reactive to light.  Cardiovascular:     Rate and Rhythm: Normal rate and regular rhythm.     Heart sounds: Normal heart sounds. No murmur heard.    No gallop.  Pulmonary:     Effort: Pulmonary effort is normal. No respiratory distress.     Breath sounds: Normal breath sounds. No wheezing or rales.  Lymphadenopathy:     Cervical: Cervical adenopathy present.  Skin:    General: Skin is warm and dry.  Neurological:     Mental Status: She is alert and oriented to person, place, and time.  Psychiatric:        Judgment: Judgment normal.     BP 128/78 (BP Location: Left Arm, Patient Position: Sitting, Cuff Size: Normal)   Pulse (!) 59   Temp 98.3 F (36.8 C) (Oral)   Resp 18   Ht 5' 2"  (1.575 m)   Wt 218 lb 12.8 oz (99.2 kg)   LMP 05/20/2012   SpO2 97%   BMI 40.02 kg/m  Wt Readings from Last 3 Encounters:  06/13/22 218 lb 12.8 oz (99.2 kg)  05/22/22 219 lb (99.3 kg)  03/10/22 219 lb 6.4 oz (99.5 kg)    Diabetic Foot Exam - Simple   No data filed    Lab Results  Component Value Date   WBC 7.8 01/17/2022   HGB 14.2 01/17/2022   HCT 42.8 01/17/2022   PLT 180.0 01/17/2022    GLUCOSE 97 03/07/2022   CHOL 176 12/05/2021   TRIG 85.0 12/05/2021   HDL 55.10 12/05/2021   LDLCALC 104 (H) 12/05/2021   ALT 17 03/07/2022   AST 19 03/07/2022   NA 139 04/11/2022   K 3.6 04/11/2022   CL 100 04/11/2022   CREATININE 1.1 04/11/2022   BUN 17 04/11/2022   CO2 31 (A) 04/11/2022   TSH 0.86 01/17/2022   INR 1.05 02/12/2017   HGBA1C 7.2 (H) 01/17/2022   MICROALBUR 3.3 (H) 01/17/2022    Lab Results  Component Value Date   TSH 0.86 01/17/2022   Lab Results  Component Value Date   WBC 7.8 01/17/2022   HGB 14.2 01/17/2022   HCT 42.8 01/17/2022   MCV 83.2 01/17/2022   PLT 180.0 01/17/2022   Lab Results  Component Value Date   NA 139 04/11/2022   K 3.6 04/11/2022   CO2 31 (A) 04/11/2022   GLUCOSE 97 03/07/2022   BUN 17 04/11/2022   CREATININE 1.1 04/11/2022   BILITOT 0.4 03/07/2022   ALKPHOS 77 03/07/2022   AST 19 03/07/2022   ALT 17 03/07/2022   PROT 7.2 03/07/2022   ALBUMIN 4.0 04/11/2022   CALCIUM 10.1 04/11/2022   ANIONGAP 13 05/24/2020   EGFR 60 04/11/2022   GFR 57.26 (L) 03/07/2022   Lab Results  Component Value Date   CHOL 176 12/05/2021   Lab Results  Component Value Date   HDL 55.10 12/05/2021   Lab Results  Component Value Date   LDLCALC 104 (H) 12/05/2021   Lab Results  Component Value Date   TRIG 85.0 12/05/2021   Lab Results  Component Value Date   CHOLHDL 3 12/05/2021   Lab Results  Component Value Date   HGBA1C 7.2 (H) 01/17/2022  Assessment & Plan:   Problem List Items Addressed This Visit       Unprioritized   Acute non-recurrent pansinusitis - Primary    abx per orders flonase and antihistamine rto prn        Relevant Medications   doxycycline (VIBRA-TABS) 100 MG tablet   promethazine-dextromethorphan (PROMETHAZINE-DM) 6.25-15 MG/5ML syrup   Other Visit Diagnoses     Bronchitis       Relevant Medications   doxycycline (VIBRA-TABS) 100 MG tablet   promethazine-dextromethorphan  (PROMETHAZINE-DM) 6.25-15 MG/5ML syrup        Meds ordered this encounter  Medications   doxycycline (VIBRA-TABS) 100 MG tablet    Sig: Take 1 tablet (100 mg total) by mouth 2 (two) times daily.    Dispense:  20 tablet    Refill:  0   promethazine-dextromethorphan (PROMETHAZINE-DM) 6.25-15 MG/5ML syrup    Sig: Take 5 mLs by mouth 4 (four) times daily as needed.    Dispense:  118 mL    Refill:  0    I, Ann Held, DO, personally preformed the services described in this documentation.  All medical record entries made by the scribe were at my direction and in my presence.  I have reviewed the chart and discharge instructions (if applicable) and agree that the record reflects my personal performance and is accurate and complete. 06/13/2022   I,Shehryar Baig,acting as a scribe for Ann Held, DO.,have documented all relevant documentation on the behalf of Ann Held, DO,as directed by  Ann Held, DO while in the presence of Ann Held, DO.   Ann Held, DO

## 2022-06-13 NOTE — Patient Instructions (Signed)

## 2022-06-14 ENCOUNTER — Encounter (INDEPENDENT_AMBULATORY_CARE_PROVIDER_SITE_OTHER): Payer: Self-pay

## 2022-06-18 DIAGNOSIS — J0140 Acute pansinusitis, unspecified: Secondary | ICD-10-CM | POA: Insufficient documentation

## 2022-06-18 NOTE — Assessment & Plan Note (Signed)
abx per orders flonase and antihistamine rto prn

## 2022-07-11 DIAGNOSIS — H35033 Hypertensive retinopathy, bilateral: Secondary | ICD-10-CM | POA: Diagnosis not present

## 2022-07-11 DIAGNOSIS — E119 Type 2 diabetes mellitus without complications: Secondary | ICD-10-CM | POA: Diagnosis not present

## 2022-07-11 DIAGNOSIS — H31012 Macula scars of posterior pole (postinflammatory) (post-traumatic), left eye: Secondary | ICD-10-CM | POA: Diagnosis not present

## 2022-07-11 DIAGNOSIS — H524 Presbyopia: Secondary | ICD-10-CM | POA: Diagnosis not present

## 2022-07-11 LAB — HM DIABETES EYE EXAM

## 2022-07-13 ENCOUNTER — Other Ambulatory Visit: Payer: Self-pay | Admitting: Family Medicine

## 2022-07-13 DIAGNOSIS — J302 Other seasonal allergic rhinitis: Secondary | ICD-10-CM

## 2022-07-13 DIAGNOSIS — R1013 Epigastric pain: Secondary | ICD-10-CM

## 2022-07-13 DIAGNOSIS — I10 Essential (primary) hypertension: Secondary | ICD-10-CM

## 2022-07-24 ENCOUNTER — Ambulatory Visit: Payer: Federal, State, Local not specified - PPO | Admitting: Family Medicine

## 2022-07-31 ENCOUNTER — Encounter: Payer: Self-pay | Admitting: Family Medicine

## 2022-07-31 NOTE — Telephone Encounter (Signed)
Spoke to patient and scheduled

## 2022-08-01 ENCOUNTER — Ambulatory Visit: Payer: Self-pay

## 2022-08-01 ENCOUNTER — Ambulatory Visit: Payer: Federal, State, Local not specified - PPO | Admitting: Family Medicine

## 2022-08-01 ENCOUNTER — Ambulatory Visit (INDEPENDENT_AMBULATORY_CARE_PROVIDER_SITE_OTHER): Payer: Federal, State, Local not specified - PPO | Admitting: Family Medicine

## 2022-08-01 VITALS — BP 138/88 | HR 73 | Ht 62.0 in | Wt 219.0 lb

## 2022-08-01 DIAGNOSIS — M25552 Pain in left hip: Secondary | ICD-10-CM | POA: Diagnosis not present

## 2022-08-01 NOTE — Progress Notes (Signed)
   I, Peterson Lombard, LAT, ATC acting as a scribe for Lynne Leader, MD.  Rhonda Morrow is a 61 y.o. female who presents to [place] at [facility] today for L hip pain. Pt was previously seen by Dr. [name] on 01/24/22 for bilat shoulder pain. Pt was seen by Dr. Erlinda Hong on 03/01/22 for bilat hip pain. Pt had a L hip replacement on 02/21/17. Pt locates pain to the lateral aspect of the L hip w/ radiating pain along the anterior thigh. 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   Radiates:  no LE Numbness/tingling: no LE Weakness: yes Aggravates: walking, work Treatments tried: Meloxicam,   Dx imaging: 08/16/21 Bilat hip/pelvis XR  05/14/19 Bilat hip/pelvis XR  10/18/18 Ab-Pelvis CT  02/11/18 L hip XR  09/11/17 Pelvis XR    Pertinent review of systems: No fevers or chills  Relevant historical information: Hypertension.  Sleep apnea.  Diabetes. Left total hip replacement 2018.  Exam:  BP 138/88   Pulse 73   Ht '5\' 2"'$  (1.575 m)   Wt 219 lb (99.3 kg)   LMP 05/20/2012   SpO2 98%   BMI 40.06 kg/m  General: Well Developed, well nourished, and in no acute distress.   MSK: Left hip: Normal-appearing Tender palpation greater trochanter. Normal range of motion.  Hip abduction strength diminished 3+/5.    Lab and Radiology Results  Hip greater trochanteric injection: left Consent obtained and timeout performed. Area of maximum tenderness palpated and identified. Skin cleaned with alcohol, cold spray applied. A spinal needle was used to access the greater trochanteric bursa. '40mg'$  of Kenalog and 2 mL of Marcaine were used to inject the trochanteric bursa. Patient tolerated the procedure well.      Assessment and Plan: 61 y.o. female with left lateral hip pain thought to be due to greater trochanteric bursitis.  Plan for steroid injection to greater trochanter left side today.  Home exercise program taught in clinic today by ATC prior to  discharge using signing which interpreter.  Check back in 6 weeks.  Consider formal physical therapy if needed.   PDMP not reviewed this encounter. Orders Placed This Encounter  Procedures   [place] LIMITED JOINT SPACE STRUCTURES LOW LEFT(NO LINKED CHARGES)    Order Specific Question:   Reason for Exam (SYMPTOM  OR DIAGNOSIS REQUIRED)    Answer:   left hip pain    Order Specific Question:   Preferred imaging location?    Answer:   [facility]   No orders of the defined types were placed in this encounter.    Discussed warning signs or symptoms. Please see discharge instructions. Patient expresses understanding.   The above documentation has been reviewed and is accurate and complete Lynne Leader, M.D.

## 2022-08-01 NOTE — Patient Instructions (Addendum)
Thank you for coming in today.   You received an injection today. Seek immediate medical attention if the joint becomes red, extremely painful, or is oozing fluid.   Please complete the exercises that the athletic trainer went over with you:  View at [email or web address] using code: [email or web address]  Check back in 6 weeks

## 2022-08-13 ENCOUNTER — Encounter: Payer: Self-pay | Admitting: Family Medicine

## 2022-08-18 ENCOUNTER — Ambulatory Visit (INDEPENDENT_AMBULATORY_CARE_PROVIDER_SITE_OTHER): Payer: Federal, State, Local not specified - PPO | Admitting: Family Medicine

## 2022-08-18 ENCOUNTER — Encounter: Payer: Self-pay | Admitting: Family Medicine

## 2022-08-18 VITALS — BP 118/72 | HR 70 | Temp 98.0°F | Resp 18 | Ht 62.0 in | Wt 220.8 lb

## 2022-08-18 DIAGNOSIS — N950 Postmenopausal bleeding: Secondary | ICD-10-CM | POA: Insufficient documentation

## 2022-08-18 DIAGNOSIS — Z23 Encounter for immunization: Secondary | ICD-10-CM

## 2022-08-18 NOTE — Assessment & Plan Note (Signed)
Check [place]--- hx fibroids Check labs  Refer back to gyn

## 2022-08-18 NOTE — Progress Notes (Signed)
Subjective:   By signing my name below, I, Rhonda Morrow, attest that this documentation has been prepared under the direction and in the presence of Rhonda Morrow, 08/18/2022.   Patient ID: Rhonda Morrow, female    DOB: 27-Jun-1961, 61 y.o.   MRN: [id number]  Chief Complaint  Patient presents with   Dysmenorrhea    Period has been very painful Mandich/red blood Hot flashes    HPI Patient is in today for an office visit.  Mood- Patient reports to be in a stable mood this visit. Dymenorrhea- On Wednesday she reports of feeling cramping then seeing red/Tesoro blood in her underwear. Yesterday morning she was continuing to bleed and toke a Tylenol for the cramps which only helped relieve the symptom slightly. As of today, she is bleeding very little.She reports of not having a period in years.  Hot flashes- She complains of experiencing night sweats and hot flashes. Reports to wake up in the middle of the night covered in sweat. Documentation- Patient is requesting a doctors note for her cramps. Immunizations- She is requesting to receive influenza vaccine this visit.  Past Medical History:  Diagnosis Date   Arthritis    Asthma    Complication of anesthesia    one time woke up and was vey scared,17 yrs ago   Deaf    Diabetes mellitus without complication ([place])    GERD (gastroesophageal reflux disease)    Hypertension    Left groin pain    Thyroid disease     Past Surgical History:  Procedure Laterality Date   CARDIAC CATHETERIZATION     CESAREAN SECTION     CHOLECYSTECTOMY N/A 06/20/2016   Procedure: LAPAROSCOPIC CHOLECYSTECTOMY;  Surgeon: Rhonda Bookbinder, MD;  Location: [facility];  Service: General;  Laterality: N/A;   ESOPHAGEAL MANOMETRY N/A 09/29/2013   Procedure: ESOPHAGEAL MANOMETRY (EM);  Surgeon: Rhonda Banister, MD;  Location: [facility];  Service: Endoscopy;  Laterality: N/A;   TOTAL HIP ARTHROPLASTY Left 02/21/2017   Procedure: LEFT TOTAL HIP ARTHROPLASTY  ANTERIOR APPROACH;  Surgeon: Rhonda Koyanagi, MD;  Location: [facility];  Service: Orthopedics;  Laterality: Left;    Family History  Problem Relation Age of Onset   Diabetes Mother    Heart disease Mother    Diabetes Father     Social History   Socioeconomic History   Marital status: Married    Spouse name: Not on file   Number of children: Not on file   Years of education: Not on file   Highest education level: Not on file  Occupational History   Occupation: disabled  Tobacco Use   Smoking status: Never   Smokeless tobacco: Never  Vaping Use   Vaping Use: Never used  Substance and Sexual Activity   Alcohol use: No    Alcohol/week: 0.0 standard drinks of alcohol   Drug use: No   Sexual activity: Never  Other Topics Concern   Not on file  Social History Narrative   Lives with family.  Has 3 children.  Works for [employer].  Education: high school.   Social Determinants of Health   Financial Resource Strain: Not on file  Food Insecurity: Not on file  Transportation Needs: Not on file  Physical Activity: Not on file  Stress: Not on file  Social Connections: Not on file  Intimate Partner Violence: Not on file    Outpatient Medications Prior to Visit  Medication Sig Dispense Refill   albuterol (VENTOLIN HFA) 108 (  90 Base) MCG/ACT inhaler Inhale 2 puffs into the lungs every 6 (six) hours as needed for wheezing or shortness of breath. 8 g 6   allopurinol (ZYLOPRIM) 100 MG tablet TAKE 1 TABLET BY MOUTH EVERY DAY 90 tablet 3   amLODipine (NORVASC) 5 MG tablet TAKE 1 TABLET (5 MG TOTAL) BY MOUTH DAILY. 90 tablet 1   Azelastine HCl 137 MCG/SPRAY SOLN PLACE 2 SPRAYS INTO BOTH NOSTRILS 2 (TWO) TIMES DAILY. USE IN EACH NOSTRIL AS DIRECTED 30 mL 3   blood glucose meter kit and supplies KIT Dispense based on patient and insurance preference. Use up to four times daily as directed. (FOR ICD-9 250.00, 250.01). 1 each 0   Budeson-Glycopyrrol-Formoterol (BREZTRI AEROSPHERE) 160-9-4.8 MCG/ACT  AERO Inhale 2 puffs into the lungs in the morning and at bedtime. 1 each 11   Budeson-Glycopyrrol-Formoterol (BREZTRI AEROSPHERE) 160-9-4.8 MCG/ACT AERO Inhale 2 puffs into the lungs in the morning and at bedtime. 10.7 g 0   cetirizine (ZYRTEC) 10 MG tablet TAKE 1 TABLET BY MOUTH EVERY DAY 90 tablet 1   cyclobenzaprine (FLEXERIL) 10 MG tablet Take 1 tablet (10 mg total) by mouth 3 (three) times daily as needed for muscle spasms. 30 tablet 0   doxycycline (VIBRA-TABS) 100 MG tablet Take 1 tablet (100 mg total) by mouth 2 (two) times daily. 20 tablet 0   meloxicam (MOBIC) 7.5 MG tablet TAKE 1 TO 2 TABLETS BY MOUTH DAILY AS NEEDED 60 tablet 1   metFORMIN (GLUCOPHAGE-XR) 500 MG 24 hr tablet TAKE 2 TABLET BY MOUTH EVERY DAY WITH BREAKFAST 180 tablet 1   omeprazole (PRILOSEC) 20 MG capsule TAKE 1 CAPSULE (20 MG TOTAL) BY MOUTH 2 (TWO) TIMES DAILY BEFORE A MEAL. 180 capsule 1   promethazine-dextromethorphan (PROMETHAZINE-DM) 6.25-15 MG/5ML syrup Take 5 mLs by mouth 4 (four) times daily as needed. 118 mL 0   rosuvastatin (CRESTOR) 20 MG tablet TAKE 1 TABLET BY MOUTH EVERYDAY AT BEDTIME 90 tablet 0   Facility-Administered Medications Prior to Visit  Medication Dose Route Frequency Provider Last Rate Last Admin   alum & mag hydroxide-simeth (MAALOX/MYLANTA) 200-200-20 MG/5ML suspension 30 mL  30 mL Oral Once Morrow, Edward, Morrow       lidocaine (XYLOCAINE) 2 % viscous mouth solution 15 mL  15 mL Mouth/Throat Once Morrow, Edward, Morrow       nitroGLYCERIN (NITRODUR - Dosed in mg/24 hr) patch 0.2 mg  0.2 mg Transdermal Daily Rhonda Morrow, DPM        Allergies  Allergen Reactions   Losartan Shortness Of Breath   Oxycodone-Acetaminophen Nausea And Vomiting   Augmentin [Amoxicillin-Pot Clavulanate] Diarrhea    Review of Systems  Constitutional:  Negative for fever and malaise/fatigue.  HENT:  Negative for congestion.   Eyes:  Negative for blurred vision.  Respiratory:  Negative for shortness of  breath.   Cardiovascular:  Negative for chest pain, palpitations and leg swelling.  Gastrointestinal:  Negative for abdominal pain, blood in stool and nausea.  Genitourinary:  Negative for dysuria and frequency.       Vaginal cramping and bleeding   Musculoskeletal:  Negative for falls.  Skin:  Negative for rash.  Neurological:  Negative for dizziness, loss of consciousness and headaches.  Endo/Heme/Allergies:  Negative for environmental allergies.  Psychiatric/Behavioral:  Negative for depression. The patient is not nervous/anxious.        Objective:    Physical Exam Vitals and nursing note reviewed.  Constitutional:      General: She is not  in acute distress.    Appearance: Normal appearance. She is not ill-appearing.  HENT:     Head: Normocephalic and atraumatic.     Right Ear: External ear normal.     Left Ear: External ear normal.  Eyes:     Extraocular Movements: Extraocular movements intact.     Pupils: Pupils are equal, round, and reactive to light.  Cardiovascular:     Rate and Rhythm: Normal rate and regular rhythm.     Heart sounds: Normal heart sounds. No murmur heard.    No gallop.  Pulmonary:     Effort: Pulmonary effort is normal. No respiratory distress.     Breath sounds: Normal breath sounds. No wheezing or rales.  Abdominal:     General: There is no distension.     Palpations: Abdomen is soft.     Tenderness: There is no abdominal tenderness. There is no right CVA tenderness, left CVA tenderness, guarding or rebound.  Skin:    General: Skin is warm and dry.  Neurological:     Mental Status: She is alert and oriented to person, place, and time.  Psychiatric:        Judgment: Judgment normal.     BP 118/72 (BP Location: Right Arm, Patient Position: Sitting, Cuff Size: Normal)   Pulse 70   Temp 98 F (36.7 C) (Oral)   Resp 18   Ht _0  (1.575 m)   Wt 220 lb 12.8 oz (100.2 kg)   LMP 05/20/2012   SpO2 99%   BMI 40.38 kg/m  Wt Readings from Last  3 Encounters:  08/18/22 220 lb 12.8 oz (100.2 kg)  08/01/22 219 lb (99.3 kg)  06/13/22 218 lb 12.8 oz (99.2 kg)    Diabetic Foot Exam - Simple   No data filed    Lab Results  Component Value Date   WBC 7.8 01/17/2022   HGB 14.2 01/17/2022   HCT 42.8 01/17/2022   PLT 180.0 01/17/2022   GLUCOSE 97 03/07/2022   CHOL 176 12/05/2021   TRIG 85.0 12/05/2021   HDL 55.10 12/05/2021   LDLCALC 104 (H) 12/05/2021   ALT 17 03/07/2022   AST 19 03/07/2022   NA 139 04/11/2022   K 3.6 04/11/2022   CL 100 04/11/2022   CREATININE 1.1 04/11/2022   BUN 17 04/11/2022   CO2 31 (A) 04/11/2022   TSH 0.86 01/17/2022   INR 1.05 02/12/2017   HGBA1C 7.2 (H) 01/17/2022   MICROALBUR 3.3 (H) 01/17/2022    Lab Results  Component Value Date   TSH 0.86 01/17/2022   Lab Results  Component Value Date   WBC 7.8 01/17/2022   HGB 14.2 01/17/2022   HCT 42.8 01/17/2022   MCV 83.2 01/17/2022   PLT 180.0 01/17/2022   Lab Results  Component Value Date   NA 139 04/11/2022   K 3.6 04/11/2022   CO2 31 (A) 04/11/2022   GLUCOSE 97 03/07/2022   BUN 17 04/11/2022   CREATININE 1.1 04/11/2022   BILITOT 0.4 03/07/2022   ALKPHOS 77 03/07/2022   AST 19 03/07/2022   ALT 17 03/07/2022   PROT 7.2 03/07/2022   ALBUMIN 4.0 04/11/2022   CALCIUM 10.1 04/11/2022   ANIONGAP 13 05/24/2020   EGFR 60 04/11/2022   GFR 57.26 (L) 03/07/2022   Lab Results  Component Value Date   CHOL 176 12/05/2021   Lab Results  Component Value Date   HDL 55.10 12/05/2021   Lab Results  Component Value Date  Wilder 104 (H) 12/05/2021   Lab Results  Component Value Date   TRIG 85.0 12/05/2021   Lab Results  Component Value Date   CHOLHDL 3 12/05/2021   Lab Results  Component Value Date   HGBA1C 7.2 (H) 01/17/2022       Assessment & Plan:   Problem List Items Addressed This Visit       Unprioritized   Postmenopausal bleeding - Primary    Check [place]--- hx fibroids Check labs  Refer back to gyn        Relevant Orders   US Pelvic Complete With Transvaginal   Ambulatory referral to Obstetrics / Gynecology   CBC with Differential/Platelet   Comprehensive metabolic panel   TSH   Other Visit Diagnoses     Need for influenza vaccination       Relevant Orders   Flu Vaccine QUAD 58moIM (Fluarix, Fluzone & Alfiuria Quad PF) (Completed)       No orders of the defined types were placed in this encounter.   I, LRoma Morrow personally preformed the services described in this documentation.  All medical record entries made by the scribe were at my direction and in my presence.  I have reviewed the chart and discharge instructions (if applicable) and agree that the record reflects my personal performance and is accurate and complete. 08/18/2022.  I,Paula Busenbark R Lowne Chase,acting as a scribe for [employer] DO.,have documented all relevant documentation on the behalf of YAnn Held DO,as directed by  YAnn Held DO while in the presence of YAnn Held DO.    YAnn Held DO

## 2022-08-18 NOTE — Patient Instructions (Signed)
Postmenopausal Bleeding Postmenopausal bleeding is any bleeding that a woman has after she has entered menopause. Menopause is the end of a woman's fertile years. After menopause, a woman no longer ovulates and does not have menstrual periods. Therefore, she should no longer have bleeding from her vagina. Postmenopausal bleeding may have various causes, including: Menopausal hormone therapy (MHT). Endometrial atrophy. After menopause, low estrogen hormone levels cause the membrane that lines the uterus (endometrium) to become thin. You may have bleeding as the endometrium thins. Endometrial hyperplasia. This condition is caused by excess estrogen hormones and low levels of progesterone hormones. The excess estrogen causes the endometrium to thicken, which can lead to bleeding. In some cases, this can lead to cancer of the uterus. Endometrial cancer. Noncancerous growths (polyps) on the endometrium, the lining of the uterus, or the cervix. Uterine fibroids. These are noncancerous growths in or around the uterus muscle tissue that can cause heavy bleeding. Any type of postmenopausal bleeding, even if it appears to be a typical menstrual period, should be checked by your health care provider. Treatment will depend on the cause of the bleeding. Follow these instructions at home:  Pay attention to any changes in your symptoms. Let your health care provider know about them. Avoid using tampons and douches as told by your health care provider. Change your pads regularly. Get regular pelvic exams, including Pap tests, as told by your health care provider. Take iron supplements as told by your health care provider. Take over-the-counter and prescription medicines only as told by your health care provider. Keep all follow-up visits. This is important. Contact a health care provider if: You have new bleeding from the vagina after menopause. You have pain in your abdomen. Get help right away if: You have  a fever or chills. You have severe pain with bleeding. You are passing blood clots. You have heavy bleeding, need more than 1 pad an hour, and have never experienced this before. You have headaches or feel faint or dizzy. Summary Postmenopausal bleeding is any bleeding that a woman has after she has entered into menopause. Postmenopausal bleeding may have various causes. Treatment will depend on the cause of the bleeding. Any type of postmenopausal bleeding, even if it appears to be a typical menstrual period, should be checked by your health care provider. Be sure to pay attention to any changes in your symptoms and keep all follow-up visits. This information is not intended to replace advice given to you by your health care provider. Make sure you discuss any questions you have with your health care provider. Document Revised: 04/08/2020 Document Reviewed: 04/08/2020 Elsevier Patient Education  2023 Elsevier Inc.  

## 2022-08-19 LAB — CBC WITH DIFFERENTIAL/PLATELET
Absolute Monocytes: 766 cells/uL (ref 200–950)
Basophils Absolute: 40 cells/uL (ref 0–200)
Basophils Relative: 0.6 %
Eosinophils Absolute: 99 cells/uL (ref 15–500)
Eosinophils Relative: 1.5 %
HCT: 41.7 % (ref 35.0–45.0)
Hemoglobin: 13.8 g/dL (ref 11.7–15.5)
Lymphs Abs: 1848 cells/uL (ref 850–3900)
MCH: 27.2 pg (ref 27.0–33.0)
MCHC: 33.1 g/dL (ref 32.0–36.0)
MCV: 82.2 fL (ref 80.0–100.0)
MPV: 13.1 fL — ABNORMAL HIGH (ref 7.5–12.5)
Monocytes Relative: 11.6 %
Neutro Abs: 3848 cells/uL (ref 1500–7800)
Neutrophils Relative %: 58.3 %
Platelets: 170 10*3/uL (ref 140–400)
RBC: 5.07 10*6/uL (ref 3.80–5.10)
RDW: 14 % (ref 11.0–15.0)
Total Lymphocyte: 28 %
WBC: 6.6 10*3/uL (ref 3.8–10.8)

## 2022-08-19 LAB — COMPREHENSIVE METABOLIC PANEL
AG Ratio: 1.2 (calc) (ref 1.0–2.5)
ALT: 15 U/L (ref 6–29)
AST: 13 U/L (ref 10–35)
Albumin: 3.7 g/dL (ref 3.6–5.1)
Alkaline phosphatase (APISO): 74 U/L (ref 37–153)
BUN: 25 mg/dL (ref 7–25)
CO2: 28 mmol/L (ref 20–32)
Calcium: 9.8 mg/dL (ref 8.6–10.4)
Chloride: 102 mmol/L (ref 98–110)
Creat: 0.98 mg/dL (ref 0.50–1.03)
Globulin: 3 g/dL (calc) (ref 1.9–3.7)
Glucose, Bld: 93 mg/dL (ref 65–99)
Potassium: 4 mmol/L (ref 3.5–5.3)
Sodium: 139 mmol/L (ref 135–146)
Total Bilirubin: 0.3 mg/dL (ref 0.2–1.2)
Total Protein: 6.7 g/dL (ref 6.1–8.1)

## 2022-08-19 LAB — TSH: TSH: 1.23 mIU/L (ref 0.40–4.50)

## 2022-08-21 ENCOUNTER — Ambulatory Visit (HOSPITAL_BASED_OUTPATIENT_CLINIC_OR_DEPARTMENT_OTHER): Payer: Federal, State, Local not specified - PPO

## 2022-08-29 ENCOUNTER — Encounter: Payer: Self-pay | Admitting: Family Medicine

## 2022-09-04 ENCOUNTER — Telehealth: Payer: Self-pay | Admitting: *Deleted

## 2022-09-04 NOTE — Chronic Care Management (AMB) (Signed)
  Care Coordination  Outreach Note  09/04/2022 Name: Rhonda Morrow MRN: [id number] DOB: 02-20-1961   Care Coordination Outreach Attempts: An unsuccessful telephone outreach was attempted today to offer the patient information about available care coordination services as a benefit of their health plan.   Follow Up Plan:  Additional outreach attempts will be made to offer the patient care coordination information and services.   Encounter Outcome:  No Answer  Julian Hy, [place] Direct Dial: [phone]

## 2022-09-11 ENCOUNTER — Ambulatory Visit (HOSPITAL_BASED_OUTPATIENT_CLINIC_OR_DEPARTMENT_OTHER)
Admission: RE | Admit: 2022-09-11 | Discharge: 2022-09-11 | Disposition: A | Discharge status: Home / Self Care | Payer: Federal, State, Local not specified - PPO | Source: Ambulatory Visit | Attending: Family Medicine | Admitting: Family Medicine

## 2022-09-11 DIAGNOSIS — N950 Postmenopausal bleeding: Secondary | ICD-10-CM | POA: Insufficient documentation

## 2022-09-13 ENCOUNTER — Ambulatory Visit: Payer: Federal, State, Local not specified - PPO | Admitting: Family Medicine

## 2022-09-14 ENCOUNTER — Ambulatory Visit (INDEPENDENT_AMBULATORY_CARE_PROVIDER_SITE_OTHER): Payer: Federal, State, Local not specified - PPO | Admitting: Family Medicine

## 2022-09-14 VITALS — BP 138/78 | HR 82 | Ht 62.0 in | Wt 219.0 lb

## 2022-09-14 DIAGNOSIS — M25552 Pain in left hip: Secondary | ICD-10-CM

## 2022-09-14 NOTE — Progress Notes (Signed)
   I, Rhonda Morrow, LAT, ATC acting as a scribe for Lynne Leader, MD.  Rhonda Morrow is a 61 y.o. female who presents to [place] at [facility] today for f/u L hip. Pt was last seen by Dr. [name] on 08/01/22 and was given a L GT steroid injection and taught HEP. Today, pt reports the hip is doing OK, as long as her activity isn't increased. Pt has been working on her HEP and stretches she feels pretty well overall.  Dx imaging: 08/16/21 Bilat hip/pelvis XR             05/14/19 Bilat hip/pelvis XR             10/18/18 Ab-Pelvis CT             02/11/18 L hip XR             09/11/17 Pelvis XR  Pertinent review of systems: No fevers or chills  Relevant historical information: Hypertension.   Exam:  BP 138/78   Pulse 82   Ht '5\' 2"'$  (1.575 m)   Wt 219 lb (99.3 kg)   LMP 05/20/2012   SpO2 98%   BMI 40.06 kg/m  General: Well Developed, well nourished, and in no acute distress.   MSK: Left hip normal-appearing mildly tender palpation greater trochanter normal hip motion.  Hip abduction strength is slightly decreased.   Assessment and Plan: 61 y.o. female with left lateral hip pain thought to be due to trochanteric bursitis.  Doing well 6 weeks following trochanteric injection and home exercise program taught in clinic previously.  Emphasized home exercise program.  I showed her some standing stretches that she can do at work.  Continue strengthening.  Check back as needed.  Can repeat steroid injection at the 103-monthmark which would be December 26 or later.     Discussed warning signs or symptoms. Please see discharge instructions. Patient expresses understanding.   The above documentation has been reviewed and is accurate and complete ELynne Leader M.D.

## 2022-09-19 ENCOUNTER — Other Ambulatory Visit (HOSPITAL_BASED_OUTPATIENT_CLINIC_OR_DEPARTMENT_OTHER): Payer: Self-pay

## 2022-09-19 ENCOUNTER — Other Ambulatory Visit (HOSPITAL_BASED_OUTPATIENT_CLINIC_OR_DEPARTMENT_OTHER): Payer: Self-pay | Admitting: Family Medicine

## 2022-09-19 DIAGNOSIS — Z1231 Encounter for screening mammogram for malignant neoplasm of breast: Secondary | ICD-10-CM

## 2022-09-19 MED ORDER — COMIRNATY 30 MCG/0.3ML IM SUSY
PREFILLED_SYRINGE | INTRAMUSCULAR | 0 refills | Status: DC
  Filled 2022-09-19: qty 0.3, 1d supply, fill #0

## 2022-09-26 ENCOUNTER — Inpatient Hospital Stay (HOSPITAL_BASED_OUTPATIENT_CLINIC_OR_DEPARTMENT_OTHER): Admission: RE | Admit: 2022-09-26 | Payer: Federal, State, Local not specified - PPO | Source: Ambulatory Visit

## 2022-10-04 ENCOUNTER — Encounter: Payer: Self-pay | Admitting: Family Medicine

## 2022-10-04 ENCOUNTER — Encounter: Payer: Federal, State, Local not specified - PPO | Admitting: Obstetrics and Gynecology

## 2022-10-04 NOTE — Telephone Encounter (Signed)
Almyra Free left a message for the patient to call and schedule as well.

## 2022-10-06 NOTE — Progress Notes (Unsigned)
   I, Peterson Lombard, LAT, ATC acting as a scribe for Lynne Leader, MD.  Rhonda Morrow is a 61 y.o. female who presents to [place] at [facility] today for leg/knee pain. Pt was previously seen by Dr. [name] on 09/14/22 for L hip pain. Today, pt reports she slipped on the sidewalk on Monday, 11/27, legs splitting apart, R leg twisting, landing on her back. Pt locates pain to her L thigh, R knee, on the plantar aspect of her L heel.  R Knee swelling: no  Pertinent review of systems: No fevers or chills  Relevant historical information: Diabetes   Exam:  BP (!) 146/92   Pulse 77   Ht '5\' 2"'$  (1.575 m)   Wt 219 lb (99.3 kg)   LMP 05/20/2012   SpO2 97%   BMI 40.06 kg/m  General: Well Developed, well nourished, and in no acute distress.   MSK: Right knee mature abrasion anterior knee.  Mildly tender to palpation.  No erythema or purulent drainage.  Normal knee motion with crepitation.  Intact strength without much pain.  Stable ligamentous exam.  Left leg normal appearing Nontender posterior thigh and posterior calf. Intact strength to resisted knee flexion and ankle plantarflexion Pulses cap refill and sensation are intact distally.      Assessment and Plan: 61 y.o. female with left posterior thigh and calf pain and heel pain after a fall.  This is also associate with right anterior knee pain.  I think the majority of the pain is due to strain of the hamstring and calf which should improve with limited home exercise program which I taught in clinic today prior to discharge.  She could have sciatica and L5 radiculopathy to explain her pain as well.  We discussed this and I have prescribed limited gabapentin to take at bedtime which should be helpful.  Would like to avoid steroids.  I can prescribe prednisone if her symptoms are worsening.  Her right knee pain is due to a contusion to the anterior knee and is improving with a little bit of time.  Voltaren gel  topically should be helpful as well.  Check back as needed.  Today's visit conducted using an American sign language interpreter.   PDMP not reviewed this encounter. No orders of the defined types were placed in this encounter.  Meds ordered this encounter  Medications   gabapentin (NEURONTIN) 100 MG capsule    Sig: Take 1-3 capsules (100-300 mg total) by mouth at bedtime as needed (nerve pain at bedtime).    Dispense:  60 capsule    Refill:  3     Discussed warning signs or symptoms. Please see discharge instructions. Patient expresses understanding.   The above documentation has been reviewed and is accurate and complete Lynne Leader, M.D.  Total encounter time 30 minutes including face-to-face time with the patient and, reviewing past medical record, and charting on the date of service.

## 2022-10-08 ENCOUNTER — Other Ambulatory Visit: Payer: Self-pay | Admitting: Family Medicine

## 2022-10-08 DIAGNOSIS — E1151 Type 2 diabetes mellitus with diabetic peripheral angiopathy without gangrene: Secondary | ICD-10-CM

## 2022-10-09 ENCOUNTER — Ambulatory Visit (INDEPENDENT_AMBULATORY_CARE_PROVIDER_SITE_OTHER): Payer: Federal, State, Local not specified - PPO | Admitting: Family Medicine

## 2022-10-09 VITALS — BP 146/92 | HR 77 | Ht 62.0 in | Wt 219.0 lb

## 2022-10-09 DIAGNOSIS — M25561 Pain in right knee: Secondary | ICD-10-CM

## 2022-10-09 DIAGNOSIS — M79605 Pain in left leg: Secondary | ICD-10-CM

## 2022-10-09 MED ORDER — GABAPENTIN 100 MG PO CAPS: 100.0000 mg | ORAL_CAPSULE | Freq: Every evening | ORAL | 3 refills | Status: DC | PRN

## 2022-10-09 NOTE — Patient Instructions (Addendum)
Thank you for coming in today.   Please get an Xray today before you leave   Try the gabapentin at bedtime as needed for nerve pain.   If you get really bad ok to try prednisone. Let me know if you need it.   The [place] L Protocol for Hamstring Strains  Lyman Bishop PT  Just do the first exercise.   For the knee try voltaren gel up to 4x daily.  Please use Voltaren gel (Generic Diclofenac Gel) up to 4x daily for pain as needed.  This is available over-the-counter as both the name brand Voltaren gel and the generic diclofenac gel.

## 2022-10-13 NOTE — Progress Notes (Signed)
  Care Coordination  Outreach Note  10/13/2022 Name: Rhonda Morrow MRN: [id number] DOB: 18-Oct-1961   Care Coordination Outreach Attempts: A second unsuccessful outreach was attempted today to offer the patient with information about available care coordination services as a benefit of their health plan.     Follow Up Plan:  No further outreach attempts will be made at this time. We have been unable to contact the patient to offer or enroll patient in care coordination services  Encounter Outcome:  No Answer  Julian Hy, [place] Direct Dial: [phone]

## 2022-10-17 ENCOUNTER — Ambulatory Visit (HOSPITAL_BASED_OUTPATIENT_CLINIC_OR_DEPARTMENT_OTHER)
Admission: RE | Admit: 2022-10-17 | Discharge: 2022-10-17 | Disposition: A | Discharge status: Home / Self Care | Payer: Federal, State, Local not specified - PPO | Source: Ambulatory Visit | Attending: Family Medicine | Admitting: Family Medicine

## 2022-10-17 ENCOUNTER — Encounter (HOSPITAL_BASED_OUTPATIENT_CLINIC_OR_DEPARTMENT_OTHER): Payer: Self-pay

## 2022-10-17 DIAGNOSIS — Z1231 Encounter for screening mammogram for malignant neoplasm of breast: Secondary | ICD-10-CM | POA: Diagnosis not present

## 2022-11-13 ENCOUNTER — Encounter: Payer: Self-pay | Admitting: Obstetrics and Gynecology

## 2022-11-13 ENCOUNTER — Other Ambulatory Visit (HOSPITAL_COMMUNITY)
Admission: RE | Admit: 2022-11-13 | Discharge: 2022-11-13 | Disposition: A | Discharge status: Home / Self Care | Payer: Federal, State, Local not specified - PPO | Source: Ambulatory Visit | Attending: Obstetrics and Gynecology | Admitting: Obstetrics and Gynecology

## 2022-11-13 ENCOUNTER — Encounter: Payer: Self-pay | Admitting: General Practice

## 2022-11-13 ENCOUNTER — Ambulatory Visit: Payer: Federal, State, Local not specified - PPO | Admitting: Obstetrics and Gynecology

## 2022-11-13 VITALS — BP 156/28 | HR 42 | Wt 217.0 lb

## 2022-11-13 DIAGNOSIS — Z01419 Encounter for gynecological examination (general) (routine) without abnormal findings: Secondary | ICD-10-CM | POA: Insufficient documentation

## 2022-11-13 DIAGNOSIS — N72 Inflammatory disease of cervix uteri: Secondary | ICD-10-CM | POA: Diagnosis not present

## 2022-11-13 DIAGNOSIS — N950 Postmenopausal bleeding: Secondary | ICD-10-CM | POA: Diagnosis not present

## 2022-11-13 NOTE — Progress Notes (Addendum)
NEW GYNECOLOGY PATIENT Patient name: Rhonda Morrow MRN [id number]  Date of birth: 01/23/1961 Chief Complaint:   No chief complaint on file.     History:  Rhonda Morrow is a 62 y.o. [phone] being seen today for postmenopausal bleeding.    Last menses was several years ago. In November had lower abdominal cramping along with bleeding that was similar to contractions. The bleeding last for nearly 1 week and has not returned since. Since the bleeding has had continued lower abdominal pain when she has a BM. She denies blood in her stool. No urinary issues - denies dysuria and hematuria. She does not engage in penetrative intercourse but engages in non-penetrative masturbation without bleeding. No abnormal vaginal discharge that she has noticed.      Gynecologic History Patient's last menstrual period was 05/20/2012. Contraception: abstinence and post menopausal status Last Pap: 02/2020. Result was normal with negative HPV Last Mammogram: 10/2022.  Result was normal Last Colonoscopy: 09/2013.  Result was normal  Obstetric History OB History  Gravida Para Term Preterm AB Living  '3 3 2 1   3  '$ SAB IAB Ectopic Multiple Live Births          3    # Outcome Date GA Lbr Len/2nd Weight Sex Delivery Anes PTL Lv  3 Term 1998 [redacted]w[redacted]d  M VBAC None N LIV  2 Preterm 1996 345w0d  CS-LTranv EPI Y LIV  1 Term 1991 [id number]  EPI N LIV    Past Medical History:  Diagnosis Date   Arthritis    Asthma    Complication of anesthesia    one time woke up and was vey scared,17 yrs ago   Deaf    [place]  GERD (gastroesophageal reflux disease)    Hypertension    Left groin pain    Nausea with vomiting 02/19/2014   Thyroid disease     Past Surgical History:  Procedure Laterality Date   CARDIAC CATHETERIZATION     CESAREAN SECTION     CHOLECYSTECTOMY N/A 06/20/2016   Procedure: LAPAROSCOPIC CHOLECYSTECTOMY;  Surgeon: MatRolm BookbinderD;  Location: [facility];   Service: General;  Laterality: N/A;   ESOPHAGEAL MANOMETRY N/A 09/29/2013   Procedure: ESOPHAGEAL MANOMETRY (EM);  Surgeon: DanMilus BanisterD;  [facility];  Service: Endoscopy;  Laterality: N/A;   TOTAL HIP ARTHROPLASTY Left 02/21/2017   Procedure: LEFT TOTAL HIP ARTHROPLASTY ANTERIOR APPROACH;  Surgeon: NaiLeandrew KoyanagiD;  [facility];  Laterality: Left;    Current Outpatient Medications on File Prior to Visit  Medication Sig Dispense Refill   albuterol (VENTOLIN HFA) 108 (90 Base) MCG/ACT inhaler Inhale 2 puffs into the lungs every 6 (six) hours as needed for wheezing or shortness of breath. 8 g 6   allopurinol (ZYLOPRIM) 100 MG tablet TAKE 1 TABLET BY MOUTH EVERY DAY 90 tablet 3   amLODipine (NORVASC) 5 MG tablet TAKE 1 TABLET (5 MG TOTAL) BY MOUTH DAILY. 90 tablet 1   Azelastine HCl 137 MCG/SPRAY SOLN PLACE 2 SPRAYS INTO BOTH NOSTRILS 2 (TWO) TIMES DAILY. USE IN EACH NOSTRIL AS DIRECTED 30 mL 3   Budeson-Glycopyrrol-Formoterol (BREZTRI AEROSPHERE) 160-9-4.8 MCG/ACT AERO Inhale 2 puffs into the lungs in the morning and at bedtime. 1 each 11   Budeson-Glycopyrrol-Formoterol (BREZTRI AEROSPHERE) 160-9-4.8 MCG/ACT AERO Inhale 2 puffs into the lungs in the morning and at bedtime. 10.7 g 0  cetirizine (ZYRTEC) 10 MG tablet TAKE 1 TABLET BY MOUTH EVERY DAY 90 tablet 1   meloxicam (MOBIC) 7.5 MG tablet TAKE 1 TO 2 TABLETS BY MOUTH DAILY AS NEEDED 60 tablet 1   metFORMIN (GLUCOPHAGE-XR) 500 MG 24 hr tablet TAKE 2 TABLETS BY MOUTH EVERY DAY WITH BREAKFAST 180 tablet 1   omeprazole (PRILOSEC) 20 MG capsule TAKE 1 CAPSULE (20 MG TOTAL) BY MOUTH 2 (TWO) TIMES DAILY BEFORE A MEAL. 180 capsule 1   rosuvastatin (CRESTOR) 20 MG tablet TAKE 1 TABLET BY MOUTH EVERYDAY AT BEDTIME 90 tablet 0   blood glucose meter kit and supplies KIT Dispense based on patient and insurance preference. Use up to four times daily as directed. (FOR ICD-9 250.00, 250.01). (Patient not taking:  Reported on 11/13/2022) 1 each 0   COVID-19 mRNA vaccine 2023-2024 (COMIRNATY) syringe Inject into the muscle. (Patient not taking: Reported on 11/13/2022) 0.3 mL 0   cyclobenzaprine (FLEXERIL) 10 MG tablet Take 1 tablet (10 mg total) by mouth 3 (three) times daily as needed for muscle spasms. (Patient not taking: Reported on 11/13/2022) 30 tablet 0   gabapentin (NEURONTIN) 100 MG capsule Take 1-3 capsules (100-300 mg total) by mouth at bedtime as needed (nerve pain at bedtime). (Patient not taking: Reported on 11/13/2022) 60 capsule 3   promethazine-dextromethorphan (PROMETHAZINE-DM) 6.25-15 MG/5ML syrup Take 5 mLs by mouth 4 (four) times daily as needed. (Patient not taking: Reported on 11/13/2022) 118 mL 0   Current Facility-Administered Medications on File Prior to Visit  Medication Dose Route Frequency Provider Last Rate Last Admin   alum & mag hydroxide-simeth (MAALOX/MYLANTA) 200-200-20 MG/5ML suspension 30 mL  30 mL Oral Once Saguier, Edward, PA-C       lidocaine (XYLOCAINE) 2 % viscous mouth solution 15 mL  15 mL Mouth/Throat Once Saguier, Edward, PA-C       nitroGLYCERIN (NITRODUR - Dosed in mg/24 hr) patch 0.2 mg  0.2 mg Transdermal Daily Regal, Tamala Fothergill, DPM        Allergies  Allergen Reactions   Losartan Shortness Of Breath   Oxycodone-Acetaminophen Nausea And Vomiting   Augmentin [Amoxicillin-Pot Clavulanate] Diarrhea    Social History:  reports that she has never smoked. She has never used smokeless tobacco. She reports that she does not drink alcohol and does not use drugs.  Family History  Problem Relation Age of Onset   Diabetes Mother    Heart disease Mother    Diabetes Father     The following portions of the patient's history were reviewed and updated as appropriate: allergies, current medications, past family history, past medical history, past social history, past surgical history and problem list.  Review of Systems Pertinent items noted in HPI and remainder of  comprehensive ROS otherwise negative.  Physical Exam:  BP (!) 156/28   Pulse (!) 42   Wt 217 lb (98.4 kg)   LMP 05/20/2012   BMI 39.69 kg/m  Physical Exam Vitals and nursing note reviewed. Exam conducted with a chaperone present.  Constitutional:      Appearance: Normal appearance.  Cardiovascular:     Rate and Rhythm: Normal rate.  Pulmonary:     Effort: Pulmonary effort is normal.     Breath sounds: Normal breath sounds.  Chest:  Breasts:    Right: No bleeding, inverted nipple, nipple discharge or skin change.     Left: No bleeding, inverted nipple, nipple discharge or skin change.     Comments: Mild tenderness of bilateral inner breasts Abdominal:  Palpations: Abdomen is soft.     Comments: Suprapubic tenderness  Genitourinary:    General: Normal vulva.     Vagina: Normal.     Cervix: Normal.     Comments: Atrophic introitus and vaginal canal  Normal, atrophic introitus without masses Neurological:     General: No focal deficit present.     Mental Status: She is alert and oriented to person, place, and time.  Psychiatric:        Mood and Affect: Mood normal.        Behavior: Behavior normal.        Thought Content: Thought content normal.        Judgment: Judgment normal.    MM 3D SCREEN BREAST BILATERAL CLINICAL DATA:  Screening.  EXAM: DIGITAL SCREENING BILATERAL MAMMOGRAM WITH TOMOSYNTHESIS AND CAD  TECHNIQUE: Bilateral screening digital craniocaudal and mediolateral oblique mammograms were obtained. Bilateral screening digital breast tomosynthesis was performed. The images were evaluated with computer-aided detection.  COMPARISON:  Previous exam(s).  ACR Breast Density Category a: The breast tissue is almost entirely fatty.  FINDINGS: There are no findings suspicious for malignancy.  IMPRESSION: No mammographic evidence of malignancy. A result letter of this screening mammogram will be mailed directly to the  patient.  RECOMMENDATION: Screening mammogram in one year. (Code:SM-B-01Y)  BI-RADS CATEGORY  1: Negative.  Electronically Signed   By: Marin Olp M.D.   On: 10/18/2022 10:02    Endometrial Biopsy Procedure  Patient identified, informed consent performed,  indication reviewed, consent signed.  Reviewed risk of perforation, pain, bleeding, insufficient sample, etc were reviewd. Time out was performed.  Urine pregnancy test negative.  Speculum placed in the vagina.  Cervix visualized.  Cleaned with Betadine x 2.   Paracervical block was not administered and the endocervical canal instilled with using 1% lidocaine.  Endometrial pipelle was used to draw up 1cc of 1% lidocaine, introduced into the cervical os and instilled into the endometrial cavity.  The pipelle was passed once without difficulty and sample obtained. Tenaculum was removed, good hemostasis noted.  Patient tolerated procedure well.  Patient was given post-procedure instructions.    Assessment and Plan:   1. Well woman exam with routine gynecological exam Routine pap collected. Referral to GI for colonoscopy due this year. Mammo up to date.  - Cytology - PAP( [facility]) - Ambulatory referral to Gastroenterology - Urine Culture  2. PMB (postmenopausal bleeding) Now s/p uncomplicated EMB for PMB and pelvic pain. Reviewed various causes of PMB. Will also follow up urine.  - Surgical pathology   Routine preventative health maintenance measures emphasized. Please refer to After Visit Summary for other counseling recommendations.   Follow-up: No follow-ups on file.      Darliss Cheney, MD Obstetrician & Gynecologist, [facility] [employer], [place]

## 2022-11-13 NOTE — Addendum Note (Signed)
Addended by: Cindi Carbon on: 11/13/2022 02:22 PM   Modules accepted: Orders

## 2022-11-13 NOTE — Progress Notes (Signed)
[facility] Sign language interpreter Clyde Canterbury.

## 2022-11-14 LAB — CYTOLOGY - PAP
Comment: NEGATIVE
Diagnosis: NEGATIVE
High risk HPV: NEGATIVE

## 2022-11-14 LAB — SURGICAL PATHOLOGY

## 2022-11-15 LAB — URINE CULTURE

## 2022-11-16 ENCOUNTER — Other Ambulatory Visit: Payer: Self-pay | Admitting: Family Medicine

## 2022-11-20 ENCOUNTER — Encounter: Payer: Self-pay | Admitting: Family Medicine

## 2022-11-20 ENCOUNTER — Ambulatory Visit (INDEPENDENT_AMBULATORY_CARE_PROVIDER_SITE_OTHER): Payer: Federal, State, Local not specified - PPO | Admitting: Family Medicine

## 2022-11-20 ENCOUNTER — Telehealth: Payer: Self-pay | Admitting: Family Medicine

## 2022-11-20 ENCOUNTER — Ambulatory Visit (HOSPITAL_BASED_OUTPATIENT_CLINIC_OR_DEPARTMENT_OTHER)
Admission: RE | Admit: 2022-11-20 | Discharge: 2022-11-20 | Disposition: A | Discharge status: Home / Self Care | Payer: Federal, State, Local not specified - PPO | Source: Ambulatory Visit | Attending: Family Medicine | Admitting: Family Medicine

## 2022-11-20 VITALS — BP 120/90 | HR 101 | Temp 102.3°F | Resp 20 | Ht 62.0 in | Wt 218.0 lb

## 2022-11-20 DIAGNOSIS — R051 Acute cough: Secondary | ICD-10-CM | POA: Diagnosis not present

## 2022-11-20 DIAGNOSIS — R509 Fever, unspecified: Secondary | ICD-10-CM | POA: Diagnosis not present

## 2022-11-20 DIAGNOSIS — J40 Bronchitis, not specified as acute or chronic: Secondary | ICD-10-CM | POA: Diagnosis not present

## 2022-11-20 DIAGNOSIS — R059 Cough, unspecified: Secondary | ICD-10-CM | POA: Diagnosis not present

## 2022-11-20 DIAGNOSIS — J111 Influenza due to unidentified influenza virus with other respiratory manifestations: Secondary | ICD-10-CM | POA: Diagnosis not present

## 2022-11-20 LAB — COMPREHENSIVE METABOLIC PANEL
ALT: 17 U/L (ref 0–35)
AST: 23 U/L (ref 0–37)
Albumin: 4 g/dL (ref 3.5–5.2)
Alkaline Phosphatase: 65 U/L (ref 39–117)
BUN: 11 mg/dL (ref 6–23)
CO2: 30 mEq/L (ref 19–32)
Calcium: 9.3 mg/dL (ref 8.4–10.5)
Chloride: 99 mEq/L (ref 96–112)
Creatinine, Ser: 0.91 mg/dL (ref 0.40–1.20)
GFR: 69.21 mL/min (ref >60.00)
Glucose, Bld: 143 mg/dL — ABNORMAL HIGH (ref 70–99)
Potassium: 3.2 mEq/L — ABNORMAL LOW (ref 3.5–5.1)
Sodium: 140 mEq/L (ref 135–145)
Total Bilirubin: 0.8 mg/dL (ref 0.2–1.2)
Total Protein: 7 g/dL (ref 6.0–8.3)

## 2022-11-20 LAB — CBC WITH DIFFERENTIAL/PLATELET
Basophils Absolute: 0 10*3/uL (ref 0.0–0.1)
Basophils Relative: 0.6 % (ref 0.0–3.0)
Eosinophils Absolute: 0 10*3/uL (ref 0.0–0.7)
Eosinophils Relative: 0.6 % (ref 0.0–5.0)
HCT: 44.8 % (ref 36.0–46.0)
Hemoglobin: 14.8 g/dL (ref 12.0–15.0)
Lymphocytes Relative: 5.2 % — ABNORMAL LOW (ref 12.0–46.0)
Lymphs Abs: 0.4 10*3/uL — ABNORMAL LOW (ref 0.7–4.0)
MCHC: 33.2 g/dL (ref 30.0–36.0)
MCV: 83 fl (ref 78.0–100.0)
Monocytes Absolute: 0.7 10*3/uL (ref 0.1–1.0)
Monocytes Relative: 8.7 % (ref 3.0–12.0)
Neutro Abs: 6.8 10*3/uL (ref 1.4–7.7)
Neutrophils Relative %: 84.9 % — ABNORMAL HIGH (ref 43.0–77.0)
Platelets: 159 10*3/uL (ref 150.0–400.0)
RBC: 5.39 Mil/uL — ABNORMAL HIGH (ref 3.87–5.11)
RDW: 15.1 % (ref 11.5–15.5)
WBC: 8 10*3/uL (ref 4.0–10.5)

## 2022-11-20 LAB — POC INFLUENZA A&B (BINAX/QUICKVUE)
Influenza A, POC: NEGATIVE
Influenza B, POC: NEGATIVE

## 2022-11-20 LAB — POC COVID19 BINAXNOW: SARS Coronavirus 2 Ag: NEGATIVE

## 2022-11-20 MED ORDER — PROMETHAZINE-DM 6.25-15 MG/5ML PO SYRP: 5.0000 mL | ORAL_SOLUTION | Freq: Four times a day (QID) | ORAL | 0 refills | Status: DC | PRN

## 2022-11-20 MED ORDER — AZITHROMYCIN 250 MG PO TABS: ORAL_TABLET | ORAL | 0 refills | Status: DC

## 2022-11-20 MED ORDER — OSELTAMIVIR PHOSPHATE 75 MG PO CAPS: 75.0000 mg | ORAL_CAPSULE | Freq: Two times a day (BID) | ORAL | 0 refills | Status: DC

## 2022-11-20 NOTE — Patient Instructions (Signed)

## 2022-11-20 NOTE — Telephone Encounter (Signed)
Jessica with interpreter services called to advise that due to the short notice they do not have anyone available to interpret for visit so she wanted to let us know so we can be prepared to use the VRI system.

## 2022-11-20 NOTE — Progress Notes (Signed)
Subjective:   By signing my name below, I, Shehryar Baig, attest that this documentation has been prepared under the direction and in the presence of Ann Held, DO. 11/20/2022   Patient ID: Rhonda Morrow, female    DOB: 08-26-1961, 62 y.o.   MRN: [id number]  Chief Complaint  Patient presents with   Cough    Sxs since 11/18/2021, no COVID test, pt having cough, nausea/vomiting, fatigue.     Cough Associated symptoms include chills and a fever.   Patient is in today for a office visit.   She complains of dizziness and cough since 11/18/2022. She also has dry mouth, fever, and chills. She is coughing up yellow mucous. She is trying to drink more water. Her son has similar symptoms at home. She is not taking tylenol or ibuprofen at home.  She is requesting to renew her FMLA.    Past Medical History:  Diagnosis Date   Arthritis    Asthma    Complication of anesthesia    one time woke up and was vey scared,17 yrs ago   Deaf    Diabetes mellitus without complication ([place])    GERD (gastroesophageal reflux disease)    Hypertension    Left groin pain    Nausea with vomiting 02/19/2014   Thyroid disease     Past Surgical History:  Procedure Laterality Date   CARDIAC CATHETERIZATION     CESAREAN SECTION     CHOLECYSTECTOMY N/A 06/20/2016   Procedure: LAPAROSCOPIC CHOLECYSTECTOMY;  Surgeon: Rolm Bookbinder, MD;  Location: [facility];  Service: General;  Laterality: N/A;   ESOPHAGEAL MANOMETRY N/A 09/29/2013   Procedure: ESOPHAGEAL MANOMETRY (EM);  Surgeon: Milus Banister, MD;  [facility];  Service: Endoscopy;  Laterality: N/A;   TOTAL HIP ARTHROPLASTY Left 02/21/2017   Procedure: LEFT TOTAL HIP ARTHROPLASTY ANTERIOR APPROACH;  Surgeon: Leandrew Koyanagi, MD;  Location: [facility];  Service: Orthopedics;  Laterality: Left;    Family History  Problem Relation Age of Onset   Diabetes Mother    Heart disease Mother    Diabetes Father     Social History    Socioeconomic History   Marital status: Married    Spouse name: Not on file   Number of children: Not on file   Years of education: Not on file   Highest education level: Not on file  Occupational History   Occupation: disabled  Tobacco Use   Smoking status: Never   Smokeless tobacco: Never  Vaping Use   Vaping Use: Never used  Substance and Sexual Activity   Alcohol use: No    Alcohol/week: 0.0 standard drinks of alcohol   Drug use: No   Sexual activity: Not Currently  Other Topics Concern   Not on file  Social History Narrative   Lives with family.  Has 3 children.  Works for [employer].  Education: high school.   Social Determinants of Health   Financial Resource Strain: Not on file  Food Insecurity: Not on file  Transportation Needs: Not on file  Physical Activity: Not on file  Stress: Not on file  Social Connections: Not on file  Intimate Partner Violence: Not on file    Outpatient Medications Prior to Visit  Medication Sig Dispense Refill   albuterol (VENTOLIN HFA) 108 (90 Base) MCG/ACT inhaler Inhale 2 puffs into the lungs every 6 (six) hours as needed for wheezing or shortness of breath. 8 g 6   allopurinol (ZYLOPRIM) 100 MG tablet  TAKE 1 TABLET BY MOUTH EVERY DAY 90 tablet 3   amLODipine (NORVASC) 5 MG tablet TAKE 1 TABLET (5 MG TOTAL) BY MOUTH DAILY. 90 tablet 1   Azelastine HCl 137 MCG/SPRAY SOLN PLACE 2 SPRAYS INTO BOTH NOSTRILS 2 (TWO) TIMES DAILY. USE IN EACH NOSTRIL AS DIRECTED 30 mL 3   Budeson-Glycopyrrol-Formoterol (BREZTRI AEROSPHERE) 160-9-4.8 MCG/ACT AERO Inhale 2 puffs into the lungs in the morning and at bedtime. 1 each 11   Budeson-Glycopyrrol-Formoterol (BREZTRI AEROSPHERE) 160-9-4.8 MCG/ACT AERO Inhale 2 puffs into the lungs in the morning and at bedtime. 10.7 g 0   cetirizine (ZYRTEC) 10 MG tablet TAKE 1 TABLET BY MOUTH EVERY DAY 90 tablet 1   cyclobenzaprine (FLEXERIL) 10 MG tablet Take 1 tablet (10 mg total) by mouth 3 (three) times daily as  needed for muscle spasms. 30 tablet 0   meloxicam (MOBIC) 7.5 MG tablet TAKE 1 TO 2 TABLETS BY MOUTH DAILY AS NEEDED 60 tablet 1   metFORMIN (GLUCOPHAGE-XR) 500 MG 24 hr tablet TAKE 2 TABLETS BY MOUTH EVERY DAY WITH BREAKFAST 180 tablet 1   omeprazole (PRILOSEC) 20 MG capsule TAKE 1 CAPSULE (20 MG TOTAL) BY MOUTH 2 (TWO) TIMES DAILY BEFORE A MEAL. 180 capsule 1   rosuvastatin (CRESTOR) 20 MG tablet TAKE 1 TABLET BY MOUTH EVERYDAY AT BEDTIME 90 tablet 0   blood glucose meter kit and supplies KIT Dispense based on patient and insurance preference. Use up to four times daily as directed. (FOR ICD-9 250.00, 250.01). (Patient not taking: Reported on 11/13/2022) 1 each 0   COVID-19 mRNA vaccine 2023-2024 (COMIRNATY) syringe Inject into the muscle. (Patient not taking: Reported on 11/13/2022) 0.3 mL 0   gabapentin (NEURONTIN) 100 MG capsule Take 1-3 capsules (100-300 mg total) by mouth at bedtime as needed (nerve pain at bedtime). (Patient not taking: Reported on 11/13/2022) 60 capsule 3   promethazine-dextromethorphan (PROMETHAZINE-DM) 6.25-15 MG/5ML syrup Take 5 mLs by mouth 4 (four) times daily as needed. (Patient not taking: Reported on 11/13/2022) 118 mL 0   Facility-Administered Medications Prior to Visit  Medication Dose Route Frequency Provider Last Rate Last Admin   alum & mag hydroxide-simeth (MAALOX/MYLANTA) 200-200-20 MG/5ML suspension 30 mL  30 mL Oral Once Saguier, Edward, PA-C       lidocaine (XYLOCAINE) 2 % viscous mouth solution 15 mL  15 mL Mouth/Throat Once Saguier, Edward, PA-C       nitroGLYCERIN (NITRODUR - Dosed in mg/24 hr) patch 0.2 mg  0.2 mg Transdermal Daily Regal, Tamala Fothergill, DPM        Allergies  Allergen Reactions   Losartan Shortness Of Breath   Oxycodone-Acetaminophen Nausea And Vomiting   Augmentin [Amoxicillin-Pot Clavulanate] Diarrhea    Review of Systems  Constitutional:  Positive for chills and fever.  HENT:         (+)dry mouth  Respiratory:  Positive for cough and  sputum production (yellow).   Neurological:  Positive for dizziness.       Objective:    Physical Exam Constitutional:      General: She is not in acute distress.    Appearance: Normal appearance. She is not ill-appearing.  HENT:     Head: Normocephalic and atraumatic.     Right Ear: External ear normal.     Left Ear: External ear normal.  Eyes:     Extraocular Movements: Extraocular movements intact.     Pupils: Pupils are equal, round, and reactive to light.  Cardiovascular:     Rate and  Rhythm: Normal rate and regular rhythm.     Heart sounds: Normal heart sounds. No murmur heard.    No gallop.  Pulmonary:     Effort: Pulmonary effort is normal. No respiratory distress.     Breath sounds: Rales present. No wheezing.  Skin:    General: Skin is warm and dry.  Neurological:     Mental Status: She is alert and oriented to person, place, and time.  Psychiatric:        Judgment: Judgment normal.     BP (!) 120/90 (BP Location: Left Arm, Patient Position: Sitting, Cuff Size: Large)   Pulse (!) 101   Temp (!) 102.3 F (39.1 C) (Oral)   Resp 20   Ht '5\' 2"'$  (1.575 m)   Wt 218 lb (98.9 kg)   LMP 05/20/2012   SpO2 96%   BMI 39.87 kg/m  Wt Readings from Last 3 Encounters:  11/20/22 218 lb (98.9 kg)  11/13/22 217 lb (98.4 kg)  10/09/22 219 lb (99.3 kg)       Assessment & Plan:  Fever, unspecified fever cause -     POC Influenza A&B(BINAX/QUICKVUE) -     POC COVID-19 BinaxNow -     Comprehensive metabolic panel -     CBC with Differential/Platelet  Acute cough -     DG Chest 2 View; Future -     Promethazine-DM; Take 5 mLs by mouth 4 (four) times daily as needed.  Dispense: 118 mL; Refill: 0  Bronchitis -     Azithromycin; As directed  Dispense: 6 each; Refill: 0 -     Comprehensive metabolic panel -     CBC with Differential/Platelet  Influenza Assessment & Plan: Poc was neg but clinically pt has flu Tamilflu prescribed   Orders: -     Oseltamivir  Phosphate; Take 1 capsule (75 mg total) by mouth 2 (two) times daily.  Dispense: 10 capsule; Refill: 0    I, Ann Held, DO, personally preformed the services described in this documentation.  All medical record entries made by the scribe were at my direction and in my presence.  I have reviewed the chart and discharge instructions (if applicable) and agree that the record reflects my personal performance and is accurate and complete. 11/20/2022   I,Shehryar Baig,acting as a scribe for Ann Held, DO.,have documented all relevant documentation on the behalf of Ann Held, DO,as directed by  Ann Held, DO while in the presence of Ann Held, DO.   Ann Held, DO

## 2022-11-20 NOTE — Assessment & Plan Note (Signed)
Poc was neg but clinically pt has flu Tamilflu prescribed

## 2022-11-20 NOTE — Telephone Encounter (Signed)
FYI

## 2022-11-22 ENCOUNTER — Telehealth: Payer: Self-pay | Admitting: Family Medicine

## 2022-11-22 NOTE — Telephone Encounter (Signed)
Sxs are not improving and test was negative

## 2022-11-22 NOTE — Telephone Encounter (Signed)
Pt called with sign language interpreter stating that she wanted to discuss her symptoms with Dr. Etter Sjogren over a phone call. She stated that she has a persistent cough and a sore throat but both covid and flu tests were negative. Offered appt to see another provider today or see if Dr. Etter Sjogren had availability later in the week. Pt stated she would rather much have a phone call. Advise pt that a note would be sent back to look into this. Pt acknowledged understanding.

## 2022-11-23 MED ORDER — LEVOFLOXACIN 500 MG PO TABS: 500.0000 mg | ORAL_TABLET | Freq: Every day | ORAL | 0 refills | Status: AC

## 2022-11-23 NOTE — Telephone Encounter (Signed)
Per Dr. Etter Sjogren-  I would like to repeat the cbcd  and please send in levaquin 500 mg 1 po qd x 7 days and office visit tomorrow     Tried calling Pt- no answer. Rx for Levaquin sent.

## 2022-11-27 ENCOUNTER — Ambulatory Visit (INDEPENDENT_AMBULATORY_CARE_PROVIDER_SITE_OTHER): Payer: Federal, State, Local not specified - PPO | Admitting: Family Medicine

## 2022-11-27 ENCOUNTER — Encounter: Payer: Self-pay | Admitting: Family Medicine

## 2022-11-27 VITALS — BP 126/80 | HR 69 | Temp 97.6°F | Resp 18 | Ht 62.0 in | Wt 212.4 lb

## 2022-11-27 DIAGNOSIS — J40 Bronchitis, not specified as acute or chronic: Secondary | ICD-10-CM | POA: Diagnosis not present

## 2022-11-27 MED ORDER — AZITHROMYCIN 250 MG PO TABS: ORAL_TABLET | ORAL | 0 refills | Status: DC

## 2022-11-27 MED ORDER — PREDNISONE 10 MG PO TABS: ORAL_TABLET | ORAL | 0 refills | Status: DC

## 2022-11-27 NOTE — Patient Instructions (Addendum)
Pulse ox ---- this would be a good thing for you to have ---you can find them on amazon    Acute Bronchitis, Adult  Acute bronchitis is sudden inflammation of the main airways (bronchi) that come off the windpipe (trachea) in the lungs. The swelling causes the airways to get smaller and make more mucus than normal. This can make it hard to breathe and can cause coughing or noisy breathing (wheezing). Acute bronchitis may last several weeks. The cough may last longer. Allergies, asthma, and exposure to smoke may make the condition worse. What are the causes? This condition can be caused by germs and by substances that irritate the lungs, including: Cold and flu viruses. The most common cause of this condition is the virus that causes the common cold. Bacteria. This is less common. Breathing in substances that irritate the lungs, including: Smoke from cigarettes and other forms of tobacco. Dust and pollen. Fumes from household cleaning products, gases, or burned fuel. Indoor or outdoor air pollution. What increases the risk? The following factors may make you more likely to develop this condition: A weak body's defense system, also called the immune system. A condition that affects your lungs and breathing, such as asthma. What are the signs or symptoms? Common symptoms of this condition include: Coughing. This may bring up clear, yellow, or green mucus from your lungs (sputum). Wheezing. Runny or stuffy nose. Having too much mucus in your lungs (chest congestion). Shortness of breath. Aches and pains, including sore throat or chest. How is this diagnosed? This condition is usually diagnosed based on: Your symptoms and medical history. A physical exam. You may also have other tests, including tests to rule out other conditions, such as pneumonia. These tests include: A test of lung function. Test of a mucus sample to look for the presence of bacteria. Tests to check the oxygen level  in your blood. Blood tests. Chest X-ray. How is this treated? Most cases of acute bronchitis clear up over time without treatment. Your health care provider may recommend: Drinking more fluids to help thin your mucus so it is easier to cough up. Taking inhaled medicine (inhaler) to improve air flow in and out of your lungs. Using a vaporizer or a humidifier. These are machines that add water to the air to help you breathe better. Taking a medicine that thins mucus and clears congestion (expectorant). Taking a medicine that prevents or stops coughing (cough suppressant). It is not common to take an antibiotic medicine for this condition. Follow these instructions at home:  Take over-the-counter and prescription medicines only as told by your health care provider. Use an inhaler, vaporizer, or humidifier as told by your health care provider. Take two teaspoons (10 mL) of honey at bedtime to lessen coughing at night. Drink enough fluid to keep your urine pale yellow. Do not use any products that contain nicotine or tobacco. These products include cigarettes, chewing tobacco, and vaping devices, such as e-cigarettes. If you need help quitting, ask your health care provider. Get plenty of rest. Return to your normal activities as told by your health care provider. Ask your health care provider what activities are safe for you. Keep all follow-up visits. This is important. How is this prevented? To lower your risk of getting this condition again: Wash your hands often with soap and water for at least 20 seconds. If soap and water are not available, use hand sanitizer. Avoid contact with people who have cold symptoms. Try not to touch  your mouth, nose, or eyes with your hands. Avoid breathing in smoke or chemical fumes. Breathing smoke or chemical fumes will make your condition worse. Get the flu shot every year. Contact a health care provider if: Your symptoms do not improve after 2  weeks. You have trouble coughing up the mucus. Your cough keeps you awake at night. You have a fever. Get help right away if you: Cough up blood. Feel pain in your chest. Have severe shortness of breath. Faint or keep feeling like you are going to faint. Have a severe headache. Have a fever or chills that get worse. These symptoms may represent a serious problem that is an emergency. Do not wait to see if the symptoms will go away. Get medical help right away. Call your local emergency services (911 in the [place] Do not drive yourself to the hospital. Summary Acute bronchitis is inflammation of the main airways (bronchi) that come off the windpipe (trachea) in the lungs. The swelling causes the airways to get smaller and make more mucus than normal. Drinking more fluids can help thin your mucus so it is easier to cough up. Take over-the-counter and prescription medicines only as told by your health care provider. Do not use any products that contain nicotine or tobacco. These products include cigarettes, chewing tobacco, and vaping devices, such as e-cigarettes. If you need help quitting, ask your health care provider. Contact a health care provider if your symptoms do not improve after 2 weeks. This information is not intended to replace advice given to you by your health care provider. Make sure you discuss any questions you have with your health care provider. Document Revised: 02/02/2022 Document Reviewed: 02/23/2021 Elsevier Patient Education  [place].

## 2022-11-27 NOTE — Progress Notes (Signed)
Subjective:   By signing my name below, I, Marlana Latus, attest that this documentation has been prepared under the direction and in the presence of Ann Held. DO 11/27/2022   Patient ID: Rhonda Morrow, female    DOB: [birth year], 62 y.o.   MRN: [id number]  Chief Complaint  Patient presents with   Cough   Follow-up    HPI Patient is in today for a sick visit. She is accompanied by her husband and a medical interpreter.   She complains of a persistent productive cough, sore throat, fever, and post nasal drip. She has frontal sinus pressure when she coughs. She reports that her fever has resolved since first getting sick. She describes a tickle in her lungs from post nasal drip that resolves with drinking water. She has been taking Mucinex to relieve symptoms.   She denies having any new muscle pain, new joint pain, new moles, congestion, chest pain, palpitations, SOB, wheezing, n/v/d, constipation, blood in stool, dysuria, frequency, hematuria, or headaches at this time.  Past Medical History:  Diagnosis Date   Arthritis    Asthma    Complication of anesthesia    one time woke up and was vey scared,17 yrs ago   Deaf    Diabetes mellitus without complication ([place])    GERD (gastroesophageal reflux disease)    Hypertension    Left groin pain    Nausea with vomiting 02/19/2014   Thyroid disease     Past Surgical History:  Procedure Laterality Date   CARDIAC CATHETERIZATION     CESAREAN SECTION     CHOLECYSTECTOMY N/A 06/20/2016   Procedure: LAPAROSCOPIC CHOLECYSTECTOMY;  Surgeon: Rolm Bookbinder, MD;  Location: [facility];  Service: General;  Laterality: N/A;   ESOPHAGEAL MANOMETRY N/A 09/29/2013   Procedure: ESOPHAGEAL MANOMETRY (EM);  Surgeon: Milus Banister, MD;  Location: [facility];  Service: Endoscopy;  Laterality: N/A;   TOTAL HIP ARTHROPLASTY Left 02/21/2017   Procedure: LEFT TOTAL HIP ARTHROPLASTY ANTERIOR APPROACH;  Surgeon: Leandrew Koyanagi, MD;  Location: [facility];  Service: Orthopedics;  Laterality: Left;    Family History  Problem Relation Age of Onset   Diabetes Mother    Heart disease Mother    Diabetes Father     Social History   Socioeconomic History   Marital status: Married    Spouse name: Not on file   Number of children: Not on file   Years of education: Not on file   Highest education level: Not on file  Occupational History   Occupation: disabled  Tobacco Use   Smoking status: Never   Smokeless tobacco: Never  Vaping Use   Vaping Use: Never used  Substance and Sexual Activity   Alcohol use: No    Alcohol/week: 0.0 standard drinks of alcohol   Drug use: No   Sexual activity: Not Currently  Other Topics Concern   Not on file  Social History Narrative   Lives with family.  Has 3 children.  Works for [employer].  Education: high school.   Social Determinants of Health   Financial Resource Strain: Not on file  Food Insecurity: Not on file  Transportation Needs: Not on file  Physical Activity: Not on file  Stress: Not on file  Social Connections: Not on file  Intimate Partner Violence: Not on file    Outpatient Medications Prior to Visit  Medication Sig Dispense Refill   albuterol (VENTOLIN HFA) 108 (90 Base) MCG/ACT inhaler Inhale 2 puffs into  the lungs every 6 (six) hours as needed for wheezing or shortness of breath. 8 g 6   allopurinol (ZYLOPRIM) 100 MG tablet TAKE 1 TABLET BY MOUTH EVERY DAY 90 tablet 3   amLODipine (NORVASC) 5 MG tablet TAKE 1 TABLET (5 MG TOTAL) BY MOUTH DAILY. 90 tablet 1   Azelastine HCl 137 MCG/SPRAY SOLN PLACE 2 SPRAYS INTO BOTH NOSTRILS 2 (TWO) TIMES DAILY. USE IN EACH NOSTRIL AS DIRECTED 30 mL 3   azithromycin (ZITHROMAX Z-PAK) 250 MG tablet As directed 6 each 0   Budeson-Glycopyrrol-Formoterol (BREZTRI AEROSPHERE) 160-9-4.8 MCG/ACT AERO Inhale 2 puffs into the lungs in the morning and at bedtime. 1 each 11   Budeson-Glycopyrrol-Formoterol (BREZTRI AEROSPHERE) 160-9-4.8 MCG/ACT AERO  Inhale 2 puffs into the lungs in the morning and at bedtime. 10.7 g 0   cetirizine (ZYRTEC) 10 MG tablet TAKE 1 TABLET BY MOUTH EVERY DAY 90 tablet 1   cyclobenzaprine (FLEXERIL) 10 MG tablet Take 1 tablet (10 mg total) by mouth 3 (three) times daily as needed for muscle spasms. 30 tablet 0   levofloxacin (LEVAQUIN) 500 MG tablet Take 1 tablet (500 mg total) by mouth daily for 7 days. 7 tablet 0   meloxicam (MOBIC) 7.5 MG tablet TAKE 1 TO 2 TABLETS BY MOUTH DAILY AS NEEDED 60 tablet 1   metFORMIN (GLUCOPHAGE-XR) 500 MG 24 hr tablet TAKE 2 TABLETS BY MOUTH EVERY DAY WITH BREAKFAST 180 tablet 1   omeprazole (PRILOSEC) 20 MG capsule TAKE 1 CAPSULE (20 MG TOTAL) BY MOUTH 2 (TWO) TIMES DAILY BEFORE A MEAL. 180 capsule 1   oseltamivir (TAMIFLU) 75 MG capsule Take 1 capsule (75 mg total) by mouth 2 (two) times daily. 10 capsule 0   promethazine-dextromethorphan (PROMETHAZINE-DM) 6.25-15 MG/5ML syrup Take 5 mLs by mouth 4 (four) times daily as needed. 118 mL 0   rosuvastatin (CRESTOR) 20 MG tablet TAKE 1 TABLET BY MOUTH EVERYDAY AT BEDTIME 90 tablet 0   blood glucose meter kit and supplies KIT Dispense based on patient and insurance preference. Use up to four times daily as directed. (FOR ICD-9 250.00, 250.01). (Patient not taking: Reported on 11/13/2022) 1 each 0   COVID-19 mRNA vaccine 2023-2024 (COMIRNATY) syringe Inject into the muscle. (Patient not taking: Reported on 11/13/2022) 0.3 mL 0   gabapentin (NEURONTIN) 100 MG capsule Take 1-3 capsules (100-300 mg total) by mouth at bedtime as needed (nerve pain at bedtime). (Patient not taking: Reported on 11/13/2022) 60 capsule 3   promethazine-dextromethorphan (PROMETHAZINE-DM) 6.25-15 MG/5ML syrup Take 5 mLs by mouth 4 (four) times daily as needed. (Patient not taking: Reported on 11/13/2022) 118 mL 0   Facility-Administered Medications Prior to Visit  Medication Dose Route Frequency Provider Last Rate Last Admin   alum & mag hydroxide-simeth (MAALOX/MYLANTA)  200-200-20 MG/5ML suspension 30 mL  30 mL Oral Once Saguier, Edward, PA-C       lidocaine (XYLOCAINE) 2 % viscous mouth solution 15 mL  15 mL Mouth/Throat Once Saguier, Edward, PA-C       nitroGLYCERIN (NITRODUR - Dosed in mg/24 hr) patch 0.2 mg  0.2 mg Transdermal Daily Regal, Tamala Fothergill, DPM        Allergies  Allergen Reactions   Losartan Shortness Of Breath   Oxycodone-Acetaminophen Nausea And Vomiting   Augmentin [Amoxicillin-Pot Clavulanate] Diarrhea    Review of Systems  Constitutional:  Positive for fever (has resolved). Negative for malaise/fatigue.  HENT:  Positive for sinus pain (frontal sinus pain and pressure when coughing) and sore throat. Negative for  congestion.   Eyes:  Negative for blurred vision.  Respiratory:  Positive for cough (persistent and productive). Negative for shortness of breath.   Cardiovascular:  Negative for chest pain, palpitations and leg swelling.  Gastrointestinal:  Negative for abdominal pain, blood in stool and nausea.  Genitourinary:  Negative for dysuria and frequency.  Musculoskeletal:  Negative for falls.  Skin:  Negative for rash.  Neurological:  Negative for dizziness, loss of consciousness and headaches.  Endo/Heme/Allergies:  Negative for environmental allergies.  Psychiatric/Behavioral:  Negative for depression. The patient is not nervous/anxious.        Objective:    Physical Exam Vitals and nursing note reviewed.  Constitutional:      General: She is not in acute distress.    Appearance: Normal appearance.  HENT:     Head: Normocephalic and atraumatic.     Right Ear: External ear normal.     Left Ear: External ear normal.  Eyes:     Extraocular Movements: Extraocular movements intact.     Pupils: Pupils are equal, round, and reactive to light.  Cardiovascular:     Rate and Rhythm: Normal rate and regular rhythm.     Pulses: Normal pulses.     Heart sounds: Normal heart sounds. No murmur heard.    No gallop.  Pulmonary:      Effort: Pulmonary effort is normal. No respiratory distress.     Breath sounds: Normal breath sounds. No stridor. No wheezing or rhonchi.  Skin:    General: Skin is warm and dry.  Neurological:     Mental Status: She is alert and oriented to person, place, and time.  Psychiatric:        Judgment: Judgment normal.     BP 126/80 (BP Location: Right Arm, Patient Position: Sitting, Cuff Size: Normal)   Pulse 69   Temp 97.6 F (36.4 C) (Oral)   Resp 18   Ht '5\' 2"'$  (1.575 m)   Wt 212 lb 6.4 oz (96.3 kg)   LMP 05/20/2012   SpO2 99%   BMI 38.85 kg/m  Wt Readings from Last 3 Encounters:  11/27/22 212 lb 6.4 oz (96.3 kg)  11/20/22 218 lb (98.9 kg)  11/13/22 217 lb (98.4 kg)    Diabetic Foot Exam - Simple   No data filed    Lab Results  Component Value Date   WBC 8.0 11/20/2022   HGB 14.8 11/20/2022   HCT 44.8 11/20/2022   PLT 159.0 11/20/2022   GLUCOSE 143 (H) 11/20/2022   CHOL 176 12/05/2021   TRIG 85.0 12/05/2021   HDL 55.10 12/05/2021   LDLCALC 104 (H) 12/05/2021   ALT 17 11/20/2022   AST 23 11/20/2022   NA 140 11/20/2022   K 3.2 (L) 11/20/2022   CL 99 11/20/2022   CREATININE 0.91 11/20/2022   BUN 11 11/20/2022   CO2 30 11/20/2022   TSH 1.23 08/18/2022   INR 1.05 02/12/2017   HGBA1C 7.2 (H) 01/17/2022   MICROALBUR 3.3 (H) 01/17/2022    Lab Results  Component Value Date   TSH 1.23 08/18/2022   Lab Results  Component Value Date   WBC 8.0 11/20/2022   HGB 14.8 11/20/2022   HCT 44.8 11/20/2022   MCV 83.0 11/20/2022   PLT 159.0 11/20/2022   Lab Results  Component Value Date   NA 140 11/20/2022   K 3.2 (L) 11/20/2022   CO2 30 11/20/2022   GLUCOSE 143 (H) 11/20/2022   BUN 11 11/20/2022   CREATININE  0.91 11/20/2022   BILITOT 0.8 11/20/2022   ALKPHOS 65 11/20/2022   AST 23 11/20/2022   ALT 17 11/20/2022   PROT 7.0 11/20/2022   ALBUMIN 4.0 11/20/2022   CALCIUM 9.3 11/20/2022   ANIONGAP 13 05/24/2020   EGFR 60 04/11/2022   GFR 69.21 11/20/2022    Lab Results  Component Value Date   CHOL 176 12/05/2021   Lab Results  Component Value Date   HDL 55.10 12/05/2021   Lab Results  Component Value Date   LDLCALC 104 (H) 12/05/2021   Lab Results  Component Value Date   TRIG 85.0 12/05/2021   Lab Results  Component Value Date   CHOLHDL 3 12/05/2021   Lab Results  Component Value Date   HGBA1C 7.2 (H) 01/17/2022       Assessment & Plan:   Problem List Items Addressed This Visit   None Visit Diagnoses     Bronchitis    -  Primary   Relevant Medications   predniSONE (DELTASONE) 10 MG tablet   azithromycin (ZITHROMAX Z-PAK) 250 MG tablet      Meds ordered this encounter  Medications   predniSONE (DELTASONE) 10 MG tablet    Sig: TAKE 3 TABLETS PO QD FOR 3 DAYS THEN TAKE 2 TABLETS PO QD FOR 3 DAYS THEN TAKE 1 TABLET PO QD FOR 3 DAYS THEN TAKE 1/2 TAB PO QD FOR 3 DAYS    Dispense:  20 tablet    Refill:  0   azithromycin (ZITHROMAX Z-PAK) 250 MG tablet    Sig: As directed    Dispense:  6 each    Refill:  0   I, Ann Held, DO, personally preformed the services described in this documentation.  All medical record entries made by the scribe were at my direction and in my presence.  I have reviewed the chart and discharge instructions (if applicable) and agree that the record reflects my personal performance and is accurate and complete. 11/27/2022  I,Rachel Rivera,acting as a scribe for Ann Held, DO.,have documented all relevant documentation on the behalf of Ann Held, DO,as directed by  Ann Held, DO while in the presence of [place], DO, have reviewed all documentation for this visit. The documentation on 11/27/22 for the exam, diagnosis, procedures, and orders are all accurate and complete.   Ann Held, DO

## 2022-12-01 ENCOUNTER — Telehealth: Payer: Self-pay | Admitting: Family Medicine

## 2022-12-01 NOTE — Telephone Encounter (Signed)
Patient brought in forms   Would like to be called when ready for pick up [phone]  Patient states that these are due by 2/04  Placed in lowne bin up front

## 2022-12-05 NOTE — Telephone Encounter (Signed)
Could you find what these forms are for? The last FMLA in her chart is from 2022. It may be from when she was sick recently? Thanks!

## 2022-12-11 NOTE — Telephone Encounter (Signed)
Attempted to call patient. Was on hold and a interpreter did not pick up. FMLA completed and placed at the front. Copies sent to scan.

## 2022-12-11 NOTE — Telephone Encounter (Signed)
Pt states she was wanting fmla papers for being sick, and in case she needs to miss work again as this is a constant issue. She stated she is very frustrated because she never received a call with questions and is not sure what the hold up was. She stated the papers are dude today and if they are not filled out she would like to file a complaint with the manager.

## 2022-12-12 ENCOUNTER — Ambulatory Visit: Payer: Federal, State, Local not specified - PPO | Admitting: Family Medicine

## 2022-12-13 ENCOUNTER — Ambulatory Visit (INDEPENDENT_AMBULATORY_CARE_PROVIDER_SITE_OTHER): Payer: Federal, State, Local not specified - PPO | Admitting: Family Medicine

## 2022-12-13 ENCOUNTER — Encounter: Payer: Self-pay | Admitting: Family Medicine

## 2022-12-13 VITALS — BP 143/87 | HR 56 | Temp 97.5°F | Resp 16 | Ht 62.0 in | Wt 217.0 lb

## 2022-12-13 DIAGNOSIS — R3915 Urgency of urination: Secondary | ICD-10-CM

## 2022-12-13 LAB — POCT URINALYSIS DIPSTICK
Blood, UA: NEGATIVE
Glucose, UA: NEGATIVE
Ketones, UA: POSITIVE — AB
Leukocytes, UA: NEGATIVE
Nitrite, UA: NEGATIVE
Protein, UA: POSITIVE — AB
Spec Grav, UA: 1.02 (ref 1.010–1.025)
Urobilinogen, UA: 0.2 E.U./dL
pH, UA: 6 (ref 5.0–8.0)

## 2022-12-13 NOTE — Patient Instructions (Signed)
Urinalysis positive for bili, ketones, and protein, but no leuks or nitrites. Will send for culture. Adding blood work as well to check status of kidneys and diabetes. Discussed adequate hydration and diabetic diet (handout provided). Advised that you start checking you fasting glucose levels each morning and keeping a lot for [place]. We will update you with results and plan. Recommend repeating urinalysis in 1-2 weeks to ensure stabilizing.

## 2022-12-13 NOTE — Progress Notes (Signed)
Acute Office Visit  Subjective:     Patient ID: Rhonda Morrow, female    DOB: 1961/07/19, 62 y.o.   MRN: [id number]  Chief Complaint  Patient presents with   Dysuria    Here for urinary problem      Patient is in today for urinary urgency/frequency. She is accompanied by an ASL interpreter.  Patient states that for the past month or so she has been having increased urinary frequency and urgency at night. She often cannot get to the bathroom in time and has an accident in the hall on the way. States she gets up about 3-4 times during the night to void and sometimes wets the bed. She feels a little more hungry and thirsty in the evenings as well. She has not had any increased in hunger/thirst/urination during the daytime. She drinks about 1 liter of water a day plus one can of regular Dr. Malachi Bonds. States she does tend to eat a lot of simple carbs because the are more affordable. She takes her metformin as prescribed, but does not check her blood glucose regularly. She denies any fevers, chills, pain, dysuria, hematuria, fatigue/malaise, hypoglycemic events.    All review of systems negative except what is listed in the HPI     Objective:    BP (!) 143/87   Pulse (!) 56   Temp (!) 97.5 F (36.4 C) (Oral)   Resp 16   Ht '5\' 2"'$  (1.575 m)   Wt 217 lb (98.4 kg)   LMP 05/20/2012   SpO2 99%   BMI 39.69 kg/m    Physical Exam Vitals reviewed.  Constitutional:      Appearance: Normal appearance.  Cardiovascular:     Rate and Rhythm: Normal rate and regular rhythm.     Pulses: Normal pulses.     Heart sounds: Normal heart sounds.  Pulmonary:     Effort: Pulmonary effort is normal.     Breath sounds: Normal breath sounds.  Abdominal:     General: There is no distension.     Tenderness: There is no abdominal tenderness. There is no right CVA tenderness, left CVA tenderness or guarding.  Skin:    General: Skin is warm and dry.  Neurological:     Mental Status: She is alert and  oriented to person, place, and time.  Psychiatric:        Mood and Affect: Mood normal.        Behavior: Behavior normal.        Thought Content: Thought content normal.        Judgment: Judgment normal.        Results for orders placed or performed in visit on 12/13/22  POC Urinalysis Dipstick  Result Value Ref Range   Color, UA yellow    Clarity, UA clear    Glucose, UA Negative Negative   Bilirubin, UA Moderate (A)    Ketones, UA Positive (A)    Spec Grav, UA 1.020 1.010 - 1.025   Blood, UA Negative    pH, UA 6.0 5.0 - 8.0   Protein, UA Positive (A) Negative   Urobilinogen, UA 0.2 0.2 or 1.0 E.U./dL   Nitrite, UA Negative    Leukocytes, UA Negative Negative   Appearance     Odor          Assessment & Plan:   Problem List Items Addressed This Visit   None Visit Diagnoses     Urinary urgency    -  Primary   Relevant Orders   CBC with Differential/Platelet   Comprehensive metabolic panel   Hemoglobin A1c   POC Urinalysis Dipstick (Completed)   Urine Culture      Urinalysis positive for bili, ketones, and protein, but no leuks or nitrites. Will send for culture. Adding blood work as well to check status of kidneys and diabetes. Discussed adequate hydration and diabetic diet (handout provided). Advised that she start checking her fasting glucose levels each morning and keeping a lot for [place]. We will update her with results and plan. Recommend repeating urinalysis in 1-2 weeks to ensure stabilizing.    No orders of the defined types were placed in this encounter.   Return for (pending results) or if symptoms fail to improve.  Terrilyn Saver, NP

## 2022-12-14 ENCOUNTER — Other Ambulatory Visit (INDEPENDENT_AMBULATORY_CARE_PROVIDER_SITE_OTHER): Payer: Federal, State, Local not specified - PPO

## 2022-12-14 DIAGNOSIS — R3915 Urgency of urination: Secondary | ICD-10-CM

## 2022-12-15 LAB — COMPREHENSIVE METABOLIC PANEL
ALT: 21 U/L (ref 0–35)
AST: 23 U/L (ref 0–37)
Albumin: 3.8 g/dL (ref 3.5–5.2)
Alkaline Phosphatase: 63 U/L (ref 39–117)
BUN: 15 mg/dL (ref 6–23)
CO2: 30 mEq/L (ref 19–32)
Calcium: 9.3 mg/dL (ref 8.4–10.5)
Chloride: 100 mEq/L (ref 96–112)
Creatinine, Ser: 1.01 mg/dL (ref 0.40–1.20)
GFR: 61.04 mL/min (ref >60.00)
Glucose, Bld: 82 mg/dL (ref 70–99)
Potassium: 3.5 mEq/L (ref 3.5–5.1)
Sodium: 144 mEq/L (ref 135–145)
Total Bilirubin: 0.6 mg/dL (ref 0.2–1.2)
Total Protein: 6.5 g/dL (ref 6.0–8.3)

## 2022-12-15 LAB — URINE CULTURE
MICRO NUMBER:: 14538608
SPECIMEN QUALITY:: ADEQUATE

## 2022-12-15 LAB — CBC WITH DIFFERENTIAL/PLATELET
Basophils Absolute: 0.1 10*3/uL (ref 0.0–0.1)
Basophils Relative: 0.9 % (ref 0.0–3.0)
Eosinophils Absolute: 0.1 10*3/uL (ref 0.0–0.7)
Eosinophils Relative: 1.9 % (ref 0.0–5.0)
HCT: 42.7 % (ref 36.0–46.0)
Hemoglobin: 14 g/dL (ref 12.0–15.0)
Lymphocytes Relative: 32.2 % (ref 12.0–46.0)
Lymphs Abs: 2.1 10*3/uL (ref 0.7–4.0)
MCHC: 32.7 g/dL (ref 30.0–36.0)
MCV: 84.2 fl (ref 78.0–100.0)
Monocytes Absolute: 0.8 10*3/uL (ref 0.1–1.0)
Monocytes Relative: 12.3 % — ABNORMAL HIGH (ref 3.0–12.0)
Neutro Abs: 3.5 10*3/uL (ref 1.4–7.7)
Neutrophils Relative %: 52.7 % (ref 43.0–77.0)
Platelets: 129 10*3/uL — ABNORMAL LOW (ref 150.0–400.0)
RBC: 5.07 Mil/uL (ref 3.87–5.11)
RDW: 15.4 % (ref 11.5–15.5)
WBC: 6.7 10*3/uL (ref 4.0–10.5)

## 2022-12-15 LAB — HEMOGLOBIN A1C: Hgb A1c MFr Bld: 6.9 % — ABNORMAL HIGH (ref 4.6–6.5)

## 2022-12-17 NOTE — Addendum Note (Signed)
Addended by: Terrilyn Saver on: 12/17/2022 08:58 PM   Modules accepted: Orders

## 2023-01-02 ENCOUNTER — Other Ambulatory Visit (INDEPENDENT_AMBULATORY_CARE_PROVIDER_SITE_OTHER): Payer: Federal, State, Local not specified - PPO

## 2023-01-02 DIAGNOSIS — R3915 Urgency of urination: Secondary | ICD-10-CM | POA: Diagnosis not present

## 2023-01-02 DIAGNOSIS — R8271 Bacteriuria: Secondary | ICD-10-CM

## 2023-01-02 LAB — URINALYSIS, ROUTINE W REFLEX MICROSCOPIC
Hgb urine dipstick: NEGATIVE
Ketones, ur: 15 — AB
Leukocytes,Ua: NEGATIVE
Nitrite: NEGATIVE
Specific Gravity, Urine: >=1.03 — AB (ref 1.000–1.030)
Urine Glucose: NEGATIVE
Urobilinogen, UA: 1 (ref 0.0–1.0)
pH: 6 (ref 5.0–8.0)

## 2023-01-03 DIAGNOSIS — R3915 Urgency of urination: Secondary | ICD-10-CM | POA: Diagnosis not present

## 2023-01-03 DIAGNOSIS — R8271 Bacteriuria: Secondary | ICD-10-CM | POA: Diagnosis not present

## 2023-01-03 NOTE — Addendum Note (Signed)
Addended by: Ronny Flurry on: 01/03/2023 09:43 AM   Modules accepted: Orders

## 2023-01-04 LAB — URINE CULTURE
MICRO NUMBER:: 14626342
Result:: NO GROWTH
SPECIMEN QUALITY:: ADEQUATE

## 2023-01-06 ENCOUNTER — Other Ambulatory Visit: Payer: Self-pay | Admitting: Family Medicine

## 2023-01-06 DIAGNOSIS — R1013 Epigastric pain: Secondary | ICD-10-CM

## 2023-01-18 ENCOUNTER — Other Ambulatory Visit: Payer: Self-pay | Admitting: Family Medicine

## 2023-01-18 DIAGNOSIS — I10 Essential (primary) hypertension: Secondary | ICD-10-CM

## 2023-01-22 ENCOUNTER — Ambulatory Visit: Payer: Federal, State, Local not specified - PPO | Admitting: Pulmonary Disease

## 2023-01-30 ENCOUNTER — Ambulatory Visit (INDEPENDENT_AMBULATORY_CARE_PROVIDER_SITE_OTHER): Payer: Federal, State, Local not specified - PPO | Admitting: Family Medicine

## 2023-01-30 VITALS — BP 110/80 | HR 80 | Temp 97.7°F | Resp 18 | Ht 62.0 in

## 2023-01-30 DIAGNOSIS — R32 Unspecified urinary incontinence: Secondary | ICD-10-CM | POA: Diagnosis not present

## 2023-01-30 DIAGNOSIS — N898 Other specified noninflammatory disorders of vagina: Secondary | ICD-10-CM | POA: Diagnosis not present

## 2023-01-30 DIAGNOSIS — E1165 Type 2 diabetes mellitus with hyperglycemia: Secondary | ICD-10-CM

## 2023-01-30 LAB — POC URINALSYSI DIPSTICK (AUTOMATED)
Blood, UA: NEGATIVE
Glucose, UA: NEGATIVE
Ketones, UA: POSITIVE
Nitrite, UA: NEGATIVE
Protein, UA: POSITIVE — AB
Spec Grav, UA: 1.02 (ref 1.010–1.025)
Urobilinogen, UA: 0.2 E.U./dL
pH, UA: 6 (ref 5.0–8.0)

## 2023-01-30 LAB — GLUCOSE, POCT (MANUAL RESULT ENTRY): POC Glucose: 190 mg/dl — AB (ref 70–99)

## 2023-01-30 MED ORDER — TERCONAZOLE 0.4 % VA CREA: 1.0000 | TOPICAL_CREAM | Freq: Every day | VAGINAL | 0 refills | Status: DC

## 2023-01-30 MED ORDER — SOLIFENACIN SUCCINATE 5 MG PO TABS: 5.0000 mg | ORAL_TABLET | Freq: Every day | ORAL | 2 refills | Status: DC

## 2023-01-30 MED ORDER — RYBELSUS 3 MG PO TABS: 3.0000 mg | ORAL_TABLET | Freq: Every day | ORAL | 0 refills | Status: DC

## 2023-01-30 NOTE — Patient Instructions (Signed)
  Can use monistat or vagisil if prescription cream is too expensive   Urinary Frequency, Adult Urinary frequency means urinating more often than usual. You may urinate every 1-2 hours even though you drink a normal amount of fluid and do not have a bladder infection or condition. Although you urinate more often than normal, the total amount of urine produced in a day is normal. With urinary frequency, you may have an urgent need to urinate often. The stress and anxiety of needing to find a bathroom quickly can make this urge worse. This condition may go away on its own, or you may need treatment at home. Home treatment may include bladder training, exercises, taking medicines, or making changes to your diet. Follow these instructions at home: Bladder health Your health care provider will tell you what to do to improve bladder health. You may be told to: Keep a bladder diary. Keep track of: What you eat and drink. How often you urinate. How much you urinate. Follow a bladder training program. This may include: Learning to delay going to the bathroom. Double urinating, also called voiding. This helps if you are not completely emptying your bladder. Scheduled voiding. Do Kegel exercises. Kegel exercises strengthen the muscles that help control urination, which may help the condition.  Eating and drinking Follow instructions from your health care provider about eating or drinking restrictions. You may be told to: Avoid caffeine. Drink fewer fluids, especially alcohol. Avoid drinking in the evening. Avoid foods or drinks that may irritate the bladder. These include coffee, tea, soda, artificial sweeteners, citrus, tomato-based foods, and chocolate. Eat foods that help prevent or treat constipation. Constipation can make urinary frequency worse. You may need to take these actions to prevent or treat constipation: Drink enough fluid to keep your urine pale yellow. Take over-the-counter or  prescription medicines. Eat foods that are high in fiber, such as beans, whole grains, and fresh fruits and vegetables. Limit foods that are high in fat and processed sugars, such as fried or sweet foods. General instructions Take over-the-counter and prescription medicines only as told by your health care provider. Keep all follow-up visits. This is important. Contact a health care provider if: You start urinating more often. You feel pain or irritation when you urinate. You notice blood in your urine. Your urine looks cloudy. You develop a fever. You begin vomiting. Get help right away if: You are unable to urinate. Summary Urinary frequency means urinating more often than usual. With urinary frequency, you may urinate every 1-2 hours even though you drink a normal amount of fluid and do not have a bladder infection or other bladder condition. Your health care provider may recommend that you keep a bladder diary, follow a bladder training program, or make dietary changes. If told by your health care provider, do Kegel exercises to strengthen the muscles that help control urination. Take over-the-counter and prescription medicines only as told by your health care provider. Contact a health care provider if your symptoms do not improve or get worse. This information is not intended to replace advice given to you by your health care provider. Make sure you discuss any questions you have with your health care provider. Document Revised: 05/28/2020 Document Reviewed: 05/28/2020 Elsevier Patient Education  [place].

## 2023-01-30 NOTE — Progress Notes (Signed)
Subjective:   By signing my name below, I, Shehryar Baig, attest that this documentation has been prepared under the direction and in the presence of Ann Held, DO. 01/30/2023   Patient ID: Rhonda Morrow, female    DOB: [birth year], 62 y.o.   MRN: [id number]  Chief Complaint  Patient presents with   Urinary Frequency   Follow-up    Urinary Frequency  Associated symptoms include frequency. Pertinent negatives include no nausea.   Patient is in today for a follow up visit.   She continues having urinary frequency. She also developed urinary incontinence only at night and vaginal itching for the past 2 weeks. Coughing and sneezing occasionally cause her to leak. She is following up with a GYN specialist regularly at this time. She has no history of bladder or uterus dropping.   Past Medical History:  Diagnosis Date   Arthritis    Asthma    Complication of anesthesia    one time woke up and was vey scared,17 yrs ago   Deaf    Diabetes mellitus without complication ([place])    GERD (gastroesophageal reflux disease)    Hypertension    Left groin pain    Nausea with vomiting 02/19/2014   Thyroid disease     Past Surgical History:  Procedure Laterality Date   CARDIAC CATHETERIZATION     CESAREAN SECTION     CHOLECYSTECTOMY N/A 06/20/2016   Procedure: LAPAROSCOPIC CHOLECYSTECTOMY;  Surgeon: Rolm Bookbinder, MD;  Location: [facility];  Service: General;  Laterality: N/A;   ESOPHAGEAL MANOMETRY N/A 09/29/2013   Procedure: ESOPHAGEAL MANOMETRY (EM);  Surgeon: Milus Banister, MD;  Location: [facility];  Service: Endoscopy;  Laterality: N/A;   TOTAL HIP ARTHROPLASTY Left 02/21/2017   Procedure: LEFT TOTAL HIP ARTHROPLASTY ANTERIOR APPROACH;  Surgeon: Leandrew Koyanagi, MD;  Location: [facility];  Service: Orthopedics;  Laterality: Left;    Family History  Problem Relation Age of Onset   Diabetes Mother    Heart disease Mother    Diabetes Father     Social History   Socioeconomic  History   Marital status: Married    Spouse name: Not on file   Number of children: Not on file   Years of education: Not on file   Highest education level: Not on file  Occupational History   Occupation: disabled  Tobacco Use   Smoking status: Never   Smokeless tobacco: Never  Vaping Use   Vaping Use: Never used  Substance and Sexual Activity   Alcohol use: No    Alcohol/week: 0.0 standard drinks of alcohol   Drug use: No   Sexual activity: Not Currently  Other Topics Concern   Not on file  Social History Narrative   Lives with family.  Has 3 children.  Works for [employer].  Education: high school.   Social Determinants of Health   Financial Resource Strain: Not on file  Food Insecurity: Not on file  Transportation Needs: Not on file  Physical Activity: Not on file  Stress: Not on file  Social Connections: Not on file  Intimate Partner Violence: Not on file    Outpatient Medications Prior to Visit  Medication Sig Dispense Refill   albuterol (VENTOLIN HFA) 108 (90 Base) MCG/ACT inhaler Inhale 2 puffs into the lungs every 6 (six) hours as needed for wheezing or shortness of breath. 8 g 6   allopurinol (ZYLOPRIM) 100 MG tablet TAKE 1 TABLET BY MOUTH EVERY DAY 90 tablet 3  amLODipine (NORVASC) 5 MG tablet TAKE 1 TABLET (5 MG TOTAL) BY MOUTH DAILY. 90 tablet 1   Azelastine HCl 137 MCG/SPRAY SOLN PLACE 2 SPRAYS INTO BOTH NOSTRILS 2 (TWO) TIMES DAILY. USE IN EACH NOSTRIL AS DIRECTED 30 mL 3   blood glucose meter kit and supplies KIT Dispense based on patient and insurance preference. Use up to four times daily as directed. (FOR ICD-9 250.00, 250.01). (Patient not taking: Reported on 11/13/2022) 1 each 0   Budeson-Glycopyrrol-Formoterol (BREZTRI AEROSPHERE) 160-9-4.8 MCG/ACT AERO Inhale 2 puffs into the lungs in the morning and at bedtime. 1 each 11   Budeson-Glycopyrrol-Formoterol (BREZTRI AEROSPHERE) 160-9-4.8 MCG/ACT AERO Inhale 2 puffs into the lungs in the morning and at  bedtime. 10.7 g 0   cetirizine (ZYRTEC) 10 MG tablet TAKE 1 TABLET BY MOUTH EVERY DAY 90 tablet 1   cyclobenzaprine (FLEXERIL) 10 MG tablet Take 1 tablet (10 mg total) by mouth 3 (three) times daily as needed for muscle spasms. 30 tablet 0   meloxicam (MOBIC) 7.5 MG tablet TAKE 1 TO 2 TABLETS BY MOUTH DAILY AS NEEDED 60 tablet 1   metFORMIN (GLUCOPHAGE-XR) 500 MG 24 hr tablet TAKE 2 TABLETS BY MOUTH EVERY DAY WITH BREAKFAST 180 tablet 1   omeprazole (PRILOSEC) 20 MG capsule TAKE 1 CAPSULE (20 MG TOTAL) BY MOUTH 2 (TWO) TIMES DAILY BEFORE A MEAL. 180 capsule 1   oseltamivir (TAMIFLU) 75 MG capsule Take 1 capsule (75 mg total) by mouth 2 (two) times daily. 10 capsule 0   promethazine-dextromethorphan (PROMETHAZINE-DM) 6.25-15 MG/5ML syrup Take 5 mLs by mouth 4 (four) times daily as needed. 118 mL 0   rosuvastatin (CRESTOR) 20 MG tablet TAKE 1 TABLET BY MOUTH EVERYDAY AT BEDTIME 90 tablet 0   azithromycin (ZITHROMAX Z-PAK) 250 MG tablet As directed 6 each 0   azithromycin (ZITHROMAX Z-PAK) 250 MG tablet As directed 6 each 0   COVID-19 mRNA vaccine 2023-2024 (COMIRNATY) syringe Inject into the muscle. (Patient not taking: Reported on 11/13/2022) 0.3 mL 0   gabapentin (NEURONTIN) 100 MG capsule Take 1-3 capsules (100-300 mg total) by mouth at bedtime as needed (nerve pain at bedtime). (Patient not taking: Reported on 11/13/2022) 60 capsule 3   predniSONE (DELTASONE) 10 MG tablet TAKE 3 TABLETS PO QD FOR 3 DAYS THEN TAKE 2 TABLETS PO QD FOR 3 DAYS THEN TAKE 1 TABLET PO QD FOR 3 DAYS THEN TAKE 1/2 TAB PO QD FOR 3 DAYS 20 tablet 0   promethazine-dextromethorphan (PROMETHAZINE-DM) 6.25-15 MG/5ML syrup Take 5 mLs by mouth 4 (four) times daily as needed. (Patient not taking: Reported on 11/13/2022) 118 mL 0   Facility-Administered Medications Prior to Visit  Medication Dose Route Frequency Provider Last Rate Last Admin   alum & mag hydroxide-simeth (MAALOX/MYLANTA) 200-200-20 MG/5ML suspension 30 mL  30 mL Oral  Once Saguier, Edward, PA-C       lidocaine (XYLOCAINE) 2 % viscous mouth solution 15 mL  15 mL Mouth/Throat Once Saguier, Edward, PA-C       nitroGLYCERIN (NITRODUR - Dosed in mg/24 hr) patch 0.2 mg  0.2 mg Transdermal Daily Regal, Tamala Fothergill, DPM        Allergies  Allergen Reactions   Losartan Shortness Of Breath   Oxycodone-Acetaminophen Nausea And Vomiting   Augmentin [Amoxicillin-Pot Clavulanate] Diarrhea    Review of Systems  Constitutional:  Negative for fever and malaise/fatigue.  HENT:  Negative for congestion.   Eyes:  Negative for blurred vision.  Respiratory:  Negative for shortness of breath.  Cardiovascular:  Negative for chest pain, palpitations and leg swelling.  Gastrointestinal:  Negative for abdominal pain, blood in stool and nausea.  Genitourinary:  Positive for frequency. Negative for dysuria.       (+)urinary incontinence (+)vaginal itching  Musculoskeletal:  Negative for falls.  Skin:  Negative for rash.  Neurological:  Negative for dizziness, loss of consciousness and headaches.  Endo/Heme/Allergies:  Negative for environmental allergies.  Psychiatric/Behavioral:  Negative for depression. The patient is not nervous/anxious.        Objective:    Physical Exam Vitals and nursing note reviewed.  Constitutional:      General: She is not in acute distress.    Appearance: Normal appearance. She is not ill-appearing.  HENT:     Head: Normocephalic and atraumatic.     Right Ear: External ear normal.     Left Ear: External ear normal.  Eyes:     Extraocular Movements: Extraocular movements intact.     Pupils: Pupils are equal, round, and reactive to light.  Cardiovascular:     Rate and Rhythm: Normal rate and regular rhythm.     Heart sounds: Normal heart sounds. No murmur heard.    No gallop.  Pulmonary:     Effort: Pulmonary effort is normal. No respiratory distress.     Breath sounds: Normal breath sounds. No wheezing or rales.  Skin:    General:  Skin is warm and dry.  Neurological:     Mental Status: She is alert and oriented to person, place, and time.  Psychiatric:        Judgment: Judgment normal.     BP 110/80 (BP Location: Left Arm, Patient Position: Sitting, Cuff Size: Large)   Pulse 80   Temp 97.7 F (36.5 C) (Oral)   Resp 18   Ht 5\' 2"  (1.575 m)   LMP 05/20/2012   SpO2 98%   BMI 39.69 kg/m  Wt Readings from Last 3 Encounters:  12/13/22 217 lb (98.4 kg)  11/27/22 212 lb 6.4 oz (96.3 kg)  11/20/22 218 lb (98.9 kg)       Assessment & Plan:  Urinary incontinence, unspecified type Assessment & Plan: Try vesicare daily and f/u gyn   Orders: -     POCT Urinalysis Dipstick (Automated) -     Urine Culture -     Solifenacin Succinate; Take 1 tablet (5 mg total) by mouth daily.  Dispense: 30 tablet; Refill: 2  Vaginal itching Assessment & Plan: Use monistat or vagisil if rx too expensive   Orders: -     Terconazole; Place 1 applicator vaginally at bedtime.  Dispense: 45 g; Refill: 0  Type 2 diabetes mellitus with hyperglycemia, without long-term current use of insulin (HCC) -     POCT glucose (manual entry) -     Rybelsus; Take 1 tablet (3 mg total) by mouth daily.  Dispense: 30 tablet; Refill: 0    I, Ann Held, DO, personally preformed the services described in this documentation.  All medical record entries made by the scribe were at my direction and in my presence.  I have reviewed the chart and discharge instructions (if applicable) and agree that the record reflects my personal performance and is accurate and complete. 01/30/2023   I,Shehryar Baig,acting as a scribe for Ann Held, DO.,have documented all relevant documentation on the behalf of Ann Held, DO,as directed by  Ann Held, DO while in the presence of Rosalita Chessman  Chase, DO.   Ann Held, DO

## 2023-01-31 ENCOUNTER — Encounter: Payer: Self-pay | Admitting: Family Medicine

## 2023-01-31 DIAGNOSIS — N898 Other specified noninflammatory disorders of vagina: Secondary | ICD-10-CM | POA: Insufficient documentation

## 2023-01-31 DIAGNOSIS — R32 Unspecified urinary incontinence: Secondary | ICD-10-CM | POA: Insufficient documentation

## 2023-01-31 LAB — URINE CULTURE
MICRO NUMBER:: 14743063
SPECIMEN QUALITY:: ADEQUATE

## 2023-01-31 NOTE — Assessment & Plan Note (Signed)
Use monistat or vagisil if rx too expensive

## 2023-01-31 NOTE — Assessment & Plan Note (Signed)
Try vesicare daily and f/u gyn

## 2023-02-06 ENCOUNTER — Other Ambulatory Visit: Payer: Self-pay | Admitting: Family Medicine

## 2023-02-06 DIAGNOSIS — E1165 Type 2 diabetes mellitus with hyperglycemia: Secondary | ICD-10-CM

## 2023-02-07 ENCOUNTER — Other Ambulatory Visit (HOSPITAL_COMMUNITY): Payer: Self-pay

## 2023-02-07 NOTE — Telephone Encounter (Signed)
Per test claim, no PA is required for Rybelsus 3mg . The system automatically applies a voucher offered by [employer], however, the pt's copay should be $35.00 (Please note this is in reference to a [employer], prices are subject to change depending on filling pharmacy).

## 2023-02-12 ENCOUNTER — Ambulatory Visit: Payer: Federal, State, Local not specified - PPO | Admitting: Family Medicine

## 2023-02-12 NOTE — Progress Notes (Deleted)
   Rubin Payor, PhD, LAT, ATC acting as a scribe for Rhonda Graham, MD.  Rhonda Morrow is a 62 y.o. female who presents to [facility] today for cont'd hip and feet pain.  Pt was seen by Dr. Roda Shutters on 03/01/22 for bilat hip pain. Pt had a L hip replacement on 02/21/17. Patient was last seen by Dr. Denyse Amass on 10/09/2022 for right knee and left leg pain.  Patient's last left GT steroid injection was on 08/01/2022.  Today, patient reports ***  Dx imaging: 08/16/21 Bilat hip/pelvis XR             05/14/19 Bilat hip/pelvis XR             10/18/18 Ab-Pelvis CT             02/11/18 L hip XR             09/11/17 Pelvis XR  Pertinent review of systems: ***  Relevant historical information: ***   Exam:  LMP 05/20/2012  General: Well Developed, well nourished, and in no acute distress.   MSK: ***    Lab and Radiology Results No results found. However, due to the size of the patient record, not all encounters were searched. Please check Results Review for a complete set of results. No results found.     Assessment and Plan: 62 y.o. female with ***   PDMP not reviewed this encounter. No orders of the defined types were placed in this encounter.  No orders of the defined types were placed in this encounter.    Discussed warning signs or symptoms. Please see discharge instructions. Patient expresses understanding.   ***

## 2023-02-15 ENCOUNTER — Other Ambulatory Visit: Payer: Self-pay | Admitting: Family Medicine

## 2023-02-26 ENCOUNTER — Ambulatory Visit (INDEPENDENT_AMBULATORY_CARE_PROVIDER_SITE_OTHER): Payer: Federal, State, Local not specified - PPO | Admitting: Obstetrics and Gynecology

## 2023-02-26 ENCOUNTER — Other Ambulatory Visit (HOSPITAL_COMMUNITY): Payer: Self-pay

## 2023-02-26 ENCOUNTER — Encounter: Payer: Self-pay | Admitting: Obstetrics and Gynecology

## 2023-02-26 VITALS — BP 125/53 | HR 74 | Wt 211.0 lb

## 2023-02-26 DIAGNOSIS — L292 Pruritus vulvae: Secondary | ICD-10-CM | POA: Diagnosis not present

## 2023-02-26 DIAGNOSIS — Z603 Acculturation difficulty: Secondary | ICD-10-CM | POA: Diagnosis not present

## 2023-02-26 DIAGNOSIS — N3946 Mixed incontinence: Secondary | ICD-10-CM | POA: Diagnosis not present

## 2023-02-26 DIAGNOSIS — Z758 Other problems related to medical facilities and other health care: Secondary | ICD-10-CM

## 2023-02-26 NOTE — Telephone Encounter (Signed)
Per test claim, no PA is needed.

## 2023-02-26 NOTE — Progress Notes (Signed)
GYNECOLOGY VISIT  Patient name: Rhonda Morrow MRN [id number]  Date of birth: Apr 20, 1961 Chief Complaint:   Urinary Incontinence   History:  Rhonda Morrow is a 62 y.o. [phone] being seen today for urinary incontinence. The new medication has been helping. May miss a few nights of the medication and notice she will have an accident. Someone gave her some cream and it helped.  Itching is outside of the vagina area and that is where the cream goes. The cream has been helping a bit.. Sometimes she can remember to use it and sometimes she doesn't. If it itching will use the cream as needed. Just feels likt it's right there Using soap to  Uriary symptoms; wakes up 2-3 in the morning  Going to sleep 11/12 sometimes 2-3 AM Drinks water in the morinng Has soda once - 1/2 to take her meds as it is eaiser Some time smouth is dry at night and will get up to drink water Will typically drink a full cup   May fall asleep soon after work. 7:30am - 4pm; but son wakes up 4/5am and she has to get up to drop him off at work and picking him up after work at  6:15pm   Accidents in a week: 2-3  Sneeze/laugh/cough accidents - not often Usu an issue if someone in the bathroom and she has to wait to void May dribble on the way to the bathroom - because she is walking slow due to hip pain Had surgery on that hip in the past    Past Medical History:  Diagnosis Date   Arthritis    Asthma    Complication of anesthesia    one time woke up and was vey scared,17 yrs ago   Deaf    Diabetes mellitus without complication    GERD (gastroesophageal reflux disease)    Hypertension    Left groin pain    Nausea with vomiting 02/19/2014   Thyroid disease     Past Surgical History:  Procedure Laterality Date   CARDIAC CATHETERIZATION     CESAREAN SECTION     CHOLECYSTECTOMY N/A 06/20/2016   Procedure: LAPAROSCOPIC CHOLECYSTECTOMY;  Surgeon: Emelia Loron, MD;  Location: [facility];  Service: General;   Laterality: N/A;   ESOPHAGEAL MANOMETRY N/A 09/29/2013   Procedure: ESOPHAGEAL MANOMETRY (EM);  Surgeon: Rachael Fee, MD;  Location: [facility];  Service: Endoscopy;  Laterality: N/A;   TOTAL HIP ARTHROPLASTY Left 02/21/2017   Procedure: LEFT TOTAL HIP ARTHROPLASTY ANTERIOR APPROACH;  Surgeon: Tarry Kos, MD;  Location: [facility];  Service: Orthopedics;  Laterality: Left;    The following portions of the patient's history were reviewed and updated as appropriate: allergies, current medications, past family history, past medical history, past social history, past surgical history and problem list.   Health Maintenance:   Last pap     Component Value Date/Time   DIAGPAP  11/13/2022 1148    - Negative for intraepithelial lesion or malignancy (NILM)   DIAGPAP  03/03/2020 1335    - Negative for intraepithelial lesion or malignancy (NILM)   HPVHIGH Negative 11/13/2022 1148   HPVHIGH Negative 03/03/2020 1335   ADEQPAP  11/13/2022 1148    Satisfactory for evaluation; transformation zone component PRESENT.   ADEQPAP  03/03/2020 1335    Satisfactory for evaluation; transformation zone component PRESENT.    Review of Systems:  Pertinent items are noted in HPI. Comprehensive review of systems was otherwise negative.   Objective:  Physical Exam BP (!) 125/53   Pulse 74   Wt 211 lb (95.7 kg)   LMP 05/20/2012   BMI 38.59 kg/m    Physical Exam Vitals and nursing note reviewed.  Constitutional:      Appearance: Normal appearance.  HENT:     Head: Normocephalic and atraumatic.  Pulmonary:     Effort: Pulmonary effort is normal.  Skin:    General: Skin is warm and dry.  Neurological:     General: No focal deficit present.     Mental Status: She is alert.  Psychiatric:        Mood and Affect: Mood normal.        Behavior: Behavior normal.        Thought Content: Thought content normal.        Judgment: Judgment normal.       Assessment & Plan:   1. Mixed stress and urge  urinary incontinence Aspects of functional incontineucne due to altered ambulation due to hip pian as well as bathroom availability. Discussed behavioral changes including moving up last time of fluid intake prior to going to bed. Would benefit from improved sleep hygiene as also frequently falling asleep on the couch and has inconsistent time she goes to bed. Recommend followup with PCP vs ortho regarding hip pain that is inhibiting ambulation; until then, PFPT to help improve pelvic floor strength. Continue improving glycemic control Review UA from today from urine she reports has been provided.  - Ambulatory referral to Physical Therapy   2. Language barrier In person ASL interpreter for encounter  3. Vulvar itching Recommend use of terconazole for 1 week continuously. If no improvement, will examine vulva for alternate causes of vulvar pruritus.    Routine preventative health maintenance measures emphasized.  Lorriane Shire, MD [facility] for [facility], [facility]

## 2023-02-26 NOTE — Progress Notes (Signed)
Patient having urinary frequency. Patient gets up one to two times a night. Some urinary incontinence making it to restroom. Armandina Stammer RN

## 2023-03-01 ENCOUNTER — Ambulatory Visit (INDEPENDENT_AMBULATORY_CARE_PROVIDER_SITE_OTHER): Payer: Federal, State, Local not specified - PPO | Admitting: Pulmonary Disease

## 2023-03-01 ENCOUNTER — Encounter: Payer: Self-pay | Admitting: Pulmonary Disease

## 2023-03-01 VITALS — BP 130/70 | HR 73 | Ht 62.0 in | Wt 210.4 lb

## 2023-03-01 DIAGNOSIS — Z6841 Body Mass Index (BMI) 40.0 and over, adult: Secondary | ICD-10-CM | POA: Diagnosis not present

## 2023-03-01 DIAGNOSIS — J4520 Mild intermittent asthma, uncomplicated: Secondary | ICD-10-CM | POA: Diagnosis not present

## 2023-03-01 DIAGNOSIS — J4540 Moderate persistent asthma, uncomplicated: Secondary | ICD-10-CM | POA: Diagnosis not present

## 2023-03-01 DIAGNOSIS — J301 Allergic rhinitis due to pollen: Secondary | ICD-10-CM

## 2023-03-01 NOTE — Patient Instructions (Signed)
Thank you for visiting Dr. Tonia Brooms at [facility]. Today we recommend the following:  Breztri inhaler coupon Work note  Return in about 1 year (around 02/29/2024), or if symptoms worsen or fail to improve, for with APP or Dr. Tonia Brooms.    Please do your part to reduce the spread of COVID-19.

## 2023-03-01 NOTE — Progress Notes (Signed)
Synopsis: Referred in January 2020 as a self-referral for shortness of breath patient's PCP RU:EAVWU Irish Elders, *  Subjective:   PATIENT ID: Rhonda Morrow GENDER: female DOB: 1961-09-24, MRN: [id number]  Chief Complaint  Patient presents with   Follow-up    F/up asthma    This is a 62 year old that was recently seen in the emergency room for shortness of breath as well as vomiting.  She was diagnosed with a flulike illness and otherwise has been doing well since then.  She has a diagnosis of asthma since childhood.  She currently works at the [employer].  She has noticed increased shortness of breath with exertion.  She has been using her albuterol nebulizer as needed.  She does state that her husband put 3 vials of albuterol in her nebulizer which made her very tremulous.  She has been using Dulera but finds the inhaler expensive.  She does think that her symptoms from her asthma are okay controlled but have flared up since her recent illness.  She is slowly getting better.  Her shortness of breath only present with significant exertion.  Patient denies chest pain, nausea vomiting, diarrhea, hemoptysis.  OV 12/17/2018: Was at therapy yesterday for her should and developed SOB. She difficulty breathing and noticed chest tightness. Ended up going home and later last night she as having more SOB and chest tightness.  Further investigation of the symptoms appear to have only started when she was walking briskly on a treadmill.  She started to feel significantly short of breath and inability to catch her breath.  She had to stop this exercise.  She sat rested for a few minutes and then decided to attempt lifting weights.  Lifting weights she also experienced dyspnea.  Patient only denies chest tightness and the ability to catch her breath.  Of note she did not take any of her inhaler medications prior to her attempt at exercising.  Once she took her inhalers when she got home she felt much  better and her symptoms dissipated she also felt better with the use of her nebulizer in the middle of the night because she was having some trouble sleeping.  She also did not take her inhaler medications this morning prior to coming to the office today.  Does not appear to be that she has been using them on a regular basis and she has only been using them "as needed".  OV 11/15/2020: Patient last seen in our office on 04/01/2020, Elisha Headland, NP.  At the time using albuterol for her asthma.  Not using Symbicort.  Was counseled on resuming Symbicort 160.  Here today for asthma follow-up. Patient tries to be compliant with her Symbicort. Sometimes she forgets her evening dose. She does remember to take it in the morning. She is rarely using her albuterol inhaler. But she brings it with her in case of emergencies. No recent exacerbations. Working full-time at the [employer]. She said it has been very hectic recently at work.  OV 01/11/2022: Here today for follow-up regarding mild intermittent asthma symptoms.  Accompanied by ASL interpreter.  Overall doing well.  She still has shortness of breath and occasional difficulty breathing.  She intermittently has been using inhalers.  Unfortunately ran out of her inhaler started having more shortness of breath while at work.  She is working full-time at the post office.  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 Patient has been using her inhalers PRN. She usually remembers to take the breztri in the AM. Often forgets the medication in the evening. She rarely uses her albuterol.     Past Medical History:  Diagnosis Date   Arthritis    Asthma    Complication of anesthesia    one time woke up and was vey scared,17 yrs ago   Deaf    Diabetes  mellitus without complication    GERD (gastroesophageal reflux disease)    Hypertension    Left groin pain    Nausea with vomiting 02/19/2014   Thyroid disease      Family History  Problem Relation Age of Onset   Diabetes Mother    Heart disease Mother    Diabetes Father      Past Surgical History:  Procedure Laterality Date   CARDIAC CATHETERIZATION     CESAREAN SECTION     CHOLECYSTECTOMY N/A 06/20/2016   Procedure: LAPAROSCOPIC CHOLECYSTECTOMY;  Surgeon: Emelia Loron, MD;  Location: [facility] OR;  Service: General;  Laterality: N/A;   ESOPHAGEAL MANOMETRY N/A 09/29/2013   Procedure: ESOPHAGEAL MANOMETRY (EM);  Surgeon: Rachael Fee, MD;  Location: [facility] ENDOSCOPY;  Service: Endoscopy;  Laterality: N/A;   TOTAL HIP ARTHROPLASTY Left 02/21/2017   Procedure: LEFT TOTAL HIP ARTHROPLASTY ANTERIOR APPROACH;  Surgeon: Tarry Kos, MD;  Location: [facility] OR;  Service: Orthopedics;  Laterality: Left;    Social History   Socioeconomic History   Marital status: Married    Spouse name: Not on file   Number of children: Not on file   Years of education: Not on file   Highest education level: Not on file  Occupational History   Occupation: disabled  Tobacco Use   Smoking status: Never   Smokeless tobacco: Never  Vaping Use   Vaping Use: Never used  Substance and Sexual Activity   Alcohol use: No    Alcohol/week: 0.0 standard drinks of alcohol   Drug use: No   Sexual activity: Not Currently  Other Topics Concern   Not on file  Social History Narrative   Lives with family.  Has 3 children.  Works for [employer]  Education: high school.   Social Determinants of Health   Financial Resource Strain: Not on file  Food Insecurity: Not on file  Transportation Needs: Not on file  Physical Activity: Not on file  Stress: Not on file  Social Connections: Not on file  Intimate Partner Violence: Not on file     Allergies  Allergen Reactions   Losartan Shortness Of Breath    Oxycodone-Acetaminophen Nausea And Vomiting   Augmentin [Amoxicillin-Pot Clavulanate] Diarrhea     Outpatient Medications Prior to Visit  Medication Sig Dispense Refill   albuterol (VENTOLIN HFA) 108 (90 Base) MCG/ACT inhaler Inhale 2 puffs into the lungs every 6 (six) hours as needed for wheezing or shortness of breath. 8 g 6   allopurinol (ZYLOPRIM) 100 MG tablet TAKE 1 TABLET BY MOUTH EVERY DAY 90 tablet 3   amLODipine (NORVASC) 5 MG tablet TAKE 1 TABLET (5 MG TOTAL) BY MOUTH DAILY. 90 tablet 1   Azelastine HCl 137 MCG/SPRAY SOLN PLACE 2 SPRAYS INTO BOTH NOSTRILS 2 (TWO) TIMES DAILY. USE IN EACH NOSTRIL AS DIRECTED 30 mL 3   Budeson-Glycopyrrol-Formoterol (BREZTRI AEROSPHERE) 160-9-4.8 MCG/ACT AERO Inhale 2 puffs into the lungs  in the morning and at bedtime. 1 each 11   cetirizine (ZYRTEC) 10 MG tablet TAKE 1 TABLET BY MOUTH EVERY DAY 90 tablet 1   cyclobenzaprine (FLEXERIL) 10 MG tablet Take 1 tablet (10 mg total) by mouth 3 (three) times daily as needed for muscle spasms. 30 tablet 0   meloxicam (MOBIC) 7.5 MG tablet TAKE 1 TO 2 TABLETS BY MOUTH DAILY AS NEEDED 60 tablet 1   metFORMIN (GLUCOPHAGE-XR) 500 MG 24 hr tablet TAKE 2 TABLETS BY MOUTH EVERY DAY WITH BREAKFAST 180 tablet 1   omeprazole (PRILOSEC) 20 MG capsule TAKE 1 CAPSULE (20 MG TOTAL) BY MOUTH 2 (TWO) TIMES DAILY BEFORE A MEAL. 180 capsule 1   promethazine-dextromethorphan (PROMETHAZINE-DM) 6.25-15 MG/5ML syrup Take 5 mLs by mouth 4 (four) times daily as needed. 118 mL 0   rosuvastatin (CRESTOR) 20 MG tablet Take 1 tablet (20 mg total) by mouth at bedtime. 90 tablet 1   solifenacin (VESICARE) 5 MG tablet Take 1 tablet (5 mg total) by mouth daily. 30 tablet 2   terconazole (TERAZOL 7) 0.4 % vaginal cream Place 1 applicator vaginally at bedtime. 45 g 0   blood glucose meter kit and supplies KIT Dispense based on patient and insurance preference. Use up to four times daily as directed. (FOR ICD-9 250.00, 250.01). (Patient not  taking: Reported on 11/13/2022) 1 each 0   Budeson-Glycopyrrol-Formoterol (BREZTRI AEROSPHERE) 160-9-4.8 MCG/ACT AERO Inhale 2 puffs into the lungs in the morning and at bedtime. (Patient not taking: Reported on 03/01/2023) 10.7 g 0   oseltamivir (TAMIFLU) 75 MG capsule Take 1 capsule (75 mg total) by mouth 2 (two) times daily. (Patient not taking: Reported on 02/26/2023) 10 capsule 0   Semaglutide (RYBELSUS) 3 MG TABS Take 1 tablet (3 mg total) by mouth daily. (Patient not taking: Reported on 03/01/2023) 30 tablet 0   Facility-Administered Medications Prior to Visit  Medication Dose Route Frequency Provider Last Rate Last Admin   alum & mag hydroxide-simeth (MAALOX/MYLANTA) 200-200-20 MG/5ML suspension 30 mL  30 mL Oral Once Saguier, Edward, PA-C       lidocaine (XYLOCAINE) 2 % viscous mouth solution 15 mL  15 mL Mouth/Throat Once Saguier, Edward, PA-C       nitroGLYCERIN (NITRODUR - Dosed in mg/24 hr) patch 0.2 mg  0.2 mg Transdermal Daily Lenn Sink, DPM        Review of Systems  Constitutional:  Negative for chills, fever, malaise/fatigue and weight loss.  HENT:  Negative for hearing loss, sore throat and tinnitus.   Eyes:  Negative for blurred vision and double vision.  Respiratory:  Negative for cough, hemoptysis, sputum production, shortness of breath, wheezing and stridor.   Cardiovascular:  Negative for chest pain, palpitations, orthopnea, leg swelling and PND.  Gastrointestinal:  Negative for abdominal pain, constipation, diarrhea, heartburn, nausea and vomiting.  Genitourinary:  Negative for dysuria, hematuria and urgency.  Musculoskeletal:  Negative for joint pain and myalgias.  Skin:  Negative for itching and rash.  Neurological:  Negative for dizziness, tingling, weakness and headaches.  Endo/Heme/Allergies:  Negative for environmental allergies. Does not bruise/bleed easily.  Psychiatric/Behavioral:  Negative for depression. The patient is not nervous/anxious and does not have  insomnia.   All other systems reviewed and are negative.    Objective:  Physical Exam Vitals reviewed.  Constitutional:      General: She is not in acute distress.    Appearance: She is well-developed. She is obese.  HENT:     Head:  Normocephalic and atraumatic.  Eyes:     General: No scleral icterus.    Conjunctiva/sclera: Conjunctivae normal.     Pupils: Pupils are equal, round, and reactive to light.  Neck:     Vascular: No JVD.     Trachea: No tracheal deviation.  Cardiovascular:     Rate and Rhythm: Normal rate and regular rhythm.     Heart sounds: Normal heart sounds. No murmur heard. Pulmonary:     Effort: Pulmonary effort is normal. No tachypnea, accessory muscle usage or respiratory distress.     Breath sounds: No stridor. No wheezing, rhonchi or rales.  Abdominal:     General: There is no distension.     Palpations: Abdomen is soft.     Tenderness: There is no abdominal tenderness.  Musculoskeletal:        General: No tenderness.     Cervical back: Neck supple.  Lymphadenopathy:     Cervical: No cervical adenopathy.  Skin:    General: Skin is warm and dry.     Capillary Refill: Capillary refill takes less than 2 seconds.     Findings: No rash.  Neurological:     Mental Status: She is alert and oriented to person, place, and time.  Psychiatric:        Behavior: Behavior normal.      Vitals:   03/01/23 1316  BP: 130/70  Pulse: 73  SpO2: 99%  Weight: 210 lb 6.4 oz (95.4 kg)  Height:  (1.575 m)   99% on RA BMI Readings from Last 3 Encounters:  03/01/23 38.48 kg/m  02/26/23 38.59 kg/m  01/30/23 39.69 kg/m   Wt Readings from Last 3 Encounters:  03/01/23 210 lb 6.4 oz (95.4 kg)  02/26/23 211 lb (95.7 kg)  12/13/22 217 lb (98.4 kg)     CBC    Component Value Date/Time   WBC 6.7 12/14/2022 1536   RBC 5.07 12/14/2022 1536   HGB 14.0 12/14/2022 1536   HCT 42.7 12/14/2022 1536   PLT 129.0 (L) 12/14/2022 1536   MCV 84.2 12/14/2022 1536    MCH 27.2 08/18/2022 1604   MCHC 32.7 12/14/2022 1536   RDW 15.4 12/14/2022 1536   LYMPHSABS 2.1 12/14/2022 1536   MONOABS 0.8 12/14/2022 1536   EOSABS 0.1 12/14/2022 1536   BASOSABS 0.1 12/14/2022 1536   Chest Imaging: Chest x-ray 11/17/2016: Enlarged cardiac silhouette, no pulmonary edema The patient's images have been independently reviewed by me.    Pulmonary Functions Testing Results:    Latest Ref Rng & Units 01/22/2019    2:35 PM 07/21/2013   12:03 PM  PFT Results  FVC-Pre L 1.60    FVC-Predicted Pre % 61  56      FVC-Post L 1.66    FVC-Predicted Post % 63    Pre FEV1/FVC % % 89  89      Post FEV1/FCV % % 88    FEV1-Pre L 1.42  1.37      FEV1-Predicted Pre % 69  62      FEV1-Post L 1.45    DLCO uncorrected ml/min/mmHg 17.81    DLCO UNC% % 91    DLCO corrected ml/min/mmHg 17.03    DLCO COR %Predicted % 87    DLVA Predicted % 148    TLC L 3.13    TLC % Predicted % 65    RV % Predicted % 65       This result is from an external source.  FeNO: No results found for: "NITRICOXIDE"  Pathology: None   Echocardiogram: 04/12/2016: Preserved EF 60-65%  Heart Catheterization: None     Assessment & Plan:   Asthma, moderate persistent, poorly-controlled  Seasonal allergic rhinitis due to pollen  Mild intermittent asthma, unspecified whether complicated  BMI 40.0-44.9, adult  Discussion:  This is a 62 year old female here today for moderate persistent asthma and obesity follow-up.  Had been using Symbicort with breakthrough symptoms.  Now switched to [facility] to help with inhaler cost to be able to use a $0 co-pay card.  Plan: Continue Breztri New prescription co-pay card. Continue albuterol as needed. She is currently on weight loss medications as well.  Continue to follow-up with [place] in 1 year or as needed. Will supply work note today in the office.    Current Outpatient Medications:    albuterol (VENTOLIN HFA) 108 (90 Base) MCG/ACT inhaler, Inhale 2  puffs into the lungs every 6 (six) hours as needed for wheezing or shortness of breath., Disp: 8 g, Rfl: 6   allopurinol (ZYLOPRIM) 100 MG tablet, TAKE 1 TABLET BY MOUTH EVERY DAY, Disp: 90 tablet, Rfl: 3   amLODipine (NORVASC) 5 MG tablet, TAKE 1 TABLET (5 MG TOTAL) BY MOUTH DAILY., Disp: 90 tablet, Rfl: 1   Azelastine HCl 137 MCG/SPRAY SOLN, PLACE 2 SPRAYS INTO BOTH NOSTRILS 2 (TWO) TIMES DAILY. USE IN EACH NOSTRIL AS DIRECTED, Disp: 30 mL, Rfl: 3   Budeson-Glycopyrrol-Formoterol (BREZTRI AEROSPHERE) 160-9-4.8 MCG/ACT AERO, Inhale 2 puffs into the lungs in the morning and at bedtime., Disp: 1 each, Rfl: 11   cetirizine (ZYRTEC) 10 MG tablet, TAKE 1 TABLET BY MOUTH EVERY DAY, Disp: 90 tablet, Rfl: 1   cyclobenzaprine (FLEXERIL) 10 MG tablet, Take 1 tablet (10 mg total) by mouth 3 (three) times daily as needed for muscle spasms., Disp: 30 tablet, Rfl: 0   meloxicam (MOBIC) 7.5 MG tablet, TAKE 1 TO 2 TABLETS BY MOUTH DAILY AS NEEDED, Disp: 60 tablet, Rfl: 1   metFORMIN (GLUCOPHAGE-XR) 500 MG 24 hr tablet, TAKE 2 TABLETS BY MOUTH EVERY DAY WITH BREAKFAST, Disp: 180 tablet, Rfl: 1   omeprazole (PRILOSEC) 20 MG capsule, TAKE 1 CAPSULE (20 MG TOTAL) BY MOUTH 2 (TWO) TIMES DAILY BEFORE A MEAL., Disp: 180 capsule, Rfl: 1   promethazine-dextromethorphan (PROMETHAZINE-DM) 6.25-15 MG/5ML syrup, Take 5 mLs by mouth 4 (four) times daily as needed., Disp: 118 mL, Rfl: 0   rosuvastatin (CRESTOR) 20 MG tablet, Take 1 tablet (20 mg total) by mouth at bedtime., Disp: 90 tablet, Rfl: 1   solifenacin (VESICARE) 5 MG tablet, Take 1 tablet (5 mg total) by mouth daily., Disp: 30 tablet, Rfl: 2   terconazole (TERAZOL 7) 0.4 % vaginal cream, Place 1 applicator vaginally at bedtime., Disp: 45 g, Rfl: 0   blood glucose meter kit and supplies KIT, Dispense based on patient and insurance preference. Use up to four times daily as directed. (FOR ICD-9 250.00, 250.01). (Patient not taking: Reported on 11/13/2022), Disp: 1 each, Rfl:  0   Budeson-Glycopyrrol-Formoterol (BREZTRI AEROSPHERE) 160-9-4.8 MCG/ACT AERO, Inhale 2 puffs into the lungs in the morning and at bedtime. (Patient not taking: Reported on 03/01/2023), Disp: 10.7 g, Rfl: 0   oseltamivir (TAMIFLU) 75 MG capsule, Take 1 capsule (75 mg total) by mouth 2 (two) times daily. (Patient not taking: Reported on 02/26/2023), Disp: 10 capsule, Rfl: 0   Semaglutide (RYBELSUS) 3 MG TABS, Take 1 tablet (3 mg total) by mouth daily. (Patient not taking: Reported on 03/01/2023), Disp:  30 tablet, Rfl: 0  Current Facility-Administered Medications:    alum & mag hydroxide-simeth (MAALOX/MYLANTA) 200-200-20 MG/5ML suspension 30 mL, 30 mL, Oral, Once, Saguier, Edward, PA-C   lidocaine (XYLOCAINE) 2 % viscous mouth solution 15 mL, 15 mL, Mouth/Throat, Once, Saguier, Edward, PA-C   nitroGLYCERIN (NITRODUR - Dosed in mg/24 hr) patch 0.2 mg, 0.2 mg, Transdermal, Daily, Regal, Kirstie Peri, DPM   Josephine Igo, DO [facility] 03/01/2023 1:36 PM

## 2023-03-20 ENCOUNTER — Encounter: Payer: Self-pay | Admitting: Family Medicine

## 2023-03-20 ENCOUNTER — Ambulatory Visit (INDEPENDENT_AMBULATORY_CARE_PROVIDER_SITE_OTHER): Payer: Federal, State, Local not specified - PPO | Admitting: Family Medicine

## 2023-03-20 VITALS — BP 137/65 | HR 70 | Temp 97.5°F | Ht 62.0 in | Wt 211.0 lb

## 2023-03-20 DIAGNOSIS — J069 Acute upper respiratory infection, unspecified: Secondary | ICD-10-CM

## 2023-03-20 DIAGNOSIS — J4540 Moderate persistent asthma, uncomplicated: Secondary | ICD-10-CM

## 2023-03-20 MED ORDER — PREDNISONE 20 MG PO TABS: 40.0000 mg | ORAL_TABLET | Freq: Every day | ORAL | 0 refills | Status: AC

## 2023-03-20 MED ORDER — FLUTICASONE PROPIONATE 50 MCG/ACT NA SUSP: 2.0000 | Freq: Every day | NASAL | 6 refills | Status: DC

## 2023-03-20 MED ORDER — BENZONATATE 200 MG PO CAPS: 200.0000 mg | ORAL_CAPSULE | Freq: Two times a day (BID) | ORAL | 0 refills | Status: DC | PRN

## 2023-03-20 NOTE — Patient Instructions (Signed)
Likely allergic or viral trigger of your symptoms.  Take a daily allergy medicine like Claritin, Zyrtec, etc.  Adding prednisone to help your breathing, cough medicine, and nose sprays.  Continue supportive measures including rest, hydration, humidifier use, steam showers, warm compresses to sinuses, warm liquids with lemon and honey, and over-the-counter cough, cold, and analgesics as needed.   If not feeling better by the end of this week, please let us know so we can consider starting antibiotics.   Please contact office for follow-up if symptoms do not improve or worsen. Seek emergency care if symptoms become severe.

## 2023-03-20 NOTE — Progress Notes (Signed)
Acute Office Visit  Subjective:     Patient ID: Rhonda Morrow, female    DOB: Mar 18, 1961, 62 y.o.   MRN: [id number]  No chief complaint on file.   HPI Patient is in today for URI, cough.   She reports that last Friday at work, they were using some new products and the smell started irritating her, causing her to cough. He son also has a cold around that time and was coughing/sneezing at home. Her symptoms  have included dry cough (worse at night and when lying down), rhinorrhea, postnasal drainage, fatigue, sneezing, dyspnea at times. She had to use albuterol 2 times at work, but has not needed since then. She has gotten some improvement with OTC cough/cold medicine. She denies headaches, fevers, chills, body aches, chest pain, GI/GU changes.         ROS All review of systems negative except what is listed in the HPI      Objective:    BP 137/65   Pulse 70   Temp (!) 97.5 F (36.4 C) (Oral)   Ht 5\' 2"  (1.575 m)   Wt 211 lb (95.7 kg)   LMP 05/20/2012   SpO2 99%   BMI 38.59 kg/m    Physical Exam Vitals reviewed.  Constitutional:      Appearance: Normal appearance.  HENT:     Head: Normocephalic and atraumatic.     Right Ear: Tympanic membrane normal.     Left Ear: Tympanic membrane normal.  Cardiovascular:     Rate and Rhythm: Normal rate and regular rhythm.     Pulses: Normal pulses.     Heart sounds: Normal heart sounds.  Pulmonary:     Effort: Pulmonary effort is normal.     Breath sounds: Normal breath sounds.     Comments: Faint wheeze Musculoskeletal:     Cervical back: Normal range of motion and neck supple. No tenderness.  Lymphadenopathy:     Cervical: No cervical adenopathy.  Skin:    General: Skin is warm and dry.  Neurological:     Mental Status: She is alert and oriented to person, place, and time.  Psychiatric:        Mood and Affect: Mood normal.        Behavior: Behavior normal.        Thought Content: Thought content normal.         Judgment: Judgment normal.     No results found for any visits on 03/20/23.      Assessment & Plan:   Problem List Items Addressed This Visit     Moderate persistent asthma   Relevant Medications   predniSONE (DELTASONE) 20 MG tablet   Other Visit Diagnoses     Viral URI with cough    -  Primary   Relevant Medications   predniSONE (DELTASONE) 20 MG tablet   fluticasone (FLONASE) 50 MCG/ACT nasal spray   benzonatate (TESSALON) 200 MG capsule      Likely allergic or viral trigger of your symptoms.  Take a daily allergy medicine like Claritin, Zyrtec, etc.  Adding prednisone to help your breathing, cough medicine, and nose spray.  Continue supportive measures including rest, hydration, humidifier use, steam showers, warm compresses to sinuses, warm liquids with lemon and honey, and over-the-counter cough, cold, and analgesics as needed.  If not feeling better by the end of this week, please let us know so we can consider starting antibiotics.  Patient aware of signs/symptoms requiring further/urgent evaluation.  Meds ordered this encounter  Medications   predniSONE (DELTASONE) 20 MG tablet    Sig: Take 2 tablets (40 mg total) by mouth daily with breakfast for 5 days.    Dispense:  10 tablet    Refill:  0    Order Specific Question:   Supervising Provider    Answer:   Danise Edge A [[id number]]   fluticasone (FLONASE) 50 MCG/ACT nasal spray    Sig: Place 2 sprays into both nostrils daily.    Dispense:  16 g    Refill:  6    Order Specific Question:   Supervising Provider    Answer:   Danise Edge A [[id number]]   benzonatate (TESSALON) 200 MG capsule    Sig: Take 1 capsule (200 mg total) by mouth 2 (two) times daily as needed for cough.    Dispense:  20 capsule    Refill:  0    Order Specific Question:   Supervising Provider    Answer:   Danise Edge A [[id number]]    Return if symptoms worsen or fail to improve.  Clayborne Dana, NP

## 2023-03-22 ENCOUNTER — Encounter: Payer: Self-pay | Admitting: Family Medicine

## 2023-03-22 DIAGNOSIS — J40 Bronchitis, not specified as acute or chronic: Secondary | ICD-10-CM

## 2023-03-23 MED ORDER — AZITHROMYCIN 250 MG PO TABS: ORAL_TABLET | ORAL | 0 refills | Status: AC

## 2023-03-23 NOTE — Telephone Encounter (Signed)
From last office note:  "Likely allergic or viral trigger of your symptoms.  Take a daily allergy medicine like Claritin, Zyrtec, etc.  Adding prednisone to help your breathing, cough medicine, and nose sprays.  Continue supportive measures including rest, hydration, humidifier use, steam showers, warm compresses to sinuses, warm liquids with lemon and honey, and over-the-counter cough, cold, and analgesics as needed.    If not feeling better by the end of this week, please let us know so we can consider starting antibiotics."

## 2023-04-03 ENCOUNTER — Ambulatory Visit (INDEPENDENT_AMBULATORY_CARE_PROVIDER_SITE_OTHER): Payer: Federal, State, Local not specified - PPO | Admitting: Family Medicine

## 2023-04-03 ENCOUNTER — Ambulatory Visit (INDEPENDENT_AMBULATORY_CARE_PROVIDER_SITE_OTHER): Payer: Federal, State, Local not specified - PPO

## 2023-04-03 ENCOUNTER — Other Ambulatory Visit: Payer: Self-pay

## 2023-04-03 ENCOUNTER — Encounter: Payer: Self-pay | Admitting: Family Medicine

## 2023-04-03 VITALS — BP 108/82 | HR 64 | Ht 62.0 in | Wt 213.6 lb

## 2023-04-03 DIAGNOSIS — R1031 Right lower quadrant pain: Secondary | ICD-10-CM | POA: Diagnosis not present

## 2023-04-03 DIAGNOSIS — M25551 Pain in right hip: Secondary | ICD-10-CM | POA: Diagnosis not present

## 2023-04-03 NOTE — Progress Notes (Signed)
I, Stevenson Clinch, CMA acting as a scribe for [name], MD.  Rhonda Morrow is a 62 y.o. female who presents to [facility] at [facility] today for f/u. Pt was last seen by Dr. Denyse Amass on 10/09/22 for R knee pain.   Today, pt reports right groin pain. Sx worse when lifting the right leg. Feels like the hip will give out with ambulation. Hx of hip replacement surgery. MOI unknown but has hx of frequent falls. Sx present x 4 weeks, causing a limp. No relief with Tylenol, short-term relief with Meloxicam. Sx worse with prolonged time walking.   She has a history of left total hip replacement.  Pertinent review of systems: No fevers or chills  Relevant historical information: Left total hip replacement.  Deaf using an ASL interpreter to communicate.   Exam:  BP 108/82   Pulse 64   Ht 5\' 2"  (1.575 m)   Wt 213 lb 9.6 oz (96.9 kg)   LMP 05/20/2012   SpO2 98%   BMI 39.07 kg/m  General: Well Developed, well nourished, and in no acute distress.   MSK: Right hip normal-appearing decreased range of motion pain with flexion and rotation.  Strength limited pain with resisted strength testing. Antalgic gait present.    Lab and Radiology Results  Procedure: Real-time Ultrasound Guided Injection of right hip intra-articular joint Device: Philips Affiniti 50G Images permanently stored and available for review in PACS Verbal informed consent obtained.  Discussed risks and benefits of procedure. Warned about infection, bleeding, hyperglycemia damage to structures among others. Patient expresses understanding and agreement Time-out conducted.   Noted no overlying erythema, induration, or other signs of local infection.   Skin prepped in a sterile fashion.   Local anesthesia: Topical Ethyl chloride.   With sterile technique and under real time ultrasound guidance: 40 mg of Kenalog and 2 mL of Marcaine injected into hip joint. Fluid seen entering the joint capsule.   Completed  without difficulty   Pain moderately  resolved suggesting accurate placement of the medication.   Advised to call if fevers/chills, erythema, induration, drainage, or persistent bleeding.   Images permanently stored and available for review in the ultrasound unit.  Impression: Technically successful ultrasound guided injection.    X-ray images right hip obtained today personally and independently interpreted Mild right hip DJD.  No acute fractures are visible. This status post left total hip replacement. Await formal radiology review     Assessment and Plan: 62 y.o. female with right groin pain thought to be related to hip DJD.  There may be of hip flexor tendinitis component as well.  Plan for trial of steroid injection.  Recheck as needed.  Visit conducted using ASL interpreter.   PDMP not reviewed this encounter. Orders Placed This Encounter  Procedures   [place] LIMITED JOINT SPACE STRUCTURES LOW RIGHT(NO LINKED CHARGES)    Order Specific Question:   Reason for Exam (SYMPTOM  OR DIAGNOSIS REQUIRED)    Answer:   right groin pain    Order Specific Question:   Preferred imaging location?    Answer:   [facility] HIP UNILAT W OR W/O PELVIS 2-3 VIEWS RIGHT    Standing Status:   Future    Number of Occurrences:   1    Standing Expiration Date:   05/04/2023    Order Specific Question:   Reason for Exam (SYMPTOM  OR DIAGNOSIS REQUIRED)    Answer:   right  hip pain    Order Specific Question:   Preferred imaging location?    Answer:   [facility]    Order Specific Question:   Is patient pregnant?    Answer:   No   No orders of the defined types were placed in this encounter.    Discussed warning signs or symptoms. Please see discharge instructions. Patient expresses understanding.   The above documentation has been reviewed and is accurate and complete [name], M.D.

## 2023-04-03 NOTE — Patient Instructions (Signed)
Thank you for coming in today.   Please get an Xray today before you leave   You received an injection today. Seek immediate medical attention if the joint becomes red, extremely painful, or is oozing fluid.   Check back as needed  

## 2023-04-06 ENCOUNTER — Encounter: Payer: Self-pay | Admitting: Family Medicine

## 2023-04-06 MED ORDER — HYDROCODONE-ACETAMINOPHEN 5-325 MG PO TABS: 1.0000 | ORAL_TABLET | Freq: Four times a day (QID) | ORAL | 0 refills | Status: DC | PRN

## 2023-04-09 ENCOUNTER — Ambulatory Visit: Payer: Federal, State, Local not specified - PPO | Admitting: Obstetrics and Gynecology

## 2023-04-09 ENCOUNTER — Encounter: Payer: Self-pay | Admitting: Obstetrics and Gynecology

## 2023-04-09 VITALS — BP 146/67 | HR 65 | Wt 215.0 lb

## 2023-04-09 DIAGNOSIS — N3946 Mixed incontinence: Secondary | ICD-10-CM | POA: Diagnosis not present

## 2023-04-09 DIAGNOSIS — M791 Myalgia, unspecified site: Secondary | ICD-10-CM | POA: Diagnosis not present

## 2023-04-09 NOTE — Progress Notes (Unsigned)
GYNECOLOGY VISIT  Patient name: Rhonda Morrow MRN [id number]  Date of birth: 10-Jul-1961 Chief Complaint:   Follow-up and Urinary Incontinence  History:  Rhonda Morrow is a 62 y.o. [phone] being seen today for follow up of urinary issues.  Putting iton the outside of the vagina, the cream, and sometimes if she is having itching she will apply. Used it for about 3 days. Had itching yesterday on the oustide, no vaginal discharge  Past Medical History:  Diagnosis Date   Arthritis    Asthma    Complication of anesthesia    one time woke up and was vey scared,17 yrs ago   Deaf    Diabetes mellitus without complication (HCC)    GERD (gastroesophageal reflux disease)    Hypertension    Left groin pain    Nausea with vomiting 02/19/2014   Thyroid disease     Past Surgical History:  Procedure Laterality Date   CARDIAC CATHETERIZATION     CESAREAN SECTION     CHOLECYSTECTOMY N/A 06/20/2016   Procedure: LAPAROSCOPIC CHOLECYSTECTOMY;  Surgeon: Emelia Loron, MD;  Location: [facility] OR;  Service: General;  Laterality: N/A;   ESOPHAGEAL MANOMETRY N/A 09/29/2013   Procedure: ESOPHAGEAL MANOMETRY (EM);  Surgeon: Rachael Fee, MD;  Location: [facility] ENDOSCOPY;  Service: Endoscopy;  Laterality: N/A;   TOTAL HIP ARTHROPLASTY Left 02/21/2017   Procedure: LEFT TOTAL HIP ARTHROPLASTY ANTERIOR APPROACH;  Surgeon: Tarry Kos, MD;  Location: [facility] OR;  Service: Orthopedics;  Laterality: Left;    The following portions of the patient's history were reviewed and updated as appropriate: allergies, current medications, past family history, past medical history, past social history, past surgical history and problem list.   Health Maintenance:   Last pap     Component Value Date/Time   DIAGPAP  11/13/2022 1148    - Negative for intraepithelial lesion or malignancy (NILM)   DIAGPAP  03/03/2020 1335    - Negative for intraepithelial lesion or malignancy (NILM)   HPVHIGH Negative 11/13/2022 1148   HPVHIGH  Negative 03/03/2020 1335   ADEQPAP  11/13/2022 1148    Satisfactory for evaluation; transformation zone component PRESENT.   ADEQPAP  03/03/2020 1335    Satisfactory for evaluation; transformation zone component PRESENT.    High Risk HPV: Positive  Adequacy:  Satisfactory for evaluation, transformation zone component PRESENT  Diagnosis:  Atypical squamous cells of undetermined significance (ASC-US)  Last mammogram: n/a   Review of Systems:  Pertinent items are noted in HPI. Comprehensive review of systems was otherwise negative.   Objective:  Physical Exam BP (!) 146/67   Pulse 65   Wt 215 lb (97.5 kg)   LMP 05/20/2012   BMI 39.32 kg/m    Physical Exam Vitals and nursing note reviewed. Exam conducted with a chaperone present.  Constitutional:      Appearance: Normal appearance.  HENT:     Head: Normocephalic and atraumatic.  Pulmonary:     Effort: Pulmonary effort is normal.     Breath sounds: Normal breath sounds.  Genitourinary:    General: Normal vulva.     Exam position: Lithotomy position.     Vagina: Normal.     Cervix: Normal.     Comments: Normal appearing vulva Normal vulvar sensation bilaterally Nontender superficial pelvic floor muscles Nontender ischial tuberosities bilaterally  Posterior vaginal wall nontender Right levator ani tender Right ischiococcygeous tender Left levator ani tender Left ischioccocygeous tender Kegel 0/5   Skin:    General:  Skin is warm and dry.  Neurological:     General: No focal deficit present.     Mental Status: She is alert.  Psychiatric:        Mood and Affect: Mood normal.        Behavior: Behavior normal.        Thought Content: Thought content normal.        Judgment: Judgment normal.      Labs and Imaging DG HIP UNILAT W OR W/O PELVIS 2-3 VIEWS RIGHT  Result Date: 04/06/2023 CLINICAL DATA:  Right hip pain, gradually worsening over the past month. History of total left hip arthroplasty in 2018. EXAM:  DG HIP (WITH OR WITHOUT PELVIS) 2-3V RIGHT COMPARISON:  Bilateral hip radiographs 08/16/2021 FINDINGS: There is again diffuse decreased bone mineralization. Status post total left hip arthroplasty. The distal aspect of the femoral stem is not imaged. No perihardware lucency is seen to indicate hardware failure or loosening. Mild right femoroacetabular joint space narrowing. Mild-to-moderate superolateral right acetabular degenerative osteophytosis, unchanged. There is unchanged lucency within the superolateral right acetabulum, likely subchondral degenerative cystic change. Mild to moderate left and mild right inferior sacroiliac subchondral sclerosis and joint space narrowing, unchanged from prior. Minimal superior pubic symphysis degenerative spurring. No acute fracture or dislocation. IMPRESSION: 1. Status post total left hip arthroplasty without evidence of hardware failure. 2. Mild right femoroacetabular osteoarthritis, unchanged from prior. Electronically Signed   By: Neita Garnet M.D.   On: 04/06/2023 16:14       Assessment & Plan:   1. Mixed stress and urge urinary incontinence UA without evidence of infection. PFPT starting tomorrow, will assess response in 2-3 months.  - POCT urinalysis dipstick  2. Myalgia Mild pelvic floor myalgia. Has PFPT scheduled and can be addressed then as well.     Lorriane Shire, MD [facility] for [facility], [facility]

## 2023-04-09 NOTE — Progress Notes (Signed)
Right hip x-ray shows some arthritis in the right hip joint and prior left hip replacement.

## 2023-04-09 NOTE — Progress Notes (Signed)
Pt presents for f/u. Statrts PT tomorrow. Pt has not started medication yet. No changes.

## 2023-04-16 ENCOUNTER — Ambulatory Visit: Payer: Federal, State, Local not specified - PPO | Attending: Obstetrics and Gynecology | Admitting: Physical Therapy

## 2023-04-16 ENCOUNTER — Telehealth: Payer: Self-pay | Admitting: Family Medicine

## 2023-04-16 ENCOUNTER — Other Ambulatory Visit: Payer: Self-pay

## 2023-04-16 DIAGNOSIS — M6281 Muscle weakness (generalized): Secondary | ICD-10-CM | POA: Insufficient documentation

## 2023-04-16 DIAGNOSIS — R293 Abnormal posture: Secondary | ICD-10-CM | POA: Diagnosis not present

## 2023-04-16 DIAGNOSIS — M25551 Pain in right hip: Secondary | ICD-10-CM | POA: Diagnosis not present

## 2023-04-16 DIAGNOSIS — R279 Unspecified lack of coordination: Secondary | ICD-10-CM | POA: Insufficient documentation

## 2023-04-16 DIAGNOSIS — N3946 Mixed incontinence: Secondary | ICD-10-CM | POA: Insufficient documentation

## 2023-04-16 DIAGNOSIS — E1165 Type 2 diabetes mellitus with hyperglycemia: Secondary | ICD-10-CM

## 2023-04-16 DIAGNOSIS — R269 Unspecified abnormalities of gait and mobility: Secondary | ICD-10-CM | POA: Insufficient documentation

## 2023-04-16 NOTE — Therapy (Signed)
OUTPATIENT PHYSICAL THERAPY FEMALE PELVIC EVALUATION   Patient Name: Rhonda Morrow MRN: [id number] DOB:1960-11-09, 62 y.o., female Today's Date: 04/16/2023  END OF SESSION:  PT End of Session - 04/16/23 1456     Visit Number 1    Date for PT Re-Evaluation 07/17/23    Authorization Type BC[employer]    PT Start Time 1450    PT Stop Time 1535    PT Time Calculation (min) 45 min    Activity Tolerance Patient tolerated treatment well    Behavior During Therapy WFL for tasks assessed/performed             Past Medical History:  Diagnosis Date   Arthritis    Asthma    Complication of anesthesia    one time woke up and was vey scared,17 yrs ago   Deaf    Diabetes mellitus without complication (HCC)    GERD (gastroesophageal reflux disease)    Hypertension    Left groin pain    Nausea with vomiting 02/19/2014   Thyroid disease    Past Surgical History:  Procedure Laterality Date   CARDIAC CATHETERIZATION     CESAREAN SECTION     CHOLECYSTECTOMY N/A 06/20/2016   Procedure: LAPAROSCOPIC CHOLECYSTECTOMY;  Surgeon: Emelia Loron, MD;  Location: [facility] OR;  Service: General;  Laterality: N/A;   ESOPHAGEAL MANOMETRY N/A 09/29/2013   Procedure: ESOPHAGEAL MANOMETRY (EM);  Surgeon: Rachael Fee, MD;  Location: [facility];  Service: Endoscopy;  Laterality: N/A;   TOTAL HIP ARTHROPLASTY Left 02/21/2017   Procedure: LEFT TOTAL HIP ARTHROPLASTY ANTERIOR APPROACH;  Surgeon: Tarry Kos, MD;  Location: [facility] OR;  Service: Orthopedics;  Laterality: Left;   Patient Active Problem List   Diagnosis Date Noted   Urinary incontinence 01/31/2023   Vaginal itching 01/31/2023   Influenza 11/20/2022   Postmenopausal bleeding 08/18/2022   Acute non-recurrent pansinusitis 06/18/2022   Leg cramps 03/08/2022   Muscle spasm of right lower extremity 03/08/2022   Primary osteoarthritis of right hip 03/01/2022   ACE inhibitor intolerance 01/19/2022   Weakness 12/22/2021   Left lower quadrant abdominal  pain 12/22/2021   Slow transit constipation 12/22/2021   Class 3 severe obesity due to excess calories without serious comorbidity with body mass index (BMI) of 40.0 to 44.9 in adult [facility]) 07/05/2021   Restless leg syndrome 07/05/2021   Chronic seasonal allergic rhinitis due to pollen 07/05/2021   Psychophysiological insomnia 07/05/2021   Non-compliance 07/05/2021   COVID-19 07/05/2021   Right wrist pain 06/05/2021   Acute pain of left shoulder 01/24/2021   Acute pain of right shoulder 01/24/2021   Foot pain, bilateral 01/24/2021   Dyspepsia 01/24/2021   Preventative health care 01/24/2021   Right hand pain 12/29/2020   OSA (obstructive sleep apnea) 09/12/2020   Nocturia more than twice per night 08/03/2020   Non-restorative sleep 08/03/2020   Leg cramping 08/03/2020   Hyperlipidemia associated with type 2 diabetes mellitus (HCC) 07/25/2020   Large breasts 07/22/2020   RLS (restless legs syndrome) 05/17/2020   Cough 04/01/2020   Asthma, moderate persistent, poorly-controlled 04/01/2020   Medication management 04/01/2020   Menopausal symptoms 03/29/2020   Hot flashes 03/29/2020   Chronic right shoulder pain 08/11/2019   Acute non-recurrent maxillary sinusitis 08/11/2019   BMI 40.0-44.9, adult (HCC) 01/22/2019   Right lower quadrant abdominal pain 10/20/2018   Rectal pain 10/20/2018   Vitamin B 12 deficiency 03/28/2018   B12 deficiency 03/28/2018   DM (diabetes mellitus) type II uncontrolled, periph vascular  disorder 03/28/2018   Hyperlipidemia 03/28/2018   Primary osteoarthritis of left hip 02/21/2017   Status post total hip replacement, left 02/21/2017   History of laparoscopic cholecystectomy 06/20/2016   Cerumen impaction 06/09/2016   Hypersomnia 05/16/2016   Knee pain, right 05/05/2016   RUQ pain 04/09/2016   Abnormal CT scan 03/28/2016   Dyspnea and respiratory abnormalities 03/15/2016   Asthma with acute exacerbation 03/15/2016   Asthma in adult 02/25/2016   DM  (diabetes mellitus) type II controlled, neurological manifestation (HCC) 02/09/2016   Left hip pain 12/23/2015   Right hamstring muscle strain 11/18/2015   Abdominal pain, acute 09/01/2015   Acute asthma exacerbation 04/16/2015   Acute bronchitis 04/14/2015   Pap smear for cervical cancer screening 09/14/2014   Bed bug bite 05/06/2014   Allergic rhinitis 04/09/2014   Rectal itching 04/09/2014   Bladder spasm 04/09/2014   Knee pain, left 03/05/2014   Hypokalemia 02/19/2014   Nausea with vomiting 02/19/2014   Diabetes mellitus, type II (HCC) 02/19/2014   Edema 02/16/2014   Disorder of rotator cuff 11/12/2013   Routine general medical examination at a health care facility 08/20/2013   Gastroesophageal reflux disease 06/26/2013   Dysphagia, pharyngoesophageal phase 06/26/2013   Medication side effect 03/25/2013   Diarrhea 03/25/2013   Benign positional vertigo 01/30/2013   Traumatic hematoma of thigh 01/15/2013   HTN (hypertension) 09/02/2012   Dry skin 08/22/2012   External hemorrhoids 08/22/2012   Obesity 06/30/2012   Thyromegaly 05/22/2012   Back pain 05/22/2012   Elevated glucose 05/22/2012   Moderate persistent asthma 04/19/2012   Dyspnea 01/28/2012    PCP: Donato Schultz, DO  REFERRING PROVIDER: Lorriane Shire, MD    REFERRING DIAG: N39.46 (ICD-10-CM) - Mixed stress and urge urinary incontinence  THERAPY DIAG:  Muscle weakness (generalized)  Abnormal posture  Unspecified lack of coordination  Right hip pain  Abnormality of gait and mobility  Rationale for Evaluation and Treatment: Rehabilitation  ONSET DATE: several months  SUBJECTIVE:                                                                                                                                                                                           SUBJECTIVE STATEMENT: Sign language interpreter present throughout session for all communication.   Several months of urinary  leakage, a lot at night but also during the day when she gets up with an urge and can't make it there in time. Sometimes doesn't know when it happens. Not with stressors.  .  Fluid intake: Yes: water - at least 2 large bottles at work    PAIN:  Are  you having pain? Yes - sometimes has pain at Rt groin with prolonged walking and with pelvic exam. Sometimes with walking the pelvic area hurts more as well.   PRECAUTIONS: Other: hearing impaired   WEIGHT BEARING RESTRICTIONS: No  FALLS:  Has patient fallen in last 6 months? No  LIVING ENVIRONMENT: Lives with: lives with their family Lives in: House/apartment   OCCUPATION: [employer]   PLOF: Independent  PATIENT GOALS: to have less leakage  PERTINENT HISTORY:  Arthritis, deaf, DM, GERD, HTN, thyroid disease, THA Lt, C-section  Sexual abuse: No  BOWEL MOVEMENT: Pain with bowel movement: No Type of bowel movement:Type (Bristol Stool Scale) 4-5, Frequency daily, and Strain No Fully empty rectum: No Leakage: No Pads: No Fiber supplement: No  URINATION: Pain with urination: No Fully empty bladder: Yes:   Stream: Weak Urgency: Yes: worse at night and drinks a lot of water during the night.  Frequency: 1-2x at night; unsure how often during the day Leakage:  sometimes at night worse, unable to make it to the bathroom with urge;  Pads: No  INTERCOURSE: Pain with intercourse:  not active Ability to have vaginal penetration:  Yes:   Climax: not painful Marinoff Scale: 0/3  PREGNANCY: Vaginal deliveries 2 Tearing No C-section deliveries 1 Currently pregnant No  PROLAPSE: None   OBJECTIVE:   DIAGNOSTIC FINDINGS:    COGNITION: Overall cognitive status: Within functional limits for tasks assessed     SENSATION: Light touch: Appears intact Proprioception: Appears intact  MUSCLE LENGTH: Bil hamstrings and adductors limited by 50%   POSTURE: rounded shoulders, forward head, and anterior pelvic  tilt  PELVIC ALIGNMENT: Rt anterior rotation   LUMBARAROM/PROM:  A/PROM A/PROM  eval  Flexion Limited by 50%  Extension WFL  Right lateral flexion Limited by 25%  Left lateral flexion Limited by 25%  Right rotation Limited by 50%  Left rotation Limited by 50%   (Blank rows = not tested)  LOWER EXTREMITY ROM:  WFL however tightness at Rt adduction, ER and IR, and flexion but functional   LOWER EXTREMITY MMT:  Bil hip abduction 3+/5, all other hip 4-/5 and Rt side with Rt groin pain  PALPATION:   General  tightness at lumbar spine, bil gluteals, Rt ITB, hip complex. TTP Rt groin                External Perineal Exam deferred until next session                              Internal Pelvic Floor deferred until next session   Patient confirms identification and approves PT to assess internal pelvic floor and treatment yes but next session   PELVIC MMT:   MMT eval  Vaginal   Internal Anal Sphincter   External Anal Sphincter   Puborectalis   Diastasis Recti   (Blank rows = not tested)        TONE: deferred until next session   PROLAPSE: deferred until next session   TODAY'S TREATMENT:  DATE:   04/16/23 EVAL Examination completed, findings reviewed, pt educated on POC, HEP, and bladder irritants and urge drill handouts and reviewed. Pt motivated to participate in PT and agreeable to attempt recommendations.     PATIENT EDUCATION:  Education details:  HEP, and bladder irritants and urge drill  Person educated: Patient Education method: Explanation, Demonstration, Tactile cues, Verbal cues, and Handouts Education comprehension: verbalized understanding, returned demonstration, verbal cues required, tactile cues required, and needs further education  HOME EXERCISE PROGRAM: [id number]  ASSESSMENT:  CLINICAL IMPRESSION: Patient is a 62 y.o.  female  who was seen today for physical therapy evaluation and treatment for urinary leakage. Pt found to have weakness at core and bil hips, TTP and pain with MMT at Rt hip, decreased flexibility at spine and bil hips. Pt does agree to internal vaginal assessment but next session. 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 Pt states she also has back/rt hip pain with walking and sometimes  makes pelvis pain worse. Pt reports she is bothered by urinary leakage mostly with urgency and worse at night. Does drink a lot of water per pt but drinks a lot at night as she feels so dry. Pt would benefit from additional PT to further address deficits.     OBJECTIVE IMPAIRMENTS: decreased activity tolerance, decreased coordination, decreased endurance, difficulty walking, decreased strength, increased fascial restrictions, increased muscle spasms, impaired flexibility, improper body mechanics, postural dysfunction, obesity, and pain.   ACTIVITY LIMITATIONS: carrying, lifting, squatting, continence, and locomotion level  PARTICIPATION LIMITATIONS: community activity  PERSONAL FACTORS: Time since onset of injury/illness/exacerbation and 1 comorbidity: medical history  are also affecting patient's functional outcome.   REHAB POTENTIAL: Good  CLINICAL DECISION MAKING: Stable/uncomplicated  EVALUATION COMPLEXITY: Low   GOALS: Goals reviewed with patient? Yes  SHORT TERM GOALS: Target date: 05/14/23  Pt to be I with HEP.  Baseline: Goal status: INITIAL  2.  Pt to report improved time between bladder voids to at least 4 hours at night for improved QOL with decreased urinary frequency.   Baseline:  Goal status: INITIAL  3.  Pt will have 25% less urgency due to bladder retraining and strengthening  Baseline:  Goal status: INITIAL  4.  Pt to be I with urge drill for decreased  leakage instances.  Baseline:   Goal status: INITIAL   LONG TERM GOALS: Target date: 07/17/23  Pt to be I with advanced HEP.  Baseline:  Goal status: INITIAL  2.  Pt will have 50% less urgency due to bladder retraining and strengthening  Baseline:  Goal status: INITIAL  3. Pt to demonstrate at least 3/5 pelvic floor strength for improved pelvic stability and decreased strain at pelvic floor/ decrease leakage.  Baseline:  Goal status: INITIAL  4.  Pt to demonstrate improved coordination of pelvic floor and breathing mechanics with 10# squat with appropriate synergistic patterns to decrease pain and leakage at least 75% of the time for improved strength at pelvic floor .    Baseline:  Goal status: INITIAL  5.  Pt will have 75% reduced leakage during a typical day  Baseline:  Goal status: INITIAL   PLAN:  PT FREQUENCY: 1x/week  PT DURATION:  8 sessions  PLANNED INTERVENTIONS: Therapeutic exercises, Therapeutic activity, Neuromuscular re-education, Patient/Family education, Self Care, Joint mobilization, Aquatic Therapy, Dry Needling, Electrical stimulation, Cryotherapy, Moist heat, scar mobilization, Taping, Biofeedback, and Manual  therapy  PLAN FOR NEXT SESSION: internal assessment/treatment, pelvic floor strengthening, coordination of pelvic floor with breathing and exercises, lifting mechanics, hip and core strengthening, urge drill review, bladder retraining, stretching hips  Otelia Sergeant, PT, DPT 06/10/243:53 PM

## 2023-04-16 NOTE — Telephone Encounter (Signed)
Needs PA for Rybelsus 3mg  please. I don't have access to [employer]

## 2023-04-16 NOTE — Patient Instructions (Signed)
Bladder Irritants  Certain foods and beverages can be irritating to the bladder.  Avoiding these irritants may decrease your symptoms of urinary urgency, frequency or bladder pain.  Even reducing your intake can help with your symptoms.  Not everyone is sensitive to all bladder irritants, so you may consider focusing on one irritant at a time, removing or reducing your intake of that irritant for 7-10 days to see if this change helps your symptoms.  Water intake is also very important.  Below is a list of bladder irritants.  Drinks: alcohol, carbonated beverages, caffeinated beverages such as coffee and tea, drinks with artificial sweeteners, citrus juices, apple juice, tomato juice  Foods: tomatoes and tomato based foods, spicy food, sugar and artificial sweeteners, vinegar, chocolate, raw onion, apples, citrus fruits, pineapple, cranberries, tomatoes, strawberries, plums, peaches, cantaloupe  Other: acidic urine (too concentrated) - see water intake info below  Substitutes you can try that are NOT irritating to the bladder: cooked onion, pears, papayas, sun-brewed decaf teas, watermelons, non-citrus herbal teas, apricots, kava and low-acid instant drinks (Postum).    WATER INTAKE: Remember to drink lots of water (aim for fluid intake of half your body weight with 2/3 of fluids being water).  You may be limiting fluids due to fear of leakage, but this can actually worsen urgency symptoms due to highly concentrated urine.  Water helps balance the pH of your urine so it doesn't become too acidic - acidic urine is a bladder irritant!   Urge Incontinence  Ideal urination frequency is every 2-4 wakeful hours, which equates to 5-8 times within a 24-hour period.   Urge incontinence is leakage that occurs when the bladder muscle contracts, creating a sudden need to go before getting to the bathroom.   Going too often when your bladder isn't actually full can disrupt the body's automatic signals to  store and hold urine longer, which will increase urgency/frequency.  In this case, the bladder "is running the show" and strategies can be learned to retrain this pattern.   One should be able to control the first urge to urinate, at around 150mL.  The bladder can hold up to a "grande latte," or 400mL. To help you gain control, practice the Urge Drill below when urgency strikes.  This drill will help retrain your bladder signals and allow you to store and hold urine longer.  The overall goal is to stretch out your time between voids to reach a more manageable voiding schedule.    Practice your "quick flicks" often throughout the day (each waking hour) even when you don't need feel the urge to go.  This will help strengthen your pelvic floor muscles, making them more effective in controlling leakage.  Urge Drill  When you feel an urge to go, follow these steps to regain control: Stop what you are doing and be still Take one deep breath, directing your air into your abdomen Think an affirming thought, such as "I've got this." Do 5 quick flicks of your pelvic floor Walk with control to the bathroom to void, or delay voiding    

## 2023-04-17 ENCOUNTER — Other Ambulatory Visit (HOSPITAL_COMMUNITY): Payer: Self-pay

## 2023-04-18 ENCOUNTER — Other Ambulatory Visit (HOSPITAL_COMMUNITY): Payer: Self-pay

## 2023-04-18 NOTE — Telephone Encounter (Signed)
Per test claim, no PA is needed for Rybelsus at this time, also pt has [employer], no Blue-E is needed. This price is calculated under a  Facility and may change depending on dispensing pharmacy, [facility] automatically applied a copay card, but pt's charge should be $35.00 without copay card.

## 2023-04-18 NOTE — Telephone Encounter (Signed)
Noted thank you

## 2023-04-26 ENCOUNTER — Encounter: Payer: Self-pay | Admitting: Family Medicine

## 2023-04-27 ENCOUNTER — Other Ambulatory Visit: Payer: Self-pay | Admitting: Family Medicine

## 2023-04-27 DIAGNOSIS — R32 Unspecified urinary incontinence: Secondary | ICD-10-CM

## 2023-04-27 DIAGNOSIS — E1165 Type 2 diabetes mellitus with hyperglycemia: Secondary | ICD-10-CM

## 2023-04-27 MED ORDER — HYDROCODONE-ACETAMINOPHEN 5-325 MG PO TABS: 1.0000 | ORAL_TABLET | Freq: Four times a day (QID) | ORAL | 0 refills | Status: DC | PRN

## 2023-04-27 MED ORDER — RYBELSUS 7 MG PO TABS
7.0000 mg | ORAL_TABLET | Freq: Every day | ORAL | 1 refills | Status: DC
Start: 2023-04-27 — End: 2023-07-20

## 2023-04-27 MED ORDER — RYBELSUS 3 MG PO TABS: 3.0000 mg | ORAL_TABLET | Freq: Every day | ORAL | 0 refills | Status: DC

## 2023-05-04 ENCOUNTER — Other Ambulatory Visit (HOSPITAL_COMMUNITY): Payer: Self-pay

## 2023-05-07 ENCOUNTER — Encounter: Payer: Self-pay | Admitting: Family Medicine

## 2023-05-17 ENCOUNTER — Encounter: Payer: Self-pay | Admitting: Family Medicine

## 2023-05-18 MED ORDER — HYDROCODONE-ACETAMINOPHEN 5-325 MG PO TABS: 1.0000 | ORAL_TABLET | Freq: Four times a day (QID) | ORAL | 0 refills | Status: DC | PRN

## 2023-05-21 ENCOUNTER — Ambulatory Visit: Payer: Federal, State, Local not specified - PPO | Admitting: Family Medicine

## 2023-05-21 ENCOUNTER — Encounter: Payer: Self-pay | Admitting: Family Medicine

## 2023-05-21 VITALS — BP 120/80 | HR 76 | Temp 97.9°F | Resp 18 | Ht 62.0 in | Wt 211.2 lb

## 2023-05-21 DIAGNOSIS — K0889 Other specified disorders of teeth and supporting structures: Secondary | ICD-10-CM | POA: Diagnosis not present

## 2023-05-21 MED ORDER — CEFTRIAXONE SODIUM 1 G IJ SOLR
1.0000 g | Freq: Once | INTRAMUSCULAR | Status: AC
Start: 2023-05-21 — End: 2023-05-21
  Administered 2023-05-21: 1 g via INTRAMUSCULAR

## 2023-05-21 MED ORDER — TRAMADOL HCL 50 MG PO TABS
50.0000 mg | ORAL_TABLET | Freq: Three times a day (TID) | ORAL | 0 refills | Status: AC | PRN
Start: 2023-05-21 — End: 2023-05-26

## 2023-05-21 MED ORDER — KETOROLAC TROMETHAMINE 60 MG/2ML IM SOLN
60.0000 mg | Freq: Once | INTRAMUSCULAR | Status: AC
Start: 2023-05-21 — End: 2023-05-21
  Administered 2023-05-21: 60 mg via INTRAMUSCULAR

## 2023-05-21 MED ORDER — CEPHALEXIN 500 MG PO CAPS
500.0000 mg | ORAL_CAPSULE | Freq: Two times a day (BID) | ORAL | 0 refills | Status: DC
Start: 2023-05-21 — End: 2023-11-22

## 2023-05-21 NOTE — Progress Notes (Signed)
Established Patient Office Visit  Subjective   Patient ID: Rhonda Morrow, female    DOB: 1961-10-07  Age: 62 y.o. MRN: [id number]  Chief Complaint  Patient presents with  . Jaw Pain    Left jaw pain, x1 month, pain on and off, worsen last week. Going to the dentist tomorrow    HPI Discussed the use of AI scribe software for clinical note transcription with the patient, who gave verbal consent to proceed.  History of Present Illness   The patient presents with left-sided jaw pain that started last week. She initially thought it was due to a broken or cracked tooth, and also noticed a lump in their neck. The pain was severe enough to cause her to miss work on Thursday and Friday. She also experienced a headache. She had a dental appointment scheduled for last Wednesday, but it was cancelled due to the lack of an interpreter. She has another dental appointment scheduled for tomorrow.   interpreter is present as well.      Patient Active Problem List   Diagnosis Date Noted  . Urinary incontinence 01/31/2023  . Vaginal itching 01/31/2023  . Influenza 11/20/2022  . Postmenopausal bleeding 08/18/2022  . Acute non-recurrent pansinusitis 06/18/2022  . Leg cramps 03/08/2022  . Muscle spasm of right lower extremity 03/08/2022  . Primary osteoarthritis of right hip 03/01/2022  . ACE inhibitor intolerance 01/19/2022  . Weakness 12/22/2021  . Left lower quadrant abdominal pain 12/22/2021  . Slow transit constipation 12/22/2021  . Class 3 severe obesity due to excess calories without serious comorbidity with body mass index (BMI) of 40.0 to 44.9 in adult (HCC) 07/05/2021  . Restless leg syndrome 07/05/2021  . Chronic seasonal allergic rhinitis due to pollen 07/05/2021  . Psychophysiological insomnia 07/05/2021  . Non-compliance 07/05/2021  . COVID-19 07/05/2021  . Right wrist pain 06/05/2021  . Acute pain of left shoulder 01/24/2021  . Acute pain of right shoulder 01/24/2021  . Foot pain,  bilateral 01/24/2021  . Dyspepsia 01/24/2021  . Preventative health care 01/24/2021  . Right hand pain 12/29/2020  . OSA (obstructive sleep apnea) 09/12/2020  . Nocturia more than twice per night 08/03/2020  . Non-restorative sleep 08/03/2020  . Leg cramping 08/03/2020  . Hyperlipidemia associated with type 2 diabetes mellitus (HCC) 07/25/2020  . Large breasts 07/22/2020  . RLS (restless legs syndrome) 05/17/2020  . Cough 04/01/2020  . Asthma, moderate persistent, poorly-controlled 04/01/2020  . Medication management 04/01/2020  . Menopausal symptoms 03/29/2020  . Hot flashes 03/29/2020  . Chronic right shoulder pain 08/11/2019  . Acute non-recurrent maxillary sinusitis 08/11/2019  . BMI 40.0-44.9, adult (HCC) 01/22/2019  . Right lower quadrant abdominal pain 10/20/2018  . Rectal pain 10/20/2018  . Vitamin B 12 deficiency 03/28/2018  . B12 deficiency 03/28/2018  . DM (diabetes mellitus) type II uncontrolled, periph vascular disorder 03/28/2018  . Hyperlipidemia 03/28/2018  . Primary osteoarthritis of left hip 02/21/2017  . 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 History of laparoscopic cholecystectomy 06/20/2016  . Cerumen impaction 06/09/2016  . Hypersomnia 05/16/2016  . Knee pain, right 05/05/2016  . RUQ pain 04/09/2016  . Abnormal CT scan 03/28/2016  . Dyspnea and respiratory abnormalities 03/15/2016  . Asthma with acute exacerbation 03/15/2016  . Asthma in adult 02/25/2016  . DM (diabetes mellitus) type II controlled, neurological manifestation (HCC) 02/09/2016  . Left hip pain 12/23/2015  . Right hamstring muscle strain 11/18/2015  . Abdominal pain, acute 09/01/2015  . Acute  asthma exacerbation 04/16/2015  . Acute bronchitis 04/14/2015  . Pap smear for cervical cancer screening 09/14/2014  . Bed bug bite 05/06/2014  . Allergic rhinitis 04/09/2014  . Rectal itching 04/09/2014  . Bladder spasm 04/09/2014  . Knee pain, left 03/05/2014  . Hypokalemia  02/19/2014  . Nausea with vomiting 02/19/2014  . Diabetes mellitus, type II (HCC) 02/19/2014  . Edema 02/16/2014  . Disorder of rotator cuff 11/12/2013  . Routine general medical examination at a health care facility 08/20/2013  . Gastroesophageal reflux disease 06/26/2013  . Dysphagia, pharyngoesophageal phase 06/26/2013  . Medication side effect 03/25/2013  . Diarrhea 03/25/2013  . Benign positional vertigo 01/30/2013  . Traumatic hematoma of thigh 01/15/2013  . HTN (hypertension) 09/02/2012  . Dry skin 08/22/2012  . External hemorrhoids 08/22/2012  . Obesity 06/30/2012  . Thyromegaly 05/22/2012  . Back pain 05/22/2012  . Elevated glucose 05/22/2012  . Moderate persistent asthma 04/19/2012  . Dyspnea 01/28/2012   Past Medical History:  Diagnosis Date  . Arthritis   . Asthma   . Complication of anesthesia    one time woke up and was vey scared,17 yrs ago  . Deaf   . Diabetes mellitus without complication (HCC)   . GERD (gastroesophageal reflux disease)   . Hypertension   . Left groin pain   . Nausea with vomiting 02/19/2014  . Thyroid disease    Past Surgical History:  Procedure Laterality Date  . CARDIAC CATHETERIZATION    . CESAREAN SECTION    . CHOLECYSTECTOMY N/A 06/20/2016   Procedure: LAPAROSCOPIC CHOLECYSTECTOMY;  Surgeon: Emelia Loron, MD;  Location: [facility] OR;  Service: General;  Laterality: N/A;  . ESOPHAGEAL MANOMETRY N/A 09/29/2013   Procedure: ESOPHAGEAL MANOMETRY (EM);  Surgeon: Rachael Fee, MD;  Location: [facility] ENDOSCOPY;  Service: Endoscopy;  Laterality: N/A;  . TOTAL HIP ARTHROPLASTY Left 02/21/2017   Procedure: LEFT TOTAL HIP ARTHROPLASTY ANTERIOR APPROACH;  Surgeon: Tarry Kos, MD;  Location: [facility] OR;  Service: Orthopedics;  Laterality: Left;   Social History   Tobacco Use  . Smoking status: Never  . Smokeless tobacco: Never  Vaping Use  . Vaping status: Never Used  Substance Use Topics  . Alcohol use: No    Alcohol/week: 0.0 standard drinks  of alcohol  . Drug use: No   Social History   Socioeconomic History  . Marital status: Married    Spouse name: Not on file  . Number of children: Not on file  . Years of education: Not on file  . Highest education level: Not on file  Occupational History  . Occupation: disabled  Tobacco Use  . Smoking status: Never  . Smokeless tobacco: Never  Vaping Use  . Vaping status: Never Used  Substance and Sexual Activity  . Alcohol use: No    Alcohol/week: 0.0 standard drinks of alcohol  . Drug use: No  . Sexual activity: Not Currently  Other Topics Concern  . Not on file  Social History Narrative   Lives with family.  Has 3 children.  Works for [employer]  Education: high school.   Social Determinants of Health   Financial Resource Strain: Not on file  Food Insecurity: Not on file  Transportation Needs: Not on file  Physical Activity: Not on file  Stress: Not on file  Social Connections: Unknown (03/18/2022)   Received from [facility]   Social Network   . Social Network: Not on file  Intimate Partner Violence: Unknown (02/08/2022)   Received from  [facility]   HITS   . Physically Hurt: Not on file   . Insult or Talk Down To: Not on file   . Threaten Physical Harm: Not on file   . Scream or Curse: Not on file   Family Status  Relation Name Status  . Mother  (Not Specified)  . Father  (Not Specified)  No partnership data on file   Family History  Problem Relation Age of Onset  . Diabetes Mother   . Heart disease Mother   . Diabetes Father    Allergies  Allergen Reactions  . Losartan Shortness Of Breath  . Oxycodone-Acetaminophen Nausea And Vomiting  . Augmentin [Amoxicillin-Pot Clavulanate] Diarrhea      Review of Systems  Constitutional:  Negative for fever and malaise/fatigue.  HENT:  Negative for congestion.   Eyes:  Negative for blurred vision.  Respiratory:  Negative for cough and shortness of breath.   Cardiovascular:  Negative for chest pain,  palpitations and leg swelling.  Gastrointestinal:  Negative for vomiting.  Musculoskeletal:  Negative for back pain.  Skin:  Negative for rash.  Neurological:  Negative for loss of consciousness and headaches.      Objective:     BP 120/80 (BP Location: Left Arm, Patient Position: Sitting, Cuff Size: Normal)   Pulse 76   Temp 97.9 F (36.6 C) (Oral)   Resp 18   Ht 5\' 2"  (1.575 m)   Wt 211 lb 3.2 oz (95.8 kg)   LMP 05/20/2012   SpO2 98%   BMI 38.63 kg/m  BP Readings from Last 3 Encounters:  05/21/23 120/80  04/09/23 (!) 146/67  04/03/23 108/82   Wt Readings from Last 3 Encounters:  05/21/23 211 lb 3.2 oz (95.8 kg)  04/09/23 215 lb (97.5 kg)  04/03/23 213 lb 9.6 oz (96.9 kg)   SpO2 Readings from Last 3 Encounters:  05/21/23 98%  04/03/23 98%  03/20/23 99%      Physical Exam Vitals and nursing note reviewed.  HENT:     Head:     Jaw: Tenderness and swelling present.     Mouth/Throat:     Dentition: Abnormal dentition. Dental tenderness present.     Comments: Looks like tooth low left side may be broken/ cracked Tender to touch    No results found for any visits on 05/21/23.  Last CBC Lab Results  Component Value Date   WBC 6.7 12/14/2022   HGB 14.0 12/14/2022   HCT 42.7 12/14/2022   MCV 84.2 12/14/2022   MCH 27.2 08/18/2022   RDW 15.4 12/14/2022   PLT 129.0 (L) 12/14/2022   Last metabolic panel Lab Results  Component Value Date   GLUCOSE 82 12/14/2022   NA 144 12/14/2022   K 3.5 12/14/2022   CL 100 12/14/2022   CO2 30 12/14/2022   BUN 15 12/14/2022   CREATININE 1.01 12/14/2022   GFR 61.04 12/14/2022   CALCIUM 9.3 12/14/2022   PHOS 3.0 03/07/2022   PROT 6.5 12/14/2022   ALBUMIN 3.8 12/14/2022   BILITOT 0.6 12/14/2022   ALKPHOS 63 12/14/2022   AST 23 12/14/2022   ALT 21 12/14/2022   ANIONGAP 13 05/24/2020   Last lipids Lab Results  Component Value Date   CHOL 176 12/05/2021   HDL 55.10 12/05/2021   LDLCALC 104 (H) 12/05/2021   TRIG  85.0 12/05/2021   CHOLHDL 3 12/05/2021   Last hemoglobin A1c Lab Results  Component Value Date   HGBA1C 6.9 (H) 12/14/2022  Last thyroid functions Lab Results  Component Value Date   TSH 1.23 08/18/2022   T4TOTAL 10.7 12/22/2021   Last vitamin D Lab Results  Component Value Date   VD25OH 52.50 02/08/2016   Last vitamin B12 and Folate Lab Results  Component Value Date   VITAMINB12 868 12/22/2021   FOLATE 19.2 05/14/2019      The 10-year ASCVD risk score (Arnett DK, et al., 2019) is: 10.9%    Assessment & Plan:   Problem List Items Addressed This Visit   None Visit Diagnoses     Tooth pain    -  Primary   Relevant Medications   cephALEXin (KEFLEX) 500 MG capsule   traMADol (ULTRAM) 50 MG tablet   cefTRIAXone (ROCEPHIN) injection 1 g (Start on 05/21/2023  4:15 PM)   ketorolac (TORADOL) injection 60 mg (Start on 05/21/2023  4:15 PM)     Assessment and Plan    Dental Pain and Possible Infection: Severe left-sided jaw pain with a palpable lump in the neck. Patient has a dental appointment scheduled for tomorrow. -Administer Rocephin and Toradol injections today for immediate relief. -Prescribe Keflex and Tramadol for pickup at [facility] [address], to be started after dental evaluation if deemed necessary by the dentist. -Provide a doctor's note for work absence on the previous Thursday and Friday due to pain.  Groin Pain: Chronic issue, no new changes or acute exacerbations discussed in this visit. -No changes to current management plan.  General Health Maintenance / Followup Plans -Follow up with dentist tomorrow for further evaluation and management of dental issue. -Return to work on Wednesday if pain is manageable.        No follow-ups on file.    Donato Schultz, DO

## 2023-05-22 DIAGNOSIS — K080 Exfoliation of teeth due to systemic causes: Secondary | ICD-10-CM | POA: Diagnosis not present

## 2023-05-30 ENCOUNTER — Other Ambulatory Visit: Payer: Self-pay

## 2023-05-30 ENCOUNTER — Telehealth: Payer: Self-pay | Admitting: Family Medicine

## 2023-05-30 NOTE — Telephone Encounter (Signed)
Pt called. LVM to call back.

## 2023-05-30 NOTE — Telephone Encounter (Signed)
Pt called to get clarification on how she is supposed to take her rybelsus and metformin. Please advise

## 2023-06-06 ENCOUNTER — Telehealth: Payer: Self-pay | Admitting: Family Medicine

## 2023-06-06 DIAGNOSIS — K080 Exfoliation of teeth due to systemic causes: Secondary | ICD-10-CM | POA: Diagnosis not present

## 2023-06-06 NOTE — Telephone Encounter (Signed)
Rhonda Morrow from [facility] called to advise that the dentist took a panoramic xray and noticed some plaque on her carotid artery. They cannot fax it due to the images will not be clear. She said she could probably email it better and requested an email address. Please call her #[phone] or email them at [email or web address] providing them with the email address to forward the photos to.

## 2023-06-07 NOTE — Telephone Encounter (Signed)
FYI - waiting on fax

## 2023-06-07 NOTE — Telephone Encounter (Signed)
Janelle called back for a status update on how to get the images to our office. Per discussion with Herbert Seta, advised her to have the result notes faxed over to [place] for review and we will order additional imaging if needed. She acknowledged understanding and stated they would fax over the notes shortly.

## 2023-06-11 ENCOUNTER — Ambulatory Visit: Payer: Federal, State, Local not specified - PPO | Attending: Obstetrics and Gynecology | Admitting: Physical Therapy

## 2023-06-11 DIAGNOSIS — M25551 Pain in right hip: Secondary | ICD-10-CM | POA: Insufficient documentation

## 2023-06-11 DIAGNOSIS — M6281 Muscle weakness (generalized): Secondary | ICD-10-CM | POA: Insufficient documentation

## 2023-06-11 DIAGNOSIS — R269 Unspecified abnormalities of gait and mobility: Secondary | ICD-10-CM | POA: Insufficient documentation

## 2023-06-11 DIAGNOSIS — R279 Unspecified lack of coordination: Secondary | ICD-10-CM | POA: Insufficient documentation

## 2023-06-11 DIAGNOSIS — R293 Abnormal posture: Secondary | ICD-10-CM | POA: Diagnosis not present

## 2023-06-11 NOTE — Patient Instructions (Signed)

## 2023-06-11 NOTE — Therapy (Signed)
OUTPATIENT PHYSICAL THERAPY FEMALE PELVIC TREATMENT   Patient Name: Rhonda Morrow MRN: [id number] DOB:[birth year], 62 y.o., female Today's Date: 06/11/2023  END OF SESSION:  PT End of Session - 06/11/23 1534     Visit Number 2    Date for PT Re-Evaluation 07/17/23    Authorization Type [employer]    PT Start Time 1531    PT Stop Time 1613    PT Time Calculation (min) 42 min    Activity Tolerance Patient tolerated treatment well    Behavior During Therapy WFL for tasks assessed/performed             Past Medical History:  Diagnosis Date   Arthritis    Asthma    Complication of anesthesia    one time woke up and was vey scared,17 yrs ago   Deaf    Diabetes mellitus without complication (HCC)    GERD (gastroesophageal reflux disease)    Hypertension    Left groin pain    Nausea with vomiting 02/19/2014   Thyroid disease    Past Surgical History:  Procedure Laterality Date   CARDIAC CATHETERIZATION     CESAREAN SECTION     CHOLECYSTECTOMY N/A 06/20/2016   Procedure: LAPAROSCOPIC CHOLECYSTECTOMY;  Surgeon: Emelia Loron, MD;  Location: [facility] OR;  Service: General;  Laterality: N/A;   ESOPHAGEAL MANOMETRY N/A 09/29/2013   Procedure: ESOPHAGEAL MANOMETRY (EM);  Surgeon: Rachael Fee, MD;  Location: [facility];  Service: Endoscopy;  Laterality: N/A;   TOTAL HIP ARTHROPLASTY Left 02/21/2017   Procedure: LEFT TOTAL HIP ARTHROPLASTY ANTERIOR APPROACH;  Surgeon: Tarry Kos, MD;  Location: [facility] OR;  Service: Orthopedics;  Laterality: Left;   Patient Active Problem List   Diagnosis Date Noted   Urinary incontinence 01/31/2023   Vaginal itching 01/31/2023   Influenza 11/20/2022   Postmenopausal bleeding 08/18/2022   Acute non-recurrent pansinusitis 06/18/2022   Leg cramps 03/08/2022   Muscle spasm of right lower extremity 03/08/2022   Primary osteoarthritis of right hip 03/01/2022   ACE inhibitor intolerance 01/19/2022   Weakness 12/22/2021   Left lower quadrant abdominal  pain 12/22/2021   Slow transit constipation 12/22/2021   Class 3 severe obesity due to excess calories without serious comorbidity with body mass index (BMI) of 40.0 to 44.9 in adult [facility]) 07/05/2021   Restless leg syndrome 07/05/2021   Chronic seasonal allergic rhinitis due to pollen 07/05/2021   Psychophysiological insomnia 07/05/2021   Non-compliance 07/05/2021   COVID-19 07/05/2021   Right wrist pain 06/05/2021   Acute pain of left shoulder 01/24/2021   Acute pain of right shoulder 01/24/2021   Foot pain, bilateral 01/24/2021   Dyspepsia 01/24/2021   Preventative health care 01/24/2021   Right hand pain 12/29/2020   OSA (obstructive sleep apnea) 09/12/2020   Nocturia more than twice per night 08/03/2020   Non-restorative sleep 08/03/2020   Leg cramping 08/03/2020   Hyperlipidemia associated with type 2 diabetes mellitus (HCC) 07/25/2020   Large breasts 07/22/2020   RLS (restless legs syndrome) 05/17/2020   Cough 04/01/2020   Asthma, moderate persistent, poorly-controlled 04/01/2020   Medication management 04/01/2020   Menopausal symptoms 03/29/2020   Hot flashes 03/29/2020   Chronic right shoulder pain 08/11/2019   Acute non-recurrent maxillary sinusitis 08/11/2019   BMI 40.0-44.9, adult (HCC) 01/22/2019   Right lower quadrant abdominal pain 10/20/2018   Rectal pain 10/20/2018   Vitamin B 12 deficiency 03/28/2018   B12 deficiency 03/28/2018   DM (diabetes mellitus) type II uncontrolled, periph vascular  disorder 03/28/2018   Hyperlipidemia 03/28/2018   Primary osteoarthritis of left hip 02/21/2017   Status post total hip replacement, left 02/21/2017   History of laparoscopic cholecystectomy 06/20/2016   Cerumen impaction 06/09/2016   Hypersomnia 05/16/2016   Knee pain, right 05/05/2016   RUQ pain 04/09/2016   Abnormal CT scan 03/28/2016   Dyspnea and respiratory abnormalities 03/15/2016   Asthma with acute exacerbation 03/15/2016   Asthma in adult 02/25/2016   DM  (diabetes mellitus) type II controlled, neurological manifestation (HCC) 02/09/2016   Left hip pain 12/23/2015   Right hamstring muscle strain 11/18/2015   Abdominal pain, acute 09/01/2015   Acute asthma exacerbation 04/16/2015   Acute bronchitis 04/14/2015   Pap smear for cervical cancer screening 09/14/2014   Bed bug bite 05/06/2014   Allergic rhinitis 04/09/2014   Rectal itching 04/09/2014   Bladder spasm 04/09/2014   Knee pain, left 03/05/2014   Hypokalemia 02/19/2014   Nausea with vomiting 02/19/2014   Diabetes mellitus, type II (HCC) 02/19/2014   Edema 02/16/2014   Disorder of rotator cuff 11/12/2013   Routine general medical examination at a health care facility 08/20/2013   Gastroesophageal reflux disease 06/26/2013   Dysphagia, pharyngoesophageal phase 06/26/2013   Medication side effect 03/25/2013   Diarrhea 03/25/2013   Benign positional vertigo 01/30/2013   Traumatic hematoma of thigh 01/15/2013   HTN (hypertension) 09/02/2012   Dry skin 08/22/2012   External hemorrhoids 08/22/2012   Obesity 06/30/2012   Thyromegaly 05/22/2012   Back pain 05/22/2012   Elevated glucose 05/22/2012   Moderate persistent asthma 04/19/2012   Dyspnea 01/28/2012    PCP: Donato Schultz, DO  REFERRING PROVIDER: Lorriane Shire, MD    REFERRING DIAG: N39.46 (ICD-10-CM) - Mixed stress and urge urinary incontinence  THERAPY DIAG:  Muscle weakness (generalized)  Unspecified lack of coordination  Abnormality of gait and mobility  Right hip pain  Rationale for Evaluation and Treatment: Rehabilitation  ONSET DATE: several months  SUBJECTIVE:                                                                                                                                                                                           SUBJECTIVE STATEMENT: Sign language interpreter present throughout session for all communication.   Sometimes waits too long with taking care of  husband and mother and forgets to go or take medicine but has been helping. Has been having less leakage/urgency at night but still has happened.   Does take medicine for Rt groin pain and this is better. .  Fluid intake: Yes: water - at least 2 large bottles at work  PAIN:  Are you having pain? Yes - sometimes has pain at Rt groin with prolonged walking and with pelvic exam. Sometimes with walking the pelvic area hurts more as well.   PRECAUTIONS: Other: hearing impaired   WEIGHT BEARING RESTRICTIONS: No  FALLS:  Has patient fallen in last 6 months? No  LIVING ENVIRONMENT: Lives with: lives with their family Lives in: House/apartment   OCCUPATION: [employer]   PLOF: Independent  PATIENT GOALS: to have less leakage  PERTINENT HISTORY:  Arthritis, deaf, DM, GERD, HTN, thyroid disease, THA Lt, C-section  Sexual abuse: No  BOWEL MOVEMENT: Pain with bowel movement: No Type of bowel movement:Type (Bristol Stool Scale) 4-5, Frequency daily, and Strain No Fully empty rectum: No Leakage: No Pads: No Fiber supplement: No  URINATION: Pain with urination: No Fully empty bladder: Yes:   Stream: Weak Urgency: Yes: worse at night and drinks a lot of water during the night.  Frequency: 1-2x at night; unsure how often during the day Leakage:  sometimes at night worse, unable to make it to the bathroom with urge;  Pads: No  INTERCOURSE: Pain with intercourse:  not active Ability to have vaginal penetration:  Yes:   Climax: not painful Marinoff Scale: 0/3  PREGNANCY: Vaginal deliveries 2 Tearing No C-section deliveries 1 Currently pregnant No  PROLAPSE: None   OBJECTIVE:   DIAGNOSTIC FINDINGS:    COGNITION: Overall cognitive status: Within functional limits for tasks assessed     SENSATION: Light touch: Appears intact Proprioception: Appears intact  MUSCLE LENGTH: Bil hamstrings and adductors limited by 50%   POSTURE: rounded shoulders,  forward head, and anterior pelvic tilt  PELVIC ALIGNMENT: Rt anterior rotation   LUMBARAROM/PROM:  A/PROM A/PROM  eval  Flexion Limited by 50%  Extension WFL  Right lateral flexion Limited by 25%  Left lateral flexion Limited by 25%  Right rotation Limited by 50%  Left rotation Limited by 50%   (Blank rows = not tested)  LOWER EXTREMITY ROM:  WFL however tightness at Rt adduction, ER and IR, and flexion but functional   LOWER EXTREMITY MMT:  Bil hip abduction 3+/5, all other hip 4-/5 and Rt side with Rt groin pain  PALPATION:   General  tightness at lumbar spine, bil gluteals, Rt ITB, hip complex. TTP Rt groin                External Perineal Exam                             Internal Pelvic Floor   Patient confirms identification and approves PT to assess internal pelvic floor and treatment yes 06/11/23   PELVIC MMT:   MMT 06/11/23   Vaginal 1/5, 1s, 2 reps  Internal Anal Sphincter   External Anal Sphincter   Puborectalis   Diastasis Recti   (Blank rows = not tested)        TONE: Decreased  PROLAPSE: Not seen in hooklying  TODAY'S TREATMENT:  DATE:   04/16/23 EVAL Examination completed, findings reviewed, pt educated on POC, HEP, and bladder irritants and urge drill handouts and reviewed. Pt motivated to participate in PT and agreeable to attempt recommendations.    06/11/23: Patient consented to internal pelvic floor assessment vaginally this date and found to have decreased strength, endurance, and coordination. Patient benefited from verbal cues for improved technique with pelvic floor contractions and breathing mechanics.  Pt demonstrated great difficulty gaining pelvic floor contraction and needed max cues and extra time and verbal cues for ability to do this. Initially demonstrated pushing downward however with cues stopped this and  corrected.  Reviewed urge drill as pt reports she lost this from eval  X10 sitting pelvic floor contractions with towel roll and without (each) and pt then able to feel contraction better in sitting per pt.    PATIENT EDUCATION:  Education details:  HEP, and bladder irritants and urge drill  Person educated: Patient Education method: Explanation, Demonstration, Tactile cues, Verbal cues, and Handouts Education comprehension: verbalized understanding, returned demonstration, verbal cues required, tactile cues required, and needs further education  HOME EXERCISE PROGRAM: [id number]  ASSESSMENT:  CLINICAL IMPRESSION: Patient presents for treatment, focus on assessing pelvic floor and updating HEP. Pt required max verbal cues and extra time to achieve pelvic floor contraction. Pt was able to complete 1/5 strength in hooklying with these cues and time. However once dressed pt reports she was much more able to feel contraction in sitting with towel roll, then without too. Pt given HEP and urge drill as she reports she wasn't able to find these at home. All educated on during session. Interpreter used throughout. Pt would benefit from additional PT to further address deficits.     OBJECTIVE IMPAIRMENTS: decreased activity tolerance, decreased coordination, decreased endurance, difficulty walking, decreased strength, increased fascial restrictions, increased muscle spasms, impaired flexibility, improper body mechanics, postural dysfunction, obesity, and pain.   ACTIVITY LIMITATIONS: carrying, lifting, squatting, continence, and locomotion level  PARTICIPATION LIMITATIONS: community activity  PERSONAL FACTORS: Time since onset of injury/illness/exacerbation and 1 comorbidity: medical history  are also affecting patient's functional outcome.   REHAB POTENTIAL: Good  CLINICAL DECISION MAKING: Stable/uncomplicated  EVALUATION COMPLEXITY: Low   GOALS: Goals reviewed with patient? Yes  SHORT  TERM GOALS: Target date: 05/14/23  Pt to be I with HEP.  Baseline: Goal status: INITIAL  2.  Pt to report improved time between bladder voids to at least 4 hours at night for improved QOL with decreased urinary frequency.   Baseline:  Goal status: INITIAL  3.  Pt will have 25% less urgency due to bladder retraining and strengthening  Baseline:  Goal status: INITIAL  4.  Pt to be I with urge drill for decreased  leakage instances.  Baseline:  Goal status: INITIAL   LONG TERM GOALS: Target date: 07/17/23  Pt to be I with advanced HEP.  Baseline:  Goal status: INITIAL  2.  Pt will have 50% less urgency due to bladder retraining and strengthening  Baseline:  Goal status: INITIAL  3. Pt to demonstrate at least 3/5 pelvic floor strength for improved pelvic stability and decreased strain at pelvic floor/ decrease leakage.  Baseline:  Goal status: INITIAL  4.  Pt to demonstrate improved coordination of pelvic floor and breathing mechanics with 10# squat with appropriate synergistic patterns to decrease pain and leakage at least 75% of the time for improved strength at pelvic floor .    Baseline:  Goal status: INITIAL  5.  Pt will have 75% reduced leakage during a typical day  Baseline:  Goal status: INITIAL   PLAN:  PT FREQUENCY: 1x/week  PT DURATION:  8 sessions  PLANNED INTERVENTIONS: Therapeutic exercises, Therapeutic activity, Neuromuscular re-education, Patient/Family education, Self Care, Joint mobilization, Aquatic Therapy, Dry Needling, Electrical stimulation, Cryotherapy, Moist heat, scar mobilization, Taping, Biofeedback, and Manual therapy  PLAN FOR NEXT SESSION: internal assessment/treatment, pelvic floor strengthening, coordination of pelvic floor with breathing and exercises, lifting mechanics, hip and core strengthening, urge drill review, bladder retraining, stretching hips  Otelia Sergeant, PT, DPT 08/05/245:03 PM

## 2023-06-14 ENCOUNTER — Encounter: Payer: Self-pay | Admitting: Pulmonary Disease

## 2023-06-14 ENCOUNTER — Other Ambulatory Visit: Payer: Self-pay | Admitting: Nurse Practitioner

## 2023-06-14 DIAGNOSIS — J4540 Moderate persistent asthma, uncomplicated: Secondary | ICD-10-CM

## 2023-06-18 ENCOUNTER — Ambulatory Visit: Payer: Federal, State, Local not specified - PPO | Admitting: Physical Therapy

## 2023-06-18 DIAGNOSIS — M25551 Pain in right hip: Secondary | ICD-10-CM | POA: Diagnosis not present

## 2023-06-18 DIAGNOSIS — R279 Unspecified lack of coordination: Secondary | ICD-10-CM | POA: Diagnosis not present

## 2023-06-18 DIAGNOSIS — M6281 Muscle weakness (generalized): Secondary | ICD-10-CM | POA: Diagnosis not present

## 2023-06-18 DIAGNOSIS — R269 Unspecified abnormalities of gait and mobility: Secondary | ICD-10-CM | POA: Diagnosis not present

## 2023-06-18 DIAGNOSIS — R293 Abnormal posture: Secondary | ICD-10-CM | POA: Diagnosis not present

## 2023-06-18 NOTE — Therapy (Signed)
OUTPATIENT PHYSICAL THERAPY FEMALE PELVIC TREATMENT   Patient Name: Rhonda Morrow MRN: [id number] DOB:Dec 19, 1960, 62 y.o., female Today's Date: 06/18/2023  END OF SESSION:  PT End of Session - 06/18/23 1538     Visit Number 3    Date for PT Re-Evaluation 07/17/23    Authorization Type [employer]    PT Start Time 1536    PT Stop Time 1615    PT Time Calculation (min) 39 min    Activity Tolerance Patient tolerated treatment well    Behavior During Therapy WFL for tasks assessed/performed             Past Medical History:  Diagnosis Date   Arthritis    Asthma    Complication of anesthesia    one time woke up and was vey scared,17 yrs ago   Deaf    Diabetes mellitus without complication (HCC)    GERD (gastroesophageal reflux disease)    Hypertension    Left groin pain    Nausea with vomiting 02/19/2014   Thyroid disease    Past Surgical History:  Procedure Laterality Date   CARDIAC CATHETERIZATION     CESAREAN SECTION     CHOLECYSTECTOMY N/A 06/20/2016   Procedure: LAPAROSCOPIC CHOLECYSTECTOMY;  Surgeon: Emelia Loron, MD;  Location: [facility];  Service: General;  Laterality: N/A;   ESOPHAGEAL MANOMETRY N/A 09/29/2013   Procedure: ESOPHAGEAL MANOMETRY (EM);  Surgeon: Rachael Fee, MD;  Location: [facility];  Service: Endoscopy;  Laterality: N/A;   TOTAL HIP ARTHROPLASTY Left 02/21/2017   Procedure: LEFT TOTAL HIP ARTHROPLASTY ANTERIOR APPROACH;  Surgeon: Tarry Kos, MD;  Location: [facility] OR;  Service: Orthopedics;  Laterality: Left;   Patient Active Problem List   Diagnosis Date Noted   Urinary incontinence 01/31/2023   Vaginal itching 01/31/2023   Influenza 11/20/2022   Postmenopausal bleeding 08/18/2022   Acute non-recurrent pansinusitis 06/18/2022   Leg cramps 03/08/2022   Muscle spasm of right lower extremity 03/08/2022   Primary osteoarthritis of right hip 03/01/2022   ACE inhibitor intolerance 01/19/2022   Weakness 12/22/2021   Left lower quadrant abdominal  pain 12/22/2021   Slow transit constipation 12/22/2021   Class 3 severe obesity due to excess calories without serious comorbidity with body mass index (BMI) of 40.0 to 44.9 in adult [facility]) 07/05/2021   Restless leg syndrome 07/05/2021   Chronic seasonal allergic rhinitis due to pollen 07/05/2021   Psychophysiological insomnia 07/05/2021   Non-compliance 07/05/2021   COVID-19 07/05/2021   Right wrist pain 06/05/2021   Acute pain of left shoulder 01/24/2021   Acute pain of right shoulder 01/24/2021   Foot pain, bilateral 01/24/2021   Dyspepsia 01/24/2021   Preventative health care 01/24/2021   Right hand pain 12/29/2020   OSA (obstructive sleep apnea) 09/12/2020   Nocturia more than twice per night 08/03/2020   Non-restorative sleep 08/03/2020   Leg cramping 08/03/2020   Hyperlipidemia associated with type 2 diabetes mellitus (HCC) 07/25/2020   Large breasts 07/22/2020   RLS (restless legs syndrome) 05/17/2020   Cough 04/01/2020   Asthma, moderate persistent, poorly-controlled 04/01/2020   Medication management 04/01/2020   Menopausal symptoms 03/29/2020   Hot flashes 03/29/2020   Chronic right shoulder pain 08/11/2019   Acute non-recurrent maxillary sinusitis 08/11/2019   BMI 40.0-44.9, adult (HCC) 01/22/2019   Right lower quadrant abdominal pain 10/20/2018   Rectal pain 10/20/2018   Vitamin B 12 deficiency 03/28/2018   B12 deficiency 03/28/2018   DM (diabetes mellitus) type II uncontrolled, periph vascular  disorder 03/28/2018   Hyperlipidemia 03/28/2018   Primary osteoarthritis of left hip 02/21/2017   Status post total hip replacement, left 02/21/2017   History of laparoscopic cholecystectomy 06/20/2016   Cerumen impaction 06/09/2016   Hypersomnia 05/16/2016   Knee pain, right 05/05/2016   RUQ pain 04/09/2016   Abnormal CT scan 03/28/2016   Dyspnea and respiratory abnormalities 03/15/2016   Asthma with acute exacerbation 03/15/2016   Asthma in adult 02/25/2016   DM  (diabetes mellitus) type II controlled, neurological manifestation (HCC) 02/09/2016   Left hip pain 12/23/2015   Right hamstring muscle strain 11/18/2015   Abdominal pain, acute 09/01/2015   Acute asthma exacerbation 04/16/2015   Acute bronchitis 04/14/2015   Pap smear for cervical cancer screening 09/14/2014   Bed bug bite 05/06/2014   Allergic rhinitis 04/09/2014   Rectal itching 04/09/2014   Bladder spasm 04/09/2014   Knee pain, left 03/05/2014   Hypokalemia 02/19/2014   Nausea with vomiting 02/19/2014   Diabetes mellitus, type II (HCC) 02/19/2014   Edema 02/16/2014   Disorder of rotator cuff 11/12/2013   Routine general medical examination at a health care facility 08/20/2013   Gastroesophageal reflux disease 06/26/2013   Dysphagia, pharyngoesophageal phase 06/26/2013   Medication side effect 03/25/2013   Diarrhea 03/25/2013   Benign positional vertigo 01/30/2013   Traumatic hematoma of thigh 01/15/2013   HTN (hypertension) 09/02/2012   Dry skin 08/22/2012   External hemorrhoids 08/22/2012   Obesity 06/30/2012   Thyromegaly 05/22/2012   Back pain 05/22/2012   Elevated glucose 05/22/2012   Moderate persistent asthma 04/19/2012   Dyspnea 01/28/2012    PCP: Donato Schultz, DO  REFERRING PROVIDER: Lorriane Shire, MD    REFERRING DIAG: N39.46 (ICD-10-CM) - Mixed stress and urge urinary incontinence  THERAPY DIAG:  Muscle weakness (generalized)  Unspecified lack of coordination  Right hip pain  Rationale for Evaluation and Treatment: Rehabilitation  ONSET DATE: several months  SUBJECTIVE:                                                                                                                                                                                           SUBJECTIVE STATEMENT: Sign language interpreter present throughout session for all communication.   Rt hip was hurting a lot with rain last week. Now doing better with pain. Has not  consistently been able to do urge drill but has tried and has helped some. Is having some itching at vulva today.  Feels like she is able to make it to the bathroom more without leakage, and when having leakage is smaller amounts.   Fluid intake: Yes: water -  at least 2 large bottles at work    PAIN:  Are you having pain? No 06/18/23   PRECAUTIONS: Other: hearing impaired   WEIGHT BEARING RESTRICTIONS: No  FALLS:  Has patient fallen in last 6 months? No  LIVING ENVIRONMENT: Lives with: lives with their family Lives in: House/apartment   OCCUPATION: [employer]   PLOF: Independent  PATIENT GOALS: to have less leakage  PERTINENT HISTORY:  Arthritis, deaf, DM, GERD, HTN, thyroid disease, THA Lt, C-section  Sexual abuse: No  BOWEL MOVEMENT: Pain with bowel movement: No Type of bowel movement:Type (Bristol Stool Scale) 4-5, Frequency daily, and Strain No Fully empty rectum: No Leakage: No Pads: No Fiber supplement: No  URINATION: Pain with urination: No Fully empty bladder: Yes:   Stream: Weak Urgency: Yes: worse at night and drinks a lot of water during the night.  Frequency: 1-2x at night; unsure how often during the day Leakage:  sometimes at night worse, unable to make it to the bathroom with urge;  Pads: No  INTERCOURSE: Pain with intercourse:  not active Ability to have vaginal penetration:  Yes:   Climax: not painful Marinoff Scale: 0/3  PREGNANCY: Vaginal deliveries 2 Tearing No C-section deliveries 1 Currently pregnant No  PROLAPSE: None   OBJECTIVE:   DIAGNOSTIC FINDINGS:    COGNITION: Overall cognitive status: Within functional limits for tasks assessed     SENSATION: Light touch: Appears intact Proprioception: Appears intact  MUSCLE LENGTH: Bil hamstrings and adductors limited by 50%   POSTURE: rounded shoulders, forward head, and anterior pelvic tilt  PELVIC ALIGNMENT: Rt anterior rotation   LUMBARAROM/PROM:  A/PROM  A/PROM  eval  Flexion Limited by 50%  Extension WFL  Right lateral flexion Limited by 25%  Left lateral flexion Limited by 25%  Right rotation Limited by 50%  Left rotation Limited by 50%   (Blank rows = not tested)  LOWER EXTREMITY ROM:  WFL however tightness at Rt adduction, ER and IR, and flexion but functional   LOWER EXTREMITY MMT:  Bil hip abduction 3+/5, all other hip 4-/5 and Rt side with Rt groin pain  PALPATION:   General  tightness at lumbar spine, bil gluteals, Rt ITB, hip complex. TTP Rt groin                External Perineal Exam                             Internal Pelvic Floor   Patient confirms identification and approves PT to assess internal pelvic floor and treatment yes 06/11/23   PELVIC MMT:   MMT 06/11/23   Vaginal 1/5, 1s, 2 reps  Internal Anal Sphincter   External Anal Sphincter   Puborectalis   Diastasis Recti   (Blank rows = not tested)        TONE: Decreased  PROLAPSE: Not seen in hooklying  TODAY'S TREATMENT:  DATE:   06/18/23: NMRE: all exercises cued for breathing mechanics and pelvic floor contraction with coordination to improve pelvic floor strength and coordination for decreased leakage Opp hand/knee ball press 2x10 each Bridges 2x10 Hip abduction 2x10 2x10 hand ball squeezes with exhale and pelvic floor contraction to improve coordination  X10 Sit to stand with pelvic floor contraction   PATIENT EDUCATION:  Education details:  HEP, and bladder irritants and urge drill  Person educated: Patient Education method: Explanation, Demonstration, Tactile cues, Verbal cues, and Handouts Education comprehension: verbalized understanding, returned demonstration, verbal cues required, tactile cues required, and needs further education  HOME EXERCISE PROGRAM: [id number]  ASSESSMENT:  CLINICAL  IMPRESSION: Patient presents for treatment, focus on coordinating pelvic floor with activity. Pt required verbal cues visual demonstration for improved technique and coordination but able to complete. Pt reports ability to feel contraction of pelvic floor with extra time and understanding of coordination of technique with breathing mechanics. Interpreter used throughout. Pt would benefit from additional PT to further address deficits.     OBJECTIVE IMPAIRMENTS: decreased activity tolerance, decreased coordination, decreased endurance, difficulty walking, decreased strength, increased fascial restrictions, increased muscle spasms, impaired flexibility, improper body mechanics, postural dysfunction, obesity, and pain.   ACTIVITY LIMITATIONS: carrying, lifting, squatting, continence, and locomotion level  PARTICIPATION LIMITATIONS: community activity  PERSONAL FACTORS: Time since onset of injury/illness/exacerbation and 1 comorbidity: medical history  are also affecting patient's functional outcome.   REHAB POTENTIAL: Good  CLINICAL DECISION MAKING: Stable/uncomplicated  EVALUATION COMPLEXITY: Low   GOALS: Goals reviewed with patient? Yes  SHORT TERM GOALS: Target date: 05/14/23  Pt to be I with HEP.  Baseline: Goal status: INITIAL  2.  Pt to report improved time between bladder voids to at least 4 hours at night for improved QOL with decreased urinary frequency.   Baseline:  Goal status: INITIAL  3.  Pt will have 25% less urgency due to bladder retraining and strengthening  Baseline:  Goal status: INITIAL  4.  Pt to be I with urge drill for decreased  leakage instances.  Baseline:  Goal status: INITIAL   LONG TERM GOALS: Target date: 07/17/23  Pt to be I with advanced HEP.  Baseline:  Goal status: INITIAL  2.  Pt will have 50% less urgency due to bladder retraining and strengthening  Baseline:  Goal status: INITIAL  3. Pt to demonstrate at least 3/5 pelvic floor strength  for improved pelvic stability and decreased strain at pelvic floor/ decrease leakage.  Baseline:  Goal status: INITIAL  4.  Pt to demonstrate improved coordination of pelvic floor and breathing mechanics with 10# squat with appropriate synergistic patterns to decrease pain and leakage at least 75% of the time for improved strength at pelvic floor .    Baseline:  Goal status: INITIAL  5.  Pt will have 75% reduced leakage during a typical day  Baseline:  Goal status: INITIAL   PLAN:  PT FREQUENCY: 1x/week  PT DURATION:  8 sessions  PLANNED INTERVENTIONS: Therapeutic exercises, Therapeutic activity, Neuromuscular re-education, Patient/Family education, Self Care, Joint mobilization, Aquatic Therapy, Dry Needling, Electrical stimulation, Cryotherapy, Moist heat, scar mobilization, Taping, Biofeedback, and Manual therapy  PLAN FOR NEXT SESSION: internal assessment/treatment, pelvic floor strengthening, coordination of pelvic floor with breathing and exercises, lifting mechanics, hip and core strengthening, urge drill review, bladder retraining, stretching hips  Otelia Sergeant, PT, DPT 08/12/244:17 PM

## 2023-06-25 ENCOUNTER — Ambulatory Visit: Payer: Federal, State, Local not specified - PPO | Admitting: Physical Therapy

## 2023-06-25 DIAGNOSIS — M25551 Pain in right hip: Secondary | ICD-10-CM | POA: Diagnosis not present

## 2023-06-25 DIAGNOSIS — R279 Unspecified lack of coordination: Secondary | ICD-10-CM

## 2023-06-25 DIAGNOSIS — R293 Abnormal posture: Secondary | ICD-10-CM | POA: Diagnosis not present

## 2023-06-25 DIAGNOSIS — M6281 Muscle weakness (generalized): Secondary | ICD-10-CM | POA: Diagnosis not present

## 2023-06-25 DIAGNOSIS — R269 Unspecified abnormalities of gait and mobility: Secondary | ICD-10-CM | POA: Diagnosis not present

## 2023-06-25 NOTE — Therapy (Signed)
OUTPATIENT PHYSICAL THERAPY FEMALE PELVIC TREATMENT   Patient Name: Rhonda Morrow MRN: [id number] DOB:08/07/1961, 62 y.o., female Today's Date: 06/25/2023  END OF SESSION:  PT End of Session - 06/25/23 1538     Visit Number 4    Date for PT Re-Evaluation 07/17/23    Authorization Type [employer]    PT Start Time 1530    PT Stop Time 1610    PT Time Calculation (min) 40 min    Activity Tolerance Patient tolerated treatment well    Behavior During Therapy WFL for tasks assessed/performed              Past Medical History:  Diagnosis Date   Arthritis    Asthma    Complication of anesthesia    one time woke up and was vey scared,17 yrs ago   Deaf    Diabetes mellitus without complication (HCC)    GERD (gastroesophageal reflux disease)    Hypertension    Left groin pain    Nausea with vomiting 02/19/2014   Thyroid disease    Past Surgical History:  Procedure Laterality Date   CARDIAC CATHETERIZATION     CESAREAN SECTION     CHOLECYSTECTOMY N/A 06/20/2016   Procedure: LAPAROSCOPIC CHOLECYSTECTOMY;  Surgeon: Emelia Loron, MD;  Location: [facility] OR;  Service: General;  Laterality: N/A;   ESOPHAGEAL MANOMETRY N/A 09/29/2013   Procedure: ESOPHAGEAL MANOMETRY (EM);  Surgeon: Rachael Fee, MD;  Location: [facility];  Service: Endoscopy;  Laterality: N/A;   TOTAL HIP ARTHROPLASTY Left 02/21/2017   Procedure: LEFT TOTAL HIP ARTHROPLASTY ANTERIOR APPROACH;  Surgeon: Tarry Kos, MD;  Location: [facility] OR;  Service: Orthopedics;  Laterality: Left;   Patient Active Problem List   Diagnosis Date Noted   Urinary incontinence 01/31/2023   Vaginal itching 01/31/2023   Influenza 11/20/2022   Postmenopausal bleeding 08/18/2022   Acute non-recurrent pansinusitis 06/18/2022   Leg cramps 03/08/2022   Muscle spasm of right lower extremity 03/08/2022   Primary osteoarthritis of right hip 03/01/2022   ACE inhibitor intolerance 01/19/2022   Weakness 12/22/2021   Left lower quadrant  abdominal pain 12/22/2021   Slow transit constipation 12/22/2021   Class 3 severe obesity due to excess calories without serious comorbidity with body mass index (BMI) of 40.0 to 44.9 in adult [facility]) 07/05/2021   Restless leg syndrome 07/05/2021   Chronic seasonal allergic rhinitis due to pollen 07/05/2021   Psychophysiological insomnia 07/05/2021   Non-compliance 07/05/2021   COVID-19 07/05/2021   Right wrist pain 06/05/2021   Acute pain of left shoulder 01/24/2021   Acute pain of right shoulder 01/24/2021   Foot pain, bilateral 01/24/2021   Dyspepsia 01/24/2021   Preventative health care 01/24/2021   Right hand pain 12/29/2020   OSA (obstructive sleep apnea) 09/12/2020   Nocturia more than twice per night 08/03/2020   Non-restorative sleep 08/03/2020   Leg cramping 08/03/2020   Hyperlipidemia associated with type 2 diabetes mellitus (HCC) 07/25/2020   Large breasts 07/22/2020   RLS (restless legs syndrome) 05/17/2020   Cough 04/01/2020   Asthma, moderate persistent, poorly-controlled 04/01/2020   Medication management 04/01/2020   Menopausal symptoms 03/29/2020   Hot flashes 03/29/2020   Chronic right shoulder pain 08/11/2019   Acute non-recurrent maxillary sinusitis 08/11/2019   BMI 40.0-44.9, adult (HCC) 01/22/2019   Right lower quadrant abdominal pain 10/20/2018   Rectal pain 10/20/2018   Vitamin B 12 deficiency 03/28/2018   B12 deficiency 03/28/2018   DM (diabetes mellitus) type II uncontrolled, periph  vascular disorder 03/28/2018   Hyperlipidemia 03/28/2018   Primary osteoarthritis of left hip 02/21/2017   Status post total hip replacement, left 02/21/2017   History of laparoscopic cholecystectomy 06/20/2016   Cerumen impaction 06/09/2016   Hypersomnia 05/16/2016   Knee pain, right 05/05/2016   RUQ pain 04/09/2016   Abnormal CT scan 03/28/2016   Dyspnea and respiratory abnormalities 03/15/2016   Asthma with acute exacerbation 03/15/2016   Asthma in adult 02/25/2016    DM (diabetes mellitus) type II controlled, neurological manifestation (HCC) 02/09/2016   Left hip pain 12/23/2015   Right hamstring muscle strain 11/18/2015   Abdominal pain, acute 09/01/2015   Acute asthma exacerbation 04/16/2015   Acute bronchitis 04/14/2015   Pap smear for cervical cancer screening 09/14/2014   Bed bug bite 05/06/2014   Allergic rhinitis 04/09/2014   Rectal itching 04/09/2014   Bladder spasm 04/09/2014   Knee pain, left 03/05/2014   Hypokalemia 02/19/2014   Nausea with vomiting 02/19/2014   Diabetes mellitus, type II (HCC) 02/19/2014   Edema 02/16/2014   Disorder of rotator cuff 11/12/2013   Routine general medical examination at a health care facility 08/20/2013   Gastroesophageal reflux disease 06/26/2013   Dysphagia, pharyngoesophageal phase 06/26/2013   Medication side effect 03/25/2013   Diarrhea 03/25/2013   Benign positional vertigo 01/30/2013   Traumatic hematoma of thigh 01/15/2013   HTN (hypertension) 09/02/2012   Dry skin 08/22/2012   External hemorrhoids 08/22/2012   Obesity 06/30/2012   Thyromegaly 05/22/2012   Back pain 05/22/2012   Elevated glucose 05/22/2012   Moderate persistent asthma 04/19/2012   Dyspnea 01/28/2012    PCP: Donato Schultz, DO  REFERRING PROVIDER: Lorriane Shire, MD    REFERRING DIAG: N39.46 (ICD-10-CM) - Mixed stress and urge urinary incontinence  THERAPY DIAG:  Muscle weakness (generalized)  Unspecified lack of coordination  Abnormal posture  Rationale for Evaluation and Treatment: Rehabilitation  ONSET DATE: several months  SUBJECTIVE:                                                                                                                                                                                           SUBJECTIVE STATEMENT: Sign language interpreter present throughout session for all communication.   Rt hip still hurting but not as bad. Urinary leakage has been better. Urge  drill has been very helpful at work with very small amount of leakage but able to make it to the bathroom.   Fluid intake: Yes: water - at least 2 large bottles at work    PAIN:  Are you having pain? No 06/18/23   PRECAUTIONS: Other: hearing impaired  WEIGHT BEARING RESTRICTIONS: No  FALLS:  Has patient fallen in last 6 months? No  LIVING ENVIRONMENT: Lives with: lives with their family Lives in: House/apartment   OCCUPATION: [employer]   PLOF: Independent  PATIENT GOALS: to have less leakage  PERTINENT HISTORY:  Arthritis, deaf, DM, GERD, HTN, thyroid disease, THA Lt, C-section  Sexual abuse: No  BOWEL MOVEMENT: Pain with bowel movement: No Type of bowel movement:Type (Bristol Stool Scale) 4-5, Frequency daily, and Strain No Fully empty rectum: No Leakage: No Pads: No Fiber supplement: No  URINATION: Pain with urination: No Fully empty bladder: Yes:   Stream: Weak Urgency: Yes: worse at night and drinks a lot of water during the night.  Frequency: 1-2x at night; unsure how often during the day Leakage:  sometimes at night worse, unable to make it to the bathroom with urge;  Pads: No  INTERCOURSE: Pain with intercourse:  not active Ability to have vaginal penetration:  Yes:   Climax: not painful Marinoff Scale: 0/3  PREGNANCY: Vaginal deliveries 2 Tearing No C-section deliveries 1 Currently pregnant No  PROLAPSE: None   OBJECTIVE:   DIAGNOSTIC FINDINGS:    COGNITION: Overall cognitive status: Within functional limits for tasks assessed     SENSATION: Light touch: Appears intact Proprioception: Appears intact  MUSCLE LENGTH: Bil hamstrings and adductors limited by 50%   POSTURE: rounded shoulders, forward head, and anterior pelvic tilt  PELVIC ALIGNMENT: Rt anterior rotation   LUMBARAROM/PROM:  A/PROM A/PROM  eval  Flexion Limited by 50%  Extension WFL  Right lateral flexion Limited by 25%  Left lateral flexion Limited  by 25%  Right rotation Limited by 50%  Left rotation Limited by 50%   (Blank rows = not tested)  LOWER EXTREMITY ROM:  WFL however tightness at Rt adduction, ER and IR, and flexion but functional   LOWER EXTREMITY MMT:  Bil hip abduction 3+/5, all other hip 4-/5 and Rt side with Rt groin pain  PALPATION:   General  tightness at lumbar spine, bil gluteals, Rt ITB, hip complex. TTP Rt groin                External Perineal Exam                             Internal Pelvic Floor   Patient confirms identification and approves PT to assess internal pelvic floor and treatment yes 06/11/23   PELVIC MMT:   MMT 06/11/23   Vaginal 1/5, 1s, 2 reps  Internal Anal Sphincter   External Anal Sphincter   Puborectalis   Diastasis Recti   (Blank rows = not tested)        TONE: Decreased  PROLAPSE: Not seen in hooklying  TODAY'S TREATMENT:  DATE:   06/25/23  Therapeutic exercise:  Bil piriformis stretch 2x30s Hamstring stretch 2x30s NMRE: all exercises cued for breathing mechanics and pelvic floor contraction with coordination to improve pelvic floor strength and coordination for decreased leakage Opp hand/knee ball press 2x10 each Bridges 2x10 Hip abduction 2x10 red loop  Hip flexion 2x10 red loop X10 Sit to stand with pelvic floor contraction   PATIENT EDUCATION:  Education details:  HEP, and bladder irritants and urge drill  Person educated: Patient Education method: Explanation, Demonstration, Tactile cues, Verbal cues, and Handouts Education comprehension: verbalized understanding, returned demonstration, verbal cues required, tactile cues required, and needs further education  HOME EXERCISE PROGRAM: [id number]  ASSESSMENT:  CLINICAL IMPRESSION: Patient presents for treatment, focus on coordinating pelvic floor with activity. Pt required verbal cues  visual demonstration for improved technique and coordination but able to complete. Pt reports improved ability to feel contractions this past week and has seen improvement with leakage overall.  Interpreter used throughout. Pt would benefit from additional PT to further address deficits.     OBJECTIVE IMPAIRMENTS: decreased activity tolerance, decreased coordination, decreased endurance, difficulty walking, decreased strength, increased fascial restrictions, increased muscle spasms, impaired flexibility, improper body mechanics, postural dysfunction, obesity, and pain.   ACTIVITY LIMITATIONS: carrying, lifting, squatting, continence, and locomotion level  PARTICIPATION LIMITATIONS: community activity  PERSONAL FACTORS: Time since onset of injury/illness/exacerbation and 1 comorbidity: medical history  are also affecting patient's functional outcome.   REHAB POTENTIAL: Good  CLINICAL DECISION MAKING: Stable/uncomplicated  EVALUATION COMPLEXITY: Low   GOALS: Goals reviewed with patient? Yes  SHORT TERM GOALS: Target date: 05/14/23  Pt to be I with HEP.  Baseline: Goal status: INITIAL  2.  Pt to report improved time between bladder voids to at least 4 hours at night for improved QOL with decreased urinary frequency.   Baseline:  Goal status: INITIAL  3.  Pt will have 25% less urgency due to bladder retraining and strengthening  Baseline:  Goal status: INITIAL  4.  Pt to be I with urge drill for decreased  leakage instances.  Baseline:  Goal status: INITIAL   LONG TERM GOALS: Target date: 07/17/23  Pt to be I with advanced HEP.  Baseline:  Goal status: INITIAL  2.  Pt will have 50% less urgency due to bladder retraining and strengthening  Baseline:  Goal status: INITIAL  3. Pt to demonstrate at least 3/5 pelvic floor strength for improved pelvic stability and decreased strain at pelvic floor/ decrease leakage.  Baseline:  Goal status: INITIAL  4.  Pt to demonstrate  improved coordination of pelvic floor and breathing mechanics with 10# squat with appropriate synergistic patterns to decrease pain and leakage at least 75% of the time for improved strength at pelvic floor .    Baseline:  Goal status: INITIAL  5.  Pt will have 75% reduced leakage during a typical day  Baseline:  Goal status: INITIAL   PLAN:  PT FREQUENCY: 1x/week  PT DURATION:  8 sessions  PLANNED INTERVENTIONS: Therapeutic exercises, Therapeutic activity, Neuromuscular re-education, Patient/Family education, Self Care, Joint mobilization, Aquatic Therapy, Dry Needling, Electrical stimulation, Cryotherapy, Moist heat, scar mobilization, Taping, Biofeedback, and Manual therapy  PLAN FOR NEXT SESSION: internal assessment/treatment, pelvic floor strengthening, coordination of pelvic floor with breathing and exercises, lifting mechanics, hip and core strengthening, urge drill review, bladder retraining, stretching hips  Otelia Sergeant, PT, DPT 08/19/244:14 PM

## 2023-07-02 ENCOUNTER — Ambulatory Visit: Payer: Federal, State, Local not specified - PPO | Admitting: Physical Therapy

## 2023-07-10 ENCOUNTER — Ambulatory Visit: Payer: Federal, State, Local not specified - PPO | Attending: Obstetrics and Gynecology | Admitting: Physical Therapy

## 2023-07-10 DIAGNOSIS — M25551 Pain in right hip: Secondary | ICD-10-CM | POA: Insufficient documentation

## 2023-07-10 DIAGNOSIS — R293 Abnormal posture: Secondary | ICD-10-CM | POA: Insufficient documentation

## 2023-07-10 DIAGNOSIS — M6281 Muscle weakness (generalized): Secondary | ICD-10-CM | POA: Insufficient documentation

## 2023-07-10 DIAGNOSIS — R279 Unspecified lack of coordination: Secondary | ICD-10-CM | POA: Diagnosis not present

## 2023-07-10 NOTE — Therapy (Signed)
OUTPATIENT PHYSICAL THERAPY FEMALE PELVIC TREATMENT   Patient Name: Rhonda Morrow MRN: [id number] DOB:11/17/1960, 62 y.o., female Today's Date: 07/10/2023  END OF SESSION:  PT End of Session - 07/10/23 1535     Visit Number 5    Date for PT Re-Evaluation 07/17/23    Authorization Type [employer]    PT Start Time 1531    PT Stop Time 1610    PT Time Calculation (min) 39 min    Activity Tolerance Patient tolerated treatment well    Behavior During Therapy WFL for tasks assessed/performed              Past Medical History:  Diagnosis Date   Arthritis    Asthma    Complication of anesthesia    one time woke up and was vey scared,17 yrs ago   Deaf    Diabetes mellitus without complication (HCC)    GERD (gastroesophageal reflux disease)    Hypertension    Left groin pain    Nausea with vomiting 02/19/2014   Thyroid disease    Past Surgical History:  Procedure Laterality Date   CARDIAC CATHETERIZATION     CESAREAN SECTION     CHOLECYSTECTOMY N/A 06/20/2016   Procedure: LAPAROSCOPIC CHOLECYSTECTOMY;  Surgeon: Emelia Loron, MD;  Location: [facility] OR;  Service: General;  Laterality: N/A;   ESOPHAGEAL MANOMETRY N/A 09/29/2013   Procedure: ESOPHAGEAL MANOMETRY (EM);  Surgeon: Rachael Fee, MD;  Location: [facility];  Service: Endoscopy;  Laterality: N/A;   TOTAL HIP ARTHROPLASTY Left 02/21/2017   Procedure: LEFT TOTAL HIP ARTHROPLASTY ANTERIOR APPROACH;  Surgeon: Tarry Kos, MD;  Location: [facility] OR;  Service: Orthopedics;  Laterality: Left;   Patient Active Problem List   Diagnosis Date Noted   Urinary incontinence 01/31/2023   Vaginal itching 01/31/2023   Influenza 11/20/2022   Postmenopausal bleeding 08/18/2022   Acute non-recurrent pansinusitis 06/18/2022   Leg cramps 03/08/2022   Muscle spasm of right lower extremity 03/08/2022   Primary osteoarthritis of right hip 03/01/2022   ACE inhibitor intolerance 01/19/2022   Weakness 12/22/2021   Left lower quadrant abdominal  pain 12/22/2021   Slow transit constipation 12/22/2021   Class 3 severe obesity due to excess calories without serious comorbidity with body mass index (BMI) of 40.0 to 44.9 in adult [facility]) 07/05/2021   Restless leg syndrome 07/05/2021   Chronic seasonal allergic rhinitis due to pollen 07/05/2021   Psychophysiological insomnia 07/05/2021   Non-compliance 07/05/2021   COVID-19 07/05/2021   Right wrist pain 06/05/2021   Acute pain of left shoulder 01/24/2021   Acute pain of right shoulder 01/24/2021   Foot pain, bilateral 01/24/2021   Dyspepsia 01/24/2021   Preventative health care 01/24/2021   Right hand pain 12/29/2020   OSA (obstructive sleep apnea) 09/12/2020   Nocturia more than twice per night 08/03/2020   Non-restorative sleep 08/03/2020   Leg cramping 08/03/2020   Hyperlipidemia associated with type 2 diabetes mellitus (HCC) 07/25/2020   Large breasts 07/22/2020   RLS (restless legs syndrome) 05/17/2020   Cough 04/01/2020   Asthma, moderate persistent, poorly-controlled 04/01/2020   Medication management 04/01/2020   Menopausal symptoms 03/29/2020   Hot flashes 03/29/2020   Chronic right shoulder pain 08/11/2019   Acute non-recurrent maxillary sinusitis 08/11/2019   BMI 40.0-44.9, adult (HCC) 01/22/2019   Right lower quadrant abdominal pain 10/20/2018   Rectal pain 10/20/2018   Vitamin B 12 deficiency 03/28/2018   B12 deficiency 03/28/2018   DM (diabetes mellitus) type II uncontrolled, periph  vascular disorder 03/28/2018   Hyperlipidemia 03/28/2018   Primary osteoarthritis of left hip 02/21/2017   Status post total hip replacement, left 02/21/2017   History of laparoscopic cholecystectomy 06/20/2016   Cerumen impaction 06/09/2016   Hypersomnia 05/16/2016   Knee pain, right 05/05/2016   RUQ pain 04/09/2016   Abnormal CT scan 03/28/2016   Dyspnea and respiratory abnormalities 03/15/2016   Asthma with acute exacerbation 03/15/2016   Asthma in adult 02/25/2016   DM  (diabetes mellitus) type II controlled, neurological manifestation (HCC) 02/09/2016   Left hip pain 12/23/2015   Right hamstring muscle strain 11/18/2015   Abdominal pain, acute 09/01/2015   Acute asthma exacerbation 04/16/2015   Acute bronchitis 04/14/2015   Pap smear for cervical cancer screening 09/14/2014   Bed bug bite 05/06/2014   Allergic rhinitis 04/09/2014   Rectal itching 04/09/2014   Bladder spasm 04/09/2014   Knee pain, left 03/05/2014   Hypokalemia 02/19/2014   Nausea with vomiting 02/19/2014   Diabetes mellitus, type II (HCC) 02/19/2014   Edema 02/16/2014   Disorder of rotator cuff 11/12/2013   Routine general medical examination at a health care facility 08/20/2013   Gastroesophageal reflux disease 06/26/2013   Dysphagia, pharyngoesophageal phase 06/26/2013   Medication side effect 03/25/2013   Diarrhea 03/25/2013   Benign positional vertigo 01/30/2013   Traumatic hematoma of thigh 01/15/2013   HTN (hypertension) 09/02/2012   Dry skin 08/22/2012   External hemorrhoids 08/22/2012   Obesity 06/30/2012   Thyromegaly 05/22/2012   Back pain 05/22/2012   Elevated glucose 05/22/2012   Moderate persistent asthma 04/19/2012   Dyspnea 01/28/2012    PCP: Donato Schultz, DO  REFERRING PROVIDER: Lorriane Shire, MD    REFERRING DIAG: N39.46 (ICD-10-CM) - Mixed stress and urge urinary incontinence  THERAPY DIAG:  Muscle weakness (generalized)  Unspecified lack of coordination  Abnormal posture  Right hip pain  Rationale for Evaluation and Treatment: Rehabilitation  ONSET DATE: several months  SUBJECTIVE:                                                                                                                                                                                           SUBJECTIVE STATEMENT: Sign language interpreter present throughout session for all communication.   Reports she has been waiting on medication for urine leakage  from [facility]. Has been without for 2 days, about the same with urine leakage except at night. Reports without medication has to get up 4 times and has leakage at night.   Fluid intake: Yes: water - at least 2 large bottles at work    PAIN:  Are you having  pain? No 06/18/23   PRECAUTIONS: Other: hearing impaired   WEIGHT BEARING RESTRICTIONS: No  FALLS:  Has patient fallen in last 6 months? No  LIVING ENVIRONMENT: Lives with: lives with their family Lives in: House/apartment   OCCUPATION: [employer]   PLOF: Independent  PATIENT GOALS: to have less leakage  PERTINENT HISTORY:  Arthritis, deaf, DM, GERD, HTN, thyroid disease, THA Lt, C-section  Sexual abuse: No  BOWEL MOVEMENT: Pain with bowel movement: No Type of bowel movement:Type (Bristol Stool Scale) 4-5, Frequency daily, and Strain No Fully empty rectum: No Leakage: No Pads: No Fiber supplement: No  URINATION: Pain with urination: No Fully empty bladder: Yes:   Stream: Weak Urgency: Yes: worse at night and drinks a lot of water during the night.  Frequency: 1-2x at night; unsure how often during the day Leakage:  sometimes at night worse, unable to make it to the bathroom with urge;  Pads: No  INTERCOURSE: Pain with intercourse:  not active Ability to have vaginal penetration:  Yes:   Climax: not painful Marinoff Scale: 0/3  PREGNANCY: Vaginal deliveries 2 Tearing No C-section deliveries 1 Currently pregnant No  PROLAPSE: None   OBJECTIVE:   DIAGNOSTIC FINDINGS:    COGNITION: Overall cognitive status: Within functional limits for tasks assessed     SENSATION: Light touch: Appears intact Proprioception: Appears intact  MUSCLE LENGTH: Bil hamstrings and adductors limited by 50%   POSTURE: rounded shoulders, forward head, and anterior pelvic tilt  PELVIC ALIGNMENT: Rt anterior rotation   LUMBARAROM/PROM:  A/PROM A/PROM  eval  Flexion Limited by 50%  Extension WFL  Right  lateral flexion Limited by 25%  Left lateral flexion Limited by 25%  Right rotation Limited by 50%  Left rotation Limited by 50%   (Blank rows = not tested)  LOWER EXTREMITY ROM:  WFL however tightness at Rt adduction, ER and IR, and flexion but functional   LOWER EXTREMITY MMT:  Bil hip abduction 3+/5, all other hip 4-/5 and Rt side with Rt groin pain  PALPATION:   General  tightness at lumbar spine, bil gluteals, Rt ITB, hip complex. TTP Rt groin Patient confirms identification and approves PT to assess internal pelvic floor and treatment yes 06/11/23   PELVIC MMT:   MMT 06/11/23   Vaginal 1/5, 1s, 2 reps  Internal Anal Sphincter   External Anal Sphincter   Puborectalis   Diastasis Recti   (Blank rows = not tested)        TONE: Decreased PROLAPSE: Not seen in hooklying  TODAY'S TREATMENT:                                                                                                                              DATE:   07/10/23  Reviewing urge drill and breathing mechanics for home and work for pt to have improved understanding on sequence.  External visual biofeedback used at low abdomen for improved understanding of contraction at  pelvic floor. Pt required max cues but was able to complete small pelvic floor contractions with cues and use of ball to coordination squeezing and contraction. 4x10 completed with intermittently no mobility noted but only 4 reps.  Pt reports she will continue HEP and urge drill.    PATIENT EDUCATION:  Education details:  HEP, and bladder irritants and urge drill  Person educated: Patient Education method: Explanation, Demonstration, Tactile cues, Verbal cues, and Handouts Education comprehension: verbalized understanding, returned demonstration, verbal cues required, tactile cues required, and needs further education  HOME EXERCISE PROGRAM: [id number]  ASSESSMENT:  CLINICAL IMPRESSION: Patient presents for treatment, reviewing POC,  goals and visual feedback for pelvic floor contractions in hooklying. Pt tolerated well, reports she feels contraction but does get confused on coordination of breathing with this. States she is very busy at home and work and sometimes forgets to do HEP or urge drill consistently. Reports with medication and therapy has had 40% improvement in symptoms. Able to sleep more at night only getting up 1x usually, voiding more fully during the day and with less leakage overall. Pt would benefit from additional PT to further address deficits.     OBJECTIVE IMPAIRMENTS: decreased activity tolerance, decreased coordination, decreased endurance, difficulty walking, decreased strength, increased fascial restrictions, increased muscle spasms, impaired flexibility, improper body mechanics, postural dysfunction, obesity, and pain.   ACTIVITY LIMITATIONS: carrying, lifting, squatting, continence, and locomotion level  PARTICIPATION LIMITATIONS: community activity  PERSONAL FACTORS: Time since onset of injury/illness/exacerbation and 1 comorbidity: medical history  are also affecting patient's functional outcome.   REHAB POTENTIAL: Good  CLINICAL DECISION MAKING: Stable/uncomplicated  EVALUATION COMPLEXITY: Low   GOALS: Goals reviewed with patient? Yes  SHORT TERM GOALS: Target date: 05/14/23  Pt to be I with HEP.  Baseline: Goal status: MET  2.  Pt to report improved time between bladder voids to at least 4 hours at night for improved QOL with decreased urinary frequency.   Baseline:  Goal status: MET  3.  Pt will have 25% less urgency due to bladder retraining and strengthening  Baseline:  Goal status: MET  4.  Pt to be I with urge drill for decreased  leakage instances.  Baseline:  Goal status: MET   LONG TERM GOALS: Target date: 07/17/23  Pt to be I with advanced HEP.  Baseline:  Goal status: on going  2.  Pt will have 50% less urgency due to bladder retraining and strengthening   Baseline:  Goal status: on going  3. Pt to demonstrate at least 3/5 pelvic floor strength for improved pelvic stability and decreased strain at pelvic floor/ decrease leakage.  Baseline:  Goal status: not reassessed yet  4.  Pt to demonstrate improved coordination of pelvic floor and breathing mechanics with 10# squat with appropriate synergistic patterns to decrease pain and leakage at least 75% of the time for improved strength at pelvic floor .    Baseline:  Goal status: on going  5.  Pt will have 75% reduced leakage during a typical day  Baseline:  Goal status: on going   PLAN:  PT FREQUENCY: 1x/week  PT DURATION:  8 sessions  PLANNED INTERVENTIONS: Therapeutic exercises, Therapeutic activity, Neuromuscular re-education, Patient/Family education, Self Care, Joint mobilization, Aquatic Therapy, Dry Needling, Electrical stimulation, Cryotherapy, Moist heat, scar mobilization, Taping, Biofeedback, and Manual therapy  PLAN FOR NEXT SESSION: internal assessment/treatment, pelvic floor strengthening, coordination of pelvic floor with breathing and exercises, lifting mechanics, hip and core strengthening, urge drill review, bladder  retraining, stretching hips  Otelia Sergeant, PT, DPT 09/03/244:19 PM

## 2023-07-16 ENCOUNTER — Ambulatory Visit: Payer: Federal, State, Local not specified - PPO | Admitting: Physical Therapy

## 2023-07-16 DIAGNOSIS — M6281 Muscle weakness (generalized): Secondary | ICD-10-CM

## 2023-07-16 DIAGNOSIS — R279 Unspecified lack of coordination: Secondary | ICD-10-CM

## 2023-07-16 DIAGNOSIS — M25551 Pain in right hip: Secondary | ICD-10-CM | POA: Diagnosis not present

## 2023-07-16 DIAGNOSIS — R293 Abnormal posture: Secondary | ICD-10-CM

## 2023-07-16 NOTE — Therapy (Signed)
OUTPATIENT PHYSICAL THERAPY FEMALE PELVIC TREATMENT   Patient Name: Rhonda Morrow MRN: [id number] DOB:Sep 01, 1961, 62 y.o., female Today's Date: 07/16/2023  END OF SESSION:  PT End of Session - 07/16/23 1531     Visit Number 6    Date for PT Re-Evaluation 07/17/23    Authorization Type [employer]    PT Start Time 1530    PT Stop Time 1610    PT Time Calculation (min) 40 min    Activity Tolerance Patient tolerated treatment well    Behavior During Therapy WFL for tasks assessed/performed               Past Medical History:  Diagnosis Date   Arthritis    Asthma    Complication of anesthesia    one time woke up and was vey scared,17 yrs ago   Deaf    Diabetes mellitus without complication (HCC)    GERD (gastroesophageal reflux disease)    Hypertension    Left groin pain    Nausea with vomiting 02/19/2014   Thyroid disease    Past Surgical History:  Procedure Laterality Date   CARDIAC CATHETERIZATION     CESAREAN SECTION     CHOLECYSTECTOMY N/A 06/20/2016   Procedure: LAPAROSCOPIC CHOLECYSTECTOMY;  Surgeon: Emelia Loron, MD;  Location: [facility] OR;  Service: General;  Laterality: N/A;   ESOPHAGEAL MANOMETRY N/A 09/29/2013   Procedure: ESOPHAGEAL MANOMETRY (EM);  Surgeon: Rachael Fee, MD;  Location: [facility];  Service: Endoscopy;  Laterality: N/A;   TOTAL HIP ARTHROPLASTY Left 02/21/2017   Procedure: LEFT TOTAL HIP ARTHROPLASTY ANTERIOR APPROACH;  Surgeon: Tarry Kos, MD;  Location: [facility] OR;  Service: Orthopedics;  Laterality: Left;   Patient Active Problem List   Diagnosis Date Noted   Urinary incontinence 01/31/2023   Vaginal itching 01/31/2023   Influenza 11/20/2022   Postmenopausal bleeding 08/18/2022   Acute non-recurrent pansinusitis 06/18/2022   Leg cramps 03/08/2022   Muscle spasm of right lower extremity 03/08/2022   Primary osteoarthritis of right hip 03/01/2022   ACE inhibitor intolerance 01/19/2022   Weakness 12/22/2021   Left lower quadrant  abdominal pain 12/22/2021   Slow transit constipation 12/22/2021   Class 3 severe obesity due to excess calories without serious comorbidity with body mass index (BMI) of 40.0 to 44.9 in adult [facility]) 07/05/2021   Restless leg syndrome 07/05/2021   Chronic seasonal allergic rhinitis due to pollen 07/05/2021   Psychophysiological insomnia 07/05/2021   Non-compliance 07/05/2021   COVID-19 07/05/2021   Right wrist pain 06/05/2021   Acute pain of left shoulder 01/24/2021   Acute pain of right shoulder 01/24/2021   Foot pain, bilateral 01/24/2021   Dyspepsia 01/24/2021   Preventative health care 01/24/2021   Right hand pain 12/29/2020   OSA (obstructive sleep apnea) 09/12/2020   Nocturia more than twice per night 08/03/2020   Non-restorative sleep 08/03/2020   Leg cramping 08/03/2020   Hyperlipidemia associated with type 2 diabetes mellitus (HCC) 07/25/2020   Large breasts 07/22/2020   RLS (restless legs syndrome) 05/17/2020   Cough 04/01/2020   Asthma, moderate persistent, poorly-controlled 04/01/2020   Medication management 04/01/2020   Menopausal symptoms 03/29/2020   Hot flashes 03/29/2020   Chronic right shoulder pain 08/11/2019   Acute non-recurrent maxillary sinusitis 08/11/2019   BMI 40.0-44.9, adult (HCC) 01/22/2019   Right lower quadrant abdominal pain 10/20/2018   Rectal pain 10/20/2018   Vitamin B 12 deficiency 03/28/2018   B12 deficiency 03/28/2018   DM (diabetes mellitus) type II uncontrolled,  periph vascular disorder 03/28/2018   Hyperlipidemia 03/28/2018   Primary osteoarthritis of left hip 02/21/2017   Status post total hip replacement, left 02/21/2017   History of laparoscopic cholecystectomy 06/20/2016   Cerumen impaction 06/09/2016   Hypersomnia 05/16/2016   Knee pain, right 05/05/2016   RUQ pain 04/09/2016   Abnormal CT scan 03/28/2016   Dyspnea and respiratory abnormalities 03/15/2016   Asthma with acute exacerbation 03/15/2016   Asthma in adult 02/25/2016    DM (diabetes mellitus) type II controlled, neurological manifestation (HCC) 02/09/2016   Left hip pain 12/23/2015   Right hamstring muscle strain 11/18/2015   Abdominal pain, acute 09/01/2015   Acute asthma exacerbation 04/16/2015   Acute bronchitis 04/14/2015   Pap smear for cervical cancer screening 09/14/2014   Bed bug bite 05/06/2014   Allergic rhinitis 04/09/2014   Rectal itching 04/09/2014   Bladder spasm 04/09/2014   Knee pain, left 03/05/2014   Hypokalemia 02/19/2014   Nausea with vomiting 02/19/2014   Diabetes mellitus, type II (HCC) 02/19/2014   Edema 02/16/2014   Disorder of rotator cuff 11/12/2013   Routine general medical examination at a health care facility 08/20/2013   Gastroesophageal reflux disease 06/26/2013   Dysphagia, pharyngoesophageal phase 06/26/2013   Medication side effect 03/25/2013   Diarrhea 03/25/2013   Benign positional vertigo 01/30/2013   Traumatic hematoma of thigh 01/15/2013   HTN (hypertension) 09/02/2012   Dry skin 08/22/2012   External hemorrhoids 08/22/2012   Obesity 06/30/2012   Thyromegaly 05/22/2012   Back pain 05/22/2012   Elevated glucose 05/22/2012   Moderate persistent asthma 04/19/2012   Dyspnea 01/28/2012    PCP: Donato Schultz, DO  REFERRING PROVIDER: Lorriane Shire, MD    REFERRING DIAG: N39.46 (ICD-10-CM) - Mixed stress and urge urinary incontinence  THERAPY DIAG:  Muscle weakness (generalized)  Abnormal posture  Unspecified lack of coordination  Rationale for Evaluation and Treatment: Rehabilitation  ONSET DATE: several months  SUBJECTIVE:                                                                                                                                                                                           SUBJECTIVE STATEMENT: Sign language interpreter present throughout session for all communication.   Pt reports with medication leakage has lessened a lot. Reports she has only  had leakage if forgets to take meds.  Fluid intake: Yes: water - at least 2 large bottles at work    PAIN:  Are you having pain? No 06/18/23   PRECAUTIONS: Other: hearing impaired   WEIGHT BEARING RESTRICTIONS: No  FALLS:  Has patient fallen in last 6 months?  No  LIVING ENVIRONMENT: Lives with: lives with their family Lives in: House/apartment   OCCUPATION: [employer]   PLOF: Independent  PATIENT GOALS: to have less leakage  PERTINENT HISTORY:  Arthritis, deaf, DM, GERD, HTN, thyroid disease, THA Lt, C-section  Sexual abuse: No  BOWEL MOVEMENT: Pain with bowel movement: No Type of bowel movement:Type (Bristol Stool Scale) 4-5, Frequency daily, and Strain No Fully empty rectum: No Leakage: No Pads: No Fiber supplement: No  URINATION: Pain with urination: No Fully empty bladder: Yes:   Stream: Weak Urgency: Yes: worse at night and drinks a lot of water during the night.  Frequency: 1-2x at night; unsure how often during the day Leakage:  sometimes at night worse, unable to make it to the bathroom with urge;  Pads: No  INTERCOURSE: Pain with intercourse:  not active Ability to have vaginal penetration:  Yes:   Climax: not painful Marinoff Scale: 0/3  PREGNANCY: Vaginal deliveries 2 Tearing No C-section deliveries 1 Currently pregnant No  PROLAPSE: None   OBJECTIVE:   DIAGNOSTIC FINDINGS:    COGNITION: Overall cognitive status: Within functional limits for tasks assessed     SENSATION: Light touch: Appears intact Proprioception: Appears intact  MUSCLE LENGTH: Bil hamstrings and adductors limited by 50%   POSTURE: rounded shoulders, forward head, and anterior pelvic tilt  PELVIC ALIGNMENT: Rt anterior rotation   LUMBARAROM/PROM:  A/PROM A/PROM  eval  Flexion Limited by 50%  Extension WFL  Right lateral flexion Limited by 25%  Left lateral flexion Limited by 25%  Right rotation Limited by 50%  Left rotation Limited by 50%    (Blank rows = not tested)  LOWER EXTREMITY ROM:  WFL however tightness at Rt adduction, ER and IR, and flexion but functional   LOWER EXTREMITY MMT:  Bil hip abduction 3+/5, all other hip 4-/5 and Rt side with Rt groin pain  PALPATION:   General  tightness at lumbar spine, bil gluteals, Rt ITB, hip complex. TTP Rt groin Patient confirms identification and approves PT to assess internal pelvic floor and treatment yes 06/11/23   PELVIC MMT:   MMT 06/11/23   Vaginal 1/5, 1s, 2 reps  Internal Anal Sphincter   External Anal Sphincter   Puborectalis   Diastasis Recti   (Blank rows = not tested)        TONE: Decreased PROLAPSE: Not seen in hooklying  TODAY'S TREATMENT:                                                                                                                              DATE:   07/16/23: NMRE: all exercises cued for breathing mechanics and pelvic floor contraction with coordination to improve pelvic floor strength and coordination for decreased leakage Opp hand/knee ball press 2x10 each in sitting Hip flexion black loop sitting 2x10  Hip abduction black loop 2x10 sitting X10 Sit to stand  Farmers carry 5# 750'    PATIENT EDUCATION:  Education details:  HEP, and bladder irritants and urge drill  Person educated: Patient Education method: Explanation, Demonstration, Tactile cues, Verbal cues, and Handouts Education comprehension: verbalized understanding, returned demonstration, verbal cues required, tactile cues required, and needs further education  HOME EXERCISE PROGRAM: [id number]  ASSESSMENT:  CLINICAL IMPRESSION: Patient presents for treatment, coordination pelvic floor with exercise and exhale. Pt tolerated well and denied leakage throughout. Has follow up with referring MD next week and wants to discuss with her prior to DC from PT. Has one additional appt if needed but if MD is ok with DC plans to cancel this but wants to review improvements  with MD then would be agreeable to DC from PT.  Pt would benefit from additional PT to further address deficits and feel confident with DC recommendations.     OBJECTIVE IMPAIRMENTS: decreased activity tolerance, decreased coordination, decreased endurance, difficulty walking, decreased strength, increased fascial restrictions, increased muscle spasms, impaired flexibility, improper body mechanics, postural dysfunction, obesity, and pain.   ACTIVITY LIMITATIONS: carrying, lifting, squatting, continence, and locomotion level  PARTICIPATION LIMITATIONS: community activity  PERSONAL FACTORS: Time since onset of injury/illness/exacerbation and 1 comorbidity: medical history  are also affecting patient's functional outcome.   REHAB POTENTIAL: Good  CLINICAL DECISION MAKING: Stable/uncomplicated  EVALUATION COMPLEXITY: Low   GOALS: Goals reviewed with patient? Yes  SHORT TERM GOALS: Target date: 05/14/23  Pt to be I with HEP.  Baseline: Goal status: MET  2.  Pt to report improved time between bladder voids to at least 4 hours at night for improved QOL with decreased urinary frequency.   Baseline:  Goal status: MET  3.  Pt will have 25% less urgency due to bladder retraining and strengthening  Baseline:  Goal status: MET  4.  Pt to be I with urge drill for decreased  leakage instances.  Baseline:  Goal status: MET   LONG TERM GOALS: Target date: 07/17/23  Pt to be I with advanced HEP.  Baseline:  Goal status: MET  2.  Pt will have 50% less urgency due to bladder retraining and strengthening  Baseline:  Goal status: MET  3. Pt to demonstrate at least 3/5 pelvic floor strength for improved pelvic stability and decreased strain at pelvic floor/ decrease leakage.  Baseline:  Goal status: not reassessed yet  4.  Pt to demonstrate improved coordination of pelvic floor and breathing mechanics with 10# squat with appropriate synergistic patterns to decrease pain and leakage at  least 75% of the time for improved strength at pelvic floor .    Baseline:  Goal status:MET  5.  Pt will have 75% reduced leakage during a typical day  Baseline:  Goal status: MET   PLAN:  PT FREQUENCY: 1x/week  PT DURATION:  8 sessions  PLANNED INTERVENTIONS: Therapeutic exercises, Therapeutic activity, Neuromuscular re-education, Patient/Family education, Self Care, Joint mobilization, Aquatic Therapy, Dry Needling, Electrical stimulation, Cryotherapy, Moist heat, scar mobilization, Taping, Biofeedback, and Manual therapy  PLAN FOR NEXT SESSION: pelvic floor strengthening, coordination of pelvic floor with breathing and exercises, lifting mechanics, hip and core strengthening, urge drill review, bladder retraining, stretching hips  Otelia Sergeant, PT, DPT 09/09/244:11 PM

## 2023-07-18 ENCOUNTER — Other Ambulatory Visit (HOSPITAL_BASED_OUTPATIENT_CLINIC_OR_DEPARTMENT_OTHER): Payer: Self-pay

## 2023-07-18 ENCOUNTER — Encounter: Payer: Self-pay | Admitting: Family Medicine

## 2023-07-18 ENCOUNTER — Ambulatory Visit (INDEPENDENT_AMBULATORY_CARE_PROVIDER_SITE_OTHER): Payer: Federal, State, Local not specified - PPO

## 2023-07-18 ENCOUNTER — Other Ambulatory Visit: Payer: Self-pay | Admitting: Family Medicine

## 2023-07-18 DIAGNOSIS — I10 Essential (primary) hypertension: Secondary | ICD-10-CM

## 2023-07-18 DIAGNOSIS — Z23 Encounter for immunization: Secondary | ICD-10-CM | POA: Diagnosis not present

## 2023-07-18 MED ORDER — COMIRNATY 30 MCG/0.3ML IM SUSY
0.3000 mL | PREFILLED_SYRINGE | Freq: Once | INTRAMUSCULAR | 0 refills | Status: AC
  Filled 2023-07-18: qty 0.3, 1d supply, fill #0

## 2023-07-18 NOTE — Progress Notes (Signed)
Pt was in office today for her flu injection, injection was given in L deltoid and pt tolerated well.

## 2023-07-19 ENCOUNTER — Ambulatory Visit: Payer: Federal, State, Local not specified - PPO | Admitting: Obstetrics and Gynecology

## 2023-07-19 VITALS — BP 132/65 | HR 75 | Wt 214.0 lb

## 2023-07-19 DIAGNOSIS — N3946 Mixed incontinence: Secondary | ICD-10-CM

## 2023-07-19 DIAGNOSIS — Z603 Acculturation difficulty: Secondary | ICD-10-CM | POA: Diagnosis not present

## 2023-07-19 DIAGNOSIS — Z758 Other problems related to medical facilities and other health care: Secondary | ICD-10-CM

## 2023-07-19 DIAGNOSIS — L292 Pruritus vulvae: Secondary | ICD-10-CM | POA: Diagnosis not present

## 2023-07-19 NOTE — Progress Notes (Signed)
GYNECOLOGY VISIT  Patient name: Rhonda Morrow MRN [id number]  Date of birth: October 03, 1961 Chief Complaint:   vulvar itching and Urinary Incontinence   History:  Rhonda Morrow is a 62 y.o. [phone] being seen today for mixed UI.   Feels PFPT has been doing well. She has one session remaining and wanted to check in to see if she should continue or stop.  Day voids: q2-3 hrs Night voids: 3x She did not have her meds for 2 weeks and had increased leaks and leaks decreased when meds resumed. Night time voiding is improving. Has had increased life stressors and feeling very tired. Drinks soda or water. No coffee. Occasional tea.  Has been using cream and has had relief of itching. Does not engage in hair removal. Feels the cream helps a lot and is able to fall asleep after application.  Currently using dove to clean her vulva. Not able to see vulva to notice any skin changes   Past Medical History:  Diagnosis Date   Arthritis    Asthma    Complication of anesthesia    one time woke up and was vey scared,17 yrs ago   Deaf    Diabetes mellitus without complication (HCC)    GERD (gastroesophageal reflux disease)    Hypertension    Left groin pain    Nausea with vomiting 02/19/2014   Thyroid disease     Past Surgical History:  Procedure Laterality Date   CARDIAC CATHETERIZATION     CESAREAN SECTION     CHOLECYSTECTOMY N/A 06/20/2016   Procedure: LAPAROSCOPIC CHOLECYSTECTOMY;  Surgeon: Emelia Loron, MD;  Location: [facility] OR;  Service: General;  Laterality: N/A;   ESOPHAGEAL MANOMETRY N/A 09/29/2013   Procedure: ESOPHAGEAL MANOMETRY (EM);  Surgeon: Rachael Fee, MD;  Location: [facility];  Service: Endoscopy;  Laterality: N/A;   TOTAL HIP ARTHROPLASTY Left 02/21/2017   Procedure: LEFT TOTAL HIP ARTHROPLASTY ANTERIOR APPROACH;  Surgeon: Tarry Kos, MD;  Location: [facility] OR;  Service: Orthopedics;  Laterality: Left;    The following portions of the patient's history were reviewed and  updated as appropriate: allergies, current medications, past family history, past medical history, past social history, past surgical history and problem list.     Review of Systems:  Pertinent items are noted in HPI. Comprehensive review of systems was otherwise negative.   Objective:  Physical Exam BP 132/65   Pulse 75   Wt 214 lb (97.1 kg)   LMP 05/20/2012   BMI 39.14 kg/m    Physical Exam Vitals and nursing note reviewed. Exam conducted with a chaperone present.  Constitutional:      Appearance: Normal appearance.  HENT:     Head: Normocephalic and atraumatic.  Pulmonary:     Effort: Pulmonary effort is normal.     Breath sounds: Normal breath sounds.  Genitourinary:    General: Normal vulva.     Exam position: Lithotomy position.     Vagina: Normal.     Cervix: Normal.     Comments: Points to lower right labia majora as site of itching - no lesions or skin changes noted Liquid stool noted on anus  Skin:    General: Skin is warm and dry.  Neurological:     General: No focal deficit present.     Mental Status: She is alert.  Psychiatric:        Mood and Affect: Mood normal.        Behavior: Behavior normal.  Thought Content: Thought content normal.        Judgment: Judgment normal.     Labs and Imaging No results found.     Assessment & Plan:   1. Vulvar itching Continue prn topical antifungal. Recommend dove sensitive bar to cleanse. Recommend use of bidet for cleaning after BM as not always completely clean after evacuation.   2. Mixed stress and urge urinary incontinence Recommend continue PFPT per PT recommendations as still has nocturia and no documented improvement in kegal strength. Recommend decreasing caffeine intact  3. Language barrier In person sigh language interpreter   Routine preventative health maintenance measures emphasized.  Lorriane Shire, MD [facility], [facility]

## 2023-07-19 NOTE — Progress Notes (Signed)
[facility] interpreter Olegario Messier.

## 2023-07-19 NOTE — Progress Notes (Signed)
Follow up mix urinary incontinence. Armandina Stammer RN

## 2023-07-19 NOTE — Patient Instructions (Signed)
Use a bidet to help with cleaning yourself after a bowel movement  Continue with physical therapy

## 2023-07-20 ENCOUNTER — Encounter: Payer: Self-pay | Admitting: Family Medicine

## 2023-07-20 ENCOUNTER — Ambulatory Visit (INDEPENDENT_AMBULATORY_CARE_PROVIDER_SITE_OTHER): Payer: Federal, State, Local not specified - PPO | Admitting: Family Medicine

## 2023-07-20 VITALS — BP 130/80 | HR 71 | Temp 97.7°F | Resp 18 | Ht 62.0 in | Wt 213.6 lb

## 2023-07-20 DIAGNOSIS — R1013 Epigastric pain: Secondary | ICD-10-CM

## 2023-07-20 DIAGNOSIS — Z23 Encounter for immunization: Secondary | ICD-10-CM | POA: Diagnosis not present

## 2023-07-20 DIAGNOSIS — M62838 Other muscle spasm: Secondary | ICD-10-CM

## 2023-07-20 DIAGNOSIS — K582 Mixed irritable bowel syndrome: Secondary | ICD-10-CM | POA: Diagnosis not present

## 2023-07-20 DIAGNOSIS — E1169 Type 2 diabetes mellitus with other specified complication: Secondary | ICD-10-CM

## 2023-07-20 DIAGNOSIS — I10 Essential (primary) hypertension: Secondary | ICD-10-CM | POA: Diagnosis not present

## 2023-07-20 DIAGNOSIS — E1165 Type 2 diabetes mellitus with hyperglycemia: Secondary | ICD-10-CM | POA: Diagnosis not present

## 2023-07-20 DIAGNOSIS — E785 Hyperlipidemia, unspecified: Secondary | ICD-10-CM

## 2023-07-20 DIAGNOSIS — R102 Pelvic and perineal pain: Secondary | ICD-10-CM

## 2023-07-20 DIAGNOSIS — R1024 Suprapubic pain: Secondary | ICD-10-CM

## 2023-07-20 DIAGNOSIS — Z7984 Long term (current) use of oral hypoglycemic drugs: Secondary | ICD-10-CM

## 2023-07-20 MED ORDER — RYBELSUS 7 MG PO TABS
7.0000 mg | ORAL_TABLET | Freq: Every day | ORAL | 1 refills | Status: DC
Start: 2023-07-20 — End: 2023-07-20

## 2023-07-20 MED ORDER — RYBELSUS 7 MG PO TABS
7.0000 mg | ORAL_TABLET | Freq: Every day | ORAL | 1 refills | Status: DC
Start: 2023-07-20 — End: 2024-07-22

## 2023-07-20 MED ORDER — OMEPRAZOLE 20 MG PO CPDR
20.0000 mg | DELAYED_RELEASE_CAPSULE | Freq: Two times a day (BID) | ORAL | 1 refills | Status: DC
Start: 2023-07-20 — End: 2023-08-21

## 2023-07-20 NOTE — Assessment & Plan Note (Signed)
Stable. Cont omeprazole

## 2023-07-20 NOTE — Progress Notes (Signed)
Established Patient Office Visit  Subjective   Patient ID: Rhonda Morrow, female    DOB: 1961-04-22  Age: 62 y.o. MRN: [id number]  Chief Complaint  Patient presents with   Abdominal Pain    4 months, pt states feeling scratching in her belly prior to bowel movement. Some bloating    HPI Discussed the use of AI scribe software for clinical note transcription with the patient, who gave verbal consent to proceed.  History of Present Illness   The patient, with a history of diverticulosis and urinary incontinence, presents with abdominal discomfort. She describes the discomfort as a rough, scratching sensation located in the upper abdomen, occurring prior to bowel movements. The discomfort is not associated with cramping or pain during bowel movements. The patient's bowel habits vary, with occasional constipation and diarrhea. The patient's urinary incontinence has improved with medication.      Patient Active Problem List   Diagnosis Date Noted   Irritable bowel syndrome with both constipation and diarrhea 07/20/2023   Urinary incontinence 01/31/2023   Vaginal itching 01/31/2023   Influenza 11/20/2022   Postmenopausal bleeding 08/18/2022   Acute non-recurrent pansinusitis 06/18/2022   Leg cramps 03/08/2022   Muscle spasm of right lower extremity 03/08/2022   Primary osteoarthritis of right hip 03/01/2022   ACE inhibitor intolerance 01/19/2022   Weakness 12/22/2021   Left lower quadrant abdominal pain 12/22/2021   Slow transit constipation 12/22/2021   Class 3 severe obesity due to excess calories without serious comorbidity with body mass index (BMI) of 40.0 to 44.9 in adult [facility]) 07/05/2021   Restless leg syndrome 07/05/2021   Chronic seasonal allergic rhinitis due to pollen 07/05/2021   Psychophysiological insomnia 07/05/2021   Non-compliance 07/05/2021   COVID-19 07/05/2021   Right wrist pain 06/05/2021   Acute pain of left shoulder 01/24/2021   Acute pain of right shoulder  01/24/2021   Foot pain, bilateral 01/24/2021   Dyspepsia 01/24/2021   Preventative health care 01/24/2021   Right hand pain 12/29/2020   OSA (obstructive sleep apnea) 09/12/2020   Nocturia more than twice per night 08/03/2020   Non-restorative sleep 08/03/2020   Leg cramping 08/03/2020   Hyperlipidemia associated with type 2 diabetes mellitus (HCC) 07/25/2020   Large breasts 07/22/2020   RLS (restless legs syndrome) 05/17/2020   Cough 04/01/2020   Asthma, moderate persistent, poorly-controlled 04/01/2020   Medication management 04/01/2020   Menopausal symptoms 03/29/2020   Hot flashes 03/29/2020   Chronic right shoulder pain 08/11/2019   Acute non-recurrent maxillary sinusitis 08/11/2019   BMI 40.0-44.9, adult (HCC) 01/22/2019   Right lower quadrant abdominal pain 10/20/2018   Rectal pain 10/20/2018   Vitamin B 12 deficiency 03/28/2018   B12 deficiency 03/28/2018   DM (diabetes mellitus) type II uncontrolled, periph vascular disorder 03/28/2018   Hyperlipidemia 03/28/2018   Primary osteoarthritis of left hip 02/21/2017   Status post total hip replacement, left 02/21/2017   History of laparoscopic cholecystectomy 06/20/2016   Cerumen impaction 06/09/2016   Hypersomnia 05/16/2016   Knee pain, right 05/05/2016   RUQ pain 04/09/2016   Abnormal CT scan 03/28/2016   Dyspnea and respiratory abnormalities 03/15/2016   Asthma with acute exacerbation 03/15/2016   Asthma in adult 02/25/2016   DM (diabetes mellitus) type II controlled, neurological manifestation (HCC) 02/09/2016   Left hip pain 12/23/2015   Right hamstring muscle strain 11/18/2015   Abdominal pain, acute 09/01/2015   Acute asthma exacerbation 04/16/2015   Acute bronchitis 04/14/2015   Pap smear for cervical  cancer screening 09/14/2014   Bed bug bite 05/06/2014   Allergic rhinitis 04/09/2014   Rectal itching 04/09/2014   Bladder spasm 04/09/2014   Knee pain, left 03/05/2014   Hypokalemia 02/19/2014   Nausea with  vomiting 02/19/2014   Diabetes mellitus, type II (HCC) 02/19/2014   Edema 02/16/2014   Disorder of rotator cuff 11/12/2013   Routine general medical examination at a health care facility 08/20/2013   Gastroesophageal reflux disease 06/26/2013   Dysphagia, pharyngoesophageal phase 06/26/2013   Suprapubic abdominal pain 05/12/2013   Medication side effect 03/25/2013   Diarrhea 03/25/2013   Benign positional vertigo 01/30/2013   Traumatic hematoma of thigh 01/15/2013   HTN (hypertension) 09/02/2012   Dry skin 08/22/2012   External hemorrhoids 08/22/2012   Obesity 06/30/2012   Thyromegaly 05/22/2012   Back pain 05/22/2012   Elevated glucose 05/22/2012   Moderate persistent asthma 04/19/2012   Dyspnea 01/28/2012   Past Medical History:  Diagnosis Date   Arthritis    Asthma    Complication of anesthesia    one time woke up and was vey scared,17 yrs ago   Deaf    Diabetes mellitus without complication (HCC)    GERD (gastroesophageal reflux disease)    Hypertension    Left groin pain    Nausea with vomiting 02/19/2014   Thyroid disease    Past Surgical History:  Procedure Laterality Date   CARDIAC CATHETERIZATION     CESAREAN SECTION     CHOLECYSTECTOMY N/A 06/20/2016   Procedure: LAPAROSCOPIC CHOLECYSTECTOMY;  Surgeon: Emelia Loron, MD;  Location: [facility] OR;  Service: General;  Laterality: N/A;   ESOPHAGEAL MANOMETRY N/A 09/29/2013   Procedure: ESOPHAGEAL MANOMETRY (EM);  Surgeon: Rachael Fee, MD;  Location: [facility] ENDOSCOPY;  Service: Endoscopy;  Laterality: N/A;   TOTAL HIP ARTHROPLASTY Left 02/21/2017   Procedure: LEFT TOTAL HIP ARTHROPLASTY ANTERIOR APPROACH;  Surgeon: Tarry Kos, MD;  Location: [facility] OR;  Service: Orthopedics;  Laterality: Left;   Social History   Tobacco Use   Smoking status: Never   Smokeless tobacco: Never  Vaping Use   Vaping status: Never Used  Substance Use Topics   Alcohol use: No    Alcohol/week: 0.0 standard drinks of alcohol   Drug use:  No   Social History   Socioeconomic History   Marital status: Married    Spouse name: Not on file   Number of children: Not on file   Years of education: Not on file   Highest education level: Not on file  Occupational History   Occupation: disabled  Tobacco Use   Smoking status: Never   Smokeless tobacco: Never  Vaping Use   Vaping status: Never Used  Substance and Sexual Activity   Alcohol use: No    Alcohol/week: 0.0 standard drinks of alcohol   Drug use: No   Sexual activity: Not Currently  Other Topics Concern   Not on file  Social History Narrative   Lives with family.  Has 3 children.  Works for [employer]  Education: high school.   Social Determinants of Health   Financial Resource Strain: Not on file  Food Insecurity: Not on file  Transportation Needs: Not on file  Physical Activity: Not on file  Stress: Not on file  Social Connections: Unknown (03/18/2022)   Received [facility], [facility]   Social Network    Social Network: Not on file  Intimate Partner Violence: Unknown (02/08/2022)   [facility], [facility]  HITS    Physically Hurt: Not on file    Insult or Talk Down To: Not on file    Threaten Physical Harm: Not on file    Scream or Curse: Not on file   Family Status  Relation Name Status   Mother  (Not Specified)   Father  (Not Specified)  No partnership data on file   Family History  Problem Relation Age of Onset   Diabetes Mother    Heart disease Mother    Diabetes Father    Allergies  Allergen Reactions   Losartan Shortness Of Breath   Oxycodone-Acetaminophen Nausea And Vomiting   Augmentin [Amoxicillin-Pot Clavulanate] Diarrhea      Review of Systems  Constitutional:  Negative for chills, fever and malaise/fatigue.  HENT:  Negative for congestion and hearing loss.   Eyes:  Negative for blurred vision and discharge.  Respiratory:  Negative for cough, sputum production and shortness of breath.    Cardiovascular:  Negative for chest pain, palpitations and leg swelling.  Gastrointestinal:  Positive for abdominal pain, constipation and diarrhea. Negative for blood in stool, heartburn, nausea and vomiting.  Genitourinary:  Negative for dysuria, flank pain, frequency, hematuria and urgency.  Musculoskeletal:  Negative for back pain, falls and myalgias.  Skin:  Negative for rash.  Neurological:  Negative for dizziness, sensory change, loss of consciousness, weakness and headaches.  Endo/Heme/Allergies:  Negative for environmental allergies. Does not bruise/bleed easily.  Psychiatric/Behavioral:  Negative for depression and suicidal ideas. The patient is not nervous/anxious and does not have insomnia.       Objective:     BP 130/80 (BP Location: Left Arm, Patient Position: Sitting, Cuff Size: Normal)   Pulse 71   Temp 97.7 F (36.5 C) (Oral)   Resp 18   Ht 5\' 2"  (1.575 m)   Wt 213 lb 9.6 oz (96.9 kg)   LMP 05/20/2012   SpO2 100%   BMI 39.07 kg/m  BP Readings from Last 3 Encounters:  07/20/23 130/80  07/19/23 132/65  05/21/23 120/80   Wt Readings from Last 3 Encounters:  07/20/23 213 lb 9.6 oz (96.9 kg)  07/19/23 214 lb (97.1 kg)  05/21/23 211 lb 3.2 oz (95.8 kg)   SpO2 Readings from Last 3 Encounters:  07/20/23 100%  05/21/23 98%  04/03/23 98%      Physical Exam Vitals and nursing note reviewed.  Constitutional:      General: She is not in acute distress.    Appearance: Normal appearance. She is well-developed.  HENT:     Head: Normocephalic and atraumatic.     Right Ear: Tympanic membrane, ear canal and external ear normal. There is no impacted cerumen.     Left Ear: Tympanic membrane, ear canal and external ear normal. There is no impacted cerumen.     Nose: Nose normal.     Mouth/Throat:     Mouth: Mucous membranes are moist.     Pharynx: Oropharynx is clear. No oropharyngeal exudate or posterior oropharyngeal erythema.  Eyes:     General: No scleral  icterus.       Right eye: No discharge.        Left eye: No discharge.     Conjunctiva/sclera: Conjunctivae normal.     Pupils: Pupils are equal, round, and reactive to light.  Neck:     Thyroid: No thyromegaly or thyroid tenderness.     Vascular: No JVD.  Cardiovascular:     Rate and Rhythm: Normal rate and regular  rhythm.     Heart sounds: Normal heart sounds. No murmur heard. Pulmonary:     Effort: Pulmonary effort is normal. No respiratory distress.     Breath sounds: Normal breath sounds.  Abdominal:     General: Bowel sounds are normal. There is no distension.     Palpations: Abdomen is soft. There is no mass.     Tenderness: There is abdominal tenderness in the suprapubic area. There is no guarding or rebound.  Genitourinary:    Vagina: Normal.  Musculoskeletal:        General: Normal range of motion.     Cervical back: Normal range of motion and neck supple.     Right lower leg: No edema.     Left lower leg: No edema.  Lymphadenopathy:     Cervical: No cervical adenopathy.  Skin:    General: Skin is warm and dry.     Findings: No erythema or rash.  Neurological:     Mental Status: She is alert and oriented to person, place, and time.     Cranial Nerves: No cranial nerve deficit.     Deep Tendon Reflexes: Reflexes are normal and symmetric.  Psychiatric:        Mood and Affect: Mood normal.        Behavior: Behavior normal.        Thought Content: Thought content normal.        Judgment: Judgment normal.      No results found for any visits on 07/20/23.  Last CBC Lab Results  Component Value Date   WBC 6.7 12/14/2022   HGB 14.0 12/14/2022   HCT 42.7 12/14/2022   MCV 84.2 12/14/2022   MCH 27.2 08/18/2022   RDW 15.4 12/14/2022   PLT 129.0 (L) 12/14/2022   Last metabolic panel Lab Results  Component Value Date   GLUCOSE 82 12/14/2022   NA 144 12/14/2022   K 3.5 12/14/2022   CL 100 12/14/2022   CO2 30 12/14/2022   BUN 15 12/14/2022   CREATININE 1.01  12/14/2022   GFR 61.04 12/14/2022   CALCIUM 9.3 12/14/2022   PHOS 3.0 03/07/2022   PROT 6.5 12/14/2022   ALBUMIN 3.8 12/14/2022   BILITOT 0.6 12/14/2022   ALKPHOS 63 12/14/2022   AST 23 12/14/2022   ALT 21 12/14/2022   ANIONGAP 13 05/24/2020   Last lipids Lab Results  Component Value Date   CHOL 176 12/05/2021   HDL 55.10 12/05/2021   LDLCALC 104 (H) 12/05/2021   TRIG 85.0 12/05/2021   CHOLHDL 3 12/05/2021   Last hemoglobin A1c Lab Results  Component Value Date   HGBA1C 6.9 (H) 12/14/2022   Last thyroid functions Lab Results  Component Value Date   TSH 1.23 08/18/2022   T4TOTAL 10.7 12/22/2021   Last vitamin D Lab Results  Component Value Date   VD25OH 52.50 02/08/2016   Last vitamin B12 and Folate Lab Results  Component Value Date   VITAMINB12 868 12/22/2021   FOLATE 19.2 05/14/2019      The 10-year ASCVD risk score (Arnett DK, et al., 2019) is: 13.7%    Assessment & Plan:   Problem List Items Addressed This Visit       Unprioritized   Diabetes mellitus, type II (HCC)   Relevant Medications   Semaglutide (RYBELSUS) 7 MG TABS   Other Relevant Orders   CBC with Differential/Platelet   Comprehensive metabolic panel   Hemoglobin A1c   Lipid panel   Muscle spasm  of right lower extremity   Suprapubic abdominal pain   Relevant Orders   POCT Urinalysis Dipstick (Automated)   Irritable bowel syndrome with both constipation and diarrhea - Primary    Con't with fiber supplement and consider probiotic Refer to GI      Relevant Medications   omeprazole (PRILOSEC) 20 MG capsule   Other Relevant Orders   Ambulatory referral to Gastroenterology   Hyperlipidemia associated with type 2 diabetes mellitus (HCC)    Tolerating statin, encouraged heart healthy diet, avoid trans fats, minimize simple carbs and saturated fats. Increase exercise as tolerated       Relevant Medications   Semaglutide (RYBELSUS) 7 MG TABS   HTN (hypertension)    Well controlled,  no changes to meds. Encouraged heart healthy diet such as the DASH diet and exercise as tolerated.        Relevant Orders   Lipid panel   POCT Urinalysis Dipstick (Automated)   Dyspepsia    Stable  Con't omeprazole      Relevant Medications   omeprazole (PRILOSEC) 20 MG capsule   Other Visit Diagnoses     Need for shingles vaccine       Relevant Orders   Zoster Recombinant (Shingrix ) (Completed)     Assessment and Plan    Abdominal Discomfort Reports of discomfort in the lower abdomen, described as a rough sensation, prior to bowel movements. Bowel movements are sometimes hard, sometimes soft, and occasionally diarrhea. No blood in stool. No pain with urination. Last colonoscopy was in 2014,-- no diverticulosis -Refer to gastroenterology for evaluation and possible colonoscopy. -Increase fiber intake and consider adding a probiotic.  Vaccinations Received COVID and flu vaccines on the same day with subsequent arm soreness and feeling unwell. -Administer first dose of shingles vaccine today. -Advise patient to return for second dose of shingles vaccine in 2-6 months.  Medication Refills Reports taking Rybelsus and omeprazole. -Refill Rybelsus and omeprazole for 90 days, to [facility] at [facility] [address]  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 -Provide work excuse for today, return to work Advertising account executive.  General Health Maintenance -Order routine blood work.        Return if symptoms worsen or fail to improve.    Donato Schultz, DO

## 2023-07-20 NOTE — Assessment & Plan Note (Addendum)
Con't with fiber supplement and consider probiotic Refer to GI

## 2023-07-20 NOTE — Assessment & Plan Note (Signed)
Tolerating statin, encouraged heart healthy diet, avoid trans fats, minimize simple carbs and saturated fats. Increase exercise as tolerated

## 2023-07-20 NOTE — Assessment & Plan Note (Signed)
Well controlled, no changes to meds. Encouraged heart healthy diet such as the DASH diet and exercise as tolerated.  °

## 2023-07-21 LAB — CBC WITH DIFFERENTIAL/PLATELET
Absolute Monocytes: 759 {cells}/uL (ref 200–950)
Basophils Absolute: 31 {cells}/uL (ref 0–200)
Basophils Relative: 0.6 %
Eosinophils Absolute: 42 {cells}/uL (ref 15–500)
Eosinophils Relative: 0.8 %
HCT: 43.3 % (ref 35.0–45.0)
Hemoglobin: 14.1 g/dL (ref 11.7–15.5)
Lymphs Abs: 1576 {cells}/uL (ref 850–3900)
MCH: 27.6 pg (ref 27.0–33.0)
MCHC: 32.6 g/dL (ref 32.0–36.0)
MCV: 84.7 fL (ref 80.0–100.0)
MPV: 13.1 fL — ABNORMAL HIGH (ref 7.5–12.5)
Monocytes Relative: 14.6 %
Neutro Abs: 2792 {cells}/uL (ref 1500–7800)
Neutrophils Relative %: 53.7 %
Platelets: 188 10*3/uL (ref 140–400)
RBC: 5.11 10*6/uL — ABNORMAL HIGH (ref 3.80–5.10)
RDW: 13.2 % (ref 11.0–15.0)
Total Lymphocyte: 30.3 %
WBC: 5.2 10*3/uL (ref 3.8–10.8)

## 2023-07-21 LAB — LIPID PANEL
Cholesterol: 113 mg/dL (ref <200)
HDL: 60 mg/dL (ref >=50)
LDL Cholesterol (Calc): 38 mg/dL
Non-HDL Cholesterol (Calc): 53 mg/dL (ref <130)
Total CHOL/HDL Ratio: 1.9 (calc) (ref <5.0)
Triglycerides: 66 mg/dL (ref <150)

## 2023-07-21 LAB — COMPREHENSIVE METABOLIC PANEL
AG Ratio: 1.3 (calc) (ref 1.0–2.5)
ALT: 16 U/L (ref 6–29)
AST: 16 U/L (ref 10–35)
Albumin: 3.7 g/dL (ref 3.6–5.1)
Alkaline phosphatase (APISO): 73 U/L (ref 37–153)
BUN: 14 mg/dL (ref 7–25)
CO2: 27 mmol/L (ref 20–32)
Calcium: 9.5 mg/dL (ref 8.6–10.4)
Chloride: 100 mmol/L (ref 98–110)
Creat: 0.99 mg/dL (ref 0.50–1.03)
Globulin: 2.9 g/dL (ref 1.9–3.7)
Glucose, Bld: 108 mg/dL — ABNORMAL HIGH (ref 65–99)
Potassium: 3.4 mmol/L — ABNORMAL LOW (ref 3.5–5.3)
Sodium: 139 mmol/L (ref 135–146)
Total Bilirubin: 0.5 mg/dL (ref 0.2–1.2)
Total Protein: 6.6 g/dL (ref 6.1–8.1)

## 2023-07-21 LAB — HEMOGLOBIN A1C
Hgb A1c MFr Bld: 6.7 %{Hb} — ABNORMAL HIGH (ref <5.7)
Mean Plasma Glucose: 146 mg/dL
eAG (mmol/L): 8.1 mmol/L

## 2023-07-24 ENCOUNTER — Telehealth: Payer: Self-pay

## 2023-07-24 NOTE — Telephone Encounter (Signed)
Noted  

## 2023-07-24 NOTE — Telephone Encounter (Signed)
Received call from [place], [place] w/ triage, she wanted to report a ED refusal, Pt called- she is having chest pain reported as burning that has been going on for the last week, worsening in the last 24 hours, she has taken her "reflux meds" but that didn't help. Pt was seen o Friday, 9/13 for abd pain. When asked if Pt was still on the phone (Pt is deaf and requires a sign language intrepreter) Marchelle Folks told me that the Pt wasn't.   Will attach full triage note when its available in S drive.

## 2023-07-24 NOTE — Telephone Encounter (Signed)
Initial Comment Caller states that she is having burning and pain in her chest and in her heart. The medication she took did not work. Translation No Nurse Assessment Nurse: [place], RN, Marchelle Folks Date/Time (Eastern Time): 07/24/2023 11:08:48 AM Confirm and document reason for call. If symptomatic, describe symptoms. ---caller states she started feeling burning and pain that is pretty bad. she took a reflux med and drank some water and still had a lot of discomfort. med did not help. pain started last week. last night had the pain and she drank water and that helped. did not sleep all night. Does the patient have any new or worsening symptoms? ---Yes Will a triage be completed? ---Yes Related visit to physician within the last 2 weeks? ---Yes Does the PT have any chronic conditions? (i.e. diabetes, asthma, this includes High risk factors for pregnancy, etc.) ---Unknown Is this a behavioral health or substance abuse call? ---No Guidelines Guideline Title Affirmed Question Affirmed Notes Nurse Date/Time (Eastern Time) Chest Pain [1] Chest pain (or "angina") comes and goes AND [2] is happening more often (increasing in frequency) or getting worse (increasing in severity) (Exception: [place], RN, Marchelle Folks 07/24/2023 11:12:02 AM PLEASE NOTE: All timestamps contained within this report are represented as [place] Standard Time. CONFIDENTIALTY NOTICE: This fax transmission is intended only for the addressee. It contains information that is legally privileged, confidential or otherwise protected from use or disclosure. If you are not the intended recipient, you are strictly prohibited from reviewing, disclosing, copying using or disseminating any of this information or taking any action in reliance on or regarding this information. If you have received this fax in error, please notify us immediately by telephone so that we can arrange for its return to [place]. Phone: [phone], Toll-Free:  [phone], Fax: [phone] Page: 2 of 2 Call Id: [id number] Guidelines Guideline Title Affirmed Question Affirmed Notes Nurse Date/Time Lamount Cohen Time) Chest pains that last only a few seconds.) Disp. Time Lamount Cohen Time) Disposition Final User 07/24/2023 11:06:53 AM Send to Urgent Queue Cherylynn Ridges 07/24/2023 11:19:08 AM Go to ED Now Yes [place], RN, Marchelle Folks Final Disposition 07/24/2023 11:19:08 AM Go to ED Now Yes [place], RN, Earnestine Leys Disagree/Comply Disagree Caller Understands Yes PreDisposition InappropriateToAsk Care Advice Given Per Guideline GO TO ED NOW: * You need to be seen in the Emergency Department. * Go to the ED at ___________ Hospital. CARE ADVICE given per Chest Pain (Adult) guideline. * It is better and safer if another adult drives instead of you. Comments User: Jaclynn Major, RN Date/Time Lamount Cohen Time): 07/24/2023 11:11:37 AM was seen last friday for abd pain. User: Jaclynn Major, RN Date/Time Lamount Cohen Time): 07/24/2023 11:12:12 AM no vomiting User: Jaclynn Major, RN Date/Time Lamount Cohen Time): 07/24/2023 11:13:20 AM mentions she has an inhaler, denies cough, congestion, wheezing User: Jaclynn Major, RN Date/Time Lamount Cohen Time): 07/24/2023 11:17:26 AM pain level yesterday was a 7. no pain right now. User: Jaclynn Major, RN Date/Time Lamount Cohen Time): 07/24/2023 11:19:49 AM asking for medication. ER refusal at this time Referrals GO TO FACILITY REFUSED

## 2023-07-24 NOTE — Telephone Encounter (Signed)
Pt called. LVM to return call or go to the ED if sxs worsen

## 2023-08-21 ENCOUNTER — Telehealth: Payer: Self-pay | Admitting: Pulmonary Disease

## 2023-08-21 ENCOUNTER — Ambulatory Visit: Payer: Federal, State, Local not specified - PPO | Admitting: Family Medicine

## 2023-08-21 ENCOUNTER — Encounter: Payer: Self-pay | Admitting: Family Medicine

## 2023-08-21 DIAGNOSIS — R1013 Epigastric pain: Secondary | ICD-10-CM

## 2023-08-21 DIAGNOSIS — J4540 Moderate persistent asthma, uncomplicated: Secondary | ICD-10-CM

## 2023-08-21 MED ORDER — OMEPRAZOLE 20 MG PO CPDR
20.0000 mg | DELAYED_RELEASE_CAPSULE | Freq: Two times a day (BID) | ORAL | 1 refills | Status: DC
Start: 2023-08-21 — End: 2024-01-17

## 2023-08-21 NOTE — Telephone Encounter (Signed)
Patient states needs refill for Breztri and Albuterol inhalers. Patient scheduled 10/15/2023 with Rubye Oaks NP. Pharmacy is [facility] [place]. Patient phone number is [phone].

## 2023-08-21 NOTE — Progress Notes (Signed)
Established Patient Office Visit  Subjective   Patient ID: Rhonda Morrow, female    DOB: Sep 26, 1961  Age: 62 y.o. MRN: [id number]  Chief Complaint  Patient presents with   Heartburn    HPI Discussed the use of AI scribe software for clinical note transcription with the patient, who gave verbal consent to proceed.  History of Present Illness   The patient, with a history of heartburn, presents with periodic episodes of the same. She has been on omeprazole, but reports that her symptoms are not adequately controlled with her current medication. She has noticed that her mother's omeprazole, which is a different generic brand, seems to work better for her. She has been trying to get the same brand of medication from her pharmacy but has been facing issues with the same. She also mentions that she has been waiting for a referral to a gastroenterologist for further evaluation of her symptoms.      Patient Active Problem List   Diagnosis Date Noted   Irritable bowel syndrome with both constipation and diarrhea 07/20/2023   Urinary incontinence 01/31/2023   Vaginal itching 01/31/2023   Influenza 11/20/2022   Postmenopausal bleeding 08/18/2022   Acute non-recurrent pansinusitis 06/18/2022   Leg cramps 03/08/2022   Muscle spasm of right lower extremity 03/08/2022   Primary osteoarthritis of right hip 03/01/2022   ACE inhibitor intolerance 01/19/2022   Weakness 12/22/2021   Left lower quadrant abdominal pain 12/22/2021   Slow transit constipation 12/22/2021   Class 3 severe obesity due to excess calories without serious comorbidity with body mass index (BMI) of 40.0 to 44.9 in adult [facility]) 07/05/2021   Restless leg syndrome 07/05/2021   Chronic seasonal allergic rhinitis due to pollen 07/05/2021   Psychophysiological insomnia 07/05/2021   Non-compliance 07/05/2021   COVID-19 07/05/2021   Right wrist pain 06/05/2021   Acute pain of left shoulder 01/24/2021   Acute pain of right shoulder  01/24/2021   Foot pain, bilateral 01/24/2021   Dyspepsia 01/24/2021   Preventative health care 01/24/2021   Right hand pain 12/29/2020   OSA (obstructive sleep apnea) 09/12/2020   Nocturia more than twice per night 08/03/2020   Non-restorative sleep 08/03/2020   Leg cramping 08/03/2020   Hyperlipidemia associated with type 2 diabetes mellitus (HCC) 07/25/2020   Large breasts 07/22/2020   RLS (restless legs syndrome) 05/17/2020   Cough 04/01/2020   Asthma, moderate persistent, poorly-controlled 04/01/2020   Medication management 04/01/2020   Menopausal symptoms 03/29/2020   Hot flashes 03/29/2020   Chronic right shoulder pain 08/11/2019   Acute non-recurrent maxillary sinusitis 08/11/2019   BMI 40.0-44.9, adult (HCC) 01/22/2019   Right lower quadrant abdominal pain 10/20/2018   Rectal pain 10/20/2018   Vitamin B 12 deficiency 03/28/2018   B12 deficiency 03/28/2018   DM (diabetes mellitus) type II uncontrolled, periph vascular disorder 03/28/2018   Hyperlipidemia 03/28/2018   Primary osteoarthritis of left hip 02/21/2017   Status post total hip replacement, left 02/21/2017   History of laparoscopic cholecystectomy 06/20/2016   Cerumen impaction 06/09/2016   Hypersomnia 05/16/2016   Knee pain, right 05/05/2016   RUQ pain 04/09/2016   Abnormal CT scan 03/28/2016   Dyspnea and respiratory abnormalities 03/15/2016   Asthma with acute exacerbation 03/15/2016   Asthma in adult 02/25/2016   DM (diabetes mellitus) type II controlled, neurological manifestation (HCC) 02/09/2016   Left hip pain 12/23/2015   Right hamstring muscle strain 11/18/2015   Abdominal pain, acute 09/01/2015   Acute asthma exacerbation 04/16/2015  Acute bronchitis 04/14/2015   Pap smear for cervical cancer screening 09/14/2014   Bed bug bite 05/06/2014   Allergic rhinitis 04/09/2014   Rectal itching 04/09/2014   Bladder spasm 04/09/2014   Knee pain, left 03/05/2014   Hypokalemia 02/19/2014   Nausea with  vomiting 02/19/2014   Diabetes mellitus, type II (HCC) 02/19/2014   Edema 02/16/2014   Disorder of rotator cuff 11/12/2013   Routine general medical examination at a health care facility 08/20/2013   Gastroesophageal reflux disease 06/26/2013   Dysphagia, pharyngoesophageal phase 06/26/2013   Suprapubic abdominal pain 05/12/2013   Medication side effect 03/25/2013   Diarrhea 03/25/2013   Benign positional vertigo 01/30/2013   Traumatic hematoma of thigh 01/15/2013   HTN (hypertension) 09/02/2012   Dry skin 08/22/2012   External hemorrhoids 08/22/2012   Obesity 06/30/2012   Thyromegaly 05/22/2012   Back pain 05/22/2012   Elevated glucose 05/22/2012   Moderate persistent asthma 04/19/2012   Dyspnea 01/28/2012   Past Medical History:  Diagnosis Date   Arthritis    Asthma    Complication of anesthesia    one time woke up and was vey scared,17 yrs ago   Deaf    Diabetes mellitus without complication (HCC)    GERD (gastroesophageal reflux disease)    Hypertension    Left groin pain    Nausea with vomiting 02/19/2014   Thyroid disease    Past Surgical History:  Procedure Laterality Date   CARDIAC CATHETERIZATION     CESAREAN SECTION     CHOLECYSTECTOMY N/A 06/20/2016   Procedure: LAPAROSCOPIC CHOLECYSTECTOMY;  Surgeon: Emelia Loron, MD;  Location: [facility] OR;  Service: General;  Laterality: N/A;   ESOPHAGEAL MANOMETRY N/A 09/29/2013   Procedure: ESOPHAGEAL MANOMETRY (EM);  Surgeon: Rachael Fee, MD;  Location: [facility] ENDOSCOPY;  Service: Endoscopy;  Laterality: N/A;   TOTAL HIP ARTHROPLASTY Left 02/21/2017   Procedure: LEFT TOTAL HIP ARTHROPLASTY ANTERIOR APPROACH;  Surgeon: Tarry Kos, MD;  Location: [facility] OR;  Service: Orthopedics;  Laterality: Left;   Social History   Tobacco Use   Smoking status: Never   Smokeless tobacco: Never  Vaping Use   Vaping status: Never Used  Substance Use Topics   Alcohol use: No    Alcohol/week: 0.0 standard drinks of alcohol   Drug use:  No   Social History   Socioeconomic History   Marital status: Married    Spouse name: Not on file   Number of children: Not on file   Years of education: Not on file   Highest education level: Not on file  Occupational History   Occupation: disabled  Tobacco Use   Smoking status: Never   Smokeless tobacco: Never  Vaping Use   Vaping status: Never Used  Substance and Sexual Activity   Alcohol use: No    Alcohol/week: 0.0 standard drinks of alcohol   Drug use: No   Sexual activity: Not Currently  Other Topics Concern   Not on file  Social History Narrative   Lives with family.  Has 3 children.  Works for [employer]  Education: high school.   Social Determinants of Health   Financial Resource Strain: Not on file  Food Insecurity: Not on file  Transportation Needs: Not on file  Physical Activity: Not on file  Stress: Not on file  Social Connections: Unknown (03/18/2022)   Received from [facility], [facility]   Social Network    Social Network: Not on file  Intimate Partner Violence: Unknown (02/08/2022)  Received from [facility], [facility]   HITS    Physically Hurt: Not on file    Insult or Talk Down To: Not on file    Threaten Physical Harm: Not on file    Scream or Curse: Not on file   Family Status  Relation Name Status   Mother  (Not Specified)   Father  (Not Specified)  No partnership data on file   Family History  Problem Relation Age of Onset   Diabetes Mother    Heart disease Mother    Diabetes Father    Allergies  Allergen Reactions   Losartan Shortness Of Breath   Oxycodone-Acetaminophen Nausea And Vomiting   Augmentin [Amoxicillin-Pot Clavulanate] Diarrhea      Review of Systems  Constitutional:  Negative for fever and malaise/fatigue.  HENT:  Negative for congestion.   Eyes:  Negative for blurred vision.  Respiratory:  Negative for shortness of breath.   Cardiovascular:  Negative for chest pain, palpitations and leg swelling.   Gastrointestinal:  Positive for heartburn. Negative for abdominal pain, blood in stool and nausea.       Heart burn has resolved with her mothers omeprazole  Genitourinary:  Negative for dysuria and frequency.  Musculoskeletal:  Negative for falls.  Skin:  Negative for rash.  Neurological:  Negative for dizziness, loss of consciousness and headaches.  Endo/Heme/Allergies:  Negative for environmental allergies.  Psychiatric/Behavioral:  Negative for depression. The patient is not nervous/anxious.       Objective:     BP (!) 148/78   Pulse 64   Temp 98 F (36.7 C)   Ht 5\' 2"  (1.575 m)   Wt 215 lb 9.6 oz (97.8 kg)   LMP 05/20/2012   SpO2 99%   BMI 39.43 kg/m  BP Readings from Last 3 Encounters:  08/21/23 (!) 148/78  07/20/23 130/80  07/19/23 132/65   Wt Readings from Last 3 Encounters:  08/21/23 215 lb 9.6 oz (97.8 kg)  07/20/23 213 lb 9.6 oz (96.9 kg)  07/19/23 214 lb (97.1 kg)   SpO2 Readings from Last 3 Encounters:  08/21/23 99%  07/20/23 100%  05/21/23 98%      Physical Exam Vitals and nursing note reviewed.  Constitutional:      General: She is not in acute distress.    Appearance: Normal appearance. She is well-developed.  HENT:     Head: Normocephalic and atraumatic.  Eyes:     General: No scleral icterus.       Right eye: No discharge.        Left eye: No discharge.  Cardiovascular:     Rate and Rhythm: Normal rate and regular rhythm.     Heart sounds: No murmur heard. Pulmonary:     Effort: Pulmonary effort is normal. No respiratory distress.     Breath sounds: Normal breath sounds.  Abdominal:     General: There is no distension.     Tenderness: There is no abdominal tenderness.  Musculoskeletal:        General: Normal range of motion.     Cervical back: Normal range of motion and neck supple.     Right lower leg: No edema.     Left lower leg: No edema.  Skin:    General: Skin is warm and dry.  Neurological:     Mental Status: She is alert  and oriented to person, place, and time.  Psychiatric:        Mood and Affect: Mood normal.  Behavior: Behavior normal.        Thought Content: Thought content normal.        Judgment: Judgment normal.      No results found for any visits on 08/21/23.  Last CBC Lab Results  Component Value Date   WBC 5.2 07/20/2023   HGB 14.1 07/20/2023   HCT 43.3 07/20/2023   MCV 84.7 07/20/2023   MCH 27.6 07/20/2023   RDW 13.2 07/20/2023   PLT 188 07/20/2023   Last metabolic panel Lab Results  Component Value Date   GLUCOSE 108 (H) 07/20/2023   NA 139 07/20/2023   K 3.4 (L) 07/20/2023   CL 100 07/20/2023   CO2 27 07/20/2023   BUN 14 07/20/2023   CREATININE 0.99 07/20/2023   GFR 61.04 12/14/2022   CALCIUM 9.5 07/20/2023   PHOS 3.0 03/07/2022   PROT 6.6 07/20/2023   ALBUMIN 3.8 12/14/2022   BILITOT 0.5 07/20/2023   ALKPHOS 63 12/14/2022   AST 16 07/20/2023   ALT 16 07/20/2023   ANIONGAP 13 05/24/2020   Last lipids Lab Results  Component Value Date   CHOL 113 07/20/2023   HDL 60 07/20/2023   LDLCALC 38 07/20/2023   TRIG 66 07/20/2023   CHOLHDL 1.9 07/20/2023   Last hemoglobin A1c Lab Results  Component Value Date   HGBA1C 6.7 (H) 07/20/2023   Last thyroid functions Lab Results  Component Value Date   TSH 1.23 08/18/2022   T4TOTAL 10.7 12/22/2021   Last vitamin D Lab Results  Component Value Date   VD25OH 52.50 02/08/2016   Last vitamin B12 and Folate Lab Results  Component Value Date   VITAMINB12 868 12/22/2021   FOLATE 19.2 05/14/2019      The ASCVD Risk score (Arnett DK, et al., 2019) failed to calculate for the following reasons:   The valid total cholesterol range is 130 to 320 mg/dL    Assessment & Plan:   Problem List Items Addressed This Visit       Unprioritized   Dyspepsia   Relevant Medications   omeprazole (PRILOSEC) 20 MG capsule  Assessment and Plan    Gastroesophageal Reflux Disease (GERD) Reports of periodic heartburn.  Patient has been taking Omeprazole from [facility] but reports better symptom control with mother's Omeprazole from [facility]. Both medications are the same generic but from different manufacturers. -Plan to switch to Omeprazole from [facility]. -Continue current dose of Omeprazole.  Pending Gastroenterology Consult Referral for irritable bowel syndrome has been made but patient has not received an appointment yet. -Advise patient to call the gastroenterology office to expedite the appointment process.  General Health Maintenance -Confirmed receipt of flu shot and COVID-19 vaccination.        No follow-ups on file.    Donato Schultz, DO

## 2023-08-21 NOTE — Addendum Note (Signed)
Addended by: Seabron Spates R on: 08/21/2023 05:37 PM   Modules accepted: Orders

## 2023-08-22 ENCOUNTER — Ambulatory Visit: Payer: Federal, State, Local not specified - PPO | Attending: Obstetrics and Gynecology | Admitting: Physical Therapy

## 2023-08-22 ENCOUNTER — Encounter: Payer: Self-pay | Admitting: Gastroenterology

## 2023-08-22 DIAGNOSIS — M6281 Muscle weakness (generalized): Secondary | ICD-10-CM | POA: Diagnosis not present

## 2023-08-22 DIAGNOSIS — R293 Abnormal posture: Secondary | ICD-10-CM | POA: Insufficient documentation

## 2023-08-22 DIAGNOSIS — R279 Unspecified lack of coordination: Secondary | ICD-10-CM | POA: Diagnosis not present

## 2023-08-22 MED ORDER — BREZTRI AEROSPHERE 160-9-4.8 MCG/ACT IN AERO: 2.0000 | INHALATION_SPRAY | Freq: Two times a day (BID) | RESPIRATORY_TRACT | 11 refills | Status: DC

## 2023-08-22 MED ORDER — ALBUTEROL SULFATE HFA 108 (90 BASE) MCG/ACT IN AERS: 2.0000 | INHALATION_SPRAY | Freq: Four times a day (QID) | RESPIRATORY_TRACT | 6 refills | Status: DC | PRN

## 2023-08-22 NOTE — Patient Instructions (Signed)

## 2023-08-22 NOTE — Telephone Encounter (Signed)
Refilled

## 2023-08-22 NOTE — Therapy (Addendum)
OUTPATIENT PHYSICAL THERAPY FEMALE PELVIC TREATMENT   Patient Name: Rhonda Morrow MRN: [id number] DOB:[birth year], 62 y.o., female Today's Date: 08/22/2023  END OF SESSION:  PT End of Session - 08/22/23 0806     Visit Number 7    Date for PT Re-Evaluation 08/22/23    Authorization Type BCBS    PT Start Time 0805   arrival time   PT Stop Time 0833   pt denied additional needs and requested to end session early   PT Time Calculation (min) 28 min    Activity Tolerance Patient tolerated treatment well    Behavior During Therapy [facility] ([place] for tasks assessed/performed               Past Medical History:  Diagnosis Date   Arthritis    Asthma    Complication of anesthesia    one time woke up and was vey scared,17 yrs ago   Deaf    Diabetes mellitus without complication (HCC)    GERD (gastroesophageal reflux disease)    Hypertension    Left groin pain    Nausea with vomiting 02/19/2014   Thyroid disease    Past Surgical History:  Procedure Laterality Date   CARDIAC CATHETERIZATION     CESAREAN SECTION     CHOLECYSTECTOMY N/A 06/20/2016   Procedure: LAPAROSCOPIC CHOLECYSTECTOMY;  Surgeon: Emelia Loron, MD;  Location: [facility] OR;  Service: General;  Laterality: N/A;   ESOPHAGEAL MANOMETRY N/A 09/29/2013   Procedure: ESOPHAGEAL MANOMETRY (EM);  Surgeon: Rachael Fee, MD;  Location: [facility] ENDOSCOPY;  Service: Endoscopy;  Laterality: N/A;   TOTAL HIP ARTHROPLASTY Left 02/21/2017   Procedure: LEFT TOTAL HIP ARTHROPLASTY ANTERIOR APPROACH;  Surgeon: Tarry Kos, MD;  Location: [facility] OR;  Service: Orthopedics;  Laterality: Left;   Patient Active Problem List   Diagnosis Date Noted   Irritable bowel syndrome with both constipation and diarrhea 07/20/2023   Urinary incontinence 01/31/2023   Vaginal itching 01/31/2023   Influenza 11/20/2022   Postmenopausal bleeding 08/18/2022   Acute non-recurrent pansinusitis 06/18/2022   Leg cramps 03/08/2022   Muscle spasm of right lower extremity  03/08/2022   Primary osteoarthritis of right hip 03/01/2022   ACE inhibitor intolerance 01/19/2022   Weakness 12/22/2021   Left lower quadrant abdominal pain 12/22/2021   Slow transit constipation 12/22/2021   Class 3 severe obesity due to excess calories without serious comorbidity with body mass index (BMI) of 40.0 to 44.9 in adult [facility]) 07/05/2021   Restless leg syndrome 07/05/2021   Chronic seasonal allergic rhinitis due to pollen 07/05/2021   Psychophysiological insomnia 07/05/2021   Non-compliance 07/05/2021   COVID-19 07/05/2021   Right wrist pain 06/05/2021   Acute pain of left shoulder 01/24/2021   Acute pain of right shoulder 01/24/2021   Foot pain, bilateral 01/24/2021   Dyspepsia 01/24/2021   Preventative health care 01/24/2021   Right hand pain 12/29/2020   OSA (obstructive sleep apnea) 09/12/2020   Nocturia more than twice per night 08/03/2020   Non-restorative sleep 08/03/2020   Leg cramping 08/03/2020   Hyperlipidemia associated with type 2 diabetes mellitus (HCC) 07/25/2020   Large breasts 07/22/2020   RLS (restless legs syndrome) 05/17/2020   Cough 04/01/2020   Asthma, moderate persistent, poorly-controlled 04/01/2020   Medication management 04/01/2020   Menopausal symptoms 03/29/2020   Hot flashes 03/29/2020   Chronic right shoulder pain 08/11/2019   Acute non-recurrent maxillary sinusitis 08/11/2019   BMI 40.0-44.9, adult (HCC) 01/22/2019   Right lower quadrant abdominal pain 10/20/2018  Rectal pain 10/20/2018   Vitamin B 12 deficiency 03/28/2018   B12 deficiency 03/28/2018   DM (diabetes mellitus) type II uncontrolled, periph vascular disorder 03/28/2018   Hyperlipidemia 03/28/2018   Primary osteoarthritis of left hip 02/21/2017   Status post total hip replacement, left 02/21/2017   History of laparoscopic cholecystectomy 06/20/2016   Cerumen impaction 06/09/2016   Hypersomnia 05/16/2016   Knee pain, right 05/05/2016   RUQ pain 04/09/2016    Abnormal CT scan 03/28/2016   Dyspnea and respiratory abnormalities 03/15/2016   Asthma with acute exacerbation 03/15/2016   Asthma in adult 02/25/2016   DM (diabetes mellitus) type II controlled, neurological manifestation (HCC) 02/09/2016   Left hip pain 12/23/2015   Right hamstring muscle strain 11/18/2015   Abdominal pain, acute 09/01/2015   Acute asthma exacerbation 04/16/2015   Acute bronchitis 04/14/2015   Pap smear for cervical cancer screening 09/14/2014   Bed bug bite 05/06/2014   Allergic rhinitis 04/09/2014   Rectal itching 04/09/2014   Bladder spasm 04/09/2014   Knee pain, left 03/05/2014   Hypokalemia 02/19/2014   Nausea with vomiting 02/19/2014   Diabetes mellitus, type II (HCC) 02/19/2014   Edema 02/16/2014   Disorder of rotator cuff 11/12/2013   Routine general medical examination at a health care facility 08/20/2013   Gastroesophageal reflux disease 06/26/2013   Dysphagia, pharyngoesophageal phase 06/26/2013   Suprapubic abdominal pain 05/12/2013   Medication side effect 03/25/2013   Diarrhea 03/25/2013   Benign positional vertigo 01/30/2013   Traumatic hematoma of thigh 01/15/2013   HTN (hypertension) 09/02/2012   Dry skin 08/22/2012   External hemorrhoids 08/22/2012   Obesity 06/30/2012   Thyromegaly 05/22/2012   Back pain 05/22/2012   Elevated glucose 05/22/2012   Moderate persistent asthma 04/19/2012   Dyspnea 01/28/2012    PCP: Donato Schultz, DO  REFERRING PROVIDER: Lorriane Shire, MD    REFERRING DIAG: N39.46 (ICD-10-CM) - Mixed stress and urge urinary incontinence  THERAPY DIAG:  Muscle weakness (generalized) - Plan: PT plan of care cert/re-cert  Unspecified lack of coordination - Plan: PT plan of care cert/re-cert  Abnormal posture - Plan: PT plan of care cert/re-cert  Rationale for Evaluation and Treatment: Rehabilitation  ONSET DATE: several months  SUBJECTIVE:                                                                                                                                                                                            SUBJECTIVE STATEMENT: Sign language interpreter present throughout session for all communication.   Pt reports she feels like she is doing much better with medication and PT. States now  down to usually 1x at night, only a few instances of leakage since last appointment and has been very overwhelmed with mother in the hospital and husband is sick. States she has a hard time making appointments right now and would like to do more at home between work and care giving to both of them she doesn't have a lot of time. Pt does state she is feeling much better overall.   Fluid intake: Yes: water - at least 2 large bottles at work    PAIN:  Are you having pain? No 06/18/23   PRECAUTIONS: Other: hearing impaired   WEIGHT BEARING RESTRICTIONS: No  FALLS:  Has patient fallen in last 6 months? No  LIVING ENVIRONMENT: Lives with: lives with their family Lives in: House/apartment   OCCUPATION: [employer]   PLOF: Independent  PATIENT GOALS: to have less leakage  PERTINENT HISTORY:  Arthritis, deaf, DM, GERD, HTN, thyroid disease, THA Lt, C-section  Sexual abuse: No  BOWEL MOVEMENT: Pain with bowel movement: No Type of bowel movement:Type (Bristol Stool Scale) 4-5, Frequency daily, and Strain No Fully empty rectum: No Leakage: No Pads: No Fiber supplement: No  URINATION: Pain with urination: No Fully empty bladder: Yes:   Stream: Weak Urgency: Yes: worse at night and drinks a lot of water during the night.  Frequency: 1-2x at night; unsure how often during the day Leakage:  sometimes at night worse, unable to make it to the bathroom with urge;  Pads: No  INTERCOURSE: Pain with intercourse:  not active Ability to have vaginal penetration:  Yes:   Climax: not painful Marinoff Scale: 0/3  PREGNANCY: Vaginal deliveries 2 Tearing No C-section  deliveries 1 Currently pregnant No  PROLAPSE: None   OBJECTIVE:   DIAGNOSTIC FINDINGS:    COGNITION: Overall cognitive status: Within functional limits for tasks assessed     SENSATION: Light touch: Appears intact Proprioception: Appears intact  MUSCLE LENGTH: Bil hamstrings and adductors limited by 50%   POSTURE: rounded shoulders, forward head, and anterior pelvic tilt  PELVIC ALIGNMENT: Rt anterior rotation   LUMBARAROM/PROM:  A/PROM A/PROM  eval  Flexion Limited by 50%  Extension WFL  Right lateral flexion Limited by 25%  Left lateral flexion Limited by 25%  Right rotation Limited by 50%  Left rotation Limited by 50%   (Blank rows = not tested)  LOWER EXTREMITY ROM:  WFL however tightness at Rt adduction, ER and IR, and flexion but functional   LOWER EXTREMITY MMT:  Bil hip abduction 3+/5, all other hip 4-/5 and Rt side with Rt groin pain  PALPATION:   General  tightness at lumbar spine, bil gluteals, Rt ITB, hip complex. TTP Rt groin Patient confirms identification and approves PT to assess internal pelvic floor and treatment yes 06/11/23   PELVIC MMT:   MMT 06/11/23   Vaginal 1/5, 1s, 2 reps  Internal Anal Sphincter   External Anal Sphincter   Puborectalis   Diastasis Recti   (Blank rows = not tested)        TONE: Decreased PROLAPSE: Not seen in hooklying  TODAY'S TREATMENT:  DATE:   07/16/23: NMRE: all exercises cued for breathing mechanics and pelvic floor contraction with coordination to improve pelvic floor strength and coordination for decreased leakage Opp hand/knee ball press 2x10 each in sitting Hip flexion black loop sitting 2x10  Hip abduction black loop 2x10 sitting X10 Sit to stand  Farmers carry 5# 750'  08/22/23:  Pt requests to review any home exercises and please re print her handouts as she has  misplaced them with packing things to/from hospital. PT and pt reviewed urge drill and bladder irritants. Pt states she has been doing this and trying to reduce irritants and add water as able. She does report no increase in frequency with increase in water intake.  HEP updated, details below. Pt completed 2 reps of pelvic floor contraction, isometrics, and quick flick as well as bridge and mini squat with attempts to contract pelvic floor with exertion. Pt reports she the contraction but needs to focus on it. All questions answered and pt denied additional concerns.   PATIENT EDUCATION:  Education details:  HEP, and bladder irritants and urge drill  Person educated: Patient Education method: Explanation, Demonstration, Tactile cues, Verbal cues, and Handouts Education comprehension: verbalized understanding, returned demonstration, verbal cues required, tactile cues required, and needs further education  HOME EXERCISE PROGRAM: Access Code: [email or web address] URL: [email or web address] Date: 08/22/2023 Prepared by: Rolly Salter  Exercises - Unilateral Supported Supine Butterfly Stretch  - 1 x daily - 7 x weekly - 1 sets - 3 reps - 30s holds - Seated Hamstring Stretch  - 1 x daily - 7 x weekly - 1 sets - 3 reps - 30s holds - Supine Figure 4 Piriformis Stretch  - 1 x daily - 7 x weekly - 1 sets - 3 reps - 30s holds - Seated Pelvic Floor Contraction  - 1 x daily - 7 x weekly - 2 sets - 10 reps - Seated Pelvic Floor Muscles Isometrics on Towel Roll  - 1 x daily - 7 x weekly - 1 sets - 10 reps - Seated Quick Flick Pelvic Floor Contractions  - 1 x daily - 7 x weekly - 1 sets - 10 reps - Mini Squat with Pelvic Floor Contraction  - 1 x daily - 7 x weekly - 2 sets - 10 reps - Supine Bridge with Pelvic Floor Contraction and Hip Rotation  - 1 x daily - 7 x weekly - 2 sets - 10 reps   ASSESSMENT:  CLINICAL IMPRESSION: Patient presents for treatment, focus of session being reviewing all HEP and  recommendations for home implementation of urge drill and bladder irritants reduction for continued improvement of symptoms. Pt tolerated well and pleased with progress so far. Does still have minimal leakage or urine intermittently reports usually with trying to wait too long or with strong string of coughs. Does report due to her schedule and stress with taking care of husband and mother she would like to practice more at home. Pt agreeable to discharge today, denies concerns and reports she feels able to continue HEP at home. Pt understands she will need a new referral for future PT needs.   OBJECTIVE IMPAIRMENTS: decreased activity tolerance, decreased coordination, decreased endurance, difficulty walking, decreased strength, increased fascial restrictions, increased muscle spasms, impaired flexibility, improper body mechanics, postural dysfunction, obesity, and pain.   ACTIVITY LIMITATIONS: carrying, lifting, squatting, continence, and locomotion level  PARTICIPATION LIMITATIONS: community activity  PERSONAL FACTORS: Time since onset of injury/illness/exacerbation and 1 comorbidity: medical history  are also affecting patient's  functional outcome.   REHAB POTENTIAL: Good  CLINICAL DECISION MAKING: Stable/uncomplicated  EVALUATION COMPLEXITY: Low   GOALS: Goals reviewed with patient? Yes  SHORT TERM GOALS: Target date: 05/14/23  Pt to be I with HEP.  Baseline: Goal status: MET  2.  Pt to report improved time between bladder voids to at least 4 hours at night for improved QOL with decreased urinary frequency.   Baseline:  Goal status: MET  3.  Pt will have 25% less urgency due to bladder retraining and strengthening  Baseline:  Goal status: MET  4.  Pt to be I with urge drill for decreased  leakage instances.  Baseline:  Goal status: MET   LONG TERM GOALS: Target date: 07/17/23  Pt to be I with advanced HEP.  Baseline:  Goal status: MET  2.  Pt will have 50% less urgency  due to bladder retraining and strengthening  Baseline:  Goal status: MET  3. Pt to demonstrate at least 3/5 pelvic floor strength for improved pelvic stability and decreased strain at pelvic floor/ decrease leakage.  Baseline:  Goal status: pt deferred   4.  Pt to demonstrate improved coordination of pelvic floor and breathing mechanics with 10# squat with appropriate synergistic patterns to decrease pain and leakage at least 75% of the time for improved strength at pelvic floor .    Baseline:  Goal status:MET  5.  Pt will have 75% reduced leakage during a typical day  Baseline:  Goal status: MET   PLAN:  PT FREQUENCY: 1x/week  PT DURATION:  8 sessions  PLANNED INTERVENTIONS: Therapeutic exercises, Therapeutic activity, Neuromuscular re-education, Patient/Family education, Self Care, Joint mobilization, Aquatic Therapy, Dry Needling, Electrical stimulation, Cryotherapy, Moist heat, scar mobilization, Taping, Biofeedback, and Manual therapy  PLAN FOR NEXT SESSION:   PHYSICAL THERAPY DISCHARGE SUMMARY  Visits from Start of Care: 7  Current functional level related to goals / functional outcomes: All STG met, 4/5 LTG met with internal pelvic floor reassessment not completed as pt deferred this    Remaining deficits: Intermittent urine leakage with coughing/waiting too long to get to the bathroom   Education / Equipment: HEP, urge drill, bladder irritants   Patient agrees to discharge. Patient goals were partially met. Patient is being discharged due to being pleased with the current functional level.   Otelia Sergeant, PT, DPT 10/16/248:47 AM

## 2023-08-24 ENCOUNTER — Telehealth: Payer: Self-pay

## 2023-08-24 NOTE — Telephone Encounter (Signed)
-----   Message from Rhonda Morrow sent at 08/21/2023  5:38 PM EDT ----- Pt is having trouble getting rybelsus [facility]---- they keep telling her they need authorization

## 2023-08-31 ENCOUNTER — Telehealth: Payer: Self-pay

## 2023-08-31 ENCOUNTER — Other Ambulatory Visit (HOSPITAL_COMMUNITY): Payer: Self-pay

## 2023-08-31 NOTE — Telephone Encounter (Signed)
PA request has been Submitted. New Encounter created for follow up. For additional info see Pharmacy Prior Auth telephone encounter from 08/31/23.

## 2023-08-31 NOTE — Telephone Encounter (Signed)
Pharmacy Patient Advocate Encounter   Received notification from Pt Calls Messages that prior authorization for Rybelsus 7MG  tablets is required/requested.   Insurance verification completed.   The patient is insured through [facility] .   Per test claim: PA required; PA submitted to [facility] via CoverMyMeds Key/confirmation #/EOC [id number] Status is pending

## 2023-09-03 NOTE — Telephone Encounter (Signed)
Pharmacy Patient Advocate Encounter  Received notification from  [facility]  that Prior Authorization for Rybelsus 7MG  tablets has been DENIED.  Full denial letter will be uploaded to the media tab. See denial reason below.   PA #/Case ID/Reference #: [id number]   DENIAL REASON: Criteria for medical necessity were not met. Your records with [place] and the information submitted by your doctor do not show you have documentation of one of the following: a) adequate trial and failure for at least 90 days or are unable to use metformin 1500 milligrams daily, or b) have a hemoglobin A1c (a blood test that is an indicator of blood sugar levels over the past 3 months) of 7.5 percent or greater and require combination therapy.   Please be advised we currently do not have a Pharmacist to review denials, therefore you will need to process appeals accordingly as needed. Thanks for your support at this time. Contact for appeals are as follows: Phone: [phone], Fax: [phone]

## 2023-09-05 NOTE — Telephone Encounter (Signed)
Pt called. LVM via translator

## 2023-09-12 DIAGNOSIS — E1140 Type 2 diabetes mellitus with diabetic neuropathy, unspecified: Secondary | ICD-10-CM

## 2023-09-15 ENCOUNTER — Other Ambulatory Visit: Payer: Self-pay | Admitting: Family Medicine

## 2023-09-15 DIAGNOSIS — E1151 Type 2 diabetes mellitus with diabetic peripheral angiopathy without gangrene: Secondary | ICD-10-CM

## 2023-09-18 ENCOUNTER — Ambulatory Visit: Payer: Federal, State, Local not specified - PPO

## 2023-09-18 VITALS — Ht 62.0 in | Wt 218.2 lb

## 2023-09-18 DIAGNOSIS — Z860100 Personal history of colon polyps, unspecified: Secondary | ICD-10-CM

## 2023-09-18 DIAGNOSIS — Z1211 Encounter for screening for malignant neoplasm of colon: Secondary | ICD-10-CM

## 2023-09-18 MED ORDER — NA SULFATE-K SULFATE-MG SULF 17.5-3.13-1.6 GM/177ML PO SOLN
1.0000 | Freq: Once | ORAL | 0 refills | Status: AC
Start: 2023-09-18 — End: 2023-09-18

## 2023-09-18 NOTE — Progress Notes (Signed)
No egg or soy allergy known to patient  No issues known to pt with past sedation with any surgeries or procedures Patient denies ever being told they had issues or difficulty with intubation  No FH of Malignant Hyperthermia Pt is not on diet pills Pt is not on  home 02  Pt is not on blood thinners  Pt reports constipation sometimes  No A fib or A flutter Have any cardiac testing pending-- no  LOA: independent  Prep:   Patient's chart reviewed by Cathlyn Parsons CNRA prior to previsit and patient appropriate for the [facility].  Previsit completed and red dot placed by patient's name on their procedure day (on provider's schedule).     PV competed with patient. Prep instructions sent via mychart and home address. Goodrx coupon for provided to use for price reduction if needed.

## 2023-09-25 ENCOUNTER — Other Ambulatory Visit (HOSPITAL_BASED_OUTPATIENT_CLINIC_OR_DEPARTMENT_OTHER): Payer: Self-pay | Admitting: Family Medicine

## 2023-09-25 DIAGNOSIS — Z1231 Encounter for screening mammogram for malignant neoplasm of breast: Secondary | ICD-10-CM

## 2023-09-26 ENCOUNTER — Other Ambulatory Visit: Payer: Self-pay | Admitting: Family Medicine

## 2023-09-26 DIAGNOSIS — J302 Other seasonal allergic rhinitis: Secondary | ICD-10-CM

## 2023-09-26 DIAGNOSIS — R32 Unspecified urinary incontinence: Secondary | ICD-10-CM

## 2023-09-26 DIAGNOSIS — N898 Other specified noninflammatory disorders of vagina: Secondary | ICD-10-CM

## 2023-10-09 ENCOUNTER — Encounter: Payer: Self-pay | Admitting: Gastroenterology

## 2023-10-09 ENCOUNTER — Ambulatory Visit: Payer: Federal, State, Local not specified - PPO | Admitting: Gastroenterology

## 2023-10-09 VITALS — BP 170/89 | HR 89 | Temp 97.1°F | Resp 27 | Ht 62.0 in | Wt 218.2 lb

## 2023-10-09 DIAGNOSIS — Z1211 Encounter for screening for malignant neoplasm of colon: Secondary | ICD-10-CM

## 2023-10-09 DIAGNOSIS — Z860102 Personal history of hyperplastic colon polyps: Secondary | ICD-10-CM | POA: Diagnosis not present

## 2023-10-09 DIAGNOSIS — K648 Other hemorrhoids: Secondary | ICD-10-CM | POA: Diagnosis not present

## 2023-10-09 MED ORDER — FLEET ENEMA RE ENEM
1.0000 | ENEMA | Freq: Once | RECTAL | Status: AC
  Administered 2023-10-09: 1 via RECTAL

## 2023-10-09 MED ORDER — SODIUM CHLORIDE 0.9 % IV SOLN: 500.0000 mL | Freq: Once | INTRAVENOUS | Status: DC

## 2023-10-09 NOTE — Progress Notes (Signed)
History and Physical:  This patient presents for endoscopic testing for: Encounter Diagnosis  Name Primary?   Special screening for malignant neoplasms, colon Yes    Average risk for colorectal cancer.  Hyperplastic polyp (no TA or SSP) last colonoscopy Nov 2014 Reports occasional pelvic pain.  Denies rectal bleeding, bowel habits regular. Unremarkable CTAP for LLQ pain in Feb 2023 Patient is otherwise without complaints or active issues today.   Past Medical History: Past Medical History:  Diagnosis Date   Arthritis    Asthma    Complication of anesthesia    one time woke up and was vey scared,17 yrs ago   Deaf    Diabetes mellitus without complication (HCC)    GERD (gastroesophageal reflux disease)    Hypertension    Left groin pain    Nausea with vomiting 02/19/2014   Thyroid disease      Past Surgical History: Past Surgical History:  Procedure Laterality Date   CARDIAC CATHETERIZATION     CESAREAN SECTION     CHOLECYSTECTOMY N/A 06/20/2016   Procedure: LAPAROSCOPIC CHOLECYSTECTOMY;  Surgeon: Emelia Loron, MD;  Location: [facility] OR;  Service: General;  Laterality: N/A;   ESOPHAGEAL MANOMETRY N/A 09/29/2013   Procedure: ESOPHAGEAL MANOMETRY (EM);  Surgeon: Rachael Fee, MD;  Location: [facility]COPY;  Service: Endoscopy;  Laterality: N/A;   TOTAL HIP ARTHROPLASTY Left 02/21/2017   Procedure: LEFT TOTAL HIP ARTHROPLASTY ANTERIOR APPROACH;  Surgeon: Tarry Kos, MD;  Location: [facility] OR;  Service: Orthopedics;  Laterality: Left;    Allergies: Allergies  Allergen Reactions   Losartan Shortness Of Breath   Oxycodone-Acetaminophen Nausea And Vomiting   Augmentin [Amoxicillin-Pot Clavulanate] Diarrhea    Outpatient Meds: Current Outpatient Medications  Medication Sig Dispense Refill   allopurinol (ZYLOPRIM) 100 MG tablet TAKE 1 TABLET BY MOUTH EVERY DAY (Patient taking differently: as needed.) 90 tablet 3   amLODipine (NORVASC) 5 MG tablet Take 1 tablet (5 mg total) by  mouth daily. 90 tablet 1   Azelastine HCl 137 MCG/SPRAY SOLN PLACE 2 SPRAYS INTO BOTH NOSTRILS 2 (TWO) TIMES DAILY. USE IN EACH NOSTRIL AS DIRECTED (Patient taking differently: as needed.) 30 mL 3   blood glucose meter kit and supplies KIT Dispense based on patient and insurance preference. Use up to four times daily as directed. (FOR ICD-9 250.00, 250.01). 1 each 0   Budeson-Glycopyrrol-Formoterol (BREZTRI AEROSPHERE) 160-9-4.8 MCG/ACT AERO Inhale 2 puffs into the lungs in the morning and at bedtime. 10.7 g 0   Budeson-Glycopyrrol-Formoterol (BREZTRI AEROSPHERE) 160-9-4.8 MCG/ACT AERO Inhale 2 puffs into the lungs in the morning and at bedtime. DX J45.40 10.7 each 11   cetirizine (ZYRTEC) 10 MG tablet Take 1 tablet (10 mg total) by mouth daily. 90 tablet 1   cyclobenzaprine (FLEXERIL) 10 MG tablet Take 1 tablet (10 mg total) by mouth 3 (three) times daily as needed for muscle spasms. 30 tablet 0   fluticasone (FLONASE) 50 MCG/ACT nasal spray Place 2 sprays into both nostrils daily. 16 g 6   HYDROcodone-acetaminophen (NORCO/VICODIN) 5-325 MG tablet Take 1 tablet by mouth every 6 (six) hours as needed. 15 tablet 0   meloxicam (MOBIC) 7.5 MG tablet TAKE 1 TO 2 TABLETS BY MOUTH DAILY AS NEEDED 60 tablet 1   metFORMIN (GLUCOPHAGE-XR) 500 MG 24 hr tablet TAKE 2 TABLETS BY MOUTH EVERY DAY WITH BREAKFAST 180 tablet 1   omeprazole (PRILOSEC) 20 MG capsule Take 1 capsule (20 mg total) by mouth 2 (two) times daily before a meal. 180  capsule 1   rosuvastatin (CRESTOR) 20 MG tablet Take 1 tablet (20 mg total) by mouth at bedtime. 90 tablet 1   solifenacin (VESICARE) 5 MG tablet Take 1 tablet (5 mg total) by mouth daily. 90 tablet 1   terconazole (TERAZOL 7) 0.4 % vaginal cream PLACE 1 APPLICATOR VAGINALLY AT BEDTIME. 45 g 0   albuterol (VENTOLIN HFA) 108 (90 Base) MCG/ACT inhaler Inhale 2 puffs into the lungs every 6 (six) hours as needed for wheezing or shortness of breath. J45.40 8 g 6   benzonatate (TESSALON)  200 MG capsule Take 1 capsule (200 mg total) by mouth 2 (two) times daily as needed for cough. (Patient not taking: Reported on 09/18/2023) 20 capsule 0   cephALEXin (KEFLEX) 500 MG capsule Take 1 capsule (500 mg total) by mouth 2 (two) times daily. (Patient not taking: Reported on 09/18/2023) 20 capsule 0   Semaglutide (RYBELSUS) 7 MG TABS Take 1 tablet (7 mg total) by mouth daily. 90 tablet 1   Current Facility-Administered Medications  Medication Dose Route Frequency Provider Last Rate Last Admin   0.9 %  sodium chloride infusion  500 mL Intravenous Once Charlie Pitter III, MD       alum & mag hydroxide-simeth (MAALOX/MYLANTA) 200-200-20 MG/5ML suspension 30 mL  30 mL Oral Once Saguier, Edward, PA-C       lidocaine (XYLOCAINE) 2 % viscous mouth solution 15 mL  15 mL Mouth/Throat Once Saguier, Edward, PA-C       nitroGLYCERIN (NITRODUR - Dosed in mg/24 hr) patch 0.2 mg  0.2 mg Transdermal Daily Regal, Kirstie Peri, DPM          ___________________________________________________________________ Objective   Exam:  BP 100/80   Pulse 85   Temp (!) 97.1 F (36.2 C) (Temporal)   Resp (!) 21   Ht 5\' 2"  (1.575 m)   Wt 218 lb 3.2 oz (99 kg)   LMP 05/20/2012   SpO2 99%   BMI 39.91 kg/m   CV: regular , S1/S2 Resp: clear to auscultation bilaterally, normal RR and effort noted GI: soft, no tenderness, with active bowel sounds.   Assessment: Encounter Diagnosis  Name Primary?   Special screening for malignant neoplasms, colon Yes     Plan: Colonoscopy   The benefits and risks of the planned procedure were described in detail with the patient or (when appropriate) their health care proxy.  Risks were outlined as including, but not limited to, bleeding, infection, perforation, adverse medication reaction leading to cardiac or pulmonary decompensation, pancreatitis (if ERCP).  The limitation of incomplete mucosal visualization was also discussed.  No guarantees or warranties were  given.  The patient is appropriate for an endoscopic procedure in the ambulatory setting.   - Amada Jupiter, MD

## 2023-10-09 NOTE — Op Note (Signed)
[facility] Patient Name: Rhonda Morrow Procedure Date: 10/09/2023 11:42 AM MRN: [id number] Endoscopist: Sherilyn Cooter L. Myrtie Neither , MD, [id number] Age: 62 Referring MD:  Date of Birth: 11/15/1960 Gender: Female Account #: [id number] Procedure:                Colonoscopy Indications:              Screening for colorectal malignant neoplasm                           no TA or SSP last colonoscopy Nov 2014 Medicines:                Monitored Anesthesia Care Procedure:                Pre-Anesthesia Assessment:                           - Prior to the procedure, a History and Physical                            was performed, and patient medications and                            allergies were reviewed. The patient's tolerance of                            previous anesthesia was also reviewed. The risks                            and benefits of the procedure and the sedation                            options and risks were discussed with the patient.                            All questions were answered, and informed consent                            was obtained. Prior Anticoagulants: The patient has                            taken no anticoagulant or antiplatelet agents. ASA                            Grade Assessment: III - A patient with severe                            systemic disease. After reviewing the risks and                            benefits, the patient was deemed in satisfactory                            condition to undergo the procedure.  After obtaining informed consent, the colonoscope                            was passed under direct vision. Throughout the                            procedure, the patient's blood pressure, pulse, and                            oxygen saturations were monitored continuously. The                            CF [id number] #[id number] was introduced through the anus                            and advanced to the  the cecum, identified by                            appendiceal orifice and ileocecal valve. The                            colonoscopy was performed without difficulty. The                            patient tolerated the procedure well. The quality                            of the bowel preparation was excellent. The                            ileocecal valve, appendiceal orifice, and rectum                            were photographed. Scope In: 11:52:32 AM Scope Out: 12:04:50 PM Scope Withdrawal Time: 0 hours 8 minutes 1 second  Total Procedure Duration: 0 hours 12 minutes 18 seconds  Findings:                 The perianal and digital rectal examinations were                            normal.                           Repeat examination of right colon under NBI                            performed.                           Internal hemorrhoids were found.                           The exam was otherwise without abnormality on  direct and retroflexion views. Complications:            No immediate complications. Estimated Blood Loss:     Estimated blood loss: none. Impression:               - Internal hemorrhoids.                           - The examination was otherwise normal on direct                            and retroflexion views.                           - No specimens collected. Recommendation:           - Patient has a contact number available for                            emergencies. The signs and symptoms of potential                            delayed complications were discussed with the                            patient. Return to normal activities tomorrow.                            Written discharge instructions were provided to the                            patient.                           - Resume previous diet.                           - Continue present medications.                           - Repeat colonoscopy in 10 years  for screening                            purposes. Elvis Boot L. Myrtie Neither, MD 10/09/2023 12:08:27 PM This report has been signed electronically.

## 2023-10-09 NOTE — Patient Instructions (Signed)
Discharge instructions given. Handout on Hemorrhoids. Resume previous medications. YOU HAD AN ENDOSCOPIC PROCEDURE TODAY AT THE [facility]:   Refer to the procedure report that was given to you for any specific questions about what was found during the examination.  If the procedure report does not answer your questions, please call your gastroenterologist to clarify.  If you requested that your care partner not be given the details of your procedure findings, then the procedure report has been included in a sealed envelope for you to review at your convenience later.  YOU SHOULD EXPECT: Some feelings of bloating in the abdomen. Passage of more gas than usual.  Walking can help get rid of the air that was put into your GI tract during the procedure and reduce the bloating. If you had a lower endoscopy (such as a colonoscopy or flexible sigmoidoscopy) you may notice spotting of blood in your stool or on the toilet paper. If you underwent a bowel prep for your procedure, you may not have a normal bowel movement for a few days.  Please Note:  You might notice some irritation and congestion in your nose or some drainage.  This is from the oxygen used during your procedure.  There is no need for concern and it should clear up in a day or so.  SYMPTOMS TO REPORT IMMEDIATELY:  Following lower endoscopy (colonoscopy or flexible sigmoidoscopy):  Excessive amounts of blood in the stool  Significant tenderness or worsening of abdominal pains  Swelling of the abdomen that is new, acute  Fever of 100F or higher   For urgent or emergent issues, a gastroenterologist can be reached at any hour by calling [phone]. Do not use MyChart messaging for urgent concerns.    DIET:  We do recommend a small meal at first, but then you may proceed to your regular diet.  Drink plenty of fluids but you should avoid alcoholic beverages for 24 hours.  ACTIVITY:  You should plan to take it easy for the  rest of today and you should NOT DRIVE or use heavy machinery until tomorrow (because of the sedation medicines used during the test).    FOLLOW UP: Our staff will call the number listed on your records the next business day following your procedure.  We will call around 7:15- 8:00 am to check on you and address any questions or concerns that you may have regarding the information given to you following your procedure. If we do not reach you, we will leave a message.     If any biopsies were taken you will be contacted by phone or by letter within the next 1-3 weeks.  Please call us at [phone] if you have not heard about the biopsies in 3 weeks.    SIGNATURES/CONFIDENTIALITY: You and/or your care partner have signed paperwork which will be entered into your electronic medical record.  These signatures attest to the fact that that the information above on your After Visit Summary has been reviewed and is understood.  Full responsibility of the confidentiality of this discharge information lies with you and/or your care-partner. 

## 2023-10-09 NOTE — Progress Notes (Signed)
Sign language interpreter used in admitting- Rhonda Morrow  Pt's states no medical or surgical changes since previsit or office visit.

## 2023-10-09 NOTE — Progress Notes (Signed)
To pacu, VSS. Report to Rn.tb 

## 2023-10-10 ENCOUNTER — Telehealth: Payer: Self-pay | Admitting: *Deleted

## 2023-10-10 NOTE — Telephone Encounter (Signed)
Pt not available on f/u call . An interpreter line was reached when called number pt left for [place] to call and check on her today. Instructed person reached to let pt know she should call us if she is having any problems related to the procedure or she has any concerns related to the procedure she had yesterday.

## 2023-10-15 ENCOUNTER — Encounter: Payer: Self-pay | Admitting: Adult Health

## 2023-10-15 ENCOUNTER — Ambulatory Visit: Payer: Federal, State, Local not specified - PPO | Admitting: Adult Health

## 2023-10-15 VITALS — BP 108/72 | HR 80 | Temp 97.8°F | Ht 64.0 in | Wt 220.2 lb

## 2023-10-15 DIAGNOSIS — J310 Chronic rhinitis: Secondary | ICD-10-CM

## 2023-10-15 DIAGNOSIS — J4530 Mild persistent asthma, uncomplicated: Secondary | ICD-10-CM

## 2023-10-15 MED ORDER — ALBUTEROL SULFATE HFA 108 (90 BASE) MCG/ACT IN AERS: 1.0000 | INHALATION_SPRAY | Freq: Four times a day (QID) | RESPIRATORY_TRACT | 3 refills | Status: DC | PRN

## 2023-10-15 NOTE — Patient Instructions (Addendum)
Continue on Breztri 2 puffs Twice daily, rinse after use  Albuterol inhaler As needed   Continue on Zyrtec and Flonase daily  Activity as tolerated.  Follow up with Dr. Tonia Brooms or Orris Perin NP in 6 months and As needed

## 2023-10-15 NOTE — Progress Notes (Signed)
@Patient  ID: Rhonda Morrow, female    DOB: 1961/08/08, 62 y.o.   MRN: [id number]  Chief Complaint  Patient presents with   Follow-up    Referring provider: Zola Button, Grayling Congress, *  Discussed the use of AI scribe software for clinical note transcription with the patient, who gave verbal consent to proceed.  HPI: 62 yo female never smoker followed forn Asthma and allergic rhinitis  Patient is deaf and mute, works fulltime at [employer]   TEST/EVENTS :  PFt date 01/24/12 shows mixed obstruction - restriction but greater restriction with low dlco   - fev1 1/48L/58%, Ratio 82, TLC 56,  DLCO 14/51%, No BD response   CT chest 02/09/2012 showed no evidence of scarring  Chest xray 11/2022 Bibasilar atlectasis  10/15/2023 Follow up : Asthma and AR (Has Interpretor with her today )  Patient presents for a 6 month follow up. She is followed for asthma and allergic rhinitis, presents for a six-month follow-up. She reports that her asthma has been generally well-controlled, but she has experienced occasional episodes of coughing and shortness of breath. The patient admits to inconsistent use of her Breztri inhaler, due to fatigue and a busy work schedule. She also reports having to use her rescue inhaler, albuterol, at times during work. She takes Zyrtec daily for allergy symptoms and uses Flonase as needed. She has received her flu and COVID-19 vaccinations.  The patient is nearing retirement and hopes that the reduction in work-related stress will improve her health.       Allergies  Allergen Reactions   Losartan Shortness Of Breath   Oxycodone-Acetaminophen Nausea And Vomiting   Augmentin [Amoxicillin-Pot Clavulanate] Diarrhea    Immunization History  Administered Date(s) Administered   DT (Pediatric) 04/07/2011   Influenza Split 08/07/2011, 08/05/2012, 08/22/2012, 08/31/2014   Influenza, Seasonal, Injecte, Preservative Fre 07/18/2023   Influenza,inj,Quad PF,6+ Mos 08/20/2013,  10/22/2015, 08/01/2016, 09/11/2017, 08/06/2018, 07/15/2019, 07/22/2020, 07/18/2021, 08/18/2022   Influenza,trivalent, recombinat, inj, PF 12/18/2011   PFIZER(Purple Top)SARS-COV-2 Vaccination 12/28/2019, 01/20/2020, 08/23/2020   Pfizer Covid-19 Vaccine Bivalent Booster 68yrs & up 10/11/2021   Pfizer(Comirnaty)Fall Seasonal Vaccine 12 years and older 09/19/2022, 07/18/2023   Pneumococcal Polysaccharide-23 10/02/2012, 07/22/2020   Tdap 12/18/2011   Zoster Recombinant(Shingrix) 08/17/2020, 10/19/2020, 07/20/2023    Past Medical History:  Diagnosis Date   Arthritis    Asthma    Complication of anesthesia    one time woke up and was vey scared,17 yrs ago   Deaf    Diabetes mellitus without complication (HCC)    GERD (gastroesophageal reflux disease)    Hypertension    Left groin pain    Nausea with vomiting 02/19/2014   Thyroid disease     Tobacco History: Social History   Tobacco Use  Smoking Status Never  Smokeless Tobacco Never   Counseling given: Not Answered   Outpatient Medications Prior to Visit  Medication Sig Dispense Refill   allopurinol (ZYLOPRIM) 100 MG tablet TAKE 1 TABLET BY MOUTH EVERY DAY (Patient taking differently: as needed.) 90 tablet 3   amLODipine (NORVASC) 5 MG tablet Take 1 tablet (5 mg total) by mouth daily. 90 tablet 1   Azelastine HCl 137 MCG/SPRAY SOLN PLACE 2 SPRAYS INTO BOTH NOSTRILS 2 (TWO) TIMES DAILY. USE IN EACH NOSTRIL AS DIRECTED (Patient taking differently: as needed.) 30 mL 3   benzonatate (TESSALON) 200 MG capsule Take 1 capsule (200 mg total) by mouth 2 (two) times daily as needed for cough. 20 capsule 0   blood  glucose meter kit and supplies KIT Dispense based on patient and insurance preference. Use up to four times daily as directed. (FOR ICD-9 250.00, 250.01). 1 each 0   Budeson-Glycopyrrol-Formoterol (BREZTRI AEROSPHERE) 160-9-4.8 MCG/ACT AERO Inhale 2 puffs into the lungs in the morning and at bedtime. 10.7 g 0    Budeson-Glycopyrrol-Formoterol (BREZTRI AEROSPHERE) 160-9-4.8 MCG/ACT AERO Inhale 2 puffs into the lungs in the morning and at bedtime. DX J45.40 10.7 each 11   cephALEXin (KEFLEX) 500 MG capsule Take 1 capsule (500 mg total) by mouth 2 (two) times daily. 20 capsule 0   cetirizine (ZYRTEC) 10 MG tablet Take 1 tablet (10 mg total) by mouth daily. 90 tablet 1   cyclobenzaprine (FLEXERIL) 10 MG tablet Take 1 tablet (10 mg total) by mouth 3 (three) times daily as needed for muscle spasms. 30 tablet 0   fluticasone (FLONASE) 50 MCG/ACT nasal spray Place 2 sprays into both nostrils daily. 16 g 6   HYDROcodone-acetaminophen (NORCO/VICODIN) 5-325 MG tablet Take 1 tablet by mouth every 6 (six) hours as needed. 15 tablet 0   meloxicam (MOBIC) 7.5 MG tablet TAKE 1 TO 2 TABLETS BY MOUTH DAILY AS NEEDED 60 tablet 1   metFORMIN (GLUCOPHAGE-XR) 500 MG 24 hr tablet TAKE 2 TABLETS BY MOUTH EVERY DAY WITH BREAKFAST 180 tablet 1   omeprazole (PRILOSEC) 20 MG capsule Take 1 capsule (20 mg total) by mouth 2 (two) times daily before a meal. 180 capsule 1   rosuvastatin (CRESTOR) 20 MG tablet Take 1 tablet (20 mg total) by mouth at bedtime. 90 tablet 1   Semaglutide (RYBELSUS) 7 MG TABS Take 1 tablet (7 mg total) by mouth daily. 90 tablet 1   solifenacin (VESICARE) 5 MG tablet Take 1 tablet (5 mg total) by mouth daily. 90 tablet 1   terconazole (TERAZOL 7) 0.4 % vaginal cream PLACE 1 APPLICATOR VAGINALLY AT BEDTIME. 45 g 0   albuterol (VENTOLIN HFA) 108 (90 Base) MCG/ACT inhaler Inhale 2 puffs into the lungs every 6 (six) hours as needed for wheezing or shortness of breath. J45.40 8 g 6   Facility-Administered Medications Prior to Visit  Medication Dose Route Frequency Provider Last Rate Last Admin   alum & mag hydroxide-simeth (MAALOX/MYLANTA) 200-200-20 MG/5ML suspension 30 mL  30 mL Oral Once Saguier, Ramon Dredge, PA-C       lidocaine (XYLOCAINE) 2 % viscous mouth solution 15 mL  15 mL Mouth/Throat Once Saguier, Edward,  PA-C       nitroGLYCERIN (NITRODUR - Dosed in mg/24 hr) patch 0.2 mg  0.2 mg Transdermal Daily Lenn Sink, DPM         Review of Systems:   Constitutional:   No  weight loss, night sweats,  Fevers, chills, fatigue, or  lassitude.  HEENT:   No headaches,  Difficulty swallowing,  Tooth/dental problems, or  Sore throat,                No sneezing, itching, ear ache,  +nasal congestion, post nasal drip,   CV:  No chest pain,  Orthopnea, PND, swelling in lower extremities, anasarca, dizziness, palpitations, syncope.   GI  No heartburn, indigestion, abdominal pain, nausea, vomiting, diarrhea, change in bowel habits, loss of appetite, bloody stools.   Resp:   No chest wall deformity  Skin: no rash or lesions.  GU: no dysuria, change in color of urine, no urgency or frequency.  No flank pain, no hematuria   MS:  No joint pain or swelling.  No decreased  range of motion.  No back pain.    Physical Exam  BP 108/72 (BP Location: Left Arm, Patient Position: Sitting, Cuff Size: Large)   Pulse 80   Temp 97.8 F (36.6 C) (Oral)   Ht 5\' 4"  (1.626 m)   Wt 220 lb 3.2 oz (99.9 kg)   LMP 05/20/2012   SpO2 100%   BMI 37.80 kg/m   GEN: A/Ox3; pleasant , NAD, well nourished    HEENT:  [place]/AT,  NOSE-clear, THROAT-clear, no lesions, no postnasal drip or exudate noted.   NECK:  Supple w/ fair ROM; no JVD; normal carotid impulses w/o bruits; no thyromegaly or nodules palpated; no lymphadenopathy.    RESP  Clear  P & A; w/o, wheezes/ rales/ or rhonchi. no accessory muscle use, no dullness to percussion  CARD:  RRR, no m/r/g, no peripheral edema, pulses intact, no cyanosis or clubbing.  GI:   Soft & nt; nml bowel sounds; no organomegaly or masses detected.   Musco: Warm bil, no deformities or joint swelling noted.   Neuro: alert, no focal deficits noted.    Skin: Warm, no lesions or rashes    Lab Results:   BNP   Imaging:      Latest Ref Rng & Units 01/22/2019    2:35 PM  07/21/2013   12:03 PM  PFT Results  FVC-Pre L 1.60    FVC-Predicted Pre % 61  56      FVC-Post L 1.66    FVC-Predicted Post % 63    Pre FEV1/FVC % % 89  89      Post FEV1/FCV % % 88    FEV1-Pre L 1.42  1.37      FEV1-Predicted Pre % 69  62      FEV1-Post L 1.45    DLCO uncorrected ml/min/mmHg 17.81    DLCO UNC% % 91    DLCO corrected ml/min/mmHg 17.03    DLCO COR %Predicted % 87    DLVA Predicted % 148    TLC L 3.13    TLC % Predicted % 65    RV % Predicted % 65       This result is from an external source.    No results found for: "NITRICOXIDE"      Assessment & Plan:  Assessment and Plan    Asthma   Her asthma is generally well-controlled, with occasional nocturnal exacerbations likely due to inconsistent use of the Breztri inhaler. We reinforced the consistent use of the Breztri inhaler, two puffs twice daily, She should continue using the albuterol inhaler as a rescue medication. Asthma action plan discussed Follow-up is scheduled in six months and As needed    Allergic Rhinitis   Her allergic rhinitis is managed with daily cetirizine and Flonase as needed  She will continue daily cetirizine and use Flonase as needed for allergy drainage.  General Health Maintenance   She is up-to-date with flu and COVID-19 vaccinations.  Follow-up   A follow-up appointment is scheduled in six months. She is advised to contact the clinic if symptoms worsen or concerns arise before the next visit.        I spent  31  minutes dedicated to the care of this patient on the date of this encounter to include pre-visit review of records, face-to-face time with the patient discussing conditions above, post visit ordering of testing, clinical documentation with the electronic health record, making appropriate referrals as documented, and communicating necessary findings to members of the patients  care team.   Rubye Oaks, NP 10/15/2023

## 2023-10-17 ENCOUNTER — Telehealth: Payer: Self-pay

## 2023-10-17 NOTE — Telephone Encounter (Signed)
PA initiated via Covermymeds; KEY:  [id number]. Awaiting determination.

## 2023-10-17 NOTE — Telephone Encounter (Signed)
PA cancelled by plan.   Your PA request has been closed. abort, creating reconsideration - AM, Engineer, structural 10/17/2023 02:14 PM

## 2023-10-22 ENCOUNTER — Ambulatory Visit (HOSPITAL_BASED_OUTPATIENT_CLINIC_OR_DEPARTMENT_OTHER)
Admission: RE | Admit: 2023-10-22 | Discharge: 2023-10-22 | Disposition: A | Discharge status: Home / Self Care | Payer: Federal, State, Local not specified - PPO | Source: Ambulatory Visit | Attending: Family Medicine | Admitting: Family Medicine

## 2023-10-22 ENCOUNTER — Encounter (HOSPITAL_BASED_OUTPATIENT_CLINIC_OR_DEPARTMENT_OTHER): Payer: Self-pay

## 2023-10-22 DIAGNOSIS — Z1231 Encounter for screening mammogram for malignant neoplasm of breast: Secondary | ICD-10-CM | POA: Insufficient documentation

## 2023-10-25 ENCOUNTER — Telehealth: Payer: Self-pay

## 2023-10-25 NOTE — Telephone Encounter (Signed)
Your PA request has been closed. Aborting case for Appeal of [phone] A

## 2023-10-25 NOTE — Telephone Encounter (Signed)
PA initiated via Covermymeds; KEY: [id number]. Awaiting determination.

## 2023-10-26 ENCOUNTER — Telehealth: Payer: Self-pay

## 2023-10-26 NOTE — Telephone Encounter (Signed)
Will see if PA team can run test claim to see what this code means. See other telephone note.

## 2023-10-26 NOTE — Telephone Encounter (Signed)
Checked [address] and did not see PA for Rybelsus. Telephone notes states PA was closed. Please advise

## 2023-10-26 NOTE — Telephone Encounter (Signed)
Copied from CRM [id number]. Topic: Clinical - Medication Question >> Oct 26, 2023  8:47 AM Dimitri Ped wrote: Reason for CRM: [facility] concerning [facility] medication. Needed aprior authorization. Just wanted to make sure dr or nurse received paper rybelsus 7 mg

## 2023-10-29 ENCOUNTER — Telehealth: Payer: Self-pay

## 2023-10-29 ENCOUNTER — Other Ambulatory Visit (HOSPITAL_COMMUNITY): Payer: Self-pay

## 2023-10-29 NOTE — Telephone Encounter (Signed)
Pharmacy Patient Advocate Encounter   Received notification from Pt Calls Messages that prior authorization for Rybelsus 7MG  tablets is required/requested.   Insurance verification completed.   The patient is insured through [facility] .   PA previously denied. Please see telephone encounter on 08/31/23. Denial letter is in media.   Insurance verification completed.   The patient is insured through [employer] .   Per test claim: The current 90 day co-pay is, $44.99.  No PA needed at this time. This test claim was processed through [facility]- copay amounts may vary at other pharmacies due to pharmacy/plan contracts, or as the patient moves through the different stages of their insurance plan.

## 2023-10-29 NOTE — Telephone Encounter (Signed)
Patient has 2 insurances.   PA previously denied through [employer]. Please see telephone encounter on 08/31/23. Denial letter is in media.    Per test claim for [employer]: The current 90 day co-pay is, $44.99.  No PA needed at this time. This test claim was processed through [facility]- copay amounts may vary at other pharmacies due to pharmacy/plan contracts, or as the patient moves through the different stages of their insurance plan.

## 2023-11-05 ENCOUNTER — Telehealth: Payer: Self-pay | Admitting: Family Medicine

## 2023-11-05 NOTE — Telephone Encounter (Signed)
Pt dropped off paperwork for pcp. Pt needs back please call when done with papers.

## 2023-11-06 NOTE — Telephone Encounter (Signed)
Received

## 2023-11-12 ENCOUNTER — Telehealth: Payer: Self-pay

## 2023-11-12 ENCOUNTER — Encounter: Payer: Self-pay | Admitting: Family Medicine

## 2023-11-12 NOTE — Telephone Encounter (Signed)
 Your PA request has been closed. Aborting since Appeal on file awaiting determination in Pharmd queue - [facility], Pharmacy Technician 11/12/2023 12:30 PM

## 2023-11-12 NOTE — Telephone Encounter (Signed)
 PA initiated via Covermymeds; KEY: [id number]. Awaiting determination.

## 2023-11-20 ENCOUNTER — Telehealth: Payer: Self-pay | Admitting: Pulmonary Disease

## 2023-11-20 DIAGNOSIS — J4540 Moderate persistent asthma, uncomplicated: Secondary | ICD-10-CM

## 2023-11-20 NOTE — Telephone Encounter (Signed)
 Patient states needs refill for Breztri. Pharmacy is [facility]. Patient phone number is [phone].

## 2023-11-21 ENCOUNTER — Telehealth: Payer: Self-pay | Admitting: *Deleted

## 2023-11-21 NOTE — Progress Notes (Signed)
   Rubin Payor, PhD, LAT, ATC acting as a scribe for [name], MD.  ETHELYNE LINCE is a 63 y.o. female who presents to [facility] at [facility] today for cont'd R hip pain. Pt locates pain to the anterior aspect of her R hip and into the R groin. Getting up from a chair and even small steps are painful. Last R interarticular hip injection was on 04/03/23. Pain is worsening.   She notes she just retired in Dec and is loving being home. She is wondering if there is something she can be prescribed for her pain.   Dx imaging: 04/03/23 R hip XR  Pertinent review of systems: No fevers or chills  Relevant historical information: History of a left hip replacement.  Hypertension and diabetes.  Deaf using an American sign language interpreter to communicate. 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   Exam:  BP 108/82   Pulse 64   Ht 5\' 4"  (1.626 m)   LMP 05/20/2012   SpO2 98%   BMI 37.80 kg/m  General: Well Developed, well nourished, and in no acute distress.   MSK: Right hip normal-appearing Decreased range of motion pain with flexion and internal rotation.    Lab and Radiology Results  Procedure: Real-time Ultrasound Guided Injection of right hip femoral acetabular joint anterior approach Device: Philips Affiniti 50G/GE Logiq Images permanently stored and available for review in PACS Verbal informed consent obtained.  Discussed risks and benefits of procedure. Warned about infection, bleeding, hyperglycemia damage to structures among others. Patient expresses understanding and agreement Time-out conducted.   Noted no overlying erythema, induration, or other signs of local infection.   Skin prepped in a sterile fashion.   Local anesthesia: Topical Ethyl chloride.   With sterile technique and under real time ultrasound guidance: 40 mg of Kenalog and 2 ml of Marcaine injected into hip joint. Fluid seen entering the joint  capsule.   Completed without difficulty   Pain immediately resolved suggesting accurate placement of the medication.   Advised to call if fevers/chills, erythema, induration, drainage, or persistent bleeding.   Images permanently stored and available for review in the ultrasound unit.  Impression: Technically successful ultrasound guided injection.         Assessment and Plan: 63 y.o. 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  DJD seen on x-ray in May 2024.  Plan for repeat injection today.  Can repeat this injection every 3 months.  If this shot is not working very well would recommend repeat imaging including x-ray and ultimately MRI.   PDMP not reviewed this encounter. Orders Placed This Encounter  Procedures   [place] LIMITED JOINT SPACE STRUCTURES LOW RIGHT(NO LINKED CHARGES)    Reason for Exam (SYMPTOM  OR DIAGNOSIS REQUIRED):   right hip pain    Preferred imaging location?:   [facility]   No orders of the defined types were placed in this encounter.    Discussed warning signs or symptoms. Please see discharge instructions. Patient expresses understanding.   The above documentation has been reviewed and is accurate and complete [name], M.D.

## 2023-11-21 NOTE — Telephone Encounter (Signed)
 Prior auth started via cover my meds.  Awaiting determination.  Key: [id number]

## 2023-11-22 ENCOUNTER — Ambulatory Visit: Payer: Federal, State, Local not specified - PPO | Admitting: Family Medicine

## 2023-11-22 ENCOUNTER — Other Ambulatory Visit: Payer: Self-pay

## 2023-11-22 ENCOUNTER — Other Ambulatory Visit: Payer: Self-pay | Admitting: Family Medicine

## 2023-11-22 VITALS — BP 108/82 | HR 64 | Ht 64.0 in

## 2023-11-22 DIAGNOSIS — I10 Essential (primary) hypertension: Secondary | ICD-10-CM

## 2023-11-22 DIAGNOSIS — M25551 Pain in right hip: Secondary | ICD-10-CM

## 2023-11-22 DIAGNOSIS — R1031 Right lower quadrant pain: Secondary | ICD-10-CM

## 2023-11-22 MED ORDER — BREZTRI AEROSPHERE 160-9-4.8 MCG/ACT IN AERO: 2.0000 | INHALATION_SPRAY | Freq: Two times a day (BID) | RESPIRATORY_TRACT | 11 refills | Status: DC

## 2023-11-22 NOTE — Patient Instructions (Addendum)
Thank you for coming in today.   You received an injection today. Seek immediate medical attention if the joint becomes red, extremely painful, or is oozing fluid.   If this does not last we can do more including xray and MRI.   We can repeat the injection in 3 month.

## 2023-11-22 NOTE — Telephone Encounter (Signed)
Spoke with Cala Bradford at [facility] and they stated that we can do an external appeal.  She will fax over denial letter from 11/20/23.

## 2023-11-22 NOTE — Telephone Encounter (Signed)
Called patient to notify Left message with sign language line that inhalers rf sent to pharmacy.

## 2023-11-23 ENCOUNTER — Encounter: Payer: Self-pay | Admitting: *Deleted

## 2023-11-23 NOTE — Telephone Encounter (Signed)
Tried to call patient but vm was full to let here know that we need her to come in to sign appeal form for her Rybels[employer].  Paper is on [employer] folder with patient name.  Also sent patient a mychart message

## 2023-11-29 ENCOUNTER — Encounter: Payer: Self-pay | Admitting: Family Medicine

## 2023-12-10 ENCOUNTER — Telehealth: Payer: Self-pay

## 2023-12-10 NOTE — Telephone Encounter (Signed)
PA initiated via Covermymeds; KEY:  [id number]. Awaiting determination.

## 2023-12-12 ENCOUNTER — Other Ambulatory Visit (HOSPITAL_COMMUNITY): Payer: Self-pay

## 2023-12-12 NOTE — Telephone Encounter (Signed)
 Pharmacy Patient Advocate Encounter  Received notification from [facility] that Prior Authorization for Rosuvastatin  20 mg tablets has been APPROVED from 12/12/23 to 12/11/24. Ran test claim, Copay is $0. This test claim was processed through [facility]- copay amounts may vary at other pharmacies due to pharmacy/plan contracts, or as the patient moves through the different stages of their insurance plan.   PA #/Case ID/Reference #: [id number]

## 2023-12-13 NOTE — Telephone Encounter (Signed)
 PA denied. Awaiting denial information.

## 2023-12-13 NOTE — Telephone Encounter (Signed)
 PA denied. Rhonda Morrow health requires trial and failure of atorvastatin  40mg  to 80mg 

## 2023-12-17 ENCOUNTER — Ambulatory Visit (INDEPENDENT_AMBULATORY_CARE_PROVIDER_SITE_OTHER): Payer: Federal, State, Local not specified - PPO

## 2023-12-17 ENCOUNTER — Encounter: Payer: Self-pay | Admitting: Family Medicine

## 2023-12-17 ENCOUNTER — Ambulatory Visit (INDEPENDENT_AMBULATORY_CARE_PROVIDER_SITE_OTHER): Payer: Federal, State, Local not specified - PPO | Admitting: Family Medicine

## 2023-12-17 VITALS — BP 122/84 | HR 67 | Ht 64.0 in | Wt 219.0 lb

## 2023-12-17 DIAGNOSIS — R1031 Right lower quadrant pain: Secondary | ICD-10-CM

## 2023-12-17 DIAGNOSIS — M25551 Pain in right hip: Secondary | ICD-10-CM

## 2023-12-17 DIAGNOSIS — G8929 Other chronic pain: Secondary | ICD-10-CM

## 2023-12-17 DIAGNOSIS — Z96642 Presence of left artificial hip joint: Secondary | ICD-10-CM | POA: Diagnosis not present

## 2023-12-17 DIAGNOSIS — M1611 Unilateral primary osteoarthritis, right hip: Secondary | ICD-10-CM | POA: Diagnosis not present

## 2023-12-17 DIAGNOSIS — Z471 Aftercare following joint replacement surgery: Secondary | ICD-10-CM | POA: Diagnosis not present

## 2023-12-17 MED ORDER — TRAMADOL HCL 50 MG PO TABS: 50.0000 mg | ORAL_TABLET | Freq: Three times a day (TID) | ORAL | 0 refills | Status: DC | PRN

## 2023-12-17 NOTE — Patient Instructions (Addendum)
 Thank you for coming in today.  Please get an Xray today before you leave   Possibly we will do a MRI.   Will try tramadol  in addition to tylenol .

## 2023-12-17 NOTE — Progress Notes (Signed)
   I, Miquel Amen, CMA acting as a scribe for Rhonda Juniper, MD.  Rhonda Morrow is a 63 y.o. female who presents to [facility] at [facility] today for continued right groin pain. Pt locates pain to right groin. Pt was given right hip injection on 11/22/23.   Today, patient reports continued groin pain, minimal relief s/p injection. Sx flared back up while doing exercises and with the colder weather. Taking Tylenol  prn with minimal relief. Interested in stronger medication to help manage sx.    Pertinent review of systems: No fever or chills  Relevant historical information: history of left total hip replacement.    Exam:  BP 122/84   Pulse 67   Ht 5\' 4"  (1.626 m)   Wt 219 lb (99.3 kg)   LMP 05/20/2012   SpO2 99%   BMI 37.59 kg/m  General: Well Developed, well nourished, and in no acute distress.   MSK: Right hip decreased range of motion.    Lab and Radiology Results  X-ray images right hip obtained today personally and independently interpreted. Mild right hip DJD.  No acute fractures. Await formal radiology review    Assessment and Plan: 63 y.o. female with chronic right anterior hip pain.  Patient had temporary but good pain relief following intra-articular cortisone injection on November 22, 2023.  The shot only lasted for few weeks unfortunately.  X-ray today does not show severe arthritis.  She has a mismatch between pain and imaging.  Plan for MRI to further characterize source of pain.  Tramadol  prescribed for limited use for severe pain.   PDMP reviewed during this encounter. Orders Placed This Encounter  Procedures   DG HIP UNILAT WITH PELVIS 2-3 VIEWS RIGHT    Standing Status:   Future    Number of Occurrences:   1    Expiration Date:   12/16/2024    Reason for Exam (SYMPTOM  OR DIAGNOSIS REQUIRED):   eval hip pai    Is patient pregnant?:   No    Preferred imaging location?:   [facility]   MR HIP RIGHT WO CONTRAST    Standing  Status:   Future    Expiration Date:   12/16/2024    What is the patient's sedation requirement?:   No Sedation    Does the patient have a pacemaker or implanted devices?:   No    Preferred imaging location?:   [facility] (table limit-550lbs)   Meds ordered this encounter  Medications   traMADol  (ULTRAM ) 50 MG tablet    Sig: Take 1 tablet (50 mg total) by mouth every 8 (eight) hours as needed for severe pain (pain score 7-10).    Dispense:  15 tablet    Refill:  0     Discussed warning signs or symptoms. Please see discharge instructions. Patient expresses understanding.   The above documentation has been reviewed and is accurate and complete Rhonda Morrow, M.D.

## 2023-12-18 NOTE — Telephone Encounter (Signed)
Pt called. LVM to call back.

## 2023-12-19 ENCOUNTER — Other Ambulatory Visit: Payer: Self-pay | Admitting: Family Medicine

## 2023-12-19 DIAGNOSIS — N898 Other specified noninflammatory disorders of vagina: Secondary | ICD-10-CM

## 2023-12-27 ENCOUNTER — Ambulatory Visit (HOSPITAL_BASED_OUTPATIENT_CLINIC_OR_DEPARTMENT_OTHER): Admission: RE | Admit: 2023-12-27 | Payer: 59 | Source: Ambulatory Visit

## 2023-12-31 ENCOUNTER — Other Ambulatory Visit: Payer: Federal, State, Local not specified - PPO

## 2023-12-31 ENCOUNTER — Encounter: Payer: Self-pay | Admitting: Family Medicine

## 2023-12-31 DIAGNOSIS — R1031 Right lower quadrant pain: Secondary | ICD-10-CM

## 2023-12-31 DIAGNOSIS — G8929 Other chronic pain: Secondary | ICD-10-CM

## 2023-12-31 NOTE — Progress Notes (Signed)
 Right hip x-ray shows arthritis.

## 2024-01-01 NOTE — Telephone Encounter (Signed)
 Forwarding to Dr. Denyse Amass to review and advise.

## 2024-01-01 NOTE — Telephone Encounter (Signed)
 Forwarding to Dr. Denyse Amass.

## 2024-01-02 ENCOUNTER — Other Ambulatory Visit (HOSPITAL_COMMUNITY): Payer: Self-pay

## 2024-01-02 ENCOUNTER — Telehealth: Payer: Self-pay | Admitting: Pharmacy Technician

## 2024-01-02 MED ORDER — ATORVASTATIN CALCIUM 20 MG PO TABS: 20.0000 mg | ORAL_TABLET | Freq: Every day | ORAL | 2 refills | Status: DC

## 2024-01-02 NOTE — Telephone Encounter (Signed)
Yes, it has.

## 2024-01-02 NOTE — Telephone Encounter (Signed)
 I will call the patient. Will the new prescription for Lipitor be sent to her pharmacy?

## 2024-01-02 NOTE — Telephone Encounter (Signed)
 Pharmacy Patient Advocate Encounter   Received notification from CoverMyMeds that prior authorization for ROSUVASATIN 20MG  TABLETS is required/requested.   Insurance verification completed.   The patient is insured through [facility] .   Per test claim:  ATORVASTATIN is preferred by the insurance.  If suggested medication is appropriate, Please send in a new RX and discontinue this one. If not, please advise as to why it's not appropriate so that we may request a Prior Authorization. Please note, some preferred medications may still require a PA.  If the suggested medications have not been trialed and there are no contraindications to their use, the PA will not be submitted, as it will not be approved.

## 2024-01-02 NOTE — Telephone Encounter (Signed)
 Forwarding to Dr. Denyse Amass.

## 2024-01-03 MED ORDER — LORAZEPAM 0.5 MG PO TABS: ORAL_TABLET | ORAL | 0 refills | Status: DC

## 2024-01-04 ENCOUNTER — Ambulatory Visit (HOSPITAL_BASED_OUTPATIENT_CLINIC_OR_DEPARTMENT_OTHER)
Admission: RE | Admit: 2024-01-04 | Discharge: 2024-01-04 | Disposition: A | Discharge status: Home / Self Care | Payer: 59 | Source: Ambulatory Visit | Attending: Family Medicine | Admitting: Family Medicine

## 2024-01-04 ENCOUNTER — Encounter (HOSPITAL_BASED_OUTPATIENT_CLINIC_OR_DEPARTMENT_OTHER): Payer: Self-pay

## 2024-01-04 DIAGNOSIS — R1031 Right lower quadrant pain: Secondary | ICD-10-CM

## 2024-01-07 ENCOUNTER — Other Ambulatory Visit: Payer: Self-pay | Admitting: Family Medicine

## 2024-01-09 NOTE — Addendum Note (Signed)
 Addended by: Rodolph Bong on: 01/09/2024 07:36 AM   Modules accepted: Orders

## 2024-01-14 ENCOUNTER — Other Ambulatory Visit (HOSPITAL_COMMUNITY): Payer: Self-pay

## 2024-01-15 ENCOUNTER — Telehealth: Payer: Self-pay | Admitting: *Deleted

## 2024-01-15 NOTE — Telephone Encounter (Signed)
 Copied from CRM [id number]. Topic: General - Other >> Jan 15, 2024  2:56 PM Elizebeth Brooking wrote: Reason for CRM: Sonya from [facility] called in regarding a fax that was mistakenly faxed to  Them and needed to go to [facility]

## 2024-01-15 NOTE — Telephone Encounter (Signed)
Appeal letter faxed today. 

## 2024-01-15 NOTE — Telephone Encounter (Signed)
 Form faxed to correct place

## 2024-01-17 ENCOUNTER — Other Ambulatory Visit: Payer: Self-pay | Admitting: Family Medicine

## 2024-01-17 DIAGNOSIS — R1013 Epigastric pain: Secondary | ICD-10-CM

## 2024-01-18 ENCOUNTER — Other Ambulatory Visit (HOSPITAL_COMMUNITY): Payer: Self-pay

## 2024-01-28 ENCOUNTER — Other Ambulatory Visit (HOSPITAL_COMMUNITY): Payer: Self-pay

## 2024-01-29 ENCOUNTER — Encounter: Payer: Self-pay | Admitting: Family Medicine

## 2024-01-30 ENCOUNTER — Ambulatory Visit: Admitting: Medical

## 2024-01-31 ENCOUNTER — Other Ambulatory Visit

## 2024-02-01 ENCOUNTER — Encounter: Payer: Self-pay | Admitting: Family Medicine

## 2024-02-05 ENCOUNTER — Other Ambulatory Visit (HOSPITAL_BASED_OUTPATIENT_CLINIC_OR_DEPARTMENT_OTHER): Payer: Self-pay

## 2024-02-05 ENCOUNTER — Ambulatory Visit: Admitting: Family Medicine

## 2024-02-05 ENCOUNTER — Encounter: Payer: Self-pay | Admitting: Family Medicine

## 2024-02-05 VITALS — BP 138/70 | HR 78 | Temp 98.2°F | Resp 18 | Ht 64.0 in | Wt 209.0 lb

## 2024-02-05 DIAGNOSIS — J40 Bronchitis, not specified as acute or chronic: Secondary | ICD-10-CM | POA: Diagnosis not present

## 2024-02-05 DIAGNOSIS — J069 Acute upper respiratory infection, unspecified: Secondary | ICD-10-CM | POA: Diagnosis not present

## 2024-02-05 MED ORDER — AZITHROMYCIN 250 MG PO TABS
ORAL_TABLET | ORAL | 0 refills | Status: AC
  Filled 2024-02-05: qty 6, 5d supply, fill #0

## 2024-02-05 MED ORDER — FLUTICASONE PROPIONATE 50 MCG/ACT NA SUSP
2.0000 | Freq: Every day | NASAL | 6 refills | Status: DC
  Filled 2024-02-05: qty 16, 30d supply, fill #0

## 2024-02-05 MED ORDER — PROMETHAZINE-DM 6.25-15 MG/5ML PO SYRP
5.0000 mL | ORAL_SOLUTION | Freq: Four times a day (QID) | ORAL | 0 refills | Status: DC | PRN
  Filled 2024-02-05: qty 118, 6d supply, fill #0

## 2024-02-05 NOTE — Progress Notes (Signed)
                                                                                                                                                                                                                                                                                                                                                                                                                                                                                                                                                                                                                                                                                                                                                                                                                                                                                                                                                      ++     Established Patient Office Visit  Subjective   Patient ID: Rhonda Morrow, female    DOB: 1960/12/30   Age: 63 y.o. MRN: [id number]  Chief Complaint  Patient presents with   Cough    Onset: 1 week , mucus     HPI Discussed the use of AI scribe software for clinical note transcription with the patient, who gave verbal consent to proceed.  History of Present Illness Rhonda Morrow is a 63 year old female who presents with persistent cough and respiratory symptoms.  She has been experiencing a persistent cough since last Monday, described as severe and accompanied by phlegm production. Over-the-counter medications such as Mucinex DM and Robitussin have been used without significant relief. She has also been using her inhaler to manage respiratory symptoms, which has helped with wheezing. Difficulty breathing due to phlegm and hot flashes have been noted over the past week.  She experienced a fever lasting two to three days last week, although she did not measure her temperature. Sinus pressure, watery eyes, and a stuffy nose with occasional nasal dripping from one nostril have also been present. No significant nasal drainage, but the nose is primarily stuffy.  Episodes of diarrhea occurred from Monday to Wednesday last week, which have since decreased. She attributes some gastrointestinal symptoms to stress from caring for her family, as both her mother and Adela Glimpse were also unwell.  She has a history of high blood pressure, with readings reaching 230 mmHg. She has been taking amlodipine, initially two tablets, but her blood pressure remained elevated. By Thursday or Friday of last week, her blood pressure decreased to 150 mmHg after adjusting her medication regimen.    Patient Active Problem List   Diagnosis Date Noted   Irritable bowel syndrome with both constipation and diarrhea 07/20/2023   Urinary incontinence 01/31/2023   Vaginal itching 01/31/2023   Influenza 11/20/2022   Postmenopausal bleeding 08/18/2022   Acute non-recurrent pansinusitis 06/18/2022   Leg cramps 03/08/2022    Muscle spasm of right lower extremity 03/08/2022   Primary osteoarthritis of right hip 03/01/2022   ACE inhibitor intolerance 01/19/2022   Weakness 12/22/2021   Left lower quadrant abdominal pain 12/22/2021   Slow transit constipation 12/22/2021   Class 3 severe obesity due to excess calories without serious comorbidity with body mass index (BMI) of 40.0 to 44.9 in adult [facility]) 07/05/2021   Restless leg syndrome 07/05/2021   Chronic seasonal allergic rhinitis due to pollen 07/05/2021   Psychophysiological insomnia 07/05/2021   Non-compliance 07/05/2021   COVID-19 07/05/2021   Right wrist pain 06/05/2021   Acute pain of left shoulder 01/24/2021   Acute pain of right shoulder 01/24/2021   Foot pain, bilateral 01/24/2021   Dyspepsia 01/24/2021   Preventative health care 01/24/2021   Right hand pain 12/29/2020   OSA (obstructive sleep apnea) 09/12/2020   Nocturia more than twice per night 08/03/2020   Non-restorative sleep 08/03/2020   Leg cramping 08/03/2020   Hyperlipidemia associated with type 2 diabetes mellitus (HCC) 07/25/2020   Large breasts 07/22/2020   RLS (restless legs syndrome) 05/17/2020   Cough 04/01/2020   Asthma, moderate persistent, poorly-controlled 04/01/2020   Medication management 04/01/2020   Menopausal symptoms 03/29/2020   Hot flashes 03/29/2020   Chronic right shoulder pain 08/11/2019   Acute non-recurrent maxillary sinusitis 08/11/2019   BMI 40.0-44.9, adult (HCC) 01/22/2019   Right lower quadrant abdominal pain 10/20/2018   Rectal pain 10/20/2018   Vitamin B 12 deficiency 03/28/2018   B12 deficiency  03/28/2018   DM (diabetes mellitus) type II uncontrolled, periph vascular disorder 03/28/2018   Hyperlipidemia 03/28/2018   Primary osteoarthritis of left hip 02/21/2017   Status post total hip replacement, left 02/21/2017   History of laparoscopic cholecystectomy 06/20/2016   Cerumen impaction 06/09/2016   Hypersomnia 05/16/2016   Knee pain, right  05/05/2016   RUQ pain 04/09/2016   Abnormal CT scan 03/28/2016   Dyspnea and respiratory abnormalities 03/15/2016   Asthma with acute exacerbation 03/15/2016   Asthma in adult 02/25/2016   DM (diabetes mellitus) type II controlled, neurological manifestation (HCC) 02/09/2016   Left hip pain 12/23/2015   Right hamstring muscle strain 11/18/2015   Abdominal pain, acute 09/01/2015   Acute asthma exacerbation 04/16/2015   Acute bronchitis 04/14/2015   Pap smear for cervical cancer screening 09/14/2014   Bed bug bite 05/06/2014   Allergic rhinitis 04/09/2014   Rectal itching 04/09/2014   Bladder spasm 04/09/2014   Knee pain, left 03/05/2014   Hypokalemia 02/19/2014   Nausea with vomiting 02/19/2014   Diabetes mellitus, type II (HCC) 02/19/2014   Edema 02/16/2014   Disorder of rotator cuff 11/12/2013   Routine general medical examination at a health care facility 08/20/2013   Gastroesophageal reflux disease 06/26/2013   Dysphagia, pharyngoesophageal phase 06/26/2013   Suprapubic abdominal pain 05/12/2013   Medication side effect 03/25/2013   Diarrhea 03/25/2013   Benign positional vertigo 01/30/2013   Traumatic hematoma of thigh 01/15/2013   HTN (hypertension) 09/02/2012   Dry skin 08/22/2012   External hemorrhoids 08/22/2012   Obesity 06/30/2012   Thyromegaly 05/22/2012   Back pain 05/22/2012   Elevated glucose 05/22/2012   Moderate persistent asthma 04/19/2012   Dyspnea 01/28/2012   Past Medical History:  Diagnosis Date   Arthritis    Asthma    Complication of anesthesia    one time woke up and was vey scared,17 yrs ago   Deaf    Diabetes mellitus without complication (HCC)    GERD (gastroesophageal reflux disease)    Hypertension    Left groin pain    Nausea with vomiting 02/19/2014   Thyroid disease    Past Surgical History:  Procedure Laterality Date   CARDIAC CATHETERIZATION     CESAREAN SECTION     CHOLECYSTECTOMY N/A 06/20/2016   Procedure: LAPAROSCOPIC  CHOLECYSTECTOMY;  Surgeon: Emelia Loron, MD;  Location: [facility] OR;  Service: General;  Laterality: N/A;   ESOPHAGEAL MANOMETRY N/A 09/29/2013   Procedure: ESOPHAGEAL MANOMETRY (EM);  Surgeon: Rachael Fee, MD;  Location: [facility] ENDOSCOPY;  Service: Endoscopy;  Laterality: N/A;   TOTAL HIP ARTHROPLASTY Left 02/21/2017   Procedure: LEFT TOTAL HIP ARTHROPLASTY ANTERIOR APPROACH;  Surgeon: Tarry Kos, MD;  Location: [facility] OR;  Service: Orthopedics;  Laterality: Left;   Social History   Tobacco Use   Smoking status: Never   Smokeless tobacco: Never  Vaping Use   Vaping status: Never Used  Substance Use Topics   Alcohol use: No    Alcohol/week: 0.0 standard drinks of alcohol   Drug use: No   Social History   Socioeconomic History   Marital status: Married    Spouse name: Not on file   Number of children: Not on file   Years of education: Not on file   Highest education level: Not on file  Occupational History   Occupation: disabled  Tobacco Use   Smoking status: Never   Smokeless tobacco: Never  Vaping Use   Vaping status: Never Used  Substance and Sexual  Activity   Alcohol use: No    Alcohol/week: 0.0 standard drinks of alcohol   Drug use: No   Sexual activity: Not Currently  Other Topics Concern   Not on file  Social History Narrative   Lives with family.  Has 3 children.  Works for [employer]  Education: high school.   Social Drivers of Corporate investment banker Strain: Not on file  Food Insecurity: Not on file  Transportation Needs: Not on file  Physical Activity: Not on file  Stress: Not on file  Social Connections: Unknown (03/18/2022)   Received from [facility]   Social Network    Social Network: Not on file  Intimate Partner Violence: Unknown (02/08/2022)   Received from [facility]   HITS    Physically Hurt: Not on file    Insult or Talk Down To: Not on file    Threaten Physical Harm: Not on file    Scream or Curse: Not on  file   Family Status  Relation Name Status   Mother  (Not Specified)   Father  (Not Specified)   Neg Hx  (Not Specified)  No partnership data on file   Family History  Problem Relation Age of Onset   Diabetes Mother    Heart disease Mother    Diabetes Father    Colon cancer Neg Hx    Colon polyps Neg Hx    Esophageal cancer Neg Hx    Rectal cancer Neg Hx    Stomach cancer Neg Hx    Allergies  Allergen Reactions   Losartan Shortness Of Breath   Oxycodone-Acetaminophen Nausea And Vomiting   Augmentin [Amoxicillin-Pot Clavulanate] Diarrhea      Review of Systems  Constitutional:  Negative for fever and malaise/fatigue.  HENT:  Positive for congestion and sinus pain. Negative for sore throat.   Eyes:  Negative for blurred vision.  Respiratory:  Positive for cough, sputum production and wheezing.   Cardiovascular:  Negative for chest pain and palpitations.  Gastrointestinal:  Negative for vomiting.  Musculoskeletal:  Negative for back pain.  Skin:  Negative for rash.  Neurological:  Negative for loss of consciousness and headaches.      Objective:     BP 138/70   Pulse 78   Temp 98.2 F (36.8 C)   Resp 18   Ht 5\' 4"  (1.626 m)   Wt 209 lb (94.8 kg)   LMP 05/20/2012   SpO2 100%   BMI 35.87 kg/m  BP Readings from Last 3 Encounters:  02/05/24 138/70  12/17/23 122/84  11/22/23 108/82   Wt Readings from Last 3 Encounters:  02/05/24 209 lb (94.8 kg)  12/17/23 219 lb (99.3 kg)  10/15/23 220 lb 3.2 oz (99.9 kg)   SpO2 Readings from Last 3 Encounters:  02/05/24 100%  12/17/23 99%  11/22/23 98%      Physical Exam Vitals and nursing note reviewed.  Constitutional:      General: She is not in acute distress.    Appearance: Normal appearance. She is well-developed.  HENT:     Head: Normocephalic and atraumatic.     Nose: Congestion and rhinorrhea present.     Right Sinus: Maxillary sinus tenderness and frontal sinus tenderness present.     Left Sinus:  Maxillary sinus tenderness and frontal sinus tenderness present.  Eyes:     General: No scleral icterus.       Right eye: No discharge.  Left eye: No discharge.  Cardiovascular:     Rate and Rhythm: Normal rate and regular rhythm.     Heart sounds: No murmur heard. Pulmonary:     Effort: Pulmonary effort is normal. No respiratory distress.     Breath sounds: Normal breath sounds.  Musculoskeletal:        General: Normal range of motion.     Cervical back: Normal range of motion and neck supple.     Right lower leg: No edema.     Left lower leg: No edema.  Skin:    General: Skin is warm and dry.  Neurological:     Mental Status: She is alert and oriented to person, place, and time.  Psychiatric:        Mood and Affect: Mood normal.        Behavior: Behavior normal.        Thought Content: Thought content normal.        Judgment: Judgment normal.      No results found for any visits on 02/05/24.  Last CBC Lab Results  Component Value Date   WBC 5.2 07/20/2023   HGB 14.1 07/20/2023   HCT 43.3 07/20/2023   MCV 84.7 07/20/2023   MCH 27.6 07/20/2023   RDW 13.2 07/20/2023   PLT 188 07/20/2023   Last metabolic panel Lab Results  Component Value Date   GLUCOSE 108 (H) 07/20/2023   NA 139 07/20/2023   K 3.4 (L) 07/20/2023   CL 100 07/20/2023   CO2 27 07/20/2023   BUN 14 07/20/2023   CREATININE 0.99 07/20/2023   GFR 61.04 12/14/2022   CALCIUM 9.5 07/20/2023   PHOS 3.0 03/07/2022   PROT 6.6 07/20/2023   ALBUMIN 3.8 12/14/2022   BILITOT 0.5 07/20/2023   ALKPHOS 63 12/14/2022   AST 16 07/20/2023   ALT 16 07/20/2023   ANIONGAP 13 05/24/2020   Last lipids Lab Results  Component Value Date   CHOL 113 07/20/2023   HDL 60 07/20/2023   LDLCALC 38 07/20/2023   TRIG 66 07/20/2023   CHOLHDL 1.9 07/20/2023   Last hemoglobin A1c Lab Results  Component Value Date   HGBA1C 6.7 (H) 07/20/2023   Last thyroid functions Lab Results  Component Value Date   TSH  1.23 08/18/2022   T4TOTAL 10.7 12/22/2021   Last vitamin D Lab Results  Component Value Date   VD25OH 52.50 02/08/2016   Last vitamin B12 and Folate Lab Results  Component Value Date   VITAMINB12 868 12/22/2021   FOLATE 19.2 05/14/2019      The ASCVD Risk score (Arnett DK, et al., 2019) failed to calculate for the following reasons:   The valid total cholesterol range is 130 to 320 mg/dL    Assessment & Plan:   Problem List Items Addressed This Visit   None Visit Diagnoses       Bronchitis    -  Primary   Relevant Medications   azithromycin (ZITHROMAX Z-PAK) 250 MG tablet   promethazine-dextromethorphan (PROMETHAZINE-DM) 6.25-15 MG/5ML syrup     Viral upper respiratory tract infection       Relevant Medications   azithromycin (ZITHROMAX Z-PAK) 250 MG tablet   fluticasone (FLONASE) 50 MCG/ACT nasal spray     Assessment and Plan Assessment & Plan Upper Respiratory Infection   Symptoms began last week, including persistent cough, phlegm production, sinus pressure, watery eyes, and nasal congestion, accompanied by fever and difficulty breathing due to phlegm. Over-the-counter medications, Mucinex DM and Robitussin,  provided limited relief. Inhaler use may have alleviated wheezing. A bacterial component is suspected due to symptom persistence. Prescribe Z-Pak (azithromycin) with informed consent, discussing potential gastrointestinal side effects and the importance of completing the full course. Prescribe cough syrup for nighttime use to alleviate cough symptoms. Prescribe Flonase nasal spray to address nasal congestion and sinus pressure.  Hypertension   Hypertension with recent episodes of significantly elevated blood pressure, reaching 230 mmHg. Currently on amlodipine, with blood pressure decreasing to 150 mmHg after dosage adjustment.    No follow-ups on file.    Donato Schultz, DO

## 2024-02-06 ENCOUNTER — Other Ambulatory Visit (HOSPITAL_COMMUNITY): Payer: Self-pay

## 2024-02-13 ENCOUNTER — Other Ambulatory Visit: Payer: Self-pay | Admitting: Family Medicine

## 2024-02-13 DIAGNOSIS — J302 Other seasonal allergic rhinitis: Secondary | ICD-10-CM

## 2024-02-21 ENCOUNTER — Other Ambulatory Visit: Payer: Self-pay | Admitting: Family Medicine

## 2024-02-21 DIAGNOSIS — E1151 Type 2 diabetes mellitus with diabetic peripheral angiopathy without gangrene: Secondary | ICD-10-CM

## 2024-03-03 ENCOUNTER — Ambulatory Visit: Payer: Self-pay | Admitting: *Deleted

## 2024-03-03 ENCOUNTER — Encounter: Payer: Self-pay | Admitting: Family Medicine

## 2024-03-03 NOTE — Telephone Encounter (Signed)
 Forwarding to Dr. Denyse Amass.

## 2024-03-03 NOTE — Telephone Encounter (Signed)
 Pt has appt tomorrow. Pt requesting refill on Tramadol . Last refilled by sports therapy.

## 2024-03-03 NOTE — Telephone Encounter (Signed)
  Chief Complaint: Patient is requesting a refill of her: traMADol  (ULTRAM ) 50 MG tablet - it has been pended already. Symptoms: Patient is also requesting a medication for day time pain. Patient has been advised she will need appointment   Disposition: [] ED /[] Urgent Care (no appt availability in office) / [x] Appointment(In office/virtual)/ []  [place] [facility]/ [] Home Care/ [] Refused Recommended Disposition /[] [place] [facility]/ []  Follow-up with PCP Additional Notes: Appointment has been scheduled- call forwarded to provider for review - patient uses interpreter    Copied from CRM [id number]. Topic: Clinical - Medication Question >> Mar 03, 2024 12:34 PM Shereese L wrote: Reason for CRM: patients needs a call back in reference to medication taken for pain Reason for Disposition . Prescription request for new medicine (not a refill)  Answer Assessment - Initial Assessment Questions 1. NAME of MEDICINE: "What medicine(s) are you calling about?"     Patient has requested a refill on her pain medication - traMADol  (ULTRAM ) 50 MG tablet 2. QUESTION: "What is your question?" (e.g., double dose of medicine, side effect)     Patient states she needs a medication she can take during the day- she states she has increased pain in groin and upper legs. 3. PRESCRIBER: "Who prescribed the medicine?" Reason: if prescribed by specialist, call should be referred to that group.     PCP 4. SYMPTOMS: "Do you have any symptoms?" If Yes, ask: "What symptoms are you having?"  "How bad are the symptoms (e.g., mild, moderate, severe)     Wants to discuss pain control during the day- patient advised she would need an appointment to discuss a new medication- she has scheduled an appointment.  Protocols used: Medication Question Call-A-AH

## 2024-03-04 ENCOUNTER — Ambulatory Visit: Admitting: Family Medicine

## 2024-03-04 ENCOUNTER — Encounter: Payer: Self-pay | Admitting: Family Medicine

## 2024-03-04 VITALS — BP 148/66 | HR 82 | Temp 98.2°F | Resp 18 | Ht 64.0 in | Wt 209.6 lb

## 2024-03-04 DIAGNOSIS — M160 Bilateral primary osteoarthritis of hip: Secondary | ICD-10-CM | POA: Diagnosis not present

## 2024-03-04 MED ORDER — TRAMADOL HCL 50 MG PO TABS: 50.0000 mg | ORAL_TABLET | Freq: Three times a day (TID) | ORAL | 0 refills | Status: DC | PRN

## 2024-03-04 MED ORDER — DICLOFENAC SODIUM 75 MG PO TBEC: 75.0000 mg | DELAYED_RELEASE_TABLET | Freq: Two times a day (BID) | ORAL | 0 refills | Status: DC

## 2024-03-04 NOTE — Progress Notes (Signed)
 Established Patient Office Visit  Subjective   Patient ID: Rhonda Morrow, female    DOB: 1961-08-20  Age: 63 y.o. MRN: [id number]  Chief Complaint  Patient presents with   Groin Pain    Onset: gradual    HPI Discussed the use of AI scribe software for clinical note transcription with the patient, who gave verbal consent to proceed.  History of Present Illness Rhonda Morrow is a 63 year old female who presents with right hip and groin pain.  She experiences significant pain in her right hip and groin, radiating down her leg, which has persisted for several months. The pain is similar to what she experienced before her left hip surgery. She has previously received injections for her hip pain.  She is currently prescribed tramadol  for pain management, which she takes at night due to its sedative effects. She is cautious about taking it during the day as it causes drowsiness and affects her ability to drive. She also takes Tylenol , but it does not alleviate her pain.  She is retired as of December, allowing her to manage her medication schedule more flexibly. She is concerned about the upcoming MRI, as she previously had difficulty with the procedure due to the size of the machine.   Patient Active Problem List   Diagnosis Date Noted   Irritable bowel syndrome with both constipation and diarrhea 07/20/2023   Urinary incontinence 01/31/2023   Vaginal itching 01/31/2023   Influenza 11/20/2022   Postmenopausal bleeding 08/18/2022   Acute non-recurrent pansinusitis 06/18/2022   Leg cramps 03/08/2022   Muscle spasm of right lower extremity 03/08/2022   Primary osteoarthritis of right hip 03/01/2022   ACE inhibitor intolerance 01/19/2022   Weakness 12/22/2021   Left lower quadrant abdominal pain 12/22/2021   Slow transit constipation 12/22/2021   Class 3 severe obesity due to excess calories without serious comorbidity with body mass index (BMI) of 40.0 to 44.9  in adult 07/05/2021   Restless leg syndrome 07/05/2021   Chronic seasonal allergic rhinitis due to pollen 07/05/2021   Psychophysiological insomnia 07/05/2021   Non-compliance 07/05/2021   COVID-19 07/05/2021   Right wrist pain 06/05/2021   Acute pain of left shoulder 01/24/2021   Acute pain of right shoulder 01/24/2021   Foot pain, bilateral 01/24/2021   Dyspepsia 01/24/2021   Preventative health care 01/24/2021   Right hand pain 12/29/2020   OSA (obstructive sleep apnea) 09/12/2020   Nocturia more than twice per night 08/03/2020   Non-restorative sleep 08/03/2020   Leg cramping 08/03/2020   Hyperlipidemia associated with type 2 diabetes mellitus (HCC) 07/25/2020   Large breasts 07/22/2020   RLS (restless legs syndrome) 05/17/2020   Cough 04/01/2020   Asthma, moderate persistent, poorly-controlled 04/01/2020   Medication management 04/01/2020   Menopausal symptoms 03/29/2020   Hot flashes 03/29/2020   Chronic right shoulder pain 08/11/2019   Acute non-recurrent maxillary sinusitis 08/11/2019   BMI 40.0-44.9, adult (HCC) 01/22/2019   Right lower quadrant abdominal pain 10/20/2018   Rectal pain 10/20/2018   Vitamin B 12 deficiency 03/28/2018   B12 deficiency 03/28/2018   DM (diabetes mellitus) type II uncontrolled, periph vascular disorder 03/28/2018   Hyperlipidemia 03/28/2018   Primary osteoarthritis of left hip 02/21/2017   Status post total hip replacement, left 02/21/2017   History of laparoscopic cholecystectomy 06/20/2016   Cerumen impaction 06/09/2016   Hypersomnia 05/16/2016   Knee pain, right 05/05/2016  RUQ pain 04/09/2016   Abnormal CT scan 03/28/2016   Dyspnea and respiratory abnormalities 03/15/2016   Asthma with acute exacerbation 03/15/2016   Asthma in adult 02/25/2016   DM (diabetes mellitus) type II controlled, neurological manifestation (HCC) 02/09/2016   Left hip pain 12/23/2015   Right hamstring muscle strain 11/18/2015   Abdominal pain, acute  09/01/2015   Acute asthma exacerbation 04/16/2015   Acute bronchitis 04/14/2015   Pap smear for cervical cancer screening 09/14/2014   Bed bug bite 05/06/2014   Allergic rhinitis 04/09/2014   Rectal itching 04/09/2014   Bladder spasm 04/09/2014   Knee pain, left 03/05/2014   Hypokalemia 02/19/2014   Nausea with vomiting 02/19/2014   Diabetes mellitus, type II (HCC) 02/19/2014   Edema 02/16/2014   Disorder of rotator cuff 11/12/2013   Routine general medical examination at a health care facility 08/20/2013   Gastroesophageal reflux disease 06/26/2013   Dysphagia, pharyngoesophageal phase 06/26/2013   Suprapubic abdominal pain 05/12/2013   Medication side effect 03/25/2013   Diarrhea 03/25/2013   Benign positional vertigo 01/30/2013   Traumatic hematoma of thigh 01/15/2013   HTN (hypertension) 09/02/2012   Dry skin 08/22/2012   External hemorrhoids 08/22/2012   Obesity 06/30/2012   Thyromegaly 05/22/2012   Back pain 05/22/2012   Elevated glucose 05/22/2012   Moderate persistent asthma 04/19/2012   Dyspnea 01/28/2012   Past Medical History:  Diagnosis Date   Arthritis    Asthma    Complication of anesthesia    one time woke up and was vey scared,17 yrs ago   Deaf    Diabetes mellitus without complication (HCC)    GERD (gastroesophageal reflux disease)    Hypertension    Left groin pain    Nausea with vomiting 02/19/2014   Thyroid  disease    Past Surgical History:  Procedure Laterality Date   CARDIAC CATHETERIZATION     CESAREAN SECTION     CHOLECYSTECTOMY N/A 06/20/2016   Procedure: LAPAROSCOPIC CHOLECYSTECTOMY;  Surgeon: Enid Harry, MD;  Location: [facility] OR;  Service: General;  Laterality: N/A;   ESOPHAGEAL MANOMETRY N/A 09/29/2013   Procedure: ESOPHAGEAL MANOMETRY (EM);  Surgeon: Janel Medford, MD;  Location: [facility]COPY;  Service: Endoscopy;  Laterality: N/A;   TOTAL HIP ARTHROPLASTY Left 02/21/2017   Procedure: LEFT TOTAL HIP ARTHROPLASTY ANTERIOR APPROACH;   Surgeon: Wes Hamman, MD;  Location: [facility] OR;  Service: Orthopedics;  Laterality: Left;   Social History   Tobacco Use   Smoking status: Never   Smokeless tobacco: Never  Vaping Use   Vaping status: Never Used  Substance Use Topics   Alcohol use: No    Alcohol/week: 0.0 standard drinks of alcohol   Drug use: No   Social History   Socioeconomic History   Marital status: Married    Spouse name: Not on file   Number of children: Not on file   Years of education: Not on file   Highest education level: Not on file  Occupational History   Occupation: disabled  Tobacco Use   Smoking status: Never   Smokeless tobacco: Never  Vaping Use   Vaping status: Never Used  Substance and Sexual Activity   Alcohol use: No    Alcohol/week: 0.0 standard drinks of alcohol   Drug use: No   Sexual activity: Not Currently  Other Topics Concern   Not on file  Social History Narrative   Lives with family.  Has 3 children.  Works for [employer]  Education: high school.  Social Drivers of Corporate investment banker Strain: Not on file  Food Insecurity: Not on file  Transportation Needs: Not on file  Physical Activity: Not on file  Stress: Not on file  Social Connections: Unknown (03/18/2022)   Received from [facility], [facility]   Social Network    Social Network: Not on file  Intimate Partner Violence: Unknown (02/08/2022)   Received from [facility], [facility]   HITS    Physically Hurt: Not on file    Insult or Talk Down To: Not on file    Threaten Physical Harm: Not on file    Scream or Curse: Not on file   Family Status  Relation Name Status   Mother  (Not Specified)   Father  (Not Specified)   Neg Hx  (Not Specified)  No partnership data on file   Family History  Problem Relation Age of Onset   Diabetes Mother    Heart disease Mother    Diabetes Father    Colon cancer Neg Hx    Colon polyps Neg Hx    Esophageal cancer Neg Hx    Rectal cancer Neg Hx     Stomach cancer Neg Hx    Allergies  Allergen Reactions   Losartan  Shortness Of Breath   Oxycodone -Acetaminophen  Nausea And Vomiting   Augmentin  [Amoxicillin -Pot Clavulanate] Diarrhea      Review of Systems  Constitutional:  Negative for fever and malaise/fatigue.  HENT:  Negative for congestion.   Eyes:  Negative for blurred vision.  Respiratory:  Negative for cough and shortness of breath.   Cardiovascular:  Negative for chest pain, palpitations and leg swelling.  Gastrointestinal:  Negative for vomiting.  Musculoskeletal:  Negative for back pain.  Skin:  Negative for rash.  Neurological:  Negative for loss of consciousness and headaches.      Objective:     BP (!) 148/66   Pulse 82   Temp 98.2 F (36.8 C)   Resp 18   Ht 5\' 4"  (1.626 m)   Wt 209 lb 9.6 oz (95.1 kg)   LMP 05/20/2012   SpO2 100%   BMI 35.98 kg/m  BP Readings from Last 3 Encounters:  03/06/24 130/84  03/04/24 (!) 148/66  02/05/24 138/70   Wt Readings from Last 3 Encounters:  03/06/24 210 lb 6.4 oz (95.4 kg)  03/04/24 209 lb 9.6 oz (95.1 kg)  02/05/24 209 lb (94.8 kg)   SpO2 Readings from Last 3 Encounters:  03/06/24 97%  03/04/24 100%  02/05/24 100%      Physical Exam Vitals and nursing note reviewed.  Constitutional:      General: She is not in acute distress.    Appearance: Normal appearance. She is well-developed.  HENT:     Head: Normocephalic and atraumatic.  Eyes:     General: No scleral icterus.       Right eye: No discharge.        Left eye: No discharge.  Cardiovascular:     Rate and Rhythm: Normal rate and regular rhythm.     Heart sounds: No murmur heard. Pulmonary:     Effort: Pulmonary effort is normal. No respiratory distress.     Breath sounds: Normal breath sounds.  Musculoskeletal:        General: Normal range of motion.     Cervical back: Normal range of motion and neck supple.     Right lower leg: No edema.     Left lower leg: No  edema.  Skin:    General:  Skin is warm and dry.  Neurological:     Mental Status: She is alert and oriented to person, place, and time.  Psychiatric:        Mood and Affect: Mood normal.        Behavior: Behavior normal.        Thought Content: Thought content normal.        Judgment: Judgment normal.      No results found for any visits on 03/04/24.  Last CBC Lab Results  Component Value Date   WBC 5.2 07/20/2023   HGB 14.1 07/20/2023   HCT 43.3 07/20/2023   MCV 84.7 07/20/2023   MCH 27.6 07/20/2023   RDW 13.2 07/20/2023   PLT 188 07/20/2023   Last metabolic panel Lab Results  Component Value Date   GLUCOSE 108 (H) 07/20/2023   NA 139 07/20/2023   K 3.4 (L) 07/20/2023   CL 100 07/20/2023   CO2 27 07/20/2023   BUN 14 07/20/2023   CREATININE 0.99 07/20/2023   GFR 61.04 12/14/2022   CALCIUM  9.5 07/20/2023   PHOS 3.0 03/07/2022   PROT 6.6 07/20/2023   ALBUMIN 3.8 12/14/2022   BILITOT 0.5 07/20/2023   ALKPHOS 63 12/14/2022   AST 16 07/20/2023   ALT 16 07/20/2023   ANIONGAP 13 05/24/2020   Last lipids Lab Results  Component Value Date   CHOL 113 07/20/2023   HDL 60 07/20/2023   LDLCALC 38 07/20/2023   TRIG 66 07/20/2023   CHOLHDL 1.9 07/20/2023   Last hemoglobin A1c Lab Results  Component Value Date   HGBA1C 6.7 (H) 07/20/2023   Last thyroid  functions Lab Results  Component Value Date   TSH 1.23 08/18/2022   T4TOTAL 10.7 12/22/2021   Last vitamin D  Lab Results  Component Value Date   VD25OH 52.50 02/08/2016   Last vitamin B12 and Folate Lab Results  Component Value Date   VITAMINB12 868 12/22/2021   FOLATE 19.2 05/14/2019      The ASCVD Risk score (Arnett DK, et al., 2019) failed to calculate for the following reasons:   The valid total cholesterol range is 130 to 320 mg/dL    Assessment & Plan:   Problem List Items Addressed This Visit   None Visit Diagnoses       Primary osteoarthritis of both hips    -  Primary   Relevant Medications   diclofenac   (VOLTAREN ) 75 MG EC tablet     Assessment and Plan Assessment & Plan Right hip pain   Chronic right hip pain radiates to the groin, indicating hip pathology. The pain is severe and impacts daily activities. Her history of left hip surgery suggests a similar issue on the right side. An MRI is scheduled to evaluate the need for surgery. Tramadol  is prescribed for pain relief at night, with a warning against driving due to its sedative effects. Diclofenac  is prescribed for daytime use as it is stronger than ibuprofen  and non-sedating. Ice application is advised to reduce inflammation and pain. If diclofenac  is insufficient, consider halving tramadol  for daytime use with a pill cutter. Await MRI results to decide on further management, including possible surgery.  Left hip surgery   Left hip surgery was successful. Current right hip symptoms mirror those experienced before the left hip surgery.  F/u orthopedics   No follow-ups on file.    Laquanna Veazey R Lowne Chase, DO

## 2024-03-06 ENCOUNTER — Ambulatory Visit

## 2024-03-06 ENCOUNTER — Ambulatory Visit: Admitting: Adult Health

## 2024-03-06 ENCOUNTER — Encounter: Payer: Self-pay | Admitting: Adult Health

## 2024-03-06 ENCOUNTER — Ambulatory Visit: Payer: Self-pay | Admitting: *Deleted

## 2024-03-06 VITALS — BP 130/84 | HR 60 | Temp 97.8°F | Ht 64.0 in | Wt 210.4 lb

## 2024-03-06 DIAGNOSIS — J4530 Mild persistent asthma, uncomplicated: Secondary | ICD-10-CM | POA: Diagnosis not present

## 2024-03-06 DIAGNOSIS — J309 Allergic rhinitis, unspecified: Secondary | ICD-10-CM

## 2024-03-06 DIAGNOSIS — J4540 Moderate persistent asthma, uncomplicated: Secondary | ICD-10-CM

## 2024-03-06 DIAGNOSIS — J45909 Unspecified asthma, uncomplicated: Secondary | ICD-10-CM | POA: Diagnosis not present

## 2024-03-06 DIAGNOSIS — J301 Allergic rhinitis due to pollen: Secondary | ICD-10-CM

## 2024-03-06 DIAGNOSIS — R059 Cough, unspecified: Secondary | ICD-10-CM | POA: Diagnosis not present

## 2024-03-06 MED ORDER — TRELEGY ELLIPTA 100-62.5-25 MCG/ACT IN AEPB
1.0000 | INHALATION_SPRAY | Freq: Every day | RESPIRATORY_TRACT | 5 refills | Status: DC
Start: 2024-03-06 — End: 2024-03-13

## 2024-03-06 NOTE — Progress Notes (Signed)
 @Patient  ID: Rhonda Morrow, female    DOB: [birth year], 63 y.o.   MRN: [id number]  Chief Complaint  Patient presents with   Follow-up    Referring provider: Estill Hemming, *  HPI: 63 year old female never smoker followed for asthma and allergic rhinitis Patient is deaf and mute  TEST/EVENTS :  PFt date 01/24/12 shows mixed obstruction - restriction but greater restriction with low dlco   - fev1 1/48L/58%, Ratio 82, TLC 56,  DLCO 14/51%, No BD response   CT chest 02/09/2012 showed no evidence of scarring   Chest xray 11/2022 Bibasilar atlectasis     03/06/2024 Follow up : Asthma and AR (interpreter present for today's visit)  Patient returns for a 76-month follow-up.  She feels that her asthma has not been as well-controlled.  Feels that Breztri  does not work very well for her.  It actually causes her to cough and makes her asthma worse.  She also complains that her nose is stuffed up and that also causes it to be hard for her to breathe.  She is currently using Zyrtec .  She denies any fever, chest pain, orthopnea or edema.  She remains active.  Allergies  Allergen Reactions   Losartan  Shortness Of Breath   Oxycodone -Acetaminophen  Nausea And Vomiting   Augmentin  [Amoxicillin -Pot Clavulanate] Diarrhea    Immunization History  Administered Date(s) Administered   DT (Pediatric) 04/07/2011   Influenza Split 08/07/2011, 08/05/2012, 08/22/2012, 08/31/2014   Influenza, Seasonal, Injecte, Preservative Fre 07/18/2023   Influenza,inj,Quad PF,6+ Mos 08/20/2013, 10/22/2015, 08/01/2016, 09/11/2017, 08/06/2018, 07/15/2019, 07/22/2020, 07/18/2021, 08/18/2022   Influenza,trivalent, recombinat, inj, PF 12/18/2011   PFIZER(Purple Top)SARS-COV-2 Vaccination 12/28/2019, 01/20/2020, 08/23/2020   Pfizer Covid-19 Vaccine Bivalent Booster 74yrs & up 10/11/2021   Pfizer(Comirnaty )Fall Seasonal Vaccine 12 years and older 09/19/2022, 07/18/2023   Pneumococcal Polysaccharide-23 10/02/2012,  07/22/2020   Tdap 12/18/2011   Zoster Recombinant(Shingrix ) 08/17/2020, 10/19/2020, 07/20/2023    Past Medical History:  Diagnosis Date   Arthritis    Asthma    Complication of anesthesia    one time woke up and was vey scared,17 yrs ago   Deaf    Diabetes mellitus without complication (HCC)    GERD (gastroesophageal reflux disease)    Hypertension    Left groin pain    Nausea with vomiting 02/19/2014   Thyroid  disease     Tobacco History: Social History   Tobacco Use  Smoking Status Never  Smokeless Tobacco Never   Counseling given: Not Answered   Outpatient Medications Prior to Visit  Medication Sig Dispense Refill   albuterol  (VENTOLIN  HFA) 108 (90 Base) MCG/ACT inhaler Inhale 1-2 puffs into the lungs every 6 (six) hours as needed for wheezing or shortness of breath. J45.40 1 each 3   allopurinol  (ZYLOPRIM ) 100 MG tablet TAKE 1 TABLET BY MOUTH EVERY DAY (Patient taking differently: as needed.) 90 tablet 3   amLODipine  (NORVASC ) 5 MG tablet TAKE 1 TABLET (5 MG TOTAL) BY MOUTH DAILY. 90 tablet 1   atorvastatin  (LIPITOR) 20 MG tablet Take 1 tablet (20 mg total) by mouth daily. 30 tablet 2   Azelastine  HCl 137 MCG/SPRAY SOLN PLACE 2 SPRAYS INTO BOTH NOSTRILS 2 (TWO) TIMES DAILY. USE IN EACH NOSTRIL AS DIRECTED (Patient taking differently: as needed.) 30 mL 3   benzonatate  (TESSALON ) 200 MG capsule Take 1 capsule (200 mg total) by mouth 2 (two) times daily as needed for cough. 20 capsule 0   blood glucose meter kit and supplies KIT Dispense based on patient  and insurance preference. Use up to four times daily as directed. (FOR ICD-9 250.00, 250.01). 1 each 0   cetirizine  (ZYRTEC ) 10 MG tablet TAKE 1 TABLET BY MOUTH EVERY DAY 90 tablet 2   cyclobenzaprine  (FLEXERIL ) 10 MG tablet Take 1 tablet (10 mg total) by mouth 3 (three) times daily as needed for muscle spasms. 30 tablet 0   diclofenac  (VOLTAREN ) 75 MG EC tablet Take 1 tablet (75 mg total) by mouth 2 (two) times daily. 60  tablet 0   fluticasone  (FLONASE ) 50 MCG/ACT nasal spray Place 2 sprays into both nostrils daily. 16 g 6   fluticasone  (FLONASE ) 50 MCG/ACT nasal spray Place 2 sprays into both nostrils daily. 16 g 6   lansoprazole (PREVACID) 30 MG capsule Take 1 capsule (30 mg total) by mouth daily at 12 noon. 90 capsule 1   LORazepam  (ATIVAN ) 0.5 MG tablet 1-2 tabs 30 - 60 min prior to MRI. Do not drive with this medicine. 4 tablet 0   metFORMIN  (GLUCOPHAGE -XR) 500 MG 24 hr tablet TAKE 2 TABLETS BY MOUTH EVERY DAY WITH BREAKFAST 180 tablet 1   promethazine -dextromethorphan (PROMETHAZINE -DM) 6.25-15 MG/5ML syrup Take 5 mLs by mouth 4 (four) times daily as needed. 118 mL 0   Semaglutide  (RYBELSUS ) 7 MG TABS Take 1 tablet (7 mg total) by mouth daily. 90 tablet 1   solifenacin  (VESICARE ) 5 MG tablet Take 1 tablet (5 mg total) by mouth daily. 90 tablet 1   terconazole  (TERAZOL 7 ) 0.4 % vaginal cream PLACE 1 APPLICATOR VAGINALLY AT BEDTIME. 45 g 0   traMADol  (ULTRAM ) 50 MG tablet Take 1 tablet (50 mg total) by mouth every 8 (eight) hours as needed for severe pain (pain score 7-10). 15 tablet 0   Budeson-Glycopyrrol-Formoterol  (BREZTRI  AEROSPHERE) 160-9-4.8 MCG/ACT AERO Inhale 2 puffs into the lungs in the morning and at bedtime. 10.7 g 0   Budeson-Glycopyrrol-Formoterol  (BREZTRI  AEROSPHERE) 160-9-4.8 MCG/ACT AERO Inhale 2 puffs into the lungs in the morning and at bedtime. DX J45.40 10.7 each 11   Facility-Administered Medications Prior to Visit  Medication Dose Route Frequency Provider Last Rate Last Admin   alum & mag hydroxide-simeth (MAALOX/MYLANTA) 200-200-20 MG/5ML suspension 30 mL  30 mL Oral Once Saguier, Edward, PA-C       lidocaine  (XYLOCAINE ) 2 % viscous mouth solution 15 mL  15 mL Mouth/Throat Once Saguier, Edward, PA-C       nitroGLYCERIN  (NITRODUR - Dosed in mg/24 hr) patch 0.2 mg  0.2 mg Transdermal Daily Brandt Cake, DPM         Review of Systems:   Constitutional:   No  weight loss, night  sweats,  Fevers, chills, fatigue, or  lassitude.  HEENT:   No headaches,  Difficulty swallowing,  Tooth/dental problems, or  Sore throat,                No sneezing, itching, ear ache,+ nasal congestion, post nasal drip,   CV:  No chest pain,  Orthopnea, PND, swelling in lower extremities, anasarca, dizziness, palpitations, syncope.   GI  No heartburn, indigestion, abdominal pain, nausea, vomiting, diarrhea, change in bowel habits, loss of appetite, bloody stools.   Resp: No chest wall deformity  Skin: no rash or lesions.  GU: no dysuria, change in color of urine, no urgency or frequency.  No flank pain, no hematuria   MS:  No joint pain or swelling.  No decreased range of motion.  No back pain.    Physical Exam  BP 130/84 (BP  Location: Left Arm, Patient Position: Sitting, Cuff Size: Large)   Pulse 60   Temp 97.8 F (36.6 C) (Oral)   Ht 5\' 4"  (1.626 m)   Wt 210 lb 6.4 oz (95.4 kg)   LMP 05/20/2012   SpO2 97%   BMI 36.12 kg/m   GEN: A/Ox3; pleasant , NAD, well nourished    HEENT:  [place]/AT,  NOSE-clear, THROAT-clear, no lesions, no postnasal drip or exudate noted.   NECK:  Supple w/ fair ROM; no JVD; normal carotid impulses w/o bruits; no thyromegaly or nodules palpated; no lymphadenopathy.    RESP  Clear  P & A; w/o, wheezes/ rales/ or rhonchi. no accessory muscle use, no dullness to percussion  CARD:  RRR, no m/r/g, no peripheral edema, pulses intact, no cyanosis or clubbing.  GI:   Soft & nt; nml bowel sounds; no organomegaly or masses detected.   Musco: Warm bil, no deformities or joint swelling noted.   Neuro: alert, no focal deficits noted.    Skin: Warm, no lesions or rashes    Lab Results:  CBC   BNP   Imaging: No results found.  Administration History     None          Latest Ref Rng & Units 01/22/2019    2:35 PM 07/21/2013   12:03 PM  PFT Results  FVC-Pre L 1.60    FVC-Predicted Pre % 61  56      FVC-Post L 1.66    FVC-Predicted Post %  63    Pre FEV1/FVC % % 89  89      Post FEV1/FCV % % 88    FEV1-Pre L 1.42  1.37      FEV1-Predicted Pre % 69  62      FEV1-Post L 1.45    DLCO uncorrected ml/min/mmHg 17.81    DLCO UNC% % 91    DLCO corrected ml/min/mmHg 17.03    DLCO COR %Predicted % 87    DLVA Predicted % 148    TLC L 3.13    TLC % Predicted % 65    RV % Predicted % 65       This result is from an external source.    No results found for: "NITRICOXIDE"      Assessment & Plan:   Moderate persistent asthma Moderate persistent asthma-no perceived benefit on Breztri . Will change over to Trelegy 1 puff daily.  Check chest x-ray today.  Maximize chronic rhinitis treatment.  Plan  Patient Instructions  Stop Breztri .  Begin Trelegy 1 puff daily, Rinse after use  Albuterol  inhaler As needed   Chest xray today.  Continue on Zyrtec  10mg  At bedtime   Restart Flonase  2 puffs daily  Follow up in Dr. Marygrace Snellen or Jonathen Rathman NP in 3-4 months and As needed  -30 min slot.  Please contact office for sooner follow up if symptoms do not improve or worsen or seek emergency care        Allergic rhinitis Maximize therapy.  Continue on Zyrtec  daily.  Add Flonase  daily.  Plan  Patient Instructions  Stop Breztri .  Begin Trelegy 1 puff daily, Rinse after use  Albuterol  inhaler As needed   Chest xray today.  Continue on Zyrtec  10mg  At bedtime   Restart Flonase  2 puffs daily  Follow up in Dr. Marygrace Snellen or Anevay Campanella NP in 3-4 months and As needed  -30 min slot.  Please contact office for sooner follow up if symptoms do not improve or worsen or seek  emergency care          Rhonda Clark, NP 03/06/2024

## 2024-03-06 NOTE — Assessment & Plan Note (Signed)
 Moderate persistent asthma-no perceived benefit on Breztri . Will change over to Trelegy 1 puff daily.  Check chest x-ray today.  Maximize chronic rhinitis treatment.  Plan  Patient Instructions  Stop Breztri .  Begin Trelegy 1 puff daily, Rinse after use  Albuterol  inhaler As needed   Chest xray today.  Continue on Zyrtec  10mg  At bedtime   Restart Flonase  2 puffs daily  Follow up in Dr. Marygrace Snellen or Gearldene Fiorenza NP in 3-4 months and As needed  -30 min slot.  Please contact office for sooner follow up if symptoms do not improve or worsen or seek emergency care

## 2024-03-06 NOTE — Telephone Encounter (Addendum)
 Copied from CRM [id number]. Topic: Clinical - Red Word Triage >> Mar 06, 2024 12:04 PM Melissa C wrote: Red Word that prompted transfer to Nurse Triage: patient called in in behalf of herself and her husband regarding rescheduling appointments. Spoke with patient's interpreter and patient seemed more concerned with patient's husband (whom red word triage has been sent for), however when I asked if I could place her on hold she stated that she had back pain so I wanted to be sure she had a red word triage in as well. Patient herself is currently only in for pulmonary and and I think she was confused when calling in because she wanted to get her appointment on the same day as her husband, however she may need an appointment if she is experiencing back pain. Please use interpreter services to advise with patient as to the nature of her appointments (pulmonary vs. Primary) and see if she does indeed need primary care appointment.(Note for scheduler- patient may speak as though she is scheduling for herself when she is actually scheduling for her husband, I have also already sent over a red word for him) Thank you. Answer Assessment - Initial Assessment Questions 1. ONSET: "When did the pain begin?"      I returned her call using the sign language interpeter.  She said Rhonda Morrow 04/18/1948.    I asked who she was calling about. Rhonda Morrow is at the doctor.  It ended up Rhonda Morrow answered the phone and was wanting to talk with someone about his back pain.    I let him know I needed to change charts because I was in his wives chart. So no triage done for Rhonda Morrow.   2. LOCATION: "Where does it hurt?" (upper, mid or lower back)     *No Answer* 3. SEVERITY: "How bad is the pain?"  (e.g., Scale 1-10; mild, moderate, or severe)   - MILD (1-3): Doesn't interfere with normal activities.    - MODERATE (4-7): Interferes with normal activities or awakens from sleep.    - SEVERE (8-10): Excruciating pain, unable to do  any normal activities.      *No Answer* 4. PATTERN: "Is the pain constant?" (e.g., yes, no; constant, intermittent)      *No Answer* 5. RADIATION: "Does the pain shoot into your legs or somewhere else?"     *No Answer* 6. CAUSE:  "What do you think is causing the back pain?"      *No Answer* 7. BACK OVERUSE:  "Any recent lifting of heavy objects, strenuous work or exercise?"     *No Answer* 8. MEDICINES: "What have you taken so far for the pain?" (e.g., nothing, acetaminophen , NSAIDS)     *No Answer* 9. NEUROLOGIC SYMPTOMS: "Do you have any weakness, numbness, or problems with bowel/bladder control?"     *No Answer* 10. OTHER SYMPTOMS: "Do you have any other symptoms?" (e.g., fever, abdomen pain, burning with urination, blood in urine)       *No Answer* 11. PREGNANCY: "Is there any chance you are pregnant?" "When was your last menstrual period?"       *No Answer*  Protocols used: Back Pain-A-AH Late entry:  Using the sign language interpreter I switched charts into her husband's chart Rhonda Morrow since it was mentioned he was having back pain during his original call in and per the note from the agent.   Rhonda Morrow said Rhonda Morrow was at the doctor's right now.  So no triage done nor did I  speak with Rhonda Morrow since she was not there but I did do a partial triage on her husband for back pain.   See his chart.

## 2024-03-06 NOTE — Patient Instructions (Addendum)
 Stop Breztri .  Begin Trelegy 1 puff daily, Rinse after use  Albuterol  inhaler As needed   Chest xray today.  Continue on Zyrtec  10mg  At bedtime   Restart Flonase  2 puffs daily  Follow up in Dr. Marygrace Snellen or Dolores Mcgovern NP in 3-4 months and As needed  -30 min slot.  Please contact office for sooner follow up if symptoms do not improve or worsen or seek emergency care

## 2024-03-06 NOTE — Progress Notes (Signed)
 Patient seen in the office today and instructed on use of Trelegy.  Patient expressed understanding and demonstrated technique.

## 2024-03-06 NOTE — Assessment & Plan Note (Signed)
 Maximize therapy.  Continue on Zyrtec  daily.  Add Flonase  daily.  Plan  Patient Instructions  Stop Breztri .  Begin Trelegy 1 puff daily, Rinse after use  Albuterol  inhaler As needed   Chest xray today.  Continue on Zyrtec  10mg  At bedtime   Restart Flonase  2 puffs daily  Follow up in Dr. Marygrace Snellen or Rachal Dvorsky NP in 3-4 months and As needed  -30 min slot.  Please contact office for sooner follow up if symptoms do not improve or worsen or seek emergency care

## 2024-03-07 ENCOUNTER — Ambulatory Visit
Admission: RE | Admit: 2024-03-07 | Discharge: 2024-03-07 | Disposition: A | Discharge status: Home / Self Care | Source: Ambulatory Visit | Attending: Family Medicine | Admitting: Family Medicine

## 2024-03-07 DIAGNOSIS — M25551 Pain in right hip: Secondary | ICD-10-CM | POA: Diagnosis not present

## 2024-03-07 DIAGNOSIS — Z96641 Presence of right artificial hip joint: Secondary | ICD-10-CM | POA: Diagnosis not present

## 2024-03-07 DIAGNOSIS — R936 Abnormal findings on diagnostic imaging of limbs: Secondary | ICD-10-CM | POA: Diagnosis not present

## 2024-03-07 DIAGNOSIS — G8929 Other chronic pain: Secondary | ICD-10-CM | POA: Diagnosis not present

## 2024-03-08 ENCOUNTER — Encounter: Payer: Self-pay | Admitting: Family Medicine

## 2024-03-08 ENCOUNTER — Other Ambulatory Visit: Payer: Self-pay | Admitting: Family Medicine

## 2024-03-08 NOTE — Patient Instructions (Signed)
 Hip Pain The hip is the joint between the upper legs and the lower pelvis. The bones, cartilage, tendons, and muscles of your hip joint support your body and allow you to move around. Hip pain can range from a minor ache to severe pain in one or both of your hips. The pain may be felt on the inside of the hip joint near the groin, or on the outside near the buttocks and upper thigh. You may also have swelling or stiffness in your hip area. Follow these instructions at home: Managing pain, stiffness, and swelling     If told, put ice on the painful area. Put ice in a plastic bag. Place a towel between your skin and the bag. Leave the ice on for 20 minutes, 2-3 times a day. If told, apply heat to the affected area as often as told by your health care provider. Use the heat source that your provider recommends, such as a moist heat pack or a heating pad. Place a towel between your skin and the heat source. Leave the heat on for 20-30 minutes. If your skin turns bright red, remove the ice or heat right away to prevent skin damage. The risk of damage is higher if you cannot feel pain, heat, or cold. Activity Do exercises as told by your provider. Avoid activities that cause pain. General instructions  Take over-the-counter and prescription medicines only as told by your provider. Keep a journal of your symptoms. Write down: How often you have hip pain. The location of your pain. What the pain feels like. What makes the pain worse. Sleep with a pillow between your legs on your most comfortable side. Keep all follow-up visits. Your provider will monitor your pain and activity. Contact a health care provider if: You cannot put weight on your leg. Your pain or swelling gets worse after a week. It gets harder to walk. You have a fever. Get help right away if: You fall. You have a sudden increase in pain and swelling in your hip. Your hip is red or swollen or very tender to touch. This  information is not intended to replace advice given to you by your health care provider. Make sure you discuss any questions you have with your health care provider. Document Revised: 06/27/2022 Document Reviewed: 06/27/2022 Elsevier Patient Education  2024 [employer].

## 2024-03-11 ENCOUNTER — Encounter: Payer: Self-pay | Admitting: *Deleted

## 2024-03-11 ENCOUNTER — Telehealth: Payer: Self-pay | Admitting: *Deleted

## 2024-03-11 NOTE — Telephone Encounter (Signed)
 Copied from CRM [id number]. Topic: Clinical - Prescription Issue >> Mar 11, 2024 11:16 AM Rhonda Morrow wrote: Reason for CRM: Pt stated her pharmacy stated her Fluticasone -Umeclidin-Vilant (TRELEGY ELLIPTA ) 100-62.5-25 MCG/ACT AEPB was going to be $600 per month, however they mentioned there was a possible coupon to reduce the cost of the medication. Please reach out to the patient through MyChart.  I have reached out to the pt via mychart-offered to mail her a coupon  Will await her response from the mychart msg.

## 2024-03-13 ENCOUNTER — Telehealth: Payer: Self-pay

## 2024-03-13 MED ORDER — TRELEGY ELLIPTA 100-62.5-25 MCG/ACT IN AEPB: 1.0000 | INHALATION_SPRAY | Freq: Every day | RESPIRATORY_TRACT | 5 refills | Status: DC

## 2024-03-13 NOTE — Telephone Encounter (Signed)
 Pt walked into the office and I provided her with coupon for trelegy. Nothing further needed.

## 2024-03-13 NOTE — Telephone Encounter (Signed)
 Copied from CRM [id number]. Topic: Clinical - Prescription Issue >> Mar 13, 2024 12:06 PM Isabell A wrote: Reason for CRM: Patient is asking for the office to call Trelegy to provide proof - states she received a coupon for a discount. Patient states this is urgent, she is almost out of her inhaler. >> Mar 13, 2024  3:49 PM Ilene Malling wrote: Patient [phone] has a sign Presenter, broadcasting for this call, patient is deaf. Patient was speaking with a nurse and it seem the nurse was frustrated when patient was trying to communicate about Fluticasone -Umeclidin-Vilant (TRELEGY ELLIPTA ) 100-62.5-25 MCG/ACT AEPB is too expensive to fill at [facility]. Patient states is hard to understand through MyChart, and that's why she calling back with an interpreter. Patient is asking to have the medication sent to [facility] [address] [place] - [address] [place] [address] Phone: [phone] Fax: [phone]. Please call back, there will be an interpreter to help.  I called the interpretor- ID # [id number] to translate for me. She did connect with pt. I verified with pt ( through interpretor) that the RX for Trelegy just needed to be sent to [facility]. Pt agreed as this would be cheaper through this pharmacy. I informed that I would send RX to [facility] for her. Pt was notified. NFN

## 2024-03-17 ENCOUNTER — Encounter: Payer: Self-pay | Admitting: Family Medicine

## 2024-03-17 ENCOUNTER — Ambulatory Visit (INDEPENDENT_AMBULATORY_CARE_PROVIDER_SITE_OTHER): Admitting: Family Medicine

## 2024-03-17 VITALS — BP 112/82 | HR 66 | Ht 64.0 in | Wt 211.0 lb

## 2024-03-17 DIAGNOSIS — M1611 Unilateral primary osteoarthritis, right hip: Secondary | ICD-10-CM

## 2024-03-17 DIAGNOSIS — M25551 Pain in right hip: Secondary | ICD-10-CM

## 2024-03-17 DIAGNOSIS — G8929 Other chronic pain: Secondary | ICD-10-CM | POA: Diagnosis not present

## 2024-03-17 MED ORDER — TRAMADOL HCL 50 MG PO TABS: 50.0000 mg | ORAL_TABLET | Freq: Three times a day (TID) | ORAL | 0 refills | Status: DC | PRN

## 2024-03-17 NOTE — Progress Notes (Signed)
 I, Miquel Amen, CMA acting as a scribe for Garlan Juniper, MD.  Rhonda Morrow is a 63 y.o. female who presents to [facility] today for review of MRI right hip. Pt was last seen by Dr. Alease Hunter on 12/17/23, had XR of the hip and MRI order was placed. Tramadol  was prescribed for pain. Pt reported only a few weeks of sx relief s/p steroid injection on 11/22/23.    Pertinent review of systems: No fevers or chills  Relevant historical information: Hypertension.  Diabetes.  Deaf.   Exam:  BP 112/82   Pulse 66   Ht 5\' 4"  (1.626 m)   Wt 211 lb (95.7 kg)   LMP 05/20/2012   SpO2 99%   BMI 36.22 kg/m  General: Well Developed, well nourished, and in no acute distress.   MSK: Right hip decreased range of motion.    Lab and Radiology Results   EXAM: MR OF THE RIGHT HIP WITHOUT CONTRAST   TECHNIQUE: Multiplanar, multisequence MR imaging was performed. No intravenous contrast was administered.   COMPARISON:  Pelvis and right hip radiographs 12/17/2023, 04/03/2023; CT abdomen and pelvis 12/23/2022   FINDINGS: Bones: There is metallic susceptibility artifact from total left hip arthroplasty hardware. This obscures portions of the left hemipelvis and the majority of the proximal left femur. Additional artifact at the level of the superior aspect of the prior lateral iliac crests.   There is decreased T1 and increased T2 signal high-grade marrow edema throughout the anterior greater than posterior aspect of the right femoral head, greatest within the anterior superior quadrant. There are curvilinear regions of decreased T1 signal within the anterior superior (coronal images 18 through 20 of 28) greater than the posterosuperior (coronal images 15 and 16) portions of the right femoral head on the most recent 12/17/2023 radiographs, there may be minimal cortical flattening and less than 1 mm cortical depression of portions of the anterior superior right  femoral head best seen on lateral radiograph, new from 04/03/2023 lateral radiograph. Therefore, the right femoral head marrow edema and curvilinear signal may be secondary to mild acute to subacute subchondral insufficiency fractures. Additional high-grade anterior superior right acetabular decreased T1 increased T2 signal marrow edema with some curvilinear components (coronal series 4 images 19 and 20).   Mild pubic symphysis joint space narrowing and superior degenerative spurring.   Articular cartilage and labrum   Right hip:   Articular cartilage: High-grade partial to full-thickness cartilage loss throughout the anterior superior right femoroacetabular joint.   Labrum: Normal morphology of the right femoral head-neck junction without CAM-type bump deformity. Lack of intra-articular fluid limits evaluation of the right acetabular labrum. No large, discrete right acetabular labral tear is seen.   Joint or bursal effusion   Joint effusion: No right hip joint effusion. 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   Bursae: No fluid within the right trochanteric bursa. No gross fluid within the left trochanteric bursa, within the limitations of metallic artifact.   Muscles and tendons   Muscles and tendons: The right rectus femoris tendon origin is intact. Within the limitations of metallic artifact, the left rectus femoris tendon origin appears intact. There is fluid bright signal at the posterior aspect of the right common hamstring tendon origin (axial series 5 images 27-29) and coronal series 6 images 12 through 14) suggesting a moderate to partial-thickness tear at the conjoined semitendinosis and long head of the biceps femoris tendon origin at the lateral aspect  of this tear there is up to approximately 1 cm tendon fiber retraction. The left common hamstring tendon origin is intact.   The insertion of the bilateral rectus femoris and right  gluteus minimus and medius tendons are intact. Artifact obscures the left gluteus minimus and medius tendon insertions.   The rectus femoris-adductor aponeuroses are intact.   Other findings   Miscellaneous: No gross abnormality is seen within the intrapelvic soft tissues.   IMPRESSION: 1. High-grade marrow edema throughout the anterior greater than posterior aspect of the right femoral head, greatest within the anterior superior quadrant. Possible small curvilinear subchondral fracture lines within the anterior superior greater than posterosuperior right femoral head, and there may be minimal cortical flattening/depression on 12/17/2023 radiographs lateral radiograph, new from 04/03/2023 lateral radiograph. Therefore, the right femoral head marrow edema and curvilinear signal may be secondary to mild acute to subacute subchondral insufficiency fractures. Developing underlying anterior superior right femoral head avascular necrosis is possible, however no "serpiginous" shaped lesion is seen to specifically suggest MRI evidence of avascular necrosis. 2. Additional high-grade anterior superior right acetabular marrow edema with some curvilinear components. This may be secondary to high-grade degenerative subchondral marrow edema, with a possible a small subchondral insufficiency fracture. 3. High-grade partial to full-thickness cartilage loss throughout the anterior superior right femoroacetabular joint. 4. Moderate partial-thickness tear at the posterior aspect of the right common hamstring tendon origin at the conjoined semitendinosis and long head of the biceps femoris tendon origin. 5. 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     Electronically Signed   By: Bertina Broccoli M.D.   On: 03/17/2024 10:20  I, Garlan Juniper, personally (independently) visualized and performed the interpretation of the images attached in this note.    Assessment and  Plan: 63 y.o. 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  She has significant cartilage loss on this MRI which was not as easily apparent on the right hip x-ray from February 2025.  At this point the only realistic option is a total hip replacement.  She is already had a left total hip replacement with Dr. Christiane Cowing.  Plan to refer back to Dr. Christiane Cowing for evaluation right total hip replacement.  Tramadol  refilled. Reviewed MRI findings and images with the patient.  Sign language interpreter used for today's visit.   PDMP reviewed during this encounter. Orders Placed This Encounter  Procedures   Ambulatory referral to Orthopedic Surgery    Referral Priority:   Routine    Referral Type:   Surgical    Referral Reason:   Specialty Services Required    Referred to Provider:   Wes Hamman, MD    Requested Specialty:   Orthopedic Surgery    Number of Visits Requested:   1   Meds ordered this encounter  Medications   traMADol  (ULTRAM ) 50 MG tablet    Sig: Take 1 tablet (50 mg total) by mouth every 8 (eight) hours as needed for severe pain (pain score 7-10).    Dispense:  15 tablet    Refill:  0     Discussed warning signs or symptoms. Please see discharge instructions. Patient expresses understanding.   The above documentation has been reviewed and is accurate and complete Garlan Juniper, M.D.

## 2024-03-17 NOTE — Patient Instructions (Addendum)
 Thank you for coming in today.   You were correct, there has been a 2 lb weight gain since your last visit.   I've referred you to Orthopedic Surgery.  Let us  know if you don't hear from them in one week.

## 2024-03-17 NOTE — Telephone Encounter (Signed)
 Called the Radiology Reading Room to get a read on pt's MRI, so we had the results for her upcoming appointment today at 11:30. I was told they would assign a radiologist to read this MRI and escalate it to STAT.

## 2024-03-18 ENCOUNTER — Ambulatory Visit: Payer: Self-pay | Admitting: Family Medicine

## 2024-03-18 NOTE — Progress Notes (Signed)
 MRI reviewed with patient in the room.  Patient has significant abnormalities of the hip joint that will cause the pain that she is experiencing.  Already referred to orthopedic surgery to consider hip replacement.

## 2024-03-25 ENCOUNTER — Telehealth: Payer: Self-pay

## 2024-03-25 ENCOUNTER — Other Ambulatory Visit (INDEPENDENT_AMBULATORY_CARE_PROVIDER_SITE_OTHER)

## 2024-03-25 ENCOUNTER — Encounter: Payer: Self-pay | Admitting: Orthopaedic Surgery

## 2024-03-25 ENCOUNTER — Ambulatory Visit (INDEPENDENT_AMBULATORY_CARE_PROVIDER_SITE_OTHER): Admitting: Orthopaedic Surgery

## 2024-03-25 VITALS — Ht 64.0 in | Wt 209.0 lb

## 2024-03-25 DIAGNOSIS — M1611 Unilateral primary osteoarthritis, right hip: Secondary | ICD-10-CM

## 2024-03-25 NOTE — Progress Notes (Signed)
 Office Visit Note   Patient: Rhonda Morrow           Date of Birth: 1961/05/12           MRN: [id number] Visit Date: 03/25/2024              Requested by: Syliva Even, MD [address] [place],  [place] [address] PCP: Estill Hemming, DO   Assessment & Plan: Visit Diagnoses:  1. Primary osteoarthritis of right hip     Plan: History of Present Illness Rhonda Morrow is a 63 year old female with a history of left hip replacement who presents with right hip pain.  She experiences severe right hip pain, primarily in the groin area, which affects her sleep quality.  A hip injection in January 2025 did not alleviate her symptoms. Tylenol  is ineffective and causes dizziness. She is currently taking tramadol  and inquires about increasing the dose to two tablets at night to help with sleep.  Results LABS A1c: 6 (03/11/2024)  Examination of the right hip shows pain with hip flexion and internal rotation.  No trochanteric tenderness.  Skin is intact.  Assessment and Plan Right hip osteoarthritis Chronic osteoarthritis with severe pain. Imaging confirms bone on bone changes.  Previous injection ineffective. Tylenol  ineffective and causes dizziness. Tramadol  used for pain. Considering hip replacement due to pain severity. - Increase tramadol  to two tablets at night. - Proceed with right hip replacement after medical clearance. - Provide clearance sheet for primary care physician. - Detailed surgical plan discussed.  Type 2 diabetes mellitus Recent A1c of 6.0. Diabetes management crucial for surgery. - Ensure diabetes is well-controlled prior to surgery.  Hypertension Blood pressure control essential for surgery. - Consult with primary care physician to ensure blood pressure is well-controlled prior to surgery.  Follow-Up Instructions: No follow-ups on file.   Orders:  Orders Placed This Encounter  Procedures   XR Pelvis 1-2 Views   No orders of the defined types were  placed in this encounter.    Subjective: Chief Complaint  Patient presents with   Right Hip - Pain    HPI  Review of Systems  Constitutional: Negative.   HENT: Negative.    Eyes: Negative.   Respiratory: Negative.    Cardiovascular: Negative.   Endocrine: Negative.   Musculoskeletal: Negative.   Neurological: Negative.   Hematological: Negative.   Psychiatric/Behavioral: Negative.    All other systems reviewed and are negative.    Objective: Vital Signs: Ht 5\' 4"  (1.626 m)   Wt 209 lb (94.8 kg)   LMP 05/20/2012   BMI 35.87 kg/m   Physical Exam Vitals and nursing note reviewed.  Constitutional:      Appearance: She is well-developed.  HENT:     Head: Atraumatic.     Nose: Nose normal.  Eyes:     Extraocular Movements: Extraocular movements intact.  Cardiovascular:     Pulses: Normal pulses.  Pulmonary:     Effort: Pulmonary effort is normal.  Abdominal:     Palpations: Abdomen is soft.  Musculoskeletal:     Cervical back: Neck supple.  Skin:    General: Skin is warm.     Capillary Refill: Capillary refill takes less than 2 seconds.  Neurological:     Mental Status: She is alert. Mental status is at baseline.  Psychiatric:        Behavior: Behavior normal.        Thought Content: Thought content normal.  Judgment: Judgment normal.    Specialty Comments:  No specialty comments available.  Imaging: XR Pelvis 1-2 Views Result Date: 03/25/2024 X-rays of the pelvis show bone-on-bone joint space narrowing of the right hip.  Left total hip arthroplasty without any complications.    PMFS History: Patient Active Problem List   Diagnosis Date Noted   Irritable bowel syndrome with both constipation and diarrhea 07/20/2023   Urinary incontinence 01/31/2023   Vaginal itching 01/31/2023   Influenza 11/20/2022   Postmenopausal bleeding 08/18/2022   Acute non-recurrent pansinusitis 06/18/2022   Leg cramps 03/08/2022   Muscle spasm of right lower  extremity 03/08/2022   Primary osteoarthritis of right hip 03/01/2022   ACE inhibitor intolerance 01/19/2022   Weakness 12/22/2021   Left lower quadrant abdominal pain 12/22/2021   Slow transit constipation 12/22/2021   Class 3 severe obesity due to excess calories without serious comorbidity with body mass index (BMI) of 40.0 to 44.9 in adult 07/05/2021   Restless leg syndrome 07/05/2021   Chronic seasonal allergic rhinitis due to pollen 07/05/2021   Psychophysiological insomnia 07/05/2021   Non-compliance 07/05/2021   COVID-19 07/05/2021   Right wrist pain 06/05/2021   Acute pain of left shoulder 01/24/2021   Acute pain of right shoulder 01/24/2021   Foot pain, bilateral 01/24/2021   Dyspepsia 01/24/2021   Preventative health care 01/24/2021   Right hand pain 12/29/2020   OSA (obstructive sleep apnea) 09/12/2020   Nocturia more than twice per night 08/03/2020   Non-restorative sleep 08/03/2020   Leg cramping 08/03/2020   Hyperlipidemia associated with type 2 diabetes mellitus (HCC) 07/25/2020   Large breasts 07/22/2020   RLS (restless legs syndrome) 05/17/2020   Cough 04/01/2020   Asthma, moderate persistent, poorly-controlled 04/01/2020   Medication management 04/01/2020   Menopausal symptoms 03/29/2020   Hot flashes 03/29/2020   Chronic right shoulder pain 08/11/2019   Acute non-recurrent maxillary sinusitis 08/11/2019   BMI 40.0-44.9, adult (HCC) 01/22/2019   Right lower quadrant abdominal pain 10/20/2018   Rectal pain 10/20/2018   Vitamin B 12 deficiency 03/28/2018   B12 deficiency 03/28/2018   DM (diabetes mellitus) type II uncontrolled, periph vascular disorder 03/28/2018   Hyperlipidemia 03/28/2018   Primary osteoarthritis of left hip 02/21/2017   Status post total hip replacement, left 02/21/2017   History of laparoscopic cholecystectomy 06/20/2016   Cerumen impaction 06/09/2016   Hypersomnia 05/16/2016   Knee pain, right 05/05/2016   RUQ pain 04/09/2016    Abnormal CT scan 03/28/2016   Dyspnea and respiratory abnormalities 03/15/2016   Asthma with acute exacerbation 03/15/2016   Asthma in adult 02/25/2016   DM (diabetes mellitus) type II controlled, neurological manifestation (HCC) 02/09/2016   Left hip pain 12/23/2015   Right hamstring muscle strain 11/18/2015   Abdominal pain, acute 09/01/2015   Acute asthma exacerbation 04/16/2015   Acute bronchitis 04/14/2015   Pap smear for cervical cancer screening 09/14/2014   Bed bug bite 05/06/2014   Allergic rhinitis 04/09/2014   Rectal itching 04/09/2014   Bladder spasm 04/09/2014   Knee pain, left 03/05/2014   Hypokalemia 02/19/2014   Nausea with vomiting 02/19/2014   Diabetes mellitus, type II (HCC) 02/19/2014   Edema 02/16/2014   Disorder of rotator cuff 11/12/2013   Routine general medical examination at a health care facility 08/20/2013   Gastroesophageal reflux disease 06/26/2013   Dysphagia, pharyngoesophageal phase 06/26/2013   Suprapubic abdominal pain 05/12/2013   Medication side effect 03/25/2013   Diarrhea 03/25/2013   Benign positional vertigo 01/30/2013  Traumatic hematoma of thigh 01/15/2013   HTN (hypertension) 09/02/2012   Dry skin 08/22/2012   External hemorrhoids 08/22/2012   Obesity 06/30/2012   Thyromegaly 05/22/2012   Back pain 05/22/2012   Elevated glucose 05/22/2012   Moderate persistent asthma 04/19/2012   Dyspnea 01/28/2012   Past Medical History:  Diagnosis Date   Arthritis    Asthma    Complication of anesthesia    one time woke up and was vey scared,17 yrs ago   Deaf    Diabetes mellitus without complication (HCC)    GERD (gastroesophageal reflux disease)    Hypertension    Left groin pain    Nausea with vomiting 02/19/2014   Thyroid  disease     Family History  Problem Relation Age of Onset   Diabetes Mother    Heart disease Mother    Diabetes Father    Colon cancer Neg Hx    Colon polyps Neg Hx    Esophageal cancer Neg Hx    Rectal  cancer Neg Hx    Stomach cancer Neg Hx     Past Surgical History:  Procedure Laterality Date   CARDIAC CATHETERIZATION     CESAREAN SECTION     CHOLECYSTECTOMY N/A 06/20/2016   Procedure: LAPAROSCOPIC CHOLECYSTECTOMY;  Surgeon: Enid Harry, MD;  Location: [facility] OR;  Service: General;  Laterality: N/A;   ESOPHAGEAL MANOMETRY N/A 09/29/2013   Procedure: ESOPHAGEAL MANOMETRY (EM);  Surgeon: Janel Medford, MD;  Location: [facility] ENDOSCOPY;  Service: Endoscopy;  Laterality: N/A;   TOTAL HIP ARTHROPLASTY Left 02/21/2017   Procedure: LEFT TOTAL HIP ARTHROPLASTY ANTERIOR APPROACH;  Surgeon: Wes Hamman, MD;  Location: [facility] OR;  Service: Orthopedics;  Laterality: Left;   Social History   Occupational History   Occupation: disabled  Tobacco Use   Smoking status: Never   Smokeless tobacco: Never  Vaping Use   Vaping status: Never Used  Substance and Sexual Activity   Alcohol use: No    Alcohol/week: 0.0 standard drinks of alcohol   Drug use: No   Sexual activity: Not Currently

## 2024-03-25 NOTE — Telephone Encounter (Signed)
 Clearance form faxed to [facility], PCP.  We cannot proceed with surgery until clearance form has been received.

## 2024-03-26 ENCOUNTER — Encounter (HOSPITAL_COMMUNITY): Payer: Self-pay | Admitting: Emergency Medicine

## 2024-03-26 ENCOUNTER — Emergency Department (HOSPITAL_COMMUNITY)
Admission: EM | Admit: 2024-03-26 | Discharge: 2024-03-26 | Disposition: A | Discharge status: Home / Self Care | Attending: Emergency Medicine | Admitting: Emergency Medicine

## 2024-03-26 ENCOUNTER — Ambulatory Visit: Payer: Self-pay

## 2024-03-26 ENCOUNTER — Emergency Department (HOSPITAL_COMMUNITY)

## 2024-03-26 ENCOUNTER — Other Ambulatory Visit: Payer: Self-pay

## 2024-03-26 DIAGNOSIS — E876 Hypokalemia: Secondary | ICD-10-CM | POA: Diagnosis not present

## 2024-03-26 DIAGNOSIS — R42 Dizziness and giddiness: Secondary | ICD-10-CM | POA: Diagnosis not present

## 2024-03-26 DIAGNOSIS — I499 Cardiac arrhythmia, unspecified: Secondary | ICD-10-CM | POA: Diagnosis not present

## 2024-03-26 DIAGNOSIS — G4489 Other headache syndrome: Secondary | ICD-10-CM | POA: Diagnosis not present

## 2024-03-26 DIAGNOSIS — R001 Bradycardia, unspecified: Secondary | ICD-10-CM | POA: Diagnosis not present

## 2024-03-26 LAB — CBC
HCT: 42.5 % (ref 36.0–46.0)
Hemoglobin: 13.7 g/dL (ref 12.0–15.0)
MCH: 28.1 pg (ref 26.0–34.0)
MCHC: 32.2 g/dL (ref 30.0–36.0)
MCV: 87.1 fL (ref 80.0–100.0)
Platelets: 171 10*3/uL (ref 150–400)
RBC: 4.88 MIL/uL (ref 3.87–5.11)
RDW: 14.1 % (ref 11.5–15.5)
WBC: 6.7 10*3/uL (ref 4.0–10.5)
nRBC: 0 % (ref 0.0–0.2)

## 2024-03-26 LAB — COMPREHENSIVE METABOLIC PANEL WITH GFR
ALT: 20 U/L (ref 0–44)
AST: 21 U/L (ref 15–41)
Albumin: 3.2 g/dL — ABNORMAL LOW (ref 3.5–5.0)
Alkaline Phosphatase: 97 U/L (ref 38–126)
Anion gap: 10 (ref 5–15)
BUN: 14 mg/dL (ref 6–20)
CO2: 27 mmol/L (ref 22–32)
Calcium: 9.4 mg/dL (ref 8.9–10.3)
Chloride: 105 mmol/L (ref 98–111)
Creatinine, Ser: 0.73 mg/dL (ref 0.44–1.00)
GFR, Estimated: >60 mL/min (ref >60)
Glucose, Bld: 115 mg/dL — ABNORMAL HIGH (ref 70–99)
Potassium: 3.1 mmol/L — ABNORMAL LOW (ref 3.5–5.1)
Sodium: 142 mmol/L (ref 135–145)
Total Bilirubin: 0.7 mg/dL (ref 0.0–1.2)
Total Protein: 6.8 g/dL (ref 6.5–8.1)

## 2024-03-26 MED ORDER — SODIUM CHLORIDE 0.9 % IV BOLUS
500.0000 mL | Freq: Once | INTRAVENOUS | Status: AC
  Administered 2024-03-26: 500 mL via INTRAVENOUS

## 2024-03-26 MED ORDER — MECLIZINE HCL 25 MG PO TABS: 25.0000 mg | ORAL_TABLET | Freq: Three times a day (TID) | ORAL | 0 refills | Status: DC | PRN

## 2024-03-26 MED ORDER — POTASSIUM CHLORIDE CRYS ER 20 MEQ PO TBCR: 20.0000 meq | EXTENDED_RELEASE_TABLET | Freq: Two times a day (BID) | ORAL | 0 refills | Status: DC

## 2024-03-26 MED ORDER — MECLIZINE HCL 25 MG PO TABS
25.0000 mg | ORAL_TABLET | Freq: Once | ORAL | Status: AC
  Administered 2024-03-26: 25 mg via ORAL
  Filled 2024-03-26: qty 1

## 2024-03-26 MED ORDER — LORAZEPAM 2 MG/ML IJ SOLN
1.0000 mg | Freq: Once | INTRAMUSCULAR | Status: AC | PRN
  Administered 2024-03-26: 1 mg via INTRAVENOUS
  Filled 2024-03-26: qty 1

## 2024-03-26 NOTE — ED Triage Notes (Signed)
 From home, dizziness that started last night. Tele health visit today told her to come to the ED.

## 2024-03-26 NOTE — Telephone Encounter (Signed)
FYI. Pt going to ED.  

## 2024-03-26 NOTE — Telephone Encounter (Signed)
  Copied From CRM [id number]. Reason for Triage: Patient stated she's been feeling dizzy lately and wanted to know if she should go to the ER. Attempted to transfer patient but she said she would prefer the nurse call.

## 2024-03-26 NOTE — ED Triage Notes (Signed)
 Pt states that she only drinks one small cup of water a day

## 2024-03-26 NOTE — Telephone Encounter (Signed)
 1st attempt call back, no answer.   Copied from CRM [id number]. Topic: Clinical - Red Word Triage >> Mar 26, 2024  3:51 PM Freya Jesus wrote: Red Word that prompted transfer to Nurse Triage: Patient stated she's been feeling dizzy lately and wanted to know if she should go to the ER. Attempted to transfer patient but she said she would prefer the nurse call.

## 2024-03-26 NOTE — ED Provider Notes (Signed)
 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  Rhonda Morrow is a 63 y.o. female.   Dizziness Associated symptoms: palpitations   Palpitations Associated symptoms: dizziness   Patient please with dizziness.  Started yesterday.  States when she stands up she feels as if the room is spinning around.  She is okay if she lays down.  Not worse or turns her head.  Slight dull headache.  States she had an episode like this a few years ago.  Does not eat and drink much.  No ear ringing.  No lateralizing numbness or weakness.  States that is the room spinning around not as if she would pass out.     Home Medications Prior to Admission medications   Medication Sig Start Date End Date Taking? Authorizing Provider  meclizine  (ANTIVERT ) 25 MG tablet Take 1 tablet (25 mg total) by mouth 3 (three) times daily as needed for dizziness. 03/26/24  Yes Mozell Arias, MD  potassium chloride  SA (KLOR-CON  M) 20 MEQ tablet Take 1 tablet (20 mEq total) by mouth 2 (two) times daily. 03/26/24  Yes Mozell Arias, MD  albuterol  (VENTOLIN  HFA) 108 (90 Base) MCG/ACT inhaler Inhale 1-2 puffs into the lungs every 6 (six) hours as needed for wheezing or shortness of breath. J45.40 10/15/23   Parrett, Tammy S, NP  allopurinol  (ZYLOPRIM ) 100 MG tablet TAKE 1 TABLET BY MOUTH EVERY DAY Patient taking differently: as needed. 06/05/22   Lowne Chase, Yvonne R, DO  amLODipine  (NORVASC ) 5 MG tablet TAKE 1 TABLET (5 MG TOTAL) BY MOUTH DAILY. 11/23/23   Estill Hemming, DO  atorvastatin  (LIPITOR) 20 MG tablet Take 1 tablet (20 mg total) by mouth daily. 01/02/24   Crecencio Dodge, Yvonne R, DO  Azelastine  HCl 137 MCG/SPRAY SOLN PLACE 2 SPRAYS INTO BOTH NOSTRILS 2 (TWO) TIMES DAILY. USE IN EACH  NOSTRIL AS DIRECTED Patient taking differently: as needed. 06/05/22   Lowne Chase, Yvonne R, DO  benzonatate  (TESSALON ) 200 MG capsule Take 1 capsule (200 mg total) by mouth 2 (two) times daily as needed for cough. 03/20/23   Everlina Hock, NP  blood glucose meter kit and supplies KIT Dispense based on patient and insurance preference. Use up to four times daily as directed. (FOR ICD-9 250.00, 250.01). 12/05/21   Minna Amass B, NP  cetirizine  (ZYRTEC ) 10 MG tablet TAKE 1 TABLET BY MOUTH EVERY DAY 02/13/24   Crecencio Dodge, Yvonne R, DO  cyclobenzaprine  (FLEXERIL ) 10 MG tablet Take 1 tablet (10 mg total) by mouth 3 (three) times daily as needed for muscle spasms. 03/07/22   Roel Clarity R, DO  diclofenac  (VOLTAREN ) 75 MG EC tablet Take 1 tablet (75 mg total) by mouth 2 (two) times daily. 03/04/24   Roel Clarity R, DO  fluticasone  (FLONASE ) 50 MCG/ACT nasal spray Place 2 sprays into both nostrils daily. 03/20/23   Everlina Hock, NP  fluticasone  (FLONASE ) 50 MCG/ACT nasal spray Place 2 sprays into both nostrils daily. 02/05/24   Lowne Chase, Yvonne R, DO  Fluticasone -Umeclidin-Vilant (TRELEGY ELLIPTA ) 100-62.5-25 MCG/ACT AEPB Inhale 1 puff into the lungs daily. 03/13/24   Parrett, Macdonald Savoy, NP  lansoprazole (PREVACID) 30 MG capsule Take 1 capsule (30 mg total) by mouth daily at  12 noon. 01/17/24   Estill Hemming, DO  LORazepam  (ATIVAN ) 0.5 MG tablet 1-2 tabs 30 - 60 min prior to MRI. Do not drive with this medicine. 01/03/24   Corey, Evan S, MD  metFORMIN  (GLUCOPHAGE -XR) 500 MG 24 hr tablet TAKE 2 TABLETS BY MOUTH EVERY DAY WITH BREAKFAST 02/21/24   Crecencio Dodge, Yvonne R, DO  promethazine -dextromethorphan (PROMETHAZINE -DM) 6.25-15 MG/5ML syrup Take 5 mLs by mouth 4 (four) times daily as needed. 02/05/24   Estill Hemming, DO  Semaglutide  (RYBELSUS ) 7 MG TABS Take 1 tablet (7 mg total) by mouth daily. 07/20/23   Estill Hemming, DO  solifenacin  (VESICARE ) 5 MG tablet Take 1 tablet (5 mg  total) by mouth daily. 09/26/23   Lowne Chase, Yvonne R, DO  terconazole  (TERAZOL 7 ) 0.4 % vaginal cream PLACE 1 APPLICATOR VAGINALLY AT BEDTIME. 12/19/23   Crecencio Dodge, Yvonne R, DO  traMADol  (ULTRAM ) 50 MG tablet Take 1 tablet (50 mg total) by mouth every 8 (eight) hours as needed for severe pain (pain score 7-10). 03/17/24   Corey, Evan S, MD      Allergies    Losartan , Oxycodone -acetaminophen , and Augmentin  [amoxicillin -pot clavulanate]    Review of Systems   Review of Systems  Cardiovascular:  Positive for palpitations.  Neurological:  Positive for dizziness.    Physical Exam Updated Vital Signs BP (!) 139/55   Pulse (!) 28   Temp 97.9 F (36.6 C) (Oral)   Resp 19   Ht 5\' 1"  (1.549 m)   Wt 95 kg   LMP 05/20/2012   SpO2 98%   BMI 39.57 kg/m  Physical Exam Vitals and nursing note reviewed.  Cardiovascular:     Rate and Rhythm: Regular rhythm.  Musculoskeletal:     Cervical back: Neck supple.  Neurological:     Mental Status: She is alert.     Comments: External ocular movements intact.  Face symmetric.  Finger-nose intact bilaterally.  Awake and appropriate.  No facial droop.     ED Results / Procedures / Treatments   Labs (all labs ordered are listed, but only abnormal results are displayed) Labs Reviewed  COMPREHENSIVE METABOLIC PANEL WITH GFR - Abnormal; Notable for the following components:      Result Value   Potassium 3.1 (*)    Glucose, Bld 115 (*)    Albumin 3.2 (*)    All other components within normal limits  CBC    EKG EKG Interpretation Date/Time:  Wednesday Mar 26 2024 18:35:58 EDT Ventricular Rate:  77 PR Interval:  170 QRS Duration:  108 QT Interval:  362 QTC Calculation: 410 R Axis:   -47  Text Interpretation: Sinus rhythm Ventricular trigeminy Left axis deviation Low voltage, precordial leads Consider anterior infarct When compared with ECG of 05/24/2020, No significant change was found Confirmed by Alissa April ([id number]) on 03/26/2024  11:02:54 PM  Radiology MR BRAIN WO CONTRAST Result Date: 03/26/2024 EXAM: MRI BRAIN WITHOUT CONTRAST 03/26/2024 09:24:46 PM TECHNIQUE: Multiplanar multisequence MRI of the head/brain was performed without the administration of intravenous contrast. COMPARISON: CT head without contrast 07/31/2019. CLINICAL HISTORY: Vertigo. FINDINGS: BRAIN AND VENTRICLES: No acute infarct. No intracranial hemorrhage. No mass. No midline shift. No hydrocephalus. The sella is unremarkable. Normal flow voids. Scattered subcortical T2 hyperintensities bilaterally are moderately advanced for age. ORBITS: No acute abnormality. SINUSES AND MASTOIDS: Mastoid effusion is again noted. No obstructing nasopharyngeal lesion is present. BONES AND SOFT TISSUES: Normal marrow signal. No acute soft tissue  abnormality. Mild degenerative changes are present in the upper cervical spine. IMPRESSION: 1. No acute intracranial abnormality related to vertigo. 2. Moderately advanced scattered subcortical T2 hyperintensities bilaterally for age. This likely reflects the sequelae of chronic microvascular ischemia. 3. Mastoid effusion, again noted. No obstructing nasopharyngeal lesion. Electronically signed by: Audree Leas MD 03/26/2024 09:34 PM EDT RP Workstation: [id number]   XR Pelvis 1-2 Views Result Date: 03/25/2024 X-rays of the pelvis show bone-on-bone joint space narrowing of the right hip.  Left total hip arthroplasty without any complications.   Procedures Procedures    Medications Ordered in ED Medications  sodium chloride  0.9 % bolus 500 mL (500 mLs Intravenous New Bag/Given 03/26/24 2010)  meclizine  (ANTIVERT ) tablet 25 mg (25 mg Oral Given 03/26/24 1959)  LORazepam  (ATIVAN ) injection 1 mg (1 mg Intravenous Given 03/26/24 2101)    ED Course/ Medical Decision Making/ A&P                                 Medical Decision Making Amount and/or Complexity of Data Reviewed Labs: ordered. Radiology:  ordered.  Risk Prescription drug management.   Patient with vertigo.  Acute onset yesterday.  Differential diagnosis includes both central and peripheral causes.  Benign exam for other neurologic deficits.  I think patient is high enough risk that an MRI will help rule out central cause.  Will give fluid bolus, get basic blood work and get the MRI.  Will also give dose of Antivert .  Feeling somewhat better.  Hypokalemia having to be treated orally.  MRI negative.  Discussed with patient further.  Follow-up with PCP.  Will treat symptomatically.  Most likely peripheral vertigo.        Final Clinical Impression(s) / ED Diagnoses Final diagnoses:  Vertigo  Hypokalemia    Rx / DC Orders ED Discharge Orders          Ordered    potassium chloride  SA (KLOR-CON  M) 20 MEQ tablet  2 times daily        03/26/24 2240    meclizine  (ANTIVERT ) 25 MG tablet  3 times daily PRN        03/26/24 2240              Mozell Arias, MD 03/26/24 2307

## 2024-03-26 NOTE — ED Notes (Signed)
 U/A obtained

## 2024-03-26 NOTE — Telephone Encounter (Signed)
 Chief Complaint: dizziness Frequency: today Pertinent Negatives: Patient denies vomiting, bleeding, CP, SOB, H/A, one-sided weakness Disposition: [x] ED /[] Urgent Care (no appt availability in office) / [] Appointment(In office/virtual)/ []  [facility]/ [] Home Care/ [] Refused Recommended Disposition /[] [facility]/ []  Follow-up with PCP Additional Notes: sign language interpreter assisted with this call. Pt reports severe dizziness. Pt states she has been unable to get out of bed all day due to dizziness and thinks that if she were to get out of bed and try to walk, she would fall. Pt states standing up exacerbates the dizziness. Pt is unable to stand without holding onto the wall or furniture. Pt denies H/A, one-sided weakness, vomiting, bleeding, CP, or SOB. While pt denies any SOB, she also states she is using an inhaler. RN advised the pt she should go to the ED for severe dizziness. Pt agreeable but states she has no one to take her given that her husband is currently working. Given the severity of pt's dizziness, RN advised pt 911 should be called and pt was agreeable to that plan. RN called 911 and advised the pt that EMS is on the way. Pt verbalized understanding.     Reason for Disposition  SEVERE dizziness (vertigo) (e.g., unable to walk without assistance)  Answer Assessment - Initial Assessment Questions 1. DESCRIPTION: "Describe your dizziness."     "Like everything is spinning" 2. VERTIGO: "Do you feel like either you or the room is spinning or tilting?"      Like the room is spinning 3. LIGHTHEADED: "Do you feel lightheaded?" (e.g., somewhat faint, woozy, weak upon standing)     No  4. SEVERITY: "How bad is it?"  "Can you walk?"   - MILD: Feels slightly dizzy and unsteady, but is walking normally.   - MODERATE: Feels unsteady when walking, but not falling; interferes with normal activities (e.g., school, work).   - SEVERE: Unable to walk without falling, or  requires assistance to walk without falling.     Pt has not fallen, is laying on the bed. Dizziness exacerbated by standing. Pt states she would have to hold onto the wall to stand and walk. Whenever pt stands, she feels dizzy. Pt states she would be unable to walk d/t dizziness. 5. ONSET:  "When did the dizziness begin?"     today 6. AGGRAVATING FACTORS: "Does anything make it worse?" (e.g., standing, change in head position)     Standing  7. CAUSE: "What do you think is causing the dizziness?"     Not sure  8. RECURRENT SYMPTOM: "Have you had dizziness before?" If Yes, ask: "When was the last time?" "What happened that time?"     No  9. OTHER SYMPTOMS: "Do you have any other symptoms?" (e.g., headache, weakness, numbness, vomiting, earache)     Tried ice on her shoulders, hip pain & having surgery soon   Denies CP. "I have an inhaler, I recently did my inhaler." "I am breathing fine." Denies one-sided weakness or numbness. Denies H/A. Denies nausea or vomiting  Protocols used: Dizziness - Vertigo-A-AH

## 2024-03-26 NOTE — Telephone Encounter (Signed)
 Attempt made to call patient back: no answer: voicemail/interpreter answered call and nurse left message for pt to return call.  Will route this chart to PCP office.

## 2024-03-27 NOTE — Telephone Encounter (Signed)
 Pt seen at ED

## 2024-03-28 ENCOUNTER — Other Ambulatory Visit: Payer: Self-pay | Admitting: Family Medicine

## 2024-03-28 DIAGNOSIS — M160 Bilateral primary osteoarthritis of hip: Secondary | ICD-10-CM

## 2024-03-28 DIAGNOSIS — J069 Acute upper respiratory infection, unspecified: Secondary | ICD-10-CM

## 2024-03-30 ENCOUNTER — Other Ambulatory Visit: Payer: Self-pay | Admitting: Family Medicine

## 2024-03-30 DIAGNOSIS — R32 Unspecified urinary incontinence: Secondary | ICD-10-CM

## 2024-04-01 ENCOUNTER — Encounter: Payer: Self-pay | Admitting: Family Medicine

## 2024-04-01 ENCOUNTER — Ambulatory Visit (INDEPENDENT_AMBULATORY_CARE_PROVIDER_SITE_OTHER): Admitting: Family Medicine

## 2024-04-01 VITALS — BP 124/88 | HR 81 | Temp 97.7°F | Resp 18 | Ht 61.0 in | Wt 210.0 lb

## 2024-04-01 DIAGNOSIS — E876 Hypokalemia: Secondary | ICD-10-CM

## 2024-04-01 DIAGNOSIS — M25551 Pain in right hip: Secondary | ICD-10-CM

## 2024-04-01 DIAGNOSIS — E785 Hyperlipidemia, unspecified: Secondary | ICD-10-CM | POA: Diagnosis not present

## 2024-04-01 DIAGNOSIS — M1611 Unilateral primary osteoarthritis, right hip: Secondary | ICD-10-CM | POA: Diagnosis not present

## 2024-04-01 DIAGNOSIS — E1169 Type 2 diabetes mellitus with other specified complication: Secondary | ICD-10-CM | POA: Diagnosis not present

## 2024-04-01 DIAGNOSIS — H7490 Unspecified disorder of middle ear and mastoid, unspecified ear: Secondary | ICD-10-CM | POA: Insufficient documentation

## 2024-04-01 DIAGNOSIS — Z01818 Encounter for other preprocedural examination: Secondary | ICD-10-CM

## 2024-04-01 DIAGNOSIS — I10 Essential (primary) hypertension: Secondary | ICD-10-CM

## 2024-04-01 DIAGNOSIS — I498 Other specified cardiac arrhythmias: Secondary | ICD-10-CM

## 2024-04-01 DIAGNOSIS — J4540 Moderate persistent asthma, uncomplicated: Secondary | ICD-10-CM

## 2024-04-01 DIAGNOSIS — E1165 Type 2 diabetes mellitus with hyperglycemia: Secondary | ICD-10-CM

## 2024-04-01 MED ORDER — POTASSIUM CHLORIDE ER 10 MEQ PO TBCR: 10.0000 meq | EXTENDED_RELEASE_TABLET | Freq: Two times a day (BID) | ORAL | 2 refills | Status: DC

## 2024-04-01 MED ORDER — CEFDINIR 300 MG PO CAPS: 300.0000 mg | ORAL_CAPSULE | Freq: Two times a day (BID) | ORAL | 0 refills | Status: DC

## 2024-04-01 NOTE — Telephone Encounter (Signed)
 Last OV 03/17/24 Next OV not scheduled  Last refill 03/17/24 Qty # 15/0

## 2024-04-01 NOTE — Patient Instructions (Signed)
 Hip Pain The hip is the joint between the upper legs and the lower pelvis. The bones, cartilage, tendons, and muscles of your hip joint support your body and allow you to move around. Hip pain can range from a minor ache to severe pain in one or both of your hips. The pain may be felt on the inside of the hip joint near the groin, or on the outside near the buttocks and upper thigh. You may also have swelling or stiffness in your hip area. Follow these instructions at home: Managing pain, stiffness, and swelling     If told, put ice on the painful area. Put ice in a plastic bag. Place a towel between your skin and the bag. Leave the ice on for 20 minutes, 2-3 times a day. If told, apply heat to the affected area as often as told by your health care provider. Use the heat source that your provider recommends, such as a moist heat pack or a heating pad. Place a towel between your skin and the heat source. Leave the heat on for 20-30 minutes. If your skin turns bright red, remove the ice or heat right away to prevent skin damage. The risk of damage is higher if you cannot feel pain, heat, or cold. Activity Do exercises as told by your provider. Avoid activities that cause pain. General instructions  Take over-the-counter and prescription medicines only as told by your provider. Keep a journal of your symptoms. Write down: How often you have hip pain. The location of your pain. What the pain feels like. What makes the pain worse. Sleep with a pillow between your legs on your most comfortable side. Keep all follow-up visits. Your provider will monitor your pain and activity. Contact a health care provider if: You cannot put weight on your leg. Your pain or swelling gets worse after a week. It gets harder to walk. You have a fever. Get help right away if: You fall. You have a sudden increase in pain and swelling in your hip. Your hip is red or swollen or very tender to touch. This  information is not intended to replace advice given to you by your health care provider. Make sure you discuss any questions you have with your health care provider. Document Revised: 06/27/2022 Document Reviewed: 06/27/2022 Elsevier Patient Education  2024 [employer].

## 2024-04-01 NOTE — Assessment & Plan Note (Signed)
Pending hip replacement  

## 2024-04-01 NOTE — Progress Notes (Signed)
 Established Patient Office Visit  Subjective   Patient ID: EMALI HEYWARD, female    DOB: 24-Jan-1961  Age: 63 y.o. MRN: [id number]  Chief Complaint  Patient presents with   ED follow up    Vertigo, per pt vertigo is slowly getting better    HPI Discussed the use of AI scribe software for clinical note transcription with the patient, who gave verbal consent to proceed.  History of Present Illness ONESHA KREBBS is a 63 year old female who presents with dizziness and hip pain.  She experiences dizziness that has improved but persists with sudden movements or when looking down. She manages the symptoms by lying down and keeping her head straight. She visited the ER, where she was diagnosed with vertigo, possibly related to fluid behind her ear. An MRI was performed, and she was prescribed Antivert . No vomiting or diarrhea.  She has a history of hip pain described as 'bone on bone' with no support, causing significant discomfort and impacting her mobility. She often rests with her leg elevated. She is retired, having previously worked on a [employer], which may have contributed to her hip issues.  Her medical history includes high blood pressure and diabetes, for which she takes amlodipine  and atorvastatin . She was also given potassium supplements due to low levels identified in the ER. She is cautious about taking new medications due to past confusion and lack of an interpreter during her ER visit.  She recently received a new inhaler, Trelegy, prescribed by Frances Ingles.   Patient Active Problem List   Diagnosis Date Noted   Mastoid disorder, unspecified laterality 04/01/2024   Irritable bowel syndrome with both constipation and diarrhea 07/20/2023   Urinary incontinence 01/31/2023   Vaginal itching 01/31/2023   Influenza 11/20/2022   Postmenopausal bleeding 08/18/2022   Acute non-recurrent pansinusitis 06/18/2022   Leg cramps 03/08/2022   Muscle spasm of right lower extremity  03/08/2022   Primary osteoarthritis of right hip 03/01/2022   ACE inhibitor intolerance 01/19/2022   Weakness 12/22/2021   Left lower quadrant abdominal pain 12/22/2021   Slow transit constipation 12/22/2021   Class 3 severe obesity due to excess calories without serious comorbidity with body mass index (BMI) of 40.0 to 44.9 in adult 07/05/2021   Restless leg syndrome 07/05/2021   Chronic seasonal allergic rhinitis due to pollen 07/05/2021   Psychophysiological insomnia 07/05/2021   Non-compliance 07/05/2021   COVID-19 07/05/2021   Right wrist pain 06/05/2021   Acute pain of left shoulder 01/24/2021   Acute pain of right shoulder 01/24/2021   Foot pain, bilateral 01/24/2021   Dyspepsia 01/24/2021   Preventative health care 01/24/2021   Right hand pain 12/29/2020   OSA (obstructive sleep apnea) 09/12/2020   Nocturia more than twice per night 08/03/2020   Non-restorative sleep 08/03/2020   Leg cramping 08/03/2020   Hyperlipidemia associated with type 2 diabetes mellitus (HCC) 07/25/2020   Large breasts 07/22/2020   RLS (restless legs syndrome) 05/17/2020   Cough 04/01/2020   Asthma, moderate persistent, poorly-controlled 04/01/2020   Medication management 04/01/2020   Menopausal symptoms 03/29/2020   Hot flashes 03/29/2020   Chronic right shoulder pain 08/11/2019   Acute non-recurrent maxillary sinusitis 08/11/2019   BMI 40.0-44.9, adult (HCC) 01/22/2019   Right lower quadrant abdominal pain 10/20/2018   Rectal pain 10/20/2018   Vitamin B 12 deficiency 03/28/2018   B12 deficiency 03/28/2018   DM (diabetes mellitus) type II uncontrolled, periph vascular disorder 03/28/2018   Hyperlipidemia 03/28/2018  Primary osteoarthritis of left hip 02/21/2017   Status post total hip replacement, left 02/21/2017   History of laparoscopic cholecystectomy 06/20/2016   Cerumen impaction 06/09/2016   Hypersomnia 05/16/2016   Knee pain, right 05/05/2016   RUQ pain 04/09/2016   Abnormal CT  scan 03/28/2016   Dyspnea and respiratory abnormalities 03/15/2016   Asthma with acute exacerbation 03/15/2016   Asthma in adult 02/25/2016   DM (diabetes mellitus) type II controlled, neurological manifestation (HCC) 02/09/2016   Right hip pain 12/23/2015   Right hamstring muscle strain 11/18/2015   Abdominal pain, acute 09/01/2015   Acute asthma exacerbation 04/16/2015   Acute bronchitis 04/14/2015   Pap smear for cervical cancer screening 09/14/2014   Bed bug bite 05/06/2014   Allergic rhinitis 04/09/2014   Rectal itching 04/09/2014   Bladder spasm 04/09/2014   Knee pain, left 03/05/2014   Hypokalemia 02/19/2014   Nausea with vomiting 02/19/2014   Diabetes mellitus, type II (HCC) 02/19/2014   Edema 02/16/2014   Disorder of rotator cuff 11/12/2013   Routine general medical examination at a health care facility 08/20/2013   Gastroesophageal reflux disease 06/26/2013   Dysphagia, pharyngoesophageal phase 06/26/2013   Suprapubic abdominal pain 05/12/2013   Medication side effect 03/25/2013   Diarrhea 03/25/2013   Benign positional vertigo 01/30/2013   Traumatic hematoma of thigh 01/15/2013   HTN (hypertension) 09/02/2012   Dry skin 08/22/2012   External hemorrhoids 08/22/2012   Obesity 06/30/2012   Thyromegaly 05/22/2012   Back pain 05/22/2012   Elevated glucose 05/22/2012   Moderate persistent asthma 04/19/2012   Dyspnea 01/28/2012   Past Medical History:  Diagnosis Date   Arthritis    Asthma    Complication of anesthesia    one time woke up and was vey scared,17 yrs ago   Deaf    Diabetes mellitus without complication (HCC)    GERD (gastroesophageal reflux disease)    Hypertension    Left groin pain    Nausea with vomiting 02/19/2014   Thyroid  disease    Past Surgical History:  Procedure Laterality Date   CARDIAC CATHETERIZATION     CESAREAN SECTION     CHOLECYSTECTOMY N/A 06/20/2016   Procedure: LAPAROSCOPIC CHOLECYSTECTOMY;  Surgeon: Enid Harry, MD;   Location: [facility] OR;  Service: General;  Laterality: N/A;   ESOPHAGEAL MANOMETRY N/A 09/29/2013   Procedure: ESOPHAGEAL MANOMETRY (EM);  Surgeon: Janel Medford, MD;  Location: [facility] ENDOSCOPY;  Service: Endoscopy;  Laterality: N/A;   TOTAL HIP ARTHROPLASTY Left 02/21/2017   Procedure: LEFT TOTAL HIP ARTHROPLASTY ANTERIOR APPROACH;  Surgeon: Wes Hamman, MD;  Location: [facility] OR;  Service: Orthopedics;  Laterality: Left;   Social History   Tobacco Use   Smoking status: Never   Smokeless tobacco: Never  Vaping Use   Vaping status: Never Used  Substance Use Topics   Alcohol use: No    Alcohol/week: 0.0 standard drinks of alcohol   Drug use: No   Social History   Socioeconomic History   Marital status: Married    Spouse name: Not on file   Number of children: Not on file   Years of education: Not on file   Highest education level: Not on file  Occupational History   Occupation: disabled  Tobacco Use   Smoking status: Never   Smokeless tobacco: Never  Vaping Use   Vaping status: Never Used  Substance and Sexual Activity   Alcohol use: No    Alcohol/week: 0.0 standard drinks of alcohol   Drug use:  No   Sexual activity: Not Currently  Other Topics Concern   Not on file  Social History Narrative   Lives with family.  Has 3 children.  Works for [employer]  Education: high school.   Social Drivers of Corporate investment banker Strain: Not on file  Food Insecurity: Not on file  Transportation Needs: Not on file  Physical Activity: Not on file  Stress: Not on file  Social Connections: Unknown (03/18/2022)   Received from [facility]   Social Network    Social Network: Not on file  Intimate Partner Violence: Unknown (02/08/2022)   Received from [facility], [facility]   HITS    Physically Hurt: Not on file    Insult or Talk Down To: Not on file    Threaten Physical Harm: Not on file    Scream or Curse: Not on file   Family Status  Relation Name Status    Mother  (Not Specified)   Father  (Not Specified)   Neg Hx  (Not Specified)  No partnership data on file   Family History  Problem Relation Age of Onset   Diabetes Mother    Heart disease Mother    Diabetes Father    Colon cancer Neg Hx    Colon polyps Neg Hx    Esophageal cancer Neg Hx    Rectal cancer Neg Hx    Stomach cancer Neg Hx    Allergies  Allergen Reactions   Losartan  Shortness Of Breath   Oxycodone -Acetaminophen  Nausea And Vomiting   Augmentin  [Amoxicillin -Pot Clavulanate] Diarrhea      Review of Systems  Constitutional:  Negative for fever and malaise/fatigue.  HENT:  Negative for congestion.   Eyes:  Negative for blurred vision.  Respiratory:  Negative for cough and shortness of breath.   Cardiovascular:  Negative for chest pain, palpitations and leg swelling.  Gastrointestinal:  Negative for vomiting.  Musculoskeletal:  Positive for back pain and joint pain.  Skin:  Negative for rash.  Neurological:  Negative for loss of consciousness and headaches.      Objective:     BP 124/88 (BP Location: Left Arm, Patient Position: Sitting, Cuff Size: Normal)   Pulse 81   Temp 97.7 F (36.5 C) (Oral)   Resp 18   Ht 5\' 1"  (1.549 m)   Wt 210 lb (95.3 kg)   LMP 05/20/2012   SpO2 99%   BMI 39.68 kg/m  BP Readings from Last 3 Encounters:  04/01/24 124/88  03/26/24 (!) 145/65  03/17/24 112/82   Wt Readings from Last 3 Encounters:  04/01/24 210 lb (95.3 kg)  03/26/24 209 lb 7 oz (95 kg)  03/25/24 209 lb (94.8 kg)   SpO2 Readings from Last 3 Encounters:  04/01/24 99%  03/26/24 97%  03/17/24 99%      Physical Exam Vitals and nursing note reviewed.  Constitutional:      General: She is not in acute distress.    Appearance: Normal appearance. She is well-developed.  HENT:     Head: Normocephalic and atraumatic.  Eyes:     General: No scleral icterus.       Right eye: No discharge.        Left eye: No discharge.  Cardiovascular:     Rate and  Rhythm: Normal rate and regular rhythm.     Heart sounds: No murmur heard. Pulmonary:     Effort: Pulmonary effort is normal. No respiratory distress.  Breath sounds: Normal breath sounds.  Musculoskeletal:        General: Tenderness present. Normal range of motion.     Cervical back: Normal range of motion and neck supple.     Right lower leg: No edema.     Left lower leg: No edema.  Skin:    General: Skin is warm and dry.  Neurological:     Mental Status: She is alert and oriented to person, place, and time.  Psychiatric:        Mood and Affect: Mood normal.        Behavior: Behavior normal.        Thought Content: Thought content normal.        Judgment: Judgment normal.      No results found for any visits on 04/01/24.  Last CBC Lab Results  Component Value Date   WBC 6.7 03/26/2024   HGB 13.7 03/26/2024   HCT 42.5 03/26/2024   MCV 87.1 03/26/2024   MCH 28.1 03/26/2024   RDW 14.1 03/26/2024   PLT 171 03/26/2024   Last metabolic panel Lab Results  Component Value Date   GLUCOSE 115 (H) 03/26/2024   NA 142 03/26/2024   K 3.1 (L) 03/26/2024   CL 105 03/26/2024   CO2 27 03/26/2024   BUN 14 03/26/2024   CREATININE 0.73 03/26/2024   GFRNONAA >60 03/26/2024   CALCIUM  9.4 03/26/2024   PHOS 3.0 03/07/2022   PROT 6.8 03/26/2024   ALBUMIN 3.2 (L) 03/26/2024   BILITOT 0.7 03/26/2024   ALKPHOS 97 03/26/2024   AST 21 03/26/2024   ALT 20 03/26/2024   ANIONGAP 10 03/26/2024   Last lipids Lab Results  Component Value Date   CHOL 113 07/20/2023   HDL 60 07/20/2023   LDLCALC 38 07/20/2023   TRIG 66 07/20/2023   CHOLHDL 1.9 07/20/2023   Last hemoglobin A1c Lab Results  Component Value Date   HGBA1C 6.7 (H) 07/20/2023   Last thyroid  functions Lab Results  Component Value Date   TSH 1.23 08/18/2022   T4TOTAL 10.7 12/22/2021   Last vitamin D  Lab Results  Component Value Date   VD25OH 52.50 02/08/2016   Last vitamin B12 and Folate Lab Results   Component Value Date   VITAMINB12 868 12/22/2021   FOLATE 19.2 05/14/2019    EKG--- bigeminy --new   The ASCVD Risk score (Arnett DK, et al., 2019) failed to calculate for the following reasons:   The valid total cholesterol range is 130 to 320 mg/dL    Assessment & Plan:   Problem List Items Addressed This Visit       Unprioritized   Hypokalemia   Relevant Medications   potassium chloride  (KLOR-CON ) 10 MEQ tablet   Diabetes mellitus, type II (HCC)   Relevant Orders   Lipid panel   PTT   Protime-INR   Primary osteoarthritis of right hip - Primary   Relevant Orders   Lipid panel   PTT   Protime-INR   Hyperlipidemia associated with type 2 diabetes mellitus (HCC)   Relevant Orders   Lipid panel   CBC with Differential/Platelet   Comprehensive metabolic panel with GFR   Hemoglobin A1c   PTT   Protime-INR   HTN (hypertension)   Relevant Orders   Lipid panel   Comprehensive metabolic panel with GFR   Hemoglobin A1c   Microalbumin / creatinine urine ratio   PTT   Protime-INR   Moderate persistent asthma   Relevant Orders   Lipid  panel   PTT   Protime-INR   Right hip pain   Pending hip replacement       Mastoid disorder, unspecified laterality   Relevant Medications   cefdinir  (OMNICEF ) 300 MG capsule   Other Visit Diagnoses       Preop examination       Relevant Orders   Lipid panel   CBC with Differential/Platelet   Comprehensive metabolic panel with GFR   Hemoglobin A1c   Microalbumin / creatinine urine ratio   PTT   Protime-INR   EKG 12-Lead (Completed)       Return if symptoms worsen or fail to improve.    Jullian Clayson R Lowne Chase, DO

## 2024-04-02 ENCOUNTER — Other Ambulatory Visit: Payer: Self-pay | Admitting: Family Medicine

## 2024-04-02 DIAGNOSIS — M160 Bilateral primary osteoarthritis of hip: Secondary | ICD-10-CM

## 2024-04-02 DIAGNOSIS — N898 Other specified noninflammatory disorders of vagina: Secondary | ICD-10-CM

## 2024-04-02 LAB — CBC WITH DIFFERENTIAL/PLATELET
Basophils Absolute: 0.1 10*3/uL (ref 0.0–0.1)
Basophils Relative: 1 % (ref 0.0–3.0)
Eosinophils Absolute: 0.1 10*3/uL (ref 0.0–0.7)
Eosinophils Relative: 1.9 % (ref 0.0–5.0)
HCT: 44 % (ref 36.0–46.0)
Hemoglobin: 14.4 g/dL (ref 12.0–15.0)
Lymphocytes Relative: 26.6 % (ref 12.0–46.0)
Lymphs Abs: 2 10*3/uL (ref 0.7–4.0)
MCHC: 32.7 g/dL (ref 30.0–36.0)
MCV: 84.8 fl (ref 78.0–100.0)
Monocytes Absolute: 0.8 10*3/uL (ref 0.1–1.0)
Monocytes Relative: 10.8 % (ref 3.0–12.0)
Neutro Abs: 4.4 10*3/uL (ref 1.4–7.7)
Neutrophils Relative %: 59.7 % (ref 43.0–77.0)
Platelets: 178 10*3/uL (ref 150.0–400.0)
RBC: 5.19 Mil/uL — ABNORMAL HIGH (ref 3.87–5.11)
RDW: 15.1 % (ref 11.5–15.5)
WBC: 7.3 10*3/uL (ref 4.0–10.5)

## 2024-04-02 LAB — LIPID PANEL
Cholesterol: 149 mg/dL (ref 0–200)
HDL: 57 mg/dL (ref >39.00)
LDL Cholesterol: 74 mg/dL (ref 0–99)
NonHDL: 91.63
Total CHOL/HDL Ratio: 3
Triglycerides: 88 mg/dL (ref 0.0–149.0)
VLDL: 17.6 mg/dL (ref 0.0–40.0)

## 2024-04-02 LAB — COMPREHENSIVE METABOLIC PANEL WITH GFR
ALT: 13 U/L (ref 0–35)
AST: 15 U/L (ref 0–37)
Albumin: 4 g/dL (ref 3.5–5.2)
Alkaline Phosphatase: 112 U/L (ref 39–117)
BUN: 16 mg/dL (ref 6–23)
CO2: 33 meq/L — ABNORMAL HIGH (ref 19–32)
Calcium: 10 mg/dL (ref 8.4–10.5)
Chloride: 99 meq/L (ref 96–112)
Creatinine, Ser: 0.84 mg/dL (ref 0.40–1.20)
GFR: 75.46 mL/min (ref >60.00)
Glucose, Bld: 97 mg/dL (ref 70–99)
Potassium: 3.4 meq/L — ABNORMAL LOW (ref 3.5–5.1)
Sodium: 140 meq/L (ref 135–145)
Total Bilirubin: 0.6 mg/dL (ref 0.2–1.2)
Total Protein: 6.7 g/dL (ref 6.0–8.3)

## 2024-04-02 LAB — APTT: aPTT: 30.4 s (ref 25.4–36.8)

## 2024-04-02 LAB — PROTIME-INR
INR: 1.1 ratio — ABNORMAL HIGH (ref 0.8–1.0)
Prothrombin Time: 11.7 s (ref 9.6–13.1)

## 2024-04-02 LAB — HEMOGLOBIN A1C: Hgb A1c MFr Bld: 6.4 % (ref 4.6–6.5)

## 2024-04-07 ENCOUNTER — Ambulatory Visit: Payer: Self-pay | Admitting: Family Medicine

## 2024-04-07 DIAGNOSIS — M10079 Idiopathic gout, unspecified ankle and foot: Secondary | ICD-10-CM

## 2024-04-07 DIAGNOSIS — E119 Type 2 diabetes mellitus without complications: Secondary | ICD-10-CM

## 2024-04-07 MED ORDER — ALBUTEROL SULFATE HFA 108 (90 BASE) MCG/ACT IN AERS: 1.0000 | INHALATION_SPRAY | Freq: Four times a day (QID) | RESPIRATORY_TRACT | 3 refills | Status: DC | PRN

## 2024-04-07 NOTE — Addendum Note (Signed)
 Addended by: Drema Genta on: 04/07/2024 04:01 PM   Modules accepted: Orders

## 2024-04-07 NOTE — Telephone Encounter (Signed)
 Request refill of albuterol  inhaler

## 2024-04-13 ENCOUNTER — Other Ambulatory Visit: Payer: Self-pay | Admitting: Family Medicine

## 2024-04-13 DIAGNOSIS — N898 Other specified noninflammatory disorders of vagina: Secondary | ICD-10-CM

## 2024-04-13 DIAGNOSIS — I10 Essential (primary) hypertension: Secondary | ICD-10-CM

## 2024-04-14 ENCOUNTER — Ambulatory Visit: Payer: Federal, State, Local not specified - PPO | Admitting: Adult Health

## 2024-04-14 NOTE — Telephone Encounter (Signed)
 Last OV 03/17/24 Next OV not scheduled  Last refill 04/02/24 Qty #15/0

## 2024-04-16 ENCOUNTER — Other Ambulatory Visit: Payer: Self-pay | Admitting: Family Medicine

## 2024-04-16 DIAGNOSIS — M10079 Idiopathic gout, unspecified ankle and foot: Secondary | ICD-10-CM

## 2024-04-16 NOTE — Telephone Encounter (Signed)
 Copied from CRM [id number]. Topic: Clinical - Medication Refill >> Apr 16, 2024  2:43 PM Leah C wrote: Medication: allopurinol  (ZYLOPRIM ) 100 MG tablet  Has the patient contacted their pharmacy? Yes (Agent: If no, request that the patient contact the pharmacy for the refill. If patient does not wish to contact the pharmacy document the reason why and proceed with request.) (Agent: If yes, when and what did the pharmacy advise?)  This is the patient's preferred pharmacy:  [id number] [address] [address], [place] - [address] [place] [address] Phone: [phone] Fax: [phone]   Is this the correct pharmacy for this prescription? Yes If no, delete pharmacy and type the correct one.   Has the prescription been filled recently? Yes  Is the patient out of the medication? Yes, completely out.  Has the patient been seen for an appointment in the last year OR does the patient have an upcoming appointment? Yes  Can we respond through MyChart? Yes  Agent: Please be advised that Rx refills may take up to 3 business days. We ask that you follow-up with your pharmacy.

## 2024-04-16 NOTE — Telephone Encounter (Signed)
 Last refilled in 2023- message sent to PCP to see if okay to refill, see mychart message. Awaiting response.

## 2024-04-17 MED ORDER — ALLOPURINOL 100 MG PO TABS: 100.0000 mg | ORAL_TABLET | Freq: Every day | ORAL | 0 refills | Status: DC

## 2024-04-17 NOTE — Addendum Note (Signed)
 Addended by: Armya Westerhoff D on: 04/17/2024 07:42 AM   Modules accepted: Orders

## 2024-04-23 NOTE — Addendum Note (Signed)
 Addended by: Kinslie Hove D on: 04/23/2024 03:58 PM   Modules accepted: Orders

## 2024-04-28 ENCOUNTER — Ambulatory Visit: Admitting: Family Medicine

## 2024-05-02 ENCOUNTER — Encounter: Payer: Self-pay | Admitting: Family Medicine

## 2024-05-02 ENCOUNTER — Ambulatory Visit (INDEPENDENT_AMBULATORY_CARE_PROVIDER_SITE_OTHER): Admitting: Family Medicine

## 2024-05-02 VITALS — BP 110/80 | HR 83 | Temp 97.6°F | Resp 18 | Ht 61.0 in | Wt 210.0 lb

## 2024-05-02 DIAGNOSIS — E1149 Type 2 diabetes mellitus with other diabetic neurological complication: Secondary | ICD-10-CM

## 2024-05-02 DIAGNOSIS — E1169 Type 2 diabetes mellitus with other specified complication: Secondary | ICD-10-CM

## 2024-05-02 DIAGNOSIS — M160 Bilateral primary osteoarthritis of hip: Secondary | ICD-10-CM | POA: Diagnosis not present

## 2024-05-02 DIAGNOSIS — E1165 Type 2 diabetes mellitus with hyperglycemia: Secondary | ICD-10-CM

## 2024-05-02 DIAGNOSIS — I10 Essential (primary) hypertension: Secondary | ICD-10-CM | POA: Diagnosis not present

## 2024-05-02 DIAGNOSIS — E785 Hyperlipidemia, unspecified: Secondary | ICD-10-CM

## 2024-05-02 MED ORDER — DICLOFENAC SODIUM 75 MG PO TBEC: 75.0000 mg | DELAYED_RELEASE_TABLET | Freq: Two times a day (BID) | ORAL | 0 refills | Status: DC

## 2024-05-02 MED ORDER — TRAMADOL HCL 50 MG PO TABS: 50.0000 mg | ORAL_TABLET | Freq: Two times a day (BID) | ORAL | 0 refills | Status: DC | PRN

## 2024-05-02 MED ORDER — TRAMADOL HCL 50 MG PO TABS: 50.0000 mg | ORAL_TABLET | Freq: Three times a day (TID) | ORAL | 0 refills | Status: DC | PRN

## 2024-05-02 NOTE — Assessment & Plan Note (Signed)
 Lab Results  Component Value Date   HGBA1C 6.4 04/01/2024   HGBA1C 6.7 (H) 07/20/2023   HGBA1C 6.9 (H) 12/14/2022   Lab Results  Component Value Date   MICROALBUR 3.3 (H) 01/17/2022   LDLCALC 74 04/01/2024   CREATININE 0.84 04/01/2024

## 2024-05-02 NOTE — Assessment & Plan Note (Signed)
 Encourage heart healthy diet such as MIND or DASH diet, increase exercise, avoid trans fats, simple carbohydrates and processed foods, consider a krill or fish or flaxseed oil cap daily.

## 2024-05-02 NOTE — Assessment & Plan Note (Signed)
 Well controlled, no changes to meds. Encouraged heart healthy diet such as the DASH diet and exercise as tolerated.

## 2024-05-02 NOTE — Progress Notes (Signed)
 Established Patient Office Visit  Subjective   Patient ID: Rhonda Morrow, female    DOB: 1961/10/19  Age: 63 y.o. MRN: [id number]  Chief Complaint  Patient presents with   Hypertension   Hyperlipidemia   Diabetes   Follow-up    HPI Discussed the use of AI scribe software for clinical note transcription with the patient, who gave verbal consent to proceed.  History of Present Illness Rhonda Morrow is a 63 year old female who presents for a medication follow-up and to coordinate upcoming medical appointments.  She is preparing for hip surgery and requires preoperative clearance from cardiology and pulmonology. She has appointments scheduled with both specialties on July 29th, but the timing may necessitate rescheduling. She experiences hip pain, which causes her to limp.  Two weeks ago, she experienced ankle swelling, which has since resolved. Elevating her legs helps reduce the swelling. No current ankle swelling is noted.  She reports soreness on the left side, particularly when sleeping, and plans to schedule a mammogram soon.  Her current medications include tramadol  and clopidogrel. She mentioned receiving Grabelsus only once and is unclear about her recent blood test results, which were communicated through her chart. She successfully had blood drawn in May but was unable to provide a urine specimen at that time.  She is managing stress related to her hip condition and other personal matters, which she feels is impacting her health.   Patient Active Problem List   Diagnosis Date Noted   Mastoid disorder, unspecified laterality 04/01/2024   Irritable bowel syndrome with both constipation and diarrhea 07/20/2023   Urinary incontinence 01/31/2023   Vaginal itching 01/31/2023   Influenza 11/20/2022   Postmenopausal bleeding 08/18/2022   Acute non-recurrent pansinusitis 06/18/2022   Leg cramps 03/08/2022   Muscle spasm of right lower extremity 03/08/2022   Primary  osteoarthritis of right hip 03/01/2022   ACE inhibitor intolerance 01/19/2022   Weakness 12/22/2021   Left lower quadrant abdominal pain 12/22/2021   Slow transit constipation 12/22/2021   Class 3 severe obesity due to excess calories without serious comorbidity with body mass index (BMI) of 40.0 to 44.9 in adult 07/05/2021   Restless leg syndrome 07/05/2021   Chronic seasonal allergic rhinitis due to pollen 07/05/2021   Psychophysiological insomnia 07/05/2021   Non-compliance 07/05/2021   COVID-19 07/05/2021   Right wrist pain 06/05/2021   Acute pain of left shoulder 01/24/2021   Acute pain of right shoulder 01/24/2021   Foot pain, bilateral 01/24/2021   Dyspepsia 01/24/2021   Preventative health care 01/24/2021   Right hand pain 12/29/2020   OSA (obstructive sleep apnea) 09/12/2020   Nocturia more than twice per night 08/03/2020   Non-restorative sleep 08/03/2020   Leg cramping 08/03/2020   Hyperlipidemia associated with type 2 diabetes mellitus (HCC) 07/25/2020   Large breasts 07/22/2020   RLS (restless legs syndrome) 05/17/2020   Cough 04/01/2020   Asthma, moderate persistent, poorly-controlled 04/01/2020   Medication management 04/01/2020   Menopausal symptoms 03/29/2020   Hot flashes 03/29/2020   Chronic right shoulder pain 08/11/2019   Acute non-recurrent maxillary sinusitis 08/11/2019   BMI 40.0-44.9, adult (HCC) 01/22/2019   Right lower quadrant abdominal pain 10/20/2018   Rectal pain 10/20/2018   Vitamin B 12 deficiency 03/28/2018   B12 deficiency 03/28/2018   DM (diabetes mellitus) type II uncontrolled, periph vascular disorder 03/28/2018   Hyperlipidemia 03/28/2018   Primary osteoarthritis of left hip 02/21/2017   Status post total hip replacement, left 02/21/2017  History of laparoscopic cholecystectomy 06/20/2016   Cerumen impaction 06/09/2016   Hypersomnia 05/16/2016   Knee pain, right 05/05/2016   RUQ pain 04/09/2016   Abnormal CT scan 03/28/2016    Dyspnea and respiratory abnormalities 03/15/2016   Asthma with acute exacerbation 03/15/2016   Asthma in adult 02/25/2016   DM (diabetes mellitus) type II controlled, neurological manifestation (HCC) 02/09/2016   Right hip pain 12/23/2015   Right hamstring muscle strain 11/18/2015   Abdominal pain, acute 09/01/2015   Acute asthma exacerbation 04/16/2015   Acute bronchitis 04/14/2015   Pap smear for cervical cancer screening 09/14/2014   Bed bug bite 05/06/2014   Allergic rhinitis 04/09/2014   Rectal itching 04/09/2014   Bladder spasm 04/09/2014   Knee pain, left 03/05/2014   Hypokalemia 02/19/2014   Nausea with vomiting 02/19/2014   Diabetes mellitus, type II (HCC) 02/19/2014   Edema 02/16/2014   Disorder of rotator cuff 11/12/2013   Routine general medical examination at a health care facility 08/20/2013   Gastroesophageal reflux disease 06/26/2013   Dysphagia, pharyngoesophageal phase 06/26/2013   Suprapubic abdominal pain 05/12/2013   Medication side effect 03/25/2013   Diarrhea 03/25/2013   Benign positional vertigo 01/30/2013   Traumatic hematoma of thigh 01/15/2013   HTN (hypertension) 09/02/2012   Dry skin 08/22/2012   External hemorrhoids 08/22/2012   Obesity 06/30/2012   Thyromegaly 05/22/2012   Back pain 05/22/2012   Elevated glucose 05/22/2012   Moderate persistent asthma 04/19/2012   Dyspnea 01/28/2012   Past Medical History:  Diagnosis Date   Arthritis    Asthma    Complication of anesthesia    one time woke up and was vey scared,17 yrs ago   Deaf    Diabetes mellitus without complication (HCC)    GERD (gastroesophageal reflux disease)    Hypertension    Left groin pain    Nausea with vomiting 02/19/2014   Thyroid  disease    Past Surgical History:  Procedure Laterality Date   CARDIAC CATHETERIZATION     CESAREAN SECTION     CHOLECYSTECTOMY N/A 06/20/2016   Procedure: LAPAROSCOPIC CHOLECYSTECTOMY;  Surgeon: Donnice Bury, MD;  Location: [facility] OR;   Service: General;  Laterality: N/A;   ESOPHAGEAL MANOMETRY N/A 09/29/2013   Procedure: ESOPHAGEAL MANOMETRY (EM);  Surgeon: Toribio SHAUNNA Cedar, MD;  Location: [facility] ENDOSCOPY;  Service: Endoscopy;  Laterality: N/A;   TOTAL HIP ARTHROPLASTY Left 02/21/2017   Procedure: LEFT TOTAL HIP ARTHROPLASTY ANTERIOR APPROACH;  Surgeon: Kay CHRISTELLA Cummins, MD;  Location: [facility] OR;  Service: Orthopedics;  Laterality: Left;   Social History   Tobacco Use   Smoking status: Never   Smokeless tobacco: Never  Vaping Use   Vaping status: Never Used  Substance Use Topics   Alcohol use: No    Alcohol/week: 0.0 standard drinks of alcohol   Drug use: No   Social History   Socioeconomic History   Marital status: Married    Spouse name: Not on file   Number of children: Not on file   Years of education: Not on file   Highest education level: Not on file  Occupational History   Occupation: disabled  Tobacco Use   Smoking status: Never   Smokeless tobacco: Never  Vaping Use   Vaping status: Never Used  Substance and Sexual Activity   Alcohol use: No    Alcohol/week: 0.0 standard drinks of alcohol   Drug use: No   Sexual activity: Not Currently  Other Topics Concern   Not on file  Social History Narrative   Lives with family.  Has 3 children.  Works for [employer]  Education: high school.   Social Drivers of Corporate investment banker Strain: Not on file  Food Insecurity: Not on file  Transportation Needs: Not on file  Physical Activity: Not on file  Stress: Not on file  Social Connections: Unknown (03/18/2022)   Received from [facility]   Social Network    Social Network: Not on file  Intimate Partner Violence: Unknown (02/08/2022)   Received from [facility]   HITS    Physically Hurt: Not on file    Insult or Talk Down To: Not on file    Threaten Physical Harm: Not on file    Scream or Curse: Not on file   Family Status  Relation Name Status   Mother  (Not Specified)   Father  (Not Specified)    Neg Hx  (Not Specified)  No partnership data on file   Family History  Problem Relation Age of Onset   Diabetes Mother    Heart disease Mother    Diabetes Father    Colon cancer Neg Hx    Colon polyps Neg Hx    Esophageal cancer Neg Hx    Rectal cancer Neg Hx    Stomach cancer Neg Hx    Allergies  Allergen Reactions   Losartan  Shortness Of Breath   Oxycodone -Acetaminophen  Nausea And Vomiting   Augmentin  [Amoxicillin -Pot Clavulanate] Diarrhea      ROS    Objective:     BP 110/80 (BP Location: Right Arm, Patient Position: Sitting, Cuff Size: Large)   Pulse 83   Temp 97.6 F (36.4 C) (Oral)   Resp 18   Ht 5' 1 (1.549 m)   Wt 210 lb (95.3 kg)   LMP 05/20/2012   SpO2 98%   BMI 39.68 kg/m  BP Readings from Last 3 Encounters:  05/02/24 110/80  04/01/24 124/88  03/26/24 (!) 145/65   Wt Readings from Last 3 Encounters:  05/02/24 210 lb (95.3 kg)  04/01/24 210 lb (95.3 kg)  03/26/24 209 lb 7 oz (95 kg)   SpO2 Readings from Last 3 Encounters:  05/02/24 98%  04/01/24 99%  03/26/24 97%      Physical Exam   No results found for any visits on 05/02/24.  Last CBC Lab Results  Component Value Date   WBC 7.3 04/01/2024   HGB 14.4 04/01/2024   HCT 44.0 04/01/2024   MCV 84.8 04/01/2024   MCH 28.1 03/26/2024   RDW 15.1 04/01/2024   PLT 178.0 04/01/2024   Last metabolic panel Lab Results  Component Value Date   GLUCOSE 97 04/01/2024   NA 140 04/01/2024   K 3.4 (L) 04/01/2024   CL 99 04/01/2024   CO2 33 (H) 04/01/2024   BUN 16 04/01/2024   CREATININE 0.84 04/01/2024   GFR 75.46 04/01/2024   CALCIUM  10.0 04/01/2024   PHOS 3.0 03/07/2022   PROT 6.7 04/01/2024   ALBUMIN 4.0 04/01/2024   BILITOT 0.6 04/01/2024   ALKPHOS 112 04/01/2024   AST 15 04/01/2024   ALT 13 04/01/2024   ANIONGAP 10 03/26/2024   Last lipids Lab Results  Component Value Date   CHOL 149 04/01/2024   HDL 57.00 04/01/2024   LDLCALC 74 04/01/2024   TRIG 88.0 04/01/2024    CHOLHDL 3 04/01/2024   Last hemoglobin A1c Lab Results  Component Value Date   HGBA1C 6.4 04/01/2024   Last thyroid  functions Lab  Results  Component Value Date   TSH 1.23 08/18/2022   T4TOTAL 10.7 12/22/2021   Last vitamin D  Lab Results  Component Value Date   VD25OH 52.50 02/08/2016   Last vitamin B12 and Folate Lab Results  Component Value Date   VITAMINB12 868 12/22/2021   FOLATE 19.2 05/14/2019      The 10-year ASCVD risk score (Arnett DK, et al., 2019) is: 7.7%    Assessment & Plan:   Problem List Items Addressed This Visit       Unprioritized   Diabetes mellitus, type II (HCC) - Primary   Hyperlipidemia associated with type 2 diabetes mellitus (HCC)   Encourage heart healthy diet such as MIND or DASH diet, increase exercise, avoid trans fats, simple carbohydrates and processed foods, consider a krill or fish or flaxseed oil cap daily.        Relevant Orders   Microalbumin / creatinine urine ratio   HTN (hypertension)   Well controlled, no changes to meds. Encouraged heart healthy diet such as the DASH diet and exercise as tolerated.        Relevant Orders   Microalbumin / creatinine urine ratio   Other Visit Diagnoses       Primary osteoarthritis of both hips       Relevant Medications   diclofenac  (VOLTAREN ) 75 MG EC tablet   traMADol  (ULTRAM ) 50 MG tablet     Assessment and Plan Assessment & Plan Preoperative Evaluation for Hip Surgery   She is preparing for hip surgery and needs preoperative clearance from cardiology and pulmonology. Appointments with both specialties are scheduled for July 29, but the timing may not allow for both evaluations to be completed on the same day. The cardiology appointment is at 2:40 PM, and the pulmonology appointment is at 2:00 PM, which may not provide sufficient time. It is crucial to complete both evaluations to avoid postponing the surgery. Contact cardiology and pulmonology to reschedule if necessary to ensure  both evaluations are completed before surgery.  Diabetes Mellitus   She has diabetes and requires regular monitoring. A urine test is needed to check for proteinuria, which is important for diabetes management. Recent blood tests in May were satisfactory. Perform a urine test to check for proteinuria.  Peripheral Edema   She reported ankle swelling two weeks ago, which resolved with leg elevation. Currently, there is no visible swelling. Advise continuing to elevate legs to prevent swelling.  General Health Maintenance   She is considering scheduling a mammogram due to left-sided soreness, which is sensitive when sleeping. She is also considering a bone density test and has a dental appointment scheduled for July. Schedule a mammogram due to left-sided soreness. Consider scheduling a bone density test. Attend the dental appointment in July.    No follow-ups on file.    Jamesina Gaugh R Lowne Chase, DO

## 2024-05-06 ENCOUNTER — Encounter: Payer: Self-pay | Admitting: Adult Health

## 2024-05-06 ENCOUNTER — Ambulatory Visit (INDEPENDENT_AMBULATORY_CARE_PROVIDER_SITE_OTHER): Admitting: Adult Health

## 2024-05-06 VITALS — BP 122/72 | HR 76 | Ht 64.0 in | Wt 209.0 lb

## 2024-05-06 DIAGNOSIS — J4540 Moderate persistent asthma, uncomplicated: Secondary | ICD-10-CM

## 2024-05-06 NOTE — Progress Notes (Signed)
 @Patient  ID: Rhonda Morrow, female    DOB: 1961/07/14, 63 y.o.   MRN: [id number]  Chief Complaint  Patient presents with   Follow-up    Referring provider: Antonio Cyndee Jamee JONELLE, *  HPI: 63 yo female never smoker followed for asthma and allergic rhinitis  Patient is Deaf/mute  TEST/EVENTS :  PFt date 01/24/12 shows mixed obstruction - restriction but greater restriction with low dlco   - fev1 1/48L/58%, Ratio 82, TLC 56,  DLCO 14/51%, No BD response   CT chest 02/09/2012 showed no evidence of scarring   Chest xray 11/2022 Bibasilar atlectasis  05/06/2024 Follow up ; Asthma/AR/PreOp clearance  Discussed the use of AI scribe software for clinical note transcription with the patient, who gave verbal consent to proceed. She is accompanied by an interpreter  History of Present Illness   Rhonda Morrow is a 63 year old female who presents for a pre-surgical evaluation.   Her asthma is generally well-controlled with the use of a Trelegy inhaler once daily and albuterol  as needed. She experiences occasional exacerbations, particularly in hot and humid weather, and avoids extreme heat by staying indoors.  She is fully independent.  She denies any flare of cough or wheezing.  She manages her allergies with Zyrtec  and Flonase , . She experiences relief from nasal congestion and prevention of coughing.  She is scheduled for hip surgery due to severe pain in her hip joint, which affects her mood and mobility. She has been taking significant medication for pain management.  She has a history of mild sleep apnea, previously managed with CPAP, which she discontinued due to insurance changes. She experiences dry mouth, which she attributes to her medications and possibly sleep apnea.        Allergies  Allergen Reactions   Losartan  Shortness Of Breath   Oxycodone -Acetaminophen  Nausea And Vomiting   Augmentin  [Amoxicillin -Pot Clavulanate] Diarrhea    Immunization History  Administered  Date(s) Administered   DT (Pediatric) 04/07/2011   Influenza Split 08/07/2011, 08/05/2012, 08/22/2012, 08/31/2014   Influenza, Seasonal, Injecte, Preservative Fre 07/18/2023   Influenza,inj,Quad PF,6+ Mos 08/20/2013, 10/22/2015, 08/01/2016, 09/11/2017, 08/06/2018, 07/15/2019, 07/22/2020, 07/18/2021, 08/18/2022   Influenza,trivalent, recombinat, inj, PF 12/18/2011   PFIZER(Purple Top)SARS-COV-2 Vaccination 12/28/2019, 01/20/2020, 08/23/2020   Pfizer Covid-19 Vaccine Bivalent Booster 1yrs & up 10/11/2021   Pfizer(Comirnaty )Fall Seasonal Vaccine 12 years and older 09/19/2022, 07/18/2023   Pneumococcal Polysaccharide-23 10/02/2012, 07/22/2020   Tdap 12/18/2011   Zoster Recombinant(Shingrix ) 08/17/2020, 10/19/2020, 07/20/2023    Past Medical History:  Diagnosis Date   Arthritis    Asthma    Complication of anesthesia    one time woke up and was vey scared,17 yrs ago   Deaf    Diabetes mellitus without complication (HCC)    GERD (gastroesophageal reflux disease)    Hypertension    Left groin pain    Nausea with vomiting 02/19/2014   Thyroid  disease     Tobacco History: Social History   Tobacco Use  Smoking Status Never  Smokeless Tobacco Never   Counseling given: Not Answered   Outpatient Medications Prior to Visit  Medication Sig Dispense Refill   albuterol  (VENTOLIN  HFA) 108 (90 Base) MCG/ACT inhaler Inhale 1-2 puffs into the lungs every 6 (six) hours as needed for wheezing or shortness of breath. J45.40 1 each 3   allopurinol  (ZYLOPRIM ) 100 MG tablet Take 1 tablet (100 mg total) by mouth daily. 30 tablet 0   amLODipine  (NORVASC ) 5 MG tablet Take 1 tablet (5 mg total)  by mouth daily. 90 tablet 0   atorvastatin  (LIPITOR) 20 MG tablet TAKE 1 TABLET BY MOUTH EVERY DAY 90 tablet 1   Azelastine  HCl 137 MCG/SPRAY SOLN PLACE 2 SPRAYS INTO BOTH NOSTRILS 2 (TWO) TIMES DAILY. USE IN EACH NOSTRIL AS DIRECTED (Patient taking differently: as needed.) 30 mL 3   blood glucose meter kit  and supplies KIT Dispense based on patient and insurance preference. Use up to four times daily as directed. (FOR ICD-9 250.00, 250.01). 1 each 0   cetirizine  (ZYRTEC ) 10 MG tablet TAKE 1 TABLET BY MOUTH EVERY DAY 90 tablet 2   diclofenac  (VOLTAREN ) 75 MG EC tablet Take 1 tablet (75 mg total) by mouth 2 (two) times daily. 60 tablet 0   fluticasone  (FLONASE ) 50 MCG/ACT nasal spray Place 2 sprays into both nostrils daily. 16 g 6   fluticasone  (FLONASE ) 50 MCG/ACT nasal spray SPRAY 2 SPRAYS INTO EACH NOSTRIL EVERY DAY 16 mL 6   Fluticasone -Umeclidin-Vilant (TRELEGY ELLIPTA ) 100-62.5-25 MCG/ACT AEPB Inhale 1 puff into the lungs daily. 1 each 5   lansoprazole (PREVACID) 30 MG capsule Take 1 capsule (30 mg total) by mouth daily at 12 noon. 90 capsule 1   LORazepam  (ATIVAN ) 0.5 MG tablet 1-2 tabs 30 - 60 min prior to MRI. Do not drive with this medicine. 4 tablet 0   meclizine  (ANTIVERT ) 25 MG tablet Take 1 tablet (25 mg total) by mouth 3 (three) times daily as needed for dizziness. 20 tablet 0   metFORMIN  (GLUCOPHAGE -XR) 500 MG 24 hr tablet TAKE 2 TABLETS BY MOUTH EVERY DAY WITH BREAKFAST 180 tablet 1   potassium chloride  (KLOR-CON ) 10 MEQ tablet Take 1 tablet (10 mEq total) by mouth 2 (two) times daily. 60 tablet 2   potassium chloride  SA (KLOR-CON  M) 20 MEQ tablet Take 1 tablet (20 mEq total) by mouth 2 (two) times daily. 6 tablet 0   Semaglutide  (RYBELSUS ) 7 MG TABS Take 1 tablet (7 mg total) by mouth daily. 90 tablet 1   solifenacin  (VESICARE ) 5 MG tablet TAKE 1 TABLET (5 MG TOTAL) BY MOUTH DAILY. 90 tablet 1   terconazole  (TERAZOL 7 ) 0.4 % vaginal cream PLACE 1 APPLICATOR VAGINALLY AT BEDTIME. 45 g 0   traMADol  (ULTRAM ) 50 MG tablet Take 1 tablet (50 mg total) by mouth every 12 (twelve) hours as needed for severe pain (pain score 7-10). 60 tablet 0   Facility-Administered Medications Prior to Visit  Medication Dose Route Frequency Provider Last Rate Last Admin   alum & mag hydroxide-simeth  (MAALOX/MYLANTA) 200-200-20 MG/5ML suspension 30 mL  30 mL Oral Once Saguier, Edward, PA-C       lidocaine  (XYLOCAINE ) 2 % viscous mouth solution 15 mL  15 mL Mouth/Throat Once Saguier, Edward, PA-C       nitroGLYCERIN  (NITRODUR - Dosed in mg/24 hr) patch 0.2 mg  0.2 mg Transdermal Daily Magdalen Pasco RAMAN, DPM         Review of Systems:   Constitutional:   No  weight loss, night sweats,  Fevers, chills, +fatigue, or  lassitude.  HEENT:   No headaches,  Difficulty swallowing,  Tooth/dental problems, or  Sore throat,                No sneezing, itching, ear ache, +nasal congestion, post nasal drip,   CV:  No chest pain,  Orthopnea, PND, swelling in lower extremities, anasarca, dizziness, palpitations, syncope.   GI  No heartburn, indigestion, abdominal pain, nausea, vomiting, diarrhea, change in bowel habits, loss  of appetite, bloody stools.   Resp: .  No chest wall deformity  Skin: no rash or lesions.  GU: no dysuria, change in color of urine, no urgency or frequency.  No flank pain, no hematuria   MS: Hip pain   Physical Exam  BP 122/72 (Patient Position: Sitting, Cuff Size: Large)   Pulse 76   Ht 5' 4 (1.626 m)   Wt 209 lb (94.8 kg)   LMP 05/20/2012   SpO2 98%   BMI 35.87 kg/m   GEN: A/Ox3; pleasant , NAD, well nourished    HEENT:  [place]/AT,   NOSE-clear, THROAT-clear, no lesions, no postnasal drip or exudate noted.   NECK:  Supple w/ fair ROM; no JVD; normal carotid impulses w/o bruits; no thyromegaly or nodules palpated; no lymphadenopathy.    RESP  Clear  P & A; w/o, wheezes/ rales/ or rhonchi. no accessory muscle use, no dullness to percussion  CARD:  RRR, no m/r/g, no peripheral edema, pulses intact, no cyanosis or clubbing.  GI:   Soft & nt; nml bowel sounds; no organomegaly or masses detected.   Musco: Warm bil, no deformities or joint swelling noted.   Neuro: alert, no focal deficits noted.    Skin: Warm, no lesions or rashes    Lab  Results:    BNP   Imaging: No results found.  Administration History     None          Latest Ref Rng & Units 01/22/2019    2:35 PM 07/21/2013   12:03 PM  PFT Results  FVC-Pre L 1.60    FVC-Predicted Pre % 61  56      FVC-Post L 1.66    FVC-Predicted Post % 63    Pre FEV1/FVC % % 89  89      Post FEV1/FCV % % 88    FEV1-Pre L 1.42  1.37      FEV1-Predicted Pre % 69  62      FEV1-Post L 1.45    DLCO uncorrected ml/min/mmHg 17.81    DLCO UNC% % 91    DLCO corrected ml/min/mmHg 17.03    DLCO COR %Predicted % 87    DLVA Predicted % 148    TLC L 3.13    TLC % Predicted % 65    RV % Predicted % 65       This result is from an external source.    No results found for: NITRICOXIDE      Assessment & Plan:   No problem-specific Assessment & Plan notes found for this encounter.   Assessment and Plan    Asthma   Asthma is well-controlled with her current medication regimen. She experiences occasional exacerbations, particularly in hot and humid conditions, but manages these with her inhaler. Continue Trelegy inhaler once daily and use albuterol  inhaler as needed for exacerbations. Advise avoiding extreme heat and humidity to prevent exacerbations.  Asthma action plan discussed.Check PFT on return OV   Allergic Rhinitis -Zyrtec  and Flonase  daily .   Right hip Surgery  Severe hip joint pain due to deteriorating osteoarthritis significantly affects her mobility and mood. She is scheduled for surgery to address the issue. Pulmonary preop risk assessment  -patient would be considered a mild to moderate surgical risk from a pulmonary standpoint.  Currently asthma appears to be well-controlled.  She does have mild obstructive sleep apnea not on CPAP.   1) RISK FOR PROLONGED MECHANICAL VENTILAION - > 48h  1A) Arozullah - Prolonged  mech ventilation risk Arozullah Postperative Pulmonary Risk Score - for mech ventilation dependence >48h [employer], Ann Surg 2000,  major non-cardiac surgery) Comment Score  Type of surgery - abd ao aneurysm (27), thoracic (21), neurosurgery / upper abdominal / vascular (21), neck (11) Hip  8  Emergency Surgery - (11)  0  ALbumin < 3 or poor nutritional state - (9)  0  BUN > 30 -  (8)  0  Partial or completely dependent functional status - (7)  0  COPD -  (6)  0  Age - 60 to 69 (4), > 70  (6)  4  TOTAL    Risk Stratifcation scores  - < 10 (0.5%), 11-19 (1.8%), 20-27 (4.2%), 28-40 (10.1%), >40 (26.6%)  12      Major Pulmonary risks identified in the multifactorial risk analysis are but not limited to a) pneumonia; b) recurrent intubation risk; c) prolonged or recurrent acute respiratory failure needing mechanical ventilation; d) prolonged hospitalization; e) DVT/Pulmonary embolism; f) Acute Pulmonary edema  Recommend 1. Short duration of surgery as much as possible and avoid paralytic if possible 2. CPAP in post op setting if indicated.  3. DVT prophylaxis per protocol  4. Aggressive pulmonary toilet with o2, bronchodilatation, and incentive spirometry and early ambulation   Mild sleep apnea   Previously diagnosed mild sleep apnea, with CPAP therapy discontinued due to insurance issues. She experiences dry mouth, possibly related to sleep apnea or medication side effects. Document mild sleep apnea for surgical team awareness and consider re-evaluation  on return visit if continues to have ongoing symptoms will need home sleep study.         Madelin Stank, NP 05/06/2024

## 2024-05-06 NOTE — Patient Instructions (Addendum)
 Trelegy 1 puff daily, Rinse after use  Albuterol  inhaler As needed   Continue on Zyrtec  and Flonase  daily  Good luck with upcoming surgery  Follow up in Dr. Annella or Rielle Schlauch NP in 6 months with PFT  Please contact office for sooner follow up if symptoms do not improve or worsen or seek emergency care

## 2024-05-14 ENCOUNTER — Other Ambulatory Visit: Payer: Self-pay | Admitting: Family Medicine

## 2024-05-14 ENCOUNTER — Other Ambulatory Visit (INDEPENDENT_AMBULATORY_CARE_PROVIDER_SITE_OTHER)

## 2024-05-14 ENCOUNTER — Other Ambulatory Visit: Payer: Self-pay

## 2024-05-14 DIAGNOSIS — E1165 Type 2 diabetes mellitus with hyperglycemia: Secondary | ICD-10-CM | POA: Diagnosis not present

## 2024-05-14 DIAGNOSIS — M10079 Idiopathic gout, unspecified ankle and foot: Secondary | ICD-10-CM

## 2024-05-14 DIAGNOSIS — E1169 Type 2 diabetes mellitus with other specified complication: Secondary | ICD-10-CM

## 2024-05-14 DIAGNOSIS — I10 Essential (primary) hypertension: Secondary | ICD-10-CM

## 2024-05-14 NOTE — Addendum Note (Signed)
 Addended by: ESTELLE GILLIS D on: 05/14/2024 02:57 PM   Modules accepted: Orders

## 2024-05-14 NOTE — Addendum Note (Signed)
 Addended by: ESTELLE GILLIS D on: 05/14/2024 02:55 PM   Modules accepted: Orders

## 2024-05-14 NOTE — Addendum Note (Signed)
 Addended by: ESTELLE GILLIS D on: 05/14/2024 03:45 PM   Modules accepted: Orders

## 2024-05-15 ENCOUNTER — Ambulatory Visit: Payer: Self-pay | Admitting: Family Medicine

## 2024-05-15 LAB — MICROALBUMIN / CREATININE URINE RATIO
Creatinine,U: 95.5 mg/dL
Microalb Creat Ratio: UNDETERMINED mg/g (ref 0.0–30.0)
Microalb, Ur: <0.7 mg/dL

## 2024-06-02 DIAGNOSIS — H9190 Unspecified hearing loss, unspecified ear: Secondary | ICD-10-CM | POA: Insufficient documentation

## 2024-06-02 DIAGNOSIS — M109 Gout, unspecified: Secondary | ICD-10-CM | POA: Insufficient documentation

## 2024-06-03 ENCOUNTER — Ambulatory Visit: Attending: Cardiology | Admitting: Cardiology

## 2024-06-03 ENCOUNTER — Encounter: Payer: Self-pay | Admitting: Cardiology

## 2024-06-03 ENCOUNTER — Ambulatory Visit: Admitting: Adult Health

## 2024-06-03 VITALS — BP 139/80 | HR 75 | Ht 64.0 in | Wt 211.0 lb

## 2024-06-03 DIAGNOSIS — E1149 Type 2 diabetes mellitus with other diabetic neurological complication: Secondary | ICD-10-CM | POA: Diagnosis not present

## 2024-06-03 DIAGNOSIS — I10 Essential (primary) hypertension: Secondary | ICD-10-CM | POA: Diagnosis not present

## 2024-06-03 DIAGNOSIS — R0609 Other forms of dyspnea: Secondary | ICD-10-CM

## 2024-06-03 DIAGNOSIS — G4733 Obstructive sleep apnea (adult) (pediatric): Secondary | ICD-10-CM

## 2024-06-03 DIAGNOSIS — Z01810 Encounter for preprocedural cardiovascular examination: Secondary | ICD-10-CM | POA: Diagnosis not present

## 2024-06-03 NOTE — Progress Notes (Unsigned)
 Cardiology Consultation:    Date:  06/03/2024   ID:  Rhonda Morrow, DOB 07/01/1961, MRN [id number]  PCP:  Antonio Meth, Jamee SAUNDERS, DO  Cardiologist:  Lamar Fitch, MD   Referring MD: Antonio Meth, Jamee SAUNDERS, *   Chief Complaint  Patient presents with   Clearance TBD    History of Present Illness:    Rhonda Morrow is a 63 y.o. female who is being seen today for the evaluation of cardiovascular evaluation at the request of Antonio Meth Jamee SAUNDERS, *.  Past medical history significant for diabetes, essential hypertension, dyslipidemia, obstructive sleep apnea, she is deaf so we used sign language interpreter for this discussion.  She was referred to me because she required right hip replacement surgery.  Obviously her ability to exercise is limited.  She gets short of breath quite easily she also described to have some chest pain sometimes sometimes with exercise sometimes at rest.  She never had any heart trouble.  Apparently she was evaluated years ago and that evaluation was negative.  Also recently she ended up being in the hospital emergency room she was found to have ventricular ectopy.  No recent passing out or dizziness.  She was told to have vertigo and since that time she is doing well.  She does not smoke, never did does have some family history of but not premature coronary disease  Past Medical History:  Diagnosis Date   Arthritis    Asthma    Complication of anesthesia    one time woke up and was vey scared,17 yrs ago   Deaf    Diabetes mellitus without complication (HCC)    GERD (gastroesophageal reflux disease)    Hypertension    Left groin pain    Nausea with vomiting 02/19/2014   Thyroid  disease     Past Surgical History:  Procedure Laterality Date   CARDIAC CATHETERIZATION     CESAREAN SECTION     CHOLECYSTECTOMY N/A 06/20/2016   Procedure: LAPAROSCOPIC CHOLECYSTECTOMY;  Surgeon: Donnice Bury, MD;  Location: [facility] OR;  Service: General;  Laterality: N/A;    ESOPHAGEAL MANOMETRY N/A 09/29/2013   Procedure: ESOPHAGEAL MANOMETRY (EM);  Surgeon: Toribio SHAUNNA Cedar, MD;  Location: [facility] ENDOSCOPY;  Service: Endoscopy;  Laterality: N/A;   TOTAL HIP ARTHROPLASTY Left 02/21/2017   Procedure: LEFT TOTAL HIP ARTHROPLASTY ANTERIOR APPROACH;  Surgeon: Kay CHRISTELLA Cummins, MD;  Location: [facility] OR;  Service: Orthopedics;  Laterality: Left;    Current Medications: Current Meds  Medication Sig   albuterol  (VENTOLIN  HFA) 108 (90 Base) MCG/ACT inhaler Inhale 1-2 puffs into the lungs every 6 (six) hours as needed for wheezing or shortness of breath. J45.40   allopurinol  (ZYLOPRIM ) 100 MG tablet Take 1 tablet (100 mg total) by mouth daily.   amLODipine  (NORVASC ) 5 MG tablet Take 1 tablet (5 mg total) by mouth daily.   atorvastatin  (LIPITOR) 20 MG tablet TAKE 1 TABLET BY MOUTH EVERY DAY   Azelastine  HCl 137 MCG/SPRAY SOLN PLACE 2 SPRAYS INTO BOTH NOSTRILS 2 (TWO) TIMES DAILY. USE IN EACH NOSTRIL AS DIRECTED (Patient taking differently: as needed.)   blood glucose meter kit and supplies KIT Dispense based on patient and insurance preference. Use up to four times daily as directed. (FOR ICD-9 250.00, 250.01).   cetirizine  (ZYRTEC ) 10 MG tablet TAKE 1 TABLET BY MOUTH EVERY DAY   diclofenac  (VOLTAREN ) 75 MG EC tablet Take 1 tablet (75 mg total) by mouth 2 (two) times daily.   fluticasone  (FLONASE )  50 MCG/ACT nasal spray Place 2 sprays into both nostrils daily.   fluticasone  (FLONASE ) 50 MCG/ACT nasal spray SPRAY 2 SPRAYS INTO EACH NOSTRIL EVERY DAY   Fluticasone -Umeclidin-Vilant (TRELEGY ELLIPTA ) 100-62.5-25 MCG/ACT AEPB Inhale 1 puff into the lungs daily.   lansoprazole (PREVACID) 30 MG capsule Take 1 capsule (30 mg total) by mouth daily at 12 noon.   LORazepam  (ATIVAN ) 0.5 MG tablet 1-2 tabs 30 - 60 min prior to MRI. Do not drive with this medicine.   meclizine  (ANTIVERT ) 25 MG tablet Take 1 tablet (25 mg total) by mouth 3 (three) times daily as needed for dizziness.   metFORMIN   (GLUCOPHAGE -XR) 500 MG 24 hr tablet TAKE 2 TABLETS BY MOUTH EVERY DAY WITH BREAKFAST   potassium chloride  (KLOR-CON ) 10 MEQ tablet Take 1 tablet (10 mEq total) by mouth 2 (two) times daily.   potassium chloride  SA (KLOR-CON  M) 20 MEQ tablet Take 1 tablet (20 mEq total) by mouth 2 (two) times daily.   Semaglutide  (RYBELSUS ) 7 MG TABS Take 1 tablet (7 mg total) by mouth daily.   solifenacin  (VESICARE ) 5 MG tablet TAKE 1 TABLET (5 MG TOTAL) BY MOUTH DAILY.   terconazole  (TERAZOL 7 ) 0.4 % vaginal cream PLACE 1 APPLICATOR VAGINALLY AT BEDTIME.   traMADol  (ULTRAM ) 50 MG tablet Take 1 tablet (50 mg total) by mouth every 12 (twelve) hours as needed for severe pain (pain score 7-10).   Current Facility-Administered Medications for the 06/03/24 encounter (Office Visit) with Javonta Gronau J, MD  Medication   alum & mag hydroxide-simeth (MAALOX/MYLANTA) 200-200-20 MG/5ML suspension 30 mL   lidocaine  (XYLOCAINE ) 2 % viscous mouth solution 15 mL   nitroGLYCERIN  (NITRODUR - Dosed in mg/24 hr) patch 0.2 mg     Allergies:   Losartan , Oxycodone -acetaminophen , and Augmentin  [amoxicillin -pot clavulanate]   Social History   Socioeconomic History   Marital status: Married    Spouse name: Not on file   Number of children: Not on file   Years of education: Not on file   Highest education level: Not on file  Occupational History   Occupation: disabled  Tobacco Use   Smoking status: Never   Smokeless tobacco: Never  Vaping Use   Vaping status: Never Used  Substance and Sexual Activity   Alcohol use: No    Alcohol/week: 0.0 standard drinks of alcohol   Drug use: No   Sexual activity: Not Currently  Other Topics Concern   Not on file  Social History Narrative   Lives with family.  Has 3 children.  Works for [employer]  Education: high school.   Social Drivers of Corporate investment banker Strain: Not on file  Food Insecurity: Not on file  Transportation Needs: Not on file  Physical Activity:  Not on file  Stress: Not on file  Social Connections: Unknown (03/18/2022)   Received [facility]   Social Network    Social Network: Not on file     Family History: The patient's family history includes Diabetes in her father and mother; Heart disease in her mother. There is no history of Colon cancer, Colon polyps, Esophageal cancer, Rectal cancer, or Stomach cancer. ROS:   Please see the history of present illness.    All 14 point review of systems negative except as described per history of present illness.  EKGs/Labs/Other Studies Reviewed:    The following studies were reviewed today:   EKG:  EKG Interpretation Date/Time:  Tuesday June 03 2024 14:42:57 EDT Ventricular Rate:  98  PR Interval:  138 QRS Duration:  94 QT Interval:  394 QTC Calculation: 503 R Axis:   -50  Text Interpretation: Sinus rhythm with frequent Premature ventricular complexes in a pattern of bigeminy Left anterior fascicular block Possible Anterolateral infarct , age undetermined When compared with ECG of 26-Mar-2024 18:35, PREVIOUS ECG IS PRESENT Confirmed by Bernie Charleston [phone]) on 06/03/2024 2:51:51 PM    Recent Labs: 04/01/2024: ALT 13; BUN 16; Creatinine, Ser 0.84; Hemoglobin 14.4; Platelets 178.0; Potassium 3.4; Sodium 140  Recent Lipid Panel    Component Value Date/Time   CHOL 149 04/01/2024 1548   TRIG 88.0 04/01/2024 1548   HDL 57.00 04/01/2024 1548   CHOLHDL 3 04/01/2024 1548   VLDL 17.6 04/01/2024 1548   LDLCALC 74 04/01/2024 1548   LDLCALC 38 07/20/2023 1407    Physical Exam:    VS:  BP 139/80 (BP Location: Left Arm, Patient Position: Sitting)   Pulse 75   Ht 5' 4 (1.626 m)   Wt 211 lb (95.7 kg)   LMP 05/20/2012   SpO2 95%   BMI 36.22 kg/m     Wt Readings from Last 3 Encounters:  06/03/24 211 lb (95.7 kg)  05/06/24 209 lb (94.8 kg)  05/02/24 210 lb (95.3 kg)     GEN:  Well nourished, well developed in no acute distress HEENT: Normal NECK: No JVD; No carotid  bruits LYMPHATICS: No lymphadenopathy CARDIAC: RRR, no murmurs, no rubs, no gallops RESPIRATORY:  Clear to auscultation without rales, wheezing or rhonchi  ABDOMEN: Soft, non-tender, non-distended MUSCULOSKELETAL:  No edema; No deformity  SKIN: Warm and dry NEUROLOGIC:  Alert and oriented x 3 PSYCHIATRIC:  Normal affect   ASSESSMENT:    1. Primary hypertension   2. Preop cardiovascular exam   3. OSA (obstructive sleep apnea)   4. 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 Dyspnea on exertion    PLAN:    In order of problems listed above:  Cardiovascular evaluation for this lady before elective right hip surgery.  Her ability to exercise is limited, she cannot do 4 METS, therefore, we need to evaluate her for potential cardiac issues also she is diabetic?  Her most likely to have coronary disease and we we will do a Lexiscan  to see if she get any obstructive disease.  As a part of her evaluation as well as because of shortness of breath I asked her to have an echocardiogram done to assess left ventricle ejection fraction.  I also asked her to start taking baby aspirin  however will start doing this after her surgery. Diabetes that being followed by antimedicine team, last hemoglobin A1c 6.4%.  Which is relatively controlled continue present management. Dyslipidemia I did review K PN show me LDL 74 HDL 57 good control continue present management   Medication Adjustments/Labs and Tests Ordered: Current medicines are reviewed at length with the patient today.  Concerns regarding medicines are outlined above.  Orders Placed This Encounter  Procedures   EKG 12-Lead   No orders of the defined types were placed in this encounter.   Signed, [place] DOROTHA Bernie, MD, [facility] 06/03/2024 3:20 PM    [facility]

## 2024-06-03 NOTE — Patient Instructions (Signed)
Medication Instructions:  Your physician recommends that you continue on your current medications as directed. Please refer to the Current Medication list given to you today.  *If you need a refill on your cardiac medications before your next appointment, please call your pharmacy*   Lab Work: None ordered If you have labs (blood work) drawn today and your tests are completely normal, you will receive your results only by: MyChart Message (if you have MyChart) OR A paper copy in the mail If you have any lab test that is abnormal or we need to change your treatment, we will call you to review the results.   Testing/Procedures: Your physician has requested that you have a lexiscan myoview. For further information please visit [email or web address]. Please follow instruction sheet, as given.  The test will take approximately 3 to 4 hours to complete; you may bring reading material.  If someone comes with you to your appointment, they will need to remain in the main lobby due to limited space in the testing area. **If you are pregnant or breastfeeding, please notify the nuclear lab prior to your appointment**  How to prepare for your Myocardial Perfusion Test: Do not eat or drink 3 hours prior to your test, except you may have water. Do not consume products containing caffeine (regular or decaffeinated) 12 hours prior to your test. (ex: coffee, chocolate, sodas, tea). Do bring a list of your current medications with you.  If not listed below, you may take your medications as normal. Do wear comfortable clothes (no dresses or overalls) and walking shoes, tennis shoes preferred (No heels or open toe shoes are allowed). Do NOT wear cologne, perfume, aftershave, or lotions (deodorant is allowed). If these instructions are not followed, your test will have to be rescheduled.  Your physician has requested that you have an echocardiogram. Echocardiography is a painless test that uses sound waves to  create images of your heart. It provides your doctor with information about the size and shape of your heart and how well your heart's chambers and valves are working. This procedure takes approximately one hour. There are no restrictions for this procedure.   Follow-Up: At [facility], you and your health needs are our priority.  As part of our continuing mission to provide you with exceptional heart care, we have created designated Provider Care Teams.  These Care Teams include your primary Cardiologist (physician) and Advanced Practice Providers (APPs -  Physician Assistants and Nurse Practitioners) who all work together to provide you with the care you need, when you need it.  We recommend signing up for the patient portal called "MyChart".  Sign up information is provided on this After Visit Summary.  MyChart is used to connect with patients for Virtual Visits (Telemedicine).  Patients are able to view lab/test results, encounter notes, upcoming appointments, etc.  Non-urgent messages can be sent to your provider as well.   To learn more about what you can do with MyChart, go to [email or web address].    Your next appointment:   6 month(s)  The format for your next appointment:   In Person  Provider:   Robert Krasowski, MD   Other Instructions Cardiac Nuclear Scan A cardiac nuclear scan is a test that is done to check the flow of blood to your heart. It is done when you are resting and when you are exercising. The test looks for problems such as: Not enough blood reaching a portion of the heart. The heart muscle not working as   it should. You may need this test if: You have heart disease. You have had lab results that are not normal. You have had heart surgery or a balloon procedure to open up blocked arteries (angioplasty). You have chest pain. You have shortness of breath. In this test, a special dye (tracer) is put into your bloodstream. The tracer will travel to your heart.  A camera will then take pictures of your heart to see how the tracer moves through your heart. This test is usually done at a hospital and takes 2-4 hours. Tell a doctor about: Any allergies you have. All medicines you are taking, including vitamins, herbs, eye drops, creams, and over-the-counter medicines. Any problems you or family members have had with anesthetic medicines. Any blood disorders you have. Any surgeries you have had. Any medical conditions you have. Whether you are pregnant or may be pregnant. What are the risks? Generally, this is a safe test. However, problems may occur, such as: Serious chest pain and heart attack. This is only a risk if the stress portion of the test is done. Rapid heartbeat. A feeling of warmth in your chest. This feeling usually does not last long. Allergic reaction to the tracer. What happens before the test? Ask your doctor about changing or stopping your normal medicines. This is important. Follow instructions from your doctor about what you cannot eat or drink. Remove your jewelry on the day of the test. What happens during the test? An IV tube will be inserted into one of your veins. Your doctor will give you a small amount of tracer through the IV tube. You will wait for 20-40 minutes while the tracer moves through your bloodstream. Your heart will be monitored with an electrocardiogram (ECG). You will lie down on an exam table. Pictures of your heart will be taken for about 15-20 minutes. You may also have a stress test. For this test, one of these things may be done: You will be asked to exercise on a treadmill or a stationary bike. You will be given medicines that will make your heart work harder. This is done if you are unable to exercise. When blood flow to your heart has peaked, a tracer will again be given through the IV tube. After 20-40 minutes, you will get back on the exam table. More pictures will be taken of your  heart. Depending on the tracer that is used, more pictures may need to be taken 3-4 hours later. Your IV tube will be removed when the test is over. The test may vary among doctors and hospitals. What happens after the test? Ask your doctor: Whether you can return to your normal schedule, including diet, activities, and medicines. Whether you should drink more fluids. This will help to remove the tracer from your body. Drink enough fluid to keep your pee (urine) pale yellow. Ask your doctor, or the department that is doing the test: When will my results be ready? How will I get my results? Summary A cardiac nuclear scan is a test that is done to check the flow of blood to your heart. Tell your doctor whether you are pregnant or may be pregnant. Before the test, ask your doctor about changing or stopping your normal medicines. This is important. Ask your doctor whether you can return to your normal activities. You may be asked to drink more fluids. This information is not intended to replace advice given to you by your health care provider. Make sure you discuss any   questions you have with your health care provider. Document Revised: 02/12/2019 Document Reviewed: 04/08/2018 Elsevier Patient Education  2021 Elsevier Inc.    Echocardiogram An echocardiogram is a test that uses sound waves (ultrasound) to produce images of the heart. Images from an echocardiogram can provide important information about: Heart size and shape. The size and thickness and movement of your heart's walls. Heart muscle function and strength. Heart valve function or if you have stenosis. Stenosis is when the heart valves are too narrow. If blood is flowing backward through the heart valves (regurgitation). A tumor or infectious growth around the heart valves. Areas of heart muscle that are not working well because of poor blood flow or injury from a heart attack. Aneurysm detection. An aneurysm is a weak or  damaged part of an artery wall. The wall bulges out from the normal force of blood pumping through the body. Tell a health care provider about: Any allergies you have. All medicines you are taking, including vitamins, herbs, eye drops, creams, and over-the-counter medicines. Any blood disorders you have. Any surgeries you have had. Any medical conditions you have. Whether you are pregnant or may be pregnant. What are the risks? Generally, this is a safe test. However, problems may occur, including an allergic reaction to dye (contrast) that may be used during the test. What happens before the test? No specific preparation is needed. You may eat and drink normally. What happens during the test? You will take off your clothes from the waist up and put on a hospital gown. Electrodes or electrocardiogram (ECG)patches may be placed on your chest. The electrodes or patches are then connected to a device that monitors your heart rate and rhythm. You will lie down on a table for an ultrasound exam. A gel will be applied to your chest to help sound waves pass through your skin. A handheld device, called a transducer, will be pressed against your chest and moved over your heart. The transducer produces sound waves that travel to your heart and bounce back (or "echo" back) to the transducer. These sound waves will be captured in real-time and changed into images of your heart that can be viewed on a video monitor. The images will be recorded on a computer and reviewed by your health care provider. You may be asked to change positions or hold your breath for a short time. This makes it easier to get different views or better views of your heart. In some cases, you may receive contrast through an IV in one of your veins. This can improve the quality of the pictures from your heart. The procedure may vary among health care providers and hospitals.    What can I expect after the test? You may return to your  normal, everyday life, including diet, activities, and medicines, unless your health care provider tells you not to do that. Follow these instructions at home: It is up to you to get the results of your test. Ask your health care provider, or the department that is doing the test, when your results will be ready. Keep all follow-up visits. This is important. Summary An echocardiogram is a test that uses sound waves (ultrasound) to produce images of the heart. Images from an echocardiogram can provide important information about the size and shape of your heart, heart muscle function, heart valve function, and other possible heart problems. You do not need to do anything to prepare before this test. You may eat and drink normally. After   the echocardiogram is completed, you may return to your normal, everyday life, unless your health care provider tells you not to do that. This information is not intended to replace advice given to you by your health care provider. Make sure you discuss any questions you have with your health care provider. Document Revised: 06/15/2020 Document Reviewed: 06/15/2020 Elsevier Patient Education  2021 Elsevier Inc.   

## 2024-06-13 ENCOUNTER — Ambulatory Visit: Admitting: Medical

## 2024-06-13 ENCOUNTER — Encounter: Payer: Self-pay | Admitting: Medical

## 2024-06-13 ENCOUNTER — Ambulatory Visit: Payer: Self-pay | Admitting: Medical

## 2024-06-13 ENCOUNTER — Ambulatory Visit (HOSPITAL_BASED_OUTPATIENT_CLINIC_OR_DEPARTMENT_OTHER)
Admission: RE | Admit: 2024-06-13 | Discharge: 2024-06-13 | Disposition: A | Discharge status: Home / Self Care | Source: Ambulatory Visit | Attending: Medical | Admitting: Medical

## 2024-06-13 VITALS — BP 140/80 | HR 50 | Temp 97.7°F | Resp 15 | Ht 64.0 in | Wt 210.2 lb

## 2024-06-13 DIAGNOSIS — E876 Hypokalemia: Secondary | ICD-10-CM

## 2024-06-13 DIAGNOSIS — M25551 Pain in right hip: Secondary | ICD-10-CM | POA: Diagnosis not present

## 2024-06-13 DIAGNOSIS — R1031 Right lower quadrant pain: Secondary | ICD-10-CM | POA: Insufficient documentation

## 2024-06-13 DIAGNOSIS — M160 Bilateral primary osteoarthritis of hip: Secondary | ICD-10-CM

## 2024-06-13 DIAGNOSIS — R509 Fever, unspecified: Secondary | ICD-10-CM | POA: Diagnosis not present

## 2024-06-13 LAB — COMPREHENSIVE METABOLIC PANEL WITH GFR
ALT: 14 U/L (ref 0–35)
AST: 15 U/L (ref 0–37)
Albumin: 3.7 g/dL (ref 3.5–5.2)
Alkaline Phosphatase: 90 U/L (ref 39–117)
BUN: 15 mg/dL (ref 6–23)
CO2: 32 meq/L (ref 19–32)
Calcium: 9.2 mg/dL (ref 8.4–10.5)
Chloride: 101 meq/L (ref 96–112)
Creatinine, Ser: 0.75 mg/dL (ref 0.40–1.20)
GFR: 86.33 mL/min (ref >60.00)
Glucose, Bld: 127 mg/dL — ABNORMAL HIGH (ref 70–99)
Potassium: 3.3 meq/L — ABNORMAL LOW (ref 3.5–5.1)
Sodium: 142 meq/L (ref 135–145)
Total Bilirubin: 0.4 mg/dL (ref 0.2–1.2)
Total Protein: 6.9 g/dL (ref 6.0–8.3)

## 2024-06-13 LAB — POC URINALSYSI DIPSTICK (AUTOMATED)
Blood, UA: NEGATIVE
Glucose, UA: NEGATIVE
Ketones, UA: NEGATIVE
Leukocytes, UA: NEGATIVE
Nitrite, UA: NEGATIVE
Protein, UA: POSITIVE — AB
Spec Grav, UA: 1.025 (ref 1.010–1.025)
Urobilinogen, UA: 0.2 U/dL
pH, UA: 6 (ref 5.0–8.0)

## 2024-06-13 LAB — CBC WITH DIFFERENTIAL/PLATELET
Basophils Absolute: 0 K/uL (ref 0.0–0.1)
Basophils Relative: 0.7 % (ref 0.0–3.0)
Eosinophils Absolute: 0.1 K/uL (ref 0.0–0.7)
Eosinophils Relative: 2.3 % (ref 0.0–5.0)
HCT: 42.6 % (ref 36.0–46.0)
Hemoglobin: 14 g/dL (ref 12.0–15.0)
Lymphocytes Relative: 30.1 % (ref 12.0–46.0)
Lymphs Abs: 1.9 K/uL (ref 0.7–4.0)
MCHC: 32.9 g/dL (ref 30.0–36.0)
MCV: 84.6 fl (ref 78.0–100.0)
Monocytes Absolute: 0.6 K/uL (ref 0.1–1.0)
Monocytes Relative: 9.7 % (ref 3.0–12.0)
Neutro Abs: 3.5 K/uL (ref 1.4–7.7)
Neutrophils Relative %: 57.2 % (ref 43.0–77.0)
Platelets: 153 K/uL (ref 150.0–400.0)
RBC: 5.03 Mil/uL (ref 3.87–5.11)
RDW: 14.8 % (ref 11.5–15.5)
WBC: 6.2 K/uL (ref 4.0–10.5)

## 2024-06-13 LAB — POCT I-STAT CREATININE: Creatinine, Ser: 0.8 mg/dL (ref 0.44–1.00)

## 2024-06-13 MED ORDER — IOHEXOL 300 MG/ML  SOLN
75.0000 mL | Freq: Once | INTRAMUSCULAR | Status: AC | PRN
  Administered 2024-06-13: 75 mL via INTRAVENOUS

## 2024-06-13 MED ORDER — DICLOFENAC SODIUM 75 MG PO TBEC: 75.0000 mg | DELAYED_RELEASE_TABLET | Freq: Two times a day (BID) | ORAL | 0 refills | Status: DC

## 2024-06-13 NOTE — Patient Instructions (Signed)
 Right lower abdominal pain, possible appendicitis Right lower abdominal pain with positive heel jar test and recent low-grade fever. Differential includes appendicitis and groin muscle pain. CT scan and labs prioritized. - Order CT of the abdomen and pelvis stat. - Order complete metabolic panel and cbc stat - Order urine analysis and urine culture. - Advise to refrain from eating until study results are available. - Instruct to drink only a little water. - Advise to go to the emergency department if pain worsens over the weekend.  Right hip pain, chronic Chronic right hip pain, distinct from current abdominal pain. Hip surgery planned. -refill diclofenac   Follow up date to be determined

## 2024-06-13 NOTE — Addendum Note (Signed)
 Addended by: DORINA DALLAS HERO on: 06/13/2024 09:14 PM   Modules accepted: Orders

## 2024-06-13 NOTE — Progress Notes (Addendum)
 Subjective:    Patient ID: Rhonda Morrow, female    DOB: 1961/03/24, 63 y.o.   MRN: [id number]  HPI  Rhonda Morrow is a 63 year old female with diabetes who presents with right-sided abdominal and hip pain.  She has been experiencing right-sided abdominal and hip pain, described as 'bad, bad, bad.' The pain is located in the abdomen and hip area, worsening when standing. It has been present since February, with a notable increase in intensity over the past week.  Pain in feb more from hip. Change of pain now more rlq.  The patient reports that the pain is different from her usual hip pain and describes it as being in the groin area. She experienced a low-grade fever two days ago, which subsided after drinking water. Her appetite remains mostly normal, although she sometimes does not want to eat much due to the pain.  She takes diclofenac  for pain management, using it in the morning and at night. She did not take her medication this morning due to rushing to the appointment and her mother's emergency room visit. She is diabetic, with a well-controlled A1c of 6.4, and she did not eat since yesterday midday.    Review of Systems  Constitutional:  Positive for fever. Negative for chills and fatigue.  Respiratory:  Negative for cough, chest tightness, shortness of breath and wheezing.   Cardiovascular:  Negative for chest pain and palpitations.  Gastrointestinal:  Positive for abdominal pain. Negative for abdominal distention, blood in stool, constipation, nausea and vomiting.  Genitourinary:  Negative for dysuria, flank pain and hematuria.  Musculoskeletal:  Negative for back pain, myalgias and neck stiffness.  Skin:  Negative for rash.  Neurological:  Negative for facial asymmetry and light-headedness.  Hematological:  Negative for adenopathy. Does not bruise/bleed easily.  Psychiatric/Behavioral:  Negative for behavioral problems and confusion. The patient is not nervous/anxious.     Past Medical History:  Diagnosis Date   Arthritis    Asthma    Complication of anesthesia    one time woke up and was vey scared,17 yrs ago   Deaf    Diabetes mellitus without complication (HCC)    GERD (gastroesophageal reflux disease)    Hypertension    Left groin pain    Nausea with vomiting 02/19/2014   Thyroid  disease      Social History   Socioeconomic History   Marital status: Married    Spouse name: Not on file   Number of children: Not on file   Years of education: Not on file   Highest education level: Not on file  Occupational History   Occupation: disabled  Tobacco Use   Smoking status: Never   Smokeless tobacco: Never  Vaping Use   Vaping status: Never Used  Substance and Sexual Activity   Alcohol use: No    Alcohol/week: 0.0 standard drinks of alcohol   Drug use: No   Sexual activity: Not Currently  Other Topics Concern   Not on file  Social History Narrative   Lives with family.  Has 3 children.  Works for [employer]  Education: high school.   Social Drivers of Corporate investment banker Strain: Not on file  Food Insecurity: Not on file  Transportation Needs: Not on file  Physical Activity: Not on file  Stress: Not on file  Social Connections: Unknown (03/18/2022)   [facility]   Social Network    Social Network: Not on file  Intimate  Partner Violence: Unknown (02/08/2022)   Received from [facility]   HITS    Physically Hurt: Not on file    Insult or Talk Down To: Not on file    Threaten Physical Harm: Not on file    Scream or Curse: Not on file    Past Surgical History:  Procedure Laterality Date   CARDIAC CATHETERIZATION     CESAREAN SECTION     CHOLECYSTECTOMY N/A 06/20/2016   Procedure: LAPAROSCOPIC CHOLECYSTECTOMY;  Surgeon: Donnice Bury, MD;  Location: [facility] OR;  Service: General;  Laterality: N/A;   ESOPHAGEAL MANOMETRY N/A 09/29/2013   Procedure: ESOPHAGEAL MANOMETRY (EM);  Surgeon: Toribio SHAUNNA Cedar, MD;   Location: [facility] ENDOSCOPY;  Service: Endoscopy;  Laterality: N/A;   TOTAL HIP ARTHROPLASTY Left 02/21/2017   Procedure: LEFT TOTAL HIP ARTHROPLASTY ANTERIOR APPROACH;  Surgeon: Kay CHRISTELLA Cummins, MD;  Location: [facility] OR;  Service: Orthopedics;  Laterality: Left;    Family History  Problem Relation Age of Onset   Diabetes Mother    Heart disease Mother    Diabetes Father    Colon cancer Neg Hx    Colon polyps Neg Hx    Esophageal cancer Neg Hx    Rectal cancer Neg Hx    Stomach cancer Neg Hx     Allergies  Allergen Reactions   Losartan  Shortness Of Breath   Oxycodone -Acetaminophen  Nausea And Vomiting   Augmentin  [Amoxicillin -Pot Clavulanate] Diarrhea    Current Outpatient Medications on File Prior to Visit  Medication Sig Dispense Refill   albuterol  (VENTOLIN  HFA) 108 (90 Base) MCG/ACT inhaler Inhale 1-2 puffs into the lungs every 6 (six) hours as needed for wheezing or shortness of breath. J45.40 1 each 3   allopurinol  (ZYLOPRIM ) 100 MG tablet Take 1 tablet (100 mg total) by mouth daily. 90 tablet 0   amLODipine  (NORVASC ) 5 MG tablet Take 1 tablet (5 mg total) by mouth daily. 90 tablet 0   atorvastatin  (LIPITOR) 20 MG tablet TAKE 1 TABLET BY MOUTH EVERY DAY 90 tablet 1   Azelastine  HCl 137 MCG/SPRAY SOLN PLACE 2 SPRAYS INTO BOTH NOSTRILS 2 (TWO) TIMES DAILY. USE IN EACH NOSTRIL AS DIRECTED (Patient taking differently: as needed.) 30 mL 3   blood glucose meter kit and supplies KIT Dispense based on patient and insurance preference. Use up to four times daily as directed. (FOR ICD-9 250.00, 250.01). 1 each 0   cetirizine  (ZYRTEC ) 10 MG tablet TAKE 1 TABLET BY MOUTH EVERY DAY 90 tablet 2   fluticasone  (FLONASE ) 50 MCG/ACT nasal spray Place 2 sprays into both nostrils daily. 16 g 6   fluticasone  (FLONASE ) 50 MCG/ACT nasal spray SPRAY 2 SPRAYS INTO EACH NOSTRIL EVERY DAY 16 mL 6   Fluticasone -Umeclidin-Vilant (TRELEGY ELLIPTA ) 100-62.5-25 MCG/ACT AEPB Inhale 1 puff into the lungs daily. 1 each 5    lansoprazole (PREVACID) 30 MG capsule Take 1 capsule (30 mg total) by mouth daily at 12 noon. 90 capsule 1   LORazepam  (ATIVAN ) 0.5 MG tablet 1-2 tabs 30 - 60 min prior to MRI. Do not drive with this medicine. 4 tablet 0   meclizine  (ANTIVERT ) 25 MG tablet Take 1 tablet (25 mg total) by mouth 3 (three) times daily as needed for dizziness. 20 tablet 0   metFORMIN  (GLUCOPHAGE -XR) 500 MG 24 hr tablet TAKE 2 TABLETS BY MOUTH EVERY DAY WITH BREAKFAST 180 tablet 1   potassium chloride  (KLOR-CON ) 10 MEQ tablet Take 1 tablet (10 mEq total) by mouth 2 (two) times daily. 60 tablet 2  potassium chloride  SA (KLOR-CON  M) 20 MEQ tablet Take 1 tablet (20 mEq total) by mouth 2 (two) times daily. 6 tablet 0   Semaglutide  (RYBELSUS ) 7 MG TABS Take 1 tablet (7 mg total) by mouth daily. 90 tablet 1   solifenacin  (VESICARE ) 5 MG tablet TAKE 1 TABLET (5 MG TOTAL) BY MOUTH DAILY. 90 tablet 1   terconazole  (TERAZOL 7 ) 0.4 % vaginal cream PLACE 1 APPLICATOR VAGINALLY AT BEDTIME. 45 g 0   traMADol  (ULTRAM ) 50 MG tablet Take 1 tablet (50 mg total) by mouth every 12 (twelve) hours as needed for severe pain (pain score 7-10). 60 tablet 0   Current Facility-Administered Medications on File Prior to Visit  Medication Dose Route Frequency Provider Last Rate Last Admin   alum & mag hydroxide-simeth (MAALOX/MYLANTA) 200-200-20 MG/5ML suspension 30 mL  30 mL Oral Once Katriona Schmierer, PA-C       lidocaine  (XYLOCAINE ) 2 % viscous mouth solution 15 mL  15 mL Mouth/Throat Once Naziah Weckerly, PA-C       nitroGLYCERIN  (NITRODUR - Dosed in mg/24 hr) patch 0.2 mg  0.2 mg Transdermal Daily Regal, [place] RAMAN, DPM        BP (!) 140/80   Pulse (!) 50   Temp 97.7 F (36.5 C) (Oral)   Resp 15   Ht 5' 4 (1.626 m)   Wt 210 lb 3.2 oz (95.3 kg)   LMP 05/20/2012   SpO2 97%   BMI 36.08 kg/m        Objective:   Physical Exam  General Mental Status- Alert. General Appearance- Not in acute distress.   Skin General: Color- Normal  Color. Moisture- Normal Moisture.  Neck Carotid Arteries- Normal color. Moisture- Normal Moisture. No carotid bruits. No JVD.  Chest and Lung Exam Auscultation: Breath Sounds:-CTA  Cardiovascular Auscultation:Rythm- RRR Murmurs & Other Heart Sounds:Auscultation of the heart reveals- No Murmurs.  Abdomen Inspection:-Inspeection Normal. Palpation/Percussion:Note:No mass. Palpation and Percussion of the abdomen reveal- moderate rlq Tender, Non Distended + BS, no rebound or guarding. + heel jar test. Mild pain on rotating rt lower ext.   Neurologic Cranial Nerve exam:- CN III-XII intact(No nystagmus), symmetric smile. Strength:- 5/5 equal and symmetric strength both upper and lower extremities.       Assessment & Plan:   Patient Instructions  Right lower abdominal pain, possible appendicitis Right lower abdominal pain with positive heel jar test and recent low-grade fever. Differential includes appendicitis and groin muscle pain. CT scan and labs prioritized. - Order CT of the abdomen and pelvis stat. - Order complete metabolic panel and cbc stat - Order urine analysis and urine culture. - Advise to refrain from eating until study results are available. - Instruct to drink only a little water. - Advise to go to the emergency department if pain worsens over the weekend.  Right hip pain, chronic Chronic right hip pain, distinct from current abdominal pain. Hip surgery planned. -refill diclofenac   Follow up date to be determined   Time spent with patient today was 43  minutes which consisted of chart review, discussing diagnosis, work up treatment and documentation. Exam conducted with sign language interpretor.

## 2024-06-14 LAB — URINE CULTURE
MICRO NUMBER:: 16806277
Result:: NO GROWTH
SPECIMEN QUALITY:: ADEQUATE

## 2024-06-24 ENCOUNTER — Ambulatory Visit (INDEPENDENT_AMBULATORY_CARE_PROVIDER_SITE_OTHER): Admitting: Family Medicine

## 2024-06-24 ENCOUNTER — Encounter: Payer: Self-pay | Admitting: Family Medicine

## 2024-06-24 VITALS — BP 130/84 | HR 45 | Resp 18 | Ht 64.0 in | Wt 209.6 lb

## 2024-06-24 DIAGNOSIS — M25551 Pain in right hip: Secondary | ICD-10-CM

## 2024-06-24 DIAGNOSIS — E876 Hypokalemia: Secondary | ICD-10-CM

## 2024-06-24 NOTE — Progress Notes (Signed)
 Subjective:    Patient ID: Rhonda Morrow, female    DOB: 06-06-1961, 63 y.o.   MRN: [id number]  Chief Complaint  Patient presents with   Follow-up    HPI Patient is in today for R hip pain.   Discussed the use of AI scribe software for clinical note transcription with the patient, who gave verbal consent to proceed.  History of Present Illness Rhonda Morrow is a 63 year old female who presents with abdominal pain and follow-up on potassium levels.  She has been experiencing persistent abdominal pain located in the same area as her hip, which requires replacement. The pain impacts her daily activities and was previously evaluated with a CT scan with contrast, which showed a normal appendix.  She uses a cane for mobility due to hip pain, affecting her ability to cook, and Aida assists with meals.  She has low potassium levels noted in a previous test and is currently taking potassium supplements twice daily. She expresses confusion about her medication regimen but takes two pills in the morning to simplify her routine.  She reports bruising and pink discoloration at the site of the CT contrast injection.  Her blood pressure was recorded at 146/80, and she does not have a blood pressure cuff at home. She has recently received a new scale from [employer].    Past Medical History:  Diagnosis Date   Arthritis    Asthma    Complication of anesthesia    one time woke up and was vey scared,17 yrs ago   Deaf    Diabetes mellitus without complication (HCC)    GERD (gastroesophageal reflux disease)    Hypertension    Left groin pain    Nausea with vomiting 02/19/2014   Thyroid  disease     Past Surgical History:  Procedure Laterality Date   CARDIAC CATHETERIZATION     CESAREAN SECTION     CHOLECYSTECTOMY N/A 06/20/2016   Procedure: LAPAROSCOPIC CHOLECYSTECTOMY;  Surgeon: Donnice Bury, MD;  Location: [facility] OR;  Service: General;  Laterality: N/A;    ESOPHAGEAL MANOMETRY N/A 09/29/2013   Procedure: ESOPHAGEAL MANOMETRY (EM);  Surgeon: Toribio SHAUNNA Cedar, MD;  Location: [facility] ENDOSCOPY;  Service: Endoscopy;  Laterality: N/A;   TOTAL HIP ARTHROPLASTY Left 02/21/2017   Procedure: LEFT TOTAL HIP ARTHROPLASTY ANTERIOR APPROACH;  Surgeon: Kay CHRISTELLA Cummins, MD;  Location: [facility] OR;  Service: Orthopedics;  Laterality: Left;    Family History  Problem Relation Age of Onset   Diabetes Mother    Heart disease Mother    Diabetes Father    Colon cancer Neg Hx    Colon polyps Neg Hx    Esophageal cancer Neg Hx    Rectal cancer Neg Hx    Stomach cancer Neg Hx     Social History   Socioeconomic History   Marital status: Married    Spouse name: Not on file   Number of children: Not on file   Years of education: Not on file   Highest education level: Not on file  Occupational History   Occupation: disabled  Tobacco Use   Smoking status: Never   Smokeless tobacco: Never  Vaping Use   Vaping status: Never Used  Substance and Sexual Activity   Alcohol use: No    Alcohol/week: 0.0 standard drinks of alcohol   Drug use: No   Sexual activity: Not Currently  Other Topics Concern   Not  on file  Social History Narrative   Lives with family.  Has 3 children.  Works for [employer]  Education: high school.   Social Drivers of Corporate investment banker Strain: Not on file  Food Insecurity: Not on file  Transportation Needs: Not on file  Physical Activity: Not on file  Stress: Not on file  Social Connections: Unknown (03/18/2022)   Received from [facility]   Social Network    Social Network: Not on file  Intimate Partner Violence: Unknown (02/08/2022)   Received from [facility]   HITS    Physically Hurt: Not on file    Insult or Talk Down To: Not on file    Threaten Physical Harm: Not on file    Scream or Curse: Not on file    Outpatient Medications Prior to Visit  Medication Sig Dispense Refill   albuterol  (VENTOLIN  HFA) 108 (90 Base)  MCG/ACT inhaler Inhale 1-2 puffs into the lungs every 6 (six) hours as needed for wheezing or shortness of breath. J45.40 1 each 3   allopurinol  (ZYLOPRIM ) 100 MG tablet Take 1 tablet (100 mg total) by mouth daily. 90 tablet 0   amLODipine  (NORVASC ) 5 MG tablet Take 1 tablet (5 mg total) by mouth daily. 90 tablet 0   atorvastatin  (LIPITOR) 20 MG tablet TAKE 1 TABLET BY MOUTH EVERY DAY 90 tablet 1   Azelastine  HCl 137 MCG/SPRAY SOLN PLACE 2 SPRAYS INTO BOTH NOSTRILS 2 (TWO) TIMES DAILY. USE IN EACH NOSTRIL AS DIRECTED (Patient taking differently: as needed.) 30 mL 3   blood glucose meter kit and supplies KIT Dispense based on patient and insurance preference. Use up to four times daily as directed. (FOR ICD-9 250.00, 250.01). 1 each 0   cetirizine  (ZYRTEC ) 10 MG tablet TAKE 1 TABLET BY MOUTH EVERY DAY 90 tablet 2   diclofenac  (VOLTAREN ) 75 MG EC tablet Take 1 tablet (75 mg total) by mouth 2 (two) times daily. 60 tablet 0   fluticasone  (FLONASE ) 50 MCG/ACT nasal spray Place 2 sprays into both nostrils daily. 16 g 6   fluticasone  (FLONASE ) 50 MCG/ACT nasal spray SPRAY 2 SPRAYS INTO EACH NOSTRIL EVERY DAY 16 mL 6   Fluticasone -Umeclidin-Vilant (TRELEGY ELLIPTA ) 100-62.5-25 MCG/ACT AEPB Inhale 1 puff into the lungs daily. 1 each 5   lansoprazole (PREVACID) 30 MG capsule Take 1 capsule (30 mg total) by mouth daily at 12 noon. 90 capsule 1   LORazepam  (ATIVAN ) 0.5 MG tablet 1-2 tabs 30 - 60 min prior to MRI. Do not drive with this medicine. 4 tablet 0   meclizine  (ANTIVERT ) 25 MG tablet Take 1 tablet (25 mg total) by mouth 3 (three) times daily as needed for dizziness. 20 tablet 0   metFORMIN  (GLUCOPHAGE -XR) 500 MG 24 hr tablet TAKE 2 TABLETS BY MOUTH EVERY DAY WITH BREAKFAST 180 tablet 1   potassium chloride  (KLOR-CON ) 10 MEQ tablet Take 1 tablet (10 mEq total) by mouth 2 (two) times daily. 60 tablet 2   potassium chloride  SA (KLOR-CON  M) 20 MEQ tablet Take 1 tablet (20 mEq total) by mouth 2 (two) times  daily. 6 tablet 0   Semaglutide  (RYBELSUS ) 7 MG TABS Take 1 tablet (7 mg total) by mouth daily. 90 tablet 1   solifenacin  (VESICARE ) 5 MG tablet TAKE 1 TABLET (5 MG TOTAL) BY MOUTH DAILY. 90 tablet 1   terconazole  (TERAZOL 7 ) 0.4 % vaginal cream PLACE 1 APPLICATOR VAGINALLY AT BEDTIME. 45 g 0   traMADol  (ULTRAM ) 50 MG tablet  Take 1 tablet (50 mg total) by mouth every 12 (twelve) hours as needed for severe pain (pain score 7-10). 60 tablet 0   Facility-Administered Medications Prior to Visit  Medication Dose Route Frequency Provider Last Rate Last Admin   alum & mag hydroxide-simeth (MAALOX/MYLANTA) 200-200-20 MG/5ML suspension 30 mL  30 mL Oral Once Saguier, Edward, PA-C       lidocaine  (XYLOCAINE ) 2 % viscous mouth solution 15 mL  15 mL Mouth/Throat Once Saguier, Edward, PA-C       nitroGLYCERIN  (NITRODUR - Dosed in mg/24 hr) patch 0.2 mg  0.2 mg Transdermal Daily Regal, [place] RAMAN, DPM        Allergies  Allergen Reactions   Losartan  Shortness Of Breath   Oxycodone -Acetaminophen  Nausea And Vomiting   Augmentin  [Amoxicillin -Pot Clavulanate] Diarrhea    Review of Systems  Constitutional:  Negative for fever and malaise/fatigue.  HENT:  Negative for congestion.   Eyes:  Negative for blurred vision.  Respiratory:  Negative for cough and shortness of breath.   Cardiovascular:  Negative for chest pain, palpitations and leg swelling.  Gastrointestinal:  Negative for vomiting.  Musculoskeletal:  Positive for joint pain. Negative for back pain.  Skin:  Negative for rash.  Neurological:  Negative for loss of consciousness and headaches.       Objective:    Physical Exam Vitals and nursing note reviewed.  Constitutional:      General: She is not in acute distress.    Appearance: Normal appearance. She is well-developed.  HENT:     Head: Normocephalic and atraumatic.  Eyes:     General: No scleral icterus.       Right eye: No discharge.        Left eye: No discharge.  Cardiovascular:      Rate and Rhythm: Normal rate and regular rhythm.     Heart sounds: No murmur heard. Pulmonary:     Effort: Pulmonary effort is normal. No respiratory distress.     Breath sounds: Normal breath sounds.  Musculoskeletal:        General: Tenderness present. Normal range of motion.     Cervical back: Normal range of motion and neck supple.     Right lower leg: No edema.     Left lower leg: No edema.  Skin:    General: Skin is warm and dry.  Neurological:     Mental Status: She is alert and oriented to person, place, and time.  Psychiatric:        Mood and Affect: Mood normal.        Behavior: Behavior normal.        Thought Content: Thought content normal.        Judgment: Judgment normal.     BP 130/84   Pulse (!) 45   Resp 18   Ht 5' 4 (1.626 m)   Wt 209 lb 9.6 oz (95.1 kg)   LMP 05/20/2012   SpO2 96%   BMI 35.98 kg/m  Wt Readings from Last 3 Encounters:  06/24/24 209 lb 9.6 oz (95.1 kg)  06/13/24 210 lb 3.2 oz (95.3 kg)  06/03/24 211 lb (95.7 kg)    Diabetic Foot Exam - Simple   No data filed    Lab Results  Component Value Date   WBC 6.2 06/13/2024   HGB 14.0 06/13/2024   HCT 42.6 06/13/2024   PLT 153.0 06/13/2024   GLUCOSE 127 (H) 06/13/2024   CHOL 149 04/01/2024   TRIG 88.0 04/01/2024  HDL 57.00 04/01/2024   LDLCALC 74 04/01/2024   ALT 14 06/13/2024   AST 15 06/13/2024   NA 142 06/13/2024   K 3.3 (L) 06/13/2024   CL 101 06/13/2024   CREATININE 0.80 06/13/2024   BUN 15 06/13/2024   CO2 32 06/13/2024   TSH 1.23 08/18/2022   INR 1.1 (H) 04/01/2024   HGBA1C 6.4 04/01/2024   MICROALBUR <0.7 05/14/2024    Lab Results  Component Value Date   TSH 1.23 08/18/2022   Lab Results  Component Value Date   WBC 6.2 06/13/2024   HGB 14.0 06/13/2024   HCT 42.6 06/13/2024   MCV 84.6 06/13/2024   PLT 153.0 06/13/2024   Lab Results  Component Value Date   NA 142 06/13/2024   K 3.3 (L) 06/13/2024   CO2 32 06/13/2024   GLUCOSE 127 (H) 06/13/2024    BUN 15 06/13/2024   CREATININE 0.80 06/13/2024   BILITOT 0.4 06/13/2024   ALKPHOS 90 06/13/2024   AST 15 06/13/2024   ALT 14 06/13/2024   PROT 6.9 06/13/2024   ALBUMIN 3.7 06/13/2024   CALCIUM  9.2 06/13/2024   ANIONGAP 10 03/26/2024   EGFR 60 04/11/2022   GFR 86.33 06/13/2024   Lab Results  Component Value Date   CHOL 149 04/01/2024   Lab Results  Component Value Date   HDL 57.00 04/01/2024   Lab Results  Component Value Date   LDLCALC 74 04/01/2024   Lab Results  Component Value Date   TRIG 88.0 04/01/2024   Lab Results  Component Value Date   CHOLHDL 3 04/01/2024   Lab Results  Component Value Date   HGBA1C 6.4 04/01/2024       Assessment & Plan:  Hypokalemia -     Comprehensive metabolic panel with GFR  Assessment and Plan Assessment & Plan Osteoarthritis of right hip, awaiting hip replacement   Chronic osteoarthritis in the right hip causes significant pain and functional limitation, affecting daily activities like cooking. She uses a cane for ambulation, and Aida assists with cooking due to her pain. Proceed with hip replacement surgery as planned and encourage continued use of the cane until surgery.  Hypokalemia   Previously identified hypokalemia requires ongoing potassium supplementation. She takes potassium twice daily, but levels need rechecking as the last check was low. Recheck potassium levels today at the lab and continue supplementation twice daily, with the option to take both doses in the morning.  Hypertension   Blood pressure is slightly elevated at 146/80 mmHg, possibly due to her busy schedule. She lacks a home blood pressure cuff for self-monitoring. Consider obtaining a cuff for home use.    Jarissa Sheriff R Lowne Chase, DO

## 2024-06-25 LAB — COMPREHENSIVE METABOLIC PANEL WITH GFR
ALT: 12 U/L (ref 0–35)
AST: 14 U/L (ref 0–37)
Albumin: 3.7 g/dL (ref 3.5–5.2)
Alkaline Phosphatase: 83 U/L (ref 39–117)
BUN: 20 mg/dL (ref 6–23)
CO2: 29 meq/L (ref 19–32)
Calcium: 9.2 mg/dL (ref 8.4–10.5)
Chloride: 102 meq/L (ref 96–112)
Creatinine, Ser: 1.13 mg/dL (ref 0.40–1.20)
GFR: 52.78 mL/min — ABNORMAL LOW (ref >60.00)
Glucose, Bld: 134 mg/dL — ABNORMAL HIGH (ref 70–99)
Potassium: 3.8 meq/L (ref 3.5–5.1)
Sodium: 139 meq/L (ref 135–145)
Total Bilirubin: 0.5 mg/dL (ref 0.2–1.2)
Total Protein: 7 g/dL (ref 6.0–8.3)

## 2024-06-26 ENCOUNTER — Encounter (HOSPITAL_COMMUNITY): Payer: Self-pay | Admitting: *Deleted

## 2024-06-26 ENCOUNTER — Telehealth (HOSPITAL_COMMUNITY): Payer: Self-pay | Admitting: *Deleted

## 2024-06-26 NOTE — Telephone Encounter (Signed)
 MPI instructions sent via [employer].

## 2024-06-27 ENCOUNTER — Other Ambulatory Visit: Payer: Self-pay | Admitting: Family Medicine

## 2024-06-27 DIAGNOSIS — E1151 Type 2 diabetes mellitus with diabetic peripheral angiopathy without gangrene: Secondary | ICD-10-CM

## 2024-06-27 DIAGNOSIS — E876 Hypokalemia: Secondary | ICD-10-CM

## 2024-07-02 ENCOUNTER — Other Ambulatory Visit: Payer: Self-pay | Admitting: Adult Health

## 2024-07-03 ENCOUNTER — Ambulatory Visit (HOSPITAL_BASED_OUTPATIENT_CLINIC_OR_DEPARTMENT_OTHER)
Admission: RE | Admit: 2024-07-03 | Discharge: 2024-07-03 | Disposition: A | Discharge status: Home / Self Care | Source: Ambulatory Visit | Attending: Cardiology | Admitting: Cardiology

## 2024-07-03 DIAGNOSIS — R0609 Other forms of dyspnea: Secondary | ICD-10-CM | POA: Insufficient documentation

## 2024-07-04 LAB — ECHOCARDIOGRAM COMPLETE
AR max vel: 1.85 cm2
AV Area VTI: 2.16 cm2
AV Area mean vel: 1.94 cm2
AV Mean grad: 3 mmHg
AV Peak grad: 5.4 mmHg
Ao pk vel: 1.16 m/s
Area-P 1/2: 4.88 cm2
Calc EF: 54.2 %
S' Lateral: 2.5 cm
Single Plane A2C EF: 47.5 %
Single Plane A4C EF: 54 %

## 2024-07-06 ENCOUNTER — Ambulatory Visit: Payer: Self-pay | Admitting: Family Medicine

## 2024-07-08 ENCOUNTER — Other Ambulatory Visit: Payer: Self-pay | Admitting: Cardiology

## 2024-07-08 DIAGNOSIS — R0609 Other forms of dyspnea: Secondary | ICD-10-CM

## 2024-07-10 ENCOUNTER — Ambulatory Visit: Payer: Self-pay | Admitting: Cardiology

## 2024-07-10 ENCOUNTER — Ambulatory Visit (HOSPITAL_COMMUNITY)
Admission: RE | Admit: 2024-07-10 | Discharge: 2024-07-10 | Disposition: A | Discharge status: Home / Self Care | Source: Ambulatory Visit | Attending: Cardiology | Admitting: Cardiology

## 2024-07-10 ENCOUNTER — Encounter (HOSPITAL_COMMUNITY)

## 2024-07-10 DIAGNOSIS — R0609 Other forms of dyspnea: Secondary | ICD-10-CM | POA: Diagnosis present

## 2024-07-10 LAB — MYOCARDIAL PERFUSION IMAGING
LV dias vol: 48 mL (ref 46–106)
LV sys vol: 17 mL (ref 3.8–5.2)
Nuc Stress EF: 65 %
Peak HR: 100 {beats}/min
Rest HR: 90 {beats}/min
Rest Nuclear Isotope Dose: 9.7 mCi
SDS: 1
SRS: 3
SSS: 2
ST Depression (mm): 0 mm
Stress Nuclear Isotope Dose: 30.2 mCi
TID: 0.79

## 2024-07-10 MED ORDER — REGADENOSON 0.4 MG/5ML IV SOLN
0.4000 mg | Freq: Once | INTRAVENOUS | Status: AC
  Administered 2024-07-10: 0.4 mg via INTRAVENOUS

## 2024-07-10 MED ORDER — REGADENOSON 0.4 MG/5ML IV SOLN
INTRAVENOUS | Status: AC
  Filled 2024-07-10: qty 5

## 2024-07-10 MED ORDER — TECHNETIUM TC 99M TETROFOSMIN IV KIT
30.2000 | PACK | Freq: Once | INTRAVENOUS | Status: AC | PRN
  Administered 2024-07-10: 30.2 via INTRAVENOUS

## 2024-07-10 MED ORDER — TECHNETIUM TC 99M TETROFOSMIN IV KIT
9.7000 | PACK | Freq: Once | INTRAVENOUS | Status: AC | PRN
  Administered 2024-07-10: 9.7 via INTRAVENOUS

## 2024-07-11 ENCOUNTER — Telehealth: Payer: Self-pay

## 2024-07-11 NOTE — Telephone Encounter (Signed)
 Left message on My Chart with stress test results per Dr. Tonja Fray note. Routed to PCP.

## 2024-07-11 NOTE — Telephone Encounter (Signed)
 Pt viewed Echo results on My Chart per Dr. Vanetta Shawl note. Routed to PCP.

## 2024-07-16 ENCOUNTER — Telehealth: Payer: Self-pay

## 2024-07-16 NOTE — Telephone Encounter (Signed)
 Pt viewed Stress test results on My Chart per Dr. Vanetta Shawl note. Routed to PCP.

## 2024-07-17 ENCOUNTER — Encounter: Payer: Self-pay | Admitting: Cardiology

## 2024-07-17 ENCOUNTER — Other Ambulatory Visit: Payer: Self-pay | Admitting: Family Medicine

## 2024-07-17 DIAGNOSIS — R1013 Epigastric pain: Secondary | ICD-10-CM

## 2024-07-18 ENCOUNTER — Other Ambulatory Visit: Payer: Self-pay | Admitting: Family Medicine

## 2024-07-18 ENCOUNTER — Ambulatory Visit: Payer: Self-pay

## 2024-07-18 ENCOUNTER — Telehealth: Payer: Self-pay | Admitting: Cardiology

## 2024-07-18 MED ORDER — ALBUTEROL SULFATE HFA 108 (90 BASE) MCG/ACT IN AERS: 1.0000 | INHALATION_SPRAY | Freq: Four times a day (QID) | RESPIRATORY_TRACT | 3 refills | Status: DC | PRN

## 2024-07-18 MED ORDER — MECLIZINE HCL 25 MG PO TABS: 25.0000 mg | ORAL_TABLET | Freq: Three times a day (TID) | ORAL | 0 refills | Status: DC | PRN

## 2024-07-18 NOTE — Telephone Encounter (Signed)
 Pt is calling to go over recent test results. Results were sent to PCP but they did not discuss with her. Please advise.

## 2024-07-18 NOTE — Telephone Encounter (Signed)
 FYI Only or Action Required?: Action required by provider: medication refill request.  Patient was last seen in primary care on 06/24/2024 by Antonio Meth, Rhonda SAUNDERS, DO.  Called Nurse Triage reporting Dizziness.  Symptoms began several days ago.  Interventions attempted: Prescription medications: Meclizine .  Symptoms are: gradually worsening.  Triage Disposition: See PCP When Office is Open (Within 3 Days)  Patient/caregiver understands and will follow disposition?: Yes     Copied from CRM #[id number]. Topic: Clinical - Red Word Triage >> Jul 18, 2024 10:45 AM Rhonda Morrow wrote: Patient having dizziness and wants a refill on virtigo medication but doesn't remember the name of the medication    Reason for Disposition  [1] MODERATE dizziness (e.g., vertigo; feels very unsteady, interferes with normal activities) AND [2] has been evaluated by doctor (or NP/PA) for this  Answer Assessment - Initial Assessment Questions Was on medication for vertigo. Patient states she feels whirley right now. It started a few days ago. Happens when standing. Unable to find the medication for symptoms. Feels like a cloud coming over here. Patient denies earache, numbness, tinnitus, vomiting, weakness. Patient having some shortness of breath and was grabbing inhaler during call. Patient stated her inhaler was empty during call. Patient requesting a refill for Albuterol  sulfate and meclizine . Patient also has had some diarrhea during call. Sign language interpreter used during call ID: [id number] [employer] .     1. CAUSE: What do you think is causing the dizziness?     Vertigo  2. RECURRENT SYMPTOM: Have you had dizziness before? If Yes, ask: When was the last time? What happened that time?     Yes, is on medication for vertigo.  3. OTHER SYMPTOMS: Do you have any other symptoms? (e.g., earache, headache, numbness, tinnitus, vomiting, weakness)     Headache  Protocols used:  Dizziness - Vertigo-A-AH

## 2024-07-18 NOTE — Telephone Encounter (Signed)
 Appt scheduled

## 2024-07-21 ENCOUNTER — Ambulatory Visit: Payer: Self-pay | Admitting: Cardiology

## 2024-07-21 ENCOUNTER — Ambulatory Visit (INDEPENDENT_AMBULATORY_CARE_PROVIDER_SITE_OTHER): Admitting: Medical

## 2024-07-21 ENCOUNTER — Telehealth: Payer: Self-pay | Admitting: Family Medicine

## 2024-07-21 ENCOUNTER — Ambulatory Visit: Payer: Self-pay | Admitting: Medical

## 2024-07-21 VITALS — BP 120/80 | HR 60 | Temp 97.7°F | Resp 17 | Ht 64.0 in

## 2024-07-21 DIAGNOSIS — R42 Dizziness and giddiness: Secondary | ICD-10-CM | POA: Diagnosis not present

## 2024-07-21 DIAGNOSIS — E1165 Type 2 diabetes mellitus with hyperglycemia: Secondary | ICD-10-CM | POA: Diagnosis not present

## 2024-07-21 DIAGNOSIS — Z7984 Long term (current) use of oral hypoglycemic drugs: Secondary | ICD-10-CM

## 2024-07-21 LAB — CBC WITH DIFFERENTIAL/PLATELET
Basophils Absolute: 0 K/uL (ref 0.0–0.1)
Basophils Relative: 0.8 % (ref 0.0–3.0)
Eosinophils Absolute: 0.2 K/uL (ref 0.0–0.7)
Eosinophils Relative: 4.4 % (ref 0.0–5.0)
HCT: 46.9 % — ABNORMAL HIGH (ref 36.0–46.0)
Hemoglobin: 15.1 g/dL — ABNORMAL HIGH (ref 12.0–15.0)
Lymphocytes Relative: 29.9 % (ref 12.0–46.0)
Lymphs Abs: 1.6 K/uL (ref 0.7–4.0)
MCHC: 32.1 g/dL (ref 30.0–36.0)
MCV: 85.4 fl (ref 78.0–100.0)
Monocytes Absolute: 0.5 K/uL (ref 0.1–1.0)
Monocytes Relative: 9.4 % (ref 3.0–12.0)
Neutro Abs: 2.9 K/uL (ref 1.4–7.7)
Neutrophils Relative %: 55.5 % (ref 43.0–77.0)
Platelets: 180 K/uL (ref 150.0–400.0)
RBC: 5.49 Mil/uL — ABNORMAL HIGH (ref 3.87–5.11)
RDW: 14.2 % (ref 11.5–15.5)
WBC: 5.3 K/uL (ref 4.0–10.5)

## 2024-07-21 LAB — HEMOGLOBIN A1C: Hgb A1c MFr Bld: 6.9 % — ABNORMAL HIGH (ref 4.6–6.5)

## 2024-07-21 MED ORDER — MECLIZINE HCL 25 MG PO TABS: 25.0000 mg | ORAL_TABLET | Freq: Three times a day (TID) | ORAL | 0 refills | Status: AC | PRN

## 2024-07-21 NOTE — Telephone Encounter (Signed)
 Copied from CRM #[id number]. Topic: Appointments - Appointment Info/Confirmation >> Jul 18, 2024  4:21 PM Terri MATSU wrote: Harlene from [facility], stated they don't have a in person interpreter for patients appointment Monday. Did advised the clinic needs to contact patient to let them know they will be using a machine for her appointment.

## 2024-07-21 NOTE — Progress Notes (Addendum)
 Subjective:    Patient ID: Rhonda Morrow, female    DOB: 1960/11/09, 63 y.o.   MRN: [id number]  HPI  Communication done through sign language interpretor.  Rhonda Morrow is a 63 year old female with vertigo who presents with dizziness and lightheadedness.  She has been experiencing dizziness and lightheadedness since last Wednesday, described as vertigo with a sensation of turning to one side, particularly when moving her head or rolling over in bed. The vertigo worsens with head movements, especially when turning to her left and when bending forward. Although the symptoms have improved, they persist throughout the day and night.  She has a history of vertigo and recalls that an MRI was performed in May. She recalls a past episode where she was in the ER and was informed of a small issue in the back of her head. She has been prescribed meclizine  in the past for vertigo, which she has taken occasionally.  No headaches, nausea, vomiting, or significant vision changes with glasses. She reports feeling hungry at night but does not associate this with her dizziness. She is diabetic, with a recent A1c of 6.4, and her average blood sugar was about 135 three months ago. She is currently taking metformin , two tablets daily, and Rybelsus  for diabetes management.  She mentions experiencing stress related to her mother's situation, which she feels contributes to her overall condition.    Review of Systems  Constitutional:  Negative for chills, fatigue and fever.  Respiratory:  Negative for chest tightness, shortness of breath and wheezing.   Cardiovascular:  Negative for chest pain and palpitations.  Gastrointestinal:  Negative for abdominal pain and constipation.  Neurological:  Negative for dizziness, syncope, weakness and numbness.       Vertigo.   Hematological:  Negative for adenopathy.  Psychiatric/Behavioral:  Positive for dysphoric mood. Negative for behavioral problems, decreased  concentration and suicidal ideas. The patient is nervous/anxious.     Past Medical History:  Diagnosis Date   Arthritis    Asthma    Complication of anesthesia    one time woke up and was vey scared,17 yrs ago   Deaf    Diabetes mellitus without complication (HCC)    GERD (gastroesophageal reflux disease)    Hypertension    Left groin pain    Nausea with vomiting 02/19/2014   Thyroid  disease      Social History   Socioeconomic History   Marital status: Married    Spouse name: Not on file   Number of children: Not on file   Years of education: Not on file   Highest education level: Not on file  Occupational History   Occupation: disabled  Tobacco Use   Smoking status: Never   Smokeless tobacco: Never  Vaping Use   Vaping status: Never Used  Substance and Sexual Activity   Alcohol use: No    Alcohol/week: 0.0 standard drinks of alcohol   Drug use: No   Sexual activity: Not Currently  Other Topics Concern   Not on file  Social History Narrative   Lives with family.  Has 3 children.  Works for [employer]  Education: high school.   Social Drivers of Corporate investment banker Strain: Not on file  Food Insecurity: Not on file  Transportation Needs: Not on file  Physical Activity: Not on file  Stress: Not on file  Social Connections: Unknown (03/18/2022)   Received from [facility]   Social Network    Social  Network: Not on file  Intimate Partner Violence: Unknown (02/08/2022)   Received from [facility]   HITS    Physically Hurt: Not on file    Insult or Talk Down To: Not on file    Threaten Physical Harm: Not on file    Scream or Curse: Not on file    Past Surgical History:  Procedure Laterality Date   CARDIAC CATHETERIZATION     CESAREAN SECTION     CHOLECYSTECTOMY N/A 06/20/2016   Procedure: LAPAROSCOPIC CHOLECYSTECTOMY;  Surgeon: Donnice Bury, MD;  Location: [facility] OR;  Service: General;  Laterality: N/A;   ESOPHAGEAL MANOMETRY N/A 09/29/2013    Procedure: ESOPHAGEAL MANOMETRY (EM);  Surgeon: Toribio SHAUNNA Cedar, MD;  Location: [facility];  Service: Endoscopy;  Laterality: N/A;   TOTAL HIP ARTHROPLASTY Left 02/21/2017   Procedure: LEFT TOTAL HIP ARTHROPLASTY ANTERIOR APPROACH;  Surgeon: Kay CHRISTELLA Cummins, MD;  Location: [facility] OR;  Service: Orthopedics;  Laterality: Left;    Family History  Problem Relation Age of Onset   Diabetes Mother    Heart disease Mother    Diabetes Father    Colon cancer Neg Hx    Colon polyps Neg Hx    Esophageal cancer Neg Hx    Rectal cancer Neg Hx    Stomach cancer Neg Hx     Allergies  Allergen Reactions   Losartan  Shortness Of Breath   Oxycodone -Acetaminophen  Nausea And Vomiting   Augmentin  [Amoxicillin -Pot Clavulanate] Diarrhea    Current Outpatient Medications on File Prior to Visit  Medication Sig Dispense Refill   albuterol  (VENTOLIN  HFA) 108 (90 Base) MCG/ACT inhaler Inhale 1-2 puffs into the lungs every 6 (six) hours as needed for wheezing or shortness of breath. 18 each 3   allopurinol  (ZYLOPRIM ) 100 MG tablet Take 1 tablet (100 mg total) by mouth daily. 90 tablet 0   amLODipine  (NORVASC ) 5 MG tablet Take 1 tablet (5 mg total) by mouth daily. 90 tablet 0   atorvastatin  (LIPITOR) 20 MG tablet TAKE 1 TABLET BY MOUTH EVERY DAY 90 tablet 1   Azelastine  HCl 137 MCG/SPRAY SOLN PLACE 2 SPRAYS INTO BOTH NOSTRILS 2 (TWO) TIMES DAILY. USE IN EACH NOSTRIL AS DIRECTED (Patient taking differently: as needed.) 30 mL 3   blood glucose meter kit and supplies KIT Dispense based on patient and insurance preference. Use up to four times daily as directed. (FOR ICD-9 250.00, 250.01). 1 each 0   cetirizine  (ZYRTEC ) 10 MG tablet TAKE 1 TABLET BY MOUTH EVERY DAY 90 tablet 2   diclofenac  (VOLTAREN ) 75 MG EC tablet Take 1 tablet (75 mg total) by mouth 2 (two) times daily. 60 tablet 0   fluticasone  (FLONASE ) 50 MCG/ACT nasal spray Place 2 sprays into both nostrils daily. 16 g 6   fluticasone  (FLONASE ) 50 MCG/ACT nasal  spray SPRAY 2 SPRAYS INTO EACH NOSTRIL EVERY DAY 16 mL 6   Fluticasone -Umeclidin-Vilant (TRELEGY ELLIPTA ) 100-62.5-25 MCG/ACT AEPB Inhale 1 puff into the lungs daily. 1 each 5   lansoprazole (PREVACID) 30 MG capsule TAKE 1 CAPSULE (30 MG TOTAL) BY MOUTH DAILY AT 12 NOON. 90 capsule 1   LORazepam  (ATIVAN ) 0.5 MG tablet 1-2 tabs 30 - 60 min prior to MRI. Do not drive with this medicine. 4 tablet 0   metFORMIN  (GLUCOPHAGE -XR) 500 MG 24 hr tablet Take 2 tablets (1,000 mg total) by mouth daily with breakfast. 180 tablet 1   Na Sulfate-K Sulfate-Mg Sulfate concentrate (SUPREP) 17.5-3.13-1.6 GM/177ML SOLN Take 1 kit by mouth See admin instructions.  potassium chloride  (KLOR-CON ) 10 MEQ tablet Take 1 tablet (10 mEq total) by mouth 2 (two) times daily. 180 tablet 1   Semaglutide  (RYBELSUS ) 7 MG TABS Take 1 tablet (7 mg total) by mouth daily. 90 tablet 1   solifenacin  (VESICARE ) 5 MG tablet TAKE 1 TABLET (5 MG TOTAL) BY MOUTH DAILY. 90 tablet 1   terconazole  (TERAZOL 7 ) 0.4 % vaginal cream PLACE 1 APPLICATOR VAGINALLY AT BEDTIME. 45 g 0   traMADol  (ULTRAM ) 50 MG tablet Take 1 tablet (50 mg total) by mouth every 12 (twelve) hours as needed for severe pain (pain score 7-10). 60 tablet 0   Current Facility-Administered Medications on File Prior to Visit  Medication Dose Route Frequency Provider Last Rate Last Admin   alum & mag hydroxide-simeth (MAALOX/MYLANTA) 200-200-20 MG/5ML suspension 30 mL  30 mL Oral Once Alajiah Dutkiewicz, PA-C       lidocaine  (XYLOCAINE ) 2 % viscous mouth solution 15 mL  15 mL Mouth/Throat Once Shaquina Gillham, PA-C       nitroGLYCERIN  (NITRODUR - Dosed in mg/24 hr) patch 0.2 mg  0.2 mg Transdermal Daily Regal, [place] RAMAN, DPM        BP 120/80   Pulse 60   Temp 97.7 F (36.5 C) (Oral)   Resp 17   Ht 5' 4 (1.626 m)   LMP 05/20/2012   SpO2 99%   BMI 35.98 kg/m        Objective:   Physical Exam General Mental Status- Alert. General Appearance- Not in acute distress.    Skin General: Color- Normal Color. Moisture- Normal Moisture.  Neck . No JVD.  Chest and Lung Exam Auscultation: Breath Sounds:-Normal.  Cardiovascular Auscultation:Rythm- Regular. Murmurs & Other Heart Sounds:Auscultation of the heart reveals- No Murmurs.  Abdomen Inspection:-Inspeection Normal. Palpation/Percussion:Note:No mass. Palpation and Percussion of the abdomen reveal- Non Tender, Non Distended + BS, no rebound or guarding.   Neurologic Cranial Nerve exam:- CN III-XII intact(No nystagmus), symmetric smile. Drift Test:- No drift. Romberg Exam:- Negative.  Heal to Toe Gait exam:-Normal. Finger to Nose:- Normal/Intact Strength:- 5/5 equal and symmetric strength both upper and lower extremities.  No vertigo on head position changes. Though brief when going from supine to sitting.       Assessment & Plan:   Patient Instructions  Benign paroxysmal vertigo Vertigo symptoms persist with head movements, likely due to inner ear stones. Neurologic exam reassuring. No imaging needed. - Refer to physical therapy for Epley maneuvers. - Order CBC and metabolic panel. - Prescribe meclizine , one tablet every eight hours for next 24 hours. Then every 8 hours as needed - Advise to monitor symptoms and seek emergency care if symptoms worsen or new symptoms occur.  Type 2 diabetes mellitus without complications Diabetes well-controlled with A1c of 6.4. Current symptoms consistent with vertigo, not blood sugar fluctuations. - Order A1c test. - Continue metformin  and Rybelsus .  Depression PHQ-9 score of 8 indicates moderate depression, possibly exacerbated by vertigo and stress. - Monitor mood and consider low-dose medication or counseling referral if symptoms persist or worsen.  Anxiety disorder GAD-7 score of 5 indicates mild anxiety, possibly influenced by physical symptoms and stressors. - Monitor anxiety levels and consider treatment options if symptoms persist or  worsen.  Follow up date to be determined after lab and after PT.    Aundraya Dripps, PA-C    I personally spent a total of 45 minutes in the care of the patient today including performing a medically appropriate exam/evaluation, counseling and educating, and  documenting clinical information in the EHR.

## 2024-07-21 NOTE — Telephone Encounter (Signed)
 noted

## 2024-07-21 NOTE — Patient Instructions (Addendum)
 Benign paroxysmal vertigo Vertigo symptoms persist with head movements, likely due to inner ear stones. Neurologic exam reassuring. No imaging needed. - Refer to physical therapy for Epley maneuvers. - Order CBC and metabolic panel. - Prescribe meclizine , one tablet every eight hours for next 24 hours. Then every 8 hours as needed - Advise to monitor symptoms and seek emergency care if symptoms worsen or new symptoms occur.  Type 2 diabetes mellitus without complications Diabetes well-controlled with A1c of 6.4. Current symptoms consistent with vertigo, not blood sugar fluctuations. - Order A1c test. - Continue metformin  and Rybelsus .  Depression PHQ-9 score of 8 indicates moderate depression, possibly exacerbated by vertigo and stress. - Monitor mood and consider low-dose medication or counseling referral if symptoms persist or worsen.  Anxiety disorder GAD-7 score of 5 indicates mild anxiety, possibly influenced by physical symptoms and stressors. - Monitor anxiety levels and consider treatment options if symptoms persist or worsen.  Follow up date to be determined after lab and after PT.  For insomnia can try melatonin 3 mg at night.

## 2024-07-22 ENCOUNTER — Telehealth: Payer: Self-pay

## 2024-07-22 DIAGNOSIS — Z124 Encounter for screening for malignant neoplasm of cervix: Secondary | ICD-10-CM | POA: Diagnosis not present

## 2024-07-22 DIAGNOSIS — Z13820 Encounter for screening for osteoporosis: Secondary | ICD-10-CM | POA: Diagnosis not present

## 2024-07-22 DIAGNOSIS — Z1231 Encounter for screening mammogram for malignant neoplasm of breast: Secondary | ICD-10-CM | POA: Diagnosis not present

## 2024-07-22 DIAGNOSIS — Z01419 Encounter for gynecological examination (general) (routine) without abnormal findings: Secondary | ICD-10-CM | POA: Diagnosis not present

## 2024-07-22 DIAGNOSIS — Z1151 Encounter for screening for human papillomavirus (HPV): Secondary | ICD-10-CM | POA: Diagnosis not present

## 2024-07-22 LAB — COMPLETE METABOLIC PANEL WITHOUT GFR
AG Ratio: 1.3 (calc) (ref 1.0–2.5)
ALT: 13 U/L (ref 6–29)
AST: 13 U/L (ref 10–35)
Albumin: 3.9 g/dL (ref 3.6–5.1)
Alkaline phosphatase (APISO): 88 U/L (ref 37–153)
BUN: 14 mg/dL (ref 7–25)
CO2: 28 mmol/L (ref 20–32)
Calcium: 9.6 mg/dL (ref 8.6–10.4)
Chloride: 103 mmol/L (ref 98–110)
Creat: 0.92 mg/dL (ref 0.50–1.05)
Globulin: 3 g/dL (ref 1.9–3.7)
Glucose, Bld: 138 mg/dL — ABNORMAL HIGH (ref 65–99)
Potassium: 3.8 mmol/L (ref 3.5–5.3)
Sodium: 141 mmol/L (ref 135–146)
Total Bilirubin: 0.5 mg/dL (ref 0.2–1.2)
Total Protein: 6.9 g/dL (ref 6.1–8.1)

## 2024-07-22 MED ORDER — RYBELSUS 7 MG PO TABS: 7.0000 mg | ORAL_TABLET | Freq: Every day | ORAL | 1 refills | Status: AC

## 2024-07-22 NOTE — Addendum Note (Signed)
 Addended by: DORINA DALLAS HERO on: 07/22/2024 05:20 PM   Modules accepted: Orders

## 2024-07-22 NOTE — Telephone Encounter (Signed)
   Pre-operative Risk Assessment    Patient Name: Rhonda Morrow  DOB: [birth year] MRN: [id number]   Date of last office visit: 06/03/24 Date of next office visit: 08/21/24   Request for Surgical Clearance    Procedure:  Right Total Hip  Date of Surgery:  Clearance TBD                                Surgeon:  Dr. Kay Ozell Cummins Surgeon's Group or Practice Name:  [facility] Phone number:  [phone] Fax number:  [phone]   Type of Clearance Requested:   - Medical    Type of Anesthesia:  Spinal   Additional requests/questions:    [name]   07/22/2024, 2:34 PM

## 2024-07-23 ENCOUNTER — Other Ambulatory Visit (HOSPITAL_COMMUNITY): Payer: Self-pay

## 2024-07-23 ENCOUNTER — Telehealth: Payer: Self-pay | Admitting: Pharmacy Technician

## 2024-07-23 NOTE — Telephone Encounter (Signed)
 Pharmacy Patient Advocate Encounter   Received notification from CoverMyMeds that prior authorization for Rybelsus  7MG  tablets  is required/requested.   Insurance verification completed.   The patient is insured through [facility] .   Per test claim: PA required and submitted KEY/EOC/Request #: [id number] from 06/23/24 to 07/23/25. Ran test claim, Copay is $44.99. This test claim was processed through [facility]- copay amounts may vary at other pharmacies due to pharmacy/plan contracts, or as the patient moves through the different stages of their insurance plan.

## 2024-07-24 ENCOUNTER — Ambulatory Visit: Payer: Self-pay

## 2024-07-24 NOTE — Telephone Encounter (Signed)
 Copied from CRM #[id number]. Topic: Clinical - Medication Question >> Jul 24, 2024  3:45 PM Martinique E wrote: Reason for CRM: *Used sign language interpreter for call* Patient went to the pharmacy to pick up her Semaglutide  (RYBELSUS ) 7 MG TABS medication and the copay was $44, which patient cannot afford. Patient questioning if there is a coupon available for this medication. Callback number [phone].

## 2024-07-29 ENCOUNTER — Ambulatory Visit (INDEPENDENT_AMBULATORY_CARE_PROVIDER_SITE_OTHER): Admitting: Orthopaedic Surgery

## 2024-07-29 DIAGNOSIS — M1611 Unilateral primary osteoarthritis, right hip: Secondary | ICD-10-CM | POA: Diagnosis not present

## 2024-07-29 NOTE — Progress Notes (Signed)
 Office Visit Note   Patient: Rhonda Morrow           Date of Birth: Jan 29, 1961           MRN: [id number] Visit Date: 07/29/2024              Requested by: [address], [place], [place] [address] [place],  [place] [address] PCP: Antonio Meth, Jamee SAUNDERS, DO   Assessment & Plan: Visit Diagnoses:  1. Primary osteoarthritis of right hip     Plan: History of Present Illness Rhonda Morrow is a 63 year old female who presents with issues regarding hip surgery clearance.  She has completed all necessary tests for surgical clearance, which were reported as normal. There is a communication issue between her primary care provider and the orthopedic office, each indicating she is waiting for the other to proceed. She seeks clarification on the next steps required to move forward with her hip surgery.  Assessment and Plan Preoperative evaluation for hip surgery Awaiting clearance for hip surgery. Delay due to administrative issues between primary care and orthopedic office. All tests normal. Clerical issue with clearance sheet. - Investigate status of clearance sheet with primary care. - Contact primary care to resolve issues. - Schedule hip surgery once clearance is confirmed.  Follow-Up Instructions: No follow-ups on file.   Orders:  No orders of the defined types were placed in this encounter.  No orders of the defined types were placed in this encounter.     Procedures: No procedures performed   Clinical Data: No additional findings.   Subjective: Chief Complaint  Patient presents with   Right Hip - Pain    HPI  Review of Systems   Objective: Vital Signs: LMP 05/20/2012   Physical Exam  Ortho Exam  Specialty Comments:  No specialty comments available.  Imaging: No results found.   PMFS History: Patient Active Problem List   Diagnosis Date Noted   Gout 06/02/2024   Hearing loss 06/02/2024   Mastoid disorder, unspecified laterality  04/01/2024   Irritable bowel syndrome with both constipation and diarrhea 07/20/2023   Urinary incontinence 01/31/2023   Vaginal itching 01/31/2023   Influenza 11/20/2022   Postmenopausal bleeding 08/18/2022   Acute non-recurrent pansinusitis 06/18/2022   Leg cramps 03/08/2022   Muscle spasm of right lower extremity 03/08/2022   Primary osteoarthritis of right hip 03/01/2022   ACE inhibitor intolerance 01/19/2022   Weakness 12/22/2021   Left lower quadrant abdominal pain 12/22/2021   Slow transit constipation 12/22/2021   Class 3 severe obesity due to excess calories without serious comorbidity with body mass index (BMI) of 40.0 to 44.9 in adult 07/05/2021   Restless leg syndrome 07/05/2021   Chronic seasonal allergic rhinitis due to pollen 07/05/2021   Psychophysiological insomnia 07/05/2021   Non-compliance 07/05/2021   COVID-19 07/05/2021   Right wrist pain 06/05/2021   Acute pain of left shoulder 01/24/2021   Acute pain of right shoulder 01/24/2021   Foot pain, bilateral 01/24/2021   Dyspepsia 01/24/2021   Preop cardiovascular exam 01/24/2021   Right hand pain 12/29/2020   OSA (obstructive sleep apnea) 09/12/2020   Nocturia more than twice per night 08/03/2020   Non-restorative sleep 08/03/2020   Leg cramping 08/03/2020   Hyperlipidemia associated with type 2 diabetes mellitus (HCC) 07/25/2020   Large breasts 07/22/2020   RLS (restless legs syndrome) 05/17/2020   Cough 04/01/2020   Asthma, moderate persistent, poorly-controlled 04/01/2020   Medication management  04/01/2020   Menopausal symptoms 03/29/2020   Hot flashes 03/29/2020   Chronic right shoulder pain 08/11/2019   Acute non-recurrent maxillary sinusitis 08/11/2019   BMI 40.0-44.9, adult (HCC) 01/22/2019   Right lower quadrant abdominal pain 10/20/2018   Rectal pain 10/20/2018   Vitamin B 12 deficiency 03/28/2018   B12 deficiency 03/28/2018   DM (diabetes mellitus) type II uncontrolled, periph vascular  disorder 03/28/2018   Hyperlipidemia 03/28/2018   Primary osteoarthritis of left hip 02/21/2017   Status post total hip replacement, left 02/21/2017   History of laparoscopic cholecystectomy 06/20/2016   Cerumen impaction 06/09/2016   Hypersomnia 05/16/2016   Knee pain, right 05/05/2016   RUQ pain 04/09/2016   Abnormal CT scan 03/28/2016   Dyspnea and respiratory abnormalities 03/15/2016   Asthma with acute exacerbation 03/15/2016   Asthma in adult 02/25/2016   DM (diabetes mellitus) type II controlled, neurological manifestation (HCC) 02/09/2016   Right hip pain 12/23/2015   Right hamstring muscle strain 11/18/2015   Abdominal pain, acute 09/01/2015   Acute asthma exacerbation 04/16/2015   Acute bronchitis 04/14/2015   Pap smear for cervical cancer screening 09/14/2014   Bed bug bite 05/06/2014   Allergic rhinitis 04/09/2014   Rectal itching 04/09/2014   Bladder spasm 04/09/2014   Knee pain, left 03/05/2014   Hypokalemia 02/19/2014   Nausea with vomiting 02/19/2014   Diabetes mellitus, type II (HCC) 02/19/2014   Edema 02/16/2014   Disorder of rotator cuff 11/12/2013   Routine general medical examination at a health care facility 08/20/2013   Gastroesophageal reflux disease 06/26/2013   Dysphagia, pharyngoesophageal phase 06/26/2013   Suprapubic abdominal pain 05/12/2013   Medication side effect 03/25/2013   Diarrhea 03/25/2013   Benign positional vertigo 01/30/2013   Traumatic hematoma of thigh 01/15/2013   HTN (hypertension) 09/02/2012   Dry skin 08/22/2012   External hemorrhoids 08/22/2012   Obesity 06/30/2012   Thyromegaly 05/22/2012   Back pain 05/22/2012   Elevated glucose 05/22/2012   Moderate persistent asthma 04/19/2012   Dyspnea 01/28/2012   Past Medical History:  Diagnosis Date   Arthritis    Asthma    Complication of anesthesia    one time woke up and was vey scared,17 yrs ago   Deaf    Diabetes mellitus without complication (HCC)    GERD  (gastroesophageal reflux disease)    Hypertension    Left groin pain    Nausea with vomiting 02/19/2014   Thyroid  disease     Family History  Problem Relation Age of Onset   Diabetes Mother    Heart disease Mother    Diabetes Father    Colon cancer Neg Hx    Colon polyps Neg Hx    Esophageal cancer Neg Hx    Rectal cancer Neg Hx    Stomach cancer Neg Hx     Past Surgical History:  Procedure Laterality Date   CARDIAC CATHETERIZATION     CESAREAN SECTION     CHOLECYSTECTOMY N/A 06/20/2016   Procedure: LAPAROSCOPIC CHOLECYSTECTOMY;  Surgeon: Donnice Bury, MD;  Location: [facility] OR;  Service: General;  Laterality: N/A;   ESOPHAGEAL MANOMETRY N/A 09/29/2013   Procedure: ESOPHAGEAL MANOMETRY (EM);  Surgeon: Toribio SHAUNNA Cedar, MD;  Location: [facility];  Service: Endoscopy;  Laterality: N/A;   TOTAL HIP ARTHROPLASTY Left 02/21/2017   Procedure: LEFT TOTAL HIP ARTHROPLASTY ANTERIOR APPROACH;  Surgeon: Kay CHRISTELLA Cummins, MD;  Location: [facility] OR;  Service: Orthopedics;  Laterality: Left;   Social History   Occupational History  Occupation: disabled  Tobacco Use   Smoking status: Never   Smokeless tobacco: Never  Vaping Use   Vaping status: Never Used  Substance and Sexual Activity   Alcohol use: No    Alcohol/week: 0.0 standard drinks of alcohol   Drug use: No   Sexual activity: Not Currently

## 2024-07-30 ENCOUNTER — Telehealth: Payer: Self-pay | Admitting: Family Medicine

## 2024-07-30 NOTE — Telephone Encounter (Signed)
 Copied from CRM [id number]. Topic: Referral - Question >> Jul 30, 2024 11:22 AM Timindy P wrote: Reason for CRM: Pt is calling to check on the status of her referral for PT. She is statins Dr. Celestia advised she would be receiving a call from the PT dept to set up appts for her neck. She is asking for a call back for further in formation. Please call [phone] and if not reached, please leave a message

## 2024-07-30 NOTE — Telephone Encounter (Signed)
 LM w/ answering/interpreter service- we currently don't have any copay cards for Rybelsus , she could check GoodRx. Instructed to call office if questions/concerns.

## 2024-07-30 NOTE — Telephone Encounter (Signed)
 Mychart message sent to Pt w/ PT office information.

## 2024-07-30 NOTE — Telephone Encounter (Unsigned)
 Copied from CRM #[id number]. Topic: General - Other >> Jul 30, 2024 11:15 AM Timindy P wrote: Reason for CRM: Pt is calling in  with an interpreter wanting to know if there is a coupon for Rybelsus  in the office so she can come pick it up. Please call [phone], if not reached please leave a voicemail.

## 2024-08-05 ENCOUNTER — Other Ambulatory Visit (HOSPITAL_BASED_OUTPATIENT_CLINIC_OR_DEPARTMENT_OTHER): Payer: Self-pay

## 2024-08-05 MED ORDER — FLUZONE 0.5 ML IM SUSY
0.5000 mL | PREFILLED_SYRINGE | Freq: Once | INTRAMUSCULAR | 0 refills | Status: AC
  Filled 2024-08-05: qty 0.5, 1d supply, fill #0

## 2024-08-05 MED ORDER — COMIRNATY 30 MCG/0.3ML IM SUSY
0.3000 mL | PREFILLED_SYRINGE | Freq: Once | INTRAMUSCULAR | 0 refills | Status: AC
  Filled 2024-08-05: qty 0.3, 1d supply, fill #0

## 2024-08-05 NOTE — Telephone Encounter (Signed)
   Patient Name: Rhonda Morrow  DOB: Jul 02, 1961 MRN: [id number]  Primary Cardiologist: None  Chart reviewed as part of pre-operative protocol coverage. Given past medical history and time since last visit, based on ACC/AHA guidelines, Rhonda Morrow is at acceptable risk for the planned procedure without further cardiovascular testing.   Recent Myoview  and echocardiogram were reassuring.  She is at acceptable risk to proceed with upcoming procedure.  I will route this recommendation to the requesting party via Epic fax function and remove from pre-op pool.  Please call with questions.  Debi Cousin, [place] 08/05/2024, 9:47 AM

## 2024-08-06 ENCOUNTER — Ambulatory Visit: Admitting: Nurse Practitioner

## 2024-08-07 ENCOUNTER — Encounter: Payer: Self-pay | Admitting: Adult Health

## 2024-08-07 ENCOUNTER — Ambulatory Visit

## 2024-08-07 ENCOUNTER — Ambulatory Visit: Admitting: Adult Health

## 2024-08-07 VITALS — BP 124/64 | HR 80 | Temp 97.6°F | Ht 62.25 in | Wt 202.6 lb

## 2024-08-07 DIAGNOSIS — J4540 Moderate persistent asthma, uncomplicated: Secondary | ICD-10-CM

## 2024-08-07 DIAGNOSIS — M25559 Pain in unspecified hip: Secondary | ICD-10-CM | POA: Diagnosis not present

## 2024-08-07 DIAGNOSIS — G4733 Obstructive sleep apnea (adult) (pediatric): Secondary | ICD-10-CM

## 2024-08-07 DIAGNOSIS — M1611 Unilateral primary osteoarthritis, right hip: Secondary | ICD-10-CM | POA: Diagnosis not present

## 2024-08-07 LAB — PULMONARY FUNCTION TEST
DL/VA % pred: 141 %
DL/VA: 6.04 ml/min/mmHg/L
DLCO cor % pred: 70 %
DLCO cor: 13.47 ml/min/mmHg
DLCO unc % pred: 74 %
DLCO unc: 14.12 ml/min/mmHg
FEF 25-75 Pre: 1.15 L/s
FEF2575-%Pred-Pre: 51 %
FEV1-%Pred-Pre: 47 %
FEV1-Pre: 1.14 L
FEV1FVC-%Pred-Pre: 103 %
FEV6-%Pred-Pre: 47 %
FEV6-Pre: 1.42 L
FEV6FVC-%Pred-Pre: 103 %
FVC-%Pred-Pre: 45 %
FVC-Pre: 1.42 L
Pre FEV1/FVC ratio: 80 %
Pre FEV6/FVC Ratio: 100 %
RV % pred: 64 %
RV: 1.22 L
TLC % pred: 56 %
TLC: 2.69 L

## 2024-08-07 NOTE — Patient Instructions (Signed)
 SURGICAL WAITING ROOM VISITATION  Patients having surgery or a procedure may have no more than 2 support people in the waiting area - these visitors may rotate.    Children under the age of 23 must have an adult with them who is not the patient.  Visitors with respiratory illnesses are discouraged from visiting and should remain at home.  If the patient needs to stay at the hospital during part of their recovery, the visitor guidelines for inpatient rooms apply. Pre-op nurse will coordinate an appropriate time for 1 support person to accompany patient in pre-op.  This support person may not rotate.    Please refer to the [facility] website for the visitor guidelines for Inpatients (after your surgery is over and you are in a regular room).       Your procedure is scheduled on: 08/14/24   Report to [facility] Main Entrance    Report to admitting at 7:30 AM   Call this number if you have problems the morning of surgery [phone]   Do not eat food :After Midnight.   After Midnight you may have the following liquids until 6:55 am DAY OF SURGERY  Water Non-Citrus Juices (without pulp, NO RED-Apple, White grape, White cranberry) Black Coffee (NO MILK/CREAM OR CREAMERS, sugar ok)  Clear Tea (NO MILK/CREAM OR CREAMERS, sugar ok) regular and decaf                             Plain Jell-O (NO RED)                                           Fruit ices (not with fruit pulp, NO RED)                                     Popsicles (NO RED)                                                               Sports drinks like Gatorade (NO RED)                  The day of surgery:  Drink ONE (1) Pre-Surgery G2 at 6:55 AM the morning of surgery. Drink in one sitting. Do not sip.  This drink was given to you during your hospital  pre-op appointment visit. Nothing else to drink after completing the  Pre-Surgery  G2.     Oral Hygiene is also important to reduce your risk of infection.                                     Remember - BRUSH YOUR TEETH THE MORNING OF SURGERY WITH YOUR REGULAR TOOTHPASTE  DENTURES WILL BE REMOVED PRIOR TO SURGERY PLEASE DO NOT APPLY Poly grip OR ADHESIVES!!!   Stop all vitamins and herbal supplements 7 days before surgery.   Take these medicines the morning of surgery with A SIP OF WATER: AMLODIPINE , ATORVASTATIN , CETIRIZINE , OMEPRAZOLE , NASAL  SPRAY, INHALER  DO NOT TAKE ANY ORAL DIABETIC MEDICATIONS DAY OF YOUR SURGERY Hold Metformin  and Rebelsus the morning of surgery.  Bring CPAP mask and tubing day of surgery.                              You may not have any metal on your body including hair pins, jewelry, and body piercing             Do not wear make-up, lotions, powders, perfumes/cologne, or deodorant  Do not wear nail polish including gel and S&S, artificial/acrylic nails, or any other type of covering on natural nails including finger and toenails. If you have artificial nails, gel coating, etc. that needs to be removed by a nail salon please have this removed prior to surgery or surgery may need to be canceled/ delayed if the surgeon/ anesthesia feels like they are unable to be safely monitored.   Do not shave  48 hours prior to surgery.    Do not bring valuables to the hospital. [facility] IS NOT             RESPONSIBLE   FOR VALUABLES.   Contacts, glasses, dentures or bridgework may not be worn into surgery.   Bring small overnight bag day of surgery.   DO NOT BRING YOUR HOME MEDICATIONS TO THE HOSPITAL. PHARMACY WILL DISPENSE MEDICATIONS LISTED ON YOUR MEDICATION LIST TO YOU DURING YOUR ADMISSION IN THE HOSPITAL!    Patients discharged on the day of surgery will not be allowed to drive home.  Someone NEEDS to stay with you for the first 24 hours after anesthesia.   Special Instructions: Bring a copy of your healthcare power of attorney and living will documents the day of surgery if you haven't scanned them before.               Please read over the following fact sheets you were given: IF YOU HAVE QUESTIONS ABOUT YOUR PRE-OP INSTRUCTIONS PLEASE CALL [phone] [place]   If you received a COVID test during your pre-op visit  it is requested that you wear a mask when out in public, stay away from anyone that may not be feeling well and notify your surgeon if you develop symptoms. If you test positive for Covid or have been in contact with anyone that has tested positive in the last 10 days please notify you surgeon.      Pre-operative 4 CHG Bath Instructions   You can play a key role in reducing the risk of infection after surgery. Your skin needs to be as free of germs as possible. You can reduce the number of germs on your skin by washing with CHG (chlorhexidine  gluconate) soap before surgery. CHG is an antiseptic soap that kills germs and continues to kill germs even after washing.   DO NOT use if you have an allergy to chlorhexidine /CHG or antibacterial soaps. If your skin becomes reddened or irritated, stop using the CHG and notify one of our RNs at   Please shower with the CHG soap starting 4 days before surgery using the following schedule:     Please keep in mind the following:  DO NOT shave, including legs and underarms, starting the day of your first shower.   You may shave your face at any point before/day of surgery.  Place clean sheets on your bed the day you start using CHG soap. Use a clean washcloth (  not used since being washed) for each shower. DO NOT sleep with pets once you start using the CHG.  CHG Shower Instructions:  If you choose to wash your hair and private area, wash first with your normal shampoo/soap.  After you use shampoo/soap, rinse your hair and body thoroughly to remove shampoo/soap residue.  Turn the water OFF and apply about 3 tablespoons (45 ml) of CHG soap to a CLEAN washcloth.  Apply CHG soap ONLY FROM YOUR NECK DOWN TO YOUR TOES (washing for 3-5 minutes)  DO NOT use CHG  soap on face, private areas, open wounds, or sores.  Pay special attention to the area where your surgery is being performed.  If you are having back surgery, having someone wash your back for you may be helpful. Wait 2 minutes after CHG soap is applied, then you may rinse off the CHG soap.  Pat dry with a clean towel  Put on clean clothes/pajamas   If you choose to wear lotion, please use ONLY the CHG-compatible lotions on the back of this paper.     Additional instructions for the day of surgery: DO NOT APPLY any lotions, deodorants, cologne, or perfumes.   Put on clean/comfortable clothes.  Brush your teeth.  Ask your nurse before applying any prescription medications to the skin.   CHG Compatible Lotions   Aveeno Moisturizing lotion  Cetaphil Moisturizing Cream  Cetaphil Moisturizing Lotion  Clairol Herbal Essence Moisturizing Lotion, Dry Skin  Clairol Herbal Essence Moisturizing Lotion, Extra Dry Skin  Clairol Herbal Essence Moisturizing Lotion, Normal Skin  Curel Age Defying Therapeutic Moisturizing Lotion with Alpha Hydroxy  Curel Extreme Care Body Lotion  Curel Soothing Hands Moisturizing Hand Lotion  Curel Therapeutic Moisturizing Cream, Fragrance-Free  Curel Therapeutic Moisturizing Lotion, Fragrance-Free  Curel Therapeutic Moisturizing Lotion, Original Formula  Eucerin Daily Replenishing Lotion  Eucerin Dry Skin Therapy Plus Alpha Hydroxy Crme  Eucerin Dry Skin Therapy Plus Alpha Hydroxy Lotion  Eucerin Original Crme  Eucerin Original Lotion  Eucerin Plus Crme Eucerin Plus Lotion  Eucerin TriLipid Replenishing Lotion  Keri Anti-Bacterial Hand Lotion  Keri Deep Conditioning Original Lotion Dry Skin Formula Softly Scented  Keri Deep Conditioning Original Lotion, Fragrance Free Sensitive Skin Formula  Keri Lotion Fast Absorbing Fragrance Free Sensitive Skin Formula  Keri Lotion Fast Absorbing Softly Scented Dry Skin Formula  Keri Original Lotion  Keri Skin Renewal  Lotion Keri Silky Smooth Lotion  Keri Silky Smooth Sensitive Skin Lotion  Nivea Body Creamy Conditioning Oil  Nivea Body Extra Enriched Lotion  Nivea Body Original Lotion  Nivea Body Sheer Moisturizing Lotion Nivea Crme  Nivea Skin Firming Lotion  NutraDerm 30 Skin Lotion  NutraDerm Skin Lotion  NutraDerm Therapeutic Skin Cream  NutraDerm Therapeutic Skin Lotion  ProShield Protective Hand Cream  Incentive Spirometer  An incentive spirometer is a tool that can help keep your lungs clear and active. This tool measures how well you are filling your lungs with each breath. Taking long deep breaths may help reverse or decrease the chance of developing breathing (pulmonary) problems (especially infection) following: A long period of time when you are unable to move or be active. BEFORE THE PROCEDURE  If the spirometer includes an indicator to show your best effort, your nurse or respiratory therapist will set it to a desired goal. If possible, sit up straight or lean slightly forward. Try not to slouch. Hold the incentive spirometer in an upright position. INSTRUCTIONS FOR USE  Sit on the edge of your bed if  possible, or sit up as far as you can in bed or on a chair. Hold the incentive spirometer in an upright position. Breathe out normally. Place the mouthpiece in your mouth and seal your lips tightly around it. Breathe in slowly and as deeply as possible, raising the piston or the ball toward the top of the column. Hold your breath for 3-5 seconds or for as long as possible. Allow the piston or ball to fall to the bottom of the column. Remove the mouthpiece from your mouth and breathe out normally. Rest for a few seconds and repeat Steps 1 through 7 at least 10 times every 1-2 hours when you are awake. Take your time and take a few normal breaths between deep breaths. The spirometer may include an indicator to show your best effort. Use the indicator as a goal to work toward during each  repetition. After each set of 10 deep breaths, practice coughing to be sure your lungs are clear. If you have an incision (the cut made at the time of surgery), support your incision when coughing by placing a pillow or rolled up towels firmly against it. Once you are able to get out of bed, walk around indoors and cough well. You may stop using the incentive spirometer when instructed by your caregiver.  RISKS AND COMPLICATIONS Take your time so you do not get dizzy or light-headed. If you are in pain, you may need to take or ask for pain medication before doing incentive spirometry. It is harder to take a deep breath if you are having pain. AFTER USE Rest and breathe slowly and easily. It can be helpful to keep track of a log of your progress. Your caregiver can provide you with a simple table to help with this. If you are using the spirometer at home, follow these instructions: SEEK MEDICAL CARE IF:  You are having difficultly using the spirometer. You have trouble using the spirometer as often as instructed. Your pain medication is not giving enough relief while using the spirometer. You develop fever of 100.5 F (38.1 C) or higher. SEEK IMMEDIATE MEDICAL CARE IF:  You cough up bloody sputum that had not been present before. You develop fever of 102 F (38.9 C) or greater. You develop worsening pain at or near the incision site. MAKE SURE YOU:  Understand these instructions. Will watch your condition. Will get help right away if you are not doing well or get worse. Document Released: 03/05/2007 Document Revised: 01/15/2012 Document Reviewed: 05/06/2007 [facility] Patient Information 2014 [place], [place].

## 2024-08-07 NOTE — Progress Notes (Signed)
 COVID Vaccine received:  []  No [x]  Yes Date of any COVID positive Test in last 90 days: no PCP - Jamee Antonio Meth DO Cardiologist - [facility]  Chest x-ray - 03/06/24 Epic EKG -  06/03/24 Epic Stress Test - 07/10/24 Epic ECHO - 07/03/24 Epic Cardiac Cath -   Cardiac clearance-08/05/24- Scot Ford PA  Bowel Prep - [x]  No  []   Yes ______  Pacemaker / ICD device [x]  No []  Yes   Spinal Cord Stimulator:[x]  No []  Yes       History of Sleep Apnea? []  No [x]  Yes   CPAP used?- [x]  No []  Yes    Does the patient monitor blood sugar?          [x]  No []  Yes  []  N/A  Patient has: []  NO Hx DM   []  Pre-DM                 []  DM1  [x]   DM2 Does patient have a [employer] or Dexacom? [x]  No []  Yes   Fasting Blood Sugar Ranges-  Checks Blood Sugar ___0__ times a day  GLP1 agonist / usual dose - no GLP1 instructions:  SGLT-2 inhibitors / usual dose - no SGLT-2 instructions:   Blood Thinner / Instructions:no Aspirin  Instructions:no  Comments:   Activity level: Patient is able / unable to climb a flight of stairs without difficulty; [x]  No CP  [x]  No SOB, but would have ___   Patient can perform ADLs without assistance.   Anesthesia review: HTN, DM, Sleep apnea- Cpap noncompliant  Patient denies shortness of breath, fever, cough and chest pain at PAT appointment.  Patient verbalized understanding and agreement to the Pre-Surgical Instructions that were given to them at this PAT appointment. Patient was also educated of the need to review these PAT instructions again prior to his/her surgery.I reviewed the appropriate phone numbers to call if they have any and questions or concerns.

## 2024-08-07 NOTE — Patient Instructions (Signed)
 Trelegy 1 puff daily, Rinse after use  Albuterol  inhaler As needed   Continue on Zyrtec  and Flonase  daily  Good luck with upcoming surgery  Follow up in Dr. Annella or Brazil Voytko NP in 4-6 months with PFT  Please contact office for sooner follow up if symptoms do not improve or worsen or seek emergency care

## 2024-08-07 NOTE — Progress Notes (Signed)
 @Patient  ID: Rhonda Morrow, female    DOB: 1961/08/23, 63 y.o.   MRN: [id number]  Chief Complaint  Patient presents with   Medical Management of Chronic Issues    Referring provider: Antonio Cyndee Jamee JONELLE, *  HPI: 63 year old female never smoker followed for asthma and allergic rhinitis Patient is deaf/mute-needs interpreter for office visits Mild OSA -CPAP intolerant    TEST/EVENTS :  PFt date 01/24/12 shows mixed obstruction - restriction but greater restriction with low dlco   - fev1 1/48L/58%, Ratio 82, TLC 56,  DLCO 14/51%, No BD response   CT chest 02/09/2012 showed no evidence of scarring   Chest xray 11/2022 Bibasilar atlectasis  Chest x-ray Mar 06, 2024 clear lungs  Sleep study 05/2016 neg for OSA  Sleep study 07/14/20-Mild OSA    Discussed the use of AI scribe software for clinical note transcription with the patient, who gave verbal consent to proceed.  History of Present Illness Rhonda Morrow is a 63 year old female with asthma who presents for a pulmonary follow-up and pre-operative evaluation.  Accompanied by an interpreter.  She is scheduled for hip surgery on October 9th.  Today had pulmonary function testing that shows moderate restriction with an FEV1 at 47%, ratio 80, FVC 45%, DLCO 74%.  Patient had difficulty doing PFT due to ongoing hip pain.  . The seat used during the test was uncomfortable, exacerbating her hip pain.  She has asthma and uses a Trelegy inhaler once daily. She sometimes experiences difficulty breathing, which she manages with her inhaler and occasionally requires an emergency inhaler.  Overall feels her breathing is doing okay.  No flare of cough or wheezing.  No increased albuterol  use.  She recently received both the flu and COVID vaccines.   She experiences significant hip pain, which affects her ability to sit comfortably and perform certain activities. She often keeps her feet elevated to alleviate discomfort. She expresses fear about  the upcoming surgery, despite having undergone a similar procedure on her other hip in the past.  Has mild obstructive sleep apnea.  She could not tolerate CPAP.   Allergies  Allergen Reactions   Losartan  Shortness Of Breath   Oxycodone -Acetaminophen  Nausea And Vomiting   Augmentin  [Amoxicillin -Pot Clavulanate] Diarrhea    Immunization History  Administered Date(s) Administered   DT (Pediatric) 04/07/2011   Influenza Split 08/07/2011, 08/05/2012, 08/22/2012, 08/31/2014   Influenza, Seasonal, Injecte, Preservative Fre 07/18/2023, 08/05/2024   Influenza,inj,Quad PF,6+ Mos 08/20/2013, 10/22/2015, 08/01/2016, 09/11/2017, 08/06/2018, 07/15/2019, 07/22/2020, 07/18/2021, 08/18/2022   Influenza,trivalent, recombinat, inj, PF 12/18/2011   PFIZER(Purple Top)SARS-COV-2 Vaccination 12/28/2019, 01/20/2020, 08/23/2020   Pfizer Covid-19 Vaccine Bivalent Booster 80yrs & up 10/11/2021   Pfizer(Comirnaty )Fall Seasonal Vaccine 12 years and older 09/19/2022, 07/18/2023, 08/05/2024   Pneumococcal Polysaccharide-23 10/02/2012, 07/22/2020   Tdap 12/18/2011   Zoster Recombinant(Shingrix ) 08/17/2020, 10/19/2020, 07/20/2023    Past Medical History:  Diagnosis Date   Arthritis    Asthma    Complication of anesthesia    one time woke up and was vey scared,17 yrs ago   Deaf    Diabetes mellitus without complication (HCC)    GERD (gastroesophageal reflux disease)    Hypertension    Left groin pain    Nausea with vomiting 02/19/2014   Thyroid  disease     Tobacco History: Social History   Tobacco Use  Smoking Status Never  Smokeless Tobacco Never   Counseling given: Not Answered   Outpatient Medications Prior to Visit  Medication Sig Dispense Refill  albuterol  (VENTOLIN  HFA) 108 (90 Base) MCG/ACT inhaler Inhale 1-2 puffs into the lungs every 6 (six) hours as needed for wheezing or shortness of breath. 18 each 3   allopurinol  (ZYLOPRIM ) 100 MG tablet Take 1 tablet (100 mg total) by mouth  daily. 90 tablet 0   amLODipine  (NORVASC ) 5 MG tablet Take 1 tablet (5 mg total) by mouth daily. 90 tablet 0   atorvastatin  (LIPITOR) 20 MG tablet TAKE 1 TABLET BY MOUTH EVERY DAY 90 tablet 1   Azelastine  HCl 137 MCG/SPRAY SOLN PLACE 2 SPRAYS INTO BOTH NOSTRILS 2 (TWO) TIMES DAILY. USE IN EACH NOSTRIL AS DIRECTED (Patient taking differently: as needed.) 30 mL 3   blood glucose meter kit and supplies KIT Dispense based on patient and insurance preference. Use up to four times daily as directed. (FOR ICD-9 250.00, 250.01). 1 each 0   cetirizine  (ZYRTEC ) 10 MG tablet TAKE 1 TABLET BY MOUTH EVERY DAY 90 tablet 2   COVID-19 mRNA vaccine, Pfizer, (COMIRNATY ) syringe Inject 0.3 mLs into the muscle once for 1 dose. 0.3 mL 0   diclofenac  (VOLTAREN ) 75 MG EC tablet Take 1 tablet (75 mg total) by mouth 2 (two) times daily. 60 tablet 0   fluticasone  (FLONASE ) 50 MCG/ACT nasal spray Place 2 sprays into both nostrils daily. (Patient taking differently: Place 2 sprays into both nostrils daily as needed for allergies.) 16 g 6   Fluticasone -Umeclidin-Vilant (TRELEGY ELLIPTA ) 100-62.5-25 MCG/ACT AEPB Inhale 1 puff into the lungs daily. 1 each 5   influenza vac split trivalent PF (FLUZONE) 0.5 ML injection Inject 0.5 mLs into the muscle once for 1 dose. 0.5 mL 0   meclizine  (ANTIVERT ) 25 MG tablet Take 1 tablet (25 mg total) by mouth 3 (three) times daily as needed for dizziness. 20 tablet 0   metFORMIN  (GLUCOPHAGE -XR) 500 MG 24 hr tablet Take 2 tablets (1,000 mg total) by mouth daily with breakfast. 180 tablet 1   Na Sulfate-K Sulfate-Mg Sulfate concentrate (SUPREP) 17.5-3.13-1.6 GM/177ML SOLN Take 1 kit by mouth See admin instructions.     omeprazole  (PRILOSEC) 20 MG capsule Take 20 mg by mouth in the morning and at bedtime.     potassium chloride  (KLOR-CON ) 10 MEQ tablet Take 1 tablet (10 mEq total) by mouth 2 (two) times daily. 180 tablet 1   Semaglutide  (RYBELSUS ) 3 MG TABS Take 3 mg by mouth daily.      Semaglutide  (RYBELSUS ) 7 MG TABS Take 1 tablet (7 mg total) by mouth daily. 90 tablet 1   solifenacin  (VESICARE ) 5 MG tablet TAKE 1 TABLET (5 MG TOTAL) BY MOUTH DAILY. (Patient taking differently: Take 5 mg by mouth daily as needed (bladder control).) 90 tablet 1   Facility-Administered Medications Prior to Visit  Medication Dose Route Frequency Provider Last Rate Last Admin   alum & mag hydroxide-simeth (MAALOX/MYLANTA) 200-200-20 MG/5ML suspension 30 mL  30 mL Oral Once Saguier, Edward, PA-C       lidocaine  (XYLOCAINE ) 2 % viscous mouth solution 15 mL  15 mL Mouth/Throat Once Saguier, Edward, PA-C       nitroGLYCERIN  (NITRODUR - Dosed in mg/24 hr) patch 0.2 mg  0.2 mg Transdermal Daily Magdalen Pasco RAMAN, DPM         Review of Systems:   Constitutional:   No  weight loss, night sweats,  Fevers, chills, + fatigue, or  lassitude.  HEENT:   No headaches,  Difficulty swallowing,  Tooth/dental problems, or  Sore throat,  No sneezing, itching, ear ache, nasal congestion, post nasal drip, +deaf  CV:  No chest pain,  Orthopnea, PND, swelling in lower extremities, anasarca, dizziness, palpitations, syncope.   GI  No heartburn, indigestion, abdominal pain, nausea, vomiting, diarrhea, change in bowel habits, loss of appetite, bloody stools.   Resp: .  No chest wall deformity  Skin: no rash or lesions.  GU: no dysuria, change in color of urine, no urgency or frequency.  No flank pain, no hematuria   MS:  +hip pain     Physical Exam  BP 124/64   Pulse 80   Temp 97.6 F (36.4 C)   Ht 5' 2.25 (1.581 m)   Wt 202 lb 9.6 oz (91.9 kg)   LMP 05/20/2012   SpO2 96% Comment: RA  BMI 36.76 kg/m   GEN: A/Ox3; pleasant , NAD, well nourished    HEENT:  [place]/AT,   NOSE-clear, THROAT-clear, no lesions, no postnasal drip or exudate noted. +deaf  NECK:  Supple w/ fair ROM; no JVD; normal carotid impulses w/o bruits; no thyromegaly or nodules palpated; no lymphadenopathy.    RESP   Clear  P & A; w/o, wheezes/ rales/ or rhonchi. no accessory muscle use, no dullness to percussion  CARD:  RRR, no m/r/g, no peripheral edema, pulses intact, no cyanosis or clubbing.  GI:   Soft & nt; nml bowel sounds; no organomegaly or masses detected.   Musco: Warm bil, no deformities or joint swelling noted.   Neuro: alert, no focal deficits noted.    Skin: Warm, no lesions or rashes    Administration History     None          Latest Ref Rng & Units 08/07/2024    1:10 PM 01/22/2019    2:35 PM 07/21/2013   12:03 PM  PFT Results  FVC-Pre L 1.42  P 1.60    FVC-Predicted Pre % 45  P 61  56      FVC-Post L  1.66    FVC-Predicted Post %  63    Pre FEV1/FVC % % 80  P 89  89      Post FEV1/FCV % %  88    FEV1-Pre L 1.14  P 1.42  1.37      FEV1-Predicted Pre % 47  P 69  62      FEV1-Post L  1.45    DLCO uncorrected ml/min/mmHg 14.12  P 17.81    DLCO UNC% % 74  P 91    DLCO corrected ml/min/mmHg 13.47  P 17.03    DLCO COR %Predicted % 70  P 87    DLVA Predicted % 141  P 148    TLC L 2.69  P 3.13    TLC % Predicted % 56  P 65    RV % Predicted % 64  P 65      P Preliminary result   This result is from an external source.    No results found for: NITRICOXIDE      Assessment & Plan:   Assessment and Plan Assessment & Plan Moderate persistent asthma   Asthma is moderate and persistent with a slight decrease in pulmonary function compared to the previous testing  complicated by hip pain during testing. No wheezing detected. Breathing difficulties are occasionally managed with an emergency inhaler. Continue Trelegy inhaler once daily. Ensure use of Trelegy inhaler on the morning of surgery. Overall appears clinically stable. Chest xray May clear lung in never smoker  Mild OSA - CPAP intolerant. Positional sleep with head of bed elevated at 30 degrees.   DJD-Hip-plans for hip replacement. Severe hip joint pain due to deteriorating osteoarthritis significantly  affects her mobility and quality of life.  She is scheduled for surgery next week.  Pulmonary preop risk assessment  -patient would be considered a mild to moderate surgical risk from a pulmonary standpoint.  Currently asthma appears to be well-controlled.  She does have mild obstructive sleep apnea not on CPAP.  Major Pulmonary risks identified in the multifactorial risk analysis are but not limited to a) pneumonia; b) recurrent intubation risk; c) prolonged or recurrent acute respiratory failure needing mechanical ventilation; d) prolonged hospitalization; e) DVT/Pulmonary embolism; f) Acute Pulmonary edema   Recommend 1. Short duration of surgery as much as possible and avoid paralytic if possible 2. CPAP in post op setting if indicated.  3. DVT prophylaxis per protocol  4. Aggressive pulmonary toilet with o2, bronchodilatation, and incentive spirometry and early ambulation Use caution with sedating medications .         Madelin Stank, NP 08/07/2024

## 2024-08-07 NOTE — Progress Notes (Signed)
 Full pft w/o post performed today due to patient being late.

## 2024-08-07 NOTE — Patient Instructions (Signed)
 Full pft w/o post performed today due to patient being late.

## 2024-08-08 ENCOUNTER — Encounter (HOSPITAL_COMMUNITY)
Admission: RE | Admit: 2024-08-08 | Discharge: 2024-08-08 | Disposition: A | Discharge status: Home / Self Care | Source: Ambulatory Visit | Attending: Orthopaedic Surgery | Admitting: Orthopaedic Surgery

## 2024-08-08 ENCOUNTER — Encounter (HOSPITAL_COMMUNITY): Payer: Self-pay

## 2024-08-08 ENCOUNTER — Other Ambulatory Visit: Payer: Self-pay

## 2024-08-08 VITALS — BP 174/86 | HR 64 | Resp 16 | Ht 64.0 in | Wt 202.0 lb

## 2024-08-08 DIAGNOSIS — H9190 Unspecified hearing loss, unspecified ear: Secondary | ICD-10-CM | POA: Insufficient documentation

## 2024-08-08 DIAGNOSIS — J4540 Moderate persistent asthma, uncomplicated: Secondary | ICD-10-CM | POA: Diagnosis not present

## 2024-08-08 DIAGNOSIS — I493 Ventricular premature depolarization: Secondary | ICD-10-CM | POA: Diagnosis not present

## 2024-08-08 DIAGNOSIS — G4733 Obstructive sleep apnea (adult) (pediatric): Secondary | ICD-10-CM | POA: Insufficient documentation

## 2024-08-08 DIAGNOSIS — M1611 Unilateral primary osteoarthritis, right hip: Secondary | ICD-10-CM | POA: Insufficient documentation

## 2024-08-08 DIAGNOSIS — E1140 Type 2 diabetes mellitus with diabetic neuropathy, unspecified: Secondary | ICD-10-CM | POA: Diagnosis not present

## 2024-08-08 DIAGNOSIS — K219 Gastro-esophageal reflux disease without esophagitis: Secondary | ICD-10-CM | POA: Insufficient documentation

## 2024-08-08 DIAGNOSIS — Z01818 Encounter for other preprocedural examination: Secondary | ICD-10-CM

## 2024-08-08 DIAGNOSIS — I10 Essential (primary) hypertension: Secondary | ICD-10-CM | POA: Insufficient documentation

## 2024-08-08 DIAGNOSIS — Z01812 Encounter for preprocedural laboratory examination: Secondary | ICD-10-CM | POA: Diagnosis not present

## 2024-08-08 DIAGNOSIS — Z7984 Long term (current) use of oral hypoglycemic drugs: Secondary | ICD-10-CM | POA: Diagnosis not present

## 2024-08-08 HISTORY — DX: Depression, unspecified: F32.A

## 2024-08-08 LAB — BASIC METABOLIC PANEL WITH GFR
Anion gap: 11 (ref 5–15)
BUN: 16 mg/dL (ref 6–20)
CO2: 25 mmol/L (ref 22–32)
Calcium: 9.9 mg/dL (ref 8.9–10.3)
Chloride: 105 mmol/L (ref 98–111)
Creatinine, Ser: 0.83 mg/dL (ref 0.44–1.00)
GFR, Estimated: >60 mL/min (ref >60)
Glucose, Bld: 121 mg/dL — ABNORMAL HIGH (ref 70–99)
Potassium: 3.5 mmol/L (ref 3.5–5.1)
Sodium: 141 mmol/L (ref 135–145)

## 2024-08-08 LAB — GLUCOSE, CAPILLARY: Glucose-Capillary: 131 mg/dL — ABNORMAL HIGH (ref 70–99)

## 2024-08-08 LAB — SURGICAL PCR SCREEN
MRSA, PCR: NEGATIVE
Staphylococcus aureus: NEGATIVE

## 2024-08-11 ENCOUNTER — Encounter (HOSPITAL_COMMUNITY): Payer: Self-pay

## 2024-08-11 NOTE — Progress Notes (Signed)
 Case: [id number] Date/Time: 08/14/24 0942   Procedure: ARTHROPLASTY, HIP, TOTAL, ANTERIOR APPROACH (Right: Hip)   Anesthesia type: Spinal   Diagnosis: Primary osteoarthritis of right hip [M16.11]   Pre-op diagnosis: right hip osteoarthritis   Location: [facility] ROOM 10 / [facility]   Surgeons: Jerri Kay HERO, MD       DISCUSSION: Rhonda Morrow is a 63 yo female with PMH of HTN, frequent PVCs, Deaf (needs sign language interpreter), asthma, GERD, DM, hypothyroid, arthritis, depression.  Prior anesthesia complication includes intra-op awareness  Patient was evaluated by Cardiology on 06/03/24 for pre op clearance prior to upcoming surgery. She reported DOE and episodic chest pain so a stress test was ordered and done on 07/10/24 and was low risk without evidence of ischemia. Echo done on 07/03/24 showed normal LVEF with grade I DD, and no significant valvular disease. She was cleared from cardiac perspective:  Chart reviewed as part of pre-operative protocol coverage. Given past medical history and time since last visit, based on ACC/AHA guidelines, Rhonda Morrow is at acceptable risk for the planned procedure without further cardiovascular testing.  Recent Myoview  and echocardiogram were reassuring.  She is at acceptable risk to proceed with upcoming procedure.  Patient seen by Pulmonology on 08/07/24 for moderate persistent asthma and mild OSA. She had PFTs done which showed moderate restriction with an FEV1 at 47%, ratio 80, FVC 45%, DLCO 74%. She uses her inhalers. Risk assessment given:  Pulmonary preop risk assessment  -patient would be considered a mild to moderate surgical risk from a pulmonary standpoint.  Currently asthma appears to be well-controlled.  She does have mild obstructive sleep apnea not on CPAP.   VS: BP (!) 174/86   Pulse 64   Resp 16   Ht 5' 4 (1.626 m)   Wt 91.6 kg   LMP 05/20/2012   SpO2 99%   BMI 34.67 kg/m   PROVIDERS: Antonio Cyndee Jamee JONELLE, DO   LABS: Labs  reviewed: Acceptable for surgery. (all labs ordered are listed, but only abnormal results are displayed)  Labs Reviewed  BASIC METABOLIC PANEL WITH GFR - Abnormal; Notable for the following components:      Result Value   Glucose, Bld 121 (*)    All other components within normal limits  GLUCOSE, CAPILLARY - Abnormal; Notable for the following components:   Glucose-Capillary 131 (*)    All other components within normal limits  SURGICAL PCR SCREEN  TYPE AND SCREEN     EKG 06/03/24:  Sinus rhythm with frequent Premature ventricular complexes in a pattern of bigeminy Left anterior fascicular block Possible Anterolateral infarct , age undetermined  Stress test 07/10/24:    The study is normal. The study is low risk.   No ST deviation was noted. The ECG was not diagnostic due to pharmacologic protocol.   LV perfusion is normal. There is no evidence of ischemia. There is no evidence of infarction.   Left ventricular function is normal. Nuclear stress EF: 65%. The left ventricular ejection fraction is normal (55-65%). End diastolic cavity size is normal. End systolic cavity size is normal.   CT images were obtained for attenuation correction and were examined for the presence of coronary calcium  when appropriate.   Coronary calcium  was absent on the attenuation correction CT images.   Prior study available for comparison from 05/11/2016. No changes compared to prior study.   Patient initially in bigeminy with starting blood pressure of 210/49, had PVCs throughout study and then was in  bigeminy again at the end of the study. PVCs likely etiology of abnormal wall motion, would use echo images rather than nuclear study to evaluate function/wall motion.   Echo 07/03/24:  IMPRESSIONS    1. TDS. Left ventricular ejection fraction, by estimation, is 55 to 60%. The left ventricle has normal function. Left ventricular endocardial border not optimally defined to evaluate regional wall motion.  Left ventricular diastolic parameters are consistent with Grade I diastolic dysfunction (impaired relaxation).  2. Right ventricular systolic function is normal. The right ventricular size is normal.  3. The mitral valve is normal in structure. No evidence of mitral valve regurgitation. No evidence of mitral stenosis.  4. The aortic valve is normal in structure. Aortic valve regurgitation is not visualized. No aortic stenosis is present.  5. The inferior vena cava is normal in size with greater than 50% respiratory variability, suggesting right atrial pressure of 3 mmHg. Past Medical History:  Diagnosis Date   Arthritis    Asthma    Complication of anesthesia    one time woke up and was vey scared,17 yrs ago   Deaf    Depression    Diabetes mellitus without complication (HCC)    GERD (gastroesophageal reflux disease)    Hypertension    Left groin pain    Nausea with vomiting 02/19/2014   Thyroid  disease     Past Surgical History:  Procedure Laterality Date   CARDIAC CATHETERIZATION     CESAREAN SECTION     CHOLECYSTECTOMY N/A 06/20/2016   Procedure: LAPAROSCOPIC CHOLECYSTECTOMY;  Surgeon: Donnice Bury, MD;  Location: [facility] OR;  Service: General;  Laterality: N/A;   ESOPHAGEAL MANOMETRY N/A 09/29/2013   Procedure: ESOPHAGEAL MANOMETRY (EM);  Surgeon: Toribio SHAUNNA Cedar, MD;  Location: [facility];  Service: Endoscopy;  Laterality: N/A;   TOTAL HIP ARTHROPLASTY Left 02/21/2017   Procedure: LEFT TOTAL HIP ARTHROPLASTY ANTERIOR APPROACH;  Surgeon: Kay CHRISTELLA Cummins, MD;  Location: [facility] OR;  Service: Orthopedics;  Laterality: Left;    MEDICATIONS:  albuterol  (VENTOLIN  HFA) 108 (90 Base) MCG/ACT inhaler   allopurinol  (ZYLOPRIM ) 100 MG tablet   amLODipine  (NORVASC ) 5 MG tablet   atorvastatin  (LIPITOR) 20 MG tablet   Azelastine  HCl 137 MCG/SPRAY SOLN   blood glucose meter kit and supplies KIT   cetirizine  (ZYRTEC ) 10 MG tablet   diclofenac  (VOLTAREN ) 75 MG EC tablet   fluticasone   (FLONASE ) 50 MCG/ACT nasal spray   Fluticasone -Umeclidin-Vilant (TRELEGY ELLIPTA ) 100-62.5-25 MCG/ACT AEPB   meclizine  (ANTIVERT ) 25 MG tablet   metFORMIN  (GLUCOPHAGE -XR) 500 MG 24 hr tablet   Na Sulfate-K Sulfate-Mg Sulfate concentrate (SUPREP) 17.5-3.13-1.6 GM/177ML SOLN   omeprazole  (PRILOSEC) 20 MG capsule   potassium chloride  (KLOR-CON ) 10 MEQ tablet   Semaglutide  (RYBELSUS ) 3 MG TABS   Semaglutide  (RYBELSUS ) 7 MG TABS   solifenacin  (VESICARE ) 5 MG tablet    alum & mag hydroxide-simeth (MAALOX/MYLANTA) 200-200-20 MG/5ML suspension 30 mL   lidocaine  (XYLOCAINE ) 2 % viscous mouth solution 15 mL   nitroGLYCERIN  (NITRODUR - Dosed in mg/24 hr) patch 0.2 mg   My Rinke M Mairyn Lenahan, PA-C [facility] Short Stay/Anesthesiology [facility] Phone [phone] 08/11/2024 1:37 PM

## 2024-08-13 ENCOUNTER — Other Ambulatory Visit: Payer: Self-pay | Admitting: Physician Assistant

## 2024-08-13 MED ORDER — DOCUSATE SODIUM 100 MG PO CAPS: 100.0000 mg | ORAL_CAPSULE | Freq: Every day | ORAL | 2 refills | Status: DC | PRN

## 2024-08-13 MED ORDER — METHOCARBAMOL 750 MG PO TABS
750.0000 mg | ORAL_TABLET | Freq: Three times a day (TID) | ORAL | 2 refills | Status: DC | PRN
Start: 2024-08-13 — End: 2024-08-29

## 2024-08-13 MED ORDER — DOXYCYCLINE HYCLATE 100 MG PO CAPS: 100.0000 mg | ORAL_CAPSULE | Freq: Two times a day (BID) | ORAL | 0 refills | Status: DC

## 2024-08-13 MED ORDER — HYDROCODONE-ACETAMINOPHEN 7.5-325 MG PO TABS: 1.0000 | ORAL_TABLET | Freq: Four times a day (QID) | ORAL | 0 refills | Status: DC | PRN

## 2024-08-13 MED ORDER — ASPIRIN 81 MG PO CHEW: 81.0000 mg | CHEWABLE_TABLET | Freq: Two times a day (BID) | ORAL | 0 refills | Status: DC

## 2024-08-13 MED ORDER — ONDANSETRON HCL 4 MG PO TABS: 4.0000 mg | ORAL_TABLET | Freq: Three times a day (TID) | ORAL | 0 refills | Status: AC | PRN

## 2024-08-14 ENCOUNTER — Encounter (HOSPITAL_COMMUNITY)
Admission: RE | Disposition: A | Discharge status: Home / Self Care | Payer: Self-pay | Source: Home / Self Care | Attending: Orthopaedic Surgery

## 2024-08-14 ENCOUNTER — Other Ambulatory Visit: Payer: Self-pay

## 2024-08-14 ENCOUNTER — Observation Stay (HOSPITAL_COMMUNITY)
Admission: RE | Admit: 2024-08-14 | Discharge: 2024-08-15 | Disposition: A | Discharge status: Home / Self Care | Attending: Orthopaedic Surgery | Admitting: Orthopaedic Surgery

## 2024-08-14 ENCOUNTER — Encounter (HOSPITAL_COMMUNITY): Payer: Self-pay | Admitting: Orthopaedic Surgery

## 2024-08-14 ENCOUNTER — Ambulatory Visit (HOSPITAL_COMMUNITY)

## 2024-08-14 ENCOUNTER — Observation Stay (HOSPITAL_COMMUNITY)

## 2024-08-14 ENCOUNTER — Ambulatory Visit (HOSPITAL_COMMUNITY): Admitting: Anesthesiology

## 2024-08-14 ENCOUNTER — Ambulatory Visit (HOSPITAL_COMMUNITY): Payer: Self-pay | Admitting: Medical

## 2024-08-14 DIAGNOSIS — Z7982 Long term (current) use of aspirin: Secondary | ICD-10-CM | POA: Insufficient documentation

## 2024-08-14 DIAGNOSIS — Z96641 Presence of right artificial hip joint: Secondary | ICD-10-CM

## 2024-08-14 DIAGNOSIS — Z79899 Other long term (current) drug therapy: Secondary | ICD-10-CM | POA: Insufficient documentation

## 2024-08-14 DIAGNOSIS — E119 Type 2 diabetes mellitus without complications: Secondary | ICD-10-CM | POA: Diagnosis not present

## 2024-08-14 DIAGNOSIS — J45909 Unspecified asthma, uncomplicated: Secondary | ICD-10-CM | POA: Insufficient documentation

## 2024-08-14 DIAGNOSIS — Z7984 Long term (current) use of oral hypoglycemic drugs: Secondary | ICD-10-CM | POA: Diagnosis not present

## 2024-08-14 DIAGNOSIS — Z96642 Presence of left artificial hip joint: Secondary | ICD-10-CM | POA: Diagnosis not present

## 2024-08-14 DIAGNOSIS — M25551 Pain in right hip: Secondary | ICD-10-CM | POA: Diagnosis present

## 2024-08-14 DIAGNOSIS — Z471 Aftercare following joint replacement surgery: Secondary | ICD-10-CM | POA: Diagnosis not present

## 2024-08-14 DIAGNOSIS — I10 Essential (primary) hypertension: Secondary | ICD-10-CM | POA: Insufficient documentation

## 2024-08-14 DIAGNOSIS — Z96643 Presence of artificial hip joint, bilateral: Secondary | ICD-10-CM | POA: Diagnosis not present

## 2024-08-14 DIAGNOSIS — M1611 Unilateral primary osteoarthritis, right hip: Secondary | ICD-10-CM | POA: Diagnosis not present

## 2024-08-14 HISTORY — PX: TOTAL HIP ARTHROPLASTY: SHX124

## 2024-08-14 LAB — TYPE AND SCREEN
ABO/RH(D): O POS
Antibody Screen: NEGATIVE

## 2024-08-14 LAB — CBC
HCT: 44.7 % (ref 36.0–46.0)
Hemoglobin: 13.5 g/dL (ref 12.0–15.0)
MCH: 26.9 pg (ref 26.0–34.0)
MCHC: 30.2 g/dL (ref 30.0–36.0)
MCV: 89.2 fL (ref 80.0–100.0)
Platelets: 166 K/uL (ref 150–400)
RBC: 5.01 MIL/uL (ref 3.87–5.11)
RDW: 12.9 % (ref 11.5–15.5)
WBC: 10.9 K/uL — ABNORMAL HIGH (ref 4.0–10.5)
nRBC: 0 % (ref 0.0–0.2)

## 2024-08-14 LAB — GLUCOSE, CAPILLARY
Glucose-Capillary: 140 mg/dL — ABNORMAL HIGH (ref 70–99)
Glucose-Capillary: 215 mg/dL — ABNORMAL HIGH (ref 70–99)
Glucose-Capillary: 286 mg/dL — ABNORMAL HIGH (ref 70–99)

## 2024-08-14 SURGERY — ARTHROPLASTY, HIP, TOTAL, ANTERIOR APPROACH
Anesthesia: General | Site: Hip | Laterality: Right

## 2024-08-14 MED ORDER — SORBITOL 70 % SOLN: 30.0000 mL | Freq: Every day | Status: DC | PRN

## 2024-08-14 MED ORDER — DOCUSATE SODIUM 100 MG PO CAPS
100.0000 mg | ORAL_CAPSULE | Freq: Two times a day (BID) | ORAL | Status: DC
  Administered 2024-08-14 – 2024-08-15 (×3): 100 mg via ORAL
  Filled 2024-08-14 (×3): qty 1

## 2024-08-14 MED ORDER — MIDAZOLAM HCL 2 MG/2ML IJ SOLN
INTRAMUSCULAR | Status: AC
  Filled 2024-08-14: qty 2

## 2024-08-14 MED ORDER — ONDANSETRON HCL 4 MG/2ML IJ SOLN
INTRAMUSCULAR | Status: DC | PRN
  Administered 2024-08-14: 4 mg via INTRAVENOUS

## 2024-08-14 MED ORDER — METOCLOPRAMIDE HCL 5 MG PO TABS: 5.0000 mg | ORAL_TABLET | Freq: Three times a day (TID) | ORAL | Status: DC | PRN

## 2024-08-14 MED ORDER — INSULIN ASPART 100 UNIT/ML IJ SOLN: 0.0000 [IU] | INTRAMUSCULAR | Status: DC | PRN

## 2024-08-14 MED ORDER — SODIUM CHLORIDE 0.9 % IR SOLN
Status: DC | PRN
  Administered 2024-08-14: 1000 mL

## 2024-08-14 MED ORDER — PANTOPRAZOLE SODIUM 40 MG PO TBEC: 40.0000 mg | DELAYED_RELEASE_TABLET | Freq: Every day | ORAL | Status: DC

## 2024-08-14 MED ORDER — ONDANSETRON HCL 4 MG PO TABS: 4.0000 mg | ORAL_TABLET | Freq: Four times a day (QID) | ORAL | Status: DC | PRN

## 2024-08-14 MED ORDER — LACTATED RINGERS IV SOLN: INTRAVENOUS | Status: DC

## 2024-08-14 MED ORDER — MIDAZOLAM HCL 2 MG/2ML IJ SOLN
INTRAMUSCULAR | Status: DC | PRN
  Administered 2024-08-14: 2 mg via INTRAVENOUS

## 2024-08-14 MED ORDER — PHENYLEPHRINE HCL-NACL 20-0.9 MG/250ML-% IV SOLN
INTRAVENOUS | Status: DC | PRN
  Administered 2024-08-14: 75 ug/min via INTRAVENOUS

## 2024-08-14 MED ORDER — TRANEXAMIC ACID-NACL 1000-0.7 MG/100ML-% IV SOLN
1000.0000 mg | INTRAVENOUS | Status: AC
  Administered 2024-08-14: 1000 mg via INTRAVENOUS
  Filled 2024-08-14: qty 100

## 2024-08-14 MED ORDER — HYDROMORPHONE HCL 1 MG/ML IJ SOLN: 0.2500 mg | INTRAMUSCULAR | Status: DC | PRN

## 2024-08-14 MED ORDER — CEFAZOLIN SODIUM-DEXTROSE 2-4 GM/100ML-% IV SOLN
2.0000 g | Freq: Four times a day (QID) | INTRAVENOUS | Status: AC
  Administered 2024-08-14 (×2): 2 g via INTRAVENOUS
  Filled 2024-08-14 (×2): qty 100

## 2024-08-14 MED ORDER — MAGNESIUM CITRATE PO SOLN: 1.0000 | Freq: Once | ORAL | Status: DC | PRN

## 2024-08-14 MED ORDER — HYDROCODONE-ACETAMINOPHEN 7.5-325 MG PO TABS
ORAL_TABLET | ORAL | Status: AC
  Filled 2024-08-14: qty 1

## 2024-08-14 MED ORDER — DIPHENHYDRAMINE HCL 12.5 MG/5ML PO ELIX: 25.0000 mg | ORAL_SOLUTION | ORAL | Status: DC | PRN

## 2024-08-14 MED ORDER — METHOCARBAMOL 500 MG PO TABS
ORAL_TABLET | ORAL | Status: AC
  Filled 2024-08-14: qty 1

## 2024-08-14 MED ORDER — ALLOPURINOL 100 MG PO TABS
100.0000 mg | ORAL_TABLET | Freq: Every day | ORAL | Status: DC
  Administered 2024-08-14 – 2024-08-15 (×2): 100 mg via ORAL
  Filled 2024-08-14 (×2): qty 1

## 2024-08-14 MED ORDER — DEXAMETHASONE SODIUM PHOSPHATE 10 MG/ML IJ SOLN
INTRAMUSCULAR | Status: DC | PRN
  Administered 2024-08-14: 10 mg via INTRAVENOUS

## 2024-08-14 MED ORDER — PROPOFOL 10 MG/ML IV BOLUS
INTRAVENOUS | Status: AC
  Filled 2024-08-14: qty 20

## 2024-08-14 MED ORDER — ORAL CARE MOUTH RINSE: 15.0000 mL | Freq: Once | OROMUCOSAL | Status: AC

## 2024-08-14 MED ORDER — FENTANYL CITRATE (PF) 100 MCG/2ML IJ SOLN
INTRAMUSCULAR | Status: AC
  Filled 2024-08-14: qty 2

## 2024-08-14 MED ORDER — MENTHOL 3 MG MT LOZG: 1.0000 | LOZENGE | OROMUCOSAL | Status: DC | PRN

## 2024-08-14 MED ORDER — ONDANSETRON HCL 4 MG/2ML IJ SOLN
INTRAMUSCULAR | Status: AC
  Filled 2024-08-14: qty 2

## 2024-08-14 MED ORDER — DEXAMETHASONE SODIUM PHOSPHATE 10 MG/ML IJ SOLN: 10.0000 mg | Freq: Once | INTRAMUSCULAR | Status: DC

## 2024-08-14 MED ORDER — LIDOCAINE HCL (PF) 2 % IJ SOLN
INTRAMUSCULAR | Status: AC
  Filled 2024-08-14: qty 5

## 2024-08-14 MED ORDER — DEXAMETHASONE SOD PHOSPHATE PF 10 MG/ML IJ SOLN
10.0000 mg | Freq: Once | INTRAMUSCULAR | Status: AC
  Administered 2024-08-15: 10 mg via INTRAVENOUS

## 2024-08-14 MED ORDER — PHENYLEPHRINE HCL (PRESSORS) 10 MG/ML IV SOLN
INTRAVENOUS | Status: AC
  Filled 2024-08-14: qty 1

## 2024-08-14 MED ORDER — ONDANSETRON HCL 4 MG/2ML IJ SOLN: 4.0000 mg | Freq: Four times a day (QID) | INTRAMUSCULAR | Status: DC | PRN

## 2024-08-14 MED ORDER — EPHEDRINE SULFATE-NACL 50-0.9 MG/10ML-% IV SOSY
PREFILLED_SYRINGE | INTRAVENOUS | Status: DC | PRN
  Administered 2024-08-14: 10 mg via INTRAVENOUS

## 2024-08-14 MED ORDER — PANTOPRAZOLE SODIUM 40 MG PO TBEC
40.0000 mg | DELAYED_RELEASE_TABLET | Freq: Every day | ORAL | Status: DC
  Administered 2024-08-15: 40 mg via ORAL
  Filled 2024-08-14 (×2): qty 1

## 2024-08-14 MED ORDER — AMLODIPINE BESYLATE 5 MG PO TABS
5.0000 mg | ORAL_TABLET | Freq: Every day | ORAL | Status: DC
Start: 2024-08-15 — End: 2024-08-15
  Administered 2024-08-15: 5 mg via ORAL
  Filled 2024-08-14: qty 1

## 2024-08-14 MED ORDER — 0.9 % SODIUM CHLORIDE (POUR BTL) OPTIME
TOPICAL | Status: DC | PRN
  Administered 2024-08-14: 1000 mL

## 2024-08-14 MED ORDER — HYDROCODONE-ACETAMINOPHEN 7.5-325 MG PO TABS
1.0000 | ORAL_TABLET | Freq: Three times a day (TID) | ORAL | Status: DC | PRN
  Administered 2024-08-14: 1 via ORAL

## 2024-08-14 MED ORDER — ASPIRIN 81 MG PO CHEW
81.0000 mg | CHEWABLE_TABLET | Freq: Two times a day (BID) | ORAL | Status: DC
  Administered 2024-08-14 – 2024-08-15 (×2): 81 mg via ORAL
  Filled 2024-08-14 (×2): qty 1

## 2024-08-14 MED ORDER — CEFAZOLIN SODIUM-DEXTROSE 2-4 GM/100ML-% IV SOLN
2.0000 g | INTRAVENOUS | Status: AC
Start: 2024-08-14 — End: 2024-08-14
  Administered 2024-08-14: 2 g via INTRAVENOUS
  Filled 2024-08-14: qty 100

## 2024-08-14 MED ORDER — HYDROCODONE-ACETAMINOPHEN 5-325 MG PO TABS
1.0000 | ORAL_TABLET | Freq: Three times a day (TID) | ORAL | Status: DC | PRN
  Administered 2024-08-14 – 2024-08-15 (×2): 2 via ORAL
  Filled 2024-08-14 (×2): qty 1
  Filled 2024-08-14: qty 2

## 2024-08-14 MED ORDER — CHLORHEXIDINE GLUCONATE 0.12 % MT SOLN
15.0000 mL | Freq: Once | OROMUCOSAL | Status: AC
  Administered 2024-08-14: 15 mL via OROMUCOSAL

## 2024-08-14 MED ORDER — ACETAMINOPHEN 500 MG PO TABS
1000.0000 mg | ORAL_TABLET | Freq: Once | ORAL | Status: DC
  Filled 2024-08-14: qty 2

## 2024-08-14 MED ORDER — DIPHENHYDRAMINE HCL 25 MG PO CAPS
25.0000 mg | ORAL_CAPSULE | Freq: Four times a day (QID) | ORAL | Status: DC | PRN
  Administered 2024-08-14 – 2024-08-15 (×2): 25 mg via ORAL
  Filled 2024-08-14 (×2): qty 1

## 2024-08-14 MED ORDER — ROCURONIUM BROMIDE 10 MG/ML (PF) SYRINGE
PREFILLED_SYRINGE | INTRAVENOUS | Status: AC
  Filled 2024-08-14: qty 10

## 2024-08-14 MED ORDER — MORPHINE SULFATE (PF) 2 MG/ML IV SOLN
0.5000 mg | Freq: Four times a day (QID) | INTRAVENOUS | Status: DC | PRN
  Administered 2024-08-14 (×2): 1 mg via INTRAVENOUS
  Filled 2024-08-14 (×2): qty 1

## 2024-08-14 MED ORDER — VANCOMYCIN HCL 1 G IV SOLR
INTRAVENOUS | Status: DC | PRN
  Administered 2024-08-14: 1000 mg via TOPICAL

## 2024-08-14 MED ORDER — TRANEXAMIC ACID 1000 MG/10ML IV SOLN
2000.0000 mg | INTRAVENOUS | Status: DC
  Filled 2024-08-14: qty 20

## 2024-08-14 MED ORDER — ISOPROPYL ALCOHOL 70 % SOLN
Status: DC | PRN
  Administered 2024-08-14: 1 via TOPICAL

## 2024-08-14 MED ORDER — ACETAMINOPHEN 325 MG PO TABS: 325.0000 mg | ORAL_TABLET | Freq: Four times a day (QID) | ORAL | Status: DC | PRN

## 2024-08-14 MED ORDER — TRANEXAMIC ACID-NACL 1000-0.7 MG/100ML-% IV SOLN
1000.0000 mg | Freq: Once | INTRAVENOUS | Status: AC
Start: 2024-08-14 — End: 2024-08-14
  Administered 2024-08-14: 1000 mg via INTRAVENOUS
  Filled 2024-08-14: qty 100

## 2024-08-14 MED ORDER — PHENOL 1.4 % MT LIQD: 1.0000 | OROMUCOSAL | Status: DC | PRN

## 2024-08-14 MED ORDER — BUPIVACAINE-MELOXICAM ER 200-6 MG/7ML IJ SOLN
INTRAMUSCULAR | Status: DC | PRN
  Administered 2024-08-14: 400 mg

## 2024-08-14 MED ORDER — METHOCARBAMOL 1000 MG/10ML IJ SOLN: 500.0000 mg | Freq: Four times a day (QID) | INTRAMUSCULAR | Status: DC | PRN

## 2024-08-14 MED ORDER — PROPOFOL 1000 MG/100ML IV EMUL
INTRAVENOUS | Status: AC
  Filled 2024-08-14: qty 100

## 2024-08-14 MED ORDER — PHENYLEPHRINE 80 MCG/ML (10ML) SYRINGE FOR IV PUSH (FOR BLOOD PRESSURE SUPPORT)
PREFILLED_SYRINGE | INTRAVENOUS | Status: DC | PRN
  Administered 2024-08-14 (×2): 160 ug via INTRAVENOUS

## 2024-08-14 MED ORDER — BUPIVACAINE IN DEXTROSE 0.75-8.25 % IT SOLN
INTRATHECAL | Status: DC | PRN
  Administered 2024-08-14: 1.7 mL via INTRATHECAL

## 2024-08-14 MED ORDER — POVIDONE-IODINE 10 % EX SWAB: 2.0000 | Freq: Once | CUTANEOUS | Status: DC

## 2024-08-14 MED ORDER — POLYETHYLENE GLYCOL 3350 17 G PO PACK
17.0000 g | PACK | Freq: Every day | ORAL | Status: DC
  Administered 2024-08-14 – 2024-08-15 (×2): 17 g via ORAL
  Filled 2024-08-14 (×2): qty 1

## 2024-08-14 MED ORDER — TRANEXAMIC ACID 1000 MG/10ML IV SOLN
INTRAVENOUS | Status: DC | PRN
  Administered 2024-08-14: 2000 mg via TOPICAL

## 2024-08-14 MED ORDER — METOCLOPRAMIDE HCL 5 MG/ML IJ SOLN: 5.0000 mg | Freq: Three times a day (TID) | INTRAMUSCULAR | Status: DC | PRN

## 2024-08-14 MED ORDER — INSULIN ASPART 100 UNIT/ML IJ SOLN
0.0000 [IU] | Freq: Three times a day (TID) | INTRAMUSCULAR | Status: DC
  Administered 2024-08-14: 5 [IU] via SUBCUTANEOUS
  Administered 2024-08-15 (×2): 3 [IU] via SUBCUTANEOUS

## 2024-08-14 MED ORDER — METHOCARBAMOL 500 MG PO TABS
500.0000 mg | ORAL_TABLET | Freq: Four times a day (QID) | ORAL | Status: DC | PRN
  Administered 2024-08-14 – 2024-08-15 (×2): 500 mg via ORAL
  Filled 2024-08-14: qty 1

## 2024-08-14 MED ORDER — ALUM & MAG HYDROXIDE-SIMETH 200-200-20 MG/5ML PO SUSP: 30.0000 mL | ORAL | Status: DC | PRN

## 2024-08-14 MED ORDER — PROPOFOL 500 MG/50ML IV EMUL
INTRAVENOUS | Status: DC | PRN
  Administered 2024-08-14: 70 ug/kg/min via INTRAVENOUS

## 2024-08-14 MED ORDER — INSULIN ASPART 100 UNIT/ML IJ SOLN
0.0000 [IU] | Freq: Every day | INTRAMUSCULAR | Status: DC
  Administered 2024-08-14: 3 [IU] via SUBCUTANEOUS

## 2024-08-14 MED ORDER — SODIUM CHLORIDE 0.9 % IV SOLN: INTRAVENOUS | Status: DC

## 2024-08-14 MED ORDER — VANCOMYCIN HCL 1000 MG IV SOLR
INTRAVENOUS | Status: AC
  Filled 2024-08-14: qty 20

## 2024-08-14 MED ORDER — BUPIVACAINE-MELOXICAM ER 400-12 MG/14ML IJ SOLN
INTRAMUSCULAR | Status: AC
Start: 2024-08-14 — End: 2024-08-14
  Filled 2024-08-14: qty 1

## 2024-08-14 MED ORDER — DEXAMETHASONE SODIUM PHOSPHATE 10 MG/ML IJ SOLN
INTRAMUSCULAR | Status: AC
  Filled 2024-08-14: qty 1

## 2024-08-14 MED ORDER — DOXYCYCLINE HYCLATE 100 MG PO TABS
100.0000 mg | ORAL_TABLET | Freq: Two times a day (BID) | ORAL | Status: DC
  Administered 2024-08-14 – 2024-08-15 (×2): 100 mg via ORAL
  Filled 2024-08-14 (×2): qty 1

## 2024-08-14 MED ORDER — ACETAMINOPHEN 500 MG PO TABS: 1000.0000 mg | ORAL_TABLET | Freq: Four times a day (QID) | ORAL | Status: AC

## 2024-08-14 SURGICAL SUPPLY — 45 items
BAG COUNTER SPONGE SURGICOUNT (BAG) IMPLANT
BAG ZIPLOCK 12X15 (MISCELLANEOUS) ×2 IMPLANT
BLADE SAG 18X100X1.27 (BLADE) ×2 IMPLANT
CLSR STERI-STRIP ANTIMIC 1/2X4 (GAUZE/BANDAGES/DRESSINGS) IMPLANT
COVER PERINEAL POST (MISCELLANEOUS) ×2 IMPLANT
COVER SURGICAL LIGHT HANDLE (MISCELLANEOUS) ×2 IMPLANT
CUP ACET EMPHA 48 CEMENTLESS (Cup) IMPLANT
DERMABOND ADVANCED .7 DNX12 (GAUZE/BANDAGES/DRESSINGS) IMPLANT
DRAPE FOOT SWITCH (DRAPES) ×2 IMPLANT
DRAPE IMP U-DRAPE 54X76 (DRAPES) ×2 IMPLANT
DRAPE POUCH INSTRU U-SHP 10X18 (DRAPES) ×2 IMPLANT
DRAPE STERI IOBAN 125X83 (DRAPES) ×2 IMPLANT
DRAPE TOP 10253 STERILE (DRAPES) ×4 IMPLANT
DRAPE U-SHAPE 47X51 STRL (DRAPES) ×2 IMPLANT
DRESSING AQUACEL AG SP 3.5X6 (GAUZE/BANDAGES/DRESSINGS) ×2 IMPLANT
DRSG AQUACEL AG ADV 3.5X10 (GAUZE/BANDAGES/DRESSINGS) ×2 IMPLANT
DURAPREP 26ML APPLICATOR (WOUND CARE) ×4 IMPLANT
ELECT PENCIL ROCKER SW 15FT (MISCELLANEOUS) ×2 IMPLANT
ELECT REM PT RETURN 15FT ADLT (MISCELLANEOUS) ×2 IMPLANT
GLOVE BIOGEL PI IND STRL 7.0 (GLOVE) ×2 IMPLANT
GLOVE BIOGEL PI IND STRL 7.5 (GLOVE) ×2 IMPLANT
GLOVE ECLIPSE 7.0 STRL STRAW (GLOVE) ×6 IMPLANT
GLOVE SURG SYN 7.5 PF PI (GLOVE) ×4 IMPLANT
GOWN SRG XL LVL 4 BRTHBL STRL (GOWNS) ×4 IMPLANT
HEAD CERAMIC 36 PLUS 8.5 12 14 (Hips) IMPLANT
HOOD PEEL AWAY T7 (MISCELLANEOUS) ×6 IMPLANT
KIT TURNOVER KIT A (KITS) ×2 IMPLANT
LINER ACET EMPHA 36X48 AOX POL (Liner) IMPLANT
MARKER SKIN DUAL TIP RULER LAB (MISCELLANEOUS) ×2 IMPLANT
NDL SPNL 18GX3.5 QUINCKE PK (NEEDLE) ×2 IMPLANT
NEEDLE SPNL 18GX3.5 QUINCKE PK (NEEDLE) ×1 IMPLANT
PACK ANTERIOR HIP CUSTOM (KITS) ×2 IMPLANT
SCREW 6.5MMX30MM (Screw) IMPLANT
SET HNDPC FAN SPRY TIP SCT (DISPOSABLE) ×2 IMPLANT
SOLUTION PRONTOSAN WOUND 350ML (IRRIGATION / IRRIGATOR) ×2 IMPLANT
STEM FEM ACTIS HIGH SZ3 (Stem) IMPLANT
SUT ETHIBOND 2 V 37 (SUTURE) ×2 IMPLANT
SUT ETHILON 2 0 PS N (SUTURE) ×2 IMPLANT
SUT MNCRL AB 3-0 PS2 18 (SUTURE) IMPLANT
SUT NYLON 3 0 (SUTURE) IMPLANT
SUT STRATAFIX PDS+ 0 24IN (SUTURE) ×2 IMPLANT
SUT VIC AB 0 CT1 36 (SUTURE) IMPLANT
SUT VIC AB 2-0 CT1 TAPERPNT 27 (SUTURE) ×4 IMPLANT
TRAY CATH INTERMITTENT SS 16FR (CATHETERS) IMPLANT
TUBE SUCTION HIGH CAP CLEAR NV (SUCTIONS) ×2 IMPLANT

## 2024-08-14 NOTE — Evaluation (Signed)
 Physical Therapy Evaluation Patient Details Name: Rhonda Morrow MRN: [id number] DOB: 09-24-1961 Today's Date: 08/14/2024  History of Present Illness  Pt s/p R THR and with hx of L THR, DM, and deafness  Clinical Impression  Pt s/p R THR and presents with decreased R LE strength/ROM and post op pain limiting functional mobility.  This date, pt requiring increased time for all tasks including move to EOB sitting and to standing with gait limited to shuffling along EOB 2* instability and c/o SOB - SaO2 100% on RA and BP 127/66.  Pt should progress to dc home with family assist.        If plan is discharge home, recommend the following: A little help with walking and/or transfers;A little help with bathing/dressing/bathroom;Assistance with cooking/housework;Assist for transportation   Can travel by private vehicle        Equipment Recommendations Rolling walker (2 wheels)  Recommendations for Other Services       Functional Status Assessment Patient has had a recent decline in their functional status and demonstrates the ability to make significant improvements in function in a reasonable and predictable amount of time.     Precautions / Restrictions Precautions Precautions: Fall Restrictions Weight Bearing Restrictions Per Provider Order: No Other Position/Activity Restrictions: WBAT      Mobility  Bed Mobility Overal bed mobility: Needs Assistance Bed Mobility: Supine to Sit, Sit to Supine     Supine to sit: Mod assist Sit to supine: Mod assist, +2 for physical assistance, +2 for safety/equipment   General bed mobility comments: Increased time with cues for sequence and use of L LE to self assist; increased assist for return to bed    Transfers Overall transfer level: Needs assistance   Transfers: Sit to/from Stand Sit to Stand: Min assist, Mod assist, +2 physical assistance, From elevated surface           General transfer comment: cues for LE management and use  of UEs to self assist; forward balance loss on standing requiring assist to correct and prevent falling    Ambulation/Gait Ambulation/Gait assistance: Min assist, Mod assist, +2 physical assistance Gait Distance (Feet): 2 Feet Assistive device: Rolling walker (2 wheels) Gait Pattern/deviations: Step-to pattern, Decreased step length - right, Decreased step length - left, Shuffle, Trunk flexed Gait velocity: decr     General Gait Details: Pt side shuffled to top of bed only - ltd by c/o SOB and instability  Stairs            Wheelchair Mobility     Tilt Bed    Modified Rankin (Stroke Patients Only)       Balance Overall balance assessment: Needs assistance Sitting-balance support: No upper extremity supported, Feet supported Sitting balance-Leahy Scale: Good     Standing balance support: Bilateral upper extremity supported Standing balance-Leahy Scale: Poor                               Pertinent Vitals/Pain Pain Assessment Pain Assessment: Faces Faces Pain Scale: Hurts little more Pain Location: R hip Pain Descriptors / Indicators: Aching, Sore Pain Intervention(s): Limited activity within patient's tolerance, Monitored during session, Premedicated before session, Ice applied    Home Living Family/patient expects to be discharged to:: Private residence Living Arrangements: Spouse/significant other;Children;Parent Available Help at Discharge: Family;Available 24 hours/day Type of Home: House Home Access: Level entry       Home Layout: One level Home Equipment: [place] -  single point      Prior Function Prior Level of Function : Independent/Modified Independent                     Extremity/Trunk Assessment   Upper Extremity Assessment Upper Extremity Assessment: Overall WFL for tasks assessed    Lower Extremity Assessment Lower Extremity Assessment: RLE deficits/detail    Cervical / Trunk Assessment Cervical / Trunk  Assessment: Normal  Communication   Communication Communication: Impaired Factors Affecting Communication: Hearing impaired    Cognition Arousal: Alert Behavior During Therapy: WFL for tasks assessed/performed   PT - Cognitive impairments: No apparent impairments, Difficult to assess Difficult to assess due to: Hard of hearing/deaf                       Following commands: Intact       Cueing Cueing Techniques: Other (comments), Visual cues, Gestural cues (sign language via interpreter)     General Comments      Exercises Total Joint Exercises Ankle Circles/Pumps: AROM, Both, 15 reps, Supine   Assessment/Plan    PT Assessment Patient needs continued PT services  PT Problem List Decreased strength;Decreased range of motion;Decreased activity tolerance;Decreased balance;Decreased mobility;Decreased knowledge of use of DME;Pain       PT Treatment Interventions DME instruction;Gait training;Functional mobility training;Therapeutic activities;Therapeutic exercise;Patient/family education    PT Goals (Current goals can be found in the Care Plan section)  Acute Rehab PT Goals Patient Stated Goal: Regain IND PT Goal Formulation: With patient Time For Goal Achievement: 08/21/24 Potential to Achieve Goals: Good    Frequency 7X/week     Co-evaluation               AM-PAC PT 6 Clicks Mobility  Outcome Measure Help needed turning from your back to your side while in a flat bed without using bedrails?: A Lot Help needed moving from lying on your back to sitting on the side of a flat bed without using bedrails?: A Lot Help needed moving to and from a bed to a chair (including a wheelchair)?: A Lot Help needed standing up from a chair using your arms (e.g., wheelchair or bedside chair)?: A Lot Help needed to walk in hospital room?: Total Help needed climbing 3-5 steps with a railing? : Total 6 Click Score: 10    End of Session Equipment Utilized During  Treatment: Gait belt Activity Tolerance: Patient limited by fatigue Patient left: in bed;with call bell/phone within reach;with bed alarm set;with family/visitor present Nurse Communication: Mobility status PT Visit Diagnosis: Difficulty in walking, not elsewhere classified (R26.2)    Time: 8467-8387 PT Time Calculation (min) (ACUTE ONLY): 40 min   Charges:   PT Evaluation $PT Eval Low Complexity: 1 Low PT Treatments $Therapeutic Activity: 23-37 mins PT General Charges $$ ACUTE PT VISIT: 1 Visit         [facility] PT Acute Rehabilitation Services Office [phone]   Yordin Rhoda 08/14/2024, 4:32 PM

## 2024-08-14 NOTE — Transfer of Care (Signed)
 Immediate Anesthesia Transfer of Care Note  Patient: Rhonda Morrow  Procedure(s) Performed: ARTHROPLASTY, HIP, TOTAL, ANTERIOR APPROACH (Right: Hip)  Patient Location: PACU  Anesthesia Type:Spinal  Level of Consciousness: awake, alert , and oriented  Airway & Oxygen  Therapy: Patient Spontanous Breathing and Patient connected to face mask oxygen   Post-op Assessment: Report given to RN and Post -op Vital signs reviewed and stable  Post vital signs: Reviewed and stable  Last Vitals:  Vitals Value Taken Time  BP 141/107 08/14/24 12:45  Temp    Pulse 93 08/14/24 12:46  Resp 21 08/14/24 12:46  SpO2 95 % 08/14/24 12:46  Vitals shown include unfiled device data.  Last Pain:  Vitals:   08/14/24 0926  TempSrc:   PainSc: 0-No pain         Complications: No notable events documented.

## 2024-08-14 NOTE — Discharge Instructions (Signed)

## 2024-08-14 NOTE — Op Note (Signed)
 ARTHROPLASTY, HIP, TOTAL, ANTERIOR APPROACH  Procedure Note Rhonda Morrow   [id number]  Pre-op Diagnosis: right hip osteoarthritis     Post-op Diagnosis: same  Operative Findings Severe OA Coxa valga   Operative Procedures  1. Total hip replacement; Right hip; uncemented [id number]   Surgeon: Kay Cummins, M.D.  Assist: Rhonda Rhonda Grave, PA-C   Anesthesia: spinal  Prosthesis: Depuy Acetabulum: Emphasys 48 mm Femur: Actis 3 HO Head: 36 mm size: +8.5 Liner: +4 neutral Bearing Type: ceramic/poly  Total Hip Arthroplasty (Anterior Approach) Op Note:  After informed consent was obtained and the operative extremity marked in the holding area, the patient was brought back to the operating room and placed supine on the HANA table. Next, the operative extremity was prepped and draped in normal sterile fashion. Surgical timeout occurred verifying patient identification, surgical site, surgical procedure and administration of antibiotics.  Abdominal pannus was taped.  A 10 cm longitudinal incision was made starting from 3 fingerbreadths lateral and 1 inferior to the ASIS towards the lateral aspect of the patella.  A Hueter approach to the hip was performed, using the interval between tensor fascia lata and sartorius.  Dissection was carried bluntly down onto the anterior hip capsule. The lateral femoral circumflex vessels were identified and coagulated. A capsulotomy was performed and the capsular flaps tagged for later repair.  The neck osteotomy was performed 1 fingerbreadth above the lesser trochanter. The femoral head was removed which showed severe OA, the acetabular rim was cleared of soft tissue and osteophytes and attention was turned to reaming the acetabulum.  Sequential reaming was performed under fluoroscopic guidance down to the floor of the cotyloid fossa. We reamed to a size 47 mm, and then impacted the acetabular shell. A 30 mm cancellous screw was placed to secure the shell.   A +4 neutral liner was then placed after irrigation and attention turned to the femur.  After placing the femoral hook, the leg was taken to externally rotated, extended and adducted position taking care to perform soft tissue releases to allow for adequate mobilization of the femur. Soft tissue was cleared from the shoulder of the greater trochanter and the hook elevator used to improve exposure of the proximal femur.  Lateral bone from the shoulder was rasped away for relief.  Sequential broaching performed up to a size 3.  Standard trial neck and +1.5 head were placed. The leg was brought back up to neutral and the construct reduced.  The position and sizing of components, offset and leg lengths were checked using fluoroscopy.  Based on fluoroscopic findings, we chose to retrial with high offset neck and +8.5 head ball.  Stability of the construct was checked in 45 degrees of hip extension and 90 degrees of external rotation without any subluxation, shuck or impingement of prosthesis. We dislocated the prosthesis, dropped the leg back into position, removed trial components, and irrigated copiously. The final stem and head were chosen then placed, the leg brought back up, the system reduced and fluoroscopy used to verify positioning.  Antibiotic irrigation was placed in the surgical wound.   We irrigated, obtained hemostasis and closed the capsule using #2 ethibond suture.  A topical mixture of 0.25% bupivacaine  and meloxicam  was placed deep to the fascia.  One gram of vancomycin powder was placed in the surgical bed.   One gram of topical tranexamic acid  was injected into the joint.  The fascia was closed with #1 stratafix, the deep fat layer was closed with  0 vicryl, the subcutaneous layers closed with 2.0 Vicryl Plus and the skin closed with 2.0 nylon and dermabond. A sterile dressing was applied. The patient was awakened in the operating room and taken to recovery in stable condition.  All sponge, needle,  and instrument counts were correct at the end of the case.   Rhonda Morrow, my PA, was a medical necessity for opening, closing, limb positioning, retracting, exposing, and overall facilitation and timely completion of the surgery.  Position: supine  Complications: see description of procedure.  Time Out: performed   Drains/Packing: none  Estimated blood loss: see anesthesia record  Returned to Recovery Room: in good condition.   Antibiotics: yes   Mechanical VTE (DVT) Prophylaxis: sequential compression devices, TED thigh-high  Chemical VTE (DVT) Prophylaxis: aspirin    Fluid Replacement: see anesthesia record  Specimens Removed: 1 to pathology   Sponge and Instrument Count Correct? yes   PACU: portable radiograph - low AP   Plan/RTC: Return in 2 weeks for suture removal. Weight Bearing/Load Lower Extremity: full  Hip precautions: none Suture Removal: 2 weeks   N. Ozell Cummins, MD [facility] 11:37 AM   Implant Name Type Inv. Item Serial No. Manufacturer Lot No. LRB No. Used Action  SCREW 6.5MMX30MM - [id number] Screw SCREW 6.5MMX30MM  [employer] [id number] Right 1 Implanted  LINER ACET EMPHA 36X48 AOX POL - [id number] Liner LINER ACET EMPHA 36X48 AOX POL  [employer] [id number] Right 1 Implanted  CUP ACET EMPHA 48 CEMENTLESS - [id number] Cup CUP ACET EMPHA 48 CEMENTLESS  [employer] [id number] Right 1 Implanted  STEM FEM ACTIS HIGH SZ3 - [id number] Stem STEM FEM ACTIS HIGH SZ3  [employer] [id number] Right 1 Implanted  HEAD CERAMIC 36 PLUS 8.5 12 14  - [id number] Hips HEAD CERAMIC 36 PLUS 8.5 12 14   [employer] [id number] Right 1 Implanted

## 2024-08-14 NOTE — H&P (Signed)
 PREOPERATIVE H&P  Chief Complaint: right hip osteoarthritis  HPI: Rhonda Morrow is a 63 y.o. female who presents for surgical treatment of right hip osteoarthritis.  She denies any changes in medical history.  Past Surgical History:  Procedure Laterality Date   CARDIAC CATHETERIZATION     CESAREAN SECTION     CHOLECYSTECTOMY N/A 06/20/2016   Procedure: LAPAROSCOPIC CHOLECYSTECTOMY;  Surgeon: Donnice Bury, MD;  Location: [facility] OR;  Service: General;  Laterality: N/A;   ESOPHAGEAL MANOMETRY N/A 09/29/2013   Procedure: ESOPHAGEAL MANOMETRY (EM);  Surgeon: Toribio SHAUNNA Cedar, MD;  Location: [facility] ENDOSCOPY;  Service: Endoscopy;  Laterality: N/A;   TOTAL HIP ARTHROPLASTY Left 02/21/2017   Procedure: LEFT TOTAL HIP ARTHROPLASTY ANTERIOR APPROACH;  Surgeon: Kay CHRISTELLA Cummins, MD;  Location: [facility] OR;  Service: Orthopedics;  Laterality: Left;   Social History   Socioeconomic History   Marital status: Married    Spouse name: Not on file   Number of children: Not on file   Years of education: Not on file   Highest education level: Not on file  Occupational History   Occupation: disabled  Tobacco Use   Smoking status: Never   Smokeless tobacco: Never  Vaping Use   Vaping status: Never Used  Substance and Sexual Activity   Alcohol use: Yes    Alcohol/week: 1.0 standard drink of alcohol    Types: 1 Glasses of wine per week    Comment: occas   Drug use: No   Sexual activity: Not Currently  Other Topics Concern   Not on file  Social History Narrative   Lives with family.  Has 3 children.  Works for [employer]  Education: high school.   Social Drivers of Corporate investment banker Strain: Not on file  Food Insecurity: Not on file  Transportation Needs: Not on file  Physical Activity: Not on file  Stress: Not on file  Social Connections: Unknown (03/18/2022)   [facility]   Social Network    Social Network: Not on file   Family History  Problem Relation Age of Onset    Diabetes Mother    Heart disease Mother    Diabetes Father    Colon cancer Neg Hx    Colon polyps Neg Hx    Esophageal cancer Neg Hx    Rectal cancer Neg Hx    Stomach cancer Neg Hx    Allergies  Allergen Reactions   Losartan  Shortness Of Breath   Oxycodone -Acetaminophen  Nausea And Vomiting   Augmentin  [Amoxicillin -Pot Clavulanate] Diarrhea   Prior to Admission medications   Medication Sig Start Date End Date Taking? Authorizing Provider  albuterol  (VENTOLIN  HFA) 108 (90 Base) MCG/ACT inhaler Inhale 1-2 puffs into the lungs every 6 (six) hours as needed for wheezing or shortness of breath. 07/18/24  Yes Antonio Cyndee Rockers R, DO  allopurinol  (ZYLOPRIM ) 100 MG tablet Take 1 tablet (100 mg total) by mouth daily. 05/14/24  Yes Antonio Cyndee Rockers R, DO  amLODipine  (NORVASC ) 5 MG tablet Take 1 tablet (5 mg total) by mouth daily. 04/14/24  Yes Antonio Cyndee Rockers R, DO  aspirin  (ASPIRIN  81) 81 MG chewable tablet Chew 1 tablet (81 mg total) by mouth 2 (two) times daily. To be taken after surgery to prevent blood clots 08/13/24   Jule Ronal CROME, PA-C  atorvastatin  (LIPITOR) 20 MG tablet TAKE 1 TABLET BY MOUTH EVERY DAY 04/01/24  Yes Lowne Chase, Yvonne R, DO  cetirizine  (ZYRTEC ) 10 MG tablet TAKE  1 TABLET BY MOUTH EVERY DAY 02/13/24  Yes Antonio Cyndee Jamee JONELLE, DO  diclofenac  (VOLTAREN ) 75 MG EC tablet Take 1 tablet (75 mg total) by mouth 2 (two) times daily. 06/13/24  Yes Saguier, Dallas, PA-C  docusate sodium  (COLACE) 100 MG capsule Take 1 capsule (100 mg total) by mouth daily as needed. 08/13/24 08/13/25  Jule Ronal CROME, PA-C  doxycycline  (VIBRAMYCIN ) 100 MG capsule Take 1 capsule (100 mg total) by mouth 2 (two) times daily. To be taken after surgery 08/13/24   Jule Ronal CROME, PA-C  fluticasone  (FLONASE ) 50 MCG/ACT nasal spray Place 2 sprays into both nostrils daily. Patient taking differently: Place 2 sprays into both nostrils daily as needed for allergies. 02/05/24  Yes Antonio Cyndee Jamee R, DO   Fluticasone -Umeclidin-Vilant (TRELEGY ELLIPTA ) 100-62.5-25 MCG/ACT AEPB Inhale 1 puff into the lungs daily. 03/13/24  Yes Parrett, Tammy S, NP  HYDROcodone -acetaminophen  (NORCO) 7.5-325 MG tablet Take 1-2 tablets by mouth every 6 (six) hours as needed for moderate pain (pain score 4-6). 08/13/24   Jule Ronal CROME, PA-C  meclizine  (ANTIVERT ) 25 MG tablet Take 1 tablet (25 mg total) by mouth 3 (three) times daily as needed for dizziness. 07/21/24  Yes Saguier, Dallas, PA-C  metFORMIN  (GLUCOPHAGE -XR) 500 MG 24 hr tablet Take 2 tablets (1,000 mg total) by mouth daily with breakfast. 06/27/24  Yes Antonio Cyndee, Yvonne R, DO  methocarbamol  (ROBAXIN ) 750 MG tablet Take 1 tablet (750 mg total) by mouth 3 (three) times daily as needed. 08/13/24   Jule Ronal CROME, PA-C  omeprazole  (PRILOSEC) 20 MG capsule Take 20 mg by mouth in the morning and at bedtime.   Yes [provider]  ondansetron  (ZOFRAN ) 4 MG tablet Take 1 tablet (4 mg total) by mouth every 8 (eight) hours as needed for nausea or vomiting. 08/13/24   Jule Ronal CROME, PA-C  potassium chloride  (KLOR-CON ) 10 MEQ tablet Take 1 tablet (10 mEq total) by mouth 2 (two) times daily. 06/27/24  Yes Antonio Cyndee Jamee R, DO  Semaglutide  (RYBELSUS ) 3 MG TABS Take 3 mg by mouth daily.   Yes [provider]  solifenacin  (VESICARE ) 5 MG tablet TAKE 1 TABLET (5 MG TOTAL) BY MOUTH DAILY. Patient taking differently: Take 5 mg by mouth daily as needed (bladder control). 04/01/24  Yes Antonio Cyndee Jamee R, DO  Azelastine  HCl 137 MCG/SPRAY SOLN PLACE 2 SPRAYS INTO BOTH NOSTRILS 2 (TWO) TIMES DAILY. USE IN EACH NOSTRIL AS DIRECTED Patient taking differently: as needed. 06/05/22   Antonio Cyndee Jamee R, DO  blood glucose meter kit and supplies KIT Dispense based on patient and insurance preference. Use up to four times daily as directed. (FOR ICD-9 250.00, 250.01). 12/05/21   Almarie Waddell NOVAK, NP  Na Sulfate-K Sulfate-Mg Sulfate concentrate (SUPREP) 17.5-3.13-1.6  GM/177ML SOLN Take 1 kit by mouth See admin instructions.    [provider]  Semaglutide  (RYBELSUS ) 7 MG TABS Take 1 tablet (7 mg total) by mouth daily. 07/22/24   Saguier, Dallas, PA-C     Positive ROS: All other systems have been reviewed and were otherwise negative with the exception of those mentioned in the HPI and as above.  Physical Exam: General: Alert, no acute distress Cardiovascular: No pedal edema Respiratory: No cyanosis, no use of accessory musculature GI: abdomen soft Skin: No lesions in the area of chief complaint Neurologic: Sensation intact distally Psychiatric: Patient is competent for consent with normal mood and affect Lymphatic: no lymphedema  MUSCULOSKELETAL: exam stable  Assessment: right hip  osteoarthritis  Plan: Plan for Procedure(s): ARTHROPLASTY, HIP, TOTAL, ANTERIOR APPROACH  The risks benefits and alternatives were discussed with the patient including but not limited to the risks of nonoperative treatment, versus surgical intervention including infection, bleeding, nerve injury,  blood clots, cardiopulmonary complications, morbidity, mortality, among others, and they were willing to proceed.   Ozell Cummins, MD 08/14/2024 9:00 AM

## 2024-08-14 NOTE — Anesthesia Postprocedure Evaluation (Signed)
 Anesthesia Post Note  Patient: Rhonda Morrow  Procedure(s) Performed: ARTHROPLASTY, HIP, TOTAL, ANTERIOR APPROACH (Right: Hip)     Patient location during evaluation: PACU Anesthesia Type: General Level of consciousness: awake and alert Pain management: pain level controlled Vital Signs Assessment: post-procedure vital signs reviewed and stable Respiratory status: spontaneous breathing, nonlabored ventilation and respiratory function stable Cardiovascular status: blood pressure returned to baseline Postop Assessment: no apparent nausea or vomiting Anesthetic complications: no   No notable events documented.  Last Vitals:  Vitals:   08/14/24 1300 08/14/24 1315  BP: 135/73 (!) 124/93  Pulse: 84 80  Resp: (!) 26 (!) 21  Temp:    SpO2: 95% 96%    Last Pain:  Vitals:   08/14/24 1329  TempSrc:   PainSc: 7                  Vertell Row

## 2024-08-14 NOTE — Anesthesia Preprocedure Evaluation (Addendum)
 Anesthesia Evaluation  Patient identified by MRN, date of birth, ID band Patient awake    Reviewed: Allergy & Precautions, H&P , NPO status , Patient's Chart, lab work & pertinent test results  History of Anesthesia Complications Negative for: history of anesthetic complications  Airway Mallampati: II  TM Distance: >3 FB Neck ROM: Full    Dental no notable dental hx.    Pulmonary asthma , sleep apnea    Pulmonary exam normal        Cardiovascular hypertension, Pt. on medications + Peripheral Vascular Disease  Normal cardiovascular exam     Neuro/Psych    Depression    negative neurological ROS     GI/Hepatic Neg liver ROS,GERD  Medicated and Controlled,,  Endo/Other  diabetes, Type 2  Class 3 obesity  Renal/GU negative Renal ROS  negative genitourinary   Musculoskeletal  (+) Arthritis , Osteoarthritis,    Abdominal   Peds  Hematology negative hematology ROS (+)   Anesthesia Other Findings   Reproductive/Obstetrics                              Anesthesia Physical Anesthesia Plan  ASA: 2  Anesthesia Plan: General   Post-op Pain Management: Tylenol  PO (pre-op)*   Induction: Intravenous  PONV Risk Score and Plan: 4 or greater and Ondansetron , Dexamethasone , Propofol  infusion, TIVA and Midazolam   Airway Management Planned: LMA  Additional Equipment: None  Intra-op Plan:   Post-operative Plan: Extubation in OR  Informed Consent: I have reviewed the patients History and Physical, chart, labs and discussed the procedure including the risks, benefits and alternatives for the proposed anesthesia with the patient or authorized representative who has indicated his/her understanding and acceptance.     Dental advisory given and Interpreter used for interview  Plan Discussed with: CRNA  Anesthesia Plan Comments: (Discussed spinal vs GA and patient prefers GA. In person ASL  interpreter used. Lawence, MD)         Anesthesia Quick Evaluation

## 2024-08-15 ENCOUNTER — Other Ambulatory Visit: Payer: Self-pay | Admitting: Physician Assistant

## 2024-08-15 ENCOUNTER — Encounter (HOSPITAL_COMMUNITY): Payer: Self-pay | Admitting: Orthopaedic Surgery

## 2024-08-15 DIAGNOSIS — M1611 Unilateral primary osteoarthritis, right hip: Secondary | ICD-10-CM | POA: Diagnosis not present

## 2024-08-15 LAB — GLUCOSE, CAPILLARY
Glucose-Capillary: 199 mg/dL — ABNORMAL HIGH (ref 70–99)
Glucose-Capillary: 200 mg/dL — ABNORMAL HIGH (ref 70–99)

## 2024-08-15 NOTE — TOC Transition Note (Signed)
 Transition of Care [facility]) - Discharge Note   Patient Details  Name: Rhonda Morrow MRN: [id number] Date of Birth: [birth year]  Transition of Care [facility]) CM/SW Contact:  Heather DELENA Saltness, LCSW Phone Number: 08/15/2024, 9:59 AM   Clinical Narrative:    Pt discharging home with [facility] PT services through [facility]. Pt reports needing RW. RW ordered through [facility]. CSW spoke to [place] who confirms RW will be delivered to bedside. No further TOC needs at this time.   Final next level of care: Home w Home Health Services Barriers to Discharge: Continued Medical Work up, Barriers Resolved   Patient Goals and CMS Choice Patient states their goals for this hospitalization and ongoing recovery are:: To return home        Discharge Placement  Home              Patient to be transferred to facility by: Family Name of family member notified: Patient Patient and family notified of of transfer: 08/15/24  Discharge Plan and Services Additional resources added to the After Visit Summary for  [facility]                DME Arranged: Vannie rolling DME Agency: [employer] Date DME Agency Contacted: 08/15/24 Time DME Agency Contacted: [phone] Representative spoke with at DME Agency: [facility] HH Arranged: PT HH Agency: [facility] ([facility]) Date HH Agency Contacted: 08/15/24 Time HH Agency Contacted: [phone] Representative spoke with at [facility] Agency: Pre-arranged  Social Drivers of Health (SDOH) Interventions SDOH Screenings   Food Insecurity: No Food Insecurity (08/14/2024)  Housing: Low Risk  (08/14/2024)  Transportation Needs: No Transportation Needs (08/14/2024)  Utilities: Not At Risk (08/14/2024)  Depression (PHQ2-9): Medium Risk (06/13/2024)  Social Connections: Unknown (03/18/2022)   Received from [facility]  Tobacco Use: Low Risk  (08/14/2024)     Readmission Risk Interventions     No data to display           Signed: Heather Saltness, MSW, LCSW Clinical Social  Worker Inpatient Care Management 08/15/2024 9:59 AM

## 2024-08-15 NOTE — Progress Notes (Signed)
 Subjective: 1 Day Post-Op Procedure(s) (LRB): ARTHROPLASTY, HIP, TOTAL, ANTERIOR APPROACH (Right) Patient reports pain as mild.    Objective: Vital signs in last 24 hours: Temp:  [97.2 F (36.2 C)-97.7 F (36.5 C)] 97.7 F (36.5 C) (10/10 0540) Pulse Rate:  [69-95] 77 (10/10 0926) Resp:  [15-27] 16 (10/10 0540) BP: (102-145)/(40-107) 135/53 (10/10 0926) SpO2:  [94 %-100 %] 100 % (10/10 0926)  Intake/Output from previous day: 10/09 0701 - 10/10 0700 In: 2086.1 [P.O.:300; I.V.:1486.1; IV Piggyback:300] Out: 500 [Urine:300; Blood:200] Intake/Output this shift: No intake/output data recorded.  Recent Labs    08/14/24 1241  HGB 13.5   Recent Labs    08/14/24 1241  WBC 10.9*  RBC 5.01  HCT 44.7  PLT 166   No results for input(s): NA, K, CL, CO2, BUN, CREATININE, GLUCOSE, CALCIUM  in the last 72 hours. No results for input(s): LABPT, INR in the last 72 hours.  Neurologically intact Neurovascular intact Sensation intact distally Intact pulses distally Dorsiflexion/Plantar flexion intact Incision: dressing C/D/I No cellulitis present Compartment soft   Assessment/Plan: 1 Day Post-Op Procedure(s) (LRB): ARTHROPLASTY, HIP, TOTAL, ANTERIOR APPROACH (Right) Advance diet Up with therapy D/C IV fluids Discharge home with home health once cleared by PT and able to urinate on her own WBAT RLE Post-op meds sent to pharmacy prior to surgery      Ronal LITTIE Grave 08/15/2024, 10:03 AM

## 2024-08-15 NOTE — Discharge Summary (Signed)
 Patient ID: Rhonda Morrow MRN: [id number] DOB/AGE: [birth year] 63 y.o.  Admit date: 08/14/2024 Discharge date: 08/15/2024  Admission Diagnoses:  Principal Problem:   Status post total replacement of right hip Active Problems:   Unilateral primary osteoarthritis, right hip   Discharge Diagnoses:  Same  Past Medical History:  Diagnosis Date   Arthritis    Asthma    Complication of anesthesia    one time woke up and was vey scared,17 yrs ago   Deaf    Depression    Diabetes mellitus without complication (HCC)    GERD (gastroesophageal reflux disease)    Hypertension    Left groin pain    Nausea with vomiting 02/19/2014   Thyroid  disease     Surgeries: Procedure(s): ARTHROPLASTY, HIP, TOTAL, ANTERIOR APPROACH on 08/14/2024   Consultants:   Discharged Condition: Improved  Hospital Course: Rhonda Morrow is an 63 y.o. female who was admitted 08/14/2024 for operative treatment ofStatus post total replacement of right hip. Patient has severe unremitting pain that affects sleep, daily activities, and work/hobbies. After pre-op clearance the patient was taken to the operating room on 08/14/2024 and underwent  Procedure(s): ARTHROPLASTY, HIP, TOTAL, ANTERIOR APPROACH.    Patient was given perioperative antibiotics:  Anti-infectives (From admission, onward)    Start     Dose/Rate Route Frequency Ordered Stop   08/14/24 2200  doxycycline  (VIBRA -TABS) tablet 100 mg       Note to Pharmacy: To be taken after surgery     100 mg Oral 2 times daily 08/14/24 1344     08/14/24 1700  ceFAZolin  (ANCEF ) IVPB 2g/100 mL premix        2 g 200 mL/hr over 30 Minutes Intravenous Every 6 hours 08/14/24 1344 08/15/24 0105   08/14/24 1057  vancomycin (VANCOCIN) powder  Status:  Discontinued          As needed 08/14/24 1057 08/14/24 1223   08/14/24 0845  ceFAZolin  (ANCEF ) IVPB 2g/100 mL premix        2 g 200 mL/hr over 30 Minutes Intravenous On call to O.R. 08/14/24 9157 08/14/24 1034         Patient was given sequential compression devices, early ambulation, and chemoprophylaxis to prevent DVT.  Inpatient Morphine  Milligram Equivalents Per Day 10/9 - 10/10   Values displayed are in units of MME/Day    Order Start / End Date Yesterday Today    HYDROmorphone  (DILAUDID ) injection 0.25-0.5 mg 10/9 - 10/9 0 of 40-80 --    HYDROcodone -acetaminophen  (NORCO/VICODIN) 5-325 MG per tablet 1-2 tablet 10/9 - No end date 10 of 10-20 10 of 15-30    HYDROcodone -acetaminophen  (NORCO) 7.5-325 MG per tablet 1-2 tablet 10/9 - No end date 7.5 of 15-30 0 of 22.5-45    morphine  (PF) 2 MG/ML injection 0.5-1 mg 10/9 - No end date 6 of 3-6 0 of 6-12    Daily Totals  23.5 of 68-136 10 of 43.5-87       Patient benefited maximally from hospital stay and there were no complications.    Recent vital signs: Patient Vitals for the past 24 hrs:  BP Temp Temp src Pulse Resp SpO2  08/15/24 0926 (!) 135/53 -- -- 77 -- 100 %  08/15/24 0540 (!) 141/63 97.7 F (36.5 C) Oral 70 16 100 %  08/15/24 0134 (!) 105/40 97.7 F (36.5 C) Oral 85 15 97 %  08/14/24 2121 (!) 114/59 (!) 97.5 F (36.4 C) Oral 89 17 95 %  08/14/24 1658 (!)  113/49 -- -- 92 16 99 %  08/14/24 1426 128/67 -- -- 69 18 98 %  08/14/24 1330 (!) 145/86 -- -- 82 18 99 %  08/14/24 1329 (!) 145/86 -- -- 77 19 98 %  08/14/24 1315 (!) 124/93 -- -- 80 (!) 21 96 %  08/14/24 1300 135/73 -- -- 84 (!) 26 95 %  08/14/24 1245 (!) 141/107 (!) 97.5 F (36.4 C) -- 95 19 95 %  08/14/24 1230 (!) 102/91 (!) 97.2 F (36.2 C) -- 84 (!) 27 94 %     Recent laboratory studies:  Recent Labs    08/14/24 1241  WBC 10.9*  HGB 13.5  HCT 44.7  PLT 166     Discharge Medications:   Allergies as of 08/15/2024       Reactions   Losartan  Shortness Of Breath   Oxycodone -acetaminophen  Nausea And Vomiting   Augmentin  [amoxicillin -pot Clavulanate] Diarrhea        Medication List     STOP taking these medications    diclofenac  75 MG EC tablet Commonly  known as: VOLTAREN        TAKE these medications    albuterol  108 (90 Base) MCG/ACT inhaler Commonly known as: VENTOLIN  HFA Inhale 1-2 puffs into the lungs every 6 (six) hours as needed for wheezing or shortness of breath.   allopurinol  100 MG tablet Commonly known as: ZYLOPRIM  Take 1 tablet (100 mg total) by mouth daily.   amLODipine  5 MG tablet Commonly known as: NORVASC  Take 1 tablet (5 mg total) by mouth daily.   aspirin  81 MG chewable tablet Commonly known as: Aspirin  81 Chew 1 tablet (81 mg total) by mouth 2 (two) times daily. To be taken after surgery to prevent blood clots   atorvastatin  20 MG tablet Commonly known as: LIPITOR TAKE 1 TABLET BY MOUTH EVERY DAY   Azelastine  HCl 137 MCG/SPRAY Soln PLACE 2 SPRAYS INTO BOTH NOSTRILS 2 (TWO) TIMES DAILY. USE IN EACH NOSTRIL AS DIRECTED What changed: See the new instructions.   blood glucose meter kit and supplies Kit Dispense based on patient and insurance preference. Use up to four times daily as directed. (FOR ICD-9 250.00, 250.01).   cetirizine  10 MG tablet Commonly known as: ZYRTEC  TAKE 1 TABLET BY MOUTH EVERY DAY   docusate sodium  100 MG capsule Commonly known as: Colace Take 1 capsule (100 mg total) by mouth daily as needed.   doxycycline  100 MG capsule Commonly known as: Vibramycin  Take 1 capsule (100 mg total) by mouth 2 (two) times daily. To be taken after surgery   fluticasone  50 MCG/ACT nasal spray Commonly known as: FLONASE  Place 2 sprays into both nostrils daily. What changed:  when to take this reasons to take this   HYDROcodone -acetaminophen  7.5-325 MG tablet Commonly known as: NORCO Take 1-2 tablets by mouth every 6 (six) hours as needed for moderate pain (pain score 4-6).   meclizine  25 MG tablet Commonly known as: ANTIVERT  Take 1 tablet (25 mg total) by mouth 3 (three) times daily as needed for dizziness.   metFORMIN  500 MG 24 hr tablet Commonly known as: GLUCOPHAGE -XR Take 2 tablets  (1,000 mg total) by mouth daily with breakfast.   methocarbamol  750 MG tablet Commonly known as: ROBAXIN  Take 1 tablet (750 mg total) by mouth 3 (three) times daily as needed.   Na Sulfate-K Sulfate-Mg Sulfate concentrate 17.5-3.13-1.6 GM/177ML Soln Commonly known as: SUPREP Take 1 kit by mouth See admin instructions.   omeprazole  20 MG capsule Commonly known as:  PRILOSEC Take 20 mg by mouth in the morning and at bedtime.   ondansetron  4 MG tablet Commonly known as: Zofran  Take 1 tablet (4 mg total) by mouth every 8 (eight) hours as needed for nausea or vomiting.   potassium chloride  10 MEQ tablet Commonly known as: KLOR-CON  Take 1 tablet (10 mEq total) by mouth 2 (two) times daily.   Rybelsus  3 MG Tabs Generic drug: Semaglutide  Take 3 mg by mouth daily.   Rybelsus  7 MG Tabs Generic drug: Semaglutide  Take 1 tablet (7 mg total) by mouth daily.   solifenacin  5 MG tablet Commonly known as: VESICARE  TAKE 1 TABLET (5 MG TOTAL) BY MOUTH DAILY. What changed:  when to take this reasons to take this   Trelegy Ellipta  100-62.5-25 MCG/ACT Aepb Generic drug: Fluticasone -Umeclidin-Vilant Inhale 1 puff into the lungs daily.               Durable Medical Equipment  (From admission, onward)           Start     Ordered   08/14/24 1344  DME Walker rolling  Once       Question:  Patient needs a walker to treat with the following condition  Answer:  History of hip replacement   08/14/24 1344   08/14/24 1344  DME 3 n 1  Once        08/14/24 1344   08/14/24 1344  DME Bedside commode  Once       Question:  Patient needs a bedside commode to treat with the following condition  Answer:  History of hip replacement   08/14/24 1344            Diagnostic Studies: DG Pelvis Portable Result Date: 08/14/2024 CLINICAL DATA:  Status post right hip replacement. EXAM: PORTABLE PELVIS 1-2 VIEWS COMPARISON:  03/25/2024 FINDINGS: Right hip arthroplasty in expected alignment. No  periprosthetic lucency or fracture. Recent postsurgical change includes air and edema in the soft tissues. Previous left hip arthroplasty. IMPRESSION: Right hip arthroplasty without immediate postoperative complication. Electronically Signed   By: Andrea Gasman M.D.   On: 08/14/2024 15:23   DG HIP UNILAT WITH PELVIS 1V RIGHT Result Date: 08/14/2024 CLINICAL DATA:  Elective surgery. EXAM: DG HIP (WITH OR WITHOUT PELVIS) 1V RIGHT COMPARISON:  None Available. FINDINGS: Two fluoroscopic spot views of the pelvis and right hip obtained in the operating room. Images obtained during hip arthroplasty. Fluoroscopy time 19 seconds. Dose 3.32 mGy. IMPRESSION: Intraoperative fluoroscopy during right hip arthroplasty. Electronically Signed   By: Andrea Gasman M.D.   On: 08/14/2024 11:55   DG C-Arm 1-60 Min-No Report Result Date: 08/14/2024 Fluoroscopy was utilized by the requesting physician.  No radiographic interpretation.   DG C-Arm 1-60 Min-No Report Result Date: 08/14/2024 Fluoroscopy was utilized by the requesting physician.  No radiographic interpretation.    Disposition: Discharge disposition: 01-Home or Self Care          Follow-up Information     Jule Ronal CROME, PA-C. Schedule an appointment as soon as possible for a visit in 2 week(s).   Specialty: Orthopedic Surgery Contact information: [address] [place]  [place] [address] [phone]         [facility] [place]  Follow up.   Why: This provider will reach out to you 24-48 hours after discharge to begin Home Health PT services. Contact information: [address] [place] [address] [phone]  Signed: Ronal LITTIE Grave 08/15/2024, 10:04 AM

## 2024-08-15 NOTE — Plan of Care (Signed)
  Problem: Education: Goal: Knowledge of General Education information will improve Description: Including pain rating scale, medication(s)/side effects and non-pharmacologic comfort measures Outcome: Progressing   Problem: Health Behavior/Discharge Planning: Goal: Ability to manage health-related needs will improve Outcome: Progressing   Problem: Clinical Measurements: Goal: Ability to maintain clinical measurements within normal limits will improve Outcome: Progressing Goal: Will remain free from infection Outcome: Progressing Goal: Diagnostic test results will improve Outcome: Progressing Goal: Respiratory complications will improve Outcome: Progressing Goal: Cardiovascular complication will be avoided Outcome: Progressing   Problem: Activity: Goal: Risk for activity intolerance will decrease Outcome: Progressing   Problem: Nutrition: Goal: Adequate nutrition will be maintained Outcome: Progressing   Problem: Coping: Goal: Level of anxiety will decrease Outcome: Progressing   Problem: Elimination: Goal: Will not experience complications related to bowel motility Outcome: Progressing Goal: Will not experience complications related to urinary retention Outcome: Completed/Met   Problem: Pain Managment: Goal: General experience of comfort will improve and/or be controlled Outcome: Progressing   Problem: Safety: Goal: Ability to remain free from injury will improve Outcome: Progressing   Problem: Skin Integrity: Goal: Risk for impaired skin integrity will decrease Outcome: Progressing   Problem: Nutritional: Goal: Maintenance of adequate nutrition will improve Outcome: Progressing Goal: Progress toward achieving an optimal weight will improve Outcome: Not Applicable   Problem: Skin Integrity: Goal: Risk for impaired skin integrity will decrease Outcome: Progressing   Problem: Tissue Perfusion: Goal: Adequacy of tissue perfusion will improve Outcome:  Adequate for Discharge   Problem: Education: Goal: Knowledge of the prescribed therapeutic regimen will improve Outcome: Progressing Goal: Understanding of discharge needs will improve Outcome: Progressing Goal: Individualized Educational Video(s) Outcome: Completed/Met   Problem: Activity: Goal: Ability to avoid complications of mobility impairment will improve Outcome: Progressing Goal: Ability to tolerate increased activity will improve Outcome: Adequate for Discharge   Problem: Clinical Measurements: Goal: Postoperative complications will be avoided or minimized Outcome: Progressing   Problem: Pain Management: Goal: Pain level will decrease with appropriate interventions Outcome: Progressing   Problem: Skin Integrity: Goal: Will show signs of wound healing Outcome: Progressing

## 2024-08-15 NOTE — Progress Notes (Signed)
 Physical Therapy Treatment Patient Details Name: Rhonda Morrow MRN: [id number] DOB: 15-Aug-1961 Today's Date: 08/15/2024   History of Present Illness Pt s/p R THR and with hx of L THR, DM, and deafness    PT Comments  Pt very cooperative and with marked improvement noted in activity tolerance and stability with gait.  Pt up to bathroom for toileting with hand hygiene standing at sink, ambulated in hall, negotiated step and reviewed car transfers.  Pt pleased with progress and looking forward to return home this date.  Sign language interpreter present for session to assist with communication.    If plan is discharge home, recommend the following: A little help with walking and/or transfers;A little help with bathing/dressing/bathroom;Assistance with cooking/housework;Assist for transportation   Can travel by private vehicle        Equipment Recommendations  Rolling walker (2 wheels)    Recommendations for Other Services       Precautions / Restrictions Precautions Precautions: Fall Restrictions Weight Bearing Restrictions Per Provider Order: No Other Position/Activity Restrictions: WBAT     Mobility  Bed Mobility Overal bed mobility: Needs Assistance             General bed mobility comments: Pt up in chair and returns to same    Transfers Overall transfer level: Needs assistance Equipment used: Rolling walker (2 wheels) Transfers: Sit to/from Stand Sit to Stand: Contact guard assist, Supervision           General transfer comment: cues for LE management and use of UEs to self assist    Ambulation/Gait Ambulation/Gait assistance: Contact guard assist, Supervision Gait Distance (Feet): 75 Feet (and 60' and 15' into bathroom) Assistive device: Rolling walker (2 wheels) Gait Pattern/deviations: Step-to pattern, Decreased step length - right, Decreased step length - left, Shuffle, Trunk flexed Gait velocity: decr     General Gait Details: Cues for sequence,  posture and position from RW   Stairs Stairs: Yes Stairs assistance: Min assist Stair Management: No rails, Step to pattern, Forwards, With walker Number of Stairs: 2 General stair comments: single step twice with cues for sequence   Wheelchair Mobility     Tilt Bed    Modified Rankin (Stroke Patients Only)       Balance Overall balance assessment: Needs assistance Sitting-balance support: No upper extremity supported, Feet supported Sitting balance-Leahy Scale: Good     Standing balance support: No upper extremity supported Standing balance-Leahy Scale: Fair                              Hotel manager: Impaired Factors Affecting Communication: Hearing impaired  Cognition Arousal: Alert Behavior During Therapy: WFL for tasks assessed/performed   PT - Cognitive impairments: No apparent impairments, Difficult to assess Difficult to assess due to: Hard of hearing/deaf                       Following commands: Intact      Cueing Cueing Techniques: Other (comments), Visual cues, Gestural cues (sign language interpreter present to assist)  Exercises Total Joint Exercises Ankle Circles/Pumps: AROM, Both, 15 reps, Supine    General Comments        Pertinent Vitals/Pain Pain Assessment Pain Assessment: Faces Faces Pain Scale: Hurts little more Pain Location: R hip Pain Descriptors / Indicators: Aching, Sore Pain Intervention(s): Limited activity within patient's tolerance, Monitored during session, Premedicated before session, Ice applied    Home  Living                          Prior Function            PT Goals (current goals can now be found in the care plan section) Acute Rehab PT Goals Patient Stated Goal: Regain IND PT Goal Formulation: With patient Time For Goal Achievement: 08/21/24 Potential to Achieve Goals: Good Progress towards PT goals: Progressing toward goals    Frequency     7X/week      PT Plan      Co-evaluation              AM-PAC PT 6 Clicks Mobility   Outcome Measure  Help needed turning from your back to your side while in a flat bed without using bedrails?: A Little Help needed moving from lying on your back to sitting on the side of a flat bed without using bedrails?: A Little Help needed moving to and from a bed to a chair (including a wheelchair)?: A Little Help needed standing up from a chair using your arms (e.g., wheelchair or bedside chair)?: A Little Help needed to walk in hospital room?: A Little Help needed climbing 3-5 steps with a railing? : A Little 6 Click Score: 18    End of Session Equipment Utilized During Treatment: Gait belt Activity Tolerance: Patient tolerated treatment well Patient left: in chair;with call bell/phone within reach;with chair alarm set;Other (comment) (interpreter present) Nurse Communication: Mobility status PT Visit Diagnosis: Difficulty in walking, not elsewhere classified (R26.2)     Time: 8974-8874 PT Time Calculation (min) (ACUTE ONLY): 60 min  Charges:    $Gait Training: 23-37 mins $Therapeutic Activity: 23-37 mins PT General Charges $$ ACUTE PT VISIT: 1 Visit                     [facility] PT Acute Rehabilitation Services Office [phone]    Sophonie Goforth 08/15/2024, 12:46 PM

## 2024-08-15 NOTE — Progress Notes (Signed)
 Discharge instructions given to patient with use of sign language interpreter, through [facility]. Questions asked and answered, patient instructed with Incentive Spirometer with return demonstration.

## 2024-08-16 DIAGNOSIS — Z8616 Personal history of COVID-19: Secondary | ICD-10-CM | POA: Diagnosis not present

## 2024-08-16 DIAGNOSIS — E1169 Type 2 diabetes mellitus with other specified complication: Secondary | ICD-10-CM | POA: Diagnosis not present

## 2024-08-16 DIAGNOSIS — F5104 Psychophysiologic insomnia: Secondary | ICD-10-CM | POA: Diagnosis not present

## 2024-08-16 DIAGNOSIS — J4540 Moderate persistent asthma, uncomplicated: Secondary | ICD-10-CM | POA: Diagnosis not present

## 2024-08-16 DIAGNOSIS — Z96643 Presence of artificial hip joint, bilateral: Secondary | ICD-10-CM | POA: Diagnosis not present

## 2024-08-16 DIAGNOSIS — Z6834 Body mass index (BMI) 34.0-34.9, adult: Secondary | ICD-10-CM | POA: Diagnosis not present

## 2024-08-16 DIAGNOSIS — E66813 Obesity, class 3: Secondary | ICD-10-CM | POA: Diagnosis not present

## 2024-08-16 DIAGNOSIS — H7490 Unspecified disorder of middle ear and mastoid, unspecified ear: Secondary | ICD-10-CM | POA: Diagnosis not present

## 2024-08-16 DIAGNOSIS — G8929 Other chronic pain: Secondary | ICD-10-CM | POA: Diagnosis not present

## 2024-08-16 DIAGNOSIS — Z79891 Long term (current) use of opiate analgesic: Secondary | ICD-10-CM | POA: Diagnosis not present

## 2024-08-16 DIAGNOSIS — M109 Gout, unspecified: Secondary | ICD-10-CM | POA: Diagnosis not present

## 2024-08-16 DIAGNOSIS — Z7984 Long term (current) use of oral hypoglycemic drugs: Secondary | ICD-10-CM | POA: Diagnosis not present

## 2024-08-16 DIAGNOSIS — K582 Mixed irritable bowel syndrome: Secondary | ICD-10-CM | POA: Diagnosis not present

## 2024-08-16 DIAGNOSIS — R32 Unspecified urinary incontinence: Secondary | ICD-10-CM | POA: Diagnosis not present

## 2024-08-16 DIAGNOSIS — I10 Essential (primary) hypertension: Secondary | ICD-10-CM | POA: Diagnosis not present

## 2024-08-16 DIAGNOSIS — Z7982 Long term (current) use of aspirin: Secondary | ICD-10-CM | POA: Diagnosis not present

## 2024-08-16 DIAGNOSIS — Z604 Social exclusion and rejection: Secondary | ICD-10-CM | POA: Diagnosis not present

## 2024-08-16 DIAGNOSIS — H9190 Unspecified hearing loss, unspecified ear: Secondary | ICD-10-CM | POA: Diagnosis not present

## 2024-08-16 DIAGNOSIS — E538 Deficiency of other specified B group vitamins: Secondary | ICD-10-CM | POA: Diagnosis not present

## 2024-08-16 DIAGNOSIS — Z79899 Other long term (current) drug therapy: Secondary | ICD-10-CM | POA: Diagnosis not present

## 2024-08-16 DIAGNOSIS — Z471 Aftercare following joint replacement surgery: Secondary | ICD-10-CM | POA: Diagnosis not present

## 2024-08-16 DIAGNOSIS — E785 Hyperlipidemia, unspecified: Secondary | ICD-10-CM | POA: Diagnosis not present

## 2024-08-16 DIAGNOSIS — M25511 Pain in right shoulder: Secondary | ICD-10-CM | POA: Diagnosis not present

## 2024-08-18 ENCOUNTER — Telehealth: Payer: Self-pay

## 2024-08-18 NOTE — Telephone Encounter (Signed)
 LMOM for [place]ell w/ verbal orders.

## 2024-08-18 NOTE — Telephone Encounter (Signed)
 Copied from CRM [id number]. Topic: Clinical - Home Health Verbal Orders >> Aug 18, 2024  8:28 AM Robinson DEL wrote: Caller/Agency: [facility] Callback Number: [phone] Secure line Service Requested: Physical Therapy Frequency: 1 week 6 Any new concerns about the patient? No

## 2024-08-20 DIAGNOSIS — H7490 Unspecified disorder of middle ear and mastoid, unspecified ear: Secondary | ICD-10-CM | POA: Diagnosis not present

## 2024-08-20 DIAGNOSIS — Z8616 Personal history of COVID-19: Secondary | ICD-10-CM | POA: Diagnosis not present

## 2024-08-20 DIAGNOSIS — G8929 Other chronic pain: Secondary | ICD-10-CM | POA: Diagnosis not present

## 2024-08-20 DIAGNOSIS — E66813 Obesity, class 3: Secondary | ICD-10-CM | POA: Diagnosis not present

## 2024-08-20 DIAGNOSIS — I10 Essential (primary) hypertension: Secondary | ICD-10-CM | POA: Diagnosis not present

## 2024-08-20 DIAGNOSIS — Z6834 Body mass index (BMI) 34.0-34.9, adult: Secondary | ICD-10-CM | POA: Diagnosis not present

## 2024-08-20 DIAGNOSIS — Z79899 Other long term (current) drug therapy: Secondary | ICD-10-CM | POA: Diagnosis not present

## 2024-08-20 DIAGNOSIS — E538 Deficiency of other specified B group vitamins: Secondary | ICD-10-CM | POA: Diagnosis not present

## 2024-08-20 DIAGNOSIS — Z96643 Presence of artificial hip joint, bilateral: Secondary | ICD-10-CM | POA: Diagnosis not present

## 2024-08-20 DIAGNOSIS — Z471 Aftercare following joint replacement surgery: Secondary | ICD-10-CM | POA: Diagnosis not present

## 2024-08-20 DIAGNOSIS — Z604 Social exclusion and rejection: Secondary | ICD-10-CM | POA: Diagnosis not present

## 2024-08-20 DIAGNOSIS — Z7984 Long term (current) use of oral hypoglycemic drugs: Secondary | ICD-10-CM | POA: Diagnosis not present

## 2024-08-20 DIAGNOSIS — Z79891 Long term (current) use of opiate analgesic: Secondary | ICD-10-CM | POA: Diagnosis not present

## 2024-08-20 DIAGNOSIS — E785 Hyperlipidemia, unspecified: Secondary | ICD-10-CM | POA: Diagnosis not present

## 2024-08-20 DIAGNOSIS — M25511 Pain in right shoulder: Secondary | ICD-10-CM | POA: Diagnosis not present

## 2024-08-20 DIAGNOSIS — R32 Unspecified urinary incontinence: Secondary | ICD-10-CM | POA: Diagnosis not present

## 2024-08-20 DIAGNOSIS — J4540 Moderate persistent asthma, uncomplicated: Secondary | ICD-10-CM | POA: Diagnosis not present

## 2024-08-20 DIAGNOSIS — Z7982 Long term (current) use of aspirin: Secondary | ICD-10-CM | POA: Diagnosis not present

## 2024-08-20 DIAGNOSIS — F5104 Psychophysiologic insomnia: Secondary | ICD-10-CM | POA: Diagnosis not present

## 2024-08-20 DIAGNOSIS — H9190 Unspecified hearing loss, unspecified ear: Secondary | ICD-10-CM | POA: Diagnosis not present

## 2024-08-20 DIAGNOSIS — K582 Mixed irritable bowel syndrome: Secondary | ICD-10-CM | POA: Diagnosis not present

## 2024-08-20 DIAGNOSIS — M109 Gout, unspecified: Secondary | ICD-10-CM | POA: Diagnosis not present

## 2024-08-20 DIAGNOSIS — E1169 Type 2 diabetes mellitus with other specified complication: Secondary | ICD-10-CM | POA: Diagnosis not present

## 2024-08-21 ENCOUNTER — Ambulatory Visit: Admitting: Cardiology

## 2024-08-25 ENCOUNTER — Telehealth: Payer: Self-pay | Admitting: Orthopaedic Surgery

## 2024-08-25 ENCOUNTER — Other Ambulatory Visit: Payer: Self-pay | Admitting: Physician Assistant

## 2024-08-25 MED ORDER — HYDROCODONE-ACETAMINOPHEN 5-325 MG PO TABS
1.0000 | ORAL_TABLET | Freq: Three times a day (TID) | ORAL | 0 refills | Status: DC | PRN
Start: 2024-08-25 — End: 2024-08-29

## 2024-08-25 NOTE — Telephone Encounter (Signed)
 Sent norco.  Doxy- no need for referral

## 2024-08-25 NOTE — Telephone Encounter (Signed)
 Rx refill doxycycline  and  Hydrocodone   [facility]

## 2024-08-26 DIAGNOSIS — Z7982 Long term (current) use of aspirin: Secondary | ICD-10-CM | POA: Diagnosis not present

## 2024-08-26 DIAGNOSIS — Z7984 Long term (current) use of oral hypoglycemic drugs: Secondary | ICD-10-CM | POA: Diagnosis not present

## 2024-08-26 DIAGNOSIS — H9190 Unspecified hearing loss, unspecified ear: Secondary | ICD-10-CM | POA: Diagnosis not present

## 2024-08-26 DIAGNOSIS — R32 Unspecified urinary incontinence: Secondary | ICD-10-CM | POA: Diagnosis not present

## 2024-08-26 DIAGNOSIS — J4540 Moderate persistent asthma, uncomplicated: Secondary | ICD-10-CM | POA: Diagnosis not present

## 2024-08-26 DIAGNOSIS — Z79899 Other long term (current) drug therapy: Secondary | ICD-10-CM | POA: Diagnosis not present

## 2024-08-26 DIAGNOSIS — Z471 Aftercare following joint replacement surgery: Secondary | ICD-10-CM | POA: Diagnosis not present

## 2024-08-26 DIAGNOSIS — Z8616 Personal history of COVID-19: Secondary | ICD-10-CM | POA: Diagnosis not present

## 2024-08-26 DIAGNOSIS — M25511 Pain in right shoulder: Secondary | ICD-10-CM | POA: Diagnosis not present

## 2024-08-26 DIAGNOSIS — I10 Essential (primary) hypertension: Secondary | ICD-10-CM | POA: Diagnosis not present

## 2024-08-26 DIAGNOSIS — G8929 Other chronic pain: Secondary | ICD-10-CM | POA: Diagnosis not present

## 2024-08-26 DIAGNOSIS — F5104 Psychophysiologic insomnia: Secondary | ICD-10-CM | POA: Diagnosis not present

## 2024-08-26 DIAGNOSIS — H7490 Unspecified disorder of middle ear and mastoid, unspecified ear: Secondary | ICD-10-CM | POA: Diagnosis not present

## 2024-08-26 DIAGNOSIS — M109 Gout, unspecified: Secondary | ICD-10-CM | POA: Diagnosis not present

## 2024-08-26 DIAGNOSIS — K582 Mixed irritable bowel syndrome: Secondary | ICD-10-CM | POA: Diagnosis not present

## 2024-08-26 DIAGNOSIS — Z6834 Body mass index (BMI) 34.0-34.9, adult: Secondary | ICD-10-CM | POA: Diagnosis not present

## 2024-08-26 DIAGNOSIS — Z604 Social exclusion and rejection: Secondary | ICD-10-CM | POA: Diagnosis not present

## 2024-08-26 DIAGNOSIS — Z96643 Presence of artificial hip joint, bilateral: Secondary | ICD-10-CM | POA: Diagnosis not present

## 2024-08-26 DIAGNOSIS — Z79891 Long term (current) use of opiate analgesic: Secondary | ICD-10-CM | POA: Diagnosis not present

## 2024-08-26 DIAGNOSIS — E66813 Obesity, class 3: Secondary | ICD-10-CM | POA: Diagnosis not present

## 2024-08-26 DIAGNOSIS — E1169 Type 2 diabetes mellitus with other specified complication: Secondary | ICD-10-CM | POA: Diagnosis not present

## 2024-08-26 DIAGNOSIS — E538 Deficiency of other specified B group vitamins: Secondary | ICD-10-CM | POA: Diagnosis not present

## 2024-08-26 DIAGNOSIS — E785 Hyperlipidemia, unspecified: Secondary | ICD-10-CM | POA: Diagnosis not present

## 2024-08-28 ENCOUNTER — Telehealth: Payer: Self-pay | Admitting: Orthopaedic Surgery

## 2024-08-28 NOTE — Telephone Encounter (Signed)
 Called patient. No answer. LMOM with information from message.

## 2024-08-28 NOTE — Telephone Encounter (Signed)
 Right THA 08/14/2024. Please advise.

## 2024-08-28 NOTE — Telephone Encounter (Signed)
 Patient called. Says her foot is swollen. She would like something called in for her. Says she is swollen in her foot and ankle. Her cb# [phone]

## 2024-08-29 ENCOUNTER — Ambulatory Visit (INDEPENDENT_AMBULATORY_CARE_PROVIDER_SITE_OTHER): Admitting: Physician Assistant

## 2024-08-29 DIAGNOSIS — Z96641 Presence of right artificial hip joint: Secondary | ICD-10-CM

## 2024-08-29 MED ORDER — HYDROCODONE-ACETAMINOPHEN 5-325 MG PO TABS: 1.0000 | ORAL_TABLET | Freq: Three times a day (TID) | ORAL | 0 refills | Status: DC | PRN

## 2024-08-29 MED ORDER — METHOCARBAMOL 750 MG PO TABS: 750.0000 mg | ORAL_TABLET | Freq: Three times a day (TID) | ORAL | 2 refills | Status: AC | PRN

## 2024-08-29 NOTE — Progress Notes (Signed)
 Post-Op Visit Note   Patient: Rhonda Morrow           Date of Birth: 1961-10-02           MRN: [id number] Visit Date: 08/29/2024 PCP: Antonio Cyndee Jamee JONELLE, DO   Assessment & Plan:  Chief Complaint:  Chief Complaint  Patient presents with   Right Hip - Follow-up    Right THA 08/14/2024   Visit Diagnoses:  1. Status post total replacement of right hip     Plan: Patient is a very pleasant 63 year old hearing-impaired female who is here today with an ASL interpreter.  She is 2 weeks status post right total hip replacement.  She has been doing okay.  She has been taking Norco and Robaxin  for pain.  She has been taking only 1 baby aspirin  daily for DVT prophylaxis.  She has been getting home health PT and is ambulating with a walker.  She has not been wearing her compression stockings.  Examination of right hip reveals well-healing surgical incision with nylon sutures in place.  No evidence of infection or cellulitis.  She has moderate swelling to the right lower extremity.  Calf is soft and nontender.  She has neurovascular intact distally.  Today, her sutures were removed and Steri-Strips applied.  I have refilled her Norco and muscle relaxer.  I have instructed her to take a baby aspirin  twice daily instead of once daily for the next 4 weeks.  I have recommended compression stockings and elevation as often as possible.  She will follow-up in 4 weeks for repeat evaluation and AP pelvis x-rays.  Call with concerns or questions.  Follow-Up Instructions: Return in about 4 weeks (around 09/26/2024) for with Dr. Jerri.   Orders:  No orders of the defined types were placed in this encounter.  Meds ordered this encounter  Medications   HYDROcodone -acetaminophen  (NORCO/VICODIN) 5-325 MG tablet    Sig: Take 1-2 tablets by mouth 3 (three) times daily as needed for moderate pain (pain score 4-6).    Dispense:  30 tablet    Refill:  0   methocarbamol  (ROBAXIN ) 750 MG tablet    Sig: Take 1 tablet  (750 mg total) by mouth 3 (three) times daily as needed.    Dispense:  30 tablet    Refill:  2    Imaging: No new imaging  PMFS History: Patient Active Problem List   Diagnosis Date Noted   Status post total replacement of right hip 08/14/2024   Gout 06/02/2024   Hearing loss 06/02/2024   Mastoid disorder, unspecified laterality 04/01/2024   Irritable bowel syndrome with both constipation and diarrhea 07/20/2023   Urinary incontinence 01/31/2023   Vaginal itching 01/31/2023   Influenza 11/20/2022   Postmenopausal bleeding 08/18/2022   Acute non-recurrent pansinusitis 06/18/2022   Leg cramps 03/08/2022   Muscle spasm of right lower extremity 03/08/2022   Unilateral primary osteoarthritis, right hip 03/01/2022   ACE inhibitor intolerance 01/19/2022   Weakness 12/22/2021   Left lower quadrant abdominal pain 12/22/2021   Slow transit constipation 12/22/2021   Class 3 severe obesity due to excess calories without serious comorbidity with body mass index (BMI) of 40.0 to 44.9 in adult [facility]) 07/05/2021   Restless leg syndrome 07/05/2021   Chronic seasonal allergic rhinitis due to pollen 07/05/2021   Psychophysiological insomnia 07/05/2021   Non-compliance 07/05/2021   COVID-19 07/05/2021   Right wrist pain 06/05/2021   Acute pain of left shoulder 01/24/2021   Acute pain of  right shoulder 01/24/2021   Foot pain, bilateral 01/24/2021   Dyspepsia 01/24/2021   Preop cardiovascular exam 01/24/2021   Right hand pain 12/29/2020   OSA (obstructive sleep apnea) 09/12/2020   Nocturia more than twice per night 08/03/2020   Non-restorative sleep 08/03/2020   Leg cramping 08/03/2020   Hyperlipidemia associated with type 2 diabetes mellitus (HCC) 07/25/2020   Large breasts 07/22/2020   RLS (restless legs syndrome) 05/17/2020   Cough 04/01/2020   Asthma, moderate persistent, poorly-controlled 04/01/2020   Medication management 04/01/2020   Menopausal symptoms 03/29/2020   Hot flashes  03/29/2020   Chronic right shoulder pain 08/11/2019   Acute non-recurrent maxillary sinusitis 08/11/2019   BMI 40.0-44.9, adult (HCC) 01/22/2019   Right lower quadrant abdominal pain 10/20/2018   Rectal pain 10/20/2018   Vitamin B 12 deficiency 03/28/2018   B12 deficiency 03/28/2018   DM (diabetes mellitus) type II uncontrolled, periph vascular disorder 03/28/2018   Hyperlipidemia 03/28/2018   Primary osteoarthritis of left hip 02/21/2017   Status post total hip replacement, left 02/21/2017   History of laparoscopic cholecystectomy 06/20/2016   Cerumen impaction 06/09/2016   Hypersomnia 05/16/2016   Knee pain, right 05/05/2016   RUQ pain 04/09/2016   Abnormal CT scan 03/28/2016   Dyspnea and respiratory abnormalities 03/15/2016   Asthma with acute exacerbation 03/15/2016   Asthma in adult 02/25/2016   DM (diabetes mellitus) type II controlled, neurological manifestation (HCC) 02/09/2016   Right hip pain 12/23/2015   Right hamstring muscle strain 11/18/2015   Abdominal pain, acute 09/01/2015   Acute asthma exacerbation 04/16/2015   Acute bronchitis 04/14/2015   Pap smear for cervical cancer screening 09/14/2014   Bed bug bite 05/06/2014   Allergic rhinitis 04/09/2014   Rectal itching 04/09/2014   Bladder spasm 04/09/2014   Knee pain, left 03/05/2014   Hypokalemia 02/19/2014   Nausea with vomiting 02/19/2014   Diabetes mellitus, type II (HCC) 02/19/2014   Edema 02/16/2014   Disorder of rotator cuff 11/12/2013   Routine general medical examination at a health care facility 08/20/2013   Gastroesophageal reflux disease 06/26/2013   Dysphagia, pharyngoesophageal phase 06/26/2013   Suprapubic abdominal pain 05/12/2013   Medication side effect 03/25/2013   Diarrhea 03/25/2013   Benign positional vertigo 01/30/2013   Traumatic hematoma of thigh 01/15/2013   HTN (hypertension) 09/02/2012   Dry skin 08/22/2012   External hemorrhoids 08/22/2012   Obesity 06/30/2012   Thyromegaly  05/22/2012   Back pain 05/22/2012   Elevated glucose 05/22/2012   Moderate persistent asthma 04/19/2012   Dyspnea 01/28/2012   Past Medical History:  Diagnosis Date   Arthritis    Asthma    Complication of anesthesia    one time woke up and was vey scared,17 yrs ago   Deaf    Depression    Diabetes mellitus without complication (HCC)    GERD (gastroesophageal reflux disease)    Hypertension    Left groin pain    Nausea with vomiting 02/19/2014   Thyroid  disease     Family History  Problem Relation Age of Onset   Diabetes Mother    Heart disease Mother    Diabetes Father    Colon cancer Neg Hx    Colon polyps Neg Hx    Esophageal cancer Neg Hx    Rectal cancer Neg Hx    Stomach cancer Neg Hx     Past Surgical History:  Procedure Laterality Date   CARDIAC CATHETERIZATION     CESAREAN SECTION  CHOLECYSTECTOMY N/A 06/20/2016   Procedure: LAPAROSCOPIC CHOLECYSTECTOMY;  Surgeon: Donnice Bury, MD;  Location: [facility] OR;  Service: General;  Laterality: N/A;   ESOPHAGEAL MANOMETRY N/A 09/29/2013   Procedure: ESOPHAGEAL MANOMETRY (EM);  Surgeon: Toribio SHAUNNA Cedar, MD;  Location: [facility] ENDOSCOPY;  Service: Endoscopy;  Laterality: N/A;   TOTAL HIP ARTHROPLASTY Left 02/21/2017   Procedure: LEFT TOTAL HIP ARTHROPLASTY ANTERIOR APPROACH;  Surgeon: Kay CHRISTELLA Cummins, MD;  Location: [facility] OR;  Service: Orthopedics;  Laterality: Left;   TOTAL HIP ARTHROPLASTY Right 08/14/2024   Procedure: ARTHROPLASTY, HIP, TOTAL, ANTERIOR APPROACH;  Surgeon: Cummins Kay CHRISTELLA, MD;  Location: [facility] ORS;  Service: Orthopedics;  Laterality: Right;   Social History   Occupational History   Occupation: disabled  Tobacco Use   Smoking status: Never   Smokeless tobacco: Never  Vaping Use   Vaping status: Never Used  Substance and Sexual Activity   Alcohol use: Yes    Alcohol/week: 1.0 standard drink of alcohol    Types: 1 Glasses of wine per week    Comment: occas   Drug use: No   Sexual activity: Not Currently

## 2024-09-02 ENCOUNTER — Ambulatory Visit: Admitting: Rehabilitation

## 2024-09-02 DIAGNOSIS — M25511 Pain in right shoulder: Secondary | ICD-10-CM | POA: Diagnosis not present

## 2024-09-02 DIAGNOSIS — Z96643 Presence of artificial hip joint, bilateral: Secondary | ICD-10-CM | POA: Diagnosis not present

## 2024-09-02 DIAGNOSIS — M109 Gout, unspecified: Secondary | ICD-10-CM | POA: Diagnosis not present

## 2024-09-02 DIAGNOSIS — Z7982 Long term (current) use of aspirin: Secondary | ICD-10-CM | POA: Diagnosis not present

## 2024-09-02 DIAGNOSIS — G8929 Other chronic pain: Secondary | ICD-10-CM | POA: Diagnosis not present

## 2024-09-02 DIAGNOSIS — Z604 Social exclusion and rejection: Secondary | ICD-10-CM | POA: Diagnosis not present

## 2024-09-02 DIAGNOSIS — E538 Deficiency of other specified B group vitamins: Secondary | ICD-10-CM | POA: Diagnosis not present

## 2024-09-02 DIAGNOSIS — R32 Unspecified urinary incontinence: Secondary | ICD-10-CM | POA: Diagnosis not present

## 2024-09-02 DIAGNOSIS — Z6834 Body mass index (BMI) 34.0-34.9, adult: Secondary | ICD-10-CM | POA: Diagnosis not present

## 2024-09-02 DIAGNOSIS — H9190 Unspecified hearing loss, unspecified ear: Secondary | ICD-10-CM | POA: Diagnosis not present

## 2024-09-02 DIAGNOSIS — Z7984 Long term (current) use of oral hypoglycemic drugs: Secondary | ICD-10-CM | POA: Diagnosis not present

## 2024-09-02 DIAGNOSIS — F5104 Psychophysiologic insomnia: Secondary | ICD-10-CM | POA: Diagnosis not present

## 2024-09-02 DIAGNOSIS — H7490 Unspecified disorder of middle ear and mastoid, unspecified ear: Secondary | ICD-10-CM | POA: Diagnosis not present

## 2024-09-02 DIAGNOSIS — E66813 Obesity, class 3: Secondary | ICD-10-CM | POA: Diagnosis not present

## 2024-09-02 DIAGNOSIS — E785 Hyperlipidemia, unspecified: Secondary | ICD-10-CM | POA: Diagnosis not present

## 2024-09-02 DIAGNOSIS — Z8616 Personal history of COVID-19: Secondary | ICD-10-CM | POA: Diagnosis not present

## 2024-09-02 DIAGNOSIS — J4540 Moderate persistent asthma, uncomplicated: Secondary | ICD-10-CM | POA: Diagnosis not present

## 2024-09-02 DIAGNOSIS — Z471 Aftercare following joint replacement surgery: Secondary | ICD-10-CM | POA: Diagnosis not present

## 2024-09-02 DIAGNOSIS — I10 Essential (primary) hypertension: Secondary | ICD-10-CM | POA: Diagnosis not present

## 2024-09-02 DIAGNOSIS — K582 Mixed irritable bowel syndrome: Secondary | ICD-10-CM | POA: Diagnosis not present

## 2024-09-02 DIAGNOSIS — Z79891 Long term (current) use of opiate analgesic: Secondary | ICD-10-CM | POA: Diagnosis not present

## 2024-09-02 DIAGNOSIS — E1169 Type 2 diabetes mellitus with other specified complication: Secondary | ICD-10-CM | POA: Diagnosis not present

## 2024-09-02 DIAGNOSIS — Z79899 Other long term (current) drug therapy: Secondary | ICD-10-CM | POA: Diagnosis not present

## 2024-09-07 ENCOUNTER — Other Ambulatory Visit: Payer: Self-pay | Admitting: Physician Assistant

## 2024-09-08 ENCOUNTER — Encounter: Payer: Self-pay | Admitting: Family Medicine

## 2024-09-08 ENCOUNTER — Encounter: Payer: Self-pay | Admitting: Radiology

## 2024-09-08 ENCOUNTER — Ambulatory Visit (INDEPENDENT_AMBULATORY_CARE_PROVIDER_SITE_OTHER): Admitting: Family Medicine

## 2024-09-08 VITALS — BP 113/71 | HR 95 | Temp 98.0°F | Resp 18 | Ht 64.0 in | Wt 208.8 lb

## 2024-09-08 DIAGNOSIS — B372 Candidiasis of skin and nail: Secondary | ICD-10-CM | POA: Diagnosis not present

## 2024-09-08 MED ORDER — NYSTATIN 100000 UNIT/GM EX POWD: 1.0000 | Freq: Three times a day (TID) | CUTANEOUS | 1 refills | Status: AC

## 2024-09-08 NOTE — Progress Notes (Signed)
 Acute Office Visit  Subjective:  Patient ID: Rhonda Morrow, female    DOB: 02/06/1961  Age: 63 y.o. MRN: [id number]  CC:  Chief Complaint  Patient presents with   Rash      HPI Rhonda Morrow is here for Itching Under Breast (right).    Discussed the use of AI scribe software for clinical note transcription with the patient, who gave verbal consent to proceed.  History of Present Illness Rhonda Morrow is a 63 year old female who presents with irritation and odor under her right breast.  She has been experiencing irritation and a noticeable odor under her right breast since Thursday or Friday of last week. The area is moist with a bad odor. Her husband has been assisting by placing a dry rag under the breast, which she feels has helped to some extent.  The area under the breast is sensitive to touch, described as 'hard' and 'moist' by the patient. There is slight itching in the area. She has not been wearing a bra at home to allow the area to air out, but wore one to the appointment.        Past Medical History:  Diagnosis Date   Arthritis    Asthma    Complication of anesthesia    one time woke up and was vey scared,17 yrs ago   Deaf    Depression    Diabetes mellitus without complication (HCC)    GERD (gastroesophageal reflux disease)    Hypertension    Left groin pain    Nausea with vomiting 02/19/2014   Thyroid  disease     Past Surgical History:  Procedure Laterality Date   CARDIAC CATHETERIZATION     CESAREAN SECTION     CHOLECYSTECTOMY N/A 06/20/2016   Procedure: LAPAROSCOPIC CHOLECYSTECTOMY;  Surgeon: Donnice Bury, MD;  Location: [facility] OR;  Service: General;  Laterality: N/A;   ESOPHAGEAL MANOMETRY N/A 09/29/2013   Procedure: ESOPHAGEAL MANOMETRY (EM);  Surgeon: Toribio SHAUNNA Cedar, MD;  Location: [facility];  Service: Endoscopy;  Laterality: N/A;   TOTAL HIP ARTHROPLASTY Left 02/21/2017   Procedure: LEFT TOTAL HIP ARTHROPLASTY ANTERIOR APPROACH;   Surgeon: Kay CHRISTELLA Cummins, MD;  Location: [facility] OR;  Service: Orthopedics;  Laterality: Left;   TOTAL HIP ARTHROPLASTY Right 08/14/2024   Procedure: ARTHROPLASTY, HIP, TOTAL, ANTERIOR APPROACH;  Surgeon: Cummins Kay CHRISTELLA, MD;  Location: [facility] ORS;  Service: Orthopedics;  Laterality: Right;    Family History  Problem Relation Age of Onset   Diabetes Mother    Heart disease Mother    Diabetes Father    Colon cancer Neg Hx    Colon polyps Neg Hx    Esophageal cancer Neg Hx    Rectal cancer Neg Hx    Stomach cancer Neg Hx     Social History   Socioeconomic History   Marital status: Married    Spouse name: Not on file   Number of children: Not on file   Years of education: Not on file   Highest education level: Not on file  Occupational History   Occupation: disabled  Tobacco Use   Smoking status: Never   Smokeless tobacco: Never  Vaping Use   Vaping status: Never Used  Substance and Sexual Activity   Alcohol use: Yes    Alcohol/week: 1.0 standard drink of alcohol    Types: 1 Glasses of wine per week    Comment: occas   Drug use: No   Sexual activity: Not Currently  Other Topics Concern   Not on file  Social History Narrative   Lives with family.  Has 3 children.  Works for [employer]  Education: high school.   Social Drivers of Corporate Investment Banker Strain: Not on file  Food Insecurity: No Food Insecurity (08/14/2024)   Hunger Vital Sign    Worried About Running Out of Food in the Last Year: Never true    Ran Out of Food in the Last Year: Never true  Transportation Needs: No Transportation Needs (08/14/2024)   PRAPARE - Administrator, Civil Service (Medical): No    Lack of Transportation (Non-Medical): No  Physical Activity: Not on file  Stress: Not on file  Social Connections: Unknown (03/18/2022)   Received from [facility]   Social Network    Social Network: Not on file  Intimate Partner Violence: Not At Risk (08/14/2024)   Humiliation, Afraid, Rape, and  Kick questionnaire    Fear of Current or Ex-Partner: No    Emotionally Abused: No    Physically Abused: No    Sexually Abused: No    ROS All ROS negative except what is listed in the HPI.   Objective:   Today's Vitals: BP 113/71   Pulse 95   Temp 98 F (36.7 C)   Resp 18   Ht 5' 4 (1.626 m)   Wt 208 lb 12.8 oz (94.7 kg)   LMP 05/20/2012   SpO2 95%   BMI 35.84 kg/m   Physical Exam Vitals reviewed.  Constitutional:      Appearance: Normal appearance.  Skin:    Comments: Yeast dermatitis under right breast, no open lesions   Neurological:     Mental Status: She is alert.  Psychiatric:        Mood and Affect: Mood normal.        Behavior: Behavior normal.        Thought Content: Thought content normal.        Judgment: Judgment normal.         Assessment & Plan:   Problem List Items Addressed This Visit   None Visit Diagnoses       Yeast dermatitis    -  Primary   Relevant Medications   nystatin  (MYCOSTATIN /NYSTOP ) powder       Assessment & Plan Intertrigo with secondary candidal infection, right inframammary fold Moist, odorous, sensitive area likely due to moisture and warmth. Improving with dryness measures. - Prescribed antifungal powder for 2-3 daily application until resolution. - Advised keeping area dry using a cool hairdryer post-shower. - Recommended using a dry rag or pillowcase wick moisture aware to maintain dryness. - Instructed to avoid scratching. - Advised wearing loose, cotton clothing and avoiding bras for aeration. - [facility] [address] [facility] with refill option.  Patient aware of signs/symptoms requiring further/urgent evaluation.         Follow-up: Return if symptoms worsen or fail to improve.   Waddell FURY Almarie, DNP, FNP-C  I,Emily Lagle,acting as a neurosurgeon for Waddell KATHEE Almarie, NP.,have documented all relevant documentation on the behalf of Waddell KATHEE Almarie, NP,as directed by  Waddell KATHEE Almarie, NP while in the  presence of Waddell KATHEE Almarie, NP.   I, Waddell KATHEE Almarie, NP, have reviewed all documentation for this visit. The documentation on 09/08/2024 for the exam, diagnosis, procedures, and orders are all accurate and complete.

## 2024-09-08 NOTE — Telephone Encounter (Signed)
 Only needs bid x 6 weeks after surgery

## 2024-09-12 ENCOUNTER — Other Ambulatory Visit: Payer: Self-pay

## 2024-09-12 ENCOUNTER — Telehealth: Payer: Self-pay | Admitting: *Deleted

## 2024-09-12 DIAGNOSIS — Z471 Aftercare following joint replacement surgery: Secondary | ICD-10-CM | POA: Diagnosis not present

## 2024-09-12 DIAGNOSIS — Z7984 Long term (current) use of oral hypoglycemic drugs: Secondary | ICD-10-CM | POA: Diagnosis not present

## 2024-09-12 DIAGNOSIS — Z8616 Personal history of COVID-19: Secondary | ICD-10-CM | POA: Diagnosis not present

## 2024-09-12 DIAGNOSIS — Z6834 Body mass index (BMI) 34.0-34.9, adult: Secondary | ICD-10-CM | POA: Diagnosis not present

## 2024-09-12 DIAGNOSIS — M109 Gout, unspecified: Secondary | ICD-10-CM | POA: Diagnosis not present

## 2024-09-12 DIAGNOSIS — Z96641 Presence of right artificial hip joint: Secondary | ICD-10-CM

## 2024-09-12 DIAGNOSIS — E1169 Type 2 diabetes mellitus with other specified complication: Secondary | ICD-10-CM | POA: Diagnosis not present

## 2024-09-12 DIAGNOSIS — G8929 Other chronic pain: Secondary | ICD-10-CM | POA: Diagnosis not present

## 2024-09-12 DIAGNOSIS — E785 Hyperlipidemia, unspecified: Secondary | ICD-10-CM | POA: Diagnosis not present

## 2024-09-12 DIAGNOSIS — I10 Essential (primary) hypertension: Secondary | ICD-10-CM | POA: Diagnosis not present

## 2024-09-12 DIAGNOSIS — E538 Deficiency of other specified B group vitamins: Secondary | ICD-10-CM | POA: Diagnosis not present

## 2024-09-12 DIAGNOSIS — Z79891 Long term (current) use of opiate analgesic: Secondary | ICD-10-CM | POA: Diagnosis not present

## 2024-09-12 DIAGNOSIS — M25511 Pain in right shoulder: Secondary | ICD-10-CM | POA: Diagnosis not present

## 2024-09-12 DIAGNOSIS — E66813 Obesity, class 3: Secondary | ICD-10-CM | POA: Diagnosis not present

## 2024-09-12 DIAGNOSIS — Z79899 Other long term (current) drug therapy: Secondary | ICD-10-CM | POA: Diagnosis not present

## 2024-09-12 DIAGNOSIS — H9190 Unspecified hearing loss, unspecified ear: Secondary | ICD-10-CM | POA: Diagnosis not present

## 2024-09-12 DIAGNOSIS — J4540 Moderate persistent asthma, uncomplicated: Secondary | ICD-10-CM | POA: Diagnosis not present

## 2024-09-12 DIAGNOSIS — Z604 Social exclusion and rejection: Secondary | ICD-10-CM | POA: Diagnosis not present

## 2024-09-12 DIAGNOSIS — F5104 Psychophysiologic insomnia: Secondary | ICD-10-CM | POA: Diagnosis not present

## 2024-09-12 DIAGNOSIS — K582 Mixed irritable bowel syndrome: Secondary | ICD-10-CM | POA: Diagnosis not present

## 2024-09-12 DIAGNOSIS — R32 Unspecified urinary incontinence: Secondary | ICD-10-CM | POA: Diagnosis not present

## 2024-09-12 DIAGNOSIS — Z7982 Long term (current) use of aspirin: Secondary | ICD-10-CM | POA: Diagnosis not present

## 2024-09-12 DIAGNOSIS — H7490 Unspecified disorder of middle ear and mastoid, unspecified ear: Secondary | ICD-10-CM | POA: Diagnosis not present

## 2024-09-12 DIAGNOSIS — Z96643 Presence of artificial hip joint, bilateral: Secondary | ICD-10-CM | POA: Diagnosis not present

## 2024-09-12 NOTE — Telephone Encounter (Signed)
 Sure let's do doppler. Thanks.

## 2024-09-12 NOTE — Telephone Encounter (Signed)
 Home Health therapist called me just now to let me know that he was working with patient and she is c/o pain in lateral calf along with some swelling in that area and pain with ambulation. She is taking her baby asa bid as ordered. R-THA on 08/14/24 it looks like. She is not one I am following, they just called me to update. Any recommendations? Doppler?

## 2024-09-12 NOTE — Telephone Encounter (Signed)
 Ordered U/S they will call son to schedule.

## 2024-09-16 NOTE — Therapy (Signed)
 OUTPATIENT PHYSICAL THERAPY VESTIBULAR EVALUATION     Patient Name: Rhonda Morrow MRN: [id number] DOB:11-18-1960, 63 y.o., female Today's Date: 09/21/2024  END OF SESSION:    Past Medical History:  Diagnosis Date   Arthritis    Asthma    Complication of anesthesia    one time woke up and was vey scared,17 yrs ago   Deaf    Depression    Diabetes mellitus without complication (HCC)    GERD (gastroesophageal reflux disease)    Hypertension    Left groin pain    Nausea with vomiting 02/19/2014   Thyroid  disease    Past Surgical History:  Procedure Laterality Date   CARDIAC CATHETERIZATION     CESAREAN SECTION     CHOLECYSTECTOMY N/A 06/20/2016   Procedure: LAPAROSCOPIC CHOLECYSTECTOMY;  Surgeon: Donnice Bury, MD;  Location: [facility] OR;  Service: General;  Laterality: N/A;   ESOPHAGEAL MANOMETRY N/A 09/29/2013   Procedure: ESOPHAGEAL MANOMETRY (EM);  Surgeon: Toribio SHAUNNA Cedar, MD;  Location: [facility] ENDOSCOPY;  Service: Endoscopy;  Laterality: N/A;   TOTAL HIP ARTHROPLASTY Left 02/21/2017   Procedure: LEFT TOTAL HIP ARTHROPLASTY ANTERIOR APPROACH;  Surgeon: Kay CHRISTELLA Cummins, MD;  Location: [facility] OR;  Service: Orthopedics;  Laterality: Left;   TOTAL HIP ARTHROPLASTY Right 08/14/2024   Procedure: ARTHROPLASTY, HIP, TOTAL, ANTERIOR APPROACH;  Surgeon: Cummins Kay CHRISTELLA, MD;  Location: [facility] ORS;  Service: Orthopedics;  Laterality: Right;   Patient Active Problem List   Diagnosis Date Noted   Status post total replacement of right hip 08/14/2024   Gout 06/02/2024   Hearing loss 06/02/2024   Mastoid disorder, unspecified laterality 04/01/2024   Irritable bowel syndrome with both constipation and diarrhea 07/20/2023   Urinary incontinence 01/31/2023   Vaginal itching 01/31/2023   Influenza 11/20/2022   Postmenopausal bleeding 08/18/2022   Acute non-recurrent pansinusitis 06/18/2022   Leg cramps 03/08/2022   Muscle spasm of right lower extremity 03/08/2022   Unilateral primary osteoarthritis,  right hip 03/01/2022   ACE inhibitor intolerance 01/19/2022   Weakness 12/22/2021   Left lower quadrant abdominal pain 12/22/2021   Slow transit constipation 12/22/2021   Class 3 severe obesity due to excess calories without serious comorbidity with body mass index (BMI) of 40.0 to 44.9 in adult (HCC) 07/05/2021   Restless leg syndrome 07/05/2021   Chronic seasonal allergic rhinitis due to pollen 07/05/2021   Psychophysiological insomnia 07/05/2021   Non-compliance 07/05/2021   COVID-19 07/05/2021   Right wrist pain 06/05/2021   Acute pain of left shoulder 01/24/2021   Acute pain of right shoulder 01/24/2021   Foot pain, bilateral 01/24/2021   Dyspepsia 01/24/2021   Preop cardiovascular exam 01/24/2021   Right hand pain 12/29/2020   OSA (obstructive sleep apnea) 09/12/2020   Nocturia more than twice per night 08/03/2020   Non-restorative sleep 08/03/2020   Leg cramping 08/03/2020   Hyperlipidemia associated with type 2 diabetes mellitus (HCC) 07/25/2020   Large breasts 07/22/2020   RLS (restless legs syndrome) 05/17/2020   Cough 04/01/2020   Asthma, moderate persistent, poorly-controlled 04/01/2020   Medication management 04/01/2020   Menopausal symptoms 03/29/2020   Hot flashes 03/29/2020   Chronic right shoulder pain 08/11/2019   Acute non-recurrent maxillary sinusitis 08/11/2019   BMI 40.0-44.9, adult (HCC) 01/22/2019   Right lower quadrant abdominal pain 10/20/2018   Rectal pain 10/20/2018   Vitamin B 12 deficiency 03/28/2018   B12 deficiency 03/28/2018   DM (diabetes mellitus) type II uncontrolled, periph vascular disorder 03/28/2018   Hyperlipidemia 03/28/2018  Primary osteoarthritis of left hip 02/21/2017   Status post total hip replacement, left 02/21/2017   History of laparoscopic cholecystectomy 06/20/2016   Cerumen impaction 06/09/2016   Hypersomnia 05/16/2016   Knee pain, right 05/05/2016   RUQ pain 04/09/2016   Abnormal CT scan 03/28/2016   Dyspnea and  respiratory abnormalities 03/15/2016   Asthma with acute exacerbation 03/15/2016   Asthma in adult 02/25/2016   DM (diabetes mellitus) type II controlled, neurological manifestation (HCC) 02/09/2016   Right hip pain 12/23/2015   Right hamstring muscle strain 11/18/2015   Abdominal pain, acute 09/01/2015   Acute asthma exacerbation 04/16/2015   Acute bronchitis 04/14/2015   Pap smear for cervical cancer screening 09/14/2014   Bed bug bite 05/06/2014   Allergic rhinitis 04/09/2014   Rectal itching 04/09/2014   Bladder spasm 04/09/2014   Knee pain, left 03/05/2014   Hypokalemia 02/19/2014   Nausea with vomiting 02/19/2014   Diabetes mellitus, type II (HCC) 02/19/2014   Edema 02/16/2014   Disorder of rotator cuff 11/12/2013   Routine general medical examination at a health care facility 08/20/2013   Gastroesophageal reflux disease 06/26/2013   Dysphagia, pharyngoesophageal phase 06/26/2013   Suprapubic abdominal pain 05/12/2013   Medication side effect 03/25/2013   Diarrhea 03/25/2013   Benign positional vertigo 01/30/2013   Traumatic hematoma of thigh 01/15/2013   HTN (hypertension) 09/02/2012   Dry skin 08/22/2012   External hemorrhoids 08/22/2012   Obesity 06/30/2012   Thyromegaly 05/22/2012   Back pain 05/22/2012   Elevated glucose 05/22/2012   Moderate persistent asthma 04/19/2012   Dyspnea 01/28/2012    PCP: Dr Antonio  REFERRING PROVIDER:Edward Saguier, PA-C  REFERRING DIAG: R42 (ICD-10-CM) - Vertigo  THERAPY DIAG:   BPPV (benign paroxysmal positional vertigo), left - Plan: PT plan of care cert/re-cert  Dizziness and giddiness - Plan: PT plan of care cert/re-cert  Other abnormalities of gait and mobility - Plan: PT plan of care cert/re-cert  Muscle weakness (generalized) - Plan: PT plan of care cert/re-cert  ONSET DATE:   Rationale for Evaluation and Treatment: Rehabilitation  SUBJECTIVE:   SUBJECTIVE STATEMENT: 63 y/o patient referred to PT from PCP for  vertigo.  Of note, the patient is deaf and communicates via sign language and an interpreter is present for this visit.     She reports she went to her audiologist almost a year ago and when they shot water in her ears she got very dizzy.   The symptoms went away, but have returned off and on since that time until recent week or so when they have become severe. Symptoms are worst when getting up from bed to go to the bathroom at night.   She falls to the L.  She denies ever having this problem before going to the audiologist. She lives with her spouse who is also deaf.   Unknown how his health is.   Patient comes to clinic using a walker.    Of note, she had an anterior R THA a month ago by Dr Jerri, and has completed her [facility] PT for this issue.  She would like PT for this as well but we do not have any orders from Dr Jerri.   Will request orders per patient request  Pt accompanied by: self  PERTINENT HISTORY: OA, DM, moderate asthma, deafness, recent anterior THA by Dr Jerri,   PAIN:  Are you having pain? Yes: NPRS scale: 4/10 now Pain location: R hip anterior Pain description: achy Aggravating factors: lifting her  leg into bed or lying down Relieving factors: nonoffending positions  PRECAUTIONS: Anterior hip  RED FLAGS: None   WEIGHT BEARING RESTRICTIONS: No  FALLS: Has patient fallen in last 6 months? Yes. Number of falls 1  LIVING ENVIRONMENT: Lives with: lives with their family, lives with their spouse, and lives with their son Lives in: House/apartment Stairs: Yes: External: several steps; unknown Has following equipment at home: Walker - 2 wheeled  PLOF: Independent with gait  PATIENT GOALS: walk better, get rid of the dizziness  OBJECTIVE:  Note: Objective measures were completed at Evaluation unless otherwise noted.  DIAGNOSTIC FINDINGS: EXAM: MRI BRAIN WITHOUT CONTRAST 03/26/2024 09:24:46 PM   TECHNIQUE: Multiplanar multisequence MRI of the head/brain was performed without  the administration of intravenous contrast.   COMPARISON: CT head without contrast 07/31/2019.   CLINICAL HISTORY: Vertigo.   FINDINGS:   BRAIN AND VENTRICLES: No acute infarct. No intracranial hemorrhage. No mass. No midline shift. No hydrocephalus. The sella is unremarkable. Normal flow voids. Scattered subcortical T2 hyperintensities bilaterally are moderately advanced for age.   ORBITS: No acute abnormality.   SINUSES AND MASTOIDS: Mastoid effusion is again noted. No obstructing nasopharyngeal lesion is present.   BONES AND SOFT TISSUES: Normal marrow signal. No acute soft tissue abnormality. Mild degenerative changes are present in the upper cervical spine.   IMPRESSION: 1. No acute intracranial abnormality related to vertigo. 2. Moderately advanced scattered subcortical T2 hyperintensities bilaterally for age. This likely reflects the sequelae of chronic microvascular ischemia. 3. Mastoid effusion, again noted. No obstructing nasopharyngeal lesion.   Electronically signed by: Lonni Necessary MD 03/26/2024 09:34 PM EDT RP Workstation: [id number]  COGNITION: Overall cognitive status: Within functional limits for tasks assessed   SENSATION: WFL  EDEMA:  Mild R thigh  MUSCLE TONE:    DTRs:    POSTURE:  rounded shoulders and forward head  Cervical ROM:    Active A/PROM (deg) eval  Flexion 100%  Extension 50%  Right lateral flexion 50%  Left lateral flexion 65%  Right rotation 80%  Left rotation 80%  (Blank rows = not tested)  STRENGTH: nt  LOWER EXTREMITY MMT:   MMT Right eval Left eval  Hip flexion    Hip abduction    Hip adduction    Hip internal rotation    Hip external rotation    Knee flexion    Knee extension    Ankle dorsiflexion    Ankle plantarflexion    Ankle inversion    Ankle eversion    (Blank rows = not tested)  BED MOBILITY:  Sit to supine Min A Supine to sit Min A  TRANSFERS: Assistive device utilized:  Environmental Consultant - 2 wheeled  Sit to stand: SBA Stand to sit: SBA Chair to chair: SBA Floor: NT  GAIT: Gait pattern: decreased step length- Left and decreased stance time- Right Distance walked: into clinic x 150' Assistive device utilized: Environmental Consultant - 2 wheeled Level of assistance: SBA Comments:   FUNCTIONAL TESTS:  TBD  PATIENT SURVEYS:  DHI: THE DIZZINESS HANDICAP INVENTORY (DHI)  P1. Does looking up increase your problem? 2 = Sometimes  E2. Because of your problem, do you feel frustrated? 2 = Sometimes  F3. Because of your problem, do you restrict your travel for business or recreation?  0 = No  P4. Does walking down the aisle of a supermarket increase your problems?  0 = No  F5. Because of your problem, do you have difficulty getting into or out of bed?  4 = Yes  F6. Does your problem significantly restrict your participation in social activities, such as going out to dinner, going to the movies, dancing, or going to parties? 0 = No  F7. Because of your problem, do you have difficulty reading?  0 = No  P8. Does performing more ambitious activities such as sports, dancing, household chores (sweeping or putting dishes away) increase your problems?  2 = Sometimes  E9. Because of your problem, are you afraid to leave your home without having without having someone accompany you?  0 = No  E10. Because of your problem have you been embarrassed in front of others?  0 = No  P11. Do quick movements of your head increase your problem?  0 = No  F12. Because of your problem, do you avoid heights?  0 = No  P13. Does turning over in bed increase your problem?  4 = Yes  F14. Because of your problem, is it difficult for you to do strenuous homework or yard work? 2 = Sometimes  E15. Because of your problem, are you afraid people may think you are intoxicated? 0 = No  F16. Because of your problem, is it difficult for you to go for a walk by yourself?  0 = No  P17. Does walking down a sidewalk increase your  problem?  0 = No  E18.Because of your problem, is it difficult for you to concentrate 0 = No  F19. Because of your problem, is it difficult for you to walk around your house in the dark? 2 = Sometimes  E20. Because of your problem, are you afraid to stay home alone?  0 = No  E21. Because of your problem, do you feel handicapped? 0 = No  E22. Has the problem placed stress on your relationships with members of your family or friends? 0 = No  E23. Because of your problem, are you depressed?  0 = No  F24. Does your problem interfere with your job or household responsibilities?  2 = Sometimes  P25. Does bending over increase your problem?  2 = Sometimes  TOTAL 22    DHI Scoring Instructions  The patient is asked to answer each question as it pertains to dizziness or unsteadiness problems, specifically  considering their condition during the last month. Questions are designed to incorporate functional (F), physical  (P), and emotional (E) impacts on disability.   Scores greater than 10 points should be referred to balance specialists for further evaluation.   16-34 Points (mild handicap)  36-52 Points (moderate handicap)  54+ Points (severe handicap)  Minimally Detectable Change: 17 points ([address] [place], 1990)  Wiggins, G. SHAUNNA. and [employer], [employer]. [employer] (1990). The development of the Dizziness Handicap Inventory. Archives of Otolaryngology - Head and Neck Surgery 116(4): [id number].   VESTIBULAR ASSESSMENT:  GENERAL OBSERVATION:    SYMPTOM BEHAVIOR:  Subjective history: see above note  Non-Vestibular symptoms: none  Type of dizziness: Spinning/Vertigo and Funny feeling in the head  Frequency: whenever getting up/down from bed  Duration: several minutes to an hour  Aggravating factors: Induced by position change: lying supine, rolling to the right, rolling to the left, and supine to sit  Relieving factors: head stationary  Progression of symptoms: worse  OCULOMOTOR EXAM:  Ocular  Alignment: normal  Ocular ROM: No Limitations  Spontaneous Nystagmus: absent  Gaze-Induced Nystagmus: left beating with left gaze  Smooth Pursuits: saccades  Saccades: extra eye movements  Convergence/Divergence:  cm   Cover-cross-cover  test: NT  VESTIBULAR - OCULAR REFLEX:   Slow VOR: Comment: some nystagmus slow r, fast L  VOR Cancellation: Unable to Maintain Gaze  Head-Impulse Test:  NT  Dynamic Visual Acuity: NT   POSITIONAL TESTING: Right Dix-Hallpike: no obvious nystagmus, but dizziness Left Dix-Hallpike: no noted nystagmus, a little dizzy  MOTION SENSITIVITY:  Motion Sensitivity Quotient Intensity: 0 = none, 1 = Lightheaded, 2 = Mild, 3 = Moderate, 4 = Severe, 5 = Vomiting  Intensity  1. Sitting to supine   2. Supine to L side   3. Supine to R side   4. Supine to sitting   5. L Hallpike-Dix   6. Up from L    7. R Hallpike-Dix   8. Up from R    9. Sitting, head tipped to L knee   10. Head up from L knee   11. Sitting, head tipped to R knee   12. Head up from R knee   13. Sitting head turns x5   14.Sitting head nods x5   15. In stance, 180 turn to L    16. In stance, 180 turn to R     OTHOSTATICS: not done  FUNCTIONAL GAIT: TBD                                                                                                                             TREATMENT DATE:  09/17/24 Modified CRT for R ear x 2.  Patient is seated on mat table with R foot propped on stool for the maneuver.  She is able to lie back over 2 pillows for initial phase.   Then able to roll over to the L side with assist for her RLE due to the recent THA.        Canalith Repositioning:  Epley Right: Number of Reps: 2 Gaze Adaptation:   Habituation:    PATIENT EDUCATION: Education details: avoiding looking down next 24 hours, sleeping in recliner Person educated: Patient Education method: Explanation Education comprehension: verbalized understanding  HOME EXERCISE  PROGRAM:  GOALS: Goals reviewed with patient? Yes  SHORT TERM GOALS: Target date: 10/15/24  Patient will report 25% subjective improvement in dizziness Baseline: Goal status: INITIAL  2.  Patient will be able to transfer sit to and from supine independently Baseline: min assist Goal status: INITIAL  3.  Patient will be independent with advanced HEP  Baseline:  no HEP provided yet  Goal status:  INITIAL  LONG TERM GOALS: Target date: 11/12/24  Patient will report 75-100% subjective improvement in dizziness Baseline:  Goal status: INITIAL  2.  Patient will be able to get up and down from bed and roll to either side with min to no symptoms of dizziness Baseline:  Goal status: INITIAL  3.  Patient will ambulate with least assistive device  independently with no pain and no LOB Baseline: walker dependent with SBA in community Goal status: INITIAL  4.  Patient will  score >/= 19 on DGI to decrease fall risk Baseline: TBD Goal status: INITIAL  5.  DHI outcome score will improve to </= 5 to demonstrate improved QOL  Baseline:  22  Goal status:  INITIAL  ASSESSMENT:  CLINICAL IMPRESSION: Patient is a 63 y.o.  who was seen today for physical therapy evaluation and treatment for vertigo.   She reports having difficulty with getting up and down out of bed due to the room spinning.   States room is still spinning after she sits EOB for a while and that she falls to the L against the wall when she finally does get up to go the bathroom.   She has not fallen to the ground yet, but is afraid that she might.   She just had a R THA about a month ago so she does not need to fall.   She reports the problem started almost a year ago when she had water put into her ears by an audiologist.  She has had the vertigo off and on since that time, but that has gotten much worse in the last month.   She is using a walker for balance and is some unsteady with turning and direction changes.  Gait is slow  and guarded, but she states she does not get dizzy walking.   She does have some difficulty with VOR cancellation and with smooth pursuits and nystagmus is noted with both of these tests.   She has a positive hallpike Dix especially to the R.   CRT is done for R ear today.   She may also have a hypofunction problem as well.   PT is necessary for dizziness, HEP and balance deficits.   She is agreeable to the POC  OBJECTIVE IMPAIRMENTS: difficulty walking, dizziness, and pain.   ACTIVITY LIMITATIONS: bending, sleeping, transfers, and locomotion level  PARTICIPATION LIMITATIONS: cleaning, laundry, and community activity  PERSONAL FACTORS: Age, Fitness, and 1-2 comorbidities: OA, DM, moderate asthma, deafness, recent anterior THA by Dr Jerri, are also affecting patient's functional outcome.   REHAB POTENTIAL: Good  CLINICAL DECISION MAKING: Evolving/moderate complexity  EVALUATION COMPLEXITY: Moderate   PLAN:  PT FREQUENCY: 1-2x/week  PT DURATION: 8 weeks  PLANNED INTERVENTIONS: 97164- PT Re-evaluation, 97750- Physical Performance Testing, 97110-Therapeutic exercises, 97530- Therapeutic activity, [id number]- Neuromuscular re-education, 97535- Self Care, [id number]- Manual therapy, [id number]- Gait training, [phone]- Canalith repositioning, Patient/Family education, and Stair training  PLAN FOR NEXT SESSION: Check Gufoni and Hallpike tests and treat accordingly.   Do some gaze stabilization and standing balance activities   Riyan Haile, PT 09/21/2024, 9:49 PM

## 2024-09-17 ENCOUNTER — Encounter (HOSPITAL_COMMUNITY): Payer: Self-pay

## 2024-09-17 ENCOUNTER — Encounter: Payer: Self-pay | Admitting: Rehabilitation

## 2024-09-17 ENCOUNTER — Ambulatory Visit: Attending: Medical | Admitting: Rehabilitation

## 2024-09-17 DIAGNOSIS — R42 Dizziness and giddiness: Secondary | ICD-10-CM | POA: Insufficient documentation

## 2024-09-17 DIAGNOSIS — H8112 Benign paroxysmal vertigo, left ear: Secondary | ICD-10-CM | POA: Diagnosis not present

## 2024-09-17 DIAGNOSIS — M25511 Pain in right shoulder: Secondary | ICD-10-CM | POA: Diagnosis not present

## 2024-09-17 DIAGNOSIS — M25551 Pain in right hip: Secondary | ICD-10-CM | POA: Insufficient documentation

## 2024-09-17 DIAGNOSIS — M6281 Muscle weakness (generalized): Secondary | ICD-10-CM | POA: Diagnosis not present

## 2024-09-17 DIAGNOSIS — M25561 Pain in right knee: Secondary | ICD-10-CM | POA: Diagnosis not present

## 2024-09-17 DIAGNOSIS — R269 Unspecified abnormalities of gait and mobility: Secondary | ICD-10-CM | POA: Insufficient documentation

## 2024-09-17 DIAGNOSIS — R293 Abnormal posture: Secondary | ICD-10-CM | POA: Diagnosis not present

## 2024-09-17 DIAGNOSIS — R2689 Other abnormalities of gait and mobility: Secondary | ICD-10-CM | POA: Insufficient documentation

## 2024-09-18 ENCOUNTER — Telehealth: Payer: Self-pay

## 2024-09-18 ENCOUNTER — Ambulatory Visit (HOSPITAL_COMMUNITY)
Admission: RE | Admit: 2024-09-18 | Discharge: 2024-09-18 | Disposition: A | Discharge status: Home / Self Care | Source: Ambulatory Visit | Attending: Orthopaedic Surgery | Admitting: Orthopaedic Surgery

## 2024-09-18 DIAGNOSIS — Z96641 Presence of right artificial hip joint: Secondary | ICD-10-CM | POA: Diagnosis not present

## 2024-09-18 NOTE — Telephone Encounter (Signed)
 FYI  Per Devere with [facility], patient is Negative for DVT right LE.

## 2024-09-18 NOTE — Telephone Encounter (Signed)
 thanks

## 2024-09-22 ENCOUNTER — Other Ambulatory Visit: Payer: Self-pay | Admitting: Physician Assistant

## 2024-09-22 ENCOUNTER — Telehealth: Payer: Self-pay | Admitting: Physician Assistant

## 2024-09-22 ENCOUNTER — Telehealth: Payer: Self-pay | Admitting: Orthopaedic Surgery

## 2024-09-22 MED ORDER — METHOCARBAMOL 500 MG PO TABS: 500.0000 mg | ORAL_TABLET | Freq: Two times a day (BID) | ORAL | 0 refills | Status: AC | PRN

## 2024-09-22 MED ORDER — HYDROCODONE-ACETAMINOPHEN 5-325 MG PO TABS: 1.0000 | ORAL_TABLET | Freq: Two times a day (BID) | ORAL | 0 refills | Status: DC | PRN

## 2024-09-22 NOTE — Telephone Encounter (Signed)
 Pt called saying that they need a refill of Methocarbamol  750 mg tab. Pharmacy [facility]. Pt call back number is [phone]. She has another medication, Hydrocodone  Acetamin 500 -325 mg.  Another medicine Aspirin  80 mg chewable tablets. Same pharmacy.she would like to have a call back when filled please.

## 2024-09-22 NOTE — Telephone Encounter (Signed)
 Yes, I realized when I was taking the message that it would be hard to follow, but the person that was giving me the info was all over the place with the meds. She only mentioned the ones that I listed as the ones that needed refilling. Hope this helps.

## 2024-09-22 NOTE — Telephone Encounter (Signed)
 Somewhat hard to follow this message as I am not sure if she is also needing refills on norco/aspirin ?  I went ahead and sent in robaxin  and norco just in case.  Aspirin  only needed for 6 weeks post-op

## 2024-09-23 ENCOUNTER — Ambulatory Visit

## 2024-09-23 NOTE — Telephone Encounter (Signed)
 Called patient no answer. Sign language interpreter left Message.

## 2024-09-24 ENCOUNTER — Encounter: Payer: Self-pay | Admitting: Family Medicine

## 2024-09-24 ENCOUNTER — Other Ambulatory Visit: Payer: Self-pay

## 2024-09-24 ENCOUNTER — Ambulatory Visit

## 2024-09-24 ENCOUNTER — Ambulatory Visit: Admitting: Family Medicine

## 2024-09-24 VITALS — BP 110/82 | HR 76 | Ht 64.0 in | Wt 210.0 lb

## 2024-09-24 DIAGNOSIS — M19011 Primary osteoarthritis, right shoulder: Secondary | ICD-10-CM | POA: Diagnosis not present

## 2024-09-24 DIAGNOSIS — G8929 Other chronic pain: Secondary | ICD-10-CM

## 2024-09-24 DIAGNOSIS — M25561 Pain in right knee: Secondary | ICD-10-CM

## 2024-09-24 DIAGNOSIS — M1711 Unilateral primary osteoarthritis, right knee: Secondary | ICD-10-CM | POA: Diagnosis not present

## 2024-09-24 DIAGNOSIS — M25511 Pain in right shoulder: Secondary | ICD-10-CM

## 2024-09-24 NOTE — Patient Instructions (Signed)
 Thank you for coming in today.   Please get an Xray today before you leave   A referral for physical therapy has been submitted. A representative from the physical therapy office will contact you to coordinate scheduling after confirming your benefits with your insurance provider. If you do not hear from the physical therapy office within the next 1-2 weeks, please let us know.   See you back in 1 month

## 2024-09-24 NOTE — Progress Notes (Signed)
 I, Leotis Batter, CMA acting as a scribe for Artist Lloyd, MD.  Rhonda Morrow is a 63 y.o. female who presents to [facility] today for arm and knee pain. Pt was previously seen by Dr. Lloyd on 03/17/24 for R hip pain. R Hip replacement done on 08/14/24.   Today, pt c/o arm and knee pain. Pt locates pain to right arm and right knee. Sx started suddenly. Has tried massage with minimal relief. Right knee present x 2 weeks, locates pain distal to patella. Locates right arm pain to the shoulder, deep in the joint. No known injury.  Occasional weakness in the R LE. Sx worse at the end of the day and first thing in the morning. Has tried topical analgesic with no relief.   Pertinent review of systems: No fevers or chills  Relevant historical information: History of right hip replacement about 5 weeks ago.   Exam:  BP 110/82   Pulse 76   Ht 5' 4 (1.626 m)   Wt 210 lb (95.3 kg)   LMP 05/20/2012   SpO2 98%   BMI 36.05 kg/m  General: Well Developed, well nourished, and in no acute distress.   MSK: Right shoulder normal appearing normal motion pain with abduction.  Strength is intact. Positive Hawkins and Neer's test.  Right knee swelling and tenderness posterior lateral knee with some bruising.  Normal knee motion.  Intact strength.    Lab and Radiology Results  X-ray images right shoulder and right knee obtained today personally and independently interpreted.  Right shoulder: Mild glenohumeral DJD.  Moderate AC DJD.  No acute fractures are visible.  Right knee: Mild medial DJD.  No acute fractures are visible.  Await formal radiology review  Patient had venous duplex ultrasound to assess for DVT on September 18, 2024 that did not show any blood clot.  Assessment and Plan: 63 y.o. female with right shoulder and right knee pain.  Shoulder pain due to rotator cuff impingement or irritation secondary to increased use of arm following hip  replacement.  Knee pain due primarily to bruising.  She may have some bruising heading downstream following her hip replacement.  Plan for compression.  Physical therapy will be quite helpful.  Patient is already scheduled for vestibular physical therapy in [facility].  Will add more conventional PT for the shoulder and the knee.  Recheck in 1 month.   PDMP not reviewed this encounter. Orders Placed This Encounter  Procedures   DG Knee AP/LAT W/Sun[place] Right    Standing Status:   Future    Number of Occurrences:   1    Expiration Date:   10/24/2024    Reason for Exam (SYMPTOM  OR DIAGNOSIS REQUIRED):   right knee pain    Preferred imaging location?:   [facility]   DG Shoulder Right    Standing Status:   Future    Number of Occurrences:   1    Expiration Date:   09/24/2025    Reason for Exam (SYMPTOM  OR DIAGNOSIS REQUIRED):   right shoulder pain    Preferred imaging location?:   [facility]   Ambulatory referral to Physical Therapy    Referral Priority:   Routine    Referral Type:   Physical Medicine    Referral Reason:   Specialty Services Required    Requested Specialty:   Physical Therapy    Number of Visits Requested:   1   No  orders of the defined types were placed in this encounter.    Discussed warning signs or symptoms. Please see discharge instructions. Patient expresses understanding.   The above documentation has been reviewed and is accurate and complete Artist Lloyd, M.D.

## 2024-09-25 ENCOUNTER — Other Ambulatory Visit: Payer: Self-pay | Admitting: Family Medicine

## 2024-09-25 ENCOUNTER — Ambulatory Visit

## 2024-09-25 DIAGNOSIS — M25511 Pain in right shoulder: Secondary | ICD-10-CM | POA: Diagnosis not present

## 2024-09-25 DIAGNOSIS — R269 Unspecified abnormalities of gait and mobility: Secondary | ICD-10-CM

## 2024-09-25 DIAGNOSIS — M6281 Muscle weakness (generalized): Secondary | ICD-10-CM

## 2024-09-25 DIAGNOSIS — M25551 Pain in right hip: Secondary | ICD-10-CM

## 2024-09-25 DIAGNOSIS — R293 Abnormal posture: Secondary | ICD-10-CM | POA: Diagnosis not present

## 2024-09-25 DIAGNOSIS — I10 Essential (primary) hypertension: Secondary | ICD-10-CM

## 2024-09-25 DIAGNOSIS — M25561 Pain in right knee: Secondary | ICD-10-CM | POA: Diagnosis not present

## 2024-09-25 DIAGNOSIS — R42 Dizziness and giddiness: Secondary | ICD-10-CM | POA: Diagnosis not present

## 2024-09-25 DIAGNOSIS — R2689 Other abnormalities of gait and mobility: Secondary | ICD-10-CM

## 2024-09-25 DIAGNOSIS — R32 Unspecified urinary incontinence: Secondary | ICD-10-CM

## 2024-09-25 DIAGNOSIS — H8112 Benign paroxysmal vertigo, left ear: Secondary | ICD-10-CM

## 2024-09-25 NOTE — Therapy (Signed)
 OUTPATIENT PHYSICAL THERAPY VESTIBULAR EVALUATION     Patient Name: Rhonda Morrow MRN: [id number] DOB:[birth year], 63 y.o., female Today's Date: 09/26/2024  END OF SESSION:  PT End of Session - 09/25/24 0816     Visit Number 2    Date for Recertification  11/12/24    PT Start Time 1410    PT Stop Time 1447    PT Time Calculation (min) 37 min    Activity Tolerance Patient tolerated treatment well;No increased pain    Behavior During Therapy WFL for tasks assessed/performed           Past Medical History:  Diagnosis Date   Arthritis    Asthma    Complication of anesthesia    one time woke up and was vey scared,17 yrs ago   Deaf    Depression    Diabetes mellitus without complication (HCC)    GERD (gastroesophageal reflux disease)    Hypertension    Left groin pain    Nausea with vomiting 02/19/2014   Thyroid  disease    Past Surgical History:  Procedure Laterality Date   CARDIAC CATHETERIZATION     CESAREAN SECTION     CHOLECYSTECTOMY N/A 06/20/2016   Procedure: LAPAROSCOPIC CHOLECYSTECTOMY;  Surgeon: Donnice Bury, MD;  Location: [facility] OR;  Service: General;  Laterality: N/A;   ESOPHAGEAL MANOMETRY N/A 09/29/2013   Procedure: ESOPHAGEAL MANOMETRY (EM);  Surgeon: Toribio SHAUNNA Cedar, MD;  Location: [facility];  Service: Endoscopy;  Laterality: N/A;   TOTAL HIP ARTHROPLASTY Left 02/21/2017   Procedure: LEFT TOTAL HIP ARTHROPLASTY ANTERIOR APPROACH;  Surgeon: Kay CHRISTELLA Cummins, MD;  Location: [facility] OR;  Service: Orthopedics;  Laterality: Left;   TOTAL HIP ARTHROPLASTY Right 08/14/2024   Procedure: ARTHROPLASTY, HIP, TOTAL, ANTERIOR APPROACH;  Surgeon: Cummins Kay CHRISTELLA, MD;  Location: [facility] ORS;  Service: Orthopedics;  Laterality: Right;   Patient Active Problem List   Diagnosis Date Noted   Status post total replacement of right hip 08/14/2024   Gout 06/02/2024   Hearing loss 06/02/2024   Mastoid disorder, unspecified laterality 04/01/2024   Irritable bowel syndrome with both  constipation and diarrhea 07/20/2023   Urinary incontinence 01/31/2023   Vaginal itching 01/31/2023   Influenza 11/20/2022   Postmenopausal bleeding 08/18/2022   Acute non-recurrent pansinusitis 06/18/2022   Leg cramps 03/08/2022   Muscle spasm of right lower extremity 03/08/2022   Unilateral primary osteoarthritis, right hip 03/01/2022   ACE inhibitor intolerance 01/19/2022   Weakness 12/22/2021   Left lower quadrant abdominal pain 12/22/2021   Slow transit constipation 12/22/2021   Class 3 severe obesity due to excess calories without serious comorbidity with body mass index (BMI) of 40.0 to 44.9 in adult (HCC) 07/05/2021   Restless leg syndrome 07/05/2021   Chronic seasonal allergic rhinitis due to pollen 07/05/2021   Psychophysiological insomnia 07/05/2021   Non-compliance 07/05/2021   COVID-19 07/05/2021   Right wrist pain 06/05/2021   Acute pain of left shoulder 01/24/2021   Acute pain of right shoulder 01/24/2021   Foot pain, bilateral 01/24/2021   Dyspepsia 01/24/2021   Preop cardiovascular exam 01/24/2021   Right hand pain 12/29/2020   OSA (obstructive sleep apnea) 09/12/2020   Nocturia more than twice per night 08/03/2020   Non-restorative sleep 08/03/2020   Leg cramping 08/03/2020   Hyperlipidemia associated with type 2 diabetes mellitus (HCC) 07/25/2020   Large breasts 07/22/2020   RLS (restless legs syndrome) 05/17/2020   Cough 04/01/2020   Asthma, moderate persistent, poorly-controlled 04/01/2020   Medication management  04/01/2020   Menopausal symptoms 03/29/2020   Hot flashes 03/29/2020   Chronic right shoulder pain 08/11/2019   Acute non-recurrent maxillary sinusitis 08/11/2019   BMI 40.0-44.9, adult (HCC) 01/22/2019   Right lower quadrant abdominal pain 10/20/2018   Rectal pain 10/20/2018   Vitamin B 12 deficiency 03/28/2018   B12 deficiency 03/28/2018   DM (diabetes mellitus) type II uncontrolled, periph vascular disorder 03/28/2018   Hyperlipidemia  03/28/2018   Primary osteoarthritis of left hip 02/21/2017   Status post total hip replacement, left 02/21/2017   History of laparoscopic cholecystectomy 06/20/2016   Cerumen impaction 06/09/2016   Hypersomnia 05/16/2016   Knee pain, right 05/05/2016   RUQ pain 04/09/2016   Abnormal CT scan 03/28/2016   Dyspnea and respiratory abnormalities 03/15/2016   Asthma with acute exacerbation 03/15/2016   Asthma in adult 02/25/2016   DM (diabetes mellitus) type II controlled, neurological manifestation (HCC) 02/09/2016   Right hip pain 12/23/2015   Right hamstring muscle strain 11/18/2015   Abdominal pain, acute 09/01/2015   Acute asthma exacerbation 04/16/2015   Acute bronchitis 04/14/2015   Pap smear for cervical cancer screening 09/14/2014   Bed bug bite 05/06/2014   Allergic rhinitis 04/09/2014   Rectal itching 04/09/2014   Bladder spasm 04/09/2014   Knee pain, left 03/05/2014   Hypokalemia 02/19/2014   Nausea with vomiting 02/19/2014   Diabetes mellitus, type II (HCC) 02/19/2014   Edema 02/16/2014   Disorder of rotator cuff 11/12/2013   Routine general medical examination at a health care facility 08/20/2013   Gastroesophageal reflux disease 06/26/2013   Dysphagia, pharyngoesophageal phase 06/26/2013   Suprapubic abdominal pain 05/12/2013   Medication side effect 03/25/2013   Diarrhea 03/25/2013   Benign positional vertigo 01/30/2013   Traumatic hematoma of thigh 01/15/2013   HTN (hypertension) 09/02/2012   Dry skin 08/22/2012   External hemorrhoids 08/22/2012   Obesity 06/30/2012   Thyromegaly 05/22/2012   Back pain 05/22/2012   Elevated glucose 05/22/2012   Moderate persistent asthma 04/19/2012   Dyspnea 01/28/2012    PCP: Dr Antonio  REFERRING PROVIDER:Edward Saguier, Yolande Lloyd, MD  REFERRING DIAG: R42 (ICD-10-CM) - Vertigo/R knee and R shoulder pain  THERAPY DIAG:   Muscle weakness (generalized) - Plan: PT plan of care cert/re-cert  Other abnormalities  of gait and mobility - Plan: PT plan of care cert/re-cert  Abnormality of gait and mobility - Plan: PT plan of care cert/re-cert  Dizziness and giddiness  BPPV (benign paroxysmal positional vertigo), left  Abnormal posture - Plan: PT plan of care cert/re-cert  Right hip pain  Acute pain of right knee - Plan: PT plan of care cert/re-cert  Acute pain of right shoulder - Plan: PT plan of care cert/re-cert  ONSET DATE:   Rationale for Evaluation and Treatment: Rehabilitation  SUBJECTIVE:   SUBJECTIVE STATEMENT: 09/25/24: Pt 10 min late, communicated with sign language interpreter.  Reports marked R knee and shoulder pain.  Wants to have R knee evaluated toda 63 y/o patient referred to PT from PCP for vertigo.  Of note, the patient is deaf and communicates via sign language and an interpreter is present for this visit.     She reports she went to her audiologist almost a year ago and when they shot water in her ears she got very dizzy.   The symptoms went away, but have returned off and on since that time until recent week or so when they have become severe. Symptoms are worst when getting up from bed to  go to the bathroom at night.   She falls to the L.  She denies ever having this problem before going to the audiologist. She lives with her spouse who is also deaf.   Unknown how his health is.   Patient comes to clinic using a walker.    Of note, she had an anterior R THA a month ago by Dr Jerri, and has completed her [facility] PT for this issue.  She would like PT for this as well but we do not have any orders from Dr Jerri.   Will request orders per patient request  Pt accompanied by: self  PERTINENT HISTORY: OA, DM, moderate asthma, deafness, recent anterior THA by Dr Jerri,   PAIN:  Are you having pain? Yes: NPRS scale: 4/10 now Pain location: R hip anterior Pain description: achy Aggravating factors: lifting her leg into bed or lying down Relieving factors: nonoffending positions  PRECAUTIONS:  Anterior hip  RED FLAGS: None   WEIGHT BEARING RESTRICTIONS: No  FALLS: Has patient fallen in last 6 months? Yes. Number of falls 1  LIVING ENVIRONMENT: Lives with: lives with their family, lives with their spouse, and lives with their son Lives in: House/apartment Stairs: Yes: External: several steps; unknown Has following equipment at home: Walker - 2 wheeled  PLOF: Independent with gait  PATIENT GOALS: walk better, get rid of the dizziness  OBJECTIVE:  Note: Objective measures were completed at Evaluation unless otherwise noted.  DIAGNOSTIC FINDINGS: EXAM: MRI BRAIN WITHOUT CONTRAST 03/26/2024 09:24:46 PM   TECHNIQUE: Multiplanar multisequence MRI of the head/brain was performed without the administration of intravenous contrast.   COMPARISON: CT head without contrast 07/31/2019.   CLINICAL HISTORY: Vertigo.   FINDINGS:   BRAIN AND VENTRICLES: No acute infarct. No intracranial hemorrhage. No mass. No midline shift. No hydrocephalus. The sella is unremarkable. Normal flow voids. Scattered subcortical T2 hyperintensities bilaterally are moderately advanced for age.   ORBITS: No acute abnormality.   SINUSES AND MASTOIDS: Mastoid effusion is again noted. No obstructing nasopharyngeal lesion is present.   BONES AND SOFT TISSUES: Normal marrow signal. No acute soft tissue abnormality. Mild degenerative changes are present in the upper cervical spine.   IMPRESSION: 1. No acute intracranial abnormality related to vertigo. 2. Moderately advanced scattered subcortical T2 hyperintensities bilaterally for age. This likely reflects the sequelae of chronic microvascular ischemia. 3. Mastoid effusion, again noted. No obstructing nasopharyngeal lesion.   Electronically signed by: Lonni Necessary MD 03/26/2024 09:34 PM EDT RP Workstation: [id number]  COGNITION: Overall cognitive status: Within functional limits for tasks  assessed   SENSATION: WFL  EDEMA:  Mild R thigh  MUSCLE TONE:    DTRs:    POSTURE:  rounded shoulders and forward head  Cervical ROM:    Active A/PROM (deg) eval  Flexion 100%  Extension 50%  Right lateral flexion 50%  Left lateral flexion 65%  Right rotation 80%  Left rotation 80%  (Blank rows = not tested)  STRENGTH: nt  LOWER EXTREMITY MMT:   MMT Right eval Left eval  Hip flexion    Hip abduction    Hip adduction    Hip internal rotation    Hip external rotation    Knee flexion    Knee extension    Ankle dorsiflexion    Ankle plantarflexion    Ankle inversion    Ankle eversion    (Blank rows = not tested)  BED MOBILITY:  Sit to supine Min A Supine to sit Min A  TRANSFERS: Assistive device utilized: Environmental Consultant - 2 wheeled  Sit to stand: SBA Stand to sit: SBA Chair to chair: SBA Floor: NT  GAIT: Gait pattern: decreased step length- Left and decreased stance time- Right Distance walked: into clinic x 150' Assistive device utilized: Environmental Consultant - 2 wheeled Level of assistance: SBA Comments:   FUNCTIONAL TESTS:  TBD  PATIENT SURVEYS:  DHI: THE DIZZINESS HANDICAP INVENTORY (DHI)  P1. Does looking up increase your problem? 2 = Sometimes  E2. Because of your problem, do you feel frustrated? 2 = Sometimes  F3. Because of your problem, do you restrict your travel for business or recreation?  0 = No  P4. Does walking down the aisle of a supermarket increase your problems?  0 = No  F5. Because of your problem, do you have difficulty getting into or out of bed?  4 = Yes  F6. Does your problem significantly restrict your participation in social activities, such as going out to dinner, going to the movies, dancing, or going to parties? 0 = No  F7. Because of your problem, do you have difficulty reading?  0 = No  P8. Does performing more ambitious activities such as sports, dancing, household chores (sweeping or putting dishes away) increase your problems?  2  = Sometimes  E9. Because of your problem, are you afraid to leave your home without having without having someone accompany you?  0 = No  E10. Because of your problem have you been embarrassed in front of others?  0 = No  P11. Do quick movements of your head increase your problem?  0 = No  F12. Because of your problem, do you avoid heights?  0 = No  P13. Does turning over in bed increase your problem?  4 = Yes  F14. Because of your problem, is it difficult for you to do strenuous homework or yard work? 2 = Sometimes  E15. Because of your problem, are you afraid people may think you are intoxicated? 0 = No  F16. Because of your problem, is it difficult for you to go for a walk by yourself?  0 = No  P17. Does walking down a sidewalk increase your problem?  0 = No  E18.Because of your problem, is it difficult for you to concentrate 0 = No  F19. Because of your problem, is it difficult for you to walk around your house in the dark? 2 = Sometimes  E20. Because of your problem, are you afraid to stay home alone?  0 = No  E21. Because of your problem, do you feel handicapped? 0 = No  E22. Has the problem placed stress on your relationships with members of your family or friends? 0 = No  E23. Because of your problem, are you depressed?  0 = No  F24. Does your problem interfere with your job or household responsibilities?  2 = Sometimes  P25. Does bending over increase your problem?  2 = Sometimes  TOTAL 22    DHI Scoring Instructions  The patient is asked to answer each question as it pertains to dizziness or unsteadiness problems, specifically  considering their condition during the last month. Questions are designed to incorporate functional (F), physical  (P), and emotional (E) impacts on disability.   Scores greater than 10 points should be referred to balance specialists for further evaluation.   16-34 Points (mild handicap)  36-52 Points (moderate handicap)  54+ Points (severe  handicap)  Minimally Detectable Change: 17 points Macarthur &  Ethyl, 1990)  Blades, JUDITHANN GLADE and Whittemore, C. W. (1990). The development of the Dizziness Handicap Inventory. Archives of Otolaryngology - Head and Neck Surgery 116(4): [id number].   VESTIBULAR ASSESSMENT:  GENERAL OBSERVATION:    SYMPTOM BEHAVIOR:  Subjective history: see above note  Non-Vestibular symptoms: none  Type of dizziness: Spinning/Vertigo and Funny feeling in the head  Frequency: whenever getting up/down from bed  Duration: several minutes to an hour  Aggravating factors: Induced by position change: lying supine, rolling to the right, rolling to the left, and supine to sit  Relieving factors: head stationary  Progression of symptoms: worse  OCULOMOTOR EXAM:  Ocular Alignment: normal  Ocular ROM: No Limitations  Spontaneous Nystagmus: absent  Gaze-Induced Nystagmus: left beating with left gaze  Smooth Pursuits: saccades  Saccades: extra eye movements  Convergence/Divergence:  cm   Cover-cross-cover test: NT  VESTIBULAR - OCULAR REFLEX:   Slow VOR: Comment: some nystagmus slow r, fast L  VOR Cancellation: Unable to Maintain Gaze  Head-Impulse Test:  NT  Dynamic Visual Acuity: NT   POSITIONAL TESTING: Right Dix-Hallpike: no obvious nystagmus, but dizziness Left Dix-Hallpike: no noted nystagmus, a little dizzy  MOTION SENSITIVITY:  Motion Sensitivity Quotient Intensity: 0 = none, 1 = Lightheaded, 2 = Mild, 3 = Moderate, 4 = Severe, 5 = Vomiting  Intensity  1. Sitting to supine   2. Supine to L side   3. Supine to R side   4. Supine to sitting   5. L Hallpike-Dix   6. Up from L    7. R Hallpike-Dix   8. Up from R    9. Sitting, head tipped to L knee   10. Head up from L knee   11. Sitting, head tipped to R knee   12. Head up from R knee   13. Sitting head turns x5   14.Sitting head nods x5   15. In stance, 180 turn to L    16. In stance, 180 turn to R     OTHOSTATICS: not  done  FUNCTIONAL GAIT: TBD                                                                                                                             TREATMENT DATE:  09/25/24:  Abbreviated session due to 10 min late.  Evaluated R knee pain Gait with R walker, antalgic on R , initial contact on R forefoot, avoids heel strike, maintains R knee stiff at 20 degree bend.  able to walk through clinic up to 80' Lower Extremity Functional Score: 8 / 80 = 10.0 % MMT R LE : pain R post knee and 3+/5 R quads, hamstrings wfl ROM : R knee flexion 100, ext wnl Palpation: edematous R thigh extending from hip to R foot, more prominent edema R thigh noted Treatment: today focused on decreasing edema R LE, pt 4-5 weeks post op R THA, ant approach so edema expected Instructed in elevating  R LE with ankle over heart, for 15 to 20 min intervals Inst in R ankle pumps vs black t band 20 x while in elevated position Inst in supine R heel slides with sheet or theraband for assistance to improve movement R knee Inst in seated R knee long arc quads with black t band assistance 20x  Provided instruction with medbridge, see below  09/17/24 Modified CRT for R ear x 2.  Patient is seated on mat table with R foot propped on stool for the maneuver.  She is able to lie back over 2 pillows for initial phase.   Then able to roll over to the L side with assist for her RLE due to the recent THA.        Canalith Repositioning:  Epley Right: Number of Reps: 2 Gaze Adaptation:   Habituation:    PATIENT EDUCATION: Education details: avoiding looking down next 24 hours, sleeping in recliner Person educated: Patient Education method: Explanation Education comprehension: verbalized understanding  HOME EXERCISE PROGRAM: Access Code: [email or web address] URL: [email or web address] Date: 09/25/2024 Prepared by: Greig Credit  Exercises - Ankle Plantarflexion on Ball with Resistance  - 1 x daily - 7 x weekly - 3 sets -  10 reps - Supine Heel Slide with Strap  - 1 x daily - 7 x weekly - 3 sets - 10 reps - Seated Long Arc Quad with Strap  - 2 x daily - 7 x weekly - 3 sets - 10 reps GOALS: Goals reviewed with patient? Yes  SHORT TERM GOALS: Target date: 10/15/24  Patient will report 25% subjective improvement in dizziness Baseline: Goal status: INITIAL  2.  Patient will be able to transfer sit to and from supine independently Baseline: min assist Goal status: INITIAL  3.  Patient will be independent with advanced HEP  Baseline:  no HEP provided yet  Goal status:  INITIAL  LONG TERM GOALS: Target date: 11/12/24  Patient will report 75-100% subjective improvement in dizziness Baseline:  Goal status: INITIAL  2.  Patient will be able to get up and down from bed and roll to either side with min to no symptoms of dizziness Baseline:  Goal status: INITIAL  3.  Patient will ambulate with least assistive device  independently with no pain and no LOB Baseline: walker dependent with SBA in community Goal status: INITIAL  4.  Patient will score >/= 19 on DGI to decrease fall risk Baseline: TBD Goal status: INITIAL  5.  DHI outcome score will improve to </= 5 to demonstrate improved QOL  Baseline:  22  Goal status:  INITIAL  6.  New goal for R knee for 09/25/24: normal R quads strength and pain free  7.09/25/24:  Will evaluate shoulder and set goals in the next few visits.  ASSESSMENT:  CLINICAL IMPRESSION: 09/25/24:  Patient arrived 10 min late and had new orders for evaluate and treat for R knee and R shoulder pain. So evaluated her R knee as that issue was her chief complaint today.  Primary findings were edema more pronounced R thigh and extending to R foot, with pain and decreased R quads strength, affecting her gait efficiency.  She did improve during today's visit with her knee pain with light exercises designed to improve her R knee mobility and recruit her R LE musculature.  Would recommend  incorporating progressive R LE strengthening into current vertigo physical therapy plan of care.  We were unable to address her R shoulder pain today due to shortened  session, will evaluate as needed in further sessions. Her R shoulder pain correlates with the R THA so hopefully reducing her reliance on the walker will improve her R shoulder.   Initial evaluation: Patient is a 63 y.o.  who was seen today for physical therapy evaluation and treatment for vertigo.   She reports having difficulty with getting up and down out of bed due to the room spinning.   States room is still spinning after she sits EOB for a while and that she falls to the L against the wall when she finally does get up to go the bathroom.   She has not fallen to the ground yet, but is afraid that she might.   She just had a R THA about a month ago so she does not need to fall.   She reports the problem started almost a year ago when she had water put into her ears by an audiologist.  She has had the vertigo off and on since that time, but that has gotten much worse in the last month.   She is using a walker for balance and is some unsteady with turning and direction changes.  Gait is slow and guarded, but she states she does not get dizzy walking.   She does have some difficulty with VOR cancellation and with smooth pursuits and nystagmus is noted with both of these tests.   She has a positive hallpike Dix especially to the R.   CRT is done for R ear today.   She may also have a hypofunction problem as well.   PT is necessary for dizziness, HEP and balance deficits.   She is agreeable to the POC  OBJECTIVE IMPAIRMENTS: difficulty walking, dizziness, and pain.   ACTIVITY LIMITATIONS: bending, sleeping, transfers, and locomotion level  PARTICIPATION LIMITATIONS: cleaning, laundry, and community activity  PERSONAL FACTORS: Age, Fitness, and 1-2 comorbidities: OA, DM, moderate asthma, deafness, recent anterior THA by Dr Jerri, are also  affecting patient's functional outcome.   REHAB POTENTIAL: Good  CLINICAL DECISION MAKING: Evolving/moderate complexity  EVALUATION COMPLEXITY: Moderate   PLAN:  PT FREQUENCY: 1-2x/week  PT DURATION: 8 weeks  PLANNED INTERVENTIONS: 97164- PT Re-evaluation, 97750- Physical Performance Testing, 97110-Therapeutic exercises, 97530- Therapeutic activity, [id number]- Neuromuscular re-education, 97535- Self Care, [id number]- Manual therapy, [id number]- Gait training, [phone]- Canalith repositioning, Patient/Family education, and Stair training  PLAN FOR NEXT SESSION: Check Gufoni and Hallpike tests and treat accordingly.   Do some gaze stabilization and standing balance activities, evaluate R shoulder as needed, continue to gently progress R LE strengthening and stability   Brycen Bean L Yi Haugan, PT, DPT , OCS 09/26/2024, 8:31 AM

## 2024-09-26 ENCOUNTER — Other Ambulatory Visit: Payer: Self-pay

## 2024-09-29 ENCOUNTER — Ambulatory Visit: Payer: Self-pay | Admitting: Family Medicine

## 2024-09-29 NOTE — Progress Notes (Signed)
Right shoulder x-ray shows arthritis.

## 2024-09-29 NOTE — Progress Notes (Signed)
 Right knee x-ray shows arthritis

## 2024-09-30 ENCOUNTER — Other Ambulatory Visit (INDEPENDENT_AMBULATORY_CARE_PROVIDER_SITE_OTHER): Payer: Self-pay

## 2024-09-30 ENCOUNTER — Ambulatory Visit (INDEPENDENT_AMBULATORY_CARE_PROVIDER_SITE_OTHER): Admitting: Orthopaedic Surgery

## 2024-09-30 DIAGNOSIS — Z96641 Presence of right artificial hip joint: Secondary | ICD-10-CM

## 2024-09-30 MED ORDER — TRAMADOL HCL 50 MG PO TABS: 50.0000 mg | ORAL_TABLET | Freq: Every day | ORAL | 0 refills | Status: DC | PRN

## 2024-09-30 NOTE — Progress Notes (Signed)
 Post-Op Visit Note   Patient: Rhonda Morrow           Date of Birth: [birth year]           MRN: [id number] Visit Date: 09/30/2024 PCP: Antonio Cyndee Jamee JONELLE, DO   Assessment & Plan:  Chief Complaint:  Chief Complaint  Patient presents with   Right Hip - Follow-up    Right THA 08/14/2024   Visit Diagnoses:  1. Status post total replacement of right hip     Plan: History of Present Illness RENATHA ROSEN is a 63 year old female who presents for a six-week follow-up after right hip replacement surgery.  She reports significant right groin pain with a sensation of a lump and local swelling and is worried these may not be expected postoperatively.  She is in physical therapy focused on vertigo, right knee, and right shoulder. She confirms she previously completed home therapy for her right hip.  She is taking a prescribed muscle relaxer and requests a refill of hydrocodone  for pain control. She notes she has had both hips replaced and that prior x-rays showed similar appearance of both sides.  Physical Exam MUSCULOSKELETAL: Right hip incision well-healed, no signs of infection.  Fluid ROM.  Assessment and Plan Status post right total hip arthroplasty Six weeks post right total hip arthroplasty with expected postoperative pain and swelling. X-ray satisfactory, indicating good healing. - Discontinued aspirin . - Continue muscle relaxer as prescribed. - Transitioned from hydrocodone  to tramadol  for pain management. - Provided a copy of the x-ray to her. - Scheduled follow-up appointment in six weeks.  Follow-Up Instructions: Return in about 6 weeks (around 11/11/2024) for with lindsey.   Orders:  Orders Placed This Encounter  Procedures   XR Pelvis 1-2 Views   No orders of the defined types were placed in this encounter.   Imaging: XR Pelvis 1-2 Views Result Date: 09/30/2024 Stable right total hip replacement without complications   PMFS History: Patient Active Problem  List   Diagnosis Date Noted   Status post total replacement of right hip 08/14/2024   Gout 06/02/2024   Hearing loss 06/02/2024   Mastoid disorder, unspecified laterality 04/01/2024   Irritable bowel syndrome with both constipation and diarrhea 07/20/2023   Urinary incontinence 01/31/2023   Vaginal itching 01/31/2023   Influenza 11/20/2022   Postmenopausal bleeding 08/18/2022   Acute non-recurrent pansinusitis 06/18/2022   Leg cramps 03/08/2022   Muscle spasm of right lower extremity 03/08/2022   Unilateral primary osteoarthritis, right hip 03/01/2022   ACE inhibitor intolerance 01/19/2022   Weakness 12/22/2021   Left lower quadrant abdominal pain 12/22/2021   Slow transit constipation 12/22/2021   Class 3 severe obesity due to excess calories without serious comorbidity with body mass index (BMI) of 40.0 to 44.9 in adult (HCC) 07/05/2021   Restless leg syndrome 07/05/2021   Chronic seasonal allergic rhinitis due to pollen 07/05/2021   Psychophysiological insomnia 07/05/2021   Non-compliance 07/05/2021   COVID-19 07/05/2021   Right wrist pain 06/05/2021   Acute pain of left shoulder 01/24/2021   Acute pain of right shoulder 01/24/2021   Foot pain, bilateral 01/24/2021   Dyspepsia 01/24/2021   Preop cardiovascular exam 01/24/2021   Right hand pain 12/29/2020   OSA (obstructive sleep apnea) 09/12/2020   Nocturia more than twice per night 08/03/2020   Non-restorative sleep 08/03/2020   Leg cramping 08/03/2020   Hyperlipidemia associated with type 2 diabetes mellitus (HCC) 07/25/2020   Large breasts 07/22/2020  RLS (restless legs syndrome) 05/17/2020   Cough 04/01/2020   Asthma, moderate persistent, poorly-controlled 04/01/2020   Medication management 04/01/2020   Menopausal symptoms 03/29/2020   Hot flashes 03/29/2020   Chronic right shoulder pain 08/11/2019   Acute non-recurrent maxillary sinusitis 08/11/2019   BMI 40.0-44.9, adult (HCC) 01/22/2019   Right lower quadrant  abdominal pain 10/20/2018   Rectal pain 10/20/2018   Vitamin B 12 deficiency 03/28/2018   B12 deficiency 03/28/2018   DM (diabetes mellitus) type II uncontrolled, periph vascular disorder 03/28/2018   Hyperlipidemia 03/28/2018   Primary osteoarthritis of left hip 02/21/2017   Status post total hip replacement, left 02/21/2017   History of laparoscopic cholecystectomy 06/20/2016   Cerumen impaction 06/09/2016   Hypersomnia 05/16/2016   Knee pain, right 05/05/2016   RUQ pain 04/09/2016   Abnormal CT scan 03/28/2016   Dyspnea and respiratory abnormalities 03/15/2016   Asthma with acute exacerbation 03/15/2016   Asthma in adult 02/25/2016   DM (diabetes mellitus) type II controlled, neurological manifestation (HCC) 02/09/2016   Right hip pain 12/23/2015   Right hamstring muscle strain 11/18/2015   Abdominal pain, acute 09/01/2015   Acute asthma exacerbation 04/16/2015   Acute bronchitis 04/14/2015   Pap smear for cervical cancer screening 09/14/2014   Bed bug bite 05/06/2014   Allergic rhinitis 04/09/2014   Rectal itching 04/09/2014   Bladder spasm 04/09/2014   Knee pain, left 03/05/2014   Hypokalemia 02/19/2014   Nausea with vomiting 02/19/2014   Diabetes mellitus, type II (HCC) 02/19/2014   Edema 02/16/2014   Disorder of rotator cuff 11/12/2013   Routine general medical examination at a health care facility 08/20/2013   Gastroesophageal reflux disease 06/26/2013   Dysphagia, pharyngoesophageal phase 06/26/2013   Suprapubic abdominal pain 05/12/2013   Medication side effect 03/25/2013   Diarrhea 03/25/2013   Benign positional vertigo 01/30/2013   Traumatic hematoma of thigh 01/15/2013   HTN (hypertension) 09/02/2012   Dry skin 08/22/2012   External hemorrhoids 08/22/2012   Obesity 06/30/2012   Thyromegaly 05/22/2012   Back pain 05/22/2012   Elevated glucose 05/22/2012   Moderate persistent asthma 04/19/2012   Dyspnea 01/28/2012   Past Medical History:  Diagnosis Date    Arthritis    Asthma    Complication of anesthesia    one time woke up and was vey scared,17 yrs ago   Deaf    Depression    Diabetes mellitus without complication (HCC)    GERD (gastroesophageal reflux disease)    Hypertension    Left groin pain    Nausea with vomiting 02/19/2014   Thyroid  disease     Family History  Problem Relation Age of Onset   Diabetes Mother    Heart disease Mother    Diabetes Father    Colon cancer Neg Hx    Colon polyps Neg Hx    Esophageal cancer Neg Hx    Rectal cancer Neg Hx    Stomach cancer Neg Hx     Past Surgical History:  Procedure Laterality Date   CARDIAC CATHETERIZATION     CESAREAN SECTION     CHOLECYSTECTOMY N/A 06/20/2016   Procedure: LAPAROSCOPIC CHOLECYSTECTOMY;  Surgeon: Donnice Bury, MD;  Location: [facility] OR;  Service: General;  Laterality: N/A;   ESOPHAGEAL MANOMETRY N/A 09/29/2013   Procedure: ESOPHAGEAL MANOMETRY (EM);  Surgeon: Toribio SHAUNNA Cedar, MD;  Location: [facility];  Service: Endoscopy;  Laterality: N/A;   TOTAL HIP ARTHROPLASTY Left 02/21/2017   Procedure: LEFT TOTAL HIP ARTHROPLASTY ANTERIOR APPROACH;  Surgeon: Kay CHRISTELLA Cummins, MD;  Location: [facility] OR;  Service: Orthopedics;  Laterality: Left;   TOTAL HIP ARTHROPLASTY Right 08/14/2024   Procedure: ARTHROPLASTY, HIP, TOTAL, ANTERIOR APPROACH;  Surgeon: Cummins Kay CHRISTELLA, MD;  Location: [facility] ORS;  Service: Orthopedics;  Laterality: Right;   Social History   Occupational History   Occupation: disabled  Tobacco Use   Smoking status: Never   Smokeless tobacco: Never  Vaping Use   Vaping status: Never Used  Substance and Sexual Activity   Alcohol  use: Yes    Alcohol /week: 1.0 standard drink of alcohol     Types: 1 Glasses of wine per week    Comment: occas   Drug use: No   Sexual activity: Not Currently

## 2024-10-01 ENCOUNTER — Ambulatory Visit: Admitting: Rehabilitation

## 2024-10-01 ENCOUNTER — Encounter: Payer: Self-pay | Admitting: Rehabilitation

## 2024-10-01 ENCOUNTER — Ambulatory Visit: Payer: Self-pay

## 2024-10-01 DIAGNOSIS — R2689 Other abnormalities of gait and mobility: Secondary | ICD-10-CM

## 2024-10-01 DIAGNOSIS — M6281 Muscle weakness (generalized): Secondary | ICD-10-CM | POA: Diagnosis not present

## 2024-10-01 DIAGNOSIS — R42 Dizziness and giddiness: Secondary | ICD-10-CM

## 2024-10-01 DIAGNOSIS — M25561 Pain in right knee: Secondary | ICD-10-CM | POA: Diagnosis not present

## 2024-10-01 DIAGNOSIS — M25511 Pain in right shoulder: Secondary | ICD-10-CM | POA: Diagnosis not present

## 2024-10-01 DIAGNOSIS — R269 Unspecified abnormalities of gait and mobility: Secondary | ICD-10-CM | POA: Diagnosis not present

## 2024-10-01 DIAGNOSIS — H8112 Benign paroxysmal vertigo, left ear: Secondary | ICD-10-CM | POA: Diagnosis not present

## 2024-10-01 DIAGNOSIS — R293 Abnormal posture: Secondary | ICD-10-CM | POA: Diagnosis not present

## 2024-10-01 DIAGNOSIS — M25551 Pain in right hip: Secondary | ICD-10-CM | POA: Diagnosis not present

## 2024-10-01 NOTE — Telephone Encounter (Signed)
 FYI Only or Action Required?: Action required by provider: clinical question for provider and update on patient condition.  Patient was last seen in primary care on 09/08/2024 by Almarie Waddell NOVAK, NP.  Called Nurse Triage reporting Cough.  Symptoms began several days ago.  Interventions attempted: Nothing.  Symptoms are: unchanged.  Triage Disposition: See Physician Within 24 Hours  Patient/caregiver understands and will follow disposition?: No, wishes to speak with PCP      Copied from CRM #[id number]. Topic: Clinical - Red Word Triage >> Oct 01, 2024  9:53 AM Viola FALCON wrote: Red Word that prompted transfer to Nurse Triage: Patient having cough, sore throat and inflammation in chest, yellow phlegm       Reason for Disposition  [1] Continuous (nonstop) coughing interferes with work or school AND [2] no improvement using cough treatment per Care Advice  Answer Assessment - Initial Assessment Questions No appointment's available. Patient requesting medication for her cough and sore throat. Please advise.     1. ONSET: When did the cough begin?      5 days ago  2. SEVERITY: How bad is the cough today?      Mild to moderate  3. SPUTUM: Describe the color of your sputum (e.g., none, dry cough; clear, white, yellow, green)     Yellow  4. HEMOPTYSIS: Are you coughing up any blood? If Yes, ask: How much? (e.g., flecks, streaks, tablespoons, etc.)     No 5. DIFFICULTY BREATHING: Are you having difficulty breathing? If Yes, ask: How bad is it? (e.g., mild, moderate, severe)      NO 6. FEVER: Do you have a fever? If Yes, ask: What is your temperature, how was it measured, and when did it start?     No 7. CARDIAC HISTORY: Do you have any history of heart disease? (e.g., heart attack, congestive heart failure)      No 8. LUNG HISTORY: Do you have any history of lung disease?  (e.g., pulmonary embolus, asthma, emphysema)     Yes 9. PE RISK FACTORS: Do you have  a history of blood clots? (or: recent major surgery, recent prolonged travel, bedridden)     No 10. OTHER SYMPTOMS: Do you have any other symptoms? (e.g., runny nose, wheezing, chest pain)       Sore throat  Protocols used: Cough - Acute Productive-A-AH

## 2024-10-01 NOTE — Telephone Encounter (Signed)
Everyone is full

## 2024-10-01 NOTE — Therapy (Signed)
 OUTPATIENT PHYSICAL THERAPY VESTIBULAR/SHOULDER/LE TREATMENT     Patient Name: Rhonda Morrow MRN: [id number] DOB:1961-10-31, 63 y.o., female Today's Date: 10/01/2024  END OF SESSION:  PT End of Session - 10/01/24 1319     Visit Number 3    Date for Recertification  11/12/24    Authorization Type [employer]    Authorization Time Period 6/48 units used    PT Start Time 1315    PT Stop Time 1359    PT Time Calculation (min) 44 min    Activity Tolerance Patient tolerated treatment well;No increased pain    Behavior During Therapy WFL for tasks assessed/performed           Past Medical History:  Diagnosis Date   Arthritis    Asthma    Complication of anesthesia    one time woke up and was vey scared,17 yrs ago   Deaf    Depression    Diabetes mellitus without complication (HCC)    GERD (gastroesophageal reflux disease)    Hypertension    Left groin pain    Nausea with vomiting 02/19/2014   Thyroid  disease    Past Surgical History:  Procedure Laterality Date   CARDIAC CATHETERIZATION     CESAREAN SECTION     CHOLECYSTECTOMY N/A 06/20/2016   Procedure: LAPAROSCOPIC CHOLECYSTECTOMY;  Surgeon: Donnice Bury, MD;  Location: [facility];  Service: General;  Laterality: N/A;   ESOPHAGEAL MANOMETRY N/A 09/29/2013   Procedure: ESOPHAGEAL MANOMETRY (EM);  Surgeon: Toribio SHAUNNA Cedar, MD;  Location: [facility];  Service: Endoscopy;  Laterality: N/A;   TOTAL HIP ARTHROPLASTY Left 02/21/2017   Procedure: LEFT TOTAL HIP ARTHROPLASTY ANTERIOR APPROACH;  Surgeon: Kay CHRISTELLA Cummins, MD;  Location: [facility];  Service: Orthopedics;  Laterality: Left;   TOTAL HIP ARTHROPLASTY Right 08/14/2024   Procedure: ARTHROPLASTY, HIP, TOTAL, ANTERIOR APPROACH;  Surgeon: Cummins Kay CHRISTELLA, MD;  Location: [facility];  Service: Orthopedics;  Laterality: Right;   Patient Active Problem List   Diagnosis Date Noted   Status post total replacement of right hip 08/14/2024   Gout 06/02/2024   Hearing loss 06/02/2024   Mastoid  disorder, unspecified laterality 04/01/2024   Irritable bowel syndrome with both constipation and diarrhea 07/20/2023   Urinary incontinence 01/31/2023   Vaginal itching 01/31/2023   Influenza 11/20/2022   Postmenopausal bleeding 08/18/2022   Acute non-recurrent pansinusitis 06/18/2022   Leg cramps 03/08/2022   Muscle spasm of right lower extremity 03/08/2022   Unilateral primary osteoarthritis, right hip 03/01/2022   ACE inhibitor intolerance 01/19/2022   Weakness 12/22/2021   Left lower quadrant abdominal pain 12/22/2021   Slow transit constipation 12/22/2021   Class 3 severe obesity due to excess calories without serious comorbidity with body mass index (BMI) of 40.0 to 44.9 in adult (HCC) 07/05/2021   Restless leg syndrome 07/05/2021   Chronic seasonal allergic rhinitis due to pollen 07/05/2021   Psychophysiological insomnia 07/05/2021   Non-compliance 07/05/2021   COVID-19 07/05/2021   Right wrist pain 06/05/2021   Acute pain of left shoulder 01/24/2021   Acute pain of right shoulder 01/24/2021   Foot pain, bilateral 01/24/2021   Dyspepsia 01/24/2021   Preop cardiovascular exam 01/24/2021   Right hand pain 12/29/2020   OSA (obstructive sleep apnea) 09/12/2020   Nocturia more than twice per night 08/03/2020   Non-restorative sleep 08/03/2020   Leg cramping 08/03/2020   Hyperlipidemia associated with type 2 diabetes mellitus (HCC) 07/25/2020   Large breasts 07/22/2020   RLS (restless legs syndrome) 05/17/2020  Cough 04/01/2020   Asthma, moderate persistent, poorly-controlled 04/01/2020   Medication management 04/01/2020   Menopausal symptoms 03/29/2020   Hot flashes 03/29/2020   Chronic right shoulder pain 08/11/2019   Acute non-recurrent maxillary sinusitis 08/11/2019   BMI 40.0-44.9, adult (HCC) 01/22/2019   Right lower quadrant abdominal pain 10/20/2018   Rectal pain 10/20/2018   Vitamin B 12 deficiency 03/28/2018   B12 deficiency 03/28/2018   DM (diabetes  mellitus) type II uncontrolled, periph vascular disorder 03/28/2018   Hyperlipidemia 03/28/2018   Primary osteoarthritis of left hip 02/21/2017   Status post total hip replacement, left 02/21/2017   History of laparoscopic cholecystectomy 06/20/2016   Cerumen impaction 06/09/2016   Hypersomnia 05/16/2016   Knee pain, right 05/05/2016   RUQ pain 04/09/2016   Abnormal CT scan 03/28/2016   Dyspnea and respiratory abnormalities 03/15/2016   Asthma with acute exacerbation 03/15/2016   Asthma in adult 02/25/2016   DM (diabetes mellitus) type II controlled, neurological manifestation (HCC) 02/09/2016   Right hip pain 12/23/2015   Right hamstring muscle strain 11/18/2015   Abdominal pain, acute 09/01/2015   Acute asthma exacerbation 04/16/2015   Acute bronchitis 04/14/2015   Pap smear for cervical cancer screening 09/14/2014   Bed bug bite 05/06/2014   Allergic rhinitis 04/09/2014   Rectal itching 04/09/2014   Bladder spasm 04/09/2014   Knee pain, left 03/05/2014   Hypokalemia 02/19/2014   Nausea with vomiting 02/19/2014   Diabetes mellitus, type II (HCC) 02/19/2014   Edema 02/16/2014   Disorder of rotator cuff 11/12/2013   Routine general medical examination at a health care facility 08/20/2013   Gastroesophageal reflux disease 06/26/2013   Dysphagia, pharyngoesophageal phase 06/26/2013   Suprapubic abdominal pain 05/12/2013   Medication side effect 03/25/2013   Diarrhea 03/25/2013   Benign positional vertigo 01/30/2013   Traumatic hematoma of thigh 01/15/2013   HTN (hypertension) 09/02/2012   Dry skin 08/22/2012   External hemorrhoids 08/22/2012   Obesity 06/30/2012   Thyromegaly 05/22/2012   Back pain 05/22/2012   Elevated glucose 05/22/2012   Moderate persistent asthma 04/19/2012   Dyspnea 01/28/2012    PCP: Dr Antonio  REFERRING PROVIDER:Edward Saguier, Yolande Lloyd, MD  REFERRING DIAG: R42 (ICD-10-CM) - Vertigo/R knee and R shoulder pain  THERAPY DIAG:    Muscle weakness (generalized)  Other abnormalities of gait and mobility  Abnormality of gait and mobility  Dizziness and giddiness  ONSET DATE:   Rationale for Evaluation and Treatment: Rehabilitation  SUBJECTIVE:   SUBJECTIVE STATEMENT: 09/25/24:  Patient states feels ok.  She wants to have her R shoulder evaluated today so the note below includes tests/measures for the L shoulder.   She has some difficulty telling how long it has been hurting and any precipitating factors.   However, any lifting of the L arm is painful per her report.   She reports difficulty reaching behind her back as well.   She saw Dr Lloyd a week ago and had R knee and shoulder Xrays that showed DJD.   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  Pain is intermittent and worse with movement, but she also gets some resting pain.    63 y/o patient referred to PT from PCP for vertigo.  Of note, the patient is deaf and communicates via sign language and an interpreter is present for this visit.     She reports she went to her audiologist almost a year ago and when they shot water in her ears  she got very dizzy.   The symptoms went away, but have returned off and on since that time until recent week or so when they have become severe. Symptoms are worst when getting up from bed to go to the bathroom at night.   She falls to the L.  She denies ever having this problem before going to the audiologist. She lives with her spouse who is also deaf.   Unknown how his health is.   Patient comes to clinic using a walker.    Of note, she had an anterior R THA a month ago by Dr Jerri, and has completed her [facility] PT for this issue.  She would like PT for this as well but we do not have any orders from Dr Jerri.   Will request orders per patient request  Pt accompanied by: self  PERTINENT HISTORY: OA, DM, moderate asthma, deafness, recent anterior THA by Dr Jerri,   PAIN:  Are you having pain? Yes: NPRS scale: 4/10 now Pain  location: R hip anterior Pain description: achy Aggravating factors: lifting her leg into bed or lying down Relieving factors: nonoffending positions  PRECAUTIONS: Anterior hip  RED FLAGS: None   WEIGHT BEARING RESTRICTIONS: No  FALLS: Has patient fallen in last 6 months? Yes. Number of falls 1  LIVING ENVIRONMENT: Lives with: lives with their family, lives with their spouse, and lives with their son Lives in: House/apartment Stairs: Yes: External: several steps; unknown Has following equipment at home: Walker - 2 wheeled  PLOF: Independent with gait  PATIENT GOALS: walk better, get rid of the dizziness  OBJECTIVE:  Note: Objective measures were completed at Evaluation unless otherwise noted.  DIAGNOSTIC FINDINGS: EXAM: EXAM: 1 VIEW(S) XRAY OF THE RIGHT SHOULDER 09/24/2024 12:01:25 PM   COMPARISON: None available.   CLINICAL HISTORY: right shoulder pain   FINDINGS:   BONES AND JOINTS: No acute fracture or dislocation. Degenerative changes of the acromioclavicular and glenohumeral joints   SOFT TISSUES: No abnormal calcifications. Visualized lung is unremarkable.   IMPRESSION: 1. Degenerative changes without acute abnormality.   Electronically signed by: Oneil Devonshire MD 09/28/2024 07:13 PM EST RP Workstation: [email or web address] EXAM: 3 VIEW(S) XRAY OF THE RIGHT KNEE 09/24/2024 12:01:25 PM   COMPARISON: None available.   CLINICAL HISTORY: right knee pain   FINDINGS:   BONES AND JOINTS: No acute fracture. No significant joint effusion. Enthesopathic changes at the patella. Mild medial compartment joint space narrowing.   SOFT TISSUES: The soft tissues are unremarkable.   IMPRESSION: 1. Degenerative change without acute abnormality.   Electronically signed by: Oneil Devonshire MD 09/28/2024 07:12 PM EST RP Workstation: [id number]    MRI BRAIN WITHOUT CONTRAST 03/26/2024 09:24:46 PM   TECHNIQUE: Multiplanar multisequence MRI of the head/brain was  performed without the administration of intravenous contrast.   COMPARISON: CT head without contrast 07/31/2019.   CLINICAL HISTORY: Vertigo.   FINDINGS:   BRAIN AND VENTRICLES: No acute infarct. No intracranial hemorrhage. No mass. No midline shift. No hydrocephalus. The sella is unremarkable. Normal flow voids. Scattered subcortical T2 hyperintensities bilaterally are moderately advanced for age.   ORBITS: No acute abnormality.   SINUSES AND MASTOIDS: Mastoid effusion is again noted. No obstructing nasopharyngeal lesion is present.   BONES AND SOFT TISSUES: Normal marrow signal. No acute soft tissue abnormality. Mild degenerative changes are present in the upper cervical spine.   IMPRESSION: 1. No acute intracranial abnormality related to vertigo. 2. Moderately advanced scattered subcortical T2 hyperintensities bilaterally for age. This  likely reflects the sequelae of chronic microvascular ischemia. 3. Mastoid effusion, again noted. No obstructing nasopharyngeal lesion.   Electronically signed by: Lonni Necessary MD 03/26/2024 09:34 PM EDT RP Workstation: [id number]  COGNITION: Overall cognitive status: Within functional limits for tasks assessed   SENSATION: WFL  EDEMA:  Mild R thigh  MUSCLE TONE:    DTRs:    POSTURE:  rounded shoulders and forward head  Cervical ROM:    Active A/PROM (deg) eval  Flexion 100%  Extension 50%  Right lateral flexion 50%  Left lateral flexion 65%  Right rotation 80%  Left rotation 80%  (Blank rows = not tested)  UPPER EXTREMITY ROM:  Active ROM Right eval Left eval  Shoulder flexion 160; p! 180  Shoulder extension    Shoulder abduction 150 180  Shoulder adduction    Shoulder extension    Shoulder internal rotation PSIS L1  Shoulder external rotation 50 65  Elbow flexion    Elbow extension    Wrist flexion    Wrist extension    Wrist ulnar deviation    Wrist radial deviation    Wrist pronation     Wrist supination     (Blank rows = not tested)   UPPER EXTREMITY MMT:  MMT Right eval Left eval  Shoulder flexion 4-; pain 5  Shoulder extension    Shoulder abduction 4-; pain 5  Shoulder adduction    Shoulder extension    Shoulder internal rotation 4+ 5  Shoulder external rotation 4- 4+  Middle trapezius    Lower trapezius    Elbow flexion    Elbow extension    Wrist flexion    Wrist extension    Wrist ulnar deviation    Wrist radial deviation    Wrist pronation    Wrist supination    Grip strength     (Blank rows = not tested)    STRENGTH: nt  LOWER EXTREMITY MMT:   MMT Right eval Left eval  Hip flexion    Hip abduction    Hip adduction    Hip internal rotation    Hip external rotation    Knee flexion    Knee extension    Ankle dorsiflexion    Ankle plantarflexion    Ankle inversion    Ankle eversion    (Blank rows = not tested)  BED MOBILITY:  Sit to supine Min A Supine to sit Min A  TRANSFERS: Assistive device utilized: Environmental Consultant - 2 wheeled  Sit to stand: SBA Stand to sit: SBA Chair to chair: SBA Floor: NT  GAIT: Gait pattern: decreased step length- Left and decreased stance time- Right Distance walked: into clinic x 150' Assistive device utilized: Environmental Consultant - 2 wheeled Level of assistance: SBA Comments:   FUNCTIONAL TESTS:  TBD  PATIENT SURVEYS:  DHI: THE DIZZINESS HANDICAP INVENTORY (DHI)  P1. Does looking up increase your problem? 2 = Sometimes  E2. Because of your problem, do you feel frustrated? 2 = Sometimes  F3. Because of your problem, do you restrict your travel for business or recreation?  0 = No  P4. Does walking down the aisle of a supermarket increase your problems?  0 = No  F5. Because of your problem, do you have difficulty getting into or out of bed?  4 = Yes  F6. Does your problem significantly restrict your participation in social activities, such as going out to dinner, going to the movies, dancing, or going to parties?  0 = No  F7.  Because of your problem, do you have difficulty reading?  0 = No  P8. Does performing more ambitious activities such as sports, dancing, household chores (sweeping or putting dishes away) increase your problems?  2 = Sometimes  E9. Because of your problem, are you afraid to leave your home without having without having someone accompany you?  0 = No  E10. Because of your problem have you been embarrassed in front of others?  0 = No  P11. Do quick movements of your head increase your problem?  0 = No  F12. Because of your problem, do you avoid heights?  0 = No  P13. Does turning over in bed increase your problem?  4 = Yes  F14. Because of your problem, is it difficult for you to do strenuous homework or yard work? 2 = Sometimes  E15. Because of your problem, are you afraid people may think you are intoxicated? 0 = No  F16. Because of your problem, is it difficult for you to go for a walk by yourself?  0 = No  P17. Does walking down a sidewalk increase your problem?  0 = No  E18.Because of your problem, is it difficult for you to concentrate 0 = No  F19. Because of your problem, is it difficult for you to walk around your house in the dark? 2 = Sometimes  E20. Because of your problem, are you afraid to stay home alone?  0 = No  E21. Because of your problem, do you feel handicapped? 0 = No  E22. Has the problem placed stress on your relationships with members of your family or friends? 0 = No  E23. Because of your problem, are you depressed?  0 = No  F24. Does your problem interfere with your job or household responsibilities?  2 = Sometimes  P25. Does bending over increase your problem?  2 = Sometimes  TOTAL 22    DHI Scoring Instructions  The patient is asked to answer each question as it pertains to dizziness or unsteadiness problems, specifically  considering their condition during the last month. Questions are designed to incorporate functional (F), physical  (P), and  emotional (E) impacts on disability.   Scores greater than 10 points should be referred to balance specialists for further evaluation.   16-34 Points (mild handicap)  36-52 Points (moderate handicap)  54+ Points (severe handicap)  Minimally Detectable Change: 17 points ([address] [place], 1990)  [place], G. SHAUNNA. and [place], C. W. (1990). The development of the Dizziness Handicap Inventory. Archives of Otolaryngology - Head and Neck Surgery 116(4): [id number].   VESTIBULAR ASSESSMENT:  GENERAL OBSERVATION:    SYMPTOM BEHAVIOR:  Subjective history: see above note  Non-Vestibular symptoms: none  Type of dizziness: Spinning/Vertigo and Funny feeling in the head  Frequency: whenever getting up/down from bed  Duration: several minutes to an hour  Aggravating factors: Induced by position change: lying supine, rolling to the right, rolling to the left, and supine to sit  Relieving factors: head stationary  Progression of symptoms: worse  OCULOMOTOR EXAM:  Ocular Alignment: normal  Ocular ROM: No Limitations  Spontaneous Nystagmus: absent  Gaze-Induced Nystagmus: left beating with left gaze  Smooth Pursuits: saccades  Saccades: extra eye movements  Convergence/Divergence:  cm   Cover-cross-cover test: NT  VESTIBULAR - OCULAR REFLEX:   Slow VOR: Comment: some nystagmus slow r, fast L  VOR Cancellation: Unable to Maintain Gaze  Head-Impulse Test:  NT  Dynamic Visual Acuity: NT  POSITIONAL TESTING: Right Dix-Hallpike: no obvious nystagmus, but dizziness Left Dix-Hallpike: no noted nystagmus, a little dizzy  MOTION SENSITIVITY:  Motion Sensitivity Quotient Intensity: 0 = none, 1 = Lightheaded, 2 = Mild, 3 = Moderate, 4 = Severe, 5 = Vomiting  Intensity  1. Sitting to supine   2. Supine to L side   3. Supine to R side   4. Supine to sitting   5. L Hallpike-Dix   6. Up from L    7. R Hallpike-Dix   8. Up from R    9. Sitting, head tipped to L knee   10. Head up from L knee    11. Sitting, head tipped to R knee   12. Head up from R knee   13. Sitting head turns x5   14.Sitting head nods x5   15. In stance, 180 turn to L    16. In stance, 180 turn to R     OTHOSTATICS: not done  FUNCTIONAL GAIT: TBD                                                                                                                             TREATMENT DATE:  10/01/24 THERAPEUTIC EXERCISE: To improve strength, endurance, and ROM.  Demonstration, verbal and tactile cues throughout for technique. NuStep L3 x 6'  R shoulder evaluation: subjective/imaging results/ROM/strength measures above  special tests:  + Neer, GLENWOOD Vonzell Berber, + lift off,  TTP over the R UT, infraspinatus, greater tuberosity, coracoid, bicipital groove  THERAPEUTIC ACTIVITIES: To improve functional performance.  Demonstration, verbal and tactile cues throughout for technique. Seated rowing blue TB x 20 BUE Seated shoulder ext RTB x 20 BUE Seated shoulder ER YTB x 20 BUE Seated shoulder HABD x 20 BUE  Corner balance:   Tandem stance BLE x 1' each  Standing Gaze stabilization x 10 head turns, x10 head nods  09/25/24:  Abbreviated session due to 10 min late.  Evaluated R knee pain Gait with R walker, antalgic on R , initial contact on R forefoot, avoids heel strike, maintains R knee stiff at 20 degree bend.  able to walk through clinic up to 80' Lower Extremity Functional Score: 8 / 80 = 10.0 % MMT R LE : pain R post knee and 3+/5 R quads, hamstrings wfl ROM : R knee flexion 100, ext wnl Palpation: edematous R thigh extending from hip to R foot, more prominent edema R thigh noted Treatment: today focused on decreasing edema R LE, pt 4-5 weeks post op R THA, ant approach so edema expected Instructed in elevating R LE with ankle over heart, for 15 to 20 min intervals Inst in R ankle pumps vs black t band 20 x while in elevated position Inst in supine R heel slides with sheet or theraband for  assistance to improve movement R knee Inst in seated R knee long arc quads with black t band assistance 20x  Provided instruction with  medbridge, see below  09/17/24 Modified CRT for R ear x 2.  Patient is seated on mat table with R foot propped on stool for the maneuver.  She is able to lie back over 2 pillows for initial phase.   Then able to roll over to the L side with assist for her RLE due to the recent THA.        Canalith Repositioning:  Epley Right: Number of Reps: 2 Gaze Adaptation:   Habituation:    PATIENT EDUCATION: Education details: avoiding looking down next 24 hours, sleeping in recliner Person educated: Patient Education method: Explanation Education comprehension: verbalized understanding  HOME EXERCISE PROGRAM: Access Code: [email or web address] URL: [email or web address] Date: 10/01/2024 Prepared by: Garnette Montclair  Exercises - Shoulder External Rotation and Scapular Retraction with Resistance  - 1 x daily - 7 x weekly - 2 sets - 10 reps - Standing Shoulder Row with Anchored Resistance  - 1 x daily - 7 x weekly - 2 sets - 10 reps - Shoulder extension with resistance - Neutral  - 1 x daily - 7 x weekly - 2 sets - 10 reps - Standing Shoulder Horizontal Abduction with Resistance  - 1 x daily - 7 x weekly - 2 sets - 10 reps - Standing Gaze Stabilization with Head Rotation  - 1 x daily - 7 x weekly - 3 sets - 10 reps - Standing Gaze Stabilization with Head Nod  - 1 x daily - 7 x weekly - 3 sets - 10 reps  Goals reviewed with patient? Yes  SHORT TERM GOALS: Target date: 10/15/24  Patient will report 25% subjective improvement in dizziness Baseline: Goal status: INITIAL  2.  Patient will be able to transfer sit to and from supine independently Baseline: min assist Goal status: INITIAL  3.  Patient will be independent with advanced HEP  Baseline:  no HEP provided yet  Goal status:  INITIAL  LONG TERM GOALS: Target date: 11/12/24  Patient will report  75-100% subjective improvement in dizziness Baseline:  Goal status: INITIAL  2.  Patient will be able to get up and down from bed and roll to either side with min to no symptoms of dizziness Baseline:  Goal status: INITIAL  3.  Patient will ambulate with least assistive device  independently with no pain and no LOB Baseline: walker dependent with SBA in community Goal status: INITIAL  4.  Patient will score >/= 19 on DGI to decrease fall risk Baseline: TBD Goal status: INITIAL  5.  DHI outcome score will improve to </= 5 to demonstrate improved QOL  Baseline:  22  Goal status:  INITIAL  6.  New goal for R knee for 09/25/24: normal R quads strength and pain free  7. New goal 10/01/24:  Patient will report 50-75% subjective improvement in R shoulder pain   Baseline:  5/10  Goal Status:  INITIAL  8.  New goal 10/01/24  Patient will demo equal ROM B shoulders to be able to reach behind her back for hygiene and self care/dressing  Baseline:  see ROM tables above  Goal status:  INITIAL  9. New Goal 10/01/24:  Patient will improve R shoulder strength to 4/5 to have sufficient strength to do light housework Baseline:  see MMT tables above Goal status:  INITIAL ASSESSMENT:  CLINICAL IMPRESSION:   Unable to do any BPPV testing or treatment today due to R shoulder evaluation and HEP provision.  R shoulder xrays show DJD and she has some  mild GH crepitus present.   There is weakness throughout the R shoulder as well.  Pain is intermittent and prevents her from doing self care ADLs and housework.   Her gait is improving and she is using a cane today, but holding it in the RUE which will likely exacerbate the shoulder pain.  Dizziness is still present and we initiated gaze stabilization for this.   PT remains necessary for dizziness, unsteadiness, and now R shoulder pain/weakness and R knee pain/weakness.   We will continue to follow and address these multiple different issues, but it will be  difficult to improve any single one since we have limited time in PT.    Initial evaluation: Patient is a 63 y.o.  who was seen today for physical therapy evaluation and treatment for vertigo.   She reports having difficulty with getting up and down out of bed due to the room spinning.   States room is still spinning after she sits EOB for a while and that she falls to the L against the wall when she finally does get up to go the bathroom.   She has not fallen to the ground yet, but is afraid that she might.   She just had a R THA about a month ago so she does not need to fall.   She reports the problem started almost a year ago when she had water put into her ears by an audiologist.  She has had the vertigo off and on since that time, but that has gotten much worse in the last month.   She is using a walker for balance and is some unsteady with turning and direction changes.  Gait is slow and guarded, but she states she does not get dizzy walking.   She does have some difficulty with VOR cancellation and with smooth pursuits and nystagmus is noted with both of these tests.   She has a positive hallpike Dix especially to the R.   CRT is done for R ear today.   She may also have a hypofunction problem as well.   PT is necessary for dizziness, HEP and balance deficits.   She is agreeable to the POC  OBJECTIVE IMPAIRMENTS: difficulty walking, dizziness, and pain.   ACTIVITY LIMITATIONS: bending, sleeping, transfers, and locomotion level  PARTICIPATION LIMITATIONS: cleaning, laundry, and community activity  PERSONAL FACTORS: Age, Fitness, and 1-2 comorbidities: OA, DM, moderate asthma, deafness, recent anterior THA by Dr Jerri, are also affecting patient's functional outcome.   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     Lawonda Pretlow, PT,  10/01/2024, 8:42 PM

## 2024-10-06 ENCOUNTER — Telehealth: Payer: Self-pay | Admitting: Family Medicine

## 2024-10-06 NOTE — Telephone Encounter (Signed)
 She is scheduled!

## 2024-10-06 NOTE — Telephone Encounter (Unsigned)
 Copied from CRM #[id number]. Topic: Clinical - Refused Triage >> Oct 06, 2024  9:36 AM Vena HERO wrote: Patient/caller voiced complaints of sore throat, cough and ear pain since last Monday Pt scheduled appt instead. Declined transfer to triage.

## 2024-10-07 ENCOUNTER — Ambulatory Visit: Payer: Self-pay | Admitting: Family

## 2024-10-07 ENCOUNTER — Other Ambulatory Visit: Payer: Self-pay

## 2024-10-07 ENCOUNTER — Ambulatory Visit (HOSPITAL_BASED_OUTPATIENT_CLINIC_OR_DEPARTMENT_OTHER)
Admission: RE | Admit: 2024-10-07 | Discharge: 2024-10-07 | Disposition: A | Discharge status: Home / Self Care | Source: Ambulatory Visit | Attending: Family | Admitting: Family

## 2024-10-07 ENCOUNTER — Ambulatory Visit: Admitting: Family

## 2024-10-07 ENCOUNTER — Other Ambulatory Visit (HOSPITAL_BASED_OUTPATIENT_CLINIC_OR_DEPARTMENT_OTHER): Payer: Self-pay

## 2024-10-07 ENCOUNTER — Ambulatory Visit: Attending: Medical

## 2024-10-07 VITALS — BP 155/38 | HR 86 | Temp 98.6°F | Resp 16 | Ht 64.0 in | Wt 205.0 lb

## 2024-10-07 DIAGNOSIS — M6281 Muscle weakness (generalized): Secondary | ICD-10-CM | POA: Diagnosis present

## 2024-10-07 DIAGNOSIS — I517 Cardiomegaly: Secondary | ICD-10-CM | POA: Diagnosis not present

## 2024-10-07 DIAGNOSIS — J40 Bronchitis, not specified as acute or chronic: Secondary | ICD-10-CM | POA: Insufficient documentation

## 2024-10-07 DIAGNOSIS — R293 Abnormal posture: Secondary | ICD-10-CM | POA: Insufficient documentation

## 2024-10-07 DIAGNOSIS — R269 Unspecified abnormalities of gait and mobility: Secondary | ICD-10-CM | POA: Insufficient documentation

## 2024-10-07 DIAGNOSIS — M25561 Pain in right knee: Secondary | ICD-10-CM | POA: Insufficient documentation

## 2024-10-07 DIAGNOSIS — R059 Cough, unspecified: Secondary | ICD-10-CM | POA: Diagnosis not present

## 2024-10-07 DIAGNOSIS — R2689 Other abnormalities of gait and mobility: Secondary | ICD-10-CM | POA: Diagnosis present

## 2024-10-07 DIAGNOSIS — R42 Dizziness and giddiness: Secondary | ICD-10-CM | POA: Diagnosis present

## 2024-10-07 DIAGNOSIS — M25511 Pain in right shoulder: Secondary | ICD-10-CM | POA: Insufficient documentation

## 2024-10-07 DIAGNOSIS — H8112 Benign paroxysmal vertigo, left ear: Secondary | ICD-10-CM | POA: Diagnosis present

## 2024-10-07 DIAGNOSIS — J42 Unspecified chronic bronchitis: Secondary | ICD-10-CM | POA: Diagnosis not present

## 2024-10-07 DIAGNOSIS — J45909 Unspecified asthma, uncomplicated: Secondary | ICD-10-CM | POA: Diagnosis not present

## 2024-10-07 MED ORDER — AZITHROMYCIN 250 MG PO TABS
ORAL_TABLET | ORAL | 0 refills | Status: AC
  Filled 2024-10-07: qty 6, 5d supply, fill #0

## 2024-10-07 MED ORDER — PROMETHAZINE-DM 6.25-15 MG/5ML PO SYRP
5.0000 mL | ORAL_SOLUTION | Freq: Four times a day (QID) | ORAL | 0 refills | Status: DC | PRN
  Filled 2024-10-07: qty 180, 9d supply, fill #0

## 2024-10-07 NOTE — Patient Instructions (Signed)
  VISIT SUMMARY: Today, you were seen for a persistent cough and yellow mucus production that has lasted for ten days. You have also experienced a sore throat and weight loss, and over-the-counter medications have not helped. We discussed your symptoms and the next steps for treatment.  YOUR PLAN: -BRONCHITIS: Bronchitis is an inflammation of the bronchial tubes, which carry air to your lungs. This can cause a persistent cough and mucus production. We have ordered a chest x-ray to rule out pneumonia and prescribed an antibiotic to treat the bronchitis. Additionally, you have been given a liquid cough medication to help manage your symptoms. Please report if your symptoms do not improve in 3-4 days or if they worsen.  INSTRUCTIONS: Please get a chest x-ray as soon as possible. We will send the results via MyChart. If pneumonia is confirmed, we will prescribe a second antibiotic. Follow up with us  if your symptoms do not improve in 3-4 days or if they worsen.

## 2024-10-07 NOTE — Therapy (Signed)
 OUTPATIENT PHYSICAL THERAPY VESTIBULAR/SHOULDER/LE TREATMENT     Patient Name: Rhonda Morrow MRN: [id number] DOB:[birth year], 63 y.o., female Today's Date: 10/07/2024  END OF SESSION:  PT End of Session - 10/07/24 1453     Visit Number 4    Date for Recertification  11/12/24    PT Start Time 1450    PT Stop Time 1530    PT Time Calculation (min) 40 min    Activity Tolerance Patient tolerated treatment well;No increased pain    Behavior During Therapy WFL for tasks assessed/performed            Past Medical History:  Diagnosis Date   Arthritis    Asthma    Complication of anesthesia    one time woke up and was vey scared,17 yrs ago   Deaf    Depression    Diabetes mellitus without complication (HCC)    GERD (gastroesophageal reflux disease)    Hypertension    Left groin pain    Nausea with vomiting 02/19/2014   Thyroid  disease    Past Surgical History:  Procedure Laterality Date   CARDIAC CATHETERIZATION     CESAREAN SECTION     CHOLECYSTECTOMY N/A 06/20/2016   Procedure: LAPAROSCOPIC CHOLECYSTECTOMY;  Surgeon: Donnice Bury, MD;  Location: [facility] OR;  Service: General;  Laterality: N/A;   ESOPHAGEAL MANOMETRY N/A 09/29/2013   Procedure: ESOPHAGEAL MANOMETRY (EM);  Surgeon: Toribio SHAUNNA Cedar, MD;  Location: [facility] ENDOSCOPY;  Service: Endoscopy;  Laterality: N/A;   TOTAL HIP ARTHROPLASTY Left 02/21/2017   Procedure: LEFT TOTAL HIP ARTHROPLASTY ANTERIOR APPROACH;  Surgeon: Kay CHRISTELLA Cummins, MD;  Location: [facility] OR;  Service: Orthopedics;  Laterality: Left;   TOTAL HIP ARTHROPLASTY Right 08/14/2024   Procedure: ARTHROPLASTY, HIP, TOTAL, ANTERIOR APPROACH;  Surgeon: Cummins Kay CHRISTELLA, MD;  Location: [facility] ORS;  Service: Orthopedics;  Laterality: Right;   Patient Active Problem List   Diagnosis Date Noted   Status post total replacement of right hip 08/14/2024   Gout 06/02/2024   Hearing loss 06/02/2024   Mastoid disorder, unspecified laterality 04/01/2024   Irritable bowel syndrome  with both constipation and diarrhea 07/20/2023   Urinary incontinence 01/31/2023   Vaginal itching 01/31/2023   Influenza 11/20/2022   Postmenopausal bleeding 08/18/2022   Acute non-recurrent pansinusitis 06/18/2022   Leg cramps 03/08/2022   Muscle spasm of right lower extremity 03/08/2022   Unilateral primary osteoarthritis, right hip 03/01/2022   ACE inhibitor intolerance 01/19/2022   Weakness 12/22/2021   Left lower quadrant abdominal pain 12/22/2021   Slow transit constipation 12/22/2021   Class 3 severe obesity due to excess calories without serious comorbidity with body mass index (BMI) of 40.0 to 44.9 in adult (HCC) 07/05/2021   Restless leg syndrome 07/05/2021   Chronic seasonal allergic rhinitis due to pollen 07/05/2021   Psychophysiological insomnia 07/05/2021   Non-compliance 07/05/2021   COVID-19 07/05/2021   Right wrist pain 06/05/2021   Acute pain of left shoulder 01/24/2021   Acute pain of right shoulder 01/24/2021   Foot pain, bilateral 01/24/2021   Dyspepsia 01/24/2021   Preop cardiovascular exam 01/24/2021   Right hand pain 12/29/2020   OSA (obstructive sleep apnea) 09/12/2020   Nocturia more than twice per night 08/03/2020   Non-restorative sleep 08/03/2020   Leg cramping 08/03/2020   Hyperlipidemia associated with type 2 diabetes mellitus (HCC) 07/25/2020   Large breasts 07/22/2020   RLS (restless legs syndrome) 05/17/2020   Cough 04/01/2020   Asthma, moderate persistent, poorly-controlled 04/01/2020   Medication  management 04/01/2020   Menopausal symptoms 03/29/2020   Hot flashes 03/29/2020   Chronic right shoulder pain 08/11/2019   Acute non-recurrent maxillary sinusitis 08/11/2019   BMI 40.0-44.9, adult (HCC) 01/22/2019   Right lower quadrant abdominal pain 10/20/2018   Rectal pain 10/20/2018   Vitamin B 12 deficiency 03/28/2018   B12 deficiency 03/28/2018   DM (diabetes mellitus) type II uncontrolled, periph vascular disorder 03/28/2018    Hyperlipidemia 03/28/2018   Primary osteoarthritis of left hip 02/21/2017   Status post total hip replacement, left 02/21/2017   History of laparoscopic cholecystectomy 06/20/2016   Cerumen impaction 06/09/2016   Hypersomnia 05/16/2016   Knee pain, right 05/05/2016   RUQ pain 04/09/2016   Abnormal CT scan 03/28/2016   Dyspnea and respiratory abnormalities 03/15/2016   Asthma with acute exacerbation 03/15/2016   Asthma in adult 02/25/2016   DM (diabetes mellitus) type II controlled, neurological manifestation (HCC) 02/09/2016   Right hip pain 12/23/2015   Right hamstring muscle strain 11/18/2015   Abdominal pain, acute 09/01/2015   Acute asthma exacerbation 04/16/2015   Acute bronchitis 04/14/2015   Pap smear for cervical cancer screening 09/14/2014   Bed bug bite 05/06/2014   Allergic rhinitis 04/09/2014   Rectal itching 04/09/2014   Bladder spasm 04/09/2014   Knee pain, left 03/05/2014   Hypokalemia 02/19/2014   Nausea with vomiting 02/19/2014   Diabetes mellitus, type II (HCC) 02/19/2014   Edema 02/16/2014   Disorder of rotator cuff 11/12/2013   Routine general medical examination at a health care facility 08/20/2013   Gastroesophageal reflux disease 06/26/2013   Dysphagia, pharyngoesophageal phase 06/26/2013   Suprapubic abdominal pain 05/12/2013   Medication side effect 03/25/2013   Diarrhea 03/25/2013   Benign positional vertigo 01/30/2013   Traumatic hematoma of thigh 01/15/2013   HTN (hypertension) 09/02/2012   Dry skin 08/22/2012   External hemorrhoids 08/22/2012   Obesity 06/30/2012   Thyromegaly 05/22/2012   Back pain 05/22/2012   Elevated glucose 05/22/2012   Moderate persistent asthma 04/19/2012   Dyspnea 01/28/2012    PCP: Dr Antonio  REFERRING PROVIDER:Edward Saguier, Yolande Lloyd, MD  REFERRING DIAG: R42 (ICD-10-CM) - Vertigo/R knee and R shoulder pain  THERAPY DIAG:   Muscle weakness (generalized)  Other abnormalities of gait and  mobility  Abnormality of gait and mobility  Dizziness and giddiness  BPPV (benign paroxysmal positional vertigo), left  Acute pain of right shoulder  ONSET DATE:   Rationale for Evaluation and Treatment: Rehabilitation  SUBJECTIVE:   SUBJECTIVE STATEMENT: 10/07/24:  Patient states feels well, wants another BPPV / vertigo assessment.  Has had a cough/ upper respiratory condition over the last wek so hasn't performed her exercises  for her R knee or R shoulder.    63 y/o patient referred to PT from PCP for vertigo.  Of note, the patient is deaf and communicates via sign language and an interpreter is present for this visit.     She reports she went to her audiologist almost a year ago and when they shot water in her ears she got very dizzy.   The symptoms went away, but have returned off and on since that time until recent week or so when they have become severe. Symptoms are worst when getting up from bed to go to the bathroom at night.   She falls to the L.  She denies ever having this problem before going to the audiologist. She lives with her spouse who is also deaf.   Unknown how his health  is.   Patient comes to clinic using a walker.    Of note, she had an anterior R THA a month ago by Dr Jerri, and has completed her [facility] PT for this issue.  She would like PT for this as well but we do not have any orders from Dr Jerri.   Will request orders per patient request  Pt accompanied by: self  PERTINENT HISTORY: OA, DM, moderate asthma, deafness, recent anterior THA by Dr Jerri,   PAIN:  Are you having pain? Yes: NPRS scale: 4/10 now Pain location: R hip anterior Pain description: achy Aggravating factors: lifting her leg into bed or lying down Relieving factors: nonoffending positions  PRECAUTIONS: Anterior hip  RED FLAGS: None   WEIGHT BEARING RESTRICTIONS: No  FALLS: Has patient fallen in last 6 months? Yes. Number of falls 1  LIVING ENVIRONMENT: Lives with: lives with their family,  lives with their spouse, and lives with their son Lives in: House/apartment Stairs: Yes: External: several steps; unknown Has following equipment at home: Walker - 2 wheeled  PLOF: Independent with gait  PATIENT GOALS: walk better, get rid of the dizziness  OBJECTIVE:  Note: Objective measures were completed at Evaluation unless otherwise noted.  DIAGNOSTIC FINDINGS: EXAM: EXAM: 1 VIEW(S) XRAY OF THE RIGHT SHOULDER 09/24/2024 12:01:25 PM   COMPARISON: None available.   CLINICAL HISTORY: right shoulder pain   FINDINGS:   BONES AND JOINTS: No acute fracture or dislocation. Degenerative changes of the acromioclavicular and glenohumeral joints   SOFT TISSUES: No abnormal calcifications. Visualized lung is unremarkable.   IMPRESSION: 1. Degenerative changes without acute abnormality.   Electronically signed by: Oneil Devonshire MD 09/28/2024 07:13 PM EST RP Workstation: [email or web address] EXAM: 3 VIEW(S) XRAY OF THE RIGHT KNEE 09/24/2024 12:01:25 PM   COMPARISON: None available.   CLINICAL HISTORY: right knee pain   FINDINGS:   BONES AND JOINTS: No acute fracture. No significant joint effusion. Enthesopathic changes at the patella. Mild medial compartment joint space narrowing.   SOFT TISSUES: The soft tissues are unremarkable.   IMPRESSION: 1. Degenerative change without acute abnormality.   Electronically signed by: Oneil Devonshire MD 09/28/2024 07:12 PM EST RP Workstation: [id number]    MRI BRAIN WITHOUT CONTRAST 03/26/2024 09:24:46 PM   TECHNIQUE: Multiplanar multisequence MRI of the head/brain was performed without the administration of intravenous contrast.   COMPARISON: CT head without contrast 07/31/2019.   CLINICAL HISTORY: Vertigo.   FINDINGS:   BRAIN AND VENTRICLES: No acute infarct. No intracranial hemorrhage. No mass. No midline shift. No hydrocephalus. The sella is unremarkable. Normal flow voids. Scattered subcortical T2 hyperintensities  bilaterally are moderately advanced for age.   ORBITS: No acute abnormality.   SINUSES AND MASTOIDS: Mastoid effusion is again noted. No obstructing nasopharyngeal lesion is present.   BONES AND SOFT TISSUES: Normal marrow signal. No acute soft tissue abnormality. Mild degenerative changes are present in the upper cervical spine.   IMPRESSION: 1. No acute intracranial abnormality related to vertigo. 2. Moderately advanced scattered subcortical T2 hyperintensities bilaterally for age. This likely reflects the sequelae of chronic microvascular ischemia. 3. Mastoid effusion, again noted. No obstructing nasopharyngeal lesion.   Electronically signed by: Lonni Necessary MD 03/26/2024 09:34 PM EDT RP Workstation: [id number]  COGNITION: Overall cognitive status: Within functional limits for tasks assessed   SENSATION: WFL  EDEMA:  Mild R thigh  MUSCLE TONE:    DTRs:    POSTURE:  rounded shoulders and forward head  Cervical ROM:    Active A/PROM (  deg) eval  Flexion 100%  Extension 50%  Right lateral flexion 50%  Left lateral flexion 65%  Right rotation 80%  Left rotation 80%  (Blank rows = not tested)  UPPER EXTREMITY ROM:  Active ROM Right eval Left eval  Shoulder flexion 160; p! 180  Shoulder extension    Shoulder abduction 150 180  Shoulder adduction    Shoulder extension    Shoulder internal rotation PSIS L1  Shoulder external rotation 50 65  Elbow flexion    Elbow extension    Wrist flexion    Wrist extension    Wrist ulnar deviation    Wrist radial deviation    Wrist pronation    Wrist supination     (Blank rows = not tested)   UPPER EXTREMITY MMT:  MMT Right eval Left eval  Shoulder flexion 4-; pain 5  Shoulder extension    Shoulder abduction 4-; pain 5  Shoulder adduction    Shoulder extension    Shoulder internal rotation 4+ 5  Shoulder external rotation 4- 4+  Middle trapezius    Lower trapezius    Elbow flexion    Elbow  extension    Wrist flexion    Wrist extension    Wrist ulnar deviation    Wrist radial deviation    Wrist pronation    Wrist supination    Grip strength     (Blank rows = not tested)    STRENGTH: nt  LOWER EXTREMITY MMT:   MMT Right eval Left eval  Hip flexion    Hip abduction    Hip adduction    Hip internal rotation    Hip external rotation    Knee flexion    Knee extension    Ankle dorsiflexion    Ankle plantarflexion    Ankle inversion    Ankle eversion    (Blank rows = not tested)  BED MOBILITY:  Sit to supine Min A Supine to sit Min A  TRANSFERS: Assistive device utilized: Environmental Consultant - 2 wheeled  Sit to stand: SBA Stand to sit: SBA Chair to chair: SBA Floor: NT  GAIT: Gait pattern: decreased step length- Left and decreased stance time- Right Distance walked: into clinic x 150' Assistive device utilized: Environmental Consultant - 2 wheeled Level of assistance: SBA Comments:   FUNCTIONAL TESTS:  TBD  PATIENT SURVEYS:  DHI: THE DIZZINESS HANDICAP INVENTORY (DHI)  P1. Does looking up increase your problem? 2 = Sometimes  E2. Because of your problem, do you feel frustrated? 2 = Sometimes  F3. Because of your problem, do you restrict your travel for business or recreation?  0 = No  P4. Does walking down the aisle of a supermarket increase your problems?  0 = No  F5. Because of your problem, do you have difficulty getting into or out of bed?  4 = Yes  F6. Does your problem significantly restrict your participation in social activities, such as going out to dinner, going to the movies, dancing, or going to parties? 0 = No  F7. Because of your problem, do you have difficulty reading?  0 = No  P8. Does performing more ambitious activities such as sports, dancing, household chores (sweeping or putting dishes away) increase your problems?  2 = Sometimes  E9. Because of your problem, are you afraid to leave your home without having without having someone accompany you?  0 = No   E10. Because of your problem have you been embarrassed in front of others?  0 =  No  P11. Do quick movements of your head increase your problem?  0 = No  F12. Because of your problem, do you avoid heights?  0 = No  P13. Does turning over in bed increase your problem?  4 = Yes  F14. Because of your problem, is it difficult for you to do strenuous homework or yard work? 2 = Sometimes  E15. Because of your problem, are you afraid people may think you are intoxicated? 0 = No  F16. Because of your problem, is it difficult for you to go for a walk by yourself?  0 = No  P17. Does walking down a sidewalk increase your problem?  0 = No  E18.Because of your problem, is it difficult for you to concentrate 0 = No  F19. Because of your problem, is it difficult for you to walk around your house in the dark? 2 = Sometimes  E20. Because of your problem, are you afraid to stay home alone?  0 = No  E21. Because of your problem, do you feel handicapped? 0 = No  E22. Has the problem placed stress on your relationships with members of your family or friends? 0 = No  E23. Because of your problem, are you depressed?  0 = No  F24. Does your problem interfere with your job or household responsibilities?  2 = Sometimes  P25. Does bending over increase your problem?  2 = Sometimes  TOTAL 22    DHI Scoring Instructions  The patient is asked to answer each question as it pertains to dizziness or unsteadiness problems, specifically  considering their condition during the last month. Questions are designed to incorporate functional (F), physical  (P), and emotional (E) impacts on disability.   Scores greater than 10 points should be referred to balance specialists for further evaluation.   16-34 Points (mild handicap)  36-52 Points (moderate handicap)  54+ Points (severe handicap)  Minimally Detectable Change: 17 points ([address] [place], 1990)  [place], G. SHAUNNA. and Terminous, C. W. (1990). The development of the  Dizziness Handicap Inventory. Archives of Otolaryngology - Head and Neck Surgery 116(4): [id number].   VESTIBULAR ASSESSMENT:  GENERAL OBSERVATION:    SYMPTOM BEHAVIOR:  Subjective history: see above note  Non-Vestibular symptoms: none  Type of dizziness: Spinning/Vertigo and Funny feeling in the head  Frequency: whenever getting up/down from bed  Duration: several minutes to an hour  Aggravating factors: Induced by position change: lying supine, rolling to the right, rolling to the left, and supine to sit  Relieving factors: head stationary  Progression of symptoms: worse  OCULOMOTOR EXAM:  Ocular Alignment: normal  Ocular ROM: No Limitations  Spontaneous Nystagmus: absent  Gaze-Induced Nystagmus: left beating with left gaze  Smooth Pursuits: saccades  Saccades: extra eye movements  Convergence/Divergence:  cm   Cover-cross-cover test: NT  VESTIBULAR - OCULAR REFLEX:   Slow VOR: Comment: some nystagmus slow r, fast L  VOR Cancellation: Unable to Maintain Gaze  Head-Impulse Test:  NT  Dynamic Visual Acuity: NT   POSITIONAL TESTING: Right Dix-Hallpike: no obvious nystagmus, but dizziness Left Dix-Hallpike: no noted nystagmus, a little dizzy  MOTION SENSITIVITY:  Motion Sensitivity Quotient Intensity: 0 = none, 1 = Lightheaded, 2 = Mild, 3 = Moderate, 4 = Severe, 5 = Vomiting  Intensity  1. Sitting to supine   2. Supine to L side   3. Supine to R side   4. Supine to sitting   5. L Hallpike-Dix   6.  Up from L    7. R Hallpike-Dix   8. Up from R    9. Sitting, head tipped to L knee   10. Head up from L knee   11. Sitting, head tipped to R knee   12. Head up from R knee   13. Sitting head turns x5   14.Sitting head nods x5   15. In stance, 180 turn to L    16. In stance, 180 turn to R     OTHOSTATICS: not done  FUNCTIONAL GAIT: TBD                                                                                                                              TREATMENT DATE:  10/07/24: Cheked for BPPV: + horizontal Sx L  + R Dix halpike for dizziness Rx of one bout of Epley maneuver for R posterior canalithiasis, with improvement and - sx upn retesting.    Therex: Performed ex as instructed last session:  Seated rowing blue TB x 20 BUE Seated shoulder ext RTB x 20 BUE Seated shoulder ER YTB x 20 BUE Seated shoulder HABD x 20 BUE  Seated long arc quads 3# cuff wt ankle 3 x 10     10/01/24 THERAPEUTIC EXERCISE: To improve strength, endurance, and ROM.  Demonstration, verbal and tactile cues throughout for technique. NuStep L3 x 6'  R shoulder evaluation: subjective/imaging results/ROM/strength measures above  special tests:  + Neer, GLENWOOD Vonzell Berber, + lift off,  TTP over the R UT, infraspinatus, greater tuberosity, coracoid, bicipital groove  THERAPEUTIC ACTIVITIES: To improve functional performance.  Demonstration, verbal and tactile cues throughout for technique. Seated rowing blue TB x 20 BUE Seated shoulder ext RTB x 20 BUE Seated shoulder ER YTB x 20 BUE Seated shoulder HABD x 20 BUE  Corner balance:   Tandem stance BLE x 1' each  Standing Gaze stabilization x 10 head turns, x10 head nods  09/25/24:  Abbreviated session due to 10 min late.  Evaluated R knee pain Gait with R walker, antalgic on R , initial contact on R forefoot, avoids heel strike, maintains R knee stiff at 20 degree bend.  able to walk through clinic up to 80' Lower Extremity Functional Score: 8 / 80 = 10.0 % MMT R LE : pain R post knee and 3+/5 R quads, hamstrings wfl ROM : R knee flexion 100, ext wnl Palpation: edematous R thigh extending from hip to R foot, more prominent edema R thigh noted Treatment: today focused on decreasing edema R LE, pt 4-5 weeks post op R THA, ant approach so edema expected Instructed in elevating R LE with ankle over heart, for 15 to 20 min intervals Inst in R ankle pumps vs black t band 20 x while in elevated position Inst  in supine R heel slides with sheet or theraband for assistance to improve movement R knee Inst in seated R knee long arc quads with  black t band assistance 20x  Provided instruction with medbridge, see below  09/17/24 Modified CRT for R ear x 2.  Patient is seated on mat table with R foot propped on stool for the maneuver.  She is able to lie back over 2 pillows for initial phase.   Then able to roll over to the L side with assist for her RLE due to the recent THA.        Canalith Repositioning:  Epley Right: Number of Reps: 2 Gaze Adaptation:   Habituation:    PATIENT EDUCATION: Education details: avoiding looking down next 24 hours, sleeping in recliner Person educated: Patient Education method: Explanation Education comprehension: verbalized understanding  HOME EXERCISE PROGRAM: Access Code: [email or web address] URL: [email or web address] Date: 10/01/2024 Prepared by: Garnette Montclair  Exercises - Shoulder External Rotation and Scapular Retraction with Resistance  - 1 x daily - 7 x weekly - 2 sets - 10 reps - Standing Shoulder Row with Anchored Resistance  - 1 x daily - 7 x weekly - 2 sets - 10 reps - Shoulder extension with resistance - Neutral  - 1 x daily - 7 x weekly - 2 sets - 10 reps - Standing Shoulder Horizontal Abduction with Resistance  - 1 x daily - 7 x weekly - 2 sets - 10 reps - Standing Gaze Stabilization with Head Rotation  - 1 x daily - 7 x weekly - 3 sets - 10 reps - Standing Gaze Stabilization with Head Nod  - 1 x daily - 7 x weekly - 3 sets - 10 reps  Goals reviewed with patient? Yes  SHORT TERM GOALS: Target date: 10/15/24  Patient will report 25% subjective improvement in dizziness Baseline: Goal status: INITIAL  2.  Patient will be able to transfer sit to and from supine independently Baseline: min assist Goal status: INITIAL  3.  Patient will be independent with advanced HEP  Baseline:  no HEP provided yet  Goal status:  INITIAL  LONG  TERM GOALS: Target date: 11/12/24  Patient will report 75-100% subjective improvement in dizziness Baseline:  Goal status: INITIAL  2.  Patient will be able to get up and down from bed and roll to either side with min to no symptoms of dizziness Baseline:  Goal status: INITIAL  3.  Patient will ambulate with least assistive device  independently with no pain and no LOB Baseline: walker dependent with SBA in community Goal status: INITIAL  4.  Patient will score >/= 19 on DGI to decrease fall risk Baseline: TBD Goal status: INITIAL  5.  DHI outcome score will improve to </= 5 to demonstrate improved QOL  Baseline:  22  Goal status:  INITIAL  6.  New goal for R knee for 09/25/24: normal R quads strength and pain free  7. New goal 10/01/24:  Patient will report 50-75% subjective improvement in R shoulder pain   Baseline:  5/10  Goal Status:  INITIAL  8.  New goal 10/01/24  Patient will demo equal ROM B shoulders to be able to reach behind her back for hygiene and self care/dressing  Baseline:  see ROM tables above  Goal status:  INITIAL  9. New Goal 10/01/24:  Patient will improve R shoulder strength to 4/5 to have sufficient strength to do light housework Baseline:  see MMT tables above Goal status:  INITIAL ASSESSMENT:  CLINICAL IMPRESSION:   Returned today with improved R knee sx/  she coughed hard this week and L ear popped and  now some dizziness.  Reassessed for BPPV per her request.  She responded well to Epley for R posterior canalithiasis.  We reviewed and practiced her new shoulder motor control ex as well as R quads strength.  We will continue to follow and address these multiple different issues, but it will be difficult to improve any single one since we have limited time in PT.    Initial evaluation: Patient is a 63 y.o.  who was seen today for physical therapy evaluation and treatment for vertigo.   She reports having difficulty with getting up and down out of bed due  to the room spinning.   States room is still spinning after she sits EOB for a while and that she falls to the L against the wall when she finally does get up to go the bathroom.   She has not fallen to the ground yet, but is afraid that she might.   She just had a R THA about a month ago so she does not need to fall.   She reports the problem started almost a year ago when she had water put into her ears by an audiologist.  She has had the vertigo off and on since that time, but that has gotten much worse in the last month.   She is using a walker for balance and is some unsteady with turning and direction changes.  Gait is slow and guarded, but she states she does not get dizzy walking.   She does have some difficulty with VOR cancellation and with smooth pursuits and nystagmus is noted with both of these tests.   She has a positive hallpike Dix especially to the R.   CRT is done for R ear today.   She may also have a hypofunction problem as well.   PT is necessary for dizziness, HEP and balance deficits.   She is agreeable to the POC  OBJECTIVE IMPAIRMENTS: difficulty walking, dizziness, and pain.   ACTIVITY LIMITATIONS: bending, sleeping, transfers, and locomotion level  PARTICIPATION LIMITATIONS: cleaning, laundry, and community activity  PERSONAL FACTORS: Age, Fitness, and 1-2 comorbidities: OA, DM, moderate asthma, deafness, recent anterior THA by Dr Jerri, are also affecting patient's functional outcome.   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     Alawna Graybeal L Rjay Revolorio, PT,DPT, OCS  10/07/2024, 4:53 PM

## 2024-10-07 NOTE — Progress Notes (Signed)
 Subjective:     Patient ID: Rhonda Morrow, female    DOB: 1961-09-08, 63 y.o.   MRN: [id number]  Chief Complaint  Patient presents with   Cough    Patient reports having a productive cough for about 10days     HPI  Discussed the use of AI scribe software for clinical note transcription with the patient, who gave verbal consent to proceed.  History of Present Illness Rhonda Morrow is a 64 year old female who presents with a persistent cough and yellow mucus production. She presents with a sign language interpreter for today's visit.   She has been experiencing a persistent cough for approximately ten days, which began on the Saturday before last. The cough is severe, occurs throughout the day, and is accompanied by the production of yellow mucus that is difficult to expel due to its sticky nature. No fever is present, but she has a sore throat, which she attributes to the mucus.  She has experienced a weight loss of five pounds, which she associates with her illness and possibly her medication use. Over-the-counter medications, including Delsym, Robitussin, and Mucinex, have not alleviated her symptoms. She has difficulty sleeping due to the cough, particularly when lying down.  She mentions having had surgery on her hips, which sometimes makes it difficult for her to get up quickly.      Health Maintenance Due  Topic Date Due   Pneumococcal Vaccine: 50+ Years (2 of 2 - PCV) 07/22/2021   FOOT EXAM  07/22/2021   DTaP/Tdap/Td (3 - Td or Tdap) 12/17/2021   Bone Density Scan  03/09/2023   OPHTHALMOLOGY EXAM  07/12/2023    Past Medical History:  Diagnosis Date   Arthritis    Asthma    Complication of anesthesia    one time woke up and was vey scared,17 yrs ago   Deaf    Depression    Diabetes mellitus without complication (HCC)    GERD (gastroesophageal reflux disease)    Hypertension    Left groin pain    Nausea with vomiting 02/19/2014   Thyroid  disease     Past  Surgical History:  Procedure Laterality Date   CARDIAC CATHETERIZATION     CESAREAN SECTION     CHOLECYSTECTOMY N/A 06/20/2016   Procedure: LAPAROSCOPIC CHOLECYSTECTOMY;  Surgeon: Donnice Bury, MD;  Location: [facility] OR;  Service: General;  Laterality: N/A;   ESOPHAGEAL MANOMETRY N/A 09/29/2013   Procedure: ESOPHAGEAL MANOMETRY (EM);  Surgeon: Toribio SHAUNNA Cedar, MD;  Location: [facility];  Service: Endoscopy;  Laterality: N/A;   TOTAL HIP ARTHROPLASTY Left 02/21/2017   Procedure: LEFT TOTAL HIP ARTHROPLASTY ANTERIOR APPROACH;  Surgeon: Kay CHRISTELLA Cummins, MD;  Location: [facility] OR;  Service: Orthopedics;  Laterality: Left;   TOTAL HIP ARTHROPLASTY Right 08/14/2024   Procedure: ARTHROPLASTY, HIP, TOTAL, ANTERIOR APPROACH;  Surgeon: Cummins Kay CHRISTELLA, MD;  Location: [facility] OR[facility];  Service: Orthopedics;  Laterality: Right;    Family History  Problem Relation Age of Onset   Diabetes Mother    Heart disease Mother    Diabetes Father    Colon cancer Neg Hx    Colon polyps Neg Hx    Esophageal cancer Neg Hx    Rectal cancer Neg Hx    Stomach cancer Neg Hx     Social History   Socioeconomic History   Marital status: Married    Spouse name: Not on file   Number of children: Not on file   Years of education:  Not on file   Highest education level: Not on file  Occupational History   Occupation: disabled  Tobacco Use   Smoking status: Never   Smokeless tobacco: Never  Vaping Use   Vaping status: Never Used  Substance and Sexual Activity   Alcohol  use: Yes    Alcohol /week: 1.0 standard drink of alcohol     Types: 1 Glasses of wine per week    Comment: occas   Drug use: No   Sexual activity: Not Currently  Other Topics Concern   Not on file  Social History Narrative   Lives with family.  Has 3 children.  Works for [employer]  Education: high school.   Social Drivers of Corporate Investment Banker Strain: Not on file  Food Insecurity: No Food Insecurity (08/14/2024)   Hunger Vital Sign    Worried  About Running Out of Food in the Last Year: Never true    Ran Out of Food in the Last Year: Never true  Transportation Needs: No Transportation Needs (08/14/2024)   PRAPARE - Administrator, Civil Service (Medical): No    Lack of Transportation (Non-Medical): No  Physical Activity: Not on file  Stress: Not on file  Social Connections: Unknown (03/18/2022)   [facility]   Social Network    Social Network: Not on file  Intimate Partner Violence: Not At Risk (08/14/2024)   Humiliation, Afraid, Rape, and Kick questionnaire    Fear of Current or Ex-Partner: No    Emotionally Abused: No    Physically Abused: No    Sexually Abused: No    Outpatient Medications Prior to Visit  Medication Sig Dispense Refill   albuterol  (VENTOLIN  HFA) 108 (90 Base) MCG/ACT inhaler Inhale 1-2 puffs into the lungs every 6 (six) hours as needed for wheezing or shortness of breath. 18 each 3   allopurinol  (ZYLOPRIM ) 100 MG tablet Take 1 tablet (100 mg total) by mouth daily. 90 tablet 0   amLODipine  (NORVASC ) 5 MG tablet TAKE 1 TABLET (5 MG TOTAL) BY MOUTH DAILY. 90 tablet 0   ASPIRIN  LOW DOSE 81 MG chewable tablet CHEW 1 TABLET BY MOUTH 2 TIMES DAILY. TO BE TAKEN AFTER SURGERY TO PREVENT BLOOD CLOTS 180 tablet 1   atorvastatin  (LIPITOR) 20 MG tablet TAKE 1 TABLET BY MOUTH EVERY DAY 90 tablet 1   Azelastine  HCl 137 MCG/SPRAY SOLN PLACE 2 SPRAYS INTO BOTH NOSTRILS 2 (TWO) TIMES DAILY. USE IN EACH NOSTRIL AS DIRECTED (Patient taking differently: as needed.) 30 mL 3   blood glucose meter kit and supplies KIT Dispense based on patient and insurance preference. Use up to four times daily as directed. (FOR ICD-9 250.00, 250.01). 1 each 0   cetirizine  (ZYRTEC ) 10 MG tablet TAKE 1 TABLET BY MOUTH EVERY DAY 90 tablet 2   docusate sodium  (COLACE) 100 MG capsule Take 1 capsule (100 mg total) by mouth daily as needed. 30 capsule 2   doxycycline  (VIBRAMYCIN ) 100 MG capsule Take 1 capsule (100 mg total) by  mouth 2 (two) times daily. To be taken after surgery 20 capsule 0   fluticasone  (FLONASE ) 50 MCG/ACT nasal spray Place 2 sprays into both nostrils daily. (Patient taking differently: Place 2 sprays into both nostrils daily as needed for allergies.) 16 g 6   Fluticasone -Umeclidin-Vilant (TRELEGY ELLIPTA ) 100-62.5-25 MCG/ACT AEPB Inhale 1 puff into the lungs daily. 1 each 5   HYDROcodone -acetaminophen  (NORCO) 7.5-325 MG tablet Take 1-2 tablets by mouth every 6 (six) hours as  needed for moderate pain (pain score 4-6). 40 tablet 0   HYDROcodone -acetaminophen  (NORCO/VICODIN) 5-325 MG tablet Take 1 tablet by mouth 2 (two) times daily as needed for moderate pain (pain score 4-6). 20 tablet 0   meclizine  (ANTIVERT ) 25 MG tablet Take 1 tablet (25 mg total) by mouth 3 (three) times daily as needed for dizziness. 20 tablet 0   metFORMIN  (GLUCOPHAGE -XR) 500 MG 24 hr tablet Take 2 tablets (1,000 mg total) by mouth daily with breakfast. 180 tablet 1   methocarbamol  (ROBAXIN ) 500 MG tablet Take 1 tablet (500 mg total) by mouth 2 (two) times daily as needed. 20 tablet 0   methocarbamol  (ROBAXIN ) 750 MG tablet Take 1 tablet (750 mg total) by mouth 3 (three) times daily as needed. 30 tablet 2   Na Sulfate-K Sulfate-Mg Sulfate concentrate (SUPREP) 17.5-3.13-1.6 GM/177ML SOLN Take 1 kit by mouth See admin instructions.     nystatin  (MYCOSTATIN /NYSTOP ) powder Apply 1 Application topically 3 (three) times daily. 15 g 1   omeprazole  (PRILOSEC) 20 MG capsule Take 20 mg by mouth in the morning and at bedtime.     ondansetron  (ZOFRAN ) 4 MG tablet Take 1 tablet (4 mg total) by mouth every 8 (eight) hours as needed for nausea or vomiting. 40 tablet 0   potassium chloride  (KLOR-CON ) 10 MEQ tablet Take 1 tablet (10 mEq total) by mouth 2 (two) times daily. 180 tablet 1   Semaglutide  (RYBELSUS ) 3 MG TABS Take 3 mg by mouth daily.     Semaglutide  (RYBELSUS ) 7 MG TABS Take 1 tablet (7 mg total) by mouth daily. 90 tablet 1    solifenacin  (VESICARE ) 5 MG tablet TAKE 1 TABLET (5 MG TOTAL) BY MOUTH DAILY. 90 tablet 1   traMADol  (ULTRAM ) 50 MG tablet Take 1-2 tablets (50-100 mg total) by mouth daily as needed. 20 tablet 0   Facility-Administered Medications Prior to Visit  Medication Dose Route Frequency Provider Last Rate Last Admin   alum & mag hydroxide-simeth (MAALOX/MYLANTA) 200-200-20 MG/5ML suspension 30 mL  30 mL Oral Once Saguier, Edward, PA-C       lidocaine  (XYLOCAINE ) 2 % viscous mouth solution 15 mL  15 mL Mouth/Throat Once Saguier, Edward, PA-C       nitroGLYCERIN  (NITRODUR - Dosed in mg/24 hr) patch 0.2 mg  0.2 mg Transdermal Daily Regal, [place] RAMAN, DPM        Allergies  Allergen Reactions   Losartan  Shortness Of Breath   Oxycodone -Acetaminophen  Nausea And Vomiting   Augmentin  [Amoxicillin -Pot Clavulanate] Diarrhea    ROS    See HPI  Objective:    Physical Exam Constitutional:      General: She is not in acute distress.    Appearance: Normal appearance. She is well-developed.  HENT:     Head: Normocephalic and atraumatic.     Right Ear: Tympanic membrane, ear canal and external ear normal.     Left Ear: Ear canal and external ear normal.     Mouth/Throat:     Pharynx: No posterior oropharyngeal erythema.  Eyes:     General: No scleral icterus. Neck:     Thyroid : No thyromegaly.  Cardiovascular:     Rate and Rhythm: Normal rate and regular rhythm.     Heart sounds: Normal heart sounds. No murmur heard. Pulmonary:     Effort: Pulmonary effort is normal. No respiratory distress.     Breath sounds: Normal breath sounds. No wheezing.  Musculoskeletal:     Cervical back: Neck supple.  Skin:  General: Skin is warm and dry.  Neurological:     Mental Status: She is alert and oriented to person, place, and time.  Psychiatric:        Mood and Affect: Mood normal.        Behavior: Behavior normal.        Thought Content: Thought content normal.        Judgment: Judgment normal.       BP (!) 155/38 (BP Location: Left Arm, Patient Position: Sitting, Cuff Size: Small)   Pulse 86   Temp 98.6 F (37 C) (Oral)   Resp 16   Ht 5' 4 (1.626 m)   Wt 205 lb (93 kg)   LMP 05/20/2012   SpO2 100%   BMI 35.19 kg/m  Wt Readings from Last 3 Encounters:  10/07/24 205 lb (93 kg)  09/24/24 210 lb (95.3 kg)  09/08/24 208 lb 12.8 oz (94.7 kg)       Assessment & Plan:   Problem List Items Addressed This Visit   None Visit Diagnoses       Bronchitis    -  Primary   Relevant Medications   azithromycin  (ZITHROMAX ) 250 MG tablet   promethazine -dextromethorphan (PROMETHAZINE -DM) 6.25-15 MG/5ML syrup   Other Relevant Orders   DG Chest 2 View      Persistent cough with yellow mucus for ten days, no fever. Differential includes bronchitis and pneumonia. Over-the-counter medications ineffective. - Ordered chest x-ray to rule out pneumonia. - Prescribed zpak given duration of symptoms. Add doxycycline  if + PNA on x-ray. - Prescribed promethazine  DM for cough. - Advised to report if symptoms do not improve in 3-4 days or worsen. - Will send chest x-ray results via MyChart. - If pneumonia confirmed, will add second antibiotic.  Assessment & Plan      I am having Gianina A. Smallman start on azithromycin  and promethazine -dextromethorphan. I am also having her maintain her blood glucose meter kit and supplies, Azelastine  HCl, fluticasone , cetirizine , Trelegy Ellipta , allopurinol , metFORMIN , potassium chloride , albuterol , Na Sulfate-K Sulfate-Mg Sulfate concentrate, meclizine , Rybelsus , omeprazole , Rybelsus , doxycycline , HYDROcodone -acetaminophen , ondansetron , docusate sodium , methocarbamol , Aspirin  Low Dose, nystatin , HYDROcodone -acetaminophen , methocarbamol , solifenacin , atorvastatin , amLODipine , and traMADol . We will continue to administer alum & mag hydroxide-simeth, lidocaine , and nitroGLYCERIN .  Meds ordered this encounter  Medications   azithromycin  (ZITHROMAX ) 250 MG  tablet    Sig: Take 2 tablets on day 1, then 1 tablet daily on days 2 through 5    Dispense:  6 tablet    Refill:  0    Supervising Provider:   DOMENICA BLACKBIRD A [[id number]]   promethazine -dextromethorphan (PROMETHAZINE -DM) 6.25-15 MG/5ML syrup    Sig: Take 5 mLs by mouth 4 (four) times daily as needed for cough.    Dispense:  180 mL    Refill:  0    Supervising Provider:   DOMENICA BLACKBIRD A [[id number]]

## 2024-10-09 ENCOUNTER — Ambulatory Visit

## 2024-10-09 ENCOUNTER — Other Ambulatory Visit: Payer: Self-pay

## 2024-10-09 DIAGNOSIS — M6281 Muscle weakness (generalized): Secondary | ICD-10-CM | POA: Diagnosis not present

## 2024-10-09 DIAGNOSIS — R293 Abnormal posture: Secondary | ICD-10-CM

## 2024-10-09 DIAGNOSIS — R2689 Other abnormalities of gait and mobility: Secondary | ICD-10-CM

## 2024-10-09 DIAGNOSIS — M25511 Pain in right shoulder: Secondary | ICD-10-CM

## 2024-10-09 DIAGNOSIS — H8112 Benign paroxysmal vertigo, left ear: Secondary | ICD-10-CM

## 2024-10-09 DIAGNOSIS — R269 Unspecified abnormalities of gait and mobility: Secondary | ICD-10-CM

## 2024-10-09 DIAGNOSIS — R42 Dizziness and giddiness: Secondary | ICD-10-CM

## 2024-10-09 DIAGNOSIS — M25561 Pain in right knee: Secondary | ICD-10-CM

## 2024-10-09 NOTE — Therapy (Signed)
 OUTPATIENT PHYSICAL THERAPY VESTIBULAR/SHOULDER/LE TREATMENT     Patient Name: Rhonda Morrow MRN: [id number] DOB:[birth year], 63 y.o., female Today's Date: 10/09/2024  END OF SESSION:  PT End of Session - 10/09/24 1539     Visit Number 5    Date for Recertification  11/12/24    Authorization Type [employer]    Authorization Time Period 6/48 units used    PT Start Time 1533    PT Stop Time 1615    PT Time Calculation (min) 42 min    Activity Tolerance Patient tolerated treatment well;No increased pain    Behavior During Therapy WFL for tasks assessed/performed             Past Medical History:  Diagnosis Date   Arthritis    Asthma    Complication of anesthesia    one time woke up and was vey scared,17 yrs ago   Deaf    Depression    Diabetes mellitus without complication (HCC)    GERD (gastroesophageal reflux disease)    Hypertension    Left groin pain    Nausea with vomiting 02/19/2014   Thyroid  disease    Past Surgical History:  Procedure Laterality Date   CARDIAC CATHETERIZATION     CESAREAN SECTION     CHOLECYSTECTOMY N/A 06/20/2016   Procedure: LAPAROSCOPIC CHOLECYSTECTOMY;  Surgeon: Donnice Bury, MD;  Location: [facility] OR;  Service: General;  Laterality: N/A;   ESOPHAGEAL MANOMETRY N/A 09/29/2013   Procedure: ESOPHAGEAL MANOMETRY (EM);  Surgeon: Toribio SHAUNNA Cedar, MD;  Location: [facility] ENDOSCOPY;  Service: Endoscopy;  Laterality: N/A;   TOTAL HIP ARTHROPLASTY Left 02/21/2017   Procedure: LEFT TOTAL HIP ARTHROPLASTY ANTERIOR APPROACH;  Surgeon: Kay CHRISTELLA Cummins, MD;  Location: [facility] OR;  Service: Orthopedics;  Laterality: Left;   TOTAL HIP ARTHROPLASTY Right 08/14/2024   Procedure: ARTHROPLASTY, HIP, TOTAL, ANTERIOR APPROACH;  Surgeon: Cummins Kay CHRISTELLA, MD;  Location: [facility] ORS;  Service: Orthopedics;  Laterality: Right;   Patient Active Problem List   Diagnosis Date Noted   Status post total replacement of right hip 08/14/2024   Gout 06/02/2024   Hearing loss 06/02/2024    Mastoid disorder, unspecified laterality 04/01/2024   Irritable bowel syndrome with both constipation and diarrhea 07/20/2023   Urinary incontinence 01/31/2023   Vaginal itching 01/31/2023   Influenza 11/20/2022   Postmenopausal bleeding 08/18/2022   Acute non-recurrent pansinusitis 06/18/2022   Leg cramps 03/08/2022   Muscle spasm of right lower extremity 03/08/2022   Unilateral primary osteoarthritis, right hip 03/01/2022   ACE inhibitor intolerance 01/19/2022   Weakness 12/22/2021   Left lower quadrant abdominal pain 12/22/2021   Slow transit constipation 12/22/2021   Class 3 severe obesity due to excess calories without serious comorbidity with body mass index (BMI) of 40.0 to 44.9 in adult (HCC) 07/05/2021   Restless leg syndrome 07/05/2021   Chronic seasonal allergic rhinitis due to pollen 07/05/2021   Psychophysiological insomnia 07/05/2021   Non-compliance 07/05/2021   COVID-19 07/05/2021   Right wrist pain 06/05/2021   Acute pain of left shoulder 01/24/2021   Acute pain of right shoulder 01/24/2021   Foot pain, bilateral 01/24/2021   Dyspepsia 01/24/2021   Preop cardiovascular exam 01/24/2021   Right hand pain 12/29/2020   OSA (obstructive sleep apnea) 09/12/2020   Nocturia more than twice per night 08/03/2020   Non-restorative sleep 08/03/2020   Leg cramping 08/03/2020   Hyperlipidemia associated with type 2 diabetes mellitus (HCC) 07/25/2020   Large breasts 07/22/2020   RLS (restless legs  syndrome) 05/17/2020   Cough 04/01/2020   Asthma, moderate persistent, poorly-controlled 04/01/2020   Medication management 04/01/2020   Menopausal symptoms 03/29/2020   Hot flashes 03/29/2020   Chronic right shoulder pain 08/11/2019   Acute non-recurrent maxillary sinusitis 08/11/2019   BMI 40.0-44.9, adult (HCC) 01/22/2019   Right lower quadrant abdominal pain 10/20/2018   Rectal pain 10/20/2018   Vitamin B 12 deficiency 03/28/2018   B12 deficiency 03/28/2018   DM (diabetes  mellitus) type II uncontrolled, periph vascular disorder 03/28/2018   Hyperlipidemia 03/28/2018   Primary osteoarthritis of left hip 02/21/2017   Status post total hip replacement, left 02/21/2017   History of laparoscopic cholecystectomy 06/20/2016   Cerumen impaction 06/09/2016   Hypersomnia 05/16/2016   Knee pain, right 05/05/2016   RUQ pain 04/09/2016   Abnormal CT scan 03/28/2016   Dyspnea and respiratory abnormalities 03/15/2016   Asthma with acute exacerbation 03/15/2016   Asthma in adult 02/25/2016   DM (diabetes mellitus) type II controlled, neurological manifestation (HCC) 02/09/2016   Right hip pain 12/23/2015   Right hamstring muscle strain 11/18/2015   Abdominal pain, acute 09/01/2015   Acute asthma exacerbation 04/16/2015   Acute bronchitis 04/14/2015   Pap smear for cervical cancer screening 09/14/2014   Bed bug bite 05/06/2014   Allergic rhinitis 04/09/2014   Rectal itching 04/09/2014   Bladder spasm 04/09/2014   Knee pain, left 03/05/2014   Hypokalemia 02/19/2014   Nausea with vomiting 02/19/2014   Diabetes mellitus, type II (HCC) 02/19/2014   Edema 02/16/2014   Disorder of rotator cuff 11/12/2013   Routine general medical examination at a health care facility 08/20/2013   Gastroesophageal reflux disease 06/26/2013   Dysphagia, pharyngoesophageal phase 06/26/2013   Suprapubic abdominal pain 05/12/2013   Medication side effect 03/25/2013   Diarrhea 03/25/2013   Benign positional vertigo 01/30/2013   Traumatic hematoma of thigh 01/15/2013   HTN (hypertension) 09/02/2012   Dry skin 08/22/2012   External hemorrhoids 08/22/2012   Obesity 06/30/2012   Thyromegaly 05/22/2012   Back pain 05/22/2012   Elevated glucose 05/22/2012   Moderate persistent asthma 04/19/2012   Dyspnea 01/28/2012    PCP: Dr Antonio  REFERRING PROVIDER:Edward Saguier, Yolande Lloyd, MD  REFERRING DIAG: R42 (ICD-10-CM) - Vertigo/R knee and R shoulder pain  THERAPY DIAG:    Muscle weakness (generalized)  Other abnormalities of gait and mobility  Abnormality of gait and mobility  Dizziness and giddiness  BPPV (benign paroxysmal positional vertigo), left  Acute pain of right shoulder  Abnormal posture  Acute pain of right knee  ONSET DATE:   Rationale for Evaluation and Treatment: Rehabilitation  SUBJECTIVE:   SUBJECTIVE STATEMENT: 10/09/24:  Patient states feels well, no problem with vertigo.  R knee stiff, sleeping better, obtained medication from PCP due to her cough the other day.  Wants to work on her R shoulder and R knee   63 y/o patient referred to PT from PCP for vertigo.  Of note, the patient is deaf and communicates via sign language and an interpreter is present for this visit.     She reports she went to her audiologist almost a year ago and when they shot water in her ears she got very dizzy.   The symptoms went away, but have returned off and on since that time until recent week or so when they have become severe. Symptoms are worst when getting up from bed to go to the bathroom at night.   She falls to the L.  She  denies ever having this problem before going to the audiologist. She lives with her spouse who is also deaf.   Unknown how his health is.   Patient comes to clinic using a walker.    Of note, she had an anterior R THA a month ago by Dr Jerri, and has completed her [facility] PT for this issue.  She would like PT for this as well but we do not have any orders from Dr Jerri.   Will request orders per patient request  Pt accompanied by: self  PERTINENT HISTORY: OA, DM, moderate asthma, deafness, recent anterior THA by Dr Jerri,   PAIN:  Are you having pain? Yes: NPRS scale: 4/10 now Pain location: R hip anterior Pain description: achy Aggravating factors: lifting her leg into bed or lying down Relieving factors: nonoffending positions  PRECAUTIONS: Anterior hip  RED FLAGS: None   WEIGHT BEARING RESTRICTIONS: No  FALLS: Has patient  fallen in last 6 months? Yes. Number of falls 1  LIVING ENVIRONMENT: Lives with: lives with their family, lives with their spouse, and lives with their son Lives in: House/apartment Stairs: Yes: External: several steps; unknown Has following equipment at home: Walker - 2 wheeled  PLOF: Independent with gait  PATIENT GOALS: walk better, get rid of the dizziness  OBJECTIVE:  Note: Objective measures were completed at Evaluation unless otherwise noted.  DIAGNOSTIC FINDINGS: EXAM: EXAM: 1 VIEW(S) XRAY OF THE RIGHT SHOULDER 09/24/2024 12:01:25 PM   COMPARISON: None available.   CLINICAL HISTORY: right shoulder pain   FINDINGS:   BONES AND JOINTS: No acute fracture or dislocation. Degenerative changes of the acromioclavicular and glenohumeral joints   SOFT TISSUES: No abnormal calcifications. Visualized lung is unremarkable.   IMPRESSION: 1. Degenerative changes without acute abnormality.   Electronically signed by: Oneil Devonshire MD 09/28/2024 07:13 PM EST RP Workstation: [email or web address] EXAM: 3 VIEW(S) XRAY OF THE RIGHT KNEE 09/24/2024 12:01:25 PM   COMPARISON: None available.   CLINICAL HISTORY: right knee pain   FINDINGS:   BONES AND JOINTS: No acute fracture. No significant joint effusion. Enthesopathic changes at the patella. Mild medial compartment joint space narrowing.   SOFT TISSUES: The soft tissues are unremarkable.   IMPRESSION: 1. Degenerative change without acute abnormality.   Electronically signed by: Oneil Devonshire MD 09/28/2024 07:12 PM EST RP Workstation: [id number]    MRI BRAIN WITHOUT CONTRAST 03/26/2024 09:24:46 PM   TECHNIQUE: Multiplanar multisequence MRI of the head/brain was performed without the administration of intravenous contrast.   COMPARISON: CT head without contrast 07/31/2019.   CLINICAL HISTORY: Vertigo.   FINDINGS:   BRAIN AND VENTRICLES: No acute infarct. No intracranial hemorrhage. No mass. No midline shift.  No hydrocephalus. The sella is unremarkable. Normal flow voids. Scattered subcortical T2 hyperintensities bilaterally are moderately advanced for age.   ORBITS: No acute abnormality.   SINUSES AND MASTOIDS: Mastoid effusion is again noted. No obstructing nasopharyngeal lesion is present.   BONES AND SOFT TISSUES: Normal marrow signal. No acute soft tissue abnormality. Mild degenerative changes are present in the upper cervical spine.   IMPRESSION: 1. No acute intracranial abnormality related to vertigo. 2. Moderately advanced scattered subcortical T2 hyperintensities bilaterally for age. This likely reflects the sequelae of chronic microvascular ischemia. 3. Mastoid effusion, again noted. No obstructing nasopharyngeal lesion.   Electronically signed by: Lonni Necessary MD 03/26/2024 09:34 PM EDT RP Workstation: [id number]  COGNITION: Overall cognitive status: Within functional limits for tasks assessed   SENSATION: WFL  EDEMA:  Mild R thigh  MUSCLE TONE:    DTRs:    POSTURE:  rounded shoulders and forward head  Cervical ROM:    Active A/PROM (deg) eval  Flexion 100%  Extension 50%  Right lateral flexion 50%  Left lateral flexion 65%  Right rotation 80%  Left rotation 80%  (Blank rows = not tested)  UPPER EXTREMITY ROM:  Active ROM Right eval Left eval  Shoulder flexion 160; p! 180  Shoulder extension    Shoulder abduction 150 180  Shoulder adduction    Shoulder extension    Shoulder internal rotation PSIS L1  Shoulder external rotation 50 65  Elbow flexion    Elbow extension    Wrist flexion    Wrist extension    Wrist ulnar deviation    Wrist radial deviation    Wrist pronation    Wrist supination     (Blank rows = not tested)   UPPER EXTREMITY MMT:  MMT Right eval Left eval  Shoulder flexion 4-; pain 5  Shoulder extension    Shoulder abduction 4-; pain 5  Shoulder adduction    Shoulder extension    Shoulder internal  rotation 4+ 5  Shoulder external rotation 4- 4+  Middle trapezius    Lower trapezius    Elbow flexion    Elbow extension    Wrist flexion    Wrist extension    Wrist ulnar deviation    Wrist radial deviation    Wrist pronation    Wrist supination    Grip strength     (Blank rows = not tested)    STRENGTH: nt  LOWER EXTREMITY MMT:   MMT Right eval Left eval  Hip flexion    Hip abduction    Hip adduction    Hip internal rotation    Hip external rotation    Knee flexion    Knee extension    Ankle dorsiflexion    Ankle plantarflexion    Ankle inversion    Ankle eversion    (Blank rows = not tested)  BED MOBILITY:  Sit to supine Min A Supine to sit Min A  TRANSFERS: Assistive device utilized: [employer]ant - 2 wheeled  Sit to stand: SBA Stand to sit: SBA Chair to chair: SBA Floor: NT  GAIT: Gait pattern: decreased step length- Left and decreased stance time- Right Distance walked: into clinic x 150' Assistive device utilized: Environmental Consultant - 2 wheeled Level of assistance: SBA Comments:   FUNCTIONAL TESTS:  TBD  PATIENT SURVEYS:  DHI: THE DIZZINESS HANDICAP INVENTORY (DHI)  P1. Does looking up increase your problem? 2 = Sometimes  E2. Because of your problem, do you feel frustrated? 2 = Sometimes  F3. Because of your problem, do you restrict your travel for business or recreation?  0 = No  P4. Does walking down the aisle of a supermarket increase your problems?  0 = No  F5. Because of your problem, do you have difficulty getting into or out of bed?  4 = Yes  F6. Does your problem significantly restrict your participation in social activities, such as going out to dinner, going to the movies, dancing, or going to parties? 0 = No  F7. Because of your problem, do you have difficulty reading?  0 = No  P8. Does performing more ambitious activities such as sports, dancing, household chores (sweeping or putting dishes away) increase your problems?  2 = Sometimes  E9. Because  of your problem, are you afraid to leave your home without having without  having someone accompany you?  0 = No  E10. Because of your problem have you been embarrassed in front of others?  0 = No  P11. Do quick movements of your head increase your problem?  0 = No  F12. Because of your problem, do you avoid heights?  0 = No  P13. Does turning over in bed increase your problem?  4 = Yes  F14. Because of your problem, is it difficult for you to do strenuous homework or yard work? 2 = Sometimes  E15. Because of your problem, are you afraid people may think you are intoxicated? 0 = No  F16. Because of your problem, is it difficult for you to go for a walk by yourself?  0 = No  P17. Does walking down a sidewalk increase your problem?  0 = No  E18.Because of your problem, is it difficult for you to concentrate 0 = No  F19. Because of your problem, is it difficult for you to walk around your house in the dark? 2 = Sometimes  E20. Because of your problem, are you afraid to stay home alone?  0 = No  E21. Because of your problem, do you feel handicapped? 0 = No  E22. Has the problem placed stress on your relationships with members of your family or friends? 0 = No  E23. Because of your problem, are you depressed?  0 = No  F24. Does your problem interfere with your job or household responsibilities?  2 = Sometimes  P25. Does bending over increase your problem?  2 = Sometimes  TOTAL 22    DHI Scoring Instructions  The patient is asked to answer each question as it pertains to dizziness or unsteadiness problems, specifically  considering their condition during the last month. Questions are designed to incorporate functional (F), physical  (P), and emotional (E) impacts on disability.   Scores greater than 10 points should be referred to balance specialists for further evaluation.   16-34 Points (mild handicap)  36-52 Points (moderate handicap)  54+ Points (severe handicap)  Minimally Detectable  Change: 17 points ([address] [place], 1990)  [place], G. SHAUNNA. and [place], C. W. (1990). The development of the Dizziness Handicap Inventory. Archives of Otolaryngology - Head and Neck Surgery 116(4): [id number].   VESTIBULAR ASSESSMENT:  GENERAL OBSERVATION:    SYMPTOM BEHAVIOR:  Subjective history: see above note  Non-Vestibular symptoms: none  Type of dizziness: Spinning/Vertigo and Funny feeling in the head  Frequency: whenever getting up/down from bed  Duration: several minutes to an hour  Aggravating factors: Induced by position change: lying supine, rolling to the right, rolling to the left, and supine to sit  Relieving factors: head stationary  Progression of symptoms: worse  OCULOMOTOR EXAM:  Ocular Alignment: normal  Ocular ROM: No Limitations  Spontaneous Nystagmus: absent  Gaze-Induced Nystagmus: left beating with left gaze  Smooth Pursuits: saccades  Saccades: extra eye movements  Convergence/Divergence:  cm   Cover-cross-cover test: NT  VESTIBULAR - OCULAR REFLEX:   Slow VOR: Comment: some nystagmus slow r, fast L  VOR Cancellation: Unable to Maintain Gaze  Head-Impulse Test:  NT  Dynamic Visual Acuity: NT   POSITIONAL TESTING: Right Dix-Hallpike: no obvious nystagmus, but dizziness Left Dix-Hallpike: no noted nystagmus, a little dizzy  MOTION SENSITIVITY:  Motion Sensitivity Quotient Intensity: 0 = none, 1 = Lightheaded, 2 = Mild, 3 = Moderate, 4 = Severe, 5 = Vomiting  Intensity  1. Sitting to supine   2.  Supine to L side   3. Supine to R side   4. Supine to sitting   5. L Hallpike-Dix   6. Up from L    7. R Hallpike-Dix   8. Up from R    9. Sitting, head tipped to L knee   10. Head up from L knee   11. Sitting, head tipped to R knee   12. Head up from R knee   13. Sitting head turns x5   14.Sitting head nods x5   15. In stance, 180 turn to L    16. In stance, 180 turn to R     OTHOSTATICS: not done  FUNCTIONAL GAIT: TBD                                                                                                                              TREATMENT DATE:  11/30/23: Nustep L 5 x 6 min B knee flexion 20# 12 x 2 B knee ext 5# x 2 x 12 Seated rows 20# , 2 x 12  Paloff press green t band 15 reps each direction On 4 step for B heel drops, and B heel raises 10 x 2 Forward step ups leading with R LE x 15 reps  Gait with st cane, 90', adjusted height downward, pt using cane in R hand.  10/07/24: Cheked for BPPV: + horizontal Sx L  + R Dix halpike for dizziness Rx of one bout of Epley maneuver for R posterior canalithiasis, with improvement and - sx upn retesting.    Therex: Performed ex as instructed last session:  Seated rowing blue TB x 20 BUE Seated shoulder ext RTB x 20 BUE Seated shoulder ER YTB x 20 BUE Seated shoulder HABD x 20 BUE  Seated long arc quads 3# cuff wt ankle 3 x 10     10/01/24 THERAPEUTIC EXERCISE: To improve strength, endurance, and ROM.  Demonstration, verbal and tactile cues throughout for technique. NuStep L3 x 6'  R shoulder evaluation: subjective/imaging results/ROM/strength measures above  special tests:  + Neer, GLENWOOD Vonzell Berber, + lift off,  TTP over the R UT, infraspinatus, greater tuberosity, coracoid, bicipital groove  THERAPEUTIC ACTIVITIES: To improve functional performance.  Demonstration, verbal and tactile cues throughout for technique. Seated rowing blue TB x 20 BUE Seated shoulder ext RTB x 20 BUE Seated shoulder ER YTB x 20 BUE Seated shoulder HABD x 20 BUE  Corner balance:   Tandem stance BLE x 1' each  Standing Gaze stabilization x 10 head turns, x10 head nods  09/25/24:  Abbreviated session due to 10 min late.  Evaluated R knee pain Gait with R walker, antalgic on R , initial contact on R forefoot, avoids heel strike, maintains R knee stiff at 20 degree bend.  able to walk through clinic up to 80' Lower Extremity Functional Score: 8 / 80 = 10.0 % MMT R LE :  pain R post knee and 3+/5  R quads, hamstrings wfl ROM : R knee flexion 100, ext wnl Palpation: edematous R thigh extending from hip to R foot, more prominent edema R thigh noted Treatment: today focused on decreasing edema R LE, pt 4-5 weeks post op R THA, ant approach so edema expected Instructed in elevating R LE with ankle over heart, for 15 to 20 min intervals Inst in R ankle pumps vs black t band 20 x while in elevated position Inst in supine R heel slides with sheet or theraband for assistance to improve movement R knee Inst in seated R knee long arc quads with black t band assistance 20x  Provided instruction with medbridge, see below  09/17/24 Modified CRT for R ear x 2.  Patient is seated on mat table with R foot propped on stool for the maneuver.  She is able to lie back over 2 pillows for initial phase.   Then able to roll over to the L side with assist for her RLE due to the recent THA.        Canalith Repositioning:  Epley Right: Number of Reps: 2 Gaze Adaptation:   Habituation:    PATIENT EDUCATION: Education details: avoiding looking down next 24 hours, sleeping in recliner Person educated: Patient Education method: Explanation Education comprehension: verbalized understanding  HOME EXERCISE PROGRAM: Access Code: [email or web address] URL: [email or web address] Date: 10/01/2024 Prepared by: Garnette Montclair  Exercises - Shoulder External Rotation and Scapular Retraction with Resistance  - 1 x daily - 7 x weekly - 2 sets - 10 reps - Standing Shoulder Row with Anchored Resistance  - 1 x daily - 7 x weekly - 2 sets - 10 reps - Shoulder extension with resistance - Neutral  - 1 x daily - 7 x weekly - 2 sets - 10 reps - Standing Shoulder Horizontal Abduction with Resistance  - 1 x daily - 7 x weekly - 2 sets - 10 reps - Standing Gaze Stabilization with Head Rotation  - 1 x daily - 7 x weekly - 3 sets - 10 reps - Standing Gaze Stabilization with Head Nod  - 1 x daily  - 7 x weekly - 3 sets - 10 reps  Goals reviewed with patient? Yes  SHORT TERM GOALS: Target date: 10/15/24  Patient will report 25% subjective improvement in dizziness Baseline: Goal status: INITIAL  2.  Patient will be able to transfer sit to and from supine independently Baseline: min assist Goal status: INITIAL  3.  Patient will be independent with advanced HEP  Baseline:  no HEP provided yet  Goal status:  INITIAL  LONG TERM GOALS: Target date: 11/12/24  Patient will report 75-100% subjective improvement in dizziness Baseline:  Goal status: INITIAL  2.  Patient will be able to get up and down from bed and roll to either side with min to no symptoms of dizziness Baseline:  Goal status: INITIAL  3.  Patient will ambulate with least assistive device  independently with no pain and no LOB Baseline: walker dependent with SBA in community Goal status: INITIAL  4.  Patient will score >/= 19 on DGI to decrease fall risk Baseline: TBD Goal status: INITIAL  5.  DHI outcome score will improve to </= 5 to demonstrate improved QOL  Baseline:  22  Goal status:  INITIAL  6.  New goal for R knee for 09/25/24: normal R quads strength and pain free  7. New goal 10/01/24:  Patient will report 50-75% subjective improvement in R shoulder pain  Baseline:  5/10  Goal Status:  INITIAL  8.  New goal 10/01/24  Patient will demo equal ROM B shoulders to be able to reach behind her back for hygiene and self care/dressing  Baseline:  see ROM tables above  Goal status:  INITIAL  9. New Goal 10/01/24:  Patient will improve R shoulder strength to 4/5 to have sufficient strength to do light housework Baseline:  see MMT tables above Goal status:  INITIAL ASSESSMENT:  CLINICAL IMPRESSION:  The patient returned today for physical therapy intervention and her vertigo was not influencing her today, so we addressed her R knee musculature weakness and R shoulder dysfunction, advanced her  strengthening ex for B LE stability and periscapular musculature.  She tolerated well.  Quite weak R LE throughout.  We avoided excessive R hip extension and abduction due to recent ant THA.  She should continue to benefit from PT to address her goals.   Initial evaluation: Patient is a 63 y.o.  who was seen today for physical therapy evaluation and treatment for vertigo.   She reports having difficulty with getting up and down out of bed due to the room spinning.   States room is still spinning after she sits EOB for a while and that she falls to the L against the wall when she finally does get up to go the bathroom.   She has not fallen to the ground yet, but is afraid that she might.   She just had a R THA about a month ago so she does not need to fall.   She reports the problem started almost a year ago when she had water put into her ears by an audiologist.  She has had the vertigo off and on since that time, but that has gotten much worse in the last month.   She is using a walker for balance and is some unsteady with turning and direction changes.  Gait is slow and guarded, but she states she does not get dizzy walking.   She does have some difficulty with VOR cancellation and with smooth pursuits and nystagmus is noted with both of these tests.   She has a positive hallpike Dix especially to the R.   CRT is done for R ear today.   She may also have a hypofunction problem as well.   PT is necessary for dizziness, HEP and balance deficits.   She is agreeable to the POC  OBJECTIVE IMPAIRMENTS: difficulty walking, dizziness, and pain.   ACTIVITY LIMITATIONS: bending, sleeping, transfers, and locomotion level  PARTICIPATION LIMITATIONS: cleaning, laundry, and community activity  PERSONAL FACTORS: Age, Fitness, and 1-2 comorbidities: OA, DM, moderate asthma, deafness, recent anterior THA by Dr Jerri, are also affecting patient's functional outcome.   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     Leeloo Silverthorne L Esa Raden, PT,DPT, OCS  10/09/2024, 4:32 PM

## 2024-10-14 ENCOUNTER — Ambulatory Visit

## 2024-10-16 ENCOUNTER — Other Ambulatory Visit: Payer: Self-pay

## 2024-10-16 ENCOUNTER — Ambulatory Visit

## 2024-10-16 DIAGNOSIS — M6281 Muscle weakness (generalized): Secondary | ICD-10-CM

## 2024-10-16 DIAGNOSIS — M25561 Pain in right knee: Secondary | ICD-10-CM

## 2024-10-16 DIAGNOSIS — R293 Abnormal posture: Secondary | ICD-10-CM

## 2024-10-16 DIAGNOSIS — R269 Unspecified abnormalities of gait and mobility: Secondary | ICD-10-CM

## 2024-10-16 DIAGNOSIS — R42 Dizziness and giddiness: Secondary | ICD-10-CM

## 2024-10-16 DIAGNOSIS — R2689 Other abnormalities of gait and mobility: Secondary | ICD-10-CM

## 2024-10-16 DIAGNOSIS — M25511 Pain in right shoulder: Secondary | ICD-10-CM

## 2024-10-16 NOTE — Therapy (Signed)
 OUTPATIENT PHYSICAL THERAPY VESTIBULAR/SHOULDER/LE TREATMENT     Patient Name: Rhonda Morrow MRN: [id number] DOB:04/18/1961, 63 y.o., female Today's Date: 10/16/2024  END OF SESSION:  PT End of Session - 10/16/24 1448     Visit Number 6    Date for Recertification  11/12/24    Authorization Type [employer]    PT Start Time 1445    PT Stop Time 1530    PT Time Calculation (min) 45 min    Activity Tolerance Patient tolerated treatment well;No increased pain    Behavior During Therapy WFL for tasks assessed/performed              Past Medical History:  Diagnosis Date   Arthritis    Asthma    Complication of anesthesia    one time woke up and was vey scared,17 yrs ago   Deaf    Depression    Diabetes mellitus without complication (HCC)    GERD (gastroesophageal reflux disease)    Hypertension    Left groin pain    Nausea with vomiting 02/19/2014   Thyroid  disease    Past Surgical History:  Procedure Laterality Date   CARDIAC CATHETERIZATION     CESAREAN SECTION     CHOLECYSTECTOMY N/A 06/20/2016   Procedure: LAPAROSCOPIC CHOLECYSTECTOMY;  Surgeon: Donnice Bury, MD;  Location: [facility] OR;  Service: General;  Laterality: N/A;   ESOPHAGEAL MANOMETRY N/A 09/29/2013   Procedure: ESOPHAGEAL MANOMETRY (EM);  Surgeon: Toribio SHAUNNA Cedar, MD;  Location: [facility] ENDOSCOPY;  Service: Endoscopy;  Laterality: N/A;   TOTAL HIP ARTHROPLASTY Left 02/21/2017   Procedure: LEFT TOTAL HIP ARTHROPLASTY ANTERIOR APPROACH;  Surgeon: Kay CHRISTELLA Cummins, MD;  Location: [facility] OR;  Service: Orthopedics;  Laterality: Left;   TOTAL HIP ARTHROPLASTY Right 08/14/2024   Procedure: ARTHROPLASTY, HIP, TOTAL, ANTERIOR APPROACH;  Surgeon: Cummins Kay CHRISTELLA, MD;  Location: [facility] ORS;  Service: Orthopedics;  Laterality: Right;   Patient Active Problem List   Diagnosis Date Noted   Status post total replacement of right hip 08/14/2024   Gout 06/02/2024   Hearing loss 06/02/2024   Mastoid disorder, unspecified laterality  04/01/2024   Irritable bowel syndrome with both constipation and diarrhea 07/20/2023   Urinary incontinence 01/31/2023   Vaginal itching 01/31/2023   Influenza 11/20/2022   Postmenopausal bleeding 08/18/2022   Acute non-recurrent pansinusitis 06/18/2022   Leg cramps 03/08/2022   Muscle spasm of right lower extremity 03/08/2022   Unilateral primary osteoarthritis, right hip 03/01/2022   ACE inhibitor intolerance 01/19/2022   Weakness 12/22/2021   Left lower quadrant abdominal pain 12/22/2021   Slow transit constipation 12/22/2021   Class 3 severe obesity due to excess calories without serious comorbidity with body mass index (BMI) of 40.0 to 44.9 in adult (HCC) 07/05/2021   Restless leg syndrome 07/05/2021   Chronic seasonal allergic rhinitis due to pollen 07/05/2021   Psychophysiological insomnia 07/05/2021   Non-compliance 07/05/2021   COVID-19 07/05/2021   Right wrist pain 06/05/2021   Acute pain of left shoulder 01/24/2021   Acute pain of right shoulder 01/24/2021   Foot pain, bilateral 01/24/2021   Dyspepsia 01/24/2021   Preop cardiovascular exam 01/24/2021   Right hand pain 12/29/2020   OSA (obstructive sleep apnea) 09/12/2020   Nocturia more than twice per night 08/03/2020   Non-restorative sleep 08/03/2020   Leg cramping 08/03/2020   Hyperlipidemia associated with type 2 diabetes mellitus (HCC) 07/25/2020   Large breasts 07/22/2020   RLS (restless legs syndrome) 05/17/2020   Cough 04/01/2020  Asthma, moderate persistent, poorly-controlled 04/01/2020   Medication management 04/01/2020   Menopausal symptoms 03/29/2020   Hot flashes 03/29/2020   Chronic right shoulder pain 08/11/2019   Acute non-recurrent maxillary sinusitis 08/11/2019   BMI 40.0-44.9, adult (HCC) 01/22/2019   Right lower quadrant abdominal pain 10/20/2018   Rectal pain 10/20/2018   Vitamin B 12 deficiency 03/28/2018   B12 deficiency 03/28/2018   DM (diabetes mellitus) type II uncontrolled, periph  vascular disorder 03/28/2018   Hyperlipidemia 03/28/2018   Primary osteoarthritis of left hip 02/21/2017   Status post total hip replacement, left 02/21/2017   History of laparoscopic cholecystectomy 06/20/2016   Cerumen impaction 06/09/2016   Hypersomnia 05/16/2016   Knee pain, right 05/05/2016   RUQ pain 04/09/2016   Abnormal CT scan 03/28/2016   Dyspnea and respiratory abnormalities 03/15/2016   Asthma with acute exacerbation 03/15/2016   Asthma in adult 02/25/2016   DM (diabetes mellitus) type II controlled, neurological manifestation (HCC) 02/09/2016   Right hip pain 12/23/2015   Right hamstring muscle strain 11/18/2015   Abdominal pain, acute 09/01/2015   Acute asthma exacerbation 04/16/2015   Acute bronchitis 04/14/2015   Pap smear for cervical cancer screening 09/14/2014   Bed bug bite 05/06/2014   Allergic rhinitis 04/09/2014   Rectal itching 04/09/2014   Bladder spasm 04/09/2014   Knee pain, left 03/05/2014   Hypokalemia 02/19/2014   Nausea with vomiting 02/19/2014   Diabetes mellitus, type II (HCC) 02/19/2014   Edema 02/16/2014   Disorder of rotator cuff 11/12/2013   Routine general medical examination at a health care facility 08/20/2013   Gastroesophageal reflux disease 06/26/2013   Dysphagia, pharyngoesophageal phase 06/26/2013   Suprapubic abdominal pain 05/12/2013   Medication side effect 03/25/2013   Diarrhea 03/25/2013   Benign positional vertigo 01/30/2013   Traumatic hematoma of thigh 01/15/2013   HTN (hypertension) 09/02/2012   Dry skin 08/22/2012   External hemorrhoids 08/22/2012   Obesity 06/30/2012   Thyromegaly 05/22/2012   Back pain 05/22/2012   Elevated glucose 05/22/2012   Moderate persistent asthma 04/19/2012   Dyspnea 01/28/2012    PCP: Dr Antonio  REFERRING PROVIDER:Edward Saguier, Yolande Lloyd, MD  REFERRING DIAG: R42 (ICD-10-CM) - Vertigo/R knee and R shoulder pain  THERAPY DIAG:   Muscle weakness (generalized)  Abnormality  of gait and mobility  Dizziness and giddiness  Acute pain of right shoulder  Abnormal posture  Acute pain of right knee  Other abnormalities of gait and mobility  ONSET DATE:   Rationale for Evaluation and Treatment: Rehabilitation  SUBJECTIVE:   SUBJECTIVE STATEMENT: 10/16/24:  Patient states feels well, no problem with vertigo.walking without cane now for a few days.  Not painful R knee still some weakness with sit to stand in restaurants 63 y/o patient referred to PT from PCP for vertigo.  Of note, the patient is deaf and communicates via sign language and an interpreter is present for this visit.     She reports she went to her audiologist almost a year ago and when they shot water in her ears she got very dizzy.   The symptoms went away, but have returned off and on since that time until recent week or so when they have become severe. Symptoms are worst when getting up from bed to go to the bathroom at night.   She falls to the L.  She denies ever having this problem before going to the audiologist. She lives with her spouse who is also deaf.   Unknown how his  health is.   Patient comes to clinic using a walker.    Of note, she had an anterior R THA a month ago by Dr Jerri, and has completed her [facility] PT for this issue.  She would like PT for this as well but we do not have any orders from Dr Jerri.   Will request orders per patient request  Pt accompanied by: self  PERTINENT HISTORY: OA, DM, moderate asthma, deafness, recent anterior THA by Dr Jerri,   PAIN:  Are you having pain? Yes: NPRS scale: 4/10 now Pain location: R hip anterior Pain description: achy Aggravating factors: lifting her leg into bed or lying down Relieving factors: nonoffending positions  PRECAUTIONS: Anterior hip  RED FLAGS: None   WEIGHT BEARING RESTRICTIONS: No  FALLS: Has patient fallen in last 6 months? Yes. Number of falls 1  LIVING ENVIRONMENT: Lives with: lives with their family, lives with their  spouse, and lives with their son Lives in: House/apartment Stairs: Yes: External: several steps; unknown Has following equipment at home: Walker - 2 wheeled  PLOF: Independent with gait  PATIENT GOALS: walk better, get rid of the dizziness  OBJECTIVE:  Note: Objective measures were completed at Evaluation unless otherwise noted.  DIAGNOSTIC FINDINGS: EXAM: EXAM: 1 VIEW(S) XRAY OF THE RIGHT SHOULDER 09/24/2024 12:01:25 PM   COMPARISON: None available.   CLINICAL HISTORY: right shoulder pain   FINDINGS:   BONES AND JOINTS: No acute fracture or dislocation. Degenerative changes of the acromioclavicular and glenohumeral joints   SOFT TISSUES: No abnormal calcifications. Visualized lung is unremarkable.   IMPRESSION: 1. Degenerative changes without acute abnormality.   Electronically signed by: Oneil Devonshire MD 09/28/2024 07:13 PM EST RP Workstation: MYRTICE EXAM: 3 VIEW(S) XRAY OF THE RIGHT KNEE 09/24/2024 12:01:25 PM   COMPARISON: None available.   CLINICAL HISTORY: right knee pain   FINDINGS:   BONES AND JOINTS: No acute fracture. No significant joint effusion. Enthesopathic changes at the patella. Mild medial compartment joint space narrowing.   SOFT TISSUES: The soft tissues are unremarkable.   IMPRESSION: 1. Degenerative change without acute abnormality.   Electronically signed by: Oneil Devonshire MD 09/28/2024 07:12 PM EST RP Workstation: [id number]    MRI BRAIN WITHOUT CONTRAST 03/26/2024 09:24:46 PM   TECHNIQUE: Multiplanar multisequence MRI of the head/brain was performed without the administration of intravenous contrast.   COMPARISON: CT head without contrast 07/31/2019.   CLINICAL HISTORY: Vertigo.   FINDINGS:   BRAIN AND VENTRICLES: No acute infarct. No intracranial hemorrhage. No mass. No midline shift. No hydrocephalus. The sella is unremarkable. Normal flow voids. Scattered subcortical T2 hyperintensities bilaterally are  moderately advanced for age.   ORBITS: No acute abnormality.   SINUSES AND MASTOIDS: Mastoid effusion is again noted. No obstructing nasopharyngeal lesion is present.   BONES AND SOFT TISSUES: Normal marrow signal. No acute soft tissue abnormality. Mild degenerative changes are present in the upper cervical spine.   IMPRESSION: 1. No acute intracranial abnormality related to vertigo. 2. Moderately advanced scattered subcortical T2 hyperintensities bilaterally for age. This likely reflects the sequelae of chronic microvascular ischemia. 3. Mastoid effusion, again noted. No obstructing nasopharyngeal lesion.   Electronically signed by: Lonni Necessary MD 03/26/2024 09:34 PM EDT RP Workstation: [id number]  COGNITION: Overall cognitive status: Within functional limits for tasks assessed   SENSATION: WFL  EDEMA:  Mild R thigh  MUSCLE TONE:    DTRs:    POSTURE:  rounded shoulders and forward head  Cervical ROM:    Active  A/PROM (deg) eval  Flexion 100%  Extension 50%  Right lateral flexion 50%  Left lateral flexion 65%  Right rotation 80%  Left rotation 80%  (Blank rows = not tested)  UPPER EXTREMITY ROM:  Active ROM Right eval Left eval  Shoulder flexion 160; p! 180  Shoulder extension    Shoulder abduction 150 180  Shoulder adduction    Shoulder extension    Shoulder internal rotation PSIS L1  Shoulder external rotation 50 65  Elbow flexion    Elbow extension    Wrist flexion    Wrist extension    Wrist ulnar deviation    Wrist radial deviation    Wrist pronation    Wrist supination     (Blank rows = not tested)   UPPER EXTREMITY MMT:  MMT Right eval Left eval  Shoulder flexion 4-; pain 5  Shoulder extension    Shoulder abduction 4-; pain 5  Shoulder adduction    Shoulder extension    Shoulder internal rotation 4+ 5  Shoulder external rotation 4- 4+  Middle trapezius    Lower trapezius    Elbow flexion    Elbow extension     Wrist flexion    Wrist extension    Wrist ulnar deviation    Wrist radial deviation    Wrist pronation    Wrist supination    Grip strength     (Blank rows = not tested)    STRENGTH: nt  LOWER EXTREMITY MMT:   MMT Right eval Left eval  Hip flexion    Hip abduction    Hip adduction    Hip internal rotation    Hip external rotation    Knee flexion    Knee extension    Ankle dorsiflexion    Ankle plantarflexion    Ankle inversion    Ankle eversion    (Blank rows = not tested)  BED MOBILITY:  Sit to supine Min A Supine to sit Min A  TRANSFERS: Assistive device utilized: [employer]ant - 2 wheeled  Sit to stand: SBA Stand to sit: SBA Chair to chair: SBA Floor: NT  GAIT: Gait pattern: decreased step length- Left and decreased stance time- Right Distance walked: into clinic x 150' Assistive device utilized: Environmental Consultant - 2 wheeled Level of assistance: SBA Comments:   FUNCTIONAL TESTS:  TBD  PATIENT SURVEYS:  DHI: THE DIZZINESS HANDICAP INVENTORY (DHI)  P1. Does looking up increase your problem? 2 = Sometimes  E2. Because of your problem, do you feel frustrated? 2 = Sometimes  F3. Because of your problem, do you restrict your travel for business or recreation?  0 = No  P4. Does walking down the aisle of a supermarket increase your problems?  0 = No  F5. Because of your problem, do you have difficulty getting into or out of bed?  4 = Yes  F6. Does your problem significantly restrict your participation in social activities, such as going out to dinner, going to the movies, dancing, or going to parties? 0 = No  F7. Because of your problem, do you have difficulty reading?  0 = No  P8. Does performing more ambitious activities such as sports, dancing, household chores (sweeping or putting dishes away) increase your problems?  2 = Sometimes  E9. Because of your problem, are you afraid to leave your home without having without having someone accompany you?  0 = No  E10. Because  of your problem have you been embarrassed in front of others?  0 = No  P11. Do quick movements of your head increase your problem?  0 = No  F12. Because of your problem, do you avoid heights?  0 = No  P13. Does turning over in bed increase your problem?  4 = Yes  F14. Because of your problem, is it difficult for you to do strenuous homework or yard work? 2 = Sometimes  E15. Because of your problem, are you afraid people may think you are intoxicated? 0 = No  F16. Because of your problem, is it difficult for you to go for a walk by yourself?  0 = No  P17. Does walking down a sidewalk increase your problem?  0 = No  E18.Because of your problem, is it difficult for you to concentrate 0 = No  F19. Because of your problem, is it difficult for you to walk around your house in the dark? 2 = Sometimes  E20. Because of your problem, are you afraid to stay home alone?  0 = No  E21. Because of your problem, do you feel handicapped? 0 = No  E22. Has the problem placed stress on your relationships with members of your family or friends? 0 = No  E23. Because of your problem, are you depressed?  0 = No  F24. Does your problem interfere with your job or household responsibilities?  2 = Sometimes  P25. Does bending over increase your problem?  2 = Sometimes  TOTAL 22    DHI Scoring Instructions  The patient is asked to answer each question as it pertains to dizziness or unsteadiness problems, specifically  considering their condition during the last month. Questions are designed to incorporate functional (F), physical  (P), and emotional (E) impacts on disability.   Scores greater than 10 points should be referred to balance specialists for further evaluation.   16-34 Points (mild handicap)  36-52 Points (moderate handicap)  54+ Points (severe handicap)  Minimally Detectable Change: 17 points ([address] [place], 1990)  [place], G. SHAUNNA. and [place], C. W. (1990). The development of the Dizziness  Handicap Inventory. Archives of Otolaryngology - Head and Neck Surgery 116(4): [id number].   VESTIBULAR ASSESSMENT:  GENERAL OBSERVATION:    SYMPTOM BEHAVIOR:  Subjective history: see above note  Non-Vestibular symptoms: none  Type of dizziness: Spinning/Vertigo and Funny feeling in the head  Frequency: whenever getting up/down from bed  Duration: several minutes to an hour  Aggravating factors: Induced by position change: lying supine, rolling to the right, rolling to the left, and supine to sit  Relieving factors: head stationary  Progression of symptoms: worse  OCULOMOTOR EXAM:  Ocular Alignment: normal  Ocular ROM: No Limitations  Spontaneous Nystagmus: absent  Gaze-Induced Nystagmus: left beating with left gaze  Smooth Pursuits: saccades  Saccades: extra eye movements  Convergence/Divergence:  cm   Cover-cross-cover test: NT  VESTIBULAR - OCULAR REFLEX:   Slow VOR: Comment: some nystagmus slow r, fast L  VOR Cancellation: Unable to Maintain Gaze  Head-Impulse Test:  NT  Dynamic Visual Acuity: NT   POSITIONAL TESTING: Right Dix-Hallpike: no obvious nystagmus, but dizziness Left Dix-Hallpike: no noted nystagmus, a little dizzy  MOTION SENSITIVITY:  Motion Sensitivity Quotient Intensity: 0 = none, 1 = Lightheaded, 2 = Mild, 3 = Moderate, 4 = Severe, 5 = Vomiting  Intensity  1. Sitting to supine   2. Supine to L side   3. Supine to R side   4. Supine to sitting   5. L Hallpike-Dix  6. Up from L    7. R Hallpike-Dix   8. Up from R    9. Sitting, head tipped to L knee   10. Head up from L knee   11. Sitting, head tipped to R knee   12. Head up from R knee   13. Sitting head turns x5   14.Sitting head nods x5   15. In stance, 180 turn to L    16. In stance, 180 turn to R     OTHOSTATICS: not done  FUNCTIONAL GAIT: TBD                                                                                                                             TREATMENT  DATE:  10/16/24: Recumbent bike x L 3,  DGI testing B hamstring curls 15# 3 x 12 B knee ext 5# 3 x 12 Seated rows low hands  15# 3 x 12 Seated B shoulder ER with yellow t band 15 x  Paloff press green t band 15 reps each direction  10/09/24: Nustep L 5 x 6 min B knee flexion 20# 12 x 2 B knee ext 5# x 2 x 12 Seated rows 20# , 2 x 12  Paloff press green t band 15 reps each direction On 4 step for B heel drops, and B heel raises 10 x 2 Forward step ups leading with R LE x 15 reps  Gait with st cane, 90', adjusted height downward, pt using cane in R hand.  10/07/24: Cheked for BPPV: + horizontal Sx L  + R Dix halpike for dizziness Rx of one bout of Epley maneuver for R posterior canalithiasis, with improvement and - sx upn retesting.    Therex: Performed ex as instructed last session:  Seated rowing blue TB x 20 BUE Seated shoulder ext RTB x 20 BUE Seated shoulder ER YTB x 20 BUE Seated shoulder HABD x 20 BUE  Seated long arc quads 3# cuff wt ankle 3 x 10     10/01/24 THERAPEUTIC EXERCISE: To improve strength, endurance, and ROM.  Demonstration, verbal and tactile cues throughout for technique. NuStep L3 x 6'  R shoulder evaluation: subjective/imaging results/ROM/strength measures above  special tests:  + Neer, GLENWOOD Vonzell Berber, + lift off,  TTP over the R UT, infraspinatus, greater tuberosity, coracoid, bicipital groove  THERAPEUTIC ACTIVITIES: To improve functional performance.  Demonstration, verbal and tactile cues throughout for technique. Seated rowing blue TB x 20 BUE Seated shoulder ext RTB x 20 BUE Seated shoulder ER YTB x 20 BUE Seated shoulder HABD x 20 BUE  Corner balance:   Tandem stance BLE x 1' each  Standing Gaze stabilization x 10 head turns, x10 head nods  09/25/24:  Abbreviated session due to 10 min late.  Evaluated R knee pain Gait with R walker, antalgic on R , initial contact on R forefoot, avoids heel strike, maintains R knee stiff at 20  degree bend.  able to walk through clinic up to 80' Lower Extremity Functional Score: 8 / 80 = 10.0 % MMT R LE : pain R post knee and 3+/5 R quads, hamstrings wfl ROM : R knee flexion 100, ext wnl Palpation: edematous R thigh extending from hip to R foot, more prominent edema R thigh noted Treatment: today focused on decreasing edema R LE, pt 4-5 weeks post op R THA, ant approach so edema expected Instructed in elevating R LE with ankle over heart, for 15 to 20 min intervals Inst in R ankle pumps vs black t band 20 x while in elevated position Inst in supine R heel slides with sheet or theraband for assistance to improve movement R knee Inst in seated R knee long arc quads with black t band assistance 20x  Provided instruction with medbridge, see below  09/17/24 Modified CRT for R ear x 2.  Patient is seated on mat table with R foot propped on stool for the maneuver.  She is able to lie back over 2 pillows for initial phase.   Then able to roll over to the L side with assist for her RLE due to the recent THA.        Canalith Repositioning:  Epley Right: Number of Reps: 2 Gaze Adaptation:   Habituation:    PATIENT EDUCATION: Education details: avoiding looking down next 24 hours, sleeping in recliner Person educated: Patient Education method: Explanation Education comprehension: verbalized understanding  HOME EXERCISE PROGRAM: Access Code: [email or web address] URL: [email or web address] Date: 10/01/2024 Prepared by: Garnette Montclair  Exercises - Shoulder External Rotation and Scapular Retraction with Resistance  - 1 x daily - 7 x weekly - 2 sets - 10 reps - Standing Shoulder Row with Anchored Resistance  - 1 x daily - 7 x weekly - 2 sets - 10 reps - Shoulder extension with resistance - Neutral  - 1 x daily - 7 x weekly - 2 sets - 10 reps - Standing Shoulder Horizontal Abduction with Resistance  - 1 x daily - 7 x weekly - 2 sets - 10 reps - Standing Gaze Stabilization with  Head Rotation  - 1 x daily - 7 x weekly - 3 sets - 10 reps - Standing Gaze Stabilization with Head Nod  - 1 x daily - 7 x weekly - 3 sets - 10 reps  Goals reviewed with patient? Yes  SHORT TERM GOALS: Target date: 10/15/24  Patient will report 25% subjective improvement in dizziness Baseline: Goal status: 10/16/24: met  2.  Patient will be able to transfer sit to and from supine independently Baseline: min assist Goal status: 10/16/24: met  3.  Patient will be independent with advanced HEP  Baseline:  no HEP provided yet  Goal status:  INITIAL  LONG TERM GOALS: Target date: 11/12/24  Patient will report 75-100% subjective improvement in dizziness Baseline:  Goal status: 10/16/24: met  2.  Patient will be able to get up and down from bed and roll to either side with min to no symptoms of dizziness Baseline:  Goal status: 10/16/24 met  3.  Patient will ambulate with least assistive device  independently with no pain and no LOB Baseline: walker dependent with SBA in community Goal status: met walking without device today 10/16/24  4.  Patient will score >/= 19 on DGI to decrease fall risk Baseline: TBD Goal status: 10/16/24: met 20  5.  DHI outcome score will improve to </= 5 to demonstrate improved QOL  Baseline:  22  Goal status:  0 today  6.  New goal for R knee for 09/25/24: normal R quads strength and pain free 10/16/24: wnl with burst testing R quads, hams  7. New goal 10/01/24:  Patient will report 50-75% subjective improvement in R shoulder pain   Baseline:  5/10  Goal Status:  10/16/24: 5/10 today no change  8.  New goal 10/01/24  Patient will demo equal ROM B shoulders to be able to reach behind her back for hygiene and self care/dressing  Baseline:  see ROM tables above  Goal status:  10/16/24 Met today  9. New Goal 10/01/24:  Patient will improve R shoulder strength to 4/5 to have sufficient strength to do light housework Baseline:  see MMT tables  above Goal status:  met today 10/16/24 ASSESSMENT:  CLINICAL IMPRESSION:  The patient returned today for physical therapy intervention and upon assessment of her goals she has met all , except still reports pain R shoulder 5/10, although strength and her ability to perform resistance training R shoulder did not increase her pain.  She scored 20/24 on the DGI.  She was able to walk today without her cane with very minimal gait adaptations.     Initial evaluation: Patient is a 63 y.o.  who was seen today for physical therapy evaluation and treatment for vertigo.   She reports having difficulty with getting up and down out of bed due to the room spinning.   States room is still spinning after she sits EOB for a while and that she falls to the L against the wall when she finally does get up to go the bathroom.   She has not fallen to the ground yet, but is afraid that she might.   She just had a R THA about a month ago so she does not need to fall.   She reports the problem started almost a year ago when she had water put into her ears by an audiologist.  She has had the vertigo off and on since that time, but that has gotten much worse in the last month.   She is using a walker for balance and is some unsteady with turning and direction changes.  Gait is slow and guarded, but she states she does not get dizzy walking.   She does have some difficulty with VOR cancellation and with smooth pursuits and nystagmus is noted with both of these tests.   She has a positive hallpike Dix especially to the R.   CRT is done for R ear today.   She may also have a hypofunction problem as well.   PT is necessary for dizziness, HEP and balance deficits.   She is agreeable to the POC  OBJECTIVE IMPAIRMENTS: difficulty walking, dizziness, and pain.   ACTIVITY LIMITATIONS: bending, sleeping, transfers, and locomotion level  PARTICIPATION LIMITATIONS: cleaning, laundry, and community activity  PERSONAL FACTORS: Age,  Fitness, and 1-2 comorbidities: OA, DM, moderate asthma, deafness, recent anterior THA by Dr Jerri, are also affecting patient's functional outcome.   REHAB POTENTIAL: Good  CLINICAL DECISION MAKING: Evolving/moderate complexity  EVALUATION COMPLEXITY: Moderate   PLAN:  PT FREQUENCY: 1-2x/week  PT DURATION: 8 weeks  PLANNED INTERVENTIONS: 97164- PT Re-evaluation, 97750- Physical Performance Testing, 97110-Therapeutic exercises, 97530- Therapeutic activity, [id number]- Neuromuscular re-education, 97535- Self Care, 02859- Manual therapy, [id number]- Gait training, [phone]- Canalith repositioning, Patient/Family education, and Stair training  PLAN FOR NEXT SESSION:  DC at this point as she has met her goals Lilian Fuhs L  Lucero Auzenne, PT,DPT, OCS  10/16/2024, 3:41 PM

## 2024-10-17 ENCOUNTER — Other Ambulatory Visit: Payer: Self-pay | Admitting: Family Medicine

## 2024-10-17 ENCOUNTER — Encounter: Payer: Self-pay | Admitting: Family

## 2024-10-17 DIAGNOSIS — J40 Bronchitis, not specified as acute or chronic: Secondary | ICD-10-CM

## 2024-10-17 MED ORDER — PROMETHAZINE-DM 6.25-15 MG/5ML PO SYRP: 5.0000 mL | ORAL_SOLUTION | Freq: Four times a day (QID) | ORAL | 0 refills | Status: AC | PRN

## 2024-10-17 NOTE — Telephone Encounter (Signed)
 Copied from CRM #[id number]. Topic: Clinical - Prescription Issue >> Oct 17, 2024  1:18 PM Rea C wrote: Reason for CRM: promethazine -dextromethorphan (PROMETHAZINE -DM) 6.25-15 MG/5ML syrup  Patient called in and asked if another script can be sent in because her nephew threw away her prescription by accident. She also sent a message in through MyChart. Patient is also asking if it could possibly be sent in today and noted that the pharmacy closes today at 6. NP Melissa called in the prescription for her on 12/02.    [facility] [address], [address] [place] [address] Phone: [phone]  Fax: [phone] >> Oct 17, 2024  1:38 PM Nurse Kaye LABOR, RN wrote: Duplicate request. Pt send mychart message.

## 2024-10-19 ENCOUNTER — Other Ambulatory Visit: Payer: Self-pay | Admitting: Physician Assistant

## 2024-10-20 NOTE — Telephone Encounter (Signed)
 approve

## 2024-10-21 ENCOUNTER — Ambulatory Visit: Admitting: Rehabilitation

## 2024-10-21 ENCOUNTER — Other Ambulatory Visit: Payer: Self-pay | Admitting: Family Medicine

## 2024-10-21 NOTE — Telephone Encounter (Signed)
 Copied from CRM #[id number]. Topic: Clinical - Medication Refill >> Oct 21, 2024  1:49 PM Deleta S wrote: Medication: traMADol  (ULTRAM ) 50 MG tablet and refill on inhaler please contact patient for details  Has the patient contacted their pharmacy? No (Agent: If no, request that the patient contact the pharmacy for the refill. If patient does not wish to contact the pharmacy document the reason why and proceed with request.) (Agent: If yes, when and what did the pharmacy advise?)  This is the patient's preferred pharmacy:  [id number] [address], [place] - [address] [place] [address] Phone: [phone] Fax: [phone]  Is this the correct pharmacy for this prescription? Yes If no, delete pharmacy and type the correct one.   Has the prescription been filled recently? Yes  Is the patient out of the medication? Yes  Has the patient been seen for an appointment in the last year OR does the patient have an upcoming appointment? Yes  Can we respond through MyChart? Yes  Agent: Please be advised that Rx refills may take up to 3 business days. We ask that you follow-up with your pharmacy.

## 2024-10-21 NOTE — Telephone Encounter (Signed)
 Tramadol  is prescribed by Dr Jerri at ortho. Error crm sent.

## 2024-10-23 ENCOUNTER — Ambulatory Visit (INDEPENDENT_AMBULATORY_CARE_PROVIDER_SITE_OTHER): Admitting: Family Medicine

## 2024-10-23 ENCOUNTER — Ambulatory Visit

## 2024-10-23 ENCOUNTER — Encounter: Payer: Self-pay | Admitting: Family Medicine

## 2024-10-23 VITALS — BP 133/48 | HR 95 | Temp 97.9°F | Ht 64.0 in | Wt 203.2 lb

## 2024-10-23 DIAGNOSIS — J4540 Moderate persistent asthma, uncomplicated: Secondary | ICD-10-CM | POA: Diagnosis not present

## 2024-10-23 DIAGNOSIS — L299 Pruritus, unspecified: Secondary | ICD-10-CM

## 2024-10-23 MED ORDER — ALBUTEROL SULFATE HFA 108 (90 BASE) MCG/ACT IN AERS: 1.0000 | INHALATION_SPRAY | Freq: Four times a day (QID) | RESPIRATORY_TRACT | 3 refills | Status: DC | PRN

## 2024-10-23 NOTE — Progress Notes (Signed)
 Acute Office Visit  Subjective:  Patient ID: Rhonda Morrow, female    DOB: Oct 23, 1961  Age: 63 y.o. MRN: [id number]  CC:  Chief Complaint  Patient presents with   itching    Onset -about a month - itching all over - changed soap to [place] bodywash   Medication Refill    Patient stated she needs refills on inhalers      HPI Rhonda Morrow is here for itching. She is here with an interpreter.    Discussed the use of AI scribe software for clinical note transcription with the patient, who gave verbal consent to proceed.  History of Present Illness Rhonda Morrow is a 63 year old female who presents with generalized itching.  She has been experiencing generalized itching for the past three to four weeks, affecting her back, chest, abdomen, and legs. The itching occurs throughout the day and feels like 'there's something wrong'. Scratching provides some relief. Her skin feels dry, and the itching feels as though it is 'underneath the skin'.  She completed a course of tramadol  for pain management following hip surgery, along with a blood clot prevention medication and a stool softener. She last took tramadol  around November 11th, which did not exacerbate the itching.  She has not changed her laundry detergent, continuing to use Tide, but mentions using different scents of Dove soap.   She takes hot showers but does not soak in a tub due to her hip surgery.        Past Medical History:  Diagnosis Date   Arthritis    Asthma    Complication of anesthesia    one time woke up and was vey scared,17 yrs ago   Deaf    Depression    Diabetes mellitus without complication (HCC)    GERD (gastroesophageal reflux disease)    Hypertension    Left groin pain    Nausea with vomiting 02/19/2014   Thyroid  disease     Past Surgical History:  Procedure Laterality Date   CARDIAC CATHETERIZATION     CESAREAN SECTION     CHOLECYSTECTOMY N/A 06/20/2016   Procedure: LAPAROSCOPIC  CHOLECYSTECTOMY;  Surgeon: Donnice Bury, MD;  Location: [facility] OR;  Service: General;  Laterality: N/A;   ESOPHAGEAL MANOMETRY N/A 09/29/2013   Procedure: ESOPHAGEAL MANOMETRY (EM);  Surgeon: Toribio SHAUNNA Cedar, MD;  Location: [facility];  Service: Endoscopy;  Laterality: N/A;   TOTAL HIP ARTHROPLASTY Left 02/21/2017   Procedure: LEFT TOTAL HIP ARTHROPLASTY ANTERIOR APPROACH;  Surgeon: Kay CHRISTELLA Cummins, MD;  Location: [facility] OR;  Service: Orthopedics;  Laterality: Left;   TOTAL HIP ARTHROPLASTY Right 08/14/2024   Procedure: ARTHROPLASTY, HIP, TOTAL, ANTERIOR APPROACH;  Surgeon: Cummins Kay CHRISTELLA, MD;  Location: [facility];  Service: Orthopedics;  Laterality: Right;    Family History  Problem Relation Age of Onset   Diabetes Mother    Heart disease Mother    Diabetes Father    Colon cancer Neg Hx    Colon polyps Neg Hx    Esophageal cancer Neg Hx    Rectal cancer Neg Hx    Stomach cancer Neg Hx     Social History   Socioeconomic History   Marital status: Married    Spouse name: Not on file   Number of children: Not on file   Years of education: Not on file   Highest education level: 12th grade  Occupational History   Occupation: disabled  Tobacco Use   Smoking status: Never  Smokeless tobacco: Never  Vaping Use   Vaping status: Never Used  Substance and Sexual Activity   Alcohol  use: Yes    Alcohol /week: 1.0 standard drink of alcohol     Types: 1 Glasses of wine per week    Comment: occas   Drug use: No   Sexual activity: Not Currently  Other Topics Concern   Not on file  Social History Narrative   Lives with family.  Has 3 children.  Works for [employer]  Education: high school.   Social Drivers of Health   Tobacco Use: Low Risk (10/23/2024)   Patient History    Smoking Tobacco Use: Never    Smokeless Tobacco Use: Never    Passive Exposure: Not on file  Financial Resource Strain: Low Risk (10/21/2024)   Overall Financial Resource Strain (CARDIA)    Difficulty of Paying Living  Expenses: Not very hard  Food Insecurity: Food Insecurity Present (10/21/2024)   Epic    Worried About Programme Researcher, Broadcasting/film/video in the Last Year: Sometimes true    Ran Out of Food in the Last Year: Sometimes true  Transportation Needs: Unmet Transportation Needs (10/21/2024)   Epic    Lack of Transportation (Medical): Yes    Lack of Transportation (Non-Medical): Yes  Physical Activity: Sufficiently Active (10/21/2024)   Exercise Vital Sign    Days of Exercise per Week: 5 days    Minutes of Exercise per Session: 40 min  Stress: Stress Concern Present (10/21/2024)   [employer] of Occupational Health - Occupational Stress Questionnaire    Feeling of Stress: To some extent  Social Connections: Socially Integrated (10/21/2024)   Social Connection and Isolation Panel    Frequency of Communication with Friends and Family: Once a week    Frequency of Social Gatherings with Friends and Family: Twice a week    Attends Religious Services: 1 to 4 times per year    Active Member of [employer] or Organizations: Yes    Attends Banker Meetings: 1 to 4 times per year    Marital Status: Married  Catering Manager Violence: Not At Risk (08/14/2024)   Epic    Fear of Current or Ex-Partner: No    Emotionally Abused: No    Physically Abused: No    Sexually Abused: No  Depression (PHQ2-9): Medium Risk (06/13/2024)   Depression (PHQ2-9)    PHQ-2 Score: 6  Alcohol  Screen: Low Risk (10/21/2024)   Alcohol  Screen    Last Alcohol  Screening Score (AUDIT): 1  Housing: Unknown (10/21/2024)   Epic    Unable to Pay for Housing in the Last Year: No    Number of Times Moved in the Last Year: Not on file    Homeless in the Last Year: No  Utilities: Not At Risk (08/14/2024)   Epic    Threatened with loss of utilities: No  Health Literacy: Not on file    ROS All ROS negative except what is listed in the HPI.   Objective:   Today's Vitals: BP (!) 133/48   Pulse 95   Temp 97.9 F (36.6 C) (Oral)    Ht 5' 4 (1.626 m)   Wt 203 lb 3.2 oz (92.2 kg)   LMP 05/20/2012   SpO2 99%   BMI 34.88 kg/m   Physical Exam Vitals reviewed.  Constitutional:      General: She is not in acute distress.    Appearance: Normal appearance. She is not ill-appearing.  Skin:    Findings: No erythema  or rash.     Comments: Generalized dry skin  Neurological:     Mental Status: She is alert and oriented to person, place, and time.  Psychiatric:        Mood and Affect: Mood normal.        Behavior: Behavior normal.        Thought Content: Thought content normal.        Judgment: Judgment normal.        Assessment & Plan:   Problem List Items Addressed This Visit       Active Problems   Moderate persistent asthma   No current exacerbation. Requesting refill of albuterol .       Relevant Medications   albuterol  (VENTOLIN  HFA) 108 (90 Base) MCG/ACT inhaler   Other Visit Diagnoses       Generalized pruritus of unknown etiology    -  Primary      Assessment & Plan Pruritus Generalized itching without rash likely due to dry skin and irritants from soap and detergent. - Switch to gentle detergent and soap  - Avoid hot showers; use lukewarm water. - Pat skin dry with a towel. - Apply fragrance-free moisturizer after bathing. - Restart cetirizine  twice daily for a week if severe, then reduce to once daily. - Report rash development or lack of improvement within a week.       Follow-up: Return if symptoms worsen or fail to improve.   Waddell FURY Almarie, DNP, FNP-C  I,Emily Lagle,acting as a neurosurgeon for Waddell KATHEE Almarie, NP.,have documented all relevant documentation on the behalf of Waddell KATHEE Almarie, NP.   I, Waddell KATHEE Almarie, NP, have reviewed all documentation for this visit. The documentation on 10/23/2024 for the exam, diagnosis, procedures, and orders are all accurate and complete.

## 2024-10-23 NOTE — Assessment & Plan Note (Signed)
 No current exacerbation. Requesting refill of albuterol .

## 2024-10-23 NOTE — Patient Instructions (Signed)
 Try avoiding potential allergens. Use a gentle soap (Dove or Aveeno no fragrence - anything that says it is safe for eczema should be fine), and a gentle detergent (All free and clear, etc).   Gentle moisturizer - Eucerin, Aquaphor, etc  Can take the Zyrtec  twice daily for up to a week if itching is severe

## 2024-10-24 ENCOUNTER — Ambulatory Visit: Admitting: Family Medicine

## 2024-10-24 VITALS — BP 126/82 | HR 54 | Ht 64.0 in | Wt 203.0 lb

## 2024-10-24 DIAGNOSIS — L853 Xerosis cutis: Secondary | ICD-10-CM

## 2024-10-24 DIAGNOSIS — G8929 Other chronic pain: Secondary | ICD-10-CM

## 2024-10-24 DIAGNOSIS — M25561 Pain in right knee: Secondary | ICD-10-CM

## 2024-10-24 MED ORDER — TRAMADOL HCL 50 MG PO TABS: 50.0000 mg | ORAL_TABLET | Freq: Every day | ORAL | 0 refills | Status: AC | PRN

## 2024-10-24 MED ORDER — TRIAMCINOLONE ACETONIDE 0.1 % EX CREA: 1.0000 | TOPICAL_CREAM | Freq: Two times a day (BID) | CUTANEOUS | 12 refills | Status: AC

## 2024-10-24 NOTE — Patient Instructions (Addendum)
 Thank you for coming in today.   Continue home exercises.   Recheck as needed.   Use the cream as needed.

## 2024-10-24 NOTE — Progress Notes (Signed)
"       ° °  [name], PhD, LAT, ATC acting as a scribe for Artist Lloyd, MD.  Rhonda Morrow is a 63 y.o. female who presents to [facility] today for 29-month f/u R shoulder and R knee pain. Pt was last seen by Dr. Lloyd on 09/24/24 and was advised to use compression and was referred to PT, completing 6 visits.  Today, pt reports R shoulder is still hurting. She notes needed to do some more HEP. Her R knee feels weak. Pt stated that she graduated from PT. She would like to do some more PT and/or get insurance coverage for a [employer]   She also requests a refill of her tramadol .   Dx testing: 09/24/24 R knee & R shoulder XR  Pertinent review of systems: No fevers or chills  Relevant historical information: Hypertension   Exam:  BP 126/82   Pulse (!) 54   Ht 5' 4 (1.626 m)   Wt 203 lb (92.1 kg)   LMP 05/20/2012   SpO2 97%   BMI 34.84 kg/m  General: Well Developed, well nourished, and in no acute distress.   MSK: Right knee normal motion and intact strength.    Lab and Radiology Results No results found. However, due to the size of the patient record, not all encounters were searched. Please check Results Review for a complete set of results. No results found.     Assessment and Plan: 63 y.o. female with improved knee pain from DJD with physical therapy.  Plan to continue home exercise program.  Agree that gym would be helpful. Limited tramadol  refilled.  Triamcinolone  prescribed for dry skin   PDMP reviewed during this encounter. No orders of the defined types were placed in this encounter.  Meds ordered this encounter  Medications   triamcinolone  cream (KENALOG ) 0.1 %    Sig: Apply 1 Application topically 2 (two) times daily.    Dispense:  453.6 g    Refill:  12   traMADol  (ULTRAM ) 50 MG tablet    Sig: Take 1-2 tablets (50-100 mg total) by mouth daily as needed.    Dispense:  20 tablet    Refill:  0     Discussed  warning signs or symptoms. Please see discharge instructions. Patient expresses understanding.   The above documentation has been reviewed and is accurate and complete Artist Lloyd, M.D.   "

## 2024-10-27 ENCOUNTER — Other Ambulatory Visit: Payer: Self-pay | Admitting: Adult Health

## 2024-10-27 NOTE — Telephone Encounter (Unsigned)
 Copied from CRM #[id number]. Topic: Clinical - Medication Refill >> Oct 27, 2024  1:42 PM [name] wrote: Medication: BREZTRI  AEROSPHERE 160-9-4.8 MCG/ACT AERO (would like a coupon this medication)  Has the patient contacted their pharmacy? No (old prescription) (Agent: If no, request that the patient contact the pharmacy for the refill. If patient does not wish to contact the pharmacy document the reason why and proceed with request.) (Agent: If yes, when and what did the pharmacy advise?)  This is the patient's preferred pharmacy:  [id number] [address], [place] - [address] [place] [address] Phone: [phone] Fax: [phone]  Is this the correct pharmacy for this prescription? Yes If no, delete pharmacy and type the correct one.   Has the prescription been filled recently? No  Is the patient out of the medication? Yes  Has the patient been seen for an appointment in the last year OR does the patient have an upcoming appointment? Yes  Can we respond through MyChart? Yes  Agent: Please be advised that Rx refills may take up to 3 business days. We ask that you follow-up with your pharmacy.

## 2024-10-28 MED ORDER — TRELEGY ELLIPTA 100-62.5-25 MCG/ACT IN AEPB: 1.0000 | INHALATION_SPRAY | Freq: Every day | RESPIRATORY_TRACT | 5 refills | Status: DC

## 2024-11-03 ENCOUNTER — Ambulatory Visit: Admitting: Family Medicine

## 2024-11-05 NOTE — Progress Notes (Signed)
 "  New Patient Pulmonology Office Visit   Subjective:  Patient ID: Rhonda Morrow, female    DOB: 1961/03/13  MRN: [id number]  Referred by: Antonio Cyndee Jamee JONELLE, DO  CC: No chief complaint on file.  ASTHMA OSA on CPAP HPI Rhonda Morrow is a 63 y.o. female with ***   {PULM QUESTIONNAIRES (Optional):33196}  ROS  Allergies: Losartan , Oxycodone -acetaminophen , and Augmentin  [amoxicillin -pot clavulanate] Current Medications[1] Past Medical History:  Diagnosis Date   Arthritis    Asthma    Complication of anesthesia    one time woke up and was vey scared,17 yrs ago   Deaf    Depression    Diabetes mellitus without complication (HCC)    GERD (gastroesophageal reflux disease)    Hypertension    Left groin pain    Nausea with vomiting 02/19/2014   Thyroid  disease    Past Surgical History:  Procedure Laterality Date   CARDIAC CATHETERIZATION     CESAREAN SECTION     CHOLECYSTECTOMY N/A 06/20/2016   Procedure: LAPAROSCOPIC CHOLECYSTECTOMY;  Surgeon: Donnice Bury, MD;  Location: [facility] OR;  Service: General;  Laterality: N/A;   ESOPHAGEAL MANOMETRY N/A 09/29/2013   Procedure: ESOPHAGEAL MANOMETRY (EM);  Surgeon: Toribio SHAUNNA Cedar, MD;  Location: [facility] ENDOSCOPY;  Service: Endoscopy;  Laterality: N/A;   TOTAL HIP ARTHROPLASTY Left 02/21/2017   Procedure: LEFT TOTAL HIP ARTHROPLASTY ANTERIOR APPROACH;  Surgeon: Kay CHRISTELLA Cummins, MD;  Location: [facility] OR;  Service: Orthopedics;  Laterality: Left;   TOTAL HIP ARTHROPLASTY Right 08/14/2024   Procedure: ARTHROPLASTY, HIP, TOTAL, ANTERIOR APPROACH;  Surgeon: Cummins Kay CHRISTELLA, MD;  Location: [facility] ORS;  Service: Orthopedics;  Laterality: Right;   Family History  Problem Relation Age of Onset   Diabetes Mother    Heart disease Mother    Diabetes Father    Colon cancer Neg Hx    Colon polyps Neg Hx    Esophageal cancer Neg Hx    Rectal cancer Neg Hx    Stomach cancer Neg Hx    Social History   Socioeconomic History   Marital status: Married     Spouse name: Not on file   Number of children: Not on file   Years of education: Not on file   Highest education level: 12th grade  Occupational History   Occupation: disabled  Tobacco Use   Smoking status: Never   Smokeless tobacco: Never  Vaping Use   Vaping status: Never Used  Substance and Sexual Activity   Alcohol  use: Yes    Alcohol /week: 1.0 standard drink of alcohol     Types: 1 Glasses of wine per week    Comment: occas   Drug use: No   Sexual activity: Not Currently  Other Topics Concern   Not on file  Social History Narrative   Lives with family.  Has 3 children.  Works for [employer]  Education: high school.   Social Drivers of Health   Tobacco Use: Low Risk (10/23/2024)   Patient History    Smoking Tobacco Use: Never    Smokeless Tobacco Use: Never    Passive Exposure: Not on file  Financial Resource Strain: Low Risk (10/21/2024)   Overall Financial Resource Strain (CARDIA)    Difficulty of Paying Living Expenses: Not very hard  Food Insecurity: Food Insecurity Present (10/21/2024)   Epic    Worried About Programme Researcher, Broadcasting/film/video in the Last Year: Sometimes true    Ran Out of Food in the Last Year: Sometimes true  Transportation Needs: Unmet Transportation Needs (10/21/2024)   Epic    Lack of Transportation (Medical): Yes    Lack of Transportation (Non-Medical): Yes  Physical Activity: Sufficiently Active (10/21/2024)   Exercise Vital Sign    Days of Exercise per Week: 5 days    Minutes of Exercise per Session: 40 min  Stress: Stress Concern Present (10/21/2024)   [employer] of Occupational Health - Occupational Stress Questionnaire    Feeling of Stress: To some extent  Social Connections: Socially Integrated (10/21/2024)   Social Connection and Isolation Panel    Frequency of Communication with Friends and Family: Once a week    Frequency of Social Gatherings with Friends and Family: Twice a week    Attends Religious Services: 1 to 4 times per year     Active Member of [employer] or Organizations: Yes    Attends Banker Meetings: 1 to 4 times per year    Marital Status: Married  Catering Manager Violence: Not At Risk (08/14/2024)   Epic    Fear of Current or Ex-Partner: No    Emotionally Abused: No    Physically Abused: No    Sexually Abused: No  Depression (PHQ2-9): Medium Risk (06/13/2024)   Depression (PHQ2-9)    PHQ-2 Score: 6  Alcohol  Screen: Low Risk (10/21/2024)   Alcohol  Screen    Last Alcohol  Screening Score (AUDIT): 1  Housing: Unknown (10/21/2024)   Epic    Unable to Pay for Housing in the Last Year: No    Number of Times Moved in the Last Year: Not on file    Homeless in the Last Year: No  Utilities: Not At Risk (08/14/2024)   Epic    Threatened with loss of utilities: No  Health Literacy: Not on file       Objective:  LMP 05/20/2012  {Pulm Vitals (Optional):32837}  Physical Exam  Diagnostic Review:  {Labs (Optional):32838}  Echo  06/2024 EF 55-60%. Grade 1 diastolic dysfunction. Normal RV No significant valvulopathy    CXR 10/07/24 No acute abnormality. Stable mild cardiomegaly and mild chronic bronchitic changes.   Assessment & Plan:   Assessment & Plan  moderate persistent asthma   Asthma is moderate and persistent with a slight decrease in pulmonary function compared to the previous testing  complicated by hip pain during testing. No wheezing detected. Breathing difficulties are occasionally managed with an emergency inhaler. Continue Trelegy inhaler once daily. Ensure use of Trelegy inhaler on the Rhonda of surgery. Overall appears clinically stable. Chest xray May clear lung in never smoker    Mild OSA - CPAP intolerant. Positional sleep with head of bed elevated at 30 degrees.   No follow-ups on file.    Marny Patch, MD Pulmonary and Critical Care Medicine [facility]    [1]  Current Outpatient Medications:    albuterol  (VENTOLIN  HFA) 108 (90 Base)  MCG/ACT inhaler, Inhale 1-2 puffs into the lungs every 6 (six) hours as needed for wheezing or shortness of breath., Disp: 18 each, Rfl: 3   allopurinol  (ZYLOPRIM ) 100 MG tablet, Take 1 tablet (100 mg total) by mouth daily., Disp: 90 tablet, Rfl: 0   amLODipine  (NORVASC ) 5 MG tablet, TAKE 1 TABLET (5 MG TOTAL) BY MOUTH DAILY., Disp: 90 tablet, Rfl: 0   ASPIRIN  LOW DOSE 81 MG chewable tablet, CHEW 1 TABLET BY MOUTH 2 TIMES DAILY. TO BE TAKEN AFTER SURGERY TO PREVENT BLOOD CLOTS, Disp: 180 tablet, Rfl: 1   atorvastatin  (LIPITOR) 20 MG  tablet, TAKE 1 TABLET BY MOUTH EVERY DAY, Disp: 90 tablet, Rfl: 1   Azelastine  HCl 137 MCG/SPRAY SOLN, PLACE 2 SPRAYS INTO BOTH NOSTRILS 2 (TWO) TIMES DAILY. USE IN EACH NOSTRIL AS DIRECTED (Patient taking differently: as needed.), Disp: 30 mL, Rfl: 3   blood glucose meter kit and supplies KIT, Dispense based on patient and insurance preference. Use up to four times daily as directed. (FOR ICD-9 250.00, 250.01)., Disp: 1 each, Rfl: 0   cetirizine  (ZYRTEC ) 10 MG tablet, TAKE 1 TABLET BY MOUTH EVERY DAY, Disp: 90 tablet, Rfl: 2   docusate sodium  (COLACE) 100 MG capsule, TAKE 1 CAPSULE BY MOUTH DAILY AS NEEDED., Disp: 30 capsule, Rfl: 2   fluticasone  (FLONASE ) 50 MCG/ACT nasal spray, Place 2 sprays into both nostrils daily. (Patient taking differently: Place 2 sprays into both nostrils daily as needed for allergies.), Disp: 16 g, Rfl: 6   Fluticasone -Umeclidin-Vilant (TRELEGY ELLIPTA ) 100-62.5-25 MCG/ACT AEPB, Inhale 1 puff into the lungs daily., Disp: 1 each, Rfl: 5   meclizine  (ANTIVERT ) 25 MG tablet, Take 1 tablet (25 mg total) by mouth 3 (three) times daily as needed for dizziness., Disp: 20 tablet, Rfl: 0   metFORMIN  (GLUCOPHAGE -XR) 500 MG 24 hr tablet, Take 2 tablets (1,000 mg total) by mouth daily with breakfast., Disp: 180 tablet, Rfl: 1   methocarbamol  (ROBAXIN ) 500 MG tablet, Take 1 tablet (500 mg total) by mouth 2 (two) times daily as needed., Disp: 20 tablet, Rfl:  0   methocarbamol  (ROBAXIN ) 750 MG tablet, Take 1 tablet (750 mg total) by mouth 3 (three) times daily as needed., Disp: 30 tablet, Rfl: 2   Na Sulfate-K Sulfate-Mg Sulfate concentrate (SUPREP) 17.5-3.13-1.6 GM/177ML SOLN, Take 1 kit by mouth See admin instructions., Disp: , Rfl:    nystatin  (MYCOSTATIN /NYSTOP ) powder, Apply 1 Application topically 3 (three) times daily., Disp: 15 g, Rfl: 1   omeprazole  (PRILOSEC) 20 MG capsule, Take 20 mg by mouth in the Rhonda and at bedtime., Disp: , Rfl:    ondansetron  (ZOFRAN ) 4 MG tablet, Take 1 tablet (4 mg total) by mouth every 8 (eight) hours as needed for nausea or vomiting., Disp: 40 tablet, Rfl: 0   potassium chloride  (KLOR-CON ) 10 MEQ tablet, Take 1 tablet (10 mEq total) by mouth 2 (two) times daily., Disp: 180 tablet, Rfl: 1   promethazine -dextromethorphan (PROMETHAZINE -DM) 6.25-15 MG/5ML syrup, Take 5 mLs by mouth 4 (four) times daily as needed for cough., Disp: 180 mL, Rfl: 0   Semaglutide  (RYBELSUS ) 3 MG TABS, Take 3 mg by mouth daily., Disp: , Rfl:    Semaglutide  (RYBELSUS ) 7 MG TABS, Take 1 tablet (7 mg total) by mouth daily., Disp: 90 tablet, Rfl: 1   solifenacin  (VESICARE ) 5 MG tablet, TAKE 1 TABLET (5 MG TOTAL) BY MOUTH DAILY., Disp: 90 tablet, Rfl: 1   traMADol  (ULTRAM ) 50 MG tablet, Take 1-2 tablets (50-100 mg total) by mouth daily as needed., Disp: 20 tablet, Rfl: 0   triamcinolone  cream (KENALOG ) 0.1 %, Apply 1 Application topically 2 (two) times daily., Disp: 453.6 g, Rfl: 12  Current Facility-Administered Medications:    alum & mag hydroxide-simeth (MAALOX/MYLANTA) 200-200-20 MG/5ML suspension 30 mL, 30 mL, Oral, Once, Saguier, [place], PA-C   lidocaine  (XYLOCAINE ) 2 % viscous mouth solution 15 mL, 15 mL, Mouth/Throat, Once, Saguier, Edward, PA-C   nitroGLYCERIN  (NITRODUR - Dosed in mg/24 hr) patch 0.2 mg, 0.2 mg, Transdermal, Daily, Regal, [place] RAMAN, DPM  "

## 2024-11-07 ENCOUNTER — Other Ambulatory Visit: Payer: Self-pay

## 2024-11-07 ENCOUNTER — Ambulatory Visit

## 2024-11-07 VITALS — BP 120/54 | HR 44 | Ht 64.0 in | Wt 202.0 lb

## 2024-11-07 DIAGNOSIS — G4733 Obstructive sleep apnea (adult) (pediatric): Secondary | ICD-10-CM | POA: Diagnosis not present

## 2024-11-07 DIAGNOSIS — J4540 Moderate persistent asthma, uncomplicated: Secondary | ICD-10-CM | POA: Diagnosis not present

## 2024-11-07 MED ORDER — SPIRIVA RESPIMAT 2.5 MCG/ACT IN AERS: 2.0000 | INHALATION_SPRAY | Freq: Every day | RESPIRATORY_TRACT | 8 refills | Status: AC

## 2024-11-07 MED ORDER — ALBUTEROL SULFATE (2.5 MG/3ML) 0.083% IN NEBU: 2.5000 mg | INHALATION_SOLUTION | Freq: Four times a day (QID) | RESPIRATORY_TRACT | Status: AC | PRN

## 2024-11-07 MED ORDER — BREZTRI AEROSPHERE 160-9-4.8 MCG/ACT IN AERO: INHALATION_SPRAY | RESPIRATORY_TRACT | Status: AC

## 2024-11-07 MED ORDER — BUDESONIDE-FORMOTEROL FUMARATE 160-4.5 MCG/ACT IN AERO: 2.0000 | INHALATION_SPRAY | Freq: Two times a day (BID) | RESPIRATORY_TRACT | 8 refills | Status: AC

## 2024-11-07 MED ORDER — ALBUTEROL SULFATE HFA 108 (90 BASE) MCG/ACT IN AERS: 1.0000 | INHALATION_SPRAY | Freq: Four times a day (QID) | RESPIRATORY_TRACT | 10 refills | Status: AC | PRN

## 2024-11-07 NOTE — Patient Instructions (Addendum)
 Dear Ms. Delores;  Given that Trelegy is not working for you I will change your inhalers to: -Spiriva 2 puffs in the morning daily -Symbicort  2 puffs twice a day. Please rinse your mouth to avoid thrush.  -I am sending you a nebulizer machine, you can use it 4-6 times a day as need it for shortness of breath.  -Given your pulmonary function test, I will order a CT scan of your lungs to rule out other conditions.   I will see you in 3 months

## 2024-11-11 ENCOUNTER — Ambulatory Visit: Admitting: Family Medicine

## 2024-11-11 ENCOUNTER — Ambulatory Visit (INDEPENDENT_AMBULATORY_CARE_PROVIDER_SITE_OTHER): Admitting: Physician Assistant

## 2024-11-11 DIAGNOSIS — Z96641 Presence of right artificial hip joint: Secondary | ICD-10-CM

## 2024-11-11 NOTE — Progress Notes (Signed)
 "  Post-Op Visit Note   Patient: Rhonda Morrow           Date of Birth: 04-Jan-1961           MRN: [id number] Visit Date: 11/11/2024 PCP: Antonio Cyndee Jamee JONELLE, DO   Assessment & Plan:  Chief Complaint:  Chief Complaint  Patient presents with   Right Hip - Follow-up    Right THA 08/14/2024   Visit Diagnoses:  1. Status post total replacement of right hip     Plan: Patient is a pleasant 64 year old hearing-impaired female here with an interpreter who comes in today 3 months status post right total hip replacement.  She has been doing fairly well.  She does note occasional pain mostly at night which is relieved with tramadol .  She has recently finished physical [facility] on [facility].  Lamination right hip reveals minimal discomfort with logroll.  She has slightly limited hip flexion secondary to weakness.  She is neurovascularly intact distally.  At this point, she will continue with her exercises.  Follow-up in 3 months for repeat evaluation and AP pelvis x-rays.  Call with concerns or questions.  Follow-Up Instructions: Return in about 3 months (around 02/09/2025).   Orders:  No orders of the defined types were placed in this encounter.  No orders of the defined types were placed in this encounter.   Imaging: No new imaging  PMFS History: Patient Active Problem List   Diagnosis Date Noted   Status post total replacement of right hip 08/14/2024   Gout 06/02/2024   Hearing loss 06/02/2024   Mastoid disorder, unspecified laterality 04/01/2024   Irritable bowel syndrome with both constipation and diarrhea 07/20/2023   Urinary incontinence 01/31/2023   Vaginal itching 01/31/2023   Influenza 11/20/2022   Postmenopausal bleeding 08/18/2022   Acute non-recurrent pansinusitis 06/18/2022   Leg cramps 03/08/2022   Muscle spasm of right lower extremity 03/08/2022   Unilateral primary osteoarthritis,  right hip 03/01/2022   ACE inhibitor intolerance 01/19/2022   Weakness 12/22/2021   Left lower quadrant abdominal pain 12/22/2021   Slow transit constipation 12/22/2021   Class 3 severe obesity due to excess calories without serious comorbidity with body mass index (BMI) of 40.0 to 44.9 in adult (HCC) 07/05/2021   Restless leg syndrome 07/05/2021   Chronic seasonal allergic rhinitis due to pollen 07/05/2021   Psychophysiological insomnia 07/05/2021   Non-compliance 07/05/2021   COVID-19 07/05/2021   Right wrist pain 06/05/2021   Acute pain of left shoulder 01/24/2021   Acute pain of right shoulder 01/24/2021   Foot pain, bilateral 01/24/2021   Dyspepsia 01/24/2021   Preop cardiovascular exam 01/24/2021   Right hand pain 12/29/2020   OSA (obstructive sleep apnea) 09/12/2020   Nocturia more than twice per night 08/03/2020   Non-restorative sleep 08/03/2020   Leg cramping 08/03/2020   Hyperlipidemia associated with type 2 diabetes mellitus (HCC) 07/25/2020   Large breasts 07/22/2020   RLS (restless legs syndrome) 05/17/2020   Cough 04/01/2020   Asthma, moderate persistent, poorly-controlled 04/01/2020   Medication management 04/01/2020   Menopausal symptoms 03/29/2020   Hot flashes 03/29/2020   Chronic right shoulder pain 08/11/2019   Acute non-recurrent maxillary sinusitis 08/11/2019   BMI 40.0-44.9, adult (HCC) 01/22/2019   Right lower quadrant abdominal pain 10/20/2018   Rectal pain 10/20/2018   Vitamin B 12 deficiency 03/28/2018   B12 deficiency 03/28/2018   DM (diabetes  mellitus) type II uncontrolled, periph vascular disorder 03/28/2018   Hyperlipidemia 03/28/2018   Primary osteoarthritis of left hip 02/21/2017   Status post total hip replacement, left 02/21/2017   History of laparoscopic cholecystectomy 06/20/2016   Cerumen impaction 06/09/2016   Hypersomnia 05/16/2016   Knee pain, right 05/05/2016   RUQ pain 04/09/2016   Abnormal CT scan 03/28/2016   Dyspnea and  respiratory abnormalities 03/15/2016   Asthma with acute exacerbation 03/15/2016   Asthma in adult 02/25/2016   DM (diabetes mellitus) type II controlled, neurological manifestation (HCC) 02/09/2016   Right hip pain 12/23/2015   Right hamstring muscle strain 11/18/2015   Abdominal pain, acute 09/01/2015   Acute asthma exacerbation 04/16/2015   Acute bronchitis 04/14/2015   Pap smear for cervical cancer screening 09/14/2014   Bed bug bite 05/06/2014   Allergic rhinitis 04/09/2014   Rectal itching 04/09/2014   Bladder spasm 04/09/2014   Knee pain, left 03/05/2014   Hypokalemia 02/19/2014   Nausea with vomiting 02/19/2014   Diabetes mellitus, type II (HCC) 02/19/2014   Edema 02/16/2014   Disorder of rotator cuff 11/12/2013   Routine general medical examination at a health care facility 08/20/2013   Gastroesophageal reflux disease 06/26/2013   Dysphagia, pharyngoesophageal phase 06/26/2013   Suprapubic abdominal pain 05/12/2013   Medication side effect 03/25/2013   Diarrhea 03/25/2013   Benign positional vertigo 01/30/2013   Traumatic hematoma of thigh 01/15/2013   HTN (hypertension) 09/02/2012   Dry skin 08/22/2012   External hemorrhoids 08/22/2012   Obesity 06/30/2012   Thyromegaly 05/22/2012   Back pain 05/22/2012   Elevated glucose 05/22/2012   Moderate persistent asthma 04/19/2012   Dyspnea 01/28/2012   Past Medical History:  Diagnosis Date   Arthritis    Asthma    Complication of anesthesia    one time woke up and was vey scared,17 yrs ago   Deaf    Depression    Diabetes mellitus without complication (HCC)    GERD (gastroesophageal reflux disease)    Hypertension    Left groin pain    Nausea with vomiting 02/19/2014   Thyroid  disease     Family History  Problem Relation Age of Onset   Diabetes Mother    Heart disease Mother    Diabetes Father    Colon cancer Neg Hx    Colon polyps Neg Hx    Esophageal cancer Neg Hx    Rectal cancer Neg Hx    Stomach  cancer Neg Hx     Past Surgical History:  Procedure Laterality Date   CARDIAC CATHETERIZATION     CESAREAN SECTION     CHOLECYSTECTOMY N/A 06/20/2016   Procedure: LAPAROSCOPIC CHOLECYSTECTOMY;  Surgeon: Donnice Bury, MD;  Location: [facility] OR;  Service: General;  Laterality: N/A;   ESOPHAGEAL MANOMETRY N/A 09/29/2013   Procedure: ESOPHAGEAL MANOMETRY (EM);  Surgeon: Toribio SHAUNNA Cedar, MD;  Location: [facility];  Service: Endoscopy;  Laterality: N/A;   TOTAL HIP ARTHROPLASTY Left 02/21/2017   Procedure: LEFT TOTAL HIP ARTHROPLASTY ANTERIOR APPROACH;  Surgeon: Kay CHRISTELLA Cummins, MD;  Location: [facility] OR;  Service: Orthopedics;  Laterality: Left;   TOTAL HIP ARTHROPLASTY Right 08/14/2024   Procedure: ARTHROPLASTY, HIP, TOTAL, ANTERIOR APPROACH;  Surgeon: Cummins Kay CHRISTELLA, MD;  Location: [facility];  Service: Orthopedics;  Laterality: Right;   Social History   Occupational History   Occupation: disabled  Tobacco Use   Smoking status: Never   Smokeless tobacco: Never  Vaping Use   Vaping status: Never Used  Substance and Sexual Activity   Alcohol  use: Yes    Alcohol /week: 1.0 standard drink of alcohol     Types: 1 Glasses of wine per week    Comment: occas   Drug use: No   Sexual activity: Not Currently     "

## 2024-11-13 ENCOUNTER — Telehealth: Payer: Self-pay

## 2024-11-13 DIAGNOSIS — J4540 Moderate persistent asthma, uncomplicated: Secondary | ICD-10-CM

## 2024-11-13 NOTE — Telephone Encounter (Signed)
 Copied from CRM [id number]. Topic: General - Other >> Nov 11, 2024 12:15 PM Devaughn RAMAN wrote: Reason for CRM: Pt is calling regarding nebulizer machine, pt stated she needs it to be redelivered as she was unable to get the delivery at the time, pt also needs the information for the company who is supposed to deliver the nebulizer machine.   Sending Mychart message.

## 2024-11-18 ENCOUNTER — Ambulatory Visit (HOSPITAL_BASED_OUTPATIENT_CLINIC_OR_DEPARTMENT_OTHER)
Admission: RE | Admit: 2024-11-18 | Discharge: 2024-11-18 | Disposition: A | Discharge status: Home / Self Care | Source: Ambulatory Visit | Attending: Family Medicine | Admitting: Family Medicine

## 2024-11-18 ENCOUNTER — Other Ambulatory Visit: Payer: Self-pay | Admitting: Family Medicine

## 2024-11-18 ENCOUNTER — Ambulatory Visit: Payer: Self-pay | Admitting: Family Medicine

## 2024-11-18 ENCOUNTER — Encounter: Payer: Self-pay | Admitting: Family Medicine

## 2024-11-18 ENCOUNTER — Ambulatory Visit: Admitting: Family Medicine

## 2024-11-18 VITALS — BP 118/70 | HR 45 | Temp 97.9°F | Resp 18 | Ht 64.0 in | Wt 196.6 lb

## 2024-11-18 DIAGNOSIS — K5903 Drug induced constipation: Secondary | ICD-10-CM

## 2024-11-18 DIAGNOSIS — T402X5A Adverse effect of other opioids, initial encounter: Secondary | ICD-10-CM | POA: Diagnosis not present

## 2024-11-18 DIAGNOSIS — J069 Acute upper respiratory infection, unspecified: Secondary | ICD-10-CM

## 2024-11-18 DIAGNOSIS — J40 Bronchitis, not specified as acute or chronic: Secondary | ICD-10-CM

## 2024-11-18 MED ORDER — FLUTICASONE PROPIONATE 50 MCG/ACT NA SUSP: 2.0000 | Freq: Every day | NASAL | 6 refills | Status: AC

## 2024-11-18 MED ORDER — AZITHROMYCIN 250 MG PO TABS: ORAL_TABLET | ORAL | 0 refills | Status: AC

## 2024-11-18 MED ORDER — PROMETHAZINE-DM 6.25-15 MG/5ML PO SYRP: 5.0000 mL | ORAL_SOLUTION | Freq: Four times a day (QID) | ORAL | 0 refills | Status: AC | PRN

## 2024-11-18 MED ORDER — ALBUTEROL SULFATE (2.5 MG/3ML) 0.083% IN NEBU: 2.5000 mg | INHALATION_SOLUTION | Freq: Four times a day (QID) | RESPIRATORY_TRACT | 1 refills | Status: DC | PRN

## 2024-11-18 NOTE — Patient Instructions (Signed)

## 2024-11-18 NOTE — Progress Notes (Signed)
 "  Subjective:    Patient ID: Rhonda Morrow, female    DOB: 1961-09-02, 64 y.o.   MRN: [id number]  Chief Complaint  Patient presents with   Cough    Sxs started over the weekend, coughing hard that ribs hurt, pro cough, pt states taking robitussin. No testing for covid or flu at home. Pt states sxs have improved more concerned with the rib pain from coughing so much    HPI Patient is in today for cough and congestion.  Discussed the use of AI scribe software for clinical note transcription with the patient, who gave verbal consent to proceed.  History of Present Illness Rhonda Morrow is a 64 year old female who presents with a persistent cough and cold symptoms.  She has been experiencing symptoms since Saturday, which she attributes to a cold spread by her son. Her mother is also experiencing similar symptoms. She has not had a fever, as she does not have a thermometer at home.  She manages her symptoms with Robitussin during the day and Delsym at night. She uses Vicks for her persistent cough, which is affecting her sleep. The cough is described as causing irritability, and she noticed a small amount of yellow sputum yesterday.  Her left ear was initially affected but has since improved. She uses an inhaler to help with her breathing and is awaiting medication for her nebulizer from the pharmacy.  She is currently using Breztri , provided as a sample by her pulmonary doctor, and is awaiting Symbicort . She also uses an albuterol  inhaler for emergencies.  She experiences hip pain, for which she takes tramadol  daily. This medication has caused constipation, and she is considering using prunes or Senokot to alleviate this issue. She had hip surgery in October and is under the care of an orthopedic specialist, with a follow-up scheduled for March.    Past Medical History:  Diagnosis Date   Arthritis    Asthma    Complication of anesthesia    one time woke up and was vey scared,17 yrs ago    Deaf    Depression    Diabetes mellitus without complication (HCC)    GERD (gastroesophageal reflux disease)    Hypertension    Left groin pain    Nausea with vomiting 02/19/2014   Thyroid  disease     Past Surgical History:  Procedure Laterality Date   CARDIAC CATHETERIZATION     CESAREAN SECTION     CHOLECYSTECTOMY N/A 06/20/2016   Procedure: LAPAROSCOPIC CHOLECYSTECTOMY;  Surgeon: Donnice Bury, MD;  Location: [facility] OR;  Service: General;  Laterality: N/A;   ESOPHAGEAL MANOMETRY N/A 09/29/2013   Procedure: ESOPHAGEAL MANOMETRY (EM);  Surgeon: Toribio SHAUNNA Cedar, MD;  Location: [facility]OSCOPY;  Service: Endoscopy;  Laterality: N/A;   TOTAL HIP ARTHROPLASTY Left 02/21/2017   Procedure: LEFT TOTAL HIP ARTHROPLASTY ANTERIOR APPROACH;  Surgeon: Kay CHRISTELLA Cummins, MD;  Location: [facility] OR;  Service: Orthopedics;  Laterality: Left;   TOTAL HIP ARTHROPLASTY Right 08/14/2024   Procedure: ARTHROPLASTY, HIP, TOTAL, ANTERIOR APPROACH;  Surgeon: Cummins Kay CHRISTELLA, MD;  Location: [facility] ORS;  Service: Orthopedics;  Laterality: Right;    Family History  Problem Relation Age of Onset   Diabetes Mother    Heart disease Mother    Diabetes Father    Colon cancer Neg Hx    Colon polyps Neg Hx    Esophageal cancer Neg Hx    Rectal cancer Neg Hx    Stomach cancer Neg Hx  Social History   Socioeconomic History   Marital status: Married    Spouse name: Not on file   Number of children: Not on file   Years of education: Not on file   Highest education level: 12th grade  Occupational History   Occupation: disabled  Tobacco Use   Smoking status: Never   Smokeless tobacco: Never  Vaping Use   Vaping status: Never Used  Substance and Sexual Activity   Alcohol  use: Yes    Alcohol /week: 1.0 standard drink of alcohol     Types: 1 Glasses of wine per week    Comment: occas   Drug use: No   Sexual activity: Not Currently  Other Topics Concern   Not on file  Social History Narrative   Lives with family.   Has 3 children.  Works for [employer]  Education: high school.   Social Drivers of Health   Tobacco Use: Low Risk (11/18/2024)   Patient History    Smoking Tobacco Use: Never    Smokeless Tobacco Use: Never    Passive Exposure: Not on file  Financial Resource Strain: Low Risk (10/21/2024)   Overall Financial Resource Strain (CARDIA)    Difficulty of Paying Living Expenses: Not very hard  Food Insecurity: Food Insecurity Present (10/21/2024)   Epic    Worried About Programme Researcher, Broadcasting/film/video in the Last Year: Sometimes true    Ran Out of Food in the Last Year: Sometimes true  Transportation Needs: Unmet Transportation Needs (10/21/2024)   Epic    Lack of Transportation (Medical): Yes    Lack of Transportation (Non-Medical): Yes  Physical Activity: Sufficiently Active (10/21/2024)   Exercise Vital Sign    Days of Exercise per Week: 5 days    Minutes of Exercise per Session: 40 min  Stress: Stress Concern Present (10/21/2024)   [employer] of Occupational Health - Occupational Stress Questionnaire    Feeling of Stress: To some extent  Social Connections: Socially Integrated (10/21/2024)   Social Connection and Isolation Panel    Frequency of Communication with Friends and Family: Once a week    Frequency of Social Gatherings with Friends and Family: Twice a week    Attends Religious Services: 1 to 4 times per year    Active Member of [employer] or Organizations: Yes    Attends Banker Meetings: 1 to 4 times per year    Marital Status: Married  Catering Manager Violence: Not At Risk (08/14/2024)   Epic    Fear of Current or Ex-Partner: No    Emotionally Abused: No    Physically Abused: No    Sexually Abused: No  Depression (PHQ2-9): Medium Risk (06/13/2024)   Depression (PHQ2-9)    PHQ-2 Score: 6  Alcohol  Screen: Low Risk (10/21/2024)   Alcohol  Screen    Last Alcohol  Screening Score (AUDIT): 1  Housing: Unknown (10/21/2024)   Epic    Unable to Pay for Housing in the  Last Year: No    Number of Times Moved in the Last Year: Not on file    Homeless in the Last Year: No  Utilities: Not At Risk (08/14/2024)   Epic    Threatened with loss of utilities: No  Health Literacy: Not on file    Outpatient Medications Prior to Visit  Medication Sig Dispense Refill   albuterol  (VENTOLIN  HFA) 108 (90 Base) MCG/ACT inhaler Inhale 1-2 puffs into the lungs every 6 (six) hours as needed for wheezing or shortness of breath. 18 each 10  allopurinol  (ZYLOPRIM ) 100 MG tablet Take 1 tablet (100 mg total) by mouth daily. 90 tablet 0   amLODipine  (NORVASC ) 5 MG tablet TAKE 1 TABLET (5 MG TOTAL) BY MOUTH DAILY. 90 tablet 0   ASPIRIN  LOW DOSE 81 MG chewable tablet CHEW 1 TABLET BY MOUTH 2 TIMES DAILY. TO BE TAKEN AFTER SURGERY TO PREVENT BLOOD CLOTS 180 tablet 1   atorvastatin  (LIPITOR) 20 MG tablet TAKE 1 TABLET BY MOUTH EVERY DAY 90 tablet 1   Azelastine  HCl 137 MCG/SPRAY SOLN PLACE 2 SPRAYS INTO BOTH NOSTRILS 2 (TWO) TIMES DAILY. USE IN EACH NOSTRIL AS DIRECTED (Patient taking differently: as needed.) 30 mL 3   blood glucose meter kit and supplies KIT Dispense based on patient and insurance preference. Use up to four times daily as directed. (FOR ICD-9 250.00, 250.01). 1 each 0   budesonide -formoterol  (SYMBICORT ) 160-4.5 MCG/ACT inhaler Inhale 2 puffs into the lungs 2 (two) times daily. 10.2 g 8   budesonide -glycopyrrolate -formoterol  (BREZTRI  AEROSPHERE) 160-9-4.8 MCG/ACT AERO inhaler 4 samples     cetirizine  (ZYRTEC ) 10 MG tablet TAKE 1 TABLET BY MOUTH EVERY DAY 90 tablet 2   docusate sodium  (COLACE) 100 MG capsule TAKE 1 CAPSULE BY MOUTH DAILY AS NEEDED. 30 capsule 2   meclizine  (ANTIVERT ) 25 MG tablet Take 1 tablet (25 mg total) by mouth 3 (three) times daily as needed for dizziness. 20 tablet 0   metFORMIN  (GLUCOPHAGE -XR) 500 MG 24 hr tablet Take 2 tablets (1,000 mg total) by mouth daily with breakfast. 180 tablet 1   methocarbamol  (ROBAXIN ) 500 MG tablet Take 1 tablet (500  mg total) by mouth 2 (two) times daily as needed. 20 tablet 0   methocarbamol  (ROBAXIN ) 750 MG tablet Take 1 tablet (750 mg total) by mouth 3 (three) times daily as needed. 30 tablet 2   Na Sulfate-K Sulfate-Mg Sulfate concentrate (SUPREP) 17.5-3.13-1.6 GM/177ML SOLN Take 1 kit by mouth See admin instructions.     nystatin  (MYCOSTATIN /NYSTOP ) powder Apply 1 Application topically 3 (three) times daily. 15 g 1   omeprazole  (PRILOSEC) 20 MG capsule Take 20 mg by mouth in the morning and at bedtime.     ondansetron  (ZOFRAN ) 4 MG tablet Take 1 tablet (4 mg total) by mouth every 8 (eight) hours as needed for nausea or vomiting. 40 tablet 0   potassium chloride  (KLOR-CON ) 10 MEQ tablet Take 1 tablet (10 mEq total) by mouth 2 (two) times daily. 180 tablet 1   promethazine -dextromethorphan (PROMETHAZINE -DM) 6.25-15 MG/5ML syrup Take 5 mLs by mouth 4 (four) times daily as needed for cough. 180 mL 0   Semaglutide  (RYBELSUS ) 3 MG TABS Take 3 mg by mouth daily.     Semaglutide  (RYBELSUS ) 7 MG TABS Take 1 tablet (7 mg total) by mouth daily. 90 tablet 1   solifenacin  (VESICARE ) 5 MG tablet TAKE 1 TABLET (5 MG TOTAL) BY MOUTH DAILY. 90 tablet 1   Tiotropium Bromide (SPIRIVA  RESPIMAT) 2.5 MCG/ACT AERS Inhale 2 puffs into the lungs daily. 2.5 g 8   traMADol  (ULTRAM ) 50 MG tablet Take 1-2 tablets (50-100 mg total) by mouth daily as needed. 20 tablet 0   triamcinolone  cream (KENALOG ) 0.1 % Apply 1 Application topically 2 (two) times daily. 453.6 g 12   fluticasone  (FLONASE ) 50 MCG/ACT nasal spray Place 2 sprays into both nostrils daily. (Patient taking differently: Place 2 sprays into both nostrils daily as needed for allergies.) 16 g 6   Facility-Administered Medications Prior to Visit  Medication Dose Route Frequency Provider Last Rate  Last Admin   albuterol  (PROVENTIL ) (2.5 MG/3ML) 0.083% nebulizer solution 2.5 mg  2.5 mg Nebulization Q6H PRN        alum & mag hydroxide-simeth (MAALOX/MYLANTA) 200-200-20 MG/5ML  suspension 30 mL  30 mL Oral Once Saguier, Edward, PA-C       lidocaine  (XYLOCAINE ) 2 % viscous mouth solution 15 mL  15 mL Mouth/Throat Once Saguier, Edward, PA-C       nitroGLYCERIN  (NITRODUR - Dosed in mg/24 hr) patch 0.2 mg  0.2 mg Transdermal Daily Regal, [place] RAMAN, DPM        Allergies  Allergen Reactions   Losartan  Shortness Of Breath   Oxycodone -Acetaminophen  Nausea And Vomiting   Augmentin  [Amoxicillin -Pot Clavulanate] Diarrhea    Review of Systems  Constitutional:  Negative for fever and malaise/fatigue.  HENT:  Negative for congestion.   Eyes:  Negative for blurred vision.  Respiratory:  Positive for cough and sputum production. Negative for shortness of breath.   Cardiovascular:  Negative for chest pain, palpitations and leg swelling.  Gastrointestinal:  Negative for vomiting.  Musculoskeletal:  Negative for back pain.  Skin:  Negative for rash.  Neurological:  Negative for loss of consciousness and headaches.       Objective:    Physical Exam Vitals and nursing note reviewed.  Constitutional:      General: She is not in acute distress.    Appearance: Normal appearance. She is well-developed.  HENT:     Head: Normocephalic and atraumatic.  Eyes:     General: No scleral icterus.       Right eye: No discharge.        Left eye: No discharge.  Cardiovascular:     Rate and Rhythm: Normal rate and regular rhythm.     Heart sounds: No murmur heard. Pulmonary:     Effort: Pulmonary effort is normal. No respiratory distress.     Breath sounds: Normal breath sounds. No wheezing, rhonchi or rales.  Musculoskeletal:        General: Normal range of motion.     Cervical back: Normal range of motion and neck supple.     Right lower leg: No edema.     Left lower leg: No edema.  Skin:    General: Skin is warm and dry.  Neurological:     Mental Status: She is alert and oriented to person, place, and time.  Psychiatric:        Mood and Affect: Mood normal.         Behavior: Behavior normal.        Thought Content: Thought content normal.        Judgment: Judgment normal.     BP 118/70 (BP Location: Left Arm, Patient Position: Sitting, Cuff Size: Normal)   Pulse (!) 45   Temp 97.9 F (36.6 C) (Oral)   Resp 18   Ht 5' 4 (1.626 m)   Wt 196 lb 9.6 oz (89.2 kg)   LMP 05/20/2012   SpO2 98%   BMI 33.75 kg/m  Wt Readings from Last 3 Encounters:  11/18/24 196 lb 9.6 oz (89.2 kg)  11/07/24 202 lb (91.6 kg)  10/24/24 203 lb (92.1 kg)    Diabetic Foot Exam - Simple   No data filed    Lab Results  Component Value Date   WBC 10.9 (H) 08/14/2024   HGB 13.5 08/14/2024   HCT 44.7 08/14/2024   PLT 166 08/14/2024   GLUCOSE 121 (H) 08/08/2024   CHOL 149 04/01/2024  TRIG 88.0 04/01/2024   HDL 57.00 04/01/2024   LDLCALC 74 04/01/2024   ALT 13 07/21/2024   AST 13 07/21/2024   NA 141 08/08/2024   K 3.5 08/08/2024   CL 105 08/08/2024   CREATININE 0.83 08/08/2024   BUN 16 08/08/2024   CO2 25 08/08/2024   TSH 1.23 08/18/2022   INR 1.1 (H) 04/01/2024   HGBA1C 6.9 (H) 07/21/2024   MICROALBUR <0.7 05/14/2024    Lab Results  Component Value Date   TSH 1.23 08/18/2022   Lab Results  Component Value Date   WBC 10.9 (H) 08/14/2024   HGB 13.5 08/14/2024   HCT 44.7 08/14/2024   MCV 89.2 08/14/2024   PLT 166 08/14/2024   Lab Results  Component Value Date   NA 141 08/08/2024   K 3.5 08/08/2024   CO2 25 08/08/2024   GLUCOSE 121 (H) 08/08/2024   BUN 16 08/08/2024   CREATININE 0.83 08/08/2024   BILITOT 0.5 07/21/2024   ALKPHOS 83 06/24/2024   AST 13 07/21/2024   ALT 13 07/21/2024   PROT 6.9 07/21/2024   ALBUMIN 3.7 06/24/2024   CALCIUM  9.9 08/08/2024   ANIONGAP 11 08/08/2024   EGFR 60 04/11/2022   GFR 52.78 (L) 06/24/2024   Lab Results  Component Value Date   CHOL 149 04/01/2024   Lab Results  Component Value Date   HDL 57.00 04/01/2024   Lab Results  Component Value Date   LDLCALC 74 04/01/2024   Lab Results  Component  Value Date   TRIG 88.0 04/01/2024   Lab Results  Component Value Date   CHOLHDL 3 04/01/2024   Lab Results  Component Value Date   HGBA1C 6.9 (H) 07/21/2024       Assessment & Plan:  Bronchitis -     DG Chest 2 View; Future -     Azithromycin ; Take 2 tablets on day 1, then 1 tablet daily on days 2 through 5  Dispense: 6 tablet; Refill: 0 -     Promethazine -DM; Take 5 mLs by mouth 4 (four) times daily as needed.  Dispense: 118 mL; Refill: 0  Viral upper respiratory tract infection -     Fluticasone  Propionate; Place 2 sprays into both nostrils daily.  Dispense: 16 g; Refill: 6  Other orders -     Albuterol  Sulfate; Take 3 mLs (2.5 mg total) by nebulization every 6 (six) hours as needed for wheezing or shortness of breath.  Dispense: 150 mL; Refill: 1  ASSESSMENT:Assessment and Plan Assessment & Plan Acute bronchitis and upper respiratory infection   She has acute bronchitis with a persistent cough since Saturday, producing yellow sputum. No fever is reported, but she experiences difficulty sleeping due to coughing. An inhaler provides relief. A chest x-ray is ordered to rule out pneumonia. Prescribe cough syrup for nighttime use and an antibiotic. Send a prescription for nebulizer medication to the pharmacy. Advise using either the nebulizer or albuterol  inhaler, not both simultaneously.  Constipation due to opioid therapy   Constipation is attributed to tramadol  use for chronic hip pain. She reports intermittent bowel movements and difficulty with bowel movements. Recommend Senokot as a stool softener.  Chronic hip pain following hip surgery   Chronic hip pain persists following surgery in October. Tramadol  is used for pain management, contributing to constipation. Continue tramadol  for pain management. Follow up with an orthopedic specialist in March.    Anayeli Arel R Lowne Chase, DO  "

## 2024-12-08 ENCOUNTER — Ambulatory Visit (HOSPITAL_BASED_OUTPATIENT_CLINIC_OR_DEPARTMENT_OTHER)

## 2024-12-10 ENCOUNTER — Other Ambulatory Visit: Payer: Self-pay

## 2024-12-10 MED ORDER — ALBUTEROL SULFATE (2.5 MG/3ML) 0.083% IN NEBU: 2.5000 mg | INHALATION_SOLUTION | Freq: Four times a day (QID) | RESPIRATORY_TRACT | 6 refills | Status: AC | PRN

## 2024-12-10 NOTE — Progress Notes (Signed)
Albuterol neb refilled.

## 2024-12-10 NOTE — Telephone Encounter (Signed)
 Copied from CRM #[id number]. Topic: Clinical - Medication Refill >> Dec 10, 2024 10:15 AM Corean SAUNDERS wrote: Medication: albuterol  (PROVENTIL ) (2.5 MG/3ML) 0.083% nebulizer solution 2.5 mg Tiotropium Bromide (SPIRIVA  RESPIMAT) 2.5 MCG/ACT AERS     Has the patient contacted their pharmacy? No, patient states her pharmacy hasn't received refills.  (Agent: If no, request that the patient contact the pharmacy for the refill. If patient does not wish to contact the pharmacy document the reason why and proceed with request.) (Agent: If yes, when and what did the pharmacy advise?)  This is the patient's preferred pharmacy:  [id number] [address], [place] - [address] [place] [address] Phone: [phone] Fax: [phone]    Is this the correct pharmacy for this prescription? Yes If no, delete pharmacy and type the correct one.   Has the prescription been filled recently? Yes  Is the patient out of the medication? Yes  Has the patient been seen for an appointment in the last year OR does the patient have an upcoming appointment? Yes  Can we respond through MyChart? Yes  Agent: Please be advised that Rx refills may take up to 3 business days. We ask that you follow-up with your pharmacy.    Called and spoke with the pt using the interpretor who answered her phone and explained how [facility]  should have additional refills left. I have refilled albuterol  neb solution & pt is aware. Nothing further needed.

## 2024-12-10 NOTE — Telephone Encounter (Signed)
 This encounter was created in error - please disregard.

## 2024-12-23 ENCOUNTER — Ambulatory Visit (HOSPITAL_BASED_OUTPATIENT_CLINIC_OR_DEPARTMENT_OTHER)

## 2025-02-03 ENCOUNTER — Ambulatory Visit

## 2025-02-10 ENCOUNTER — Ambulatory Visit: Admitting: Physician Assistant
